# Patient Record
Sex: Male | Born: 1955
Health system: Southern US, Community
[De-identification: ages and names within clinical notes are randomized; demographics above are authoritative.]

## PROBLEM LIST (undated history)

## (undated) DIAGNOSIS — R7303 Prediabetes: Secondary | ICD-10-CM

## (undated) DIAGNOSIS — M1990 Unspecified osteoarthritis, unspecified site: Secondary | ICD-10-CM

## (undated) DIAGNOSIS — F1410 Cocaine abuse, uncomplicated: Secondary | ICD-10-CM

## (undated) DIAGNOSIS — I5022 Chronic systolic (congestive) heart failure: Secondary | ICD-10-CM

## (undated) DIAGNOSIS — I428 Other cardiomyopathies: Secondary | ICD-10-CM

## (undated) DIAGNOSIS — I499 Cardiac arrhythmia, unspecified: Secondary | ICD-10-CM

## (undated) DIAGNOSIS — I2510 Atherosclerotic heart disease of native coronary artery without angina pectoris: Secondary | ICD-10-CM

## (undated) DIAGNOSIS — R011 Cardiac murmur, unspecified: Secondary | ICD-10-CM

## (undated) DIAGNOSIS — I10 Essential (primary) hypertension: Secondary | ICD-10-CM

## (undated) DIAGNOSIS — I472 Ventricular tachycardia: Secondary | ICD-10-CM

## (undated) DIAGNOSIS — I509 Heart failure, unspecified: Secondary | ICD-10-CM

## (undated) DIAGNOSIS — N1831 Chronic kidney disease, stage 3a: Secondary | ICD-10-CM

## (undated) HISTORY — PX: CYSTECTOMY: SUR359

## (undated) HISTORY — DX: Essential (primary) hypertension: I10

## (undated) HISTORY — DX: Heart failure, unspecified: I50.9

## (undated) HISTORY — PX: TONSILLECTOMY: SUR1361

## (undated) HISTORY — PX: COLONOSCOPY: SHX174

## (undated) SURGICAL SUPPLY — 1 items: POUCH AIGIS-R ANTIBACT ICD (Mesh General) ×1 IMPLANT

---
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
## 1999-01-28 ENCOUNTER — Encounter: Payer: Self-pay | Admitting: Emergency Medicine

## 1999-01-28 ENCOUNTER — Emergency Department (HOSPITAL_COMMUNITY): Admission: EM | Admit: 1999-01-28 | Discharge: 1999-01-28 | Payer: Self-pay | Admitting: Emergency Medicine

## 2003-11-04 ENCOUNTER — Inpatient Hospital Stay (HOSPITAL_COMMUNITY): Admission: AC | Admit: 2003-11-04 | Discharge: 2003-11-05 | Payer: Self-pay

## 2003-11-11 ENCOUNTER — Encounter: Admission: RE | Admit: 2003-11-11 | Discharge: 2003-11-11 | Payer: Self-pay | Admitting: Family Medicine

## 2004-09-02 ENCOUNTER — Emergency Department (HOSPITAL_COMMUNITY): Admission: EM | Admit: 2004-09-02 | Discharge: 2004-09-02 | Payer: Self-pay | Admitting: Emergency Medicine

## 2004-11-22 ENCOUNTER — Emergency Department (HOSPITAL_COMMUNITY): Admission: EM | Admit: 2004-11-22 | Discharge: 2004-11-22 | Payer: Self-pay | Admitting: Emergency Medicine

## 2010-12-31 ENCOUNTER — Emergency Department (HOSPITAL_COMMUNITY): Payer: Self-pay

## 2010-12-31 ENCOUNTER — Emergency Department (HOSPITAL_COMMUNITY)
Admission: EM | Admit: 2010-12-31 | Discharge: 2010-12-31 | Disposition: A | Discharge status: Home / Self Care | Payer: Self-pay | Attending: Emergency Medicine | Admitting: Emergency Medicine

## 2010-12-31 DIAGNOSIS — M1990 Unspecified osteoarthritis, unspecified site: Secondary | ICD-10-CM | POA: Insufficient documentation

## 2010-12-31 DIAGNOSIS — M545 Low back pain, unspecified: Secondary | ICD-10-CM | POA: Insufficient documentation

## 2010-12-31 DIAGNOSIS — M79609 Pain in unspecified limb: Secondary | ICD-10-CM | POA: Insufficient documentation

## 2010-12-31 DIAGNOSIS — I10 Essential (primary) hypertension: Secondary | ICD-10-CM | POA: Insufficient documentation

## 2010-12-31 DIAGNOSIS — M5430 Sciatica, unspecified side: Secondary | ICD-10-CM | POA: Insufficient documentation

## 2010-12-31 LAB — POCT I-STAT, CHEM 8
BUN: 9 mg/dL (ref 6–23)
Calcium, Ion: 1.18 mmol/L (ref 1.12–1.32)
Chloride: 104 mEq/L (ref 96–112)
Creatinine, Ser: 1.2 mg/dL (ref 0.4–1.5)
Glucose, Bld: 93 mg/dL (ref 70–99)
HCT: 53 % — ABNORMAL HIGH (ref 39.0–52.0)
Hemoglobin: 18 g/dL — ABNORMAL HIGH (ref 13.0–17.0)
Potassium: 4.2 mEq/L (ref 3.5–5.1)
Sodium: 141 mEq/L (ref 135–145)
TCO2: 28 mmol/L (ref 0–100)

## 2010-12-31 LAB — URINALYSIS, ROUTINE W REFLEX MICROSCOPIC
Bilirubin Urine: NEGATIVE
Glucose, UA: NEGATIVE mg/dL
Ketones, ur: NEGATIVE mg/dL
Leukocytes, UA: NEGATIVE
Nitrite: NEGATIVE
Protein, ur: NEGATIVE mg/dL
Specific Gravity, Urine: 1.017 (ref 1.005–1.030)
Urobilinogen, UA: 0.2 mg/dL (ref 0.0–1.0)
pH: 6 (ref 5.0–8.0)

## 2010-12-31 LAB — URINE MICROSCOPIC-ADD ON

## 2011-03-28 ENCOUNTER — Encounter: Payer: Self-pay | Admitting: Internal Medicine

## 2011-03-28 ENCOUNTER — Ambulatory Visit (INDEPENDENT_AMBULATORY_CARE_PROVIDER_SITE_OTHER): Payer: Self-pay | Admitting: Internal Medicine

## 2011-03-28 VITALS — BP 184/118 | HR 66 | Temp 97.2°F | Ht 72.0 in | Wt 264.2 lb

## 2011-03-28 DIAGNOSIS — M79604 Pain in right leg: Secondary | ICD-10-CM | POA: Insufficient documentation

## 2011-03-28 DIAGNOSIS — M79606 Pain in leg, unspecified: Secondary | ICD-10-CM

## 2011-03-28 DIAGNOSIS — N529 Male erectile dysfunction, unspecified: Secondary | ICD-10-CM | POA: Insufficient documentation

## 2011-03-28 DIAGNOSIS — M79609 Pain in unspecified limb: Secondary | ICD-10-CM

## 2011-03-28 DIAGNOSIS — I10 Essential (primary) hypertension: Secondary | ICD-10-CM | POA: Insufficient documentation

## 2011-03-28 DIAGNOSIS — Z0000 Encounter for general adult medical examination without abnormal findings: Secondary | ICD-10-CM

## 2011-03-28 LAB — LIPID PANEL
Cholesterol: 149 mg/dL (ref 0–200)
HDL: 37 mg/dL — ABNORMAL LOW (ref >39)
LDL Cholesterol: 97 mg/dL (ref 0–99)
Total CHOL/HDL Ratio: 4 Ratio
Triglycerides: 76 mg/dL (ref <150)
VLDL: 15 mg/dL (ref 0–40)

## 2011-03-28 MED ORDER — HYDROCHLOROTHIAZIDE 25 MG PO TABS: 25.0000 mg | ORAL_TABLET | Freq: Every day | ORAL | Status: DC

## 2011-03-28 MED ORDER — CELECOXIB 100 MG PO CAPS: 100.0000 mg | ORAL_CAPSULE | Freq: Two times a day (BID) | ORAL | Status: DC

## 2011-03-28 MED ORDER — SILDENAFIL CITRATE 50 MG PO TABS: 50.0000 mg | ORAL_TABLET | ORAL | Status: DC | PRN

## 2011-03-28 NOTE — Assessment & Plan Note (Signed)
Per patient he has been on medication in the past, and his blood pressure has been as high as in the 200s in the past. He's not experiencing in seeing any headache or strokelike symptoms. I did start HCTZ 25 mg daily today. His blood pressure today was 184/116. We will see him back in 2 weeks for recheck. At recheck we'll check his blood pressure, basic metabolic panel.

## 2011-03-28 NOTE — Assessment & Plan Note (Signed)
Patient does state that he does have difficulty in achieving erection, however he does achieve erection he has no problems maintaining an erection for an appropriate amount of time. I will prescribe Viagra 50 mg today for him to trial and if it is succeeding for him then we will continue this for an as-needed basis.

## 2011-03-28 NOTE — Progress Notes (Signed)
Subjective:    Patient ID: Edward Mullen, male    DOB: [birth year], 55 y.o.   MRN: [id number]  HPI: The patient is a 55 year old male, who is new to our clinic today. He states that he has not seen a doctor in 20+ years. He states that he has had high blood pressure in the past that was treated with medication, however he has not been on medication for many years. He states that he takes no medication at this time. He does take over-the-counter BCs which apparently are like Goody powder. He also takes extra strength Tylenol 4-5 per day. He is having some muscular pain in his legs. The pain is on the posterior aspect of both calves as well as the upper legs bilaterally. He is a Special educational needs teacher, and this pain has been going on for 2 years approximately. He states that it's been keeping him from working for about the last year or so, but he does do occasional moving jobs when he has remissions of the pain. He states that the Tylenol, BCs do seem to help with the pain may take it away, but he still has this cramping sensation. He states that warm showers seem to help the muscles. He denies any headaches, strokelike symptoms. Denies any chest pain. Denies any abdominal pain. States that occasionally he has problems achieving erection, however if he is able to achieve erection he has no problems with losing an erection. He has no troubles urinating, no burning on urination. He does not have increased frequency of urination. He has been married for 30 years to his wife, and they have grandchildren and a great-grandchild. His wife does have some heart problems, she did run a home for girls. He cannot member when his last tetanus shot was. He claims he has no allergies to any medications. He states he is never had a colonoscopy. He is a current half pack a day smoker. Although states he was at one point smoking up to a pack per day. He has been smoking since the age of 27. His wife is also a smoker, and she states  that she has been asked to quit by her doctor due to her heart. He has been cutting back recently, and I did encourage this and tell him to keep cutting back. He has not quit smoking at this time, and I told him to discuss this with his wife and to possibly trying quit together. Patient states that he has been trying to lose weight in the past 6 months or so and has gone from 272 pounds to 264 pounds today. I did encourage this and congratulate him, I did advise him to continue losing weight.  PMH: Hypertension  Family medical history:  mother died of stroke around age 96 Father had heart attack at around age 21  Allergies: No known drug allergies  Medications: No medications Extra strength Tylenol 4-5 pills per day BC powder 2 packs per week (offbrand Goody powder?)  Substances: Approximately 20-25-pack-year smoking history No drug use Social alcohol use (approximately 2-3 drinks per week)  Review of Systems  Constitutional: Negative for fever, chills, diaphoresis, activity change, appetite change, fatigue and unexpected weight change.       Patient is trying to lose weight, and has gone down from 272 to 264 pounds in the past 3-6 months.  HENT: Negative.   Eyes: Negative.   Respiratory: Negative for apnea, cough, choking, chest tightness, shortness of breath, wheezing and stridor.  Cardiovascular: Negative for chest pain, palpitations and leg swelling.  Gastrointestinal: Negative for nausea, vomiting, abdominal pain, diarrhea, constipation and abdominal distention.  Genitourinary: Negative for dysuria, urgency, frequency, hematuria, flank pain, enuresis, difficulty urinating and genital sores.  Musculoskeletal: Positive for myalgias. Negative for back pain, joint swelling, arthralgias and gait problem.       Patient does walk with a cane. Patient is having cramping muscle pains in his bilateral calves and legs.  Skin: Negative.   Neurological: Negative.   Hematological: Negative.    Psychiatric/Behavioral: Negative.     Vitals: Blood pressure: 184/116 Pulse 66 Temperature 97.58F Oxygen saturation 98% on room air     Objective:   Physical Exam  Constitutional: He is oriented to person, place, and time. He appears well-developed and well-nourished.  HENT:  Head: Normocephalic and atraumatic.  Eyes: EOM are normal. Pupils are equal, round, and reactive to light.  Neck: Normal range of motion. Neck supple. No tracheal deviation present. No thyromegaly present.  Cardiovascular: Normal rate, regular rhythm and normal heart sounds.  Exam reveals no gallop and no friction rub.   No murmur heard. Pulmonary/Chest: Effort normal and breath sounds normal. No respiratory distress. He has no wheezes. He has no rales. He exhibits no tenderness.  Abdominal: Soft. Bowel sounds are normal. He exhibits no distension and no mass. There is no tenderness. There is no rebound and no guarding.  Musculoskeletal: Normal range of motion. He exhibits no edema and no tenderness.       Patient does have pain in his legs with movement, however they are not tender to touch. There is no redness or swelling in the calves.  Lymphadenopathy:    He has no cervical adenopathy.  Neurological: He is alert and oriented to person, place, and time. No cranial nerve deficit.  Skin: Skin is warm and dry. No rash noted. No erythema. No pallor.  Psychiatric: He has a normal mood and affect. His behavior is normal. Judgment and thought content normal.          Assessment & Plan:  1. Please see problem-oriented charting.  2. Disposition-we will see patient back in 2 weeks so she can follow up with me on the last day of clinic for the month. At this point we will check his blood pressure, pain management, erectile dysfunction. Today we are prescribing HCTZ 25 mg daily, Viagra 50 mg as needed, Celebrex 100 mg twice daily. I have also given him stretching exercises for his legs, and advised him to use heat  and cold packs to decrease inflammation and the legs. I did advise him on smoking cessation and gave him information about that. I talked to him about colonoscopy, and he would like to think about that and talk to his wife at this time. I did give him information about colon cancer screening. We are checking a fasting lipid panel at this visit. It comes back abnormal we'll add possible medications at next visit. We'll also check a basic metabolic panel at next visit to ensure the kidney function, potassium are stable. Also check to see if his blood pressure is decreasing. I have informed patient that if he does have increasing or worsening pain he should feel free to call our clinic, and he is free to call the clinic for any questions.

## 2011-03-28 NOTE — Patient Instructions (Signed)
You were seen today as a new patient to our clinic. Your doctor is Dr. Dorise Hiss. We are going to prescribe viagra for your problems with getting an erection. If it works for your then we can continue it, if not we can try something else. We are also going to give you HCTZ (hydrochlorothiazide) 25 mg take one pill daily to help with your blood pressure. Please try these stretches I'll give you and you can use heat and cold packs on your legs. I will prescribe celebrex (celecoxib) 100 mg that you can take 2 times daily for 2 weeks to see if it helps to calm down the muscles. Please take it twice a day for 2 weeks even if you are not having pain. We will see you back in 2 weeks to check on your pain and your blood pressure. Think about cutting back or quitting smoking. This is one of the best things you can do for your body! You also have information about colonoscopy. Please talk to your wife about it and we will discuss it again at a later visit. If you are having worsening pain or need to be seen sooner or have a question don't hesitate to call our clinic. Our number is [phone].  Muscle Strain / Pulled Muscle A pulled muscle, or muscle strain, occurs when a muscle is over-stretched. A small number of muscle fibers may also be torn. This is especially common in athletes. This happens when a sudden violent force placed on a muscle pushes it past its capacity. Usually, recovery from a pulled muscle takes 1 to 2 weeks. But complete healing will take 5 to 6 weeks. There are millions of muscle fibers. Following injury, your body will usually return to normal quickly. HOME CARE INSTRUCTIONS  While awake, apply ice to the sore muscle for 25 minutes each hour for the first 2 days. Put ice in a plastic bag and place a towel between the bag of ice and your skin.   Do not use the pulled muscle for several days. Do not use the muscle if you have pain.   You may wrap the injured area with an elastic bandage for  comfort. Be careful not to bind it too tightly. This may interfere with blood circulation.   Only take over-the-counter or prescription medicines for pain, discomfort, or fever as directed by your caregiver. Do not use aspirin as this will increase bleeding (bruising) at injury site.   Warming up before exercise helps prevent muscle strains.  SEEK MEDICAL CARE IF: There is increased pain or swelling in the affected area. MAKE SURE YOU:   Understand these instructions.   Will watch your condition.   Will get help right away if you are not doing well or get worse.  Document Released: 07/29/2005 Document Re-Released: 10/23/2009 [facility] Patient Information 2011 [place], [place].   Leg Cramps Leg cramps that occur during exercise can be caused by poor circulation. However, muscle cramps that occur at rest or during the night are usually not due to any serious medical problem. Heat cramps may cause muscle spasms during hot weather.  CAUSE There is no clear cause for muscle cramps. However, dehydration may be a factor for those who exercise in the heat. Imbalances in the level of sodium, potassium, calcium or magnesium in the muscle tissue may also be a factor. TREATMENT  Make sure your diet has enough fluids and essential minerals for the muscle to work normally.   Avoid strenuous exercise for several  days if you have been having frequent leg cramps.   Stretch and massage the cramped muscle for several minutes.   Some medicines may be helpful in some patients with night cramps. Only take over-the-counter or prescription medicines as directed by your caregiver.  SEEK IMMEDIATE MEDICAL CARE IF:  Your leg cramps become worse.   Your foot becomes cold, numb or blue.  Document Released: 09/05/2004 Document Re-Released: 10/25/2008 [facility] Patient Information 2011 [place], [place].  Knee Exercises   RANGE OF MOTION AND STRETCHING EXERCISES These exercises may help you when beginning to  rehabilitate your injury. Your symptoms may resolve with or without further involvement from your physician, physical therapist or athletic trainer. While completing these exercises, remember:   Restoring tissue flexibility helps normal motion to return to the joints. This allows healthier, less painful movement and activity.  An effective stretch should be held for at least 30 seconds.  A stretch should never be painful. You should only feel a gentle lengthening or release in the stretched tissue.      STRETCH - Knee Extension, Prone  Lie on your stomach on a firm surface, such as a bed or countertop.  Place your __________ knee and leg just beyond the edge of the surface. You may wish to place a towel under the far end of your __________ thigh for comfort.  Relax your leg muscles and allow gravity to straighten your knee. Your clinician may advise you to add an ankle weight if more resistance is helpful for you.  You should feel a stretch in the back of your __________ knee. Hold this position for __________ seconds. Repeat __________ times. Complete this stretch __________ times per day.   * Your physician, physical therapist or athletic trainer may ask you to add ankle weight to enhance your stretch.       RANGE OF MOTION - Knee Flexion, Active  Lie on your back with both knees straight. (If this causes back discomfort, bend your opposite knee, placing your foot flat on the floor.)  Slowly slide your heel back toward your buttocks until you feel a gentle stretch in the front of your knee or thigh.   Hold for __________ seconds. Slowly slide your heel back to the starting position. Repeat __________ times. Complete this exercise __________ times per day.      STRETCH - Quadriceps, Prone   Lie on your stomach on a firm surface, such as a bed or padded floor.  Bend your __________ knee and grasp your ankle. If you are unable to reach, your ankle or pant leg, use a belt around your  foot to lengthen your reach.  Gently pull your heel toward your buttocks. Your knee should not slide out to the side. You should feel a stretch in the front of your thigh and/or knee.  Hold this position for __________ seconds.   Repeat __________ times. Complete this stretch __________ times per day.     STRETCH - Hamstrings, Supine   Lie on your back. Loop a belt or towel over the ball of your __________ foot.  Straighten your __________ knee and slowly pull on the belt to raise your leg.  Do not allow the __________ knee to bend. Keep your opposite leg flat on the floor.  Raise the leg until you feel a gentle stretch behind your __________ knee or thigh. Hold this position for __________ seconds.  Repeat __________ times. Complete this stretch __________ times per day.      STRENGTHENING  EXERCISES  These exercises may help you when beginning to rehabilitate your injury.  They may resolve your symptoms with or without further involvement from your physician, physical therapist or athletic trainer. While completing these exercises, remember:   Muscles can gain both the endurance and the strength needed for everyday activities through controlled exercises.  Complete these exercises as instructed by your physician, physical therapist or athletic trainer.  Progress the resistance and repetitions only as guided.  You may experience muscle soreness or fatigue, but the pain or discomfort you are trying to eliminate should never worsen during these exercises.  If this pain does worsen, stop and make certain you are following the directions exactly.  If the pain is still present after adjustments, discontinue the exercise until you can discuss the trouble with your clinician.     STRENGTH - Quadriceps, Isometrics  Lie on your back with your __________ leg extended and your opposite knee bent.  Gradually tense the muscles in the front of your __________ thigh. You should see either your knee  cap slide up toward your hip or increased dimpling just above the knee. This motion will push the back of the knee down toward the floor/mat/bed on which you are lying.    Hold the muscle as tight as you can without increasing your pain for __________ seconds.  Relax the muscles slowly and completely in between each repetition. Repeat __________ times. Complete this exercise __________ times per day.     STRENGTH - Quadriceps, Short Arcs   Lie on your back.  Place a __________ inch towel roll under your knee so that the knee slightly bends.  Raise only your lower leg by tightening the muscles in the front of your thigh. Do not allow your thigh to rise.  Hold this position for __________ seconds. Repeat __________ times.  Complete this exercise __________ times per day.     OPTIONAL ANKLE WEIGHTS:  Begin with __________ pounds, but DO NOT exceed ____________________ pounds.  Increase in 1 pound increments.      STRENGTH - Quadriceps, Straight Leg Raises  Quality counts!  Watch for signs that the quadriceps muscle is working to insure you are strengthening the correct muscles and not "cheating" by substituting with healthier muscles.  Lay on your back with your __________ leg extended and your opposite knee bent.    Tense the muscles in the front of your __________ thigh. You should see either your knee cap slide up or increased dimpling just above the knee. Your thigh may even quiver.  Tighten these muscles even more and raise your leg 4 to 6 inches off the floor. Hold for __________ seconds.  Keeping these muscles tense, lower your leg.  Relax the muscles slowly and completely in between each repetition. Repeat __________ times.  Complete this exercise __________ times per day.     STRENGTH - Hamstring, Curls   Lay on your stomach with your legs extended. (If you lay on a bed, your feet may hang over the edge.)  Tighten the muscles in the back of your thigh to bend your  __________ knee up to 90 degrees. Keep your hips flat on the bed/floor.  Hold this position for __________ seconds.  Slowly lower your leg back to the starting position. Repeat __________ times. Complete this exercise __________ times per day.    OPTIONAL ANKLE WEIGHTS:  Begin with __________ pounds, but DO NOT exceed ____________________ pounds.  Increase in 1 pound increments.  STRENGTH - Quadriceps, Squats  Stand in a door frame so that your feet and knees are in line with the frame.  Use your hands for balance, not support, on the frame.  Slowly lower your weight, bending at the hips and knees. Keep your lower legs upright so that they are parallel with the door frame. Squat only within the range that does not increase your knee pain. Never let your hips drop below your knees.    Slowly return upright, pushing with your legs, not pulling with your hands. Repeat __________ times. Complete this exercise __________ times per day.    STRENGTH - Quadriceps, Wall Slides    Follow guidelines for form closely.  Increased knee pain often results from poorly placed feet or knees.    Lean against a smooth wall or door and walk your feet out 18-24 inches. Place your feet hip-width apart.  Slowly slide down the wall or door until your knees bend __________ degrees.* Keep your knees over your heels, not your toes, and in line with your hips, not falling to either side.  Hold for __________ seconds. Stand up to rest for __________ seconds in between each repetition.  Repeat __________ times. Complete this exercise __________ times per day.   * Your physician, physical therapist or athletic trainer will alter this angle based on your symptoms and progress.   Document Released: 06/12/2005  Document Re-Released: 08/20/2009 [facility] Patient Information 2011 [place], [place].   Colorectal Cancer Screening Colorectal cancer screening is done to detect early disease. Colorectal refers to  the colon and rectum. The colon and rectum are located at the end of the large intestine (digestive system), and carry your bowel movements out of the body. Screening may be done even if you are not experiencing symptoms.  Colorectal cancer screening checks for:  Polyps. These are small growths in the lining of the colon that can turn cancerous.   Cancer that is already growing. Cancer is a cluster of abnormal cells that can cause problems in the body.  REASONS FOR COLORECTAL CANCER SCREENING  It is common for polyps to form in the lining of the colon, especially in older people. These polyps can be cancerous or become cancerous.   Caught early, colorectal cancer is treatable.   Cancer can be life threatening. Detecting or preventing cancer early can save your life and allow you to enjoy life longer.  TYPES OF SCREENING  Fecal occult blood testing. A stool sample is examined for blood in the laboratory.   Sigmoidoscopy. A sigmoidoscope is used to examine the rectum and lower colon. A sigmoidoscope is a flexible tube with a camera that is inserted through your anus to examine your lower rectum.   Colonoscopy. The longer colonoscope is used to examine the entire colon. A colonoscope is also a thin, flexible tube with a camera. This test examines the colon and rectum.  Other tests include:  Digital rectal exam.   Barium enema.   Stool DNA test.   Virtual colonoscopy is the use of computerized X-ray scan (computed tomography, CT) to take X-ray images of your colon.  WHO SHOULD HAVE COLORECTAL CANCER SCREENING?  Screening is recommended for all adults aged 15 to 75 years.  Screening is generally done every 5 to 10 years or more frequently if you have a family history or symptoms.  Screening is rarely recommended in adults aged 61 to 85 years. Screening is not recommended in adults aged 50 years and older. Your caregiver may  recommend screening at a younger age and more frequent screening  if you have:  A history of colorectal cancer or polyps.   Family members with histories of colorectal cancer or polyps.   Inflammatory bowel disease, such as ulcerative colitis or Crohn's disease.   A type of hereditary colon cancer syndrome.  Talk with your caregiver about any symptoms, personal and family history. SYMPTOMS OF COLORECTAL CANCER It is important to discuss the following symptoms with your caregiver. These symptoms may be the result of other conditions and may be easily treated:  Rectal bleeding.   Blood in your stool.   Changes in bowel movements (hard or loose stools). These changes may last several weeks.   Abdominal cramping.   Feeling the pressure to have a bowel movement when there is no bowel movement.   Feeling tired or weak.   Unexplained weight loss.   Unexplained low red blood cell count. This may also be called iron deficiency anemia.  HOME CARE INSTRUCTIONS   Follow up with your caregiver as directed.   Follow all instructions for preparation before your test as well as after.  PREVENTION  Following healthy lifestyle habits each day can reduce your chance of getting colorectal cancer and many other types of cancer:  Eat a healthy, well-balanced diet rich in fruits and vegetables and low in fats, sugars and cholesterol.   Stay active. Try to exercise at least 4 to 6 times per week for 30 minutes.   Maintain a healthy weight. Ask your caregiver what a healthy weight range is for you.   Women should only drink 1 alcoholic drink per day. Men should only drink 2 alcoholic drinks per day.   Quit smoking.  SEEK MEDICAL CARE IF:  You experience abdominal or rectal symptoms (see Symptoms of Colorectal Cancer).   Your gastrointestinal issues (constipation, diarrhea) do not go away as expected.   You have questions or concerns.  FOR FURTHER INFORMATION  American Academy of Family Physicians www.familydoctor.org   Centers for Disease Control and  Prevention [employer]br   [place] Preventive Services Task Force www.uspreventiveservicestaskforce.org   American Cancer Society www.cancer.org  MAKE SURE YOU:   Understand these instructions.   Will watch your condition.   Will get help right away if you are not doing well or get worse.  Always follow up with your caregiver to find out the results of your tests. Not all test results may be available during your visit. If your test results are not back during the visit, make an appointment with your caregiver to find out the results. Do not assume everything is normal if you have not heard from your caregiver or the medical facility. It is important for you to follow up on all of your test results.  Document Released: 01/16/2010  [facility] Patient Information 2011 [place], [place].Smoking Cessation This document explains the best ways for you to quit smoking and new treatments to help. It lists new medicines that can double or triple your chances of quitting and quitting for good. It also considers ways to avoid relapses and concerns you may have about quitting, including weight gain. NICOTINE: A POWERFUL ADDICTION If you have tried to quit smoking, you know how hard it can be. It is hard because nicotine is a very addictive drug. For some people, it can be as addictive as heroin or cocaine. Usually, people make 2 or 3 tries, or more, before finally being able to quit. Each time you try to quit, you can  learn about what helps and what hurts. Quitting takes hard work and a lot of effort, but you can quit smoking. QUITTING SMOKING IS ONE OF THE MOST IMPORTANT THINGS YOU WILL EVER DO:  You will live longer, feel better, and live better.   The impact on your body of quitting smoking is felt almost immediately:   Within 20 minutes, blood pressure decreases. Pulse returns to its normal level.   After 8 hours, carbon monoxide levels in the blood return to normal. Oxygen level increases.   After 24  hours, chance of heart attack starts to decrease. Breath, hair, and body stop smelling like smoke.   After 48 hours, damaged nerve endings begin to recover. Sense of taste and smell improve.   After 72 hours, the body is virtually free of nicotine. Bronchial tubes relax and breathing becomes easier.   After 2 to 12 weeks, lungs can hold more air. Exercise becomes easier and circulation improves.   Quitting will lower your chance of having a heart attack, stroke, cancer, or lung disease:   After 1 year, the risk of coronary heart disease is cut in half.   After 5 years, the risk of stroke falls to the same as a nonsmoker.   After 10 years, the risk of lung cancer is cut in half and the risk of other cancers decreases significantly.   After 15 years, the risk of coronary heart disease drops, usually to the level of a nonsmoker.   If you are pregnant, quitting smoking will improve your chances of having a healthy baby.   The people you live with, especially your children, will be healthier.   You will have extra money to spend on things other than cigarettes.  FIVE KEYS TO QUITTING Studies have shown that these 5 steps will help you quit smoking and quit for good. You have the best chances of quitting if you use them together: 1. Get ready.  2. Get support and encouragement.  3. Learn new skills and behaviors.  4. Get medicine to reduce your nicotine addiction and use it correctly.  5. Be prepared for relapse or difficult situations. Be determined to continue trying to quit, even if you do not succeed at first.  1. GET READY  Set a quit date.   Change your environment.   Get rid of ALL cigarettes, ashtrays, matches, and lighters in your home, car, and place of work.   Do not let people smoke in your home.   Review your past attempts to quit. Think about what worked and what did not.   Once you quit, do not smoke. NOT EVEN A PUFF!  2. GET SUPPORT AND ENCOURAGEMENT Studies have  shown that you have a better chance of being successful if you have help. You can get support in many ways.  Tell your family, friends, and coworkers that you are going to quit and need their support. Ask them not to smoke around you.   Talk to your caregivers (doctor, dentist, nurse, pharmacist, psychologist, and/or smoking counselor).   Get individual, group, or telephone counseling and support. The more counseling you have, the better your chances are of quitting. Programs are available at [employer] and health centers. Call your local health department for information about programs in your area.   Spiritual beliefs and practices may help some smokers quit.   Quit meters are Photographer that keep track of quit statistics, such as amount of "quit-time," cigarettes not  smoked, and money saved.   Many smokers find one or more of the many self-help books available useful in helping them quit and stay off tobacco.  3. LEARN NEW SKILLS AND BEHAVIORS  Try to distract yourself from urges to smoke. Talk to someone, go for a walk, or occupy your time with a task.   When you first try to quit, change your routine. Take a different route to work. Drink tea instead of coffee. Eat breakfast in a different place.   Do something to reduce your stress. Take a hot bath, exercise, or read a book.   Plan something enjoyable to do every day. Reward yourself for not smoking.   Explore interactive web-based programs that specialize in helping you quit.  4. GET MEDICINE AND USE IT CORRECTLY Medicines can help you stop smoking and decrease the urge to smoke. Combining medicine with the above behavioral methods and support can quadruple your chances of successfully quitting smoking. The [place] Food and Drug Administration (FDA) has approved 7 medicines to help you quit smoking. These medicines fall into 3 categories.  Nicotine replacement therapy (delivers nicotine to your  body without the negative effects and risks of smoking):   Nicotine gum: Available over-the-counter.   Nicotine lozenges: Available over-the-counter.   Nicotine inhaler: Available by prescription.   Nicotine nasal spray: Available by prescription.   Nicotine skin patches (transdermal): Available by prescription and over-the-counter.   Antidepressant medicine (helps people abstain from smoking, but how this works is unknown):   Bupropion sustained-release (SR) tablets: Available by prescription.   Nicotinic receptor partial agonist (simulates the effect of nicotine in your brain):   Varenicline tartrate tablets: Available by prescription.   Ask your caregiver for advice about which medicines to use and how to use them. Carefully read the information on the package.   Everyone who is trying to quit may benefit from using a medicine. If you are pregnant or trying to become pregnant, nursing an infant, you are under age 48, or you smoke fewer than 10 cigarettes per day, talk to your caregiver before taking any nicotine replacement medicines.   You should stop using a nicotine replacement product and call your caregiver if you experience nausea, dizziness, weakness, vomiting, fast or irregular heartbeat, mouth problems with the lozenge or gum, or redness or swelling of the skin around the patch that does not go away.   Do not use any other product containing nicotine while using a nicotine replacement product.   Talk to your caregiver before using these products if you have diabetes, heart disease, asthma, stomach ulcers, you had a recent heart attack, you have high blood pressure that is not controlled with medicine, a history of irregular heartbeat, or you have been prescribed medicine to help you quit smoking.  5. BE PREPARED FOR RELAPSE OR DIFFICULT SITUATIONS  Most relapses occur within the first 3 months after quitting. Do not be discouraged if you start smoking again. Remember, most  people try several times before they finally quit.   You may have symptoms of withdrawal because your body is used to nicotine. You may crave cigarettes, be irritable, feel very hungry, cough often, get headaches, or have difficulty concentrating.   The withdrawal symptoms are only temporary. They are strongest when you first quit, but they will go away within 10 to 14 days.  Here are some difficult situations to watch for:  Alcohol. Avoid drinking alcohol. Drinking lowers your chances of successfully quitting.   Caffeine. Try  to reduce the amount of caffeine you consume. It also lowers your chances of successfully quitting.   Other smokers. Being around smoking can make you want to smoke. Avoid smokers.   Weight gain. Many smokers will gain weight when they quit, usually less than 10 pounds. Eat a healthy diet and stay active. Do not let weight gain distract you from your main goal, quitting smoking. Some medicines that help you quit smoking may also help delay weight gain. You can always lose the weight gained after you quit.   Bad mood or depression. There are a lot of ways to improve your mood other than smoking.  If you are having problems with any of these situations, talk to your caregiver. SPECIAL SITUATIONS OR CONDITIONS Studies suggest that everyone can quit smoking. Your situation or condition can give you a special reason to quit.  Pregnant women/New mothers: By quitting, you protect your baby's health and your own.   Hospitalized patients: By quitting, you reduce health problems and help healing.   Heart attack patients: By quitting, you reduce your risk of a second heart attack.   Lung, head, and neck cancer patients: By quitting, you reduce your chance of a second cancer.   Parents of children and adolescents: By quitting, you protect your children from illnesses caused by secondhand smoke.  QUESTIONS TO THINK ABOUT Think about the following questions before you try to  stop smoking. You may want to talk about your answers with your caregiver.  Why do you want to quit?   If you tried to quit in the past, what helped and what did not?   What will be the most difficult situations for you after you quit? How will you plan to handle them?   Who can help you through the tough times? Your family? Friends? Caregiver?   What pleasures do you get from smoking? What ways can you still get pleasure if you quit?  Here are some questions to ask your caregiver:  How can you help me to be successful at quitting?   What medicine do you think would be best for me and how should I take it?   What should I do if I need more help?   What is smoking withdrawal like? How can I get information on withdrawal?  Quitting takes hard work and a lot of effort, but you can quit smoking. FOR MORE INFORMATION Smokefree.gov (http://www.davis-sullivan.com/) provides free, accurate, evidence-based information and professional assistance to help support the immediate and long-term needs of people trying to quit smoking. Document Released: 07/23/2001 Document Re-Released: 01/16/2010 [facility] Patient Information 2011 [place], [place].

## 2011-03-28 NOTE — Progress Notes (Signed)
I agree with the assessment and plan as per Dr. Dorise Hiss.

## 2011-03-28 NOTE — Assessment & Plan Note (Addendum)
Per patient this has been on for a couple years, and he does work as a Special educational needs teacher. He has been unable to work for the past year or so. He does state that the pain is relieved by aspirin, extra strength Tylenol, goody powder like substance. I am prescribing Celebrex 100 mg twice daily today. I have called the patient and told him to hold off on taking this until next visit as it can elevate blood pressure slightly and will instruct him to continue using tylenol or goody powder until next visit in 2 weeks. I have also advised him on stretching exercises, the use of heat pads and cold pads. At two-week followup we will check on his pain. However he is still able to do a lot of his daily activities.

## 2011-04-12 ENCOUNTER — Encounter: Payer: Self-pay | Admitting: Internal Medicine

## 2011-04-22 ENCOUNTER — Encounter: Payer: Self-pay | Admitting: Internal Medicine

## 2011-04-22 ENCOUNTER — Ambulatory Visit (INDEPENDENT_AMBULATORY_CARE_PROVIDER_SITE_OTHER): Payer: Self-pay | Admitting: Internal Medicine

## 2011-04-22 VITALS — BP 191/133 | HR 89 | Temp 97.2°F | Ht 72.0 in | Wt 269.2 lb

## 2011-04-22 DIAGNOSIS — I10 Essential (primary) hypertension: Secondary | ICD-10-CM

## 2011-04-22 DIAGNOSIS — M79606 Pain in leg, unspecified: Secondary | ICD-10-CM

## 2011-04-22 DIAGNOSIS — M79609 Pain in unspecified limb: Secondary | ICD-10-CM

## 2011-04-22 LAB — BASIC METABOLIC PANEL
BUN: 8 mg/dL (ref 6–23)
CO2: 24 mEq/L (ref 19–32)
Calcium: 9.3 mg/dL (ref 8.4–10.5)
Chloride: 105 mEq/L (ref 96–112)
Creat: 1.22 mg/dL (ref 0.50–1.35)
Glucose, Bld: 69 mg/dL — ABNORMAL LOW (ref 70–99)
Potassium: 4 mEq/L (ref 3.5–5.3)
Sodium: 140 mEq/L (ref 135–145)

## 2011-04-22 MED ORDER — LISINOPRIL-HYDROCHLOROTHIAZIDE 20-12.5 MG PO TABS: 1.0000 | ORAL_TABLET | Freq: Every day | ORAL | Status: DC

## 2011-04-22 NOTE — Assessment & Plan Note (Signed)
Patient has a history of uncontrolled hypertension, presenting with asymptomatic uncontrolled hypertension.  Patient prescribed HCTZ at last visit, but did not fill prescription due to concern about cost of medication. -patient started on lisinopril/HCTZ.  Patient agrees to take this medication after he was told that it was on the $4 list

## 2011-04-22 NOTE — Patient Instructions (Signed)
Your blood pressure was highly elevated today.  We have prescribed a new medication, Lisinopril-HCTZ, for your blood pressure.  Take 1 pill per day -please return in about 1 month to re-check your blood pressure  Your leg pain may be due to a vascular cause -we have ordered an ABI (ankle-brachial index) study to evaluate the blood vessels in your leg, which will help [place] know how work towards relieving the pain -please see Rudell Cobb to get an [employer], to help you pay for this procedure -in the meantime, you may continue using BC powder for the pain  Please return for a follow-up appointment in 1 month

## 2011-04-22 NOTE — Assessment & Plan Note (Signed)
The patient notes a 34-month history of bilateral posterior upper and lower leg "cramping", which can occur at rest, but is worsened by walking >1-2 blocks, concerning for claudication (no documented history of CAD, PVD).  May also represent neuropathic pain given degenerative changes seen on x-ray 12/2010. -ordered ABI to evaluate claudication.  Results will guide further management -continue BC powder for pain relief -patient informed about how to apply for the [facility] Card to help pay for the ABI

## 2011-04-22 NOTE — Progress Notes (Signed)
HPI The patient is a 55 yo man, history of HTN and ED, presenting with leg pain.  The patient notes a 6 month history of leg pain, described as bilateral posterior upper and lower leg "cramping", which can occur at rest, but is worsened by walking more than 1-2 blocks, and subsequently partially relieved by rest.  The patient also notes some relief with BC powder (aspirin + caffeine).  The patient was seen for this complaint 4 months ago in the ED, where lumbar spine x-rays showed DDD and spondylosis at L4-5 and facet degenerative changes at L5-S1.  The patient was seen in [facility] 1 month ago for this complaint, and advised to use heat/cold packs and stretching, which he does not believe have significantly changed the pain.  The patient is still able to complete his activities of daily living, but notes that he has been unable to work for the last 6 months due to this pain.  The patient was also noted to have HTN at his last visit, and was prescribed HCTZ, but did not fill the prescription out of concern for the perceived cost of the medication.  The patient was surprised today when I told him that this medication costs only $4/month.  The patient's BP was 191/133 today, with no headache, nausea, vomiting, restlessness, or alteration of consciousness.  He notes that he has received treatment for hypertension in the past, but discontinued his medication 5 years ago due to bloating and weight gain.  ROS: General: no fevers, chills, changes in weight, changes in appetite Skin: no rash HEENT: no blurry vision, hearing changes, sore throat Pulm: no dyspnea, coughing, wheezing CV: no chest pain, palpitations, shortness of breath Abd: no abdominal pain, nausea/vomiting, diarrhea/constipation GU: no dysuria, hematuria, polyuria Ext: see HPI Neuro: no weakness, numbness, or tingling  Filed Vitals:   04/22/11 1333  BP: 191/133  Pulse: 89  Temp: 97.2 F (36.2 C)    PEX General: alert, cooperative, and in  no apparent distress HEENT: pupils equal round and reactive to light, vision grossly intact, fundi unremarkable with no retinal bleeding or papilledema noted, oropharynx clear and non-erythematous  Neck: supple, no lymphadenopathy, JVD, or carotid bruits Lungs: clear to ascultation bilaterally, normal work of respiration, no wheezes, rales, ronchi Heart: regular rate and rhythm, no murmurs, gallops, or rubs Abdomen: soft, non-tender, non-distended, normal bowel sounds Msk: no joint edema, warmth, or erythema Extremities: 2+ DP/PT pulses bilaterally, no cyanosis, clubbing, or edema Neurologic: alert & oriented X3, cranial nerves II-XII intact, strength 5/5 throughout bilateral upper and lower extremities, sensation intact to light touch  Assessment/Plan

## 2011-05-08 ENCOUNTER — Ambulatory Visit (HOSPITAL_COMMUNITY): Payer: Self-pay

## 2011-05-16 ENCOUNTER — Ambulatory Visit (HOSPITAL_COMMUNITY): Payer: Self-pay

## 2011-05-22 ENCOUNTER — Encounter: Payer: Self-pay | Admitting: Internal Medicine

## 2011-05-22 ENCOUNTER — Ambulatory Visit (INDEPENDENT_AMBULATORY_CARE_PROVIDER_SITE_OTHER): Payer: Self-pay | Admitting: Internal Medicine

## 2011-05-22 ENCOUNTER — Ambulatory Visit (HOSPITAL_COMMUNITY)
Admission: RE | Admit: 2011-05-22 | Discharge: 2011-05-22 | Disposition: A | Discharge status: Home / Self Care | Payer: Self-pay | Source: Ambulatory Visit | Attending: Cardiology | Admitting: Cardiology

## 2011-05-22 DIAGNOSIS — Z23 Encounter for immunization: Secondary | ICD-10-CM

## 2011-05-22 DIAGNOSIS — I10 Essential (primary) hypertension: Secondary | ICD-10-CM | POA: Insufficient documentation

## 2011-05-22 DIAGNOSIS — M79609 Pain in unspecified limb: Secondary | ICD-10-CM

## 2011-05-22 DIAGNOSIS — G8929 Other chronic pain: Secondary | ICD-10-CM

## 2011-05-22 LAB — LIPID PANEL
Cholesterol: 156 mg/dL (ref 0–200)
HDL: 31 mg/dL — ABNORMAL LOW (ref >39)
LDL Cholesterol: 104 mg/dL — ABNORMAL HIGH (ref 0–99)
Total CHOL/HDL Ratio: 5 Ratio
Triglycerides: 106 mg/dL (ref <150)
VLDL: 21 mg/dL (ref 0–40)

## 2011-05-22 MED ORDER — DILTIAZEM HCL ER 60 MG PO CP12: 60.0000 mg | ORAL_CAPSULE | Freq: Two times a day (BID) | ORAL | Status: DC

## 2011-05-22 MED ORDER — ACETAMINOPHEN 325 MG PO TABS: 650.0000 mg | ORAL_TABLET | ORAL | Status: AC | PRN

## 2011-05-22 NOTE — Progress Notes (Signed)
  Subjective:    Patient ID: Edward Mullen, male    DOB: 07/01/1956, 55 y.o.   MRN: [id number]  HPI 1. HTN. Patient states that he is compliant with his medication regimen. Denies any Sx. 2. Leg pain, chronic, burning; constant; worse with  Walking; slight improvement at rest.   Review of Systems  Constitutional: Positive for activity change. Negative for appetite change and unexpected weight change.  HENT: Negative for sneezing.   Eyes: Negative for visual disturbance.  Respiratory: Negative.   Cardiovascular: Negative.   Gastrointestinal: Negative.   Musculoskeletal: Positive for myalgias and arthralgias. Negative for back pain, joint swelling and gait problem.  Skin: Negative for rash and wound.  Neurological: Negative for syncope and weakness.  Hematological: Does not bruise/bleed easily.  Psychiatric/Behavioral: Negative for sleep disturbance.       Objective:   Physical Exam        Assessment & Plan:  1. HTN, uncotnrolled -add cardizem -continue with Zestoretic -Urine microalbumin -weight management -smoking cessation->referred to [employer]  2. Bilateral leg pain with Left>Right-? PVD -ABI -D/C Goody powder due to dyspepsia

## 2011-05-22 NOTE — Patient Instructions (Addendum)
Please, pick up your prescriptions at the pharmacy. Please, note --you have a new medication added to your current regimen: Diltiazem/cardizem. Please, return on Friday (05/24/11) for a blood pressure recheck. Please, do not use viagra until your blood pressure is under a better control. Please, make sure and see Ms. Hill for "orange card." Please, make a separate appointment with Ms. Georges Mouse for smoking cessation counseling. Please,follow up in 2 weeks or sooner and call with any questions.

## 2011-05-23 LAB — MICROALBUMIN / CREATININE URINE RATIO
Creatinine, Urine: 86.7 mg/dL
Microalb Creat Ratio: 5.8 mg/g (ref 0.0–30.0)
Microalb, Ur: 0.5 mg/dL (ref 0.00–1.89)

## 2011-05-24 ENCOUNTER — Ambulatory Visit: Payer: Self-pay | Admitting: Internal Medicine

## 2011-06-03 ENCOUNTER — Telehealth: Payer: Self-pay | Admitting: Licensed Clinical Social Worker

## 2011-06-03 NOTE — Telephone Encounter (Signed)
Both phone numbers not working.  Coming in Wed at 10:15 AM.

## 2011-06-05 ENCOUNTER — Ambulatory Visit: Payer: Self-pay | Admitting: Internal Medicine

## 2011-06-05 NOTE — Telephone Encounter (Signed)
Pt no show for doctor's appmt.  Sending resources for smoking cessation to the home.

## 2011-07-16 ENCOUNTER — Encounter: Payer: Self-pay | Admitting: *Deleted

## 2011-07-25 NOTE — Progress Notes (Signed)
Addended by: Neomia Dear on: 07/25/2011 04:37 PM   Modules accepted: Orders

## 2012-04-01 ENCOUNTER — Encounter: Payer: Self-pay | Admitting: Internal Medicine

## 2012-04-07 ENCOUNTER — Encounter: Payer: Self-pay | Admitting: Internal Medicine

## 2012-04-10 ENCOUNTER — Encounter: Payer: Self-pay | Admitting: Internal Medicine

## 2012-05-15 ENCOUNTER — Encounter: Payer: Self-pay | Admitting: Internal Medicine

## 2012-05-15 ENCOUNTER — Ambulatory Visit (INDEPENDENT_AMBULATORY_CARE_PROVIDER_SITE_OTHER): Payer: Self-pay | Admitting: Internal Medicine

## 2012-05-15 VITALS — BP 178/104 | HR 88 | Temp 97.9°F | Ht 72.0 in | Wt 244.9 lb

## 2012-05-15 DIAGNOSIS — Z23 Encounter for immunization: Secondary | ICD-10-CM

## 2012-05-15 DIAGNOSIS — I10 Essential (primary) hypertension: Secondary | ICD-10-CM

## 2012-05-15 DIAGNOSIS — M79606 Pain in leg, unspecified: Secondary | ICD-10-CM

## 2012-05-15 DIAGNOSIS — M79609 Pain in unspecified limb: Secondary | ICD-10-CM

## 2012-05-15 LAB — BASIC METABOLIC PANEL WITH GFR
BUN: 15 mg/dL (ref 6–23)
CO2: 25 mEq/L (ref 19–32)
Calcium: 9.2 mg/dL (ref 8.4–10.5)
Chloride: 108 mEq/L (ref 96–112)
Creat: 1.1 mg/dL (ref 0.50–1.35)
GFR, Est African American: 86 mL/min
GFR, Est Non African American: 74 mL/min
Glucose, Bld: 78 mg/dL (ref 70–99)
Potassium: 3.6 mEq/L (ref 3.5–5.3)
Sodium: 141 mEq/L (ref 135–145)

## 2012-05-15 LAB — LIPID PANEL
Cholesterol: 148 mg/dL (ref 0–200)
HDL: 32 mg/dL — ABNORMAL LOW (ref >39)
LDL Cholesterol: 94 mg/dL (ref 0–99)
Total CHOL/HDL Ratio: 4.6 Ratio
Triglycerides: 108 mg/dL (ref <150)
VLDL: 22 mg/dL (ref 0–40)

## 2012-05-15 MED ORDER — TRAMADOL HCL 50 MG PO TABS: 50.0000 mg | ORAL_TABLET | Freq: Three times a day (TID) | ORAL | Status: DC | PRN

## 2012-05-15 MED ORDER — LISINOPRIL-HYDROCHLOROTHIAZIDE 20-12.5 MG PO TABS: 1.0000 | ORAL_TABLET | Freq: Every day | ORAL | Status: DC

## 2012-05-15 NOTE — Assessment & Plan Note (Signed)
Restarted HCTZ/lisinopril at today's visit. Advised him that this medication is $4. Also had him speak with financial advisor regarding orange card application. Will see him back in 1 month to recheck.

## 2012-05-15 NOTE — Assessment & Plan Note (Signed)
Suspect claudication and extensively discussed tobacco cessation and control of blood pressure as helpful measures.

## 2012-05-15 NOTE — Progress Notes (Signed)
Subjective:     Patient ID: Edward Mullen, male   DOB: Sep 16, 1955, 56 y.o.   MRN: [id number]  HPI The patient is a 56 YO man who comes in today for a check up. He has uncontrolled hypertension and states he has not taken any of his medications in some time (several months) due to financial limitations. He has been having some claudication symptoms and continues to smoke about 1 PPD. No other complaints or problems at today's visit.   Review of Systems  Constitutional: Negative for fever, chills, diaphoresis, activity change, appetite change, fatigue and unexpected weight change.  HENT: Negative.   Eyes: Negative.   Respiratory: Negative for cough, chest tightness, shortness of breath and wheezing.   Cardiovascular: Negative for chest pain, palpitations and leg swelling.  Gastrointestinal: Negative for nausea, vomiting, abdominal pain, diarrhea and constipation.  Genitourinary: Negative.   Musculoskeletal: Positive for myalgias. Negative for back pain, joint swelling and gait problem.  Skin: Negative.   Neurological: Negative.        Objective:   Physical Exam  Constitutional: He is oriented to person, place, and time. He appears well-developed and well-nourished. No distress.  HENT:  Head: Normocephalic and atraumatic.  Eyes: EOM are normal. Pupils are equal, round, and reactive to light.  Neck: Normal range of motion. Neck supple.  Cardiovascular: Normal rate and regular rhythm.   Pulmonary/Chest: Effort normal and breath sounds normal.  Abdominal: Soft. Bowel sounds are normal. He exhibits no distension. There is no tenderness. There is no rebound.  Musculoskeletal: Normal range of motion. He exhibits no edema and no tenderness.  Neurological: He is alert and oriented to person, place, and time. No cranial nerve deficit.       Assessment/Plan:   1. Please see problem oriented charting.  2. Disposition - The patient will be seen back in 1 month for close follow up and  repeat BMP since we are effectively re-starting ACE-I. Check BMP, lipid today. Restart HCTZ/lisinopril. Can titrate upwards at next visit if BP still uncontrolled. Advised use of tramadol for leg pain although strongly reinforced that tobacco cessation and management of hypertension would be better. He was given flu shot at today's visit.

## 2012-05-15 NOTE — Patient Instructions (Signed)
We will check your blood work today and start you back on a blood pressure medicine. You need to stop smoking and get your blood pressure under control to help with your leg pain. We will give you a medicine that you can take up to 3 times per day for the pain. Come back in 1 month to check on these things.   Hypertension Hypertension is another name for high blood pressure. High blood pressure may mean that your heart needs to work harder to pump blood. Blood pressure consists of two numbers, which includes a higher number over a lower number (example: 110/72). HOME CARE   Make lifestyle changes as told by your doctor. This may include weight loss and exercise.  Take your blood pressure medicine every day.  Limit how much salt you use.  Stop smoking if you smoke.  Do not use drugs.  Talk to your doctor if you are using decongestants or birth control pills. These medicines might make blood pressure higher.  Females should not drink more than 1 alcoholic drink per day. Males should not drink more than 2 alcoholic drinks per day.  See your doctor as told. GET HELP RIGHT AWAY IF:   You have a blood pressure reading with a top number of 180 or higher.  You get a very bad headache.  You get blurred or changing vision.  You feel confused.  You feel weak, numb, or faint.  You get chest or belly (abdominal) pain.  You throw up (vomit).  You cannot breathe very well. MAKE SURE YOU:   Understand these instructions.  Will watch your condition.  Will get help right away if you are not doing well or get worse. Document Released: 01/15/2008 Document Revised: 10/21/2011 Document Reviewed: 01/15/2008 [facility] Patient Information 2013 [place], [place].

## 2012-06-19 ENCOUNTER — Ambulatory Visit (INDEPENDENT_AMBULATORY_CARE_PROVIDER_SITE_OTHER): Payer: Self-pay | Admitting: Internal Medicine

## 2012-06-19 ENCOUNTER — Encounter: Payer: Self-pay | Admitting: Internal Medicine

## 2012-06-19 VITALS — BP 156/106 | HR 82 | Temp 98.0°F | Ht 72.0 in | Wt 238.5 lb

## 2012-06-19 DIAGNOSIS — M79609 Pain in unspecified limb: Secondary | ICD-10-CM

## 2012-06-19 DIAGNOSIS — M79606 Pain in leg, unspecified: Secondary | ICD-10-CM

## 2012-06-19 DIAGNOSIS — I10 Essential (primary) hypertension: Secondary | ICD-10-CM

## 2012-06-19 MED ORDER — TRAMADOL HCL 50 MG PO TABS: 100.0000 mg | ORAL_TABLET | Freq: Three times a day (TID) | ORAL | Status: DC | PRN

## 2012-06-19 NOTE — Assessment & Plan Note (Signed)
Tramadol does help this pain and once he gets orange card may pursue ABI to check for PVD. Continue tramadol at dose of 100 mg TID prn.

## 2012-06-19 NOTE — Assessment & Plan Note (Signed)
Patient has been taking lisinopril/HCTZ 20/12.5 daily for 1 month now and BP down about 20 points systolic. Will not make changes today however at next visit if still persistently elevated can consider switching to BID with this medicine. He is also trying to lose some weight and has lost about 6 pounds since last visit. Advised smoking cessation.

## 2012-06-19 NOTE — Patient Instructions (Addendum)
You are doing better with your blood pressure but we are still working on getting it down. Keep exercising and STOP SMOKING! You can take 2 of the tramadol pills for pain up to 3 times per day.  Hypertension As your heart beats, it forces blood through your arteries. This force is your blood pressure. If the pressure is too high, it is called hypertension (HTN) or high blood pressure. HTN is dangerous because you may have it and not know it. High blood pressure may mean that your heart has to work harder to pump blood. Your arteries may be narrow or stiff. The extra work puts you at risk for heart disease, stroke, and other problems.  Blood pressure consists of two numbers, a higher number over a lower, 110/72, for example. It is stated as "110 over 72." The ideal is below 120 for the top number (systolic) and under 80 for the bottom (diastolic). Write down your blood pressure today. You should pay close attention to your blood pressure if you have certain conditions such as:  Heart failure.  Prior heart attack.  Diabetes  Chronic kidney disease.  Prior stroke.  Multiple risk factors for heart disease. To see if you have HTN, your blood pressure should be measured while you are seated with your arm held at the level of the heart. It should be measured at least twice. A one-time elevated blood pressure reading (especially in the Emergency Department) does not mean that you need treatment. There may be conditions in which the blood pressure is different between your right and left arms. It is important to see your caregiver soon for a recheck. Most people have essential hypertension which means that there is not a specific cause. This type of high blood pressure may be lowered by changing lifestyle factors such as:  Stress.  Smoking.  Lack of exercise.  Excessive weight.  Drug/tobacco/alcohol use.  Eating less salt. Most people do not have symptoms from high blood pressure until it has  caused damage to the body. Effective treatment can often prevent, delay or reduce that damage. TREATMENT  When a cause has been identified, treatment for high blood pressure is directed at the cause. There are a large number of medications to treat HTN. These fall into several categories, and your caregiver will help you select the medicines that are best for you. Medications may have side effects. You should review side effects with your caregiver. If your blood pressure stays high after you have made lifestyle changes or started on medicines,   Your medication(s) may need to be changed.  Other problems may need to be addressed.  Be certain you understand your prescriptions, and know how and when to take your medicine.  Be sure to follow up with your caregiver within the time frame advised (usually within two weeks) to have your blood pressure rechecked and to review your medications.  If you are taking more than one medicine to lower your blood pressure, make sure you know how and at what times they should be taken. Taking two medicines at the same time can result in blood pressure that is too low. SEEK IMMEDIATE MEDICAL CARE IF:  You develop a severe headache, blurred or changing vision, or confusion.  You have unusual weakness or numbness, or a faint feeling.  You have severe chest or abdominal pain, vomiting, or breathing problems. MAKE SURE YOU:   Understand these instructions.  Will watch your condition.  Will get help right away if  you are not doing well or get worse. Document Released: 07/29/2005 Document Revised: 10/21/2011 Document Reviewed: 03/18/2008 [facility] Patient Information 2013 [place], [place].

## 2012-06-19 NOTE — Progress Notes (Signed)
Subjective:     Patient ID: Edward Mullen, male   DOB: Jul 23, 1956, 56 y.o.   MRN: [id number]  HPI The patient is a 56 YO man who comes in for a follow up visit of his hypertension. He is also still having some claudication related symptoms in his legs with exertion. He has been taking tramadol for this and it seems to be helping but he took 2 pills of it and this helped more. He is feeling well and is smoking less. He is still smoking about 1/2 PPD although he has thought about quitting. He has not yet brought in his information for his orange card application. I advised him strongly to do this. Counseled him about colonoscopy and he would like to do this when he has financial assistance. No SOB or chest pain. No nausea or vomiting, he is not having excessive urination with his BP meds and takes them every morning. 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  Review of Systems  Constitutional: Negative for fever, chills, diaphoresis, activity change, appetite change, fatigue and unexpected weight change.  HENT: Negative.   Eyes: Negative.   Respiratory: Negative for cough, chest tightness, shortness of breath and wheezing.   Cardiovascular: Negative for chest pain, palpitations and leg swelling.  Gastrointestinal: Negative for nausea, vomiting, abdominal pain, diarrhea, constipation and abdominal distention.  Genitourinary: Negative.   Musculoskeletal: Positive for myalgias. Negative for back pain, joint swelling, arthralgias and gait problem.  Skin: Negative for color change.  Neurological: Negative for dizziness, tremors, seizures, syncope, facial asymmetry, speech difficulty, weakness, light-headedness, numbness and headaches.  Psychiatric/Behavioral: Negative.        Objective:   Physical Exam  Constitutional: He is oriented to person, place, and time. He appears well-developed and well-nourished.  HENT:  Head: Normocephalic and atraumatic.  Eyes: EOM are normal. Pupils are  equal, round, and reactive to light.  Neck: Normal range of motion. Neck supple. No JVD present. No thyromegaly present.  Cardiovascular: Normal rate and regular rhythm.   Pulmonary/Chest: Effort normal and breath sounds normal. No respiratory distress. He has no wheezes. He exhibits no tenderness.  Abdominal: Soft. Bowel sounds are normal. He exhibits no distension. There is no tenderness. There is no rebound and no guarding.  Musculoskeletal: Normal range of motion. He exhibits no edema and no tenderness.  Neurological: He is alert and oriented to person, place, and time. No cranial nerve deficit. Coordination normal.  Skin: Skin is warm and dry.       Assessment/Plan:   1. Please see problem oriented charting.  2. Disposition - The patient will be seen back in 2 months. No changes to medicines at today's visit and BMP drawn. May need to increase his medications at next visit if BP still elevated.

## 2012-06-20 LAB — BASIC METABOLIC PANEL WITH GFR
BUN: 15 mg/dL (ref 6–23)
CO2: 26 mEq/L (ref 19–32)
Calcium: 9.1 mg/dL (ref 8.4–10.5)
Chloride: 108 mEq/L (ref 96–112)
Creat: 1.21 mg/dL (ref 0.50–1.35)
GFR, Est African American: 76 mL/min
GFR, Est Non African American: 66 mL/min
Glucose, Bld: 77 mg/dL (ref 70–99)
Potassium: 4 mEq/L (ref 3.5–5.3)
Sodium: 145 mEq/L (ref 135–145)

## 2012-08-21 ENCOUNTER — Encounter: Payer: Self-pay | Admitting: Internal Medicine

## 2012-10-02 ENCOUNTER — Encounter: Payer: Self-pay | Admitting: Internal Medicine

## 2012-10-12 ENCOUNTER — Encounter: Payer: Self-pay | Admitting: Internal Medicine

## 2012-10-12 ENCOUNTER — Ambulatory Visit (INDEPENDENT_AMBULATORY_CARE_PROVIDER_SITE_OTHER): Payer: Self-pay | Admitting: Internal Medicine

## 2012-10-12 VITALS — BP 164/97 | HR 62 | Temp 98.2°F | Ht 72.0 in | Wt 248.0 lb

## 2012-10-12 DIAGNOSIS — I10 Essential (primary) hypertension: Secondary | ICD-10-CM

## 2012-10-12 MED ORDER — LISINOPRIL-HYDROCHLOROTHIAZIDE 20-12.5 MG PO TABS: 1.0000 | ORAL_TABLET | Freq: Two times a day (BID) | ORAL | Status: DC

## 2012-10-12 MED ORDER — GABAPENTIN 300 MG PO CAPS: ORAL_CAPSULE | ORAL | Status: DC

## 2012-10-12 NOTE — Progress Notes (Signed)
Subjective:     Patient ID: Edward Mullen, male   DOB: [birth year], 57 y.o.   MRN: [id number]  Hypertension Pertinent negatives include no chest pain, headaches, palpitations or shortness of breath.  Leg Pain  Pertinent negatives include no numbness.   The patient is a 57 YO man with PMH of HTN and leg pain. He is a current smoker although he states he has been smoking less lately. His pain is claudication and seems to be worse with walking although abates some with sitting. He is having some aching in his legs in the morning although he has not fallen or has injury to them. He is taking his blood pressure medicine as prescribed. He has no SOB or chest pain. He has still not filled out papers for orange card although he will try. He states that tramadol for his pain has not helped much lately although it used to do better. He has not had any weight loss. Some back pain with major lifting (he is a Special educational needs teacher).   Review of Systems  Constitutional: Negative for fever, chills, diaphoresis, activity change, appetite change, fatigue and unexpected weight change.  HENT: Negative.   Eyes: Negative.   Respiratory: Negative for cough, chest tightness, shortness of breath and wheezing.   Cardiovascular: Negative for chest pain, palpitations and leg swelling.  Gastrointestinal: Negative for nausea, vomiting, abdominal pain, diarrhea, constipation and abdominal distention.  Genitourinary: Negative.   Musculoskeletal: Positive for myalgias. Negative for back pain, joint swelling, arthralgias and gait problem.  Skin: Negative for color change.  Neurological: Negative for dizziness, tremors, seizures, syncope, facial asymmetry, speech difficulty, weakness, light-headedness, numbness and headaches.  Psychiatric/Behavioral: Negative.        Objective:   Physical Exam  Constitutional: He is oriented to person, place, and time. He appears well-developed and well-nourished.  HENT:  Head:  Normocephalic and atraumatic.  Eyes: EOM are normal. Pupils are equal, round, and reactive to light.  Neck: Normal range of motion. Neck supple. No JVD present. No thyromegaly present.  Cardiovascular: Normal rate and regular rhythm.   Pulmonary/Chest: Effort normal and breath sounds normal. No respiratory distress. He has no wheezes. He exhibits no tenderness.  Abdominal: Soft. Bowel sounds are normal. He exhibits no distension. There is no tenderness. There is no rebound and no guarding.  Musculoskeletal: Normal range of motion. He exhibits no edema and no tenderness.  Diminished posterior tibial pulses in bilateral legs. No discoloration and warm to touch.  Neurological: He is alert and oriented to person, place, and time. No cranial nerve deficit. Coordination normal.  Skin: Skin is warm and dry.       Assessment/Plan:   1. Please see problem oriented charting.  2. Disposition - As BP is still elevated and is persistently will increase to lisinopril/HCTZ 20/12.5 BID and recheck BMP in 1 month. Will see back in office in 3 months. Trial gabapentin for claudication pain in legs.

## 2012-10-12 NOTE — Patient Instructions (Addendum)
Come back in 1 month for labs. We will increase your blood pressure pill to twice a day. We will give you gabapentin for pain and for the first week take only 1 per day. After that you can take it up to 2 times per day. This can make you sleepy and so be careful after starting this medicine with machinery and driving. Please send back the cards when you can.  Come back in 3 months for a visit with the doctor and call with problems at [phone].  Smoking Cessation, Tips for Success YOU CAN QUIT SMOKING If you are ready to quit smoking, congratulations! You have chosen to help yourself be healthier. Cigarettes bring nicotine, tar, carbon monoxide, and other irritants into your body. Your lungs, heart, and blood vessels will be able to work better without these poisons. There are many different ways to quit smoking. Nicotine gum, nicotine patches, a nicotine inhaler, or nicotine nasal spray can help with physical craving. Hypnosis, support groups, and medicines help break the habit of smoking. Here are some tips to help you quit for good.  Throw away all cigarettes.  Clean and remove all ashtrays from your home, work, and car.  On a card, write down your reasons for quitting. Carry the card with you and read it when you get the urge to smoke.  Cleanse your body of nicotine. Drink enough water and fluids to keep your urine clear or pale yellow. Do this after quitting to flush the nicotine from your body.  Learn to predict your moods. Do not let a bad situation be your excuse to have a cigarette. Some situations in your life might tempt you into wanting a cigarette.  Never have "just one" cigarette. It leads to wanting another and another. Remind yourself of your decision to quit.  Change habits associated with smoking. If you smoked while driving or when feeling stressed, try other activities to replace smoking. Stand up when drinking your coffee. Brush your teeth after eating. Sit in a different  chair when you read the paper. Avoid alcohol while trying to quit, and try to drink fewer caffeinated beverages. Alcohol and caffeine may urge you to smoke.  Avoid foods and drinks that can trigger a desire to smoke, such as sugary or spicy foods and alcohol.  Ask people who smoke not to smoke around you.  Have something planned to do right after eating or having a cup of coffee. Take a walk or exercise to perk you up. This will help to keep you from overeating.  Try a relaxation exercise to calm you down and decrease your stress. Remember, you may be tense and nervous for the first 2 weeks after you quit, but this will pass.  Find new activities to keep your hands busy. Play with a pen, coin, or rubber band. Doodle or draw things on paper.  Brush your teeth right after eating. This will help cut down on the craving for the taste of tobacco after meals. You can try mouthwash, too.  Use oral substitutes, such as lemon drops, carrots, a cinnamon stick, or chewing gum, in place of cigarettes. Keep them handy so they are available when you have the urge to smoke.  When you have the urge to smoke, try deep breathing.  Designate your home as a nonsmoking area.  If you are a heavy smoker, ask your caregiver about a prescription for nicotine chewing gum. It can ease your withdrawal from nicotine.  Reward yourself. Set aside  the cigarette money you save and buy yourself something nice.  Look for support from others. Join a support group or smoking cessation program. Ask someone at home or at work to help you with your plan to quit smoking.  Always ask yourself, "Do I need this cigarette or is this just a reflex?" Tell yourself, "Today, I choose not to smoke," or "I do not want to smoke." You are reminding yourself of your decision to quit, even if you do smoke a cigarette. HOW WILL I FEEL WHEN I QUIT SMOKING?  The benefits of not smoking start within days of quitting.  You may have symptoms of  withdrawal because your body is used to nicotine (the addictive substance in cigarettes). You may crave cigarettes, be irritable, feel very hungry, cough often, get headaches, or have difficulty concentrating.  The withdrawal symptoms are only temporary. They are strongest when you first quit but will go away within 10 to 14 days.  When withdrawal symptoms occur, stay in control. Think about your reasons for quitting. Remind yourself that these are signs that your body is healing and getting used to being without cigarettes.  Remember that withdrawal symptoms are easier to treat than the major diseases that smoking can cause.  Even after the withdrawal is over, expect periodic urges to smoke. However, these cravings are generally short-lived and will go away whether you smoke or not. Do not smoke!  If you relapse and smoke again, do not lose hope. Most smokers quit 3 times before they are successful.  If you relapse, do not give up! Plan ahead and think about what you will do the next time you get the urge to smoke. LIFE AS A NONSMOKER: MAKE IT FOR A MONTH, MAKE IT FOR LIFE Day 1: Hang this page where you will see it every day. Day 2: Get rid of all ashtrays, matches, and lighters. Day 3: Drink water. Breathe deeply between sips. Day 4: Avoid places with smoke-filled air, such as bars, clubs, or the smoking section of restaurants. Day 5: Keep track of how much money you save by not smoking. Day 6: Avoid boredom. Keep a good book with you or go to the movies. Day 7: Reward yourself! One week without smoking! Day 8: Make a dental appointment to get your teeth cleaned. Day 9: Decide how you will turn down a cigarette before it is offered to you. Day 10: Review your reasons for quitting. Day 11: Distract yourself. Stay active to keep your mind off smoking and to relieve tension. Take a walk, exercise, read a book, do a crossword puzzle, or try a new hobby. Day 12: Exercise. Get off the bus  before your stop or use stairs instead of escalators. Day 13: Call on friends for support and encouragement. Day 14: Reward yourself! Two weeks without smoking! Day 15: Practice deep breathing exercises. Day 16: Bet a friend that you can stay a nonsmoker. Day 17: Ask to sit in nonsmoking sections of restaurants. Day 18: Hang up "No Smoking" signs. Day 19: Think of yourself as a nonsmoker. Day 20: Each morning, tell yourself you will not smoke. Day 21: Reward yourself! Three weeks without smoking! Day 22: Think of smoking in negative ways. Remember how it stains your teeth, gives you bad breath, and leaves you short of breath. Day 23: Eat a nutritious breakfast. Day 24:Do not relive your days as a smoker. Day 25: Hold a pencil in your hand when talking on the telephone. Day 26:  Tell all your friends you do not smoke. Day 27: Think about how much better food tastes. Day 28: Remember, one cigarette is one too many. Day 29: Take up a hobby that will keep your hands busy. Day 30: Congratulations! One month without smoking! Give yourself a big reward. Your caregiver can direct you to community resources or hospitals for support, which may include:  Group support.  Education.  Hypnosis.  Subliminal therapy. Document Released: 04/26/2004 Document Revised: 10/21/2011 Document Reviewed: 05/15/2009 [facility] Patient Information 2013 [place], [place].

## 2012-10-12 NOTE — Assessment & Plan Note (Signed)
Blood pressure still elevated today although does decrease slightly with recheck. Will increase HCTZ/lisinopril 12.5/20 to BID frequency. Lab check of BMP in about 1 month.

## 2012-10-12 NOTE — Assessment & Plan Note (Signed)
Will trial gabapentin 300 mg PO daily for 1 week then 300 PO BID prn until next visit. If not working, can increase dose or frequency.

## 2012-11-12 ENCOUNTER — Other Ambulatory Visit: Payer: Self-pay

## 2013-02-05 ENCOUNTER — Ambulatory Visit (INDEPENDENT_AMBULATORY_CARE_PROVIDER_SITE_OTHER): Payer: Self-pay | Admitting: Internal Medicine

## 2013-02-05 DIAGNOSIS — Z5320 Procedure and treatment not carried out because of patient's decision for unspecified reasons: Secondary | ICD-10-CM

## 2013-02-11 NOTE — Progress Notes (Signed)
Patient ID: Edward Mullen, male   DOB: Jan 07, 1956, 57 y.o.   MRN: [id number]  Patient no showed to his appointment.

## 2013-07-02 ENCOUNTER — Encounter: Payer: Self-pay | Admitting: Internal Medicine

## 2013-10-28 ENCOUNTER — Encounter: Payer: Self-pay | Admitting: Internal Medicine

## 2013-12-16 ENCOUNTER — Ambulatory Visit (INDEPENDENT_AMBULATORY_CARE_PROVIDER_SITE_OTHER): Payer: Self-pay | Admitting: Internal Medicine

## 2013-12-16 ENCOUNTER — Encounter: Payer: Self-pay | Admitting: Internal Medicine

## 2013-12-16 ENCOUNTER — Encounter: Payer: Self-pay | Admitting: Licensed Clinical Social Worker

## 2013-12-16 VITALS — BP 174/106 | HR 66 | Temp 97.4°F | Ht 72.0 in | Wt 240.9 lb

## 2013-12-16 DIAGNOSIS — I10 Essential (primary) hypertension: Secondary | ICD-10-CM

## 2013-12-16 DIAGNOSIS — M79606 Pain in leg, unspecified: Secondary | ICD-10-CM

## 2013-12-16 DIAGNOSIS — M79609 Pain in unspecified limb: Secondary | ICD-10-CM

## 2013-12-16 DIAGNOSIS — K029 Dental caries, unspecified: Secondary | ICD-10-CM

## 2013-12-16 MED ORDER — CYCLOBENZAPRINE HCL 5 MG PO TABS: 5.0000 mg | ORAL_TABLET | Freq: Three times a day (TID) | ORAL | Status: DC | PRN

## 2013-12-16 MED ORDER — LISINOPRIL-HYDROCHLOROTHIAZIDE 20-25 MG PO TABS: 1.0000 | ORAL_TABLET | Freq: Every day | ORAL | Status: DC

## 2013-12-16 NOTE — Patient Instructions (Signed)
We will send in your blood pressure medicine and a medicine for pain which should both be $4 a month.  The blood pressure pill take 1 pill a day. It's called lisinopril/HCTZ.  The pain medicine is called cyclobenzaprine (flexeril) and you can take 1 pill 3 times per day if you need it for pain.   Call us with problems or questions at [phone]. We want you to bring back the paperwork for financial assistance so we can help you out.   Our social worker is going to help you get the resources you need.   Come back once you have been on the blood pressure medicine for about 3-4 weeks.

## 2013-12-16 NOTE — Progress Notes (Signed)
Mr. Legler presents for scheduled [facility] visit today. Pt is unemployed and uninsured.  CSW met with Mr. Riner as pt states he is homeless.  Mr. Standley is currently estranged from his spouse.  Pt is currently living with daughter and grandchildren.  Mr. Kirker has stayed in shelter in years past, prefers not to have shelter listing at this time.  Pt states he does not have an email address and is unsure if his daughter has one.  CSW provided Mr. Tracz with information to [employer] and encouraged pt to ask daughter or grandchildren for email address so that pt can be placed on wait list.  Mr. Osmundson given information to [employer] as pt has no income for rent.  Information on Medication Assistance Program provided for assistance with medication should that be needed.  Mr. Bischof states he has been unable to work the past year due to increasing leg pain.  Pt is a Engineer, manufacturing systems by trade.  Pt goal is to find out what is causing his leg pain.  Pt plans to apply for [facility] card.  Mr. Delcarlo informed once he has his orange card, pt can apply for medical transportation.  Mr. Flink given information on applying for Social Security Disability as he states he is unable to work due to increasing leg pain.  At this time, Mr. Overfelt does not have a contact number as he has lost his [employer] wireless cell phone.  It has been over one year, CSW inquired if pt could have daughter contact [employer] for a possible replacement phone.  Pt has CSW contact information and is aware CSW is available to assist as needed.

## 2013-12-16 NOTE — Assessment & Plan Note (Signed)
Given his symptoms of worse pain with ambulation this is likely a PVD problems and blood flow given his uncontrolled HTN and smoking. 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 When he gets orange card will check ABI. If normal will check CT lumbar spine for DDD. Trial of flexeril for pain as it is cost effective.

## 2013-12-16 NOTE — Assessment & Plan Note (Signed)
BP Readings from Last 3 Encounters:  12/16/13 174/106  10/12/12 164/97  06/19/12 156/106    Lab Results  Component Value Date   NA 145 06/19/2012   K 4.0 06/19/2012   CREATININE 1.21 06/19/2012    Assessment: Blood pressure control: moderately elevated Progress toward BP goal:  unchanged Comments: patient off meds for about 6 months and in last several years have not captured a BP on medication  Plan: Medications:  restart lisinopril/hctz 20/25 mg daily Educational resources provided: brochure;handout;video Self management tools provided: instructions for home blood pressure monitoring Other plans: patient will return  In 3-4 weeks for BP recheck and BMP

## 2013-12-16 NOTE — Progress Notes (Signed)
Subjective:     Patient ID: Edward Mullen, male   DOB: 23-Jun-1956, 58 y.o.   MRN: [id number]  HPI The patient is a 58 YO man with PMH of HTN and leg pain. He is a current smoker although he is down to about 3 cigarettes per day.  His pain is claudication and seems to be worse with walking and abates some with sitting. He is currently unemployed and without stable housing. He has been off medicines for about 6 months. He is having dental problems and many teeth need assistance. He has no SOB or chest pain. He is taking alleve, BCs, occasional pain pill of a friend or relative. 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 He is getting food stamps.    Review of Systems  Constitutional: Negative for fever, chills, diaphoresis, activity change, appetite change, fatigue and unexpected weight change.  HENT: Negative.   Eyes: Negative.   Respiratory: Negative for cough, chest tightness, shortness of breath and wheezing.   Cardiovascular: Negative for chest pain, palpitations and leg swelling.  Gastrointestinal: Negative for nausea, vomiting, abdominal pain, diarrhea, constipation and abdominal distention.  Genitourinary: Negative.   Musculoskeletal: Positive for gait problem and myalgias. Negative for arthralgias, back pain and joint swelling.       Pain with walking  Skin: Negative for color change.  Neurological: Negative for dizziness, tremors, seizures, syncope, facial asymmetry, speech difficulty, weakness, light-headedness, numbness and headaches.  Psychiatric/Behavioral: Negative.        Objective:   Physical Exam  Constitutional: He is oriented to person, place, and time. He appears well-developed and well-nourished.  HENT:  Head: Normocephalic and atraumatic.  Eyes: EOM are normal. Pupils are equal, round, and reactive to light.  Neck: Normal range of motion. Neck supple. No JVD present. No thyromegaly present.  Cardiovascular: Normal  rate and regular rhythm.   No murmur heard. Pulmonary/Chest: Effort normal and breath sounds normal. No respiratory distress. He has no wheezes. He exhibits no tenderness.  Abdominal: Soft. Bowel sounds are normal. He exhibits no distension. There is no tenderness. There is no rebound and no guarding.  Musculoskeletal: Normal range of motion. He exhibits no edema and no tenderness.  Diminished posterior tibial pulses in bilateral legs. No discoloration and warm to touch.  Neurological: He is alert and oriented to person, place, and time. No cranial nerve deficit. Coordination normal.  Skin: Skin is warm and dry.       Assessment/Plan:   1. Please see problem oriented charting.  2. Disposition - As patient is off BP meds will give him lisinopril/HCTZ 20/25 and minimize his costs. Suspect claudication and PVD in his legs causing the claudication given his uncontrolled HTN and smoking. Will try to work up more once he has access to care with orange card. He spoke with Education officer, museum and got paperwork for financial assistance at this visit. Will place referral to dental and have nurse hold until he gets paperwork filled out and approved. Unable to discuss screening today given all other social concerns.

## 2013-12-16 NOTE — Progress Notes (Signed)
Case discussed with Dr. Kollar at the time of the visit.  We reviewed the resident's history and exam and pertinent patient test results.  I agree with the assessment, diagnosis, and plan of care documented in the resident's note.     

## 2014-01-14 ENCOUNTER — Encounter: Payer: Self-pay | Admitting: Internal Medicine

## 2014-08-30 ENCOUNTER — Encounter: Payer: Self-pay | Admitting: Internal Medicine

## 2014-09-16 ENCOUNTER — Telehealth: Payer: Self-pay | Admitting: Internal Medicine

## 2014-09-16 NOTE — Telephone Encounter (Signed)
Call to patient to confirm appointment for 09/19/14 at 2:15. Busy signal unable to leave message

## 2014-09-19 ENCOUNTER — Ambulatory Visit (INDEPENDENT_AMBULATORY_CARE_PROVIDER_SITE_OTHER): Payer: Self-pay | Admitting: Internal Medicine

## 2014-09-19 ENCOUNTER — Encounter: Payer: Self-pay | Admitting: Internal Medicine

## 2014-09-19 VITALS — BP 179/109 | HR 68 | Temp 97.9°F | Wt 241.3 lb

## 2014-09-19 DIAGNOSIS — G453 Amaurosis fugax: Secondary | ICD-10-CM

## 2014-09-19 DIAGNOSIS — I10 Essential (primary) hypertension: Secondary | ICD-10-CM

## 2014-09-19 DIAGNOSIS — I739 Peripheral vascular disease, unspecified: Secondary | ICD-10-CM

## 2014-09-19 DIAGNOSIS — Z720 Tobacco use: Secondary | ICD-10-CM

## 2014-09-19 LAB — CBC
HCT: 44.6 % (ref 39.0–52.0)
Hemoglobin: 15 g/dL (ref 13.0–17.0)
MCH: 28 pg (ref 26.0–34.0)
MCHC: 33.6 g/dL (ref 30.0–36.0)
MCV: 83.2 fL (ref 78.0–100.0)
Platelets: 169 10*3/uL (ref 150–400)
RBC: 5.36 MIL/uL (ref 4.22–5.81)
RDW: 14.7 % (ref 11.5–15.5)
WBC: 6.1 10*3/uL (ref 4.0–10.5)

## 2014-09-19 LAB — BASIC METABOLIC PANEL
Anion gap: 4 — ABNORMAL LOW (ref 5–15)
BUN: 10 mg/dL (ref 6–23)
CO2: 31 mmol/L (ref 19–32)
Calcium: 8.9 mg/dL (ref 8.4–10.5)
Chloride: 105 mmol/L (ref 96–112)
Creatinine, Ser: 1.16 mg/dL (ref 0.50–1.35)
GFR calc Af Amer: 77 mL/min — ABNORMAL LOW (ref >90)
GFR calc non Af Amer: 67 mL/min — ABNORMAL LOW (ref >90)
Glucose, Bld: 89 mg/dL (ref 70–99)
Potassium: 3.8 mmol/L (ref 3.5–5.1)
Sodium: 140 mmol/L (ref 135–145)

## 2014-09-19 LAB — POCT GLYCOSYLATED HEMOGLOBIN (HGB A1C): Hemoglobin A1C: 6.2

## 2014-09-19 MED ORDER — LISINOPRIL-HYDROCHLOROTHIAZIDE 20-25 MG PO TABS: 1.0000 | ORAL_TABLET | Freq: Every day | ORAL | Status: DC

## 2014-09-19 NOTE — Assessment & Plan Note (Signed)
BP Readings from Last 3 Encounters:  09/19/14 179/109  12/16/13 174/106  10/12/12 164/97    Lab Results  Component Value Date   NA 140 09/19/2014   K 3.8 09/19/2014   CREATININE 1.16 09/19/2014    Assessment: Blood pressure control: severely elevated Progress toward BP goal:  unable to assess Comments:  -He reports that he has not had medication for the past year. -denies any chest pain or shortness of breath though reports changes to his vision as noted under amaurosis fugax -Contributing factors include not having coverage, lack of income, and smoking.  Plan: Medications:  Lisinopril/HCTZ 20/25 mg Educational resources provided: brochure Self management tools provided: home blood pressure logbook Other plans:  -Restarted medication today and will have patient pick it up from [facility]

## 2014-09-19 NOTE — Progress Notes (Signed)
   Subjective:    Patient ID: Edward Mullen, male    DOB: [birth year], 59 y.o.   MRN: [id number]  HPI Edward Mullen is a 59 year old male with hypertension, tobacco abuse who presents today for follow-up visit. Please see assessment & plan for documentation of each problem.   Review of Systems  Constitutional: Negative for fever.  Eyes: Positive for visual disturbance. Negative for photophobia, pain and discharge.  Respiratory: Negative for shortness of breath.   Cardiovascular: Negative for chest pain.  Gastrointestinal: Negative for nausea, vomiting, abdominal pain and diarrhea.  Musculoskeletal:       Bilateral leg pain worse with activity       Objective:   Physical Exam Constitutional: He is oriented to person, place, and time. He appears well-developed and well-nourished. No distress.  HENT:  Head: Normocephalic and atraumatic.  Eyes: Conjunctivae are normal. Pupils are equal, round, and reactive to light.  Cardiovascular: Systolic ejection murmur best appreciated right upper sternal border. No carotid bruits appreciated. Pulmonary/Chest: Poor airflow bilaterally. Abdominal: Soft. Bowel sounds are normal. He exhibits no distension. There is no tenderness.  Neurological: He is alert and oriented to person, place, and time. No cranial nerve deficit. Coordination normal.  Extremities: Dorsalis pedis palpable bilaterally. Feet do not appear cyanotic, cold, pale. Skin: Skin is warm and dry. He is not diaphoretic.  Psychiatric: His behavior is normal.      Assessment & Plan:

## 2014-09-19 NOTE — Assessment & Plan Note (Signed)
  Assessment: Progress toward smoking cessation:  unable to assess Barriers to progress toward smoking cessation:  lack of understanding of harms of tobacco, stress, nonadherence to medications, cost of medications Comments:  -Did not realize that his current smoking habit contributes to his claudication pain that he suffers from -Other factors against his ability to quit include stress at home with his living situation, being unable to work  Plan: Instruction/counseling given:  I counseled patient on the dangers of tobacco use, advised patient to stop smoking, and reviewed strategies to maximize success. Educational resources provided:    Self management tools provided:    Medications to assist with smoking cessation:  Brochure for the [employer] Patient agreed to the following self-care plans for smoking cessation:    Other plans:  -Advised him to work on cutting down one cigarette per week if he can tolerate -Provide him with the brochure for the [employer] so that he can try new strategies

## 2014-09-19 NOTE — Progress Notes (Signed)
Internal Medicine Clinic Attending  Case discussed with Dr. Patel at the time of the visit.  We reviewed the resident's history and exam and pertinent patient test results.  I agree with the assessment, diagnosis, and plan of care documented in the resident's note.  

## 2014-09-19 NOTE — Assessment & Plan Note (Addendum)
Overview -Reports having episodes of intermittent visual loss over the last several weeks. -Episodes affect one eye at a time and differ in the visual field involved  -His recent episode was yesterday while he was watching the Super Bowl game.  -These episodes as though "shadows jumping at me."  -Episodes last for seconds and are not associated with any kind of pain.  Assessment -Location of his symptoms suggest that lesion or process is anterior to the optic chiasm -TIA certainly suspect given the risk factors that he has -Absence of pain makes giant cell arteritis, migraine, acute angle glaucoma less likely  Plan -Check stat labs: BMET, CBC, A1c  ADDENDUM 09/19/2014  5:11 PM:  -A1c 6.2, reassuring -BMET and CBC also reassuring -As he is not agreeable to being admitted for observation tonight, he agrees to return tomorrow -Also check lipid panel

## 2014-09-19 NOTE — Assessment & Plan Note (Addendum)
Overview Complains of pain that is along the length of both legs.  -Pain is worse with activity.  -He can only tolerate walking about one half of a block before he has to stop to rest.  -Reports smoking 5-6 cigarettes a day for the last 48 years.  -Pain has been ongoing over the last several years that has gotten worse during this interval.  -As last visit back in May 2015, he was to be referred for [facility] card after which he would be able to get ABI  Assessment -Peripheral vascular disease is suspect given that he has risk factors, notably of which include tobacco abuse, hypertension, though dyslipidemia and diabetes cannot be ruled out since he has not had any labs done since roughly 2013/2012 -Less likely neurologic in etiology given that he denies any kind of numbness or tingling associated with this pain -Physical exam reassuring against findings consistent with acute ischemic limb  Plan -Refer to renew his [facility] card so that he is able to complete ABIs -Recommend tobacco cessation with the quit line

## 2014-09-19 NOTE — Patient Instructions (Addendum)
Please try to bring all your medicines next time. This will help [place] keep you safe from mistakes.   Please pick up your BP medication from [facility].

## 2014-09-20 ENCOUNTER — Observation Stay (HOSPITAL_COMMUNITY)
Admission: AD | Admit: 2014-09-20 | Discharge: 2014-09-21 | Disposition: A | Discharge status: Home / Self Care | Payer: Self-pay | Source: Ambulatory Visit | Attending: Internal Medicine | Admitting: Internal Medicine

## 2014-09-20 ENCOUNTER — Encounter (HOSPITAL_COMMUNITY): Payer: Self-pay | Admitting: General Practice

## 2014-09-20 DIAGNOSIS — Z7982 Long term (current) use of aspirin: Secondary | ICD-10-CM | POA: Insufficient documentation

## 2014-09-20 DIAGNOSIS — Z720 Tobacco use: Secondary | ICD-10-CM | POA: Diagnosis present

## 2014-09-20 DIAGNOSIS — F17210 Nicotine dependence, cigarettes, uncomplicated: Secondary | ICD-10-CM | POA: Insufficient documentation

## 2014-09-20 DIAGNOSIS — H538 Other visual disturbances: Principal | ICD-10-CM

## 2014-09-20 DIAGNOSIS — I739 Peripheral vascular disease, unspecified: Secondary | ICD-10-CM | POA: Insufficient documentation

## 2014-09-20 DIAGNOSIS — I10 Essential (primary) hypertension: Secondary | ICD-10-CM | POA: Insufficient documentation

## 2014-09-20 DIAGNOSIS — I160 Hypertensive urgency: Secondary | ICD-10-CM | POA: Diagnosis present

## 2014-09-20 HISTORY — DX: Unspecified osteoarthritis, unspecified site: M19.90

## 2014-09-20 HISTORY — DX: Prediabetes: R73.03

## 2014-09-20 LAB — LIPID PANEL
Cholesterol: 146 mg/dL (ref 0–200)
HDL: 37 mg/dL — ABNORMAL LOW (ref >39)
LDL Cholesterol: 93 mg/dL (ref 0–99)
Total CHOL/HDL Ratio: 3.9 RATIO
Triglycerides: 79 mg/dL (ref <150)
VLDL: 16 mg/dL (ref 0–40)

## 2014-09-20 LAB — GLUCOSE, CAPILLARY: Glucose-Capillary: 93 mg/dL (ref 70–99)

## 2014-09-20 MED ORDER — SODIUM CHLORIDE 0.9 % IJ SOLN
3.0000 mL | Freq: Two times a day (BID) | INTRAMUSCULAR | Status: DC
  Administered 2014-09-20: 3 mL via INTRAVENOUS

## 2014-09-20 MED ORDER — ACETAMINOPHEN 325 MG PO TABS: 650.0000 mg | ORAL_TABLET | Freq: Four times a day (QID) | ORAL | Status: DC | PRN

## 2014-09-20 MED ORDER — INFLUENZA VAC SPLIT QUAD 0.5 ML IM SUSY
0.5000 mL | PREFILLED_SYRINGE | INTRAMUSCULAR | Status: AC
  Administered 2014-09-21: 0.5 mL via INTRAMUSCULAR
  Filled 2014-09-20: qty 0.5

## 2014-09-20 MED ORDER — PNEUMOCOCCAL VAC POLYVALENT 25 MCG/0.5ML IJ INJ
0.5000 mL | INJECTION | INTRAMUSCULAR | Status: AC
  Administered 2014-09-21: 0.5 mL via INTRAMUSCULAR
  Filled 2014-09-20: qty 0.5

## 2014-09-20 MED ORDER — ACETAMINOPHEN 650 MG RE SUPP: 650.0000 mg | Freq: Four times a day (QID) | RECTAL | Status: DC | PRN

## 2014-09-20 MED ORDER — LISINOPRIL 10 MG PO TABS
10.0000 mg | ORAL_TABLET | Freq: Every day | ORAL | Status: DC
  Administered 2014-09-20 – 2014-09-21 (×2): 10 mg via ORAL
  Filled 2014-09-20 (×2): qty 1

## 2014-09-20 MED ORDER — HEPARIN SODIUM (PORCINE) 5000 UNIT/ML IJ SOLN
5000.0000 [IU] | Freq: Three times a day (TID) | INTRAMUSCULAR | Status: DC
  Administered 2014-09-20 – 2014-09-21 (×3): 5000 [IU] via SUBCUTANEOUS
  Filled 2014-09-20 (×3): qty 1

## 2014-09-20 MED ORDER — HYDROCHLOROTHIAZIDE 12.5 MG PO CAPS
12.5000 mg | ORAL_CAPSULE | Freq: Every day | ORAL | Status: DC
  Administered 2014-09-20 – 2014-09-21 (×2): 12.5 mg via ORAL
  Filled 2014-09-20 (×2): qty 1

## 2014-09-20 MED ORDER — ASPIRIN EC 81 MG PO TBEC
81.0000 mg | DELAYED_RELEASE_TABLET | Freq: Every day | ORAL | Status: DC
  Administered 2014-09-20 – 2014-09-21 (×2): 81 mg via ORAL
  Filled 2014-09-20 (×2): qty 1

## 2014-09-20 MED ORDER — HYDRALAZINE HCL 20 MG/ML IJ SOLN
5.0000 mg | Freq: Once | INTRAMUSCULAR | Status: AC
  Administered 2014-09-20: 5 mg via INTRAVENOUS
  Filled 2014-09-20: qty 1

## 2014-09-20 NOTE — Progress Notes (Signed)
Called Md about BP being high and he ordered hydralazine. Will continue to monitor. Joaquin Bend E, [place] 09/20/2014 2054

## 2014-09-20 NOTE — Addendum Note (Signed)
Addended by: Truddie Crumble on: 09/20/2014 09:32 AM   Modules accepted: Orders

## 2014-09-20 NOTE — Progress Notes (Signed)
Patient arrived to 4N. Vital signs and assessment charted. MD paged. Will continue to monitor. Call bell within patient's reach

## 2014-09-20 NOTE — H&P (Signed)
Date: 09/20/2014               Patient Name:  Edward Mullen MRN: [id number]  DOB: 05/21/1956 Age / Sex: 59 y.o., male   PCP: Drucilla Schmidt, MD         Medical Service: Internal Medicine Teaching Service         Attending Physician: Dr. Thayer Headings, MD    First Contact: Dr. Albin Felling Pager: [phone]  Second Contact: Dr. Juluis Mire Pager: [phone]       After Hours (After 5p/  First Contact Pager: [phone]  weekends / holidays): Second Contact Pager: [phone]   Chief Complaint: vision loss  History of Present Illness: Edward Mullen is a 59 year old man with HTN who presents for intermittent vision loss. He was seen in [facility] yesterday (09/19/14) for this issue. He says that he has had this issue over the past year. He has 1-2 episodes a week and they have not increased in frequency. He says that he will randomly get blurred vision in both eyes at the same time that lasts seconds to minutes. When he looks at an object, it appears that the object is "throbbing." This will resolve on its own and he is unaware of any associations of this phenomenon with anything. He made it clear that his vision does not "go black" but rather becomes blurry and there is no associated eye pain. He does not wear glasses and has no known prior ophthalmologic history. He denies any weakness, numbness, paresthesia, headache, previous CVA. He had one episode during our interview which lasted ~ 2 minutes where he said a can of soda appeared to be throbbing. He had a BMP, CBC, hemoglobin A1c checked in clinic which returned unrevealing. He did not want admission yesterday but agreed to direct admit today. He said he has been out of his BP medication for some time (not clear how long).  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 He says that when he exerts himself he has throbbing b/l leg pain but that it resolves with rest.  Meds: Current Facility-Administered Medications    Medication Dose Route Frequency Provider Last Rate Last Dose  . aspirin EC tablet 81 mg  81 mg Oral Daily Kelby Aline, MD   81 mg at 09/20/14 1647    Allergies: Allergies as of 09/19/2014  . (No Known Allergies)   Past Medical History  Diagnosis Date  . Hypertension    No past surgical history on file. Family History  Problem Relation Age of Onset  . Stroke Mother 33  . Heart disease Father 47   History   Social History  . Marital Status: Married    Spouse Name: N/A    Number of Children: N/A  . 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 Smoking status: Current Every Day Smoker -- 0.30 packs/day for 48 years    Types: Cigarettes  . Smokeless tobacco: Never Used     Comment: His wife also smokes.  . Alcohol Use: Not on file  . Drug Use: No  . Sexual Activity: Yes    Birth Control/ Protection: None   Other Topics Concern  . Not on file   Social History Narrative    Review of Systems:  A comprehensive review of systems was negative except for: as noted above per HPI  Physical Exam: Blood pressure 167/103, pulse 65, temperature 98.2 F (36.8 C), temperature source Oral, resp. rate 14, SpO2 98 %.  Gen: No acute distress, well developed, well nourished HEENT: Atraumatic, PERRL, b/l cataracts noted, b/l arcus senilis noted, no conjunctival injection, fundus exam not possible with constriction of pupils to ~ 2 mm with light, EOMI, sclerae anicteric, moist mucous membranes Heart: Regular rate and rhythm, normal S1 S2, no murmurs, rubs, or gallops Lungs: Clear to auscultation bilaterally, respirations unlabored Abd: Soft, non-tender, non-distended, + bowel sounds, no hepatosplenomegaly Ext: No edema or cyanosis Neuro: A&O x 4, gross VA 20/25 without correction, OD and OS each 20/40 separately, CN II-XII intact, coordination intact, strength 5/5  and symmetric in all extremities, sensation grossly intact, no Babinskis  Lab results: Basic Metabolic Panel:  Recent Labs  09/19/14 1459  NA 140  K 3.8  CL 105  CO2 31  GLUCOSE 89  BUN 10  CREATININE 1.16  CALCIUM 8.9  CBC:  Recent Labs  09/19/14 1459  WBC 6.1  HGB 15.0  HCT 44.6  MCV 83.2  PLT 169   Hemoglobin A1C:  Recent Labs  09/19/14 1522  HGBA1C 6.2   Fasting Lipid Panel:  Recent Labs  09/19/14 1459  CHOL 146  HDL 37*  LDLCALC 93  TRIG 79  CHOLHDL 3.9    Imaging results:  No results found.  Other results: EKG: none  Assessment & Plan by Problem: Active Problems:   Blurred vision, bilateral  #Blurry vision: Edward Mullen was admitted directly from clinic out of concern for TIA work-up given his intermittent episodes with resolution of symptoms between episodes and no permanent neurologic deficits. This sounds like it is an ophthalmologic issue as it is chronic, bilateral blurry vision. His description of a throbbing appearance suggests an epiretinal membrane although it is unlikely this would happen in both eyes at same time. Fundus exam not possible with pupil constriction. He did have bilateral cataracts and says he has night glare. His uncontrolled hypertension can also be contributing with a hypertensive retinopathy. This is painless and eyes not injected so acute angle closure glaucoma unlikely. His initial labs drawn in clinic were negative for diabetes and hyperlipidemia. -out-patient ophthalmology v consult -ASA 81 mg po daily -neuro checks q2h -cardiac monitoring  #Hypertensive Urgency: Presenting BP is 186/113. In clinic yesterday it was 179/109. In setting of TIA/CVA work-up, we will allow permissive HTN. At home he is on lisinopril-HCTZ 20-25 mg daily. This may be contributing to vision issues but he has not been taking his medications for some time so likely lives near here. Will hold antihypertensives for now as it is now trending down  to 167/103. If SBP is >170, will restart low dose HCTZ. Target SBP 140-150. ASCVD 39% risk with smoking likely contribution. May require statin. -reassess BP and consider HCTZ 12.5 mg daily -ASA 81 mg po daily -consider starting statin -cont to monitor  #Diet: heart healthy  #DVT PPx: heparin 5000 u Granjeno tid  #Code: full  Dispo: Disposition is deferred at this time, awaiting improvement of current medical problems. Anticipated discharge in approximately 1-2 day(s).   The patient does have a current PCP Drucilla Schmidt, MD) and does need an [facility] hospital follow-up appointment after discharge.  The patient does not know have transportation limitations that hinder transportation to clinic appointments.  Signed: Kelby Aline, MD 09/20/2014, 4:52 PM  ADDENDUM to change co-signing attending

## 2014-09-21 DIAGNOSIS — H538 Other visual disturbances: Secondary | ICD-10-CM

## 2014-09-21 DIAGNOSIS — F17210 Nicotine dependence, cigarettes, uncomplicated: Secondary | ICD-10-CM

## 2014-09-21 DIAGNOSIS — I10 Essential (primary) hypertension: Secondary | ICD-10-CM

## 2014-09-21 MED ORDER — ASPIRIN 81 MG PO TBEC: 81.0000 mg | DELAYED_RELEASE_TABLET | Freq: Every day | ORAL | Status: DC

## 2014-09-21 NOTE — Progress Notes (Signed)
Patient  Is being discharged home. Discharge instructions were given to patient and family

## 2014-09-21 NOTE — Care Management Note (Signed)
    Page 1 of 1   09/21/2014     10:42:45 AM CARE MANAGEMENT NOTE 09/21/2014  Patient:  TAYTEN, BERGDOLL   Account Number:  [id number]  Date Initiated:  09/21/2014  Documentation initiated by:  Lorne Skeens  Subjective/Objective Assessment:   patient was admitted with elevated BP, visual disturbanced, TIA work-up underway.  Lives at home with relatives.     Action/Plan:   Will follow for discharge needs. Consult recieved for medication assistance.   Anticipated DC Date:     Anticipated DC Plan:  HOME/SELF CARE  In-house referral  Financial Counselor      DC Planning Services  CM consult  Medication Assistance      Choice offered to / List presented to:             Status of service:  In process, will continue to follow Medicare Important Message given?   (If response is "NO", the following Medicare IM given date fields will be blank) Date Medicare IM given:   Medicare IM given by:   Date Additional Medicare IM given:   Additional Medicare IM given by:    Discharge Disposition:  HOME/SELF CARE  Per UR Regulation:    If discussed at Long Length of Stay Meetings, dates discussed:    Comments:  09/21/14 [place] RN, MSN, CM- Received a return call from Dr Redmond Pulling, who states that the plan is to discharge patient home on medications listed on the $4 list.  Per Dr Redmond Pulling, patient states he will be able to afford the $4 medications.  Arrangements are being made by the Internal Medicine Clinic for a follow-up appointment. No additional CM needs at this time.  09/21/14 [place], MSN,  CM- Received a consult for medication assistance. Patient reports no income and has been out of his medications due to inability to pay.  Patient was established with the Internal Medicine Clinic prior to admission.  CM paged Dr Redmond Pulling to discuss discharge medications, as using the $4 medication list may be the best option for patient long-term (if possible/appropriate for  disease process).  CM will continue to follow once discharge medications have been established. Patient has already been contacted by Financial Counseling regarding Medicaid eligibility.

## 2014-09-21 NOTE — Discharge Summary (Signed)
Name: Edward Mullen MRN: [id number] DOB: 1955-10-28 59 y.o. PCP: Drucilla Schmidt, MD  Date of Admission: 09/20/2014 12:47 PM Date of Discharge: 09/21/2014 Attending Physician: Thayer Headings, MD  Discharge Diagnosis: Hypertensive urgency Blurred vision, bilateral Tobacco abuse   Discharge Medications:   Medication List    TAKE these medications        aspirin 81 MG EC tablet  Take 1 tablet (81 mg total) by mouth daily.     cyclobenzaprine 5 MG tablet  Commonly known as:  FLEXERIL  Take 1 tablet (5 mg total) by mouth 3 (three) times daily as needed for muscle spasms.     lisinopril-hydrochlorothiazide 20-25 MG per tablet  Commonly known as:  PRINZIDE,ZESTORETIC  Take 1 tablet by mouth daily.        Disposition and follow-up:   Edward Mullen was discharged from [facility] in Stable condition.  At the hospital follow up visit please address:  1.  Please schedule Opthalmology f/u for the patient for as soon as possible.      Please discuss medication compliance with the patient: did he pick up the antihypertensive and is he taking it?      Please reaffirm the need for tobacco cessation  2.  Labs / imaging needed at time of follow-up: Check BMP to assess renal function and potassium in the setting of ACEi/diuretic initiation.   3.  Pending labs/ test needing follow-up: None  Follow-up Appointments: Follow-up Information    Follow up with Charlott Rakes, MD On 09/27/2014.   Specialty:  Internal Medicine   Why:  Appointment is at 10:45am   Contact information:   [place] [address] [phone]       Follow up with Opthalmology .      Discharge Instructions: Discharge Instructions    Activity as tolerated - No restrictions    Complete by:  As directed      Call MD for:  difficulty breathing, headache or visual disturbances    Complete by:  As directed      Call MD for:  extreme fatigue    Complete by:  As directed     Call MD for:  persistant dizziness or light-headedness    Complete by:  As directed      Call MD for:  severe uncontrolled pain    Complete by:  As directed      Diet - low sodium heart healthy    Complete by:  As directed      Discharge instructions    Complete by:  As directed   Please be sure to pick up your blood pressure prescription from [facility] today.  Please be sure to quit smoking- this will help improve your blood pressure.           Consultations:  None  Procedures Performed:  No results found.   Admission  History of Present Illness: Mr Edward Mullen is a 59 year old man with HTN who presents for intermittent vision loss. He was seen in [facility] yesterday (09/19/14) for this issue. He says that he has had this issue over the past year. He has 1-2 episodes a week and they have not increased in frequency. He says that he will randomly get blurred vision in both eyes at the same time that lasts seconds to minutes. When he looks at an object, it appears that the object is "throbbing." This will resolve on its own and he is unaware  of any associations of this phenomenon with anything. He made it clear that his vision does not "go black" but rather becomes blurry and there is no associated eye pain. He does not wear glasses and has no known prior ophthalmologic history. He denies any weakness, numbness, paresthesia, headache, previous CVA. He had one episode during our interview which lasted ~ 2 minutes where he said a can of soda appeared to be throbbing. He had a BMP, CBC, hemoglobin A1c checked in clinic which returned unrevealing. He did not want admission yesterday but agreed to direct admit today. He said he has been out of his BP medication for some time (not clear how long).  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 He says that when he exerts himself he has throbbing b/l leg pain but that it resolves with rest. Review of Systems: A  comprehensive review of systems was negative except for: as noted above per HPI Physical Exam: Blood pressure 167/103, pulse 65, temperature 98.2 F (36.8 C), temperature source Oral, resp. rate 14, SpO2 98 %.  Gen: No acute distress, well developed, well nourished HEENT: Atraumatic, PERRL, b/l cataracts noted, b/l arcus senilis noted, no conjunctival injection, fundus exam not possible with constriction of pupils to ~ 2 mm with light, EOMI, sclerae anicteric, moist mucous membranes Heart: Regular rate and rhythm, normal S1 S2, no murmurs, rubs, or gallops Lungs: Clear to auscultation bilaterally, respirations unlabored Abd: Soft, non-tender, non-distended, + bowel sounds, no hepatosplenomegaly Ext: No edema or cyanosis Neuro: A&O x 4, gross VA 20/25 without correction, OD and OS each 20/40 separately, CN II-XII intact, coordination intact, strength 5/5 and symmetric in all extremities, sensation grossly intact, no [facility] Course by problem list: 59yo M w/ PMH HTN was admitted with intermittent blurry vision, initially concerning for TIA but was determined to be from uncontrolled hypertension.  #Hypertensive urgency: BP 186/113 on admission. In clinic the day prior to admission, it was 179/109. He was initially admitted for a TIA w/u 2/2 vision changes (please see below) which was actually thought to be 2/2 uncontrolled HTN due to medication noncompliance. He is supposed to be on Lisinopril-HCTZ 20-25mg  at home but reportedly never picked up the prescription. Lisinopril 10mg  daily was started, and HCTZ 12.5mg  was added overnight in addition to 1 dose IV hydralazine 5mg  for BP 193/107. His BP this morning is still elevated, so will resume home medication of Lisinopril-HCTZ 20-25mg  daily. This is on the $4 pharmacy list, so the patient should be able to afford the medication. Adherence was discussed with the patient.  - Lisinopril-HCTZ 20-25mg  po daily.  - Check BMP at hospital f/u to  assess renal fx and K in the setting of ACEi and diuretic use  #Blurred vision, bilateral: The patient was admitted directly from home after he was seen in the clinic due to concerns for a TIA/CVA. The patient was having intermittent episodes of bilateral blurry vision without scotoma or vision loss. The blurriness was pulsatile in nature and was painless. No injection has been present on exam, PERRL, EOMI. Fundoscopic exam was unable to be performed without dilation of the pupil. A1c and lipid panel were normal this admission. The pulsatile blurry vision in the setting of poor antihypertensive medication compliance and hypertensive urgency was thought to be related to his uncontrolled hypertension and not a TIA/CVA. Further stroke work up was not pursued, and the focus was narrowed to blood pressure control. Pt  will need outpatient Opthalmology follow up and will need to take his blood pressure medications.  - F/u with Optho as an outpatient - Antihypertensive medication compliance discussed: RX waiting at his [facility] on [address]. - Continue ASA 81mg  daily  #Tobacco abuse; Pt smokes 5-6 cig/day. Cessation was discussed in detail with the patient, including that it contributes to his hypertension. Pt acknowledged understanding.  - Please further encourage cessation in clinic.    Discharge Vitals:   BP 144/85 mmHg  Pulse 62  Temp(Src) 98.1 F (36.7 C) (Oral)  Resp 20  Wt 232 lb 9.6 oz (105.507 kg)  SpO2 97%  Discharge Labs:  No results found for this or any previous visit (from the past 24 hour(s)).  Signed: Otho Bellows, MD 09/21/2014, 11:41 AM    Services Ordered on Discharge: None Equipment Ordered on Discharge: None

## 2014-09-21 NOTE — Progress Notes (Addendum)
Subjective: No overnight events. Brief episode of hazy vision this morning. Denies dizziness, diplopia, vision loss, or weakness. Normal vision on exam.   Objective: Vital signs in last 24 hours: Filed Vitals:   09/20/14 1759 09/20/14 2054 09/20/14 2241 09/21/14 0500  BP: 173/97 193/107 174/104   Pulse: 60 59 58   Temp: 99 F (37.2 C)     TempSrc: Oral     Resp: 14     Weight:    105.507 kg (232 lb 9.6 oz)  SpO2: 97%      Weight change:   Intake/Output Summary (Last 24 hours) at 09/21/14 0728 Last data filed at 09/21/14 0156  Gross per 24 hour  Intake      3 ml  Output   2300 ml  Net  -2297 ml   Vitals reviewed. General: Sitting up in bed watching TV, NAD HEENT: EOMI, no scleral icterus Cardiac: RRR, no rubs, murmurs or gallops Pulm: Clear to auscultation bilaterally, no wheezes, rales, or rhonchi Abd: Soft, nontender, nondistended, BS present Ext: Warm and well perfused, no pedal edema Neuro: Alert and oriented X3, cranial nerves II-XII grossly intact, moves all 4 extremities  Lab Results: Basic Metabolic Panel:  Recent Labs Lab 09/19/14 1459  NA 140  K 3.8  CL 105  CO2 31  GLUCOSE 89  BUN 10  CREATININE 1.16  CALCIUM 8.9   CBC:  Recent Labs Lab 09/19/14 1459  WBC 6.1  HGB 15.0  HCT 44.6  MCV 83.2  PLT 169   CBG:  Recent Labs Lab 09/19/14 1509  GLUCAP 93   Hemoglobin A1C:  Recent Labs Lab 09/19/14 1522  HGBA1C 6.2   Fasting Lipid Panel:  Recent Labs Lab 09/19/14 1459  CHOL 146  HDL 37*  LDLCALC 93  TRIG 79  CHOLHDL 3.9    Micro Results: No results found for this or any previous visit (from the past 240 hour(s)).   Studies/Results: No results found.   Medications: I have reviewed the patient's current medications. Scheduled Meds: . aspirin EC  81 mg Oral Daily  . heparin  5,000 Units Subcutaneous 3 times per day  . hydrochlorothiazide  12.5 mg Oral Daily  . Influenza vac split quadrivalent PF  0.5 mL Intramuscular  Tomorrow-1000  . lisinopril  10 mg Oral Daily  . pneumococcal 23 valent vaccine  0.5 mL Intramuscular Tomorrow-1000  . sodium chloride  3 mL Intravenous Q12H  . sodium chloride  3 mL Intravenous Q12H   Continuous Infusions:  PRN Meds:.acetaminophen **OR** acetaminophen   Assessment/Plan: 59yo M w/ PMH HTN was admitted with intermittent blurry vision, initially concerning for TIA.  #Hypertensive urgency: BP 186/113 on admission. In clinic the day prior to admission, it was 179/109. He was initially admitted for a TIA w/u 2/2 vision changes (please see below) which was actually thought to be 2/2 uncontrolled HTN due to medication noncompliance. He is supposed to be on Lisinopril-HCTZ 20-25mg  but reportedly never picked up the prescription. Lisinopril 10mg  daily was started, and HCTZ 12.5mg  was added overnight in addition to 1 dose IV hydralazine 5mg  for BP 193/107. His BP this morning is still elevated, so will resume home medication of Lisinopril-HCTZ 20-25mg  daily. This is on the $4 pharmacy list, so the patient should be able to afford the medication. Adherence was discussed with the patient.  - Lisinopril-HCTZ 20-25mg  po daily.   #Blurred vision, bilateral: The patient was admitted directly from home after he was seen in the clinic due to  concerns for a TIA/CVA. The patient was having intermittent episodes of bilateral blurry vision without scotoma or vision loss. The blurriness was pulsatile in nature and was painless. No injection has been present on exam, PERRL, EOMI. Fundoscopic exam was unable to be performed without dilation of the pupil. The pulsatile blurry vision in the setting of poor antihypertensive medication compliance and hypertensive urgency was thought to be related to his uncontrolled hypertension. Pt will ned outpatient opthalmology follow up and will need to take his blood pressure medications.  - F/u with Optho as an outpatient - Antihypertensive medication compliance  discussed  #Tobacco abuse; Pt smokes 5-6 cig/day. Cessation was discussed in detail with the patient, including that it contributes to his hypertension. Pt acknowledged understanding. Recommend further cessation discussion in clinic.   Dispo: D/c to home today.  The patient does have a current PCP Drucilla Schmidt, MD) and does need an [facility] hospital follow-up appointment after discharge.  The patient does not have transportation limitations that hinder transportation to clinic appointments.  .Services Needed at time of discharge: Y = Yes, Blank = No PT:   OT:   RN:   Equipment:   Other:       Otho Bellows, MD Internal Medicine Resident, [facility] 09/21/2014 1:52 PM

## 2014-09-27 ENCOUNTER — Ambulatory Visit: Payer: Self-pay | Admitting: Internal Medicine

## 2014-12-12 ENCOUNTER — Encounter: Payer: Self-pay | Admitting: *Deleted

## 2015-03-20 ENCOUNTER — Telehealth: Payer: Self-pay | Admitting: Internal Medicine

## 2015-03-20 NOTE — Telephone Encounter (Signed)
Call to patient to confirm appointment for 03/21/15 at 2:30 lmtcb

## 2015-03-21 ENCOUNTER — Ambulatory Visit (INDEPENDENT_AMBULATORY_CARE_PROVIDER_SITE_OTHER): Payer: Self-pay | Admitting: Internal Medicine

## 2015-03-21 ENCOUNTER — Encounter: Payer: Self-pay | Admitting: Internal Medicine

## 2015-03-21 VITALS — BP 155/102 | HR 77 | Temp 98.0°F | Ht 72.0 in | Wt 230.2 lb

## 2015-03-21 DIAGNOSIS — I10 Essential (primary) hypertension: Secondary | ICD-10-CM

## 2015-03-21 DIAGNOSIS — I739 Peripheral vascular disease, unspecified: Secondary | ICD-10-CM

## 2015-03-21 DIAGNOSIS — Z720 Tobacco use: Secondary | ICD-10-CM

## 2015-03-21 MED ORDER — LISINOPRIL-HYDROCHLOROTHIAZIDE 20-25 MG PO TABS: 1.0000 | ORAL_TABLET | Freq: Every day | ORAL | Status: DC

## 2015-03-21 MED ORDER — ASPIRIN 81 MG PO TBEC: 81.0000 mg | DELAYED_RELEASE_TABLET | Freq: Every day | ORAL | Status: DC

## 2015-03-21 NOTE — Patient Instructions (Addendum)
Edward Mullen it was nice meeting you today. I will see you in 2 weeks for a follow up appointment.  Please start taking the following medications:  Lisinopril-HCTZ 20-25 1 tablet by mouth everyday for blood pressure   Aspirin 81 mg 1 tablet by mouth everyday     Hypertension Hypertension, commonly called high blood pressure, is when the force of blood pumping through your arteries is too strong. Your arteries are the blood vessels that carry blood from your heart throughout your body. A blood pressure reading consists of a higher number over a lower number, such as 110/72. The higher number (systolic) is the pressure inside your arteries when your heart pumps. The lower number (diastolic) is the pressure inside your arteries when your heart relaxes. Ideally you want your blood pressure below 120/80. Hypertension forces your heart to work harder to pump blood. Your arteries may become narrow or stiff. Having hypertension puts you at risk for heart disease, stroke, and other problems.  RISK FACTORS Some risk factors for high blood pressure are controllable. Others are not.  Risk factors you cannot control include:   Race. You may be at higher risk if you are African American.  Age. Risk increases with age.  Gender. Men are at higher risk than women before age 3 years. After age 2, women are at higher risk than men. Risk factors you can control include:  Not getting enough exercise or physical activity.  Being overweight.  Getting too much fat, sugar, calories, or salt in your diet.  Drinking too much alcohol. SIGNS AND SYMPTOMS Hypertension does not usually cause signs or symptoms. Extremely high blood pressure (hypertensive crisis) may cause headache, anxiety, shortness of breath, and nosebleed. DIAGNOSIS  To check if you have hypertension, your health care provider will measure your blood pressure while you are seated, with your arm held at the level of your heart. It should be  measured at least twice using the same arm. Certain conditions can cause a difference in blood pressure between your right and left arms. A blood pressure reading that is higher than normal on one occasion does not mean that you need treatment. If one blood pressure reading is high, ask your health care provider about having it checked again. TREATMENT  Treating high blood pressure includes making lifestyle changes and possibly taking medicine. Living a healthy lifestyle can help lower high blood pressure. You may need to change some of your habits. Lifestyle changes may include:  Following the DASH diet. This diet is high in fruits, vegetables, and whole grains. It is low in salt, red meat, and added sugars.  Getting at least 2 hours of brisk physical activity every week.  Losing weight if necessary.  Not smoking.  Limiting alcoholic beverages.  Learning ways to reduce stress. If lifestyle changes are not enough to get your blood pressure under control, your health care provider may prescribe medicine. You may need to take more than one. Work closely with your health care provider to understand the risks and benefits. HOME CARE INSTRUCTIONS  Have your blood pressure rechecked as directed by your health care provider.   Take medicines only as directed by your health care provider. Follow the directions carefully. Blood pressure medicines must be taken as prescribed. The medicine does not work as well when you skip doses. Skipping doses also puts you at risk for problems.   Do not smoke.   Monitor your blood pressure at home as directed by your health care  provider. SEEK MEDICAL CARE IF:   You think you are having a reaction to medicines taken.  You have recurrent headaches or feel dizzy.  You have swelling in your ankles.  You have trouble with your vision. SEEK IMMEDIATE MEDICAL CARE IF:  You develop a severe headache or confusion.  You have unusual weakness, numbness,  or feel faint.  You have severe chest or abdominal pain.  You vomit repeatedly.  You have trouble breathing. MAKE SURE YOU:   Understand these instructions.  Will watch your condition.  Will get help right away if you are not doing well or get worse. Document Released: 07/29/2005 Document Revised: 12/13/2013 Document Reviewed: 05/21/2013 [facility] Patient Information 2015 [place], [place]. This information is not intended to replace advice given to you by your health care provider. Make sure you discuss any questions you have with your health care provider.

## 2015-03-21 NOTE — Progress Notes (Signed)
59 year old man with uncontrolled hypertension, current smoker, prediabetes, chronic leg pain with neuropathic features who is not taking any medication currently due to financial reasons.   Spent >15 minutes with the patient, with Dr Lonia Farber, in counseling regarding compliance and smoking cessation.   Discussed his case with Clarice Pole, our financial counselor and arranged a meeting today. He will meet with Clarice Pole now for [employer].   For hypertension, he is motivated to restart his lisinopril-HCTZ 20-25 available for $4 at [facility]. Discussed the risks involved with uncontrolled hypertension. Patient verbalizes understanding. No associated symptoms reported (No chest pain, palpitations, headaches, neurological symptoms, decreased vision - has been a chronic complaint)  For his leg pain (bilateral associated with numbness and tingling, walking, improves with rest, sometimes nocturnal), exam reveals good DP pulses, warm and well perfused extremities. No back pain reported per patient. Patient advised to take baby aspirin. He refuses any further medications because of affordability.  Prediabetes - A1c next visit. Madilyn Fireman MD MPH 03/21/2015 3:47 PM

## 2015-03-22 NOTE — Assessment & Plan Note (Addendum)
Patient stopped taking his medication 5 months ago due to financial reasons. BP today is 155/102. Denies any CP, SOB, HA, or focal neurological symptoms. Complaining of occasional blurry vision, which is a chronic problem for him. He was taken to Ms. Butch Penny Hill's office today to apply for an orange card.  -Restarted Lisinopril-HCTZ 20-25 mg 1 tab QD. I informed the patient that this medication is available for 4 dollars at [facility]. As per patient, he is able to afford 4 dollars a month.  -Discussed the importance of compliance to his medication and he expressed his understanding.  -F/u in 2 weeks, recheck BP

## 2015-03-22 NOTE — Progress Notes (Signed)
Patient ID: Edward Mullen, male   DOB: 12/14/1955, 59 y.o.   MRN: [id number]   Subjective:   Patient ID: Edward Mullen male   DOB: [birth year] 59 y.o.   MRN: [id number]  HPI: Edward Mullen is a 59 y.o. M with a PMHx HTN, claudication of lower extremities, and tobacco use presenting to the clinic today for a follow up. Patient states his BP was well controlled with Lisinopril-HCTZ in the past but he stopped taking the medication due to financial reasons. Denies any HA, CP, SOB, or focal neurological symptoms. Also complaining of a 2 year history of b/l lower extremity calf and hamstring muscle tightness and 8/10 intensity pain when walking or standing on feet for a while. Denies any lower extremity sores, hair loss, or toe nail loss.   Of note: patient is currently insured and was referred to Ms. Harland Dingwall today for an orange card.   Past Medical History  Diagnosis Date  . Hypertension   . Borderline type 2 diabetes mellitus   . Arthritis     "feels like it in my legs" (09/20/2014)   Current Outpatient Prescriptions  Medication Sig Dispense Refill  . aspirin 81 MG EC tablet Take 1 tablet (81 mg total) by mouth daily. 90 tablet 3  . lisinopril-hydrochlorothiazide (PRINZIDE,ZESTORETIC) 20-25 MG per tablet Take 1 tablet by mouth daily. 90 tablet 1   No current facility-administered medications for this visit.   Family History  Problem Relation Age of Onset  . Stroke Mother 44  . Heart disease Father 9   Social History   Social History  . Marital Status: Married    Spouse Name: N/A  . Number of Children: N/A  . 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 Smoking status: Current Every Day Smoker -- 0.30 packs/day for 48 years    Types: Cigarettes  . Smokeless tobacco: Never Used  . Alcohol Use: 3.6 oz/week    0 Standard drinks or equivalent, 6  Cans of beer per week  . Drug Use: No  . Sexual Activity: Yes    Birth Control/ Protection: None   Other Topics Concern  . None   Social History Narrative   Review of Systems: Review of Systems  Constitutional: Negative for fever, chills and malaise/fatigue.  Eyes: Positive for blurred vision. Negative for pain.  Respiratory: Negative for cough, shortness of breath and wheezing.   Cardiovascular: Positive for claudication. Negative for chest pain, palpitations and leg swelling.  Gastrointestinal: Negative for nausea, vomiting and abdominal pain.  Skin: Negative for itching and rash.  Neurological: Negative for dizziness, tingling, sensory change and focal weakness.    Objective:  Physical Exam: Filed Vitals:   03/21/15 1449  BP: 155/102  Pulse: 77  Temp: 98 F (36.7 C)  TempSrc: Oral  Height: 6' (1.829 m)  Weight: 230 lb 3.2 oz (104.418 kg)  SpO2: 100%   Physical Exam  Constitutional: He is oriented to person, place, and time. He appears well-developed and well-nourished.  HENT:  Head: Normocephalic and atraumatic.  Eyes: EOM are normal. Pupils are equal, round, and reactive to light.  Neck: Neck supple. No tracheal deviation present.  Cardiovascular: Normal rate, regular rhythm and intact distal pulses.   Dorsalis pedis and posterior tibial pulses intact Lower extremities: skin is warm, no sores, no  hair loss  Pulmonary/Chest: Effort normal and breath sounds normal. No respiratory distress. He has no wheezes. He has no rales.  Abdominal: Soft. Bowel sounds are normal. He exhibits no distension. There is no tenderness.  Neurological: He is alert and oriented to person, place, and time. No cranial nerve deficit.  Skin: Skin is warm and dry.    Assessment & Plan:

## 2015-03-23 NOTE — Assessment & Plan Note (Signed)
Smoking 1/2 ppd since age 59. Recently cut down to 3 cigarettes per day. I discussed the importance of smoking cessation and options available to help facilitate this process. Patient expressed his understanding but states he is not ready at this time. -Discuss again at next visit to see if patient is ready to quit

## 2015-03-23 NOTE — Assessment & Plan Note (Addendum)
Patient complaining of a 2 year history of b/l lower extremity cramping pain. Patient's symptoms are likely due to peripheral arterial disease. He has a history of smoking which is a risk factor. However, physical exam did not reveal diminished pulses, or hair loss. Skin was warm and lower extremities well perfused. Patient does report the pain getting worse with standing for long periods of time and as such peripheral venous insufficiency could be a differential. However, he does not have any skin changes, varicose veins, or lower extremity edema.  -ASA 81 mg QD -Patient awaiting orange card. Measure ABI when patient returns for follow up in 2 weeks.

## 2015-03-27 NOTE — Progress Notes (Signed)
I saw and evaluated the patient.  I personally confirmed the key portions of the history and exam documented by Dr. Marlowe Sax and I reviewed pertinent patient test results.  The assessment, diagnosis, and plan were formulated together and I agree with the documentation in the resident's note.

## 2015-04-04 ENCOUNTER — Ambulatory Visit: Payer: Self-pay | Admitting: Internal Medicine

## 2015-04-05 ENCOUNTER — Ambulatory Visit: Payer: Self-pay | Admitting: Internal Medicine

## 2015-06-09 ENCOUNTER — Ambulatory Visit: Payer: Self-pay | Admitting: Internal Medicine

## 2015-09-05 ENCOUNTER — Telehealth: Payer: Self-pay | Admitting: Licensed Clinical Social Worker

## 2015-09-05 NOTE — Telephone Encounter (Signed)
[employer] information provided in [employer].

## 2015-09-05 NOTE — Telephone Encounter (Signed)
Mr. Rondeau placed call to CSW to discuss need for assistance.  Pt states he has not applied for the [facility] and does not have transportation.  Mr. Toews states he is unable to work due to his leg issues, pt has not applied for Social Security Disability.  CSW offered to provide pt with information over the phone or mail information to Mr. Quentin Cornwall.  Pt requesting CSW to mail information.  CSW confirmed address and mailed letter and information to Mr. Quentin Cornwall.  CSW will offer pt [facility] bus passes to attend at least one appointment until pt can apply for [facility] card.

## 2016-04-18 ENCOUNTER — Emergency Department (HOSPITAL_COMMUNITY)
Admission: EM | Admit: 2016-04-18 | Discharge: 2016-04-18 | Disposition: A | Discharge status: Home / Self Care | Payer: Self-pay | Attending: Emergency Medicine | Admitting: Emergency Medicine

## 2016-04-18 ENCOUNTER — Encounter (HOSPITAL_COMMUNITY): Payer: Self-pay | Admitting: Emergency Medicine

## 2016-04-18 DIAGNOSIS — Z5321 Procedure and treatment not carried out due to patient leaving prior to being seen by health care provider: Secondary | ICD-10-CM | POA: Insufficient documentation

## 2016-04-18 DIAGNOSIS — Z7982 Long term (current) use of aspirin: Secondary | ICD-10-CM | POA: Insufficient documentation

## 2016-04-18 DIAGNOSIS — R350 Frequency of micturition: Secondary | ICD-10-CM | POA: Insufficient documentation

## 2016-04-18 DIAGNOSIS — I10 Essential (primary) hypertension: Secondary | ICD-10-CM | POA: Insufficient documentation

## 2016-04-18 DIAGNOSIS — F17210 Nicotine dependence, cigarettes, uncomplicated: Secondary | ICD-10-CM | POA: Insufficient documentation

## 2016-04-18 DIAGNOSIS — R300 Dysuria: Secondary | ICD-10-CM | POA: Insufficient documentation

## 2016-04-18 NOTE — ED Notes (Signed)
No answer, unable to find. 

## 2016-04-18 NOTE — ED Triage Notes (Signed)
Pt states he has been having some burning when he urinates and frequency. Pt sates the pain goes into back and legs. Pt states he feels like someone poured "hot sauce on him" bc he is burning when he walks. Pt also states he is having trouble with bowel movements. LBM today.

## 2016-08-05 ENCOUNTER — Emergency Department (HOSPITAL_COMMUNITY)
Admission: EM | Admit: 2016-08-05 | Discharge: 2016-08-05 | Discharge status: Against Medical Advice | Payer: Medicaid Other | Attending: Emergency Medicine | Admitting: Emergency Medicine

## 2016-08-05 ENCOUNTER — Encounter (HOSPITAL_COMMUNITY): Payer: Self-pay | Admitting: Emergency Medicine

## 2016-08-05 ENCOUNTER — Emergency Department (HOSPITAL_COMMUNITY): Payer: Medicaid Other

## 2016-08-05 DIAGNOSIS — F17210 Nicotine dependence, cigarettes, uncomplicated: Secondary | ICD-10-CM | POA: Insufficient documentation

## 2016-08-05 DIAGNOSIS — I5023 Acute on chronic systolic (congestive) heart failure: Secondary | ICD-10-CM | POA: Insufficient documentation

## 2016-08-05 DIAGNOSIS — Z79899 Other long term (current) drug therapy: Secondary | ICD-10-CM | POA: Insufficient documentation

## 2016-08-05 DIAGNOSIS — I110 Hypertensive heart disease with heart failure: Secondary | ICD-10-CM | POA: Insufficient documentation

## 2016-08-05 DIAGNOSIS — Z7982 Long term (current) use of aspirin: Secondary | ICD-10-CM | POA: Diagnosis not present

## 2016-08-05 DIAGNOSIS — I10 Essential (primary) hypertension: Secondary | ICD-10-CM

## 2016-08-05 DIAGNOSIS — R0602 Shortness of breath: Secondary | ICD-10-CM | POA: Diagnosis present

## 2016-08-05 LAB — URINALYSIS, ROUTINE W REFLEX MICROSCOPIC
Bacteria, UA: NONE SEEN
Bilirubin Urine: NEGATIVE
Glucose, UA: NEGATIVE mg/dL
Hgb urine dipstick: NEGATIVE
Ketones, ur: NEGATIVE mg/dL
Leukocytes, UA: NEGATIVE
Nitrite: NEGATIVE
Protein, ur: 100 mg/dL — AB
Specific Gravity, Urine: 1.021 (ref 1.005–1.030)
pH: 6 (ref 5.0–8.0)

## 2016-08-05 LAB — CBC
HCT: 40.2 % (ref 39.0–52.0)
Hemoglobin: 13.3 g/dL (ref 13.0–17.0)
MCH: 27.4 pg (ref 26.0–34.0)
MCHC: 33.1 g/dL (ref 30.0–36.0)
MCV: 82.7 fL (ref 78.0–100.0)
Platelets: 231 10*3/uL (ref 150–400)
RBC: 4.86 MIL/uL (ref 4.22–5.81)
RDW: 14.8 % (ref 11.5–15.5)
WBC: 9.3 10*3/uL (ref 4.0–10.5)

## 2016-08-05 LAB — BASIC METABOLIC PANEL
Anion gap: 8 (ref 5–15)
BUN: 18 mg/dL (ref 6–20)
CO2: 26 mmol/L (ref 22–32)
Calcium: 8.4 mg/dL — ABNORMAL LOW (ref 8.9–10.3)
Chloride: 106 mmol/L (ref 101–111)
Creatinine, Ser: 1.52 mg/dL — ABNORMAL HIGH (ref 0.61–1.24)
GFR calc Af Amer: 55 mL/min — ABNORMAL LOW (ref >60)
GFR calc non Af Amer: 47 mL/min — ABNORMAL LOW (ref >60)
Glucose, Bld: 129 mg/dL — ABNORMAL HIGH (ref 65–99)
Potassium: 4.2 mmol/L (ref 3.5–5.1)
Sodium: 140 mmol/L (ref 135–145)

## 2016-08-05 LAB — I-STAT TROPONIN, ED
Troponin i, poc: 0.04 ng/mL (ref 0.00–0.08)
Troponin i, poc: 0.02 ng/mL (ref 0.00–0.08)

## 2016-08-05 LAB — BRAIN NATRIURETIC PEPTIDE: B Natriuretic Peptide: 1150.3 pg/mL — ABNORMAL HIGH (ref 0.0–100.0)

## 2016-08-05 LAB — LIPASE, BLOOD: Lipase: 29 U/L (ref 11–51)

## 2016-08-05 MED ORDER — LISINOPRIL-HYDROCHLOROTHIAZIDE 20-25 MG PO TABS: 1.0000 | ORAL_TABLET | Freq: Every day | ORAL | 1 refills | Status: DC

## 2016-08-05 MED ORDER — FUROSEMIDE 10 MG/ML IJ SOLN
40.0000 mg | Freq: Once | INTRAMUSCULAR | Status: AC
  Administered 2016-08-05: 40 mg via INTRAVENOUS
  Filled 2016-08-05: qty 4

## 2016-08-05 MED ORDER — GI COCKTAIL ~~LOC~~
30.0000 mL | Freq: Once | ORAL | Status: AC
  Administered 2016-08-05: 30 mL via ORAL
  Filled 2016-08-05: qty 30

## 2016-08-05 NOTE — ED Notes (Signed)
MD discussed with pt that she was not ready for pt to leave facility and that further testing was necessary; pt verbalized understanding of risks of leaving against medical advice with both MD and this nurse; pt insisted he still wanted to leave

## 2016-08-05 NOTE — ED Provider Notes (Signed)
[facility] DEPT Provider Note   CSN: [id number] Arrival date & time: 08/05/16  1620     History   Chief Complaint Chief Complaint  Patient presents with  . Shortness of Breath  . Abdominal Pain    HPI Edward Mullen is a 60 y.o. male.  Patient with a history of hypertension and borderline diabetes mellitus presents with shortness of breath. He states over last 2 weeks he's had worsening shortness of breath with a cough that is productive of white sputum. He's had increase in his lower extremity swelling. He's had some intermittent tightness across his chest. He denies any fevers. He denies abdominal pain. He does have a pain in his left buttocks that radiates to his left groin and shoots down his left leg. He has intermittent tingling to both of his feet but no persistent weakness or numbness. He states the pain comes and goes. He has no recent injuries. He also complains of pain to his buttocks crease. States it hurts worse when he urinates. He was previously taking lisinopril/hydrochlorothiazide that is been out of it for several months. He currently states he doesn't have a PCP although I see notes approximately one year ago from the [facility] internal medicine clinic. He states they are still his PCP but he just hasn't had transportation to go there.      Past Medical History:  Diagnosis Date  . Arthritis    "feels like it in my legs" (09/20/2014)  . Borderline type 2 diabetes mellitus   . Hypertension     Patient Active Problem List   Diagnosis Date Noted  . Blurred vision, bilateral 09/20/2014  . Hypertensive urgency 09/20/2014  . Tobacco abuse 09/19/2014  . HTN (hypertension) 03/28/2011  . Claudication of both lower extremities ([place]) 03/28/2011  . Erectile dysfunction 03/28/2011    Past Surgical History:  Procedure Laterality Date  . CYSTECTOMY Right    "back of my shoulder"  . TONSILLECTOMY         Home Medications    Prior to Admission  medications   Medication Sig Start Date End Date Taking? Authorizing Provider  aspirin 81 MG EC tablet Take 1 tablet (81 mg total) by mouth daily. Patient not taking: Reported on 08/05/2016 03/21/15   Shela Leff, MD  lisinopril-hydrochlorothiazide (PRINZIDE,ZESTORETIC) 20-25 MG tablet Take 1 tablet by mouth daily. 08/05/16   Malvin Johns, MD    Family History Family History  Problem Relation Age of Onset  . Stroke Mother 38  . Heart disease Father 56    Social History Social History  Substance Use Topics  . Smoking status: Current Every Day Smoker    Packs/day: 0.30    Years: 48.00    Types: Cigarettes  . Smokeless tobacco: Never Used  . Alcohol use 3.6 oz/week    6 Cans of beer per week     Allergies   Patient has no known allergies.   Review of Systems Review of Systems  Constitutional: Positive for fatigue. Negative for chills, diaphoresis and fever.  HENT: Negative for congestion, rhinorrhea and sneezing.   Eyes: Negative.   Respiratory: Positive for cough and shortness of breath. Negative for chest tightness.   Cardiovascular: Positive for chest pain and leg swelling.  Gastrointestinal: Negative for abdominal pain, blood in stool, diarrhea, nausea and vomiting.  Genitourinary: Negative for difficulty urinating, flank pain, frequency and hematuria.  Musculoskeletal: Negative for arthralgias and back pain.  Skin: Negative for rash.  Neurological: Negative for dizziness, speech difficulty,  weakness, numbness and headaches.     Physical Exam Updated Vital Signs BP 162/93   Pulse 98   Temp 97.4 F (36.3 C) (Oral)   Resp 13   SpO2 99%   Physical Exam  Constitutional: He is oriented to person, place, and time. He appears well-developed and well-nourished.  HENT:  Head: Normocephalic and atraumatic.  Eyes: Pupils are equal, round, and reactive to light.  Neck: Normal range of motion. Neck supple.  Cardiovascular: Normal rate, regular rhythm and normal  heart sounds.   Pulmonary/Chest: Effort normal and breath sounds normal. No respiratory distress. He has no wheezes. He has no rales. He exhibits no tenderness.  Scattered rhonchi  Abdominal: Soft. Bowel sounds are normal. There is no tenderness. There is no rebound and no guarding.  Genitourinary:  Genitourinary Comments: Patient has a small skin tear to his buttocks crease. No signs of infection. No induration or fluctuance. No pain to his perirectal area.  Musculoskeletal: Normal range of motion. He exhibits edema.  Mild tenderness over his left sciatic nerve. There is no pain on range of motion the left hip. Pedal pulses are intact. There's no pain to the knee or ankle. 2+ pain edema bilaterally.  Lymphadenopathy:    He has no cervical adenopathy.  Neurological: He is alert and oriented to person, place, and time.  Skin: Skin is warm and dry. No rash noted.  Psychiatric: He has a normal mood and affect.     ED Treatments / Results  Labs (all labs ordered are listed, but only abnormal results are displayed) Labs Reviewed  BASIC METABOLIC PANEL - Abnormal; Notable for the following:       Result Value   Glucose, Bld 129 (*)    Creatinine, Ser 1.52 (*)    Calcium 8.4 (*)    GFR calc non Af Amer 47 (*)    GFR calc Af Amer 55 (*)    All other components within normal limits  URINALYSIS, ROUTINE W REFLEX MICROSCOPIC - Abnormal; Notable for the following:    Protein, ur 100 (*)    Squamous Epithelial / LPF 0-5 (*)    All other components within normal limits  BRAIN NATRIURETIC PEPTIDE - Abnormal; Notable for the following:    B Natriuretic Peptide 1,150.3 (*)    All other components within normal limits  CBC  LIPASE, BLOOD  I-STAT TROPOININ, ED  I-STAT TROPOININ, ED    EKG  EKG Interpretation  Date/Time:  Monday August 05 2016 17:03:35 EST Ventricular Rate:  101 PR Interval:    QRS Duration: 135 QT Interval:  366 QTC Calculation: 475 R Axis:   -42 Text  Interpretation:  Sinus tachycardia Nonspecific IVCD with LAD LVH with secondary repolarization abnormality Confirmed by Ravina Milner  MD, Maeryn Mcgath ([id number]) on 08/05/2016 5:09:48 PM       Radiology Dg Chest 2 View  Result Date: 08/05/2016 CLINICAL DATA:  Chest pain for 2 days EXAM: CHEST  2 VIEW COMPARISON:  None. FINDINGS: Cardiac shadow is mildly enlarged. The lungs are well aerated bilaterally. No focal infiltrate or sizable effusion is seen. No acute bony abnormality is noted. IMPRESSION: No active cardiopulmonary disease. Electronically Signed   By: Inez Catalina M.D.   On: 08/05/2016 16:36    Procedures Procedures (including critical care time)  Medications Ordered in ED Medications  furosemide (LASIX) injection 40 mg (40 mg Intravenous Given 08/05/16 1759)  gi cocktail (Maalox,Lidocaine,Donnatal) (30 mLs Oral Given 08/05/16 1959)     Initial Impression /  Assessment and Plan / ED Course  I have reviewed the triage vital signs and the nursing notes.  Pertinent labs & imaging results that were available during my care of the patient were reviewed by me and considered in my medical decision making (see chart for details).  Clinical Course     Patient presents with chest pain and shortness of breath. He reported abdominal pain but I didn't get any on exam. It seems to be more sciatica. At one point he did complain of some epigastric pain which resolved with a GI cocktail. His most concerning complaints with the chest pain shortness of breath. He currently denies any chest pain. His EKG shows ST elevation which looks most consistent with his worsening LVH. There is no reciprocal changes. However patient did have a markedly elevated BNP. He was given dose of Lasix and has diuresed. He still reports shortness of breath and he still mildly tachycardic. His heart rate is right around 100. However he is refusing to stay any longer. I had a long discussion with them and try to convince him to stay for  further observation and possible admission if he has no improvement he is refusing to stay. He left AMA prior to completion of treatment. I did give him a month's supply of his blood pressure medicine.  Final Clinical Impressions(s) / ED Diagnoses   Final diagnoses:  Acute on chronic systolic congestive heart failure [facility])    New Prescriptions Current Discharge Medication List       Malvin Johns, MD 08/05/16 2134

## 2016-08-05 NOTE — ED Notes (Signed)
Pt encouraged to use urinal for urine sample.

## 2016-08-05 NOTE — ED Triage Notes (Signed)
Pt reports SOB, bilateral lower leg swelling, and abd pain for the past 2 days. Threw up once today. No diarrhea. Has had urinary frequency. Intermittent CP. No dizziness/lightheadeness

## 2016-08-05 NOTE — ED Notes (Signed)
Pt provided urinal to obtain sample

## 2016-08-05 NOTE — ED Notes (Addendum)
Made Dr Tamera Punt aware of patient abd burning and his request for something to drink to help with it.  Verbal ok for PO fluids.  No orders for medications at this time.   Patient given ginger ale

## 2016-08-23 ENCOUNTER — Inpatient Hospital Stay (HOSPITAL_COMMUNITY)
Admission: AD | Admit: 2016-08-23 | Discharge: 2016-08-28 | DRG: 287 | Disposition: A | Discharge status: Home / Self Care | Payer: Medicaid Other | Source: Ambulatory Visit | Attending: Internal Medicine | Admitting: Internal Medicine

## 2016-08-23 ENCOUNTER — Ambulatory Visit (INDEPENDENT_AMBULATORY_CARE_PROVIDER_SITE_OTHER): Payer: Self-pay | Admitting: Internal Medicine

## 2016-08-23 ENCOUNTER — Inpatient Hospital Stay (HOSPITAL_COMMUNITY): Payer: Medicaid Other

## 2016-08-23 ENCOUNTER — Encounter: Payer: Self-pay | Admitting: Internal Medicine

## 2016-08-23 ENCOUNTER — Encounter (HOSPITAL_COMMUNITY): Payer: Self-pay | Admitting: *Deleted

## 2016-08-23 VITALS — BP 144/97 | HR 93 | Temp 97.4°F | Ht 72.0 in | Wt 223.1 lb

## 2016-08-23 DIAGNOSIS — I472 Ventricular tachycardia: Secondary | ICD-10-CM | POA: Diagnosis present

## 2016-08-23 DIAGNOSIS — N390 Urinary tract infection, site not specified: Secondary | ICD-10-CM | POA: Diagnosis present

## 2016-08-23 DIAGNOSIS — Z23 Encounter for immunization: Secondary | ICD-10-CM

## 2016-08-23 DIAGNOSIS — I429 Cardiomyopathy, unspecified: Secondary | ICD-10-CM | POA: Diagnosis present

## 2016-08-23 DIAGNOSIS — I110 Hypertensive heart disease with heart failure: Secondary | ICD-10-CM

## 2016-08-23 DIAGNOSIS — E876 Hypokalemia: Secondary | ICD-10-CM | POA: Diagnosis present

## 2016-08-23 DIAGNOSIS — I5023 Acute on chronic systolic (congestive) heart failure: Secondary | ICD-10-CM | POA: Diagnosis present

## 2016-08-23 DIAGNOSIS — I428 Other cardiomyopathies: Secondary | ICD-10-CM | POA: Diagnosis present

## 2016-08-23 DIAGNOSIS — I422 Other hypertrophic cardiomyopathy: Secondary | ICD-10-CM

## 2016-08-23 DIAGNOSIS — Z720 Tobacco use: Secondary | ICD-10-CM | POA: Diagnosis present

## 2016-08-23 DIAGNOSIS — I340 Nonrheumatic mitral (valve) insufficiency: Secondary | ICD-10-CM | POA: Diagnosis present

## 2016-08-23 DIAGNOSIS — I2510 Atherosclerotic heart disease of native coronary artery without angina pectoris: Secondary | ICD-10-CM | POA: Diagnosis present

## 2016-08-23 DIAGNOSIS — Z8249 Family history of ischemic heart disease and other diseases of the circulatory system: Secondary | ICD-10-CM | POA: Diagnosis not present

## 2016-08-23 DIAGNOSIS — F1410 Cocaine abuse, uncomplicated: Secondary | ICD-10-CM | POA: Diagnosis present

## 2016-08-23 DIAGNOSIS — E119 Type 2 diabetes mellitus without complications: Secondary | ICD-10-CM | POA: Diagnosis present

## 2016-08-23 DIAGNOSIS — R7303 Prediabetes: Secondary | ICD-10-CM | POA: Diagnosis present

## 2016-08-23 DIAGNOSIS — F17210 Nicotine dependence, cigarettes, uncomplicated: Secondary | ICD-10-CM

## 2016-08-23 DIAGNOSIS — I509 Heart failure, unspecified: Secondary | ICD-10-CM

## 2016-08-23 DIAGNOSIS — Z823 Family history of stroke: Secondary | ICD-10-CM | POA: Diagnosis not present

## 2016-08-23 DIAGNOSIS — F1490 Cocaine use, unspecified, uncomplicated: Secondary | ICD-10-CM | POA: Diagnosis present

## 2016-08-23 DIAGNOSIS — F1010 Alcohol abuse, uncomplicated: Secondary | ICD-10-CM

## 2016-08-23 DIAGNOSIS — I447 Left bundle-branch block, unspecified: Secondary | ICD-10-CM | POA: Diagnosis present

## 2016-08-23 DIAGNOSIS — R300 Dysuria: Secondary | ICD-10-CM

## 2016-08-23 DIAGNOSIS — H538 Other visual disturbances: Secondary | ICD-10-CM | POA: Diagnosis present

## 2016-08-23 DIAGNOSIS — Z9119 Patient's noncompliance with other medical treatment and regimen: Secondary | ICD-10-CM | POA: Diagnosis not present

## 2016-08-23 DIAGNOSIS — I5021 Acute systolic (congestive) heart failure: Secondary | ICD-10-CM | POA: Diagnosis present

## 2016-08-23 DIAGNOSIS — I10 Essential (primary) hypertension: Secondary | ICD-10-CM

## 2016-08-23 DIAGNOSIS — N179 Acute kidney failure, unspecified: Secondary | ICD-10-CM | POA: Diagnosis present

## 2016-08-23 DIAGNOSIS — F1910 Other psychoactive substance abuse, uncomplicated: Secondary | ICD-10-CM | POA: Diagnosis present

## 2016-08-23 DIAGNOSIS — R350 Frequency of micturition: Secondary | ICD-10-CM

## 2016-08-23 DIAGNOSIS — Z79899 Other long term (current) drug therapy: Secondary | ICD-10-CM

## 2016-08-23 LAB — HEPATIC FUNCTION PANEL
ALT: 34 U/L (ref 17–63)
AST: 39 U/L (ref 15–41)
Albumin: 2.5 g/dL — ABNORMAL LOW (ref 3.5–5.0)
Alkaline Phosphatase: 82 U/L (ref 38–126)
Bilirubin, Direct: 0.2 mg/dL (ref 0.1–0.5)
Indirect Bilirubin: 0.7 mg/dL (ref 0.3–0.9)
Total Bilirubin: 0.9 mg/dL (ref 0.3–1.2)
Total Protein: 5.3 g/dL — ABNORMAL LOW (ref 6.5–8.1)

## 2016-08-23 LAB — RAPID URINE DRUG SCREEN, HOSP PERFORMED
Amphetamines: NOT DETECTED
Barbiturates: NOT DETECTED
Benzodiazepines: NOT DETECTED
Cocaine: POSITIVE — AB
Opiates: NOT DETECTED
Tetrahydrocannabinol: POSITIVE — AB

## 2016-08-23 LAB — BASIC METABOLIC PANEL
Anion gap: 7 (ref 5–15)
BUN: 18 mg/dL (ref 6–20)
CO2: 26 mmol/L (ref 22–32)
Calcium: 8.5 mg/dL — ABNORMAL LOW (ref 8.9–10.3)
Chloride: 110 mmol/L (ref 101–111)
Creatinine, Ser: 1.67 mg/dL — ABNORMAL HIGH (ref 0.61–1.24)
GFR calc Af Amer: 49 mL/min — ABNORMAL LOW (ref >60)
GFR calc non Af Amer: 42 mL/min — ABNORMAL LOW (ref >60)
Glucose, Bld: 95 mg/dL (ref 65–99)
Potassium: 3.8 mmol/L (ref 3.5–5.1)
Sodium: 143 mmol/L (ref 135–145)

## 2016-08-23 LAB — URINALYSIS, ROUTINE W REFLEX MICROSCOPIC
Bilirubin Urine: NEGATIVE
Glucose, UA: NEGATIVE mg/dL
Hgb urine dipstick: NEGATIVE
Ketones, ur: NEGATIVE mg/dL
Leukocytes, UA: NEGATIVE
Nitrite: NEGATIVE
Protein, ur: NEGATIVE mg/dL
Specific Gravity, Urine: 1.006 (ref 1.005–1.030)
pH: 7 (ref 5.0–8.0)

## 2016-08-23 LAB — MAGNESIUM: Magnesium: 1.9 mg/dL (ref 1.7–2.4)

## 2016-08-23 LAB — TROPONIN I: Troponin I: 0.05 ng/mL (ref <0.03)

## 2016-08-23 LAB — BRAIN NATRIURETIC PEPTIDE: B Natriuretic Peptide: 2218.9 pg/mL — ABNORMAL HIGH (ref 0.0–100.0)

## 2016-08-23 MED ORDER — INFLUENZA VAC SPLIT QUAD 0.5 ML IM SUSY
0.5000 mL | PREFILLED_SYRINGE | INTRAMUSCULAR | Status: AC
  Administered 2016-08-24: 0.5 mL via INTRAMUSCULAR
  Filled 2016-08-23: qty 0.5

## 2016-08-23 MED ORDER — ACETAMINOPHEN 650 MG RE SUPP: 650.0000 mg | Freq: Four times a day (QID) | RECTAL | Status: DC | PRN

## 2016-08-23 MED ORDER — PNEUMOCOCCAL VAC POLYVALENT 25 MCG/0.5ML IJ INJ
0.5000 mL | INJECTION | INTRAMUSCULAR | Status: DC
  Filled 2016-08-23: qty 0.5

## 2016-08-23 MED ORDER — ADULT MULTIVITAMIN W/MINERALS CH
1.0000 | ORAL_TABLET | Freq: Every day | ORAL | Status: DC
  Administered 2016-08-23 – 2016-08-28 (×6): 1 via ORAL
  Filled 2016-08-23 (×6): qty 1

## 2016-08-23 MED ORDER — FUROSEMIDE 10 MG/ML IJ SOLN
40.0000 mg | Freq: Two times a day (BID) | INTRAMUSCULAR | Status: DC
  Administered 2016-08-23 – 2016-08-26 (×7): 40 mg via INTRAVENOUS
  Filled 2016-08-23 (×7): qty 4

## 2016-08-23 MED ORDER — ACETAMINOPHEN 325 MG PO TABS: 650.0000 mg | ORAL_TABLET | Freq: Four times a day (QID) | ORAL | Status: DC | PRN

## 2016-08-23 MED ORDER — SODIUM CHLORIDE 0.9% FLUSH
3.0000 mL | Freq: Two times a day (BID) | INTRAVENOUS | Status: DC
  Administered 2016-08-23 – 2016-08-26 (×7): 3 mL via INTRAVENOUS

## 2016-08-23 MED ORDER — POLYETHYLENE GLYCOL 3350 17 G PO PACK: 17.0000 g | PACK | Freq: Every day | ORAL | Status: DC | PRN

## 2016-08-23 MED ORDER — ENOXAPARIN SODIUM 40 MG/0.4ML ~~LOC~~ SOLN
40.0000 mg | SUBCUTANEOUS | Status: DC
  Administered 2016-08-23 – 2016-08-26 (×4): 40 mg via SUBCUTANEOUS
  Filled 2016-08-23 (×4): qty 0.4

## 2016-08-23 NOTE — Assessment & Plan Note (Addendum)
Patient on medication for a year. Blood pressure today 144/97.  Plan: Restart lisinopril-HCTZ.

## 2016-08-23 NOTE — Patient Instructions (Signed)
We will admit you to the hospital for evaluation and management of your heart failure.

## 2016-08-23 NOTE — Assessment & Plan Note (Addendum)
Patient was evaluated on 08/05/2016 for concerns of shortness of breath and chest pain in the emergency department. Patient was found have an elevated BNP of greater than 1100. EKG at that time showed some ST elevations but negative troponin and this was thought to be consistent with LVH given his long hypertension. Patient was previously treated with antihypertensive therapy but had reportedly not followed up for more than a year due to issues with transportation. It was thought that the patient may require admission for further evaluation and management of his congestive heart failure, however the patient left AMA before workup could be completed. He did receive Lasix and was able to diurese prior to leaving. He was also given a prescription for his antihypertensive medication at that time but did not fill it.  Patient presented today in significant pain from gross bilateral lower extremity edema. There is 3+ pitting in the bilateral lower extremity to the level of the knee and 1+ pitting above the knee. He also has trace pitting of the sacrum There is no edema over the upper extremity. He also complains of significant dyspnea on exertion, he has faint bibasilar rales but pulmonary exam is otherwise unremarkable.  Given the patient's history, I am very concerned for acute exacerbation of heart failure. Currently, he is stable from a pulmonary standpoint and not hypoxic, however he will require significant diuresis. Review of the record demonstrates that his dry weight last fall was around 210 pounds. He is currently at 223 pounds based on patient report it is likely that the majority of this is water weight.  Plan: Check BMP, BNP, chest x-ray. We'll plan to admit the patient for diuresis, monitoring and further workup of his heart failure.

## 2016-08-23 NOTE — H&P (Signed)
Date: 08/23/2016               Patient Name:  Edward Mullen MRN: [id number]  DOB: 1956/04/15 Age / Sex: 61 y.o., male   PCP: Shela Leff, MD         Medical Service: Internal Medicine Teaching Service         Attending Physician: Dr. Axel Filler, MD    First Contact: Dr. Hetty Ely Pager: [phone]  Second Contact: Dr. [name] Pager: [phone]       After Hours (After 5p/  First Contact Pager: [phone]  weekends / holidays): Second Contact Pager: [phone]   Chief Complaint: painful leg swelling   History of Present Illness: Edward Mullen is a 61 y.o. male with a PMH of HTN, DM, and polysubstance abuse who presents to the internal medicine clinic with painful leg swelling. This leg swelling started 12/25 when he presented to [facility] long hospital he was diuresed with IV lasix and his swelling resolved completely. On 1/10 he developed swelling again which continued to worsen and became painful. He has some chest pain which comes and goes and is worse at night.  For the past three months he has had worsening dyspnea and has been able to walk only a street block before becoming short of breath. In this same time period he has also had episodes of shortness of breath during the night which wake him up from sleep every couple of nights. He has been sleeping sitting straight up this week. In this same time period he has developed increased urination, thirst, and episodes of blurred vision. He has been using the restroom every 15 minutes during the day and wakes up more than four times per night to urinate. He describes burning with urination.  He has a chronic cough productive of white phlegm that has been going on for years and has not changed.  He denies hx of MI or stroke.  In the clinic vitals were stable. He weighted 101 kg, up from 95 kg 04/2016. He had BNP 2200 and crt 1.67 and was admitted for volume overload.   Meds:  No outpatient prescriptions have been marked as  taking for the 08/23/16 encounter [facility] Encounter).     Allergies: Allergies as of 08/23/2016  . (No Known Allergies)   Past Medical History:  Diagnosis Date  . Arthritis    "feels like it in my legs" (09/20/2014)  . Borderline type 2 diabetes mellitus   . Hypertension     Family History:  mother- stroke at age 70  Denies family hx of MI or kidney disease.   Social History:  Smokes 2 cigarettes per day currently, has smokes 1.5 packs per week since age 15. He drinks 1 beer every other day, last drink was Sunday. He last used crack 2 weeks ago.   Review of Systems: A complete ROS was negative except as per HPI.   Physical Exam: There were no vitals taken for this visit. Physical Exam  Constitutional: He is oriented to person, place, and time. He appears well-developed and well-nourished. No distress.  HENT:  Head: Normocephalic and atraumatic.  Eyes: Conjunctivae are normal. No scleral icterus.  Cardiovascular: Normal rate and regular rhythm.   No murmur heard. 3+ b/l lower extremity pitting edema  Pulmonary/Chest: Effort normal and breath sounds normal. No respiratory distress. He has no wheezes. He has no rales.  Abdominal: Soft. He exhibits no distension. There is no tenderness.  Neurological:  He is alert and oriented to person, place, and time.  Skin: Skin is warm and dry. He is not diaphoretic.    Assessment & Plan by Problem:    CHF (congestive heart failure) (HCC)   CHF exacerbation ([place])  61 y.o. male with a PMH of HTN, DM, and polysubstance abuse who presents to the internal medicine clinic with painful leg swelling. Hx of leg swelling with previous response to lasix, PND, and orthopnea. Was found to have BNP 2200 with extensive lower extremity edema on exam. He has no prior hx of CHF or MI but history and exam are consistent with CHF exacerbation. He will need an echocardiogram to confirm. If he does have CHF, ischemic etiology is concerning given his hx of  htn, tobacco abuse, and crack cocaine use.  -follow up echo -follow up EKG and troponin  -follow up Chest xray  - follow up CMP  -follow up lipid panel and A1c  -ordered IV lasix 40 mg BID  -telemetry monitoring  -daily weights, strict I&Os   Polysubstance abuse  - follow up UDS  -ordered MVM     HTN (hypertension) Hypertensive on presentation. Has not been on any medications for this. Will monitor for improvement with diuresis.     AKI (acute kidney injury) ([place]) Crt 1.6 up from baseline 1.1-1.2. His crt was also elevated at last ED presentation for volume overload. May be cardiorenal secondary to decreased perfusion in the setting of volume overload.  -avoid nephrotoxic agents  -follow up morning CMP   Dysuria and increased urinary frequency Urinary tract infection vs. New onset DM.  - ordered urinalysis  -follow up A1c   DVT Ppx Lovenox  Code Status FULL   Dispo: Admit patient to Inpatient with expected length of stay greater than 2 midnights.  Signed: Ledell Noss, MD 08/23/2016, 7:52 PM  Pager: [phone]

## 2016-08-23 NOTE — Progress Notes (Signed)
CC: swollen feet HPI: [name] is a 61 y.o. male with a h/o of HTN and borderline DM who presents w/ a complaint of swollen feet.  Please see Problem-based charting for HPI and the status of patient's chronic medical conditions.  Past Medical History:  Diagnosis Date  . Arthritis    "feels like it in my legs" (09/20/2014)  . Borderline type 2 diabetes mellitus   . Hypertension     Social Hx: Social History  Substance Use Topics  . Smoking status: Current Every Day Smoker    Packs/day: 0.30    Years: 48.00    Types: Cigarettes  . Smokeless tobacco: Never Used     Comment: cutting back 2 per day  . Alcohol use 3.6 oz/week    6 Cans of beer per week   Review of Systems: ROS in HPI. Otherwise: Review of Systems  Constitutional: Negative for chills, fever and weight loss.  Respiratory: Positive for cough, sputum production and shortness of breath. Negative for hemoptysis and wheezing.   Cardiovascular: Positive for orthopnea, leg swelling and PND. Negative for chest pain and palpitations.  Gastrointestinal: Negative for abdominal pain, constipation, diarrhea, nausea and vomiting.  Genitourinary: Positive for frequency. Negative for dysuria, flank pain and urgency.  Musculoskeletal: Negative for falls and myalgias.  Neurological: Negative for dizziness and headaches.  Endo/Heme/Allergies: Positive for polydipsia.    Physical Exam: Vitals:   08/23/16 1452  BP: (!) 144/97  Pulse: 93  Temp: 97.4 F (36.3 C)  TempSrc: Oral  SpO2: 99%  Weight: 223 lb 1.6 oz (101.2 kg)  Height: 6' (1.829 m)   Physical Exam  Constitutional: He is oriented to person, place, and time. He is cooperative. He appears distressed (mild).  Cardiovascular: Normal rate, regular rhythm, normal heart sounds and normal pulses.  Exam reveals no gallop.   No murmur heard. Pulmonary/Chest: Effort normal. No respiratory distress. He has no wheezes. He has no rhonchi. He has rales (faint  bibasilar).  Abdominal: Soft. Bowel sounds are normal. There is no tenderness.  Musculoskeletal: He exhibits edema (3+ to the knees BLE, 1+ above the knees, trace sacral edema) and tenderness.  Neurological: He is alert and oriented to person, place, and time.  Skin: Skin is intact.    Assessment & Plan:  See encounters tab for problem based medical decision making. Patient seen with Dr. Lynnae January  CHF (congestive heart failure) [facility]) Patient was evaluated on 08/05/2016 for concerns of shortness of breath and chest pain in the emergency department. Patient was found have an elevated BNP of greater than 1100. EKG at that time showed some ST elevations but negative troponin and this was thought to be consistent with LVH given his long hypertension. Patient was previously treated with antihypertensive therapy but had reportedly not followed up for more than a year due to issues with transportation. It was thought that the patient may require admission for further evaluation and management of his congestive heart failure, however the patient left AMA before workup could be completed. He did receive Lasix and was able to diurese prior to leaving. He was also given a prescription for his antihypertensive medication at that time but did not fill it.  Patient presented today in significant pain from gross bilateral lower extremity edema. There is 3+ pitting in the bilateral lower extremity to the level of the knee and 1+ pitting above the knee. He also has trace pitting of the sacrum There is no edema over the upper  extremity. He also complains of significant dyspnea on exertion, he has faint bibasilar rales but pulmonary exam is otherwise unremarkable.  Given the patient's history, I am very concerned for acute exacerbation of heart failure. Currently, he is stable from a pulmonary standpoint and not hypoxic, however he will require significant diuresis. Review of the record demonstrates that his dry weight  last fall was around 210 pounds. He is currently at 223 pounds based on patient report it is likely that the majority of this is water weight.  Plan: Check BMP, BNP, chest x-ray. We'll plan to admit the patient for diuresis, monitoring and further workup of his heart failure.  HTN (hypertension) Patient on medication for a year. Blood pressure today 144/97.  Plan: Restart lisinopril-HCTZ.  Borderline diabetes Hemoglobin A1c last year was 6.2.Patient complains of polyuria and polydipsia today. He reports that he has had 5-6 episodes of nocturia. He is also been drinking lots of soda during the day 4-5 1 L bottles to quench his thirst.  Plan: Check hemoglobin A1c today.   Signed: Holley Raring, MD 08/23/2016, 4:27 PM  Pager: [phone]

## 2016-08-23 NOTE — Progress Notes (Signed)
Report called to [place] on [address].  Patient continues to be alert oriented and in no distress.  Patient transported via wheelchair to [address].  Sander Nephew, RN 08/24/2016 4:46 PM

## 2016-08-23 NOTE — Progress Notes (Signed)
CRITICAL VALUE ALERT  Critical value received:  Troponin 0.05   Date of notification:  08/23/16  Time of notification:  2042  Critical value read back:Yes.    Nurse who received alert:  Darreld Mclean, RN  MD notified (1st page): [phone]  Time of first page:  2041  MD notified (2nd page): NA  Time of second page: NA  Responding MD:  Dr. Hetty Ely  Time MD responded:  2042

## 2016-08-23 NOTE — Assessment & Plan Note (Addendum)
Hemoglobin A1c last year was 6.2.Patient complains of polyuria and polydipsia today. He reports that he has had 5-6 episodes of nocturia. He is also been drinking lots of soda during the day 4-5 1 L bottles to quench his thirst.  Plan: Check hemoglobin A1c today.

## 2016-08-24 ENCOUNTER — Other Ambulatory Visit (HOSPITAL_COMMUNITY): Payer: Self-pay

## 2016-08-24 DIAGNOSIS — F1910 Other psychoactive substance abuse, uncomplicated: Secondary | ICD-10-CM | POA: Diagnosis present

## 2016-08-24 DIAGNOSIS — E876 Hypokalemia: Secondary | ICD-10-CM

## 2016-08-24 LAB — TROPONIN I
Troponin I: 0.05 ng/mL (ref <0.03)
Troponin I: 0.04 ng/mL (ref <0.03)

## 2016-08-24 LAB — COMPREHENSIVE METABOLIC PANEL
ALT: 32 U/L (ref 17–63)
AST: 36 U/L (ref 15–41)
Albumin: 2.5 g/dL — ABNORMAL LOW (ref 3.5–5.0)
Alkaline Phosphatase: 78 U/L (ref 38–126)
Anion gap: 8 (ref 5–15)
BUN: 17 mg/dL (ref 6–20)
CO2: 25 mmol/L (ref 22–32)
Calcium: 8 mg/dL — ABNORMAL LOW (ref 8.9–10.3)
Chloride: 109 mmol/L (ref 101–111)
Creatinine, Ser: 1.52 mg/dL — ABNORMAL HIGH (ref 0.61–1.24)
GFR calc Af Amer: 55 mL/min — ABNORMAL LOW (ref >60)
GFR calc non Af Amer: 47 mL/min — ABNORMAL LOW (ref >60)
Glucose, Bld: 89 mg/dL (ref 65–99)
Potassium: 3.3 mmol/L — ABNORMAL LOW (ref 3.5–5.1)
Sodium: 142 mmol/L (ref 135–145)
Total Bilirubin: 0.9 mg/dL (ref 0.3–1.2)
Total Protein: 5.4 g/dL — ABNORMAL LOW (ref 6.5–8.1)

## 2016-08-24 LAB — HEMOGLOBIN A1C
Hgb A1c MFr Bld: 5.6 % (ref 4.8–5.6)
Mean Plasma Glucose: 114 mg/dL

## 2016-08-24 LAB — TSH: TSH: 0.791 u[IU]/mL (ref 0.350–4.500)

## 2016-08-24 LAB — GLUCOSE, CAPILLARY: Glucose-Capillary: 82 mg/dL (ref 65–99)

## 2016-08-24 LAB — CBC
HCT: 36.5 % — ABNORMAL LOW (ref 39.0–52.0)
Hemoglobin: 12 g/dL — ABNORMAL LOW (ref 13.0–17.0)
MCH: 26.5 pg (ref 26.0–34.0)
MCHC: 32.9 g/dL (ref 30.0–36.0)
MCV: 80.8 fL (ref 78.0–100.0)
Platelets: 171 10*3/uL (ref 150–400)
RBC: 4.52 MIL/uL (ref 4.22–5.81)
RDW: 14.8 % (ref 11.5–15.5)
WBC: 7.7 10*3/uL (ref 4.0–10.5)

## 2016-08-24 MED ORDER — POTASSIUM CHLORIDE CRYS ER 20 MEQ PO TBCR
40.0000 meq | EXTENDED_RELEASE_TABLET | Freq: Two times a day (BID) | ORAL | Status: DC
  Administered 2016-08-24 – 2016-08-25 (×3): 40 meq via ORAL
  Filled 2016-08-24 (×3): qty 2

## 2016-08-24 MED ORDER — ENSURE ENLIVE PO LIQD
237.0000 mL | Freq: Two times a day (BID) | ORAL | Status: DC
  Administered 2016-08-24 – 2016-08-28 (×7): 237 mL via ORAL
  Filled 2016-08-24 (×6): qty 237

## 2016-08-24 MED ORDER — POTASSIUM CHLORIDE CRYS ER 20 MEQ PO TBCR: 40.0000 meq | EXTENDED_RELEASE_TABLET | Freq: Two times a day (BID) | ORAL | Status: DC

## 2016-08-24 NOTE — Evaluation (Signed)
Physical Therapy Evaluation Patient Details Name: Edward Mullen MRN: [id number] DOB: [birth year] Today's Date: 08/24/2016   History of Present Illness  61 yo male wtih onset of severe LE edema and pain across L hip was admitted with CHF, has elevated but declining troponins, PMHx:  DM, HTN, smoker, CHF, polysubstance abuse  Clinical Impression  Pt is up to walk with PT, noted some limping from calves hurting so added walker. Will continue acutely to check out stairs next visit pending reduced leg pain, and will then be able to clear for dc home.  No PT recommended for home as pt is not reporting issues with being able to get around, and is mainly limited by leg pain that has no orthopedic origin.    Follow Up Recommendations No PT follow up    Equipment Recommendations  Rolling walker with 5" wheels    Recommendations for Other Services       Precautions / Restrictions Precautions Precautions: Fall (telemetry) Restrictions Weight Bearing Restrictions: No      Mobility  Bed Mobility Overal bed mobility: Modified Independent                Transfers Overall transfer level: Modified independent                  Ambulation/Gait Ambulation/Gait assistance: Min guard Ambulation Distance (Feet): 150 Feet Assistive device: Rolling walker (2 wheeled);1 person hand held assist Gait Pattern/deviations: Step-through pattern;Decreased stride length;Wide base of support;Trunk flexed Gait velocity: reduced Gait velocity interpretation: Below normal speed for age/gender General Gait Details: added RW to gait due to pain in legs and limping  Stairs            Wheelchair Mobility    Modified Rankin (Stroke Patients Only)       Balance Overall balance assessment: Needs assistance Sitting-balance support: Feet supported Sitting balance-Leahy Scale: Good     Standing balance support: Bilateral upper extremity supported Standing balance-Leahy Scale: Fair                                Pertinent Vitals/Pain Pain Assessment: 0-10 Pain Score: 8  Pain Location: calves Pain Descriptors / Indicators: Cramping Pain Intervention(s): Monitored during session;Limited activity within patient's tolerance;Repositioned;Other (comment) (encouraged elevation of edematous limbs)    Home Living Family/patient expects to be discharged to:: Private residence Living Arrangements: Children;Other relatives Available Help at Discharge: Family;Available PRN/intermittently Type of Home: House Home Access: Stairs to enter Entrance Stairs-Rails: Right;Left;Can reach both Entrance Stairs-Number of Steps: 15 Home Layout: One level Home Equipment: Cane - single point Additional Comments: has been worried about being practically able to stay there    Prior Function Level of Independence: Independent with assistive device(s)               Hand Dominance        Extremity/Trunk Assessment   Upper Extremity Assessment Upper Extremity Assessment: Overall WFL for tasks assessed    Lower Extremity Assessment Lower Extremity Assessment: Overall WFL for tasks assessed    Cervical / Trunk Assessment Cervical / Trunk Assessment: Normal  Communication   Communication: No difficulties  Cognition Arousal/Alertness: Awake/alert Behavior During Therapy: WFL for tasks assessed/performed Overall Cognitive Status: Within Functional Limits for tasks assessed                      General Comments      Exercises  Assessment/Plan    PT Assessment Patient needs continued PT services  PT Problem List Decreased strength;Decreased range of motion;Decreased activity tolerance;Decreased balance;Decreased mobility;Decreased coordination;Decreased knowledge of use of DME;Decreased safety awareness;Cardiopulmonary status limiting activity;Pain          PT Treatment Interventions DME instruction;Gait training;Stair training;Functional  mobility training;Therapeutic activities;Therapeutic exercise;Balance training;Neuromuscular re-education;Patient/family education    PT Goals (Current goals can be found in the Care Plan section)  Acute Rehab PT Goals Patient Stated Goal: to get to a better home situation with fewer stairs PT Goal Formulation: With patient Time For Goal Achievement: 09/07/16 Potential to Achieve Goals: Good    Frequency Min 3X/week   Barriers to discharge Inaccessible home environment 15 stairs to enter house    Co-evaluation               End of Session Equipment Utilized During Treatment: Gait belt Activity Tolerance: Patient tolerated treatment well;Patient limited by pain Patient left: in bed;with call bell/phone within reach Nurse Communication: Mobility status;Other (comment) (reported pulse at 96 end of gait, started 100)         Time: 1311-1335 PT Time Calculation (min) (ACUTE ONLY): 24 min   Charges:   PT Evaluation $PT Eval Low Complexity: 1 Procedure PT Treatments $Gait Training: 8-22 mins   PT G Codes:        Ramond Dial 07-Sep-2016, 1:48 PM   Mee Hives, PT MS Acute Rehab Dept. Number: [place] and [place]

## 2016-08-24 NOTE — Progress Notes (Signed)
PT Cancellation Note  Patient Details Name: Edward Mullen MRN: [id number] DOB: 15-Apr-1956   Cancelled Treatment:    Reason Eval/Treat Not Completed: Other (comment).  PT arrived and pt was up in chair with O2 in place, had a male visitor who was arguing withhim.  PT asked pt if he would work with her, and pt agreed.  Then male visitor became angry and said, " So I just got here and you are going to walk?"  Pt then refused visit and the visitor turned to the PT and stated in a hostile way, "Is that a problem to come back?"  PT left and reporting to nursing.   Ramond Dial 08/24/2016, 11:37 AM   Mee Hives, PT MS Acute Rehab Dept. Number: [place] and [place]

## 2016-08-24 NOTE — Progress Notes (Signed)
LCSW consulted for current SA. Patient positive for cocaine and THC. At this time, consult deferred due to patient not wanting information or reports problems. Patient too medically acute for inpatient substance abuse referrals or resources. If patient changes his mind about outpatient resources please call SW and we will revisit with options in the outpatient.  Will sign off for now.  Lane Hacker, MSW Clinical Social Work: [employer] Coverage for :  [phone]

## 2016-08-24 NOTE — Progress Notes (Addendum)
   Subjective: Feeling better overall, still with significant volume excess.  Further describes symptoms consistent with HF ongoing for several months such as DOE and orthopnea.  Reports unable to walk up flight of stairs with out being short of breath.  He also describes chest pressure with this exertion.  Both of which resolve with rest.  Objective:  Vital signs in last 24 hours: Vitals:   08/23/16 2025 08/23/16 2130 08/23/16 2256 08/24/16 0607  BP:  (!) 165/80 (!) 131/95 125/89  Pulse:  82 94 83  Resp:  20 19 18   Temp:  98.4 F (36.9 C) 97.7 F (36.5 C) 97.7 F (36.5 C)  TempSrc:  Oral Oral Oral  SpO2:  93% 96% 94%  Weight:  216 lb 11.2 oz (98.3 kg)  211 lb 3.2 oz (95.8 kg)  Height: 6' (1.829 m) 6' (1.829 m)     General: resting in bed, no distress, breathing comfortably on ambient air HEENT: EOMI, no scleral icterus, NCAT Neck: JVD present Cardiac: RRR, positive holosystolic murmur at apex Pulm: clear to auscultation bilaterally, moving normal volumes of air Abd: soft, nontender Ext: warm and well perfused, 2+ pitting edema bilaterally Neuro: alert and oriented X3, cranial nerves II-XII grossly intact   Assessment/Plan:  Principal Problem:   CHF (congestive heart failure) (HCC) Active Problems:   HTN (hypertension)   Tobacco abuse   Borderline diabetes   AKI (acute kidney injury) (HCC)   CHF exacerbation (HCC)   Polysubstance abuse   CHF exacerbation (HCC) Volume overload DOE, orthopnea Hypokalemia History consistent with heart failure in gentleman with risk factors for developing reduced EF cardiomyopathy (EtOH and Cocaine use) and at risk for ischemic etiology with history of untreated HTN and tobacco use.  CXR with mild cardiac enlargement.  A1c is 5.6.   Weight is down 5 pounds since admission.  Net negative 1.6 liters. - await echo - f/u lipid panel and TSH - K Dur 10mEq BID.  Daily BMET and magnesium - telemetry - daily weights, strict  I/O's  Polysubstance abuse  - UDS cocaine positive - CSW consult    HTN (hypertension) Hypertensive on presentation. Has not been on any medications for this.  Improving with volume removal.     AKI (acute kidney injury) ([place]) Crt 1.6 up from baseline 1.1-1.2. His crt was also elevated at last ED presentation for volume overload. May be cardiorenal secondary to decreased perfusion in the setting of volume overload.  Improved to 1.5 with diuresis -avoid nephrotoxic agents    DVT Ppx Lovenox   Code Status FULL   Dispo: Anticipated discharge in approximately 3-4 day(s).   Ledell Noss, MD 08/24/2016, 12:23 PM Pager: [phone]   Internal Medicine Attending:   I saw and examined the patient on 08/25/16. I reviewed the resident's note and I agree with the resident's findings and plan as documented in the resident's note.

## 2016-08-25 ENCOUNTER — Inpatient Hospital Stay (HOSPITAL_COMMUNITY): Payer: Medicaid Other

## 2016-08-25 DIAGNOSIS — I5021 Acute systolic (congestive) heart failure: Secondary | ICD-10-CM | POA: Diagnosis present

## 2016-08-25 DIAGNOSIS — I5023 Acute on chronic systolic (congestive) heart failure: Secondary | ICD-10-CM

## 2016-08-25 DIAGNOSIS — N179 Acute kidney failure, unspecified: Secondary | ICD-10-CM

## 2016-08-25 DIAGNOSIS — I509 Heart failure, unspecified: Secondary | ICD-10-CM

## 2016-08-25 LAB — BASIC METABOLIC PANEL
Anion gap: 8 (ref 5–15)
BUN: 18 mg/dL (ref 6–20)
CO2: 27 mmol/L (ref 22–32)
Calcium: 8.3 mg/dL — ABNORMAL LOW (ref 8.9–10.3)
Chloride: 104 mmol/L (ref 101–111)
Creatinine, Ser: 1.38 mg/dL — ABNORMAL HIGH (ref 0.61–1.24)
GFR calc Af Amer: >60 mL/min (ref >60)
GFR calc non Af Amer: 53 mL/min — ABNORMAL LOW (ref >60)
Glucose, Bld: 87 mg/dL (ref 65–99)
Potassium: 4.1 mmol/L (ref 3.5–5.1)
Sodium: 139 mmol/L (ref 135–145)

## 2016-08-25 LAB — LIPID PANEL
Cholesterol: 115 mg/dL (ref 0–200)
HDL: 28 mg/dL — ABNORMAL LOW (ref >40)
LDL Cholesterol: 75 mg/dL (ref 0–99)
Total CHOL/HDL Ratio: 4.1 RATIO
Triglycerides: 62 mg/dL (ref <150)
VLDL: 12 mg/dL (ref 0–40)

## 2016-08-25 LAB — ECHOCARDIOGRAM COMPLETE
Height: 72 in
Weight: 3329.83 oz

## 2016-08-25 LAB — MAGNESIUM: Magnesium: 1.8 mg/dL (ref 1.7–2.4)

## 2016-08-25 MED ORDER — SPIRONOLACTONE 25 MG PO TABS
12.5000 mg | ORAL_TABLET | Freq: Every day | ORAL | Status: DC
  Administered 2016-08-25: 15:00:00 via ORAL
  Administered 2016-08-26: 12.5 mg via ORAL
  Filled 2016-08-25 (×2): qty 1

## 2016-08-25 MED ORDER — LOSARTAN POTASSIUM 50 MG PO TABS
25.0000 mg | ORAL_TABLET | Freq: Every day | ORAL | Status: DC
  Filled 2016-08-25: qty 1

## 2016-08-25 MED ORDER — POTASSIUM CHLORIDE CRYS ER 20 MEQ PO TBCR
20.0000 meq | EXTENDED_RELEASE_TABLET | Freq: Two times a day (BID) | ORAL | Status: DC
  Administered 2016-08-25 – 2016-08-28 (×6): 20 meq via ORAL
  Filled 2016-08-25 (×6): qty 1

## 2016-08-25 MED ORDER — SENNOSIDES-DOCUSATE SODIUM 8.6-50 MG PO TABS
2.0000 | ORAL_TABLET | Freq: Every day | ORAL | Status: DC
  Administered 2016-08-25 – 2016-08-27 (×4): 2 via ORAL
  Filled 2016-08-25 (×4): qty 2

## 2016-08-25 MED ORDER — LISINOPRIL 5 MG PO TABS
5.0000 mg | ORAL_TABLET | Freq: Every day | ORAL | Status: DC
  Administered 2016-08-25 – 2016-08-26 (×2): 5 mg via ORAL
  Filled 2016-08-25 (×2): qty 1

## 2016-08-25 NOTE — Progress Notes (Signed)
  Echocardiogram 2D Echocardiogram has been performed.  Edward Mullen 08/25/2016, 12:04 PM

## 2016-08-25 NOTE — Progress Notes (Signed)
   Subjective:  Continues to feel better.  Swelling is improved.  Still notes SOB with lying in certain positions that resolves upon repositioning.  No chest pain this AM.  Nursing reports 5 beat run of asymptomatic V-tach earlier this AM.  Objective:  Vital signs in last 24 hours: Vitals:   08/24/16 2148 08/25/16 0018 08/25/16 0243 08/25/16 0500  BP: (!) 131/91 (!) 123/92  116/78  Pulse: 84 91  80  Resp: 18 16  19   Temp:  97.6 F (36.4 C)  98.1 F (36.7 C)  TempSrc:    Oral  SpO2: 100% 100%  98%  Weight:   208 lb 1.8 oz (94.4 kg)   Height:       General: sitting up in bed, no distress, breathing comfortably on ambient air HEENT: EOMI, no scleral icterus, NCAT Neck: JVD present Cardiac: RRR, positive holosystolic murmur at apex Pulm: clear to auscultation bilaterally, moving normal volumes of air Ext: warm and well perfused, 1-2+ pitting edema bilaterally improving Neuro: alert and oriented X3, cranial nerves II-XII grossly intact   Assessment/Plan:  Principal Problem:   CHF (congestive heart failure) (HCC) Active Problems:   HTN (hypertension)   Tobacco abuse   Borderline diabetes   AKI (acute kidney injury) (HCC)   CHF exacerbation (HCC)   Polysubstance abuse   CHF exacerbation (HCC) Volume overload DOE, orthopnea Hypokalemia History consistent with heart failure in gentleman with risk factors for developing reduced EF cardiomyopathy (EtOH and Cocaine use) and at risk for ischemic etiology with history of untreated HTN and tobacco use.  CXR with mild cardiac enlargement.  A1c is 5.6.   Weight is down 8 pounds since admission.  Net negative 1.7 liters. - await echo - f/u lipid panel  - TSH normal - K Dur 41mEq BID.  Careful monitoring with daily BMET and magnesium during supplementation - telemetry - daily weights, strict I/O's -PT eval noted no need for f/u.  Recommend rolling walker at d/c  Polysubstance abuse  - UDS cocaine positive - CSW consult  appreciated   HTN (hypertension) Hypertensive on presentation. Has not been on any medications for this.  Improving with volume removal.  116/78 this morning.    AKI (acute kidney injury) ([place]) Creatinine 1.6 at admission up from baseline 1.1-1.2. Improving with diuresis thus far.  BMET pending this morning. -avoid nephrotoxic agents    DVT Ppx Lovenox   Code Status FULL   Dispo: Anticipated discharge in approximately 3-4 day(s).   Jule Ser, DO 08/25/2016, 8:09 AM Pager: [phone]

## 2016-08-25 NOTE — Progress Notes (Signed)
Patient was complaining of blurry vision. He described this episode as a haziness in his vision. I evaluated the patient and he stated that his blurriness had improved. At that time his vital signs were stable. His neurological examination was normal. He was denying headache, chest pain or shortness of breath.  The patient reports a history of similar events occurring over the previous several months. He states he'll have episodes where his vision gets blurry and will last approximately 10-15 minutes. The symptoms always resolve on their own. They are isolated and not associated with headache, shortness of breath or chest pain. At this time the patient is clinically stable and I'm not concerned for any acute pathology.

## 2016-08-25 NOTE — Consult Note (Signed)
CARDIOLOGY CONSULT NOTE  Patient ID: Edward Mullen MRN: [id number] DOB/AGE: January 13, 1956 60 y.o.  Admit date: 08/23/2016 Primary Physician: Dr. Marlowe Mullen Referring: Dr. [name] Primary Cardiologist: None Reason for Consultation: CHF  HPI: 61 yo with history of HTN, DM and polysubstance abuse as well as medical noncompliance was directly admitted from internal medicine clinic on 08/23/16 for management of CHF. Patient reports exertional dyspnea and lower leg swelling for several months now.  Symptoms have become progressively worse.  He has episodes of chest pain, these are atypical and not related to exertion.  He was seen at the ER at [facility] in 12/17 with volume overload/CHF.  Admission was recommended but he left AMA.  He was seen 08/23/16 at internal medicine clinic and was admitted due to ongoing volume overload/CHF and severe leg edema.  He says that he has been short of breath walking short distances around his house.  He was having orthopnea and PND episodes.  He has been on no meds at home.  He was admitted and started on IV Lasix.  It does not appear that I/Os are being measured but weight is coming down.  Echo was done showing EF 15-20% with moderate AI and moderate to severe MR.   Review of systems complete and found to be negative unless listed above in HPI  Past Medical History: 1. HTN: Not on any meds at home.  2. Type II diabetes 3. Cocaine abuse 4. Active smoking 5. Cardiomyopathy: Uncertain etiology.  - Echo (1/18) with EF 15-20%, moderate LVH, moderate AI, moderate to severe MR, normal RV size with mildly decreased systolic function, PASP 44 mmHg.   Family History  Problem Relation Age of Onset  . Stroke Mother 64  . Heart disease Father 28    Social History   Social History  . Marital status: Married    Spouse name: N/A  . Number of children: N/A  . 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 Smoking status: Current Every Day Smoker    Packs/day: 0.30    Years: 48.00    Types: Cigarettes  . Smokeless tobacco: Never Used     Comment: cutting back 2 per day  . Alcohol use 3.6 oz/week    6 Cans of beer per week  . Drug use:     Types: Marijuana, Cocaine  . Sexual activity: Yes    Birth control/ protection: None   Other Topics Concern  . Not on file   Social History Narrative  . No narrative on file     No prescriptions prior to admission.   Current Scheduled Meds: . enoxaparin (LOVENOX) injection  40 mg Subcutaneous Q24H  . feeding supplement (ENSURE ENLIVE)  237 mL Oral BID BM  . furosemide  40 mg Intravenous BID  . losartan  25 mg Oral Daily  . multivitamin with minerals  1 tablet Oral Daily  . pneumococcal 23 valent vaccine  0.5 mL Intramuscular Tomorrow-1000  . potassium chloride  20 mEq Oral BID  . senna-docusate  2 tablet Oral QHS  . sodium chloride flush  3 mL Intravenous Q12H  . spironolactone  12.5 mg Oral Daily   Continuous Infusions: PRN Meds:.acetaminophen **OR** acetaminophen, polyethylene glycol  Physical exam Blood pressure 116/78, pulse 80, temperature 98.1 F (36.7 C), temperature source Oral, resp.  rate 19, height 6' (1.829 m), weight 208 lb 1.8 oz (94.4 kg), SpO2 98 %. General: NAD Neck: JVP 14 cm, no thyromegaly or thyroid nodule.  Lungs: Clear to auscultation bilaterally with normal respiratory effort. CV: Lateral PMI.  Heart regular S1/S2, no S3/S4, 3/6 HSM apex.  1+ edema 1/2 to knee L>R.  No carotid bruit.  Normal pedal pulses.  Abdomen: Soft, nontender, no hepatosplenomegaly, no distention.  Skin: Intact without lesions or rashes.  Neurologic: Alert and oriented x 3.  Psych: Normal affect. Extremities: No clubbing or cyanosis.  HEENT: Normal.   Labs:   Lab Results  Component Value Date   WBC 7.7 08/24/2016   HGB 12.0 (L) 08/24/2016   HCT 36.5 (L) 08/24/2016   MCV  80.8 08/24/2016   PLT 171 08/24/2016    Recent Labs Lab 08/24/16 0251 08/25/16 0631  NA 142 139  K 3.3* 4.1  CL 109 104  CO2 25 27  BUN 17 18  CREATININE 1.52* 1.38*  CALCIUM 8.0* 8.3*  PROT 5.4*  --   BILITOT 0.9  --   ALKPHOS 78  --   ALT 32  --   AST 36  --   GLUCOSE 89 87   Lab Results  Component Value Date   TROPONINI 0.04 ([place]) 08/24/2016     Radiology: - CXR: Mild cardiac enlargement, no edema.  EKG: NSR, LVH with repolarization abnormality.   ASSESSMENT AND PLAN: 61 yo with history of HTN, DM and polysubstance abuse as well as medical noncompliance was admitted with acute systolic CHF.  1. Acute on chronic systolic CHF: EF 0000000 with moderate to severe MR on echo.  Cause of cardiomyopathy is uncertain.  He has hypertension and has not been on meds, so this may contribute.  He uses cocaine (UDS positive at admission), this could play a role.  He says that he does not drink heavily now.  Possible viral myocarditis.  Finally, he has risk factors for CAD so ischemic cardiomyopathy is certainly a possibility.  He has a history of atypical chest pain.  On exam, he remains volume overloaded.  BP is stable.  - Add lisinopril 5 mg daily and spironolactone 12.5 daily.  - Continue Lasix 40 mg IV bid as weight appears to be coming down.  I/Os not measured, will ask that nursing keep up with this since it was ordered at admission.  - When volume status looks better, would start him on Coreg 3.125 mg bid. Probably not a high risk of interaction with cocaine (has alpha blocking affect in addition to beta blockade), but will warn him about the possibility for interaction.  - Needs to stop cocaine and ETOH (though says he is not a heavy drinker at this point).  - Check HIV and SPEP.  - He will need ischemic workup.  Once he is more fully diuresed, would plan right and left heart cath to assess for coronary disease and assess filling pressures/cardiac output.   2. AKI: Elevated creatinine  at admission, this is coming down with diuresis (likely with lowering of renal venous pressure).  Follow closely.  3. HTN: No meds at admission.  BP has not been significantly elevated.  4. Polysubstance abuse: Needs to stop cocaine, ETOH, and smoking.   Loralie Champagne 08/25/2016 3:08 PM    Signed: Loralie Champagne 08/25/2016 2:37 PM

## 2016-08-25 NOTE — Progress Notes (Signed)
Pt c/o blurred and hazy vision. Pt able to see how many fingers RN was holding up but says looks like a haze was over them. On call MD was paged and notified. MD to come see pt. VSS. Will continue to monitor pt.

## 2016-08-25 NOTE — Evaluation (Signed)
Occupational Therapy Evaluation and Discharge Patient Details Name: Edward Mullen MRN: [id number] DOB: Mar 11, 1956 Today's Date: 08/25/2016    History of Present Illness 61 yo male wtih onset of severe LE edema and pain across L hip was admitted with CHF, has elevated but declining troponins, PMHx:  DM, HTN, smoker, CHF, polysubstance abuse   Clinical Impression   Pt reports he was independent with BADL PTA; daughter does grocery shopping and helps with household chores. Currently pt overall supervision for ADL and functional mobility. Educated pt on energy conservation strategies and provided handout; pt verbalized understanding. Recommend shower chair for home due to increased SOB with activity and safety with ADL. Pt planning to d/c home with intermittent supervision from family. No further acute OT needs identified; signing off at this time. Please re-consult if needs change. Thank you for this referral.    Follow Up Recommendations  No OT follow up;Supervision - Intermittent    Equipment Recommendations  Tub/shower seat    Recommendations for Other Services       Precautions / Restrictions Precautions Precautions: Fall Restrictions Weight Bearing Restrictions: No      Mobility Bed Mobility Overal bed mobility: Modified Independent                Transfers Overall transfer level: Modified independent                    Balance Overall balance assessment: Needs assistance Sitting-balance support: Feet supported;No upper extremity supported Sitting balance-Leahy Scale: Good     Standing balance support: No upper extremity supported;During functional activity Standing balance-Leahy Scale: Fair                              ADL Overall ADL's : Needs assistance/impaired Eating/Feeding: Independent;Sitting   Grooming: Supervision/safety;Standing   Upper Body Bathing: Supervision/ safety;Sitting   Lower Body Bathing: Supervison/  safety;Sit to/from stand   Upper Body Dressing : Supervision/safety;Sitting   Lower Body Dressing: Supervision/safety;Sit to/from stand   Toilet Transfer: Supervision/safety;Ambulation;Regular Toilet;RW   Toileting- Clothing Manipulation and Hygiene: Supervision/safety;Sit to/from stand   Tub/ Shower Transfer: Supervision/safety;Tub transfer;Ambulation;Rolling walker Tub/Shower Transfer Details (indicate cue type and reason): Simulated Functional mobility during ADLs: Supervision/safety;Rolling walker General ADL Comments: Educated pt on energy conservation strategies and provided handout.     Vision Vision Assessment?: No apparent visual deficits   Perception     Praxis      Pertinent Vitals/Pain Pain Assessment: Faces Faces Pain Scale: Hurts little more Pain Location: bil hips Pain Descriptors / Indicators: Sore Pain Intervention(s): Monitored during session;Repositioned     Hand Dominance     Extremity/Trunk Assessment Upper Extremity Assessment Upper Extremity Assessment: Overall WFL for tasks assessed   Lower Extremity Assessment Lower Extremity Assessment: Defer to PT evaluation   Cervical / Trunk Assessment Cervical / Trunk Assessment: Normal   Communication Communication Communication: No difficulties   Cognition Arousal/Alertness: Awake/alert Behavior During Therapy: WFL for tasks assessed/performed Overall Cognitive Status: Within Functional Limits for tasks assessed                     General Comments       Exercises       Shoulder Instructions      Home Living Family/patient expects to be discharged to:: Private residence Living Arrangements: Children;Other relatives Available Help at Discharge: Family;Available PRN/intermittently Type of Home: Apartment Home Access: Stairs to enter [employer] of  Steps: 15 Entrance Stairs-Rails: Right;Left;Can reach both Home Layout: One level     Bathroom Shower/Tub: Tub/shower  unit Shower/tub characteristics: Architectural technologist: Mullen     Home Equipment: Cane - single point          Prior Functioning/Environment Level of Independence: Independent with assistive device(s)        Comments: Cane for mobility. Does not drive, daughter gets groceries. Pt occasionally cooks and cleans but daughter helps out with household chores too. Independent with BADL.        OT Problem List:     OT Treatment/Interventions:      OT Goals(Current goals can be found in the care plan section) Acute Rehab OT Goals Patient Stated Goal: return home OT Goal Formulation: All assessment and education complete, DC therapy  OT Frequency:     Barriers to D/C:            Co-evaluation              End of Session Equipment Utilized During Treatment: Rolling walker Nurse Communication: Mobility status  Activity Tolerance: Patient tolerated treatment well Patient left: in chair;with call bell/phone within reach   Time: 1059-1111 OT Time Calculation (min): 12 min Charges:  OT General Charges $OT Visit: 1 Procedure OT Evaluation $OT Eval Low Complexity: 1 Procedure G-Codes:     Binnie Kand M.S., OTR/L Pager: [phone]  08/25/2016, 11:15 AM

## 2016-08-26 LAB — BASIC METABOLIC PANEL
Anion gap: 8 (ref 5–15)
BUN: 18 mg/dL (ref 6–20)
CO2: 29 mmol/L (ref 22–32)
Calcium: 8.5 mg/dL — ABNORMAL LOW (ref 8.9–10.3)
Chloride: 103 mmol/L (ref 101–111)
Creatinine, Ser: 1.45 mg/dL — ABNORMAL HIGH (ref 0.61–1.24)
GFR calc Af Amer: 58 mL/min — ABNORMAL LOW (ref >60)
GFR calc non Af Amer: 50 mL/min — ABNORMAL LOW (ref >60)
Glucose, Bld: 88 mg/dL (ref 65–99)
Potassium: 4.4 mmol/L (ref 3.5–5.1)
Sodium: 140 mmol/L (ref 135–145)

## 2016-08-26 LAB — HIV ANTIBODY (ROUTINE TESTING W REFLEX): HIV Screen 4th Generation wRfx: NONREACTIVE

## 2016-08-26 LAB — MAGNESIUM: Magnesium: 1.9 mg/dL (ref 1.7–2.4)

## 2016-08-26 LAB — GLUCOSE, CAPILLARY: Glucose-Capillary: 87 mg/dL (ref 65–99)

## 2016-08-26 MED ORDER — SPIRONOLACTONE 25 MG PO TABS
25.0000 mg | ORAL_TABLET | Freq: Every day | ORAL | Status: DC
  Administered 2016-08-27 – 2016-08-28 (×2): 25 mg via ORAL
  Filled 2016-08-26 (×2): qty 1

## 2016-08-26 MED ORDER — ASPIRIN 81 MG PO CHEW
81.0000 mg | CHEWABLE_TABLET | ORAL | Status: AC
  Administered 2016-08-27: 81 mg via ORAL
  Filled 2016-08-26: qty 1

## 2016-08-26 MED ORDER — MAGNESIUM SULFATE 2 GM/50ML IV SOLN
2.0000 g | Freq: Once | INTRAVENOUS | Status: AC
  Administered 2016-08-26: 2 g via INTRAVENOUS
  Filled 2016-08-26: qty 50

## 2016-08-26 MED ORDER — CARVEDILOL 3.125 MG PO TABS
3.1250 mg | ORAL_TABLET | Freq: Two times a day (BID) | ORAL | Status: DC
  Administered 2016-08-26 – 2016-08-28 (×5): 3.125 mg via ORAL
  Filled 2016-08-26 (×5): qty 1

## 2016-08-26 MED ORDER — LOSARTAN POTASSIUM 50 MG PO TABS
25.0000 mg | ORAL_TABLET | Freq: Every day | ORAL | Status: DC
  Administered 2016-08-27 – 2016-08-28 (×2): 25 mg via ORAL
  Filled 2016-08-26 (×2): qty 1

## 2016-08-26 NOTE — Care Management Note (Signed)
Case Management Note  Patient Details  Name: Edward Mullen MRN: [id number] Date of Birth: 1955/11/18  Subjective/Objective:                 Patient admitted form IM clinic, CHF. Plan is for L/RCardiac Cath tomorrow (1/16), continues IV Lasix diuresing, EF15-20% with severe MR.   Action/Plan:  CM will continue to follow for DC planning needs as they evolve after cath, pt noted to be uninsured.   Expected Discharge Date:  08/26/16               Expected Discharge Plan:     In-House Referral:     Discharge planning Services  CM Consult  Post Acute Care Choice:    Choice offered to:     DME Arranged:    DME Agency:     HH Arranged:    HH Agency:     Status of Service:  In process, will continue to follow  If discussed at Long Length of Stay Meetings, dates discussed:    Additional Comments:  Edward Collet, RN 08/26/2016, 3:14 PM

## 2016-08-26 NOTE — Progress Notes (Signed)
Advanced Heart Failure Rounding Note  Primary Physician: Dr. Marlowe Sax Referring: Dr. [name] Primary Cardiologist: None Reason for Consultation: CHF  Subjective:    Feeling much better. Remains slightly orthopneic. Walking halls without SOB.   Out 1.3 L though weight shows down 7 more lbs on lasix 40 mg IV BID. I/Os have been inaccurate.   Objective:   Weight Range: 201 lb 12.8 oz (91.5 kg) Body mass index is 27.37 kg/m.   Vital Signs:   Temp:  [96.9 F (36.1 C)-97.6 F (36.4 C)] 96.9 F (36.1 C) (01/15 0618) Pulse Rate:  [78-84] 78 (01/15 0618) Resp:  [17-18] 17 (01/15 0618) BP: (122-127)/(86-97) 125/92 (01/15 0618) SpO2:  [96 %-100 %] 99 % (01/15 0618) Weight:  [201 lb 12.8 oz (91.5 kg)] 201 lb 12.8 oz (91.5 kg) (01/15 0359) Last BM Date: 08/26/16  Weight change: Filed Weights   08/24/16 0607 08/25/16 0243 08/26/16 0359  Weight: 211 lb 3.2 oz (95.8 kg) 208 lb 1.8 oz (94.4 kg) 201 lb 12.8 oz (91.5 kg)    Intake/Output:   Intake/Output Summary (Last 24 hours) at 08/26/16 1033 Last data filed at 08/26/16 1026  Gross per 24 hour  Intake             1023 ml  Output             3925 ml  Net            -2902 ml     Physical Exam: General:  Elderly appearing. NAD.  HEENT: normal Neck: supple. JVP 10-11 cm cm. Carotids 2+ bilat; no bruits. No lymphadenopathy or thyromegaly appreciated. Cor: PMI nondisplaced. RRR. 3/6 HSM apex.  Lungs: CTAB, normal effort Abdomen: soft, NT, ND, no HSM. No bruits or masses. +BS  Extremities: no cyanosis, clubbing, rash. Trace to 1 + ankle edema.  Neuro: alert & orientedx3, cranial nerves grossly intact. moves all 4 extremities w/o difficulty. Affect pleasant  Telemetry: Reviewed, NSR  Labs: CBC  Recent Labs  08/24/16 0251  WBC 7.7  HGB 12.0*  HCT 36.5*  MCV 80.8  PLT [id number]   Basic Metabolic Panel  Recent Labs  08/25/16 0631 08/26/16 0421  NA 139 140  K 4.1 4.4  CL 104 103  CO2 27 29  GLUCOSE 87 88  BUN 18 18   CREATININE 1.38* 1.45*  CALCIUM 8.3* 8.5*  MG 1.8 1.9   Liver Function Tests  Recent Labs  08/23/16 1914 08/24/16 0251  AST 39 36  ALT 34 32  ALKPHOS 82 78  BILITOT 0.9 0.9  PROT 5.3* 5.4*  ALBUMIN 2.5* 2.5*   No results for input(s): LIPASE, AMYLASE in the last 72 hours. Cardiac Enzymes  Recent Labs  08/23/16 1914 08/24/16 0251 08/24/16 0700  TROPONINI 0.05* 0.05* 0.04*    BNP: BNP (last 3 results)  Recent Labs  08/05/16 1640 08/23/16 1520  BNP 1,150.3* 2,218.9*    ProBNP (last 3 results) No results for input(s): PROBNP in the last 8760 hours.   D-Dimer No results for input(s): DDIMER in the last 72 hours. Hemoglobin A1C  Recent Labs  08/23/16 1520  HGBA1C 5.6   Fasting Lipid Panel  Recent Labs  08/25/16 0631  CHOL 115  HDL 28*  LDLCALC 75  TRIG 62  CHOLHDL 4.1   Thyroid Function Tests  Recent Labs  08/24/16 1047  TSH 0.791    Other results:     Imaging/Studies:   No results found.    Medications:  Scheduled Medications: . enoxaparin (LOVENOX) injection  40 mg Subcutaneous Q24H  . feeding supplement (ENSURE ENLIVE)  237 mL Oral BID BM  . furosemide  40 mg Intravenous BID  . lisinopril  5 mg Oral Daily  . multivitamin with minerals  1 tablet Oral Daily  . pneumococcal 23 valent vaccine  0.5 mL Intramuscular Tomorrow-1000  . potassium chloride  20 mEq Oral BID  . senna-docusate  2 tablet Oral QHS  . sodium chloride flush  3 mL Intravenous Q12H  . spironolactone  12.5 mg Oral Daily     Infusions:   PRN Medications:  acetaminophen **OR** acetaminophen, polyethylene glycol   Assessment/Plan   61 yo with history of HTN, DM and polysubstance abuse as well as medical noncompliance was admitted with acute systolic CHF.   1. Acute on chronic systolic CHF: EF 0000000 with moderate to severe MR on echo.  Cause of cardiomyopathy is uncertain. HTN vs cocaine use vs viral vs ETOH.  Pt also has risk factros for CAD.     - Volume status remains elevated on exam. Continue Lasix 40 mg IV BID as weight appears to be coming down.  I/Os have been inaccurate.  - Transition lisinopril 5 mg daily to losartan 25 mg daily starting tomorrow. Could consider Entresto eventually.  - Continue spironolactone 12.5 mg daily.  - Will start low dose Coreg at 3.125 mg BID. Probably not a high risk of interaction with cocaine (has alpha blocking affect in addition to beta blockade), but have warned about the possibility for interaction.  - Needs to stop cocaine and ETOH (though says he is not a heavy drinker at this point).  - HIV and SPEP pending. - He will need ischemic workup once more fully diuresed.  2. AKI:  - Improved with diuresis and stable. - Continue to follow closely. 3. HTN:  - Was off all meds PTA. Has been relatively stable.  - Meds as above.  4. Polysubstance abuse:  - UDS positive on admission.  - Needs to stop cocaine, ETOH, and smoking and have counseled on same.   Continue IV lasix 40 mg BID with good results so far. Volume improved. Consider cath tomorrow. Will discuss timing with MD.   Length of Stay: 3  Shirley Friar, PA-C  08/26/2016, 10:33 AM  Advanced Heart Failure Team Pager [phone] (M-F; 7a - 4p)  Please contact [facility] for night-coverage after hours (4p -7a ) and weekends on [email or web address]  Patient seen with PA, agree with the above note.  Weight coming down with diuresis, breathing improved.  As above, can add Coreg today and transition to losartan.  Still volume overloaded, continue IV diuresis.  R/LHC tomorrow morning.  Risks/benefits discussed with patient and he agrees to proceed.   Loralie Champagne 08/26/2016 1:35 PM

## 2016-08-26 NOTE — Progress Notes (Addendum)
   Subjective: Feeling well today. Volume overload improving, trace pitting edema and clear lung sounds. Feels shortness of breath is improving.    Objective:  Vital signs in last 24 hours: Vitals:   08/25/16 1532 08/25/16 2237 08/26/16 0359 08/26/16 0618  BP: 125/86 (!) 122/95  (!) 125/92  Pulse: 81 84  78  Resp: 18 17  17   Temp: 97.3 F (36.3 C) 97.6 F (36.4 C)  (!) 96.9 F (36.1 C)  TempSrc: Oral Oral  Axillary  SpO2: 96% 100%  99%  Weight:   201 lb 12.8 oz (91.5 kg)   Height:       General: resting in bed, no distress, breathing comfortably on ambient air HEENT: EOMI, no scleral icterus, NCAT Neck: JVD present Cardiac: RRR, positive holosystolic murmur at apex Ext: warm and well perfused, 2+ pitting edema bilaterally Neuro: alert and oriented X3, cranial nerves II-XII grossly intact   Assessment/Plan:  Principal Problem:   CHF (congestive heart failure) (HCC) Active Problems:   HTN (hypertension)   Tobacco abuse   Borderline diabetes   AKI (acute kidney injury) (HCC)   CHF exacerbation (HCC)   Polysubstance abuse   Acute on chronic systolic CHF (congestive heart failure) (HCC)   CHF exacerbation (HCC) Volume overload DOE, orthopnea Hypokalemia History consistent with heart failure in gentleman with risk factors for developing reduced EF cardiomyopathy (EtOH and Cocaine use) and at risk for ischemic etiology with history of untreated HTN and tobacco use. ECHO revealed EF 15-20% diffuse hypokinesia, moderate dilatation and moderate hypertrophy, moderate -severe mitral regurgitation. A1c is 5.6, lipid panel showed low HDL but otherwise normal lipids.  Weight is down 7 kg since admission.  Net negative 3 liters. -cardiology is following, we appreciate their recommendations  -started lisinopril 5 mg daily and spironolactone 12.5 mg daily  -continue IV lasix 40 BID  - K Dur 27mEq BID.  Daily BMET and magnesium - telemetry - daily weights, strict I/O's -follow up  HIV, HCV, and SPEP   Polysubstance abuse  - UDS cocaine positive - CSW consult    HTN (hypertension) Hypertensive on presentation. Has not been on any medications for this.  Remains normotensive with volume removal.     AKI (acute kidney injury) ([place]) Crt 1.4 up from baseline 1.1-1.2, improving from 1.6 at admision. His crt was also elevated at last ED presentation for volume overload. May be cardiorenal secondary to decreased perfusion in the setting of volume overload.  Improved to 1.5 with diuresis -avoid nephrotoxic agents   DVT Ppx Lovenox   Code Status FULL   Dispo: Anticipated discharge in approximately 2-3 day(s).   Ledell Noss, MD 08/26/2016, 10:22 AM Pager: [phone]

## 2016-08-26 NOTE — Progress Notes (Signed)
Physical Therapy Treatment Patient Details Name: Edward Mullen MRN: [id number] DOB: 11-Oct-1955 Today's Date: 08/26/2016    History of Present Illness 61 yo male wtih onset of severe LE edema and pain across L hip was admitted with CHF, has elevated but declining troponins, PMHx:  DM, HTN, smoker, CHF, polysubstance abuse    PT Comments    Pt able to ascend/ descend stairs but has increased pain in B LE's throughout. Pt expressed positive outcome from added LE stretches for c/o muscle tightness. Pt would benefit from adding RW during gait training next session to help with correcting antalgic/ painful steppage and for energy conservation. Plan to continue stair training with emphasis on proper mechanics of climbing with railing and rest breaks with feeling SOB.    Follow Up Recommendations  No PT follow up     Equipment Recommendations  Rolling walker with 5" wheels    Recommendations for Other Services       Precautions / Restrictions Precautions Precautions: Fall Restrictions Weight Bearing Restrictions: No    Mobility  Bed Mobility Overal bed mobility: Modified Independent;Independent                Transfers Overall transfer level: Modified independent                  Ambulation/Gait Ambulation/Gait assistance: Min guard;Supervision Ambulation Distance (Feet): 180 Feet   Gait Pattern/deviations: Trunk flexed;Antalgic;Wide base of support Gait velocity: reduced; limping due to pain in B legs   General Gait Details: patient min guard at beginning of gait for safety; reduced to supervision toward end of gait with no signs of LOB.    Stairs Stairs: Yes   Stair Management: One rail Right Number of Stairs: 16 General stair comments: patient required rest breaks due to pain and fatigue. Pt education on importance of resting and not to use "crawling" method to ascend stairs.   Wheelchair Mobility    Modified Rankin (Stroke Patients Only)        Balance Overall balance assessment: Needs assistance Sitting-balance support: Feet supported;No upper extremity supported Sitting balance-Leahy Scale: Good     Standing balance support: No upper extremity supported;During functional activity Standing balance-Leahy Scale: Fair Standing balance comment: Pt would benefit from RW during dynamic standing balance to decrease pain in B LE's                    Cognition Arousal/Alertness: Awake/alert Behavior During Therapy: WFL for tasks assessed/performed Overall Cognitive Status: Within Functional Limits for tasks assessed                      Exercises Other Exercises Other Exercises: Seated bil Hamstring stretch 20" x 3 each with pt's belt.  Other Exercises: Standing bil calf stretch at wall 20" x 3 each.     General Comments        Pertinent Vitals/Pain Pain Assessment: 0-10 Pain Score: 7  Pain Location: B hamstrings Pain Descriptors / Indicators: Sore;Burning Pain Intervention(s): Monitored during session;Other (comment) (showed patient hamstring and calf stretches.)    Home Living                      Prior Function            PT Goals (current goals can now be found in the care plan section) Acute Rehab PT Goals Patient Stated Goal: return home PT Goal Formulation: With patient Potential to Achieve Goals: Good Progress  towards PT goals: Progressing toward goals    Frequency    Min 3X/week      PT Plan Current plan remains appropriate    Co-evaluation             End of Session Equipment Utilized During Treatment: Gait belt Activity Tolerance: Patient limited by pain;Patient limited by fatigue;Patient tolerated treatment well Patient left: in bed;with call bell/phone within reach     Time: 0924-0940 PT Time Calculation (min) (ACUTE ONLY): 16 min  Charges:  $Gait Training: 8-22 mins                    G Codes:      [facility] 23-Sep-2016, 9:58 AM  Olena Leatherwood, [place] Pager [phone]

## 2016-08-26 NOTE — Progress Notes (Signed)
Central monitor called about patient having 7 beats of V-tach and shortly after this episode, another 9-beats of V-tach. Patient is asymptomatic, at this time he is eating his breakfast and he denied any discomfort. MD informed. Will continue to monitor.

## 2016-08-26 NOTE — Progress Notes (Signed)
RN continue to receive alerts from central tele about patient having beats of V-tach. Patient is alert and oriented, denies any discomfort. PT worked with him with no complaints. MD notified. Will continue to monitor.

## 2016-08-26 NOTE — Progress Notes (Signed)
Multiple runs of V-tach during this shift. Patient asymptomatic, no complaints, no distress noted. MD aware. Will continue to monitor.

## 2016-08-27 ENCOUNTER — Encounter (HOSPITAL_COMMUNITY): Payer: Self-pay | Admitting: Cardiology

## 2016-08-27 ENCOUNTER — Encounter (HOSPITAL_COMMUNITY)
Admission: AD | Disposition: A | Discharge status: Home / Self Care | Payer: Self-pay | Source: Ambulatory Visit | Attending: Student in an Organized Health Care Education/Training Program

## 2016-08-27 DIAGNOSIS — I428 Other cardiomyopathies: Secondary | ICD-10-CM

## 2016-08-27 DIAGNOSIS — I2510 Atherosclerotic heart disease of native coronary artery without angina pectoris: Secondary | ICD-10-CM

## 2016-08-27 DIAGNOSIS — I340 Nonrheumatic mitral (valve) insufficiency: Secondary | ICD-10-CM

## 2016-08-27 DIAGNOSIS — I422 Other hypertrophic cardiomyopathy: Secondary | ICD-10-CM

## 2016-08-27 DIAGNOSIS — I5021 Acute systolic (congestive) heart failure: Secondary | ICD-10-CM

## 2016-08-27 HISTORY — PX: CARDIAC CATHETERIZATION: SHX172

## 2016-08-27 LAB — POCT I-STAT 3, VENOUS BLOOD GAS (G3P V)
Acid-Base Excess: 3 mmol/L — ABNORMAL HIGH (ref 0.0–2.0)
Acid-Base Excess: 4 mmol/L — ABNORMAL HIGH (ref 0.0–2.0)
Bicarbonate: 28.7 mmol/L — ABNORMAL HIGH (ref 20.0–28.0)
Bicarbonate: 29.6 mmol/L — ABNORMAL HIGH (ref 20.0–28.0)
O2 Saturation: 59 %
O2 Saturation: 61 %
TCO2: 30 mmol/L (ref 0–100)
TCO2: 31 mmol/L (ref 0–100)
pCO2, Ven: 45.1 mmHg (ref 44.0–60.0)
pCO2, Ven: 46.8 mmHg (ref 44.0–60.0)
pH, Ven: 7.409 (ref 7.250–7.430)
pH, Ven: 7.412 (ref 7.250–7.430)
pO2, Ven: 31 mmHg — CL (ref 32.0–45.0)
pO2, Ven: 32 mmHg (ref 32.0–45.0)

## 2016-08-27 LAB — PROTEIN ELECTROPHORESIS, SERUM
A/G Ratio: 1 (ref 0.7–1.7)
Albumin ELP: 2.6 g/dL — ABNORMAL LOW (ref 2.9–4.4)
Alpha-1-Globulin: 0.3 g/dL (ref 0.0–0.4)
Alpha-2-Globulin: 0.6 g/dL (ref 0.4–1.0)
Beta Globulin: 1.1 g/dL (ref 0.7–1.3)
Gamma Globulin: 0.7 g/dL (ref 0.4–1.8)
Globulin, Total: 2.7 g/dL (ref 2.2–3.9)
PDF: 0
Total Protein ELP: 5.3 g/dL — ABNORMAL LOW (ref 6.0–8.5)

## 2016-08-27 LAB — CBC
HCT: 40 % (ref 39.0–52.0)
Hemoglobin: 12.8 g/dL — ABNORMAL LOW (ref 13.0–17.0)
MCH: 25.9 pg — ABNORMAL LOW (ref 26.0–34.0)
MCHC: 32 g/dL (ref 30.0–36.0)
MCV: 81 fL (ref 78.0–100.0)
Platelets: 176 10*3/uL (ref 150–400)
RBC: 4.94 MIL/uL (ref 4.22–5.81)
RDW: 15 % (ref 11.5–15.5)
WBC: 7.5 10*3/uL (ref 4.0–10.5)

## 2016-08-27 LAB — BASIC METABOLIC PANEL
Anion gap: 8 (ref 5–15)
BUN: 18 mg/dL (ref 6–20)
CO2: 29 mmol/L (ref 22–32)
Calcium: 8.5 mg/dL — ABNORMAL LOW (ref 8.9–10.3)
Chloride: 101 mmol/L (ref 101–111)
Creatinine, Ser: 1.49 mg/dL — ABNORMAL HIGH (ref 0.61–1.24)
GFR calc Af Amer: 56 mL/min — ABNORMAL LOW (ref >60)
GFR calc non Af Amer: 49 mL/min — ABNORMAL LOW (ref >60)
Glucose, Bld: 101 mg/dL — ABNORMAL HIGH (ref 65–99)
Potassium: 4.4 mmol/L (ref 3.5–5.1)
Sodium: 138 mmol/L (ref 135–145)

## 2016-08-27 LAB — GLUCOSE, CAPILLARY: Glucose-Capillary: 93 mg/dL (ref 65–99)

## 2016-08-27 LAB — PROTIME-INR
INR: 1.11
Prothrombin Time: 14.3 seconds (ref 11.4–15.2)

## 2016-08-27 SURGERY — RIGHT/LEFT HEART CATH AND CORONARY ANGIOGRAPHY

## 2016-08-27 MED ORDER — ASPIRIN 81 MG PO CHEW
81.0000 mg | CHEWABLE_TABLET | Freq: Every day | ORAL | Status: DC
  Administered 2016-08-28: 81 mg via ORAL
  Filled 2016-08-27: qty 1

## 2016-08-27 MED ORDER — SPIRONOLACTONE 25 MG PO TABS: 25.0000 mg | ORAL_TABLET | Freq: Every day | ORAL | 0 refills | Status: DC

## 2016-08-27 MED ORDER — SODIUM CHLORIDE 0.9% FLUSH: 3.0000 mL | INTRAVENOUS | Status: DC | PRN

## 2016-08-27 MED ORDER — SODIUM CHLORIDE 0.9% FLUSH: 3.0000 mL | Freq: Two times a day (BID) | INTRAVENOUS | Status: DC

## 2016-08-27 MED ORDER — FUROSEMIDE 40 MG PO TABS: 40.0000 mg | ORAL_TABLET | Freq: Every day | ORAL | 0 refills | Status: DC

## 2016-08-27 MED ORDER — HEPARIN SODIUM (PORCINE) 1000 UNIT/ML IJ SOLN
INTRAMUSCULAR | Status: DC | PRN
  Administered 2016-08-27: 4500 [IU] via INTRAVENOUS

## 2016-08-27 MED ORDER — LIDOCAINE HCL (PF) 1 % IJ SOLN
INTRAMUSCULAR | Status: AC
  Filled 2016-08-27: qty 30

## 2016-08-27 MED ORDER — SODIUM CHLORIDE 0.9 % IV SOLN: INTRAVENOUS | Status: DC

## 2016-08-27 MED ORDER — HEPARIN (PORCINE) IN NACL 2-0.9 UNIT/ML-% IJ SOLN
INTRAMUSCULAR | Status: DC | PRN
  Administered 2016-08-27: 1000 mL

## 2016-08-27 MED ORDER — ASPIRIN 81 MG PO CHEW: 81.0000 mg | CHEWABLE_TABLET | Freq: Every day | ORAL | 0 refills | Status: DC

## 2016-08-27 MED ORDER — DIGOXIN 125 MCG PO TABS: 0.1250 mg | ORAL_TABLET | Freq: Every day | ORAL | 0 refills | Status: DC

## 2016-08-27 MED ORDER — FENTANYL CITRATE (PF) 100 MCG/2ML IJ SOLN
INTRAMUSCULAR | Status: DC | PRN
  Administered 2016-08-27 (×2): 25 ug via INTRAVENOUS

## 2016-08-27 MED ORDER — FUROSEMIDE 40 MG PO TABS
40.0000 mg | ORAL_TABLET | Freq: Every day | ORAL | Status: DC
  Administered 2016-08-27 – 2016-08-28 (×2): 40 mg via ORAL
  Filled 2016-08-27 (×2): qty 1

## 2016-08-27 MED ORDER — FENTANYL CITRATE (PF) 100 MCG/2ML IJ SOLN
INTRAMUSCULAR | Status: AC
  Filled 2016-08-27: qty 2

## 2016-08-27 MED ORDER — NITROGLYCERIN 1 MG/10 ML FOR IR/CATH LAB
INTRA_ARTERIAL | Status: AC
  Filled 2016-08-27: qty 10

## 2016-08-27 MED ORDER — LIDOCAINE HCL (PF) 1 % IJ SOLN
INTRAMUSCULAR | Status: DC | PRN
  Administered 2016-08-27 (×2): 2 mL

## 2016-08-27 MED ORDER — DIGOXIN 125 MCG PO TABS
0.1250 mg | ORAL_TABLET | Freq: Every day | ORAL | Status: DC
  Administered 2016-08-27 – 2016-08-28 (×2): 0.125 mg via ORAL
  Filled 2016-08-27 (×2): qty 1

## 2016-08-27 MED ORDER — SODIUM CHLORIDE 0.9 % IV SOLN: 250.0000 mL | INTRAVENOUS | Status: DC | PRN

## 2016-08-27 MED ORDER — ATORVASTATIN CALCIUM 20 MG PO TABS
20.0000 mg | ORAL_TABLET | Freq: Every day | ORAL | Status: DC
  Administered 2016-08-27: 18:00:00 20 mg via ORAL
  Filled 2016-08-27: qty 1

## 2016-08-27 MED ORDER — MIDAZOLAM HCL 2 MG/2ML IJ SOLN
INTRAMUSCULAR | Status: AC
  Filled 2016-08-27: qty 2

## 2016-08-27 MED ORDER — HEPARIN (PORCINE) IN NACL 2-0.9 UNIT/ML-% IJ SOLN
INTRAMUSCULAR | Status: AC
  Filled 2016-08-27: qty 1000

## 2016-08-27 MED ORDER — POTASSIUM CHLORIDE CRYS ER 20 MEQ PO TBCR: 20.0000 meq | EXTENDED_RELEASE_TABLET | Freq: Two times a day (BID) | ORAL | 0 refills | Status: DC

## 2016-08-27 MED ORDER — MIDAZOLAM HCL 2 MG/2ML IJ SOLN
INTRAMUSCULAR | Status: DC | PRN
  Administered 2016-08-27 (×2): 1 mg via INTRAVENOUS

## 2016-08-27 MED ORDER — CARVEDILOL 3.125 MG PO TABS: 3.1250 mg | ORAL_TABLET | Freq: Two times a day (BID) | ORAL | 0 refills | Status: DC

## 2016-08-27 MED ORDER — ATORVASTATIN CALCIUM 20 MG PO TABS: 20.0000 mg | ORAL_TABLET | Freq: Every day | ORAL | 0 refills | Status: DC

## 2016-08-27 MED ORDER — VERAPAMIL HCL 2.5 MG/ML IV SOLN
INTRAVENOUS | Status: DC | PRN
  Administered 2016-08-27: 10 mL via INTRA_ARTERIAL

## 2016-08-27 MED ORDER — IOPAMIDOL (ISOVUE-370) INJECTION 76%
INTRAVENOUS | Status: DC | PRN
  Administered 2016-08-27: 60 mL via INTRA_ARTERIAL

## 2016-08-27 MED ORDER — ACETAMINOPHEN 325 MG PO TABS: 650.0000 mg | ORAL_TABLET | ORAL | Status: DC | PRN

## 2016-08-27 MED ORDER — HEPARIN SODIUM (PORCINE) 5000 UNIT/ML IJ SOLN
5000.0000 [IU] | Freq: Three times a day (TID) | INTRAMUSCULAR | Status: DC
  Administered 2016-08-27 – 2016-08-28 (×2): 5000 [IU] via SUBCUTANEOUS
  Filled 2016-08-27 (×2): qty 1

## 2016-08-27 MED ORDER — SODIUM CHLORIDE 0.9 % IV SOLN
INTRAVENOUS | Status: DC
  Administered 2016-08-27: 06:00:00 via INTRAVENOUS

## 2016-08-27 MED ORDER — IOPAMIDOL (ISOVUE-370) INJECTION 76%
INTRAVENOUS | Status: AC
  Filled 2016-08-27: qty 100

## 2016-08-27 MED ORDER — ONDANSETRON HCL 4 MG/2ML IJ SOLN: 4.0000 mg | Freq: Four times a day (QID) | INTRAMUSCULAR | Status: DC | PRN

## 2016-08-27 MED ORDER — HEPARIN SODIUM (PORCINE) 1000 UNIT/ML IJ SOLN
INTRAMUSCULAR | Status: AC
  Filled 2016-08-27: qty 1

## 2016-08-27 MED ORDER — LOSARTAN POTASSIUM 25 MG PO TABS: 25.0000 mg | ORAL_TABLET | Freq: Every day | ORAL | 0 refills | Status: DC

## 2016-08-27 MED ORDER — VERAPAMIL HCL 2.5 MG/ML IV SOLN
INTRAVENOUS | Status: AC
  Filled 2016-08-27: qty 2

## 2016-08-27 SURGICAL SUPPLY — 13 items
CATH BALLN WEDGE 5F 110CM (CATHETERS) ×2 IMPLANT
CATH IMPULSE 5F ANG/FL3.5 (CATHETERS) ×2 IMPLANT
DEVICE RAD COMP TR BAND LRG (VASCULAR PRODUCTS) ×2 IMPLANT
GLIDESHEATH SLEND SS 6F .021 (SHEATH) ×2 IMPLANT
GUIDEWIRE INQWIRE 1.5J.035X260 (WIRE) ×1 IMPLANT
INQWIRE 1.5J .035X260CM (WIRE) ×2
KIT HEART LEFT (KITS) ×2 IMPLANT
PACK CARDIAC CATHETERIZATION (CUSTOM PROCEDURE TRAY) ×2 IMPLANT
SHEATH FAST CATH BRACH 5F 5CM (SHEATH) ×2 IMPLANT
TRANSDUCER W/STOPCOCK (MISCELLANEOUS) ×2 IMPLANT
TUBING CIL FLEX 10 FLL-RA (TUBING) ×2 IMPLANT
WIRE ASAHI PROWATER 180CM (WIRE) ×2 IMPLANT
WIRE EMERALD 3MM-J .025X260CM (WIRE) ×2 IMPLANT

## 2016-08-27 NOTE — Progress Notes (Signed)
Internal Medicine Clinic Attending  Case discussed with Dr. Strelow at the time of the visit.  We reviewed the resident's history and exam and pertinent patient test results.  I agree with the assessment, diagnosis, and plan of care documented in the resident's note.  

## 2016-08-27 NOTE — Progress Notes (Signed)
Tech went to get lunch tray from dietary, Tech did set-up lunch tray for Pt to start eating.

## 2016-08-27 NOTE — Progress Notes (Addendum)
Patient going to Cath Lab. Alert and oriented x 4; no acute distress noted, no complaints. Writer still getting morning report when patient left, therefore unable to do his assessment before he left.

## 2016-08-27 NOTE — Progress Notes (Signed)
Heart Failure Navigator Consult Note  Presentation: Per Dr Christella Hartigan is a 61 yo with history of HTN, DM and polysubstance abuse as well as medical noncompliance was directly admitted from internal medicine clinic on 08/23/16 for management of CHF. Patient reports exertional dyspnea and lower leg swelling for several months now.  Symptoms have become progressively worse.  He has episodes of chest pain, these are atypical and not related to exertion.  He was seen at the ER at [facility] in 12/17 with volume overload/CHF.  Admission was recommended but he left AMA.  He was seen 08/23/16 at internal medicine clinic and was admitted due to ongoing volume overload/CHF and severe leg edema.  He says that he has been short of breath walking short distances around his house.  He was having orthopnea and PND episodes.  He has been on no meds at home.  He was admitted and started on IV Lasix.  It does not appear that I/Os are being measured but weight is coming down.  Echo was done showing EF 15-20% with moderate AI and moderate to severe MR.    Past Medical History:  Diagnosis Date  . Arthritis    "feels like it in my legs" (09/20/2014)  . Borderline type 2 diabetes mellitus   . Hypertension     Social History   Social History  . Marital status: Married    Spouse name: N/A  . Number of children: N/A  . 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 Smoking status: Current Every Day Smoker    Packs/day: 0.30    Years: 48.00    Types: Cigarettes  . Smokeless tobacco: Never Used     Comment: cutting back 2 per day  . Alcohol use 3.6 oz/week    6 Cans of beer per week  . Drug use:     Types: Marijuana, Cocaine  . Sexual activity: Yes    Birth control/ protection: None   Other Topics Concern  . None   Social History Narrative  . None    ECHO:Study  Conclusions--08/25/16 - Left ventricle: The cavity size was moderately dilated. There was   moderate concentric hypertrophy. Systolic function was severely   reduced. The estimated ejection fraction was in the range of   15-20%. Diffuse hypokinesis. Doppler parameters are consistent   with restrictive physiology, indicative of decreased left   ventricular diastolic compliance and/or increased left atrial   pressure. Doppler parameters are consistent with elevated   ventricular end-diastolic filling pressure. - Aortic valve: Trileaflet; normal thickness leaflets. There was   moderate regurgitation. - Ascending aorta: The ascending aorta was normal in size. - Mitral valve: There was moderate to severe regurgitation directed   posteriorly. - Left atrium: The atrium was moderately dilated. - Right ventricle: The cavity size was normal. Wall thickness was   normal. Systolic function was mildly reduced. - Right atrium: The atrium was moderately dilated. - Tricuspid valve: There was moderate regurgitation. - Pulmonic valve: There was no regurgitation. - Pulmonary arteries: Systolic pressure was moderately increased.   PA peak pressure: 44 mm Hg (S). - Inferior vena cava: The vessel was normal in size. - Pericardium, extracardiac: There was no pericardial effusion.  ------------------------------------------------------------------- Study data:  No prior study was available for comparison.  Study status:  Routine.  Procedure:  The patient reported no pain pre or post test. Transthoracic echocardiography. Image quality was adequate.  Study completion:  There were no complications. Transthoracic echocardiography.  M-mode, complete 2D, spectral Doppler, and color Doppler.  Birthdate:  Patient birthdate: April 03, 1956.  Age:  Patient is 61 yr old.  Sex:  Gender: male. BMI: 28.2 kg/m^2.  Blood pressure:     116/78  Patient status: Inpatient.  Study date:  Study date: 08/25/2016. Study time:  11:28 AM.  Location:  Bedside.   08/27/16--Cardiac Catheterization:  Conclusion   1. 80% stenosis proximally in PLV-2, otherwise mild nonobstructive disease.  This is a nonischemic cardiomyopathy, would not intervene.  2. Filling pressures mildly elevated, marginal cardiac output.   Transition to po Lasix, add digoxin and statin.     BNP    Component Value Date/Time   BNP 2,218.9 (H) 08/23/2016 1520    ProBNP No results found for: PROBNP   Education Assessment and Provision:  Detailed education and instructions provided on heart failure disease management including the following:  Signs and symptoms of Heart Failure When to call the physician Importance of daily weights Low sodium diet Fluid restriction Medication management Anticipated future follow-up appointments  Patient education given on each of the above topics.  Patient acknowledges understanding and acceptance of all instructions.  I spoke with Mr.  Tirabassi regarding his HF.  He tells me that he currently lives with his daughter and has no insurance.  He says he has not taken BP meds in years.  I have provided a scale for home and reviewed the importance of daily weights as as when to contact the physician.  I also reviewed a low sodium diet and high sodium foods to avoid.  He will follow in the [facility].  I sent the following medications per Dr. Aundra Dubin to the Outpatient pharmacy to be filled per HF [facility].   Losartan 25 mg Daily Lasix 40 mg Daily Spironolactone 25mg  Daily Coreg 3.125 BID Digoxin 0.125 Daily Potassium 20 meq Daily Atorvastatin  20mg  Daily   Education Materials:  "Living Better With Heart Failure" Booklet, Daily Weight Tracker Tool    High Risk Criteria for Readmission and/or Poor Patient Outcomes:   EF <30%- 15-20%   2 or more admissions in 6 months- 2 yes  Difficult social situation- yes- substance abuse and no insurance noted  Demonstrates medication noncompliance- yes was taking  no medications on admission.  Admits that he has not had BP meds in years. I     Barriers of Care:  Knowledge and compliance  Discharge Planning:   Plans to return to home with daughter.  He would benefit from [employer] and will refer when he comes for F/U Appt..  I have also referred him to the [facility] LCSW for polysubstance abuse and help with Medicaid /financial needs.

## 2016-08-27 NOTE — Progress Notes (Signed)
Discussed CHF diagnosis and treatment, meds, daily weight, when to call MD.  CHF packet given. Stressed importance of medication compliance and ability to avoid hospitalization since pt states he "never goes to the Dr."  Pt states he is "willing to do what it takes" to stay well for his grandchildren.  States he cut down from 1.5 packs cigarettes to 2 cigarettes/day.  Smoking cessation handout given.  Pt does not have scale, limited finances may prevent pt from buying one.  Daphne RN HF Navigator called to see about scale.  TR BAND REMOVAL  LOCATION:    right radial  DEFLATED PER PROTOCOL:    Yes.    TIME BAND OFF / DRESSING APPLIED:    1130   SITE UPON ARRIVAL:    Level 0  SITE AFTER BAND REMOVAL:    Level 0  CIRCULATION SENSATION AND MOVEMENT:    Within Normal Limits   Yes.    COMMENTS:   Radial cath sheet given, reminded pt not to use Rt arm x 24 hours.

## 2016-08-27 NOTE — Progress Notes (Signed)
   Subjective: Feeling well today. Denies new concerns or complaints. Volume overload improving, trace pitting edema and clear lung sounds. Did have some dyspnea when lying down flat for cath but feels shortness of breath has improved.    Objective:  Vital signs in last 24 hours: Vitals:   08/27/16 0900 08/27/16 0917 08/27/16 1100 08/27/16 1104  BP: 113/85 (!) 124/92 (!) 128/92   Pulse: 72 77 74 73  Resp: 11 15 20    Temp:  97.8 F (36.6 C)    TempSrc:  Oral    SpO2:  95% 97%   Weight:      Height:       General: resting in bed, no distress, breathing comfortably on ambient air HEENT: EOMI, no scleral icterus, NCAT Neck: JVD present Cardiac: RRR, positive holosystolic murmur at apex Ext: warm and well perfused, 2+ pitting edema bilaterally Neuro: alert and oriented X3, cranial nerves II-XII grossly intact   Assessment/Plan:  Principal Problem:   CHF (congestive heart failure) (HCC) Active Problems:   HTN (hypertension)   Tobacco abuse   Borderline diabetes   AKI (acute kidney injury) (HCC)   CHF exacerbation (HCC)   Polysubstance abuse   Acute systolic CHF (congestive heart failure) (HCC)   Non-ischemic cardiomyopathy (HCC)   Non-obstructive hypertrophic cardiomyopathy (HCC)   Mitral regurgitation   CHF exacerbation (HCC) Volume overload Non ischemic cardiomyopathy  Non obstructive hypertrophic cardiomyopathy  History consistent with heart failure in gentleman with risk factors for developing reduced EF cardiomyopathy (EtOH and Cocaine use) and at risk for ischemic etiology with history of untreated HTN and tobacco use. ECHO revealed EF 15-20% diffuse hypokinesia, moderate dilatation and moderate hypertrophy, moderate -severe mitral regurgitation. Cath today showed 80% occlusion of PLV and 30% RCA occlusion without other occlusions. A1c is 5.6, lipid panel showed low HDL but otherwise normal lipids. HIV negative  Weight is down 10 kg since admission.  Net negative 5.5  liters. -cardiology is following, they will follow up with him in heart failure clinic -continue aspirin 81 mg qd, atorvastatin 20 mg qd, carvedilol 3.125 mg BID, digoxin 0.125 mg qd, K-dur 20 meq qd, losartan 25 mg qd, spironolactone 12.5 mg qd, lasix 40 meq daily,  - daily weights, strict I/O's -follow up HCV, and SPEP   Polysubstance abuse  - UDS cocaine positive - CSW consult    HTN (hypertension) Hypertensive on presentation. Has not been on any medications for this.  Remains normotensive with volume removal.     AKI (acute kidney injury) ([place]) Crt 1.4 up from baseline 1.1-1.2, improving from 1.6 at admision. His crt was also elevated at last ED presentation for volume overload. May be cardiorenal secondary to decreased perfusion in the setting of volume overload.  Improved to 1.5 with diuresis -avoid nephrotoxic agents   DVT Ppx Lovenox   Code Status FULL   Dispo: Anticipated discharge in approximately 2-3 day(s).   Ledell Noss, MD 08/27/2016, 11:48 AM Pager: [phone]

## 2016-08-27 NOTE — Progress Notes (Signed)
Tx performed and charted by SPTA under direct guidance and supervision of PTA at all times.  Charting reviewed for accuracy and depicts tx performed and services provided.  Governor Rooks, PTA pager [phone]   Physical Therapy Treatment Patient Details Name: Edward Mullen MRN: [id number] DOB: 11-21-1955 Today's Date: 08/27/2016    History of Present Illness 61 yo male wtih onset of severe LE edema and pain across L hip was admitted with CHF, has elevated but declining troponins, PMHx:  DM, HTN, smoker, CHF, polysubstance abuse    PT Comments    Pt reports having less pain in legs compared to yesterday. Pt vitals were monitored during treatment due to earlier procedure. Heart monitor showed episode of irregular heart beat at HR increased to 110 bpm  With beats of vtach and chest tighness after climbing 16 stairs at faster than recommended pace. Pt education for energy conservation during stair climbing; encouragedd pacing and pursed lip breathing when symptoms occur. HR reduced to 87 bpm at rest. Pt expected to d/c to private residence with daughter tomorrow.    Follow Up Recommendations  No PT follow up     Equipment Recommendations  Rolling walker with 5" wheels    Recommendations for Other Services       Precautions / Restrictions Precautions Precautions: Fall Restrictions Weight Bearing Restrictions: Yes RUE Weight Bearing: Non weight bearing (R wrist)    Mobility  Bed Mobility Overal bed mobility: Modified Independent                Transfers Overall transfer level: Modified independent Equipment used: Rolling walker (2 wheeled)             General transfer comment: used L UE to assist with getting off EOB  Ambulation/Gait Ambulation/Gait assistance: Min guard Ambulation Distance (Feet): 100 Feet Assistive device: Rolling walker (2 wheeled) Gait Pattern/deviations: Trunk flexed;Step-through pattern   Gait velocity interpretation: at or above  normal speed for age/gender General Gait Details: v/c to not use R UE due to WB restrictions. Min guard for safety and monitoring vitals.    Stairs Stairs: Yes   Stair Management: One rail Left Number of Stairs: 16 General stair comments: Railing used on L due to NWB on right UE. Patient required monitoring and one rest break due to chest pain and SOB during stairs.  Wheelchair Mobility    Modified Rankin (Stroke Patients Only)       Balance Overall balance assessment: Needs assistance Sitting-balance support: No upper extremity supported Sitting balance-Leahy Scale: Good     Standing balance support: During functional activity;Bilateral upper extremity supported   Standing balance comment: RW used during dynamic standing with v/c to not put weight through R wrist.                    Cognition Arousal/Alertness: Awake/alert Behavior During Therapy: WFL for tasks assessed/performed Overall Cognitive Status: Within Functional Limits for tasks assessed                      Exercises      General Comments        Pertinent Vitals/Pain Pain Assessment: No/denies pain    Home Living                      Prior Function            PT Goals (current goals can now be found in the care plan section) Acute Rehab  PT Goals Patient Stated Goal: going home with daughter. PT Goal Formulation: With patient Potential to Achieve Goals: Good Progress towards PT goals: Progressing toward goals    Frequency    Min 3X/week      PT Plan Current plan remains appropriate    Co-evaluation             End of Session Equipment Utilized During Treatment: Gait belt Activity Tolerance: Patient tolerated treatment well Patient left: in bed;with call bell/phone within reach     Time: [id number] PT Time Calculation (min) (ACUTE ONLY): 15 min  Charges:  $Gait Training: 8-22 mins                    G Codes:      [facility] September 25, 2016,  5:37 PM  Olena Leatherwood, [place] Pager [phone]

## 2016-08-27 NOTE — H&P (View-Only) (Signed)
Advanced Heart Failure Rounding Note  Primary Physician: Dr. Marlowe Sax Referring: Dr. [name] Primary Cardiologist: None Reason for Consultation: CHF  Subjective:    Feeling much better. Remains slightly orthopneic. Walking halls without SOB.   Out 1.3 L though weight shows down 7 more lbs on lasix 40 mg IV BID. I/Os have been inaccurate.   Objective:   Weight Range: 201 lb 12.8 oz (91.5 kg) Body mass index is 27.37 kg/m.   Vital Signs:   Temp:  [96.9 F (36.1 C)-97.6 F (36.4 C)] 96.9 F (36.1 C) (01/15 0618) Pulse Rate:  [78-84] 78 (01/15 0618) Resp:  [17-18] 17 (01/15 0618) BP: (122-127)/(86-97) 125/92 (01/15 0618) SpO2:  [96 %-100 %] 99 % (01/15 0618) Weight:  [201 lb 12.8 oz (91.5 kg)] 201 lb 12.8 oz (91.5 kg) (01/15 0359) Last BM Date: 08/26/16  Weight change: Filed Weights   08/24/16 0607 08/25/16 0243 08/26/16 0359  Weight: 211 lb 3.2 oz (95.8 kg) 208 lb 1.8 oz (94.4 kg) 201 lb 12.8 oz (91.5 kg)    Intake/Output:   Intake/Output Summary (Last 24 hours) at 08/26/16 1033 Last data filed at 08/26/16 1026  Gross per 24 hour  Intake             1023 ml  Output             3925 ml  Net            -2902 ml     Physical Exam: General:  Elderly appearing. NAD.  HEENT: normal Neck: supple. JVP 10-11 cm cm. Carotids 2+ bilat; no bruits. No lymphadenopathy or thyromegaly appreciated. Cor: PMI nondisplaced. RRR. 3/6 HSM apex.  Lungs: CTAB, normal effort Abdomen: soft, NT, ND, no HSM. No bruits or masses. +BS  Extremities: no cyanosis, clubbing, rash. Trace to 1 + ankle edema.  Neuro: alert & orientedx3, cranial nerves grossly intact. moves all 4 extremities w/o difficulty. Affect pleasant  Telemetry: Reviewed, NSR  Labs: CBC  Recent Labs  08/24/16 0251  WBC 7.7  HGB 12.0*  HCT 36.5*  MCV 80.8  PLT [id number]   Basic Metabolic Panel  Recent Labs  08/25/16 0631 08/26/16 0421  NA 139 140  K 4.1 4.4  CL 104 103  CO2 27 29  GLUCOSE 87 88  BUN 18 18   CREATININE 1.38* 1.45*  CALCIUM 8.3* 8.5*  MG 1.8 1.9   Liver Function Tests  Recent Labs  08/23/16 1914 08/24/16 0251  AST 39 36  ALT 34 32  ALKPHOS 82 78  BILITOT 0.9 0.9  PROT 5.3* 5.4*  ALBUMIN 2.5* 2.5*   No results for input(s): LIPASE, AMYLASE in the last 72 hours. Cardiac Enzymes  Recent Labs  08/23/16 1914 08/24/16 0251 08/24/16 0700  TROPONINI 0.05* 0.05* 0.04*    BNP: BNP (last 3 results)  Recent Labs  08/05/16 1640 08/23/16 1520  BNP 1,150.3* 2,218.9*    ProBNP (last 3 results) No results for input(s): PROBNP in the last 8760 hours.   D-Dimer No results for input(s): DDIMER in the last 72 hours. Hemoglobin A1C  Recent Labs  08/23/16 1520  HGBA1C 5.6   Fasting Lipid Panel  Recent Labs  08/25/16 0631  CHOL 115  HDL 28*  LDLCALC 75  TRIG 62  CHOLHDL 4.1   Thyroid Function Tests  Recent Labs  08/24/16 1047  TSH 0.791    Other results:     Imaging/Studies:   No results found.    Medications:  Scheduled Medications: . enoxaparin (LOVENOX) injection  40 mg Subcutaneous Q24H  . feeding supplement (ENSURE ENLIVE)  237 mL Oral BID BM  . furosemide  40 mg Intravenous BID  . lisinopril  5 mg Oral Daily  . multivitamin with minerals  1 tablet Oral Daily  . pneumococcal 23 valent vaccine  0.5 mL Intramuscular Tomorrow-1000  . potassium chloride  20 mEq Oral BID  . senna-docusate  2 tablet Oral QHS  . sodium chloride flush  3 mL Intravenous Q12H  . spironolactone  12.5 mg Oral Daily     Infusions:   PRN Medications:  acetaminophen **OR** acetaminophen, polyethylene glycol   Assessment/Plan   61 yo with history of HTN, DM and polysubstance abuse as well as medical noncompliance was admitted with acute systolic CHF.   1. Acute on chronic systolic CHF: EF 0000000 with moderate to severe MR on echo.  Cause of cardiomyopathy is uncertain. HTN vs cocaine use vs viral vs ETOH.  Pt also has risk factros for CAD.     - Volume status remains elevated on exam. Continue Lasix 40 mg IV BID as weight appears to be coming down.  I/Os have been inaccurate.  - Transition lisinopril 5 mg daily to losartan 25 mg daily starting tomorrow. Could consider Entresto eventually.  - Continue spironolactone 12.5 mg daily.  - Will start low dose Coreg at 3.125 mg BID. Probably not a high risk of interaction with cocaine (has alpha blocking affect in addition to beta blockade), but have warned about the possibility for interaction.  - Needs to stop cocaine and ETOH (though says he is not a heavy drinker at this point).  - HIV and SPEP pending. - He will need ischemic workup once more fully diuresed.  2. AKI:  - Improved with diuresis and stable. - Continue to follow closely. 3. HTN:  - Was off all meds PTA. Has been relatively stable.  - Meds as above.  4. Polysubstance abuse:  - UDS positive on admission.  - Needs to stop cocaine, ETOH, and smoking and have counseled on same.   Continue IV lasix 40 mg BID with good results so far. Volume improved. Consider cath tomorrow. Will discuss timing with MD.   Length of Stay: 3  Shirley Friar, PA-C  08/26/2016, 10:33 AM  Advanced Heart Failure Team Pager [phone] (M-F; 7a - 4p)  Please contact [facility] for night-coverage after hours (4p -7a ) and weekends on [email or web address]  Patient seen with PA, agree with the above note.  Weight coming down with diuresis, breathing improved.  As above, can add Coreg today and transition to losartan.  Still volume overloaded, continue IV diuresis.  R/LHC tomorrow morning.  Risks/benefits discussed with patient and he agrees to proceed.   Loralie Champagne 08/26/2016 1:35 PM

## 2016-08-27 NOTE — Progress Notes (Signed)
Physical therapy in to work with patient.  Ambulating in stair well c/o of tightness of chest, eased when rested.  Notified Dr Aundra Dubin and Dr Hetty Ely regarding runs of VT as well as tightness in chest.  Both agreed to keep patient overnight to monitor.  Informed patient, he was ok with that.  Will continue to monitor

## 2016-08-27 NOTE — Discharge Summary (Signed)
Name: Edward Mullen MRN: [id number] DOB: Aug 25, 1955 61 y.o. PCP: Shela Leff, MD  Date of Admission: 08/23/2016  5:47 PM Date of Discharge: 08/27/2016 Attending Physician: Lucious Groves, DO  Discharge Diagnosis: 1.   Acute systolic CHF (congestive heart failure) (HCC)   Non-ischemic cardiomyopathy (HCC)   Non-obstructive hypertrophic cardiomyopathy ([place])   Discharge Medications: Allergies as of 08/28/2016   No Known Allergies     Medication List    TAKE these medications   aspirin 81 MG chewable tablet Chew 1 tablet (81 mg total) by mouth daily.   atorvastatin 20 MG tablet Commonly known as:  LIPITOR Take 1 tablet (20 mg total) by mouth daily at 6 PM.   carvedilol 3.125 MG tablet Commonly known as:  COREG Take 1 tablet (3.125 mg total) by mouth 2 (two) times daily with a meal.   digoxin 0.125 MG tablet Commonly known as:  LANOXIN Take 1 tablet (0.125 mg total) by mouth daily.   furosemide 40 MG tablet Commonly known as:  LASIX Take 1 tablet (40 mg total) by mouth daily.   losartan 25 MG tablet Commonly known as:  COZAAR Take 1 tablet (25 mg total) by mouth daily.   spironolactone 25 MG tablet Commonly known as:  ALDACTONE Take 1 tablet (25 mg total) by mouth daily.       Disposition and follow-up:   [name] was discharged from [facility] in Good condition.  At the hospital follow up visit please address:  1.  Newly diagnosed heart failure- Has he had any barriers to getting his medications?   2.  Labs / imaging needed at time of follow-up: BMP (crt 1.4 at discharge)   3.  Pending labs/ test needing follow-up: HCV, SPEP   Follow-up Appointments: Follow-up Information    Loralie Champagne, MD. Go on 09/06/2016.   Specialty:  Cardiology Why:  at 1000 for post hospital follow up. Please bring all of your medications to your visit. The code for parking is 5000. Contact information: [place] [address] [address]. Schedule an appointment as soon as possible for a visit in 1 week(s).   Contact information: [address] [place] [facility] Course by problem list:    Acute systolic CHF (congestive heart failure) (HCC)   Non-ischemic cardiomyopathy (HCC)   Non-obstructive hypertrophic cardiomyopathy (Edward Mullen)   Mitral regurgitation 61 year old man with PMH HTN, alcohol, tobacco and cocaine abuse who presented to the internal medicine clinic with painful leg swelling, dyspnea on exertion, and orthopnea. He was found to have JVD, holosystolic murmur, and 2+b/l lower extremity swelling, weight gain 6 kg since 04/2016 and BNP 2200. He was started on IV lasix 40 mg BID. ECHO revealed EF 15-20%, diffuse hypokinesia, moderate dilatation and hypertrophy and moderate-severe mitral regurgitation. Cardiac cath revealed 80% occlusion of PLV and 30% RCA occlusion. He was started on medication management with aspirin, atorvastatin, carvedilol, digoxin, K-Dur, losartan, spironolactone, and lasix. He sustained multiple runs of vtach during this admission but was not felt to be a good candidate for ICD. On the day of discharge his weight was 87kg. Medications were sent to the [facility] and he was scheduled for follow up in the heart failure clinic.     AKI (acute kidney injury) ([place]) Crt 1.6 at presentation  up from b/l 1.1-1.2. Was thought to be cardiorenal secondary to decreased perfusion in the setting of volume overload. Improved to 1.4 on the day of discharge.    Polysubstance abuse He was counseled on cessation    Discharge Vitals:   BP 131/84 (BP Location: Left Arm)   Pulse 77   Temp 97.8 F (36.6 C) (Oral)   Resp (!) 23   Ht 6' (1.829 m)   Wt 193 lb 4.8 oz (87.7 kg)   SpO2 100%   BMI 26.22 kg/m   Pertinent Labs, Studies, and Procedures: A1c 5.6  Procedures  Performed:  X-ray Chest Pa And Lateral  Result Date: 08/23/2016 CLINICAL DATA:  Lower extremity swelling today. Shortness of breath for 2 days. Hypertension. Smoker. History of CHF. Diabetes. EXAM: CHEST  2 VIEW COMPARISON:  08/05/2016 FINDINGS: Mild cardiac enlargement without vascular congestion. No focal consolidation or airspace disease in the lungs. No blunting of costophrenic angles. No pneumothorax. Tortuous aorta. Degenerative changes in the spine and shoulders. IMPRESSION: Mild cardiac enlargement.  No evidence of active pulmonary disease. Electronically Signed   By: Lucienne Capers M.D.   On: 08/23/2016 21:48   Dg Chest 2 View  Result Date: 08/05/2016 CLINICAL DATA:  Chest pain for 2 days EXAM: CHEST  2 VIEW COMPARISON:  None. FINDINGS: Cardiac shadow is mildly enlarged. The lungs are well aerated bilaterally. No focal infiltrate or sizable effusion is seen. No acute bony abnormality is noted. IMPRESSION: No active cardiopulmonary disease. Electronically Signed   By: Inez Catalina M.D.   On: 08/05/2016 16:36   ECHO revealed EF 15-20%, diffuse hypokinesia, moderate dilatation and hypertrophy and moderate-severe mitral regurgitation.  Cardiac cath revealed 80% occlusion of PLV and 30% RCA occlusion  Consultations: Cardiology   Discharge Instructions: Discharge Instructions    Call MD for:  difficulty breathing, headache or visual disturbances    Complete by:  As directed    Call MD for:  persistant dizziness or light-headedness    Complete by:  As directed    Call MD for:  redness, tenderness, or signs of infection (pain, swelling, redness, odor or green/yellow discharge around incision site)    Complete by:  As directed    Diet - low sodium heart healthy    Complete by:  As directed    Increase activity slowly    Complete by:  As directed       Signed: Ledell Noss, MD 09/01/2016, 7:24 PM   Pager: [phone]

## 2016-08-27 NOTE — Progress Notes (Addendum)
Patient ID: Edward Mullen, male   DOB: March 07, 1956, 61 y.o.   MRN: [id number]     Advanced Heart Failure Rounding Note  Primary Physician: Dr. Marlowe Sax Referring: Dr. [name] Primary Cardiologist: None Reason for Consultation: CHF  Subjective:    Breathing improved, weight has continued to fall.  Creatinine stable.   RHC/LHC 08/27/16:  Coronary Findings   Dominance: Right  Left Main  No significant disease.  Left Anterior Descending  Luminal irregularities.  Ramus Intermedius  Large, no significant disease.  Left Circumflex  No significant disease.  Right Coronary Artery  30% mid RCA stenosis. There are 2 PLV branches, there is an 80% stenosis proximally in the more inferior branch.  Right Heart   Right Heart Pressures RHC Procedural Findings: Hemodynamics (mmHg) RA mean 5 RV 45/9 PA 47/20, mean 32 PCWP mean 22 LV 98/18 AO 102/67  Oxygen saturations: PA 60% AO 98%  Cardiac Output (Fick) 4.24  Cardiac Index (Fick) 2.01  PVR 2.35 WU       Objective:   Weight Range: 195 lb 1.7 oz (88.5 kg) Body mass index is 26.46 kg/m.   Vital Signs:   Temp:  [97.7 F (36.5 C)-98.2 F (36.8 C)] 97.7 F (36.5 C) (01/16 0702) Pulse Rate:  [71-83] 71 (01/16 0702) Resp:  [18] 18 (01/16 0702) BP: (113-117)/(84-88) 117/87 (01/16 0702) SpO2:  [99 %-100 %] 99 % (01/16 0702) Weight:  [195 lb 1.7 oz (88.5 kg)] 195 lb 1.7 oz (88.5 kg) (01/16 0219) Last BM Date: 08/26/16  Weight change: Filed Weights   08/25/16 0243 08/26/16 0359 08/27/16 0219  Weight: 208 lb 1.8 oz (94.4 kg) 201 lb 12.8 oz (91.5 kg) 195 lb 1.7 oz (88.5 kg)    Intake/Output:   Intake/Output Summary (Last 24 hours) at 08/27/16 0852 Last data filed at 08/27/16 0458  Gross per 24 hour  Intake             1120 ml  Output             3475 ml  Net            -2355 ml     Physical Exam: General:  Elderly appearing. NAD.  HEENT: normal Neck: supple. JVP 8  cm. Carotids 2+ bilat; no bruits. No  lymphadenopathy or thyromegaly appreciated. Cor: PMI nondisplaced. RRR. 3/6 HSM apex.  Lungs: CTAB, normal effort Abdomen: soft, NT, ND, no HSM. No bruits or masses. +BS  Extremities: no cyanosis, clubbing, rash. No edema.  Neuro: alert & orientedx3, cranial nerves grossly intact. moves all 4 extremities w/o difficulty. Affect pleasant  Telemetry: Reviewed, NSR  Labs: CBC  Recent Labs  08/27/16 0524  WBC 7.5  HGB 12.8*  HCT 40.0  MCV 81.0  PLT 0000000   Basic Metabolic Panel  Recent Labs  08/25/16 0631 08/26/16 0421 08/27/16 0524  NA 139 140 138  K 4.1 4.4 4.4  CL 104 103 101  CO2 27 29 29   GLUCOSE 87 88 101*  BUN 18 18 18   CREATININE 1.38* 1.45* 1.49*  CALCIUM 8.3* 8.5* 8.5*  MG 1.8 1.9  --    Liver Function Tests No results for input(s): AST, ALT, ALKPHOS, BILITOT, PROT, ALBUMIN in the last 72 hours. No results for input(s): LIPASE, AMYLASE in the last 72 hours. Cardiac Enzymes No results for input(s): CKTOTAL, CKMB, CKMBINDEX, TROPONINI in the last 72 hours.  BNP: BNP (last 3 results)  Recent Labs  08/05/16 1640 08/23/16 1520  BNP 1,150.3* 2,218.9*  ProBNP (last 3 results) No results for input(s): PROBNP in the last 8760 hours.   D-Dimer No results for input(s): DDIMER in the last 72 hours. Hemoglobin A1C No results for input(s): HGBA1C in the last 72 hours. Fasting Lipid Panel  Recent Labs  08/25/16 0631  CHOL 115  HDL 28*  LDLCALC 75  TRIG 62  CHOLHDL 4.1   Thyroid Function Tests  Recent Labs  08/24/16 1047  TSH 0.791    Other results:     Imaging/Studies:  No results found.    Medications:     Scheduled Medications: . [MAR Hold] carvedilol  3.125 mg Oral BID WC  . digoxin  0.125 mg Oral Daily  . [MAR Hold] enoxaparin (LOVENOX) injection  40 mg Subcutaneous Q24H  . [MAR Hold] feeding supplement (ENSURE ENLIVE)  237 mL Oral BID BM  . furosemide  40 mg Oral Daily  . [MAR Hold] losartan  25 mg Oral Daily  . [MAR  Hold] multivitamin with minerals  1 tablet Oral Daily  . [MAR Hold] pneumococcal 23 valent vaccine  0.5 mL Intramuscular Tomorrow-1000  . [MAR Hold] potassium chloride  20 mEq Oral BID  . [MAR Hold] senna-docusate  2 tablet Oral QHS  . [MAR Hold] sodium chloride flush  3 mL Intravenous Q12H  . sodium chloride flush  3 mL Intravenous Q12H  . [MAR Hold] spironolactone  25 mg Oral Daily    Infusions: . sodium chloride 10 mL/hr at 08/27/16 0600  . heparin      PRN Medications: sodium chloride, [MAR Hold] acetaminophen **OR** [MAR Hold] acetaminophen, fentaNYL, heparin, lidocaine (PF), midazolam, [MAR Hold] polyethylene glycol, Radial Cocktail/Verapamil only, sodium chloride flush   Assessment/Plan   61 yo with history of HTN, DM and polysubstance abuse as well as medical noncompliance was admitted with acute systolic CHF.   1. Acute on chronic systolic CHF: EF 0000000 with moderate to severe MR on echo.  Nonischemic cardiomyopathy. HTN vs cocaine use vs viral vs ETOH. HIV negative.  Patient does have CAD but not enough to explain cardiomyopathy.  Filling pressures mildly elevated on RHC, weight down considerably now.    - Transition to po Lasix 40 mg daily.   - Continue losartan 25 daily and spironolactone 25 daily.  - Continue Coreg 3.125 mg BID. Probably not a high risk of interaction with cocaine (has alpha blocking affect in addition to beta blockade), but have warned about the possibility for interaction.  - With marginal cardiac output, will add digoxin 0.125 daily.  - Needs to stop cocaine and ETOH (though says he is not a heavy drinker at this point).  - SPEP pending. 2. AKI:  Creatinine stable. 3. HTN: Was off all meds PTA. BP not high.  4. Polysubstance abuse: UDS positive on admission.  - Needs to stop cocaine, ETOH, and smoking and have counseled on same.    Loralie Champagne 08/27/2016 8:52 AM

## 2016-08-27 NOTE — Progress Notes (Signed)
Called by central monitoring.  Patient had 5 beat run of VT, 18 beat run of VT, 13 beat run of VT.  Pt asymptomatic will continue to monitor.Will notify MD and cards.

## 2016-08-27 NOTE — Care Management Note (Addendum)
Case Management Note  Patient Details  Name: Edward Mullen MRN: [id number] Date of Birth: [birth year]  Subjective/Objective:   S/p R and L heart cath, has CHF, will be on po lasix, digoxin and statin, patient goes to the internal medicine clinic, he does not have insurance , will need ast with medications at discharge.  Patient also states he will need a rolling walker at discharge, will inform MD, patient states he can afford the $4 generics at [place].  NCM informed MD of this information, she will put the order in for rolling walker and try to prescribe generics.   NCM will cont to follow for dc needs. NCM spoke with [facility] with [facility] , she will bring rolling walker up to patient's room before discharge. ALso Daphne with CHF states patient can pick up his meds from [facility]  out patient pharmacy.   Action/Plan:   Expected Discharge Date:  08/26/16               Expected Discharge Plan:  Home/Self Care  In-House Referral:     Discharge planning Services  CM Consult, Medication Assistance  Post Acute Care Choice:    Choice offered to:     DME Arranged:    DME Agency:     HH Arranged:    HH Agency:     Status of Service:  In process, will continue to follow  If discussed at Long Length of Stay Meetings, dates discussed:    Additional Comments:  Zenon Mayo, RN 08/27/2016, 10:33 AM

## 2016-08-27 NOTE — Interval H&P Note (Signed)
History and Physical Interval Note:  08/27/2016 7:57 AM  Edward Mullen  has presented today for surgery, with the diagnosis of hf  The various methods of treatment have been discussed with the patient and family. After consideration of risks, benefits and other options for treatment, the patient has consented to  Procedure(s): Right/Left Heart Cath and Coronary Angiography (N/A) as a surgical intervention .  The patient's history has been reviewed, patient examined, no change in status, stable for surgery.  I have reviewed the patient's chart and labs.  Questions were answered to the patient's satisfaction.     Keonta Alsip [employer]

## 2016-08-28 DIAGNOSIS — I422 Other hypertrophic cardiomyopathy: Secondary | ICD-10-CM

## 2016-08-28 DIAGNOSIS — I428 Other cardiomyopathies: Secondary | ICD-10-CM

## 2016-08-28 LAB — CBC
HCT: 39.8 % (ref 39.0–52.0)
Hemoglobin: 12.8 g/dL — ABNORMAL LOW (ref 13.0–17.0)
MCH: 26.4 pg (ref 26.0–34.0)
MCHC: 32.2 g/dL (ref 30.0–36.0)
MCV: 82.1 fL (ref 78.0–100.0)
Platelets: 164 10*3/uL (ref 150–400)
RBC: 4.85 MIL/uL (ref 4.22–5.81)
RDW: 15.3 % (ref 11.5–15.5)
WBC: 8.6 10*3/uL (ref 4.0–10.5)

## 2016-08-28 LAB — BASIC METABOLIC PANEL
Anion gap: 7 (ref 5–15)
BUN: 22 mg/dL — ABNORMAL HIGH (ref 6–20)
CO2: 29 mmol/L (ref 22–32)
Calcium: 8.5 mg/dL — ABNORMAL LOW (ref 8.9–10.3)
Chloride: 101 mmol/L (ref 101–111)
Creatinine, Ser: 1.67 mg/dL — ABNORMAL HIGH (ref 0.61–1.24)
GFR calc Af Amer: 49 mL/min — ABNORMAL LOW (ref >60)
GFR calc non Af Amer: 42 mL/min — ABNORMAL LOW (ref >60)
Glucose, Bld: 99 mg/dL (ref 65–99)
Potassium: 4.8 mmol/L (ref 3.5–5.1)
Sodium: 137 mmol/L (ref 135–145)

## 2016-08-28 LAB — HEPATITIS C ANTIBODY: HCV Ab: <0.1 s/co ratio (ref 0.0–0.9)

## 2016-08-28 LAB — MAGNESIUM: Magnesium: 2.2 mg/dL (ref 1.7–2.4)

## 2016-08-28 MED ORDER — ASPIRIN 81 MG PO CHEW: 81.0000 mg | CHEWABLE_TABLET | Freq: Every day | ORAL | 0 refills | Status: DC

## 2016-08-28 MED FILL — Nitroglycerin IV Soln 100 MCG/ML in D5W: INTRA_ARTERIAL | Qty: 10 | Status: AC

## 2016-08-28 MED FILL — ATORVASTATIN 20 MG TABLET: 20 | 34 days supply | Qty: 34 | Fill #0

## 2016-08-28 MED FILL — ASPIR-LOW EC 81 MG TABLET: 81 | 30 days supply | Qty: 30 | Fill #0

## 2016-08-28 MED FILL — SPIRONOLACTONE 25 MG TABLET: 25 | 34 days supply | Qty: 34 | Fill #0

## 2016-08-28 MED FILL — DIGITEK 125 MCG TABLET: 125 | 34 days supply | Qty: 34 | Fill #0

## 2016-08-28 MED FILL — KLOR-CON M20 TABLET: 20 | 34 days supply | Qty: 34 | Fill #0

## 2016-08-28 MED FILL — LOSARTAN POTASSIUM 25 MG TA: 25 | 34 days supply | Qty: 34 | Fill #0

## 2016-08-28 MED FILL — CARVEDILOL 3.125 MG TABLET: 3.125 | 34 days supply | Qty: 68 | Fill #0

## 2016-08-28 MED FILL — FUROSEMIDE 40 MG TABLET: 40 | 90 days supply | Qty: 100 | Fill #0 | Status: TO

## 2016-08-28 NOTE — Progress Notes (Signed)
   Subjective: Feeling well today. Denies new concerns or complaints. Developed some ventricular tachycardia and chest pain while working with physical therapy yesterday so he stayed overnight for monitoring. He has had no further symptoms since this episode.   Objective:  Vital signs in last 24 hours: Vitals:   08/27/16 2000 08/27/16 2040 08/28/16 0648 08/28/16 0834  BP:  120/84 (!) 115/50 131/84  Pulse:   80 77  Resp: (!) 22 (!) 22 14 (!) 23  Temp:  97.5 F (36.4 C) 97.8 F (36.6 C) 97.8 F (36.6 C)  TempSrc:  Oral Oral Oral  SpO2:  100% 100% 100%  Weight:   193 lb 4.8 oz (87.7 kg)   Height:       General: resting in bed, no distress, breathing comfortably on ambient air HEENT: EOMI, no scleral icterus, NCAT Cardiac: RRR, positive holosystolic murmur at apex Ext: warm and well perfused, trace pitting edema bilaterally Neuro: alert and oriented X3, cranial nerves II-XII grossly intact   Assessment/Plan:  Principal Problem:   Acute systolic CHF (congestive heart failure) (HCC) Active Problems:   HTN (hypertension)   Tobacco abuse   AKI (acute kidney injury) (HCC)   Polysubstance abuse   Non-ischemic cardiomyopathy (HCC)   Non-obstructive hypertrophic cardiomyopathy (HCC)   Mitral regurgitation  CHF exacerbation (HCC) Volume overload Non ischemic cardiomyopathy  Non obstructive hypertrophic cardiomyopathy Nonsustained Vtach  Has had multiple runs of vtach this admission, cardiology has evaluated him this morning and believes that he will be a better candidate for ICD when he has had abstinence from cocaine.  -he has been scheduled for follow up in heart failure clinic -continue aspirin 81 mg qd, atorvastatin 20 mg qd, carvedilol 3.125 mg BID, digoxin 0.125 mg qd, K-dur 20 meq qd, losartan 25 mg qd, spironolactone 12.5 mg qd, lasix 40 meq daily,   DVT Ppx Lovenox   Code Status FULL   Dispo: Anticipated discharge today   Ledell Noss, MD 08/28/2016, 1:55 PM Pager:  [phone]

## 2016-08-28 NOTE — Progress Notes (Addendum)
Patient ID: Edward Mullen, male   DOB: [birth year], 61 y.o.   MRN: [id number]     Advanced Heart Failure Rounding Note  Primary Physician: Dr. Marlowe Sax Referring: Dr. [name] Primary Cardiologist: None Reason for Consultation: CHF  Subjective:    Breathing better, weight down.  He is now on po Lasix.  Had short NSVT runs yesterday, nothing prolonged.   RHC/LHC 08/27/16:  Coronary Findings   Dominance: Right  Left Main  No significant disease.  Left Anterior Descending  Luminal irregularities.  Ramus Intermedius  Large, no significant disease.  Left Circumflex  No significant disease.  Right Coronary Artery  30% mid RCA stenosis. There are 2 PLV branches, there is an 80% stenosis proximally in the more inferior branch.  Right Heart   Right Heart Pressures RHC Procedural Findings: Hemodynamics (mmHg) RA mean 5 RV 45/9 PA 47/20, mean 32 PCWP mean 22 LV 98/18 AO 102/67  Oxygen saturations: PA 60% AO 98%  Cardiac Output (Fick) 4.24  Cardiac Index (Fick) 2.01  PVR 2.35 WU       Objective:   Weight Range: 193 lb 4.8 oz (87.7 kg) Body mass index is 26.22 kg/m.   Vital Signs:   Temp:  [97.5 F (36.4 C)-98 F (36.7 C)] 97.8 F (36.6 C) (01/17 0834) Pulse Rate:  [75-80] 77 (01/17 0834) Resp:  [14-23] 23 (01/17 0834) BP: (115-131)/(50-91) 131/84 (01/17 0834) SpO2:  [99 %-100 %] 100 % (01/17 0834) Weight:  [193 lb 4.8 oz (87.7 kg)] 193 lb 4.8 oz (87.7 kg) (01/17 0648) Last BM Date: 08/28/16  Weight change: Filed Weights   08/26/16 0359 08/27/16 0219 08/28/16 0648  Weight: 201 lb 12.8 oz (91.5 kg) 195 lb 1.7 oz (88.5 kg) 193 lb 4.8 oz (87.7 kg)    Intake/Output:   Intake/Output Summary (Last 24 hours) at 08/28/16 1234 Last data filed at 08/28/16 0800  Gross per 24 hour  Intake             1560 ml  Output             1100 ml  Net              460 ml     Physical Exam: General:  Elderly appearing. NAD.  HEENT: normal Neck: supple. JVP 8  cm.  Carotids 2+ bilat; no bruits. No lymphadenopathy or thyromegaly appreciated. Cor: PMI nondisplaced. RRR. 3/6 HSM apex.  Lungs: CTAB, normal effort Abdomen: soft, NT, ND, no HSM. No bruits or masses. +BS  Extremities: no cyanosis, clubbing, rash. No edema.  Neuro: alert & orientedx3, cranial nerves grossly intact. moves all 4 extremities w/o difficulty. Affect pleasant  Telemetry: Reviewed, NSR with short NSVT yesterday.   Labs: CBC  Recent Labs  08/27/16 0524 08/28/16 0307  WBC 7.5 8.6  HGB 12.8* 12.8*  HCT 40.0 39.8  MCV 81.0 82.1  PLT 176 [id number]   Basic Metabolic Panel  Recent Labs  08/26/16 0421 08/27/16 0524 08/28/16 0307  NA 140 138 137  K 4.4 4.4 4.8  CL 103 101 101  CO2 29 29 29   GLUCOSE 88 101* 99  BUN 18 18 22*  CREATININE 1.45* 1.49* 1.67*  CALCIUM 8.5* 8.5* 8.5*  MG 1.9  --  2.2   Liver Function Tests No results for input(s): AST, ALT, ALKPHOS, BILITOT, PROT, ALBUMIN in the last 72 hours. No results for input(s): LIPASE, AMYLASE in the last 72 hours. Cardiac Enzymes No results for input(s): CKTOTAL, CKMB, CKMBINDEX, TROPONINI in  the last 72 hours.  BNP: BNP (last 3 results)  Recent Labs  08/05/16 1640 08/23/16 1520  BNP 1,150.3* 2,218.9*    ProBNP (last 3 results) No results for input(s): PROBNP in the last 8760 hours.   D-Dimer No results for input(s): DDIMER in the last 72 hours. Hemoglobin A1C No results for input(s): HGBA1C in the last 72 hours. Fasting Lipid Panel No results for input(s): CHOL, HDL, LDLCALC, TRIG, CHOLHDL, LDLDIRECT in the last 72 hours. Thyroid Function Tests No results for input(s): TSH, T4TOTAL, T3FREE, THYROIDAB in the last 72 hours.  Invalid input(s): FREET3  Other results:     Imaging/Studies:  No results found.    Medications:     Scheduled Medications: . aspirin  81 mg Oral Daily  . atorvastatin  20 mg Oral q1800  . carvedilol  3.125 mg Oral BID WC  . digoxin  0.125 mg Oral Daily  . feeding  supplement (ENSURE ENLIVE)  237 mL Oral BID BM  . furosemide  40 mg Oral Daily  . heparin  5,000 Units Subcutaneous Q8H  . losartan  25 mg Oral Daily  . multivitamin with minerals  1 tablet Oral Daily  . pneumococcal 23 valent vaccine  0.5 mL Intramuscular Tomorrow-1000  . potassium chloride  20 mEq Oral BID  . senna-docusate  2 tablet Oral QHS  . spironolactone  25 mg Oral Daily    Infusions:   PRN Medications: acetaminophen **OR** acetaminophen, ondansetron (ZOFRAN) IV, polyethylene glycol   Assessment/Plan   61 yo with history of HTN, DM and polysubstance abuse as well as medical noncompliance was admitted with acute systolic CHF.   1. Acute on chronic systolic CHF: EF 0000000 with moderate to severe MR on echo.  Nonischemic cardiomyopathy. HTN vs cocaine use vs viral vs ETOH. HIV negative.  Patient does have CAD but not enough to explain cardiomyopathy.  Filling pressures mildly elevated on RHC, weight down considerably now.    - Continue po Lasix.    - Continue losartan 25 daily and spironolactone 25 daily.  - Continue Coreg 3.125 mg BID. Probably not a high risk of interaction with cocaine (has alpha blocking affect in addition to beta blockade), but have warned about the possibility for interaction.  - With marginal cardiac output, added digoxin 0.125 daily.  - Needs to stop cocaine and ETOH (though says he is not a heavy drinker at this point).  - SPEP pending. 2. AKI:  Creatinine mildly higher, now on po diuretics. 3. HTN: Was off all meds PTA. BP not high.  4. Polysubstance abuse: UDS positive on admission.  - Needs to stop cocaine, ETOH, and smoking and have counseled on same.  5. NSVT: Short runs yesterday.  Continue beta blocker.  Not ICD candidate until he shows abstinence from cocaine (was positive at admission).   We will arrange followup CHF clinic.  Meds for home:  Lasix 40 daily Coreg 3.125 bid Losartan 25 daily Spironolactone 25 daily ASA 81  Atorvastatin  20 daily Digoxin 0.125 daily  NO potassium supplement.   Loralie Champagne 08/28/2016 12:34 PM

## 2016-09-06 ENCOUNTER — Ambulatory Visit (HOSPITAL_COMMUNITY)
Admit: 2016-09-06 | Discharge: 2016-09-06 | Disposition: A | Discharge status: Home / Self Care | Payer: Medicaid Other | Source: Ambulatory Visit | Attending: Cardiology | Admitting: Cardiology

## 2016-09-06 ENCOUNTER — Encounter (HOSPITAL_COMMUNITY): Payer: Self-pay

## 2016-09-06 VITALS — BP 130/90 | HR 79 | Ht 72.0 in | Wt 200.8 lb

## 2016-09-06 DIAGNOSIS — I5022 Chronic systolic (congestive) heart failure: Secondary | ICD-10-CM | POA: Diagnosis present

## 2016-09-06 DIAGNOSIS — I4729 Other ventricular tachycardia: Secondary | ICD-10-CM | POA: Insufficient documentation

## 2016-09-06 DIAGNOSIS — F1210 Cannabis abuse, uncomplicated: Secondary | ICD-10-CM | POA: Insufficient documentation

## 2016-09-06 DIAGNOSIS — M1990 Unspecified osteoarthritis, unspecified site: Secondary | ICD-10-CM | POA: Diagnosis not present

## 2016-09-06 DIAGNOSIS — I110 Hypertensive heart disease with heart failure: Secondary | ICD-10-CM | POA: Diagnosis not present

## 2016-09-06 DIAGNOSIS — F17210 Nicotine dependence, cigarettes, uncomplicated: Secondary | ICD-10-CM | POA: Insufficient documentation

## 2016-09-06 DIAGNOSIS — I2510 Atherosclerotic heart disease of native coronary artery without angina pectoris: Secondary | ICD-10-CM | POA: Insufficient documentation

## 2016-09-06 DIAGNOSIS — I472 Ventricular tachycardia, unspecified: Secondary | ICD-10-CM | POA: Insufficient documentation

## 2016-09-06 DIAGNOSIS — I428 Other cardiomyopathies: Secondary | ICD-10-CM | POA: Insufficient documentation

## 2016-09-06 DIAGNOSIS — Z79899 Other long term (current) drug therapy: Secondary | ICD-10-CM | POA: Insufficient documentation

## 2016-09-06 DIAGNOSIS — Z8249 Family history of ischemic heart disease and other diseases of the circulatory system: Secondary | ICD-10-CM | POA: Diagnosis not present

## 2016-09-06 DIAGNOSIS — E119 Type 2 diabetes mellitus without complications: Secondary | ICD-10-CM | POA: Diagnosis not present

## 2016-09-06 DIAGNOSIS — Z823 Family history of stroke: Secondary | ICD-10-CM | POA: Insufficient documentation

## 2016-09-06 DIAGNOSIS — N179 Acute kidney failure, unspecified: Secondary | ICD-10-CM | POA: Diagnosis not present

## 2016-09-06 DIAGNOSIS — I5021 Acute systolic (congestive) heart failure: Secondary | ICD-10-CM

## 2016-09-06 DIAGNOSIS — Z7982 Long term (current) use of aspirin: Secondary | ICD-10-CM | POA: Insufficient documentation

## 2016-09-06 DIAGNOSIS — F1910 Other psychoactive substance abuse, uncomplicated: Secondary | ICD-10-CM

## 2016-09-06 DIAGNOSIS — I5023 Acute on chronic systolic (congestive) heart failure: Secondary | ICD-10-CM | POA: Insufficient documentation

## 2016-09-06 DIAGNOSIS — I10 Essential (primary) hypertension: Secondary | ICD-10-CM

## 2016-09-06 HISTORY — DX: Other ventricular tachycardia: I47.29

## 2016-09-06 HISTORY — DX: Ventricular tachycardia: I47.2

## 2016-09-06 LAB — BASIC METABOLIC PANEL
Anion gap: 6 (ref 5–15)
BUN: 18 mg/dL (ref 6–20)
CO2: 26 mmol/L (ref 22–32)
Calcium: 9.1 mg/dL (ref 8.9–10.3)
Chloride: 106 mmol/L (ref 101–111)
Creatinine, Ser: 1.46 mg/dL — ABNORMAL HIGH (ref 0.61–1.24)
GFR calc Af Amer: 58 mL/min — ABNORMAL LOW (ref >60)
GFR calc non Af Amer: 50 mL/min — ABNORMAL LOW (ref >60)
Glucose, Bld: 96 mg/dL (ref 65–99)
Potassium: 4.9 mmol/L (ref 3.5–5.1)
Sodium: 138 mmol/L (ref 135–145)

## 2016-09-06 LAB — DIGOXIN LEVEL: Digoxin Level: 0.4 ng/mL — ABNORMAL LOW (ref 0.8–2.0)

## 2016-09-06 LAB — BRAIN NATRIURETIC PEPTIDE: B Natriuretic Peptide: 960.6 pg/mL — ABNORMAL HIGH (ref 0.0–100.0)

## 2016-09-06 MED ORDER — CARVEDILOL 6.25 MG PO TABS: 6.2500 mg | ORAL_TABLET | Freq: Two times a day (BID) | ORAL | 6 refills | Status: DC

## 2016-09-06 MED FILL — CARVEDILOL 6.25 MG TABLET: 6.25 | 30 days supply | Qty: 60 | Fill #0

## 2016-09-06 NOTE — Patient Instructions (Signed)
Routine lab work today. Will notify you of abnormal results, otherwise no news is good news!  INCREASE Carvedilol (Coreg) to 6.25 mg twice daily. Can "double up" on 3.125 mg tablets you have at home (Take 2 tabs twice daily). New Rx has been sent to your pharmacy for 6.25 mg tablets (Take 1 tablet twice daily).  Follow up 2 weeks with Oda Kilts PA-C.  Follow up 4-5 weeks with Dr. Aundra Dubin.  Do the following things EVERYDAY: 1) Weigh yourself in the morning before breakfast. Write it down and keep it in a log. 2) Take your medicines as prescribed 3) Eat low salt foods-Limit salt (sodium) to 2000 mg per day.  4) Stay as active as you can everyday 5) Limit all fluids for the day to less than 2 liters

## 2016-09-06 NOTE — Progress Notes (Signed)
I met with [name] during his clinic appt.  He says he is doing well and tells me that he has been weighing at home.  I reinforced daily weights as well as when to contact the clinic.  We briefly discussed a low sodium diet and high sodium foods to avoid.  He tells me that he has a pending Disability app and pending Medicaid.  He is living with his daughter and her children in [place].  He is interested in finding other housing as soon as he has disability.  I have sent a message to [facility] LCSW for assistance.  I introduced him to [facility] during his clinic appt and also sent referral to [employer] as I feel he would benefit.  Carole Binning, RN   Agree with above note by Carole Binning, RN.   [name] [address]" [place], [place] 09/06/2016 12:37 PM

## 2016-09-06 NOTE — Progress Notes (Signed)
Advanced Heart Failure Clinic Note   Primary Care: Dr. Marlowe Sax Primary Cardiologist: Dr. Aundra Dubin   HPI:  Edward Mullen is a 61 y.o. male with history of HTN, DM, chronic systolic CHF, and polysubstance abuse.   Admitted [id number] with a/c systolic chf in setting of substance abuse.  Diuresed with IV lasix and meds adjusted as tolerated.  UDS + for cocaine on admission. Down 23 lbs from admission weight with diuresis.  Discharge weight 193 lbs.  He presents today for post hospital follow up. Feels a lot better than when he came in.  Feels like lasix isn't working as well.  Having hesitancy and difficult urinating.  Easiest when he has a BM. Also having frequency. Feels like breathing is better. Still can only walk about 50 feet before getting SOB. Sleeps with 1 pillow, much less orthopneic and no PND.  Occasional lightheadedness with rapid standing but not limited. Still smoking THC. Not doing cocaine and trying to stop smoking.  Only seldom ETOH. No liquor, occasional beer.   ROS: All other systems reviewed and negative except for as mentioning in HPI, Problem List, or Assessment and Plan.   Social: Lives in [place] with daughter  RHC/LHC 08/27/16:  Coronary Findings  Dominance: Right Left Main No sig disease LAD: Luminal Irregularities Ramus Intermedius: Large, No sig disease Left Cx: No sig disease RCA: 30% mid RCA stenosis. There are 2 PLV branches, there is an 80% stenosis proximally in the more inferior branch.  Right Heart  RHC Procedural Findings: Hemodynamics (mmHg) RA mean 5 RV 45/9 PA 47/20, mean 32 PCWP mean 22 LV 98/18 AO 102/67 Oxygen saturations: PA 60% AO 98% Cardiac Output (Fick) 4.24  Cardiac Index (Fick) 2.01  PVR 2.35 WU   Past Medical History: 1. HTN: Not on any meds at home.  2. Type II diabetes 3. Cocaine abuse 4. Active smoking 5. Cardiomyopathy: Uncertain etiology.  - Echo (1/18) with EF 15-20%, moderate LVH, moderate AI, moderate to  severe MR, normal RV size with mildly decreased systolic function, PASP 44 mmHg.    Past Medical History:  Diagnosis Date  . Arthritis    "feels like it in my legs" (09/20/2014)  . Borderline type 2 diabetes mellitus   . Hypertension     Current Outpatient Prescriptions  Medication Sig Dispense Refill  . aspirin 81 MG chewable tablet Chew 1 tablet (81 mg total) by mouth daily. 30 tablet 0  . atorvastatin (LIPITOR) 20 MG tablet Take 1 tablet (20 mg total) by mouth daily at 6 PM. 30 tablet 0  . carvedilol (COREG) 3.125 MG tablet Take 1 tablet (3.125 mg total) by mouth 2 (two) times daily with a meal. 30 tablet 0  . digoxin (LANOXIN) 0.125 MG tablet Take 1 tablet (0.125 mg total) by mouth daily. 30 tablet 0  . furosemide (LASIX) 40 MG tablet Take 1 tablet (40 mg total) by mouth daily. 30 tablet 0  . losartan (COZAAR) 25 MG tablet Take 1 tablet (25 mg total) by mouth daily. 30 tablet 0  . spironolactone (ALDACTONE) 25 MG tablet Take 1 tablet (25 mg total) by mouth daily. 30 tablet 0   No current facility-administered medications for this encounter.     No Known Allergies    Social History   Social History  . Marital status: Married    Spouse name: N/A  . Number of children: N/A  . Years of education: N/A   Occupational History  . 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 Smoking status: Current Every Day Smoker    Packs/day: 0.30    Years: 48.00    Types: Cigarettes  . Smokeless tobacco: Never Used     Comment: cutting back 2 per day  . Alcohol use 3.6 oz/week    6 Cans of beer per week  . Drug use: Yes    Types: Marijuana, Cocaine  . Sexual activity: Yes    Birth control/ protection: None   Other Topics Concern  . Not on file   Social History Narrative  . No narrative on file      Family History  Problem Relation Age of Onset  . Stroke Mother 11  . Heart disease Father 67    Vitals:    09/06/16 1031  BP: 130/90  Pulse: 79  SpO2: 99%  Weight: 200 lb 12.8 oz (91.1 kg)  Height: 6' (1.829 m)   Wt Readings from Last 3 Encounters:  09/06/16 200 lb 12.8 oz (91.1 kg)  08/28/16 193 lb 4.8 oz (87.7 kg)  08/23/16 223 lb 1.6 oz (101.2 kg)     PHYSICAL EXAM: General:  Elderly appearing. NAD.  HEENT: Normal  Neck: supple. JVD 6-7 cm. Carotids 2+ bilat; no bruits. No lymphadenopathy or thyromegaly appreciated. Cor: PMI nondisplaced. RRR. 3/6 HSM heard best at apex  Lungs: clear Abdomen: soft, nontender, nondistended. No hepatosplenomegaly. No bruits or masses. Good bowel sounds. Extremities: no cyanosis, clubbing, rash, edema Neuro: alert & oriented x 3, cranial nerves grossly intact. moves all 4 extremities w/o difficulty. Affect pleasant.  ASSESSMENT & PLAN:  1. Chronic systolic CHF: EF 0000000 with moderate to severe MR on echo. Nonischemic cardiomyopathy. HTN vs cocaine use vs viral vs ETOH. HIV negative. Patient does have CAD but not enough to explain cardiomyopathy.   - Continue Lasix 40 mg daily. Can take extra 40 mg as needed for weight gain of three lbs overnight or 5 lbs within one week.  - Continue losartan 25 daily and spironolactone 25 daily.  - Increase coreg 6.25 mg BID. Explained risk of interaction with cocaine, although coreg also has alpha blocking effect.  Pt verbalize awareness and plans to stay off cocaine. - Continue digoxin 0.125 daily. Check level today.  - Needs to stop cocaine and ETOH (though says he is not a heavy drinker at this point).  - SPEP without M-spike. - Reinforced fluid restriction to < 2 L daily, sodium restriction to less than 2000 mg daily, and the importance of daily weights.   2. AKI:   - Creatinine mildly up on discharge. Recheck BMET today 3. HTN:  - Meds as above.   4. Polysubstance abuse: - Pt states no cocaine but doing THC.   - Encouraged to stop all substance abuse.   5. NSVT: Short runs in hospital. - Continue BB as  above.    - Will check UDS as next visit to see if he is candidate for ICD.   Nurse Navigator to see today.  Consider for Paramedicine.  Will have HFSW see at next visit.  Follow up 2 weeks.   Meds and labs as above.   Shirley Friar, PA-C 09/06/16   Total time spent > 25 minutes. Over half that spent discussing the above.

## 2016-09-10 ENCOUNTER — Telehealth: Payer: Self-pay | Admitting: Licensed Clinical Social Worker

## 2016-09-10 NOTE — Telephone Encounter (Signed)
SW Intern was referred to pt for housing and medicaid concerns. Pt indicated he was looking for independent housing and that he would have medicaid in the next 90 days. Pt has no income therefore independent housing options are limited and dependant on disability approval. Pt has upcoming paramedicine visit and verbalizes understanding of the program. Pt appears to be agreeable and SW intern will continue to coordinate with paramedicine as needed. [place] Intern Raquel Sarna, [place], [place]

## 2016-09-13 ENCOUNTER — Telehealth (HOSPITAL_COMMUNITY): Payer: Self-pay

## 2016-09-13 NOTE — Telephone Encounter (Signed)
Dee with CHF paramedicine called to report patient's BP of 146/100. Did also report patient only taking coreg 6.25 mg once daily (should be BID). Dee to educate patient on medication regimen and recheck BP Monday. Patient had no other complaints/concerns and is otherwise doing well.  Renee Pain, RN

## 2016-09-20 ENCOUNTER — Ambulatory Visit (HOSPITAL_COMMUNITY)
Admission: RE | Admit: 2016-09-20 | Discharge: 2016-09-20 | Disposition: A | Discharge status: Home / Self Care | Payer: Medicaid Other | Source: Ambulatory Visit | Attending: Cardiology | Admitting: Cardiology

## 2016-09-20 ENCOUNTER — Encounter (HOSPITAL_COMMUNITY): Payer: Self-pay

## 2016-09-20 VITALS — BP 118/80 | HR 80 | Wt 202.4 lb

## 2016-09-20 DIAGNOSIS — I5022 Chronic systolic (congestive) heart failure: Secondary | ICD-10-CM | POA: Diagnosis present

## 2016-09-20 DIAGNOSIS — Z823 Family history of stroke: Secondary | ICD-10-CM | POA: Insufficient documentation

## 2016-09-20 DIAGNOSIS — Z7982 Long term (current) use of aspirin: Secondary | ICD-10-CM | POA: Insufficient documentation

## 2016-09-20 DIAGNOSIS — F1910 Other psychoactive substance abuse, uncomplicated: Secondary | ICD-10-CM | POA: Insufficient documentation

## 2016-09-20 DIAGNOSIS — Z8249 Family history of ischemic heart disease and other diseases of the circulatory system: Secondary | ICD-10-CM | POA: Diagnosis not present

## 2016-09-20 DIAGNOSIS — I472 Ventricular tachycardia: Secondary | ICD-10-CM | POA: Diagnosis not present

## 2016-09-20 DIAGNOSIS — Z79899 Other long term (current) drug therapy: Secondary | ICD-10-CM | POA: Insufficient documentation

## 2016-09-20 DIAGNOSIS — I4729 Other ventricular tachycardia: Secondary | ICD-10-CM

## 2016-09-20 DIAGNOSIS — F17210 Nicotine dependence, cigarettes, uncomplicated: Secondary | ICD-10-CM | POA: Insufficient documentation

## 2016-09-20 DIAGNOSIS — I110 Hypertensive heart disease with heart failure: Secondary | ICD-10-CM | POA: Insufficient documentation

## 2016-09-20 DIAGNOSIS — R7303 Prediabetes: Secondary | ICD-10-CM | POA: Insufficient documentation

## 2016-09-20 DIAGNOSIS — I2510 Atherosclerotic heart disease of native coronary artery without angina pectoris: Secondary | ICD-10-CM | POA: Diagnosis not present

## 2016-09-20 DIAGNOSIS — R300 Dysuria: Secondary | ICD-10-CM | POA: Insufficient documentation

## 2016-09-20 DIAGNOSIS — I428 Other cardiomyopathies: Secondary | ICD-10-CM | POA: Insufficient documentation

## 2016-09-20 DIAGNOSIS — I10 Essential (primary) hypertension: Secondary | ICD-10-CM

## 2016-09-20 LAB — BASIC METABOLIC PANEL
Anion gap: 10 (ref 5–15)
BUN: 17 mg/dL (ref 6–20)
CO2: 24 mmol/L (ref 22–32)
Calcium: 9.2 mg/dL (ref 8.9–10.3)
Chloride: 107 mmol/L (ref 101–111)
Creatinine, Ser: 1.38 mg/dL — ABNORMAL HIGH (ref 0.61–1.24)
GFR calc Af Amer: >60 mL/min (ref >60)
GFR calc non Af Amer: 53 mL/min — ABNORMAL LOW (ref >60)
Glucose, Bld: 101 mg/dL — ABNORMAL HIGH (ref 65–99)
Potassium: 4 mmol/L (ref 3.5–5.1)
Sodium: 141 mmol/L (ref 135–145)

## 2016-09-20 MED ORDER — CARVEDILOL 6.25 MG PO TABS: 9.3750 mg | ORAL_TABLET | Freq: Two times a day (BID) | ORAL | 6 refills | Status: DC

## 2016-09-20 NOTE — Progress Notes (Signed)
Advanced Heart Failure Medication Review by a Pharmacist  Does the patient  feel that his/her medications are working for him/her?  yes  Has the patient been experiencing any side effects to the medications prescribed?  no  Does the patient measure his/her own blood pressure or blood glucose at home?  no   Does the patient have any problems obtaining medications due to transportation or finances?   Yes - no insurance currently, working on [employer] - using HF fund   Understanding of regimen: good Understanding of indications: good Potential of compliance: good Patient understands to avoid NSAIDs. Patient understands to avoid decongestants.  Issues to address at subsequent visits: None   Pharmacist comments:  Edward Mullen is a pleasant 61 yo M presenting with [facility] (paramedicine) and his medication bottles. He reports good compliance with his regimen and did not have any specific medication-related questions or concerns for me at this time.   Edward Mullen, PharmD, BCPS, CPP Clinical Pharmacist Pager: [phone] Phone: [phone] 09/20/2016 10:54 AM      Time with patient: 10 minutes Preparation and documentation time: 2 minutes Total time: 12 minutes

## 2016-09-20 NOTE — Progress Notes (Signed)
Advanced Heart Failure Clinic Note   Primary Care: Dr. Marlowe Sax Primary Cardiologist: Dr. Aundra Dubin   HPI:  Edward Mullen is a 61 y.o. male with history of HTN, DM, chronic systolic CHF, and polysubstance abuse.   Admitted [id number] with a/c systolic chf in setting of substance abuse.  Diuresed with IV lasix and meds adjusted as tolerated.  UDS + for cocaine on admission. Down 23 lbs from admission weight with diuresis.  Discharge weight 193 lbs.  He presents today for regular follow up. Last visit coreg increased. Still having some urinary retention but having some burning after. Breathing has been much better. Can walk about 1/2 block before he gets SOB.  Is having some mild orthopnea.  Smoking THC and occasional cigarettes (2-3 daily). Denies lightheadedness or dizziness. Still drinking occasional beer.   ROS: All other systems reviewed and negative except for as mentioning in HPI, Problem List, or Assessment and Plan.   Social: Lives in [place] with daughter  RHC/LHC 08/27/16:  Coronary Findings  Dominance: Right Left Main No sig disease LAD: Luminal Irregularities Ramus Intermedius: Large, No sig disease Left Cx: No sig disease RCA: 30% mid RCA stenosis. There are 2 PLV branches, there is an 80% stenosis proximally in the more inferior branch.  Right Heart  RHC Procedural Findings: Hemodynamics (mmHg) RA mean 5 RV 45/9 PA 47/20, mean 32 PCWP mean 22 LV 98/18 AO 102/67 Oxygen saturations: PA 60% AO 98% Cardiac Output (Fick) 4.24  Cardiac Index (Fick) 2.01  PVR 2.35 WU   Past Medical History: 1. HTN: Not on any meds at home.  2. Type II diabetes 3. Cocaine abuse 4. Active smoking 5. Cardiomyopathy: Uncertain etiology.  - Echo (1/18) with EF 15-20%, moderate LVH, moderate AI, moderate to severe MR, normal RV size with mildly decreased systolic function, PASP 44 mmHg.    Past Medical History:  Diagnosis Date  . Arthritis    "feels like it in my legs"  (09/20/2014)  . Borderline type 2 diabetes mellitus   . Hypertension     Current Outpatient Prescriptions  Medication Sig Dispense Refill  . aspirin 81 MG chewable tablet Chew 1 tablet (81 mg total) by mouth daily. 30 tablet 0  . atorvastatin (LIPITOR) 20 MG tablet Take 1 tablet (20 mg total) by mouth daily at 6 PM. 30 tablet 0  . carvedilol (COREG) 6.25 MG tablet Take 1 tablet (6.25 mg total) by mouth 2 (two) times daily with a meal. 60 tablet 6  . digoxin (LANOXIN) 0.125 MG tablet Take 1 tablet (0.125 mg total) by mouth daily. 30 tablet 0  . furosemide (LASIX) 40 MG tablet Take 1 tablet (40 mg total) by mouth daily. 30 tablet 0  . losartan (COZAAR) 25 MG tablet Take 1 tablet (25 mg total) by mouth daily. 30 tablet 0  . spironolactone (ALDACTONE) 25 MG tablet Take 1 tablet (25 mg total) by mouth daily. 30 tablet 0   No current facility-administered medications for this encounter.     No Known Allergies    Social History   Social History  . Marital status: Married    Spouse name: N/A  . Number of children: N/A  . 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 Smoking status: Current Every Day Smoker    Packs/day: 0.30    Years: 48.00  Types: Cigarettes  . Smokeless tobacco: Never Used     Comment: cutting back 2 per day  . Alcohol use 3.6 oz/week    6 Cans of beer per week  . Drug use: Yes    Types: Marijuana, Cocaine  . Sexual activity: Yes    Birth control/ protection: None   Other Topics Concern  . Not on file   Social History Narrative  . No narrative on file      Family History  Problem Relation Age of Onset  . Stroke Mother 26  . Heart disease Father 59    Vitals:   09/20/16 1040  BP: 118/80  Pulse: 80  SpO2: 99%  Weight: 202 lb 6 oz (91.8 kg)   Wt Readings from Last 3 Encounters:  09/20/16 202 lb 6 oz (91.8 kg)  09/06/16 200 lb  12.8 oz (91.1 kg)  08/28/16 193 lb 4.8 oz (87.7 kg)     PHYSICAL EXAM: General:  Elderly appearing. NAD.  HEENT: Normal Neck: supple. JVD 6-7 cm. Carotids 2+ bilat; no bruits. No lymphadenopathy or thyromegaly appreciated. Cor: PMI nondisplaced. RRR. 3/6 HSM heard best at apex  Lungs: CTAB, normal effort Abdomen: soft, NT, ND, no HSM. No bruits or masses. +BS  Extremities: no cyanosis, clubbing, rash. No peripheral edema.  Neuro: alert & oriented x 3, cranial nerves grossly intact. moves all 4 extremities w/o difficulty. Affect pleasant.  ASSESSMENT & PLAN:  1. Chronic systolic CHF: EF 0000000 with moderate to severe MR on echo. Nonischemic cardiomyopathy. HTN vs cocaine use vs viral vs ETOH. HIV negative. Patient does have CAD but not enough to explain cardiomyopathy.   - Continue Lasix 40 mg daily. Can take extra 40 mg as needed for weight gain of three lbs overnight or 5 lbs within one week.  - Continue losartan 25 daily and spironolactone 25 daily. BMET today.  - Increase coreg to 9.375 mg BID.  Explained risk of interaction with cocaine, although coreg also has alpha blocking effect.  Pt again verbalized awareness.  - Continue digoxin 0.125 daily. Level stable 09/06/16. - SPEP without M-spike. - Reinforced fluid restriction to < 2 L daily, sodium restriction to less than 2000 mg daily, and the importance of daily weights.   2. HTN:  - Meds as above.    3. Polysubstance abuse: - Pt states no cocaine but doing THC.   - Encouraged to stop all illicit substances.  4. NSVT: Short runs in hospital. - Continue BB as above.    - Will check Tox serum to see if he is candidate for ICD.  5. Dysuria - Needs to follow up with PCP.   Doing well. Continue paramedicine. Meds and labs as above. Follow up 4 weeks.  Will have HFSW call and see if there are any additional needs we can help him with.   Shirley Friar, PA-C 09/20/16   Total time spent > 25 minutes. Over half that spent  discussing the above.

## 2016-09-20 NOTE — Patient Instructions (Signed)
Routine lab work today. Will notify you of abnormal results, otherwise no news is good news!  INCREASE Carvedilol (Coreg) to 9.375 mg (1.5 tabs) twice daily.  Follow up 4 weeks with Oda Kilts PA-C.  Do the following things EVERYDAY: 1) Weigh yourself in the morning before breakfast. Write it down and keep it in a log. 2) Take your medicines as prescribed 3) Eat low salt foods-Limit salt (sodium) to 2000 mg per day.  4) Stay as active as you can everyday 5) Limit all fluids for the day to less than 2 liters

## 2016-09-23 ENCOUNTER — Other Ambulatory Visit: Payer: Self-pay | Admitting: *Deleted

## 2016-09-23 ENCOUNTER — Telehealth: Payer: Self-pay | Admitting: Licensed Clinical Social Worker

## 2016-09-23 MED ORDER — ASPIRIN 81 MG PO CHEW: 81.0000 mg | CHEWABLE_TABLET | Freq: Every day | ORAL | 3 refills | Status: DC

## 2016-09-23 NOTE — Telephone Encounter (Signed)
CSW referred to follow up with patient by PA for support and resources. CSW contacted patient by phone. Patient very distracted by children in the background and difficulty hearing conversation due to noise in home. Patient reports he has a pending application with Social Security and is following up with needed documents to complete the application. Patient also stated hopes for independent housing soon. Patient has a follow up AHF appointment the end of this month and will follow up further with CSW at that time. Patient is known to paramedicine and reports positive feedback form the program. Patient appears to be motivated and following up with disability process. CSW will follow up with paramedics and with patient during next AHF appointment. Raquel Sarna, [place], [place]

## 2016-09-27 ENCOUNTER — Other Ambulatory Visit (HOSPITAL_COMMUNITY): Payer: Self-pay

## 2016-09-27 MED FILL — DIGITEK 125 MCG TABLET: 125 | 34 days supply | Qty: 34 | Fill #1

## 2016-09-27 MED FILL — CARVEDILOL 6.25 MG TABLET: 6.25 | 30 days supply | Qty: 60 | Fill #1

## 2016-09-27 MED FILL — LOSARTAN POTASSIUM 25 MG TA: 25 | 34 days supply | Qty: 34 | Fill #1

## 2016-09-27 MED FILL — ATORVASTATIN 20 MG TABLET: 20 | 34 days supply | Qty: 34 | Fill #1

## 2016-09-27 MED FILL — SPIRONOLACTONE 25 MG TABLET: 25 | 34 days supply | Qty: 34 | Fill #1

## 2016-09-27 NOTE — Progress Notes (Signed)
Paramedicine Encounter    Patient ID: Edward Mullen, male    DOB: 1956-07-18, 61 y.o.   MRN: [id number]   Patient Care Team: Shela Leff, MD as PCP - General  Patient Active Problem List   Diagnosis Date Noted  . Chronic systolic heart failure ([place]) 09/06/2016  . NSVT (nonsustained ventricular tachycardia) ([place]) 09/06/2016  . Non-ischemic cardiomyopathy ([place]) 08/27/2016  . Non-obstructive hypertrophic cardiomyopathy ([place]) 08/27/2016  . Mitral regurgitation 08/27/2016  . Polysubstance abuse   . Blurred vision, bilateral 09/20/2014  . Tobacco abuse 09/19/2014  . HTN (hypertension) 03/28/2011  . Claudication of both lower extremities ([place]) 03/28/2011  . Erectile dysfunction 03/28/2011    Current Outpatient Prescriptions:  .  aspirin 81 MG chewable tablet, Chew 1 tablet (81 mg total) by mouth daily., Disp: 90 tablet, Rfl: 3 .  atorvastatin (LIPITOR) 20 MG tablet, Take 1 tablet (20 mg total) by mouth daily at 6 PM., Disp: 30 tablet, Rfl: 0 .  carvedilol (COREG) 6.25 MG tablet, Take 1.5 tablets (9.375 mg total) by mouth 2 (two) times daily with a meal., Disp: 90 tablet, Rfl: 6 .  digoxin (LANOXIN) 0.125 MG tablet, Take 1 tablet (0.125 mg total) by mouth daily., Disp: 30 tablet, Rfl: 0 .  furosemide (LASIX) 40 MG tablet, Take 1 tablet (40 mg total) by mouth daily., Disp: 30 tablet, Rfl: 0 .  losartan (COZAAR) 25 MG tablet, Take 1 tablet (25 mg total) by mouth daily., Disp: 30 tablet, Rfl: 0 .  spironolactone (ALDACTONE) 25 MG tablet, Take 1 tablet (25 mg total) by mouth daily., Disp: 30 tablet, Rfl: 0 No Known Allergies   Social History   Social History  . Marital status: Married    Spouse name: N/A  . Number of children: N/A  . 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 Smoking status: Current Every Day Smoker    Packs/day: 0.30    Years: 48.00    Types: Cigarettes  . Smokeless tobacco: Never Used     Comment: cutting back 2 per day  . Alcohol use 3.6 oz/week    6 Cans of beer per week  . Drug use: Yes    Types: Marijuana, Cocaine  . Sexual activity: Yes    Birth control/ protection: None   Other Topics Concern  . Not on file   Social History Narrative  . No narrative on file    Physical Exam      Future Appointments Date Time Provider [place]  10/08/2016 10:00 AM [facility] CLINIC [facility] None  10/18/2016 10:30 AM [facility] PA/NP [facility] None   ATF pt CAO x4 with no complaints today. Pt stated that he is feeling a lot better now that he is taking his rx.  Pt has no issues with his appitite and is staying within the low sodium diet. Pt stated that he did has a beer yesterday.  Pt denies sob, dizziness, headaches, and chest pain.  Pt has no edema in legs or feet. dig filled until tues (no pill in wed) losartan filled until tues (no pill in wed) spiro filled until Tuesday (no pill in wed) asa not filled pt will include in pill box today after he picks it up. Rx bottles and pill box verified.   Refills called : digvitek, carvedilol, spiro, astrvastatin, aspirin, losartan (cone out pt)  BP 120/90 (BP Location: Left Arm, Patient Position: Sitting, Cuff Size: Normal)   Pulse 60   Resp 16   Wt 199 lb (90.3 kg)   SpO2 98%   BMI 26.99 kg/m   Weight yesterday-200 Last visit weight-199    ACTION: Home visit completed Next visit planned for tues

## 2016-10-01 ENCOUNTER — Other Ambulatory Visit: Payer: Self-pay | Admitting: Internal Medicine

## 2016-10-01 ENCOUNTER — Other Ambulatory Visit (HOSPITAL_COMMUNITY): Payer: Self-pay

## 2016-10-01 ENCOUNTER — Telehealth (HOSPITAL_COMMUNITY): Payer: Self-pay | Admitting: *Deleted

## 2016-10-01 LAB — THC,MS,WB/SP RFX
Cannabidiol: NEGATIVE ng/mL
Cannabinoid Confirmation: POSITIVE
Cannabinol: NEGATIVE ng/mL
Carboxy-THC: 25.4 ng/mL
Hydroxy-THC: NEGATIVE ng/mL
Tetrahydrocannabinol(THC): 2.9 ng/mL

## 2016-10-01 NOTE — Telephone Encounter (Signed)
Dee called to let us know pt's wt is up about 5 lbs, no edema or sob, pt has been taking meds including Lasix 40 mg daily.  Per last OV note pt can take an extra 40 mg as needed for wt gain, advised Dee to have pt take an extra 40 mg now and to call either her or [place] tomorrow if wt not down, she is agreeable and will advise pt.

## 2016-10-01 NOTE — Progress Notes (Signed)
Paramedicine Encounter    Patient ID: Edward Mullen, male    DOB: [birth year], 61 y.o.   MRN: [id number]   Patient Care Team: Shela Leff, MD as PCP - General  Patient Active Problem List   Diagnosis Date Noted  . Chronic systolic heart failure ([place]) 09/06/2016  . NSVT (nonsustained ventricular tachycardia) ([place]) 09/06/2016  . Non-ischemic cardiomyopathy ([place]) 08/27/2016  . Non-obstructive hypertrophic cardiomyopathy ([place]) 08/27/2016  . Mitral regurgitation 08/27/2016  . Polysubstance abuse   . Blurred vision, bilateral 09/20/2014  . Tobacco abuse 09/19/2014  . HTN (hypertension) 03/28/2011  . Claudication of both lower extremities ([place]) 03/28/2011  . Erectile dysfunction 03/28/2011    Current Outpatient Prescriptions:  .  aspirin 81 MG chewable tablet, Chew 1 tablet (81 mg total) by mouth daily., Disp: 90 tablet, Rfl: 3 .  atorvastatin (LIPITOR) 20 MG tablet, Take 1 tablet (20 mg total) by mouth daily at 6 PM., Disp: 30 tablet, Rfl: 0 .  carvedilol (COREG) 6.25 MG tablet, Take 1.5 tablets (9.375 mg total) by mouth 2 (two) times daily with a meal., Disp: 90 tablet, Rfl: 6 .  digoxin (LANOXIN) 0.125 MG tablet, Take 1 tablet (0.125 mg total) by mouth daily., Disp: 30 tablet, Rfl: 0 .  furosemide (LASIX) 40 MG tablet, Take 1 tablet (40 mg total) by mouth daily., Disp: 30 tablet, Rfl: 0 .  losartan (COZAAR) 25 MG tablet, Take 1 tablet (25 mg total) by mouth daily., Disp: 30 tablet, Rfl: 0 .  spironolactone (ALDACTONE) 25 MG tablet, Take 1 tablet (25 mg total) by mouth daily., Disp: 30 tablet, Rfl: 0 No Known Allergies   Social History   Social History  . Marital status: Married    Spouse name: N/A  . Number of children: N/A  . 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 Smoking status: Current Every Day Smoker    Packs/day: 0.30    Years: 48.00    Types: Cigarettes  . Smokeless tobacco: Never Used     Comment: cutting back 2 per day  . Alcohol use 3.6 oz/week    6 Cans of beer per week  . Drug use: Yes    Types: Marijuana, Cocaine  . Sexual activity: Yes    Birth control/ protection: None   Other Topics Concern  . Not on file   Social History Narrative  . No narrative on file    Physical Exam      SAFE - 09/27/16 1400      Situation   Admitting diagnosis chf   Heart failure history Exisiting   Readmitted within 30 days Yes   Hospital admission within past 12 months Yes   number of hospital admissions 1   number of ED visits 1   Target Weight 205 lbs     Assessment   Lives alone Yes   Primary support person daughter Tammy Sours   Mode of transportation family/friends   Other services involved None   Home equipement Cane;Scale;Walker     Weight   Weighs self daily Yes   Scale provided Yes   Records on weight chart Yes     Resources   Has "Living better w/heart failure" book Yes   Has HF Zone tool Yes   Able to identify yellow zone signs/when to call MD Yes   Records  zone daily Yes     Medications   Uses a pill box Yes   Who stocks the pill box chp   Pill box checked this visit Yes   Pill box refilled this visit Yes   Difficulty obtaining medications No   Mail order medications No   Missed one or more doses of medications per week Yes   How many missed doses this week 1     Nutrition   Patient receives meals on wheels No   Patient follows low sodium diet Yes   Has foods at home that meet the current recommended diet Yes   Patient follows low sugar/card diet Yes   Nutritional concerns/issues none     Activity Level   ADL's/Mobility Independent   How many feet can patient ambulate 75   Typical activity level normal   Barriers none   Actvity tolerance: NYHA Class 3     Urine   Difficulty urinating No   Changes in urine None     Time spent with patient   Time spent with patient  60  Minutes        Future Appointments Date Time Provider [place]  10/08/2016 10:00 AM [place] [facility] None  10/18/2016 10:30 AM [facility] PA/NP [facility] None   ATF pt CAO x4 with complaining of left hip pain. Pt showed me his calender and he has an increase in weight since yesterday. Pt stated that he did drink water, beer, and tea yesterday but did not keep up with how much he had drank.  Pt denies SOB, headaches, dizziness, and chest pain.  No edema noted. Pt has been taking all of his rx as prescribed. Called heather extra 40 lasix (1 pill) tonight; pt took pill prior to [facility] leaving. Pt will [facility] up with [facility] tomorrow to report his weight.  rx bottles and pill box verified.  Pt is currently out of asa which refill was called in today.    Refill called: [id number] asa; was not included in pill box today  BP 138/90 (BP Location: Left Arm, Patient Position: Sitting, Cuff Size: Normal)   Pulse 65   Resp 16   Wt 202 lb (91.6 kg)   SpO2 98%   BMI 27.40 kg/m   Weight yesterday-197 Last visit weight-199    ACTION: Home visit completed Next visit planned for next week at clinic

## 2016-10-02 LAB — DRUG SCREEN 10 W/CONF, SERUM
Amphetamines, IA: NEGATIVE ng/mL
Barbiturates, IA: NEGATIVE ug/mL
Benzodiazepines, IA: NEGATIVE ng/mL
Cocaine & Metabolite, IA: POSITIVE ng/mL
Methadone, IA: NEGATIVE ng/mL
Opiates, IA: NEGATIVE ng/mL
Oxycodones, IA: NEGATIVE ng/mL
Phencyclidine, IA: NEGATIVE ng/mL
Propoxyphene, IA: NEGATIVE ng/mL
THC(Marijuana) Metabolite, IA: POSITIVE ng/mL

## 2016-10-02 LAB — COCAINE,MS,WB/SP RFX
Benzoylecgonine: 76 ng/mL
Cocaine Confirmation: POSITIVE
Cocaine: NEGATIVE ng/mL

## 2016-10-02 MED FILL — ASPIR-LOW EC 81 MG TABLET: 81 | 30 days supply | Qty: 30 | Fill #0

## 2016-10-08 ENCOUNTER — Ambulatory Visit (HOSPITAL_COMMUNITY)
Admission: RE | Admit: 2016-10-08 | Discharge: 2016-10-08 | Disposition: A | Discharge status: Home / Self Care | Payer: Medicaid Other | Source: Ambulatory Visit | Attending: Cardiology | Admitting: Cardiology

## 2016-10-08 ENCOUNTER — Encounter (HOSPITAL_COMMUNITY): Payer: Self-pay | Admitting: Pharmacist

## 2016-10-08 ENCOUNTER — Other Ambulatory Visit (HOSPITAL_COMMUNITY): Payer: Self-pay

## 2016-10-08 ENCOUNTER — Encounter (HOSPITAL_COMMUNITY): Payer: Self-pay

## 2016-10-08 VITALS — BP 132/82 | HR 74 | Wt 201.0 lb

## 2016-10-08 DIAGNOSIS — I428 Other cardiomyopathies: Secondary | ICD-10-CM | POA: Diagnosis not present

## 2016-10-08 DIAGNOSIS — F1210 Cannabis abuse, uncomplicated: Secondary | ICD-10-CM | POA: Insufficient documentation

## 2016-10-08 DIAGNOSIS — Z79899 Other long term (current) drug therapy: Secondary | ICD-10-CM | POA: Diagnosis not present

## 2016-10-08 DIAGNOSIS — F17200 Nicotine dependence, unspecified, uncomplicated: Secondary | ICD-10-CM | POA: Insufficient documentation

## 2016-10-08 DIAGNOSIS — F1410 Cocaine abuse, uncomplicated: Secondary | ICD-10-CM | POA: Insufficient documentation

## 2016-10-08 DIAGNOSIS — I340 Nonrheumatic mitral (valve) insufficiency: Secondary | ICD-10-CM | POA: Diagnosis not present

## 2016-10-08 DIAGNOSIS — Z823 Family history of stroke: Secondary | ICD-10-CM | POA: Insufficient documentation

## 2016-10-08 DIAGNOSIS — Z7982 Long term (current) use of aspirin: Secondary | ICD-10-CM | POA: Insufficient documentation

## 2016-10-08 DIAGNOSIS — Z8249 Family history of ischemic heart disease and other diseases of the circulatory system: Secondary | ICD-10-CM | POA: Insufficient documentation

## 2016-10-08 DIAGNOSIS — F1910 Other psychoactive substance abuse, uncomplicated: Secondary | ICD-10-CM

## 2016-10-08 DIAGNOSIS — R300 Dysuria: Secondary | ICD-10-CM | POA: Insufficient documentation

## 2016-10-08 DIAGNOSIS — E119 Type 2 diabetes mellitus without complications: Secondary | ICD-10-CM | POA: Diagnosis not present

## 2016-10-08 DIAGNOSIS — I2510 Atherosclerotic heart disease of native coronary artery without angina pectoris: Secondary | ICD-10-CM | POA: Diagnosis not present

## 2016-10-08 DIAGNOSIS — I472 Ventricular tachycardia: Secondary | ICD-10-CM | POA: Diagnosis not present

## 2016-10-08 DIAGNOSIS — I5022 Chronic systolic (congestive) heart failure: Secondary | ICD-10-CM | POA: Diagnosis present

## 2016-10-08 DIAGNOSIS — I4729 Other ventricular tachycardia: Secondary | ICD-10-CM

## 2016-10-08 DIAGNOSIS — I110 Hypertensive heart disease with heart failure: Secondary | ICD-10-CM | POA: Diagnosis not present

## 2016-10-08 LAB — URINALYSIS, COMPLETE (UACMP) WITH MICROSCOPIC
Bacteria, UA: NONE SEEN
Bilirubin Urine: NEGATIVE
Glucose, UA: NEGATIVE mg/dL
Hgb urine dipstick: NEGATIVE
Ketones, ur: NEGATIVE mg/dL
Leukocytes, UA: NEGATIVE
Nitrite: NEGATIVE
Protein, ur: NEGATIVE mg/dL
Specific Gravity, Urine: 1.008 (ref 1.005–1.030)
pH: 6 (ref 5.0–8.0)

## 2016-10-08 LAB — BASIC METABOLIC PANEL
Anion gap: 9 (ref 5–15)
BUN: 18 mg/dL (ref 6–20)
CO2: 27 mmol/L (ref 22–32)
Calcium: 9.4 mg/dL (ref 8.9–10.3)
Chloride: 103 mmol/L (ref 101–111)
Creatinine, Ser: 1.5 mg/dL — ABNORMAL HIGH (ref 0.61–1.24)
GFR calc Af Amer: 56 mL/min — ABNORMAL LOW (ref >60)
GFR calc non Af Amer: 48 mL/min — ABNORMAL LOW (ref >60)
Glucose, Bld: 91 mg/dL (ref 65–99)
Potassium: 4.6 mmol/L (ref 3.5–5.1)
Sodium: 139 mmol/L (ref 135–145)

## 2016-10-08 LAB — DIGOXIN LEVEL: Digoxin Level: 0.4 ng/mL — ABNORMAL LOW (ref 0.8–2.0)

## 2016-10-08 MED ORDER — SACUBITRIL-VALSARTAN 24-26 MG PO TABS: 1.0000 | ORAL_TABLET | Freq: Two times a day (BID) | ORAL | 11 refills | Status: DC

## 2016-10-08 MED ORDER — SACUBITRIL-VALSARTAN 24-26 MG PO TABS: 1.0000 | ORAL_TABLET | Freq: Two times a day (BID) | ORAL | 3 refills | Status: DC

## 2016-10-08 MED FILL — ENTRESTO 24 MG-26 MG TABLET: 24-26 | 30 days supply | Qty: 60 | Fill #0

## 2016-10-08 NOTE — Progress Notes (Signed)
Advanced Heart Failure Clinic Note   Primary Care: Dr. Marlowe Sax Primary Cardiologist: Dr. Aundra Dubin   HPI:  Edward Mullen is a 61 y.o. male with history of HTN, DM, chronic systolic CHF, and polysubstance abuse.   Admitted 08/23/16 with acute systolic CHF in setting of substance abuse. Echo was done, showing EF 15-20%.  Diuresed with IV lasix and meds adjusted as tolerated.  UDS + for cocaine on admission. Cardiac cath showed coronary disease but not significant enough to explain cardiomyopathy. Down 23 lbs from admission weight with diuresis.  Discharge weight 193 lbs.  He presents today for followup.  Taking all his meds, followed by paramedicine. Weight down 1 lb.  He is short of breath walking up steps.  He is short of breath walking about 30 yards.  2 pillow orthopnea chronically.  No chest pain.  +Chronic cough.  He has cut back on ETOH, only drinks one beer every 2-3 days. No cocaine.  Down to 2 cigarettes/day.  Does continue to smoke marijuana.   Labs (1/18): Digoxin 0.4, BNP 961 Labs (2/18): K 4, creatinine 1.38  ROS: All other systems reviewed and negative except for as mentioning in HPI, Problem List, or Assessment and Plan.   Social History: Lives in [place] with daughter. Prior heavy ETOH, has cut back.  Active smoker. Prior cocaine.  Active marijuana.   Family History  Problem Relation Age of Onset  . Stroke Mother 12  . Heart disease Father 67   Past Medical History: 1. HTN: Not on any meds at home.  2. Type II diabetes 3. Cocaine abuse 4. Active smoking 5. Cardiomyopathy: Nonischemic.  - Echo (1/18) with EF 15-20%, moderate LVH, moderate AI, moderate to severe MR, normal RV size with mildly decreased systolic function, PASP 44 mmHg.  - LHC/RHC (1/18): 80% stenosis in PLV branch.  Mean RA 5, PA 47/20 mean 32, mean PCWP 22, CI 2.01.  6. CAD: LHC (1/18) with 80% stenosis in a branch of the PLV (not enough CAD to explain cardiomyopathy).  7. Mitral regurgitation:  Moderate to severe on 1/18 echo, probably functional.    Current Outpatient Prescriptions  Medication Sig Dispense Refill  . aspirin 81 MG chewable tablet Chew 1 tablet (81 mg total) by mouth daily. 90 tablet 3  . atorvastatin (LIPITOR) 20 MG tablet Take 1 tablet (20 mg total) by mouth daily at 6 PM. 30 tablet 0  . carvedilol (COREG) 6.25 MG tablet Take 1.5 tablets (9.375 mg total) by mouth 2 (two) times daily with a meal. 90 tablet 6  . digoxin (LANOXIN) 0.125 MG tablet Take 1 tablet (0.125 mg total) by mouth daily. 30 tablet 0  . furosemide (LASIX) 40 MG tablet Take 1 tablet (40 mg total) by mouth daily. 30 tablet 0  . spironolactone (ALDACTONE) 25 MG tablet Take 1 tablet (25 mg total) by mouth daily. 30 tablet 0  . sacubitril-valsartan (ENTRESTO) 24-26 MG Take 1 tablet by mouth 2 (two) times daily. 60 tablet 11   No current facility-administered medications for this encounter.     No Known Allergies    Vitals:   10/08/16 1025  BP: 132/82  Pulse: 74  SpO2: 100%  Weight: 201 lb (91.2 kg)   Wt Readings from Last 3 Encounters:  10/08/16 197 lb (89.4 kg)  10/08/16 201 lb (91.2 kg)  10/01/16 202 lb (91.6 kg)     PHYSICAL EXAM: General:  Elderly appearing. NAD.  HEENT: Normal Neck: supple. JVP 8 cm. Carotids 2+ bilat;  no bruits. No lymphadenopathy or thyromegaly appreciated. Cor: PMI nondisplaced. RRR. 1/6 HSM heard best at apex  Lungs: CTAB, normal effort Abdomen: soft, NT, ND, no HSM. No bruits or masses. +BS  Extremities: no cyanosis, clubbing, rash. No peripheral edema.  Neuro: alert & oriented x 3, cranial nerves grossly intact. moves all 4 extremities w/o difficulty. Affect pleasant.  ASSESSMENT & PLAN:  1. Chronic systolic CHF: EF 0000000 with moderate to severe MR on echo. Nonischemic cardiomyopathy. HTN vs cocaine use vs viral vs ETOH. HIV negative, SPEP with no M-spike. Patient does have CAD but not enough to explain cardiomyopathy.  NYHA class III symptoms but not  volume overloaded on exam.  I wonder if some of his dyspnea is not related to COPD.  - Continue Lasix 40 mg daily. Can take extra 40 mg as needed for weight gain of three lbs overnight or 5 lbs within one week. BMET today.  - Continue spironolactone 25 daily.   - Continue Coreg 9.375 mg bid. Explained risk of interaction with cocaine, although coreg also has alpha blocking effect.  Pt again verbalized awareness.  - Continue digoxin 0.125 daily. Check level today.  - Stop losartan, start Entresto 24/26 bid. BMET in 10 days.  - At next appointment, add Bidil.  - Continue to avoid cocaine and keep ETOH minimal.  - Reinforced fluid restriction to < 2 L daily, sodium restriction to less than 2000 mg daily, and the importance of daily weights.   2. HTN: BP controlled.    3. Polysubstance abuse:  Pt states no cocaine but doing THC.   4. NSVT: Short runs in hospital.  Repeat echo 7/18. If EF remains low and he is abstaining from cocaine, would consider ICD.  QRS not wide enough to benefit from CRT.  5. Dysuria: Will get UA.    Continue paramedicine. Followup in 3 weeks with pharmacist, 6 weeks with me.   Loralie Champagne, MD 10/08/16

## 2016-10-08 NOTE — Patient Instructions (Signed)
Labs today (will call for abnormal results, otherwise no news is good news)  STOP taking Losartan  START taking Entresto 24-26 mg (1 Tablet) Two times Daily  Labs in 10 days (BMET)  Follow up with Doroteo Bradford, PharmD in 3 weeks  Follow up in clinic in 6 weeks.

## 2016-10-08 NOTE — Progress Notes (Signed)
Paramedicine Encounter    Patient ID: Edward Mullen, male    DOB: 25-Apr-1956, 61 y.o.   MRN: [id number]  Patient Care Team: Shela Leff, MD as PCP - General  Patient Active Problem List   Diagnosis Date Noted  . Chronic systolic heart failure ([place]) 09/06/2016  . NSVT (nonsustained ventricular tachycardia) ([place]) 09/06/2016  . Non-ischemic cardiomyopathy ([place]) 08/27/2016  . Non-obstructive hypertrophic cardiomyopathy ([place]) 08/27/2016  . Mitral regurgitation 08/27/2016  . Polysubstance abuse   . Blurred vision, bilateral 09/20/2014  . Tobacco abuse 09/19/2014  . HTN (hypertension) 03/28/2011  . Claudication of both lower extremities ([place]) 03/28/2011  . Erectile dysfunction 03/28/2011    Current Outpatient Prescriptions:  .  aspirin 81 MG chewable tablet, Chew 1 tablet (81 mg total) by mouth daily., Disp: 90 tablet, Rfl: 3 .  atorvastatin (LIPITOR) 20 MG tablet, Take 1 tablet (20 mg total) by mouth daily at 6 PM., Disp: 30 tablet, Rfl: 0 .  carvedilol (COREG) 6.25 MG tablet, Take 1.5 tablets (9.375 mg total) by mouth 2 (two) times daily with a meal., Disp: 90 tablet, Rfl: 6 .  digoxin (LANOXIN) 0.125 MG tablet, Take 1 tablet (0.125 mg total) by mouth daily., Disp: 30 tablet, Rfl: 0 .  furosemide (LASIX) 40 MG tablet, Take 1 tablet (40 mg total) by mouth daily., Disp: 30 tablet, Rfl: 0 .  spironolactone (ALDACTONE) 25 MG tablet, Take 1 tablet (25 mg total) by mouth daily., Disp: 30 tablet, Rfl: 0 .  sacubitril-valsartan (ENTRESTO) 24-26 MG, Take 1 tablet by mouth 2 (two) times daily., Disp: 60 tablet, Rfl: 11 No Known Allergies   Social History   Social History  . Marital status: Married    Spouse name: N/A  . Number of children: N/A  . 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 Smoking status: Current Every Day Smoker    Packs/day: 0.30    Years:  48.00    Types: Cigarettes  . Smokeless tobacco: Never Used     Comment: cutting back 2 per day  . Alcohol use 3.6 oz/week    6 Cans of beer per week  . Drug use: Yes    Types: Marijuana, Cocaine  . Sexual activity: Yes    Birth control/ protection: None   Other Topics Concern  . Not on file   Social History Narrative  . No narrative on file    Physical Exam  Eyes: Pupils are equal, round, and reactive to light.  Pulmonary/Chest: No respiratory distress. He has no wheezes. He has no rales.  Abdominal: He exhibits no distension. There is no tenderness. There is no rebound and no guarding.  Musculoskeletal: He exhibits no edema.  Skin: Skin is warm and dry. He is not diaphoretic.        SAFE - 09/27/16 1400      Situation   Admitting diagnosis chf   Heart failure history Exisiting   Readmitted within 30 days Yes   Hospital admission within past 12 months Yes   number of hospital admissions 1   number of ED visits 1   Target Weight 205 lbs     Assessment   Lives alone Yes   Primary support person daughter Tammy Sours   Mode of transportation family/friends   Other services involved None   Home equipement Cane;Scale;Walker  Weight   Weighs self daily Yes   Scale provided Yes   Records on weight chart Yes     Resources   Has "Living better w/heart failure" book Yes   Has HF Zone tool Yes   Able to identify yellow zone signs/when to call MD Yes   Records zone daily Yes     Medications   Uses a pill box Yes   Who stocks the pill box chp   Pill box checked this visit Yes   Pill box refilled this visit Yes   Difficulty obtaining medications No   Mail order medications No   Missed one or more doses of medications per week Yes   How many missed doses this week 1     Nutrition   Patient receives meals on wheels No   Patient follows low sodium diet Yes   Has foods at home that meet the current recommended diet Yes   Patient follows low sugar/card diet  Yes   Nutritional concerns/issues none     Activity Level   ADL's/Mobility Independent   How many feet can patient ambulate 75   Typical activity level normal   Barriers none   Actvity tolerance: NYHA Class 3     Urine   Difficulty urinating No   Changes in urine None     Time spent with patient   Time spent with patient  60 Minutes        Future Appointments Date Time Provider [place]  10/18/2016 11:00 AM [facility] LAB [facility] None  10/30/2016 2:00 PM [facility] PHARMACY [facility] None  11/22/2016 10:20 AM [facility] CLINIC [facility] None   Clinic visit pt stated that he slept sitting up last due to SOB.  Pt smoked a cigarette yesterday but has been trying to stop. Baked chicken peanut butter. Pt took his medications prior to clinic visit.  Pt is to stop losartan due to him begnining entresto.  Pt's pill bottles and pill box verified. Pt had lab work and will return on 10/18/16 CHP will pick entresto for pt today and revise his pill box.  Stopped losartan and begin entresto  BP 132/82 (BP Location: Left Arm, Patient Position: Sitting, Cuff Size: Normal)   Pulse 74   Resp 16   Wt 197 lb (89.4 kg)   SpO2 100%   BMI 26.72 kg/m   Weight yesterday-196 Last visit weight-199  ACTION: Home visit completed

## 2016-10-11 ENCOUNTER — Telehealth (HOSPITAL_COMMUNITY): Payer: Self-pay | Admitting: Pharmacist

## 2016-10-11 NOTE — Telephone Encounter (Signed)
[employer] patient assistance approved for [employer] 24-26 mg BID through 10/09/17.   Ruta Hinds. Velva Harman, PharmD, BCPS, CPP Clinical Pharmacist Pager: [phone] Phone: [phone] 10/11/2016 3:39 PM

## 2016-10-14 ENCOUNTER — Telehealth (HOSPITAL_COMMUNITY): Payer: Self-pay | Admitting: *Deleted

## 2016-10-14 ENCOUNTER — Other Ambulatory Visit (HOSPITAL_COMMUNITY): Payer: Self-pay

## 2016-10-14 NOTE — Telephone Encounter (Signed)
Dee with Paramed called to report patient has had a 5 lb weight gain since last Tuesday.  Patient is not having any shortness of breath or swelling with weight gain.  Oda Kilts, [place] advises patient to take an extra 40 mg Lasix today and tomorrow.  I called Dee back and made her aware, she will call patient and make him aware.  No further questions at this time.

## 2016-10-14 NOTE — Progress Notes (Signed)
Paramedicine Encounter    Patient ID: Edward Mullen, male    DOB: 1955/12/09, 61 y.o.   MRN: [id number]   Patient Care Team: Shela Leff, MD as PCP - General  Patient Active Problem List   Diagnosis Date Noted  . Chronic systolic heart failure ([place]) 09/06/2016  . NSVT (nonsustained ventricular tachycardia) ([place]) 09/06/2016  . Non-ischemic cardiomyopathy ([place]) 08/27/2016  . Non-obstructive hypertrophic cardiomyopathy ([place]) 08/27/2016  . Mitral regurgitation 08/27/2016  . Polysubstance abuse   . Blurred vision, bilateral 09/20/2014  . Tobacco abuse 09/19/2014  . HTN (hypertension) 03/28/2011  . Claudication of both lower extremities ([place]) 03/28/2011  . Erectile dysfunction 03/28/2011    Current Outpatient Prescriptions:  .  aspirin 81 MG chewable tablet, Chew 1 tablet (81 mg total) by mouth daily., Disp: 90 tablet, Rfl: 3 .  atorvastatin (LIPITOR) 20 MG tablet, Take 1 tablet (20 mg total) by mouth daily at 6 PM., Disp: 30 tablet, Rfl: 0 .  carvedilol (COREG) 6.25 MG tablet, Take 1.5 tablets (9.375 mg total) by mouth 2 (two) times daily with a meal., Disp: 90 tablet, Rfl: 6 .  digoxin (LANOXIN) 0.125 MG tablet, Take 1 tablet (0.125 mg total) by mouth daily., Disp: 30 tablet, Rfl: 0 .  furosemide (LASIX) 40 MG tablet, Take 1 tablet (40 mg total) by mouth daily., Disp: 30 tablet, Rfl: 0 .  sacubitril-valsartan (ENTRESTO) 24-26 MG, Take 1 tablet by mouth 2 (two) times daily., Disp: 60 tablet, Rfl: 11 .  spironolactone (ALDACTONE) 25 MG tablet, Take 1 tablet (25 mg total) by mouth daily., Disp: 30 tablet, Rfl: 0 No Known Allergies   Social History   Social History  . Marital status: Married    Spouse name: N/A  . Number of children: N/A  . 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 Smoking status: Current Every Day Smoker    Packs/day: 0.30   Years: 48.00    Types: Cigarettes  . Smokeless tobacco: Never Used     Comment: cutting back 2 per day  . Alcohol use 3.6 oz/week    6 Cans of beer per week  . Drug use: Yes    Types: Marijuana, Cocaine  . Sexual activity: Yes    Birth control/ protection: None   Other Topics Concern  . Not on file   Social History Narrative  . No narrative on file    Physical Exam  Eyes: Pupils are equal, round, and reactive to light.  Pulmonary/Chest: No respiratory distress. He has no wheezes. He has no rales.  Abdominal: He exhibits no distension. There is no tenderness. There is no rebound and no guarding.  Musculoskeletal: He exhibits edema.  Skin: Skin is warm and dry. He is not diaphoretic.        SAFE - 09/27/16 1400      Situation   Admitting diagnosis chf   Heart failure history Exisiting   Readmitted within 30 days Yes   Hospital admission within past 12 months Yes   number of hospital admissions 1   number of ED visits 1   Target Weight 205 lbs     Assessment   Lives alone Yes   Primary support person daughter Tammy Sours   Mode of transportation family/friends   Other services involved None   Home equipement Cane;Scale;Walker  Weight   Weighs self daily Yes   Scale provided Yes   Records on weight chart Yes     Resources   Has "Living better w/heart failure" book Yes   Has HF Zone tool Yes   Able to identify yellow zone signs/when to call MD Yes   Records zone daily Yes     Medications   Uses a pill box Yes   Who stocks the pill box chp   Pill box checked this visit Yes   Pill box refilled this visit Yes   Difficulty obtaining medications No   Mail order medications No   Missed one or more doses of medications per week Yes   How many missed doses this week 1     Nutrition   Patient receives meals on wheels No   Patient follows low sodium diet Yes   Has foods at home that meet the current recommended diet Yes   Patient follows low sugar/card  diet Yes   Nutritional concerns/issues none     Activity Level   ADL's/Mobility Independent   How many feet can patient ambulate 75   Typical activity level normal   Barriers none   Actvity tolerance: NYHA Class 3     Urine   Difficulty urinating No   Changes in urine None     Time spent with patient   Time spent with patient  60 Minutes        Future Appointments Date Time Provider [place]  10/18/2016 11:00 AM [facility] LAB [facility] None  10/30/2016 2:00 PM [facility] PHARMACY [facility] None  11/22/2016 10:20 AM [facility] CLINIC [facility] None   ATF pt CAO x4 with no complaints today.  Pt stated that he has been stressed over his food stamps benefits.  Pt denies sob, dizziness, and headaches. 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  Pt stated that he had one beer and a drink of "liquor". Pt stated that he is really trying to stop drinking. rx bottles and pill box verified. *refill needed:  carvediol (wednesday)  BP 118/82 (BP Location: Left Arm, Patient Position: Sitting, Cuff Size: Normal)   Pulse 60   Resp 16   Wt 202 lb (91.6 kg)   SpO2 98%   BMI 27.40 kg/m   Weight yesterday-202 Last visit weight-197    ACTION: Home visit completed Next visit planned for next tuesday

## 2016-10-15 ENCOUNTER — Telehealth (HOSPITAL_COMMUNITY): Payer: Self-pay | Admitting: *Deleted

## 2016-10-15 NOTE — Telephone Encounter (Signed)
Records requested from [facility] on patient since January 2017- Present.  Records faxed today to [phone] as requested.  Copy of request will be scanned to patient's electronic record.

## 2016-10-18 ENCOUNTER — Inpatient Hospital Stay (HOSPITAL_COMMUNITY): Admission: RE | Admit: 2016-10-18 | Payer: Self-pay | Source: Ambulatory Visit

## 2016-10-18 ENCOUNTER — Ambulatory Visit (HOSPITAL_COMMUNITY)
Admission: RE | Admit: 2016-10-18 | Discharge: 2016-10-18 | Disposition: A | Discharge status: Home / Self Care | Payer: Medicaid Other | Source: Ambulatory Visit | Attending: Cardiology | Admitting: Cardiology

## 2016-10-18 DIAGNOSIS — I5022 Chronic systolic (congestive) heart failure: Secondary | ICD-10-CM | POA: Diagnosis not present

## 2016-10-18 LAB — BASIC METABOLIC PANEL
Anion gap: 9 (ref 5–15)
BUN: 21 mg/dL — ABNORMAL HIGH (ref 6–20)
CO2: 28 mmol/L (ref 22–32)
Calcium: 9.1 mg/dL (ref 8.9–10.3)
Chloride: 102 mmol/L (ref 101–111)
Creatinine, Ser: 1.44 mg/dL — ABNORMAL HIGH (ref 0.61–1.24)
GFR calc Af Amer: 59 mL/min — ABNORMAL LOW (ref >60)
GFR calc non Af Amer: 51 mL/min — ABNORMAL LOW (ref >60)
Glucose, Bld: 92 mg/dL (ref 65–99)
Potassium: 3.7 mmol/L (ref 3.5–5.1)
Sodium: 139 mmol/L (ref 135–145)

## 2016-10-22 ENCOUNTER — Other Ambulatory Visit (HOSPITAL_COMMUNITY): Payer: Self-pay

## 2016-10-22 MED FILL — CARVEDILOL 6.25 MG TABLET: 6.25 | 30 days supply | Qty: 60 | Fill #2

## 2016-10-22 NOTE — Progress Notes (Signed)
Paramedicine Encounter    Patient ID: Edward Mullen, male    DOB: October 24, 1955, 61 y.o.   MRN: [id number]   Patient Care Team: Shela Leff, MD as PCP - General  Patient Active Problem List   Diagnosis Date Noted  . Chronic systolic heart failure ([place]) 09/06/2016  . NSVT (nonsustained ventricular tachycardia) ([place]) 09/06/2016  . Non-ischemic cardiomyopathy ([place]) 08/27/2016  . Non-obstructive hypertrophic cardiomyopathy ([place]) 08/27/2016  . Mitral regurgitation 08/27/2016  . Polysubstance abuse   . Blurred vision, bilateral 09/20/2014  . Tobacco abuse 09/19/2014  . HTN (hypertension) 03/28/2011  . Claudication of both lower extremities ([place]) 03/28/2011  . Erectile dysfunction 03/28/2011    Current Outpatient Prescriptions:  .  aspirin 81 MG chewable tablet, Chew 1 tablet (81 mg total) by mouth daily., Disp: 90 tablet, Rfl: 3 .  atorvastatin (LIPITOR) 20 MG tablet, Take 1 tablet (20 mg total) by mouth daily at 6 PM., Disp: 30 tablet, Rfl: 0 .  carvedilol (COREG) 6.25 MG tablet, Take 1.5 tablets (9.375 mg total) by mouth 2 (two) times daily with a meal., Disp: 90 tablet, Rfl: 6 .  digoxin (LANOXIN) 0.125 MG tablet, Take 1 tablet (0.125 mg total) by mouth daily., Disp: 30 tablet, Rfl: 0 .  furosemide (LASIX) 40 MG tablet, Take 1 tablet (40 mg total) by mouth daily., Disp: 30 tablet, Rfl: 0 .  sacubitril-valsartan (ENTRESTO) 24-26 MG, Take 1 tablet by mouth 2 (two) times daily., Disp: 60 tablet, Rfl: 11 .  spironolactone (ALDACTONE) 25 MG tablet, Take 1 tablet (25 mg total) by mouth daily., Disp: 30 tablet, Rfl: 0 No Known Allergies   Social History   Social History  . Marital status: Married    Spouse name: N/A  . Number of children: N/A  . 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 Smoking status: Current Every Day Smoker    Packs/day: 0.30   Years: 48.00    Types: Cigarettes  . Smokeless tobacco: Never Used     Comment: cutting back 2 per day  . Alcohol use 3.6 oz/week    6 Cans of beer per week  . Drug use: Yes    Types: Marijuana, Cocaine  . Sexual activity: Yes    Birth control/ protection: None   Other Topics Concern  . Not on file   Social History Narrative  . No narrative on file    Physical Exam  Eyes: Pupils are equal, round, and reactive to light.  Pulmonary/Chest: No respiratory distress. He has no wheezes. He has no rales.  Abdominal: He exhibits no distension. There is no guarding.  Musculoskeletal: He exhibits no edema.  Skin: Skin is warm and dry. He is not diaphoretic.        SAFE - 09/27/16 1400      Situation   Admitting diagnosis chf   Heart failure history Exisiting   Readmitted within 30 days Yes   Hospital admission within past 12 months Yes   number of hospital admissions 1   number of ED visits 1   Target Weight 205 lbs     Assessment   Lives alone Yes   Primary support person daughter Tammy Sours   Mode of transportation family/friends   Other services involved None   Home equipement Cane;Scale;Walker     Weight   Weighs self  daily Yes   Scale provided Yes   Records on weight chart Yes     Resources   Has "Living better w/heart failure" book Yes   Has HF Zone tool Yes   Able to identify yellow zone signs/when to call MD Yes   Records zone daily Yes     Medications   Uses a pill box Yes   Who stocks the pill box chp   Pill box checked this visit Yes   Pill box refilled this visit Yes   Difficulty obtaining medications No   Mail order medications No   Missed one or more doses of medications per week Yes   How many missed doses this week 1     Nutrition   Patient receives meals on wheels No   Patient follows low sodium diet Yes   Has foods at home that meet the current recommended diet Yes   Patient follows low sugar/card diet Yes   Nutritional  concerns/issues none     Activity Level   ADL's/Mobility Independent   How many feet can patient ambulate 75   Typical activity level normal   Barriers none   Actvity tolerance: NYHA Class 3     Urine   Difficulty urinating No   Changes in urine None     Time spent with patient   Time spent with patient  60 Minutes        Future Appointments Date Time Provider [place]  10/30/2016 2:00 PM [facility] PHARMACY [facility] None  11/22/2016 10:20 AM [facility] CLINIC [facility] None   ATF pt CAO x4, pt was walking around the neighborhood upon my arrival.  Pt stated that he only had to stop once during his walk.  Pt stated that he is trying to"out of the house, because too many people live there". Pt expressed desire to move out into his own apartment once he is approved for disability.  Pt has no complaints today.  Pt had refilled his own pill box this week.  I checked his pill box and adjusted his medications according to his most recent list.  I showed pt what I was changing so he will know for the next time.  Pt denies SOB, dizziness, headache and chest pain.  Pt stated that he drank a lot of water yesterday due to him being very thirsty. heart clinic called and made aware of the weight gain.  rx bottles and pill box verified.      BP 112/84 (BP Location: Left Arm, Patient Position: Sitting, Cuff Size: Normal)   Pulse 86   Resp 16   Wt 200 lb (90.7 kg)   SpO2 95%   BMI 27.12 kg/m   Weight yesterday-196 Last visit weight-202    ACTION: Home visit completed

## 2016-10-23 ENCOUNTER — Telehealth (HOSPITAL_COMMUNITY): Payer: Self-pay

## 2016-10-23 NOTE — Telephone Encounter (Signed)
Received call from paramedicine program stating patient gained 3-4 lbs overnight but also states patient reports drinking extra fluid yesterday. Patient has no complaints, able to walk around neighborhood without dyspnea, no edema, VSS. Continue to monitor at this time and call CHF clinic if weight does not trend back down or becomes s/s.  Renee Pain, RN

## 2016-10-30 ENCOUNTER — Other Ambulatory Visit (HOSPITAL_COMMUNITY): Payer: Self-pay

## 2016-10-30 ENCOUNTER — Ambulatory Visit (HOSPITAL_COMMUNITY)
Admission: RE | Admit: 2016-10-30 | Discharge: 2016-10-30 | Disposition: A | Discharge status: Home / Self Care | Payer: Medicaid Other | Source: Ambulatory Visit | Attending: Internal Medicine | Admitting: Internal Medicine

## 2016-10-30 VITALS — BP 136/80 | HR 62 | Wt 201.4 lb

## 2016-10-30 DIAGNOSIS — I471 Supraventricular tachycardia: Secondary | ICD-10-CM | POA: Diagnosis not present

## 2016-10-30 DIAGNOSIS — F1210 Cannabis abuse, uncomplicated: Secondary | ICD-10-CM | POA: Insufficient documentation

## 2016-10-30 DIAGNOSIS — I5022 Chronic systolic (congestive) heart failure: Secondary | ICD-10-CM | POA: Diagnosis present

## 2016-10-30 DIAGNOSIS — Z79899 Other long term (current) drug therapy: Secondary | ICD-10-CM | POA: Diagnosis not present

## 2016-10-30 DIAGNOSIS — I110 Hypertensive heart disease with heart failure: Secondary | ICD-10-CM | POA: Diagnosis present

## 2016-10-30 DIAGNOSIS — I428 Other cardiomyopathies: Secondary | ICD-10-CM | POA: Insufficient documentation

## 2016-10-30 DIAGNOSIS — E119 Type 2 diabetes mellitus without complications: Secondary | ICD-10-CM | POA: Insufficient documentation

## 2016-10-30 MED ORDER — SACUBITRIL-VALSARTAN 49-51 MG PO TABS: 1.0000 | ORAL_TABLET | Freq: Two times a day (BID) | ORAL | 11 refills | Status: DC

## 2016-10-30 MED FILL — DIGITEK 125 MCG TABLET: 125 | 34 days supply | Qty: 34 | Fill #2

## 2016-10-30 MED FILL — ATORVASTATIN 20 MG TABLET: 20 | 34 days supply | Qty: 34 | Fill #2

## 2016-10-30 MED FILL — SPIRONOLACTONE 25 MG TABLET: 25 | 34 days supply | Qty: 34 | Fill #2

## 2016-10-30 NOTE — Patient Instructions (Signed)
It was great to see you today!  Please INCREASE your Entresto 49-51 mg TWICE DAILY.  Labs in 1 week on 11/06/16.   Please keep your appointment with Dr. Aundra Dubin on 11/22/16.

## 2016-10-30 NOTE — Progress Notes (Signed)
HF MD: [facility]  HPI:  Edward Mullen is a 61 y.o. AA male with history of HTN, DM, chronic systolic CHF, and polysubstance abuse.   Admitted 08/23/16 with acute systolic CHF in setting of substance abuse. Echo was done, showing EF 20-25%.  Diuresed with IV lasix and meds adjusted as tolerated.  UDS + for cocaine on admission. Cardiac cath showed coronary disease but not significant enough to explain cardiomyopathy. Down 23 lbs from admission weight with diuresis.  Discharge weight 193 lbs.  He presents today for pharmacist-led HF medication titration.  Taking all his meds, followed by paramedicine. He is short of breath walking up steps and his legs get tired easily.  He is short of breath walking about 30 yards.  No chest pain.  +Chronic cough.  He has cut back on ETOH, only drinks one beer every week. Down to 1-2 cigarettes/day.  Does continue to smoke marijuana every other day but denies cocaine use. He does state that he lives with his daughter, 3 grandchildren and 1 great grandchild. He is working on disability, food stamps and Medicaid but is having difficulty. Will get Kennyth Lose, CSW involved to assist.        . Shortness of breath/dyspnea on exertion? yes  . Orthopnea/PND? Yes - 2 pillows (stable)  . Edema? no . Lightheadedness/dizziness? Yes - just over the stove in the last week  . Daily weights at home? Yes - 196-206 lb  . Blood pressure/heart rate monitoring at home? Yes - paramedicine weekly  . Following low-sodium/fluid-restricted diet? Yes - no salty foods; < 2L fluid/day   HF Medications: Carvedilol 9.375 mg PO BID Digoxin 0.125 mg PO daily Furosemide 40 mg PO daily Entresto 24-26 mg PO BID Spironolactone 25 mg PO daily   Has the patient been experiencing any side effects to the medications prescribed?  yes  Does the patient have any problems obtaining medications due to transportation or finances?   Yes - no insurance currently, working on [employer] (using HF fund and  [employer] patient assistance for [employer])  Understanding of regimen: fair Understanding of indications: fair Potential of compliance: good Patient understands to avoid NSAIDs. Patient understands to avoid decongestants.    Pertinent Lab Values: . 10/18/16: Serum creatinine 1.44 (BL ~1.4), BUN 21, Potassium 3.7, Sodium 139  Vital Signs: . Weight: 201 lb (dry weight: 201 lb) . Blood pressure: 136/80 mmHg  . Heart rate: 62 bpm    Assessment: 1. Chronicsystolic CHF (EF 45-40%), due to NICM 2/2 HTN vs cocaine vs viral vs ETOH. NYHA class II-IIIsymptoms.  - Volume status ok today but still complaining of dyspnea on exertion and home weight up 4 lbs  - Increase Entresto to 49-51 mg BID to help with BP and volume  - Continue carvedilol 9.375 mg BID, digoxin 0.125 mg daily, furosemide 40 mg daily and spironolactone 25 mg daily   - Consider Bidil at next visit - if his Medicaid is approved, it should be covered otherwise consider patient assistance or hydralazine + Imdur through HF fund - Basic disease state pathophysiology, medication indication, mechanism and side effects reviewed at length with patient and he verbalized understanding 2. HTN  - BP slightly above goal < 130/80 mmHg  - Increase Entresto as above 3. Polysubstance abuse  - Pt reports no recent cocaine use but admits to smoking marijuana every other day  - Down to 1 beer per week and 1-2 cigarettes per day 4. NSVT  - Repeat echo 7/18. If EF remains  low and he is abstaining from cocaine, would consider ICD.  QRS not wide enough to benefit from CRT.    Plan: 1) Medication changes: Based on clinical presentation, vital signs and recent labs will increase Entresto to 49-51 mg BID 2) Labs: BMET in 1 week 3) Follow-up: Dr. Aundra Dubin on 11/22/16   Ruta Hinds. Velva Harman, PharmD, BCPS, CPP Clinical Pharmacist Pager: [phone] Phone: [phone] 10/30/2016 2:02 PM

## 2016-10-30 NOTE — Progress Notes (Signed)
Paramedicine Encounter    Patient ID: Edward Mullen, male    DOB: 11/17/1955, 61 y.o.   MRN: [id number]  Patient Care Team: Shela Leff, MD as PCP - General  Patient Active Problem List   Diagnosis Date Noted  . Chronic systolic heart failure ([place]) 09/06/2016  . NSVT (nonsustained ventricular tachycardia) ([place]) 09/06/2016  . Non-ischemic cardiomyopathy ([place]) 08/27/2016  . Non-obstructive hypertrophic cardiomyopathy ([place]) 08/27/2016  . Mitral regurgitation 08/27/2016  . Polysubstance abuse   . Blurred vision, bilateral 09/20/2014  . Tobacco abuse 09/19/2014  . HTN (hypertension) 03/28/2011  . Claudication of both lower extremities ([place]) 03/28/2011  . Erectile dysfunction 03/28/2011    Current Outpatient Prescriptions:  .  aspirin 81 MG chewable tablet, Chew 1 tablet (81 mg total) by mouth daily., Disp: 90 tablet, Rfl: 3 .  atorvastatin (LIPITOR) 20 MG tablet, Take 1 tablet (20 mg total) by mouth daily at 6 PM., Disp: 30 tablet, Rfl: 0 .  carvedilol (COREG) 6.25 MG tablet, Take 1.5 tablets (9.375 mg total) by mouth 2 (two) times daily with a meal., Disp: 90 tablet, Rfl: 6 .  digoxin (LANOXIN) 0.125 MG tablet, Take 1 tablet (0.125 mg total) by mouth daily., Disp: 30 tablet, Rfl: 0 .  furosemide (LASIX) 40 MG tablet, Take 1 tablet (40 mg total) by mouth daily., Disp: 30 tablet, Rfl: 0 .  sacubitril-valsartan (ENTRESTO) 49-51 MG, Take 1 tablet by mouth 2 (two) times daily., Disp: 60 tablet, Rfl: 11 .  spironolactone (ALDACTONE) 25 MG tablet, Take 1 tablet (25 mg total) by mouth daily., Disp: 30 tablet, Rfl: 0 No Known Allergies   Social History   Social History  . Marital status: Married    Spouse name: N/A  . Number of children: N/A  . 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 Smoking status: Current Every Day Smoker    Packs/day: 0.30    Years:  48.00    Types: Cigarettes  . Smokeless tobacco: Never Used     Comment: cutting back 2 per day  . Alcohol use 3.6 oz/week    6 Cans of beer per week  . Drug use: Yes    Types: Marijuana, Cocaine  . Sexual activity: Yes    Birth control/ protection: None   Other Topics Concern  . Not on file   Social History Narrative  . No narrative on file    Physical Exam      SAFE - 09/27/16 1400      Situation   Admitting diagnosis chf   Heart failure history Exisiting   Readmitted within 30 days Yes   Hospital admission within past 12 months Yes   number of hospital admissions 1   number of ED visits 1   Target Weight 205 lbs     Assessment   Lives alone Yes   Primary support person daughter Tammy Sours   Mode of transportation family/friends   Other services involved None   Home equipement Cane;Scale;Walker     Weight   Weighs self daily Yes   Scale provided Yes   Records on weight chart Yes     Resources   Has "Living better w/heart failure" book Yes   Has HF Zone tool Yes   Able to identify yellow zone signs/when to call MD Yes   Records zone  daily Yes     Medications   Uses a pill box Yes   Who stocks the pill box chp   Pill box checked this visit Yes   Pill box refilled this visit Yes   Difficulty obtaining medications No   Mail order medications No   Missed one or more doses of medications per week Yes   How many missed doses this week 1     Nutrition   Patient receives meals on wheels No   Patient follows low sodium diet Yes   Has foods at home that meet the current recommended diet Yes   Patient follows low sugar/card diet Yes   Nutritional concerns/issues none     Activity Level   ADL's/Mobility Independent   How many feet can patient ambulate 75   Typical activity level normal   Barriers none   Actvity tolerance: NYHA Class 3     Urine   Difficulty urinating No   Changes in urine None     Time spent with patient   Time spent with  patient  60 Minutes        Future Appointments Date Time Provider [place]  11/06/2016 10:30 AM [facility] LAB [facility] None  11/22/2016 10:20 AM [facility] CLINIC [facility] None   CLINIC VISIT  Met pt in the clinic, pt has an appointment to see [place], heart failure pharmacist.  Pt has no complaints today.      ACTION: Home visit completed

## 2016-10-31 ENCOUNTER — Other Ambulatory Visit (HOSPITAL_COMMUNITY): Payer: Self-pay | Admitting: Pharmacist

## 2016-10-31 MED ORDER — SACUBITRIL-VALSARTAN 49-51 MG PO TABS: 1.0000 | ORAL_TABLET | Freq: Two times a day (BID) | ORAL | 11 refills | Status: DC

## 2016-10-31 NOTE — Progress Notes (Signed)
CSW referred to assist patient with PCP and insurance. Patient reports he had a pending medicaid application but recently received word that he was denied. Patient reports that he has been seen in the IM clinic in the past and would like to remain with IM clinic as it is convenient. Patient shared he has not applied for the [facility] card as of yet. Patient identified transportation as an issue to appointments and typically uses the bus. CSW encouraged patient to follow up with IM clinic and schedule appointment to see MD as well as apply for [facility] card. CSW also provided patient with bus passes to get home and return for IM clinic appointment. Patient states plans to go to IM clinic today and will follow up as instructed. CSW will continue to be available as needed. Raquel Sarna, [place], [place]

## 2016-11-05 ENCOUNTER — Other Ambulatory Visit (HOSPITAL_COMMUNITY): Payer: Self-pay

## 2016-11-05 NOTE — Progress Notes (Signed)
Paramedicine Encounter    Patient ID: Edward Mullen, male    DOB: 1956/05/30, 61 y.o.   MRN: [id number]  Patient Care Team: Shela Leff, MD as PCP - General  Patient Active Problem List   Diagnosis Date Noted  . Chronic systolic heart failure ([place]) 09/06/2016  . NSVT (nonsustained ventricular tachycardia) ([place]) 09/06/2016  . Non-ischemic cardiomyopathy ([place]) 08/27/2016  . 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 Mitral regurgitation 08/27/2016  . Polysubstance abuse   . Blurred vision, bilateral 09/20/2014  . Tobacco abuse 09/19/2014  . HTN (hypertension) 03/28/2011  . Claudication of both lower extremities ([place]) 03/28/2011  . Erectile dysfunction 03/28/2011    Current Outpatient Prescriptions:  .  aspirin 81 MG chewable tablet, Chew 1 tablet (81 mg total) by mouth daily., Disp: 90 tablet, Rfl: 3 .  atorvastatin (LIPITOR) 20 MG tablet, Take 1 tablet (20 mg total) by mouth daily at 6 PM., Disp: 30 tablet, Rfl: 0 .  carvedilol (COREG) 6.25 MG tablet, Take 1.5 tablets (9.375 mg total) by mouth 2 (two) times daily with a meal., Disp: 90 tablet, Rfl: 6 .  digoxin (LANOXIN) 0.125 MG tablet, Take 1 tablet (0.125 mg total) by mouth daily., Disp: 30 tablet, Rfl: 0 .  furosemide (LASIX) 40 MG tablet, Take 1 tablet (40 mg total) by mouth daily., Disp: 30 tablet, Rfl: 0 .  sacubitril-valsartan (ENTRESTO) 49-51 MG, Take 1 tablet by mouth 2 (two) times daily., Disp: 60 tablet, Rfl: 11 .  spironolactone (ALDACTONE) 25 MG tablet, Take 1 tablet (25 mg total) by mouth daily., Disp: 30 tablet, Rfl: 0 No Known Allergies   Social History   Social History  . Marital status: Married    Spouse name: N/A  . Number of children: N/A  . 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 Smoking status: Current Every Day Smoker    Packs/day: 0.30    Years:  48.00    Types: Cigarettes  . Smokeless tobacco: Never Used     Comment: cutting back 2 per day  . Alcohol use 3.6 oz/week    6 Cans of beer per week  . Drug use: Yes    Types: Marijuana, Cocaine  . Sexual activity: Yes    Birth control/ protection: None   Other Topics Concern  . Not on file   Social History Narrative  . No narrative on file    Physical Exam  Eyes: Pupils are equal, round, and reactive to light.  Pulmonary/Chest: No respiratory distress. He has no wheezes. He has no rales.  Abdominal: He exhibits no distension. There is no tenderness. There is no guarding.  Musculoskeletal: He exhibits no edema.  Skin: Skin is warm and dry. He is not diaphoretic.        SAFE - 09/27/16 1400      Situation   Admitting diagnosis chf   Heart failure history Exisiting   Readmitted within 30 days Yes   Hospital admission within past 12 months Yes   number of hospital admissions 1   number of ED visits 1   Target Weight 205 lbs     Assessment   Lives alone Yes   Primary support person daughter Tammy Sours   Mode of transportation family/friends   Other services involved None   Home equipement Cane;Scale;Walker  Weight   Weighs self daily Yes   Scale provided Yes   Records on weight chart Yes     Resources   Has "Living better w/heart failure" book Yes   Has HF Zone tool Yes   Able to identify yellow zone signs/when to call MD Yes   Records zone daily Yes     Medications   Uses a pill box Yes   Who stocks the pill box chp   Pill box checked this visit Yes   Pill box refilled this visit Yes   Difficulty obtaining medications No   Mail order medications No   Missed one or more doses of medications per week Yes   How many missed doses this week 1     Nutrition   Patient receives meals on wheels No   Patient follows low sodium diet Yes   Has foods at home that meet the current recommended diet Yes   Patient follows low sugar/card diet Yes    Nutritional concerns/issues none     Activity Level   ADL's/Mobility Independent   How many feet can patient ambulate 75   Typical activity level normal   Barriers none   Actvity tolerance: NYHA Class 3     Urine   Difficulty urinating No   Changes in urine None     Time spent with patient   Time spent with patient  60 Minutes        Future Appointments Date Time Provider [place]  11/06/2016 10:30 AM [facility] None  11/22/2016 10:20 AM [facility] CLINIC [facility] None   ATF pt CAO x4 with pain behind the legs, pain feels like tightness.  Pt stated that his weight has been "up and down" this past weekend.  Pt stated that he had beer  About 4-5 over the weekend.  Pt has not missed any medication doses.  Pt denies eating foods high in sodium. rx bottles and pill box verified (2 weeks).  BP 128/84 (BP Location: Left Arm, Patient Position: Sitting, Cuff Size: Normal)   Pulse 65   Resp 16   Wt 196 lb (88.9 kg)   SpO2 98%   BMI 26.58 kg/m    **carvedil (until tues evening 2nd box)     entresto (full) Weight yesterday-199 Last visit weight-206    ACTION: Home visit completed Next visit planned for 12/09/16 tuesday

## 2016-11-06 ENCOUNTER — Ambulatory Visit (HOSPITAL_COMMUNITY)
Admission: RE | Admit: 2016-11-06 | Discharge: 2016-11-06 | Disposition: A | Discharge status: Home / Self Care | Payer: Medicaid Other | Source: Ambulatory Visit | Attending: Cardiology | Admitting: Cardiology

## 2016-11-06 DIAGNOSIS — I5022 Chronic systolic (congestive) heart failure: Secondary | ICD-10-CM | POA: Insufficient documentation

## 2016-11-06 LAB — BASIC METABOLIC PANEL
Anion gap: 5 (ref 5–15)
BUN: 18 mg/dL (ref 6–20)
CO2: 31 mmol/L (ref 22–32)
Calcium: 9.6 mg/dL (ref 8.9–10.3)
Chloride: 105 mmol/L (ref 101–111)
Creatinine, Ser: 1.85 mg/dL — ABNORMAL HIGH (ref 0.61–1.24)
GFR calc Af Amer: 43 mL/min — ABNORMAL LOW (ref >60)
GFR calc non Af Amer: 37 mL/min — ABNORMAL LOW (ref >60)
Glucose, Bld: 82 mg/dL (ref 65–99)
Potassium: 4.4 mmol/L (ref 3.5–5.1)
Sodium: 141 mmol/L (ref 135–145)

## 2016-11-12 ENCOUNTER — Encounter (HOSPITAL_COMMUNITY): Payer: Self-pay | Admitting: *Deleted

## 2016-11-14 ENCOUNTER — Encounter (HOSPITAL_COMMUNITY): Payer: Self-pay

## 2016-11-14 NOTE — Progress Notes (Signed)
[place] record request dated 10/22/2016 received via mail 10/30/2016. All records 10/09/2016--present faxed to provided # [phone] (25 pages total) per request. Copy of request scanned into patient's electronic medical record under media tab.  Renee Pain, RN

## 2016-11-15 MED FILL — CARVEDILOL 6.25 MG TABLET: 6.25 | 30 days supply | Qty: 60 | Fill #3 | Status: TO

## 2016-11-19 ENCOUNTER — Other Ambulatory Visit (HOSPITAL_COMMUNITY): Payer: Self-pay

## 2016-11-19 ENCOUNTER — Other Ambulatory Visit (HOSPITAL_COMMUNITY): Payer: Self-pay | Admitting: Pharmacist

## 2016-11-19 NOTE — Progress Notes (Signed)
Paramedicine Encounter    Patient ID: Edward Mullen, male    DOB: 1955-10-23, 61 y.o.   MRN: [id number]  Patient Care Team: Shela Leff, MD as PCP - General  Patient Active Problem List   Diagnosis Date Noted  . Chronic systolic heart failure ([place]) 09/06/2016  . NSVT (nonsustained ventricular tachycardia) ([place]) 09/06/2016  . Non-ischemic cardiomyopathy ([place]) 08/27/2016  . Non-obstructive hypertrophic cardiomyopathy ([place]) 08/27/2016  . Mitral regurgitation 08/27/2016  . Polysubstance abuse   . Blurred vision, bilateral 09/20/2014  . Tobacco abuse 09/19/2014  . HTN (hypertension) 03/28/2011  . Claudication of both lower extremities ([place]) 03/28/2011  . Erectile dysfunction 03/28/2011    Current Outpatient Prescriptions:  .  aspirin 81 MG chewable tablet, Chew 1 tablet (81 mg total) by mouth daily., Disp: 90 tablet, Rfl: 3 .  atorvastatin (LIPITOR) 20 MG tablet, Take 1 tablet (20 mg total) by mouth daily at 6 PM., Disp: 30 tablet, Rfl: 0 .  carvedilol (COREG) 6.25 MG tablet, Take 1.5 tablets (9.375 mg total) by mouth 2 (two) times daily with a meal., Disp: 90 tablet, Rfl: 6 .  digoxin (LANOXIN) 0.125 MG tablet, Take 1 tablet (0.125 mg total) by mouth daily., Disp: 30 tablet, Rfl: 0 .  furosemide (LASIX) 40 MG tablet, Take 1 tablet (40 mg total) by mouth daily., Disp: 30 tablet, Rfl: 0 .  spironolactone (ALDACTONE) 25 MG tablet, Take 1 tablet (25 mg total) by mouth daily., Disp: 30 tablet, Rfl: 0 .  sacubitril-valsartan (ENTRESTO) 49-51 MG, Take 1 tablet by mouth 2 (two) times daily., Disp: 60 tablet, Rfl: 11 No Known Allergies   Social History   Social History  . Marital status: Married    Spouse name: N/A  . Number of children: N/A  . 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 Smoking status: Current Every Day Smoker    Packs/day: 0.30    Years:  48.00    Types: Cigarettes  . Smokeless tobacco: Never Used     Comment: cutting back 2 per day  . Alcohol use 3.6 oz/week    6 Cans of beer per week  . Drug use: Yes    Types: Marijuana, Cocaine  . Sexual activity: Yes    Birth control/ protection: None   Other Topics Concern  . Not on file   Social History Narrative  . No narrative on file    Physical Exam  Eyes: Pupils are equal, round, and reactive to light.  Pulmonary/Chest: No respiratory distress. He has no wheezes. He has no rales.  Musculoskeletal: He exhibits no edema.  Skin: Skin is warm and dry. He is not diaphoretic.        SAFE - 09/27/16 1400      Situation   Admitting diagnosis chf   Heart failure history Exisiting   Readmitted within 30 days Yes   Hospital admission within past 12 months Yes   number of hospital admissions 1   number of ED visits 1   Target Weight 205 lbs     Assessment   Lives alone Yes   Primary support person daughter Tammy Sours   Mode of transportation family/friends   Other services involved None   Home equipement Cane;Scale;Walker     Weight   Weighs self daily Yes   Scale provided Yes  Records on weight chart Yes     Resources   Has "Living better w/heart failure" book Yes   Has HF Zone tool Yes   Able to identify yellow zone signs/when to call MD Yes   Records zone daily Yes     Medications   Uses a pill box Yes   Who stocks the pill box chp   Pill box checked this visit Yes   Pill box refilled this visit Yes   Difficulty obtaining medications No   Mail order medications No   Missed one or more doses of medications per week Yes   How many missed doses this week 1     Nutrition   Patient receives meals on wheels No   Patient follows low sodium diet Yes   Has foods at home that meet the current recommended diet Yes   Patient follows low sugar/card diet Yes   Nutritional concerns/issues none     Activity Level   ADL's/Mobility Independent   How  many feet can patient ambulate 75   Typical activity level normal   Barriers none   Actvity tolerance: NYHA Class 3     Urine   Difficulty urinating No   Changes in urine None     Time spent with patient   Time spent with patient  60 Minutes        Future Appointments Date Time Provider [place]  11/22/2016 10:20 AM [facility] CLINIC [facility] None   ATF pt CAO x4 with hip pain.  Pt stated that his hips are hurting possibly due to arthritis.  Pt denies sob, dizziness, headache and chest pain.  Pt has been taking his medications as prescribed without difficulty.  Pts medicaid application was approved and I advised pt to take the letter with him to his next appointment at the heart clinic.  rx bottles and pill box verified.   **entresto filled until Wednesday evening 1st box  BP 130/82 (BP Location: Left Arm, Patient Position: Sitting, Cuff Size: Normal)   Pulse 66   Wt 196 lb (88.9 kg)   SpO2 99%   BMI 26.58 kg/m   entresto 49-51 called in  Call for next appointment digitek [id number] Carvedilol [id number] Arlyce Harman [id number] Furosemide [id number]  Weight yesterday-194 Last visit [id number]    ACTION: Home visit completed

## 2016-11-22 ENCOUNTER — Ambulatory Visit (HOSPITAL_COMMUNITY)
Admission: RE | Admit: 2016-11-22 | Discharge: 2016-11-22 | Disposition: A | Discharge status: Home / Self Care | Payer: Medicaid Other | Source: Ambulatory Visit | Attending: Cardiology | Admitting: Cardiology

## 2016-11-22 ENCOUNTER — Encounter (HOSPITAL_COMMUNITY): Payer: Self-pay

## 2016-11-22 VITALS — BP 136/86 | HR 60 | Wt 199.0 lb

## 2016-11-22 DIAGNOSIS — I340 Nonrheumatic mitral (valve) insufficiency: Secondary | ICD-10-CM | POA: Insufficient documentation

## 2016-11-22 DIAGNOSIS — I428 Other cardiomyopathies: Secondary | ICD-10-CM | POA: Diagnosis not present

## 2016-11-22 DIAGNOSIS — I130 Hypertensive heart and chronic kidney disease with heart failure and stage 1 through stage 4 chronic kidney disease, or unspecified chronic kidney disease: Secondary | ICD-10-CM | POA: Diagnosis not present

## 2016-11-22 DIAGNOSIS — N183 Chronic kidney disease, stage 3 (moderate): Secondary | ICD-10-CM | POA: Insufficient documentation

## 2016-11-22 DIAGNOSIS — I5022 Chronic systolic (congestive) heart failure: Secondary | ICD-10-CM | POA: Diagnosis present

## 2016-11-22 DIAGNOSIS — E1122 Type 2 diabetes mellitus with diabetic chronic kidney disease: Secondary | ICD-10-CM | POA: Diagnosis not present

## 2016-11-22 DIAGNOSIS — Z8249 Family history of ischemic heart disease and other diseases of the circulatory system: Secondary | ICD-10-CM | POA: Insufficient documentation

## 2016-11-22 DIAGNOSIS — Z7982 Long term (current) use of aspirin: Secondary | ICD-10-CM | POA: Insufficient documentation

## 2016-11-22 DIAGNOSIS — F1910 Other psychoactive substance abuse, uncomplicated: Secondary | ICD-10-CM | POA: Diagnosis not present

## 2016-11-22 DIAGNOSIS — I2510 Atherosclerotic heart disease of native coronary artery without angina pectoris: Secondary | ICD-10-CM | POA: Insufficient documentation

## 2016-11-22 DIAGNOSIS — I472 Ventricular tachycardia: Secondary | ICD-10-CM | POA: Diagnosis not present

## 2016-11-22 DIAGNOSIS — Z79899 Other long term (current) drug therapy: Secondary | ICD-10-CM | POA: Insufficient documentation

## 2016-11-22 DIAGNOSIS — F1210 Cannabis abuse, uncomplicated: Secondary | ICD-10-CM | POA: Diagnosis not present

## 2016-11-22 DIAGNOSIS — Z823 Family history of stroke: Secondary | ICD-10-CM | POA: Diagnosis not present

## 2016-11-22 DIAGNOSIS — F17210 Nicotine dependence, cigarettes, uncomplicated: Secondary | ICD-10-CM | POA: Insufficient documentation

## 2016-11-22 DIAGNOSIS — I4729 Other ventricular tachycardia: Secondary | ICD-10-CM

## 2016-11-22 LAB — DIGOXIN LEVEL: Digoxin Level: 0.6 ng/mL — ABNORMAL LOW (ref 0.8–2.0)

## 2016-11-22 LAB — BASIC METABOLIC PANEL
Anion gap: 9 (ref 5–15)
BUN: 20 mg/dL (ref 6–20)
CO2: 25 mmol/L (ref 22–32)
Calcium: 9 mg/dL (ref 8.9–10.3)
Chloride: 104 mmol/L (ref 101–111)
Creatinine, Ser: 1.38 mg/dL — ABNORMAL HIGH (ref 0.61–1.24)
GFR calc Af Amer: >60 mL/min (ref >60)
GFR calc non Af Amer: 53 mL/min — ABNORMAL LOW (ref >60)
Glucose, Bld: 98 mg/dL (ref 65–99)
Potassium: 4.1 mmol/L (ref 3.5–5.1)
Sodium: 138 mmol/L (ref 135–145)

## 2016-11-22 MED ORDER — ISOSORB DINITRATE-HYDRALAZINE 20-37.5 MG PO TABS
1.0000 | ORAL_TABLET | Freq: Three times a day (TID) | ORAL | 3 refills | Status: DC
Start: 2016-11-22 — End: 2016-12-05

## 2016-11-22 NOTE — Patient Instructions (Signed)
START Bidil 1 tablet three times daily.  Routine lab work today. Will notify you of abnormal results  Follow up with Dr.McLean in 2 months

## 2016-11-24 NOTE — Progress Notes (Signed)
Advanced Heart Failure Clinic Note   Primary Care: Dr. Marlowe Sax Primary Cardiologist: Dr. Aundra Dubin   HPI:  Edward Mullen is a 61 y.o. male with history of HTN, DM, chronic systolic CHF, and polysubstance abuse.   Admitted 08/23/16 with acute systolic CHF in setting of substance abuse. Echo was done, showing EF 15-20%.  Diuresed with IV lasix and meds adjusted as tolerated.  UDS + for cocaine on admission. Cardiac cath showed coronary disease but not significant enough to explain cardiomyopathy. Down 23 lbs from admission weight with diuresis.  Discharge weight 193 lbs.  He was positive for cocaine again in 2/18.    He presents today for followup.  Taking all his meds, followed by paramedicine. Weight down 2 lbs.  He is short of breath walking up steps, walking with walker and generally doing ok on flat ground.  He is short of breath walking about 50 yards.  2 pillow orthopnea chronically.  Rare atypical chest pain.  He has cut back on ETOH, only drinks one beer every 2-3 days. No cocaine since 2/18.  Down to a few cigarettes/day.  Does continue to smoke marijuana.   Labs (1/18): Digoxin 0.4, BNP 961 Labs (2/18): K 4, creatinine 1.38 Labs (3/18): K 4.4, creatinine 1.85, digoxin 0.4  ROS: All other systems reviewed and negative except for as mentioning in HPI, Problem List, or Assessment and Plan.   Social History: Lives in [place] with daughter. Prior heavy ETOH, has cut back.  Active smoker. Prior cocaine (last documented 2/18).  Active marijuana.   Family History  Problem Relation Age of Onset  . Stroke Mother 65  . Heart disease Father 72   Past Medical History: 1. HTN: Not on any meds at home.  2. Type II diabetes 3. Cocaine abuse 4. Active smoking, suspect COPD.  5. Cardiomyopathy: Nonischemic.  - Echo (1/18) with EF 15-20%, moderate LVH, moderate AI, moderate to severe MR, normal RV size with mildly decreased systolic function, PASP 44 mmHg.  - LHC/RHC (1/18): 80%  stenosis in PLV branch.  Mean RA 5, PA 47/20 mean 32, mean PCWP 22, CI 2.01.  6. CAD: LHC (1/18) with 80% stenosis in a branch of the PLV (not enough CAD to explain cardiomyopathy).  7. Mitral regurgitation: Moderate to severe on 1/18 echo, probably functional.  8. CKD: Stage 3.    Current Outpatient Prescriptions  Medication Sig Dispense Refill  . aspirin 81 MG chewable tablet Chew 1 tablet (81 mg total) by mouth daily. 90 tablet 3  . atorvastatin (LIPITOR) 20 MG tablet Take 1 tablet (20 mg total) by mouth daily at 6 PM. 30 tablet 0  . carvedilol (COREG) 6.25 MG tablet Take 1.5 tablets (9.375 mg total) by mouth 2 (two) times daily with a meal. 90 tablet 6  . digoxin (LANOXIN) 0.125 MG tablet Take 1 tablet (0.125 mg total) by mouth daily. 30 tablet 0  . furosemide (LASIX) 40 MG tablet Take 1 tablet (40 mg total) by mouth daily. 30 tablet 0  . sacubitril-valsartan (ENTRESTO) 49-51 MG Take 1 tablet by mouth 2 (two) times daily. 60 tablet 11  . spironolactone (ALDACTONE) 25 MG tablet Take 1 tablet (25 mg total) by mouth daily. 30 tablet 0  . isosorbide-hydrALAZINE (BIDIL) 20-37.5 MG tablet Take 1 tablet by mouth 3 (three) times daily. 90 tablet 3   No current facility-administered medications for this encounter.     No Known Allergies    Vitals:   11/22/16 1022  BP: 136/86  Pulse: 60  SpO2: 100%  Weight: 199 lb (90.3 kg)   Wt Readings from Last 3 Encounters:  11/22/16 199 lb (90.3 kg)  11/19/16 196 lb (88.9 kg)  11/05/16 196 lb (88.9 kg)     PHYSICAL EXAM: General:  Elderly appearing. NAD.  HEENT: Normal Neck: supple. JVP 7 cm. Carotids 2+ bilat; no bruits. No lymphadenopathy or thyromegaly appreciated. Cor: PMI nondisplaced. RRR. 1/6 HSM heard best at apex  Lungs: clear to auscultation bilaterally, normal effort Abdomen: soft, NT, ND, no HSM. No bruits or masses. +BS  Extremities: no cyanosis, clubbing, rash. No edema.  Neuro: alert & oriented x 3, cranial nerves grossly  intact. moves all 4 extremities w/o difficulty. Affect pleasant.  ASSESSMENT & PLAN:  1. Chronic systolic CHF: EF 36-06% with moderate to severe MR on echo 1/18. Nonischemic cardiomyopathy. HTN vs cocaine use vs viral vs ETOH. HIV negative, SPEP with no M-spike. Patient does have CAD but not enough to explain cardiomyopathy.  NYHA class III symptoms but not volume overloaded on exam.  I suspect some of his dyspnea is related to COPD.  - Continue Lasix 40 mg daily. Can take extra 40 mg as needed for weight gain of three lbs overnight or 5 lbs within one week. BMET today.  - Continue spironolactone 25 daily.   - Continue Coreg 9.375 mg bid. Explained risk of interaction with cocaine, although coreg also has alpha blocking effect.  Pt again verbalized awareness.  - Continue digoxin 0.125 daily. Check level today.  - Continue Entresto 49/51 bid.  - Start Bidil 1 tab tid.   - Continue to avoid cocaine and keep ETOH minimal.  - Reinforced fluid restriction to < 2 L daily, sodium restriction to less than 2000 mg daily, and the importance of daily weights.   - If EF remains down on future echo (7/18) and he stays off cocaine, would be candidate for ICD.  Narrow QRS, no CRT.  2. HTN: BP controlled.    3. Polysubstance abuse:  Pt states no cocaine but doing THC.   4. NSVT: Short runs in hospital.  Repeat echo 7/18. If EF remains low and he is abstaining from cocaine, would consider ICD.  QRS not wide enough to benefit from CRT.  5. Smoking: working on cessation.  6. CKD: Stage 3.  Creatinine 1.8 when last checked, repeat today.   Followup in 2 months.   Loralie Champagne, MD 11/24/16

## 2016-12-04 ENCOUNTER — Other Ambulatory Visit (HOSPITAL_COMMUNITY): Payer: Self-pay

## 2016-12-04 NOTE — Progress Notes (Signed)
Paramedicine Encounter    Patient ID: Edward Mullen, male    DOB: [birth year], 61 y.o.   MRN: [id number]    Patient Care Team: Shela Leff, MD as PCP - General  Patient Active Problem List   Diagnosis Date Noted  . Chronic systolic heart failure ([place]) 09/06/2016  . NSVT (nonsustained ventricular tachycardia) ([place]) 09/06/2016  . Non-ischemic cardiomyopathy ([place]) 08/27/2016  . Non-obstructive hypertrophic cardiomyopathy ([place]) 08/27/2016  . Mitral regurgitation 08/27/2016  . Polysubstance abuse   . Blurred vision, bilateral 09/20/2014  . Tobacco abuse 09/19/2014  . HTN (hypertension) 03/28/2011  . Claudication of both lower extremities ([place]) 03/28/2011  . Erectile dysfunction 03/28/2011    Current Outpatient Prescriptions:  .  aspirin 81 MG chewable tablet, Chew 1 tablet (81 mg total) by mouth daily., Disp: 90 tablet, Rfl: 3 .  atorvastatin (LIPITOR) 20 MG tablet, Take 1 tablet (20 mg total) by mouth daily at 6 PM., Disp: 30 tablet, Rfl: 0 .  carvedilol (COREG) 6.25 MG tablet, Take 1.5 tablets (9.375 mg total) by mouth 2 (two) times daily with a meal., Disp: 90 tablet, Rfl: 6 .  digoxin (LANOXIN) 0.125 MG tablet, Take 1 tablet (0.125 mg total) by mouth daily., Disp: 30 tablet, Rfl: 0 .  furosemide (LASIX) 40 MG tablet, Take 1 tablet (40 mg total) by mouth daily., Disp: 30 tablet, Rfl: 0 .  isosorbide-hydrALAZINE (BIDIL) 20-37.5 MG tablet, Take 1 tablet by mouth 3 (three) times daily., Disp: 90 tablet, Rfl: 3 .  sacubitril-valsartan (ENTRESTO) 49-51 MG, Take 1 tablet by mouth 2 (two) times daily., Disp: 60 tablet, Rfl: 11 .  spironolactone (ALDACTONE) 25 MG tablet, Take 1 tablet (25 mg total) by mouth daily., Disp: 30 tablet, Rfl: 0 No Known Allergies   Social History   Social History  . Marital status: Married    Spouse name: N/A  . Number of children: N/A  . 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 Smoking status: Current Every Day Smoker    Packs/day: 0.30    Years: 48.00    Types: Cigarettes  . Smokeless tobacco: Never Used     Comment: cutting back 2 per day  . Alcohol use 3.6 oz/week    6 Cans of beer per week  . Drug use: Yes    Types: Marijuana, Cocaine  . Sexual activity: Yes    Birth control/ protection: None   Other Topics Concern  . Not on file   Social History Narrative  . No narrative on file    Physical Exam  Eyes: Pupils are equal, round, and reactive to light.  Pulmonary/Chest: No respiratory distress. He has no wheezes. He has no rales.  Abdominal: He exhibits no distension. There is no tenderness. There is no guarding.  Musculoskeletal: He exhibits no edema.  Skin: Skin is warm and dry. He is not diaphoretic.        Future Appointments Date Time Provider [place]  01/29/2017 11:20 AM [facility] CLIN[facility] None    ATF pt CAO x4 sitting in the living room c/o constipation.  Pt stated that he is almost out of his medications in the first  Second box.  Pt has been approved for both medicaid and medicare, therefore I helped pt transfer his medications to [facility] on [address].  Pt's pill box  was not efilled today, pt stated that he will pick up his medications today or tomorrow before I return.    BP (!) 128/92 (BP Location: Left Arm, Patient Position: Sitting, Cuff Size: Normal)   Pulse 70   Resp 16   Wt 191 lb (86.6 kg)   SpO2 99%   BMI 25.90 kg/m   Weight yesterday-195 Last visit weight-191    Edward Mullen, EMT Paramedic 12/04/2016    ACTION: Home visit completed Next visit planned for tomorrow

## 2016-12-05 ENCOUNTER — Other Ambulatory Visit (HOSPITAL_COMMUNITY): Payer: Self-pay | Admitting: Cardiology

## 2016-12-05 ENCOUNTER — Other Ambulatory Visit (HOSPITAL_COMMUNITY): Payer: Self-pay | Admitting: Pharmacist

## 2016-12-05 ENCOUNTER — Other Ambulatory Visit (HOSPITAL_COMMUNITY): Payer: Self-pay

## 2016-12-05 MED ORDER — ATORVASTATIN CALCIUM 20 MG PO TABS: 20.0000 mg | ORAL_TABLET | Freq: Every day | ORAL | 3 refills | Status: DC

## 2016-12-05 MED ORDER — DIGOXIN 125 MCG PO TABS: 0.1250 mg | ORAL_TABLET | Freq: Every day | ORAL | 3 refills | Status: DC

## 2016-12-05 MED ORDER — SPIRONOLACTONE 25 MG PO TABS: 25.0000 mg | ORAL_TABLET | Freq: Every day | ORAL | 3 refills | Status: DC

## 2016-12-05 MED ORDER — SACUBITRIL-VALSARTAN 49-51 MG PO TABS: 1.0000 | ORAL_TABLET | Freq: Two times a day (BID) | ORAL | 11 refills | Status: DC

## 2016-12-05 MED ORDER — ISOSORB DINITRATE-HYDRALAZINE 20-37.5 MG PO TABS: 1.0000 | ORAL_TABLET | Freq: Three times a day (TID) | ORAL | 6 refills | Status: DC

## 2016-12-05 MED ORDER — CARVEDILOL 6.25 MG PO TABS: 9.3750 mg | ORAL_TABLET | Freq: Two times a day (BID) | ORAL | 6 refills | Status: DC

## 2016-12-05 MED ORDER — FUROSEMIDE 40 MG PO TABS: 40.0000 mg | ORAL_TABLET | Freq: Every day | ORAL | 6 refills | Status: DC

## 2016-12-05 NOTE — Progress Notes (Signed)
Paramedicine Encounter    Patient ID: Edward Mullen, male    DOB: Jan 30, 1956, 61 y.o.   MRN: [id number]    Patient Care Team: Shela Leff, MD as PCP - General  Patient Active Problem List   Diagnosis Date Noted  . Chronic systolic heart failure ([place]) 09/06/2016  . NSVT (nonsustained ventricular tachycardia) ([place]) 09/06/2016  . Non-ischemic cardiomyopathy ([place]) 08/27/2016  . Non-obstructive hypertrophic cardiomyopathy ([place]) 08/27/2016  . Mitral regurgitation 08/27/2016  . Polysubstance abuse   . Blurred vision, bilateral 09/20/2014  . Tobacco abuse 09/19/2014  . HTN (hypertension) 03/28/2011  . Claudication of both lower extremities ([place]) 03/28/2011  . Erectile dysfunction 03/28/2011    Current Outpatient Prescriptions:  .  aspirin 81 MG chewable tablet, Chew 1 tablet (81 mg total) by mouth daily., Disp: 90 tablet, Rfl: 3 .  atorvastatin (LIPITOR) 20 MG tablet, Take 1 tablet (20 mg total) by mouth daily at 6 PM., Disp: 90 tablet, Rfl: 3 .  carvedilol (COREG) 6.25 MG tablet, Take 1.5 tablets (9.375 mg total) by mouth 2 (two) times daily with a meal., Disp: 90 tablet, Rfl: 6 .  digoxin (LANOXIN) 0.125 MG tablet, Take 1 tablet (0.125 mg total) by mouth daily., Disp: 90 tablet, Rfl: 3 .  furosemide (LASIX) 40 MG tablet, Take 1 tablet (40 mg total) by mouth daily., Disp: 30 tablet, Rfl: 0 .  isosorbide-hydrALAZINE (BIDIL) 20-37.5 MG tablet, Take 1 tablet by mouth 3 (three) times daily., Disp: 90 tablet, Rfl: 3 .  spironolactone (ALDACTONE) 25 MG tablet, Take 1 tablet (25 mg total) by mouth daily., Disp: 90 tablet, Rfl: 3 .  sacubitril-valsartan (ENTRESTO) 49-51 MG, Take 1 tablet by mouth 2 (two) times daily., Disp: 60 tablet, Rfl: 11 No Known Allergies   Social History   Social History  . Marital status: Married    Spouse name: N/A  . Number of children: N/A  . 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 Smoking status: Current Every Day Smoker    Packs/day: 0.30    Years: 48.00    Types: Cigarettes  . Smokeless tobacco: Never Used     Comment: cutting back 2 per day  . Alcohol use 3.6 oz/week    6 Cans of beer per week  . Drug use: Yes    Types: Marijuana, Cocaine  . Sexual activity: Yes    Birth control/ protection: None   Other Topics Concern  . Not on file   Social History Narrative  . No narrative on file    Physical Exam  Musculoskeletal: He exhibits no edema.        Future Appointments Date Time Provider [place]  01/29/2017 11:20 AM [facility] None    ATF pt CAO x4 with no complaints.  [facility] revisit to fill pt's pill box, pt is still out of several medications due to transfer of pharmacies.  I was able to pick up three so far and called in the rest.    bidil until Monday am Lasix only in thurs and fri [facility] carvediol, fursemide, entresto, bidil  BP 112/76 (BP Location: Left Arm, Patient Position: Sitting, Cuff Size: Normal)   Pulse 72   Wt 193 lb (87.5 kg)   SpO2 98%   BMI 26.18 kg/m   Weight yesterday-191  Ligia Duguay, EMT Paramedic 12/05/2016    ACTION: Home visit completed

## 2016-12-10 ENCOUNTER — Telehealth (HOSPITAL_COMMUNITY): Payer: Self-pay | Admitting: Pharmacist

## 2016-12-10 ENCOUNTER — Other Ambulatory Visit (HOSPITAL_COMMUNITY): Payer: Self-pay

## 2016-12-10 NOTE — Progress Notes (Signed)
Paramedicine Encounter    Patient ID: Edward Mullen, male    DOB: [birth year], 61 y.o.   MRN: [id number]    Patient Care Team: Shela Leff, MD as PCP - General  Patient Active Problem List   Diagnosis Date Noted  . Chronic systolic heart failure ([place]) 09/06/2016  . NSVT (nonsustained ventricular tachycardia) ([place]) 09/06/2016  . Non-ischemic cardiomyopathy ([place]) 08/27/2016  . Non-obstructive hypertrophic cardiomyopathy ([place]) 08/27/2016  . Mitral regurgitation 08/27/2016  . Polysubstance abuse   . Blurred vision, bilateral 09/20/2014  . Tobacco abuse 09/19/2014  . HTN (hypertension) 03/28/2011  . Claudication of both lower extremities ([place]) 03/28/2011  . Erectile dysfunction 03/28/2011    Current Outpatient Prescriptions:  .  aspirin 81 MG chewable tablet, Chew 1 tablet (81 mg total) by mouth daily., Disp: 90 tablet, Rfl: 3 .  atorvastatin (LIPITOR) 20 MG tablet, Take 1 tablet (20 mg total) by mouth daily at 6 PM., Disp: 90 tablet, Rfl: 3 .  carvedilol (COREG) 6.25 MG tablet, Take 1.5 tablets (9.375 mg total) by mouth 2 (two) times daily with a meal., Disp: 90 tablet, Rfl: 6 .  digoxin (LANOXIN) 0.125 MG tablet, Take 1 tablet (0.125 mg total) by mouth daily., Disp: 90 tablet, Rfl: 3 .  furosemide (LASIX) 40 MG tablet, Take 1 tablet (40 mg total) by mouth daily., Disp: 30 tablet, Rfl: 6 .  isosorbide-hydrALAZINE (BIDIL) 20-37.5 MG tablet, Take 1 tablet by mouth 3 (three) times daily., Disp: 90 tablet, Rfl: 6 .  sacubitril-valsartan (ENTRESTO) 49-51 MG, Take 1 tablet by mouth 2 (two) times daily., Disp: 60 tablet, Rfl: 11 .  spironolactone (ALDACTONE) 25 MG tablet, Take 1 tablet (25 mg total) by mouth daily., Disp: 90 tablet, Rfl: 3 No Known Allergies   Social History   Social History  . Marital status: Married    Spouse name: N/A  . Number of children: N/A  . 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 Smoking status: Current Every Day Smoker    Packs/day: 0.30    Years: 48.00    Types: Cigarettes  . Smokeless tobacco: Never Used     Comment: cutting back 2 per day  . Alcohol use 3.6 oz/week    6 Cans of beer per week  . Drug use: Yes    Types: Marijuana, Cocaine  . Sexual activity: Yes    Birth control/ protection: None   Other Topics Concern  . Not on file   Social History Narrative  . No narrative on file    Physical Exam  Eyes: Pupils are equal, round, and reactive to light.  Pulmonary/Chest: No respiratory distress. He has no wheezes. He has no rales.  Abdominal: He exhibits no distension. There is no tenderness. There is no rebound and no guarding.  Musculoskeletal: He exhibits no edema.  Skin: Skin is warm and dry. He is not diaphoretic.        Future Appointments Date Time Provider [place]  01/29/2017 11:20 AM [facility] None    ATF pt CAO x4 c/o hip pain which is normal for pt the day after he has been doing a lot of walking. Pt also has pain around his ribs which he thinks could be due to how he is laying on the couch where he sleeps or due to him being  out of furosemide.  Pt has been out of furosemide for 4 days.  Pt denies sob and difficulty breathing.  Pt had increase in weight Monday morning of 198; today his weight is 192.  Pt stated that he did drink a lot of fluids on Sunday.  Pt has no other complaints today.  Pt denies chest pain, dizziness, and headaches.   Pt now has medicaid but he has no income. Pt is having difficulty affording the co-pay for his medications.  Pt has asked his family but there is no financial assistance available for him.  Daphne from the heart clinic assisted pt with his co-payment this month.  Pt has no issues with taking his medications.  He has also stayed away from foods high in sodium. rx bottles verified and pill box refilled.   BP 110/76 (BP  Location: Left Arm, Patient Position: Sitting, Cuff Size: Normal)   Pulse 77   Resp 16   Wt 192 lb (87.1 kg)   SpO2 98%   BMI 26.04 kg/m   Weight yesterday-198 Last visit weight-193    Craig Wisnewski, EMT Paramedic 12/10/2016    ACTION: Home visit completed Next visit planned for next wednesday

## 2016-12-10 NOTE — Telephone Encounter (Signed)
Entresto PA approved by [place] [employer] through 12/04/17.   Ruta Hinds. Velva Harman, PharmD, BCPS, CPP Clinical Pharmacist Pager: [phone] Phone: [phone] 12/10/2016 11:42 AM

## 2016-12-11 ENCOUNTER — Encounter (HOSPITAL_COMMUNITY): Payer: Self-pay

## 2016-12-11 DIAGNOSIS — Z736 Limitation of activities due to disability: Secondary | ICD-10-CM

## 2016-12-11 NOTE — Progress Notes (Signed)
[facility] Record Request 3/8/201- present received via mail 11/29/16. All available records from single visit in CHF clinic on 11/22/2016 faxed to provided # [phone] (11 pages total). Copy of request scanned into patient's electronic medical record.  Renee Pain, RN

## 2016-12-12 ENCOUNTER — Emergency Department (HOSPITAL_COMMUNITY): Payer: Medicaid Other

## 2016-12-12 ENCOUNTER — Other Ambulatory Visit: Payer: Self-pay

## 2016-12-12 ENCOUNTER — Encounter (HOSPITAL_COMMUNITY): Payer: Self-pay | Admitting: Emergency Medicine

## 2016-12-12 ENCOUNTER — Observation Stay (HOSPITAL_COMMUNITY)
Admission: EM | Admit: 2016-12-12 | Discharge: 2016-12-14 | Disposition: A | Discharge status: Home / Self Care | Payer: Medicaid Other | Attending: Internal Medicine | Admitting: Internal Medicine

## 2016-12-12 DIAGNOSIS — M1990 Unspecified osteoarthritis, unspecified site: Secondary | ICD-10-CM | POA: Insufficient documentation

## 2016-12-12 DIAGNOSIS — F1290 Cannabis use, unspecified, uncomplicated: Secondary | ICD-10-CM | POA: Diagnosis not present

## 2016-12-12 DIAGNOSIS — N183 Chronic kidney disease, stage 3 unspecified: Secondary | ICD-10-CM | POA: Diagnosis present

## 2016-12-12 DIAGNOSIS — I422 Other hypertrophic cardiomyopathy: Secondary | ICD-10-CM

## 2016-12-12 DIAGNOSIS — I447 Left bundle-branch block, unspecified: Secondary | ICD-10-CM | POA: Diagnosis not present

## 2016-12-12 DIAGNOSIS — Z7982 Long term (current) use of aspirin: Secondary | ICD-10-CM | POA: Insufficient documentation

## 2016-12-12 DIAGNOSIS — Z7289 Other problems related to lifestyle: Secondary | ICD-10-CM | POA: Insufficient documentation

## 2016-12-12 DIAGNOSIS — F17210 Nicotine dependence, cigarettes, uncomplicated: Secondary | ICD-10-CM | POA: Diagnosis not present

## 2016-12-12 DIAGNOSIS — I472 Ventricular tachycardia: Secondary | ICD-10-CM | POA: Diagnosis not present

## 2016-12-12 DIAGNOSIS — D649 Anemia, unspecified: Secondary | ICD-10-CM | POA: Diagnosis not present

## 2016-12-12 DIAGNOSIS — F1410 Cocaine abuse, uncomplicated: Secondary | ICD-10-CM | POA: Diagnosis not present

## 2016-12-12 DIAGNOSIS — F1910 Other psychoactive substance abuse, uncomplicated: Secondary | ICD-10-CM | POA: Diagnosis present

## 2016-12-12 DIAGNOSIS — I5042 Chronic combined systolic (congestive) and diastolic (congestive) heart failure: Secondary | ICD-10-CM | POA: Diagnosis not present

## 2016-12-12 DIAGNOSIS — I428 Other cardiomyopathies: Secondary | ICD-10-CM

## 2016-12-12 DIAGNOSIS — E876 Hypokalemia: Secondary | ICD-10-CM | POA: Diagnosis not present

## 2016-12-12 DIAGNOSIS — N179 Acute kidney failure, unspecified: Secondary | ICD-10-CM | POA: Diagnosis not present

## 2016-12-12 DIAGNOSIS — R55 Syncope and collapse: Secondary | ICD-10-CM | POA: Diagnosis not present

## 2016-12-12 DIAGNOSIS — R7303 Prediabetes: Secondary | ICD-10-CM | POA: Diagnosis not present

## 2016-12-12 DIAGNOSIS — I130 Hypertensive heart and chronic kidney disease with heart failure and stage 1 through stage 4 chronic kidney disease, or unspecified chronic kidney disease: Secondary | ICD-10-CM | POA: Diagnosis not present

## 2016-12-12 DIAGNOSIS — I10 Essential (primary) hypertension: Secondary | ICD-10-CM | POA: Diagnosis present

## 2016-12-12 LAB — I-STAT CHEM 8, ED
BUN: 23 mg/dL — ABNORMAL HIGH (ref 6–20)
Calcium, Ion: 1.1 mmol/L — ABNORMAL LOW (ref 1.15–1.40)
Chloride: 107 mmol/L (ref 101–111)
Creatinine, Ser: 1.9 mg/dL — ABNORMAL HIGH (ref 0.61–1.24)
Glucose, Bld: 107 mg/dL — ABNORMAL HIGH (ref 65–99)
HCT: 38 % — ABNORMAL LOW (ref 39.0–52.0)
Hemoglobin: 12.9 g/dL — ABNORMAL LOW (ref 13.0–17.0)
Potassium: 3.6 mmol/L (ref 3.5–5.1)
Sodium: 143 mmol/L (ref 135–145)
TCO2: 25 mmol/L (ref 0–100)

## 2016-12-12 LAB — CBC WITH DIFFERENTIAL/PLATELET
Basophils Absolute: 0 10*3/uL (ref 0.0–0.1)
Basophils Relative: 0 %
Eosinophils Absolute: 0.1 10*3/uL (ref 0.0–0.7)
Eosinophils Relative: 2 %
HCT: 38.7 % — ABNORMAL LOW (ref 39.0–52.0)
Hemoglobin: 12.4 g/dL — ABNORMAL LOW (ref 13.0–17.0)
Lymphocytes Relative: 19 %
Lymphs Abs: 1.4 10*3/uL (ref 0.7–4.0)
MCH: 26.9 pg (ref 26.0–34.0)
MCHC: 32 g/dL (ref 30.0–36.0)
MCV: 83.9 fL (ref 78.0–100.0)
Monocytes Absolute: 0.8 10*3/uL (ref 0.1–1.0)
Monocytes Relative: 11 %
Neutro Abs: 5.2 10*3/uL (ref 1.7–7.7)
Neutrophils Relative %: 68 %
Platelets: 191 10*3/uL (ref 150–400)
RBC: 4.61 MIL/uL (ref 4.22–5.81)
RDW: 16.9 % — ABNORMAL HIGH (ref 11.5–15.5)
WBC: 7.6 10*3/uL (ref 4.0–10.5)

## 2016-12-12 LAB — I-STAT TROPONIN, ED: Troponin i, poc: 0.02 ng/mL (ref 0.00–0.08)

## 2016-12-12 LAB — BASIC METABOLIC PANEL
Anion gap: 12 (ref 5–15)
BUN: 19 mg/dL (ref 6–20)
CO2: 22 mmol/L (ref 22–32)
Calcium: 8.7 mg/dL — ABNORMAL LOW (ref 8.9–10.3)
Chloride: 105 mmol/L (ref 101–111)
Creatinine, Ser: 1.91 mg/dL — ABNORMAL HIGH (ref 0.61–1.24)
GFR calc Af Amer: 42 mL/min — ABNORMAL LOW (ref >60)
GFR calc non Af Amer: 36 mL/min — ABNORMAL LOW (ref >60)
Glucose, Bld: 115 mg/dL — ABNORMAL HIGH (ref 65–99)
Potassium: 3.4 mmol/L — ABNORMAL LOW (ref 3.5–5.1)
Sodium: 139 mmol/L (ref 135–145)

## 2016-12-12 NOTE — ED Triage Notes (Signed)
Pt was reported to have been passed out on his porch and was found by a neighbor. Neighbor reported that the pt had some twitching of his arms and legs but no seizure like activity, neighbor said it appeared that the pt shivered. EMS reports the pt was hypotensive for them with thready pulses with a BP of 70/50. EMS reports 12 lead shows a LBBB. EMS reports ETOH on board and also Marijuana. EMS reports pt denies CP and CBG. EMS reports clear lung sounds bilaterally. EMS also reports the pt just got diagnosed with CHF in January.

## 2016-12-12 NOTE — ED Provider Notes (Signed)
[facility] DEPT Provider Note   CSN: [id number] Arrival date & time: 12/12/16  2145     History   Chief Complaint Chief Complaint  Patient presents with  . Loss of Consciousness    HPI Edward Mullen is a 61 y.o. male.  61 yo M with a chief complaint of a syncopal event. Patient states that he was drinking vodka and smoking marijuana in his house. He suddenly felt very warm and sweaty all over and then woke up on the couch. Dialed 911. Denies any chest pain or shortness of breath. Has a history of CHF with an EF of 15-20%. Denies any lower extremity edema. Denies noncompliance with his medications.   The history is provided by the patient.  Loss of Consciousness   This is a new problem. The current episode started less than 1 hour ago. The problem occurs rarely. The problem has been resolved. He lost consciousness for a period of less than one minute. The problem is associated with normal activity. Pertinent negatives include abdominal pain, chest pain, confusion, congestion, fever, headaches, palpitations and vomiting. He has tried nothing for the symptoms. The treatment provided no relief.    Past Medical History:  Diagnosis Date  . Arthritis    "feels like it in my legs" (09/20/2014)  . Borderline type 2 diabetes mellitus   . Hypertension     Patient Active Problem List   Diagnosis Date Noted  . Chronic systolic heart failure ([place]) 09/06/2016  . NSVT (nonsustained ventricular tachycardia) ([place]) 09/06/2016  . Non-ischemic cardiomyopathy ([place]) 08/27/2016  . Non-obstructive hypertrophic cardiomyopathy ([place]) 08/27/2016  . Mitral regurgitation 08/27/2016  . Polysubstance abuse   . Blurred vision, bilateral 09/20/2014  . Tobacco abuse 09/19/2014  . HTN (hypertension) 03/28/2011  . Claudication of both lower extremities ([place]) 03/28/2011  . Erectile dysfunction 03/28/2011    Past Surgical History:  Procedure Laterality Date  . CARDIAC CATHETERIZATION N/A 08/27/2016   Procedure: Right/Left Heart Cath and Coronary Angiography;  Surgeon: Larey Dresser, MD;  Location: [facility];  Service: Cardiovascular;  Laterality: N/A;  . CYSTECTOMY Right    "back of my shoulder"  . TONSILLECTOMY         Home Medications    Prior to Admission medications   Medication Sig Start Date End Date Taking? Authorizing Provider  aspirin 81 MG chewable tablet Chew 1 tablet (81 mg total) by mouth daily. 09/23/16  Yes Shela Leff, MD  atorvastatin (LIPITOR) 20 MG tablet Take 1 tablet (20 mg total) by mouth daily at 6 PM. 12/05/16   Larey Dresser, MD  carvedilol (COREG) 6.25 MG tablet Take 1.5 tablets (9.375 mg total) by mouth 2 (two) times daily with a meal. 12/05/16   Larey Dresser, MD  digoxin (LANOXIN) 0.125 MG tablet Take 1 tablet (0.125 mg total) by mouth daily. 12/05/16   Larey Dresser, MD  furosemide (LASIX) 40 MG tablet Take 1 tablet (40 mg total) by mouth daily. 12/05/16   Larey Dresser, MD  isosorbide-hydrALAZINE (BIDIL) 20-37.5 MG tablet Take 1 tablet by mouth 3 (three) times daily. 12/05/16   Larey Dresser, MD  sacubitril-valsartan (ENTRESTO) 49-51 MG Take 1 tablet by mouth 2 (two) times daily. 12/05/16   Larey Dresser, MD  spironolactone (ALDACTONE) 25 MG tablet Take 1 tablet (25 mg total) by mouth daily. 12/05/16   Larey Dresser, MD    Family History Family History  Problem Relation Age of Onset  . Stroke Mother  53  . Heart disease Father 57    Social History Social History  Substance Use Topics  . Smoking status: Current Every Day Smoker    Packs/day: 0.30    Years: 48.00    Types: Cigarettes  . Smokeless tobacco: Never Used     Comment: cutting back 2 per day  . Alcohol use 3.6 oz/week    6 Cans of beer per week     Allergies   Patient has no known allergies.   Review of Systems Review of Systems  Constitutional: Negative for chills and fever.  HENT: Negative for congestion and facial swelling.   Eyes: Negative for  discharge and visual disturbance.  Respiratory: Negative for shortness of breath.   Cardiovascular: Positive for syncope. Negative for chest pain and palpitations.  Gastrointestinal: Negative for abdominal pain, diarrhea and vomiting.  Musculoskeletal: Negative for arthralgias and myalgias.  Skin: Negative for color change and rash.  Neurological: Positive for syncope. Negative for tremors and headaches.  Psychiatric/Behavioral: Negative for confusion and dysphoric mood.     Physical Exam Updated Vital Signs BP (!) 126/98 (BP Location: Right Arm)   Pulse 86   Temp 98.2 F (36.8 C) (Oral)   Resp (!) 22   SpO2 99%   Physical Exam  Constitutional: He is oriented to person, place, and time. He appears well-developed and well-nourished.  HENT:  Head: Normocephalic and atraumatic.  Eyes: EOM are normal. Pupils are equal, round, and reactive to light.  Neck: Normal range of motion. Neck supple. No JVD present.  Cardiovascular: Normal rate and regular rhythm.  Exam reveals no gallop and no friction rub.   No murmur heard. Pulmonary/Chest: No respiratory distress. He has no wheezes.  Abdominal: He exhibits no distension and no mass. There is no tenderness. There is no rebound and no guarding.  Musculoskeletal: Normal range of motion.  Neurological: He is alert and oriented to person, place, and time.  Skin: No rash noted. No pallor.  Psychiatric: He has a normal mood and affect. His behavior is normal.  Nursing note and vitals reviewed.    ED Treatments / Results  Labs (all labs ordered are listed, but only abnormal results are displayed) Labs Reviewed  CBC WITH DIFFERENTIAL/PLATELET - Abnormal; Notable for the following:       Result Value   Hemoglobin 12.4 (*)    HCT 38.7 (*)    RDW 16.9 (*)    All other components within normal limits  BASIC METABOLIC PANEL - Abnormal; Notable for the following:    Potassium 3.4 (*)    Glucose, Bld 115 (*)    Creatinine, Ser 1.91 (*)     Calcium 8.7 (*)    GFR calc non Af Amer 36 (*)    GFR calc Af Amer 42 (*)    All other components within normal limits  I-STAT CHEM 8, ED - Abnormal; Notable for the following:    BUN 23 (*)    Creatinine, Ser 1.90 (*)    Glucose, Bld 107 (*)    Calcium, Ion 1.10 (*)    Hemoglobin 12.9 (*)    HCT 38.0 (*)    All other components within normal limits  I-STAT TROPOININ, ED    EKG  EKG Interpretation  Date/Time:  Thursday Dec 12 2016 22:17:34 EDT Ventricular Rate:  78 PR Interval:    QRS Duration: 130 QT Interval:  405 QTC Calculation: 462 R Axis:   -51 Text Interpretation:  Sinus rhythm Left bundle branch  block No significant change since last tracing Confirmed by Merlyn Bollen MD, DANIEL [phone]) on 12/12/2016 11:01:57 PM       Radiology Dg Chest Port 1 View  Result Date: 12/12/2016 CLINICAL DATA:  Syncope at home tonight.  Hypotension. EXAM: PORTABLE CHEST 1 VIEW COMPARISON:  08/23/2016 FINDINGS: A single AP portable view of the chest demonstrates no focal airspace consolidation or alveolar edema. The lungs are grossly clear. There is no large effusion or pneumothorax. There is unchanged cardiomegaly. Cardiac and mediastinal contours are otherwise unremarkable. IMPRESSION: Unchanged cardiomegaly. No consolidation or effusion. Normal vasculature. Electronically Signed   By: Andreas Newport M.D.   On: 12/12/2016 22:16    Procedures Procedures (including critical care time)  Medications Ordered in ED Medications - No data to display   Initial Impression / Assessment and Plan / ED Course  I have reviewed the triage vital signs and the nursing notes.  Pertinent labs & imaging results that were available during my care of the patient were reviewed by me and considered in my medical decision making (see chart for details).     61 yo M with a chief complaint of a syncopal event. The patient's EF final this presentation was concerning for an arrhythmia. We'll obtain labs chest x-ray  place in obs.   The patients results and plan were reviewed and discussed.   Any x-rays performed were independently reviewed by myself.   Differential diagnosis were considered with the presenting HPI.  Medications - No data to display  Vitals:   12/12/16 2204 12/12/16 2219  BP: (!) 89/51 (!) 126/98  Pulse: 78 86  Resp: (!) 22 (!) 22  Temp: 98.2 F (36.8 C)   TempSrc: Oral   SpO2: 100% 99%    Final diagnoses:  Syncope and collapse    Admission/ observation were discussed with the admitting physician, patient and/or family and they are comfortable with the plan.    Final Clinical Impressions(s) / ED Diagnoses   Final diagnoses:  Syncope and collapse    New Prescriptions New Prescriptions   No medications on file     Deno Etienne, DO 12/12/16 2317

## 2016-12-12 NOTE — ED Notes (Signed)
The pt reports that he has been drinking alcohol and every time he drinks alcohol he passes out  No distress

## 2016-12-13 ENCOUNTER — Observation Stay (HOSPITAL_COMMUNITY): Payer: Medicaid Other

## 2016-12-13 ENCOUNTER — Encounter (HOSPITAL_COMMUNITY): Payer: Self-pay | Admitting: Internal Medicine

## 2016-12-13 DIAGNOSIS — F1910 Other psychoactive substance abuse, uncomplicated: Secondary | ICD-10-CM

## 2016-12-13 DIAGNOSIS — I472 Ventricular tachycardia: Secondary | ICD-10-CM

## 2016-12-13 DIAGNOSIS — I5022 Chronic systolic (congestive) heart failure: Secondary | ICD-10-CM

## 2016-12-13 DIAGNOSIS — N183 Chronic kidney disease, stage 3 unspecified: Secondary | ICD-10-CM | POA: Diagnosis present

## 2016-12-13 DIAGNOSIS — I428 Other cardiomyopathies: Secondary | ICD-10-CM | POA: Diagnosis not present

## 2016-12-13 DIAGNOSIS — R55 Syncope and collapse: Principal | ICD-10-CM

## 2016-12-13 LAB — URINALYSIS, ROUTINE W REFLEX MICROSCOPIC
Bilirubin Urine: NEGATIVE
Glucose, UA: NEGATIVE mg/dL
Hgb urine dipstick: NEGATIVE
Ketones, ur: 5 mg/dL — AB
Leukocytes, UA: NEGATIVE
Nitrite: NEGATIVE
Protein, ur: 100 mg/dL — AB
Specific Gravity, Urine: 1.02 (ref 1.005–1.030)
pH: 5 (ref 5.0–8.0)

## 2016-12-13 LAB — CBC
HCT: 39.2 % (ref 39.0–52.0)
HCT: 36.2 % — ABNORMAL LOW (ref 39.0–52.0)
Hemoglobin: 12.7 g/dL — ABNORMAL LOW (ref 13.0–17.0)
Hemoglobin: 11.7 g/dL — ABNORMAL LOW (ref 13.0–17.0)
MCH: 26.9 pg (ref 26.0–34.0)
MCH: 26.8 pg (ref 26.0–34.0)
MCHC: 32.4 g/dL (ref 30.0–36.0)
MCHC: 32.3 g/dL (ref 30.0–36.0)
MCV: 83.1 fL (ref 78.0–100.0)
MCV: 82.8 fL (ref 78.0–100.0)
Platelets: 200 10*3/uL (ref 150–400)
Platelets: 166 10*3/uL (ref 150–400)
RBC: 4.72 MIL/uL (ref 4.22–5.81)
RBC: 4.37 MIL/uL (ref 4.22–5.81)
RDW: 16.7 % — ABNORMAL HIGH (ref 11.5–15.5)
RDW: 16.6 % — ABNORMAL HIGH (ref 11.5–15.5)
WBC: 11.2 10*3/uL — ABNORMAL HIGH (ref 4.0–10.5)
WBC: 9.9 10*3/uL (ref 4.0–10.5)

## 2016-12-13 LAB — MAGNESIUM: Magnesium: 1.9 mg/dL (ref 1.7–2.4)

## 2016-12-13 LAB — BASIC METABOLIC PANEL
Anion gap: 10 (ref 5–15)
Anion gap: 8 (ref 5–15)
BUN: 20 mg/dL (ref 6–20)
BUN: 20 mg/dL (ref 6–20)
CO2: 24 mmol/L (ref 22–32)
CO2: 24 mmol/L (ref 22–32)
Calcium: 8.5 mg/dL — ABNORMAL LOW (ref 8.9–10.3)
Calcium: 8.5 mg/dL — ABNORMAL LOW (ref 8.9–10.3)
Chloride: 105 mmol/L (ref 101–111)
Chloride: 105 mmol/L (ref 101–111)
Creatinine, Ser: 1.69 mg/dL — ABNORMAL HIGH (ref 0.61–1.24)
Creatinine, Ser: 1.6 mg/dL — ABNORMAL HIGH (ref 0.61–1.24)
GFR calc Af Amer: 48 mL/min — ABNORMAL LOW (ref >60)
GFR calc Af Amer: 52 mL/min — ABNORMAL LOW (ref >60)
GFR calc non Af Amer: 42 mL/min — ABNORMAL LOW (ref >60)
GFR calc non Af Amer: 45 mL/min — ABNORMAL LOW (ref >60)
Glucose, Bld: 106 mg/dL — ABNORMAL HIGH (ref 65–99)
Glucose, Bld: 88 mg/dL (ref 65–99)
Potassium: 3.7 mmol/L (ref 3.5–5.1)
Potassium: 3.5 mmol/L (ref 3.5–5.1)
Sodium: 139 mmol/L (ref 135–145)
Sodium: 137 mmol/L (ref 135–145)

## 2016-12-13 LAB — RAPID URINE DRUG SCREEN, HOSP PERFORMED
Amphetamines: NOT DETECTED
Barbiturates: NOT DETECTED
Benzodiazepines: NOT DETECTED
Cocaine: POSITIVE — AB
Opiates: NOT DETECTED
Tetrahydrocannabinol: POSITIVE — AB

## 2016-12-13 LAB — DIGOXIN LEVEL: Digoxin Level: 0.5 ng/mL — ABNORMAL LOW (ref 0.8–2.0)

## 2016-12-13 LAB — TROPONIN I
Troponin I: 0.03 ng/mL (ref <0.03)
Troponin I: 0.04 ng/mL (ref <0.03)
Troponin I: 0.04 ng/mL (ref <0.03)

## 2016-12-13 LAB — CREATININE, SERUM
Creatinine, Ser: 1.85 mg/dL — ABNORMAL HIGH (ref 0.61–1.24)
GFR calc Af Amer: 43 mL/min — ABNORMAL LOW (ref >60)
GFR calc non Af Amer: 37 mL/min — ABNORMAL LOW (ref >60)

## 2016-12-13 LAB — TSH: TSH: 0.616 u[IU]/mL (ref 0.350–4.500)

## 2016-12-13 MED ORDER — DIGOXIN 125 MCG PO TABS
0.1250 mg | ORAL_TABLET | Freq: Every day | ORAL | Status: DC
  Administered 2016-12-13 – 2016-12-14 (×2): 0.125 mg via ORAL
  Filled 2016-12-13: qty 1

## 2016-12-13 MED ORDER — ISOSORB DINITRATE-HYDRALAZINE 20-37.5 MG PO TABS
1.0000 | ORAL_TABLET | Freq: Three times a day (TID) | ORAL | Status: DC
  Administered 2016-12-13 – 2016-12-14 (×4): 1 via ORAL
  Filled 2016-12-13 (×4): qty 1

## 2016-12-13 MED ORDER — FUROSEMIDE 20 MG PO TABS
40.0000 mg | ORAL_TABLET | Freq: Every day | ORAL | Status: DC
  Administered 2016-12-13: 40 mg via ORAL
  Filled 2016-12-13: qty 2

## 2016-12-13 MED ORDER — ENOXAPARIN SODIUM 40 MG/0.4ML ~~LOC~~ SOLN
40.0000 mg | SUBCUTANEOUS | Status: DC
  Administered 2016-12-13 – 2016-12-14 (×2): 40 mg via SUBCUTANEOUS
  Filled 2016-12-13 (×2): qty 0.4

## 2016-12-13 MED ORDER — SACUBITRIL-VALSARTAN 49-51 MG PO TABS
1.0000 | ORAL_TABLET | Freq: Two times a day (BID) | ORAL | Status: DC
  Administered 2016-12-13 – 2016-12-14 (×3): 1 via ORAL
  Filled 2016-12-13 (×3): qty 1

## 2016-12-13 MED ORDER — FUROSEMIDE 40 MG PO TABS: 40.0000 mg | ORAL_TABLET | ORAL | Status: DC

## 2016-12-13 MED ORDER — ASPIRIN 81 MG PO CHEW
81.0000 mg | CHEWABLE_TABLET | Freq: Every day | ORAL | Status: DC
  Administered 2016-12-13 – 2016-12-14 (×2): 81 mg via ORAL
  Filled 2016-12-13 (×2): qty 1

## 2016-12-13 MED ORDER — ACETAMINOPHEN 650 MG RE SUPP: 650.0000 mg | Freq: Four times a day (QID) | RECTAL | Status: DC | PRN

## 2016-12-13 MED ORDER — MAGNESIUM SULFATE 2 GM/50ML IV SOLN
2.0000 g | Freq: Once | INTRAVENOUS | Status: AC
  Administered 2016-12-13: 2 g via INTRAVENOUS
  Filled 2016-12-13: qty 50

## 2016-12-13 MED ORDER — ACETAMINOPHEN 325 MG PO TABS: 650.0000 mg | ORAL_TABLET | Freq: Four times a day (QID) | ORAL | Status: DC | PRN

## 2016-12-13 MED ORDER — ATORVASTATIN CALCIUM 20 MG PO TABS
20.0000 mg | ORAL_TABLET | Freq: Every day | ORAL | Status: DC
  Administered 2016-12-13: 20 mg via ORAL
  Filled 2016-12-13: qty 1

## 2016-12-13 MED ORDER — SPIRONOLACTONE 25 MG PO TABS
25.0000 mg | ORAL_TABLET | Freq: Every day | ORAL | Status: DC
  Administered 2016-12-13 – 2016-12-14 (×2): 25 mg via ORAL
  Filled 2016-12-13 (×2): qty 1

## 2016-12-13 NOTE — Procedures (Signed)
ELECTROENCEPHALOGRAM REPORT  Date of Study: 12/13/2016  Patient's Name: Edward Mullen MRN: [id number] Date of Birth: [birth year]  Referring Provider: Gean Birchwood, MD  Clinical History: 61 year old man with nonischemic cardiomyopathy presents with episode of loss of consciousness while using cocaine, marijuana and alcohol.  Medications: acetaminophen (TYLENOL) tablet 650 mg  aspirin chewable tablet 81 mg  atorvastatin (LIPITOR) tablet 20 mg  digoxin (LANOXIN) tablet 0.125 mg  enoxaparin (LOVENOX) injection 40 mg  furosemide (LASIX) tablet 40 mg  isosorbide-hydrALAZINE (BIDIL) 20-37.5 MG sacubitril-valsartan (ENTRESTO) 49-51 mg per tabletspironolactone (ALDACTONE) tablet 25 mg  Technical Summary: A multichannel digital EEG recording measured by the international 10-20 system with electrodes applied with paste and impedances below 5000 ohms performed in our laboratory with EKG monitoring in an awake and drowsy patient.  Hyperventilation and photic stimulation were not performed.  The digital EEG was referentially recorded, reformatted, and digitally filtered in a variety of bipolar and referential montages for optimal display.    Description: The patient is awake and drowsy during the recording.  During maximal wakefulness, there is a symmetric, medium voltage 10 Hz posterior dominant rhythm that attenuates with eye opening.  The record is symmetric.  During drowsiness, there is an increase in theta slowing of the background. Stage 2 sleep was not seen.  There were no epileptiform discharges or electrographic seizures seen.    EKG lead was unremarkable.  Impression: This awake and drowsy EEG is normal.    Clinical Correlation: A normal EEG does not exclude a clinical diagnosis of epilepsy.  If further clinical questions remain, prolonged EEG may be helpful.  Clinical correlation is advised.   Metta Clines, DO

## 2016-12-13 NOTE — ED Notes (Signed)
Report called to   3 e

## 2016-12-13 NOTE — Progress Notes (Addendum)
61 year old male with history of nonischemic cardiomyopathy-EF around 20%-ongoing alcohol, cocaine and marijuana use-admitted with a syncopal episode. Per patient, he was at a friend's house-and was drinking alcohol, and also had used cocaine-he subsequently had a prodrome of sweating, flushing and lightheadedness, and he subsequently syncopized. He is not sure if anyone witnessed this syncopal spell. Denies any tongue bite or urinary incontinence to me. Subsequently admitted to the [facility] hospitalist service. Has had a few episodes of NSVT. Although, it sounds like a vasovagal syncope-given EF of 20%-and ongoing NSVT on telemetry-will have cardiology evaluate the patient. Unfortunately-he continues to use cocaine and alcohol. Doubt seizures-CT head and EEG negative. No prior history of head trauma, brain surgery febrile seizures in the past.  No charge note-admitted earlier this am by Dr Hal Hope

## 2016-12-13 NOTE — Progress Notes (Signed)
Pt arrived at the unit at [facility]. Alert and oriented X4. Able to verbalize needs. No distress or discomfort noted. Pt just left the unit for CT Scan of head

## 2016-12-13 NOTE — H&P (Addendum)
History and Physical    [name]:[id number] DOB: 01/27/1956 DOA: 12/12/2016  PCP: Shela Leff, MD  Patient coming from: Home.  Chief Complaint: Loss of consciousness.  HPI: Edward Mullen is a 61 y.o. male with history of nonischemic cardiomyopathy diagnosed in January of this year during which patient had cardiac cath and 2-D echo at that time showed EF of 20-25% was brought to the ER after patient had a brief episode of loss of consciousness. Patient states he was at his friend's house when patient briefly lost consciousness while watching TV. Patient was having cocaine marijuana and alcohol while the episode happened. Patient does not remember how long the episode lasted but did have incontinence of urine. He remembers the EMS waking him up. Denies any confusion chest pain shortness of breath palpitation prior or after the episode. Denies any focal deficits.  ED Course: In the ER chest x-ray was unremarkable. EKG was showing normal sinus rhythm with nonspecific intraventricular conduction delay with QTC of 478 ms. On exam patient appears nonfocal. Patient is being admitted for further management of syncope. Patient states he has been compliant with his medications.  Review of Systems: As per HPI, rest all negative.   Past Medical History:  Diagnosis Date  . Arthritis    "feels like it in my legs" (09/20/2014)  . Borderline type 2 diabetes mellitus   . Hypertension     Past Surgical History:  Procedure Laterality Date  . CARDIAC CATHETERIZATION N/A 08/27/2016   Procedure: Right/Left Heart Cath and Coronary Angiography;  Surgeon: Larey Dresser, MD;  Location: [facility];  Service: Cardiovascular;  Laterality: N/A;  . CYSTECTOMY Right    "back of my shoulder"  . TONSILLECTOMY       reports that he has been smoking Cigarettes.  He has a 14.40 pack-year smoking history. He has never used smokeless tobacco. He reports that he drinks about 3.6 oz of alcohol  per week . He reports that he uses drugs, including Marijuana and Cocaine.  No Known Allergies  Family History  Problem Relation Age of Onset  . Stroke Mother 42  . Heart disease Father 25    Prior to Admission medications   Medication Sig Start Date End Date Taking? Authorizing Provider  aspirin 81 MG chewable tablet Chew 1 tablet (81 mg total) by mouth daily. 09/23/16  Yes Shela Leff, MD  atorvastatin (LIPITOR) 20 MG tablet Take 1 tablet (20 mg total) by mouth daily at 6 PM. 12/05/16   Larey Dresser, MD  carvedilol (COREG) 6.25 MG tablet Take 1.5 tablets (9.375 mg total) by mouth 2 (two) times daily with a meal. 12/05/16   Larey Dresser, MD  digoxin (LANOXIN) 0.125 MG tablet Take 1 tablet (0.125 mg total) by mouth daily. 12/05/16   Larey Dresser, MD  furosemide (LASIX) 40 MG tablet Take 1 tablet (40 mg total) by mouth daily. 12/05/16   Larey Dresser, MD  isosorbide-hydrALAZINE (BIDIL) 20-37.5 MG tablet Take 1 tablet by mouth 3 (three) times daily. 12/05/16   Larey Dresser, MD  sacubitril-valsartan (ENTRESTO) 49-51 MG Take 1 tablet by mouth 2 (two) times daily. 12/05/16   Larey Dresser, MD  spironolactone (ALDACTONE) 25 MG tablet Take 1 tablet (25 mg total) by mouth daily. 12/05/16   Larey Dresser, MD    Physical Exam: Vitals:   12/12/16 2330 12/13/16 0000 12/13/16 0030 12/13/16 0033  BP: 109/72 (!) 80/49 100/72 100/72  Pulse:  73 81 83 85  Resp: 19 19 17  (!) 22  Temp:      TempSrc:      SpO2: 100% 100% 100% 99%      Constitutional: Moderately built and nourished. Vitals:   12/12/16 2330 12/13/16 0000 12/13/16 0030 12/13/16 0033  BP: 109/72 (!) 80/49 100/72 100/72  Pulse: 73 81 83 85  Resp: 19 19 17  (!) 22  Temp:      TempSrc:      SpO2: 100% 100% 100% 99%   Eyes: Anicteric no pallor. ENMT: No discharge from the ears eyes nose or mouth. Neck: No mass felt. No JVD appreciated.  Respiratory: No rhonchi or crepitations. Cardiovascular: S1 and S2  heard. Abdomen: Soft nontender bowel sounds present. No guarding or rigidity. Musculoskeletal: No edema. No joint effusion. Skin: No rash. Skin appears warm. Neurologic: Alert awake oriented to time place and person. Moves all extremities. Psychiatric: Appears normal. Normal affect.   Labs on Admission: I have personally reviewed following labs and imaging studies  CBC:  Recent Labs Lab 12/12/16 2207 12/12/16 2218  WBC 7.6  --   NEUTROABS 5.2  --   HGB 12.4* 12.9*  HCT 38.7* 38.0*  MCV 83.9  --   PLT 191  --    Basic Metabolic Panel:  Recent Labs Lab 12/12/16 2207 12/12/16 2218  NA 139 143  K 3.4* 3.6  CL 105 107  CO2 22  --   GLUCOSE 115* 107*  BUN 19 23*  CREATININE 1.91* 1.90*  CALCIUM 8.7*  --    GFR: Estimated Creatinine Clearance: 44.2 mL/min (A) (by C-G formula based on SCr of 1.9 mg/dL (H)). Liver Function Tests: No results for input(s): AST, ALT, ALKPHOS, BILITOT, PROT, ALBUMIN in the last 168 hours. No results for input(s): LIPASE, AMYLASE in the last 168 hours. No results for input(s): AMMONIA in the last 168 hours. Coagulation Profile: No results for input(s): INR, PROTIME in the last 168 hours. Cardiac Enzymes: No results for input(s): CKTOTAL, CKMB, CKMBINDEX, TROPONINI in the last 168 hours. BNP (last 3 results) No results for input(s): PROBNP in the last 8760 hours. HbA1C: No results for input(s): HGBA1C in the last 72 hours. CBG: No results for input(s): GLUCAP in the last 168 hours. Lipid Profile: No results for input(s): CHOL, HDL, LDLCALC, TRIG, CHOLHDL, LDLDIRECT in the last 72 hours. Thyroid Function Tests: No results for input(s): TSH, T4TOTAL, FREET4, T3FREE, THYROIDAB in the last 72 hours. Anemia Panel: No results for input(s): VITAMINB12, FOLATE, FERRITIN, TIBC, IRON, RETICCTPCT in the last 72 hours. Urine analysis:    Component Value Date/Time   COLORURINE STRAW (A) 10/08/2016 1100   APPEARANCEUR CLEAR 10/08/2016 1100    LABSPEC 1.008 10/08/2016 1100   PHURINE 6.0 10/08/2016 1100   GLUCOSEU NEGATIVE 10/08/2016 1100   HGBUR NEGATIVE 10/08/2016 1100   BILIRUBINUR NEGATIVE 10/08/2016 1100   KETONESUR NEGATIVE 10/08/2016 1100   PROTEINUR NEGATIVE 10/08/2016 1100   UROBILINOGEN 0.2 12/31/2010 1003   NITRITE NEGATIVE 10/08/2016 1100   LEUKOCYTESUR NEGATIVE 10/08/2016 1100   Sepsis Labs: @LABRCNTIP (procalcitonin:4,lacticidven:4) )No results found for this or any previous visit (from the past 240 hour(s)).   Radiological Exams on Admission: Dg Chest Port 1 View  Result Date: 12/12/2016 CLINICAL DATA:  Syncope at home tonight.  Hypotension. EXAM: PORTABLE CHEST 1 VIEW COMPARISON:  08/23/2016 FINDINGS: A single AP portable view of the chest demonstrates no focal airspace consolidation or alveolar edema. The lungs are grossly clear. There is no large effusion  or pneumothorax. There is unchanged cardiomegaly. Cardiac and mediastinal contours are otherwise unremarkable. IMPRESSION: Unchanged cardiomegaly. No consolidation or effusion. Normal vasculature. Electronically Signed   By: Andreas Newport M.D.   On: 12/12/2016 22:16    EKG: Independently reviewed. Normal sinus rhythm with nonspecific intraventricular conduction delay.  Assessment/Plan Principal Problem:   Syncope Active Problems:   HTN (hypertension)   Polysubstance abuse   Non-ischemic cardiomyopathy (HCC)   Non-obstructive hypertrophic cardiomyopathy (HCC)   Renal failure (ARF), acute on chronic (HCC)    1. Syncope - in the setting of polysubstance abuse primarily concerning for arrhythmia. Will closely monitor in telemetry follow metabolic panel cycle cardiac markers. Check digoxin levels. Since patient had incontinence of urine will check CT head and EEG. 2. Nonischemic cardiomyopathy last EF measured this January 2018 for 2-D echo was 20-25% - patient also had cardiac cath this year in January. Denies any chest pain. Patient states he has been  compliant with his medications. Check digoxin levels. If creatinine worsens may have to hold off his diuretics and Enteresto and Bidil. Holding off Coreg due to patient admitting to have taken cocaine. 3. 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 UA is pending. Closely follow metabolic panel. 4. Chronic anemia probably from renal disease - follow CBC. 5. Polysubstance abuse - patient states he does not drink alcohol everyday but once in 2 weeks. Patient admits to taking cocaine. Social worker consulted.   DVT prophylaxis: Lovenox if CT head is negative. Code Status: Full code.  Family Communication: Discussed with patient.  Disposition Plan: Home.  Consults called: None.  Admission status: Observation.    Rise Patience MD [facility] Pager [phone].  If 7PM-7AM, please contact night-coverage [email or web address] Password [email or web address]  12/13/2016, 12:48 AM

## 2016-12-13 NOTE — Progress Notes (Signed)
Paged the on call MD for 5 beats run of V-Tach. He ordered B-Met and Mag level 

## 2016-12-13 NOTE — ED Notes (Signed)
Pt made aware of bed assignment 

## 2016-12-13 NOTE — Clinical Social Work Note (Signed)
Clinical Social Work Assessment  Patient Details  Name: Edward Mullen MRN: [id number] Date of Birth: [birth year]  Date of referral:  12/13/16               Reason for consult:  Substance Use/ETOH Abuse                Permission sought to share information with:    Permission granted to share information::  No  Name::        Agency::     Relationship::     Contact Information:     Housing/Transportation Living arrangements for the past 2 months:  Single Family Home Source of Information:  Patient, Medical Team Patient Interpreter Needed:  None Criminal Activity/Legal Involvement Pertinent to Current Situation/Hospitalization:  No - Comment as needed Significant Relationships:  Adult Children, Spouse Lives with:  Adult Children Do you feel safe going back to the place where you live?  Yes Need for family participation in patient care:  Yes (Comment)  Care giving concerns:  Social work consult for substance abuse resources   Facilities manager / plan:  CSW met with patient. No supports at bedside. CSW introduced role and inquired about interest in substance abuse resources. Patient stated he would think about it but agreeable to receiving the resources. CSW provided list for both inpatient and outpatient treatment centers in the [facility] area. No further concerns. CSW signing off as social work intervention is no longer needed.  Employment status:  Unemployed Forensic scientist:  Medicaid In [place] PT Recommendations:  Not assessed at this time Information / Referral to community resources:  Residential Substance Abuse Treatment Options, Outpatient Substance Abuse Treatment Options  Patient/Family's Response to care:  Patient agreeable to receiving substance abuse resources. Patient's daughter supportive and involved in patient's care. Patient appreciated social work intervention.  Patient/Family's Understanding of and Emotional Response to Diagnosis, Current  Treatment, and Prognosis:  Patient appears to have a good understanding of the reason for admission. Patient appears happy with hospital care.  Emotional Assessment Appearance:  Appears stated age Attitude/Demeanor/Rapport:  Other (Pleasant) Affect (typically observed):  Accepting, Appropriate, Calm, Pleasant Orientation:  Oriented to Self, Oriented to Place, Oriented to  Time, Oriented to Situation Alcohol / Substance use:  Illicit Drugs, Alcohol Use, Tobacco Use Psych involvement (Current and /or in the community):  No (Comment)  Discharge Needs  Concerns to be addressed:  Substance Abuse Concerns Readmission within the last 30 days:  No Current discharge risk:  Substance Abuse Barriers to Discharge:  Continued Medical Work up   Candie Chroman, LCSW 12/13/2016, 11:20 AM

## 2016-12-13 NOTE — Progress Notes (Signed)
Pt's troponin result is positive at 0.03 and asymtomatic. MD on call notified but he recommends that we monitor patient as pt is not C/O chest pain. Waiting for BMET and mag to be drawn. Will continue to monitor.

## 2016-12-13 NOTE — Progress Notes (Signed)
Was called by another RN sitting infront of the monitor by the nurses station to check patient, just  had 21 runs of [place].  No  Call received from CCMD for this alarm. Patient comfortable lying on bed, asymptomatic.Page Jettie Booze, NP, made aware, no new orders made.This Rn called CCMD Tech , the tech said she "I don't know how it come passed by me, I'm just here sitting and discharging patients". This RN asked if it is  21 run she said no its 25runs.Marland Kitchen

## 2016-12-13 NOTE — Progress Notes (Signed)
EEG Completed; Results Pending  

## 2016-12-13 NOTE — Consult Note (Addendum)
Advanced Heart Failure Team Consult Note   Primary Physician: Shela Leff, MD  Primary Cardiologist:  Dr. Aundra Dubin   Reason for Consultation: acute on chronic systolic CHF  HPI:    Edward Mullen is seen today for evaluation of acute on chronic systolic CHF at the request of Dr. Sloan Leiter.   Edward Mullen is a 61 year old male with a history of NICM (EF 20-25% in Jan. 2018), polysubstance abuse including cocaine and ETOH, and HTN.   Admitted 08/23/16-08/27/16 with new symptoms of CHF. Echo showed an EF of 15-20%, left and right heart cath showed an 80% PLV-2 lesion but no other disease. Fick output/index 4.24/2.01. He diuresed 23 pounds with IV lasix. Discharge weight was 193 pounds. He was referred to paramedicine.   Patient presented to the ED on 12/13/16 with syncope after using cocaine,marijuana and alcohol.  He says that he drank some liquor "too fast" after using cocaine and marijuana and started sweating profusely. He went outside to get some air, and woke up to the paramedics taking care of him. He denies chest pain or dizziness, does not remember passing out. He has never had a syncopal episode like this in the past. He uses cocaine frequently. He is followed by the paramedicine program here at the CHF clinic, continues to have intermittent compliance with his medications.    Review of Systems: [y] = yes, [ ]  = no   General: Weight gain Blue.Reese ]; Weight loss [ ] ; Anorexia [ ] ; Fatigue [ ] ; Fever [ ] ; Chills [ ] ; Weakness [ ]   Cardiac: Chest pain/pressure [ ] ; Resting SOB [ ] ; Exertional SOB [ ] ; Orthopnea [ ] ; Pedal Edema [ ] ; Palpitations [ ] ; Syncope [ y]; Presyncope [ ] ; Paroxysmal nocturnal dyspnea[ ]   Pulmonary: Cough [ ] ; Wheezing[ ] ; Hemoptysis[ ] ; Sputum [ ] ; Snoring [ ]   GI: Vomiting[ ] ; Dysphagia[ ] ; Melena[ ] ; Hematochezia [ ] ; Heartburn[ ] ; Abdominal pain [ ] ; Constipation [ ] ; Diarrhea [ ] ; BRBPR [ ]   GU: Hematuria[ ] ; Dysuria [ ] ; Nocturia[ ]   Vascular: Pain in legs  with walking Blue.Reese ]; Pain in feet with lying flat [ ] ; Non-healing sores [ ] ; Stroke [ ] ; TIA [ ] ; Slurred speech [ ] ;  Neuro: Headaches[ ] ; Vertigo[ ] ; Seizures[ ] ; Paresthesias[ ] ;Blurred vision [ ] ; Diplopia [ ] ; Vision changes [ ]   Ortho/Skin: Arthritis Blue.Reese ]; Joint pain [ ] ; Muscle pain [ ] ; Joint swelling [ ] ; Back Pain [ ] ; Rash [ ]   Psych: Depression[ ] ; Anxiety[ ]   Heme: Bleeding problems [ ] ; Clotting disorders [ ] ; Anemia [ ]   Endocrine: Diabetes [ ] ; Thyroid dysfunction[ ]   Home Medications Prior to Admission medications   Medication Sig Start Date End Date Taking? Authorizing Provider  aspirin 81 MG chewable tablet Chew 1 tablet (81 mg total) by mouth daily. 09/23/16  Yes Shela Leff, MD  atorvastatin (LIPITOR) 20 MG tablet Take 1 tablet (20 mg total) by mouth daily at 6 PM. 12/05/16   Larey Dresser, MD  carvedilol (COREG) 6.25 MG tablet Take 1.5 tablets (9.375 mg total) by mouth 2 (two) times daily with a meal. 12/05/16   Larey Dresser, MD  digoxin (LANOXIN) 0.125 MG tablet Take 1 tablet (0.125 mg total) by mouth daily. 12/05/16   Larey Dresser, MD  furosemide (LASIX) 40 MG tablet Take 1 tablet (40 mg total) by mouth daily. 12/05/16   Larey Dresser, MD  isosorbide-hydrALAZINE (BIDIL) 20-37.5 MG tablet  Take 1 tablet by mouth 3 (three) times daily. 12/05/16   Larey Dresser, MD  sacubitril-valsartan (ENTRESTO) 49-51 MG Take 1 tablet by mouth 2 (two) times daily. 12/05/16   Larey Dresser, MD  spironolactone (ALDACTONE) 25 MG tablet Take 1 tablet (25 mg total) by mouth daily. 12/05/16   Larey Dresser, MD    Past Medical History: Past Medical History:  Diagnosis Date  . Arthritis    "feels like it in my legs" (09/20/2014)  . Borderline type 2 diabetes mellitus   . Hypertension     Past Surgical History: Past Surgical History:  Procedure Laterality Date  . CARDIAC CATHETERIZATION N/A 08/27/2016   Procedure: Right/Left Heart Cath and Coronary Angiography;  Surgeon: Larey Dresser, MD;  Location: [facility];  Service: Cardiovascular;  Laterality: N/A;  . CYSTECTOMY Right    "back of my shoulder"  . TONSILLECTOMY      Family History: Family History  Problem Relation Age of Onset  . Stroke Mother 35  . Heart disease Father 1    Social History: Social History   Social History  . Marital status: Married    Spouse name: N/A  . Number of children: N/A  . 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 Smoking status: Current Every Day Smoker    Packs/day: 0.30    Years: 48.00    Types: Cigarettes  . Smokeless tobacco: Never Used     Comment: cutting back 2 per day  . Alcohol use 3.6 oz/week    6 Cans of beer per week  . Drug use: Yes    Types: Marijuana, Cocaine  . Sexual activity: Yes    Birth control/ protection: None   Other Topics Concern  . None   Social History Narrative  . None    Allergies:  No Known Allergies  Objective:    Vital Signs:   Temp:  [98.2 F (36.8 C)-98.8 F (37.1 C)] 98.4 F (36.9 C) (05/04 0806) Pulse Rate:  [72-96] 73 (05/04 1000) Resp:  [17-24] 18 (05/04 0806) BP: (80-126)/(49-98) 107/72 (05/04 0806) SpO2:  [97 %-100 %] 97 % (05/04 0806) Weight:  [191 lb 6.4 oz (86.8 kg)] 191 lb 6.4 oz (86.8 kg) (05/04 0105) Last BM Date: 12/12/16 (Per pt)  Weight change: Filed Weights   12/13/16 0105  Weight: 191 lb 6.4 oz (86.8 kg)    Intake/Output:   Intake/Output Summary (Last 24 hours) at 12/13/16 1059 Last data filed at 12/13/16 5809  Gross per 24 hour  Intake                0 ml  Output              200 ml  Net             -200 ml     Physical Exam: General: Older than stated age appearing male, NAD.  HEENT: normal Neck: supple. 5-6 cm JVP . Carotids 2+ bilat; no bruits. No lymphadenopathy or thyromegaly appreciated. Cor: PMI laterally displaced. Regular rate & rhythm.  No rubs, gallops. 2/6 HSM at apex.  Lungs: clear in all lobes bilaterally.  Abdomen: soft, nontender, nondistended. No hepatosplenomegaly. No bruits or masses. Good bowel sounds. Extremities: no cyanosis, clubbing, rash, edema.  Neuro: alert & orientedx3, cranial nerves grossly  intact. moves all 4 extremities w/o difficulty. Affect pleasant  Telemetry: NSR, short run of VT 4-5 beats. Personally reviewed  Labs: Basic Metabolic Panel:  Recent Labs Lab 12/12/16 2207 12/12/16 2218 12/13/16 0123 12/13/16 0249 12/13/16 0627  NA 139 143  --  139 137  K 3.4* 3.6  --  3.7 3.5  CL 105 107  --  105 105  CO2 22  --   --  24 24  GLUCOSE 115* 107*  --  106* 88  BUN 19 23*  --  20 20  CREATININE 1.91* 1.90* 1.85* 1.69* 1.60*  CALCIUM 8.7*  --   --  8.5* 8.5*  MG  --   --   --  1.9  --      CBC:  Recent Labs Lab 12/12/16 2207 12/12/16 2218 12/13/16 0123 12/13/16 0627  WBC 7.6  --  11.2* 9.9  NEUTROABS 5.2  --   --   --   HGB 12.4* 12.9* 12.7* 11.7*  HCT 38.7* 38.0* 39.2 36.2*  MCV 83.9  --  83.1 82.8  PLT 191  --  200 166    Cardiac Enzymes:  Recent Labs Lab 12/13/16 0123 12/13/16 0627  TROPONINI 0.03* 0.04*    BNP: BNP (last 3 results)  Recent Labs  08/05/16 1640 08/23/16 1520 09/06/16 1109  BNP 1,150.3* 2,218.9* 960.6*    Other results: EKG: NSR, LVH.  Imaging: Ct Head Wo Contrast  Result Date: 12/13/2016 CLINICAL DATA:  Syncope EXAM: CT HEAD WITHOUT CONTRAST TECHNIQUE: Contiguous axial images were obtained from the base of the skull through the vertex without intravenous contrast. COMPARISON:  None. FINDINGS: Brain: No acute territorial infarction, hemorrhage or intracranial mass. Mild to moderate periventricular hypodensity felt secondary to small vessel ischemia. Nonenlarged ventricles. Vascular: No hyperdense vessels.  Carotid artery calcifications. Skull: No fracture or suspicious bone lesion. Poor dentition of the maxilla anteriorly. Sinuses/Orbits:  Mucous retention cysts and mucosal thickening in the maxillary, sphenoid, ethmoid and frontal sinuses. No acute orbital abnormality. Other: None IMPRESSION: No CT evidence for acute intracranial abnormality. Mild to moderate periventricular small vessel ischemic changes Electronically Signed   By: Donavan Foil M.D.   On: 12/13/2016 02:07   Dg Chest Port 1 View  Result Date: 12/12/2016 CLINICAL DATA:  Syncope at home tonight.  Hypotension. EXAM: PORTABLE CHEST 1 VIEW COMPARISON:  08/23/2016 FINDINGS: A single AP portable view of the chest demonstrates no focal airspace consolidation or alveolar edema. The lungs are grossly clear. There is no large effusion or pneumothorax. There is unchanged cardiomegaly. Cardiac and mediastinal contours are otherwise unremarkable. IMPRESSION: Unchanged cardiomegaly. No consolidation or effusion. Normal vasculature. Electronically Signed   By: Andreas Newport M.D.   On: 12/12/2016 22:16      Medications:     Current Medications: . aspirin  81 mg Oral Daily  . atorvastatin  20 mg Oral q1800  . digoxin  0.125 mg Oral Daily  . enoxaparin (LOVENOX) injection  40 mg Subcutaneous Q24H  . furosemide  40 mg Oral Daily  . isosorbide-hydrALAZINE  1 tablet Oral TID  . sacubitril-valsartan  1 tablet Oral BID  . spironolactone  25 mg Oral Daily     Infusions:    Assessment/Plan   1. Syncope: In the setting of polysubstance abuse.  - There is some concern for arrhythmia - VT/VF with low EF. There is a short run of VT on tele.  - No ICD as medical therapy has not been optimized and history of  noncompliance. - Advised him to stop using cocaine, drinking alcohol and smoking marijuana.  2. Chronic combined systolic and diastolic CHF: NICM EF 01-09% with moderate to severe MR. Etiology HTN vs. Cocaine/ETOH. SPEP and M-spike negative in the past.  - NYHA II - Volume status stable to dry. He likely can get away with taking Lasix 40mg  every other day since he is on  Entresto as well as [place] to restart coreg with alpha blocking effect. Would restart at lower dose at 6.25mg  BID as his BP is a little soft.  - Continue digoxin 0.125mg  - Continue Bidil TID.  - Continue Entresto 24/26 mg BID.  - Continue Spiro 25 mg daily.  3. Polysubstance abuse: Positive for cocaine Jan. 2018, Feb. 2018, May 2018.  - We talked about cessation and the negative effects on his heart and overall health.  4. Acute on chronic CKD stage III: GFR 52 - Stable. Will follow with daily BMET.    Length of Stay: [place], NP  12/13/2016, 10:59 AM Arbutus Leas, NP-C Advanced Heart Failure Team  Pager [phone] M-F 7am-4pm.  Please contact [facility] for night-coverage after hours (4p -7a ) and weekends on [email or web address]  Patient seen and examined with Jettie Booze, NP. We discussed all aspects of the encounter. I agree with the assessment and plan as stated above.   61 y/o male with severe NICM in the setting of polysubstance abuse. Now admitted with recurrent syncope in setting of active substance abuse. On tele he has had severe 5-10 beat runs NSVT. Suspect syncope related to VT (versus vasovagal episodes). Unfortunately he does not qualify for ICD with ongoing substance abuse. Will continue carvedilol. Supp K and Mg. Long talk with him about extremely high risk for SCD especially during times of substance abuse. Recommended complete abstinence for substance abuse. If he can accomplish this will refer for ICD placement in several months if EF remains low.   Volume status stable. Can likely go home in am once electrolytes supped.   I d/w Dr. Aundra Dubin who knows him well.   Glori Bickers, MD  4:42 PM

## 2016-12-14 DIAGNOSIS — I472 Ventricular tachycardia: Secondary | ICD-10-CM | POA: Diagnosis not present

## 2016-12-14 DIAGNOSIS — R55 Syncope and collapse: Secondary | ICD-10-CM | POA: Diagnosis not present

## 2016-12-14 DIAGNOSIS — I428 Other cardiomyopathies: Secondary | ICD-10-CM | POA: Diagnosis not present

## 2016-12-14 DIAGNOSIS — I5022 Chronic systolic (congestive) heart failure: Secondary | ICD-10-CM | POA: Diagnosis not present

## 2016-12-14 DIAGNOSIS — F1910 Other psychoactive substance abuse, uncomplicated: Secondary | ICD-10-CM | POA: Diagnosis not present

## 2016-12-14 LAB — BASIC METABOLIC PANEL
Anion gap: 9 (ref 5–15)
BUN: 18 mg/dL (ref 6–20)
CO2: 25 mmol/L (ref 22–32)
Calcium: 8.6 mg/dL — ABNORMAL LOW (ref 8.9–10.3)
Chloride: 104 mmol/L (ref 101–111)
Creatinine, Ser: 1.39 mg/dL — ABNORMAL HIGH (ref 0.61–1.24)
GFR calc Af Amer: >60 mL/min (ref >60)
GFR calc non Af Amer: 53 mL/min — ABNORMAL LOW (ref >60)
Glucose, Bld: 88 mg/dL (ref 65–99)
Potassium: 3.9 mmol/L (ref 3.5–5.1)
Sodium: 138 mmol/L (ref 135–145)

## 2016-12-14 LAB — MAGNESIUM: Magnesium: 2.1 mg/dL (ref 1.7–2.4)

## 2016-12-14 MED ORDER — DIGOXIN 125 MCG PO TABS: 0.0625 mg | ORAL_TABLET | Freq: Every day | ORAL | Status: DC

## 2016-12-14 MED ORDER — AMIODARONE HCL 200 MG PO TABS
200.0000 mg | ORAL_TABLET | Freq: Two times a day (BID) | ORAL | Status: DC
  Administered 2016-12-14: 200 mg via ORAL
  Filled 2016-12-14: qty 1

## 2016-12-14 MED ORDER — AMIODARONE HCL 200 MG PO TABS: 200.0000 mg | ORAL_TABLET | Freq: Two times a day (BID) | ORAL | 0 refills | Status: DC

## 2016-12-14 MED ORDER — DIGOXIN 125 MCG PO TABS: 0.0625 mg | ORAL_TABLET | Freq: Every day | ORAL | 0 refills | Status: DC

## 2016-12-14 NOTE — Discharge Summary (Addendum)
PATIENT DETAILS Name: Edward Mullen Age: 61 y.o. Sex: male Date of Birth: Dec 03, 1955 MRN: [id number]. Admitting Physician: Rise Patience, MD [id number], Wandra Feinstein, MD  Admit Date: 12/12/2016 Discharge date: 12/14/2016  Recommendations for Outpatient Follow-up:  1. Follow up with PCP in 1-2 weeks 2. Please obtain BMP/CBC in one week 3. Please follow up at the CHF clinic  Admitted From:  Home   Disposition: [place]: No  Equipment/Devices: None  Discharge Condition: Stable  CODE STATUS: FULL CODE  Diet recommendation:  Heart Healthy  Brief Summary: See H&P, Labs, Consult and Test reports for all details in brief, patient is a 61 year old male with history of nonischemic cardiomyopathy-ongoing alcohol/cocaine use-who was admitted for evaluation of a syncopal episode.  Brief Hospital Course: Syncope: Given significantly low EF/nonischemic cardiomyopathy-suspicion for VT/VF-however he did have a prodrome of lightheadedness and flushing-so could have had a vasovagal syncope as well. Telemetry monitoring revealed NSVT during this hospital stay. CHF team was consulted, unfortunately given ongoing cocaine use-not a candidate for AICD. Patient has been counseled extensively regarding abstinence from cocaine/alcohol-he has been started on amiodarone, digoxin dosage has been decreased-Coreg has been discontinued at the suggestion of CHF team. CHF team will follow patient in the outpatient setting closely and consider LifeVest fitting.  Chronic systolic heart failure (EF 15-20%): volume status stable-resume usual diuretic regimen.  Chronic kidney disease stage III: Creatinine close to usual baseline.  Polysubstance abuse: Counseled extensively  Rest of his medical problems were stable during this short hospital stay  Procedures/Studies: None  Discharge Diagnoses:  Principal Problem:   Syncope Active Problems:   HTN (hypertension)   Polysubstance  abuse   Non-ischemic cardiomyopathy (HCC)   Non-obstructive hypertrophic cardiomyopathy (HCC)   Renal failure (ARF), acute on chronic [facility])   Discharge Instructions:  Activity:  As tolerated with Full fall precautions use walker/cane & assistance as needed  Discharge Instructions    (HEART FAILURE PATIENTS) Call MD:  Anytime you have any of the following symptoms: 1) 3 pound weight gain in 24 hours or 5 pounds in 1 week 2) shortness of breath, with or without a dry hacking cough 3) swelling in the hands, feet or stomach 4) if you have to sleep on extra pillows at night in order to breathe.    Complete by:  As directed    Diet - low sodium heart healthy    Complete by:  As directed    Discharge instructions    Complete by:  As directed    Please do not drive until cleared by outpatient M.D.   Increase activity slowly    Complete by:  As directed      Allergies as of 12/14/2016   No Known Allergies     Medication List    STOP taking these medications   carvedilol 6.25 MG tablet Commonly known as:  COREG     TAKE these medications   amiodarone 200 MG tablet Commonly known as:  PACERONE Take 1 tablet (200 mg total) by mouth 2 (two) times daily.   aspirin 81 MG chewable tablet Chew 1 tablet (81 mg total) by mouth daily.   atorvastatin 20 MG tablet Commonly known as:  LIPITOR Take 1 tablet (20 mg total) by mouth daily at 6 PM.   digoxin 0.125 MG tablet Commonly known as:  LANOXIN Take 0.5 tablets (0.0625 mg total) by mouth daily. What changed:  how much to take   furosemide 40 MG tablet Commonly known as:  LASIX Take 1 tablet (40 mg total) by mouth daily.   isosorbide-hydrALAZINE 20-37.5 MG tablet Commonly known as:  BIDIL Take 1 tablet by mouth 3 (three) times daily.   sacubitril-valsartan 49-51 MG Commonly known as:  ENTRESTO Take 1 tablet by mouth 2 (two) times daily.   spironolactone 25 MG tablet Commonly known as:  ALDACTONE Take 1 tablet (25 mg total) by  mouth daily.      Follow-up Information    Shela Leff, MD. Schedule an appointment as soon as possible for a visit in 1 week(s).   Specialty:  Internal Medicine Contact information: [place] [address] [phone]        Larey Dresser, MD. Go in 3 day(s).   Specialty:  Cardiology Contact information: [address] [place] [address] [phone]          No Known Allergies  Consultations:   cardiology  Other Procedures/Studies: Ct Head Wo Contrast  Result Date: 12/13/2016 CLINICAL DATA:  Syncope EXAM: CT HEAD WITHOUT CONTRAST TECHNIQUE: Contiguous axial images were obtained from the base of the skull through the vertex without intravenous contrast. COMPARISON:  None. FINDINGS: Brain: No acute territorial infarction, hemorrhage or intracranial mass. Mild to moderate periventricular hypodensity felt secondary to small vessel ischemia. Nonenlarged ventricles. Vascular: No hyperdense vessels.  Carotid artery calcifications. Skull: No fracture or suspicious bone lesion. Poor dentition of the maxilla anteriorly. Sinuses/Orbits: Mucous retention cysts and mucosal thickening in the maxillary, sphenoid, ethmoid and frontal sinuses. No acute orbital abnormality. Other: None IMPRESSION: No CT evidence for acute intracranial abnormality. Mild to moderate periventricular small vessel ischemic changes Electronically Signed   By: Donavan Foil M.D.   On: 12/13/2016 02:07   Dg Chest Port 1 View  Result Date: 12/12/2016 CLINICAL DATA:  Syncope at home tonight.  Hypotension. EXAM: PORTABLE CHEST 1 VIEW COMPARISON:  08/23/2016 FINDINGS: A single AP portable view of the chest demonstrates no focal airspace consolidation or alveolar edema. The lungs are grossly clear. There is no large effusion or pneumothorax. There is unchanged cardiomegaly. Cardiac and mediastinal contours are otherwise unremarkable. IMPRESSION: Unchanged cardiomegaly. No  consolidation or effusion. Normal vasculature. Electronically Signed   By: Andreas Newport M.D.   On: 12/12/2016 22:16     TODAY-DAY OF DISCHARGE:  Subjective:   Edward Mullen today has no headache,no chest abdominal pain,no new weakness tingling or numbness, feels much better wants to go home today. Objective:   Blood pressure 107/79, pulse 68, temperature 98.1 F (36.7 C), temperature source Oral, resp. rate 16, height 6' (1.829 m), weight 86.9 kg (191 lb 8 oz), SpO2 100 %.  Intake/Output Summary (Last 24 hours) at 12/14/16 1222 Last data filed at 12/14/16 1118  Gross per 24 hour  Intake             1230 ml  Output              825 ml  Net              405 ml   Filed Weights   12/13/16 0105 12/14/16 0540  Weight: 86.8 kg (191 lb 6.4 oz) 86.9 kg (191 lb 8 oz)    Exam: Awake Alert, Oriented *3, No new F.N deficits, Normal affect [place].AT,PERRAL Supple Neck,No JVD, No cervical lymphadenopathy appriciated.  Symmetrical Chest wall movement, Good air movement bilaterally, CTAB RRR,No Gallops,Rubs or new Murmurs, No Parasternal Heave +ve B.Sounds, Abd Soft, Non tender, No organomegaly appriciated, No rebound -  guarding or rigidity. No Cyanosis, Clubbing or edema, No new Rash or bruise   PERTINENT RADIOLOGIC STUDIES: Ct Head Wo Contrast  Result Date: 12/13/2016 CLINICAL DATA:  Syncope EXAM: CT HEAD WITHOUT CONTRAST TECHNIQUE: Contiguous axial images were obtained from the base of the skull through the vertex without intravenous contrast. COMPARISON:  None. FINDINGS: Brain: No acute territorial infarction, hemorrhage or intracranial mass. Mild to moderate periventricular hypodensity felt secondary to small vessel ischemia. Nonenlarged ventricles. Vascular: No hyperdense vessels.  Carotid artery calcifications. Skull: No fracture or suspicious bone lesion. Poor dentition of the maxilla anteriorly. Sinuses/Orbits: Mucous retention cysts and mucosal thickening in the maxillary, sphenoid,  ethmoid and frontal sinuses. No acute orbital abnormality. Other: None IMPRESSION: No CT evidence for acute intracranial abnormality. Mild to moderate periventricular small vessel ischemic changes Electronically Signed   By: Donavan Foil M.D.   On: 12/13/2016 02:07   Dg Chest Port 1 View  Result Date: 12/12/2016 CLINICAL DATA:  Syncope at home tonight.  Hypotension. EXAM: PORTABLE CHEST 1 VIEW COMPARISON:  08/23/2016 FINDINGS: A single AP portable view of the chest demonstrates no focal airspace consolidation or alveolar edema. The lungs are grossly clear. There is no large effusion or pneumothorax. There is unchanged cardiomegaly. Cardiac and mediastinal contours are otherwise unremarkable. IMPRESSION: Unchanged cardiomegaly. No consolidation or effusion. Normal vasculature. Electronically Signed   By: Andreas Newport M.D.   On: 12/12/2016 22:16     PERTINENT LAB RESULTS: CBC:  Recent Labs  12/13/16 0123 12/13/16 0627  WBC 11.2* 9.9  HGB 12.7* 11.7*  HCT 39.2 36.2*  PLT 200 166   CMET CMP     Component Value Date/Time   NA 138 12/14/2016 0148   K 3.9 12/14/2016 0148   CL 104 12/14/2016 0148   CO2 25 12/14/2016 0148   GLUCOSE 88 12/14/2016 0148   BUN 18 12/14/2016 0148   CREATININE 1.39 (H) 12/14/2016 0148   CREATININE 1.21 06/19/2012 1346   CALCIUM 8.6 (L) 12/14/2016 0148   PROT 5.4 (L) 08/24/2016 0251   ALBUMIN 2.5 (L) 08/24/2016 0251   AST 36 08/24/2016 0251   ALT 32 08/24/2016 0251   ALKPHOS 78 08/24/2016 0251   BILITOT 0.9 08/24/2016 0251   GFRNONAA 53 (L) 12/14/2016 0148   GFRNONAA 66 06/19/2012 1346   GFRAA >60 12/14/2016 0148   GFRAA 76 06/19/2012 1346    GFR Estimated Creatinine Clearance: 60.5 mL/min (A) (by C-G formula based on SCr of 1.39 mg/dL (H)). No results for input(s): LIPASE, AMYLASE in the last 72 hours.  Recent Labs  12/13/16 0123 12/13/16 0627 12/13/16 1221  TROPONINI 0.03* 0.04* 0.04*   Invalid input(s): POCBNP No results for input(s):  DDIMER in the last 72 hours. No results for input(s): HGBA1C in the last 72 hours. No results for input(s): CHOL, HDL, LDLCALC, TRIG, CHOLHDL, LDLDIRECT in the last 72 hours.  Recent Labs  12/13/16 0123  TSH 0.616   No results for input(s): VITAMINB12, FOLATE, FERRITIN, TIBC, IRON, RETICCTPCT in the last 72 hours. Coags: No results for input(s): INR in the last 72 hours.  Invalid input(s): PT Microbiology: No results found for this or any previous visit (from the past 240 hour(s)).  FURTHER DISCHARGE INSTRUCTIONS:  Get Medicines reviewed and adjusted: Please take all your medications with you for your next visit with your Primary MD  Laboratory/radiological data: Please request your Primary MD to go over all hospital tests and procedure/radiological results at the follow up, please ask your Primary MD to  get all Hospital records sent to his/her office.  In some cases, they will be blood work, cultures and biopsy results pending at the time of your discharge. Please request that your primary care M.D. goes through all the records of your hospital data and follows up on these results.  Also Note the following: If you experience worsening of your admission symptoms, develop shortness of breath, life threatening emergency, suicidal or homicidal thoughts you must seek medical attention immediately by calling 911 or calling your MD immediately  if symptoms less severe.  You must read complete instructions/literature along with all the possible adverse reactions/side effects for all the Medicines you take and that have been prescribed to you. Take any new Medicines after you have completely understood and accpet all the possible adverse reactions/side effects.   Do not drive when taking Pain medications or sleeping medications (Benzodaizepines)  Do not take more than prescribed Pain, Sleep and Anxiety Medications. It is not advisable to combine anxiety,sleep and pain medications without  talking with your primary care practitioner  Special Instructions: If you have smoked or chewed Tobacco  in the last 2 yrs please stop smoking, stop any regular Alcohol  and or any Recreational drug use.  Wear Seat belts while driving.  Please note: You were cared for by a hospitalist during your hospital stay. Once you are discharged, your primary care physician will handle any further medical issues. Please note that NO REFILLS for any discharge medications will be authorized once you are discharged, as it is imperative that you return to your primary care physician (or establish a relationship with a primary care physician if you do not have one) for your post hospital discharge needs so that they can reassess your need for medications and monitor your lab values.  Total Time spent coordinating discharge including counseling, education and face to face time equals  45 minutes.  SignedOren Binet 12/14/2016 12:22 PM

## 2016-12-14 NOTE — Progress Notes (Signed)
Advanced Heart Failure Rounding Note   Subjective:     Feels ok. Anxious to go home. "Sweaty" at times. No CP or SOB  Tele shows several runs NSVT which were asx.   Objective:   Weight Range:  Vital Signs:   Temp:  [97.7 F (36.5 C)-98.8 F (37.1 C)] 97.7 F (36.5 C) (05/05 1215) Pulse Rate:  [64-73] 73 (05/05 1215) Resp:  [16-20] 20 (05/05 1215) BP: (102-120)/(73-79) 120/76 (05/05 1215) SpO2:  [97 %-100 %] 97 % (05/05 1215) Weight:  [86.9 kg (191 lb 8 oz)] 86.9 kg (191 lb 8 oz) (05/05 0540) Last BM Date: 12/13/16  Weight change: Filed Weights   12/13/16 0105 12/14/16 0540  Weight: 86.8 kg (191 lb 6.4 oz) 86.9 kg (191 lb 8 oz)    Intake/Output:   Intake/Output Summary (Last 24 hours) at 12/14/16 1312 Last data filed at 12/14/16 1118  Gross per 24 hour  Intake             1230 ml  Output              825 ml  Net              405 ml     Physical Exam: General:  Lyingin bed No resp difficulty HEENT: normal Neck: supple. JVP 5-6 . Carotids 2+ bilat; no bruits. No lymphadenopathy or thryomegaly appreciated. Cor: PMI laterally displaced. Regular rate & rhythm. 2/6 MR Lungs: clear Abdomen: soft, nontender, nondistended. No hepatosplenomegaly. No bruits or masses. Good bowel sounds. Extremities: no cyanosis, clubbing, rash, edema Neuro: alert & orientedx3, cranial nerves grossly intact. moves all 4 extremities w/o difficulty. Affect pleasant  Telemetry: NSR 2 runs > 20beat NSVT  Labs: Basic Metabolic Panel:  Recent Labs Lab 12/12/16 2207 12/12/16 2218 12/13/16 0123 12/13/16 0249 12/13/16 0627 12/14/16 0148 12/14/16 0845  NA 139 143  --  139 137 138  --   K 3.4* 3.6  --  3.7 3.5 3.9  --   CL 105 107  --  105 105 104  --   CO2 22  --   --  24 24 25   --   GLUCOSE 115* 107*  --  106* 88 88  --   BUN 19 23*  --  20 20 18   --   CREATININE 1.91* 1.90* 1.85* 1.69* 1.60* 1.39*  --   CALCIUM 8.7*  --   --  8.5* 8.5* 8.6*  --   MG  --   --   --  1.9  --    --  2.1    Liver Function Tests: No results for input(s): AST, ALT, ALKPHOS, BILITOT, PROT, ALBUMIN in the last 168 hours. No results for input(s): LIPASE, AMYLASE in the last 168 hours. No results for input(s): AMMONIA in the last 168 hours.  CBC:  Recent Labs Lab 12/12/16 2207 12/12/16 2218 12/13/16 0123 12/13/16 0627  WBC 7.6  --  11.2* 9.9  NEUTROABS 5.2  --   --   --   HGB 12.4* 12.9* 12.7* 11.7*  HCT 38.7* 38.0* 39.2 36.2*  MCV 83.9  --  83.1 82.8  PLT 191  --  200 166    Cardiac Enzymes:  Recent Labs Lab 12/13/16 0123 12/13/16 0627 12/13/16 1221  TROPONINI 0.03* 0.04* 0.04*    BNP: BNP (last 3 results)  Recent Labs  08/05/16 1640 08/23/16 1520 09/06/16 1109  BNP 1,150.3* 2,218.9* 960.6*    ProBNP (last 3 results) No results  for input(s): PROBNP in the last 8760 hours.    Other results:  Imaging: Ct Head Wo Contrast  Result Date: 12/13/2016 CLINICAL DATA:  Syncope EXAM: CT HEAD WITHOUT CONTRAST TECHNIQUE: Contiguous axial images were obtained from the base of the skull through the vertex without intravenous contrast. COMPARISON:  None. FINDINGS: Brain: No acute territorial infarction, hemorrhage or intracranial mass. Mild to moderate periventricular hypodensity felt secondary to small vessel ischemia. Nonenlarged ventricles. Vascular: No hyperdense vessels.  Carotid artery calcifications. Skull: No fracture or suspicious bone lesion. Poor dentition of the maxilla anteriorly. Sinuses/Orbits: Mucous retention cysts and mucosal thickening in the maxillary, sphenoid, ethmoid and frontal sinuses. No acute orbital abnormality. Other: None IMPRESSION: No CT evidence for acute intracranial abnormality. Mild to moderate periventricular small vessel ischemic changes Electronically Signed   By: Donavan Foil M.D.   On: 12/13/2016 02:07   Dg Chest Port 1 View  Result Date: 12/12/2016 CLINICAL DATA:  Syncope at home tonight.  Hypotension. EXAM: PORTABLE CHEST 1 VIEW  COMPARISON:  08/23/2016 FINDINGS: A single AP portable view of the chest demonstrates no focal airspace consolidation or alveolar edema. The lungs are grossly clear. There is no large effusion or pneumothorax. There is unchanged cardiomegaly. Cardiac and mediastinal contours are otherwise unremarkable. IMPRESSION: Unchanged cardiomegaly. No consolidation or effusion. Normal vasculature. Electronically Signed   By: Andreas Newport M.D.   On: 12/12/2016 22:16      Medications:     Scheduled Medications: . amiodarone  200 mg Oral BID  . aspirin  81 mg Oral Daily  . atorvastatin  20 mg Oral q1800  . [START ON 12/15/2016] digoxin  0.0625 mg Oral Daily  . enoxaparin (LOVENOX) injection  40 mg Subcutaneous Q24H  . [START ON 12/15/2016] furosemide  40 mg Oral Q48H  . isosorbide-hydrALAZINE  1 tablet Oral TID  . sacubitril-valsartan  1 tablet Oral BID  . spironolactone  25 mg Oral Daily     Infusions:   PRN Medications:  acetaminophen **OR** acetaminophen   Assessment:   1. Syncope 2. NSVT 3. Chronic systolic HF 4. Polysubstance abuse 5. Hypokalemia  Plan/Discussion:    He has had several 20+ beat runs on NSVT. Suspect syncope related to VT. He is not candidate for ICD due to ongoing substance abuse. We discussed the options of medical therapy versus LifeVest. He wants to proceed with LifeVest but says he does not want to stay in the hosptial any longer and wants to start the Vest as an outpatient. I discussed the risk of SCD with him if he goes home without the Vest even for only 1-2 days. He understood this. Will start amio 200 bid. BP too low for carvedilol. Supp K and Mag.   I discussed Lifevest with Dr. Aundra Dubin and Janan Halter, RN (LifeVest RN). Disposition also d/w Dr. Nigel Bridgeman.     Length of Stay: 0   Rosco Harriott 12/14/2016, 1:12 PM  Advanced Heart Failure Team Pager [phone] (M-F; 7a - 4p)  Please contact [facility] for night-coverage after hours (4p -7a )  and weekends on [email or web address]

## 2016-12-14 NOTE — Progress Notes (Signed)
PA Simmons notified regarding 24 beat run of VTach. Patient asymptomatic.

## 2016-12-16 ENCOUNTER — Other Ambulatory Visit (HOSPITAL_COMMUNITY): Payer: Self-pay

## 2016-12-16 NOTE — Progress Notes (Signed)
Paramedicine Encounter    Patient ID: Edward Mullen, male    DOB: [birth year], 61 y.o.   MRN: [id number]    Patient Care Team: Shela Leff, MD as PCP - General  Patient Active Problem List   Diagnosis Date Noted  . Renal failure (ARF), acute on chronic (HCC) 12/13/2016  . Syncope 12/12/2016  . Chronic systolic heart failure ([place]) 09/06/2016  . NSVT (nonsustained ventricular tachycardia) ([place]) 09/06/2016  . Non-ischemic cardiomyopathy ([place]) 08/27/2016  . Non-obstructive hypertrophic cardiomyopathy ([place]) 08/27/2016  . Mitral regurgitation 08/27/2016  . Polysubstance abuse   . Blurred vision, bilateral 09/20/2014  . Tobacco abuse 09/19/2014  . HTN (hypertension) 03/28/2011  . Claudication of both lower extremities ([place]) 03/28/2011  . Erectile dysfunction 03/28/2011    Current Outpatient Prescriptions:  .  amiodarone (PACERONE) 200 MG tablet, Take 1 tablet (200 mg total) by mouth 2 (two) times daily., Disp: 60 tablet, Rfl: 0 .  aspirin 81 MG chewable tablet, Chew 1 tablet (81 mg total) by mouth daily., Disp: 90 tablet, Rfl: 3 .  atorvastatin (LIPITOR) 20 MG tablet, Take 1 tablet (20 mg total) by mouth daily at 6 PM., Disp: 90 tablet, Rfl: 3 .  digoxin (LANOXIN) 0.125 MG tablet, Take 0.5 tablets (0.0625 mg total) by mouth daily., Disp: 30 tablet, Rfl: 0 .  furosemide (LASIX) 40 MG tablet, Take 1 tablet (40 mg total) by mouth daily., Disp: 30 tablet, Rfl: 6 .  isosorbide-hydrALAZINE (BIDIL) 20-37.5 MG tablet, Take 1 tablet by mouth 3 (three) times daily., Disp: 90 tablet, Rfl: 6 .  sacubitril-valsartan (ENTRESTO) 49-51 MG, Take 1 tablet by mouth 2 (two) times daily., Disp: 60 tablet, Rfl: 11 .  spironolactone (ALDACTONE) 25 MG tablet, Take 1 tablet (25 mg total) by mouth daily., Disp: 90 tablet, Rfl: 3 No Known Allergies   Social History   Social History  . Marital status: Married    Spouse name: N/A  . Number of children: N/A  . 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 Smoking status: Current Every Day Smoker    Packs/day: 0.30    Years: 48.00    Types: Cigarettes  . Smokeless tobacco: Never Used     Comment: cutting back 2 per day  . Alcohol use 3.6 oz/week    6 Cans of beer per week  . Drug use: Yes    Types: Marijuana, Cocaine  . Sexual activity: Yes    Birth control/ protection: None   Other Topics Concern  . Not on file   Social History Narrative  . No narrative on file    Physical Exam  Eyes: Pupils are equal, round, and reactive to light.  Pulmonary/Chest: No respiratory distress. He has no wheezes. He has no rales.  Abdominal: Soft. He exhibits no distension. There is no tenderness. There is no guarding.  Musculoskeletal: He exhibits no edema.  Skin: Skin is warm and dry. He is not diaphoretic.        Future Appointments Date Time Provider [place]  01/29/2017 11:20 AM [facility] None    ATF pt lying on the couch. pts weight is up to 197-got home from hospital Sunday-he admits to drinking a lot of water when he got home from hospital.  Pt denies sob, dizziness, headache and chest pain.  Pts pill box revised  due to prescription revision after hospital stay.    BP (!) 150/90   Pulse 62   Resp 15   Wt 197 lb (89.4 kg)   SpO2 98%   BMI 26.72 kg/m   Weight yesterday-195 Last visit weight-192    Child Campoy, EMT Paramedic 12/16/2016    ACTION: Home visit completed

## 2016-12-18 ENCOUNTER — Other Ambulatory Visit (HOSPITAL_COMMUNITY): Payer: Self-pay

## 2016-12-18 NOTE — Progress Notes (Signed)
Paramedicine Encounter    Patient ID: Edward Mullen, male    DOB: Dec 22, 1955, 61 y.o.   MRN: [id number]    Patient Care Team: Shela Leff, MD as PCP - General  Patient Active Problem List   Diagnosis Date Noted  . Renal failure (ARF), acute on chronic (HCC) 12/13/2016  . Syncope 12/12/2016  . Chronic systolic heart failure ([place]) 09/06/2016  . NSVT (nonsustained ventricular tachycardia) ([place]) 09/06/2016  . Non-ischemic cardiomyopathy ([place]) 08/27/2016  . 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 Mitral regurgitation 08/27/2016  . Polysubstance abuse   . Blurred vision, bilateral 09/20/2014  . Tobacco abuse 09/19/2014  . HTN (hypertension) 03/28/2011  . Claudication of both lower extremities ([place]) 03/28/2011  . Erectile dysfunction 03/28/2011    Current Outpatient Prescriptions:  .  amiodarone (PACERONE) 200 MG tablet, Take 1 tablet (200 mg total) by mouth 2 (two) times daily., Disp: 60 tablet, Rfl: 0 .  aspirin 81 MG chewable tablet, Chew 1 tablet (81 mg total) by mouth daily., Disp: 90 tablet, Rfl: 3 .  atorvastatin (LIPITOR) 20 MG tablet, Take 1 tablet (20 mg total) by mouth daily at 6 PM., Disp: 90 tablet, Rfl: 3 .  digoxin (LANOXIN) 0.125 MG tablet, Take 0.5 tablets (0.0625 mg total) by mouth daily., Disp: 30 tablet, Rfl: 0 .  furosemide (LASIX) 40 MG tablet, Take 1 tablet (40 mg total) by mouth daily., Disp: 30 tablet, Rfl: 6 .  isosorbide-hydrALAZINE (BIDIL) 20-37.5 MG tablet, Take 1 tablet by mouth 3 (three) times daily., Disp: 90 tablet, Rfl: 6 .  sacubitril-valsartan (ENTRESTO) 49-51 MG, Take 1 tablet by mouth 2 (two) times daily., Disp: 60 tablet, Rfl: 11 .  spironolactone (ALDACTONE) 25 MG tablet, Take 1 tablet (25 mg total) by mouth daily., Disp: 90 tablet, Rfl: 3 No Known Allergies   Social History   Social History  . Marital status: Married    Spouse name: N/A  . Number of children: N/A  . 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 Smoking status: Current Every Day Smoker    Packs/day: 0.30    Years: 48.00    Types: Cigarettes  . Smokeless tobacco: Never Used     Comment: cutting back 2 per day  . Alcohol use 3.6 oz/week    6 Cans of beer per week  . Drug use: Yes    Types: Marijuana, Cocaine  . Sexual activity: Yes    Birth control/ protection: None   Other Topics Concern  . Not on file   Social History Narrative  . No narrative on file    Physical Exam  Eyes: Pupils are equal, round, and reactive to light.  Pulmonary/Chest: No respiratory distress. He has no wheezes. He has no rales.  Abdominal: He exhibits no distension. There is no tenderness. There is no guarding.  Musculoskeletal: He exhibits no edema.  Skin: Skin is warm and dry. He is not diaphoretic.        Future Appointments Date Time Provider [place]  12/19/2016 10:15 AM [facility] [place] [facility]  01/29/2017 11:20 AM [facility] CLINIC [facility] None    ATF pt CAO x4 sitting in the living room with no complaints. Pt has a life vest on that was given to him yesterday.  Pt had a syncopal episode last week and was  hospitalized for a few days. Pt stated that he has not had any dizziness or syncope since last time. Pt denies headache, sob, and chest pain. Pt has taken his medications without difficulty.  Pt hasn't had any alcohol to drink and has been staying on the low sodium diet. Pt has a f/u appointment tomorrow with internal medicine.  Pt stated that he should have transportation. Pt's weight is up three lbs over night. He stated that he had a little more water and kool aide last night than usual.  Heart clinic notified of the same via voicemail.  rx bottles verified and pill boxes refilled.   BP 128/88 (BP Location: Left Arm, Patient Position: Sitting, Cuff Size: Normal)   Pulse  70   Resp 16   Wt 201 lb (91.2 kg)   SpO2 98%   BMI 27.26 kg/m   Weight yesterday-198 Last visit weight-197    Abeer Iversen, EMT Paramedic 12/18/2016    ACTION: Home visit completed

## 2016-12-19 ENCOUNTER — Telehealth (HOSPITAL_COMMUNITY): Payer: Self-pay

## 2016-12-19 ENCOUNTER — Ambulatory Visit (INDEPENDENT_AMBULATORY_CARE_PROVIDER_SITE_OTHER): Payer: Medicaid Other | Admitting: Internal Medicine

## 2016-12-19 VITALS — BP 139/72 | HR 71 | Temp 97.9°F | Wt 203.7 lb

## 2016-12-19 DIAGNOSIS — D649 Anemia, unspecified: Secondary | ICD-10-CM | POA: Diagnosis not present

## 2016-12-19 DIAGNOSIS — I472 Ventricular tachycardia: Secondary | ICD-10-CM | POA: Diagnosis not present

## 2016-12-19 DIAGNOSIS — F17210 Nicotine dependence, cigarettes, uncomplicated: Secondary | ICD-10-CM

## 2016-12-19 DIAGNOSIS — F1911 Other psychoactive substance abuse, in remission: Secondary | ICD-10-CM | POA: Diagnosis not present

## 2016-12-19 DIAGNOSIS — Z5189 Encounter for other specified aftercare: Secondary | ICD-10-CM | POA: Diagnosis not present

## 2016-12-19 DIAGNOSIS — F1910 Other psychoactive substance abuse, uncomplicated: Secondary | ICD-10-CM

## 2016-12-19 DIAGNOSIS — Z95811 Presence of heart assist device: Secondary | ICD-10-CM

## 2016-12-19 DIAGNOSIS — I5022 Chronic systolic (congestive) heart failure: Secondary | ICD-10-CM

## 2016-12-19 DIAGNOSIS — N183 Chronic kidney disease, stage 3 unspecified: Secondary | ICD-10-CM

## 2016-12-19 DIAGNOSIS — I4729 Other ventricular tachycardia: Secondary | ICD-10-CM

## 2016-12-19 NOTE — Patient Instructions (Addendum)
Edward Mullen,  It was a pleasure to see you today. Please continue to take all of your medications as previously prescribed. Please continue to weigh yourself daily, you have been doing a great job. It is important that you continue to try and quit drinking for your heart. Please follow up with your cardiologist for your scheduled appointment in June. I would like for you to schedule a follow up appointment with your primary care doctor in the next 2-3 months. I will call you with the results of your blood work today. If you have any questions or concerns, call our clinic at [phone] or after hours call [phone] and ask for the internal medicine resident on call. Thank you!  - Dr. Philipp Ovens

## 2016-12-19 NOTE — Assessment & Plan Note (Addendum)
Creatinine at discharge was at his baseline, 1.39.  -- Repeat BMP today   ADDENDUM: Renal function stable, creatinine 1.3 GFR 58.

## 2016-12-19 NOTE — Assessment & Plan Note (Addendum)
Patient has a history of nonischemic cardiomyopathy with EF of 15-20%. He has a history of polysubstance abuse (alcohol, marijuana, cocaine) and is not a candidate for an ICD. Patient was set up with a LifeVest defibrillator on discharge which he is wearing today in clinic. He has a home health nurse who helps manage his medications. He was discharged on Lasix 40 mg daily, isosorbide-hydralazine 20-37.5 mg TID, Entresto 49-51 mg BID, and spironolactone 25 mg daily. He reports compliance. Per chart review, his dry weight is approximately 201lb. Weight on discharge was 191. Patient has been weighing himself daily at home and brought his log with him today. Since discharge, his weight has been steadily increasing. Weight today in clinic is 203. He appears euvolemic on exam and he denies shortness of breath. Instructed patient to continue weighing himself daily and to continue his current medication regimen. Patient knows to call us or his cardiologist if his weight increase to 5lbs over his dry weight.  -- Continue Lasix 40 mg daily -- Continue Isosorbide-hydralazing 20-37.5 mg TID -- Continue Entresto 49-51 mg BID -- Continue Spironolactone 25 mg daily  -- BMP today  -- F/u cardiology in June   ADDENDUM: Renal function stable/improving. Potassium 4.3.  Continue above regimen. Updated patient with results.

## 2016-12-19 NOTE — Assessment & Plan Note (Signed)
Patient reports he has not drank any alcohol since his discharge. Encourage patient to continue with cessation.

## 2016-12-19 NOTE — Telephone Encounter (Signed)
Discussed w/Dr Bensimhon, he advised pt to take Lasix 40 mg BID for 2 days.  Dee w/paramedicine is aware and will advise pt.

## 2016-12-19 NOTE — Assessment & Plan Note (Addendum)
Patient is here today for hospital follow-up. Patient presented to the ED on 12/13/2016 after syncopal episode. Patient reports he had just taken all of his medications and was drinking liquor when he suddenly passed out. He awoke in the ambulance on the way to the hospital. Patient was placed on telemetry on admission and had multiple episodes of nonsustained SVT (20+) during his stay. Cardiology was consulted who felt his syncope was likely secondary to VT. He was started on amiodarone 200 mg BID,  his digoxin dose was decreased, and his Coreg was discontinued per heart failure recommendations. Patient has a history of nonischemic cardiomyopathy with EF of 15-20%. Due to his substance abuse he is not a candidate for an ICD. On discharge he was set up with a LifeVest defibrillator which he is wearing today in the office. He denies any chest pain, heart palpitations, or any further syncopal episodes. He is scheduled to follow up with cardiology in June. -- Continue LifeVest -- Continue amiodarone 200 mg BID -- Continue digoxin .0625 mg daily  -- F/u Cardiology

## 2016-12-19 NOTE — Telephone Encounter (Signed)
Received call from [place] concerning patient having 3 lb weight gain overnight, and that patient does report drinking excess water and koolaid this past week. Weight trends in epic show 10-12 lb weight gain in 5 days. Saw IM today and was advised to continue current diuretic regimen as he appeared euvolemic on exam and denies SOB or any other s/s. Attempted to call patient, no answer, no ability to leave VM. Patient next apt end of June.ben Will forward to Dr. Haroldine Laws to advise.  Renee Pain, RN

## 2016-12-19 NOTE — Assessment & Plan Note (Addendum)
Normocytic and likely secondary to his CKD. Hemoglobin stable at discharge, at his baseline of 12. -- Repeat CBC today   ADDENDUM: Hemoglobin stable.

## 2016-12-19 NOTE — Progress Notes (Signed)
   CC: Hospital follow up  HPI:  [name] is a 61 y.o. male with past medical history outlined below here for hospital follow up. For the details of today's visit, please refer to the assessment and plan.  Past Medical History:  Diagnosis Date  . Arthritis    "feels like it in my legs" (09/20/2014)  . Borderline type 2 diabetes mellitus   . Hypertension     Review of Systems:  All pertinents listed in HPI, otherwise negative  Physical Exam:  Vitals:   12/19/16 1036  BP: 139/72  Pulse: 71  Temp: 97.9 F (36.6 C)  TempSrc: Oral  SpO2: 100%  Weight: 203 lb 11.2 oz (92.4 kg)    Constitutional: NAD, appears comfortable, wearing LifeVest  Cardiovascular: RRR, 3/6 systolic murmur at the apex Pulmonary/Chest: CTAB, no wheezes, rales, or rhonchi.  Abdominal: Soft, non tender, non distended. +BS.  Extremities: Warm and well perfused. No edema.  Neurological: A&Ox3, CN II - XII grossly intact.    Assessment & Plan:   See Encounters Tab for problem based charting.  Patient discussed with Dr. Daryll Drown

## 2016-12-20 LAB — BMP8+ANION GAP
Anion Gap: 15 mmol/L (ref 10.0–18.0)
BUN/Creatinine Ratio: 15 (ref 10–24)
BUN: 20 mg/dL (ref 8–27)
CO2: 23 mmol/L (ref 18–29)
Calcium: 9.1 mg/dL (ref 8.6–10.2)
Chloride: 104 mmol/L (ref 96–106)
Creatinine, Ser: 1.31 mg/dL — ABNORMAL HIGH (ref 0.76–1.27)
GFR calc Af Amer: 67 mL/min/{1.73_m2} (ref >59)
GFR calc non Af Amer: 58 mL/min/{1.73_m2} — ABNORMAL LOW (ref >59)
Glucose: 91 mg/dL (ref 65–99)
Potassium: 4.3 mmol/L (ref 3.5–5.2)
Sodium: 142 mmol/L (ref 134–144)

## 2016-12-20 LAB — CBC
Hematocrit: 39 % (ref 37.5–51.0)
Hemoglobin: 12.6 g/dL — ABNORMAL LOW (ref 13.0–17.7)
MCH: 27.2 pg (ref 26.6–33.0)
MCHC: 32.3 g/dL (ref 31.5–35.7)
MCV: 84 fL (ref 79–97)
Platelets: 198 10*3/uL (ref 150–379)
RBC: 4.63 x10E6/uL (ref 4.14–5.80)
RDW: 16.8 % — ABNORMAL HIGH (ref 12.3–15.4)
WBC: 6.2 10*3/uL (ref 3.4–10.8)

## 2016-12-24 NOTE — Progress Notes (Signed)
Internal Medicine Clinic Attending  Case discussed with Dr. Guilloud at the time of the visit.  We reviewed the resident's history and exam and pertinent patient test results.  I agree with the assessment, diagnosis, and plan of care documented in the resident's note.  

## 2016-12-25 ENCOUNTER — Other Ambulatory Visit (HOSPITAL_COMMUNITY): Payer: Self-pay

## 2016-12-25 NOTE — Progress Notes (Signed)
Paramedicine Encounter    Patient ID: Edward Mullen, male    DOB: [birth year], 61 y.o.   MRN: [id number]    Patient Care Team: Shela Leff, MD as PCP - General  Patient Active Problem List   Diagnosis Date Noted  . Anemia 12/19/2016  . CKD (chronic kidney disease) stage 3, GFR 30-59 ml/min 12/13/2016  . Chronic systolic heart failure ([place]) 09/06/2016  . NSVT (nonsustained ventricular tachycardia) ([place]) 09/06/2016  . Non-ischemic cardiomyopathy ([place]) 08/27/2016  . Non-obstructive hypertrophic cardiomyopathy ([place]) 08/27/2016  . Mitral regurgitation 08/27/2016  . Polysubstance abuse   . Blurred vision, bilateral 09/20/2014  . Tobacco abuse 09/19/2014  . HTN (hypertension) 03/28/2011  . Claudication of both lower extremities ([place]) 03/28/2011  . Erectile dysfunction 03/28/2011    Current Outpatient Prescriptions:  .  amiodarone (PACERONE) 200 MG tablet, Take 1 tablet (200 mg total) by mouth 2 (two) times daily., Disp: 60 tablet, Rfl: 0 .  aspirin 81 MG chewable tablet, Chew 1 tablet (81 mg total) by mouth daily., Disp: 90 tablet, Rfl: 3 .  atorvastatin (LIPITOR) 20 MG tablet, Take 1 tablet (20 mg total) by mouth daily at 6 PM., Disp: 90 tablet, Rfl: 3 .  digoxin (LANOXIN) 0.125 MG tablet, Take 0.5 tablets (0.0625 mg total) by mouth daily., Disp: 30 tablet, Rfl: 0 .  furosemide (LASIX) 40 MG tablet, Take 1 tablet (40 mg total) by mouth daily., Disp: 30 tablet, Rfl: 6 .  isosorbide-hydrALAZINE (BIDIL) 20-37.5 MG tablet, Take 1 tablet by mouth 3 (three) times daily., Disp: 90 tablet, Rfl: 6 .  sacubitril-valsartan (ENTRESTO) 49-51 MG, Take 1 tablet by mouth 2 (two) times daily., Disp: 60 tablet, Rfl: 11 .  spironolactone (ALDACTONE) 25 MG tablet, Take 1 tablet (25 mg total) by mouth daily., Disp: 90 tablet, Rfl: 3 No Known Allergies   Social History   Social History  . Marital status: Married    Spouse name: N/A  . Number of children: N/A  . 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 Smoking status: Current Every Day Smoker    Packs/day: 0.30    Years: 48.00    Types: Cigarettes  . Smokeless tobacco: Never Used     Comment: cutting back 2 per day  . Alcohol use 3.6 oz/week    6 Cans of beer per week  . Drug use: Yes    Types: Marijuana, Cocaine  . Sexual activity: Yes    Birth control/ protection: None   Other Topics Concern  . Not on file   Social History Narrative  . No narrative on file    Physical Exam  Pulmonary/Chest: No respiratory distress. He has no wheezes. He has no rales.  Abdominal: He exhibits no distension. There is no tenderness. There is no guarding.  Musculoskeletal: He exhibits no edema.  Skin: Skin is warm and dry. He is not diaphoretic.        Future Appointments Date Time Provider [place]  01/29/2017 11:20 AM [facility] CLINIC [facility] None    ATF pt CAO x4 sitting in the livingroom c/o hip pain. Pt had a weight increase on Monday.  Pt stated that he went to two  cook-outs on Sunday, he ate a lot of food but did not drink any alcohol. Pt stated that he did not pay much attention to  what he was eating. Pt did not take any extra medications.  He denies sob, dizziness, headache and chest pain.  Pt hasn't had any trouble sleeping.  Pt had completed his #1 pill box and is now working on the second box.  Pt has not missed any medications.  rx bottles and pil box verified.  Pt still has the "life vest" at home.  He denies syncope and dizzy spells since the last episode.   BP 118/70 (BP Location: Left Arm, Patient Position: Sitting, Cuff Size: Normal)   Pulse 70   Resp 16   Wt 200 lb (90.7 kg)   SpO2 99%   BMI 27.12 kg/m   Weight yesterday-198 Last visit weight-197    Gregor Dershem, EMT Paramedic 12/25/2016    ACTION: Home visit completed Next visit planned for next  wednesday

## 2017-01-01 ENCOUNTER — Other Ambulatory Visit (HOSPITAL_COMMUNITY): Payer: Self-pay

## 2017-01-01 NOTE — Progress Notes (Signed)
Paramedicine Encounter    Patient ID: Edward Mullen, male    DOB: [birth year], 61 y.o.   MRN: [id number]    Patient Care Team: Shela Leff, MD as PCP - General  Patient Active Problem List   Diagnosis Date Noted  . Anemia 12/19/2016  . CKD (chronic kidney disease) stage 3, GFR 30-59 ml/min 12/13/2016  . Chronic systolic heart failure ([place]) 09/06/2016  . NSVT (nonsustained ventricular tachycardia) ([place]) 09/06/2016  . Non-ischemic cardiomyopathy ([place]) 08/27/2016  . Non-obstructive hypertrophic cardiomyopathy ([place]) 08/27/2016  . Mitral regurgitation 08/27/2016  . Polysubstance abuse   . Blurred vision, bilateral 09/20/2014  . Tobacco abuse 09/19/2014  . HTN (hypertension) 03/28/2011  . Claudication of both lower extremities ([place]) 03/28/2011  . Erectile dysfunction 03/28/2011    Current Outpatient Prescriptions:  .  amiodarone (PACERONE) 200 MG tablet, Take 1 tablet (200 mg total) by mouth 2 (two) times daily., Disp: 60 tablet, Rfl: 0 .  atorvastatin (LIPITOR) 20 MG tablet, Take 1 tablet (20 mg total) by mouth daily at 6 PM., Disp: 90 tablet, Rfl: 3 .  digoxin (LANOXIN) 0.125 MG tablet, Take 0.5 tablets (0.0625 mg total) by mouth daily., Disp: 30 tablet, Rfl: 0 .  furosemide (LASIX) 40 MG tablet, Take 1 tablet (40 mg total) by mouth daily., Disp: 30 tablet, Rfl: 6 .  isosorbide-hydrALAZINE (BIDIL) 20-37.5 MG tablet, Take 1 tablet by mouth 3 (three) times daily., Disp: 90 tablet, Rfl: 6 .  sacubitril-valsartan (ENTRESTO) 49-51 MG, Take 1 tablet by mouth 2 (two) times daily., Disp: 60 tablet, Rfl: 11 .  spironolactone (ALDACTONE) 25 MG tablet, Take 1 tablet (25 mg total) by mouth daily., Disp: 90 tablet, Rfl: 3 .  aspirin 81 MG chewable tablet, Chew 1 tablet (81 mg total) by mouth daily., Disp: 90 tablet, Rfl: 3 No Known Allergies   Social History   Social History  . Marital status: Married    Spouse name: N/A  . Number of children: N/A  . 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 Smoking status: Current Every Day Smoker    Packs/day: 0.30    Years: 48.00    Types: Cigarettes  . Smokeless tobacco: Never Used     Comment: cutting back 2 per day  . Alcohol use 3.6 oz/week    6 Cans of beer per week  . Drug use: Yes    Types: Marijuana, Cocaine  . Sexual activity: Yes    Birth control/ protection: None   Other Topics Concern  . Not on file   Social History Narrative  . No narrative on file    Physical Exam  Eyes: Pupils are equal, round, and reactive to light.  Pulmonary/Chest: No respiratory distress. He has no wheezes. He has no rales.  Abdominal: He exhibits no distension. There is no tenderness. There is no guarding.  Musculoskeletal: He exhibits no edema.  Skin: Skin is warm and dry. He is not diaphoretic.        Future Appointments Date Time Provider [place]  01/29/2017 11:20 AM [facility] CLINIC [facility] None    ATF pt sitting in the livingroom with no complaints. Pt stated that he has not had any dizziness or syncopal episodes.  He hasn't felt ay palpations in weeks.  He denies sob, dizziness, headache and chest pain.  He admits to  drinking a beer yesterday to celebrate his daughters birthday.  Pt has taken all his medications w/o difficulty. The one day that he missed a dose, he took it when he got back home. He denies having issues urintating Pt stated that he was approved for SSI and he will began getting benefits next month.   Pt stated that he will get asa from the pharmacy today.  rx bottles verified and pill box refilled.    BP 118/80 (BP Location: Left Arm, Patient Position: Sitting, Cuff Size: Normal)   Pulse 70   Resp 16   Wt 201 lb (91.2 kg)   SpO2 98%   BMI 27.26 kg/m   **rx called in: amiodarone (filled until Thursday no pill in thurs) second box bidil (filled filled wed - fri  eve evening) second box fueosemide (filled until wed; no pill Wednesday) 1st box entresto (filled until wedn, thur, fri) second box   Weight yesterday-200 Last visit weight-200  Kennidee Heyne, EMT Paramedic 01/01/2017    ACTION: Home visit completed Next visit planned for next wednesday

## 2017-01-08 ENCOUNTER — Other Ambulatory Visit (HOSPITAL_COMMUNITY): Payer: Self-pay | Admitting: Pharmacist

## 2017-01-08 ENCOUNTER — Other Ambulatory Visit (HOSPITAL_COMMUNITY): Payer: Self-pay

## 2017-01-08 MED ORDER — AMIODARONE HCL 200 MG PO TABS: 200.0000 mg | ORAL_TABLET | Freq: Two times a day (BID) | ORAL | 11 refills | Status: DC

## 2017-01-08 NOTE — Progress Notes (Signed)
Paramedicine Encounter    Patient ID: Edward Mullen, male    DOB: [birth year], 61 y.o.   MRN: [id number]    Patient Care Team: Shela Leff, MD as PCP - General  Patient Active Problem List   Diagnosis Date Noted  . Anemia 12/19/2016  . CKD (chronic kidney disease) stage 3, GFR 30-59 ml/min 12/13/2016  . Chronic systolic heart failure ([place]) 09/06/2016  . NSVT (nonsustained ventricular tachycardia) ([place]) 09/06/2016  . Non-ischemic cardiomyopathy ([place]) 08/27/2016  . Non-obstructive hypertrophic cardiomyopathy ([place]) 08/27/2016  . Mitral regurgitation 08/27/2016  . Polysubstance abuse   . Blurred vision, bilateral 09/20/2014  . Tobacco abuse 09/19/2014  . HTN (hypertension) 03/28/2011  . Claudication of both lower extremities ([place]) 03/28/2011  . Erectile dysfunction 03/28/2011    Current Outpatient Prescriptions:  .  atorvastatin (LIPITOR) 20 MG tablet, Take 1 tablet (20 mg total) by mouth daily at 6 PM., Disp: 90 tablet, Rfl: 3 .  digoxin (LANOXIN) 0.125 MG tablet, Take 0.5 tablets (0.0625 mg total) by mouth daily., Disp: 30 tablet, Rfl: 0 .  furosemide (LASIX) 40 MG tablet, Take 1 tablet (40 mg total) by mouth daily., Disp: 30 tablet, Rfl: 6 .  isosorbide-hydrALAZINE (BIDIL) 20-37.5 MG tablet, Take 1 tablet by mouth 3 (three) times daily., Disp: 90 tablet, Rfl: 6 .  sacubitril-valsartan (ENTRESTO) 49-51 MG, Take 1 tablet by mouth 2 (two) times daily., Disp: 60 tablet, Rfl: 11 .  spironolactone (ALDACTONE) 25 MG tablet, Take 1 tablet (25 mg total) by mouth daily., Disp: 90 tablet, Rfl: 3 .  amiodarone (PACERONE) 200 MG tablet, Take 1 tablet (200 mg total) by mouth 2 (two) times daily., Disp: 60 tablet, Rfl: 0 .  aspirin 81 MG chewable tablet, Chew 1 tablet (81 mg total) by mouth daily., Disp: 90 tablet, Rfl: 3 No Known Allergies   Social History   Social History  . Marital status: Married    Spouse name: N/A  . Number of children: N/A  . 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 Smoking status: Current Every Day Smoker    Packs/day: 0.30    Years: 48.00    Types: Cigarettes  . Smokeless tobacco: Never Used     Comment: cutting back 2 per day  . Alcohol use 3.6 oz/week    6 Cans of beer per week  . Drug use: Yes    Types: Marijuana, Cocaine  . Sexual activity: Yes    Birth control/ protection: None   Other Topics Concern  . Not on file   Social History Narrative  . No narrative on file    Physical Exam  Eyes: Pupils are equal, round, and reactive to light.  Pulmonary/Chest: No respiratory distress. He has no wheezes. He has no rales.  Abdominal: He exhibits no distension. There is no tenderness. There is no guarding.  Musculoskeletal: He exhibits no edema.  Skin: Skin is warm and dry. He is not diaphoretic.        Future Appointments Date Time Provider [place]  01/29/2017 11:20 AM [facility] CLINIC [facility] None    ATF pt CAO x4 c/o hip pain (hx of chronic hip pain).  Pt stated that he has been taking tylenol arthoritis for the pain.  Pt hasn't had any trouble sleeping at night.  He stated that he has been eating  more now that he has gotten "his from [place]'.  He hasn't had any syncopal episodes or dizziness since the last time.    Theres no missed medication dosages for the week. Pt continues to weigh himself daily and records his weights.  He stated that he is still watching what he eats and drinks.  He hasn't had any alcohol recently.  Due to pt doing well with his diet and taking his medications I will begin seeing him twice a month.  He has two pill boxes that will be filled during [facility] visits.  rx bottles verified and pill box refilled.    BP 108/68 (BP Location: Left Arm, Patient Position: Sitting, Cuff Size: Normal)   Pulse 71   Resp 16   Wt 193 lb (87.5 kg)   SpO2 98%   BMI 26.18 kg/m   rx  called in: Amiodarone filled until Sunday/ no pills in mon-tues (1st box) nothing on second box   Weight yesterday-195 Last visit weight-201    Christohper Dube, EMT Paramedic 01/08/2017    ACTION: Home visit completed Next visit planned for two weeks from today 6/13

## 2017-01-13 DIAGNOSIS — Z736 Limitation of activities due to disability: Secondary | ICD-10-CM

## 2017-01-21 ENCOUNTER — Other Ambulatory Visit (HOSPITAL_COMMUNITY): Payer: Self-pay

## 2017-01-21 NOTE — Progress Notes (Signed)
Paramedicine Encounter    Patient ID: Edward Mullen, male    DOB: 1955-08-26, 61 y.o.   MRN: [id number]    Patient Care Team: Shela Leff, MD as PCP - General  Patient Active Problem List   Diagnosis Date Noted  . Anemia 12/19/2016  . CKD (chronic kidney disease) stage 3, GFR 30-59 ml/min 12/13/2016  . Chronic systolic heart failure ([place]) 09/06/2016  . NSVT (nonsustained ventricular tachycardia) ([place]) 09/06/2016  . Non-ischemic cardiomyopathy ([place]) 08/27/2016  . Non-obstructive hypertrophic cardiomyopathy ([place]) 08/27/2016  . Mitral regurgitation 08/27/2016  . Polysubstance abuse   . Blurred vision, bilateral 09/20/2014  . Tobacco abuse 09/19/2014  . HTN (hypertension) 03/28/2011  . Claudication of both lower extremities ([place]) 03/28/2011  . Erectile dysfunction 03/28/2011    Current Outpatient Prescriptions:  .  amiodarone (PACERONE) 200 MG tablet, Take 1 tablet (200 mg total) by mouth 2 (two) times daily., Disp: 60 tablet, Rfl: 11 .  aspirin 81 MG chewable tablet, Chew 1 tablet (81 mg total) by mouth daily., Disp: 90 tablet, Rfl: 3 .  atorvastatin (LIPITOR) 20 MG tablet, Take 1 tablet (20 mg total) by mouth daily at 6 PM., Disp: 90 tablet, Rfl: 3 .  digoxin (LANOXIN) 0.125 MG tablet, Take 0.5 tablets (0.0625 mg total) by mouth daily., Disp: 30 tablet, Rfl: 0 .  furosemide (LASIX) 40 MG tablet, Take 1 tablet (40 mg total) by mouth daily., Disp: 30 tablet, Rfl: 6 .  isosorbide-hydrALAZINE (BIDIL) 20-37.5 MG tablet, Take 1 tablet by mouth 3 (three) times daily., Disp: 90 tablet, Rfl: 6 .  sacubitril-valsartan (ENTRESTO) 49-51 MG, Take 1 tablet by mouth 2 (two) times daily., Disp: 60 tablet, Rfl: 11 .  spironolactone (ALDACTONE) 25 MG tablet, Take 1 tablet (25 mg total) by mouth daily., Disp: 90 tablet, Rfl: 3 No Known Allergies   Social History   Social History  . Marital status: Married    Spouse name: N/A  . Number of children: N/A  . 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 Smoking status: Current Every Day Smoker    Packs/day: 0.30    Years: 48.00    Types: Cigarettes  . Smokeless tobacco: Never Used     Comment: cutting back 2 per day  . Alcohol use 3.6 oz/week    6 Cans of beer per week  . Drug use: Yes    Types: Marijuana, Cocaine  . Sexual activity: Yes    Birth control/ protection: None   Other Topics Concern  . Not on file   Social History Narrative  . No narrative on file    Physical Exam  Eyes: Pupils are equal, round, and reactive to light.  Pulmonary/Chest: No respiratory distress. He has no wheezes. He has no rales.  Abdominal: He exhibits no distension. There is no tenderness. There is no guarding.  Skin: Skin is warm and dry. He is not diaphoretic.        Future Appointments Date Time Provider [place]  01/29/2017 11:20 AM [facility] CLINIC [facility] None    ATF pt CAOx4 sitting on the sofa with no complaints.  He stated that he had 1 beer about a week ago; no cigarettes but he's still smoking marijuana.  He took off the life alert last night "to get a good night of sleep".  I advised  him to keep it on.  He has been out of the amiodarone for about 11 days.  He denies palpations, sob, dizziness and chest pain.  I was able to pick them up prior to our visit.  Pt stated that he has been staying within the low sodium diet and fluid limit.  He has stated that he is still wanting to move out of his daughter's apartment, once he has saved up enough money for a deposit.  rx bottles verified and pill boxes refilled by [facility].  BP 118/78 (BP Location: Right Arm, Patient Position: Sitting, Cuff Size: Normal)   Pulse 89   Resp 16   Wt 198 lb (89.8 kg)   SpO2 98%   BMI 26.85 kg/m   Weight yesterday-196 Last visit weight-193    Jakim Drapeau, EMT Paramedic 01/21/2017    ACTION: Home  visit completed Next visit planned for 6/27

## 2017-01-29 ENCOUNTER — Other Ambulatory Visit (HOSPITAL_COMMUNITY): Payer: Self-pay

## 2017-01-29 ENCOUNTER — Ambulatory Visit (HOSPITAL_COMMUNITY)
Admission: RE | Admit: 2017-01-29 | Discharge: 2017-01-29 | Disposition: A | Discharge status: Home / Self Care | Payer: Medicaid Other | Source: Ambulatory Visit | Attending: Cardiology | Admitting: Cardiology

## 2017-01-29 ENCOUNTER — Encounter (HOSPITAL_COMMUNITY): Payer: Self-pay

## 2017-01-29 VITALS — BP 105/60 | HR 62 | Wt 201.8 lb

## 2017-01-29 DIAGNOSIS — Z87891 Personal history of nicotine dependence: Secondary | ICD-10-CM | POA: Insufficient documentation

## 2017-01-29 DIAGNOSIS — N183 Chronic kidney disease, stage 3 (moderate): Secondary | ICD-10-CM | POA: Insufficient documentation

## 2017-01-29 DIAGNOSIS — I5022 Chronic systolic (congestive) heart failure: Secondary | ICD-10-CM | POA: Diagnosis not present

## 2017-01-29 DIAGNOSIS — I739 Peripheral vascular disease, unspecified: Secondary | ICD-10-CM

## 2017-01-29 DIAGNOSIS — I429 Cardiomyopathy, unspecified: Secondary | ICD-10-CM | POA: Insufficient documentation

## 2017-01-29 DIAGNOSIS — I130 Hypertensive heart and chronic kidney disease with heart failure and stage 1 through stage 4 chronic kidney disease, or unspecified chronic kidney disease: Secondary | ICD-10-CM | POA: Insufficient documentation

## 2017-01-29 DIAGNOSIS — I2510 Atherosclerotic heart disease of native coronary artery without angina pectoris: Secondary | ICD-10-CM | POA: Diagnosis not present

## 2017-01-29 DIAGNOSIS — E1122 Type 2 diabetes mellitus with diabetic chronic kidney disease: Secondary | ICD-10-CM | POA: Insufficient documentation

## 2017-01-29 DIAGNOSIS — I472 Ventricular tachycardia: Secondary | ICD-10-CM | POA: Diagnosis not present

## 2017-01-29 DIAGNOSIS — R55 Syncope and collapse: Secondary | ICD-10-CM | POA: Diagnosis not present

## 2017-01-29 DIAGNOSIS — F1410 Cocaine abuse, uncomplicated: Secondary | ICD-10-CM | POA: Diagnosis not present

## 2017-01-29 DIAGNOSIS — F1910 Other psychoactive substance abuse, uncomplicated: Secondary | ICD-10-CM | POA: Diagnosis not present

## 2017-01-29 DIAGNOSIS — I340 Nonrheumatic mitral (valve) insufficiency: Secondary | ICD-10-CM | POA: Insufficient documentation

## 2017-01-29 DIAGNOSIS — Z7982 Long term (current) use of aspirin: Secondary | ICD-10-CM | POA: Diagnosis not present

## 2017-01-29 DIAGNOSIS — I4729 Other ventricular tachycardia: Secondary | ICD-10-CM

## 2017-01-29 LAB — COMPREHENSIVE METABOLIC PANEL
ALT: 37 U/L (ref 17–63)
AST: 42 U/L — ABNORMAL HIGH (ref 15–41)
Albumin: 4 g/dL (ref 3.5–5.0)
Alkaline Phosphatase: 114 U/L (ref 38–126)
Anion gap: 9 (ref 5–15)
BUN: 40 mg/dL — ABNORMAL HIGH (ref 6–20)
CO2: 22 mmol/L (ref 22–32)
Calcium: 9.2 mg/dL (ref 8.9–10.3)
Chloride: 107 mmol/L (ref 101–111)
Creatinine, Ser: 2.04 mg/dL — ABNORMAL HIGH (ref 0.61–1.24)
GFR calc Af Amer: 39 mL/min — ABNORMAL LOW (ref >60)
GFR calc non Af Amer: 33 mL/min — ABNORMAL LOW (ref >60)
Glucose, Bld: 94 mg/dL (ref 65–99)
Potassium: 4.4 mmol/L (ref 3.5–5.1)
Sodium: 138 mmol/L (ref 135–145)
Total Bilirubin: 0.4 mg/dL (ref 0.3–1.2)
Total Protein: 7.7 g/dL (ref 6.5–8.1)

## 2017-01-29 LAB — TSH: TSH: 0.694 u[IU]/mL (ref 0.350–4.500)

## 2017-01-29 MED ORDER — ROSUVASTATIN CALCIUM 5 MG PO TABS
5.0000 mg | ORAL_TABLET | Freq: Every day | ORAL | 3 refills | Status: DC
Start: 2017-01-29 — End: 2017-02-13

## 2017-01-29 MED ORDER — AMIODARONE HCL 200 MG PO TABS: 200.0000 mg | ORAL_TABLET | Freq: Every day | ORAL | 11 refills | Status: DC

## 2017-01-29 NOTE — Patient Instructions (Addendum)
Stop Atorvastatin, 1 week later: Start Crestor 5 mg daily   Decrease Amiodarone to 200 mg daily  Labs today  Your physician has requested that you have an echocardiogram. Echocardiography is a painless test that uses sound waves to create images of your heart. It provides your doctor with information about the size and shape of your heart and how well your heart's chambers and valves are working. This procedure takes approximately one hour. There are no restrictions for this procedure.  [place]  Your physician has requested that you have an ankle brachial index (ABI). During this test an ultrasound and blood pressure cuff are used to evaluate the arteries that supply the arms and legs with blood. Allow thirty minutes for this exam. There are no restrictions or special instructions.  Your physician has requested that you have a lower or upper extremity arterial duplex. This test is an ultrasound of the arteries in the legs or arms. It looks at arterial blood flow in the legs and arms. Allow one hour for Lower and Upper Arterial scans. There are no restrictions or special instructions  Your physician recommends that you schedule a follow-up appointment in: August

## 2017-01-29 NOTE — Progress Notes (Signed)
Advanced Heart Failure Clinic Note   Primary Care: Dr. Marlowe Sax Primary Cardiologist: Dr. Aundra Dubin   HPI: Edward Mullen is a 61 y.o. male with history of HTN, DM, chronic systolic CHF, and polysubstance abuse.   Admitted 08/23/16 with acute systolic CHF in setting of substance abuse. Echo was done, showing EF 15-20%.  Diuresed with IV lasix and meds adjusted as tolerated.  UDS + for cocaine on admission. Cardiac cath showed coronary disease but not significant enough to explain cardiomyopathy. Down 23 lbs from admission weight with diuresis.  Discharge weight 193 lbs.  He was positive for cocaine again in 2/18.    He was admitted with syncope in 5/18 after using cocaine.  He was noted to have NSVT on telemetry.  Syncope suspected due to VT, no ICD given active substance abuse.  He is now wearing a Lifevest and amiodarone was started.   He presents today for followup.  Taking all his meds, followed by paramedicine.  Generally feeling well, riding a bike.  He is no longer smoking cigarettes, but he last used cocaine last Friday.  No ETOH. He is wearing his Lifevest.  No exertional dyspnea on flat ground or walking up stairs.  No chest pain. No lightheadedness/syncope/palpitations.  He has bilateral leg cramps that have been common, not related to exertion.   Labs (1/18): Digoxin 0.4, BNP 961 Labs (2/18): K 4, creatinine 1.38 Labs (3/18): K 4.4, creatinine 1.85, digoxin 0.4 Labs (5/18): hgb 12.6, K 4.3, creatinine 1.3, digoxin 0.5  ROS: All other systems reviewed and negative except for as mentioning in HPI, Problem List, or Assessment and Plan.   Social History: Lives in [place] with daughter. Prior heavy ETOH, has quit.  Prior smoker, has quit. Still using cocaine.  Active marijuana.   Family History  Problem Relation Age of Onset  . Stroke Mother 38  . Heart disease Father 47   Past Medical History: 1. HTN: Not on any meds at home.  2. Type II diabetes 3. Cocaine abuse 4.  Active smoking, suspect COPD.  5. Cardiomyopathy: Nonischemic.  - Echo (1/18) with EF 15-20%, moderate LVH, moderate AI, moderate to severe MR, normal RV size with mildly decreased systolic function, PASP 44 mmHg.  - LHC/RHC (1/18): 80% stenosis in PLV branch.  Mean RA 5, PA 47/20 mean 32, mean PCWP 22, CI 2.01.  6. CAD: LHC (1/18) with 80% stenosis in a branch of the PLV (not enough CAD to explain cardiomyopathy).  7. Mitral regurgitation: Moderate to severe on 1/18 echo, probably functional.  8. CKD: Stage 3.  9. Syncope: 5/18 in setting of cocaine use, concern for VT.    Current Outpatient Prescriptions  Medication Sig Dispense Refill  . amiodarone (PACERONE) 200 MG tablet Take 1 tablet (200 mg total) by mouth daily. 60 tablet 11  . aspirin 81 MG chewable tablet Chew 1 tablet (81 mg total) by mouth daily. 90 tablet 3  . digoxin (LANOXIN) 0.125 MG tablet Take 0.5 tablets (0.0625 mg total) by mouth daily. 30 tablet 0  . furosemide (LASIX) 40 MG tablet Take 1 tablet (40 mg total) by mouth daily. 30 tablet 6  . isosorbide-hydrALAZINE (BIDIL) 20-37.5 MG tablet Take 1 tablet by mouth 3 (three) times daily. 90 tablet 6  . sacubitril-valsartan (ENTRESTO) 49-51 MG Take 1 tablet by mouth 2 (two) times daily. 60 tablet 11  . spironolactone (ALDACTONE) 25 MG tablet Take 1 tablet (25 mg total) by mouth daily. 90 tablet 3  . rosuvastatin (CRESTOR)  5 MG tablet Take 1 tablet (5 mg total) by mouth daily. 30 tablet 3   No current facility-administered medications for this encounter.     No Known Allergies    Vitals:   01/29/17 1126  BP: 105/60  Pulse: 62  SpO2: 100%  Weight: 201 lb 12 oz (91.5 kg)   Wt Readings from Last 3 Encounters:  01/29/17 201 lb 12 oz (91.5 kg)  01/21/17 198 lb (89.8 kg)  01/08/17 193 lb (87.5 kg)     PHYSICAL EXAM: General:  NAD.  HEENT: Normal Neck: supple. JVP not elevated. Carotids 2+ bilat; no bruits. No lymphadenopathy or thyromegaly appreciated. Cor: PMI  nondisplaced. RRR. 1/6 HSM heard best at apex.  Difficult to palpate pedal pulses.  Lungs: CTAB Abdomen: soft, NT, ND, no HSM. No bruits or masses. +BS  Extremities: no cyanosis, clubbing, rash. No edema.  Neuro: alert & oriented x 3, cranial nerves grossly intact. moves all 4 extremities w/o difficulty. Affect pleasant.  ASSESSMENT & PLAN:  1. Chronic systolic CHF: EF 63-14% with moderate to severe MR on echo 1/18. Nonischemic cardiomyopathy. HTN vs cocaine use vs viral vs ETOH. HIV negative, SPEP with no M-spike. Patient does have CAD but not enough to explain cardiomyopathy.  NYHA class II symptoms and not volume overloaded on exam.  Soft BP, not much room to titrate meds.  - Continue Lasix 40 mg daily. Can take extra 40 mg as needed for weight gain of three lbs overnight or 5 lbs within one week. BMET today.  - Continue spironolactone 25 daily.   - Continue Coreg 9.375 mg bid. Explained again the risk of interaction with cocaine, although Coreg also has alpha blocking effect.  Pt again verbalized awareness.  - Continue digoxin 0.125 daily. Recent level ok.  - Continue Entresto 49/51 bid.  - Continue Bidil 1 tab tid.   - Continue to avoid cocaine and ETOH.   - Narrow QRS, not CRT candidate.  - Patient used cocaine last Friday.  He is not a candidate yet for ICD given ongoing substance abuse.  He will continue to wear his Lifevest and take amiodarone and I will have him repeat echo in 8/18 at followup.   2. HTN: BP controlled.    3. Polysubstance abuse:  As above, extensive counseling about cocaine abstinence.    4. NSVT: Short runs in hospital.  Concern that VT caused 5/18 syncope.  - Decrease amiodarone to 200 mg daily.  Check LFTs, TSH.  He will need regular eye exam while on amiodarone.  5. Smoking: He has quit.  6. CKD: Stage 3.  BMET today.  7. Leg cramps: Not with activity.  I will arrange for ABIs.  I will also try switching his statin from atorvastatin to Crestor 5 mg daily to  see if this helps his cramps/pain.   Followup in 8/18 with echo.   Loralie Champagne, MD 01/29/17

## 2017-01-29 NOTE — Progress Notes (Signed)
Paramedicine Encounter   Patient ID: Edward Mullen , male,   DOB: 04-22-1956,60 y.o.,  MRN: [id number]   Met patient in clinic today with provider [place].  Upon arrival pt was finishing up his appointment and walking in the hall to schedule next appointment.  Pt advised me that he was taken off of atorvastn and placed on crestor.  He was instructed to stop atovastain and begin crestor after a week.  Pt stated that he understood the instructions.  Amiodarone was decreased to 230m daily instead of 20103mtwice daily.  Pt stated that he has hip and leg pain (hx of same). Pt stated he feels pretty good today.    Pt stated that he wanted to move from his daughter apartment to his own apartment.    Time spent with patient 15 mins  Lataya Varnell, EMT-Paramedic 01/29/2017   ACTION: Next visit planned for next wednesday

## 2017-01-31 ENCOUNTER — Telehealth (HOSPITAL_COMMUNITY): Payer: Self-pay | Admitting: *Deleted

## 2017-01-31 ENCOUNTER — Encounter (HOSPITAL_COMMUNITY): Payer: Self-pay | Admitting: *Deleted

## 2017-01-31 NOTE — Telephone Encounter (Signed)
Status:  Final result Visible to patient:  No (Not Released) Dx:  Chronic systolic heart failure ([place])  Notes recorded by Kennieth Rad, RN on 01/31/2017 at 8:47 AM EDT Called both lines again this morning with no answer. Letter mailed today. ------  Notes recorded by Scarlette Calico, RN on 01/30/2017 at 5:01 PM EDT Attempted to call pt several times today on both numbers listed, no answer and VM on either number ------  Notes recorded by Scarlette Calico, RN on 01/29/2017 at 5:20 PM EDT Attempted to call pt twice, no answer and no VM ------  Notes recorded by Larey Dresser, MD on 01/29/2017 at 4:53 PM EDT Hold Lasix x 2 days then decrease dose to 20 mg daily. Repeat BMET 1 week. Please call today.

## 2017-02-06 ENCOUNTER — Telehealth (HOSPITAL_COMMUNITY): Payer: Self-pay | Admitting: *Deleted

## 2017-02-06 MED ORDER — FUROSEMIDE 40 MG PO TABS: 20.0000 mg | ORAL_TABLET | Freq: Every day | ORAL | 6 refills | Status: DC

## 2017-02-06 NOTE — Telephone Encounter (Signed)
Pt received letter in the mail and called back for lab results, advised of Dr Claris Gladden recommendation: Notes recorded by Larey Dresser, MD on 01/29/2017 at 4:53 PM EDT Hold Lasix x 2 days then decrease dose to 20 mg daily. Repeat BMET 1 week. Please call today.  Pt is agreeable and verbalizes understanding.  He states he will call back to sch lab appt

## 2017-02-13 ENCOUNTER — Other Ambulatory Visit (HOSPITAL_COMMUNITY): Payer: Self-pay

## 2017-02-13 MED ORDER — ROSUVASTATIN CALCIUM 5 MG PO TABS: 5.0000 mg | ORAL_TABLET | Freq: Every day | ORAL | 3 refills | Status: DC

## 2017-02-13 NOTE — Progress Notes (Signed)
Paramedicine Encounter    Patient ID: Edward Mullen, male    DOB: 01-04-1956, 61 y.o.   MRN: [id number]    Patient Care Team: Edward Leff, MD as PCP - General  Patient Active Problem List   Diagnosis Date Noted  . Anemia 12/19/2016  . CKD (chronic kidney disease) stage 3, GFR 30-59 ml/min 12/13/2016  . Chronic systolic heart failure ([place]) 09/06/2016  . NSVT (nonsustained ventricular tachycardia) ([place]) 09/06/2016  . Non-ischemic cardiomyopathy ([place]) 08/27/2016  . Non-obstructive hypertrophic cardiomyopathy ([place]) 08/27/2016  . Mitral regurgitation 08/27/2016  . Polysubstance abuse   . Blurred vision, bilateral 09/20/2014  . Tobacco abuse 09/19/2014  . HTN (hypertension) 03/28/2011  . Claudication of both lower extremities ([place]) 03/28/2011  . Erectile dysfunction 03/28/2011    Current Outpatient Prescriptions:  .  amiodarone (PACERONE) 200 MG tablet, Take 1 tablet (200 mg total) by mouth daily., Disp: 60 tablet, Rfl: 11 .  aspirin 81 MG chewable tablet, Chew 1 tablet (81 mg total) by mouth daily., Disp: 90 tablet, Rfl: 3 .  digoxin (LANOXIN) 0.125 MG tablet, Take 0.5 tablets (0.0625 mg total) by mouth daily., Disp: 30 tablet, Rfl: 0 .  furosemide (LASIX) 40 MG tablet, Take 0.5 tablets (20 mg total) by mouth daily., Disp: 30 tablet, Rfl: 6 .  isosorbide-hydrALAZINE (BIDIL) 20-37.5 MG tablet, Take 1 tablet by mouth 3 (three) times daily., Disp: 90 tablet, Rfl: 6 .  rosuvastatin (CRESTOR) 5 MG tablet, Take 1 tablet (5 mg total) by mouth daily., Disp: 30 tablet, Rfl: 3 .  sacubitril-valsartan (ENTRESTO) 49-51 MG, Take 1 tablet by mouth 2 (two) times daily., Disp: 60 tablet, Rfl: 11 .  spironolactone (ALDACTONE) 25 MG tablet, Take 1 tablet (25 mg total) by mouth daily., Disp: 90 tablet, Rfl: 3 No Known Allergies   Social History   Social History  . Marital status: Married    Spouse name: N/A  . Number of children: N/A  . 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 Smoking status: Current Every Day Smoker    Packs/day: 0.30    Years: 48.00    Types: Cigarettes  . Smokeless tobacco: Never Used     Comment: cutting back 2 per day  . Alcohol use 3.6 oz/week    6 Cans of beer per week  . Drug use: Yes    Types: Marijuana, Cocaine  . Sexual activity: Yes    Birth control/ protection: None   Other Topics Concern  . Not on file   Social History Narrative  . No narrative on file    Physical Exam  Pulmonary/Chest: No respiratory distress. He has no wheezes. He has no rales.  Abdominal: He exhibits no distension. There is no tenderness. There is no guarding.  Musculoskeletal: He exhibits no edema.  Skin: Skin is warm and dry. He is not diaphoretic.        Future Appointments Date Time Provider [place]  02/27/2017 1:30 PM MC-[facility] NL VASC 4 [facility]SE[facility]I [facility]  04/07/2017 10:00 AM MC ECHO 1-BUZZ [facility]ECH[facility]  04/07/2017 11:00 AM Larey Dresser, MD [facility] None    ATF pt CAO x4 with no complaints.  Pt stated that he slept with the life pack on last night.  He denies sob, headache, dizziness and chest pain.  Pt also denies feeling chest palpations.  He was able to get all of his prescriptions filled today.  Pt hasn't missed any medication doses and he has been weighing himself daily (plus recording the results).  rx bottles verified and both pill boxes refilled.   BP 100/70 (BP Location: Left Arm, Patient Position: Sitting)   Pulse 68   Resp 16   Wt 200 lb (90.7 kg)   SpO2 98%   BMI 27.12 kg/m    **rx called in: fursemide filed tues/no pill in wed 2nd box Digoxin  Weight yesterday-198 Last visit weight-    Edward Mullen, EMT Paramedic 02/13/2017    ACTION: Home visit completed

## 2017-02-27 ENCOUNTER — Other Ambulatory Visit (HOSPITAL_COMMUNITY): Payer: Self-pay

## 2017-02-27 ENCOUNTER — Encounter (HOSPITAL_COMMUNITY): Payer: Medicaid Other

## 2017-02-27 ENCOUNTER — Ambulatory Visit (HOSPITAL_COMMUNITY)
Admission: RE | Admit: 2017-02-27 | Discharge: 2017-02-27 | Disposition: A | Discharge status: Home / Self Care | Payer: Medicaid Other | Source: Ambulatory Visit | Attending: Cardiology | Admitting: Cardiology

## 2017-02-27 DIAGNOSIS — R9439 Abnormal result of other cardiovascular function study: Secondary | ICD-10-CM | POA: Insufficient documentation

## 2017-02-27 DIAGNOSIS — I739 Peripheral vascular disease, unspecified: Secondary | ICD-10-CM | POA: Insufficient documentation

## 2017-02-27 NOTE — Progress Notes (Signed)
Paramedicine Encounter    Patient ID: Edward Mullen, male    DOB: 25-May-1956, 61 y.o.   MRN: [id number]    Patient Care Team: Shela Leff, MD as PCP - General  Patient Active Problem List   Diagnosis Date Noted  . Anemia 12/19/2016  . CKD (chronic kidney disease) stage 3, GFR 30-59 ml/min 12/13/2016  . Chronic systolic heart failure ([place]) 09/06/2016  . NSVT (nonsustained ventricular tachycardia) ([place]) 09/06/2016  . Non-ischemic cardiomyopathy ([place]) 08/27/2016  . Non-obstructive hypertrophic cardiomyopathy ([place]) 08/27/2016  . Mitral regurgitation 08/27/2016  . Polysubstance abuse   . Blurred vision, bilateral 09/20/2014  . Tobacco abuse 09/19/2014  . HTN (hypertension) 03/28/2011  . Claudication of both lower extremities ([place]) 03/28/2011  . Erectile dysfunction 03/28/2011    Current Outpatient Prescriptions:  .  amiodarone (PACERONE) 200 MG tablet, Take 1 tablet (200 mg total) by mouth daily., Disp: 60 tablet, Rfl: 11 .  aspirin 81 MG chewable tablet, Chew 1 tablet (81 mg total) by mouth daily., Disp: 90 tablet, Rfl: 3 .  digoxin (LANOXIN) 0.125 MG tablet, Take 0.5 tablets (0.0625 mg total) by mouth daily., Disp: 30 tablet, Rfl: 0 .  furosemide (LASIX) 40 MG tablet, Take 0.5 tablets (20 mg total) by mouth daily., Disp: 30 tablet, Rfl: 6 .  isosorbide-hydrALAZINE (BIDIL) 20-37.5 MG tablet, Take 1 tablet by mouth 3 (three) times daily., Disp: 90 tablet, Rfl: 6 .  rosuvastatin (CRESTOR) 5 MG tablet, Take 1 tablet (5 mg total) by mouth daily., Disp: 30 tablet, Rfl: 3 .  sacubitril-valsartan (ENTRESTO) 49-51 MG, Take 1 tablet by mouth 2 (two) times daily., Disp: 60 tablet, Rfl: 11 .  spironolactone (ALDACTONE) 25 MG tablet, Take 1 tablet (25 mg total) by mouth daily., Disp: 90 tablet, Rfl: 3 No Known Allergies   Social History   Social History  . Marital status: Married    Spouse name: N/A  . Number of children: N/A  . 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 Smoking status: Current Every Day Smoker    Packs/day: 0.30    Years: 48.00    Types: Cigarettes  . Smokeless tobacco: Never Used     Comment: cutting back 2 per day  . Alcohol use 3.6 oz/week    6 Cans of beer per week  . Drug use: Yes    Types: Marijuana, Cocaine  . Sexual activity: Yes    Birth control/ protection: None   Other Topics Concern  . Not on file   Social History Narrative  . No narrative on file    Physical Exam  Pulmonary/Chest: No respiratory distress. He has no wheezes. He has no rales.  Abdominal: He exhibits no distension. There is no rebound and no guarding.  Musculoskeletal: He exhibits no edema.  Skin: Skin is warm and dry. He is not diaphoretic.        Future Appointments Date Time Provider [place]  02/27/2017 1:30 PM MC-CV NL VASC 4 [facility]-SECVI [facility]NL  04/07/2017 10:00 AM MC ECHO 1-BUZZ [facility]ECHOLAB [facility]  04/07/2017 11:00 AM Larey Dresser, MD MC-HVSC None    ATF pt CAO x4 with no complaints. Pt has an appointment today with imaging and another one with [facility].  Pt stated that he still has leg/hip pain from time to time.  He denies dizziness, syncope, headach  and chest pain since our last visit.  Pt stated that he has been feeling good enough to ride a bicycle a few times this week, he has also been a lot more walking for excersise.  He is currently looking for his own apartment.  rx bottles verified and pill box refilled.   There were no vitals taken for this visit.  Weight yesterday-202 Last visit weight-200  Call in 7/27  bidil [id number] Rosuvastatin [id number] entresto [id number]  Kimberley Speece, EMT Paramedic 02/27/2017    ACTION: Home visit completed Next visit planned for in two weeks

## 2017-03-18 ENCOUNTER — Ambulatory Visit (INDEPENDENT_AMBULATORY_CARE_PROVIDER_SITE_OTHER): Payer: Medicaid Other | Admitting: Internal Medicine

## 2017-03-18 VITALS — BP 122/71 | HR 66 | Temp 97.8°F | Wt 207.3 lb

## 2017-03-18 DIAGNOSIS — Z79899 Other long term (current) drug therapy: Secondary | ICD-10-CM

## 2017-03-18 DIAGNOSIS — M79604 Pain in right leg: Secondary | ICD-10-CM | POA: Diagnosis not present

## 2017-03-18 DIAGNOSIS — M79605 Pain in left leg: Secondary | ICD-10-CM

## 2017-03-18 DIAGNOSIS — F17210 Nicotine dependence, cigarettes, uncomplicated: Secondary | ICD-10-CM | POA: Diagnosis not present

## 2017-03-18 MED ORDER — GABAPENTIN 300 MG PO CAPS: 300.0000 mg | ORAL_CAPSULE | Freq: Two times a day (BID) | ORAL | 0 refills | Status: DC

## 2017-03-18 NOTE — Patient Instructions (Signed)
[name] it was nice seeing you today.  -Start taking gabapentin 300 mg twice daily. This medication may make you drowsy. As such, do not drive or operate heavy machinery while you're taking this medication.  -I have ordered an MRI of your lower back.  -Please return to the clinic in 1 month.

## 2017-03-18 NOTE — Progress Notes (Signed)
   CC: Patient is here to discuss his chronic bilateral lower extremity pain.  HPI:  [name] is a 61 y.o. male with a past medical history of conditions listed below presenting to clinic to discuss his chronic bilateral lower extremity pain. Please see problem based charting for the status of the patient's current and chronic medical conditions.   Past Medical History:  Diagnosis Date  . Arthritis    "feels like it in my legs" (09/20/2014)  . Borderline type 2 diabetes mellitus   . Hypertension    Review of Systems: Pertinent positives mentioned in HPI. Remainder of all ROS negative.   Physical Exam:  Vitals:   03/18/17 0945  BP: 122/71  Pulse: 66  Temp: 97.8 F (36.6 C)  TempSrc: Oral  SpO2: 100%  Weight: 207 lb 4.8 oz (94 kg)   Physical Exam  Constitutional: He is oriented to person, place, and time. He appears well-developed and well-nourished. No distress.  HENT:  Head: Normocephalic and atraumatic.  Eyes: Right eye exhibits no discharge. Left eye exhibits no discharge.  Cardiovascular: Normal rate, regular rhythm and intact distal pulses.   Pulmonary/Chest: Effort normal and breath sounds normal. No respiratory distress. He has no wheezes. He has no rales.  Abdominal: Soft. Bowel sounds are normal. He exhibits no distension. There is no tenderness.  Musculoskeletal: He exhibits no edema.  Spine nontender to palpation. Paraspinal muscles nontender to palpation and did not feel tight. Straight leg raise test negative bilaterally. Sensation to light touch grossly intact and strength 5 out of 5 in bilateral lower extremities.  Neurological: He is alert and oriented to person, place, and time.  Skin: Skin is warm and dry.    Assessment & Plan:   See Encounters Tab for problem based charting.  Patient discussed with Dr. Lynnae January

## 2017-03-18 NOTE — Assessment & Plan Note (Addendum)
History of present illness Patient reports having bilateral lower extremity pain for several years. States he has pain and a burning sensation radiating from his lower back to his buttocks and then down the backside of his legs bilaterally. Pain is 8-9 out of 10 in intensity and present every day. States pain is better when he walks slightly bent forward. Also reports having numbness and tingling in his toes. Denies having any fevers, chills, or history of cancer. Denies having any urinary or fecal incontinence. Denies having saddle anesthesia. Reports having occasional straining with urination.  Assessment Per prior Epic notes, patient seems to have this problem for several years now. It was previously thought that his symptoms were possibly due to claudication in the setting of uncontrolled hypertension and tobacco use but ABIs done recently on 02/27/2017 were normal (LLE 1.31 and RLE 1.32). Patient's bilateral lower extremity pain and paresthesias are likely related to lumbar radiculopathy. Spinal stenosis is also on the differential as pain is alleviated when bending forward. Disc herniation less likely a straight leg raise test was negative bilaterally. Epidural abscess and vertebral osteomyelitis less likely as his complaint is chronic and patient is not having any fevers or chills. Although patient mentions occasional straining with urination, malignancy such as prostate cancer less likely as again his back pain is chronic and he has not experienced any unintentional weight loss. Weight has been stable for the past 8 months. Cauda equina syndrome less likely as patient is not having any incontinence or saddle anesthesia. Sensation to light touch grossly intact and strength 5 out of 5 in bilateral lower extremities on exam.  Plan -Gabapentin 300 mg twice daily for neuropathic pain. Dosing based on creatinine clearance. -Lumbar spine MRI without contrast -Return to clinic in 1 month

## 2017-03-18 NOTE — Progress Notes (Signed)
Internal Medicine Clinic Attending  Case discussed with Dr. Rathoreat the time of the visit. We reviewed the resident's history and exam and pertinent patient test results. I agree with the assessment, diagnosis, and plan of care documented in the resident's note.  

## 2017-03-19 ENCOUNTER — Other Ambulatory Visit (HOSPITAL_COMMUNITY): Payer: Self-pay

## 2017-03-19 NOTE — Progress Notes (Signed)
Paramedicine Encounter    Patient ID: Edward Mullen, male    DOB: August 04, 1956, 61 y.o.   MRN: [id number]    Patient Care Team: Shela Leff, MD as PCP - General  Patient Active Problem List   Diagnosis Date Noted  . Anemia 12/19/2016  . CKD (chronic kidney disease) stage 3, GFR 30-59 ml/min 12/13/2016  . Chronic systolic heart failure ([place]) 09/06/2016  . NSVT (nonsustained ventricular tachycardia) ([place]) 09/06/2016  . Non-ischemic cardiomyopathy ([place]) 08/27/2016  . Non-obstructive hypertrophic cardiomyopathy ([place]) 08/27/2016  . Mitral regurgitation 08/27/2016  . Polysubstance abuse   . Blurred vision, bilateral 09/20/2014  . Tobacco abuse 09/19/2014  . HTN (hypertension) 03/28/2011  . Bilateral lower extremity pain 03/28/2011  . Erectile dysfunction 03/28/2011    Current Outpatient Prescriptions:  .  amiodarone (PACERONE) 200 MG tablet, Take 1 tablet (200 mg total) by mouth daily., Disp: 60 tablet, Rfl: 11 .  aspirin 81 MG chewable tablet, Chew 1 tablet (81 mg total) by mouth daily., Disp: 90 tablet, Rfl: 3 .  digoxin (LANOXIN) 0.125 MG tablet, Take 0.5 tablets (0.0625 mg total) by mouth daily., Disp: 30 tablet, Rfl: 0 .  furosemide (LASIX) 40 MG tablet, Take 0.5 tablets (20 mg total) by mouth daily., Disp: 30 tablet, Rfl: 6 .  gabapentin (NEURONTIN) 300 MG capsule, Take 1 capsule (300 mg total) by mouth 2 (two) times daily., Disp: 180 capsule, Rfl: 0 .  isosorbide-hydrALAZINE (BIDIL) 20-37.5 MG tablet, Take 1 tablet by mouth 3 (three) times daily., Disp: 90 tablet, Rfl: 6 .  rosuvastatin (CRESTOR) 5 MG tablet, Take 1 tablet (5 mg total) by mouth daily., Disp: 30 tablet, Rfl: 3 .  sacubitril-valsartan (ENTRESTO) 49-51 MG, Take 1 tablet by mouth 2 (two) times daily., Disp: 60 tablet, Rfl: 11 .  spironolactone (ALDACTONE) 25 MG tablet, Take 1 tablet (25 mg total) by mouth daily., Disp: 90 tablet, Rfl: 3 No Known Allergies   Social History   Social History  . Marital  status: Married    Spouse name: N/A  . Number of children: N/A  . 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 Smoking status: Current Every Day Smoker    Packs/day: 0.30    Years: 48.00    Types: Cigarettes  . Smokeless tobacco: Never Used     Comment: cutting back 2 per day  . Alcohol use 3.6 oz/week    6 Cans of beer per week  . Drug use: Yes    Types: Marijuana, Cocaine  . Sexual activity: Yes    Birth control/ protection: None   Other Topics Concern  . Not on file   Social History Narrative  . No narrative on file    Physical Exam  Pulmonary/Chest: No respiratory distress. He has no wheezes. He has no rales.  Abdominal: He exhibits no distension. There is no guarding.  Musculoskeletal: He exhibits no edema.  Skin: Skin is warm and dry. He is not diaphoretic.        Future Appointments Date Time Provider [place]  04/07/2017 10:00 AM [facility] ECHO 1-BUZZ [facility]ECHOLAB [facility]  04/07/2017 11:00 AM Larey Dresser, MD MC-HVSC None  04/15/2017 2:45 PM Shela Leff, MD IMP-IMCR [facility]    ATF pt CAO x4 sitting on the couch watching tv c/o tingling in his toes and legs. He stated that  his PCP prescribed him gabapentin for nerve pain.  He denies sob, dizziness, headache and chest pain.  Pt has put an application into [facility] for section 8 and public housing.  He stated that his daughter is taking most of his money at the beginning of the month.  He couldn't afford his meds today therefore I paid for them.  Pt has taken his meds without difficulty. rx bottles verified and pill box refilled.   **rx called in: ASA until sun mon 2nd box entresto filled until sat am 1st box none in 2nd box [place]  Pt request [facility] delivery but [facility] charges $2.99 per delivery.  BP 124/82 (BP Location: Left Arm, Patient Position: Sitting, Cuff Size: Normal)   Pulse 63   Wt 201 lb  (91.2 kg)   SpO2 98%   BMI 27.26 kg/m   Weight yesterday-202 Last visit weight-201   Machai Desmith, EMT Paramedic 03/19/2017   ACTION: Home visit completed

## 2017-04-03 ENCOUNTER — Encounter (HOSPITAL_COMMUNITY): Payer: Self-pay

## 2017-04-03 NOTE — Progress Notes (Signed)
Renewal order for lifevest faxed for 3 months renewal to provided fax #

## 2017-04-07 ENCOUNTER — Ambulatory Visit (HOSPITAL_BASED_OUTPATIENT_CLINIC_OR_DEPARTMENT_OTHER)
Admission: RE | Admit: 2017-04-07 | Discharge: 2017-04-07 | Disposition: A | Discharge status: Home / Self Care | Payer: Medicaid Other | Source: Ambulatory Visit | Attending: Cardiology | Admitting: Cardiology

## 2017-04-07 ENCOUNTER — Ambulatory Visit (HOSPITAL_COMMUNITY)
Admission: RE | Admit: 2017-04-07 | Discharge: 2017-04-07 | Disposition: A | Discharge status: Home / Self Care | Payer: Medicaid Other | Source: Ambulatory Visit | Attending: Internal Medicine | Admitting: Internal Medicine

## 2017-04-07 ENCOUNTER — Encounter (HOSPITAL_COMMUNITY): Payer: Self-pay | Admitting: Cardiology

## 2017-04-07 VITALS — BP 149/84 | HR 58 | Wt 213.5 lb

## 2017-04-07 DIAGNOSIS — F1410 Cocaine abuse, uncomplicated: Secondary | ICD-10-CM | POA: Insufficient documentation

## 2017-04-07 DIAGNOSIS — I472 Ventricular tachycardia: Secondary | ICD-10-CM | POA: Insufficient documentation

## 2017-04-07 DIAGNOSIS — I130 Hypertensive heart and chronic kidney disease with heart failure and stage 1 through stage 4 chronic kidney disease, or unspecified chronic kidney disease: Secondary | ICD-10-CM | POA: Insufficient documentation

## 2017-04-07 DIAGNOSIS — I340 Nonrheumatic mitral (valve) insufficiency: Secondary | ICD-10-CM | POA: Insufficient documentation

## 2017-04-07 DIAGNOSIS — I2510 Atherosclerotic heart disease of native coronary artery without angina pectoris: Secondary | ICD-10-CM | POA: Insufficient documentation

## 2017-04-07 DIAGNOSIS — Z7982 Long term (current) use of aspirin: Secondary | ICD-10-CM | POA: Diagnosis not present

## 2017-04-07 DIAGNOSIS — F1910 Other psychoactive substance abuse, uncomplicated: Secondary | ICD-10-CM

## 2017-04-07 DIAGNOSIS — F17210 Nicotine dependence, cigarettes, uncomplicated: Secondary | ICD-10-CM | POA: Insufficient documentation

## 2017-04-07 DIAGNOSIS — E1122 Type 2 diabetes mellitus with diabetic chronic kidney disease: Secondary | ICD-10-CM | POA: Diagnosis not present

## 2017-04-07 DIAGNOSIS — I10 Essential (primary) hypertension: Secondary | ICD-10-CM

## 2017-04-07 DIAGNOSIS — I429 Cardiomyopathy, unspecified: Secondary | ICD-10-CM | POA: Diagnosis not present

## 2017-04-07 DIAGNOSIS — N183 Chronic kidney disease, stage 3 (moderate): Secondary | ICD-10-CM | POA: Insufficient documentation

## 2017-04-07 DIAGNOSIS — I5022 Chronic systolic (congestive) heart failure: Secondary | ICD-10-CM

## 2017-04-07 DIAGNOSIS — F1290 Cannabis use, unspecified, uncomplicated: Secondary | ICD-10-CM | POA: Insufficient documentation

## 2017-04-07 DIAGNOSIS — I4729 Other ventricular tachycardia: Secondary | ICD-10-CM

## 2017-04-07 LAB — COMPREHENSIVE METABOLIC PANEL
ALT: 26 U/L (ref 17–63)
AST: 32 U/L (ref 15–41)
Albumin: 4.1 g/dL (ref 3.5–5.0)
Alkaline Phosphatase: 92 U/L (ref 38–126)
Anion gap: 7 (ref 5–15)
BUN: 27 mg/dL — ABNORMAL HIGH (ref 6–20)
CO2: 28 mmol/L (ref 22–32)
Calcium: 9.2 mg/dL (ref 8.9–10.3)
Chloride: 105 mmol/L (ref 101–111)
Creatinine, Ser: 1.65 mg/dL — ABNORMAL HIGH (ref 0.61–1.24)
GFR calc Af Amer: 50 mL/min — ABNORMAL LOW (ref >60)
GFR calc non Af Amer: 43 mL/min — ABNORMAL LOW (ref >60)
Glucose, Bld: 98 mg/dL (ref 65–99)
Potassium: 4.8 mmol/L (ref 3.5–5.1)
Sodium: 140 mmol/L (ref 135–145)
Total Bilirubin: 0.6 mg/dL (ref 0.3–1.2)
Total Protein: 7.8 g/dL (ref 6.5–8.1)

## 2017-04-07 MED ORDER — ISOSORB DINITRATE-HYDRALAZINE 20-37.5 MG PO TABS: 2.0000 | ORAL_TABLET | Freq: Three times a day (TID) | ORAL | 6 refills | Status: DC

## 2017-04-07 NOTE — Progress Notes (Signed)
  Echocardiogram 2D Echocardiogram has been performed.  Edward Mullen L Androw 04/07/2017, 10:32 AM

## 2017-04-07 NOTE — Patient Instructions (Addendum)
STOP digoxin   STOP Life Vest  Increase  Isorbide - Hydralazine (BIDIL) to 2 tabs, 3 times a day.   Labs drawn today  Your physician recommends that you schedule a follow-up appointment in: 3 months

## 2017-04-07 NOTE — Progress Notes (Signed)
Advanced Heart Failure Clinic Note   Primary Care: Dr. Marlowe Sax Primary Cardiologist: Dr. Aundra Dubin   HPI: Edward Mullen is a 61 y.o. male with history of HTN, DM, chronic systolic CHF, and polysubstance abuse.   Admitted 08/23/16 with acute systolic CHF in setting of substance abuse. Echo was done, showing EF 15-20%.  Diuresed with IV lasix and meds adjusted as tolerated.  UDS + for cocaine on admission. Cardiac cath showed coronary disease but not significant enough to explain cardiomyopathy. Down 23 lbs from admission weight with diuresis.  Discharge weight 193 lbs.  He was positive for cocaine again in 2/18.    He was admitted with syncope in 5/18 after using cocaine.  He was noted to have NSVT on telemetry.  Syncope suspected due to VT, no ICD given active substance abuse.  He is now wearing a Lifevest and amiodarone was started.   He presents today for followup.  Taking all his meds, followed by paramedicine.  He has stayed off cocaine since last appointment.  He has cut cigarettes down to 1-2 daily.  He is still smoking marijuana.  Main problem is pain shooting down the back of his legs, suspect sciatica.  Peripheral arterial dopplers in 7/18 did not suggest significant PAD and stopping atorvastatin did not help (now back on Crestor).  I reviewed his echo today, EF up to 50%.  He is doing well besides the leg pain.  No exertional dyspnea (though not very active due to leg pain).  No chest pain. No orthopnea/PND.    Lasix was decreased in 6/18 with elevated creatinine.   Labs (1/18): Digoxin 0.4, BNP 961 Labs (2/18): K 4, creatinine 1.38 Labs (3/18): K 4.4, creatinine 1.85, digoxin 0.4 Labs (5/18): hgb 12.6, K 4.3, creatinine 1.3, digoxin 0.5 Labs (6/18): TSH normal, K 4.4, creatinine 2.04, AST 42, ALT 37  ROS: All other systems reviewed and negative except for as mentioning in HPI, Problem List, or Assessment and Plan.   Social History: Lives in [place] with daughter. Prior  heavy ETOH, has quit.  Smokes 1-2 cigs/day. Has quit using cocaine.  Active marijuana.   Family History  Problem Relation Age of Onset  . Stroke Mother 48  . Heart disease Father 69   Past Medical History: 1. HTN: Not on any meds at home.  2. Type II diabetes 3. Cocaine abuse 4. Active smoking, suspect COPD.  5. Cardiomyopathy: Nonischemic.  - Echo (1/18) with EF 15-20%, moderate LVH, moderate AI, moderate to severe MR, normal RV size with mildly decreased systolic function, PASP 44 mmHg.  - LHC/RHC (1/18): 80% stenosis in PLV branch.  Mean RA 5, PA 47/20 mean 32, mean PCWP 22, CI 2.01.  - Echo (8/18): EF 50% with moderate LVH, diffuse hypokinesis, normal RV size and systolic function, mild AI, trivial MR.  6. CAD: LHC (1/18) with 80% stenosis in a branch of the PLV (not enough CAD to explain cardiomyopathy).  7. Mitral regurgitation: Moderate to severe on 1/18 echo, probably functional.  Trivial MR on 8/18 echo.  8. CKD: Stage 3.  9. Syncope: 5/18 in setting of cocaine use, concern for VT.  10. Lower extremity arterial dopplers (7/18): No significant PAD.    Current Outpatient Prescriptions  Medication Sig Dispense Refill  . amiodarone (PACERONE) 200 MG tablet Take 1 tablet (200 mg total) by mouth daily. 60 tablet 11  . aspirin 81 MG chewable tablet Chew 1 tablet (81 mg total) by mouth daily. 90 tablet 3  .  furosemide (LASIX) 40 MG tablet Take 0.5 tablets (20 mg total) by mouth daily. 30 tablet 6  . gabapentin (NEURONTIN) 300 MG capsule Take 1 capsule (300 mg total) by mouth 2 (two) times daily. 180 capsule 0  . isosorbide-hydrALAZINE (BIDIL) 20-37.5 MG tablet Take 2 tablets by mouth 3 (three) times daily. 180 tablet 6  . rosuvastatin (CRESTOR) 5 MG tablet Take 1 tablet (5 mg total) by mouth daily. 30 tablet 3  . sacubitril-valsartan (ENTRESTO) 49-51 MG Take 1 tablet by mouth 2 (two) times daily. 60 tablet 11  . spironolactone (ALDACTONE) 25 MG tablet Take 1 tablet (25 mg total) by  mouth daily. 90 tablet 3   No current facility-administered medications for this encounter.     No Known Allergies    Vitals:   04/07/17 1105  BP: (!) 149/84  Pulse: (!) 58  SpO2: 100%  Weight: 213 lb 8 oz (96.8 kg)   Wt Readings from Last 3 Encounters:  04/07/17 213 lb 8 oz (96.8 kg)  03/19/17 201 lb (91.2 kg)  03/18/17 207 lb 4.8 oz (94 kg)     PHYSICAL EXAM: General: NAD Neck: No JVD, no thyromegaly or thyroid nodule.  Lungs: Clear to auscultation bilaterally with normal respiratory effort. CV: Nondisplaced PMI.  Heart regular S1/S2, no S3/S4, no murmur.  No peripheral edema.  No carotid bruit.  Normal pedal pulses.  Abdomen: Soft, nontender, no hepatosplenomegaly, no distention.  Skin: Intact without lesions or rashes.  Neurologic: Alert and oriented x 3.  Psych: Normal affect. Extremities: No clubbing or cyanosis.  HEENT: Normal.   ASSESSMENT & PLAN:  1. Chronic systolic CHF: EF 60-45% with moderate to severe MR on echo 1/18. Nonischemic cardiomyopathy. HTN vs cocaine use vs viral vs ETOH. HIV negative, SPEP with no M-spike. Patient does have CAD but not enough to explain cardiomyopathy.  Echo was done today and reviewed, EF up to 50%.  NYHA class II symptoms and not volume overloaded on exam.  BP is mildly elevated today.   - On lower dose of Lasix with rise in creatinine, continue Lasix 20 mg daily.  BMET today.  - Continue spironolactone 25 daily.   - Not on Coreg currently given h/o cocaine abuse.  - With rise in EF, stop digoxin.  - Continue Entresto 49/51 bid.  - Increase Bidil to 2 tabs tid with hypertension.  - Continue to avoid cocaine and ETOH => abstinence may have helped LV function.   - EF too high for ICD at this point.  He can remove Lifevest.  2. HTN: Mild elevation, increase Bidil as above.     3. Polysubstance abuse:  As above, extensive counseling about cocaine abstinence.    4. NSVT: Short runs in hospital.  Concern that VT caused 5/18 syncope  (no recurrence).  - Continue amiodarone 200 mg daily.  Check LFTs, TSH today.  He will need regular eye exam while on amiodarone.  5. Smoking: I encouraged him to quit completely.  6. CKD: Stage 3.  BMET today.  7. Leg pain: Peripheral arterial dopplers did not suggest PAD, and stopping atorvastatin did not help.  Sounds most like sciatica. He is going to talk to his PCP about MRI of back.  8. CAD: 80% stenosis PLV on cath.  No chest pain.  Continue ASA 81 daily and Crestor 5 mg daily.    Followup in 3 months   Loralie Champagne, MD 04/07/17

## 2017-04-08 ENCOUNTER — Telehealth (HOSPITAL_COMMUNITY): Payer: Self-pay

## 2017-04-08 NOTE — Telephone Encounter (Signed)
Per Aundra Dubin MD orders from clinical visit on 04/07/17 to d/c Life vest. Called [phone] and spoke with Elberta Fortis to discontinue patients life vest in reference to EF increasingly better. Elberta Fortis will reach out to patient to pick up equipment patient is aware.

## 2017-04-15 ENCOUNTER — Other Ambulatory Visit (HOSPITAL_COMMUNITY): Payer: Self-pay

## 2017-04-15 ENCOUNTER — Encounter: Payer: Medicaid Other | Admitting: Internal Medicine

## 2017-04-15 NOTE — Progress Notes (Signed)
Paramedicine Encounter    Patient ID: Edward Mullen, male    DOB: 06-13-1956, 61 y.o.   MRN: [id number]    Patient Care Team: Shela Leff, MD as PCP - General  Patient Active Problem List   Diagnosis Date Noted  . Anemia 12/19/2016  . CKD (chronic kidney disease) stage 3, GFR 30-59 ml/min 12/13/2016  . Chronic systolic heart failure ([place]) 09/06/2016  . NSVT (nonsustained ventricular tachycardia) ([place]) 09/06/2016  . Non-ischemic cardiomyopathy ([place]) 08/27/2016  . Non-obstructive hypertrophic cardiomyopathy ([place]) 08/27/2016  . Mitral regurgitation 08/27/2016  . Polysubstance abuse   . Blurred vision, bilateral 09/20/2014  . Tobacco abuse 09/19/2014  . HTN (hypertension) 03/28/2011  . Bilateral lower extremity pain 03/28/2011  . Erectile dysfunction 03/28/2011    Current Outpatient Prescriptions:  .  amiodarone (PACERONE) 200 MG tablet, Take 1 tablet (200 mg total) by mouth daily., Disp: 60 tablet, Rfl: 11 .  aspirin 81 MG chewable tablet, Chew 1 tablet (81 mg total) by mouth daily., Disp: 90 tablet, Rfl: 3 .  gabapentin (NEURONTIN) 300 MG capsule, Take 1 capsule (300 mg total) by mouth 2 (two) times daily., Disp: 180 capsule, Rfl: 0 .  isosorbide-hydrALAZINE (BIDIL) 20-37.5 MG tablet, Take 2 tablets by mouth 3 (three) times daily., Disp: 180 tablet, Rfl: 6 .  rosuvastatin (CRESTOR) 5 MG tablet, Take 1 tablet (5 mg total) by mouth daily., Disp: 30 tablet, Rfl: 3 .  sacubitril-valsartan (ENTRESTO) 49-51 MG, Take 1 tablet by mouth 2 (two) times daily., Disp: 60 tablet, Rfl: 11 .  furosemide (LASIX) 40 MG tablet, Take 0.5 tablets (20 mg total) by mouth daily., Disp: 30 tablet, Rfl: 6 .  spironolactone (ALDACTONE) 25 MG tablet, Take 1 tablet (25 mg total) by mouth daily., Disp: 90 tablet, Rfl: 3 No Known Allergies   Social History   Social History  . Marital status: Married    Spouse name: N/A  . Number of children: N/A  . 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 Smoking status: Current Every Day Smoker    Packs/day: 0.30    Years: 48.00    Types: Cigarettes  . Smokeless tobacco: Never Used     Comment: cutting back 2 per day  . Alcohol use 3.6 oz/week    6 Cans of beer per week  . Drug use: Yes    Types: Marijuana, Cocaine  . Sexual activity: Yes    Birth control/ protection: None   Other Topics Concern  . Not on file   Social History Narrative  . No narrative on file    Physical Exam  Pulmonary/Chest: No respiratory distress. He has no wheezes. He has no rales.  Abdominal: He exhibits no distension. There is no tenderness. There is no guarding.  Musculoskeletal: He exhibits no edema.  Skin: Skin is warm and dry. He is not diaphoretic.        Future Appointments Date Time Provider [place]  04/15/2017 2:45 PM Shela Leff, MD IMP-IMCR [facility]  04/17/2017 4:00 PM MC-MR 1 MC-MRI [facility]  06/30/2017 10:20 AM Larey Dresser, MD MC-HVSC None    ATF pt CAO x4 sitting in the livingroom with no compaints.  He has been running errands this morning.  Pt is missing several meds which was called in for refill.  He stated that he will be  able to pick the up today.  I will f/u with pt later today to see if he were able to get them and I will revise his pill  Box tomorrow.  Pt stated that he dropped off the heart monitor this am.  He hasn't any dizziness or syncope.  Pt denies sob, dizziness, and chest pain.  rx bottles verified and pill box refilled.  BP 118/70 (BP Location: Left Arm, Patient Position: Sitting, Cuff Size: Normal)   Pulse 68   Resp 16   Wt 203 lb (92.1 kg)   SpO2 98%   BMI 27.53 kg/m  **rx called in:  gabapentin filled until 2nd box sun  bidil 1st box sat afternoon Rosuvastatin 1st box thurs Arlyce Harman no pills furesidmide no pills  Weight yesterday-203 Last visit weight-201    Lastacia Solum, EMT Paramedic 04/15/2017    ACTION: Home visit completed

## 2017-04-17 ENCOUNTER — Ambulatory Visit (HOSPITAL_COMMUNITY): Admission: RE | Admit: 2017-04-17 | Payer: Medicaid Other | Source: Ambulatory Visit

## 2017-04-18 ENCOUNTER — Other Ambulatory Visit (HOSPITAL_COMMUNITY): Payer: Self-pay

## 2017-04-18 NOTE — Progress Notes (Signed)
Paramedicine Encounter    Patient ID: Edward Mullen, male    DOB: [birth year], 61 y.o.   MRN: [id number]    Patient Care Team: Shela Leff, MD as PCP - General  Patient Active Problem List   Diagnosis Date Noted  . Anemia 12/19/2016  . CKD (chronic kidney disease) stage 3, GFR 30-59 ml/min 12/13/2016  . Chronic systolic heart failure ([place]) 09/06/2016  . NSVT (nonsustained ventricular tachycardia) ([place]) 09/06/2016  . Non-ischemic cardiomyopathy ([place]) 08/27/2016  . Non-obstructive hypertrophic cardiomyopathy ([place]) 08/27/2016  . Mitral regurgitation 08/27/2016  . Polysubstance abuse   . Blurred vision, bilateral 09/20/2014  . Tobacco abuse 09/19/2014  . HTN (hypertension) 03/28/2011  . Bilateral lower extremity pain 03/28/2011  . Erectile dysfunction 03/28/2011    Current Outpatient Prescriptions:  .  amiodarone (PACERONE) 200 MG tablet, Take 1 tablet (200 mg total) by mouth daily., Disp: 60 tablet, Rfl: 11 .  aspirin 81 MG chewable tablet, Chew 1 tablet (81 mg total) by mouth daily., Disp: 90 tablet, Rfl: 3 .  furosemide (LASIX) 40 MG tablet, Take 0.5 tablets (20 mg total) by mouth daily., Disp: 30 tablet, Rfl: 6 .  isosorbide-hydrALAZINE (BIDIL) 20-37.5 MG tablet, Take 2 tablets by mouth 3 (three) times daily., Disp: 180 tablet, Rfl: 6 .  rosuvastatin (CRESTOR) 5 MG tablet, Take 1 tablet (5 mg total) by mouth daily., Disp: 30 tablet, Rfl: 3 .  sacubitril-valsartan (ENTRESTO) 49-51 MG, Take 1 tablet by mouth 2 (two) times daily., Disp: 60 tablet, Rfl: 11 .  spironolactone (ALDACTONE) 25 MG tablet, Take 1 tablet (25 mg total) by mouth daily., Disp: 90 tablet, Rfl: 3 .  gabapentin (NEURONTIN) 300 MG capsule, Take 1 capsule (300 mg total) by mouth 2 (two) times daily., Disp: 180 capsule, Rfl: 0 No Known Allergies   Social History   Social History  . Marital status: Married    Spouse name: N/A  . Number of children: N/A  . 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 Smoking status: Current Every Day Smoker    Packs/day: 0.30    Years: 48.00    Types: Cigarettes  . Smokeless tobacco: Never Used     Comment: cutting back 2 per day  . Alcohol use 3.6 oz/week    6 Cans of beer per week  . Drug use: Yes    Types: Marijuana, Cocaine  . Sexual activity: Yes    Birth control/ protection: None   Other Topics Concern  . Not on file   Social History Narrative  . No narrative on file    Physical Exam      Future Appointments Date Time Provider [place]  06/24/2017 2:45 PM Shela Leff, MD IMP-IM[facility]  06/30/2017 10:20 AM Larey Dresser, MD MC-HVSC None    ATF pt CAO x4 sitting in the livingroom.  Pt called today and advised me that he had picked up the prescriptions that he was out of earlier this week.  I revised his pill box and will see him in the next two weeks  BP 124/88   Pulse 70   Resp 16   Wt 208 lb (94.3 kg)   SpO2 98%   BMI 28.21 kg/m     Monay Houlton, EMT Paramedic 04/18/2017    ACTION: Home visit completed

## 2017-04-22 ENCOUNTER — Telehealth: Payer: Self-pay

## 2017-04-22 NOTE — Telephone Encounter (Signed)
Pt states since he started his new medicine he has numbness and tingling in his feet, appt [facility] 9/12 at 0915

## 2017-04-22 NOTE — Telephone Encounter (Signed)
Questions about med. Please call pt back.  

## 2017-04-22 NOTE — Telephone Encounter (Signed)
Thanks Helen  

## 2017-04-23 ENCOUNTER — Ambulatory Visit: Payer: Medicaid Other

## 2017-04-29 ENCOUNTER — Encounter: Payer: Self-pay | Admitting: Internal Medicine

## 2017-04-29 ENCOUNTER — Ambulatory Visit (INDEPENDENT_AMBULATORY_CARE_PROVIDER_SITE_OTHER): Payer: Medicaid Other | Admitting: Internal Medicine

## 2017-04-29 VITALS — BP 112/72 | HR 70 | Temp 97.8°F | Ht 72.0 in | Wt 210.5 lb

## 2017-04-29 DIAGNOSIS — R531 Weakness: Secondary | ICD-10-CM

## 2017-04-29 DIAGNOSIS — Z23 Encounter for immunization: Secondary | ICD-10-CM | POA: Insufficient documentation

## 2017-04-29 DIAGNOSIS — R200 Anesthesia of skin: Secondary | ICD-10-CM

## 2017-04-29 DIAGNOSIS — D649 Anemia, unspecified: Secondary | ICD-10-CM | POA: Diagnosis not present

## 2017-04-29 DIAGNOSIS — R2689 Other abnormalities of gait and mobility: Secondary | ICD-10-CM | POA: Diagnosis not present

## 2017-04-29 DIAGNOSIS — M545 Low back pain: Secondary | ICD-10-CM

## 2017-04-29 DIAGNOSIS — M79604 Pain in right leg: Secondary | ICD-10-CM | POA: Diagnosis not present

## 2017-04-29 DIAGNOSIS — K5900 Constipation, unspecified: Secondary | ICD-10-CM | POA: Diagnosis not present

## 2017-04-29 DIAGNOSIS — G8929 Other chronic pain: Secondary | ICD-10-CM

## 2017-04-29 DIAGNOSIS — R292 Abnormal reflex: Secondary | ICD-10-CM

## 2017-04-29 DIAGNOSIS — R208 Other disturbances of skin sensation: Secondary | ICD-10-CM

## 2017-04-29 DIAGNOSIS — R0602 Shortness of breath: Secondary | ICD-10-CM | POA: Diagnosis not present

## 2017-04-29 DIAGNOSIS — M79605 Pain in left leg: Secondary | ICD-10-CM | POA: Diagnosis not present

## 2017-04-29 MED ORDER — CYCLOBENZAPRINE HCL 5 MG PO TABS: 5.0000 mg | ORAL_TABLET | Freq: Three times a day (TID) | ORAL | 1 refills | Status: DC | PRN

## 2017-04-29 NOTE — Assessment & Plan Note (Addendum)
Patient's symptoms have remained the same and may even have worsened after the start of Gabapentin. Based on the patient's history and work up thus far (normal ABIs) it still seems that lumbar radiculopathy is still high on the differential. The patient's balance issues with symmetric numbness are also concerning for vitamin b12 deficiency, which typically presents as symmetric paresthesias or numbness and gait problem. The neuropathy is typically symmetrical and often affects the legs more than the arms. The presentation of b12 deficiency is subacute, and typically does not present with low back pain, which the patient has. The patient had mild anemia Hgb 12.6, MCV 84 on 12/19/2016, less concerning for b12/folate deficiency.   Plan: -Encouraged patient to obtain MRI -Stopped Gabapentin at patients request -Flexeril PRN  -Consider Vitamine b12, folate level next visit

## 2017-04-29 NOTE — Progress Notes (Signed)
   CC: low back pain with bilateral leg pain and numbness  HPI:  [name] is a 61 y.o. with past medical history as documented below presenting for low back pain associated bilateral lower extremity pain and numbness. This is a chronic issue for the patient and he was last seen in clinic on 03/17/2017 for this problem. Patient reports having lower back pain and bilateral lower extremity pain since 2012. States he has acheing pain and an intermittent burning sensation radiating from his lower back to his buttocks and then down the backside of his legs bilaterally. Pain is 8-9 out of 10 in intensity and present every day. He also reports having numbness and tingling in his toes. Denies having any fevers, chills, or joint swelling. He does have occasional weakness and difficulty with balance secondary to the numbness and pain. He was started on Gabapentin 300 mg BID last visit. He states that the gabapentin did not help and he believes it has made the achy pain worse. He also has an MRI ordered last visit, but missed his appointment.   Past Medical History:  Diagnosis Date  . Arthritis    "feels like it in my legs" (09/20/2014)  . Borderline type 2 diabetes mellitus   . Hypertension    Review of Systems:   Review of Systems  Constitutional: Negative for chills, fever and weight loss.  HENT: Negative.   Eyes: Negative.   Respiratory: Positive for shortness of breath. Negative for cough.   Cardiovascular: Negative for chest pain and leg swelling.  Gastrointestinal: Positive for constipation. Negative for diarrhea, nausea and vomiting.  Genitourinary: Negative.   Musculoskeletal: Positive for back pain. Negative for falls, joint pain and myalgias.  Skin: Negative for rash.  Neurological: Positive for tingling, sensory change and weakness. Negative for focal weakness.  Psychiatric/Behavioral: Negative.     Physical Exam:  Vitals:   04/29/17 1045  BP: 112/72  Pulse: 70  Temp: 97.8  F (36.6 C)  TempSrc: Oral  SpO2: 100%  Weight: 210 lb 8 oz (95.5 kg)  Height: 6' (1.829 m)   Physical Exam  Constitutional: He is oriented to person, place, and time. He appears well-developed and well-nourished. No distress.  HENT:  Head: Normocephalic and atraumatic.  Eyes: Conjunctivae and EOM are normal.  Cardiovascular: Normal rate, regular rhythm, normal heart sounds and intact distal pulses.   Pulmonary/Chest: Effort normal and breath sounds normal.  Musculoskeletal:  Strength 5/5 and ROM intact in right and left leg at the hip, knee, and ankle joint. No TTP over the joints or musculature.   Neurological: He is alert and oriented to person, place, and time. He has normal strength.  Reflex Scores:      Patellar reflexes are 2+ on the right side and 3+ on the left side. Skin: Skin is warm and dry.  Psychiatric: He has a normal mood and affect.    Assessment & Plan:   See Encounters Tab for problem based charting.  Patient seen with Dr. Angelia Mould

## 2017-04-29 NOTE — Patient Instructions (Signed)
Edward Mullen,   Please stop taking the Gabapentin because it made you feel worse.   I have prescribed a medicine called Flexeril 5 mg. You can take 1 tablet up to three times daily for back and leg pain. This may not help with the numbness you experience. Take for a few days and the stop the medicine and see how you feel.   I have ordered the MRI again and it should get scheduled.

## 2017-04-29 NOTE — Assessment & Plan Note (Signed)
Received flu vaccination 04/29/2017.

## 2017-04-30 ENCOUNTER — Other Ambulatory Visit (HOSPITAL_COMMUNITY): Payer: Self-pay | Admitting: Pharmacist

## 2017-04-30 ENCOUNTER — Other Ambulatory Visit (HOSPITAL_COMMUNITY): Payer: Self-pay

## 2017-04-30 MED ORDER — ISOSORB DINITRATE-HYDRALAZINE 20-37.5 MG PO TABS: 2.0000 | ORAL_TABLET | Freq: Three times a day (TID) | ORAL | 6 refills | Status: DC

## 2017-04-30 NOTE — Progress Notes (Signed)
Internal Medicine Clinic Attending  I saw and evaluated the patient.  I personally confirmed the key portions of the history and exam documented by Dr. LaCroce and I reviewed pertinent patient test results.  The assessment, diagnosis, and plan were formulated together and I agree with the documentation in the resident's note.  

## 2017-04-30 NOTE — Progress Notes (Signed)
Paramedicine Encounter    Patient ID: Edward Mullen, male    DOB: August 20, 1955, 61 y.o.   MRN: [id number]    Patient Care Team: Shela Leff, MD as PCP - General  Patient Active Problem List   Diagnosis Date Noted  . Need for immunization against influenza 04/29/2017  . Anemia 12/19/2016  . CKD (chronic kidney disease) stage 3, GFR 30-59 ml/min 12/13/2016  . Chronic systolic heart failure ([place]) 09/06/2016  . NSVT (nonsustained ventricular tachycardia) ([place]) 09/06/2016  . Non-ischemic cardiomyopathy ([place]) 08/27/2016  . 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 Mitral regurgitation 08/27/2016  . Polysubstance abuse   . Blurred vision, bilateral 09/20/2014  . Tobacco abuse 09/19/2014  . HTN (hypertension) 03/28/2011  . Bilateral lower extremity pain 03/28/2011  . Erectile dysfunction 03/28/2011    Current Outpatient Prescriptions:  .  amiodarone (PACERONE) 200 MG tablet, Take 1 tablet (200 mg total) by mouth daily., Disp: 60 tablet, Rfl: 11 .  aspirin 81 MG chewable tablet, Chew 1 tablet (81 mg total) by mouth daily., Disp: 90 tablet, Rfl: 3 .  cyclobenzaprine (FLEXERIL) 5 MG tablet, Take 1 tablet (5 mg total) by mouth 3 (three) times daily as needed for muscle spasms (Leg pain)., Disp: 90 tablet, Rfl: 1 .  digoxin (LANOXIN) 0.125 MG tablet, Take 125 mcg by mouth daily., Disp: , Rfl: 3 .  furosemide (LASIX) 40 MG tablet, Take 0.5 tablets (20 mg total) by mouth daily., Disp: 30 tablet, Rfl: 6 .  isosorbide-hydrALAZINE (BIDIL) 20-37.5 MG tablet, Take 2 tablets by mouth 3 (three) times daily., Disp: 180 tablet, Rfl: 6 .  rosuvastatin (CRESTOR) 5 MG tablet, Take 1 tablet (5 mg total) by mouth daily., Disp: 30 tablet, Rfl: 3 .  sacubitril-valsartan (ENTRESTO) 49-51 MG, Take 1 tablet by mouth 2 (two) times daily., Disp: 60 tablet, Rfl: 11 .  spironolactone (ALDACTONE) 25 MG tablet, Take 1 tablet (25 mg total) by mouth daily., Disp: 90 tablet, Rfl: 3 .   gabapentin (NEURONTIN) 300 MG capsule, Take 1 capsule (300 mg total) by mouth 2 (two) times daily., Disp: 180 capsule, Rfl: 0 No Known Allergies   Social History   Social History  . Marital status: Married    Spouse name: N/A  . Number of children: N/A  . 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 Smoking status: Current Every Day Smoker    Packs/day: 0.30    Years: 48.00    Types: Cigarettes  . Smokeless tobacco: Never Used     Comment: cutting back 2 per day  . Alcohol use 3.6 oz/week    6 Cans of beer per week  . Drug use: Yes    Types: Marijuana, Cocaine  . Sexual activity: Yes    Birth control/ protection: None   Other Topics Concern  . Not on file   Social History Narrative  . No narrative on file    Physical Exam  Pulmonary/Chest: No respiratory distress. He has no wheezes. He has no rales.  Abdominal: He exhibits no distension. There is no tenderness. There is no guarding.  Musculoskeletal: He exhibits no edema.  Skin: Skin is warm and dry. He is not diaphoretic.        Future Appointments Date Time Provider [place]  06/24/2017 2:45 PM Shela Leff, MD [facility]  06/30/2017 10:20  AM Larey Dresser, MD [facility] None    ATF pt CAO x4 about to leave to run errands. Pt has no complaints today. He stated that he went to the doctor yesterday and she took him off of gabapentin and told him to take flexril instead.  Pt stated that he believes the flexiril is working because he doesn't have as much tingling in his toes and feet as he did yesterday.  Pt stated that he hasn't had any dizziness or syncope since his last hospital admission.  He tried refilling his own pill box prior to my arrival; only missing one pill. We will continue working on it.  Pt denies sob, headache and chest pain. He also denies missing any meds within  the past two weeks.  rx bottles verified and pill box refilled.   BP 98/66   Pulse 66   Resp 16   Wt 210 lb (95.3 kg)   SpO2 97%   BMI 28.48 kg/m   *rx called in  bidil filled 1st box nothing in 2nd pt will fill entresto filled until 2nd box thur/none fri Call next week (too early today) rosuvas: [id number] Amiodarone [id number]   The pharmacy stated that its too early to refill the bidil due to the way the prescription is written therefore I notified Erika.  Weight yesterday-210 Last visit weight-208    Kaelani Kendrick, EMT Paramedic 04/30/2017    ACTION: Home visit completed Next visit planned for two weeks

## 2017-05-21 ENCOUNTER — Telehealth (HOSPITAL_COMMUNITY): Payer: Self-pay | Admitting: *Deleted

## 2017-05-21 ENCOUNTER — Other Ambulatory Visit (HOSPITAL_COMMUNITY): Payer: Self-pay

## 2017-05-21 NOTE — Telephone Encounter (Signed)
Advanced Heart Failure Triage Encounter  Patient Name: Edward Mullen   Date of Call: 05/21/2017  Problem:  Karena Addison with paramedicine called stating patients weight was up 3lbs overnight ( weight is 215lbs). No shortness of breath, "skinny ankles", no symptoms. Patient admitted to drinking "alot" yesterday. Patient is taking medications as directed.   Plan:  No changes at this time. Limit fluid intake and monitor weight/symptoms and call us if weight continues to increase. Karena Addison will check patients weight on Friday.   Harvie Junior, CMA

## 2017-05-21 NOTE — Progress Notes (Signed)
Paramedicine Encounter    Patient ID: Edward Mullen, male    DOB: [birth year], 61 y.o.   MRN: [id number]    Patient Care Team: Shela Leff, MD as PCP - General  Patient Active Problem List   Diagnosis Date Noted  . Need for immunization against influenza 04/29/2017  . Anemia 12/19/2016  . CKD (chronic kidney disease) stage 3, GFR 30-59 ml/min (HCC) 12/13/2016  . Chronic systolic heart failure ([place]) 09/06/2016  . NSVT (nonsustained ventricular tachycardia) ([place]) 09/06/2016  . Non-ischemic cardiomyopathy ([place]) 08/27/2016  . 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 Mitral regurgitation 08/27/2016  . Polysubstance abuse ([place])   . Blurred vision, bilateral 09/20/2014  . Tobacco abuse 09/19/2014  . HTN (hypertension) 03/28/2011  . Bilateral lower extremity pain 03/28/2011  . Erectile dysfunction 03/28/2011    Current Outpatient Prescriptions:  .  amiodarone (PACERONE) 200 MG tablet, Take 1 tablet (200 mg total) by mouth daily., Disp: 60 tablet, Rfl: 11 .  aspirin 81 MG chewable tablet, Chew 1 tablet (81 mg total) by mouth daily., Disp: 90 tablet, Rfl: 3 .  cyclobenzaprine (FLEXERIL) 5 MG tablet, Take 1 tablet (5 mg total) by mouth 3 (three) times daily as needed for muscle spasms (Leg pain)., Disp: 90 tablet, Rfl: 1 .  digoxin (LANOXIN) 0.125 MG tablet, Take 125 mcg by mouth daily., Disp: , Rfl: 3 .  furosemide (LASIX) 40 MG tablet, Take 0.5 tablets (20 mg total) by mouth daily., Disp: 30 tablet, Rfl: 6 .  isosorbide-hydrALAZINE (BIDIL) 20-37.5 MG tablet, Take 2 tablets by mouth 3 (three) times daily., Disp: 180 tablet, Rfl: 6 .  sacubitril-valsartan (ENTRESTO) 49-51 MG, Take 1 tablet by mouth 2 (two) times daily., Disp: 60 tablet, Rfl: 11 .  spironolactone (ALDACTONE) 25 MG tablet, Take 1 tablet (25 mg total) by mouth daily., Disp: 90 tablet, Rfl: 3 .  gabapentin (NEURONTIN) 300 MG capsule, Take 1 capsule (300 mg total) by mouth 2 (two) times daily.,  Disp: 180 capsule, Rfl: 0 .  rosuvastatin (CRESTOR) 5 MG tablet, Take 1 tablet (5 mg total) by mouth daily., Disp: 30 tablet, Rfl: 3 No Known Allergies   Social History   Social History  . Marital status: Married    Spouse name: N/A  . Number of children: N/A  . 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 Smoking status: Current Every Day Smoker    Packs/day: 0.30    Years: 48.00    Types: Cigarettes  . Smokeless tobacco: Never Used     Comment: cutting back 2 per day  . Alcohol use 3.6 oz/week    6 Cans of beer per week  . Drug use: Yes    Types: Marijuana, Cocaine  . Sexual activity: Yes    Birth control/ protection: None   Other Topics Concern  . Not on file   Social History Narrative  . No narrative on file    Physical Exam  Pulmonary/Chest: No respiratory distress. He has no wheezes.  Abdominal: He exhibits no distension. There is no tenderness. There is no guarding.  Musculoskeletal: He exhibits no edema.  Skin: Skin is warm and dry. He is not diaphoretic.        Future Appointments Date Time Provider [place]  06/24/2017 2:45 PM Shela Leff, MD [facility]  06/30/2017 10:20 AM Aundra Dubin,  Elby Showers, MD [facility] None    ATF pt CAO x4 sitting on the front porch. Pt stated that he feels great except for soreness on his chest where the bra was from the heart monitor.  Pt denies sob, lightheadedness and chest pain.  He stated that he had a beer this past Sunday.  He also has been drinking a lot of water for the past couple of days.  Pt has taken all of his meds without missing any.  Pt's weight is up 3 lbs since yesterday.  I notified the heart failure clinic of the same.  The providers advised pt to monitor his fluid intake real closely and not increase any medications.  I will f/u with pt on Friday to watch weight gain and notify  the heart clinic with the results.     BP 108/70   Pulse 64   Resp 16   Wt 215 lb (97.5 kg)   SpO2 98%   BMI 29.16 kg/m   **rx called in: Lasix filled until tues (2nd box) start wed Weight yesterday-212 Last visit weight-210 (9/19)    Jshaun Abernathy, EMT Paramedic 05/21/2017    ACTION: Home visit completed

## 2017-06-16 ENCOUNTER — Other Ambulatory Visit (HOSPITAL_COMMUNITY): Payer: Self-pay | Admitting: Internal Medicine

## 2017-06-19 ENCOUNTER — Other Ambulatory Visit (HOSPITAL_COMMUNITY): Payer: Self-pay

## 2017-06-19 ENCOUNTER — Encounter (HOSPITAL_COMMUNITY): Payer: Self-pay

## 2017-06-19 NOTE — Progress Notes (Signed)
Paramedicine Encounter    Patient ID: Edward Mullen, male    DOB: 1955-10-14, 61 y.o.   MRN: [id number]    Patient Care Team: Shela Leff, MD as PCP - General  Patient Active Problem List   Diagnosis Date Noted  . Need for immunization against influenza 04/29/2017  . Anemia 12/19/2016  . CKD (chronic kidney disease) stage 3, GFR 30-59 ml/min (HCC) 12/13/2016  . Chronic systolic heart failure ([place]) 09/06/2016  . NSVT (nonsustained ventricular tachycardia) ([place]) 09/06/2016  . Non-ischemic cardiomyopathy ([place]) 08/27/2016  . 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 Mitral regurgitation 08/27/2016  . Polysubstance abuse ([place])   . Blurred vision, bilateral 09/20/2014  . Tobacco abuse 09/19/2014  . HTN (hypertension) 03/28/2011  . Bilateral lower extremity pain 03/28/2011  . Erectile dysfunction 03/28/2011    Current Outpatient Medications:  .  amiodarone (PACERONE) 200 MG tablet, Take 1 tablet (200 mg total) by mouth daily., Disp: 60 tablet, Rfl: 11 .  aspirin 81 MG chewable tablet, Chew 1 tablet (81 mg total) by mouth daily., Disp: 90 tablet, Rfl: 3 .  digoxin (LANOXIN) 0.125 MG tablet, Take 125 mcg by mouth daily., Disp: , Rfl: 3 .  furosemide (LASIX) 40 MG tablet, Take 0.5 tablets (20 mg total) by mouth daily., Disp: 30 tablet, Rfl: 6 .  isosorbide-hydrALAZINE (BIDIL) 20-37.5 MG tablet, Take 2 tablets by mouth 3 (three) times daily., Disp: 180 tablet, Rfl: 6 .  rosuvastatin (CRESTOR) 5 MG tablet, TAKE 1 TABLET BY MOUTH EVERY DAY, Disp: 30 tablet, Rfl: 3 .  sacubitril-valsartan (ENTRESTO) 49-51 MG, Take 1 tablet by mouth 2 (two) times daily., Disp: 60 tablet, Rfl: 11 .  spironolactone (ALDACTONE) 25 MG tablet, Take 1 tablet (25 mg total) by mouth daily., Disp: 90 tablet, Rfl: 3 .  cyclobenzaprine (FLEXERIL) 5 MG tablet, Take 1 tablet (5 mg total) by mouth 3 (three) times daily as needed for muscle spasms (Leg pain)., Disp: 90 tablet, Rfl: 1 .   gabapentin (NEURONTIN) 300 MG capsule, Take 1 capsule (300 mg total) by mouth 2 (two) times daily., Disp: 180 capsule, Rfl: 0 No Known Allergies   Social History   Socioeconomic History  . Marital status: Married    Spouse name: Not on file  . Number of children: Not on file  . Years of education: Not on file  . Highest education level: Not on file  Social Needs  . Financial resource strain: Not on file  . Food insecurity - worry: Not on file  . Food insecurity - inability: Not on file  . Transportation needs - medical: Not on file  . Transportation needs - non-medical: Not on file  Occupational History  . Occupation: Engineer, manufacturing systems    Comment: has not been able to work steadily for the last year or two  Tobacco Use  . Smoking status: Current Every Day Smoker    Packs/day: 0.30    Years: 48.00    Pack years: 14.40    Types: Cigarettes  . Smokeless tobacco: Never Used  . Tobacco comment: cutting back 2 per day  Substance and Sexual Activity  . Alcohol use: Yes    Alcohol/week: 3.6 oz    Types: 6 Cans of beer per week  . Drug use: Yes    Types: Marijuana, Cocaine  . Sexual activity: Yes    Birth control/protection: None  Other Topics Concern  . Not on file  Social History Narrative  . Not on file    Physical Exam  Pulmonary/Chest: No respiratory distress. He has no wheezes. He has no rales.  Abdominal: He exhibits no distension. There is no tenderness. There is no guarding.  Musculoskeletal: He exhibits no edema.  Skin: Skin is warm and dry. He is not diaphoretic.        Future Appointments  Date Time Provider [place]  06/24/2017  2:45 PM Shela Leff, MD [facility]  06/30/2017 10:20 AM Larey Dresser, MD [facility] None    ATF pt CAO x4 sitting in the livingroom with no complaints.  Pt stated that he ate a lot of food high in sodium over the weekend due to "homecoming".  Pt ate ribs, chitterlings, mac and cheese and drank 1 beer. We  discussed his food choices and drinking alcohol.  Pt stated that he know that he shouldn't but he only did it this weekend due to the festivities.  Pt denies sob, dizziness and chest pain.  Pt had a 4lb weight increase over night Monday which decrease to normal yesterday.  Pt denies taking additional lasix. Pt requests that his prescriptions come from [place] due to his limited access to transportation.  Pt rx bottles verified and pill box refilled. onlu one box refilled due to him being out of a lot of his meds that are still at the pharmacy.  BP 132/84   Pulse 66   Resp 16   Wt 210 lb (95.3 kg)   SpO2 98%   BMI 28.48 kg/m    Weight yesterday-212 Last visit weight-215    Serenitie Vinton, EMT Paramedic 06/19/2017    ACTION: Home visit completed Next visit planned for next week

## 2017-06-24 ENCOUNTER — Encounter: Payer: Self-pay | Admitting: Internal Medicine

## 2017-06-24 ENCOUNTER — Encounter: Payer: Medicaid Other | Admitting: Internal Medicine

## 2017-06-25 ENCOUNTER — Other Ambulatory Visit: Payer: Self-pay

## 2017-06-25 ENCOUNTER — Ambulatory Visit (INDEPENDENT_AMBULATORY_CARE_PROVIDER_SITE_OTHER): Payer: Medicaid Other | Admitting: Internal Medicine

## 2017-06-25 ENCOUNTER — Encounter (INDEPENDENT_AMBULATORY_CARE_PROVIDER_SITE_OTHER): Payer: Self-pay

## 2017-06-25 VITALS — BP 113/70 | HR 71 | Temp 97.9°F | Ht 72.0 in | Wt 209.8 lb

## 2017-06-25 DIAGNOSIS — M79605 Pain in left leg: Secondary | ICD-10-CM | POA: Diagnosis not present

## 2017-06-25 DIAGNOSIS — R079 Chest pain, unspecified: Secondary | ICD-10-CM

## 2017-06-25 DIAGNOSIS — M79604 Pain in right leg: Secondary | ICD-10-CM | POA: Diagnosis not present

## 2017-06-25 DIAGNOSIS — G8929 Other chronic pain: Secondary | ICD-10-CM | POA: Diagnosis not present

## 2017-06-25 DIAGNOSIS — M545 Low back pain: Secondary | ICD-10-CM | POA: Diagnosis not present

## 2017-06-25 DIAGNOSIS — M5416 Radiculopathy, lumbar region: Secondary | ICD-10-CM | POA: Diagnosis not present

## 2017-06-25 NOTE — Progress Notes (Signed)
   CC: Low back pain with bilateral leg radiation  HPI:  [name] is a 61 y.o. male with PMHx detailed below presenting with Chronic low back pain with radiculopathy.  See problem based assessment and plan below for additional details.  Bilateral lower extremity pain He continues to have severe low back pain with leg radiation. This pain is worsened by standing and walking, he was able to walk from the parking lot to the clinic but says that is about his maximum tolerable distance due to pain. He also has a sense of numbness and pain in his feet that persists even after resting. He has not yet had an MRI as previously recommended. Assessment Lumbar pain with radiculopathy and probably neurogenic claudication This is concerning for significant spinal canal stenosis that may not respond to conservative treatments. I believe he is more agreeable to going through with MRI at this visit than in the past. Plan MRI lumbar spine Check vitamin B12, folate for alternate causes of neuropathy - Normal    Past Medical History:  Diagnosis Date  . Arthritis    "feels like it in my legs" (09/20/2014)  . Borderline type 2 diabetes mellitus   . Hypertension     Review of Systems: Review of Systems  Cardiovascular: Positive for chest pain. Negative for leg swelling.  Genitourinary: Negative for flank pain.  Musculoskeletal: Positive for back pain and joint pain. Negative for falls.  Neurological: Negative for dizziness and weakness.  Psychiatric/Behavioral: Negative for depression.     Physical Exam: Vitals:   06/25/17 1512  BP: 113/70  Pulse: 71  Temp: 97.9 F (36.6 C)  TempSrc: Oral  SpO2: 100%  Weight: 209 lb 12.8 oz (95.2 kg)  Height: 6' (1.829 m)   GENERAL- alert, co-operative, NAD CARDIAC- RRR, no murmurs, rubs or gallops. RESP- CTAB, no wheezes or crackles. BACK- Tenderness over L5-S1 spinous process, paraspinal muscle tenderness extending bilaterally NEURO- Normal  patellar reflex, 5/5 strength throughout, light touch sensation intact in feet EXTREMITIES- Symmetric, no pedal edema. SKIN- Warm, dry, No rash or lesion. PSYCH- Normal mood and affect, appropriate thought content and speech.   Assessment & Plan:   See encounters tab for problem based medical decision making.   Patient discussed with Dr. Angelia Mould

## 2017-06-25 NOTE — Patient Instructions (Signed)
It was a pleasure to see you today [name].  I think the next step in any plan is to get some images of your lower back. This would be useful in deciding the next most appropriate step in treating your symptoms. I am ordering an MRI and you will be contacted to arrange this study. It is very important that you keep this.  We are also checking some blood work to make sure you have no vitamin deficiency worsening your symptoms.  Please call us back after you have the MRI done to schedule your follow up visit.

## 2017-06-26 ENCOUNTER — Encounter (HOSPITAL_COMMUNITY): Payer: Self-pay

## 2017-06-26 ENCOUNTER — Other Ambulatory Visit (HOSPITAL_COMMUNITY): Payer: Self-pay

## 2017-06-26 LAB — FOLATE: Folate: 15.7 ng/mL (ref >3.0)

## 2017-06-26 LAB — VITAMIN B12: Vitamin B-12: 1353 pg/mL — ABNORMAL HIGH (ref 232–1245)

## 2017-06-26 NOTE — Progress Notes (Signed)
Paramedicine Encounter    Patient ID: Edward Mullen, male    DOB: 08-30-1955, 61 y.o.   MRN: [id number]    Patient Care Team: Shela Leff, MD as PCP - General  Patient Active Problem List   Diagnosis Date Noted  . Need for immunization against influenza 04/29/2017  . Anemia 12/19/2016  . CKD (chronic kidney disease) stage 3, GFR 30-59 ml/min (HCC) 12/13/2016  . Chronic systolic heart failure ([place]) 09/06/2016  . NSVT (nonsustained ventricular tachycardia) ([place]) 09/06/2016  . Non-ischemic cardiomyopathy ([place]) 08/27/2016  . Non-obstructive hypertrophic cardiomyopathy ([place]) 08/27/2016  . Mitral regurgitation 08/27/2016  . Polysubstance abuse ([place])   . Blurred vision, bilateral 09/20/2014  . Tobacco abuse 09/19/2014  . HTN (hypertension) 03/28/2011  . Bilateral lower extremity pain 03/28/2011  . Erectile dysfunction 03/28/2011    Current Outpatient Medications:  .  amiodarone (PACERONE) 200 MG tablet, Take 1 tablet (200 mg total) by mouth daily., Disp: 60 tablet, Rfl: 11 .  aspirin 81 MG chewable tablet, Chew 1 tablet (81 mg total) by mouth daily., Disp: 90 tablet, Rfl: 3 .  cyclobenzaprine (FLEXERIL) 5 MG tablet, Take 1 tablet (5 mg total) by mouth 3 (three) times daily as needed for muscle spasms (Leg pain)., Disp: 90 tablet, Rfl: 1 .  digoxin (LANOXIN) 0.125 MG tablet, Take 125 mcg by mouth daily., Disp: , Rfl: 3 .  furosemide (LASIX) 40 MG tablet, Take 0.5 tablets (20 mg total) by mouth daily., Disp: 30 tablet, Rfl: 6 .  gabapentin (NEURONTIN) 300 MG capsule, Take 1 capsule (300 mg total) by mouth 2 (two) times daily., Disp: 180 capsule, Rfl: 0 .  isosorbide-hydrALAZINE (BIDIL) 20-37.5 MG tablet, Take 2 tablets by mouth 3 (three) times daily., Disp: 180 tablet, Rfl: 6 .  rosuvastatin (CRESTOR) 5 MG tablet, TAKE 1 TABLET BY MOUTH EVERY DAY, Disp: 30 tablet, Rfl: 3 .  sacubitril-valsartan (ENTRESTO) 49-51 MG, Take 1 tablet by mouth 2 (two) times daily., Disp: 60 tablet,  Rfl: 11 .  spironolactone (ALDACTONE) 25 MG tablet, Take 1 tablet (25 mg total) by mouth daily., Disp: 90 tablet, Rfl: 3 No Known Allergies   Social History   Socioeconomic History  . Marital status: Married    Spouse name: Not on file  . Number of children: Not on file  . Years of education: Not on file  . Highest education level: Not on file  Social Needs  . Financial resource strain: Not on file  . Food insecurity - worry: Not on file  . Food insecurity - inability: Not on file  . Transportation needs - medical: Not on file  . Transportation needs - non-medical: Not on file  Occupational History  . Occupation: Engineer, manufacturing systems    Comment: has not been able to work steadily for the last year or two  Tobacco Use  . Smoking status: Current Every Day Smoker    Packs/day: 0.30    Years: 48.00    Pack years: 14.40    Types: Cigarettes  . Smokeless tobacco: Never Used  . Tobacco comment: cutting back 1 per day  Substance and Sexual Activity  . Alcohol use: Yes    Alcohol/week: 3.6 oz    Types: 6 Cans of beer per week  . Drug use: Yes    Types: Marijuana, Cocaine  . Sexual activity: Yes    Birth control/protection: None  Other Topics Concern  . Not on file  Social History Narrative  . Not on file    Physical Exam  Pulmonary/Chest: No respiratory distress.  Abdominal: He exhibits no distension.  Musculoskeletal: He exhibits no edema.  Skin: Skin is warm and dry. He is not diaphoretic.        Future Appointments  Date Time Provider [place]  06/30/2017 10:20 AM Larey Dresser, MD [facility] None    ATF pt CAO x4 sitting in living room. CHP visit to check pt's pill pack from [facility].  Pt is new to the pill pack program and system therefore I wanted to check to assure his packs were filled according to "epic med list".  Pt denies sob, dizziness and chest pain. Pt still has no issues with syncope since the last incident.  Pill pack verified and next  visit noted.  BP 128/78   Pulse 78   Resp 16   SpO2 99%      Aydden Cumpian, EMT Paramedic 06/26/2017    ACTION: Home visit completed Next visit planned for dec 6

## 2017-06-27 NOTE — Assessment & Plan Note (Signed)
He continues to have severe low back pain with leg radiation. This pain is worsened by standing and walking, he was able to walk from the parking lot to the clinic but says that is about his maximum tolerable distance due to pain. He also has a sense of numbness and pain in his feet that persists even after resting. He has not yet had an MRI as previously recommended. Assessment Lumbar pain with radiculopathy and probably neurogenic claudication This is concerning for significant spinal canal stenosis that may not respond to conservative treatments. I believe he is more agreeable to going through with MRI at this visit than in the past. Plan MRI lumbar spine Check vitamin B12, folate for alternate causes of neuropathy - Normal

## 2017-06-27 NOTE — Progress Notes (Signed)
Internal Medicine Clinic Attending  Case discussed with Dr. Rice at the time of the visit.  We reviewed the resident's history and exam and pertinent patient test results.  I agree with the assessment, diagnosis, and plan of care documented in the resident's note.  

## 2017-06-30 ENCOUNTER — Ambulatory Visit (HOSPITAL_COMMUNITY)
Admission: RE | Admit: 2017-06-30 | Discharge: 2017-06-30 | Disposition: A | Discharge status: Home / Self Care | Payer: Medicaid Other | Source: Ambulatory Visit | Attending: Cardiology | Admitting: Cardiology

## 2017-06-30 ENCOUNTER — Other Ambulatory Visit (HOSPITAL_COMMUNITY): Payer: Self-pay

## 2017-06-30 ENCOUNTER — Other Ambulatory Visit (HOSPITAL_COMMUNITY): Payer: Self-pay | Admitting: *Deleted

## 2017-06-30 ENCOUNTER — Encounter (HOSPITAL_COMMUNITY): Payer: Self-pay | Admitting: Cardiology

## 2017-06-30 VITALS — BP 130/80 | HR 80 | Wt 210.0 lb

## 2017-06-30 DIAGNOSIS — R55 Syncope and collapse: Secondary | ICD-10-CM | POA: Insufficient documentation

## 2017-06-30 DIAGNOSIS — F17210 Nicotine dependence, cigarettes, uncomplicated: Secondary | ICD-10-CM | POA: Diagnosis not present

## 2017-06-30 DIAGNOSIS — F1910 Other psychoactive substance abuse, uncomplicated: Secondary | ICD-10-CM | POA: Diagnosis not present

## 2017-06-30 DIAGNOSIS — Z79899 Other long term (current) drug therapy: Secondary | ICD-10-CM | POA: Diagnosis not present

## 2017-06-30 DIAGNOSIS — E1122 Type 2 diabetes mellitus with diabetic chronic kidney disease: Secondary | ICD-10-CM | POA: Diagnosis not present

## 2017-06-30 DIAGNOSIS — I130 Hypertensive heart and chronic kidney disease with heart failure and stage 1 through stage 4 chronic kidney disease, or unspecified chronic kidney disease: Secondary | ICD-10-CM | POA: Diagnosis present

## 2017-06-30 DIAGNOSIS — F1410 Cocaine abuse, uncomplicated: Secondary | ICD-10-CM | POA: Insufficient documentation

## 2017-06-30 DIAGNOSIS — I472 Ventricular tachycardia: Secondary | ICD-10-CM | POA: Insufficient documentation

## 2017-06-30 DIAGNOSIS — N183 Chronic kidney disease, stage 3 (moderate): Secondary | ICD-10-CM | POA: Diagnosis not present

## 2017-06-30 DIAGNOSIS — I5022 Chronic systolic (congestive) heart failure: Secondary | ICD-10-CM | POA: Insufficient documentation

## 2017-06-30 DIAGNOSIS — Z7982 Long term (current) use of aspirin: Secondary | ICD-10-CM | POA: Diagnosis not present

## 2017-06-30 DIAGNOSIS — I429 Cardiomyopathy, unspecified: Secondary | ICD-10-CM | POA: Diagnosis not present

## 2017-06-30 DIAGNOSIS — I4729 Other ventricular tachycardia: Secondary | ICD-10-CM

## 2017-06-30 DIAGNOSIS — I340 Nonrheumatic mitral (valve) insufficiency: Secondary | ICD-10-CM | POA: Insufficient documentation

## 2017-06-30 DIAGNOSIS — I2510 Atherosclerotic heart disease of native coronary artery without angina pectoris: Secondary | ICD-10-CM | POA: Insufficient documentation

## 2017-06-30 LAB — BASIC METABOLIC PANEL
Anion gap: 8 (ref 5–15)
BUN: 23 mg/dL — ABNORMAL HIGH (ref 6–20)
CO2: 25 mmol/L (ref 22–32)
Calcium: 9 mg/dL (ref 8.9–10.3)
Chloride: 104 mmol/L (ref 101–111)
Creatinine, Ser: 1.77 mg/dL — ABNORMAL HIGH (ref 0.61–1.24)
GFR calc Af Amer: 45 mL/min — ABNORMAL LOW (ref >60)
GFR calc non Af Amer: 39 mL/min — ABNORMAL LOW (ref >60)
Glucose, Bld: 102 mg/dL — ABNORMAL HIGH (ref 65–99)
Potassium: 4.1 mmol/L (ref 3.5–5.1)
Sodium: 137 mmol/L (ref 135–145)

## 2017-06-30 MED ORDER — EPLERENONE 25 MG PO TABS: 25.0000 mg | ORAL_TABLET | Freq: Every day | ORAL | 3 refills | Status: DC

## 2017-06-30 NOTE — Progress Notes (Signed)
Paramedicine CSW Initial Assessment  Edward Mullen is enrolled in the [employer] Paramedicine Program through [facility].  The patient presents today in association with an [facility] Appointment.  Patient seen today by CSW for follow up/assistance on Financial difficulties and Housing.   Social Determinants impacting successful heart failure regimen:  Housing: patient lives with his daughter and grandchildren. Patient currently sleeping on the couch. Food: Do you have enough food? Yes  Do you know and understand healthy eating and how that affects your heart failure diagnosis? Yes  Do you follow a low salt diet?  Yes  Utilities: Do you have gas and/or electricity on in your home? Yes  Income: What is your source of income? Disability Insurance: Medicaid Transportation: Do you have transportation to your medical appointments?Yes  If yes, how? Typically take public transportation. CSW provided number for Medicaid transportation if needed    Daily Health Needs: Do you have a scale and weigh each day?  Yes  Are you able to adhere to your medication regimen? Yes  Do you ever take medications differently than prescribed? No  Do you know the zones of Heart Failure?  Yes  Do you know how to contact the HF Clinic appropriately with worsening symptoms or weight increases?  Yes   Do you have an Advanced Directive?  Did not discuss. If not, would you like to complete? Will address on next visit  Do you have any identified obstacles / challenges for adherence to current treatment plan?Yes  patient reports biggest challenge at the moment is housing. He is sleeping on the couch at his daughters and it's too much with all the children.  Patient appears motivated to seek independent housing and will follow up on list provided by CSW. Patient will return call to CSW if further needs arise or assistance needed for housing.   CSW assisted patient with  [employer] and Supportive Counseling with the goal to independent housing and continued adjustment to healthy living.  [facility] will continue to coordinate and monitor patient's treatment plan. CSW continues to be available for identified needs. Raquel Sarna, [place], [place]

## 2017-06-30 NOTE — Progress Notes (Signed)
Paramedicine Encounter   Patient ID: Edward Mullen , male,   DOB: Dec 30, 1955,61 y.o.,  MRN: [id number]   Met patient in clinic today with provider Dr. Aundra Dubin.  Pt denies sob, dizziness and chest pain.  Pt stated that his breast area feels like its getting bigger.  Dr. Aundra Dubin advised him that it was a side effect of spirolactone therefore he switched him to Inspra.  Pt was advised to stop smoking cigarettes altogether and continue to limit his alcohol intake.  Pt drinks a beer "every now and then".  Pt receive pill packs from [facility], so I will take the new rx list up there and ask them to revise his packs.    rx changes:  Stop spirolactone Stop digoxin Start inspra  Pt Also talked to [place] about his housing situation.    Time spent with patient 35 mins  Edward Mullen, EMT-Paramedic 06/30/2017   ACTION: Home visit completed

## 2017-06-30 NOTE — Patient Instructions (Addendum)
Stop Spironolactone   Stop Amiodarone  Stop digoxin  Start Epelerone 25 mg daily  Your physician recommends that you schedule a follow-up appointment in: 4 months Dr. Aundra Dubin

## 2017-07-01 NOTE — Progress Notes (Signed)
Advanced Heart Failure Clinic Note   Primary Care: Dr. Marlowe Sax Primary Cardiologist: Dr. Aundra Dubin   HPI: Edward Mullen is a 61 y.o. male with history of HTN, DM, chronic systolic CHF, and polysubstance abuse.   Admitted 08/23/16 with acute systolic CHF in setting of substance abuse. Echo was done, showing EF 15-20%.  Diuresed with IV lasix and meds adjusted as tolerated.  UDS + for cocaine on admission. Cardiac cath showed coronary disease but not significant enough to explain cardiomyopathy. Down 23 lbs from admission weight with diuresis.  Discharge weight 193 lbs.  He was positive for cocaine again in 2/18.    He was admitted with syncope in 5/18 after using cocaine.  He was noted to have NSVT on telemetry.  Syncope suspected due to VT, no ICD given active substance abuse but had Lifevest placed. EF 50% on 8/18 echo, so Lifevest subsequently removed.  He presents today for followup of CHF.  He is not using any cocaine.  He drinks about 1 beer/week and smokes 1 cigarette/day.  No significant exertional dyspnea.  Rare atypical chest pain (no trigger).  No palpitations or lightheadedness.  He does report bilateral breast tenderness.    Labs (1/18): Digoxin 0.4, BNP 961 Labs (2/18): K 4, creatinine 1.38 Labs (3/18): K 4.4, creatinine 1.85, digoxin 0.4 Labs (5/18): hgb 12.6, K 4.3, creatinine 1.3, digoxin 0.5 Labs (6/18): TSH normal, K 4.4, creatinine 2.04, AST 42, ALT 37 Labs (8/18): K 4.8, creatinine 1.65  ROS: All systems reviewed and negative except as per HPI.   Social History: Lives in [place] with daughter. Prior heavy ETOH, now 1 beer/week.  Smokes 1-2 cigs/day. Has quit using cocaine.  Active marijuana.   Family History  Problem Relation Age of Onset  . Stroke Mother 54  . Heart disease Father 60   Past Medical History: 1. HTN  2. Type II diabetes 3. Cocaine abuse 4. Active smoking, suspect COPD.  5. Cardiomyopathy: Nonischemic.  - Echo (1/18) with EF 15-20%,  moderate LVH, moderate AI, moderate to severe MR, normal RV size with mildly decreased systolic function, PASP 44 mmHg.  - LHC/RHC (1/18): 80% stenosis in PLV branch.  Mean RA 5, PA 47/20 mean 32, mean PCWP 22, CI 2.01.  - Echo (8/18): EF 50% with moderate LVH, diffuse hypokinesis, normal RV size and systolic function, mild AI, trivial MR.  6. CAD: LHC (1/18) with 80% stenosis in a branch of the PLV (not enough CAD to explain cardiomyopathy).  7. Mitral regurgitation: Moderate to severe on 1/18 echo, probably functional.  Trivial MR on 8/18 echo.  8. CKD: Stage 3.  9. Syncope: 5/18 in setting of cocaine use, concern for VT.  10. Lower extremity arterial dopplers (7/18): No significant PAD.    Current Outpatient Medications  Medication Sig Dispense Refill  . aspirin 81 MG chewable tablet Chew 1 tablet (81 mg total) by mouth daily. 90 tablet 3  . cyclobenzaprine (FLEXERIL) 5 MG tablet Take 1 tablet (5 mg total) by mouth 3 (three) times daily as needed for muscle spasms (Leg pain). 90 tablet 1  . furosemide (LASIX) 40 MG tablet Take 0.5 tablets (20 mg total) by mouth daily. 30 tablet 6  . gabapentin (NEURONTIN) 300 MG capsule Take 1 capsule (300 mg total) by mouth 2 (two) times daily. 180 capsule 0  . isosorbide-hydrALAZINE (BIDIL) 20-37.5 MG tablet Take 2 tablets by mouth 3 (three) times daily. 180 tablet 6  . rosuvastatin (CRESTOR) 5 MG tablet TAKE 1 TABLET  BY MOUTH EVERY DAY 30 tablet 3  . sacubitril-valsartan (ENTRESTO) 49-51 MG Take 1 tablet by mouth 2 (two) times daily. 60 tablet 11  . eplerenone (INSPRA) 25 MG tablet Take 1 tablet (25 mg total) daily by mouth. 30 tablet 3   No current facility-administered medications for this encounter.     No Known Allergies    Vitals:   06/30/17 1000  BP: 130/80  Pulse: 80  SpO2: 98%  Weight: 210 lb (95.3 kg)   Wt Readings from Last 3 Encounters:  06/30/17 210 lb (95.3 kg)  06/25/17 209 lb 12.8 oz (95.2 kg)  06/19/17 210 lb (95.3 kg)       PHYSICAL EXAM: General: NAD Neck: No JVD, no thyromegaly or thyroid nodule.  Lungs: Clear to auscultation bilaterally with normal respiratory effort. CV: Nondisplaced PMI.  Heart regular S1/S2, no S3/S4, no murmur.  No peripheral edema.  No carotid bruit.  Normal pedal pulses.  Abdomen: Soft, nontender, no hepatosplenomegaly, no distention.  Skin: Breast tenderness.  Neurologic: Alert and oriented x 3.  Psych: Normal affect. Extremities: No clubbing or cyanosis.  HEENT: Normal.   ASSESSMENT & PLAN:  1. Chronic systolic CHF: EF 93-26% with moderate to severe MR on echo 1/18. Nonischemic cardiomyopathy. HTN vs cocaine use vs viral vs ETOH. HIV negative, SPEP with no M-spike. Patient does have CAD but not enough to explain cardiomyopathy.  Echo in 8/18 showed EF up to 50%.  Today, NYHA class II symptoms and no volume overload on exam.   - Continue Lasix 20 mg daily.  BMET today.  - Suspect breast tenderness is from spironolactone, will switch to eplerenone 25 mg daily.    - Not on Coreg currently given h/o cocaine abuse (he has now stopped).  Will head off on starting Coreg given improvement in EF to 50%.    - With rise in EF, stop digoxin.  - Continue Entresto 49/51 bid.  - Increase Bidil to 2 tabs tid with hypertension.  - Continue to avoid cocaine and ETOH => abstinence may have helped LV function.   - EF too high for ICD at this point.   2. HTN: BP controlled on current meds.     3. Polysubstance abuse:  I encouraged him to stay off cocaine and heavy ETOH.    4. NSVT: Short runs during last hospitalization.  Concern that VT caused 5/18 syncope (no recurrence). EF has now improved to 50%.  - He can stop amiodarone at this point.  5. Smoking: I encouraged him to quit completely.  6. CKD: Stage 3.  BMET today.  7. CAD: 80% stenosis PLV on cath.  No chest pain.  Continue ASA 81 daily and Crestor 5 mg daily.    Followup in 4 months   Loralie Champagne, MD 07/01/17

## 2017-07-19 ENCOUNTER — Encounter (HOSPITAL_COMMUNITY): Payer: Self-pay

## 2017-07-19 ENCOUNTER — Ambulatory Visit (HOSPITAL_COMMUNITY)
Admission: RE | Admit: 2017-07-19 | Discharge: 2017-07-19 | Disposition: A | Discharge status: Home / Self Care | Payer: Medicaid Other | Source: Ambulatory Visit | Attending: Internal Medicine | Admitting: Internal Medicine

## 2017-07-19 ENCOUNTER — Other Ambulatory Visit: Payer: Self-pay | Admitting: Internal Medicine

## 2017-07-19 DIAGNOSIS — M79604 Pain in right leg: Secondary | ICD-10-CM | POA: Diagnosis present

## 2017-07-19 DIAGNOSIS — M79605 Pain in left leg: Principal | ICD-10-CM

## 2017-07-19 DIAGNOSIS — M5127 Other intervertebral disc displacement, lumbosacral region: Secondary | ICD-10-CM | POA: Diagnosis not present

## 2017-07-19 DIAGNOSIS — M48061 Spinal stenosis, lumbar region without neurogenic claudication: Secondary | ICD-10-CM | POA: Insufficient documentation

## 2017-07-23 ENCOUNTER — Other Ambulatory Visit: Payer: Self-pay | Admitting: Internal Medicine

## 2017-07-23 ENCOUNTER — Other Ambulatory Visit (HOSPITAL_COMMUNITY): Payer: Self-pay

## 2017-07-23 ENCOUNTER — Encounter (HOSPITAL_COMMUNITY): Payer: Self-pay

## 2017-07-23 DIAGNOSIS — M79604 Pain in right leg: Secondary | ICD-10-CM

## 2017-07-23 DIAGNOSIS — M79605 Pain in left leg: Principal | ICD-10-CM

## 2017-07-23 MED ORDER — NICOTINE 21 MG/24HR TD PT24: 21.0000 mg | MEDICATED_PATCH | Freq: Every day | TRANSDERMAL | 0 refills | Status: DC

## 2017-07-23 NOTE — Progress Notes (Signed)
Paramedicine Encounter    Patient ID: Edward Mullen, male    DOB: 02/12/1956, 61 y.o.   MRN: [id number]    Patient Care Team: Shela Leff, MD as PCP - General  Patient Active Problem List   Diagnosis Date Noted  . Need for immunization against influenza 04/29/2017  . Anemia 12/19/2016  . CKD (chronic kidney disease) stage 3, GFR 30-59 ml/min (HCC) 12/13/2016  . Chronic systolic heart failure ([place]) 09/06/2016  . NSVT (nonsustained ventricular tachycardia) ([place]) 09/06/2016  . Non-ischemic cardiomyopathy ([place]) 08/27/2016  . Non-obstructive hypertrophic cardiomyopathy ([place]) 08/27/2016  . Mitral regurgitation 08/27/2016  . Polysubstance abuse ([place])   . Blurred vision, bilateral 09/20/2014  . Tobacco abuse 09/19/2014  . HTN (hypertension) 03/28/2011  . Bilateral lower extremity pain 03/28/2011  . Erectile dysfunction 03/28/2011    Current Outpatient Medications:  .  aspirin 81 MG chewable tablet, Chew 1 tablet (81 mg total) by mouth daily., Disp: 90 tablet, Rfl: 3 .  cyclobenzaprine (FLEXERIL) 5 MG tablet, Take 1 tablet (5 mg total) by mouth 3 (three) times daily as needed for muscle spasms (Leg pain)., Disp: 90 tablet, Rfl: 1 .  eplerenone (INSPRA) 25 MG tablet, Take 1 tablet (25 mg total) daily by mouth., Disp: 30 tablet, Rfl: 3 .  furosemide (LASIX) 40 MG tablet, Take 0.5 tablets (20 mg total) by mouth daily., Disp: 30 tablet, Rfl: 6 .  gabapentin (NEURONTIN) 300 MG capsule, Take 1 capsule (300 mg total) by mouth 2 (two) times daily., Disp: 180 capsule, Rfl: 0 .  isosorbide-hydrALAZINE (BIDIL) 20-37.5 MG tablet, Take 2 tablets by mouth 3 (three) times daily., Disp: 180 tablet, Rfl: 6 .  rosuvastatin (CRESTOR) 5 MG tablet, TAKE 1 TABLET BY MOUTH EVERY DAY, Disp: 30 tablet, Rfl: 3 .  sacubitril-valsartan (ENTRESTO) 49-51 MG, Take 1 tablet by mouth 2 (two) times daily., Disp: 60 tablet, Rfl: 11 No Known Allergies   Social History   Socioeconomic History  . Marital  status: Married    Spouse name: Not on file  . Number of children: Not on file  . Years of education: Not on file  . Highest education level: Not on file  Social Needs  . Financial resource strain: Not on file  . Food insecurity - worry: Not on file  . Food insecurity - inability: Not on file  . Transportation needs - medical: Not on file  . Transportation needs - non-medical: Not on file  Occupational History  . Occupation: Engineer, manufacturing systems    Comment: has not been able to work steadily for the last year or two  Tobacco Use  . Smoking status: Current Every Day Smoker    Packs/day: 0.30    Years: 48.00    Pack years: 14.40    Types: Cigarettes  . Smokeless tobacco: Never Used  . Tobacco comment: cutting back 1 per day  Substance and Sexual Activity  . Alcohol use: Yes    Alcohol/week: 3.6 oz    Types: 6 Cans of beer per week  . Drug use: Yes    Types: Marijuana, Cocaine  . Sexual activity: Yes    Birth control/protection: None  Other Topics Concern  . Not on file  Social History Narrative  . Not on file    Physical Exam  Pulmonary/Chest: No respiratory distress. He has no wheezes. He has no rales.  Abdominal: He exhibits no distension. There is no tenderness. There is no guarding.  Musculoskeletal: He exhibits no edema.  Skin: Skin is warm  and dry. He is not diaphoretic.        Future Appointments  Date Time Provider [place]  10/28/2017 10:20 AM Larey Dresser, MD [facility] None    ATF pt CAO x4 sitting in the livingroom with no complaints. Pt had an MRI on this past sat, to verify if he has a pinched nerve. Pt stated that the catheter went through the groin area, which he's now sore from. Pt has hx of chronic pain in his hips and legs since 2012. Pt denies sob, dizziness, and chest pain since our last visit. He stated that he is no longer sore around his breast area since he stopped taking the spirolactone. Pt has taken all of his medications without  missing any.  He continues to receive his meds through [facility] whom delivers to him monthly.  Today is his last doses from last months delivery, the pharmacist stated that they will put him down for delivery today.    Pt is still smoking cigarettes; "a few a week" and he drunk a beer yesterday.  I advised pt of the consequences of drinking alcohol with a weak heart.  Pt asked about the nicatin patch.  I advised him that I would see If the physicians at the heart clinic could help him with a rx. rx packs verified.  Pt stated that he hasn't weighed himself in about 2 weeks.  We talked about he importance of him weighing daily and keeping up with his weights.    BP 108/70 (BP Location: Left Arm, Patient Position: Sitting, Cuff Size: Normal)   Pulse 70   Resp 16   Wt 210 lb (95.3 kg)   SpO2 98%   BMI 28.48 kg/m   Weight yesterday-hasn't weighed in over 2 weeks Last visit weight-210    Mohid Furuya, EMT Paramedic 07/23/2017    ACTION: Home visit completed Next visit planned for next month

## 2017-08-20 ENCOUNTER — Other Ambulatory Visit (HOSPITAL_COMMUNITY): Payer: Self-pay

## 2017-08-20 MED ORDER — NICOTINE 14 MG/24HR TD PT24: 14.0000 mg | MEDICATED_PATCH | Freq: Every day | TRANSDERMAL | 0 refills | Status: AC

## 2017-08-26 ENCOUNTER — Other Ambulatory Visit (HOSPITAL_COMMUNITY): Payer: Self-pay | Admitting: Cardiology

## 2017-08-27 ENCOUNTER — Other Ambulatory Visit: Payer: Self-pay | Admitting: Internal Medicine

## 2017-09-08 ENCOUNTER — Telehealth: Payer: Self-pay

## 2017-09-08 DIAGNOSIS — M79604 Pain in right leg: Secondary | ICD-10-CM

## 2017-09-08 DIAGNOSIS — M79605 Pain in left leg: Principal | ICD-10-CM

## 2017-09-08 NOTE — Telephone Encounter (Signed)
Requesting MRI result. Please call back.  

## 2017-09-09 NOTE — Telephone Encounter (Addendum)
Discussed spinal MRI results with the patient over the phone. States he continues to have severe back pain that radiates to his bilateral lower extremities and is associated with numbness in his feet. In addition, he reports having intermittent groin numbness. Denies having any urinary or fecal incontinence. He has tried medications including gabapentin and muscle relaxers which do not help. States he has never been to physical therapy. Plan: -Referral to PT -Referral to neurosurgery because patient has failed to have adequate clinical response to conservative therapy and is functionally disabled by his symptoms. In addition, the intermittent groin numbness is also concerning.

## 2017-09-10 ENCOUNTER — Other Ambulatory Visit (HOSPITAL_COMMUNITY): Payer: Self-pay

## 2017-09-10 ENCOUNTER — Encounter (HOSPITAL_COMMUNITY): Payer: Self-pay

## 2017-09-10 NOTE — Progress Notes (Signed)
Paramedicine Encounter    Patient ID: Edward Mullen, male    DOB: 1955/11/13, 62 y.o.   MRN: [id number]    Patient Care Team: Shela Leff, MD as PCP - General  Patient Active Problem List   Diagnosis Date Noted  . Need for immunization against influenza 04/29/2017  . Anemia 12/19/2016  . CKD (chronic kidney disease) stage 3, GFR 30-59 ml/min (HCC) 12/13/2016  . Chronic systolic heart failure ([place]) 09/06/2016  . NSVT (nonsustained ventricular tachycardia) ([place]) 09/06/2016  . Non-ischemic cardiomyopathy ([place]) 08/27/2016  . Non-obstructive hypertrophic cardiomyopathy ([place]) 08/27/2016  . Mitral regurgitation 08/27/2016  . Polysubstance abuse ([place])   . Blurred vision, bilateral 09/20/2014  . Tobacco abuse 09/19/2014  . HTN (hypertension) 03/28/2011  . Bilateral lower extremity pain 03/28/2011  . Erectile dysfunction 03/28/2011    Current Outpatient Medications:  .  aspirin 81 MG chewable tablet, Chew 1 tablet (81 mg total) by mouth daily., Disp: 90 tablet, Rfl: 3 .  cyclobenzaprine (FLEXERIL) 5 MG tablet, TAKE ONE TABLET BY MOUTH THREE TIMES A DAY. AS NEEDED FOR MUSCLE SPASMS (LEG PAIN), Disp: 30 tablet, Rfl: 0 .  furosemide (LASIX) 40 MG tablet, Take 0.5 tablets (20 mg total) by mouth daily., Disp: 45 tablet, Rfl: 3 .  gabapentin (NEURONTIN) 300 MG capsule, Take 1 capsule (300 mg total) by mouth 2 (two) times daily., Disp: 180 capsule, Rfl: 0 .  isosorbide-hydrALAZINE (BIDIL) 20-37.5 MG tablet, Take 2 tablets by mouth 3 (three) times daily., Disp: 180 tablet, Rfl: 6 .  rosuvastatin (CRESTOR) 5 MG tablet, TAKE 1 TABLET BY MOUTH EVERY DAY, Disp: 30 tablet, Rfl: 3 .  sacubitril-valsartan (ENTRESTO) 49-51 MG, Take 1 tablet by mouth 2 (two) times daily., Disp: 60 tablet, Rfl: 11 .  eplerenone (INSPRA) 25 MG tablet, Take 1 tablet (25 mg total) daily by mouth., Disp: 30 tablet, Rfl: 3 .  nicotine (NICODERM CQ - DOSED IN MG/24 HOURS) 14 mg/24hr patch, Place 1 patch (14 mg total)  onto the skin daily. For 4 weeks, then dosage will decrease, Disp: 30 patch, Rfl: 0 No Known Allergies   Social History   Socioeconomic History  . Marital status: Married    Spouse name: Not on file  . Number of children: Not on file  . Years of education: Not on file  . Highest education level: Not on file  Social Needs  . Financial resource strain: Not on file  . Food insecurity - worry: Not on file  . Food insecurity - inability: Not on file  . Transportation needs - medical: Not on file  . Transportation needs - non-medical: Not on file  Occupational History  . Occupation: Engineer, manufacturing systems    Comment: has not been able to work steadily for the last year or two  Tobacco Use  . Smoking status: Current Every Day Smoker    Packs/day: 0.30    Years: 48.00    Pack years: 14.40    Types: Cigarettes  . Smokeless tobacco: Never Used  . Tobacco comment: cutting back 1 per day  Substance and Sexual Activity  . Alcohol use: Yes    Alcohol/week: 3.6 oz    Types: 6 Cans of beer per week  . Drug use: Yes    Types: Marijuana, Cocaine  . Sexual activity: Yes    Birth control/protection: None  Other Topics Concern  . Not on file  Social History Narrative  . Not on file    Physical Exam  Pulmonary/Chest: No respiratory distress. He  has no wheezes. He has no rales.  Abdominal: He exhibits no distension. There is no tenderness. There is no guarding.  Genitourinary: No penile tenderness.  Musculoskeletal: He exhibits no edema.  Skin: Skin is warm and dry. He is not diaphoretic.        Future Appointments  Date Time Provider [place]  10/28/2017 10:20 AM Larey Dresser, MD [facility] None    ATF pt CAO x4 sitting in the living room with no complaints.  Pt stated that he got the MRI results today and it is confirmed that he does have a pinch nerve and they will f/u with him about future therapy.  Pt is still living with his daughter and he is very eager to move out on  his own.  Pt has been taking all of his medications without difficulty.  Pt's rx packs verified and the he is missing the 2nd pill of bidil in all 3 slots each pill pack.  I will drop the pill packs off for them to correct due to pt unable to take them himself.    Today's pt's last CHP visit.  Pt has been doing very well understanding his disease.  He is keeping up with the low sodium diet and he hasn't had any alcohol in several weeks. Pt stated that he weighs "every now and then". We discussed the importance of him weighing daily and keeping a record in the mornings.  Pt agreed to discontinuing the [facility] visits.  I advised pt that he is welcomed to request our services if needed.     BP (!) 118/98 (BP Location: Left Arm, Patient Position: Sitting, Cuff Size: Normal)   Pulse 86   Resp 16   Wt 210 lb (95.3 kg)   SpO2 98%   BMI 28.48 kg/m   Weight yesterday-didn't weigh Last visit weight-210 lb    Lovelace Cerveny, EMT Paramedic 09/10/2017    ACTION: Home visit completed

## 2017-09-30 ENCOUNTER — Other Ambulatory Visit (HOSPITAL_COMMUNITY): Payer: Self-pay | Admitting: Internal Medicine

## 2017-09-30 ENCOUNTER — Other Ambulatory Visit (HOSPITAL_COMMUNITY): Payer: Self-pay | Admitting: Cardiology

## 2017-09-30 ENCOUNTER — Other Ambulatory Visit: Payer: Self-pay | Admitting: Internal Medicine

## 2017-09-30 DIAGNOSIS — M79604 Pain in right leg: Secondary | ICD-10-CM

## 2017-09-30 DIAGNOSIS — M79605 Pain in left leg: Principal | ICD-10-CM

## 2017-10-02 ENCOUNTER — Ambulatory Visit: Payer: Medicaid Other | Attending: Internal Medicine

## 2017-10-07 NOTE — Addendum Note (Signed)
Addended by: Hulan Fray on: 10/07/2017 06:55 PM   Modules accepted: Orders

## 2017-10-28 ENCOUNTER — Encounter (HOSPITAL_COMMUNITY): Payer: Self-pay | Admitting: Cardiology

## 2017-10-28 ENCOUNTER — Ambulatory Visit (HOSPITAL_COMMUNITY)
Admission: RE | Admit: 2017-10-28 | Discharge: 2017-10-28 | Disposition: A | Discharge status: Home / Self Care | Payer: Medicaid Other | Source: Ambulatory Visit | Attending: Cardiology | Admitting: Cardiology

## 2017-10-28 ENCOUNTER — Other Ambulatory Visit: Payer: Self-pay

## 2017-10-28 VITALS — BP 115/80 | HR 77 | Wt 215.8 lb

## 2017-10-28 DIAGNOSIS — I130 Hypertensive heart and chronic kidney disease with heart failure and stage 1 through stage 4 chronic kidney disease, or unspecified chronic kidney disease: Secondary | ICD-10-CM | POA: Diagnosis not present

## 2017-10-28 DIAGNOSIS — Z79899 Other long term (current) drug therapy: Secondary | ICD-10-CM | POA: Diagnosis not present

## 2017-10-28 DIAGNOSIS — F1410 Cocaine abuse, uncomplicated: Secondary | ICD-10-CM | POA: Diagnosis not present

## 2017-10-28 DIAGNOSIS — Z7982 Long term (current) use of aspirin: Secondary | ICD-10-CM | POA: Insufficient documentation

## 2017-10-28 DIAGNOSIS — N183 Chronic kidney disease, stage 3 (moderate): Secondary | ICD-10-CM | POA: Diagnosis not present

## 2017-10-28 DIAGNOSIS — I2510 Atherosclerotic heart disease of native coronary artery without angina pectoris: Secondary | ICD-10-CM | POA: Insufficient documentation

## 2017-10-28 DIAGNOSIS — I5022 Chronic systolic (congestive) heart failure: Secondary | ICD-10-CM | POA: Insufficient documentation

## 2017-10-28 DIAGNOSIS — E1122 Type 2 diabetes mellitus with diabetic chronic kidney disease: Secondary | ICD-10-CM | POA: Diagnosis not present

## 2017-10-28 DIAGNOSIS — F1010 Alcohol abuse, uncomplicated: Secondary | ICD-10-CM | POA: Insufficient documentation

## 2017-10-28 DIAGNOSIS — F17210 Nicotine dependence, cigarettes, uncomplicated: Secondary | ICD-10-CM | POA: Diagnosis not present

## 2017-10-28 DIAGNOSIS — F1910 Other psychoactive substance abuse, uncomplicated: Secondary | ICD-10-CM | POA: Diagnosis not present

## 2017-10-28 DIAGNOSIS — I428 Other cardiomyopathies: Secondary | ICD-10-CM | POA: Diagnosis not present

## 2017-10-28 LAB — BASIC METABOLIC PANEL
Anion gap: 9 (ref 5–15)
BUN: 16 mg/dL (ref 6–20)
CO2: 24 mmol/L (ref 22–32)
Calcium: 8.9 mg/dL (ref 8.9–10.3)
Chloride: 106 mmol/L (ref 101–111)
Creatinine, Ser: 1.37 mg/dL — ABNORMAL HIGH (ref 0.61–1.24)
GFR calc Af Amer: >60 mL/min (ref >60)
GFR calc non Af Amer: 53 mL/min — ABNORMAL LOW (ref >60)
Glucose, Bld: 94 mg/dL (ref 65–99)
Potassium: 4 mmol/L (ref 3.5–5.1)
Sodium: 139 mmol/L (ref 135–145)

## 2017-10-28 NOTE — Progress Notes (Signed)
Advanced Heart Failure Clinic Note   Primary Care: Dr. Marlowe Sax Primary Cardiologist: Dr. Aundra Dubin   HPI: Edward Mullen is a 62 y.o. male with history of HTN, DM, chronic systolic CHF, and polysubstance abuse.   Admitted 08/23/16 with acute systolic CHF in setting of substance abuse. Echo was done, showing EF 15-20%.  Diuresed with IV lasix and meds adjusted as tolerated.  UDS + for cocaine on admission. Cardiac cath showed coronary disease but not significant enough to explain cardiomyopathy. Down 23 lbs from admission weight with diuresis.  Discharge weight 193 lbs.  He was positive for cocaine again in 2/18.    He was admitted with syncope in 5/18 after using cocaine.  He was noted to have NSVT on telemetry.  Syncope suspected due to VT, no ICD given active substance abuse but had Lifevest placed. EF 50% on 8/18 echo, so Lifevest subsequently removed.  He presents today for followup of CHF.  He is not using any cocaine.  He drinks about 1 beer/week and smokes 1 cigarette/week.  No significant exertional dyspnea.  Rare, very atypical chest pain.  No orthopnea/PND.  No lightheadedness.  Main problems is low back pain.  He has had an MRI and is going to see a spine doctor.  He has sharp pain shooting down his left leg.     ECG (personally reviewed): NSR, IVCD 136 msec, inferolateral TWIs.   Labs (1/18): Digoxin 0.4, BNP 961 Labs (2/18): K 4, creatinine 1.38 Labs (3/18): K 4.4, creatinine 1.85, digoxin 0.4 Labs (5/18): hgb 12.6, K 4.3, creatinine 1.3, digoxin 0.5 Labs (6/18): TSH normal, K 4.4, creatinine 2.04, AST 42, ALT 37 Labs (8/18): K 4.8, creatinine 1.65 Labs (11/18): K 4.1, creatinine 1.77  ROS: All systems reviewed and negative except as per HPI.   Social History: Lives in [place] with daughter. Prior heavy ETOH, now 1 beer/week.  Smokes 1-2 cigs/day. Has quit using cocaine.  Active marijuana.   Family History  Problem Relation Age of Onset  . Stroke Mother 29  . Heart  disease Father 63   Past Medical History: 1. HTN  2. Type II diabetes 3. Cocaine abuse 4. Active smoking, suspect COPD.  5. Cardiomyopathy: Nonischemic.  - Echo (1/18) with EF 15-20%, moderate LVH, moderate AI, moderate to severe MR, normal RV size with mildly decreased systolic function, PASP 44 mmHg.  - LHC/RHC (1/18): 80% stenosis in PLV branch.  Mean RA 5, PA 47/20 mean 32, mean PCWP 22, CI 2.01.  - Echo (8/18): EF 50% with moderate LVH, diffuse hypokinesis, normal RV size and systolic function, mild AI, trivial MR.  6. CAD: LHC (1/18) with 80% stenosis in a branch of the PLV (not enough CAD to explain cardiomyopathy).  7. Mitral regurgitation: Moderate to severe on 1/18 echo, probably functional.  Trivial MR on 8/18 echo.  8. CKD: Stage 3.  9. Syncope: 5/18 in setting of cocaine use, concern for VT.  10. Lower extremity arterial dopplers (7/18): No significant PAD.  11. Sciatica   Current Outpatient Medications  Medication Sig Dispense Refill  . aspirin 81 MG chewable tablet Chew 1 tablet (81 mg total) by mouth daily. 90 tablet 3  . cyclobenzaprine (FLEXERIL) 5 MG tablet TAKE ONE TABLET BY MOUTH THREE TIMES A DAY. AS NEEDED FOR MUSCLE SPASMS (LEG PAIN) 30 tablet 0  . eplerenone (INSPRA) 25 MG tablet Take 1 tablet (25 mg total) daily by mouth. 30 tablet 3  . gabapentin (NEURONTIN) 300 MG capsule TAKE ONE CAPSULE  BY MOUTH TWICE DAILY 60 capsule 2  . isosorbide-hydrALAZINE (BIDIL) 20-37.5 MG tablet Take 2 tablets by mouth 3 (three) times daily. 180 tablet 6  . rosuvastatin (CRESTOR) 5 MG tablet TAKE ONE TABLET BY MOUTH ONCE DAILY 30 tablet 3  . sacubitril-valsartan (ENTRESTO) 49-51 MG Take 1 tablet by mouth 2 (two) times daily. 60 tablet 11   No current facility-administered medications for this encounter.     No Known Allergies    Vitals:   10/28/17 1014  BP: 115/80  Pulse: 77  SpO2: 99%  Weight: 215 lb 12 oz (97.9 kg)   Wt Readings from Last 3 Encounters:  10/28/17 215  lb 12 oz (97.9 kg)  09/10/17 210 lb (95.3 kg)  07/23/17 210 lb (95.3 kg)     PHYSICAL EXAM: General: NAD Neck: No JVD, no thyromegaly or thyroid nodule.  Lungs: Clear to auscultation bilaterally with normal respiratory effort. CV: Nondisplaced PMI.  Heart regular S1/S2, no S3/S4, no murmur.  No peripheral edema.  No carotid bruit.  Normal pedal pulses.  Abdomen: Soft, nontender, no hepatosplenomegaly, no distention.  Skin: Intact without lesions or rashes.  Neurologic: Alert and oriented x 3.  Psych: Normal affect. Extremities: No clubbing or cyanosis.  HEENT: Normal.   ASSESSMENT & PLAN:  1. Chronic systolic CHF: EF 00-76% with moderate to severe MR on echo 1/18. Nonischemic cardiomyopathy. HTN vs cocaine use vs viral vs ETOH. HIV negative, SPEP with no M-spike. Patient does have CAD but not enough to explain cardiomyopathy.  Echo in 8/18 showed EF up to 50%.  Today, NYHA class II symptoms and no volume overload on exam. - I think he can stop Lasix.  - I will get repeat echo at 1 year in 8/19 to make sure EF remains improved.  - Continue eplerenone (breast tenderness with spironolactone, now resolved).    - Not on Coreg given h/o cocaine abuse (he has now stopped).  Will hold off on starting Coreg given improvement in EF to 50%.    - Continue Entresto 49/51 bid.  - Continue Bidil 2 tabs tid.   - Continue to avoid cocaine and ETOH => abstinence may have helped LV function.   - EF too high for ICD at this point.   2. HTN: BP controlled on current meds.     3. Polysubstance abuse:  I encouraged him to stay off cocaine and heavy ETOH.    4. NSVT: Short runs during last hospitalization.  Concern that VT caused 5/18 syncope (no recurrence). EF has now improved to 50%.  5. Smoking: I encouraged him to quit completely (smokes 1 cig/week).  6. CKD: Stage 3.  BMET today.  - Will stop Lasix.  7. CAD: 80% stenosis PLV on cath.  No ischemic chest pain.  Continue ASA 81 daily and Crestor 5 mg  daily.    Followup in 8/19 with echo.   Loralie Champagne, MD 10/28/17

## 2017-10-28 NOTE — Patient Instructions (Signed)
Stop Furosemide (Lasix)  Labs today  Your physician recommends that you schedule a follow-up appointment in: August with echocardiogram

## 2017-11-13 ENCOUNTER — Ambulatory Visit: Payer: Medicaid Other | Attending: Internal Medicine | Admitting: Physical Therapy

## 2017-12-04 ENCOUNTER — Other Ambulatory Visit (HOSPITAL_COMMUNITY): Payer: Self-pay | Admitting: Internal Medicine

## 2017-12-04 ENCOUNTER — Other Ambulatory Visit (HOSPITAL_COMMUNITY): Payer: Self-pay | Admitting: Cardiology

## 2018-01-08 ENCOUNTER — Telehealth (HOSPITAL_COMMUNITY): Payer: Self-pay | Admitting: *Deleted

## 2018-01-08 ENCOUNTER — Other Ambulatory Visit: Payer: Self-pay | Admitting: Internal Medicine

## 2018-01-08 NOTE — Telephone Encounter (Signed)
Submitted Entresto 49-51mg  PA through [employer].  Conf# [id number].  Will check back for approval.

## 2018-01-09 NOTE — Telephone Encounter (Signed)
PA approved for Entresto from 01/08/18 through 01/03/19.  PA# [id number]

## 2018-01-13 ENCOUNTER — Encounter: Payer: Medicaid Other | Admitting: Internal Medicine

## 2018-01-13 ENCOUNTER — Encounter: Payer: Self-pay | Admitting: Internal Medicine

## 2018-01-24 IMAGING — CR DG CHEST 2V
2 series · 2 of 2 positions shown · non-contrast
Comparison: None.

CLINICAL DATA: Chest pain for 2 days

EXAM:
CHEST  2 VIEW

[w chest pa]
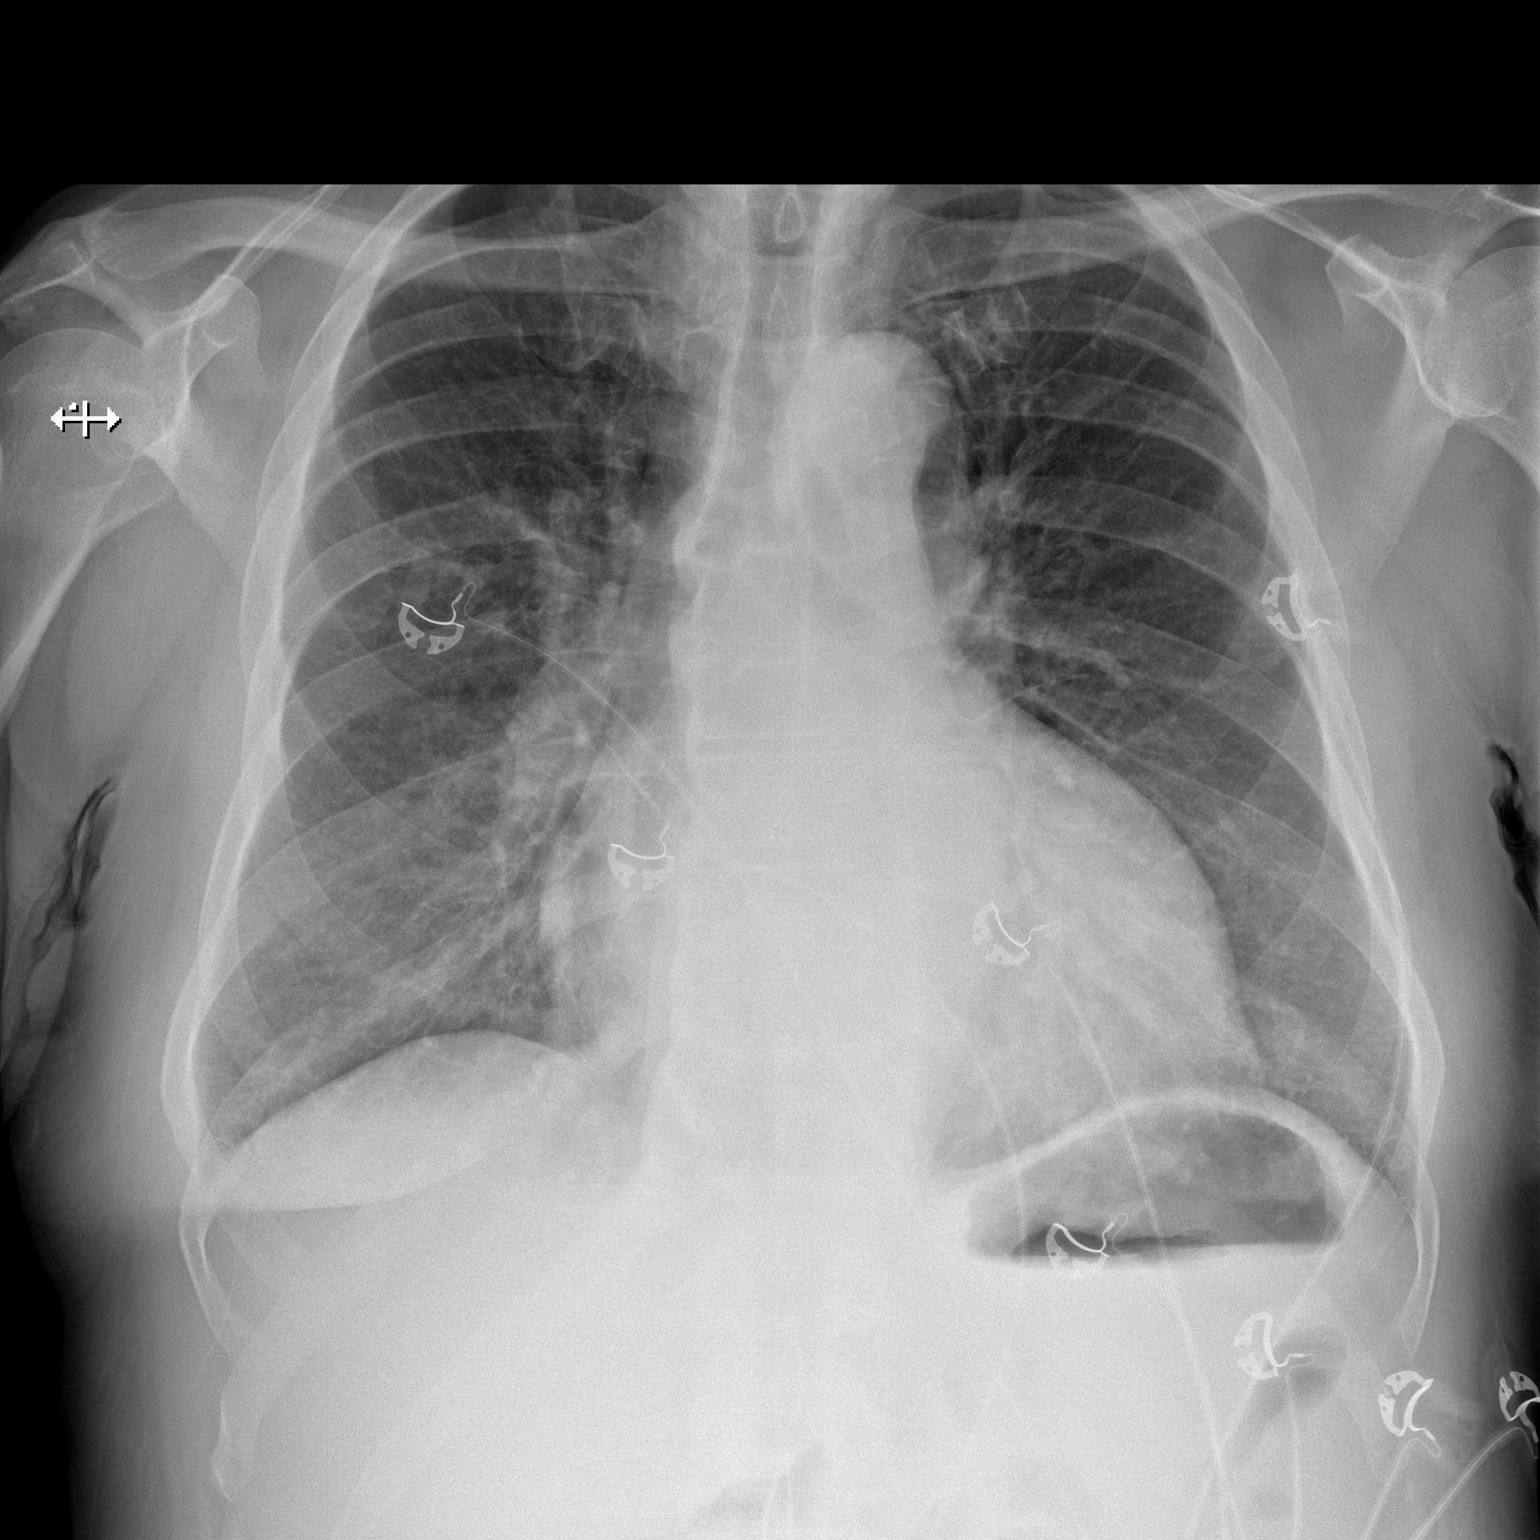

[w chest lat]
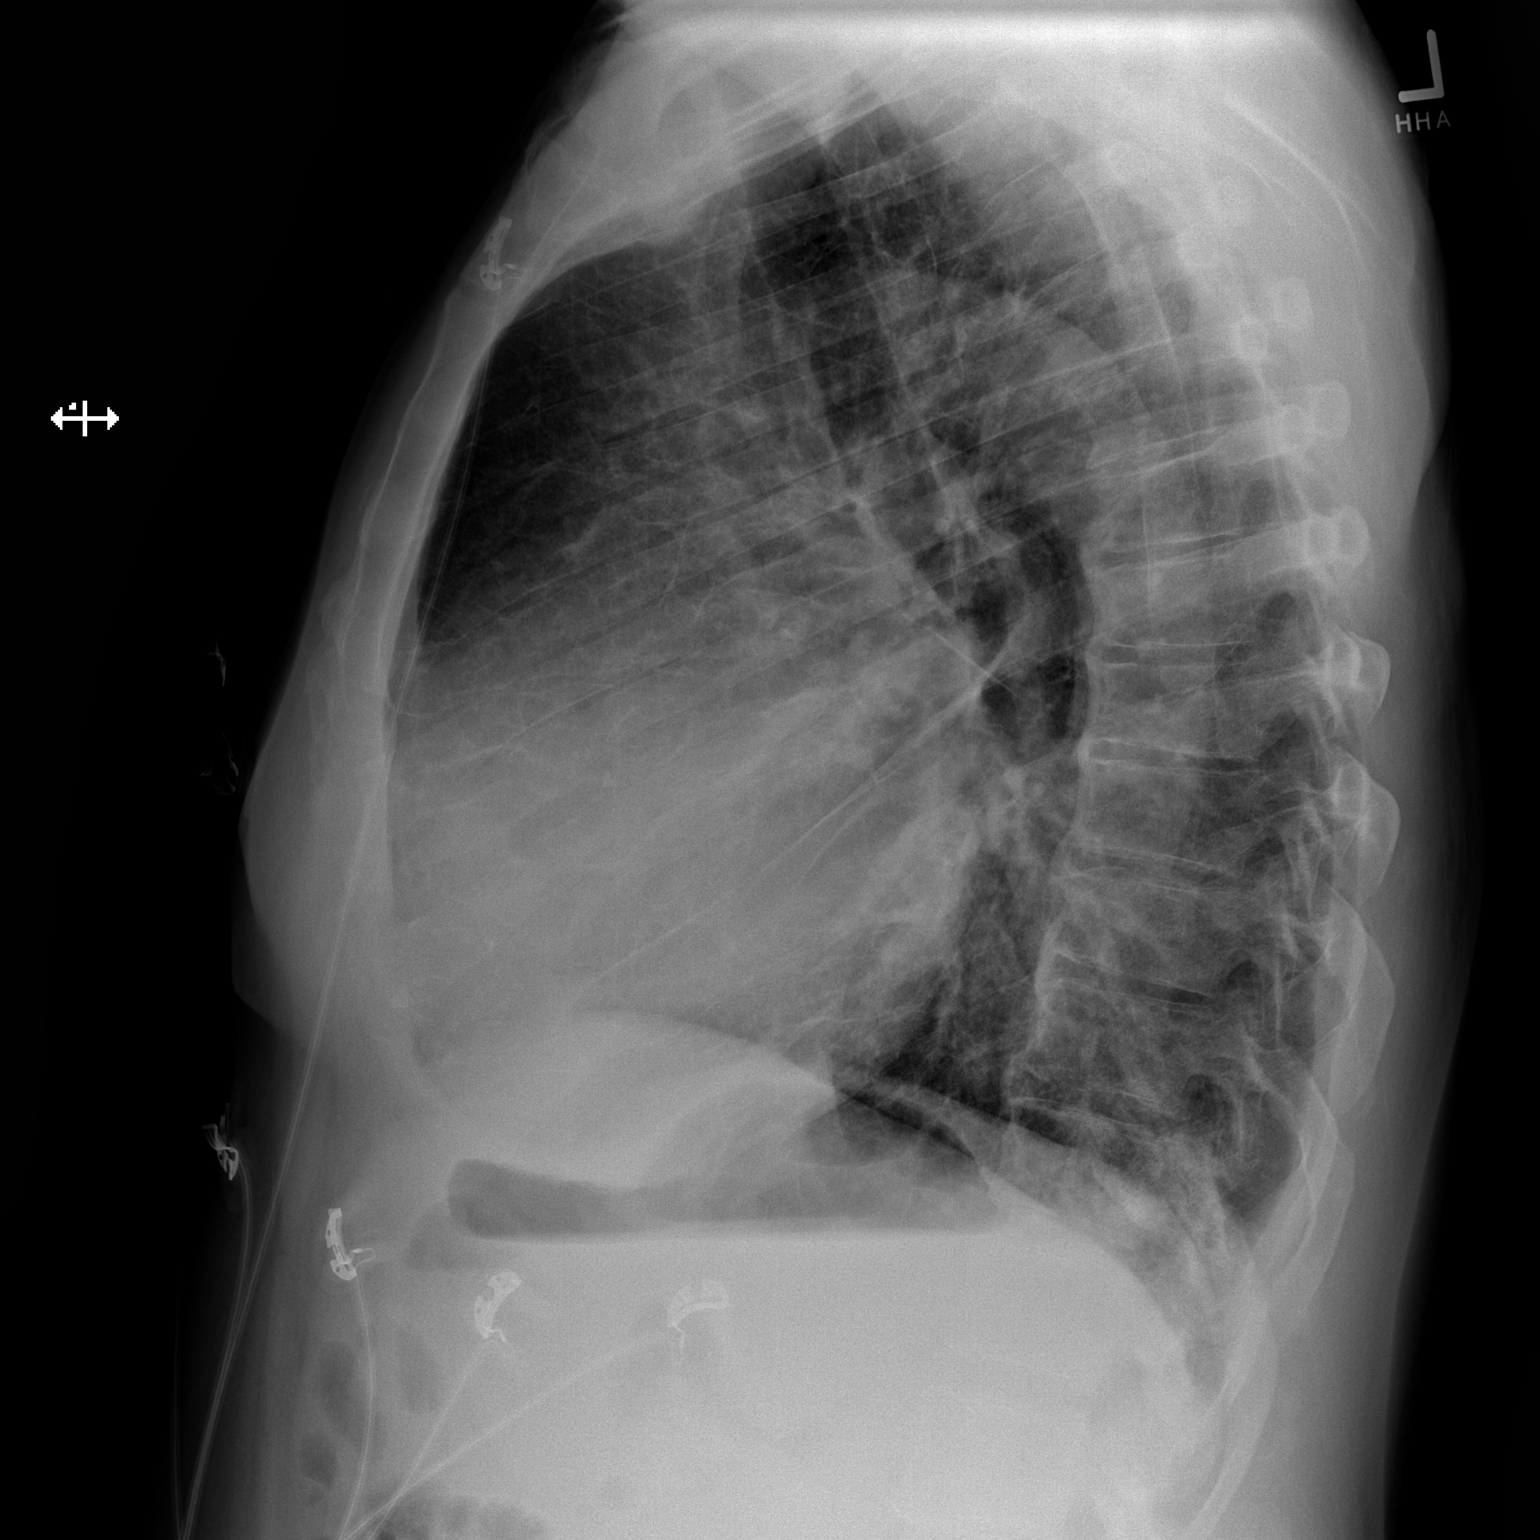

[2 of 2 positions shown; findings below may reference images not displayed]

FINDINGS: Cardiac shadow is mildly enlarged. The lungs are well aerated
bilaterally. No focal infiltrate or sizable effusion is seen. No
acute bony abnormality is noted.
IMPRESSION: No active cardiopulmonary disease.

## 2018-02-04 ENCOUNTER — Encounter: Payer: Self-pay | Admitting: *Deleted

## 2018-02-07 ENCOUNTER — Other Ambulatory Visit (HOSPITAL_COMMUNITY): Payer: Self-pay | Admitting: Cardiology

## 2018-02-11 IMAGING — CR DG CHEST 2V
2 series · 2 of 2 positions shown · non-contrast
Comparison: 08/05/2016

CLINICAL DATA: Lower extremity swelling today. Shortness of breath
for 2 days. Hypertension. Smoker. History of CHF. Diabetes.

EXAM:
CHEST  2 VIEW

[chest pa]
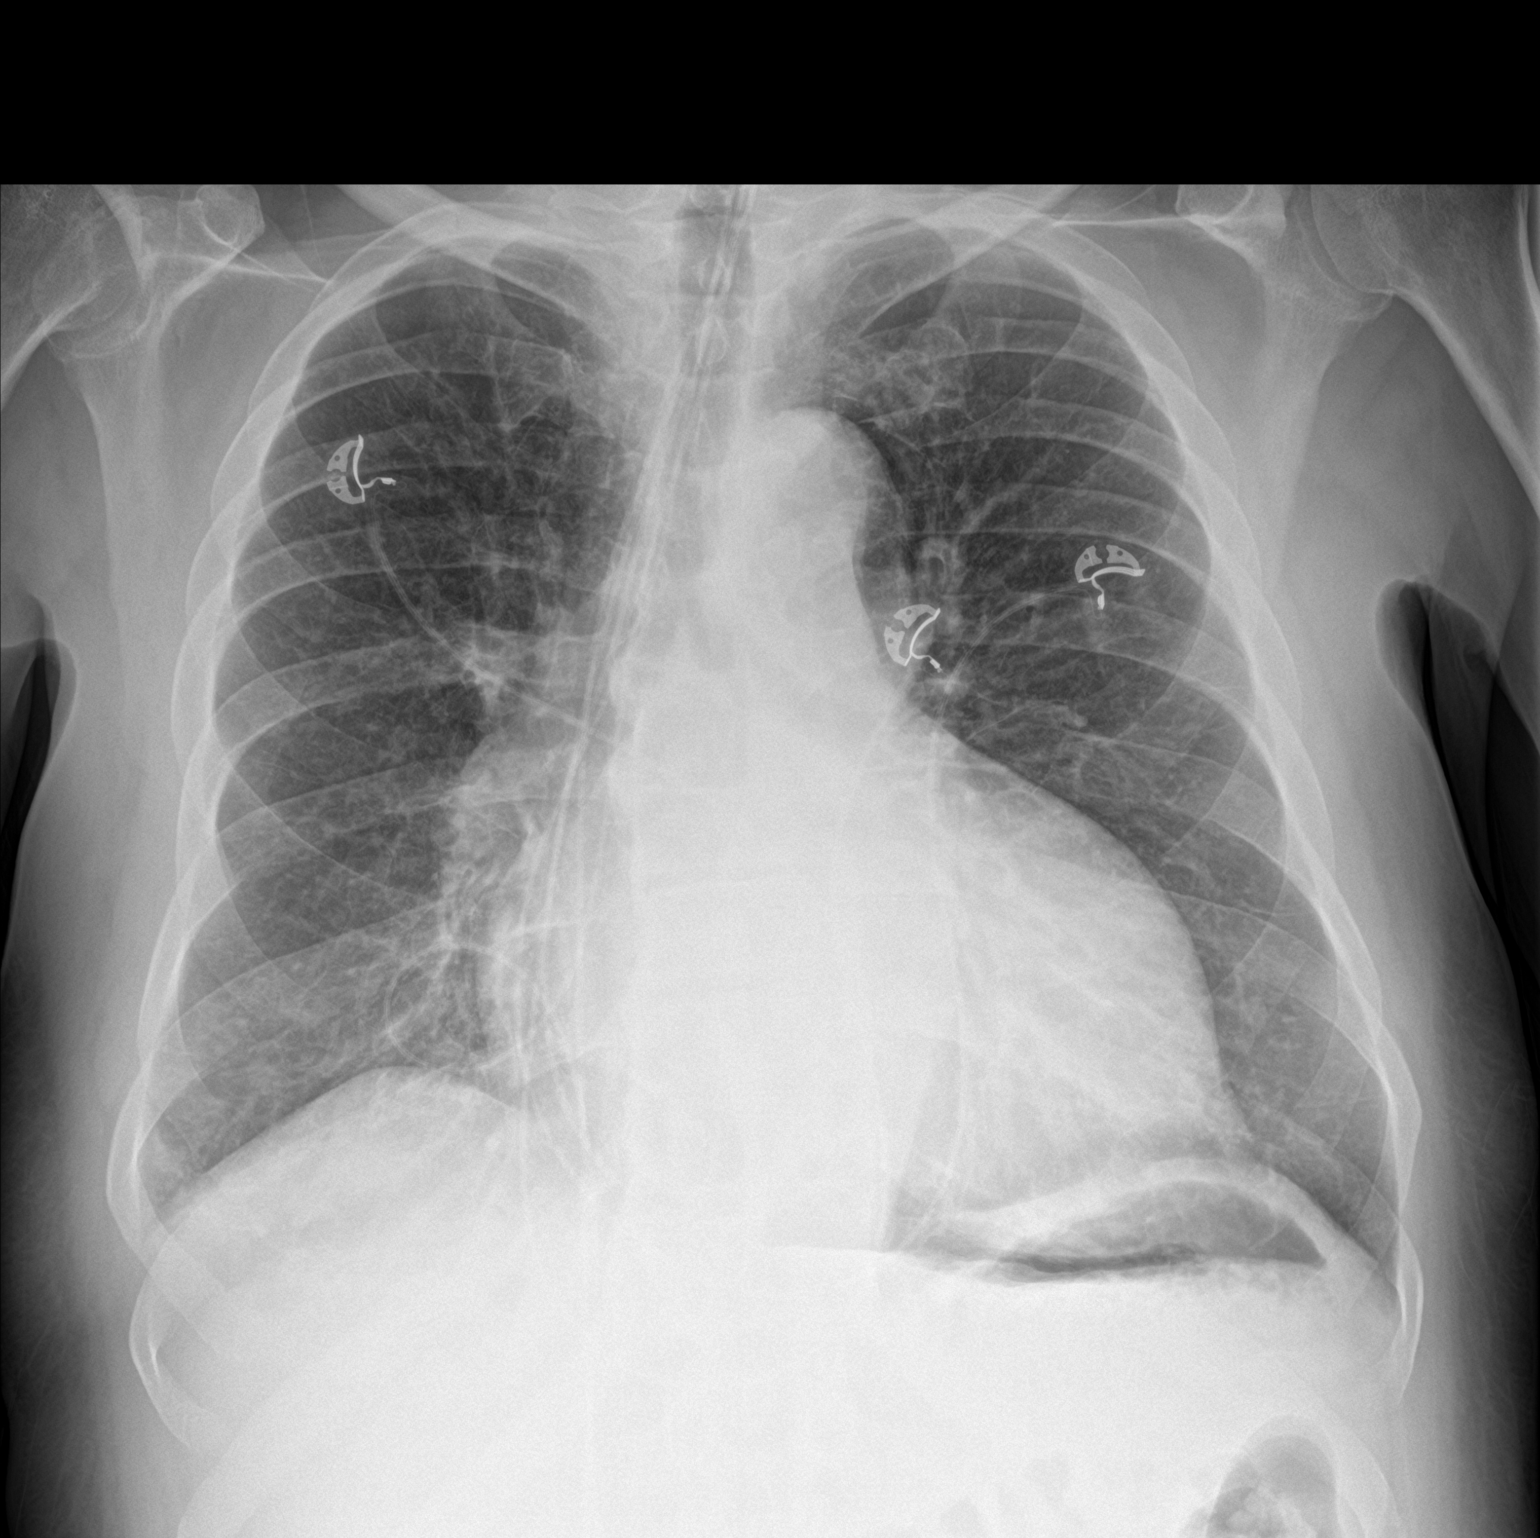

[chest lat]
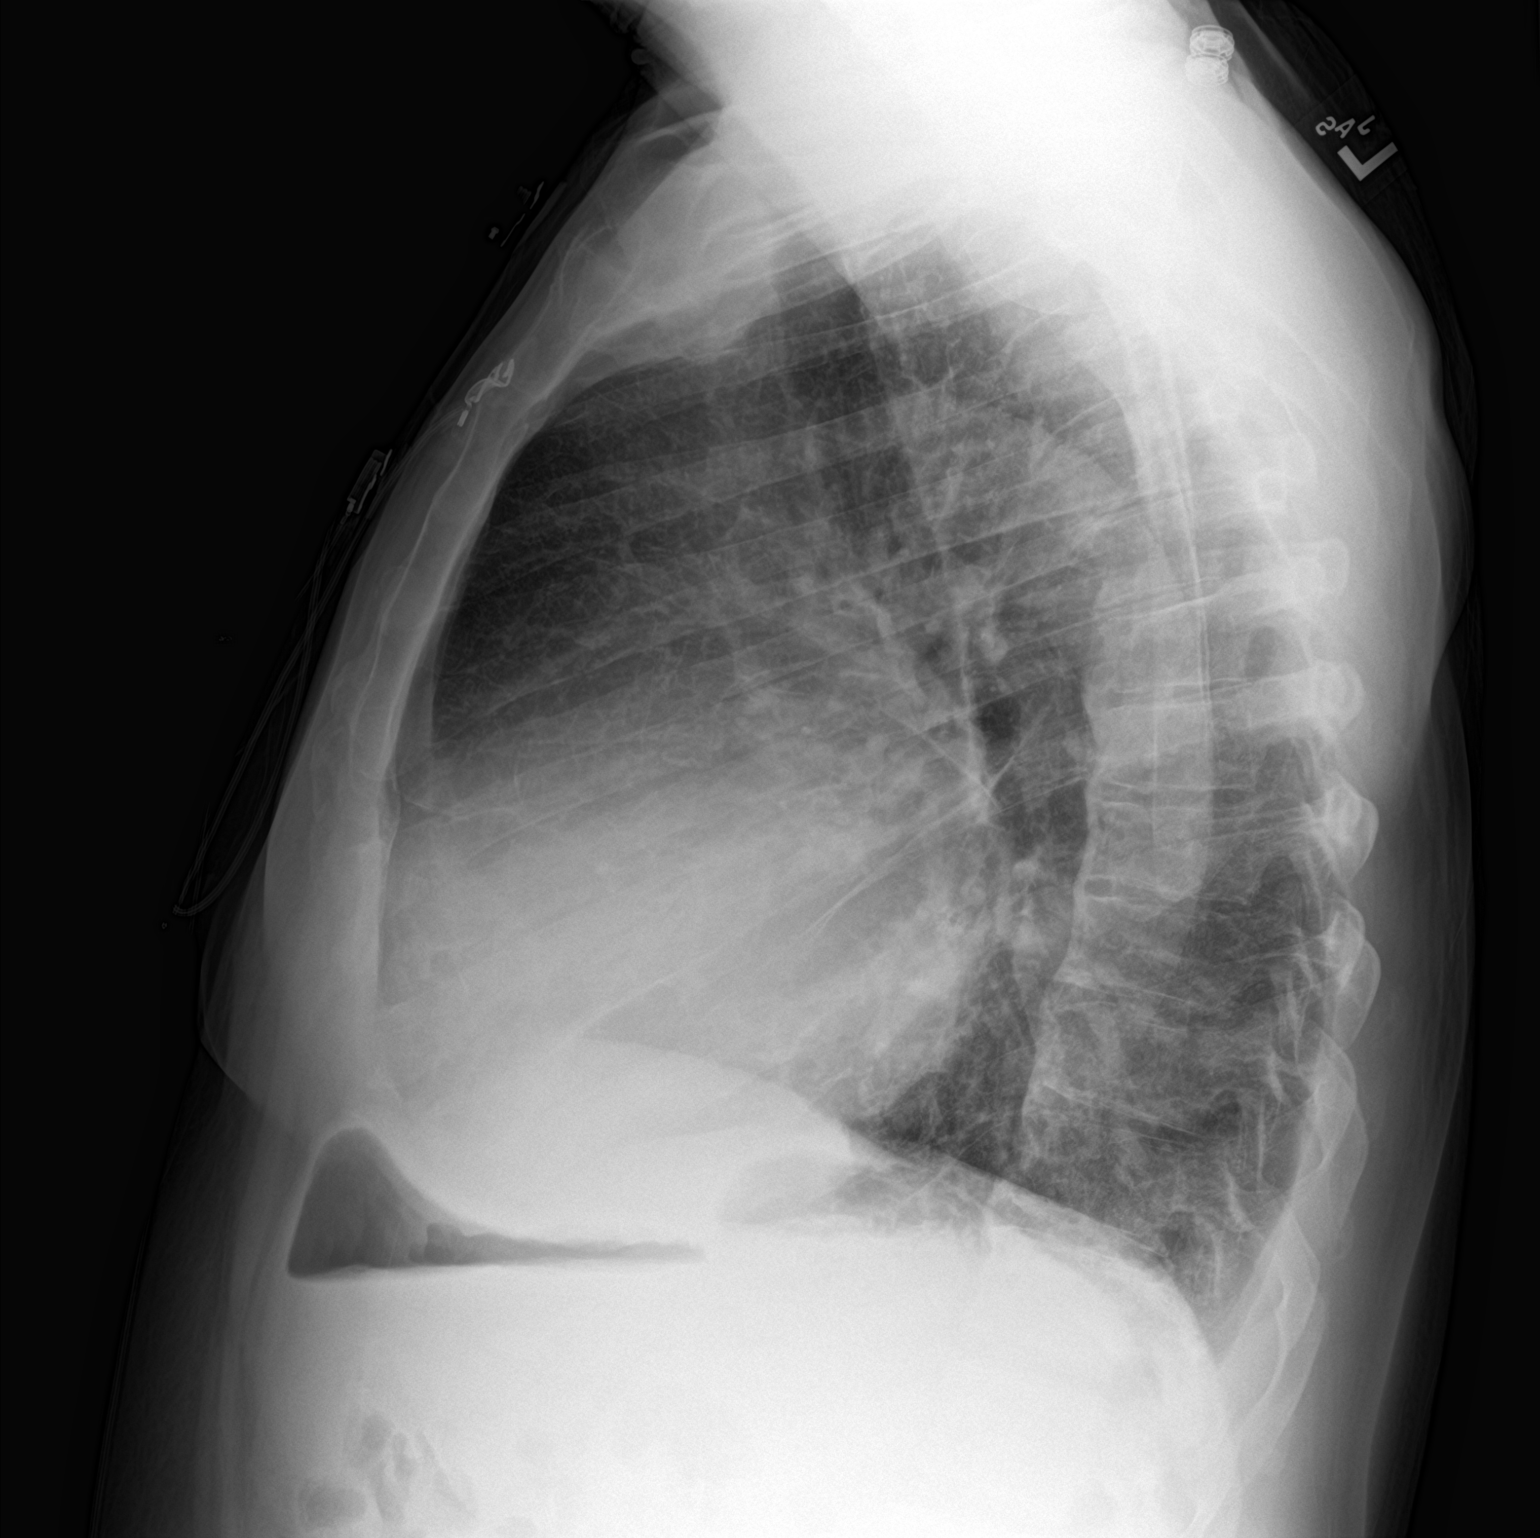

[2 of 2 positions shown; findings below may reference images not displayed]

FINDINGS: Mild cardiac enlargement without vascular congestion. No focal
consolidation or airspace disease in the lungs. No blunting of
costophrenic angles. No pneumothorax. Tortuous aorta. Degenerative
changes in the spine and shoulders.
IMPRESSION: Mild cardiac enlargement.  No evidence of active pulmonary disease.

## 2018-03-17 ENCOUNTER — Other Ambulatory Visit (HOSPITAL_COMMUNITY): Payer: Self-pay | Admitting: Cardiology

## 2018-04-07 ENCOUNTER — Other Ambulatory Visit: Payer: Self-pay

## 2018-04-07 ENCOUNTER — Ambulatory Visit: Payer: Medicaid Other | Admitting: Internal Medicine

## 2018-04-07 ENCOUNTER — Encounter: Payer: Self-pay | Admitting: Internal Medicine

## 2018-04-07 VITALS — BP 116/73 | HR 82 | Temp 98.0°F | Ht 72.0 in | Wt 207.9 lb

## 2018-04-07 DIAGNOSIS — I10 Essential (primary) hypertension: Secondary | ICD-10-CM

## 2018-04-07 DIAGNOSIS — I130 Hypertensive heart and chronic kidney disease with heart failure and stage 1 through stage 4 chronic kidney disease, or unspecified chronic kidney disease: Secondary | ICD-10-CM

## 2018-04-07 DIAGNOSIS — R3914 Feeling of incomplete bladder emptying: Secondary | ICD-10-CM

## 2018-04-07 DIAGNOSIS — M79604 Pain in right leg: Secondary | ICD-10-CM

## 2018-04-07 DIAGNOSIS — M48061 Spinal stenosis, lumbar region without neurogenic claudication: Secondary | ICD-10-CM

## 2018-04-07 DIAGNOSIS — K5900 Constipation, unspecified: Secondary | ICD-10-CM | POA: Insufficient documentation

## 2018-04-07 DIAGNOSIS — I2510 Atherosclerotic heart disease of native coronary artery without angina pectoris: Secondary | ICD-10-CM

## 2018-04-07 DIAGNOSIS — Z1211 Encounter for screening for malignant neoplasm of colon: Secondary | ICD-10-CM | POA: Insufficient documentation

## 2018-04-07 DIAGNOSIS — R3916 Straining to void: Secondary | ICD-10-CM | POA: Diagnosis not present

## 2018-04-07 DIAGNOSIS — I5020 Unspecified systolic (congestive) heart failure: Secondary | ICD-10-CM

## 2018-04-07 DIAGNOSIS — R39198 Other difficulties with micturition: Secondary | ICD-10-CM | POA: Insufficient documentation

## 2018-04-07 DIAGNOSIS — M79605 Pain in left leg: Secondary | ICD-10-CM

## 2018-04-07 DIAGNOSIS — Z79899 Other long term (current) drug therapy: Secondary | ICD-10-CM | POA: Diagnosis not present

## 2018-04-07 DIAGNOSIS — F1910 Other psychoactive substance abuse, uncomplicated: Secondary | ICD-10-CM

## 2018-04-07 DIAGNOSIS — M48062 Spinal stenosis, lumbar region with neurogenic claudication: Secondary | ICD-10-CM

## 2018-04-07 DIAGNOSIS — Z720 Tobacco use: Secondary | ICD-10-CM

## 2018-04-07 DIAGNOSIS — N183 Chronic kidney disease, stage 3 (moderate): Secondary | ICD-10-CM

## 2018-04-07 DIAGNOSIS — N401 Enlarged prostate with lower urinary tract symptoms: Secondary | ICD-10-CM

## 2018-04-07 DIAGNOSIS — R3911 Hesitancy of micturition: Secondary | ICD-10-CM

## 2018-04-07 MED ORDER — POLYETHYLENE GLYCOL 3350 17 G PO PACK: 17.0000 g | PACK | Freq: Every day | ORAL | 0 refills | Status: DC | PRN

## 2018-04-07 MED ORDER — GABAPENTIN 300 MG PO CAPS: 600.0000 mg | ORAL_CAPSULE | Freq: Two times a day (BID) | ORAL | 2 refills | Status: DC

## 2018-04-07 NOTE — Progress Notes (Signed)
   CC: lumbar spinal stenosis follow-up.  HPI:   [name] is a 62 y.o. male with a past medical history of HTN, HFrEF, CAD, and CKD III who presents for follow up of HTN and bilateral lower extremity pain. Urinary difficulties, constipation, substance use, tobacco use, and healthcare maintenance were also discussed today. Please see problem based charting for the status of the patient's current and chronic medical conditions.   Past Medical History:  Diagnosis Date  . Arthritis    "feels like it in my legs" (09/20/2014)  . Borderline type 2 diabetes mellitus   . Hypertension     Review of Systems:   Pertinent positives mentioned in HPI. Remainder of all ROS negative.   Physical Exam: Vitals:   04/07/18 1332  BP: 116/73  Pulse: 82  Temp: 98 F (36.7 C)  TempSrc: Oral  SpO2: 98%  Weight: 207 lb 14.4 oz (94.3 kg)  Height: 6' (1.829 m)   Physical Exam  Constitutional: Well-developed, well-nourished, and in no distress.  Eyes: Pupils are equal, round, and reactive to light. EOM are normal.  Cardiovascular: Normal rate and regular rhythm. No murmurs, rubs, or gallops. Pulmonary/Chest: Effort normal. Clear to auscultation bilaterally. No wheezes, rales, or rhonchi. Abdominal: Bowel sounds present. Soft, non-distended, non-tender. Ext: No lower extremity edema. Skin: Warm and dry.   Assessment & Plan:   See Encounters Tab for problem based charting.  Patient seen with Dr. Lynnae January

## 2018-04-07 NOTE — Assessment & Plan Note (Signed)
Patient has continued to cut down and now smokes 2 cigarettes every other day. He is happy with his progress and does not want pharmaceutical intervention at this point.   Plan 1. Continue to strive toward complete smoking cessation 2. Reassess progress at next visit

## 2018-04-07 NOTE — Assessment & Plan Note (Signed)
HPI: Patient reports difficulty initiating urine stream, starting and stopping of urine stream, and incomplete emptying of his bladder for the past 6 months. He reports that he needs to use the restroom every 15 minutes because he never fully voids. He has no personal or family history of BPH or prostate cancer. He denies urinary incontinence.   Assessment: Mr. Culverhouse's symptoms are likely due to BPH. Less concern for prostate cancer due to patient's age, negative ROS, and lack of family history. Will not pursue PSA today, but may reconsider in the future if symptoms progress. Once patient is stable on his increased Gabapentin dose, would like to start Flomax.   Plan 1. Reassess in 2-3 weeks via phone/messaging system. Consider starting Flomax at that time if patient has tolerated gabapentin increase.

## 2018-04-07 NOTE — Assessment & Plan Note (Signed)
HPI: Mr. Snedden continues to have posterior bilateral leg cramping and burning that radiates down into his feet. This problem has been present and stable for years. The pain is worse with standing up straight and walking. The pain is somewhat relieved by leaning forward. The patient takes 300 mg gabapentin twice a day, which he reports provides no relief. He also takes flexeril 5 mg TID PRN. Lumbar MRI 07/2017 showed spinal stenosis, most severe at L4-L5. Referrals to both PT and neurosurgery were made in 08/2017, but the patient has not been to either of these appointments due to transportation issues.   Assessment: Mr. Fenter neurogenic claudication symptoms are active, but stable. Because his current gabapentin dose is not helping the pain, will increase the dose today. However, I suspect due to the degree of stenosis noted on lumbar spine that medical management will not successfully eradicate his pain. He warrants both PT and neurosurgical evaluation.   Plan 1. Increase gabapentin from 300 mg BID to 600 mg BID. 2. Continue flexeril 5 mg TID PRN 3. Renewed referrals to PT and neurosurgery 4. Completed [facility] paperwork today to resolve transportation issues preventing him from making his referral appointments.

## 2018-04-07 NOTE — Assessment & Plan Note (Addendum)
Blood pressure today is 116/73. Well-controlled on Bidil, Entresto, and Eplerenone. Patient follows closely with cardiology as well.   Plan: Continue current regimen.

## 2018-04-07 NOTE — Assessment & Plan Note (Signed)
Patient reports that he no longer uses cocaine and drinks only one beer per week. Encouraged him to continue abstinence from illicit drugs and infrequent alcohol use.

## 2018-04-07 NOTE — Assessment & Plan Note (Signed)
HPI: [name] reports constipation for the past 6 months. He has a bowel movement once every day or every other day, but his stool is harder than it used to be and he has to strain to stool. He denies any changes in his diet, but reports decreased activity due to his ongoing leg pain. He denies hematochezia or melena.   Assessment: [name] constipation symptoms are very mild. He had a normal calcium level 5 months ago and normal TSH 1 year ago. His symptoms are likely due to poor fiber intake and decreased activity.   Plan 1. Start fiber supplement of choice 2. Start miralax 17g QD PRN

## 2018-04-07 NOTE — Patient Instructions (Addendum)
It was a pleasure meeting you today, Edward Mullen.   1. For your spinal stenosis and leg pain - You've been referred to both physical therapy and neurosurgery. We filled out [facility] paperwork today, so hopefully you will be able to schedule these appointments soon. - Increase your gabapentin from 300 mg twice a day to 600 mg twice a day.   2. For your urinary issues - Call or message the clinic in 2-3 weeks and report to [place] how you're feeling on the increased dose of gabapentin. If you are feeling well, we can add on Flomax at that time.   3. For your constipation - Start a fiber supplement - Take 1 capful of Miralax as needed daily.  4. Colon cancer screening - A referral was placed to a gastroenterologist for colonoscopy.   Please return to follow up in 3 months.

## 2018-04-07 NOTE — Assessment & Plan Note (Signed)
Edward Mullen reports that his last colonoscopy was over 10 years ago. Placed referral to GI for colonoscopy today for colon cancer screening.

## 2018-04-08 ENCOUNTER — Encounter: Payer: Self-pay | Admitting: Gastroenterology

## 2018-04-10 NOTE — Progress Notes (Signed)
Internal Medicine Clinic Attending  I saw and evaluated the patient.  I personally confirmed the key portions of the history and exam documented by Dr. Dorrell and I reviewed pertinent patient test results.  The assessment, diagnosis, and plan were formulated together and I agree with the documentation in the resident's note. 

## 2018-04-14 ENCOUNTER — Encounter: Payer: Self-pay | Admitting: *Deleted

## 2018-04-23 ENCOUNTER — Other Ambulatory Visit: Payer: Self-pay | Admitting: *Deleted

## 2018-04-23 MED ORDER — GABAPENTIN 300 MG PO CAPS: 600.0000 mg | ORAL_CAPSULE | Freq: Two times a day (BID) | ORAL | 3 refills | Status: DC

## 2018-04-30 ENCOUNTER — Ambulatory Visit: Payer: Medicaid Other

## 2018-05-11 ENCOUNTER — Telehealth: Payer: Self-pay | Admitting: *Deleted

## 2018-05-11 ENCOUNTER — Other Ambulatory Visit: Payer: Self-pay

## 2018-05-11 ENCOUNTER — Ambulatory Visit: Payer: Medicaid Other | Attending: Internal Medicine

## 2018-05-11 DIAGNOSIS — M545 Low back pain: Secondary | ICD-10-CM | POA: Diagnosis present

## 2018-05-11 DIAGNOSIS — R293 Abnormal posture: Secondary | ICD-10-CM | POA: Insufficient documentation

## 2018-05-11 DIAGNOSIS — M62830 Muscle spasm of back: Secondary | ICD-10-CM | POA: Insufficient documentation

## 2018-05-11 DIAGNOSIS — G8929 Other chronic pain: Secondary | ICD-10-CM | POA: Insufficient documentation

## 2018-05-11 NOTE — Telephone Encounter (Signed)
Will proceed as scheduled with Edward Mullen's Clearance Edward Mullen PV

## 2018-05-11 NOTE — Telephone Encounter (Signed)
John,   Can you please review this gentleman's cardiac history.  I think he is ok for the [place]- his last Echo 04-07-17 EF was 50% - the one 1-18 EF was 15-20% - he had an order placed for a repeat Echo  8-19 that has not been done.  Does he need this before his colon 10-21-  He last saw cardio 10-28-17.. I just would like you to review.  Thanks, Lelan Pons

## 2018-05-11 NOTE — Therapy (Signed)
[place], [place], [address] Phone: [phone]   Fax:  [phone]  Physical Therapy Evaluation  Patient Details  Name: Edward Mullen MRN: [id number] Date of Birth: May 16, 1956 Referring Provider (PT): Vilma Prader, MD   Encounter Date: 05/11/2018  PT End of Session - 05/11/18 1418    Visit Number  1    Number of Visits  12    Date for PT Re-Evaluation  06/19/18    Authorization Type  MCD    PT Start Time  0218    PT Stop Time  0255    PT Time Calculation (min)  37 min    Activity Tolerance  Patient tolerated treatment well;Patient limited by pain    Behavior During Therapy  [facility] for tasks assessed/performed       Past Medical History:  Diagnosis Date  . Arthritis    "feels like it in my legs" (09/20/2014)  . Borderline type 2 diabetes mellitus   . Hypertension     Past Surgical History:  Procedure Laterality Date  . CARDIAC CATHETERIZATION N/A 08/27/2016   Procedure: Right/Left Heart Cath and Coronary Angiography;  Surgeon: Larey Dresser, MD;  Location: [facility];  Service: Cardiovascular;  Laterality: N/A;  . CYSTECTOMY Right    "back of my shoulder"  . TONSILLECTOMY      There were no vitals filed for this visit.   Subjective Assessment - 05/11/18 1423    Subjective  He reports pain in hips from spinal pinching on merves with numbness and tingle into feet.    Shots in lower back helped a little.  in back but not legs and hips.      Limitations  Walking;Standing    How long can you sit comfortably?  Pain does not change with sitting.     How long can you stand comfortably?  10 min    How long can you walk comfortably?  1/2 block.   Stand and rest can get going again.    Uses cane since 2017    Diagnostic tests  stenosis, lumbar    Patient Stated Goals  To walk better     Currently in Pain?  Yes    Pain Score  8     Pain Location  Hip    Pain Orientation   Right;Left;Posterior   thighs and calfs   Pain Descriptors / Indicators  Tingling   needle   Pain Type  Chronic pain    Pain Onset  More than a month ago    Pain Frequency  Constant    Aggravating Factors   Nothing     Pain Relieving Factors  nothing, meds do'nt help         [facility] PT Assessment - 05/11/18 0001      Assessment   Medical Diagnosis  spinal stenosis    Referring Provider (PT)  Vilma Prader, MD    Onset Date/Surgical Date  --   2012   Hand Dominance  --   06/2018 PCP   Prior Therapy  No      Precautions   Precautions  None      Restrictions   Weight Bearing Restrictions  No      Balance Screen   Has the patient fallen in the past 6 months  No      Prior Function   Level of Independence  Independent    Vocation  Unemployed  Cognition   Overall Cognitive Status  Within Functional Limits for tasks assessed      Posture/Postural Control   Posture Comments  flexed posture with no lumbar lordosis.       ROM / Strength   AROM / PROM / Strength  AROM;Strength;PROM      AROM   AROM Assessment Site  Lumbar    Lumbar Flexion  90    Lumbar Extension  0    Lumbar - Right Side Bend  10    Lumbar - Left Side Bend  10      Strength   Overall Strength Comments  Fair abs and LE WNL except hip ext and abduciton 4/5 with Lt hip pain on testing       Flexibility   Soft Tissue Assessment /Muscle Length  yes    Hamstrings  60 degrees bilateral and + Hip flexor tightness  bilaterally    ITB  + bilaterally      Palpation   Palpation comment  tender LT gluteals.       Ambulation/Gait   Gait Comments  Walks with SPC flexed in trunk.                 Objective measurements completed on examination: See above findings.              PT Education - 05/11/18 1428    Education Details  POC, HEP    Person(s) Educated  Patient    Methods  Explanation;Tactile cues;Verbal cues;Handout    Comprehension  Verbalized understanding;Returned  demonstration       PT Short Term Goals - 05/11/18 1459      PT SHORT TERM GOAL #1   Title  He will be independent with initial HEP issued    Baseline  No program    Time  2    Period  Weeks    Status  New      PT SHORT TERM GOAL #2   Title  He will repot 10% decr in symptoms in leg or LT hip    Time  2    Period  Weeks    Status  New        PT Long Term Goals - 05/11/18 1500      PT LONG TERM GOAL #1   Title  He will be independnet with all hEP issued    Baseline  independent with iniital HEP    Time  6    Period  Weeks    Status  New      PT LONG TERM GOAL #2   Title  He will report decr in LT hip or leg symptoms 40-50% with normal home tasks    Baseline  8/10 at eval    Time  6    Period  Weeks    Status  New      PT LONG TERM GOAL #3   Title  He will report able to walk up to 1 block before stopping due to pain    Baseline  Max 1/2 block walking     Time  6    Period  Weeks    Status  New             Plan - 05/11/18 1454    Clinical Impression Statement  [name] presents with chronic LE and back pain more in LT hip today. His pain is constant and nothing eases or aggravates pain.   He has structural  changes with forward flexed posture   and significant tenderness LT gluteals with palpation .   He may make some gains but with chronicity of condition progress may be limited     Clinical Presentation  Stable    Clinical Decision Making  Low    Rehab Potential  Fair    PT Frequency  --   3 visits    PT Duration  --   over 2 weeks then 2x/week for 4 weeks if improving   PT Treatment/Interventions  Patient/family education;Therapeutic exercise;Moist Heat;Manual techniques;Passive range of motion;Dry needling;Taping;Traction    PT Next Visit Plan  Review HEP and add core stability and add to stretching for hip flexors. <Manual as needed.  modalities as needed.     PT Home Exercise Plan  knee to chest and cross chest and part sit up    Consulted and  Agree with Plan of Care  Patient       Patient will benefit from skilled therapeutic intervention in order to improve the following deficits and impairments:  Pain, Postural dysfunction, Increased muscle spasms, Decreased activity tolerance, Decreased range of motion, Decreased strength, Difficulty walking  Visit Diagnosis: Chronic bilateral low back pain, with sciatica presence unspecified  Muscle spasm of back  Abnormal posture     Problem List Patient Active Problem List   Diagnosis Date Noted  . Difficulty urinating 04/07/2018  . Constipation 04/07/2018  . Colon cancer screening 04/07/2018  . Anemia 12/19/2016  . CKD (chronic kidney disease) stage 3, GFR 30-59 ml/min (HCC) 12/13/2016  . Chronic systolic heart failure ([place]) 09/06/2016  . NSVT (nonsustained ventricular tachycardia) ([place]) 09/06/2016  . Non-ischemic cardiomyopathy ([place]) 08/27/2016  . 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 Mitral regurgitation 08/27/2016  . Polysubstance abuse ([place])   . Tobacco abuse 09/19/2014  . HTN (hypertension) 03/28/2011  . Bilateral lower extremity pain 03/28/2011    Darrel Hoover  PT 05/11/2018, 3:04 PM  [facility] [address] [place], [place], [address] Phone: [phone]   Fax:  [phone]  Name: VALDIS BEVILL MRN: [id number] Date of Birth: 1956-03-25

## 2018-05-11 NOTE — Telephone Encounter (Signed)
Benjamine Mola,  This pt is cleared for anesthetic care at [facility].  Best always,  Osvaldo Angst

## 2018-05-11 NOTE — Patient Instructions (Signed)
Knee to chest and across chest 3x/day 2-3 reps 15-30 sec each and part sit up x 5-10 2x/day

## 2018-05-18 ENCOUNTER — Ambulatory Visit (AMBULATORY_SURGERY_CENTER): Payer: Self-pay | Admitting: *Deleted

## 2018-05-18 VITALS — Ht 72.0 in | Wt 212.0 lb

## 2018-05-18 DIAGNOSIS — Z1211 Encounter for screening for malignant neoplasm of colon: Secondary | ICD-10-CM

## 2018-05-18 MED ORDER — NA SULFATE-K SULFATE-MG SULF 17.5-3.13-1.6 GM/177ML PO SOLN: ORAL | 0 refills | Status: DC

## 2018-05-18 MED ORDER — POLYETHYLENE GLYCOL 3350 17 GM/SCOOP PO POWD: ORAL | 0 refills | Status: DC

## 2018-05-18 MED ORDER — BISACODYL 5 MG PO TBEC: DELAYED_RELEASE_TABLET | ORAL | 0 refills | Status: DC

## 2018-05-18 NOTE — Progress Notes (Signed)
Patient denies any allergies to eggs or soy. Patient denies any problems with anesthesia/sedation. Patient denies any oxygen use at home. Patient denies taking any diet/weight loss medications or blood thinners. EMMI education offered, pt declined, has no email. Patient was given 2 day prep with Miralax/Suprep due to constipation. Instructed pt to bring a med list to colonoscopy, he states its all on his chart in epic but I want to make sure.

## 2018-05-19 ENCOUNTER — Ambulatory Visit: Payer: Medicaid Other | Attending: Internal Medicine

## 2018-05-19 DIAGNOSIS — M62830 Muscle spasm of back: Secondary | ICD-10-CM | POA: Insufficient documentation

## 2018-05-19 DIAGNOSIS — R293 Abnormal posture: Secondary | ICD-10-CM | POA: Insufficient documentation

## 2018-05-27 ENCOUNTER — Ambulatory Visit: Payer: Medicaid Other | Admitting: Physical Therapy

## 2018-05-27 ENCOUNTER — Encounter: Payer: Self-pay | Admitting: Physical Therapy

## 2018-05-27 DIAGNOSIS — M62830 Muscle spasm of back: Secondary | ICD-10-CM | POA: Diagnosis present

## 2018-05-27 DIAGNOSIS — R293 Abnormal posture: Secondary | ICD-10-CM

## 2018-05-27 NOTE — Therapy (Signed)
[place], [place], [address] Phone: [phone]   Fax:  [phone]  Physical Therapy Treatment  Patient Details  Name: Edward Mullen MRN: [id number] Date of Birth: 1956/05/17 Referring Provider (PT): Vilma Prader, MD   Encounter Date: 05/27/2018  PT End of Session - 05/27/18 1210    Visit Number  2    Number of Visits  12    Date for PT Re-Evaluation  06/19/18    Authorization Type  MCD    Authorization Time Period  05/18/2018 through 05/31/2018    Authorization - Visit Number  1    Authorization - Number of Visits  3    PT Start Time  0388    PT Stop Time  1200    PT Time Calculation (min)  38 min    Activity Tolerance  Patient tolerated treatment well    Behavior During Therapy  [facility] for tasks assessed/performed       Past Medical History:  Diagnosis Date  . Arthritis    "feels like it in my legs" (09/20/2014)  . Borderline type 2 diabetes mellitus   . CHF (congestive heart failure) ([place])   . Hypertension     Past Surgical History:  Procedure Laterality Date  . CARDIAC CATHETERIZATION N/A 08/27/2016   Procedure: Right/Left Heart Cath and Coronary Angiography;  Surgeon: Larey Dresser, MD;  Location: [facility];  Service: Cardiovascular;  Laterality: N/A;  . CYSTECTOMY Right    "back of my shoulder"  . TONSILLECTOMY      There were no vitals filed for this visit.  Subjective Assessment - 05/27/18 1123    Subjective  log rolls to get moving in bed.   has already done the exercise    Currently in Pain?  Yes    Pain Score  8     Pain Location  Hip    Pain Orientation  Right;Left;Posterior    Pain Descriptors / Indicators  Tingling;Numbness;Tightness;Pins and needles    Pain Type  Chronic pain    Pain Radiating Towards  toes tingle    Pain Frequency  Constant    Aggravating Factors   walking too much hurts ,  sitting too long,  rainey weather.     Pain Relieving Factors   cane,  sitting briefly                       [facility] Adult PT Treatment/Exercise - 05/27/18 0001      Self-Care   Self-Care  --   anatomy,  hip flexor,  stenosis,     Lumbar Exercises: Stretches   Passive Hamstring Stretch  Right;3 reps;30 seconds    Passive Hamstring Stretch Limitations  left nerve pain so flossed nerve 3 x 5 bouts with foot pump and head raise supine.      Hip Flexor Stretch  3 reps;30 seconds    Hip Flexor Stretch Limitations  needed to modify,  off mat not tolerated.   HEP             PT Education - 05/27/18 1203    Education Details  HEP,  back anatomy,  hip flexors, stenosis    Person(s) Educated  Patient    Methods  Explanation;Demonstration;Tactile cues;Verbal cues;Handout    Comprehension  Verbalized understanding;Returned demonstration       PT Short Term Goals - 05/27/18 1217      PT SHORT TERM GOAL #1  Title  He will be independent with initial HEP issued    Baseline  minor cues    Time  2    Period  Weeks    Status  On-going      PT SHORT TERM GOAL #2   Title  He will repot 10% decr in symptoms in leg or LT hip    Baseline  no change    Time  2    Period  Weeks    Status  On-going        PT Long Term Goals - 05/11/18 1500      PT LONG TERM GOAL #1   Title  He will be independnet with all hEP issued    Baseline  independent with iniital HEP    Time  6    Period  Weeks    Status  New      PT LONG TERM GOAL #2   Title  He will report decr in LT hip or leg symptoms 40-50% with normal home tasks    Baseline  8/10 at eval    Time  6    Period  Weeks    Status  New      PT LONG TERM GOAL #3   Title  He will report able to walk up to 1 block before stopping due to pain    Baseline  Max 1/2 block walking     Time  6    Period  Weeks    Status  New            Plan - 05/27/18 1212    Clinical Impression Statement  Patient has been doing the HEP.  He required minor cues for technique.   No new goals met.   He was able to progress HEP without increased pain.  Patient did not tolerate hamstring stretch  on left so tried nerve flossing   with head lift foot pump.   Patient felt less pain at end of session.  less tightness.     PT Home Exercise Plan  knee to chest and cross chest and part sit up,  hip flexor stretch    Consulted and Agree with Plan of Care  Patient       Patient will benefit from skilled therapeutic intervention in order to improve the following deficits and impairments:     Visit Diagnosis: Muscle spasm of back  Abnormal posture     Problem List Patient Active Problem List   Diagnosis Date Noted  . Difficulty urinating 04/07/2018  . Constipation 04/07/2018  . Colon cancer screening 04/07/2018  . Anemia 12/19/2016  . CKD (chronic kidney disease) stage 3, GFR 30-59 ml/min (HCC) 12/13/2016  . Chronic systolic heart failure (HCC) 09/06/2016  . NSVT (nonsustained ventricular tachycardia) (HCC) 09/06/2016  . Non-ischemic cardiomyopathy (HCC) 08/27/2016  . Non-obstructive hypertrophic cardiomyopathy (HCC) 08/27/2016  . Mitral regurgitation 08/27/2016  . Polysubstance abuse (HCC)   . Tobacco abuse 09/19/2014  . HTN (hypertension) 03/28/2011  . Bilateral lower extremity pain 03/28/2011    HARRIS,KAREN PTA 05/27/2018, 12:20 PM  [facility] [address] [place], [place], [address] Phone: [phone]   Fax:  [phone]  Name: Edward Mullen MRN: [id number] Date of Birth: 05/25/1956   

## 2018-05-27 NOTE — Patient Instructions (Signed)
Hip Flexor Stretch    Lying on back near edge of bed, bend one leg, foot flat. Hang other leg over edge, relaxed, thigh resting entirely on bed for  20 to 30 seconds.  Repeat 3____ times. Do _1-2___ sessions per day. Advanced Exercise: Bend knee back keeping thigh in contact with bed.   START:  One knee to chest with resting on bed.  May need pillows under left knee.   Avoid pain http://gt2.exer.us/346   Copyright  VHI. All rights reserved.

## 2018-06-01 ENCOUNTER — Ambulatory Visit (AMBULATORY_SURGERY_CENTER): Payer: Medicaid Other | Admitting: Gastroenterology

## 2018-06-01 ENCOUNTER — Encounter: Payer: Self-pay | Admitting: Gastroenterology

## 2018-06-01 VITALS — BP 91/58 | HR 70 | Temp 98.2°F | Resp 16 | Ht 72.0 in | Wt 212.0 lb

## 2018-06-01 DIAGNOSIS — D124 Benign neoplasm of descending colon: Secondary | ICD-10-CM | POA: Diagnosis not present

## 2018-06-01 DIAGNOSIS — D123 Benign neoplasm of transverse colon: Secondary | ICD-10-CM | POA: Diagnosis not present

## 2018-06-01 DIAGNOSIS — Z1211 Encounter for screening for malignant neoplasm of colon: Secondary | ICD-10-CM

## 2018-06-01 DIAGNOSIS — D122 Benign neoplasm of ascending colon: Secondary | ICD-10-CM | POA: Diagnosis not present

## 2018-06-01 MED ORDER — SODIUM CHLORIDE 0.9 % IV SOLN: 500.0000 mL | Freq: Once | INTRAVENOUS | Status: DC

## 2018-06-01 NOTE — Op Note (Signed)
[facility] Patient Name: Edward Mullen Procedure Date: 06/01/2018 8:04 AM MRN: [id number] Endoscopist: Thornton Park MD, MD Age: 62 Referring MD:  Date of Birth: [birth year] Gender: Male Account #: [id number] Procedure:                Colonoscopy Indications:              Screening for colorectal malignant neoplasm. There                            is no known family history of colon cancer or                            polyps. No baseline GI symptoms. Medicines:                See the Anesthesia note for documentation of the                            administered medications Procedure:                Pre-Anesthesia Assessment:                           - Prior to the procedure, a History and Physical                            was performed, and patient medications and                            allergies were reviewed. The patient's tolerance of                            previous anesthesia was also reviewed. The risks                            and benefits of the procedure and the sedation                            options and risks were discussed with the patient.                            All questions were answered, and informed consent                            was obtained. Prior Anticoagulants: The patient has                            taken no previous anticoagulant or antiplatelet                            agents. ASA Grade Assessment: III - A patient with                            severe systemic disease. After reviewing the risks  and benefits, the patient was deemed in                            satisfactory condition to undergo the procedure.                           After obtaining informed consent, the colonoscope                            was passed under direct vision. Throughout the                            procedure, the patient's blood pressure, pulse, and                            oxygen saturations were  monitored continuously. The                            Colonoscope was introduced through the anus and                            advanced to the the cecum, identified by                            appendiceal orifice and ileocecal valve. The                            colonoscopy was performed without difficulty. The                            patient tolerated the procedure well. The quality                            of the bowel preparation was good. Scope In: 8:10:54 AM Scope Out: 8:29:42 AM Scope Withdrawal Time: 0 hours 14 minutes 49 seconds  Total Procedure Duration: 0 hours 18 minutes 48 seconds  Findings:                 The perianal and digital rectal examinations were                            normal.                           Multiple small and large-mouthed diverticula were                            found in the sigmoid colon, descending colon and                            ascending colon.                           Two sessile polyps were found in the ascending  colon. The polyps were 1 to 2 mm in size. These                            polyps were removed with a cold biopsy forceps.                            Resection and retrieval were complete.                           A 5 mm polyp was found in the transverse colon. The                            polyp was sessile. The polyp was removed with a                            cold snare. Resection and retrieval were complete.                           A 10 mm polyp was found in the descending colon.                            The polyp was pedunculated. The polyp was removed                            with a hot snare. Resection and retrieval were                            complete.                           The exam was otherwise without abnormality on                            direct and retroflexion views. Complications:            No immediate complications. Estimated Blood Loss:      Estimated blood loss: none. Impression:               - Diverticulosis in the sigmoid colon, in the                            descending colon and in the ascending colon.                           - Two 1 to 2 mm polyps in the ascending colon,                            removed with a cold biopsy forceps. Resected and                            retrieved.                           - One 5 mm polyp in the  transverse colon, removed                            with a cold snare. Resected and retrieved.                           - One 10 mm polyp in the descending colon, removed                            with a hot snare. Resected and retrieved.                           - The examination was otherwise normal on direct                            and retroflexion views. Recommendation:           - Discharge patient to home.                           - Resume previous diet today. Follow high-fiber                            diet.                           - Continue present medications.                           - Await pathology results.                           - Repeat colonoscopy in 3 years for surveillance if                            at least 3 polyps are adenomatous. I will send you                            a letter when the results are available with                            specific recommendations for when the colonoscopy                            should be repeated. Thornton Park MD, MD 06/01/2018 8:36:56 AM This report has been signed electronically.

## 2018-06-01 NOTE — Progress Notes (Signed)
Pt's states no medical or surgical changes since previsit or office visit. 

## 2018-06-01 NOTE — Progress Notes (Signed)
Report to PACU, RN, vss, BBS= Clear.  

## 2018-06-01 NOTE — Progress Notes (Signed)
Called to room to assist during endoscopic procedure.  Patient ID and intended procedure confirmed with present staff. Received instructions for my participation in the procedure from the performing physician.  

## 2018-06-01 NOTE — Patient Instructions (Signed)
YOU HAD AN ENDOSCOPIC PROCEDURE TODAY AT [facility]:   Refer to the procedure report that was given to you for any specific questions about what was found during the examination.  If the procedure report does not answer your questions, please call your gastroenterologist to clarify.  If you requested that your care partner not be given the details of your procedure findings, then the procedure report has been included in a sealed envelope for you to review at your convenience later.  YOU SHOULD EXPECT: Some feelings of bloating in the abdomen. Passage of more gas than usual.  Walking can help get rid of the air that was put into your GI tract during the procedure and reduce the bloating. If you had a lower endoscopy (such as a colonoscopy or flexible sigmoidoscopy) you may notice spotting of blood in your stool or on the toilet paper. If you underwent a bowel prep for your procedure, you may not have a normal bowel movement for a few days.  Please Note:  You might notice some irritation and congestion in your nose or some drainage.  This is from the oxygen used during your procedure.  There is no need for concern and it should clear up in a day or so.  SYMPTOMS TO REPORT IMMEDIATELY:   Following lower endoscopy (colonoscopy or flexible sigmoidoscopy):  Excessive amounts of blood in the stool  Significant tenderness or worsening of abdominal pains  Swelling of the abdomen that is new, acute  Fever of 100F or higher   For urgent or emergent issues, a gastroenterologist can be reached at any hour by calling [phone].   DIET:  We do recommend a small meal at first, but then you may proceed to your regular diet.  Follow a High Fiber Diet (see handout given to you by your recovery nurse). Drink plenty of fluids but you should avoid alcoholic beverages for 24 hours.  MEDICATIONS: Continue present medications.   Please see handouts given to you by your recovery  nurse.  ACTIVITY:  You should plan to take it easy for the rest of today and you should NOT DRIVE or use heavy machinery until tomorrow (because of the sedation medicines used during the test).    FOLLOW UP: Our staff will call the number listed on your records the next business day following your procedure to check on you and address any questions or concerns that you may have regarding the information given to you following your procedure. If we do not reach you, we will leave a message.  However, if you are feeling well and you are not experiencing any problems, there is no need to return our call.  We will assume that you have returned to your regular daily activities without incident.  If any biopsies were taken you will be contacted by phone or by letter within the next 1-3 weeks.  Please call us at [phone] if you have not heard about the biopsies in 3 weeks.   Thank you for allowing [place] to provide for your healthcare needs today.  SIGNATURES/CONFIDENTIALITY: You and/or your care partner have signed paperwork which will be entered into your electronic medical record.  These signatures attest to the fact that that the information above on your After Visit Summary has been reviewed and is understood.  Full responsibility of the confidentiality of this discharge information lies with you and/or your care-partner.

## 2018-06-02 ENCOUNTER — Telehealth: Payer: Self-pay | Admitting: *Deleted

## 2018-06-02 ENCOUNTER — Ambulatory Visit: Payer: Medicaid Other

## 2018-06-02 DIAGNOSIS — R293 Abnormal posture: Secondary | ICD-10-CM

## 2018-06-02 DIAGNOSIS — M62830 Muscle spasm of back: Secondary | ICD-10-CM

## 2018-06-02 IMAGING — CR DG CHEST 1V PORT
1 series · 1 of 1 positions shown · non-contrast
Comparison: 08/23/2016

CLINICAL DATA: Syncope at home tonight.  Hypotension.

EXAM:
PORTABLE CHEST 1 VIEW

[AP]
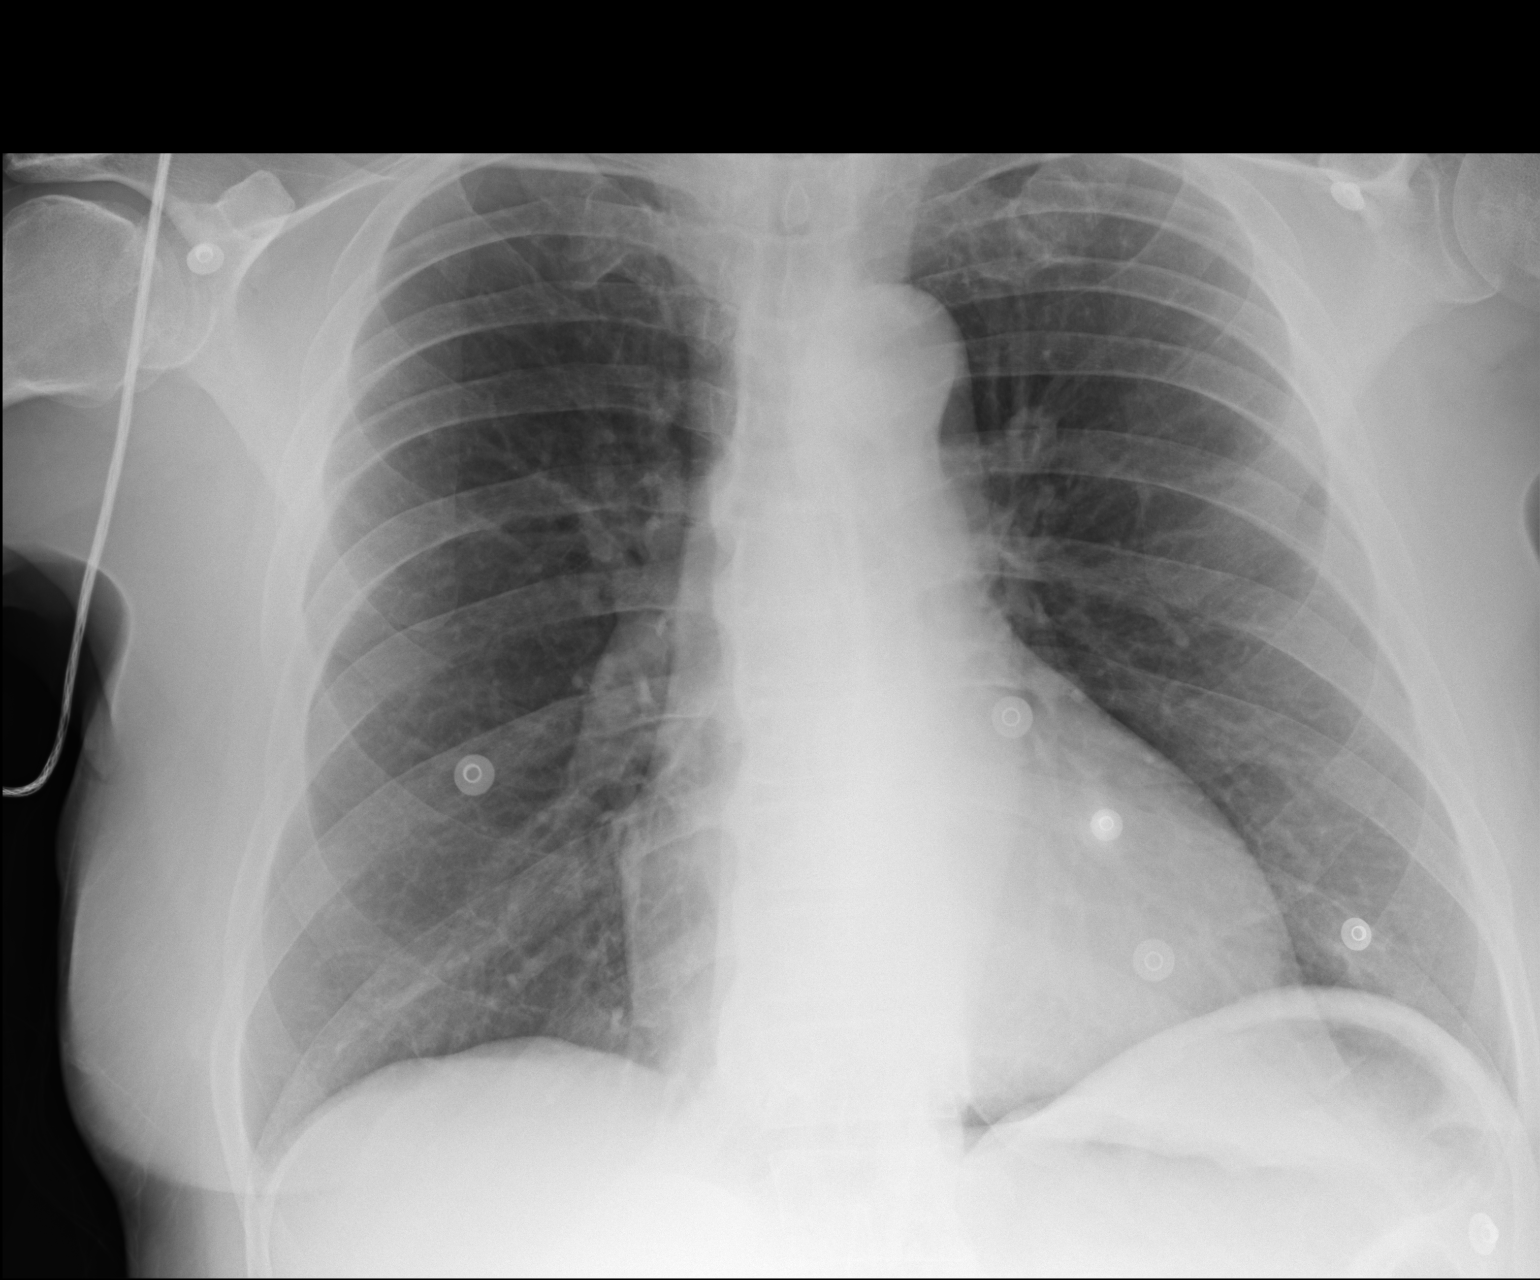

[1 of 1 positions shown; findings below may reference images not displayed]

FINDINGS: A single AP portable view of the chest demonstrates no focal
airspace consolidation or alveolar edema. The lungs are grossly
clear. There is no large effusion or pneumothorax. There is
unchanged cardiomegaly. Cardiac and mediastinal contours are
otherwise unremarkable.
IMPRESSION: Unchanged cardiomegaly. No consolidation or effusion. Normal
vasculature.

## 2018-06-02 NOTE — Telephone Encounter (Signed)
  Follow up Call-  Call back number 06/01/2018  Post procedure Call Back phone  # [phone]  Permission to leave phone message Yes  Some recent data might be hidden     Patient questions:  Do you have a fever, pain , or abdominal swelling? No. Pain Score  0 *  Have you tolerated food without any problems? Yes.    Have you been able to return to your normal activities? Yes.    Do you have any questions about your discharge instructions: Diet   No. Medications  No. Follow up visit  No.  Do you have questions or concerns about your Care? No.  Actions: * If pain score is 4 or above: No action needed, pain <4.

## 2018-06-02 NOTE — Therapy (Addendum)
[place], [place], [address] Phone: [phone]   Fax:  [phone]  Physical Therapy Treatment/Discharge  Patient Details  Name: Edward Mullen MRN: [id number] Date of Birth: 1956/03/20 Referring Provider (PT): Vilma Prader, MD   Encounter Date: 06/02/2018  PT End of Session - 06/02/18 0937    Visit Number  3    Number of Visits  12    Date for PT Re-Evaluation  07/03/18    Authorization Type  MCD    PT Start Time  0930    PT Stop Time  1010    PT Time Calculation (min)  40 min    Activity Tolerance  Patient tolerated treatment well;Patient limited by pain    Behavior During Therapy  [facility] for tasks assessed/performed       Past Medical History:  Diagnosis Date  . Arthritis    "feels like it in my legs" (09/20/2014)  . Borderline type 2 diabetes mellitus   . CHF (congestive heart failure) ([place])   . Hypertension     Past Surgical History:  Procedure Laterality Date  . CARDIAC CATHETERIZATION N/A 08/27/2016   Procedure: Right/Left Heart Cath and Coronary Angiography;  Surgeon: Larey Dresser, MD;  Location: [facility];  Service: Cardiovascular;  Laterality: N/A;  . CYSTECTOMY Right    "back of my shoulder"  . TONSILLECTOMY      There were no vitals filed for this visit.  Subjective Assessment - 06/02/18 0933    Subjective  No improvements since eval.   He was not sure why he missed one of the appointments and this visit was scheduled past end of auth.     Pain Score  6     Pain Location  Back    Pain Orientation  Right;Left;Posterior;Lower    Pain Descriptors / Indicators  Tightness;Numbness;Pins and needles    Pain Type  Chronic pain    Pain Radiating Towards  toes tingle at times    Pain Onset  More than a month ago    Pain Frequency  Constant    Aggravating Factors   walking, sit too long, rain    Pain Relieving Factors  cane , sit          [facility] PT Assessment -  06/02/18 0001      Assessment   Medical Diagnosis  spinal stenosis    Referring Provider (PT)  Vilma Prader, MD      AROM   Lumbar Flexion  90    Lumbar Extension  0    Lumbar - Right Side Bend  10    Lumbar - Left Side Bend  10      Strength   Overall Strength Comments  Fair abs and LE WNL except hip ext and abduciton 4/5 with Lt hip pain on testing       Flexibility   Hamstrings  60 degrees bilateral and + Hip flexor tightness  bilaterally    ITB  + bilaterally                   [facility] Adult PT Treatment/Exercise - 06/02/18 0001      Exercises   Exercises  Lumbar      Lumbar Exercises: Seated   Sit to Stand  20 reps    Sit to Stand Limitations  2x10      Lumbar Exercises: Supine   Ab Set  10 reps    Clam  20  reps    Clam Limitations  with ab set      Lumbar Exercises: Quadruped   Straight Leg Raise  10 reps    Straight Leg Raises Limitations  RT/LT       Manual Therapy   Manual Therapy  Joint mobilization;Passive ROM;Manual Traction             PT Education - 06/02/18 1006    Education Details  HEP    Person(s) Educated  Patient    Methods  Explanation;Demonstration;Tactile cues;Verbal cues;Handout    Comprehension  Returned demonstration;Verbalized understanding       PT Short Term Goals - 06/02/18 0941      PT SHORT TERM GOAL #1   Title  He will be independent with initial HEP issued    Status  Achieved      PT SHORT TERM GOAL #2   Title  He will report 10% decr in symptoms in leg or LT hip    Baseline  no change    Status  On-going        PT Long Term Goals - 06/02/18 1005      PT LONG TERM GOAL #1   Title  He will be independent with all hEP issued    Baseline  independent with initial hEP    Status  On-going      PT LONG TERM GOAL #2   Title  He will report decr in LT hip or leg symptoms 40-50% with normal home tasks    Baseline  6-8/10    Status  On-going      PT LONG TERM GOAL #3   Title  He will report able to  walk up to 1 block before stopping due to pain    Baseline  Max 1/2 block walking     Status  On-going            Plan - 06/02/18 0937    Clinical Impression Statement  He was able to demo HEP and appears to be doing this at home . Added to HEP today.     He was not able to get in initial Authorized visits but it appears he will be compliant with program.  I think he may make progress with PT    PT Frequency  2x / week    PT Duration  4 weeks    PT Treatment/Interventions  Patient/family education;Therapeutic exercise;Moist Heat;Manual techniques;Passive range of motion;Dry needling;Taping;Traction    PT Next Visit Plan  Review HEP and add core stability and add to stretching for hip flexors. <Manual as needed.  modalities as needed.     PT Home Exercise Plan  knee to chest and cross chest and part sit up,  hip flexor stretch, supine and side clcams, quadraped hip ext, sit to stand    Consulted and Agree with Plan of Care  Patient       Patient will benefit from skilled therapeutic intervention in order to improve the following deficits and impairments:  Pain, Postural dysfunction, Increased muscle spasms, Decreased activity tolerance, Decreased range of motion, Decreased strength, Difficulty walking  Visit Diagnosis: Muscle spasm of back  Abnormal posture     Problem List Patient Active Problem List   Diagnosis Date Noted  . Difficulty urinating 04/07/2018  . Constipation 04/07/2018  . Colon cancer screening 04/07/2018  . Anemia 12/19/2016  . CKD (chronic kidney disease) stage 3, GFR 30-59 ml/min (HCC) 12/13/2016  . Chronic systolic heart failure ([place]) 09/06/2016  .  NSVT (nonsustained ventricular tachycardia) ([place]) 09/06/2016  . Non-ischemic cardiomyopathy ([place]) 08/27/2016  . 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 Mitral regurgitation 08/27/2016  . Polysubstance abuse ([place])   . Tobacco abuse 09/19/2014  . HTN (hypertension) 03/28/2011  .  Bilateral lower extremity pain 03/28/2011    Darrel Hoover  PT 06/02/2018, 10:12 AM  [facility] [address] [place], [place], [address] Phone: [phone]   Fax:  [phone]  Name: Edward Mullen MRN: [id number] Date of Birth: November 28, 1955  PHYSICAL THERAPY DISCHARGE SUMMARY  Visits from Start of Care: 3  Current functional level related to goals / functional outcomes: Unknown . He was contacted about missed appointment and he said he had our number to reschedule but he did not   Remaining deficits: unknown   Education / Equipment: HEP Plan:                                                    Patient goals were not met. Patient is being discharged due to not returning since the last visit.  ?????  Pearson Forster PT     07/23/18

## 2018-06-02 NOTE — Patient Instructions (Addendum)
Hip External Rotation: Transverse Plane Stability    One knee bent, one leg straight. Slowly roll bent knee out. Be sure pelvis does not rotate. Do ___ times. Restabilize pelvis. Repeat with other leg. Do ___ sets, ___ times per day.  http://ss.exer.us/22   Copyright  VHI. All rights reserved.  HIP: Abduction / External Rotation - Side-Lying    Lie on side with hip and knees bent. Raise top knee up, squeezing glutes. Keep ankles together. ___ reps per set, ___ sets per day, ___ days per week   Copyright  VHI. All rights reserved.  TRUNK: Partial Sit-Up    Tighten abdominal muscles, raise head and shoulders off surface. Keep eyes on ceiling to avoid neck strain. Hold ___ seconds. ___ reps per set, ___ sets per day, ___ days per week   Copyright  VHI. All rights reserved.  Sit to Stand: Easy    Get ON TARGET. Long roller in front for support, erect posture, feet on floor. Leaning at hips, shift weight forward. Push on roll to stand. Hold ___ seconds. Repeat ___ times. Do ___ sessions per day.  Copyright  VHI. All rights reserved.  3x10 reps   Every other day   Hold 2-3 sec                       Also quadra ped hip ext

## 2018-06-04 ENCOUNTER — Encounter: Payer: Self-pay | Admitting: Gastroenterology

## 2018-06-15 ENCOUNTER — Ambulatory Visit: Payer: Medicaid Other | Attending: Internal Medicine

## 2018-06-18 ENCOUNTER — Ambulatory Visit: Payer: Medicaid Other | Admitting: Physical Therapy

## 2018-06-24 ENCOUNTER — Ambulatory Visit: Payer: Medicaid Other

## 2018-06-24 ENCOUNTER — Telehealth: Payer: Self-pay | Admitting: Physical Therapy

## 2018-06-24 NOTE — Telephone Encounter (Signed)
Spoke with Mr Gorrell and he reported he thought his visit was on the 15th of this month. I asked him to call to make an appointment before 07/12/18. He stated he had our number.

## 2018-07-21 ENCOUNTER — Other Ambulatory Visit (HOSPITAL_COMMUNITY): Payer: Self-pay | Admitting: Internal Medicine

## 2018-07-21 ENCOUNTER — Other Ambulatory Visit (HOSPITAL_COMMUNITY): Payer: Self-pay | Admitting: Cardiology

## 2018-08-25 ENCOUNTER — Other Ambulatory Visit (HOSPITAL_COMMUNITY): Payer: Self-pay | Admitting: Cardiology

## 2018-08-25 ENCOUNTER — Other Ambulatory Visit: Payer: Self-pay | Admitting: Internal Medicine

## 2018-08-25 ENCOUNTER — Other Ambulatory Visit (HOSPITAL_COMMUNITY): Payer: Self-pay | Admitting: Internal Medicine

## 2018-08-28 NOTE — Telephone Encounter (Signed)
Pt needs an appointment for future refills 

## 2018-11-03 ENCOUNTER — Telehealth: Payer: Medicaid Other | Admitting: Internal Medicine

## 2018-11-03 ENCOUNTER — Other Ambulatory Visit: Payer: Self-pay

## 2018-11-03 ENCOUNTER — Telehealth: Payer: Self-pay | Admitting: Internal Medicine

## 2018-11-03 NOTE — Telephone Encounter (Signed)
I called Edward Mullen for a tele-health visit. He did not answer his mobile or home phone. Neither number had an active voice mailbox. If the patient calls back, please schedule for a virtual visit the afternoon of 3/31 for my next continuity clinic.

## 2018-11-10 ENCOUNTER — Other Ambulatory Visit (HOSPITAL_COMMUNITY): Payer: Self-pay | Admitting: Cardiology

## 2018-12-10 ENCOUNTER — Other Ambulatory Visit (HOSPITAL_COMMUNITY): Payer: Self-pay | Admitting: Internal Medicine

## 2019-01-16 ENCOUNTER — Other Ambulatory Visit (HOSPITAL_COMMUNITY): Payer: Self-pay | Admitting: Cardiology

## 2019-01-18 ENCOUNTER — Other Ambulatory Visit: Payer: Self-pay | Admitting: Internal Medicine

## 2019-01-19 ENCOUNTER — Telehealth (HOSPITAL_COMMUNITY): Payer: Self-pay | Admitting: Cardiology

## 2019-01-19 NOTE — Telephone Encounter (Signed)
entrestoPA submitted via [employer]

## 2019-01-28 NOTE — Telephone Encounter (Signed)
PA for entresto denied as supporting documents needed to continue med.  PA form completed, OV note attached. Awaiting MD signature to be faxed to [place] [employer]. Placed in MD folder

## 2019-01-31 ENCOUNTER — Encounter: Payer: Self-pay | Admitting: *Deleted

## 2019-02-01 NOTE — Telephone Encounter (Signed)
PA form signed by MD and last OV note faxed to [phone] [employer]

## 2019-02-03 NOTE — Telephone Encounter (Signed)
Prior authorization through [employer] was APPROVED for Edward Mullen and will expire on 01/27/20.

## 2019-02-23 ENCOUNTER — Other Ambulatory Visit (HOSPITAL_COMMUNITY): Payer: Self-pay | Admitting: Cardiology

## 2019-03-09 ENCOUNTER — Encounter: Payer: Self-pay | Admitting: Internal Medicine

## 2019-03-09 ENCOUNTER — Ambulatory Visit: Payer: Medicaid Other | Admitting: Internal Medicine

## 2019-03-09 ENCOUNTER — Encounter (INDEPENDENT_AMBULATORY_CARE_PROVIDER_SITE_OTHER): Payer: Self-pay

## 2019-03-09 ENCOUNTER — Other Ambulatory Visit: Payer: Self-pay

## 2019-03-09 VITALS — BP 105/76 | HR 96 | Temp 98.3°F | Ht 72.0 in | Wt 196.1 lb

## 2019-03-09 DIAGNOSIS — G8929 Other chronic pain: Secondary | ICD-10-CM | POA: Diagnosis not present

## 2019-03-09 DIAGNOSIS — R350 Frequency of micturition: Secondary | ICD-10-CM

## 2019-03-09 DIAGNOSIS — Z720 Tobacco use: Secondary | ICD-10-CM | POA: Diagnosis not present

## 2019-03-09 DIAGNOSIS — M79605 Pain in left leg: Secondary | ICD-10-CM | POA: Diagnosis not present

## 2019-03-09 DIAGNOSIS — Z79899 Other long term (current) drug therapy: Secondary | ICD-10-CM

## 2019-03-09 DIAGNOSIS — Z1211 Encounter for screening for malignant neoplasm of colon: Secondary | ICD-10-CM

## 2019-03-09 DIAGNOSIS — K089 Disorder of teeth and supporting structures, unspecified: Secondary | ICD-10-CM

## 2019-03-09 DIAGNOSIS — Z86010 Personal history of colonic polyps: Secondary | ICD-10-CM | POA: Diagnosis not present

## 2019-03-09 DIAGNOSIS — I10 Essential (primary) hypertension: Secondary | ICD-10-CM | POA: Diagnosis present

## 2019-03-09 DIAGNOSIS — M4800 Spinal stenosis, site unspecified: Secondary | ICD-10-CM

## 2019-03-09 DIAGNOSIS — I428 Other cardiomyopathies: Secondary | ICD-10-CM | POA: Diagnosis not present

## 2019-03-09 DIAGNOSIS — K5730 Diverticulosis of large intestine without perforation or abscess without bleeding: Secondary | ICD-10-CM | POA: Diagnosis not present

## 2019-03-09 DIAGNOSIS — M79604 Pain in right leg: Secondary | ICD-10-CM | POA: Diagnosis not present

## 2019-03-09 LAB — POCT GLYCOSYLATED HEMOGLOBIN (HGB A1C): Hemoglobin A1C: 4.7 % (ref 4.0–5.6)

## 2019-03-09 LAB — GLUCOSE, CAPILLARY: Glucose-Capillary: 72 mg/dL (ref 70–99)

## 2019-03-09 MED ORDER — PREGABALIN 75 MG PO CAPS: 75.0000 mg | ORAL_CAPSULE | Freq: Two times a day (BID) | ORAL | 3 refills | Status: DC

## 2019-03-09 NOTE — Patient Instructions (Signed)
[name],  It was a pleasure seeing you in clinic today.   For your Leg pain: Stop taking gabapentin at this time. Please start taking Pregablin (Lyrica) 75mg  two times a day. Please follow up with physical therapy for further strengthening exercises. A referral has been placed to neurosurgery. Please make an appointment with them at your earliest convenience.   Please follow up with your Cardiologist (Dr. Aundra Dubin) at your earliest convenience for your repeat Echo.   Referral to dentistry has been placed. Please make an appointment at your earliest convenience.    Today we will get some blood work and call you with any abnormal results.  Please contact us if you have any concerns.  Thank you!

## 2019-03-09 NOTE — Assessment & Plan Note (Signed)
Patient had colonoscopy 05/2018 revealing diverticulosis in ascending, sigmoid and descending colon. There were also sessile polyps in ascending and transverse colon that were resected and a pedunculated polyp in the descending colon that was removed. Surgical pathology report positive for tubular adenomas. Patient denies abdominal pain, diarrhea, constipation, melena or hematochezia. Patient was encouraged to follow a high fiber diet and supplement with Metamucil. Patient expressed understanding.   - Repeat colonoscopy in 3 years for colorectal screening  - Continue to monitor  - Encourage high fiber diet

## 2019-03-09 NOTE — Assessment & Plan Note (Signed)
ECHO 03/2017: EF 50%. Patient was last seen by cardiologist, Dr. Aundra Dubin, in March 2019. Patient continued on Eplerenone, Entresto, and Bidil. At the time, due to symptomatic improvement of patient's CHF, Lasix was discontinued. However, patient continues to be taking Lasix. Patient denies any chest pain, shortness of breath, or leg swelling.   - Continue current regimen

## 2019-03-09 NOTE — Assessment & Plan Note (Signed)
BP 105/76 today. Patient is taking eplerenone, entresto and bidil. He is tolerating medications well.  - Continue current regimen

## 2019-03-09 NOTE — Assessment & Plan Note (Signed)
Patient with chronic bilateral lower extremity pain for several years 2/2 spinal stenosis  presents today with continued posterior thigh pain that radiates down to his feet. The patient reports that the pain is constant and feels like a sharp/shooting pain that is worsened with walking and stretching. The patient reports that the pain is lessened with leaning forward and sitting. He reports that he when he sits, it feels like he is "sitting on a pillow". Patient currently takes gabapentin 600mg  BID but reports no relief.  Patient was previously referred to PT and neurosurgery. Patient has followed up with PT but reports minimal relief. Explained to patient that PT will help with strengthening exercises for which patient agreed. He did not follow up with previous neurosurgery recommendation but is willing to follow up now.   - Switch gabapentin 600mg  BID to pregablin 75mg  BID for better pain control - Renewed referral to neurosurgery and recommended follow up with PT

## 2019-03-09 NOTE — Assessment & Plan Note (Signed)
Patient reports smoking 2-3 cigarettes per day. He states that this is out of habit vs addiction. Patient is interested in pharmacologic interventions to quit smoking.   - Recommended nicotine gum  - Continue to encourage smoking cessation

## 2019-03-09 NOTE — Progress Notes (Signed)
   CC: follow up for HTN and BLE pain  HPI:  [name] is a 63 y.o. male with PMHx as listed below presenting to the clinic for follow up on his hypertension and bilateral lower extremity pain. Please see problem based charting for further assessment and plan regarding current and chronic issues.   Past Medical History:  Diagnosis Date  . Arthritis    "feels like it in my legs" (09/20/2014)  . Borderline type 2 diabetes mellitus   . CHF (congestive heart failure) ([place])   . Hypertension    Review of Systems:  Review of Systems  Constitutional: Negative for chills, fever and malaise/fatigue.  HENT: Negative for congestion and sore throat.   Eyes: Negative for blurred vision, double vision and discharge.  Respiratory: Negative for cough, shortness of breath and wheezing.   Cardiovascular: Negative for chest pain, palpitations, claudication and leg swelling.  Gastrointestinal: Negative for abdominal pain, blood in stool, constipation, diarrhea, melena, nausea and vomiting.  Genitourinary: Positive for frequency. Negative for dysuria, hematuria and urgency.  Musculoskeletal: Negative for back pain, falls, joint pain and myalgias.  Neurological: Positive for tingling and sensory change. Negative for dizziness, focal weakness and headaches.     Physical Exam:  Vitals:   03/09/19 0954  Weight: 196 lb 1.6 oz (89 kg)  Height: 6' (1.829 m)   Physical Exam  Constitutional: He is oriented to person, place, and time and well-developed, well-nourished, and in no distress.  HENT:  Head: Normocephalic and atraumatic.  Cardiovascular: Normal rate, regular rhythm, normal heart sounds and intact distal pulses. Exam reveals no gallop and no friction rub.  No murmur heard. Pulmonary/Chest: Effort normal and breath sounds normal. No respiratory distress. He has no wheezes. He has no rales.  Neurological: He is alert and oriented to person, place, and time. He displays normal reflexes. No  cranial nerve deficit.  Muscle strength 5/5 in bilateral upper extremities and 4/5 in bilateral lower extremities Sensation to pinprick and soft touch intact in bilateral lower extremities Achilles and Patellar reflexes 2+ bilaterally       Assessment & Plan:   See Encounters Tab for problem based charting.  Patient seen with Dr. Angelia Mould

## 2019-03-10 ENCOUNTER — Telehealth: Payer: Self-pay | Admitting: *Deleted

## 2019-03-10 ENCOUNTER — Encounter: Payer: Self-pay | Admitting: Internal Medicine

## 2019-03-10 LAB — CBC WITH DIFFERENTIAL/PLATELET
Basophils Absolute: 0.1 10*3/uL (ref 0.0–0.2)
Basos: 1 %
EOS (ABSOLUTE): 0.3 10*3/uL (ref 0.0–0.4)
Eos: 4 %
Hematocrit: 42.6 % (ref 37.5–51.0)
Hemoglobin: 14.1 g/dL (ref 13.0–17.7)
Immature Grans (Abs): 0 10*3/uL (ref 0.0–0.1)
Immature Granulocytes: 0 %
Lymphocytes Absolute: 1.3 10*3/uL (ref 0.7–3.1)
Lymphs: 20 %
MCH: 28.4 pg (ref 26.6–33.0)
MCHC: 33.1 g/dL (ref 31.5–35.7)
MCV: 86 fL (ref 79–97)
Monocytes Absolute: 1 10*3/uL — ABNORMAL HIGH (ref 0.1–0.9)
Monocytes: 16 %
Neutrophils Absolute: 3.6 10*3/uL (ref 1.4–7.0)
Neutrophils: 59 %
Platelets: 192 10*3/uL (ref 150–450)
RBC: 4.97 x10E6/uL (ref 4.14–5.80)
RDW: 13.5 % (ref 11.6–15.4)
WBC: 6.2 10*3/uL (ref 3.4–10.8)

## 2019-03-10 LAB — LIPID PANEL
Chol/HDL Ratio: 2.5 ratio (ref 0.0–5.0)
Cholesterol, Total: 108 mg/dL (ref 100–199)
HDL: 44 mg/dL (ref >39)
LDL Calculated: 55 mg/dL (ref 0–99)
Triglycerides: 44 mg/dL (ref 0–149)
VLDL Cholesterol Cal: 9 mg/dL (ref 5–40)

## 2019-03-10 LAB — CMP14 + ANION GAP
ALT: 17 IU/L (ref 0–44)
AST: 29 IU/L (ref 0–40)
Albumin/Globulin Ratio: 1.5 (ref 1.2–2.2)
Albumin: 4.2 g/dL (ref 3.8–4.8)
Alkaline Phosphatase: 92 IU/L (ref 39–117)
Anion Gap: 17 mmol/L (ref 10.0–18.0)
BUN/Creatinine Ratio: 13 (ref 10–24)
BUN: 16 mg/dL (ref 8–27)
Bilirubin Total: 1.1 mg/dL (ref 0.0–1.2)
CO2: 19 mmol/L — ABNORMAL LOW (ref 20–29)
Calcium: 9.1 mg/dL (ref 8.6–10.2)
Chloride: 105 mmol/L (ref 96–106)
Creatinine, Ser: 1.23 mg/dL (ref 0.76–1.27)
GFR calc Af Amer: 71 mL/min/{1.73_m2} (ref >59)
GFR calc non Af Amer: 62 mL/min/{1.73_m2} (ref >59)
Globulin, Total: 2.8 g/dL (ref 1.5–4.5)
Glucose: 74 mg/dL (ref 65–99)
Potassium: 4 mmol/L (ref 3.5–5.2)
Sodium: 141 mmol/L (ref 134–144)
Total Protein: 7 g/dL (ref 6.0–8.5)

## 2019-03-10 NOTE — Progress Notes (Signed)
Internal Medicine Clinic Attending  I saw and evaluated the patient.  I personally confirmed the key portions of the history and exam documented by Dr. Aslam and I reviewed pertinent patient test results.  The assessment, diagnosis, and plan were formulated together and I agree with the documentation in the resident's note.     

## 2019-03-10 NOTE — Telephone Encounter (Signed)
SPOKE WITH PATIENT AND HE IS AWARE THAT HE HAS TO MAKE HIS OWN APPOINTMENT SINCE HE HAS MEDICAID. LIST OF DENTAL PRACTICE WHO ACCEPTS MEDICAID MAILED OUT TO HIM.

## 2019-04-10 ENCOUNTER — Other Ambulatory Visit (HOSPITAL_COMMUNITY): Payer: Self-pay | Admitting: Cardiology

## 2019-04-22 ENCOUNTER — Other Ambulatory Visit: Payer: Self-pay | Admitting: Neurosurgery

## 2019-04-22 ENCOUNTER — Telehealth: Payer: Self-pay | Admitting: *Deleted

## 2019-04-22 NOTE — Telephone Encounter (Signed)
Surgery clearance request was faxed to our office. Pt is seen by Dr. Aundra Dubin in the [facility]. We will fax over clearance form to Dr. Aundra Dubin.

## 2019-04-27 NOTE — Pre-Procedure Instructions (Signed)
Edward Mullen  04/27/2019      [place], [place] - [place] [address] Phone: [phone] Fax: [phone]    Your procedure is scheduled on May 06, 2019.  Report to [facility] Entrance "A" at 530 AM.  Call this number if you have problems the morning of surgery:  [phone]   Remember:  Do not eat or drink after midnight.    Take these medicines the morning of surgery with A SIP OF WATER  Entresto Gabapentin (neurontin)  Follow your surgeon's instructions on when to hold/resume aspirin.  If no instructions were given call the office to determine how they would like to you take aspirin  7 days prior to surgery STOP taking any Aleve, Naproxen, Ibuprofen, Motrin, Advil, Goody's, BC's, all herbal medications, fish oil, and all vitamins    Day of surgery:  Do not wear jewelry  Do not wear lotions, powders, or colognes, or deodorant.  Men may shave face and neck.  Do not bring valuables to the hospital.  [facility] is not responsible for any belongings or valuables.  Contacts, dentures or bridgework may not be worn into surgery.  Leave your suitcase in the car.  After surgery it may be brought to your room.  For patients admitted to the hospital, discharge time will be determined by your treatment team.  Patients discharged the day of surgery will not be allowed to drive home.    [facility]- Preparing For Surgery  Before surgery, you can play an important role. Because skin is not sterile, your skin needs to be as free of germs as possible. You can reduce the number of germs on your skin by washing with CHG (chlorahexidine gluconate) Soap before surgery.  CHG is an antiseptic cleaner which kills germs and bonds with the skin to continue killing germs even after washing.    Oral Hygiene is also important to reduce your risk of infection.  Remember - BRUSH YOUR TEETH THE MORNING OF SURGERY  WITH YOUR REGULAR TOOTHPASTE  Please do not use if you have an allergy to CHG or antibacterial soaps. If your skin becomes reddened/irritated stop using the CHG.  Do not shave (including legs and underarms) for at least 48 hours prior to first CHG shower. It is OK to shave your face.  Please follow these instructions carefully.   1. Shower the NIGHT BEFORE SURGERY and the MORNING OF SURGERY with CHG.   2. If you chose to wash your hair, wash your hair first as usual with your normal shampoo.  3. After you shampoo, rinse your hair and body thoroughly to remove the shampoo.  4. Use CHG as you would any other liquid soap. You can apply CHG directly to the skin and wash gently with a scrungie or a clean washcloth.   5. Apply the CHG Soap to your body ONLY FROM THE NECK DOWN.  Do not use on open wounds or open sores. Avoid contact with your eyes, ears, mouth and genitals (private parts). Wash Face and genitals (private parts)  with your normal soap.  6. Wash thoroughly, paying special attention to the area where your surgery will be performed.  7. Thoroughly rinse your body with warm water from the neck down.  8. DO NOT shower/wash with your normal soap after using and rinsing off the CHG Soap.  9. Pat yourself dry with a CLEAN TOWEL.  10. Wear CLEAN PAJAMAS  to bed the night before surgery, wear comfortable clothes the morning of surgery  11. Place CLEAN SHEETS on your bed the night of your first shower and DO NOT SLEEP WITH PETS.   Day of Surgery: Shower as above Do not apply any deodorants/lotions.  Please wear clean clothes to the hospital/surgery center.   Remember to brush your teeth WITH YOUR REGULAR TOOTHPASTE.   Please read over the following fact sheets that you were given.

## 2019-04-28 ENCOUNTER — Inpatient Hospital Stay (HOSPITAL_COMMUNITY)
Admission: RE | Admit: 2019-04-28 | Discharge: 2019-04-28 | Disposition: A | Discharge status: Home / Self Care | Payer: Medicaid Other | Source: Ambulatory Visit

## 2019-04-28 ENCOUNTER — Other Ambulatory Visit (HOSPITAL_COMMUNITY): Payer: Medicaid Other

## 2019-04-30 NOTE — Progress Notes (Signed)
[place], [place] - [place] [address] Phone: [phone] Fax: [phone]      Your procedure is scheduled on Thursday, September 24th, 2020.  Report to [facility] Main Entrance "A" at 5:30 A.M., and check in at the Admitting office.   Call this number if you have problems the morning of surgery:  [phone]  Call [phone] if you have any questions prior to your surgery date Monday-Friday 8am-4pm    Remember:  Do not eat or drink after midnight the night before your surgery    Take these medicines the morning of surgery with A SIP OF WATER: Gabapentin (Neurontin) Rosuvastatin (Crestor)  7 days prior to surgery STOP taking any Aspirin (unless otherwise instructed by your surgeon), Aleve, Naproxen, Ibuprofen, Motrin, Advil, Goody's, BC's, all herbal medications, fish oil, and all vitamins.    The Morning of Surgery  Do not wear jewelry.  Do not wear lotions, powders, colognes, or deodorant  Do not shave 48 hours prior to surgery.  Men may shave face and neck.  Do not bring valuables to the hospital.  [facility] is not responsible for any belongings or valuables.  If you are a smoker, DO NOT Smoke 24 hours prior to surgery IF you wear a CPAP at night please bring your mask, tubing, and machine the morning of surgery   Remember that you must have someone to transport you home after your surgery, and remain with you for 24 hours if you are discharged the same day.   Contacts, glasses, hearing aids, dentures or bridgework may not be worn into surgery.    Leave your suitcase in the car.  After surgery it may be brought to your room.  For patients admitted to the hospital, discharge time will be determined by your treatment team.  Patients discharged the day of surgery will not be allowed to drive home.    Special instructions:   [facility]- Preparing For Surgery  Before surgery, you can  play an important role. Because skin is not sterile, your skin needs to be as free of germs as possible. You can reduce the number of germs on your skin by washing with CHG (chlorahexidine gluconate) Soap before surgery.  CHG is an antiseptic cleaner which kills germs and bonds with the skin to continue killing germs even after washing.    Oral Hygiene is also important to reduce your risk of infection.  Remember - BRUSH YOUR TEETH THE MORNING OF SURGERY WITH YOUR REGULAR TOOTHPASTE  Please do not use if you have an allergy to CHG or antibacterial soaps. If your skin becomes reddened/irritated stop using the CHG.  Do not shave (including legs and underarms) for at least 48 hours prior to first CHG shower. It is OK to shave your face.  Please follow these instructions carefully.   1. Shower the NIGHT BEFORE SURGERY and the MORNING OF SURGERY with CHG Soap.   2. If you chose to wash your hair, wash your hair first as usual with your normal shampoo.  3. After you shampoo, rinse your hair and body thoroughly to remove the shampoo.  4. Use CHG as you would any other liquid soap. You can apply CHG directly to the skin and wash gently with a scrungie or a clean washcloth.   5. Apply the CHG Soap to your body ONLY FROM THE NECK DOWN.  Do not use on open wounds or open sores. Avoid  contact with your eyes, ears, mouth and genitals (private parts). Wash Face and genitals (private parts)  with your normal soap.   6. Wash thoroughly, paying special attention to the area where your surgery will be performed.  7. Thoroughly rinse your body with warm water from the neck down.  8. DO NOT shower/wash with your normal soap after using and rinsing off the CHG Soap.  9. Pat yourself dry with a CLEAN TOWEL.  10. Wear CLEAN PAJAMAS to bed the night before surgery, wear comfortable clothes the morning of surgery  11. Place CLEAN SHEETS on your bed the night of your first shower and DO NOT SLEEP WITH  PETS.    Day of Surgery:  Do not apply any deodorants/lotions. Please shower the morning of surgery with the CHG soap  Please wear clean clothes to the hospital/surgery center.   Remember to brush your teeth WITH YOUR REGULAR TOOTHPASTE.   Please read over the following fact sheets that you were given.

## 2019-05-03 ENCOUNTER — Other Ambulatory Visit: Payer: Self-pay

## 2019-05-03 ENCOUNTER — Encounter (HOSPITAL_COMMUNITY)
Admission: RE | Admit: 2019-05-03 | Discharge: 2019-05-03 | Disposition: A | Discharge status: Home / Self Care | Payer: Medicaid Other | Source: Ambulatory Visit | Attending: Neurosurgery | Admitting: Neurosurgery

## 2019-05-03 ENCOUNTER — Other Ambulatory Visit (HOSPITAL_COMMUNITY): Payer: Medicaid Other

## 2019-05-03 ENCOUNTER — Encounter (HOSPITAL_COMMUNITY): Payer: Self-pay

## 2019-05-03 DIAGNOSIS — R9431 Abnormal electrocardiogram [ECG] [EKG]: Secondary | ICD-10-CM | POA: Insufficient documentation

## 2019-05-03 DIAGNOSIS — I454 Nonspecific intraventricular block: Secondary | ICD-10-CM | POA: Insufficient documentation

## 2019-05-03 DIAGNOSIS — I10 Essential (primary) hypertension: Secondary | ICD-10-CM | POA: Diagnosis not present

## 2019-05-03 DIAGNOSIS — Z01818 Encounter for other preprocedural examination: Secondary | ICD-10-CM | POA: Insufficient documentation

## 2019-05-03 LAB — SURGICAL PCR SCREEN
MRSA, PCR: NEGATIVE
Staphylococcus aureus: NEGATIVE

## 2019-05-03 LAB — CBC
HCT: 42.5 % (ref 39.0–52.0)
Hemoglobin: 14.1 g/dL (ref 13.0–17.0)
MCH: 29.6 pg (ref 26.0–34.0)
MCHC: 33.2 g/dL (ref 30.0–36.0)
MCV: 89.3 fL (ref 80.0–100.0)
Platelets: 186 10*3/uL (ref 150–400)
RBC: 4.76 MIL/uL (ref 4.22–5.81)
RDW: 15.1 % (ref 11.5–15.5)
WBC: 6.1 10*3/uL (ref 4.0–10.5)
nRBC: 0 % (ref 0.0–0.2)

## 2019-05-03 LAB — ABO/RH: ABO/RH(D): O POS

## 2019-05-03 LAB — BASIC METABOLIC PANEL
Anion gap: 8 (ref 5–15)
BUN: 12 mg/dL (ref 8–23)
CO2: 25 mmol/L (ref 22–32)
Calcium: 8.9 mg/dL (ref 8.9–10.3)
Chloride: 108 mmol/L (ref 98–111)
Creatinine, Ser: 1.29 mg/dL — ABNORMAL HIGH (ref 0.61–1.24)
GFR calc Af Amer: >60 mL/min (ref >60)
GFR calc non Af Amer: 58 mL/min — ABNORMAL LOW (ref >60)
Glucose, Bld: 155 mg/dL — ABNORMAL HIGH (ref 70–99)
Potassium: 3.6 mmol/L (ref 3.5–5.1)
Sodium: 141 mmol/L (ref 135–145)

## 2019-05-03 LAB — TYPE AND SCREEN
ABO/RH(D): O POS
Antibody Screen: NEGATIVE

## 2019-05-03 LAB — HEMOGLOBIN A1C
Hgb A1c MFr Bld: 5.3 % (ref 4.8–5.6)
Mean Plasma Glucose: 105.41 mg/dL

## 2019-05-03 NOTE — Progress Notes (Signed)
[place], [place] - [place] [address] Phone: [phone] Fax: [phone]      Your procedure is scheduled on  September 24  Report to [facility] Main Entrance "A" at 0530 A.M., and check in at the Admitting office.  Call this number if you have problems the morning of surgery:  [phone]  Call [phone] if you have any questions prior to your surgery date Monday-Friday 8am-4pm    Remember:  Do not eat or drink after midnight the night before your surgery    Take these medicines the morning of surgery with A SIP OF WATER  BIDIL gabapentin (NEURONTIN)  rosuvastatin (CRESTOR)   Follow your surgeon's instructions on when to stop Aspirin.  If no instructions were given by your surgeon then you will need to call the office to get those instructions.     As of today, STOP taking any Aleve, Naproxen, Ibuprofen, Motrin, Advil, Goody's, BC's, all herbal medications, fish oil, and all vitamins.    The Morning of Surgery  Do not wear jewelry  Do not wear lotions, powders, or perfumes/colognes, or deodorant  Men may shave face and neck.  Do not bring valuables to the hospital.  [facility] is not responsible for any belongings or valuables.  If you are a smoker, DO NOT Smoke 24 hours prior to surgery IF you wear a CPAP at night please bring your mask, tubing, and machine the morning of surgery   Remember that you must have someone to transport you home after your surgery, and remain with you for 24 hours if you are discharged the same day.   Contacts, glasses, hearing aids, dentures or bridgework may not be worn into surgery.    Leave your suitcase in the car.  After surgery it may be brought to your room.  For patients admitted to the hospital, discharge time will be determined by your treatment team.  Patients discharged the day of surgery will not be allowed to drive home.    Special  instructions:   [facility]- Preparing For Surgery  Before surgery, you can play an important role. Because skin is not sterile, your skin needs to be as free of germs as possible. You can reduce the number of germs on your skin by washing with CHG (chlorahexidine gluconate) Soap before surgery.  CHG is an antiseptic cleaner which kills germs and bonds with the skin to continue killing germs even after washing.    Oral Hygiene is also important to reduce your risk of infection.  Remember - BRUSH YOUR TEETH THE MORNING OF SURGERY WITH YOUR REGULAR TOOTHPASTE  Please do not use if you have an allergy to CHG or antibacterial soaps. If your skin becomes reddened/irritated stop using the CHG.  Do not shave (including legs and underarms) for at least 48 hours prior to first CHG shower. It is OK to shave your face.  Please follow these instructions carefully.   1. Shower the NIGHT BEFORE SURGERY and the MORNING OF SURGERY with CHG Soap.   2. If you chose to wash your hair, wash your hair first as usual with your normal shampoo.  3. After you shampoo, rinse your hair and body thoroughly to remove the shampoo.  4. Use CHG as you would any other liquid soap. You can apply CHG directly to the skin and wash gently with a scrungie or a clean washcloth.   5. Apply the CHG Soap  to your body ONLY FROM THE NECK DOWN.  Do not use on open wounds or open sores. Avoid contact with your eyes, ears, mouth and genitals (private parts). Wash Face and genitals (private parts)  with your normal soap.   6. Wash thoroughly, paying special attention to the area where your surgery will be performed.  7. Thoroughly rinse your body with warm water from the neck down.  8. DO NOT shower/wash with your normal soap after using and rinsing off the CHG Soap.  9. Pat yourself dry with a CLEAN TOWEL.  10. Wear CLEAN PAJAMAS to bed the night before surgery, wear comfortable clothes the morning of surgery  11. Place CLEAN  SHEETS on your bed the night of your first shower and DO NOT SLEEP WITH PETS.    Day of Surgery:  Do not apply any deodorants/lotions. Please shower the morning of surgery with the CHG soap  Please wear clean clothes to the hospital/surgery center.   Remember to brush your teeth WITH YOUR REGULAR TOOTHPASTE.   Please read over the following fact sheets that you were given.

## 2019-05-03 NOTE — Progress Notes (Signed)
PCP - [facility] Cardiologist - [facility]  Chest x-ray - not needed EKG - 05/03/19 Stress Test - denies ECHO - 04/07/17 Cardiac Cath - 08/2016  Patient stated that he has never been told he was diabetic but it is documented in his history that he is borderline diabetic.  A1c checked today : Aspirin Instructions: LD 04/28/19  ERAS Protcol - n/a PRE-SURGERY Ensure - n/a  COVID TEST- tomorrow 9/22   Anesthesia review: yes, cardiac history last visit with Mclean was 2019  Patient denies shortness of breath, fever, cough and chest pain at PAT appointment   Patient verbalized understanding of instructions that were given to them at the PAT appointment. Patient was also instructed that they will need to review over the PAT instructions again at home before surgery.

## 2019-05-04 ENCOUNTER — Other Ambulatory Visit (HOSPITAL_COMMUNITY): Payer: Medicaid Other

## 2019-05-10 ENCOUNTER — Telehealth (HOSPITAL_COMMUNITY): Payer: Self-pay

## 2019-05-10 ENCOUNTER — Ambulatory Visit (HOSPITAL_COMMUNITY): Payer: Medicaid Other | Admitting: Cardiology

## 2019-05-10 NOTE — Telephone Encounter (Signed)
Received preop clearance for patient however patient have not been seen in clinic since 3/19. Pt had visit for this morning and was a no show. Pt not cleared from our office.  Faxed back with same information.

## 2019-05-11 ENCOUNTER — Encounter: Payer: Self-pay | Admitting: Internal Medicine

## 2019-05-11 ENCOUNTER — Other Ambulatory Visit: Payer: Self-pay

## 2019-05-11 ENCOUNTER — Inpatient Hospital Stay (HOSPITAL_COMMUNITY)
Admission: RE | Admit: 2019-05-11 | Discharge: 2019-05-11 | Disposition: A | Discharge status: Home / Self Care | Payer: Medicaid Other | Source: Ambulatory Visit

## 2019-05-11 ENCOUNTER — Ambulatory Visit (INDEPENDENT_AMBULATORY_CARE_PROVIDER_SITE_OTHER): Payer: Medicaid Other | Admitting: Internal Medicine

## 2019-05-11 VITALS — BP 125/71 | HR 90 | Temp 98.7°F | Ht 72.0 in | Wt 199.6 lb

## 2019-05-11 DIAGNOSIS — I428 Other cardiomyopathies: Secondary | ICD-10-CM | POA: Diagnosis not present

## 2019-05-11 DIAGNOSIS — Z23 Encounter for immunization: Secondary | ICD-10-CM

## 2019-05-11 DIAGNOSIS — R39198 Other difficulties with micturition: Secondary | ICD-10-CM

## 2019-05-11 DIAGNOSIS — R3914 Feeling of incomplete bladder emptying: Secondary | ICD-10-CM | POA: Diagnosis not present

## 2019-05-11 DIAGNOSIS — I5022 Chronic systolic (congestive) heart failure: Secondary | ICD-10-CM | POA: Diagnosis not present

## 2019-05-11 DIAGNOSIS — M79605 Pain in left leg: Secondary | ICD-10-CM

## 2019-05-11 DIAGNOSIS — M48061 Spinal stenosis, lumbar region without neurogenic claudication: Secondary | ICD-10-CM

## 2019-05-11 DIAGNOSIS — I10 Essential (primary) hypertension: Secondary | ICD-10-CM

## 2019-05-11 DIAGNOSIS — I110 Hypertensive heart disease with heart failure: Secondary | ICD-10-CM

## 2019-05-11 DIAGNOSIS — N401 Enlarged prostate with lower urinary tract symptoms: Secondary | ICD-10-CM

## 2019-05-11 DIAGNOSIS — M79604 Pain in right leg: Secondary | ICD-10-CM

## 2019-05-11 DIAGNOSIS — Z599 Problem related to housing and economic circumstances, unspecified: Secondary | ICD-10-CM | POA: Insufficient documentation

## 2019-05-11 DIAGNOSIS — Z79899 Other long term (current) drug therapy: Secondary | ICD-10-CM | POA: Diagnosis not present

## 2019-05-11 DIAGNOSIS — R350 Frequency of micturition: Secondary | ICD-10-CM

## 2019-05-11 DIAGNOSIS — Z0000 Encounter for general adult medical examination without abnormal findings: Secondary | ICD-10-CM

## 2019-05-11 NOTE — Progress Notes (Signed)
PVR 26ml

## 2019-05-11 NOTE — Assessment & Plan Note (Signed)
BP 125/71 at this visit. Patient is on eplerenone, entresto and bidil. He denies any headache, dizziness, chest pain, shortness of breath or vision changes.   - Continue current regimen

## 2019-05-11 NOTE — Pre-Procedure Instructions (Signed)
Edward Mullen  05/11/2019    Your procedure is scheduled on Tuesday, May 18, 2019 at 10:15 AM.   Report to [facility] Entrance "A" Admitting Office at 8:15 AM.   Call this number if you have problems the morning of surgery: [phone]   Questions prior to day of surgery, please call [phone] between 8 & 4 PM.   Remember:  Do not eat or drink after midnight Monday, 05/17/19.  Take these medicines the morning of surgery with A SIP OF WATER: Bidil, Gabapentin (Neurontin), Rosuvastatin (Crestor)  Do not use NSAIDS (Ibuprofen, Aleve, etc), Aspirin products (BC Powders, Goody's, etc), Herbal medications, Fish Oil or Multivitamins prior to surgery.    Do not wear jewelry.  Do not wear lotions, powders, cologone or deodorant.  Men may shave face and neck.  Do not bring valuables to the hospital.  [facility] is not responsible for any belongings or valuables.  Contacts, dentures or bridgework may not be worn into surgery.  Leave your suitcase in the car.  After surgery it may be brought to your room.  For patients admitted to the hospital, discharge time will be determined by your treatment team.  [facility] - Preparing for Surgery  Before surgery, you can play an important role.  Because skin is not sterile, your skin needs to be as free of germs as possible.  You can reduce the number of germs on you skin by washing with CHG (chlorahexidine gluconate) soap before surgery.  CHG is an antiseptic cleaner which kills germs and bonds with the skin to continue killing germs even after washing.  Oral Hygiene is also important in reducing the risk of infection.  Remember to brush your teeth with your regular toothpaste the morning of surgery.  Please DO NOT use if you have an allergy to CHG or antibacterial soaps.  If your skin becomes reddened/irritated stop using the CHG and inform your nurse when you arrive at Short Stay.  Do not shave (including legs and underarms) for  at least 48 hours prior to the first CHG shower.  You may shave your face.  Please follow these instructions carefully:   1.  Shower with CHG Soap the night before surgery and the morning of Surgery.  2.  If you choose to wash your hair, wash your hair first as usual with your normal shampoo.  3.  After you shampoo, rinse your hair and body thoroughly to remove the shampoo. 4.  Use CHG as you would any other liquid soap.  You can apply chg directly to the skin and wash gently with a      scrungie or washcloth.           5.  Apply the CHG Soap to your body ONLY FROM THE NECK DOWN.   Do not use on open wounds or open sores. Avoid contact with your eyes, ears, mouth and genitals (private parts).  Wash genitals (private parts) with your normal soap - do this prior to using CHG soap.  6.  Wash thoroughly, paying special attention to the area where your surgery will be performed.  7.  Thoroughly rinse your body with warm water from the neck down.  8.  DO NOT shower/wash with your normal soap after using and rinsing off the CHG Soap.  9.  Pat yourself dry with a clean towel.            10.  Wear clean pajamas.  11.  Place clean sheets on your bed the night of your first shower and do not sleep with pets.  Day of Surgery  Shower as above. Do not apply any lotions/deodorants the morning of surgery.   Please wear clean clothes to the hospital. Remember to brush your teeth with toothpaste.  Please read over the fact sheets that you were given.

## 2019-05-11 NOTE — Assessment & Plan Note (Signed)
Edward Mullen has chronic bilateral lower extremity pain for several years secondary to spinal stenosis.  He was previously seen for these concerns in July.  Continues to bilateral leg pain especially in the posterior thighs radiating down to his feet.  Also endorses feeling of sitting on a pillow.  He was switched over from gabapentin to pregabalin at the last visit.  He was also referred to neurosurgery for evaluation and candidacy for surgery.  Patient reports that he did not have any relief with the pregabalin.  He is scheduled for surgery for correction of the spinal stenosis on until Thursday.  He will follow-up with physical therapy afterwards for strengthening exercises.  -Neurosurgery for correction of lumbar spinal stenosis 10/6

## 2019-05-11 NOTE — Patient Instructions (Addendum)
Edward Mullen,  It was a pleasure seeing you in clinic today. I wish you the best of luck with your upcoming surgery.  I have placed a referral to social work for further discussion of your housing needs.  In concerns for your urinary symptoms, we did a bladder scan today which did not show any urinary retention. We will continue to monitor your symptoms after surgery.  Please follow up with Dr. Aundra Dubin for your surgical clearance. [address], [place], [place] [address] [phone]  Please contact us if you have any concerns.    Thank you!

## 2019-05-11 NOTE — Assessment & Plan Note (Addendum)
Patient with history of nonischemic cardiomyopathy with EF 50% in 2018. He was last evaluated by Dr. Aundra Dubin, cardiologist, in March 2019. He is currently on Entresto, Eplerenone, Bidil and Lasix. Patient is compliant with medications. He is scheduled for lumbar surgery to correct spinal stenosis on October 6th. Patient informed that he will need to meet with his cardiologist for clearance/risk stratification prior to surgery. He is agreeable with the plan and number is given to call [employer] to schedule an appointment prior to his surgery.  - Continue current regimen - Patient to schedule appointment with Dr. Aundra Dubin

## 2019-05-11 NOTE — Assessment & Plan Note (Signed)
Patient is currently living with his daughter. Per patient, there seems to be conflict between him and his daughter and he wants to leave. He would like to get his own place prior to getting surgery so he can settle in. He would like resources to find a place to live on his own. Patient is currently unemployed.  -Referral to SW placed.

## 2019-05-11 NOTE — Progress Notes (Signed)
   CC: CHF f/u, difficulty urinating   HPI:  [name] is a 63 y.o. male with PMHx as listed below presenting to the clinic today for follow up of his CHF and HTN and also concerns for difficulty urinating for one month. Please see problem based charting for further assessment and plan.   Past Medical History:  Diagnosis Date  . Arthritis    "feels like it in my legs" (09/20/2014)  . Borderline type 2 diabetes mellitus    patient denies  . CHF (congestive heart failure) ([facility])   . Hypertension    Review of Systems:  Review of Systems  Constitutional: Negative for fever and malaise/fatigue.  HENT: Negative for ear pain, sinus pain and sore throat.   Eyes: Negative for blurred vision, double vision and photophobia.  Respiratory: Negative for cough, shortness of breath and wheezing.   Cardiovascular: Negative for chest pain, palpitations, claudication and leg swelling.  Gastrointestinal: Negative for abdominal pain, blood in stool, constipation, diarrhea, melena, nausea and vomiting.  Genitourinary: Positive for frequency. Negative for dysuria, hematuria and urgency.  Musculoskeletal: Positive for back pain. Negative for falls and joint pain.  Neurological: Negative for dizziness, tingling, sensory change, focal weakness, weakness and headaches.     Physical Exam:  Vitals:   05/11/19 1329  BP: 125/71  Pulse: 90  Temp: 98.7 F (37.1 C)  TempSrc: Oral  SpO2: 98%  Weight: 199 lb 9.6 oz (90.5 kg)  Height: 6' (1.829 m)   Physical Exam Constitutional:      General: He is not in acute distress.    Appearance: Normal appearance.  HENT:     Head: Normocephalic and atraumatic.  Eyes:     General: No scleral icterus.    Extraocular Movements: Extraocular movements intact.     Conjunctiva/sclera: Conjunctivae normal.     Pupils: Pupils are equal, round, and reactive to light.  Cardiovascular:     Rate and Rhythm: Normal rate and regular rhythm.     Pulses: Normal  pulses.     Heart sounds: Normal heart sounds.  Pulmonary:     Effort: Pulmonary effort is normal.     Breath sounds: Normal breath sounds.  Abdominal:     General: Abdomen is flat. There is no distension.     Palpations: Abdomen is soft.     Tenderness: There is no abdominal tenderness. There is no guarding.  Musculoskeletal: Normal range of motion.        General: No swelling or tenderness.  Skin:    General: Skin is warm and dry.     Capillary Refill: Capillary refill takes less than 2 seconds.  Neurological:     General: No focal deficit present.     Mental Status: He is alert and oriented to person, place, and time.     Sensory: No sensory deficit.     Motor: No weakness.     Coordination: Coordination normal.     Gait: Gait normal.      Assessment & Plan:   See Encounters Tab for problem based charting.  Patient seen with Dr. Lynnae January

## 2019-05-11 NOTE — Progress Notes (Signed)
Pt did not show for PAT appt. Called home number listed and was given his cell phone number to call. Called his cell number and there was no answer and no voicemail available to leave message. I spoke with Lexine Baton at Dr. Cleotilde Neer office and she is aware that pt missed his cardiology appt yesterday and now this appt. She asked me to call the home number again which is his daughter's number and let daughter know that pt hasn't shown for his appt. I called the daughter's number and it just then message stated that a voicemail box had not been set up for this number.

## 2019-05-11 NOTE — Assessment & Plan Note (Signed)
Flu vaccine given at this visit. 

## 2019-05-11 NOTE — Assessment & Plan Note (Signed)
Patient reports difficulty in initiating and stopping his urine stream and sense of incomplete bladder emptying resulting in him needing to use the bathroom every 10-15 minutes. He feels like at time, he is unable to completely empty his bladder even after repeated voids. He reports symptoms have been present for the past month. He denies any dysuria or urinary incontinence. On chart review, patient had similar symptoms in 2019 concerning for BPH; however, symptoms resolved without initiation of Flomax.  No family history of prostate cancer. His current symptoms also seem likely secondary to BPH. In absence of urinary or fecal incontinence, it is less likely that his symptoms are secondary to the spinal stenosis.  PVR in office was 59mL. No concerns for urinary retention at this point. If patient continues to experience symptoms after his surgery, will consider starting him on Flomax.   - Continue to monitor - Flomax if symptoms persist

## 2019-05-12 NOTE — H&P (Deleted)
Chief Complaint   Back pain  HPI   HPI: Edward Mullen is a 63 y.o. male with longstanding history of chronic back and bilateral leg pain.   Pain is constant but exacerbated by walking.   He underwent an MRI of his lumbar spine which revealed severe multifactorial stenosis at L3-4, L4-5 and L5-S1.  He has failed reasonable conservative treatment including p.o. medication, physical therapy and epidural injections. He presents today for surgical decompression fusion.  He is without any concerns.  Patient Active Problem List   Diagnosis Date Noted  . Healthcare maintenance 05/11/2019  . Housing problems 05/11/2019  . Difficulty urinating 04/07/2018  . Colon cancer screening 04/07/2018  . Anemia 12/19/2016  . CKD (chronic kidney disease) stage 3, GFR 30-59 ml/min (HCC) 12/13/2016  . Chronic systolic heart failure ([place]) 09/06/2016  . Non-ischemic cardiomyopathy ([facility]) 08/27/2016  . 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 Mitral regurgitation 08/27/2016  . Polysubstance abuse ([facility])   . Tobacco abuse 09/19/2014  . HTN (hypertension) 03/28/2011  . Bilateral lower extremity pain 03/28/2011    PMH: Past Medical History:  Diagnosis Date  . Arthritis    "feels like it in my legs" (09/20/2014)  . Borderline type 2 diabetes mellitus    patient denies  . CHF (congestive heart failure) ([facility])   . Hypertension   . NSVT (nonsustained ventricular tachycardia) ([facility]) 09/06/2016    PSH: Past Surgical History:  Procedure Laterality Date  . CARDIAC CATHETERIZATION N/A 08/27/2016   Procedure: Right/Left Heart Cath and Coronary Angiography;  Surgeon: Larey Dresser, MD;  Location: [facility];  Service: Cardiovascular;  Laterality: N/A;  . COLONOSCOPY    . CYSTECTOMY Right    "back of my shoulder"  . TONSILLECTOMY      No medications prior to admission.    SH: Social History   Tobacco Use  . Smoking status: Current Every Day Smoker    Packs/day: 0.10     Years: 48.00    Pack years: 4.80    Types: Cigarettes  . Smokeless tobacco: Never Used  . Tobacco comment: cutting back 2  per day  Substance Use Topics  . Alcohol use: Yes    Alcohol/week: 1.0 standard drinks    Types: 1 Cans of beer per week    Comment: occasionally  . Drug use: Yes    Types: Marijuana    Comment: daily 4 times    MEDS: Prior to Admission medications   Medication Sig Start Date End Date Taking? Authorizing Provider  BIDIL 20-37.5 MG tablet TAKE 2 TABLETS BY MOUTH 3 (THREE) TIMES A DAY Patient taking differently: Take 2 tablets by mouth 3 (three) times daily.  02/23/19  Yes Larey Dresser, MD  ENTRESTO 49-51 MG TAKE ONE TABLET BY MOUTH TWICE DAILY 04/12/19  Yes Larey Dresser, MD  eplerenone (INSPRA) 25 MG tablet TAKE ONE TABLET BY MOUTH ONCE DAILY 04/12/19  Yes Larey Dresser, MD  furosemide (LASIX) 20 MG tablet TAKE ONE TABLET BY MOUTH ONCE DAILY 02/23/19  Yes Larey Dresser, MD  gabapentin (NEURONTIN) 300 MG capsule TAKE 2 CAPSULES (600 MG TOTAL) BY MOUTH 2 (TWO) TIMES DAILY. 01/19/19  Yes Dorrell, Andree Elk, MD  rosuvastatin (CRESTOR) 5 MG tablet TAKE ONE TABLET BY MOUTH ONCE DAILY 04/12/19  Yes Larey Dresser, MD  aspirin 81 MG chewable tablet Chew 1 tablet (81 mg total) by mouth daily. Patient not taking: Reported on 04/23/2019 09/23/16   Shela Leff, MD  pregabalin (LYRICA) 75 MG capsule Take 1 capsule (75 mg total) by mouth 2 (two) times daily. Patient not taking: Reported on 04/23/2019 03/09/19   Harvie Heck, MD    ALLERGY: No Known Allergies  Social History   Tobacco Use  . Smoking status: Current Every Day Smoker    Packs/day: 0.10    Years: 48.00    Pack years: 4.80    Types: Cigarettes  . Smokeless tobacco: Never Used  . Tobacco comment: cutting back 2  per day  Substance Use Topics  . Alcohol use: Yes    Alcohol/week: 1.0 standard drinks    Types: 1 Cans of beer per week    Comment: occasionally     Family History  Problem  Relation Age of Onset  . Stroke Mother 87  . Heart disease Father 27  . Colon cancer Neg Hx   . Colon polyps Neg Hx   . Esophageal cancer Neg Hx   . Stomach cancer Neg Hx   . Rectal cancer Neg Hx      ROS   ROS  Exam   There were no vitals filed for this visit. General appearance: WDWN, NAD Eyes: No scleral injection Cardiovascular: Regular rate and rhythm without murmurs, rubs, gallops. No edema or variciosities. Distal pulses normal. Pulmonary: Effort normal, non-labored breathing Musculoskeletal:     Muscle tone upper extremities: Normal    Muscle tone lower extremities: Normal    Motor exam: Upper Extremities Deltoid Bicep Tricep Grip  Right 5/5 5/5 5/5 5/5  Left 5/5 5/5 5/5 5/5   Lower Extremity IP Quad PF DF EHL  Right 5/5 5/5 5/5 5/5 5/5  Left 5/5 5/5 5/5 5/5 5/5   Neurological Mental Status:    - Patient is awake, alert, oriented to person, place, month, year, and situation    - Patient is able to give a clear and coherent history.    - No signs of aphasia or neglect Cranial Nerves    - II: Visual Fields are full. PERRL    - III/IV/VI: EOMI without ptosis or diploplia.     - V: Facial sensation is grossly normal    - VII: Facial movement is symmetric.     - VIII: hearing is intact to voice    - X: Uvula elevates symmetrically    - XI: Shoulder shrug is symmetric.    - XII: tongue is midline without atrophy or fasciculations.  Sensory: Sensation grossly intact to LT  Results - Imaging/Labs   No results found for this or any previous visit (from the past 48 hour(s)).  No results found.  IMAGING: MRI of the lumbar spine dated 04/21/2019 was reviewed.  This demonstrates severe multifactorial stenosis at L3-4, L4-5, and L5-S1.  At all 3 levels there is significant facet arthropathy and ligamentous hypertrophy.  In addition, at L5-S1 there is also disc desiccation with loss of height.  There is associated reactive endplate changes at L5 and S1.   Impression/Plan   63 y.o. 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  He has had progressive symptoms despite multiple conservative treatments including medication, physical therapy, and injection therapy.  His pain continues to hinder normal daily activities.   We will proceed with decompression and fusion at L3-4, L4-5 and L5-S1 including interbody arthrodesis, interbody cages and posterior segmental instrumentation from L3-S1.    While in the office risks, benefits and  alternatives were discussed.  Patient stated understanding and wished to proceed.  Ferne Reus, PA-C [facility] [employer]

## 2019-05-14 ENCOUNTER — Other Ambulatory Visit (HOSPITAL_COMMUNITY): Payer: Medicare Other

## 2019-05-15 NOTE — Progress Notes (Signed)
Internal Medicine Clinic Attending  I saw and evaluated the patient.  I personally confirmed the key portions of the history and exam documented by Dr. Aslam and I reviewed pertinent patient test results.  The assessment, diagnosis, and plan were formulated together and I agree with the documentation in the resident's note.     

## 2019-05-17 ENCOUNTER — Telehealth: Payer: Self-pay | Admitting: *Deleted

## 2019-05-17 NOTE — Telephone Encounter (Signed)
Spoke with Dr. Marva Panda on 05/11/2019 and notified her that in order for patient to receive a [facility] SW, patient would require another discipline such as [facility] RN or [facility]. Unfortunately, a referral to SW (not HH) does not go anywhere. Spoke with patient and list of Housing Resources in [place] mailed to patient at current address. Hubbard Hartshorn, BSN, RN-BC

## 2019-05-28 ENCOUNTER — Other Ambulatory Visit: Payer: Self-pay | Admitting: Neurosurgery

## 2019-05-28 NOTE — Pre-Procedure Instructions (Signed)
Edward Mullen  05/28/2019      [place], [place] [address] Phone: [phone] Fax: [phone]    Your procedure is scheduled on Oct. 27  Report to [facility] Entrance A at 5:30 A.M.  Call this number if you have problems the morning of surgery:  [phone]   Remember:  Do not eat or drink after midnight.      Take these medicines the morning of surgery with A SIP OF WATER :               bidil              entresto              Gabapentin (neurontin)              Rosuvastatin (crestor)              7 days prior to surgery STOP taking any Aspirin (unless otherwise instructed by your surgeon), Aleve, Naproxen, Ibuprofen, Motrin, Advil, Goody's, BC's, all herbal medications, fish oil, and all vitamins.                Follow your surgeon's instructions on when to stop Aspirin.  If no instructions were given by your surgeon then you will need to call the office to get those instructions.        Do not wear jewelry.  Do not wear lotions, powders, or perfumes, or deodorant.  Do not shave 48 hours prior to surgery.  Men may shave face and neck.  Do not bring valuables to the hospital.  [facility] is not responsible for any belongings or valuables.  Contacts, dentures or bridgework may not be worn into surgery.  Leave your suitcase in the car.  After surgery it may be brought to your room.  For patients admitted to the hospital, discharge time will be determined by your treatment team.  Patients discharged the day of surgery will not be allowed to drive home.    Special instructions:  [facility]- Preparing For Surgery  Before surgery, you can play an important role. Because skin is not sterile, your skin needs to be as free of germs as possible. You can reduce the number of germs on your skin by washing with CHG (chlorahexidine gluconate) Soap before surgery.  CHG is an  antiseptic cleaner which kills germs and bonds with the skin to continue killing germs even after washing.    Oral Hygiene is also important to reduce your risk of infection.  Remember - BRUSH YOUR TEETH THE MORNING OF SURGERY WITH YOUR REGULAR TOOTHPASTE  Please do not use if you have an allergy to CHG or antibacterial soaps. If your skin becomes reddened/irritated stop using the CHG.  Do not shave (including legs and underarms) for at least 48 hours prior to first CHG shower. It is OK to shave your face.  Please follow these instructions carefully.   1. Shower the NIGHT BEFORE SURGERY and the MORNING OF SURGERY with CHG.   2. If you chose to wash your hair, wash your hair first as usual with your normal shampoo.  3. After you shampoo, rinse your hair and body thoroughly to remove the shampoo.  4. Use CHG as you would any other liquid soap. You can apply CHG directly to the skin and wash gently with a scrungie or a clean washcloth.  5. Apply the CHG Soap to your body ONLY FROM THE NECK DOWN.  Do not use on open wounds or open sores. Avoid contact with your eyes, ears, mouth and genitals (private parts). Wash Face and genitals (private parts)  with your normal soap.  6. Wash thoroughly, paying special attention to the area where your surgery will be performed.  7. Thoroughly rinse your body with warm water from the neck down.  8. DO NOT shower/wash with your normal soap after using and rinsing off the CHG Soap.  9. Pat yourself dry with a CLEAN TOWEL.  10. Wear CLEAN PAJAMAS to bed the night before surgery, wear comfortable clothes the morning of surgery  11. Place CLEAN SHEETS on your bed the night of your first shower and DO NOT SLEEP WITH PETS.    Day of Surgery:  Do not apply any deodorants/lotions.  Please wear clean clothes to the hospital/surgery center.   Remember to brush your teeth WITH YOUR REGULAR TOOTHPASTE.    Please read over the following fact sheets that you  were given. Coughing and Deep Breathing, MRSA Information and Surgical Site Infection Prevention

## 2019-05-31 ENCOUNTER — Encounter (HOSPITAL_COMMUNITY): Payer: Self-pay | Admitting: Cardiology

## 2019-05-31 ENCOUNTER — Ambulatory Visit (HOSPITAL_COMMUNITY)
Admission: RE | Admit: 2019-05-31 | Discharge: 2019-05-31 | Disposition: A | Discharge status: Home / Self Care | Payer: Medicare Other | Source: Ambulatory Visit | Attending: Cardiology | Admitting: Cardiology

## 2019-05-31 ENCOUNTER — Other Ambulatory Visit: Payer: Self-pay

## 2019-05-31 ENCOUNTER — Encounter (HOSPITAL_COMMUNITY): Payer: Self-pay

## 2019-05-31 ENCOUNTER — Encounter (HOSPITAL_COMMUNITY)
Admission: RE | Admit: 2019-05-31 | Discharge: 2019-05-31 | Disposition: A | Discharge status: Home / Self Care | Payer: Medicare Other | Source: Ambulatory Visit | Attending: Neurosurgery | Admitting: Neurosurgery

## 2019-05-31 VITALS — BP 108/74 | HR 96 | Wt 192.0 lb

## 2019-05-31 DIAGNOSIS — Z79899 Other long term (current) drug therapy: Secondary | ICD-10-CM | POA: Insufficient documentation

## 2019-05-31 DIAGNOSIS — M545 Low back pain: Secondary | ICD-10-CM | POA: Diagnosis not present

## 2019-05-31 DIAGNOSIS — I429 Cardiomyopathy, unspecified: Secondary | ICD-10-CM | POA: Diagnosis not present

## 2019-05-31 DIAGNOSIS — E1122 Type 2 diabetes mellitus with diabetic chronic kidney disease: Secondary | ICD-10-CM | POA: Insufficient documentation

## 2019-05-31 DIAGNOSIS — I5022 Chronic systolic (congestive) heart failure: Secondary | ICD-10-CM | POA: Diagnosis not present

## 2019-05-31 DIAGNOSIS — I130 Hypertensive heart and chronic kidney disease with heart failure and stage 1 through stage 4 chronic kidney disease, or unspecified chronic kidney disease: Secondary | ICD-10-CM | POA: Diagnosis not present

## 2019-05-31 DIAGNOSIS — Z8249 Family history of ischemic heart disease and other diseases of the circulatory system: Secondary | ICD-10-CM | POA: Insufficient documentation

## 2019-05-31 DIAGNOSIS — I2510 Atherosclerotic heart disease of native coronary artery without angina pectoris: Secondary | ICD-10-CM | POA: Diagnosis not present

## 2019-05-31 DIAGNOSIS — Z87891 Personal history of nicotine dependence: Secondary | ICD-10-CM | POA: Insufficient documentation

## 2019-05-31 DIAGNOSIS — I428 Other cardiomyopathies: Secondary | ICD-10-CM | POA: Diagnosis not present

## 2019-05-31 DIAGNOSIS — N5089 Other specified disorders of the male genital organs: Secondary | ICD-10-CM | POA: Diagnosis not present

## 2019-05-31 DIAGNOSIS — I472 Ventricular tachycardia: Secondary | ICD-10-CM | POA: Diagnosis not present

## 2019-05-31 HISTORY — DX: Cardiac arrhythmia, unspecified: I49.9

## 2019-05-31 LAB — TYPE AND SCREEN
ABO/RH(D): O POS
Antibody Screen: NEGATIVE

## 2019-05-31 LAB — SURGICAL PCR SCREEN
MRSA, PCR: NEGATIVE
Staphylococcus aureus: NEGATIVE

## 2019-05-31 LAB — COMPREHENSIVE METABOLIC PANEL
ALT: 20 U/L (ref 0–44)
AST: 22 U/L (ref 15–41)
Albumin: 3.7 g/dL (ref 3.5–5.0)
Alkaline Phosphatase: 90 U/L (ref 38–126)
Anion gap: 8 (ref 5–15)
BUN: 14 mg/dL (ref 8–23)
CO2: 25 mmol/L (ref 22–32)
Calcium: 9.4 mg/dL (ref 8.9–10.3)
Chloride: 107 mmol/L (ref 98–111)
Creatinine, Ser: 1.33 mg/dL — ABNORMAL HIGH (ref 0.61–1.24)
GFR calc Af Amer: >60 mL/min (ref >60)
GFR calc non Af Amer: 56 mL/min — ABNORMAL LOW (ref >60)
Glucose, Bld: 128 mg/dL — ABNORMAL HIGH (ref 70–99)
Potassium: 3.8 mmol/L (ref 3.5–5.1)
Sodium: 140 mmol/L (ref 135–145)
Total Bilirubin: 0.8 mg/dL (ref 0.3–1.2)
Total Protein: 7.3 g/dL (ref 6.5–8.1)

## 2019-05-31 LAB — CBC
HCT: 45 % (ref 39.0–52.0)
Hemoglobin: 14.8 g/dL (ref 13.0–17.0)
MCH: 29.1 pg (ref 26.0–34.0)
MCHC: 32.9 g/dL (ref 30.0–36.0)
MCV: 88.4 fL (ref 80.0–100.0)
Platelets: 157 10*3/uL (ref 150–400)
RBC: 5.09 MIL/uL (ref 4.22–5.81)
RDW: 14.1 % (ref 11.5–15.5)
WBC: 5.3 10*3/uL (ref 4.0–10.5)
nRBC: 0 % (ref 0.0–0.2)

## 2019-05-31 LAB — GLUCOSE, CAPILLARY: Glucose-Capillary: 152 mg/dL — ABNORMAL HIGH (ref 70–99)

## 2019-05-31 LAB — LIPID PANEL
Cholesterol: 105 mg/dL (ref 0–200)
HDL: 42 mg/dL (ref >40)
LDL Cholesterol: 51 mg/dL (ref 0–99)
Total CHOL/HDL Ratio: 2.5 RATIO
Triglycerides: 62 mg/dL (ref <150)
VLDL: 12 mg/dL (ref 0–40)

## 2019-05-31 NOTE — Pre-Procedure Instructions (Signed)
ABASI GAIDA  05/31/2019      [place], [place] - [address] [place] [address] Phone: [phone] Fax: [phone]    Your procedure is scheduled on Oct. 27, 2020.  Report to [facility] Entrance A at 5:30 A.M.  Call this number if you have problems the morning of surgery:  [phone]   Remember:  Do not eat or drink after midnight.     Take these medicines the morning of surgery with A SIP OF WATER :              Bidil              Gabapentin (neurontin)              Rosuvastatin (crestor)              7 days prior to surgery STOP taking any Aspirin (unless otherwise instructed by your surgeon), Aleve, Naproxen, Ibuprofen, Motrin, Advil, Goody's, BC's, all herbal medications, fish oil, and all vitamins.                Follow your surgeon's instructions on when to stop Aspirin.  If no instructions were given by your surgeon then you will need to call the office to get those instructions.      Do not wear jewelry.  Do not wear lotions, powders, or perfumes, or deodorant.  Do not shave 48 hours prior to surgery.  Men may shave face and neck.  Do not bring valuables to the hospital.  [facility] is not responsible for any belongings or valuables.  Contacts, dentures or bridgework may not be worn into surgery.  Leave your suitcase in the car.  After surgery it may be brought to your room.  For patients admitted to the hospital, discharge time will be determined by your treatment team.  Patients discharged the day of surgery will not be allowed to drive home.    Special instructions:  [place] [facility]- Preparing For Surgery  Before surgery, you can play an important role. Because skin is not sterile, your skin needs to be as free of germs as possible. You can reduce the number of germs on your skin by washing with CHG (chlorahexidine gluconate) Soap before surgery.  CHG is an antiseptic cleaner which  kills germs and bonds with the skin to continue killing germs even after washing.    Oral Hygiene is also important to reduce your risk of infection.  Remember - BRUSH YOUR TEETH THE MORNING OF SURGERY WITH YOUR REGULAR TOOTHPASTE  Please do not use if you have an allergy to CHG or antibacterial soaps. If your skin becomes reddened/irritated stop using the CHG.  Do not shave (including legs and underarms) for at least 48 hours prior to first CHG shower. It is OK to shave your face.  Please follow these instructions carefully.   1. Shower the NIGHT BEFORE SURGERY and the MORNING OF SURGERY with CHG.   2. If you chose to wash your hair, wash your hair first as usual with your normal shampoo.  3. After you shampoo, rinse your hair and body thoroughly to remove the shampoo.  4. Use CHG as you would any other liquid soap. You can apply CHG directly to the skin and wash gently with a scrungie or a clean washcloth.   5. Apply the CHG Soap to your body ONLY FROM THE NECK DOWN.  Do not use  on open wounds or open sores. Avoid contact with your eyes, ears, mouth and genitals (private parts). Wash Face and genitals (private parts)  with your normal soap.  6. Wash thoroughly, paying special attention to the area where your surgery will be performed.  7. Thoroughly rinse your body with warm water from the neck down.  8. DO NOT shower/wash with your normal soap after using and rinsing off the CHG Soap.  9. Pat yourself dry with a CLEAN TOWEL.  10. Wear CLEAN PAJAMAS to bed the night before surgery, wear comfortable clothes the morning of surgery  11. Place CLEAN SHEETS on your bed the night of your first shower and DO NOT SLEEP WITH PETS.    Day of Surgery:  Do not apply any deodorants/lotions.  Please wear clean clothes to the hospital/surgery center.   Remember to brush your teeth WITH YOUR REGULAR TOOTHPASTE.    Please read over the following fact sheets that you were given. Coughing  and Deep Breathing, MRSA Information and Surgical Site Infection Prevention

## 2019-05-31 NOTE — Progress Notes (Addendum)
PCP:Dr. Vilma Prader Cardiologist: Dr. Gerri Lins appt later today for cardiac clearance  EKG: 05-03-2019 CXR: na ECHO: 03/2017 Stress Test: denies Cardiac Cath: 08/2016  Pt reports he he "pre-diabetetic".  Fasting CBG at PAT appt 152.  A1C 5.3 on 05/03/19  Pt reports he is "essentially homeless" right now.  Spoke with Gail/Lindsi flow coordinator--will need Covid test DOS.  Pt reports he has a case manager working with him and hopes to have an appt by time of surgery.  Working on [employer], but states "I will be here."  Called surgeon's office and spoke Lexine Baton to made aware of current living arrangements/need for [employer] testing.  Instructed no smoking 24 hours prior to surgery--verbalized understanding  Chart forwarded to anesthesia  Patient denies shortness of breath, fever, cough, and chest pain at PAT appointment.  Patient verbalized understanding of instructions provided today at the PAT appointment.  Patient asked to review instructions at home and day of surgery.

## 2019-05-31 NOTE — Patient Instructions (Signed)
Labs today We will only contact you if something comes back abnormal or we need to make some changes. Otherwise no news is good news!   You have been ordered for a testicular ultrasound.  Please follow up with you primary doctor to address your concerns with the swelling  Your physician has requested that you have an echocardiogram. Echocardiography is a painless test that uses sound waves to create images of your heart. It provides your doctor with information about the size and shape of your heart and how well your heart's chambers and valves are working. This procedure takes approximately one hour. There are no restrictions for this procedure.  Your physician recommends that you schedule a follow-up appointment in: 6 months with Dr Aundra Dubin.  At the [facility], you and your health needs are our priority. As part of our continuing mission to provide you with exceptional heart care, we have created designated Provider Care Teams. These Care Teams include your primary Cardiologist (physician) and Advanced Practice Providers (APPs- Physician Assistants and Nurse Practitioners) who all work together to provide you with the care you need, when you need it.   You may see any of the following providers on your designated Care Team at your next follow up: Marland Kitchen Dr Glori Bickers . Dr Loralie Champagne . Darrick Grinder, NP . Lyda Jester, PA   Please be sure to bring in all your medications bottles to every appointment.

## 2019-06-01 NOTE — Progress Notes (Signed)
Advanced Heart Failure Clinic Note   Primary Care: Dr. Marlowe Sax Primary Cardiologist: Dr. Aundra Dubin   HPI: Edward Mullen is a 63 y.o. male with history of HTN, DM, chronic systolic CHF, and polysubstance abuse.   Admitted 08/23/16 with acute systolic CHF in setting of substance abuse. Echo was done, showing EF 15-20%.  Diuresed with IV lasix and meds adjusted as tolerated.  UDS + for cocaine on admission. Cardiac cath showed coronary disease but not significant enough to explain cardiomyopathy. Down 23 lbs from admission weight with diuresis.  Discharge weight 193 lbs.  He was positive for cocaine again in 2/18.    He was admitted with syncope in 5/18 after using cocaine.  He was noted to have NSVT on telemetry.  Syncope suspected due to VT, no ICD given active substance abuse but had Lifevest placed. EF 50% on 8/18 echo, so Lifevest subsequently removed.  He presents today for followup of CHF.  No cocaine or ETOH, occasional marijuana. No cigarettes for the last 2 wks.  He has low back pain/spinal arthritis/stenosis with operation planned soon.  He is off ASA for surgery. No exertional dyspnea or chest pain.  Incidentally, he reports that he has had a scrotal mass.      ECG (personally reviewed): NSR, IVCD 130 msec.   Labs (1/18): Digoxin 0.4, BNP 961 Labs (2/18): K 4, creatinine 1.38 Labs (3/18): K 4.4, creatinine 1.85, digoxin 0.4 Labs (5/18): hgb 12.6, K 4.3, creatinine 1.3, digoxin 0.5 Labs (6/18): TSH normal, K 4.4, creatinine 2.04, AST 42, ALT 37 Labs (8/18): K 4.8, creatinine 1.65 Labs (11/18): K 4.1, creatinine 1.77 Labs (10/20): K 3.8, creatinine 1.33, hgb 14.9  ROS: All systems reviewed and negative except as per HPI.   Social History: Lives in [place] with daughter. Prior heavy ETOH, now quit.  Quit smoking 10/20. Has quit using cocaine.  Active marijuana.   Family History  Problem Relation Age of Onset  . Stroke Mother 58  . Heart disease Father 63  . Colon cancer  Neg Hx   . Colon polyps Neg Hx   . Esophageal cancer Neg Hx   . Stomach cancer Neg Hx   . Rectal cancer Neg Hx    Past Medical History: 1. HTN  2. Type II diabetes 3. Cocaine abuse 4. Active smoking, suspect COPD.  5. Cardiomyopathy: Nonischemic.  - Echo (1/18) with EF 15-20%, moderate LVH, moderate AI, moderate to severe MR, normal RV size with mildly decreased systolic function, PASP 44 mmHg.  - LHC/RHC (1/18): 80% stenosis in PLV branch.  Mean RA 5, PA 47/20 mean 32, mean PCWP 22, CI 2.01.  - Echo (8/18): EF 50% with moderate LVH, diffuse hypokinesis, normal RV size and systolic function, mild AI, trivial MR.  6. CAD: LHC (1/18) with 80% stenosis in a branch of the PLV (not enough CAD to explain cardiomyopathy).  7. Mitral regurgitation: Moderate to severe on 1/18 echo, probably functional.  Trivial MR on 8/18 echo.  8. CKD: Stage 3.  9. Syncope: 5/18 in setting of cocaine use, concern for VT.  10. Lower extremity arterial dopplers (7/18): No significant PAD.  11. Sciatica   Current Outpatient Medications  Medication Sig Dispense Refill  . BIDIL 20-37.5 MG tablet TAKE 2 TABLETS BY MOUTH 3 (THREE) TIMES A DAY (Patient taking differently: Take 2 tablets by mouth 3 (three) times daily. ) 180 tablet 0  . ENTRESTO 49-51 MG TAKE ONE TABLET BY MOUTH TWICE DAILY 60 tablet 2  .  eplerenone (INSPRA) 25 MG tablet TAKE ONE TABLET BY MOUTH ONCE DAILY 30 tablet 5  . furosemide (LASIX) 20 MG tablet TAKE ONE TABLET BY MOUTH ONCE DAILY 30 tablet 0  . gabapentin (NEURONTIN) 300 MG capsule TAKE 2 CAPSULES (600 MG TOTAL) BY MOUTH 2 (TWO) TIMES DAILY. 120 capsule 3  . pregabalin (LYRICA) 75 MG capsule Take 1 capsule (75 mg total) by mouth 2 (two) times daily. 60 capsule 3  . rosuvastatin (CRESTOR) 5 MG tablet TAKE ONE TABLET BY MOUTH ONCE DAILY 30 tablet 5   Current Facility-Administered Medications  Medication Dose Route Frequency Provider Last Rate Last Dose  . 0.9 %  sodium chloride infusion  500  mL Intravenous Once Thornton Park, MD        No Known Allergies    Vitals:   05/31/19 1524  BP: 108/74  Pulse: 96  SpO2: 98%  Weight: 87.1 kg (192 lb)   Wt Readings from Last 3 Encounters:  05/31/19 87.1 kg (192 lb)  05/31/19 86.8 kg (191 lb 6.4 oz)  05/11/19 90.5 kg (199 lb 9.6 oz)     PHYSICAL EXAM: General: NAD Neck: No JVD, no thyromegaly or thyroid nodule.  Lungs: Clear to auscultation bilaterally with normal respiratory effort. CV: Nondisplaced PMI.  Heart regular S1/S2, no S3/S4, 2/6 early SEM RUSB.  No peripheral edema.  No carotid bruit.  Normal pedal pulses.  Abdomen: Soft, nontender, no hepatosplenomegaly, no distention.  Skin: Intact without lesions or rashes.  Neurologic: Alert and oriented x 3.  Psych: Normal affect. Extremities: No clubbing or cyanosis.  HEENT: Normal.   ASSESSMENT & PLAN:  1. Chronic systolic CHF: EF 0000000 with moderate to severe MR on echo 1/18. Nonischemic cardiomyopathy. HTN vs cocaine use vs viral vs ETOH. HIV negative, SPEP with no M-spike. Patient does have CAD but not enough to explain cardiomyopathy.  Echo in 8/18 showed EF up to 50%.  Today, NYHA class I-II symptoms and no volume overload on exam. - He is on low dose Lasix 20 mg daily.  - I am going to get repeat echo to make sure EF has remained improved.   - Continue eplerenone (breast tenderness with spironolactone, now resolved).    - Not on Coreg given h/o cocaine abuse (he has now stopped).  Will hold off on starting Coreg given improvement in EF to 50%.   If EF lower on echo, will need to start Coreg.  - Continue Entresto 49/51 bid.  - Continue Bidil 2 tabs tid.   - Continue to avoid cocaine and ETOH => abstinence may have helped LV function.   - EF too high for ICD at this point.   2. HTN: BP controlled on current meds.     3. Polysubstance abuse:  I encouraged him to stay off cocaine and ETOH.    4. NSVT: Short runs during last hospitalization.  Concern that VT  caused 5/18 syncope (no recurrence). EF has now improved to 50%.  5. Smoking:He has quit smoking for his surgery.  I congratulated him.  6. CKD: Stage 3.  BMET today.  7. CAD: 80% stenosis PLV on cath.  No ischemic chest pain.   - Continue ASA 81 daily and Crestor 5 mg daily.   - Check lipids.  8. Pre-operative evaluation: NYHA class I-II symptoms, not volume overloaded on exam.  No chest pain.  I think that he can safely undergo back surgery at this point.  As above, I will be getting an echo.  9. Scrotal mass: I will order an ultrasound and have him followup with PCP.   Followup in 8/19 with echo.   Loralie Champagne, MD 06/01/19

## 2019-06-02 ENCOUNTER — Other Ambulatory Visit: Payer: Self-pay

## 2019-06-02 ENCOUNTER — Other Ambulatory Visit (HOSPITAL_COMMUNITY): Payer: Self-pay | Admitting: Cardiology

## 2019-06-02 ENCOUNTER — Ambulatory Visit (HOSPITAL_COMMUNITY)
Admission: RE | Admit: 2019-06-02 | Discharge: 2019-06-02 | Disposition: A | Discharge status: Home / Self Care | Payer: Medicare Other | Source: Ambulatory Visit | Attending: Cardiology | Admitting: Cardiology

## 2019-06-02 DIAGNOSIS — I110 Hypertensive heart disease with heart failure: Secondary | ICD-10-CM | POA: Diagnosis not present

## 2019-06-02 DIAGNOSIS — F17200 Nicotine dependence, unspecified, uncomplicated: Secondary | ICD-10-CM | POA: Insufficient documentation

## 2019-06-02 DIAGNOSIS — F1910 Other psychoactive substance abuse, uncomplicated: Secondary | ICD-10-CM | POA: Insufficient documentation

## 2019-06-02 DIAGNOSIS — I351 Nonrheumatic aortic (valve) insufficiency: Secondary | ICD-10-CM | POA: Insufficient documentation

## 2019-06-02 DIAGNOSIS — I429 Cardiomyopathy, unspecified: Secondary | ICD-10-CM | POA: Diagnosis not present

## 2019-06-02 DIAGNOSIS — I5022 Chronic systolic (congestive) heart failure: Secondary | ICD-10-CM | POA: Diagnosis present

## 2019-06-02 NOTE — Progress Notes (Signed)
  Echocardiogram 2D Echocardiogram has been performed.  Edward Mullen 06/02/2019, 10:59 AM

## 2019-06-03 NOTE — Anesthesia Preprocedure Evaluation (Addendum)
Anesthesia Evaluation  Patient identified by MRN, date of birth, ID band Patient awake    Reviewed: Allergy & Precautions, NPO status , Patient's Chart, lab work & pertinent test results  Airway Mallampati: II  TM Distance: >3 FB Neck ROM: Full    Dental  (+) Poor Dentition, Missing, Chipped, Dental Advisory Given   Pulmonary COPD,  COPD inhaler, Current Smoker and Patient abstained from smoking.,    breath sounds clear to auscultation + decreased breath sounds      Cardiovascular hypertension, Pt. 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   Abdominal   Peds  Hematology  (+) anemia ,   Anesthesia Other Findings   Reproductive/Obstetrics                          Anesthesia Physical Anesthesia Plan  ASA: III  Anesthesia Plan: General   Post-op Pain Management:    Induction: Intravenous  PONV Risk Score and Plan: 2 and Ondansetron, Treatment may vary due to age or medical condition, Midazolam and Dexamethasone  Airway Management Planned: Oral ETT  Additional Equipment: Arterial line  Intra-op Plan:   Post-operative Plan:   Informed Consent: I have reviewed the patients History and Physical, chart, labs and discussed the procedure including the risks, benefits and alternatives for the proposed anesthesia with the patient or authorized representative who has indicated his/her  understanding and acceptance.     Dental advisory given  Plan Discussed with: CRNA and Surgeon  Anesthesia Plan Comments: (Follows with cardiology for hx of NICM - HTN vs cocaine use vs viral vs ETOH.  EF 15-20% with moderate to severe MR on echo 1/18, now recovered to 55-60% with no significant valvular abnormalities by Echo 06/02/19. Last seen by Dr. Aundra Dubin 05/31/19 for preop clearance. Per note "Pre-operative evaluation: NYHA class I-II symptoms, not volume overloaded on exam.  No chest pain.  I think that he can safely undergo back surgery at this point.  As above, I will be getting an echo."  TTE 06/02/19: 1. Left ventricular ejection fraction, by visual estimation, is 55 to 60%. The left ventricle has normal function. There is moderately increased left ventricular hypertrophy.  2. Left ventricular diastolic Doppler parameters are consistent with impaired relaxation pattern of LV diastolic filling.  3. Global right ventricle has normal systolic function.The right ventricular size is normal. No increase in right ventricular wall thickness.  4. Left atrial size was normal.  5. Right atrial size was normal.  6. The mitral valve is normal in structure. No evidence of mitral valve regurgitation.  7. The tricuspid valve is normal in structure. Tricuspid valve regurgitation was not visualized by color flow Doppler.  8. The aortic valve is normal in structure. Aortic valve regurgitation is mild by color flow Doppler.  9. The pulmonic valve was not well visualized. Pulmonic valve regurgitation is not visualized by color flow Doppler. 10. Aortic dilatation noted. 11. 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 12.  TR signal is inadequate for assessing pulmonary artery systolic pressure. 13. The inferior vena cava is normal in size with greater than 50% respiratory variability, suggesting right atrial pressure of 3 mmHg. 14. The average left ventricular global longitudinal  strain is -15.4 %, which is mildly decreased.    )      Anesthesia Quick Evaluation

## 2019-06-03 NOTE — Progress Notes (Signed)
Anesthesia Chart Review: Follows with cardiology for hx of NICM - HTN vs cocaine use vs viral vs ETOH.  EF 15-20% with moderate to severe MR on echo 1/18, now recovered to 55-60% with no significant valvular abnormalities by Echo 06/02/19. Last seen by Dr. Aundra Dubin 05/31/19 for preop clearance. Per note "Pre-operative evaluation: NYHA class I-II symptoms, not volume overloaded on exam.  No chest pain.  I think that he can safely undergo back surgery at this point.  As above, I will be getting an echo."  TTE 06/02/19: 1. Left ventricular ejection fraction, by visual estimation, is 55 to 60%. The left ventricle has normal function. There is moderately increased left ventricular hypertrophy.  2. Left ventricular diastolic Doppler parameters are consistent with impaired relaxation pattern of LV diastolic filling.  3. Global right ventricle has normal systolic function.The right ventricular size is normal. No increase in right ventricular wall thickness.  4. Left atrial size was normal.  5. Right atrial size was normal.  6. The mitral valve is normal in structure. No evidence of mitral valve regurgitation.  7. The tricuspid valve is normal in structure. Tricuspid valve regurgitation was not visualized by color flow Doppler.  8. The aortic valve is normal in structure. Aortic valve regurgitation is mild by color flow Doppler.  9. The pulmonic valve was not well visualized. Pulmonic valve regurgitation is not visualized by color flow Doppler. 10. Aortic dilatation noted. 11. 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 12. TR signal is inadequate for assessing pulmonary artery systolic pressure. 13. The inferior vena cava is normal in size with greater than 50% respiratory variability, suggesting right atrial pressure of 3 mmHg. 14. The average left ventricular global longitudinal strain is -15.4 %, which is mildly decreased.    Wynonia Musty [facility] Short Stay  Center/Anesthesiology Phone [phone] 06/03/2019 11:46 AM

## 2019-06-04 ENCOUNTER — Telehealth (HOSPITAL_COMMUNITY): Payer: Self-pay

## 2019-06-04 ENCOUNTER — Other Ambulatory Visit: Payer: Self-pay

## 2019-06-04 ENCOUNTER — Ambulatory Visit (HOSPITAL_COMMUNITY)
Admission: RE | Admit: 2019-06-04 | Discharge: 2019-06-04 | Disposition: A | Discharge status: Home / Self Care | Payer: Medicare Other | Source: Ambulatory Visit | Attending: Cardiology | Admitting: Cardiology

## 2019-06-04 DIAGNOSIS — N5089 Other specified disorders of the male genital organs: Secondary | ICD-10-CM | POA: Diagnosis present

## 2019-06-04 NOTE — Telephone Encounter (Signed)
Pt aware of results of echo, verbalized understanding

## 2019-06-04 NOTE — Telephone Encounter (Signed)
-----   Message from Larey Dresser, MD sent at 06/03/2019 10:52 PM EDT ----- EF normal with moderate LVH.

## 2019-06-07 ENCOUNTER — Encounter (HOSPITAL_COMMUNITY): Payer: Self-pay | Admitting: Anesthesiology

## 2019-06-07 NOTE — H&P (Addendum)
Chief Complaint   Back pain  HPI   HPI: Edward Mullen is a 63 y.o. male With a several year history of lower back and bilateral leg pain.   Pain has been gradually and progressively worsening.  Pain is exacerbated by walking.  Rest does help reduce the pain although does not resolve it.  An MRI of his lumbar spine revealed severe multifactorial stenosis at L3-4, L4-5 and L5-S1.  He has failed reasonable conservative treatment including p.o. medications, physical therapy and epidural injections.  He presents today for lumbar decompression and fusion.  He is without any concerns.    Of note, patient has a history of heart failure.  Please see note by Dr. Aundra Dubin dated 05/31/2019 for cardiac clearance.  Patient Active Problem List   Diagnosis Date Noted  . Healthcare maintenance 05/11/2019  . Housing problems 05/11/2019  . Difficulty urinating 04/07/2018  . Colon cancer screening 04/07/2018  . Anemia 12/19/2016  . CKD (chronic kidney disease) stage 3, GFR 30-59 ml/min 12/13/2016  . Chronic systolic heart failure ([place]) 09/06/2016  . Non-ischemic cardiomyopathy ([place]) 08/27/2016  . 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 Mitral regurgitation 08/27/2016  . Polysubstance abuse ([place])   . Tobacco abuse 09/19/2014  . HTN (hypertension) 03/28/2011  . Bilateral lower extremity pain 03/28/2011    PMH: Past Medical History:  Diagnosis Date  . Arthritis    "feels like it in my legs" (09/20/2014)  . Borderline type 2 diabetes mellitus    patient denies  . CHF (congestive heart failure) ([place])   . Dysrhythmia   . Hypertension   . NSVT (nonsustained ventricular tachycardia) ([place]) 09/06/2016    PSH: Past Surgical History:  Procedure Laterality Date  . CARDIAC CATHETERIZATION N/A 08/27/2016   Procedure: Right/Left Heart Cath and Coronary Angiography;  Surgeon: Larey Dresser, MD;  Location: [facility];  Service: Cardiovascular;  Laterality: N/A;  .  COLONOSCOPY    . CYSTECTOMY Right    "back of my shoulder"  . TONSILLECTOMY      No medications prior to admission.    SH: Social History   Tobacco Use  . Smoking status: Current Every Day Smoker    Packs/day: 0.10    Years: 48.00    Pack years: 4.80    Types: Cigarettes  . Smokeless tobacco: Never Used  . Tobacco comment: cutting back 2  per day  Substance Use Topics  . Alcohol use: Yes    Alcohol/week: 1.0 standard drinks    Types: 1 Cans of beer per week    Comment: occasionally  . Drug use: Yes    Types: Marijuana    Comment: daily 4 times last 2 weeks ago as of 05/31/2019    MEDS: Prior to Admission medications   Medication Sig Start Date End Date Taking? Authorizing Provider  ENTRESTO 49-51 MG TAKE ONE TABLET BY MOUTH TWICE DAILY 04/12/19  Yes Larey Dresser, MD  eplerenone (INSPRA) 25 MG tablet TAKE ONE TABLET BY MOUTH ONCE DAILY 04/12/19  Yes Larey Dresser, MD  gabapentin (NEURONTIN) 300 MG capsule TAKE 2 CAPSULES (600 MG TOTAL) BY MOUTH 2 (TWO) TIMES DAILY. 01/19/19  Yes Dorrell, Andree Elk, MD  rosuvastatin (CRESTOR) 5 MG tablet TAKE ONE TABLET BY MOUTH ONCE DAILY 04/12/19  Yes Larey Dresser, MD  BIDIL 20-37.5 MG tablet TAKE 2 TABLETS BY MOUTH 3 (THREE) TIMES A DAY 06/02/19   Larey Dresser, MD  furosemide (LASIX) 20 MG tablet TAKE ONE  TABLET BY MOUTH ONCE DAILY 06/02/19   Larey Dresser, MD  pregabalin (LYRICA) 75 MG capsule Take 1 capsule (75 mg total) by mouth 2 (two) times daily. 03/09/19   Harvie Heck, MD    ALLERGY: No Known Allergies  Social History   Tobacco Use  . Smoking status: Current Every Day Smoker    Packs/day: 0.10    Years: 48.00    Pack years: 4.80    Types: Cigarettes  . Smokeless tobacco: Never Used  . Tobacco comment: cutting back 2  per day  Substance Use Topics  . Alcohol use: Yes    Alcohol/week: 1.0 standard drinks    Types: 1 Cans of beer per week    Comment: occasionally     Family History  Problem Relation Age  of Onset  . Stroke Mother 47  . Heart disease Father 54  . Colon cancer Neg Hx   . Colon polyps Neg Hx   . Esophageal cancer Neg Hx   . Stomach cancer Neg Hx   . Rectal cancer Neg Hx      ROS   ROS  Exam   There were no vitals filed for this visit. General appearance: WDWN, NAD Eyes: No scleral injection Cardiovascular: Regular rate and rhythm without murmurs, rubs, gallops. No edema or variciosities. Distal pulses normal. Pulmonary: Effort normal, non-labored breathing Musculoskeletal:     Muscle tone upper extremities: Normal    Muscle tone lower extremities: Normal    Motor exam: Upper Extremities Deltoid Bicep Tricep Grip  Right 5/5 5/5 5/5 5/5  Left 5/5 5/5 5/5 5/5   Lower Extremity IP Quad PF DF EHL  Right 5/5 5/5 5/5 5/5 5/5  Left 5/5 5/5 5/5 5/5 5/5   Neurological Mental Status:    - Patient is awake, alert, oriented to person, place, month, year, and situation    - Patient is able to give a clear and coherent history.    - No signs of aphasia or neglect Cranial Nerves    - II: Visual Fields are full. PERRL    - III/IV/VI: EOMI without ptosis or diploplia.     - V: Facial sensation is grossly normal    - VII: Facial movement is symmetric.     - VIII: hearing is intact to voice    - X: Uvula elevates symmetrically    - XI: Shoulder shrug is symmetric.    - XII: tongue is midline without atrophy or fasciculations.  Sensory: Sensation grossly intact to LT  Results - Imaging/Labs   No results found for this or any previous visit (from the past 48 hour(s)).  No results found.  IMAGING: MRI of the lumbar spine dated 04/21/2019 was reviewed.  This demonstrates severe multifactorial stenosis at L3-4, L4-5, and L5-S1.  At all 3 levels there is significant facet arthropathy and ligamentous hypertrophy.  In addition, at L5-S1 there is also disc desiccation with loss of height.  There is associated reactive endplate changes at L5 and S1.  Impression/Plan   63  y.o. 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  He has had progressive symptoms despite multiple conservative treatments including medication, physical therapy, and injection therapy.  His pain continues to hinder normal daily activities.  We will proceed with decompression fusion at L3-4, L4-5 and L5- S1 including interbody arthrodesis, interbody cages and posterior segmental instrumentation from L3-S1.  While in the office risks, benefits alternatives were discussed.  Patient stated understanding and wished to proceed.    Of note, patient has a history of heart failure.  Please see note by Dr. Aundra Dubin dated 05/31/2019 for cardiac clearance.  We will monitor volume status preoperatively.  Ferne Reus, PA-C [facility] [employer]

## 2019-06-08 ENCOUNTER — Encounter (HOSPITAL_COMMUNITY): Payer: Self-pay

## 2019-06-08 ENCOUNTER — Inpatient Hospital Stay (HOSPITAL_COMMUNITY)
Admission: RE | Admit: 2019-06-08 | Discharge: 2019-06-10 | DRG: 460 | Disposition: A | Discharge status: Home Health | Payer: Medicare Other | Source: Ambulatory Visit | Attending: Neurosurgery | Admitting: Neurosurgery

## 2019-06-08 ENCOUNTER — Inpatient Hospital Stay (HOSPITAL_COMMUNITY): Payer: Medicare Other | Admitting: Anesthesiology

## 2019-06-08 ENCOUNTER — Inpatient Hospital Stay (HOSPITAL_COMMUNITY): Payer: Medicare Other | Admitting: Physician Assistant

## 2019-06-08 ENCOUNTER — Telehealth (HOSPITAL_COMMUNITY): Payer: Self-pay

## 2019-06-08 ENCOUNTER — Inpatient Hospital Stay (HOSPITAL_COMMUNITY): Payer: Medicare Other

## 2019-06-08 ENCOUNTER — Encounter (HOSPITAL_COMMUNITY)
Admission: RE | Disposition: A | Discharge status: Home Health | Payer: Self-pay | Source: Ambulatory Visit | Attending: Neurosurgery

## 2019-06-08 ENCOUNTER — Other Ambulatory Visit: Payer: Self-pay

## 2019-06-08 DIAGNOSIS — R55 Syncope and collapse: Secondary | ICD-10-CM | POA: Diagnosis present

## 2019-06-08 DIAGNOSIS — I130 Hypertensive heart and chronic kidney disease with heart failure and stage 1 through stage 4 chronic kidney disease, or unspecified chronic kidney disease: Secondary | ICD-10-CM | POA: Diagnosis present

## 2019-06-08 DIAGNOSIS — Z20828 Contact with and (suspected) exposure to other viral communicable diseases: Secondary | ICD-10-CM | POA: Diagnosis present

## 2019-06-08 DIAGNOSIS — Z9889 Other specified postprocedural states: Secondary | ICD-10-CM | POA: Diagnosis not present

## 2019-06-08 DIAGNOSIS — M47817 Spondylosis without myelopathy or radiculopathy, lumbosacral region: Secondary | ICD-10-CM | POA: Diagnosis present

## 2019-06-08 DIAGNOSIS — R7303 Prediabetes: Secondary | ICD-10-CM | POA: Diagnosis present

## 2019-06-08 DIAGNOSIS — N183 Chronic kidney disease, stage 3 unspecified: Secondary | ICD-10-CM | POA: Diagnosis present

## 2019-06-08 DIAGNOSIS — Z79899 Other long term (current) drug therapy: Secondary | ICD-10-CM

## 2019-06-08 DIAGNOSIS — I5022 Chronic systolic (congestive) heart failure: Secondary | ICD-10-CM | POA: Diagnosis present

## 2019-06-08 DIAGNOSIS — M48062 Spinal stenosis, lumbar region with neurogenic claudication: Principal | ICD-10-CM | POA: Diagnosis present

## 2019-06-08 DIAGNOSIS — E1122 Type 2 diabetes mellitus with diabetic chronic kidney disease: Secondary | ICD-10-CM | POA: Diagnosis not present

## 2019-06-08 DIAGNOSIS — Z01812 Encounter for preprocedural laboratory examination: Secondary | ICD-10-CM | POA: Diagnosis not present

## 2019-06-08 DIAGNOSIS — Z8249 Family history of ischemic heart disease and other diseases of the circulatory system: Secondary | ICD-10-CM

## 2019-06-08 DIAGNOSIS — Z599 Problem related to housing and economic circumstances, unspecified: Secondary | ICD-10-CM

## 2019-06-08 DIAGNOSIS — M47816 Spondylosis without myelopathy or radiculopathy, lumbar region: Secondary | ICD-10-CM | POA: Diagnosis present

## 2019-06-08 DIAGNOSIS — F17210 Nicotine dependence, cigarettes, uncomplicated: Secondary | ICD-10-CM | POA: Diagnosis present

## 2019-06-08 DIAGNOSIS — I428 Other cardiomyopathies: Secondary | ICD-10-CM | POA: Diagnosis present

## 2019-06-08 DIAGNOSIS — M4807 Spinal stenosis, lumbosacral region: Secondary | ICD-10-CM | POA: Diagnosis present

## 2019-06-08 DIAGNOSIS — M79605 Pain in left leg: Secondary | ICD-10-CM | POA: Diagnosis present

## 2019-06-08 DIAGNOSIS — I472 Ventricular tachycardia: Secondary | ICD-10-CM | POA: Diagnosis present

## 2019-06-08 DIAGNOSIS — M48061 Spinal stenosis, lumbar region without neurogenic claudication: Secondary | ICD-10-CM | POA: Diagnosis present

## 2019-06-08 DIAGNOSIS — Z419 Encounter for procedure for purposes other than remedying health state, unspecified: Secondary | ICD-10-CM

## 2019-06-08 LAB — POCT I-STAT 7, (LYTES, BLD GAS, ICA,H+H)
Acid-base deficit: 3 mmol/L — ABNORMAL HIGH (ref 0.0–2.0)
Acid-base deficit: 2 mmol/L (ref 0.0–2.0)
Bicarbonate: 23.3 mmol/L (ref 20.0–28.0)
Bicarbonate: 23.7 mmol/L (ref 20.0–28.0)
Calcium, Ion: 1.22 mmol/L (ref 1.15–1.40)
Calcium, Ion: 1.22 mmol/L (ref 1.15–1.40)
HCT: 36 % — ABNORMAL LOW (ref 39.0–52.0)
HCT: 35 % — ABNORMAL LOW (ref 39.0–52.0)
Hemoglobin: 12.2 g/dL — ABNORMAL LOW (ref 13.0–17.0)
Hemoglobin: 11.9 g/dL — ABNORMAL LOW (ref 13.0–17.0)
O2 Saturation: 100 %
O2 Saturation: 100 %
Patient temperature: 35.6
Patient temperature: 36.8
Potassium: 4 mmol/L (ref 3.5–5.1)
Potassium: 4.3 mmol/L (ref 3.5–5.1)
Sodium: 142 mmol/L (ref 135–145)
Sodium: 141 mmol/L (ref 135–145)
TCO2: 25 mmol/L (ref 22–32)
TCO2: 25 mmol/L (ref 22–32)
pCO2 arterial: 41.1 mmHg (ref 32.0–48.0)
pCO2 arterial: 42.8 mmHg (ref 32.0–48.0)
pH, Arterial: 7.355 (ref 7.350–7.450)
pH, Arterial: 7.35 (ref 7.350–7.450)
pO2, Arterial: 245 mmHg — ABNORMAL HIGH (ref 83.0–108.0)
pO2, Arterial: 234 mmHg — ABNORMAL HIGH (ref 83.0–108.0)

## 2019-06-08 LAB — GLUCOSE, CAPILLARY
Glucose-Capillary: 82 mg/dL (ref 70–99)
Glucose-Capillary: 111 mg/dL — ABNORMAL HIGH (ref 70–99)

## 2019-06-08 LAB — SARS CORONAVIRUS 2 BY RT PCR (HOSPITAL ORDER, PERFORMED IN ~~LOC~~ HOSPITAL LAB): SARS Coronavirus 2: NEGATIVE

## 2019-06-08 SURGERY — POSTERIOR LUMBAR FUSION 3 LEVEL
Anesthesia: General

## 2019-06-08 MED ORDER — ONDANSETRON HCL 4 MG PO TABS: 4.0000 mg | ORAL_TABLET | Freq: Four times a day (QID) | ORAL | Status: DC | PRN

## 2019-06-08 MED ORDER — LIDOCAINE-EPINEPHRINE 1 %-1:100000 IJ SOLN
INTRAMUSCULAR | Status: AC
  Filled 2019-06-08: qty 1

## 2019-06-08 MED ORDER — PROPOFOL 10 MG/ML IV BOLUS
INTRAVENOUS | Status: DC | PRN
  Administered 2019-06-08: 140 mg via INTRAVENOUS
  Administered 2019-06-08: 10 mg via INTRAVENOUS

## 2019-06-08 MED ORDER — FLEET ENEMA 7-19 GM/118ML RE ENEM: 1.0000 | ENEMA | Freq: Once | RECTAL | Status: DC | PRN

## 2019-06-08 MED ORDER — ALBUMIN HUMAN 5 % IV SOLN
INTRAVENOUS | Status: DC | PRN
  Administered 2019-06-08 (×2): via INTRAVENOUS

## 2019-06-08 MED ORDER — MIDAZOLAM HCL 2 MG/2ML IJ SOLN
INTRAMUSCULAR | Status: AC
  Filled 2019-06-08: qty 2

## 2019-06-08 MED ORDER — ONDANSETRON HCL 4 MG/2ML IJ SOLN
INTRAMUSCULAR | Status: AC
  Filled 2019-06-08: qty 2

## 2019-06-08 MED ORDER — MENTHOL 3 MG MT LOZG: 1.0000 | LOZENGE | OROMUCOSAL | Status: DC | PRN

## 2019-06-08 MED ORDER — HYDROMORPHONE HCL 1 MG/ML IJ SOLN
0.2500 mg | INTRAMUSCULAR | Status: DC | PRN
  Administered 2019-06-08 (×2): 0.5 mg via INTRAVENOUS

## 2019-06-08 MED ORDER — ONDANSETRON HCL 4 MG/2ML IJ SOLN
4.0000 mg | Freq: Four times a day (QID) | INTRAMUSCULAR | Status: DC | PRN
  Administered 2019-06-09: 4 mg via INTRAVENOUS
  Filled 2019-06-08: qty 2

## 2019-06-08 MED ORDER — BUPIVACAINE HCL (PF) 0.5 % IJ SOLN
INTRAMUSCULAR | Status: DC | PRN
  Administered 2019-06-08: 10 mL

## 2019-06-08 MED ORDER — METHOCARBAMOL 1000 MG/10ML IJ SOLN
500.0000 mg | Freq: Four times a day (QID) | INTRAVENOUS | Status: DC | PRN
  Filled 2019-06-08: qty 5

## 2019-06-08 MED ORDER — 0.9 % SODIUM CHLORIDE (POUR BTL) OPTIME
TOPICAL | Status: DC | PRN
  Administered 2019-06-08: 1000 mL

## 2019-06-08 MED ORDER — GABAPENTIN 300 MG PO CAPS
600.0000 mg | ORAL_CAPSULE | Freq: Two times a day (BID) | ORAL | Status: DC
  Administered 2019-06-08 – 2019-06-10 (×4): 600 mg via ORAL
  Filled 2019-06-08 (×4): qty 2

## 2019-06-08 MED ORDER — CEFAZOLIN SODIUM-DEXTROSE 2-4 GM/100ML-% IV SOLN
2.0000 g | INTRAVENOUS | Status: AC
  Administered 2019-06-08 (×2): 2 g via INTRAVENOUS
  Filled 2019-06-08: qty 100

## 2019-06-08 MED ORDER — BUPIVACAINE HCL (PF) 0.5 % IJ SOLN
INTRAMUSCULAR | Status: AC
  Filled 2019-06-08: qty 30

## 2019-06-08 MED ORDER — THROMBIN 5000 UNITS EX SOLR
CUTANEOUS | Status: AC
  Filled 2019-06-08: qty 5000

## 2019-06-08 MED ORDER — MIDAZOLAM HCL 2 MG/2ML IJ SOLN
INTRAMUSCULAR | Status: DC | PRN
  Administered 2019-06-08: 2 mg via INTRAVENOUS

## 2019-06-08 MED ORDER — ISOSORB DINITRATE-HYDRALAZINE 20-37.5 MG PO TABS
2.0000 | ORAL_TABLET | Freq: Three times a day (TID) | ORAL | Status: DC
  Administered 2019-06-08 – 2019-06-10 (×5): 2 via ORAL
  Filled 2019-06-08 (×7): qty 2

## 2019-06-08 MED ORDER — SODIUM CHLORIDE 0.9% FLUSH
3.0000 mL | INTRAVENOUS | Status: DC | PRN
  Administered 2019-06-08: 3 mL via INTRAVENOUS
  Filled 2019-06-08: qty 3

## 2019-06-08 MED ORDER — PREGABALIN 50 MG PO CAPS: 75.0000 mg | ORAL_CAPSULE | Freq: Two times a day (BID) | ORAL | Status: DC

## 2019-06-08 MED ORDER — SODIUM CHLORIDE 0.9 % IV SOLN: 250.0000 mL | INTRAVENOUS | Status: DC

## 2019-06-08 MED ORDER — HYDROMORPHONE HCL 1 MG/ML IJ SOLN
0.5000 mg | INTRAMUSCULAR | Status: DC | PRN
  Administered 2019-06-08: 1 mg via INTRAVENOUS
  Filled 2019-06-08: qty 1

## 2019-06-08 MED ORDER — HYDROMORPHONE HCL 1 MG/ML IJ SOLN
INTRAMUSCULAR | Status: AC
  Administered 2019-06-08: 17:00:00
  Filled 2019-06-08: qty 1

## 2019-06-08 MED ORDER — ONDANSETRON HCL 4 MG/2ML IJ SOLN
INTRAMUSCULAR | Status: DC | PRN
  Administered 2019-06-08: 4 mg via INTRAVENOUS

## 2019-06-08 MED ORDER — SENNOSIDES-DOCUSATE SODIUM 8.6-50 MG PO TABS: 1.0000 | ORAL_TABLET | Freq: Every evening | ORAL | Status: DC | PRN

## 2019-06-08 MED ORDER — FUROSEMIDE 20 MG PO TABS
20.0000 mg | ORAL_TABLET | Freq: Every day | ORAL | Status: DC
  Administered 2019-06-09 – 2019-06-10 (×2): 20 mg via ORAL
  Filled 2019-06-08 (×2): qty 1

## 2019-06-08 MED ORDER — PHENYLEPHRINE HCL (PRESSORS) 10 MG/ML IV SOLN
INTRAVENOUS | Status: AC
  Filled 2019-06-08: qty 1

## 2019-06-08 MED ORDER — ACETAMINOPHEN 500 MG PO TABS
1000.0000 mg | ORAL_TABLET | Freq: Four times a day (QID) | ORAL | Status: AC
  Administered 2019-06-08 – 2019-06-09 (×3): 1000 mg via ORAL
  Filled 2019-06-08 (×3): qty 2

## 2019-06-08 MED ORDER — GLYCOPYRROLATE PF 0.2 MG/ML IJ SOSY
PREFILLED_SYRINGE | INTRAMUSCULAR | Status: AC
  Filled 2019-06-08: qty 1

## 2019-06-08 MED ORDER — ROCURONIUM BROMIDE 10 MG/ML (PF) SYRINGE
PREFILLED_SYRINGE | INTRAVENOUS | Status: AC
  Filled 2019-06-08: qty 10

## 2019-06-08 MED ORDER — BISACODYL 10 MG RE SUPP: 10.0000 mg | Freq: Every day | RECTAL | Status: DC | PRN

## 2019-06-08 MED ORDER — PROMETHAZINE HCL 25 MG/ML IJ SOLN: 6.2500 mg | INTRAMUSCULAR | Status: DC | PRN

## 2019-06-08 MED ORDER — PHENYLEPHRINE 40 MCG/ML (10ML) SYRINGE FOR IV PUSH (FOR BLOOD PRESSURE SUPPORT)
PREFILLED_SYRINGE | INTRAVENOUS | Status: DC | PRN
  Administered 2019-06-08 (×2): 80 ug via INTRAVENOUS

## 2019-06-08 MED ORDER — LIDOCAINE-EPINEPHRINE 1 %-1:100000 IJ SOLN
INTRAMUSCULAR | Status: DC | PRN
  Administered 2019-06-08: 10 mL

## 2019-06-08 MED ORDER — ACETAMINOPHEN 650 MG RE SUPP: 650.0000 mg | RECTAL | Status: DC | PRN

## 2019-06-08 MED ORDER — DEXAMETHASONE SODIUM PHOSPHATE 10 MG/ML IJ SOLN
INTRAMUSCULAR | Status: AC
  Filled 2019-06-08: qty 1

## 2019-06-08 MED ORDER — CHLORHEXIDINE GLUCONATE CLOTH 2 % EX PADS: 6.0000 | MEDICATED_PAD | Freq: Once | CUTANEOUS | Status: DC

## 2019-06-08 MED ORDER — SPIRONOLACTONE 25 MG PO TABS
25.0000 mg | ORAL_TABLET | Freq: Every day | ORAL | Status: DC
  Administered 2019-06-08 – 2019-06-10 (×3): 25 mg via ORAL
  Filled 2019-06-08 (×3): qty 1

## 2019-06-08 MED ORDER — ACETAMINOPHEN 325 MG PO TABS: 650.0000 mg | ORAL_TABLET | ORAL | Status: DC | PRN

## 2019-06-08 MED ORDER — SENNA 8.6 MG PO TABS
1.0000 | ORAL_TABLET | Freq: Two times a day (BID) | ORAL | Status: DC
  Administered 2019-06-08 – 2019-06-10 (×4): 8.6 mg via ORAL
  Filled 2019-06-08 (×4): qty 1

## 2019-06-08 MED ORDER — DEXAMETHASONE SODIUM PHOSPHATE 10 MG/ML IJ SOLN
INTRAMUSCULAR | Status: DC | PRN
  Administered 2019-06-08: 10 mg via INTRAVENOUS

## 2019-06-08 MED ORDER — SODIUM CHLORIDE 0.9 % IV SOLN
INTRAVENOUS | Status: DC | PRN
  Administered 2019-06-08: 500 mL

## 2019-06-08 MED ORDER — LIDOCAINE 2% (20 MG/ML) 5 ML SYRINGE
INTRAMUSCULAR | Status: DC | PRN
  Administered 2019-06-08: 80 mg via INTRAVENOUS
  Administered 2019-06-08: 20 mg via INTRAVENOUS

## 2019-06-08 MED ORDER — CEFAZOLIN SODIUM-DEXTROSE 2-4 GM/100ML-% IV SOLN
2.0000 g | Freq: Three times a day (TID) | INTRAVENOUS | Status: AC
  Administered 2019-06-08 – 2019-06-09 (×2): 2 g via INTRAVENOUS
  Filled 2019-06-08 (×2): qty 100

## 2019-06-08 MED ORDER — SODIUM CHLORIDE 0.9% FLUSH
3.0000 mL | Freq: Two times a day (BID) | INTRAVENOUS | Status: DC
  Administered 2019-06-08 – 2019-06-10 (×4): 3 mL via INTRAVENOUS

## 2019-06-08 MED ORDER — PHENOL 1.4 % MT LIQD: 1.0000 | OROMUCOSAL | Status: DC | PRN

## 2019-06-08 MED ORDER — SODIUM CHLORIDE 0.9 % IV SOLN: INTRAVENOUS | Status: DC

## 2019-06-08 MED ORDER — SODIUM CHLORIDE 0.9 % IV SOLN
INTRAVENOUS | Status: DC | PRN
  Administered 2019-06-08: 25 ug/min via INTRAVENOUS

## 2019-06-08 MED ORDER — OXYCODONE HCL 5 MG PO TABS
5.0000 mg | ORAL_TABLET | ORAL | Status: DC | PRN
  Administered 2019-06-09 – 2019-06-10 (×6): 10 mg via ORAL
  Filled 2019-06-08 (×6): qty 2

## 2019-06-08 MED ORDER — CEFAZOLIN SODIUM 1 G IJ SOLR
INTRAMUSCULAR | Status: AC
  Filled 2019-06-08: qty 20

## 2019-06-08 MED ORDER — HYDROCODONE-ACETAMINOPHEN 5-325 MG PO TABS
1.0000 | ORAL_TABLET | ORAL | Status: DC | PRN
  Administered 2019-06-09 – 2019-06-10 (×2): 1 via ORAL
  Filled 2019-06-08 (×2): qty 1

## 2019-06-08 MED ORDER — HEMOSTATIC AGENTS (NO CHARGE) OPTIME
TOPICAL | Status: DC | PRN
  Administered 2019-06-08: 1 via TOPICAL

## 2019-06-08 MED ORDER — LACTATED RINGERS IV SOLN
INTRAVENOUS | Status: DC | PRN
  Administered 2019-06-08: 09:00:00 via INTRAVENOUS

## 2019-06-08 MED ORDER — MEPERIDINE HCL 25 MG/ML IJ SOLN: 6.2500 mg | INTRAMUSCULAR | Status: DC | PRN

## 2019-06-08 MED ORDER — PROPOFOL 10 MG/ML IV BOLUS
INTRAVENOUS | Status: AC
  Filled 2019-06-08: qty 40

## 2019-06-08 MED ORDER — THROMBIN 5000 UNITS EX SOLR
OROMUCOSAL | Status: DC | PRN
  Administered 2019-06-08 (×4): 5 mL via TOPICAL

## 2019-06-08 MED ORDER — FENTANYL CITRATE (PF) 250 MCG/5ML IJ SOLN
INTRAMUSCULAR | Status: DC | PRN
  Administered 2019-06-08: 100 ug via INTRAVENOUS

## 2019-06-08 MED ORDER — METHOCARBAMOL 500 MG PO TABS
500.0000 mg | ORAL_TABLET | Freq: Four times a day (QID) | ORAL | Status: DC | PRN
  Administered 2019-06-08 – 2019-06-09 (×2): 500 mg via ORAL
  Filled 2019-06-08 (×2): qty 1

## 2019-06-08 MED ORDER — ROCURONIUM BROMIDE 10 MG/ML (PF) SYRINGE
PREFILLED_SYRINGE | INTRAVENOUS | Status: DC | PRN
  Administered 2019-06-08: 50 mg via INTRAVENOUS
  Administered 2019-06-08: 100 mg via INTRAVENOUS
  Administered 2019-06-08: 20 mg via INTRAVENOUS

## 2019-06-08 MED ORDER — DOCUSATE SODIUM 100 MG PO CAPS
100.0000 mg | ORAL_CAPSULE | Freq: Two times a day (BID) | ORAL | Status: DC
  Administered 2019-06-08 – 2019-06-10 (×4): 100 mg via ORAL
  Filled 2019-06-08 (×4): qty 1

## 2019-06-08 MED ORDER — ROSUVASTATIN CALCIUM 5 MG PO TABS
5.0000 mg | ORAL_TABLET | Freq: Every day | ORAL | Status: DC
  Administered 2019-06-09 – 2019-06-10 (×2): 5 mg via ORAL
  Filled 2019-06-08 (×2): qty 1

## 2019-06-08 MED ORDER — SUFENTANIL CITRATE 250 MCG/5ML IV SOLN
0.2500 ug/kg/h | INTRAVENOUS | Status: AC
  Administered 2019-06-08: .2 ug/kg/h via INTRAVENOUS
  Filled 2019-06-08 (×2): qty 5

## 2019-06-08 MED ORDER — ZOLPIDEM TARTRATE 5 MG PO TABS
5.0000 mg | ORAL_TABLET | Freq: Every evening | ORAL | Status: DC | PRN
  Administered 2019-06-09: 5 mg via ORAL
  Filled 2019-06-08: qty 1

## 2019-06-08 MED ORDER — LIDOCAINE 2% (20 MG/ML) 5 ML SYRINGE
INTRAMUSCULAR | Status: AC
  Filled 2019-06-08: qty 5

## 2019-06-08 MED ORDER — SACUBITRIL-VALSARTAN 49-51 MG PO TABS
1.0000 | ORAL_TABLET | Freq: Two times a day (BID) | ORAL | Status: DC
  Administered 2019-06-08 – 2019-06-10 (×4): 1 via ORAL
  Filled 2019-06-08 (×5): qty 1

## 2019-06-08 MED ORDER — FENTANYL CITRATE (PF) 250 MCG/5ML IJ SOLN
INTRAMUSCULAR | Status: AC
  Filled 2019-06-08: qty 5

## 2019-06-08 MED ORDER — SUGAMMADEX SODIUM 200 MG/2ML IV SOLN
INTRAVENOUS | Status: DC | PRN
  Administered 2019-06-08: 174.2 mg via INTRAVENOUS

## 2019-06-08 MED ORDER — LACTATED RINGERS IV SOLN
INTRAVENOUS | Status: DC | PRN
  Administered 2019-06-08 (×3): via INTRAVENOUS

## 2019-06-08 SURGICAL SUPPLY — 84 items
ADH SKN CLS APL DERMABOND .7 (GAUZE/BANDAGES/DRESSINGS) ×1
APL SKNCLS STERI-STRIP NONHPOA (GAUZE/BANDAGES/DRESSINGS)
BAG DECANTER FOR FLEXI CONT (MISCELLANEOUS) ×2 IMPLANT
BASKET BONE COLLECTION (BASKET) ×2 IMPLANT
BENZOIN TINCTURE PRP APPL 2/3 (GAUZE/BANDAGES/DRESSINGS) IMPLANT
BIT DRILL 3.5 POWEREASE (BIT) ×1 IMPLANT
BLADE CLIPPER SURG (BLADE) IMPLANT
BLADE SURG 11 STRL SS (BLADE) ×2 IMPLANT
BUR MATCHSTICK NEURO 3.0 LAGG (BURR) ×2 IMPLANT
BUR PRECISION FLUTE 5.0 (BURR) ×3 IMPLANT
CANISTER SUCT 3000ML PPV (MISCELLANEOUS) ×2 IMPLANT
CARTRIDGE OIL MAESTRO DRILL (MISCELLANEOUS) ×1 IMPLANT
CONT SPEC 4OZ CLIKSEAL STRL BL (MISCELLANEOUS) ×2 IMPLANT
COVER BACK TABLE 60X90IN (DRAPES) ×2 IMPLANT
COVER WAND RF STERILE (DRAPES) ×2 IMPLANT
DECANTER SPIKE VIAL GLASS SM (MISCELLANEOUS) ×2 IMPLANT
DERMABOND ADVANCED (GAUZE/BANDAGES/DRESSINGS) ×1
DERMABOND ADVANCED .7 DNX12 (GAUZE/BANDAGES/DRESSINGS) ×1 IMPLANT
DEVICE INTERBODY ELEVATE 23X7 (Cage) ×4 IMPLANT
DEVICE INTERBODY ELEVATE 23X8 (Cage) ×4 IMPLANT
DIFFUSER DRILL AIR PNEUMATIC (MISCELLANEOUS) ×2 IMPLANT
DRAPE C-ARM 42X72 X-RAY (DRAPES) ×2 IMPLANT
DRAPE C-ARMOR (DRAPES) ×2 IMPLANT
DRAPE LAPAROTOMY 100X72X124 (DRAPES) ×2 IMPLANT
DRAPE SURG 17X23 STRL (DRAPES) ×2 IMPLANT
DRSG OPSITE POSTOP 4X8 (GAUZE/BANDAGES/DRESSINGS) ×1 IMPLANT
DURAPREP 26ML APPLICATOR (WOUND CARE) ×2 IMPLANT
ELECT REM PT RETURN 9FT ADLT (ELECTROSURGICAL) ×2
ELECTRODE REM PT RTRN 9FT ADLT (ELECTROSURGICAL) ×1 IMPLANT
GAUZE 4X4 16PLY RFD (DISPOSABLE) IMPLANT
GAUZE SPONGE 4X4 12PLY STRL (GAUZE/BANDAGES/DRESSINGS) IMPLANT
GLOVE BIO SURGEON STRL SZ 6.5 (GLOVE) ×3 IMPLANT
GLOVE BIO SURGEON STRL SZ7.5 (GLOVE) ×1 IMPLANT
GLOVE BIOGEL PI IND STRL 6.5 (GLOVE) IMPLANT
GLOVE BIOGEL PI IND STRL 7.5 (GLOVE) ×2 IMPLANT
GLOVE BIOGEL PI INDICATOR 6.5 (GLOVE) ×2
GLOVE BIOGEL PI INDICATOR 7.5 (GLOVE) ×4
GLOVE ECLIPSE 7.0 STRL STRAW (GLOVE) ×6 IMPLANT
GLOVE EXAM NITRILE XL STR (GLOVE) IMPLANT
GLOVE SURG SS PI 7.5 STRL IVOR (GLOVE) ×2 IMPLANT
GOWN STRL REUS W/ TWL LRG LVL3 (GOWN DISPOSABLE) ×2 IMPLANT
GOWN STRL REUS W/ TWL XL LVL3 (GOWN DISPOSABLE) IMPLANT
GOWN STRL REUS W/TWL 2XL LVL3 (GOWN DISPOSABLE) IMPLANT
GOWN STRL REUS W/TWL LRG LVL3 (GOWN DISPOSABLE) ×16
GOWN STRL REUS W/TWL XL LVL3 (GOWN DISPOSABLE)
GRAFT DURAGEN MATRIX 1WX1L (Tissue) ×1 IMPLANT
HEMOSTAT POWDER KIT SURGIFOAM (HEMOSTASIS) ×5 IMPLANT
KIT BASIN OR (CUSTOM PROCEDURE TRAY) ×2 IMPLANT
KIT INFUSE SMALL (Orthopedic Implant) ×1 IMPLANT
KIT TURNOVER KIT B (KITS) ×2 IMPLANT
MILL MEDIUM DISP (BLADE) ×2 IMPLANT
NDL HYPO 18GX1.5 BLUNT FILL (NEEDLE) IMPLANT
NDL SPNL 18GX3.5 QUINCKE PK (NEEDLE) IMPLANT
NEEDLE HYPO 18GX1.5 BLUNT FILL (NEEDLE) IMPLANT
NEEDLE HYPO 22GX1.5 SAFETY (NEEDLE) ×2 IMPLANT
NEEDLE SPNL 18GX3.5 QUINCKE PK (NEEDLE) IMPLANT
NS IRRIG 1000ML POUR BTL (IV SOLUTION) ×2 IMPLANT
OIL CARTRIDGE MAESTRO DRILL (MISCELLANEOUS) ×2
PACK LAMINECTOMY NEURO (CUSTOM PROCEDURE TRAY) ×2 IMPLANT
PAD ARMBOARD 7.5X6 YLW CONV (MISCELLANEOUS) ×6 IMPLANT
PATTIES SURGICAL .5 X.5 (GAUZE/BANDAGES/DRESSINGS) ×1 IMPLANT
PATTIES SURGICAL 1X1 (DISPOSABLE) ×2 IMPLANT
ROD SOLERA 80MM (Rod) ×4 IMPLANT
ROD SOLERA 80X4.75X (Rod) IMPLANT
SCREW 5.5X35MM (Screw) ×12 IMPLANT
SCREW BN 35X5.5XMA NS SPNE (Screw) IMPLANT
SCREW SET SOLERA (Screw) ×16 IMPLANT
SCREW SET SOLERA TI (Screw) IMPLANT
SCREW SOLERA 6.5X35 (Screw) ×2 IMPLANT
SEALANT ADHERUS EXTEND TIP (MISCELLANEOUS) ×1 IMPLANT
SPACER SPNL STD 23X8XSTRL (Cage) IMPLANT
SPCR SPNL STD 23X8XSTRL (Cage) ×2 IMPLANT
SPONGE LAP 4X18 RFD (DISPOSABLE) IMPLANT
SPONGE SURGIFOAM ABS GEL 100 (HEMOSTASIS) IMPLANT
STRIP CLOSURE SKIN 1/2X4 (GAUZE/BANDAGES/DRESSINGS) IMPLANT
SUT VIC AB 0 CT1 18XCR BRD8 (SUTURE) ×2 IMPLANT
SUT VIC AB 0 CT1 8-18 (SUTURE) ×6
SUT VICRYL 3-0 RB1 18 ABS (SUTURE) ×4 IMPLANT
SYR 3ML LL SCALE MARK (SYRINGE) ×6 IMPLANT
SYR BULB IRRIGATION 50ML (SYRINGE) ×1 IMPLANT
TOWEL GREEN STERILE (TOWEL DISPOSABLE) ×2 IMPLANT
TOWEL GREEN STERILE FF (TOWEL DISPOSABLE) ×2 IMPLANT
TRAY FOLEY MTR SLVR 16FR STAT (SET/KITS/TRAYS/PACK) ×2 IMPLANT
WATER STERILE IRR 1000ML POUR (IV SOLUTION) ×2 IMPLANT

## 2019-06-08 NOTE — Telephone Encounter (Signed)
Called patient again (x3), no answer, unable to leave vm. Letter mailed

## 2019-06-08 NOTE — Telephone Encounter (Signed)
-----   Message from Larey Dresser, MD sent at 06/06/2019  8:25 PM EDT ----- Probably a hydrocele (benign).  Should get evaluation by urology or primary care.

## 2019-06-08 NOTE — Anesthesia Procedure Notes (Signed)
Arterial Line Insertion Start/End10/27/2020 7:15 AM, 06/08/2019 7:20 AM Performed by: Leonor Liv, CRNA, CRNA  Patient location: Pre-op. Preanesthetic checklist: patient identified, IV checked, site marked, risks and benefits discussed, surgical consent, monitors and equipment checked and pre-op evaluation Lidocaine 1% used for infiltration Right, radial was placed Catheter size: 20 Fr Hand hygiene performed  and maximum sterile barriers used   Attempts: 1 Procedure performed without using ultrasound guided technique. Following insertion, dressing applied. Post procedure assessment: normal and unchanged  Post procedure complications: local hematoma. Patient tolerated the procedure well with no immediate complications.

## 2019-06-08 NOTE — Op Note (Signed)
NEUROSURGERY OPERATIVE NOTE   PREOP DIAGNOSIS:  1. Lumbar stenosis with neurogenic claudication, L3-4, L4-5, L5-S1 2. Lumbar spondylosis, L3-4, L4-5, L5-S1  POSTOP DIAGNOSIS: Same  PROCEDURE: 1. L3, L4, L5 laminectomy with complete facetectomy for decompression of exiting nerve roots, more than would be required for placement of interbody graft 2. Placement of anterior interbody device   Medtronic expandable 46mm lordotic x2 @ L3-4  Medtronic expandable 22mm lordotic x2 @ L4-5  Medtronic expandable 69mm lordotic x2 @ L5-S1 3. Posterior segmental instrumentation using cortical pedicle screws at L3 - S1 4. Interbody arthrodesis, L3-4, L4-5, L5-S1 5. Use of locally harvested bone autograft 6. Use of non-structural bone allograft - BMP  SURGEON: Dr. Consuella Lose, MD  ASSISTANT: Dr. Emelda Brothers, MD  ANESTHESIA: General Endotracheal  EBL: 1400cc  SPECIMENS: None  DRAINS: None  COMPLICATIONS: None immediate  CONDITION: Hemodynamically stable to PACU  HISTORY: Edward Mullen is a 63 y.o. male who has been followed in the outpatient clinic with back and leg pain related to multifactorial stenosis with spondylosis at L3-4, L4-5, and L5-S1. multiple conservative treatments were attempted without significant improvement and we ultimately elected to proceed with surgical decompression and fusion. Risks, benefits, and alternative treatments were reviewed in detail in the office. After all questions were answered, informed consent was obtained and witnessed.  PROCEDURE IN DETAIL: The patient was brought to the operating room via stretcher. After induction of general anesthesia, the patient was positioned on the operative table in the prone position. All pressure points were meticulously padded. Incision was then marked out and prepped and draped in the usual sterile fashion.  After timeout was conducted, skin was infiltrated with local anesthetic. Skin incision was then made  sharply and Bovie electrocautery was used to dissect the subcutaneous tissue until the lumbodorsal fascia was identified and incised. The muscle was then elevated in the subperiosteal plane and initially the L3 lamina and L3-4 facet complex was identified.  Penfield 4 dissector was placed into the interspace and lateral fluoroscopy confirmed our location at this level.  Dissection was further carried superiorly to identify the inferior portion of L2-3 facet, and inferiorly to identify the L4 lamina, L4-5 facet complex, L5 lamina, and L5-S1 facet complex.  Self-retaining retractors were then placed.  Of note, I was impressed with the degree of facet arthropathy at all 3 levels, especially L4-5 where facets were nearly to the level of the posterior aspect of the spinous processes, and facets were nearly in the midline.  At this point attention was turned to decompression. Complete  L3, L4, and L5 laminectomy was completed with a high-speed drill and Kerrison punches.  Normal dura was identified.  A ball-tipped dissector was then used to identify the foramina bilaterally at all 3 levels.  High-speed drill was used to cut across the pars interarticularis and the inferior articulating process of L3, L4, and L5 was removed bilaterally.    This allowed identification of the bilateral L3, L4, L5, and S1 nerve roots.  There was significant thickening of the ligamentum flavum laterally at all these levels.  Once removal of the ligamentum flavum was completed, I was able to freely pass a ball-tipped dissector out the foramina underneath the nerve roots at these levels indicating good decompression.  Disc spaces were then identified, incised bilaterally, and using a combination of shavers, curettes and rongeurs, complete discectomy was completed at all 3 levels.  During discectomy on the right at L4-5, a small durotomy was inadvertently created in  the axilla of the left L4 nerve root.  Small amount of CSF was noted to be  weeping, although the arachnoid appeared to be intact.  This durotomy was covered with half by half cottonoid and readdressed later in the case.  Endplates were prepared, and bone harvested during decompression was mixed with BMP and packed into the interspaces bilaterally at all 3 levels.  8 mm expandable cages were then incised and tapped into place at L3-4.  These were maximally expanded to 12 mm.  Position was confirmed with fluoroscopy.  In a similar fashion, a 7 mm expandable cages were selected for the L4-5 interspace.  Again, these were inserted bilaterally, expanded maximally to 11 mm, and position checked with lateral fluoroscopy.  Finally, a 7 mm expandable cages were selected for the L5-S1 interspace.  These were inserted bilaterally, expanded, and checked with lateral fluoroscopy.  At this point, the entry points for bilateral L3, L4, L5, and S1 cortical pedicle screws were identified using standard anatomic landmarks and lateral fluoro. Pilot holes were then drilled and tapped to 5.5 x 35 mm at L3, L4, and L5, and 6.5 x 35 mm at S1. Screws were then placed in L3, L4, L5, and S1 bilaterally.prior to placement of the rod, a 1 x 1 piece of DuraGen onlay graft was then placed over the durotomy at the right L4 nerve root.  This was then covered with a polyethylene glycol dural sealant.  No further CSF egress was identified.  Prebent 48mm lordotic rod was then sized and placed into the pedicle screws. Set screws were placed and final tightened. Final AP and lateral fluoroscopic images confirmed good position.  Hemostasis was secured and confirmed with bipolar cautery and morcellized gelfoam with thrombin. The wound was then irrigated with copious amounts of antibiotic saline, then closed in standard fashion using a combination of interrupted 0 and 3-0 Vicryl stitches in the muscular, fascial, and subcutaneous layers. Skin was then closed using standard Dermabond. Sterile dressing was then applied. The  patient was then transferred to the stretcher, extubated, and taken to the postanesthesia care unit in stable hemodynamic condition.  At the end of the case all sponge, needle, cottonoid, and instrument counts were correct.

## 2019-06-08 NOTE — Anesthesia Procedure Notes (Signed)
Procedure Name: Intubation Date/Time: 06/08/2019 8:53 AM Performed by: Kathryne Hitch, CRNA Pre-anesthesia Checklist: Patient identified, Emergency Drugs available, Suction available and Patient being monitored Patient Re-evaluated:Patient Re-evaluated prior to induction Oxygen Delivery Method: Circle system utilized Preoxygenation: Pre-oxygenation with 100% oxygen Induction Type: IV induction Ventilation: Mask ventilation without difficulty Laryngoscope Size: Miller and 2 Grade View: Grade I Tube type: Oral Tube size: 8.0 mm Number of attempts: 1 Airway Equipment and Method: Stylet and Oral airway Placement Confirmation: ETT inserted through vocal cords under direct vision,  positive ETCO2 and breath sounds checked- equal and bilateral Secured at: 22 cm Tube secured with: Tape Dental Injury: Teeth and Oropharynx as per pre-operative assessment

## 2019-06-08 NOTE — Progress Notes (Addendum)
Pt arrived on floor, transferred from PACU. Patient is A&OX4 and in no acute distress. no complaints of nausea. PIV site is clean, dry, intact, no signs of inflammation or breakdown. Lower back with honeycomb dsg. Foley catheter intact and secured draining clear, yellow, urine.   SCD's placed on patient lower extremieties & turned on. IS provided to patient and patient demonstrated usage of IS. Patient orienated to room and unit. Bedside table and personal belongings within reach of patient. Patient educated on usage of nurse call bell, placed within reach of patient. Patient instructed to call for assistance. Patient in transferred to bed. Will continue to monitor.   Report received from [facility], PACU RN.

## 2019-06-09 LAB — CBC
HCT: 31.5 % — ABNORMAL LOW (ref 39.0–52.0)
Hemoglobin: 10.5 g/dL — ABNORMAL LOW (ref 13.0–17.0)
MCH: 29 pg (ref 26.0–34.0)
MCHC: 33.3 g/dL (ref 30.0–36.0)
MCV: 87 fL (ref 80.0–100.0)
Platelets: 129 10*3/uL — ABNORMAL LOW (ref 150–400)
RBC: 3.62 MIL/uL — ABNORMAL LOW (ref 4.22–5.81)
RDW: 14.1 % (ref 11.5–15.5)
WBC: 14.7 10*3/uL — ABNORMAL HIGH (ref 4.0–10.5)
nRBC: 0 % (ref 0.0–0.2)

## 2019-06-09 LAB — BASIC METABOLIC PANEL
Anion gap: 5 (ref 5–15)
BUN: 15 mg/dL (ref 8–23)
CO2: 27 mmol/L (ref 22–32)
Calcium: 8.5 mg/dL — ABNORMAL LOW (ref 8.9–10.3)
Chloride: 107 mmol/L (ref 98–111)
Creatinine, Ser: 1.23 mg/dL (ref 0.61–1.24)
GFR calc Af Amer: >60 mL/min (ref >60)
GFR calc non Af Amer: >60 mL/min (ref >60)
Glucose, Bld: 114 mg/dL — ABNORMAL HIGH (ref 70–99)
Potassium: 4.1 mmol/L (ref 3.5–5.1)
Sodium: 139 mmol/L (ref 135–145)

## 2019-06-09 LAB — PROTIME-INR
INR: 1.1 (ref 0.8–1.2)
Prothrombin Time: 14 seconds (ref 11.4–15.2)

## 2019-06-09 LAB — APTT: aPTT: 31 seconds (ref 24–36)

## 2019-06-09 MED ORDER — ENSURE ENLIVE PO LIQD
237.0000 mL | Freq: Two times a day (BID) | ORAL | Status: DC
  Administered 2019-06-09: 237 mL via ORAL

## 2019-06-09 MED FILL — Gelatin Absorbable MT Powder: OROMUCOSAL | Qty: 1 | Status: AC

## 2019-06-09 MED FILL — Thrombin For Soln 5000 Unit: CUTANEOUS | Qty: 5000 | Status: AC

## 2019-06-09 NOTE — Evaluation (Signed)
Physical Therapy Evaluation Patient Details Name: Edward Mullen MRN: [id number] DOB: 05/10/1956 Today's Date: 06/09/2019   History of Present Illness  Pt admit for L3-S1 decompression and fusion. PMH:  Arthritis, COPD, HTN, substance abuse.   Clinical Impression  Pt admitted with above diagnosis. Pt was able to ambulate with RW with min guard assist.  Was min assist with his cane.  Will need RW at home for safety.  Should progress well.   Pt currently with functional limitations due to the deficits listed below (see PT Problem List). Pt will benefit from skilled PT to increase their independence and safety with mobility to allow discharge to the venue listed below.      Follow Up Recommendations Home health PT;Supervision/Assistance - 24 hour    Equipment Recommendations  Rolling walker with 5" wheels    Recommendations for Other Services       Precautions / Restrictions Precautions Precautions: Fall Required Braces or Orthoses: Spinal Brace Spinal Brace: Applied in sitting position Restrictions Weight Bearing Restrictions: No      Mobility  Bed Mobility Overal bed mobility: Independent             General bed mobility comments: performed log roll without cues  Transfers Overall transfer level: Needs assistance Equipment used: Rolling walker (2 wheeled);Straight cane Transfers: Sit to/from Stand Sit to Stand: Min assist;Min guard         General transfer comment: Needed min assist with cane as it was difficult for pt to get up from low bed.  Took 2 attempts to stand.   Ambulation/Gait Ambulation/Gait assistance: Min guard;Min assist Gait Distance (Feet): 350 Feet Assistive device: Rolling walker (2 wheeled);Straight cane Gait Pattern/deviations: Step-through pattern;Decreased stride length;Drifts right/left;Antalgic   Gait velocity interpretation: <1.31 ft/sec, indicative of household ambulator General Gait Details: Pt used cane initially but gait  unsteady and antalgic with pt widening BOS and at times unbalanced with cane.  Used the RW and pt did much better with min guard assist. Pt agrees to use RW at home.  Stairs            Wheelchair Mobility    Modified Rankin (Stroke Patients Only)       Balance Overall balance assessment: Needs assistance Sitting-balance support: No upper extremity supported;Feet supported Sitting balance-Leahy Scale: Fair     Standing balance support: Bilateral upper extremity supported;During functional activity Standing balance-Leahy Scale: Poor Standing balance comment: relies on Bil UE support for stability.                              Pertinent Vitals/Pain Pain Assessment: 0-10 Pain Score: 7  Pain Location: back Pain Descriptors / Indicators: Grimacing;Guarding Pain Intervention(s): Limited activity within patient's tolerance;Monitored during session;Premedicated before session;Repositioned    Home Living Family/patient expects to be discharged to:: Private residence Living Arrangements: Children;Other (Comment)(staying with daughter) Available Help at Discharge: Family;Available 24 hours/day Type of Home: Apartment Home Access: Stairs to enter Entrance Stairs-Rails: Right;Left;Can reach both Entrance Stairs-Number of Steps: 15 Home Layout: One level Home Equipment: Cane - single point      Prior Function Level of Independence: Independent with assistive device(s)         Comments: Cane for mobility. Does not drive, daughter gets groceries. Pt occasionally cooks and cleans but daughter helps out with household chores too. Independent with BADL.     Hand Dominance        Extremity/Trunk Assessment  Upper Extremity Assessment Upper Extremity Assessment: Defer to OT evaluation    Lower Extremity Assessment Lower Extremity Assessment: Generalized weakness       Communication   Communication: No difficulties  Cognition Arousal/Alertness:  Awake/alert Behavior During Therapy: WFL for tasks assessed/performed Overall Cognitive Status: Within Functional Limits for tasks assessed                                        General Comments General comments (skin integrity, edema, etc.): Pt able to don brace on his own.  Discussed back precaution sheet as well/     Exercises     Assessment/Plan    PT Assessment Patient needs continued PT services  PT Problem List Decreased activity tolerance;Decreased balance;Decreased mobility;Decreased knowledge of use of DME;Decreased safety awareness;Decreased knowledge of precautions       PT Treatment Interventions DME instruction;Gait training;Stair training;Functional mobility training;Therapeutic activities;Therapeutic exercise;Balance training;Patient/family education    PT Goals (Current goals can be found in the Care Plan section)  Acute Rehab PT Goals Patient Stated Goal: to go home with daughter PT Goal Formulation: With patient Time For Goal Achievement: 06/23/19 Potential to Achieve Goals: Good    Frequency Min 5X/week   Barriers to discharge        Co-evaluation               AM-PAC PT "6 Clicks" Mobility  Outcome Measure Help needed turning from your back to your side while in a flat bed without using bedrails?: None Help needed moving from lying on your back to sitting on the side of a flat bed without using bedrails?: None Help needed moving to and from a bed to a chair (including a wheelchair)?: A Little Help needed standing up from a chair using your arms (e.g., wheelchair or bedside chair)?: A Little Help needed to walk in hospital room?: A Little Help needed climbing 3-5 steps with a railing? : A Little 6 Click Score: 20    End of Session Equipment Utilized During Treatment: Gait belt Activity Tolerance: Patient limited by fatigue;Patient limited by pain Patient left: in chair;with call bell/phone within reach;with chair alarm  set Nurse Communication: Mobility status PT Visit Diagnosis: Muscle weakness (generalized) (M62.81)    Time: [id number] PT Time Calculation (min) (ACUTE ONLY): 18 min   Charges:   PT Evaluation $PT Eval Moderate Complexity: [place] Pager:  [phone]  Office:  [place] 06/09/2019, 2:06 PM

## 2019-06-09 NOTE — Progress Notes (Signed)
  NEUROSURGERY PROGRESS NOTE   No issues overnight.  Foley removed this am. Has urinated Complains of appropriate back soreness, no LE symptoms Has not been up ambulating yet  EXAM:  BP 137/88   Pulse 85   Temp 99 F (37.2 C) (Oral)   Resp 11   Ht 6' (1.829 m)   Wt 87.1 kg   SpO2 90%   BMI 26.04 kg/m   Awake, alert, oriented  Speech fluent, appropriate  CN grossly intact  5/5 BUE/BLE  Incision c/d/i  IMPRESSION/PLAN 63 y.o. male pod #1 L3-S1 decompression and fusion. Doing well.  - Ambulate in halls, PT/OT - Anticipate d/c tomorrow am

## 2019-06-09 NOTE — Progress Notes (Signed)
Initial Nutrition Assessment  DOCUMENTATION CODES:   Not applicable  INTERVENTION:  Provide Ensure Enlive po BID, each supplement provides 350 kcal and 20 grams of protein  Encourage adequate PO intake.   NUTRITION DIAGNOSIS:   Increased nutrient needs related to chronic illness(CHF) as evidenced by estimated needs.  GOAL:   Patient will meet greater than or equal to 90% of their needs  MONITOR:   PO intake, Supplement acceptance, Skin, Weight trends, Labs, I & O's  REASON FOR ASSESSMENT:   Malnutrition Screening Tool    ASSESSMENT:   63 y.o. 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 PMH CHF, CKD3.   10/27- s/p L3-S1 decompression and fusion  Pt was asleep during time of visit. RD unable to obtain pt nutrition history at this time. Per weight records pt with a 11% weight loss in 7 months. Meal completion 60% at lunch. RD to order nutritional supplements to aid in caloric and protein needs.   Labs and medications reviewed.   NUTRITION - FOCUSED PHYSICAL EXAM:    Most Recent Value  Orbital Region  Unable to assess  Upper Arm Region  Unable to assess  Thoracic and Lumbar Region  No depletion  Buccal Region  Unable to assess  Temple Region  Unable to assess  Clavicle Bone Region  Mild depletion  Clavicle and Acromion Bone Region  Mild depletion  Scapular Bone Region  Unable to assess  Dorsal Hand  Unable to assess  Patellar Region  No depletion  Anterior Thigh Region  No depletion  Posterior Calf Region  No depletion  Edema (RD Assessment)  None  Hair  Reviewed  Eyes  Unable to assess  Mouth  Unable to assess  Skin  Reviewed  Nails  Reviewed       Diet Order:   Diet Order            Diet regular Room service appropriate? Yes; Fluid consistency: Thin  Diet effective now              EDUCATION NEEDS:   Not appropriate for  education at this time  Skin:  Skin Assessment: Skin Integrity Issues: Skin Integrity Issues:: Incisions Incisions: back  Last BM:  Unknown  Height:   Ht Readings from Last 1 Encounters:  06/08/19 6' (1.829 m)    Weight:   Wt Readings from Last 1 Encounters:  06/08/19 87.1 kg    Ideal Body Weight:  81 kg  BMI:  Body mass index is 26.04 kg/m.  Estimated Nutritional Needs:   Kcal:  2150-2350  Protein:  105-115 grams  Fluid:  >/= 2 L/day    Corrin Parker, MS, RD, LDN Pager # [phone] After hours/ weekend pager # [phone]

## 2019-06-09 NOTE — Transfer of Care (Signed)
Immediate Anesthesia Transfer of Care Note  Patient: Edward Mullen  Procedure(s) Performed: POSTERIOR LUMBAR INTERBODY FUSION LUMBAR THREE- LUMBAR FOUR, LUMBAR FOUR- LUMBAR FIVE, LUMBAR FIVE- SACRAL ONE WITH INTERBODY CAGES, INTERBODY ARTHRODESIS, POSTERIOR SEGMENTAL INSTRUMENTATION (N/A )  Patient Location: PACU  Anesthesia Type:General  Level of Consciousness: drowsy and patient cooperative  Airway & Oxygen Therapy: Patient Spontanous Breathing and Patient connected to face mask oxygen  Post-op Assessment: Report given to RN and Post -op Vital signs reviewed and stable  Post vital signs: Reviewed and stable  Last Vitals:  Vitals Value Taken Time  BP 158/92 06/09/19 0833  Temp 37.7 C 06/09/19 0833  Pulse 89 06/09/19 0833  Resp 16 06/09/19 0833  SpO2 97 % 06/09/19 0833    Last Pain:  Vitals:   06/09/19 0833  TempSrc: Oral  PainSc:       Patients Stated Pain Goal: 3 ([id number])  Complications: No apparent anesthesia complications

## 2019-06-09 NOTE — Plan of Care (Signed)
  Problem: Education: Goal: Knowledge of General Education information will improve Description: Including pain rating scale, medication(s)/side effects and non-pharmacologic comfort measures Outcome: Progressing   Problem: Health Behavior/Discharge Planning: Goal: Ability to manage health-related needs will improve Outcome: Progressing   Problem: Clinical Measurements: Goal: Ability to maintain clinical measurements within normal limits will improve Outcome: Progressing   Problem: Clinical Measurements: Goal: Will remain free from infection Outcome: Progressing   Problem: Clinical Measurements: Goal: Diagnostic test results will improve Outcome: Progressing   Problem: Clinical Measurements: Goal: Respiratory complications will improve Outcome: Progressing   Problem: Activity: Goal: Risk for activity intolerance will decrease Outcome: Progressing

## 2019-06-10 ENCOUNTER — Encounter (HOSPITAL_COMMUNITY): Payer: Self-pay

## 2019-06-10 ENCOUNTER — Other Ambulatory Visit: Payer: Self-pay

## 2019-06-10 ENCOUNTER — Emergency Department (HOSPITAL_COMMUNITY): Payer: Medicare Other

## 2019-06-10 ENCOUNTER — Emergency Department (HOSPITAL_COMMUNITY)
Admission: EM | Admit: 2019-06-10 | Discharge: 2019-06-10 | Disposition: A | Discharge status: Home / Self Care | Payer: Medicare Other | Attending: Emergency Medicine | Admitting: Emergency Medicine

## 2019-06-10 DIAGNOSIS — E1122 Type 2 diabetes mellitus with diabetic chronic kidney disease: Secondary | ICD-10-CM | POA: Insufficient documentation

## 2019-06-10 DIAGNOSIS — Z79899 Other long term (current) drug therapy: Secondary | ICD-10-CM | POA: Insufficient documentation

## 2019-06-10 DIAGNOSIS — R55 Syncope and collapse: Secondary | ICD-10-CM | POA: Insufficient documentation

## 2019-06-10 DIAGNOSIS — I130 Hypertensive heart and chronic kidney disease with heart failure and stage 1 through stage 4 chronic kidney disease, or unspecified chronic kidney disease: Secondary | ICD-10-CM | POA: Insufficient documentation

## 2019-06-10 DIAGNOSIS — F17210 Nicotine dependence, cigarettes, uncomplicated: Secondary | ICD-10-CM | POA: Insufficient documentation

## 2019-06-10 DIAGNOSIS — Z981 Arthrodesis status: Secondary | ICD-10-CM

## 2019-06-10 DIAGNOSIS — Z9889 Other specified postprocedural states: Secondary | ICD-10-CM | POA: Insufficient documentation

## 2019-06-10 DIAGNOSIS — I5022 Chronic systolic (congestive) heart failure: Secondary | ICD-10-CM | POA: Insufficient documentation

## 2019-06-10 DIAGNOSIS — N183 Chronic kidney disease, stage 3 unspecified: Secondary | ICD-10-CM | POA: Insufficient documentation

## 2019-06-10 LAB — CBC WITH DIFFERENTIAL/PLATELET
Abs Immature Granulocytes: 0.04 10*3/uL (ref 0.00–0.07)
Basophils Absolute: 0 10*3/uL (ref 0.0–0.1)
Basophils Relative: 0 %
Eosinophils Absolute: 0 10*3/uL (ref 0.0–0.5)
Eosinophils Relative: 0 %
HCT: 32.8 % — ABNORMAL LOW (ref 39.0–52.0)
Hemoglobin: 10.8 g/dL — ABNORMAL LOW (ref 13.0–17.0)
Immature Granulocytes: 0 %
Lymphocytes Relative: 6 %
Lymphs Abs: 0.7 10*3/uL (ref 0.7–4.0)
MCH: 28.8 pg (ref 26.0–34.0)
MCHC: 32.9 g/dL (ref 30.0–36.0)
MCV: 87.5 fL (ref 80.0–100.0)
Monocytes Absolute: 1.9 10*3/uL — ABNORMAL HIGH (ref 0.1–1.0)
Monocytes Relative: 16 %
Neutro Abs: 9.1 10*3/uL — ABNORMAL HIGH (ref 1.7–7.7)
Neutrophils Relative %: 78 %
Platelets: 125 10*3/uL — ABNORMAL LOW (ref 150–400)
RBC: 3.75 MIL/uL — ABNORMAL LOW (ref 4.22–5.81)
RDW: 14.2 % (ref 11.5–15.5)
WBC: 11.7 10*3/uL — ABNORMAL HIGH (ref 4.0–10.5)
nRBC: 0 % (ref 0.0–0.2)

## 2019-06-10 LAB — BASIC METABOLIC PANEL
Anion gap: 8 (ref 5–15)
BUN: 14 mg/dL (ref 8–23)
CO2: 27 mmol/L (ref 22–32)
Calcium: 9 mg/dL (ref 8.9–10.3)
Chloride: 101 mmol/L (ref 98–111)
Creatinine, Ser: 1.24 mg/dL (ref 0.61–1.24)
GFR calc Af Amer: >60 mL/min (ref >60)
GFR calc non Af Amer: >60 mL/min (ref >60)
Glucose, Bld: 101 mg/dL — ABNORMAL HIGH (ref 70–99)
Potassium: 3.9 mmol/L (ref 3.5–5.1)
Sodium: 136 mmol/L (ref 135–145)

## 2019-06-10 LAB — CBG MONITORING, ED: Glucose-Capillary: 92 mg/dL (ref 70–99)

## 2019-06-10 MED ORDER — OXYCODONE-ACETAMINOPHEN 7.5-325 MG PO TABS: 1.0000 | ORAL_TABLET | ORAL | 0 refills | Status: DC | PRN

## 2019-06-10 MED ORDER — IOHEXOL 350 MG/ML SOLN
100.0000 mL | Freq: Once | INTRAVENOUS | Status: AC | PRN
  Administered 2019-06-10: 20:00:00 63 mL via INTRAVENOUS

## 2019-06-10 MED ORDER — METHOCARBAMOL 750 MG PO TABS: 750.0000 mg | ORAL_TABLET | Freq: Three times a day (TID) | ORAL | 1 refills | Status: DC | PRN

## 2019-06-10 MED ORDER — SODIUM CHLORIDE 0.9 % IV BOLUS
500.0000 mL | Freq: Once | INTRAVENOUS | Status: AC
  Administered 2019-06-10: 500 mL via INTRAVENOUS

## 2019-06-10 NOTE — Progress Notes (Signed)
  NEUROSURGERY PROGRESS NOTE   No issues overnight.  Pain much improved compared to yesterday Ambulating with help of rolling walker with minimal difficulty Voiding normal. Tolerating po. Ready for d/c  EXAM:  BP 102/66 (BP Location: Left Arm)   Pulse 91   Temp 97.7 F (36.5 C) (Oral)   Resp 16   Ht 6' (1.829 m)   Wt 87.1 kg   SpO2 99%   BMI 26.04 kg/m   Awake, alert, oriented  Speech fluent, appropriate  CN grossly intact  5/5 BUE/BLE  Incision: c/d/i  IMPRESSION/PLAN 63 y.o. male POD#2 L3-S1 decompression and fusion. Doing well - D/C home to sisters

## 2019-06-10 NOTE — TOC Transition Note (Signed)
Transition of Care [facility]) - CM/SW Discharge Note Marvetta Gibbons RN,BSN Transitions of Care Unit 4NP (non trauma) - RN Case Manager [phone]   Patient Details  Name: Edward Mullen MRN: [id number] Date of Birth: 09/28/1955  Transition of Care [facility]) CM/SW Contact:  Dawayne Patricia, RN Phone Number: 06/10/2019, 11:23 AM   Clinical Narrative:    Pt stable for transition home. Orders placed for HHPT/OT and DME- RW. CM spoke with pt at bedside- pt agreeable to [facility] and states he needs RW for home. Confirmed address, phone #s and PCP in epic. Pt reports he lives with daughter currently but is working with case worker at [employer] for housing of his own. Pt does not have Medicare part D- however does have Medicaid for drug coverage needs. List provided to pt Per CMS guidelines from medicare.gov website with star ratings (copy placed in shadow chart) for [facility] choice- per pt he has selected [facility] as first choice ([facility] as second if [facility] unable to accept)- call made to [place] with [facility] for [facility] referral- per Meredeth Ide can accept referral for HHPT/OT. They will f/u with pt for [facility] visit.   Final next level of care: [place] Barriers to Discharge: No Barriers Identified   Patient Goals and CMS Choice Patient states their goals for this hospitalization and ongoing recovery are:: to get back to walking with less pain CMS Medicare.gov Compare Post Acute Care list provided to:: Patient Choice offered to / list presented to : Patient  Discharge Placement             Home with [facility]          Discharge Plan and Services   Discharge Planning Services: CM Consult Post Acute Care Choice: Durable Medical Equipment, Home Health          DME Arranged: Walker rolling DME Agency: [employer] Date DME Agency Contacted: 06/10/19 Time DME Agency Contacted: 1000 Representative spoke with at DME Agency: Thedore Mins HH Arranged: PT, OT [place] Agency: [place] Date [place]: 06/10/19 Time [place]: 1122 Representative spoke with at [place]: [place] ([place]) Interventions     Readmission Risk Interventions Readmission Risk Prevention Plan 06/10/2019  Transportation Screening Complete  PCP or Specialist Appt within 5-7 Days Complete  Home Care Screening Complete  Medication Review (RN CM) Complete  Some recent data might be hidden

## 2019-06-10 NOTE — Anesthesia Postprocedure Evaluation (Signed)
Anesthesia Post Note  Patient: Edward Mullen  Procedure(s) Performed: POSTERIOR LUMBAR INTERBODY FUSION LUMBAR THREE- LUMBAR FOUR, LUMBAR FOUR- LUMBAR FIVE, LUMBAR FIVE- SACRAL ONE WITH INTERBODY CAGES, INTERBODY ARTHRODESIS, POSTERIOR SEGMENTAL INSTRUMENTATION (N/A )     Patient location during evaluation: PACU Anesthesia Type: General Level of consciousness: awake and alert Pain management: pain level controlled Vital Signs Assessment: post-procedure vital signs reviewed and stable Respiratory status: spontaneous breathing, nonlabored ventilation, respiratory function stable and patient connected to nasal cannula oxygen Cardiovascular status: blood pressure returned to baseline and stable Postop Assessment: no apparent nausea or vomiting Anesthetic complications: no    Last Vitals:  Vitals:   06/09/19 2345 06/10/19 0358  BP: 105/69 100/76  Pulse: 96 96  Resp:    Temp: 36.9 C 37.6 C  SpO2: 97% 99%    Last Pain:  Vitals:   06/10/19 0609  TempSrc:   PainSc: 3    Pain Goal: Patients Stated Pain Goal: 5 (06/09/19 0850)                 Montez Hageman

## 2019-06-10 NOTE — ED Notes (Signed)
Patient verbalizes understanding of discharge instructions. Opportunity for questioning and answers were provided. Armband removed by staff, pt discharged from ED. Pt. ambulatory and discharged home.  

## 2019-06-10 NOTE — ED Provider Notes (Signed)
[facility] EMERGENCY DEPARTMENT Provider Note   CSN: [id number] Arrival date & time: 06/10/19  1552     History   Chief Complaint No chief complaint on file.   HPI Edward Mullen is a 63 y.o. male.     Patient is a 63 year old male with past medical history of nonischemic cardiomyopathy, congestive heart failure, hypertension, and back surgery 2 days ago.  Patient was discharged from the hospital this morning and doing well.  Shortly after arriving home, he got up to go to the bathroom when he began to feel "warm in the head" then apparently passed out on the floor.  He was found by his family members unresponsive, they were able to wake him up and he now feels fine.  He denies any back pain, headache, chest pain, difficulty breathing.  He states that he feels the same now as he did before the episode.  The history is provided by the patient.    Past Medical History:  Diagnosis Date  . Arthritis    "feels like it in my legs" (09/20/2014)  . Borderline type 2 diabetes mellitus    patient denies  . CHF (congestive heart failure) ([place])   . Dysrhythmia   . Hypertension   . NSVT (nonsustained ventricular tachycardia) ([place]) 09/06/2016    Patient Active Problem List   Diagnosis Date Noted  . Lumbar spinal stenosis 06/08/2019  . Healthcare maintenance 05/11/2019  . Housing problems 05/11/2019  . Difficulty urinating 04/07/2018  . Colon cancer screening 04/07/2018  . Anemia 12/19/2016  . CKD (chronic kidney disease) stage 3, GFR 30-59 ml/min 12/13/2016  . Chronic systolic heart failure ([place]) 09/06/2016  . Non-ischemic cardiomyopathy ([place]) 08/27/2016  . 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 Mitral regurgitation 08/27/2016  . Polysubstance abuse ([place])   . Tobacco abuse 09/19/2014  . HTN (hypertension) 03/28/2011  . Bilateral lower extremity pain 03/28/2011    Past Surgical History:  Procedure Laterality Date  . CARDIAC  CATHETERIZATION N/A 08/27/2016   Procedure: Right/Left Heart Cath and Coronary Angiography;  Surgeon: Larey Dresser, MD;  Location: [facility];  Service: Cardiovascular;  Laterality: N/A;  . COLONOSCOPY    . CYSTECTOMY Right    "back of my shoulder"  . TONSILLECTOMY          Home Medications    Prior to Admission medications   Medication Sig Start Date End Date Taking? Authorizing Provider  BIDIL 20-37.5 MG tablet TAKE 2 TABLETS BY MOUTH 3 (THREE) TIMES A DAY 06/02/19   Larey Dresser, MD  ENTRESTO 49-51 MG TAKE ONE TABLET BY MOUTH TWICE DAILY 04/12/19   Larey Dresser, MD  eplerenone (INSPRA) 25 MG tablet TAKE ONE TABLET BY MOUTH ONCE DAILY 04/12/19   Larey Dresser, MD  furosemide (LASIX) 20 MG tablet TAKE ONE TABLET BY MOUTH ONCE DAILY 06/02/19   Larey Dresser, MD  gabapentin (NEURONTIN) 300 MG capsule TAKE 2 CAPSULES (600 MG TOTAL) BY MOUTH 2 (TWO) TIMES DAILY. 01/19/19   Dorrell, Andree Elk, MD  methocarbamol (ROBAXIN-750) 750 MG tablet Take 1 tablet (750 mg total) by mouth 3 (three) times daily as needed for muscle spasms. 06/10/19   Costella, Vista Mink, PA-C  oxyCODONE-acetaminophen (PERCOCET) 7.5-325 MG tablet Take 1 tablet by mouth every 4 (four) hours as needed for severe pain. 06/10/19   Costella, Vista Mink, PA-C  pregabalin (LYRICA) 75 MG capsule Take 1 capsule (75 mg total) by mouth 2 (two) times daily.  03/09/19   Harvie Heck, MD  rosuvastatin (CRESTOR) 5 MG tablet TAKE ONE TABLET BY MOUTH ONCE DAILY 04/12/19   Larey Dresser, MD    Family History Family History  Problem Relation Age of Onset  . Stroke Mother 66  . Heart disease Father 63  . Colon cancer Neg Hx   . Colon polyps Neg Hx   . Esophageal cancer Neg Hx   . Stomach cancer Neg Hx   . Rectal cancer Neg Hx     Social History Social History   Tobacco Use  . Smoking status: Current Every Day Smoker    Packs/day: 0.10    Years: 48.00    Pack years: 4.80    Types: Cigarettes  . Smokeless  tobacco: Never Used  . Tobacco comment: cutting back 2  per day  Substance Use Topics  . Alcohol use: Yes    Alcohol/week: 1.0 standard drinks    Types: 1 Cans of beer per week    Comment: occasionally  . Drug use: Yes    Types: Marijuana    Comment: daily 4 times last 2 weeks ago as of 05/31/2019     Allergies   Patient has no known allergies.   Review of Systems Review of Systems  All other systems reviewed and are negative.    Physical Exam Updated Vital Signs BP (!) 105/20 (BP Location: Right Arm)   Pulse 93   Temp 98.5 F (36.9 C) (Oral)   Resp 20   SpO2 96%   Physical Exam Vitals signs and nursing note reviewed.  Constitutional:      General: He is not in acute distress.    Appearance: He is well-developed. He is not diaphoretic.  HENT:     Head: Normocephalic and atraumatic.     Mouth/Throat:     Mouth: Mucous membranes are moist.  Eyes:     Extraocular Movements: Extraocular movements intact.     Pupils: Pupils are equal, round, and reactive to light.  Neck:     Musculoskeletal: Normal range of motion and neck supple.  Cardiovascular:     Rate and Rhythm: Normal rate and regular rhythm.     Heart sounds: No murmur. No friction rub.  Pulmonary:     Effort: Pulmonary effort is normal. No respiratory distress.     Breath sounds: Normal breath sounds. No wheezing or rales.  Abdominal:     General: Bowel sounds are normal. There is no distension.     Palpations: Abdomen is soft.     Tenderness: There is no abdominal tenderness.  Musculoskeletal: Normal range of motion.  Skin:    General: Skin is warm and dry.  Neurological:     General: No focal deficit present.     Mental Status: He is alert and oriented to person, place, and time.     Cranial Nerves: No cranial nerve deficit.     Sensory: No sensory deficit.     Motor: No weakness.     Coordination: Coordination normal.      ED Treatments / Results  Labs (all labs ordered are listed, but  only abnormal results are displayed) Labs Reviewed  BASIC METABOLIC PANEL  CBC WITH DIFFERENTIAL/PLATELET  CBG MONITORING, ED    EKG EKG Interpretation  Date/Time:  Thursday June 10 2019 15:57:35 EDT Ventricular Rate:  85 PR Interval:    QRS Duration: 135 QT Interval:  368 QTC Calculation: 438 R Axis:   -19 Text Interpretation: Sinus rhythm Left bundle  branch block No significant change since 05/03/2019 Confirmed by Veryl Speak [phone]) on 06/10/2019 4:06:25 PM   Radiology No results found.  Procedures Procedures (including critical care time)  Medications Ordered in ED Medications  sodium chloride 0.9 % bolus 500 mL (has no administration in time range)     Initial Impression / Assessment and Plan / ED Course  I have reviewed the triage vital signs and the nursing notes.  Pertinent labs & imaging results that were available during my care of the patient were reviewed by me and considered in my medical decision making (see chart for details).  Patient is a 63 year old male with history of CHF, nonischemic cardiomyopathy, and back surgery 2 days ago.  He was discharged this afternoon, then had a syncopal episode at home.  Patient has been observed here for approximately 5 hours and has had no additional episodes.  His vital signs have remained stable and his work-up is unremarkable including laboratory studies, CT scan of the head, lumbar spine, and chest.  There is no evidence for PE.  At this point, the disposition was discussed with the patient.  He was offered admission, however prefers to go home.  Patient will be discharged and understands to return if symptoms worsen or change.  I suspect the patient may have had some sort of vasovagal episode when he stood to go to the bathroom.  Patient is on multiple antihypertensive medications and was recently in the hospital and likely may be dehydrated.  He was given a gentle bolus of fluid here.  I personally checked  orthostatic vital signs and the patient's blood pressure and heart rate remained stable going from lying to standing (he was unable to tolerate sitting due to his back discomfort).  Final Clinical Impressions(s) / ED Diagnoses   Final diagnoses:  None    ED Discharge Orders    None       Veryl Speak, MD 06/10/19 2102

## 2019-06-10 NOTE — Progress Notes (Signed)
Physical Therapy Treatment Patient Details Name: Edward Mullen MRN: [id number] DOB: Jun 16, 1956 Today's Date: 06/10/2019    History of Present Illness Pt admit for L3-S1 decompression and fusion. PMH:  Arthritis, COPD, HTN, substance abuse.     PT Comments    Patient with plans to discharge today. Agrees to need to practice flight of steps to get into apartment. Patient required min cues for proper use of RW and upright posture while ambulating. By end of session able to verbalize back precautions and demonstrate correct technique for sit to side to supine. Agree with HHPT to reinforce safe use of RW and back precautions.     Follow Up Recommendations  Home health PT;Supervision/Assistance - 24 hour     Equipment Recommendations  Rolling walker with 5" wheels    Recommendations for Other Services       Precautions / Restrictions Precautions Precautions: Fall;Back Precaution Booklet Issued: Yes (comment) Precaution Comments: pt stated 2/3 beginning of session; 3/3 at end of session Required Braces or Orthoses: Spinal Brace Spinal Brace: Applied in sitting position Restrictions Weight Bearing Restrictions: No    Mobility  Bed Mobility Overal bed mobility: Independent             General bed mobility comments: performed sit to side to supine with HOB 0 and no cues  Transfers Overall transfer level: Needs assistance Equipment used: Rolling walker (2 wheeled) Transfers: Sit to/from Stand Sit to Stand: Min guard         General transfer comment: min guard for safety, cues for safe hand placement (pt starts with hands on RW, then leaves hands on RW to sit)  Ambulation/Gait Ambulation/Gait assistance: Min guard Gait Distance (Feet): 200 Feet Assistive device: Rolling walker (2 wheeled) Gait Pattern/deviations: Step-through pattern;Decreased stride length;Antalgic;Trunk flexed     General Gait Details: vc for proximity to RW to prevent trunk  flexion   Stairs Stairs: Yes Stairs assistance: Min guard Stair Management: One rail Left;Two rails;Step to pattern;Sideways;Forwards Number of Stairs: 12 General stair comments: ascend with bil rails; descend sideways due to rails too far apart and causing incr flexion   Wheelchair Mobility    Modified Rankin (Stroke Patients Only)       Balance Overall balance assessment: Needs assistance Sitting-balance support: No upper extremity supported;Feet supported Sitting balance-Leahy Scale: Fair     Standing balance support: No upper extremity supported Standing balance-Leahy Scale: Fair Standing balance comment: relies on Bil UE support for stability.                             Cognition Arousal/Alertness: Awake/alert Behavior During Therapy: WFL for tasks assessed/performed Overall Cognitive Status: Within Functional Limits for tasks assessed                                        Exercises      General Comments General comments (skin integrity, edema, etc.): vc when doffing brace to prepare brace for donning later      Pertinent Vitals/Pain Pain Assessment: 0-10 Pain Score: 9  Pain Location: back Pain Descriptors / Indicators: Grimacing;Guarding Pain Intervention(s): Monitored during session;Repositioned    Home Living Family/patient expects to be discharged to:: Private residence Living Arrangements: Children;Other (Comment)(dtr) Available Help at Discharge: Family;Available 24 hours/day Type of Home: Apartment Home Access: Stairs to enter Entrance Stairs-Rails: Right;Left;Can reach both Home  Layout: One level Home Equipment: Cane - single point      Prior Function Level of Independence: Independent with assistive device(s)      Comments: Cane for mobility. Does not drive, daughter gets groceries. Pt occasionally cooks and cleans but daughter helps out with household chores too. Independent with BADL.   PT Goals (current  goals can now be found in the care plan section) Acute Rehab PT Goals Patient Stated Goal: to go home with daughter PT Goal Formulation: With patient Time For Goal Achievement: 06/23/19 Potential to Achieve Goals: Good Progress towards PT goals: Progressing toward goals    Frequency    Min 5X/week      PT Plan Current plan remains appropriate    Co-evaluation              AM-PAC PT "6 Clicks" Mobility   Outcome Measure  Help needed turning from your back to your side while in a flat bed without using bedrails?: None Help needed moving from lying on your back to sitting on the side of a flat bed without using bedrails?: None Help needed moving to and from a bed to a chair (including a wheelchair)?: A Little Help needed standing up from a chair using your arms (e.g., wheelchair or bedside chair)?: A Little Help needed to walk in hospital room?: A Little Help needed climbing 3-5 steps with a railing? : A Little 6 Click Score: 20    End of Session Equipment Utilized During Treatment: Gait belt Activity Tolerance: Patient limited by pain Patient left: with call bell/phone within reach;in bed Nurse Communication: Mobility status PT Visit Diagnosis: Muscle weakness (generalized) (M62.81)     Time: [id number] PT Time Calculation (min) (ACUTE ONLY): 16 min  Charges:                         Edward Mullen, PT Pager [phone]    Rexanne Mano 06/10/2019, 11:04 AM

## 2019-06-10 NOTE — Discharge Summary (Signed)
Physician Discharge Summary  Patient ID: Edward Mullen MRN: [id number] DOB/AGE: 65-Apr-1957 65 y.o.  Admit date: 06/08/2019 Discharge date: 06/10/2019  Admission Diagnoses:  Lumbar spinal stenosis  Discharge Diagnoses:  Same Active Problems:   Lumbar spinal stenosis   Discharged Condition: Stable  Hospital Course:  Edward Mullen is a 63 y.o. male who was admitted for the below procedure. There were no post operative complications. At time of discharge, pain was well controlled, ambulating with Pt/OT, tolerating po, voiding normal. Ready for discharge.  Treatments: Surgery 1. L3, L4, L5 laminectomy with complete facetectomy for decompression of exiting nerve roots, more than would be required for placement of interbody graft 2. Placement of anterior interbody device              Medtronic expandable 8mm lordotic x2 @ L3-4             Medtronic expandable 7mm lordotic x2 @ L4-5             Medtronic expandable 7mm lordotic x2 @ L5-S1 3. Posterior segmental instrumentation using cortical pedicle screws at L3 - S1 4. Interbody arthrodesis, L3-4, L4-5, L5-S1 5. Use of locally harvested bone autograft 6. Use of non-structural bone allograft - BMP  Discharge Exam: Blood pressure 102/66, pulse 91, temperature 97.7 F (36.5 C), temperature source Oral, resp. rate 16, height 6' (1.829 m), weight 87.1 kg, SpO2 99 %. Awake, alert, oriented Speech fluent, appropriate CN grossly intact 5/5 BUE/BLE Wound c/d/i  Disposition: Discharge disposition: 01-Home or Self Care       Discharge Instructions    Call MD for:  difficulty breathing, headache or visual disturbances   Complete by: As directed    Call MD for:  persistant dizziness or light-headedness   Complete by: As directed    Call MD for:  redness, tenderness, or signs of infection (pain, swelling, redness, odor or green/yellow discharge around incision site)   Complete by: As directed    Call MD for:  severe  uncontrolled pain   Complete by: As directed    Call MD for:  temperature >100.4   Complete by: As directed    Diet general   Complete by: As directed    Driving Restrictions   Complete by: As directed    Do not drive until given clearance.   Increase activity slowly   Complete by: As directed    Lifting restrictions   Complete by: As directed    Do not lift anything >10lbs. Avoid bending and twisting in awkward positions. Avoid bending at the back.   May shower / Bathe   Complete by: As directed    In 24 hours. Okay to wash wound with warm soapy water. Avoid scrubbing the wound. Pat dry.   Remove dressing in 24 hours   Complete by: As directed      Allergies as of 06/10/2019   No Known Allergies     Medication List    TAKE these medications   BiDil 20-37.5 MG tablet Generic drug: isosorbide-hydrALAZINE TAKE 2 TABLETS BY MOUTH 3 (THREE) TIMES A DAY   Entresto 49-51 MG Generic drug: sacubitril-valsartan TAKE ONE TABLET BY MOUTH TWICE DAILY   eplerenone 25 MG tablet Commonly known as: INSPRA TAKE ONE TABLET BY MOUTH ONCE DAILY   furosemide 20 MG tablet Commonly known as: LASIX TAKE ONE TABLET BY MOUTH ONCE DAILY   gabapentin 300 MG capsule Commonly known as: NEURONTIN TAKE 2 CAPSULES (600 MG TOTAL) BY MOUTH 2 (TWO)  TIMES DAILY.   methocarbamol 750 MG tablet Commonly known as: Robaxin-750 Take 1 tablet (750 mg total) by mouth 3 (three) times daily as needed for muscle spasms.   oxyCODONE-acetaminophen 7.5-325 MG tablet Commonly known as: Percocet Take 1 tablet by mouth every 4 (four) hours as needed for severe pain.   pregabalin 75 MG capsule Commonly known as: Lyrica Take 1 capsule (75 mg total) by mouth 2 (two) times daily.   rosuvastatin 5 MG tablet Commonly known as: CRESTOR TAKE ONE TABLET BY MOUTH ONCE DAILY            Durable Medical Equipment  (From admission, onward)         Start     Ordered   06/10/19 0833  For home use only DME  Walker rolling  [facility])  Once    Question:  Patient needs a walker to treat with the following condition  Answer:  Gait instability   06/10/19 0832         Follow-up Information    Lisbeth Renshaw, MD. Schedule an appointment as soon as possible for a visit in 3 week(s).   Specialty: Neurosurgery Contact information: [address] [place] [address] [phone]           Signed: Alyson Ingles 06/10/2019, 8:32 AM

## 2019-06-10 NOTE — Discharge Instructions (Addendum)
Continue medications as previously prescribed.  Be cautious when standing.  Return to the emergency department if you develop any new and/or concerning symptoms.

## 2019-06-10 NOTE — ED Triage Notes (Signed)
Pt had a syncope episode at his daughters house, states he was walking to the bathroom when he began to feel really hot/swesty & then he remembers being helped out of the floor. Fire dept reported that they measured a BP of 86/60 & EMS reports a BP of 110/66 after a re-check when they arrived. Upon arrival to ED pt is A/Ox4. Pt is 2 days post-op from back surgery, he was wearing his back brace when he fell to the ground. Pt denies pain in the surgical site area related to the fall.

## 2019-06-10 NOTE — ED Notes (Signed)
Patient transported to CT 

## 2019-06-10 NOTE — Evaluation (Signed)
Occupational Therapy Evaluation Patient Details Name: Edward Mullen MRN: [id number] DOB: [birth year] Today's Date: 06/10/2019    History of Present Illness Pt admit for L3-S1 decompression and fusion. PMH:  Arthritis, COPD, HTN, substance abuse.    Clinical Impression   Pt admitted with above diagnoses, presenting with post sx pain and precautions limiting ability to engage in BADL at desired level of independence. PTA pt living with dtr, assist for IADL but independent in BADL. At time of eval, pt independent for bed mobility and min guard for sit <> stand transfers with RW. HE is able to complete household distance of functional mobility with RW at min guard assist. Pt able to complete functional mobility in hallway, limited due to urinary frequency with accident on floor. He shares this is baseline with fluid pills he takes. Educated on adaptive BADL strategies to maintain precautions, pt able to recall 3/3. Educated on possibly requiring toilet aide. No OT f/u needed at this time, will continue to follow acutely but anticipate d/c today.    Follow Up Recommendations  No OT follow up;Supervision/Assistance - 24 hour;Other (comment)(initially)    Equipment Recommendations  3 in 1 bedside commode    Recommendations for Other Services       Precautions / Restrictions Precautions Precautions: Fall Required Braces or Orthoses: Spinal Brace Spinal Brace: Applied in sitting position Restrictions Weight Bearing Restrictions: No      Mobility Bed Mobility Overal bed mobility: Independent             General bed mobility comments: performed log roll without cues  Transfers Overall transfer level: Needs assistance Equipment used: Rolling walker (2 wheeled) Transfers: Sit to/from Stand Sit to Stand: Min guard         General transfer comment: min guard for safety, cues for safe hand placement    Balance Overall balance assessment: Needs assistance Sitting-balance  support: No upper extremity supported;Feet supported Sitting balance-Leahy Scale: Fair     Standing balance support: Bilateral upper extremity supported;During functional activity Standing balance-Leahy Scale: Poor Standing balance comment: relies on Bil UE support for stability.                            ADL either performed or assessed with clinical judgement   ADL Overall ADL's : Needs assistance/impaired Eating/Feeding: Set up;Sitting   Grooming: Min guard;Standing Grooming Details (indicate cue type and reason): education for two cup method Upper Body Bathing: Set up;Sitting   Lower Body Bathing: Set up;Sit to/from stand;Sitting/lateral leans;With adaptive equipment   Upper Body Dressing : Set up;Sitting   Lower Body Dressing: Set up;Sitting/lateral leans;Sit to/from stand;Cueing for back precautions   Toilet Transfer: Min guard;Regular Toilet;Grab bars;Transfer board   Toileting- Clothing Manipulation and Hygiene: Min guard;Sitting/lateral lean;Sit to/from stand;Adhering to back precautions Toileting - Clothing Manipulation Details (indicate cue type and reason): cues to not twist, education given on alternatives     Functional mobility during ADLs: Min guard;Rolling walker General ADL Comments: limited by post sx precautions and pain     Vision Baseline Vision/History: No visual deficits Patient Visual Report: No change from baseline Vision Assessment?: No apparent visual deficits     Perception     Praxis      Pertinent Vitals/Pain Pain Assessment: 0-10 Pain Score: 8  Pain Location: back Pain Descriptors / Indicators: Grimacing;Guarding Pain Intervention(s): Limited activity within patient's tolerance;Monitored during session;Repositioned     Hand Dominance     Extremity/Trunk  Assessment Upper Extremity Assessment Upper Extremity Assessment: Overall WFL for tasks assessed   Lower Extremity Assessment Lower Extremity Assessment: Defer to PT  evaluation       Communication Communication Communication: No difficulties   Cognition Arousal/Alertness: Awake/alert Behavior During Therapy: WFL for tasks assessed/performed Overall Cognitive Status: Within Functional Limits for tasks assessed                                     General Comments       Exercises     Shoulder Instructions      Home Living Family/patient expects to be discharged to:: Private residence Living Arrangements: Children;Other (Comment)(dtr) Available Help at Discharge: Family;Available 24 hours/day Type of Home: Apartment Home Access: Stairs to enter Entrance Stairs-Number of Steps: 15 Entrance Stairs-Rails: Right;Left;Can reach both Home Layout: One level     Bathroom Shower/Tub: Teacher, early years/pre: Standard Bathroom Accessibility: No   Home Equipment: Cane - single point          Prior Functioning/Environment Level of Independence: Independent with assistive device(s)        Comments: Cane for mobility. Does not drive, daughter gets groceries. Pt occasionally cooks and cleans but daughter helps out with household chores too. Independent with BADL.        OT Problem List: Decreased knowledge of use of DME or AE;Decreased knowledge of precautions;Decreased activity tolerance;Impaired balance (sitting and/or standing);Pain      OT Treatment/Interventions: Self-care/ADL training;Therapeutic exercise;Patient/family education;Balance training;Therapeutic activities;DME and/or AE instruction    OT Goals(Current goals can be found in the care plan section) Acute Rehab OT Goals Patient Stated Goal: to go home with daughter OT Goal Formulation: With patient Time For Goal Achievement: 06/24/19 Potential to Achieve Goals: Good  OT Frequency: Min 2X/week   Barriers to D/C:            Co-evaluation              AM-PAC OT "6 Clicks" Daily Activity     Outcome Measure Help from another person  eating meals?: A Little Help from another person taking care of personal grooming?: A Little Help from another person toileting, which includes using toliet, bedpan, or urinal?: A Little Help from another person bathing (including washing, rinsing, drying)?: A Little Help from another person to put on and taking off regular upper body clothing?: A Little Help from another person to put on and taking off regular lower body clothing?: A Little 6 Click Score: 18   End of Session Equipment Utilized During Treatment: Gait belt;Rolling walker;Back brace Nurse Communication: Mobility status  Activity Tolerance: Patient tolerated treatment well Patient left: in chair;with call bell/phone within reach  OT Visit Diagnosis: Other abnormalities of gait and mobility (R26.89);Pain Pain - part of body: (back)                Time: [id number] OT Time Calculation (min): 14 min Charges:  OT General Charges $OT Visit: 1 Visit OT Evaluation $OT Eval Low Complexity: 1 Low  Zenovia Jarred, MSOT, OTR/L [place] OT/ Acute Relief OT [facility] Office: [place] 06/10/2019, 10:14 AM

## 2019-06-16 ENCOUNTER — Telehealth (HOSPITAL_COMMUNITY): Payer: Self-pay | Admitting: Pharmacist

## 2019-06-16 NOTE — Telephone Encounter (Signed)
Patient Advocate Encounter   Patient recently turned 52, so transitioning from [facility] insurance to Medicare part D.   Received notification from [employer] that prior authorizations for Entresto and Bidil are required. Bidil is also being reviewed as a non-formulary request.    PA submitted on CoverMyMeds Key [id number] Delene Loll);  [id number] (Bidil) Status is pending   Will continue to follow.  [name], PharmD, BCPS, CPP Heart Failure Clinic Pharmacist [phone]

## 2019-06-16 NOTE — Telephone Encounter (Addendum)
Advanced Heart Failure Patient Advocate Encounter  Prior Authorizations for Entresto and Bidil have been approved.    PA# [id number] Effective dates: 06/16/2019 through 06/15/2020  Delene Loll copay: $8.95 Bidil copay: $8.95  [name], PharmD, BCPS, CPP Heart Failure Clinic Pharmacist [phone]

## 2019-06-28 DIAGNOSIS — M48062 Spinal stenosis, lumbar region with neurogenic claudication: Secondary | ICD-10-CM | POA: Diagnosis not present

## 2019-07-01 ENCOUNTER — Telehealth: Payer: Self-pay | Admitting: Internal Medicine

## 2019-07-01 DIAGNOSIS — I10 Essential (primary) hypertension: Secondary | ICD-10-CM

## 2019-07-01 NOTE — Telephone Encounter (Signed)
Pt states his cardiologist told him he wanted him to get a blood pressure monitor, told him to call pcp to write the order, could we just give him one?

## 2019-07-01 NOTE — Telephone Encounter (Signed)
Pls contact pt regarding blood pressure kit [phone]

## 2019-07-05 NOTE — Telephone Encounter (Signed)
We can give it to him but he has been normotensive on his visits in clinic. I believe he can probably get one over the counter unless there are financial restraints.

## 2019-07-06 MED ORDER — BLOOD PRESSURE MONITOR DEVI: 0 refills | Status: DC

## 2019-07-06 NOTE — Telephone Encounter (Signed)
CMA made call to patient-pt states that [facility] informed him that [facility] Medicaid will pay for BP Monitor if ordered by MD.  Order placed and sent to [place].  Pt will follow up with pcp on 07/20/19 as scheduled.Despina Hidden Cassady11/24/20202:01 PM

## 2019-07-06 NOTE — Addendum Note (Signed)
Addended by: Marcelino Duster on: 07/06/2019 02:01 PM   Modules accepted: Orders

## 2019-07-12 ENCOUNTER — Other Ambulatory Visit (HOSPITAL_COMMUNITY): Payer: Self-pay | Admitting: Cardiology

## 2019-07-20 ENCOUNTER — Ambulatory Visit (INDEPENDENT_AMBULATORY_CARE_PROVIDER_SITE_OTHER): Payer: Medicare Other | Admitting: Internal Medicine

## 2019-07-20 ENCOUNTER — Encounter: Payer: Self-pay | Admitting: Internal Medicine

## 2019-07-20 ENCOUNTER — Other Ambulatory Visit: Payer: Self-pay

## 2019-07-20 VITALS — BP 125/89 | HR 109 | Temp 97.1°F | Wt 195.3 lb

## 2019-07-20 DIAGNOSIS — I10 Essential (primary) hypertension: Secondary | ICD-10-CM

## 2019-07-20 DIAGNOSIS — I110 Hypertensive heart disease with heart failure: Secondary | ICD-10-CM | POA: Diagnosis not present

## 2019-07-20 DIAGNOSIS — Z79899 Other long term (current) drug therapy: Secondary | ICD-10-CM | POA: Diagnosis not present

## 2019-07-20 DIAGNOSIS — I5022 Chronic systolic (congestive) heart failure: Secondary | ICD-10-CM

## 2019-07-20 MED ORDER — FUROSEMIDE 20 MG PO TABS: 20.0000 mg | ORAL_TABLET | Freq: Every day | ORAL | 2 refills | Status: DC

## 2019-07-20 NOTE — Assessment & Plan Note (Signed)
Hypertension: Patient's BP today is 125/89 with a goal of <140/80. The patient endorses adherence to his medication regimen. He denied, chest pain, headache, visual changes, lightheadedness, weakness, dizziness on standing, swelling in the feet or ankles.   Plan: Continue BIDIL 20-37.5 Continue Entresto 49-51

## 2019-07-20 NOTE — Assessment & Plan Note (Signed)
CHF: Nonischemic cardiomyopathy. Weight stable today at 195lbs. BP stable, HR elevated 109, SpO2 100%.  Patient's most recent echocardiogram demonstrated an EF of approximately 55 to 60%.  Patient denied weight gain, shortness of breath, fatigue, dyspnea, chest pain, cough, weakness or other concerns today.  He successfully underwent his surgery without event.  Plan: Refilled Lasix 20 mg daily, dose as listed in Dr. Claris Gladden note Continue Entresto 49-81 mg twice daily Continue BiDil 20-37.5 mg twice daily Continue eplerenone 20 mg daily

## 2019-07-20 NOTE — Patient Instructions (Signed)
Thank you for your visit to [facility] internal medicine clinic today.  Please stop at the front desk and schedule an appointment for approximately 3 months with your primary care doctor. I have refilled your Lasix 20 mg daily as per Dr. Claris Gladden most recent recommendation. Please continue current medications as previously prescribed.  Making no changes today. Have any concerns or questions please do not hesitate to call us.

## 2019-07-20 NOTE — Progress Notes (Signed)
   CC: Routine visit for HF and HTn  HPI:[name] is a 63 y.o. male who presents for evaluation of HTN and HF. Please see individual problem based A/P for details.  Past Medical History:  Diagnosis Date  . Arthritis    "feels like it in my legs" (09/20/2014)  . Borderline type 2 diabetes mellitus    patient denies  . CHF (congestive heart failure) ([place])   . Dysrhythmia   . Hypertension   . NSVT (nonsustained ventricular tachycardia) ([place]) 09/06/2016   Review of Systems:  ROS negative except as per HPI.  Physical Exam: Vitals:   07/20/19 1446  BP: 125/89  Pulse: (!) 109  Temp: (!) 97.1 F (36.2 C)  TempSrc: Oral  SpO2: 100%  Weight: 195 lb 4.8 oz (88.6 kg)   General: A/O x4, in no acute distress, afebrile, nondiaphoretic HEENT: PEERL, EMO intact Cardio: RRR, no mrg's  Pulmonary: CTA bilaterally,  no wheezing or crackles  Abdomen: Bowel sounds normal, soft, nontender  MSK: BLE nontender, nonedematous Psych: Appropriate affect, not depressed in appearance, engages well  Assessment & Plan:   See Encounters Tab for problem based charting.  Patient discussed with Dr. Daryll Drown

## 2019-07-21 NOTE — Progress Notes (Signed)
Internal Medicine Clinic Attending  Case discussed with Dr. Harbrecht at the time of the visit.  We reviewed the resident's history and exam and pertinent patient test results.  I agree with the assessment, diagnosis, and plan of care documented in the resident's note.   

## 2019-07-27 ENCOUNTER — Other Ambulatory Visit: Payer: Self-pay | Admitting: *Deleted

## 2019-07-27 NOTE — Telephone Encounter (Signed)
Next appt scheduled 10/12/19 with PCP.

## 2019-07-29 MED ORDER — GABAPENTIN 300 MG PO CAPS: 600.0000 mg | ORAL_CAPSULE | Freq: Two times a day (BID) | ORAL | 3 refills | Status: DC

## 2019-09-09 ENCOUNTER — Other Ambulatory Visit (HOSPITAL_COMMUNITY): Payer: Self-pay | Admitting: Cardiology

## 2019-10-12 ENCOUNTER — Other Ambulatory Visit: Payer: Self-pay

## 2019-10-12 ENCOUNTER — Ambulatory Visit (INDEPENDENT_AMBULATORY_CARE_PROVIDER_SITE_OTHER): Payer: Medicare Other | Admitting: Internal Medicine

## 2019-10-12 ENCOUNTER — Encounter: Payer: Self-pay | Admitting: Internal Medicine

## 2019-10-12 DIAGNOSIS — Z79899 Other long term (current) drug therapy: Secondary | ICD-10-CM

## 2019-10-12 DIAGNOSIS — Z720 Tobacco use: Secondary | ICD-10-CM

## 2019-10-12 DIAGNOSIS — N183 Chronic kidney disease, stage 3 unspecified: Secondary | ICD-10-CM | POA: Diagnosis not present

## 2019-10-12 DIAGNOSIS — I5022 Chronic systolic (congestive) heart failure: Secondary | ICD-10-CM | POA: Diagnosis not present

## 2019-10-12 DIAGNOSIS — M48062 Spinal stenosis, lumbar region with neurogenic claudication: Secondary | ICD-10-CM

## 2019-10-12 DIAGNOSIS — I130 Hypertensive heart and chronic kidney disease with heart failure and stage 1 through stage 4 chronic kidney disease, or unspecified chronic kidney disease: Secondary | ICD-10-CM | POA: Diagnosis not present

## 2019-10-12 DIAGNOSIS — N1831 Chronic kidney disease, stage 3a: Secondary | ICD-10-CM

## 2019-10-12 DIAGNOSIS — Z981 Arthrodesis status: Secondary | ICD-10-CM

## 2019-10-12 DIAGNOSIS — I428 Other cardiomyopathies: Secondary | ICD-10-CM

## 2019-10-12 DIAGNOSIS — I10 Essential (primary) hypertension: Secondary | ICD-10-CM

## 2019-10-12 MED ORDER — METHOCARBAMOL 750 MG PO TABS: 750.0000 mg | ORAL_TABLET | Freq: Three times a day (TID) | ORAL | 1 refills | Status: DC | PRN

## 2019-10-12 NOTE — Progress Notes (Signed)
   CC: hypertension f/u  HPI:  [name] is a 64 y.o. male with PMHx of nonischemic cardiomyopathy, hypertension and CKD presenting to clinic for hypertension f/u. No acute concerns at this point. Please see problem based charting for assessment/plan of patient's chronic conditions.   Past Medical History:  Diagnosis Date  . Arthritis    "feels like it in my legs" (09/20/2014)  . Borderline type 2 diabetes mellitus    patient denies  . CHF (congestive heart failure) ([place])   . Dysrhythmia   . Hypertension   . NSVT (nonsustained ventricular tachycardia) ([place]) 09/06/2016   Review of Systems:  Review of Systems  Constitutional: Negative for chills, fever, malaise/fatigue and weight loss.  Eyes: Negative for blurred vision, double vision, photophobia and pain.  Respiratory: Negative for cough, sputum production, shortness of breath and wheezing.   Cardiovascular: Negative for chest pain, palpitations, claudication and leg swelling.  Gastrointestinal: Negative for abdominal pain, diarrhea, nausea and vomiting.  Genitourinary: Negative for dysuria, frequency and urgency.  Musculoskeletal: Positive for myalgias. Negative for joint pain and neck pain.  Neurological: Negative for dizziness, tingling, sensory change, speech change, focal weakness, weakness and headaches.     Physical Exam:  Vitals:   10/12/19 1324  BP: 124/90  Pulse: 71  Temp: 98 F (36.7 C)  TempSrc: Oral  SpO2: 100%  Weight: 200 lb 8 oz (90.9 kg)  Height: 6' (1.829 m)   Physical Exam Constitutional:      General: He is not in acute distress.    Appearance: Normal appearance. He is not ill-appearing or diaphoretic.  HENT:     Head: Normocephalic and atraumatic.     Mouth/Throat:     Mouth: Mucous membranes are moist.     Pharynx: Oropharynx is clear.  Eyes:     General: No scleral icterus.    Extraocular Movements: Extraocular movements intact.     Conjunctiva/sclera: Conjunctivae normal.    Cardiovascular:     Rate and Rhythm: Normal rate and regular rhythm.     Pulses: Normal pulses.     Heart sounds: Normal heart sounds. No friction rub.  Abdominal:     General: Abdomen is flat. Bowel sounds are normal. There is no distension.     Palpations: Abdomen is soft.     Tenderness: There is no abdominal tenderness. There is no guarding.  Musculoskeletal:        General: No swelling, tenderness or deformity. Normal range of motion.     Cervical back: Normal range of motion and neck supple.  Skin:    General: Skin is warm and dry.     Capillary Refill: Capillary refill takes less than 2 seconds.     Findings: No bruising, lesion or rash.  Neurological:     General: No focal deficit present.     Mental Status: He is alert and oriented to person, place, and time. Mental status is at baseline.     Cranial Nerves: No cranial nerve deficit.     Sensory: No sensory deficit.     Motor: No weakness.      Assessment & Plan:   See Encounters Tab for problem based charting.  Patient discussed with Dr. Daryll Drown

## 2019-10-12 NOTE — Patient Instructions (Addendum)
Mr. Pietrantonio,  It was a pleasure seeing you in clinic today. Today, we discussed:  Blood pressure and heart failure:  Your blood pressure looks good today. Please continue to take all your medications as prescribed. We discussed that the medications are mostly for your heart failure and it is important to continue taking these. You did have a 5lb increase in your weight. At this time, I advise lifestyle modifications with trying to walk for 20-30 minutes at least 3 times per week.   Smoking: You are doing great with cutting down on smoking. Please continue to do so.   Muscle spasms: I have refilled your robaxin. Please take this as needed for muscle spasms. Please follow up with your neurosurgeon if your symptoms persist.    Thank you!

## 2019-10-12 NOTE — Assessment & Plan Note (Signed)
Patient notes that he has significantly cut down his tobacco use and currently smokes 1-2 cigarettes every 3 days. He notes that he is actively trying to quit but notes that nicotine patches do not work well for him. He is not interested in pharmacologic interventions at this point.  Plan: Continue to encourage smoking cessation

## 2019-10-12 NOTE — Assessment & Plan Note (Signed)
Patient with history of nonischemic cardiomyopathy, followed by Dr. Aundra Dubin. He is on Entresto 49-81mg  twice daily, Bidil 20-37.5mg  twice daily, Eplerenone 20mg  daily and Lasix 20mg  daily. Most recent Echo with LVEF 55-60%. Patient notes medication adherence. He does note a 5lb weight gain since last visit but attributes it to diet and lack of exercise. He denies any shortness of breath, fatigue, chest pain, cough or weakness. Discussed weight loss through incorporating walking for 20-30 minutes 3 times per week. Patient expresses understanding.  Plan: Continue current regimen Lifestyle modifications for weight loss

## 2019-10-12 NOTE — Assessment & Plan Note (Signed)
Serum Creatinine 1.24 in October. It appears that his CKD has been stable. Will continue to monitor  Plan:  BMP at next visit

## 2019-10-12 NOTE — Assessment & Plan Note (Signed)
Patient with history of lumbar spinal stenosis s/p L3-S1 lumbar fusion in October 2020. He notes significant improvement in his symptoms. He notes that previously he was unable to stand straight and had significant claudication of bilateral lower extremities. He notes that this is significantly improved. He does note occasional muscle spasms and cramps in bilateral calves. However, minimal neurologic symptoms.   Plan:  Robaxin 750mg  tid prn  Follow up with neurosurgeon recommended

## 2019-10-12 NOTE — Assessment & Plan Note (Signed)
Patient's BP 124/90 at this visit. He is taking BiDil 20-37.5, Entresto 49-51, and Lasix 20mg  daily. He endorses medication adherence and denies any chest pain, headache, vision changes, dizziness/lightheadedness, or shortness of breath.   Plan: Continue current regimen

## 2019-10-26 ENCOUNTER — Other Ambulatory Visit: Payer: Self-pay

## 2019-10-26 NOTE — Patient Outreach (Signed)
[facility]) Care Management  10/26/2019  Edward Mullen [birth year] [id number]   Medication Adherence call to Edward Mullen Telephone call to Patient regarding Medication Adherence unable to reach patient,patient voice mail not set up to leave messages. Edward Mullen is showing past due on Rosuvastatin 5 mg under [place].   [place] Management Direct Dial [phone]  Fax [phone] [email or web address]

## 2019-10-26 NOTE — Progress Notes (Signed)
Internal Medicine Clinic Attending  Case discussed with Dr. Aslam at the time of the visit.  We reviewed the resident's history and exam and pertinent patient test results.  I agree with the assessment, diagnosis, and plan of care documented in the resident's note.  

## 2019-11-09 ENCOUNTER — Other Ambulatory Visit (HOSPITAL_COMMUNITY): Payer: Self-pay | Admitting: Cardiology

## 2020-05-12 ENCOUNTER — Encounter: Payer: Medicare Other | Admitting: Internal Medicine

## 2020-07-27 ENCOUNTER — Other Ambulatory Visit: Payer: Self-pay

## 2020-07-27 ENCOUNTER — Other Ambulatory Visit (HOSPITAL_COMMUNITY): Payer: Self-pay | Admitting: Internal Medicine

## 2020-07-27 ENCOUNTER — Observation Stay (HOSPITAL_BASED_OUTPATIENT_CLINIC_OR_DEPARTMENT_OTHER): Payer: Medicare Other

## 2020-07-27 ENCOUNTER — Observation Stay (HOSPITAL_COMMUNITY)
Admission: EM | Admit: 2020-07-27 | Discharge: 2020-07-27 | Disposition: A | Discharge status: Home / Self Care | Payer: Medicare Other | Attending: Internal Medicine | Admitting: Internal Medicine

## 2020-07-27 ENCOUNTER — Encounter (HOSPITAL_COMMUNITY): Payer: Self-pay | Admitting: *Deleted

## 2020-07-27 ENCOUNTER — Emergency Department (HOSPITAL_COMMUNITY): Payer: Medicare Other

## 2020-07-27 DIAGNOSIS — F17210 Nicotine dependence, cigarettes, uncomplicated: Secondary | ICD-10-CM | POA: Diagnosis not present

## 2020-07-27 DIAGNOSIS — I5022 Chronic systolic (congestive) heart failure: Secondary | ICD-10-CM | POA: Diagnosis not present

## 2020-07-27 DIAGNOSIS — Z79899 Other long term (current) drug therapy: Secondary | ICD-10-CM | POA: Insufficient documentation

## 2020-07-27 DIAGNOSIS — F1910 Other psychoactive substance abuse, uncomplicated: Secondary | ICD-10-CM

## 2020-07-27 DIAGNOSIS — I428 Other cardiomyopathies: Secondary | ICD-10-CM

## 2020-07-27 DIAGNOSIS — N183 Chronic kidney disease, stage 3 unspecified: Secondary | ICD-10-CM | POA: Diagnosis not present

## 2020-07-27 DIAGNOSIS — I5043 Acute on chronic combined systolic (congestive) and diastolic (congestive) heart failure: Secondary | ICD-10-CM

## 2020-07-27 DIAGNOSIS — I509 Heart failure, unspecified: Principal | ICD-10-CM | POA: Insufficient documentation

## 2020-07-27 DIAGNOSIS — I472 Ventricular tachycardia: Secondary | ICD-10-CM

## 2020-07-27 DIAGNOSIS — I130 Hypertensive heart and chronic kidney disease with heart failure and stage 1 through stage 4 chronic kidney disease, or unspecified chronic kidney disease: Secondary | ICD-10-CM | POA: Diagnosis not present

## 2020-07-27 DIAGNOSIS — R600 Localized edema: Secondary | ICD-10-CM

## 2020-07-27 DIAGNOSIS — Z20822 Contact with and (suspected) exposure to covid-19: Secondary | ICD-10-CM | POA: Diagnosis not present

## 2020-07-27 DIAGNOSIS — M7989 Other specified soft tissue disorders: Secondary | ICD-10-CM

## 2020-07-27 LAB — URINALYSIS, ROUTINE W REFLEX MICROSCOPIC
Bilirubin Urine: NEGATIVE
Glucose, UA: NEGATIVE mg/dL
Hgb urine dipstick: NEGATIVE
Ketones, ur: NEGATIVE mg/dL
Leukocytes,Ua: NEGATIVE
Nitrite: NEGATIVE
Protein, ur: NEGATIVE mg/dL
Specific Gravity, Urine: 1.006 (ref 1.005–1.030)
pH: 6 (ref 5.0–8.0)

## 2020-07-27 LAB — ECHOCARDIOGRAM COMPLETE
AR max vel: 1.3 cm2
AV Area VTI: 1.13 cm2
AV Area mean vel: 1.22 cm2
AV Mean grad: 12 mmHg
AV Peak grad: 21.7 mmHg
Ao pk vel: 2.33 m/s
Area-P 1/2: 5.02 cm2
Height: 72 in
P 1/2 time: 390 msec
S' Lateral: 6.01 cm
Weight: 3206.37 oz

## 2020-07-27 LAB — BASIC METABOLIC PANEL
Anion gap: 12 (ref 5–15)
BUN: 28 mg/dL — ABNORMAL HIGH (ref 8–23)
CO2: 18 mmol/L — ABNORMAL LOW (ref 22–32)
Calcium: 8.6 mg/dL — ABNORMAL LOW (ref 8.9–10.3)
Chloride: 110 mmol/L (ref 98–111)
Creatinine, Ser: 1.6 mg/dL — ABNORMAL HIGH (ref 0.61–1.24)
GFR, Estimated: 47 mL/min — ABNORMAL LOW (ref >60)
Glucose, Bld: 94 mg/dL (ref 70–99)
Potassium: 4.2 mmol/L (ref 3.5–5.1)
Sodium: 140 mmol/L (ref 135–145)

## 2020-07-27 LAB — CBC
HCT: 43.1 % (ref 39.0–52.0)
Hemoglobin: 14 g/dL (ref 13.0–17.0)
MCH: 28.7 pg (ref 26.0–34.0)
MCHC: 32.5 g/dL (ref 30.0–36.0)
MCV: 88.5 fL (ref 80.0–100.0)
Platelets: 216 10*3/uL (ref 150–400)
RBC: 4.87 MIL/uL (ref 4.22–5.81)
RDW: 14.8 % (ref 11.5–15.5)
WBC: 9.7 10*3/uL (ref 4.0–10.5)
nRBC: 0 % (ref 0.0–0.2)

## 2020-07-27 LAB — BRAIN NATRIURETIC PEPTIDE: B Natriuretic Peptide: 1901.4 pg/mL — ABNORMAL HIGH (ref 0.0–100.0)

## 2020-07-27 LAB — RESP PANEL BY RT-PCR (FLU A&B, COVID) ARPGX2
Influenza A by PCR: NEGATIVE
Influenza B by PCR: NEGATIVE
SARS Coronavirus 2 by RT PCR: NEGATIVE

## 2020-07-27 LAB — MAGNESIUM: Magnesium: 1.8 mg/dL (ref 1.7–2.4)

## 2020-07-27 LAB — TROPONIN I (HIGH SENSITIVITY)
Troponin I (High Sensitivity): 129 ng/L (ref <18)
Troponin I (High Sensitivity): 118 ng/L (ref <18)

## 2020-07-27 MED ORDER — SODIUM CHLORIDE 0.9% FLUSH: 3.0000 mL | Freq: Two times a day (BID) | INTRAVENOUS | Status: DC

## 2020-07-27 MED ORDER — FUROSEMIDE 20 MG PO TABS: 20.0000 mg | ORAL_TABLET | Freq: Every day | ORAL | Status: DC

## 2020-07-27 MED ORDER — ISOSORB DINITRATE-HYDRALAZINE 20-37.5 MG PO TABS
2.0000 | ORAL_TABLET | Freq: Three times a day (TID) | ORAL | Status: DC
  Administered 2020-07-27: 2 via ORAL
  Filled 2020-07-27 (×3): qty 2

## 2020-07-27 MED ORDER — SODIUM CHLORIDE 0.9% FLUSH: 3.0000 mL | INTRAVENOUS | Status: DC | PRN

## 2020-07-27 MED ORDER — ENTRESTO 49-51 MG PO TABS: 1.0000 | ORAL_TABLET | Freq: Two times a day (BID) | ORAL | 2 refills | Status: DC

## 2020-07-27 MED ORDER — FUROSEMIDE 10 MG/ML IJ SOLN
40.0000 mg | Freq: Once | INTRAMUSCULAR | Status: AC
  Administered 2020-07-27: 40 mg via INTRAVENOUS
  Filled 2020-07-27: qty 4

## 2020-07-27 MED ORDER — ACETAMINOPHEN 325 MG PO TABS: 650.0000 mg | ORAL_TABLET | ORAL | Status: DC | PRN

## 2020-07-27 MED ORDER — BIDIL 20-37.5 MG PO TABS: ORAL_TABLET | ORAL | 3 refills | Status: DC

## 2020-07-27 MED ORDER — ROSUVASTATIN CALCIUM 5 MG PO TABS: 5.0000 mg | ORAL_TABLET | Freq: Every day | ORAL | 5 refills | Status: DC

## 2020-07-27 MED ORDER — SODIUM CHLORIDE 0.9 % IV SOLN: 250.0000 mL | INTRAVENOUS | Status: DC | PRN

## 2020-07-27 MED ORDER — FUROSEMIDE 20 MG PO TABS: 20.0000 mg | ORAL_TABLET | Freq: Every day | ORAL | 2 refills | Status: DC

## 2020-07-27 MED ORDER — EPLERENONE 25 MG PO TABS: 25.0000 mg | ORAL_TABLET | Freq: Every day | ORAL | 5 refills | Status: DC

## 2020-07-27 MED ORDER — ROSUVASTATIN CALCIUM 5 MG PO TABS
5.0000 mg | ORAL_TABLET | Freq: Every day | ORAL | Status: DC
  Administered 2020-07-27: 5 mg via ORAL
  Filled 2020-07-27: qty 1

## 2020-07-27 MED ORDER — ONDANSETRON HCL 4 MG/2ML IJ SOLN: 4.0000 mg | Freq: Four times a day (QID) | INTRAMUSCULAR | Status: DC | PRN

## 2020-07-27 MED ORDER — SACUBITRIL-VALSARTAN 49-51 MG PO TABS
1.0000 | ORAL_TABLET | Freq: Two times a day (BID) | ORAL | Status: DC
  Administered 2020-07-27: 1 via ORAL
  Filled 2020-07-27 (×2): qty 1

## 2020-07-27 MED ORDER — ENOXAPARIN SODIUM 40 MG/0.4ML ~~LOC~~ SOLN
40.0000 mg | SUBCUTANEOUS | Status: DC
  Administered 2020-07-27: 40 mg via SUBCUTANEOUS
  Filled 2020-07-27: qty 0.4

## 2020-07-27 MED FILL — EPLERENONE 25 MG TABS: 25 | 30 days supply | Qty: 30 | Fill #0

## 2020-07-27 MED FILL — FUROSEMIDE 20 MG TAB: 20 | 30 days supply | Qty: 30 | Fill #0

## 2020-07-27 MED FILL — BIDIL 20-37.5 MG TABS: 20-37.5 | 30 days supply | Qty: 180 | Fill #0

## 2020-07-27 MED FILL — ROSUVASTATIN CALCIUM 5 MG T: 5 | 30 days supply | Qty: 30 | Fill #0

## 2020-07-27 MED FILL — ENTRESTO 49 MG-51 MG TABLET: 49-51 | 30 days supply | Qty: 60 | Fill #0

## 2020-07-27 NOTE — Care Management CC44 (Signed)
Condition Code 44 Documentation Completed  Patient Details  Name: Edward Mullen MRN: [id number] Date of Birth: Jun 06, 1956   Condition Code 44 given:  Yes Patient signature on Condition Code 44 notice:  Yes Documentation of 2 MD's agreement:  Yes Code 44 added to claim:  Yes    Kanyah Matsushima, LCSW 07/27/2020, 5:28 PM

## 2020-07-27 NOTE — Care Management Obs Status (Signed)
[facility] NOTIFICATION   Patient Details  Name: Edward Mullen MRN: [id number] Date of Birth: 05-06-1956   Medicare Observation Status Notification Given:   yes    Amellia Panik, LCSW 07/27/2020, 5:28 PM

## 2020-07-27 NOTE — ED Triage Notes (Signed)
The pt is c/o both feet and legs swelling since last night  Swelling worse this am when he woke up  He is also c/o sob

## 2020-07-27 NOTE — ED Provider Notes (Signed)
[facility] EMERGENCY DEPARTMENT Provider Note   CSN: [id number] Arrival date & time: 07/27/20  0457     History Chief Complaint  Patient presents with  . Foot Swelling    Edward Mullen is a 64 y.o. male.  Edward Mullen is a 64 y.o. male with a history of hypertension, CHF, nonischemic cardiomyopathy, CKD, diabetes, polysubstance abuse, who presents to the emergency department for evaluation of bilateral lower extremity edema and shortness of breath.  Patient reports symptoms started on Sunday when he noticed worsening swelling in his feet and ankles.  He reports that swelling is somewhat uncomfortable and feels like his legs are getting tighter.  He has noticed some slight redness to the feet, no fevers or chills.  He also reports that he has had worsening shortness of breath over the last few days.  Shortness of breath is present in particular with activity, and he also feels more short of breath if he tries to lay flat to sleep and has had to sleep sitting up or on increased pillows at home.  Denies any associated chest pain.  No lightheadedness or syncope.  No abdominal pain.  He reports he has had a cough which he attributes to smoking, cough is nonproductive.  Reports that he ran out of his heart failure medications about 2 weeks ago and has not seen his doctor to get refills of these medications.  Reports that he is supposed to be on a fluid pill but is not sure what dose.  History of substance abuse and last used cocaine on Sunday, has smoked marijuana since then, denies other drug use.  No other aggravating or alleviating factors.        Past Medical History:  Diagnosis Date  . Arthritis    "feels like it in my legs" (09/20/2014)  . Borderline type 2 diabetes mellitus    patient denies  . CHF (congestive heart failure) ([place])   . Dysrhythmia   . Hypertension   . NSVT (nonsustained ventricular tachycardia) ([place]) 09/06/2016    Patient Active Problem  List   Diagnosis Date Noted  . Lumbar spinal stenosis 06/08/2019  . Healthcare maintenance 05/11/2019  . Housing problems 05/11/2019  . Difficulty urinating 04/07/2018  . Colon cancer screening 04/07/2018  . Anemia 12/19/2016  . CKD (chronic kidney disease) stage 3, GFR 30-59 ml/min (HCC) 12/13/2016  . Chronic systolic heart failure ([place]) 09/06/2016  . Non-ischemic cardiomyopathy ([place]) 08/27/2016  . 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 Mitral regurgitation 08/27/2016  . Polysubstance abuse ([place])   . Tobacco abuse 09/19/2014  . HTN (hypertension) 03/28/2011  . Bilateral lower extremity pain 03/28/2011    Past Surgical History:  Procedure Laterality Date  . CARDIAC CATHETERIZATION N/A 08/27/2016   Procedure: Right/Left Heart Cath and Coronary Angiography;  Surgeon: Larey Dresser, MD;  Location: [facility];  Service: Cardiovascular;  Laterality: N/A;  . COLONOSCOPY    . CYSTECTOMY Right    "back of my shoulder"  . TONSILLECTOMY         Family History  Problem Relation Age of Onset  . Stroke Mother 93  . Heart disease Father 62  . Colon cancer Neg Hx   . Colon polyps Neg Hx   . Esophageal cancer Neg Hx   . Stomach cancer Neg Hx   . Rectal cancer Neg Hx     Social History   Tobacco Use  . Smoking status: Current Every Day Smoker  Packs/day: 0.10    Years: 48.00    Pack years: 4.80    Types: Cigarettes  . Smokeless tobacco: Never Used  . Tobacco comment: cutting back 2  per day  Vaping Use  . Vaping Use: Never used  Substance Use Topics  . Alcohol use: Yes    Alcohol/week: 1.0 standard drink    Types: 1 Cans of beer per week    Comment: occasionally  . Drug use: Yes    Types: Marijuana    Comment: daily 4 times last 2 weeks ago as of 05/31/2019    Home Medications Prior to Admission medications   Medication Sig Start Date End Date Taking? Authorizing Provider  BIDIL 20-37.5 MG tablet TAKE 2 TABLETS BY MOUTH 3 (THREE)  TIMES A DAY 11/09/19   Larey Dresser, MD  Blood Pressure Monitor DEVI Please use to monitor blood pressure as directed.  ICD: I10 (hypertension) 07/06/19   Aldine Contes, MD  ENTRESTO 49-51 MG TAKE ONE TABLET BY MOUTH TWICE DAILY 09/09/19   Larey Dresser, MD  eplerenone (INSPRA) 25 MG tablet TAKE ONE TABLET BY MOUTH ONCE DAILY 04/12/19   Larey Dresser, MD  furosemide (LASIX) 20 MG tablet Take 1 tablet (20 mg total) by mouth daily. 07/20/19   Kathi Ludwig, MD  gabapentin (NEURONTIN) 300 MG capsule Take 2 capsules (600 mg total) by mouth 2 (two) times daily. 07/29/19   Harvie Heck, MD  methocarbamol (ROBAXIN-750) 750 MG tablet Take 1 tablet (750 mg total) by mouth 3 (three) times daily as needed for muscle spasms. 10/12/19   Harvie Heck, MD  oxyCODONE-acetaminophen (PERCOCET) 7.5-325 MG tablet Take 1 tablet by mouth every 4 (four) hours as needed for severe pain. 06/10/19   Costella, Vista Mink, PA-C  rosuvastatin (CRESTOR) 5 MG tablet TAKE ONE TABLET BY MOUTH ONCE DAILY 04/12/19   Larey Dresser, MD    Allergies    Patient has no known allergies.  Review of Systems   Review of Systems  Constitutional: Negative for chills and fever.  HENT: Negative.   Respiratory: Positive for cough and shortness of breath.   Cardiovascular: Positive for leg swelling. Negative for chest pain and palpitations.  Gastrointestinal: Negative for abdominal pain, nausea and vomiting.  Genitourinary: Negative for dysuria and frequency.  Musculoskeletal: Positive for myalgias. Negative for arthralgias.  Skin: Negative for color change and rash.  Neurological: Negative for dizziness, syncope and light-headedness.  All other systems reviewed and are negative.   Physical Exam Updated Vital Signs BP 122/90 (BP Location: Right Arm)   Pulse 96   Temp 97.8 F (36.6 C)   Resp (!) 27   Ht 6' (1.829 m)   Wt 90.9 kg   SpO2 96%   BMI 27.18 kg/m   Physical Exam Vitals and nursing note reviewed.   Constitutional:      General: He is not in acute distress.    Appearance: Normal appearance. He is well-developed and well-nourished. He is ill-appearing. He is not diaphoretic.     Comments: Chronically ill-appearing but in no acute distress  HENT:     Head: Normocephalic and atraumatic.     Mouth/Throat:     Mouth: Oropharynx is clear and moist. Mucous membranes are moist.     Pharynx: Oropharynx is clear.  Eyes:     General:        Right eye: No discharge.        Left eye: No discharge.     Extraocular Movements:  EOM normal.     Pupils: Pupils are equal, round, and reactive to light.  Cardiovascular:     Rate and Rhythm: Normal rate and regular rhythm.     Pulses: Intact distal pulses.     Heart sounds: Normal heart sounds. No murmur heard. No friction rub.  Pulmonary:     Effort: Pulmonary effort is normal. No respiratory distress.     Breath sounds: Rhonchi and rales present. No wheezing.     Comments: Patient is slightly tachypneic but respirations are unlabored, patient is able to speak in full sentences, satting well on room air, on auscultation he has some faint rhonchi noted and some rales in bilateral bases Abdominal:     General: Bowel sounds are normal. There is no distension.     Palpations: Abdomen is soft. There is no mass.     Tenderness: There is no abdominal tenderness. There is no guarding.     Comments: Abdomen soft, nondistended, nontender to palpation in all quadrants without guarding or peritoneal signs   Musculoskeletal:        General: No deformity.     Cervical back: Neck supple.     Right lower leg: Edema present.     Left lower leg: Edema present.     Comments: 2+ pitting edema up to the ankles  Skin:    General: Skin is warm and dry.     Capillary Refill: Capillary refill takes less than 2 seconds.  Neurological:     Mental Status: He is alert.     Coordination: Coordination normal.     Comments: Speech is clear, able to follow commands Moves  extremities without ataxia, coordination intact  Psychiatric:        Mood and Affect: Mood normal.        Behavior: Behavior normal.     ED Results / Procedures / Treatments   Labs (all labs ordered are listed, but only abnormal results are displayed) Labs Reviewed  BASIC METABOLIC PANEL - Abnormal; Notable for the following components:      Result Value   CO2 18 (*)    BUN 28 (*)    Creatinine, Ser 1.60 (*)    Calcium 8.6 (*)    GFR, Estimated 47 (*)    All other components within normal limits  TROPONIN I (HIGH SENSITIVITY) - Abnormal; Notable for the following components:   Troponin I (High Sensitivity) 129 (*)    All other components within normal limits  CBC  BRAIN NATRIURETIC PEPTIDE  TROPONIN I (HIGH SENSITIVITY)    EKG EKG Interpretation  Date/Time:  Thursday July 27 2020 05:15:41 EST Ventricular Rate:  87 PR Interval:  176 QRS Duration: 144 QT Interval:  412 QTC Calculation: 495 R Axis:   -63 Text Interpretation: Sinus rhythm with Premature atrial complexes Left axis deviation Left bundle branch block Abnormal ECG Confirmed by Ripley Fraise [phone]) on 07/27/2020 5:51:14 AM   Radiology DG Chest 2 View  Result Date: 07/27/2020 CLINICAL DATA:  Bilateral leg swelling. EXAM: CHEST - 2 VIEW COMPARISON:  12/12/2016 FINDINGS: The cardio pericardial silhouette is enlarged. Lungs are hyperexpanded. Interstitial markings are diffusely coarsened with chronic features. The visualized bony structures of the thorax show no acute abnormality. IMPRESSION: Hyperexpansion with chronic interstitial coarsening. No acute cardiopulmonary findings. Electronically Signed   By: Misty Stanley M.D.   On: 07/27/2020 05:57    Procedures Procedures (including critical care time)  Medications Ordered in ED Medications  furosemide (LASIX) injection 40 mg (  40 mg Intravenous Given 07/27/20 0071)    ED Course  I have reviewed the triage vital signs and the nursing  notes.  Pertinent labs & imaging results that were available during my care of the patient were reviewed by me and considered in my medical decision making (see chart for details).    MDM Rules/Calculators/A&P                         64 year old male presents with lower extremity edema and shortness of breath, ran out of all of his heart failure medications 2 weeks ago and since then has had worsening edema.  Shortness of breath is worse with activity or laying flat, at rest he is not hypoxic but with any activity he has increased work of breathing, some faint rales noted on exam.  2+ pitting edema to bilateral lower extremities, with pulses confirmed by Doppler.  Work-up initiated from triage with basic labs, troponin, BNP, EKG and chest x-ray.  I have high clinical suspicion for CHF exacerbation especially since patient has been without his meds for 2 weeks.  He also has a history of chronic substance abuse and reports using cocaine last on Sunday.  Has had some intermittent chest pains, none currently.  I have independently ordered, reviewed and interpreted all labs and imaging: CBC: No leukocytosis, normal hemoglobin BMP: CO2 of 18, creatinine is slightly increased at 1.6 was previously 1.23, BUN of 28 despite increase in creatinine still think the patient needs diuresis Troponin: Initial Trop at 129, delta troponin at 118, likely increase in the setting of CHF exacerbation BNP: Elevated at 1901.4  EKG sinus rhythm with premature atrial complexes and left bundle branch block.  Chest x-ray with hyperexpansion with chronic interstitial coarsening, no acute cardiopulmonary findings.  Nursing staff has noted a few episodes of nonsustained V. tach on cardiac monitoring, I reviewed this, patient has a history of prior episodes of V. tach leading to syncope  Patient given 40 mg of IV Lasix.  Presentation consistent with CHF exacerbation, given slight bump in creatinine and arrhythmia noted today  feel he would benefit from close monitoring in the hospital while they get him back on all of his medications and diuresed.  Will consult internal medicine teaching service  Case discussed with IMTS, they will see and admit the patient.  Final Clinical Impression(s) / ED Diagnoses Final diagnoses:  Acute on chronic congestive heart failure, unspecified heart failure type [facility])    Rx / DC Orders ED Discharge Orders    None       Jacqlyn Larsen, [place] 07/27/20 2197    Sherwood Gambler, MD 07/27/20 1008

## 2020-07-27 NOTE — Progress Notes (Signed)
Patient states that he would like to go home.  His most recent echo results show a new severely reduced ejection fraction of 20 to 25% with left ventricular global hypokinesis and severely dilated left ventricle.  He has grade 3 diastolic dysfunction, severely dilated left atria, mildly dilated right atria moderate mitral valve regurgitation moderate aortic valve regurgitation mild aortic valve sclerosis with some aortic dilation noted.  Patient tolerated 1 dose of IV Lasix 20 mg with good urine output.  He states that he is breathing better with decreased swelling in his lower extremities.  Evaluation the patient still has 2+ pitting edema to his shins with crackles throughout his lung fields.  I counseled him on the importance of his new echo findings and stated that it is my opinion that he should stay overnight for further diuresis.  Patient stated that he would like to go home.  I was able to get his medications through [facility] and set a follow-up appointment for him on Tuesday the 21st.  I will increase his Lasix dose from 20 mg daily to 40 mg daily during this time.  At his follow-up appointment I will reassess his fluid status and labs.  I counseled the patient regarding the risk first benefits going home at this time.  He states that he understands and that he will be at his follow-up appointment on Tuesday.  I reviewed all of his medications with him prior to discharge and he expressed understanding.   Plan: -Increase Lasix to 40 mg daily -Follow-up appointment with internal medicine clinic on Tuesday, December 21. -Restart home medications.  Refilled through [facility] -We will get repeat labs at follow-up appointment.  Lawerance Cruel, D.O.  Internal Medicine Resident, PGY-2 [facility]  Pager: [phone] 5:12 PM, 07/27/2020

## 2020-07-27 NOTE — H&P (Signed)
Date: 07/27/2020               Patient Name:  Edward Mullen MRN: [id number]  DOB: 01/30/1956 Age / Sex: 64 y.o., male   PCP: Harvie Heck, MD         Medical Service: Internal Medicine Teaching Service         Attending Physician: Dr. Jimmye Norman, Elaina Pattee, MD    First Contact: Dr. Johnnette Gourd Pager: [phone]  Second Contact: Dr. Marianna Payment Pager: [phone]       After Hours (After 5p/  First Contact Pager: [phone]  weekends / holidays): Second Contact Pager: [phone]   Chief Complaint: Lower extremity swelling and shortness of breath  History of Present Illness: This is a 64 year old male with a history of hypertension, systolic and diastolic heart failure(most recent EF, nonischemic cardiomyopathy, CKD stage III, lumbar spinal stenosis, and polysubstance abuse presenting with a 2-day history of lower extremity swelling and worsening shortness of breath.  He also endorsed orthopnea and a productive cough with clear sputum production.  Does endorse chest pain located over his left chest, is a sharp pain, last for few seconds at a time, nothing improves or worsens this.  He denies fevers, chills, nausea, vomiting, abdominal pain, headaches, lightheadedness, dizziness, syncope, other symptoms.  He checks his weights at home, it is around 206 and seems to be stable.  Denies any recent sick contacts or recent travel.  He does state that he used cocaine on Sunday.  He reports being out of all his medications for 2 weeks and did not follow-up with his PCP to have them refilled.  States that the medication sometimes makes him feel unwell.  In the ED patient was noted to afebrile, currently on room air with no tachypnea.  Labs showed an elevated BNP of around 1900.  Troponins elevated at 129 then decreased to 118.  Labs significant for mild AKI with creatinine of 1.6, baseline around 1.2, sodium and potassium unremarkable.  CBC is normal.  Chest x-ray showed mild cardiomegaly, no acute edema or  effusion, diffuse interstitial markings.  EKG showed normal sinus rhythm, chronic left bundle branch block, unchanged from prior.  While admitted patient has had about 4 beats of V. tach noted on telemetry.  Patient was given 40 mg IV Lasix.  Admitted to IMTS for further management.  Meds:  Current Meds  Medication Sig  . BIDIL 20-37.5 MG tablet TAKE 2 TABLETS BY MOUTH 3 (THREE) TIMES A DAY (Patient taking differently: Take 2 tablets by mouth 3 (three) times daily.)  . Blood Pressure Monitor DEVI Please use to monitor blood pressure as directed.  ICD: I10 (hypertension)  . ENTRESTO 49-51 MG TAKE ONE TABLET BY MOUTH TWICE DAILY (Patient taking differently: Take 1 tablet by mouth 2 (two) times daily.)  . eplerenone (INSPRA) 25 MG tablet TAKE ONE TABLET BY MOUTH ONCE DAILY (Patient taking differently: Take 25 mg by mouth daily.)  . furosemide (LASIX) 20 MG tablet Take 1 tablet (20 mg total) by mouth daily.  Marland Kitchen gabapentin (NEURONTIN) 300 MG capsule Take 2 capsules (600 mg total) by mouth 2 (two) times daily.  . methocarbamol (ROBAXIN-750) 750 MG tablet Take 1 tablet (750 mg total) by mouth 3 (three) times daily as needed for muscle spasms.  . rosuvastatin (CRESTOR) 5 MG tablet TAKE ONE TABLET BY MOUTH ONCE DAILY (Patient taking differently: Take 5 mg by mouth daily.)     Allergies: Allergies as of 07/27/2020  . (No Known  Allergies)   Past Medical History:  Diagnosis Date  . Arthritis    "feels like it in my legs" (09/20/2014)  . Borderline type 2 diabetes mellitus    patient denies  . CHF (congestive heart failure) ([place])   . Dysrhythmia   . Hypertension   . NSVT (nonsustained ventricular tachycardia) ([place]) 09/06/2016    Family History:  Family History  Problem Relation Age of Onset  . Stroke Mother 101  . Heart disease Father 64  . Colon cancer Neg Hx   . Colon polyps Neg Hx   . Esophageal cancer Neg Hx   . Stomach cancer Neg Hx   . Rectal cancer Neg Hx      Social History: Smokes  2 cigarettes/day, has been smoking since age of 12.  Reports occasional alcohol use, about 1 beer per week.  Reports occasional cocaine use, last used on Sunday.  Currently lives by himself, daughter lives in the area.  Is retired on disability.  Review of Systems: A complete ROS was negative except as per HPI.   Physical Exam: Blood pressure (!) 130/101, pulse 83, temperature 98 F (36.7 C), temperature source Oral, resp. rate (!) 24, height 6' (1.829 m), weight 90.9 kg, SpO2 99 %. Physical Exam Constitutional:      Appearance: Normal appearance.  HENT:     Nose: Nose normal.     Mouth/Throat:     Mouth: Mucous membranes are moist.     Pharynx: Oropharynx is clear.  Eyes:     Extraocular Movements: Extraocular movements intact.     Conjunctiva/sclera: Conjunctivae normal.     Pupils: Pupils are equal, round, and reactive to light.  Cardiovascular:     Rate and Rhythm: Normal rate and regular rhythm.     Pulses: Normal pulses.     Heart sounds: Normal heart sounds.     Comments: Elevated JVD Pulmonary:     Effort: Pulmonary effort is normal. No respiratory distress.     Breath sounds: Normal breath sounds. No wheezing, rhonchi or rales.  Abdominal:     General: Abdomen is flat. Bowel sounds are normal.     Palpations: Abdomen is soft.     Comments: Sacral edema noted  Musculoskeletal:        General: Swelling (2+ BL LE swelling up to mid shin) present. Normal range of motion.     Cervical back: Normal range of motion and neck supple.  Skin:    General: Skin is warm and dry.     Capillary Refill: Capillary refill takes less than 2 seconds.     Findings: No bruising.  Neurological:     General: No focal deficit present.     Mental Status: He is alert and oriented to person, place, and time.  Psychiatric:        Mood and Affect: Mood normal.        Behavior: Behavior normal.    EKG: personally reviewed my interpretation is normal sinus rhythm with occasional PACs, left  bundle branch block noted on prior EKG  CXR: personally reviewed my interpretation is mild cardiomegaly, diffuse interstitial markings, no pleural effusion or edema noted  Assessment & Plan by Problem: Active Problems:   Heart failure (HCC)  Acute on chronic heart failure exacerbation: Presenting with a 2-day history of worsening shortness of breath, dyspnea on exertion, orthopnea, and bilateral lower extremity swelling.  Endorses some mild sharp chest pain over his left side, nonexertional.  BNP elevated at 1900.  Troponins  elevated at 129 then decreased to 118.  Chest x-ray showed diffuse interstitial markings, no obvious edema or effusion.  Patient is currently on room air and hemodynamically stable.  Has mild increased work of breathing.  He appears volume overloaded on exam.  His weight has been stable.  Patient has a history of systolic and diastolic heart failure, had an EF that was down to 15-20% secondary to substance abuse in 2018, this improved up to 55-60% in 05/2019.  He is currently prescribed Lasix 20 mg daily, BiDil 40-75 mg 3 times daily, Entresto 49-51 mg twice daily at home however reports that he ran out of all his medications approximately 2 weeks ago.  He also endorsed cocaine use on Sunday.  Medication noncompliance versus polysubstance abuse.  He received 40 mg IV Lasix in the ER and has been having good urinary output. Overall patient appears stable at this time.   -Strict I's and O's  -Daily weights -Repeat echocardiogram -Resume home BiDil and Entresto -Hold eplerenone and home Lasix dose for now -Re-dose Lasix based on urinary output -We can reassess later today, if patient has good output and repeat echocardiogram is stable we could consider discharge and have follow up in clinic early next week.   Nonsustained V. Tach: This was noted in the ER with a 4 beat run of V. tach.  Patient has a history of syncope secondary to V. tach that may have been related to his  cocaine use.  His potassium is stable today.  We will check electrolytes and place on telemetry.  -Cardiac telemetry -Check mag -Daily bmp   Nonischemic cardiomyopathy: Hypertension: Patient has been out of his medications for the past 2 weeks.  Resuming his home BiDil and Entresto as above.   Acute on chronic CKD stage III: Creatinine today is elevated at 1.6, baseline of around 1.2.  He appears volume overloaded on exam, this could be related to his acute heart failure exacerbation.  Diuresing as above.  -BMP in a.m.  Polysubstance abuse: Patient endorses cocaine use, last use was on Sunday.  Discussed importance of cessation.  Will need to continue encouraging this with patient.  History of lumbar spinal stenosis status post laminectomy: Patient is currently prescribed Robaxin however states that he has not taken any medications for the past 2 weeks.  Ordered Tylenol as needed for now.  Dispo: Admit patient to Inpatient with expected length of stay greater than 2 midnights.  Signed: Asencion Noble, MD 07/27/2020, 11:24 AM  Pager: [phone] After 5pm on weekdays and 1pm on weekends: On Call pager: [phone]

## 2020-07-27 NOTE — ED Notes (Signed)
Date and time results received: 07/27/20 0629 (use smartphrase ".now" to insert current time)  Test: trop Critical Value: 129  Name of Provider Notified: Dr. Leonette Monarch  Orders Received? Or Actions Taken?: aware

## 2020-07-27 NOTE — Hospital Course (Signed)
Admitted 07/27/2020  Allergies: Patient has no known allergies. Pertinent Hx: Combined HF, HTN, DM, NICM, CKD stage 3, lumbar spinal stenosis  64 y.o. male p/w LE swelling and SOB  * Acute on chronic HF exacerbation: 2 day hx of SOB, LE swelling and orthopnea. Ran out of meds x2 weeks. Reports atypical chest pain, trops 129>118. CXR clear. BNP 1900. Appears volume up on exam, HDS. Given 40 IV lasix with good output. Resuming home meds.  *AoCCKD: Cr 1.6, last check was around 1.2. Diuresis as above. Repeating in AM.   *Vtach: 4 beat run of Vtach, K normal. Monitoring for now.   Consults: None  Meds: Lasix, entresto, bidil VTE ppx: Lovenox IVF: None Diet: HH

## 2020-07-27 NOTE — ED Triage Notes (Signed)
The pt has not had the covic vaccine

## 2020-07-27 NOTE — ED Notes (Signed)
ECHO at bedside.

## 2020-07-27 NOTE — Progress Notes (Signed)
ReDS Clip Diuretic Study Pt study # [id number]  Your patient is in the Blinded arm of the ReDS Clip Diuretic study.  Your patient has had a ReDS reading and the reading has been transmitted to the cloud.   Thank You   The research team   Kerby Nora, PharmD, BCPS Heart Failure Stewardship Pharmacist Phone [phone]  Please check [email or web address] for unit-specific pharmacist phone numbers

## 2020-07-27 NOTE — ED Notes (Signed)
Pt with 4 beat runs of vtach noted. Rhythm strip printed out and given to edp. Pt placed on 12L monitor. Pt does endorse cocaine use Sunday. Pt now states intermittent chest pain. ED PA aware.

## 2020-07-27 NOTE — Progress Notes (Signed)
  Echocardiogram 2D Echocardiogram has been performed.  Anilah Huck G Alisa Stjames 07/27/2020, 4:09 PM

## 2020-07-28 NOTE — Discharge Summary (Signed)
Name: CLAUDELL WOHLER MRN: [id number] DOB: Oct 11, 1955 64 y.o. PCP: Harvie Heck, MD  Date of Admission: 07/27/2020  5:07 AM Date of Discharge: 07/27/2020 Attending Physician: Dorian Pod A  Discharge Diagnosis: 1. Acute HF exacerbation 2. Nonsustained V tach 3. Acute on chronic kidney disease 4. Polysubstance abuse 5. Hx of lumbar spinal stenosis s/p laminectomy  Discharge Medications: Allergies as of 07/27/2020   No Known Allergies     Medication List    STOP taking these medications   gabapentin 300 MG capsule Commonly known as: NEURONTIN   methocarbamol 750 MG tablet Commonly known as: Robaxin-750   oxyCODONE-acetaminophen 7.5-325 MG tablet Commonly known as: Percocet     TAKE these medications   BiDil 20-37.5 MG tablet Generic drug: isosorbide-hydrALAZINE TAKE 2 TABLETS BY MOUTH 3 (THREE) TIMES A DAY What changed: See the new instructions.   Blood Pressure Monitor Devi Please use to monitor blood pressure as directed.  ICD: I10 (hypertension)   Entresto 49-51 MG Generic drug: sacubitril-valsartan Take 1 tablet by mouth 2 (two) times daily.   eplerenone 25 MG tablet Commonly known as: INSPRA Take 1 tablet (25 mg total) by mouth daily.   furosemide 20 MG tablet Commonly known as: LASIX Take 1 tablet (20 mg total) by mouth daily.   rosuvastatin 5 MG tablet Commonly known as: CRESTOR Take 1 tablet (5 mg total) by mouth daily.       Disposition and follow-up:   Mr.Maciah L Bisceglia was discharged from [facility] in [place] condition.  At the hospital follow up visit please address:  1.  HF exacerbation: Weight on discharge was 90.9 kg. Increased lasix to 40 mg daily and refilled home HF medications. Please assess volume status, medication compliance, and cocaine cession. Repeat labs.  Acute on chronic kidney disease: Cr up to 1.6, BL around 1.2. Please repeat labs.   2.  Labs / imaging needed at time of follow-up: BMP,   3.   Pending labs/ test needing follow-up: None  Follow-up Appointments:  Follow-up Information    Harvie Heck, MD Follow up on 08/01/2020.   Specialty: Internal Medicine Why: at 8:45am with Dr. Collene Gobble. Contact information: [address] [place] [id number] [place] Hospital Course by problem list: 1. Acute HF exacerbation: This is a 64 year old male with a history of hypertension, systolic and diastolic heart failure(most recent EF, nonischemic cardiomyopathy, CKD stage III, lumbar spinal stenosis, and polysubstance abuse presenting with a 2-day history of lower extremity swelling, worsening shortness of breath, orthopnea and a productive cough with clear sputum.  Endorses intermittent atypical chest pain.  Reported running out of his medications approximately 2 weeks prior to admission.  Also endorsed cocaine use approximately 4 days prior to admission.  Noted to have elevated BNP of 1900.  Troponins 129 > 118.  Mild AKI with creatinine of 1.6.  EKG showed normal sinus rhythm, chronic left bundle branch block, unchanged from prior.  Patient was given IV Lasix and had good urinary output reported symptomatic improvement in his shortness of breath.  Repeated echocardiogram that showed newly decreased EF 20-25%, global hypokinesis, severely dilated LV internal cavity, and grade 3 diastolic dysfunction.  As well as severely dilated left atrium.  On reevaluation later that evening patient reported that he did not want to stay.  Discussed that the recommended further evaluation and treatment in the hospital however he stated he would  not want to stay.  Patient was resumed on his home heart failure medications with increase of Lasix to 40 mg daily.  Has close follow-up in clinic early next week.  2. Nonsustained V tach: Asymptomatic, noted on telemetry in the ER, initially was around 4 beats and had increased to 15 beats.  His mag and potassium were unremarkable.   Recommended to monitor on telemetry overnight in the hospital however patient stated he did not want to stay.   3. Acute on chronic kidney disease: Creatinine elevated to 1.6 up from baseline of around 1.2.  Patient was resumed on his home heart failure medication.  Patient will need repeat BMP on follow-up.  4. Polysubstance abuse: Endorsed occasional cocaine use, last use was on Sunday.  Patient has a history of nonischemic cardiomyopathy and systolic heart failure secondary to cocaine use.  Discussed the importance of cessation and relation to his current state.   5. Hx of lumbar spinal stenosis s/p laminectomy: Patient reported that he was not taking any medications for the past 2 weeks.  We had held his Robaxin and gabapentin on admission and held it on discharge.  Will need to see if this needs to be resumed.  Discharge Vitals:   BP (!) 130/101   Pulse 83   Temp 98 F (36.7 C) (Oral)   Resp (!) 24   Ht 6' (1.829 m)   Wt 90.9 kg   SpO2 99%   BMI 27.18 kg/m   Pertinent Labs, Studies, and Procedures:  CBC Latest Ref Rng & Units 07/27/2020 06/10/2019 06/09/2019  WBC 4.0 - 10.5 K/uL 9.7 11.7(H) 14.7(H)  Hemoglobin 13.0 - 17.0 g/dL 14.0 10.8(L) 10.5(L)  Hematocrit 39.0 - 52.0 % 43.1 32.8(L) 31.5(L)  Platelets 150 - 400 K/uL 216 125(L) 129(L)   BMP Latest Ref Rng & Units 07/27/2020 06/10/2019 06/09/2019  Glucose 70 - 99 mg/dL 94 101(H) 114(H)  BUN 8 - 23 mg/dL 28(H) 14 15  Creatinine 0.61 - 1.24 mg/dL 1.60(H) 1.24 1.23  BUN/Creat Ratio 10 - 24 - - -  Sodium 135 - 145 mmol/L 140 136 139  Potassium 3.5 - 5.1 mmol/L 4.2 3.9 4.1  Chloride 98 - 111 mmol/L 110 101 107  CO2 22 - 32 mmol/L 18(L) 27 27  Calcium 8.9 - 10.3 mg/dL 8.6(L) 9.0 8.5(L)   CXR 12/16: IMPRESSION: Hyperexpansion with chronic interstitial coarsening. No acute cardiopulmonary findings.  12/16 Echocardiogram: IMPRESSIONS   1. Left ventricular ejection fraction, by estimation, is 20 to 25%. The  left ventricle  has severely decreased function. The left ventricle  demonstrates global hypokinesis. The left ventricular internal cavity size  was severely dilated. Left ventricular  diastolic parameters are consistent with Grade III diastolic dysfunction  (restrictive).  2. Right ventricular systolic function is normal. The right ventricular  size is mildly enlarged. There is mildly elevated pulmonary artery  systolic pressure.  3. Left atrial size was severely dilated.  4. Right atrial size was mildly dilated.  5. The mitral valve is normal in structure. Moderate mitral valve  regurgitation. No evidence of mitral stenosis.  6. The aortic valve is tricuspid. Aortic valve regurgitation is moderate.  Mild aortic valve sclerosis is present, with no evidence of aortic valve  stenosis.  7. Aortic dilatation noted. There is mild dilatation of the aortic root,  measuring 40 mm. There is mild dilatation of the ascending aorta,  measuring 43 mm.  8. The inferior vena cava is normal in size with greater than 50%  respiratory variability, suggesting right atrial pressure of 3 mmHg.   Discharge Instructions: Discharge Instructions    (HEART FAILURE PATIENTS) Call MD:  Anytime you have any of the following symptoms: 1) 3 pound weight gain in 24 hours or 5 pounds in 1 week 2) shortness of breath, with or without a dry hacking cough 3) swelling in the hands, feet or stomach 4) if you have to sleep on extra pillows at night in order to breathe.   Complete by: As directed    Call MD for:  difficulty breathing, headache or visual disturbances   Complete by: As directed    Call MD for:  extreme fatigue   Complete by: As directed    Call MD for:  hives   Complete by: As directed    Call MD for:  persistant dizziness or light-headedness   Complete by: As directed    Call MD for:  persistant nausea and vomiting   Complete by: As directed    Call MD for:  redness, tenderness, or signs of infection (pain,  swelling, redness, odor or green/yellow discharge around incision site)   Complete by: As directed    Call MD for:  severe uncontrolled pain   Complete by: As directed    Call MD for:  temperature >100.4   Complete by: As directed    Diet - low sodium heart healthy   Complete by: As directed    Diet - low sodium heart healthy   Complete by: As directed    Discharge instructions   Complete by: As directed    Mr. Moroni, Nester were admitted to the hospital due to a heart failure exacerbation, this may be due to you running out of your medications or due to your cocaine use. We repeated some imaging of your heart.  We have refilled your heart failure medications. Please restart taking your medications. We will need you to follow up in the clinic early next week to repeat some labs and assess how you are feeling.   Discharge instructions   Complete by: As directed    Current Medications:    furosemide (LASIX) tablet 40 mg, take 2 tablets daily until your follow up appointment on Tuesday.   isosorbide-hydrALAZINE (BIDIL) 20-37.5 MG per tablet 2 tablet, 2 tablet, Oral, three time daily    rosuvastatin (CRESTOR) tablet 5 mg, 5 mg, Oral, Daily   sacubitril-valsartan (ENTRESTO) 49-51 mg per tablet, 1 tablet twice daily.    eplerenone (INSPRA) 25 MG tablet, Take 1 tablet (25 mg total) by mouth daily.  Please make sure to follow up on Tuesday.   Heart Failure patients record your daily weight using the same scale at the same time of day   Complete by: As directed    Increase activity slowly   Complete by: As directed    Increase activity slowly   Complete by: As directed    STOP any activity that causes chest pain, shortness of breath, dizziness, sweating, or exessive weakness   Complete by: As directed       Signed: Asencion Noble, MD 07/28/2020, 12:20 PM

## 2020-08-01 ENCOUNTER — Ambulatory Visit (INDEPENDENT_AMBULATORY_CARE_PROVIDER_SITE_OTHER): Payer: Medicare Other | Admitting: Internal Medicine

## 2020-08-01 ENCOUNTER — Telehealth: Payer: Self-pay | Admitting: *Deleted

## 2020-08-01 ENCOUNTER — Encounter: Payer: Self-pay | Admitting: Internal Medicine

## 2020-08-01 ENCOUNTER — Encounter (HOSPITAL_COMMUNITY): Payer: Self-pay | Admitting: Emergency Medicine

## 2020-08-01 ENCOUNTER — Other Ambulatory Visit: Payer: Self-pay

## 2020-08-01 VITALS — BP 78/44 | HR 98 | Temp 98.1°F | Ht 72.0 in | Wt 200.5 lb

## 2020-08-01 DIAGNOSIS — N1831 Chronic kidney disease, stage 3a: Secondary | ICD-10-CM | POA: Diagnosis not present

## 2020-08-01 DIAGNOSIS — I509 Heart failure, unspecified: Secondary | ICD-10-CM | POA: Diagnosis not present

## 2020-08-01 DIAGNOSIS — I5022 Chronic systolic (congestive) heart failure: Secondary | ICD-10-CM

## 2020-08-01 DIAGNOSIS — F1290 Cannabis use, unspecified, uncomplicated: Secondary | ICD-10-CM | POA: Insufficient documentation

## 2020-08-01 DIAGNOSIS — Z5321 Procedure and treatment not carried out due to patient leaving prior to being seen by health care provider: Secondary | ICD-10-CM | POA: Insufficient documentation

## 2020-08-01 DIAGNOSIS — R351 Nocturia: Secondary | ICD-10-CM | POA: Insufficient documentation

## 2020-08-01 DIAGNOSIS — N5089 Other specified disorders of the male genital organs: Secondary | ICD-10-CM

## 2020-08-01 DIAGNOSIS — N183 Chronic kidney disease, stage 3 unspecified: Secondary | ICD-10-CM | POA: Diagnosis not present

## 2020-08-01 DIAGNOSIS — R39198 Other difficulties with micturition: Secondary | ICD-10-CM | POA: Insufficient documentation

## 2020-08-01 DIAGNOSIS — R339 Retention of urine, unspecified: Secondary | ICD-10-CM

## 2020-08-01 LAB — URINALYSIS, ROUTINE W REFLEX MICROSCOPIC
Bilirubin Urine: NEGATIVE
Glucose, UA: NEGATIVE mg/dL
Hgb urine dipstick: NEGATIVE
Ketones, ur: NEGATIVE mg/dL
Leukocytes,Ua: NEGATIVE
Nitrite: NEGATIVE
Protein, ur: NEGATIVE mg/dL
Specific Gravity, Urine: 1.013 (ref 1.005–1.030)
pH: 5 (ref 5.0–8.0)

## 2020-08-01 LAB — BRAIN NATRIURETIC PEPTIDE: B Natriuretic Peptide: 447.6 pg/mL — ABNORMAL HIGH (ref 0.0–100.0)

## 2020-08-01 NOTE — ED Triage Notes (Signed)
Pt reports difficulty urinating x 1 week. He went to urgent care earlier today and had 579mL of output around 10a. Reports that pain is relieved with rest. He states that it is painful to urinate and he is only "trickling." Hx CHF and Stage 3 CKD. Endorses cocaine and marijuana use. Ambulatory.

## 2020-08-01 NOTE — Progress Notes (Signed)
   CC: Follow-up heart failure with reduced ejection fraction  HPI:  Edward Mullen is a 64 y.o. with medical history significant for hypertension, nonischemic cardiomyopathy, chronic systolic heart failure, cocaine use disorder, CKD stage III presenting after recent hospital visit.  Please see problem based charting for further details.    Past Medical History:  Diagnosis Date  . Arthritis    "feels like it in my legs" (09/20/2014)  . Borderline type 2 diabetes mellitus    patient denies  . CHF (congestive heart failure) ([place])   . Dysrhythmia   . Hypertension   . NSVT (nonsustained ventricular tachycardia) ([place]) 09/06/2016   Review of Systems:  AS per HPI  Physical Exam:  Vitals:   08/01/20 0855  BP: (!) 78/44  Pulse: 98  Temp: 98.1 F (36.7 C)  TempSrc: Oral  SpO2: 97%  Weight: 200 lb 8 oz (90.9 kg)  Height: 6' (1.829 m)   Physical Exam Vitals and nursing note reviewed.  Constitutional:      Appearance: Normal appearance.  Cardiovascular:     Rate and Rhythm: Normal rate and regular rhythm.     Pulses: Normal pulses.     Heart sounds: Normal heart sounds. No murmur heard.   Pulmonary:     Effort: Pulmonary effort is normal. No respiratory distress.     Breath sounds: Normal breath sounds.  Abdominal:     General: Bowel sounds are normal.     Palpations: Abdomen is soft.     Tenderness: There is no abdominal tenderness.  Skin:    General: Skin is warm.  Neurological:     Mental Status: He is alert.   Point-of-care bladder ultrasound:     Assessment & Plan:   See Encounters Tab for problem based charting.  Patient discussed with Dr. Rebeca Alert

## 2020-08-01 NOTE — Assessment & Plan Note (Signed)
Heart failure with reduced ejection fraction: Edward Mullen is a 64 year old gentleman with medical history significant for heart failure with reduced ejection fraction, nonischemic cardiomyopathy, CKD stage III presenting after recent hospitalization for management of heart failure exacerbation.  It appears that in January 2018, he was initially diagnosed with heart failure with reduced ejection fraction where he was found to have a left ventricular ejection fraction of 15-20% with diffuse hypokinesis.  Left and right heart cath was performed which showed nonischemic cardiomyopathy and thus no intervention was performed.  He had a repeat echocardiogram in August 2018 which showed an improved EF of 50%.  Another echocardiogram was performed in October 2020 which showed an ejection fraction of 55-60%.  However he recently presented to the hospital on July 27, 2020 with lower extremity edema where he was found to have a BNP elevated greater than 1900 found to be in acute heart failure exacerbation. His discharge weight from the clinic was 90.9 kg.  It appears that he did not want to stay in the hospital and was given refills of his medications and discharged.  He was also instructed to abstain from cocaine use.  His Lasix dose was also increased to 40 mg daily.  He was noted to be hypotensive here in the clinic.   BP initially was 78/44 with pulse of 98  Orthostatic vitals Supine: BP 82/55, pulse 46 Sitting: BP 97/67, pulse 76 Standing: BP 86/57  Plan: -Hold eplerenone, BiDil, Entresto -Cut down Lasix to 20 mg daily -Return to clinic in 1 to 2 days for BP follow-up -We will call patient after labs results and adjust medications as needed

## 2020-08-01 NOTE — Telephone Encounter (Signed)
Pt calls and states [facility] would not see him. After speaking to front office at urology- on hold for 29mins- nurse was told he had the wrong card. Pt has been ask to go to [facility] ED now and present paperwork given by [facility].also ask attending to call dr Louis Meckel who is on call for urology.

## 2020-08-01 NOTE — Telephone Encounter (Signed)
OFFICE CONTACTED FOR URGENT UROLOGY REFERRAL/ PATIENT SCHEDULED FOR TODAY AT 3:00PM (ARR[facility] 2:30PM). PATIENT WITH APPOINTMENT IN HAND.  Edward Mullen

## 2020-08-01 NOTE — Assessment & Plan Note (Signed)
Nocturia: He states that since Thursday he has been experiencing nocturia, dribbling, straining with urination and a sensation of a full bladder where he is not able to empty of his urine.  He does not endorse dysuria.He does not report of any family history of BPH, prostate cancer or personal history of BPH.    Postvoid residual showed urine volume of 429 mL.  Prostate volume was 101.25 mL.  Initial bladder scan resulted 468 cc.   Plan: -An urgent referral has been placed to urology -Unable to place Foley catheter due to extreme discomfort for patient

## 2020-08-01 NOTE — Patient Instructions (Addendum)
[name],  It was a pleasure taking care of you in the clinic today.  Here my recommendations after our visit.  1.  We will refer you you to go see a specialized prostate doctor after placing the Foley catheter 2.  For your medications, please hold the eplerenone, BiDil and Entresto for today.  Cut back the Lasix to 20 mg a day.  We will have you come to the clinic in 2 days to recheck your blood pressure.  Take Care! Dr. Eileen Stanford  Please call the internal medicine center clinic if you have any questions or concerns, we may be able to help and keep you from a long and expensive emergency room wait. Our clinic and after hours phone number is [phone], the best time to call is Monday through Friday 9 am to 4 pm but there is always someone available 24/7 if you have an emergency. If you need medication refills please notify your pharmacy one week in advance and they will send [place] a request.   If you have not gotten the COVID vaccine, I recommend doing so:  You may get it at your local [facility] or [facility] OR To schedule an appointment for a COVID vaccine or be added to the vaccine wait list: Go to [email or web address]   OR Go to [email or web address]                  OR Call [phone]                                     OR Call [phone] and select Option 2

## 2020-08-02 ENCOUNTER — Emergency Department (HOSPITAL_COMMUNITY)
Admission: EM | Admit: 2020-08-02 | Discharge: 2020-08-02 | Disposition: A | Discharge status: Home / Self Care | Payer: Medicare Other | Attending: Emergency Medicine | Admitting: Emergency Medicine

## 2020-08-02 ENCOUNTER — Telehealth: Payer: Self-pay

## 2020-08-02 LAB — BMP8+ANION GAP
Anion Gap: 16 mmol/L (ref 10.0–18.0)
BUN/Creatinine Ratio: 13 (ref 10–24)
BUN: 18 mg/dL (ref 8–27)
CO2: 22 mmol/L (ref 20–29)
Calcium: 8.7 mg/dL (ref 8.6–10.2)
Chloride: 104 mmol/L (ref 96–106)
Creatinine, Ser: 1.42 mg/dL — ABNORMAL HIGH (ref 0.76–1.27)
GFR calc Af Amer: 59 mL/min/{1.73_m2} — ABNORMAL LOW (ref >59)
GFR calc non Af Amer: 51 mL/min/{1.73_m2} — ABNORMAL LOW (ref >59)
Glucose: 98 mg/dL (ref 65–99)
Potassium: 4 mmol/L (ref 3.5–5.2)
Sodium: 142 mmol/L (ref 134–144)

## 2020-08-02 NOTE — Telephone Encounter (Signed)
Pt called stated he went to his appt yesterday @ 330 he was called back at 430 and he was not seen by 1030 pm when he decided to leave ... pt needs a call back  Because he states its getting hard for him to void his urine

## 2020-08-02 NOTE — Telephone Encounter (Signed)
I called the patient and he states that he is still experiencing dribbling.  I wanted to inquire about the insurance card he has however he states he did not have his wallet with him.  I will try to reach out to urology as well.  As reiterated, he went to [facility] urology yesterday and his insurance was not accepted.  He was advised to go to the emergency department however left due to longer wait times.  I advised him to present to the emergency department as that will be the ultimate solution to his urinary retention.  He is scheduled to see me tomorrow as well.

## 2020-08-03 ENCOUNTER — Encounter: Payer: Self-pay | Admitting: Internal Medicine

## 2020-08-03 ENCOUNTER — Ambulatory Visit (INDEPENDENT_AMBULATORY_CARE_PROVIDER_SITE_OTHER): Payer: Medicare Other | Admitting: Internal Medicine

## 2020-08-03 DIAGNOSIS — I5022 Chronic systolic (congestive) heart failure: Secondary | ICD-10-CM

## 2020-08-03 DIAGNOSIS — R351 Nocturia: Secondary | ICD-10-CM

## 2020-08-03 DIAGNOSIS — I10 Essential (primary) hypertension: Secondary | ICD-10-CM | POA: Diagnosis not present

## 2020-08-03 MED ORDER — BIDIL 20-37.5 MG PO TABS: 1.0000 | ORAL_TABLET | Freq: Two times a day (BID) | ORAL | 3 refills | Status: DC

## 2020-08-03 MED ORDER — BLOOD PRESSURE MONITOR DEVI
0 refills | Status: DC
Start: 2020-08-03 — End: 2022-01-29

## 2020-08-03 NOTE — Assessment & Plan Note (Signed)
Urinary retention: He is advised to call his insurance company to change the provider name on his insurance card.  Also advised to present to the emergency department as he has ongoing urinary retention can cause renal failure.  He expressed understanding

## 2020-08-03 NOTE — Assessment & Plan Note (Signed)
Heart failure with reduced ejection fraction: I evaluated Edward Mullen on August 01, 2020 where he was found to have low blood pressure however he was completely asymptomatic.  During that visit, he was asked to hold his eplerenone, BiDil, Entresto and cut back his Lasix from 40 mg a day to 20 mg a day.  Today, his blood pressure is unremarkable and continues to remain completely asymptomatic without signs of volume overload.  His blood pressure today is 123/91  Plan: -Resume eplerenone 25 mg daily -Resume BiDil 20-37.5 mg TWICE DAILY instead of 3 times daily -Resume Entresto 49-51 mg twice daily -Continue Lasix 20 mg daily (increase to 40 mg daily when weight is 3 pounds over dry weight in 1 day and 5 pounds over dry weight in 2 days) -He is to purchase a blood pressure monitoring kit  **Advised to hold medication if SBP <100 and give [place] or cardiology a call

## 2020-08-03 NOTE — Patient Instructions (Addendum)
Mr. Kiener.   It was a pleasure taking care of you in the clinic today.  Here my recommendations after our visit.  Here are the medications I want you to take.  1.  Eplerenone 25 mg daily 2.  BiDil 20-37.5 mg twice a day instead of 3 times a day 3.  Entresto 1 tablet twice a day 4.  Lasix 20 mg a day -If your weight goes up 3 pounds per day, take Lasix 40 mg a day -If your weight goes up 5 pounds in 2 days, take Lasix 40 mg a day   Please call your insurance to change the physician name on your insurance card and please report to the emergency department for the urinary retention.

## 2020-08-03 NOTE — Progress Notes (Signed)
   CC: Follow-up heart failure with reduced ejection fraction HPI:  [name] is a 64 y.o. with medical history significant for heart failure with reduced ejection fraction presenting for follow-up.  Please see problem based charting for further details.  Past Medical History:  Diagnosis Date  . Arthritis    "feels like it in my legs" (09/20/2014)  . Borderline type 2 diabetes mellitus    patient denies  . CHF (congestive heart failure) ([place])   . Dysrhythmia   . Hypertension   . NSVT (nonsustained ventricular tachycardia) ([place]) 09/06/2016   Review of Systems:  As per HPI  Physical Exam:  Vitals:   08/03/20 1121  BP: (!) 123/91  Pulse: (!) 54  Temp: (!) 97.4 F (36.3 C)  TempSrc: Oral  SpO2: 98%  Weight: 209 lb 9.6 oz (95.1 kg)  Height: 6' (1.829 m)   Physical Exam Vitals and nursing note reviewed.  Cardiovascular:     Rate and Rhythm: Normal rate.     Heart sounds: Normal heart sounds.  Pulmonary:     Effort: No respiratory distress.     Breath sounds: Normal breath sounds.  Musculoskeletal:        General: No swelling.     Cervical back: Neck supple.     Right lower leg: No edema.     Left lower leg: No edema.  Skin:    General: Skin is warm.     Assessment & Plan:   See Encounters Tab for problem based charting.  Patient discussed with Dr. Daryll Drown

## 2020-08-03 NOTE — Addendum Note (Signed)
Addended by: Truddie Crumble on: 08/03/2020 09:02 AM   Modules accepted: Orders

## 2020-08-08 NOTE — Progress Notes (Signed)
Internal Medicine Clinic Attending  Case discussed with Dr. Agyei  At the time of the visit.  We reviewed the resident's history and exam and pertinent patient test results.  I agree with the assessment, diagnosis, and plan of care documented in the resident's note.  

## 2020-08-16 NOTE — Progress Notes (Signed)
Internal Medicine Clinic Attending  I saw and evaluated the patient.  I personally confirmed the key portions of the history and exam documented by Dr. Dortha Schwalbe and I reviewed pertinent patient test results.  We performed the bladder ultrasound together which confirmed urinary retention and showed large mass having mass effect on posterior bladder wall, likely enlarged prostate. Referred urgently to urology. The assessment, diagnosis, and plan were formulated together and I agree with the documentation in the resident's note.  Jessy Oto, M.D., Ph.D.

## 2020-11-22 IMAGING — US US SCROTUM W/ DOPPLER COMPLETE
1 series · 13 of 25 positions shown · non-contrast
Comparison: None

CLINICAL DATA: Scrotal/testicular mass, laterality not specified

EXAM:
SCROTAL ULTRASOUND
DOPPLER ULTRASOUND OF THE TESTICLES
TECHNIQUE: Complete ultrasound examination of the testicles, epididymis, and
other scrotal structures was performed. Color and spectral Doppler
ultrasound were also utilized to evaluate blood flow to the
testicles.

[Series 1: us scrotum w/ doppler complete · 13 of 74 slices shown]
[im 1/74]
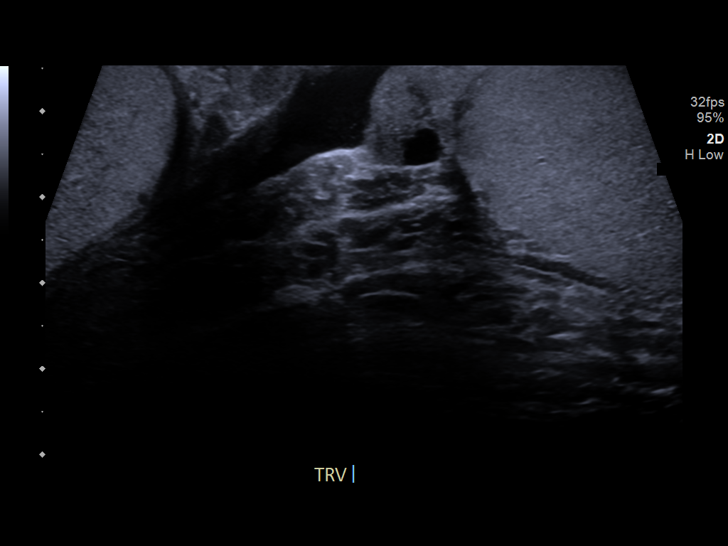
[im 7/74]
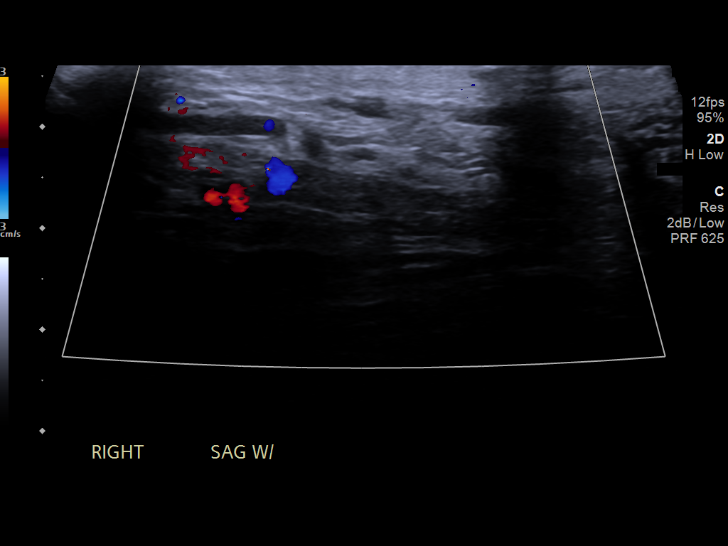
[im 13/74]
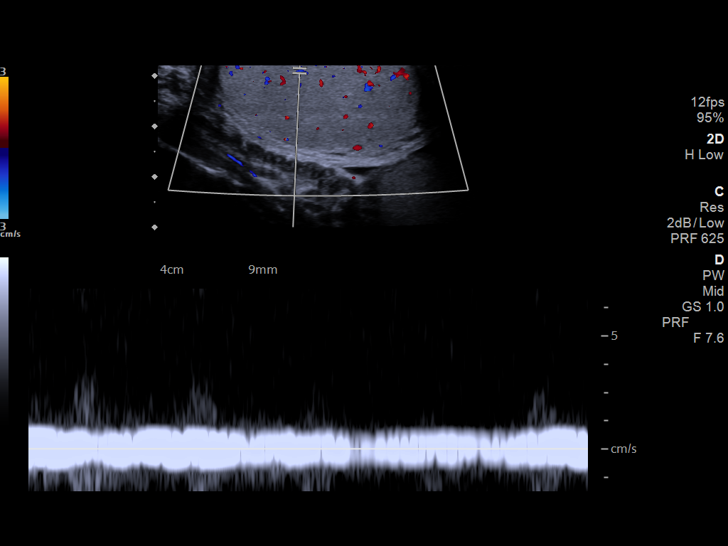
[im 19/74]
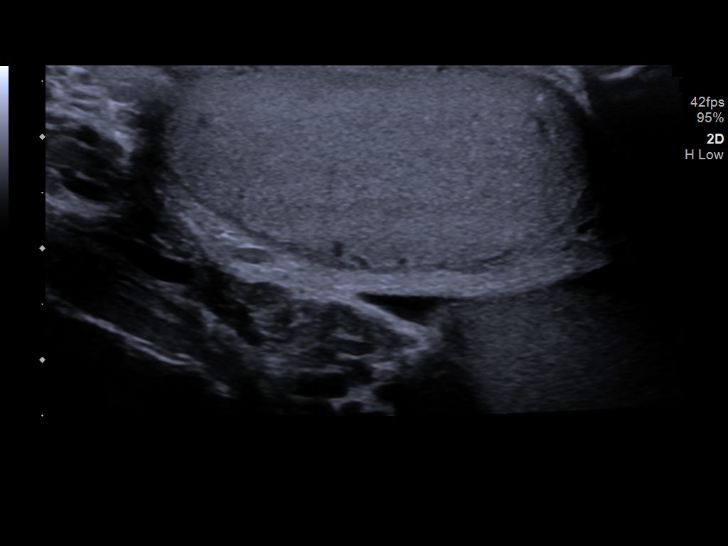
[im 25/74]
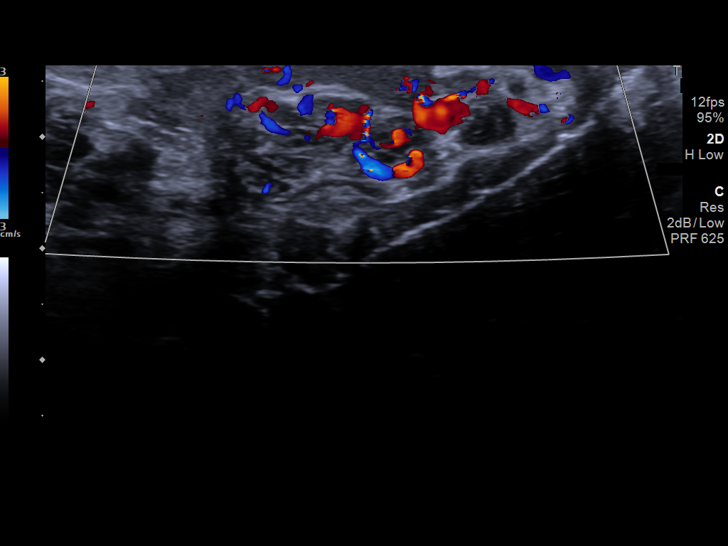
[im 31/74]
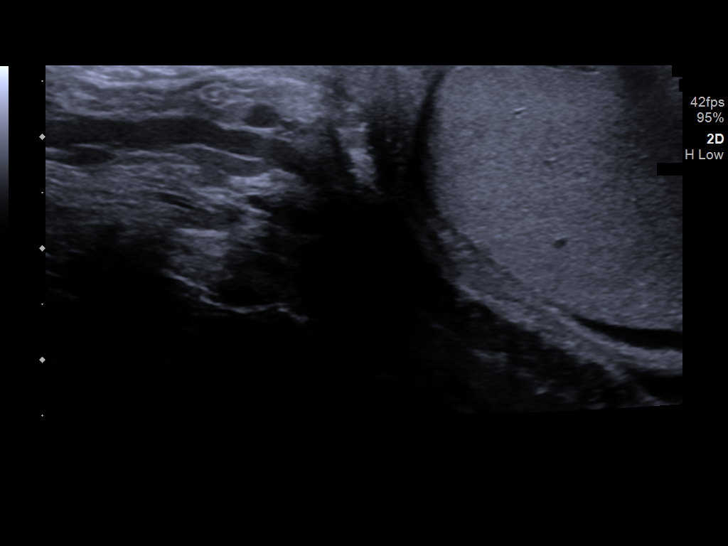
[im 37/74]
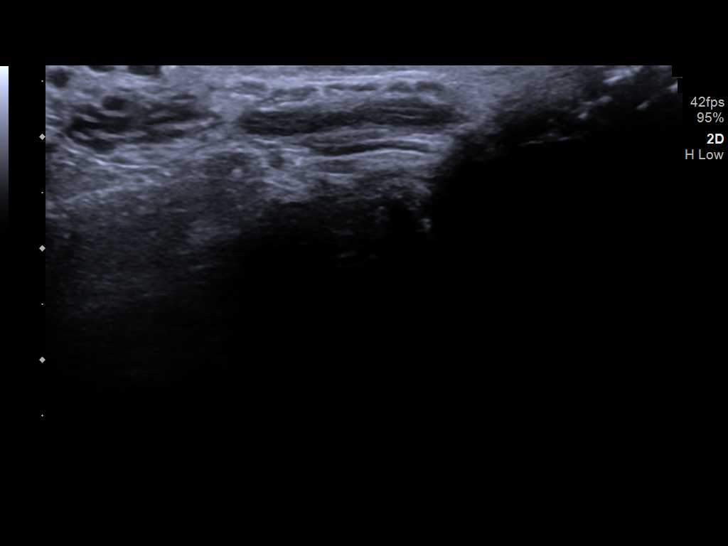
[im 43/74]
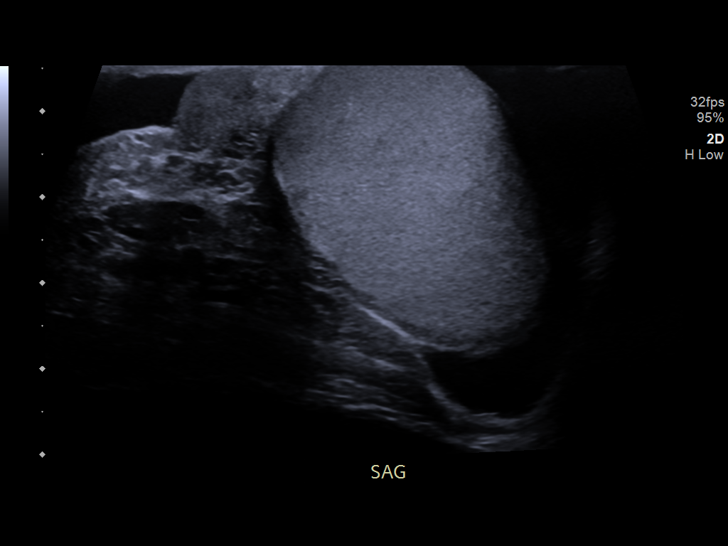
[im 49/74]
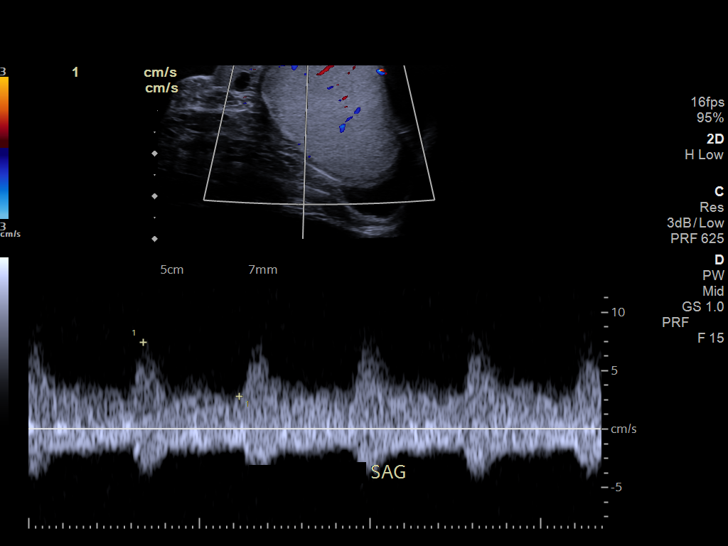
[im 55/74]
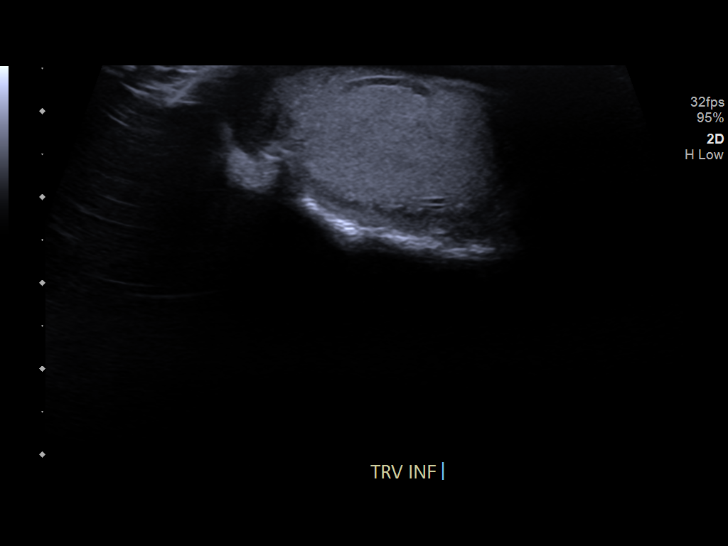
[im 61/74]
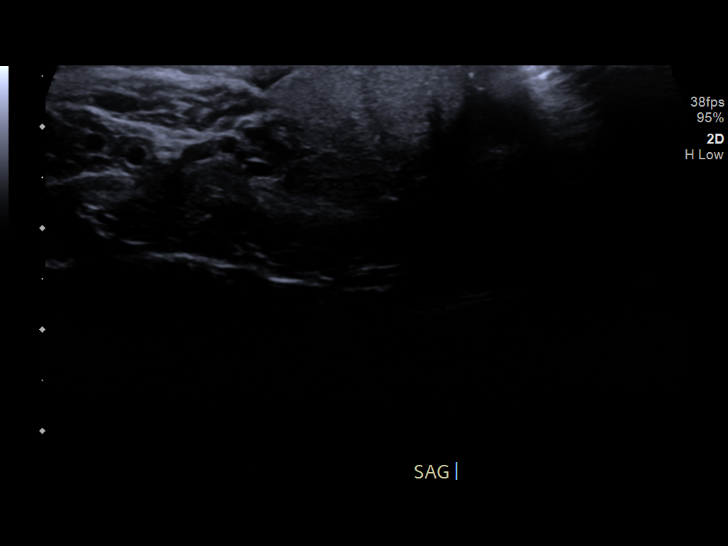
[im 67/74]
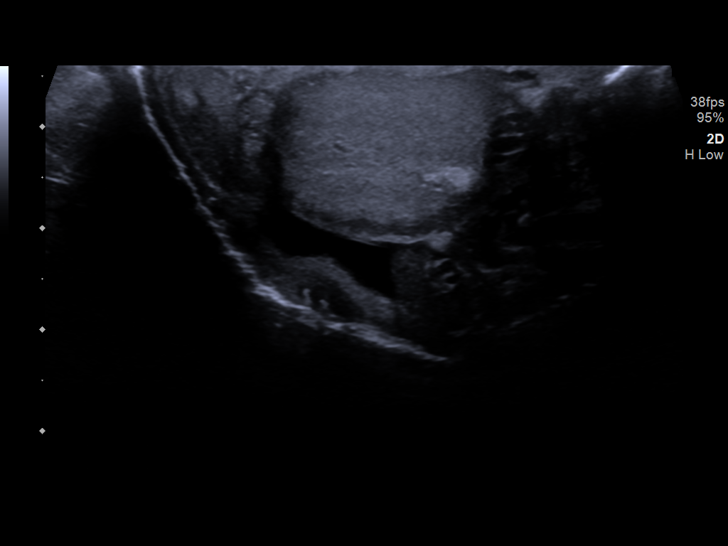
[im 74/74]
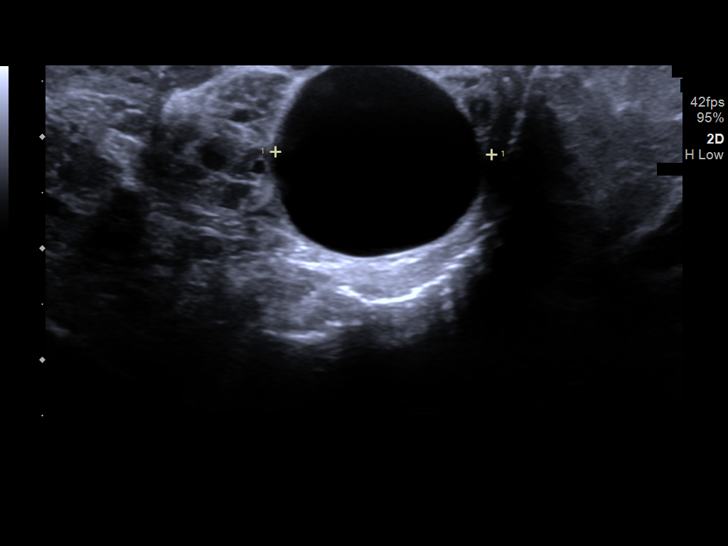

[13 of 25 positions shown; findings below may reference images not displayed]

FINDINGS: Right testicle

Measurements: 4.0 x 2.2 x 2.9 cm. Normal echogenicity without mass
or calcification. Internal blood flow present on color Doppler
imaging.

Left testicle

Measurements: 4.0 x 2.8 x 3.4 cm. Normal echogenicity without mass
or calcification. Internal blood flow present on color Doppler
imaging symmetric with RIGHT

Right epididymis:  Normal in size and appearance.

Left epididymis:  Tiny cyst at LEFT epididymal head 5 x 4 x 5 mm.

Hydrocele:  Small LEFT hydrocele.  No RIGHT hydrocele.

Varicocele:  None visualized.

Pulsed Doppler interrogation of both testes demonstrates normal low
resistance arterial and venous waveforms bilaterally.

At the LEFT inguinal region, a cystic collection is identified 2.4 x
1.7 x 1.9 cm, simple features, could represent spermatic cord
hydrocele or loculated ascites within the inguinal canal.
IMPRESSION: Normal appearing testes.

Small LEFT hydrocele with tiny cyst at LEFT epididymal head 5 mm
diameter.

2.4 cm diameter simple cyst at the LEFT inguinal region question
spermatic cord hydrocele versus loculated ascites, other simple
cysts such as lymphocele and seroma not excluded.

## 2020-11-28 ENCOUNTER — Emergency Department (HOSPITAL_COMMUNITY): Payer: Medicare (Managed Care)

## 2020-11-28 ENCOUNTER — Inpatient Hospital Stay (HOSPITAL_COMMUNITY)
Admission: EM | Admit: 2020-11-28 | Discharge: 2020-12-05 | DRG: 286 | Disposition: A | Discharge status: Home / Self Care | Payer: Medicare (Managed Care) | Attending: Internal Medicine | Admitting: Internal Medicine

## 2020-11-28 ENCOUNTER — Encounter (HOSPITAL_COMMUNITY): Payer: Self-pay | Admitting: Emergency Medicine

## 2020-11-28 DIAGNOSIS — N179 Acute kidney failure, unspecified: Secondary | ICD-10-CM | POA: Diagnosis present

## 2020-11-28 DIAGNOSIS — F17210 Nicotine dependence, cigarettes, uncomplicated: Secondary | ICD-10-CM | POA: Diagnosis present

## 2020-11-28 DIAGNOSIS — N5089 Other specified disorders of the male genital organs: Secondary | ICD-10-CM

## 2020-11-28 DIAGNOSIS — I472 Ventricular tachycardia: Secondary | ICD-10-CM | POA: Diagnosis present

## 2020-11-28 DIAGNOSIS — Z8249 Family history of ischemic heart disease and other diseases of the circulatory system: Secondary | ICD-10-CM | POA: Diagnosis not present

## 2020-11-28 DIAGNOSIS — I5043 Acute on chronic combined systolic (congestive) and diastolic (congestive) heart failure: Secondary | ICD-10-CM | POA: Diagnosis present

## 2020-11-28 DIAGNOSIS — N401 Enlarged prostate with lower urinary tract symptoms: Secondary | ICD-10-CM | POA: Diagnosis present

## 2020-11-28 DIAGNOSIS — Z91138 Patient's unintentional underdosing of medication regimen for other reason: Secondary | ICD-10-CM | POA: Diagnosis not present

## 2020-11-28 DIAGNOSIS — F1290 Cannabis use, unspecified, uncomplicated: Secondary | ICD-10-CM | POA: Diagnosis present

## 2020-11-28 DIAGNOSIS — T501X6A Underdosing of loop [high-ceiling] diuretics, initial encounter: Secondary | ICD-10-CM | POA: Diagnosis present

## 2020-11-28 DIAGNOSIS — I130 Hypertensive heart and chronic kidney disease with heart failure and stage 1 through stage 4 chronic kidney disease, or unspecified chronic kidney disease: Secondary | ICD-10-CM | POA: Diagnosis not present

## 2020-11-28 DIAGNOSIS — I2510 Atherosclerotic heart disease of native coronary artery without angina pectoris: Secondary | ICD-10-CM | POA: Diagnosis present

## 2020-11-28 DIAGNOSIS — Z823 Family history of stroke: Secondary | ICD-10-CM

## 2020-11-28 DIAGNOSIS — R7303 Prediabetes: Secondary | ICD-10-CM | POA: Diagnosis not present

## 2020-11-28 DIAGNOSIS — N433 Hydrocele, unspecified: Secondary | ICD-10-CM | POA: Diagnosis present

## 2020-11-28 DIAGNOSIS — I447 Left bundle-branch block, unspecified: Secondary | ICD-10-CM | POA: Diagnosis present

## 2020-11-28 DIAGNOSIS — Z20822 Contact with and (suspected) exposure to covid-19: Secondary | ICD-10-CM | POA: Diagnosis present

## 2020-11-28 DIAGNOSIS — F1990 Other psychoactive substance use, unspecified, uncomplicated: Secondary | ICD-10-CM | POA: Diagnosis not present

## 2020-11-28 DIAGNOSIS — N509 Disorder of male genital organs, unspecified: Secondary | ICD-10-CM | POA: Diagnosis not present

## 2020-11-28 DIAGNOSIS — I5021 Acute systolic (congestive) heart failure: Secondary | ICD-10-CM | POA: Diagnosis not present

## 2020-11-28 DIAGNOSIS — R339 Retention of urine, unspecified: Secondary | ICD-10-CM | POA: Diagnosis not present

## 2020-11-28 DIAGNOSIS — I10 Essential (primary) hypertension: Secondary | ICD-10-CM | POA: Diagnosis not present

## 2020-11-28 DIAGNOSIS — F1410 Cocaine abuse, uncomplicated: Secondary | ICD-10-CM | POA: Diagnosis present

## 2020-11-28 DIAGNOSIS — I420 Dilated cardiomyopathy: Secondary | ICD-10-CM | POA: Diagnosis present

## 2020-11-28 DIAGNOSIS — T465X6A Underdosing of other antihypertensive drugs, initial encounter: Secondary | ICD-10-CM | POA: Diagnosis not present

## 2020-11-28 DIAGNOSIS — I5023 Acute on chronic systolic (congestive) heart failure: Secondary | ICD-10-CM | POA: Diagnosis not present

## 2020-11-28 DIAGNOSIS — I509 Heart failure, unspecified: Secondary | ICD-10-CM

## 2020-11-28 DIAGNOSIS — J449 Chronic obstructive pulmonary disease, unspecified: Secondary | ICD-10-CM | POA: Diagnosis present

## 2020-11-28 DIAGNOSIS — R338 Other retention of urine: Secondary | ICD-10-CM | POA: Diagnosis present

## 2020-11-28 DIAGNOSIS — N1831 Chronic kidney disease, stage 3a: Secondary | ICD-10-CM | POA: Diagnosis not present

## 2020-11-28 LAB — CBC
HCT: 44.8 % (ref 39.0–52.0)
Hemoglobin: 13.9 g/dL (ref 13.0–17.0)
MCH: 28.1 pg (ref 26.0–34.0)
MCHC: 31 g/dL (ref 30.0–36.0)
MCV: 90.7 fL (ref 80.0–100.0)
Platelets: 182 10*3/uL (ref 150–400)
RBC: 4.94 MIL/uL (ref 4.22–5.81)
RDW: 16.1 % — ABNORMAL HIGH (ref 11.5–15.5)
WBC: 8 10*3/uL (ref 4.0–10.5)
nRBC: 0 % (ref 0.0–0.2)

## 2020-11-28 LAB — BASIC METABOLIC PANEL
Anion gap: 10 (ref 5–15)
BUN: 31 mg/dL — ABNORMAL HIGH (ref 8–23)
CO2: 21 mmol/L — ABNORMAL LOW (ref 22–32)
Calcium: 8.4 mg/dL — ABNORMAL LOW (ref 8.9–10.3)
Chloride: 110 mmol/L (ref 98–111)
Creatinine, Ser: 1.61 mg/dL — ABNORMAL HIGH (ref 0.61–1.24)
GFR, Estimated: 47 mL/min — ABNORMAL LOW (ref >60)
Glucose, Bld: 91 mg/dL (ref 70–99)
Potassium: 3.6 mmol/L (ref 3.5–5.1)
Sodium: 141 mmol/L (ref 135–145)

## 2020-11-28 LAB — TROPONIN I (HIGH SENSITIVITY)
Troponin I (High Sensitivity): 36 ng/L — ABNORMAL HIGH (ref <18)
Troponin I (High Sensitivity): 43 ng/L — ABNORMAL HIGH (ref <18)
Troponin I (High Sensitivity): 32 ng/L — ABNORMAL HIGH (ref <18)
Troponin I (High Sensitivity): 39 ng/L — ABNORMAL HIGH (ref <18)

## 2020-11-28 LAB — SARS CORONAVIRUS 2 (TAT 6-24 HRS): SARS Coronavirus 2: NEGATIVE

## 2020-11-28 LAB — MAGNESIUM: Magnesium: 1.9 mg/dL (ref 1.7–2.4)

## 2020-11-28 LAB — BRAIN NATRIURETIC PEPTIDE: B Natriuretic Peptide: 2565.3 pg/mL — ABNORMAL HIGH (ref 0.0–100.0)

## 2020-11-28 LAB — HIV ANTIBODY (ROUTINE TESTING W REFLEX): HIV Screen 4th Generation wRfx: NONREACTIVE

## 2020-11-28 MED ORDER — ROSUVASTATIN CALCIUM 5 MG PO TABS
5.0000 mg | ORAL_TABLET | Freq: Every day | ORAL | Status: DC
  Administered 2020-11-28 – 2020-12-05 (×8): 5 mg via ORAL
  Filled 2020-11-28 (×8): qty 1

## 2020-11-28 MED ORDER — SODIUM CHLORIDE 0.9% FLUSH
3.0000 mL | Freq: Two times a day (BID) | INTRAVENOUS | Status: DC
  Administered 2020-11-28 – 2020-12-04 (×10): 3 mL via INTRAVENOUS

## 2020-11-28 MED ORDER — FUROSEMIDE 10 MG/ML IJ SOLN
20.0000 mg | Freq: Once | INTRAMUSCULAR | Status: AC
  Administered 2020-11-28: 20 mg via INTRAVENOUS
  Filled 2020-11-28: qty 2

## 2020-11-28 MED ORDER — FUROSEMIDE 10 MG/ML IJ SOLN
40.0000 mg | Freq: Two times a day (BID) | INTRAMUSCULAR | Status: DC
  Administered 2020-11-29: 40 mg via INTRAVENOUS
  Filled 2020-11-28: qty 4

## 2020-11-28 MED ORDER — ONDANSETRON HCL 4 MG/2ML IJ SOLN: 4.0000 mg | Freq: Four times a day (QID) | INTRAMUSCULAR | Status: DC | PRN

## 2020-11-28 MED ORDER — SODIUM CHLORIDE 0.9 % IV SOLN
250.0000 mL | INTRAVENOUS | Status: DC | PRN
  Administered 2020-12-04: 75 mL/h via INTRAVENOUS

## 2020-11-28 MED ORDER — POTASSIUM CHLORIDE CRYS ER 20 MEQ PO TBCR: 40.0000 meq | EXTENDED_RELEASE_TABLET | Freq: Once | ORAL | Status: DC

## 2020-11-28 MED ORDER — ACETAMINOPHEN 325 MG PO TABS: 650.0000 mg | ORAL_TABLET | ORAL | Status: DC | PRN

## 2020-11-28 MED ORDER — SACUBITRIL-VALSARTAN 49-51 MG PO TABS
1.0000 | ORAL_TABLET | Freq: Two times a day (BID) | ORAL | Status: DC
  Administered 2020-11-28: 1 via ORAL
  Filled 2020-11-28 (×3): qty 1

## 2020-11-28 MED ORDER — ENOXAPARIN SODIUM 40 MG/0.4ML ~~LOC~~ SOLN
40.0000 mg | SUBCUTANEOUS | Status: DC
  Administered 2020-11-28 – 2020-12-03 (×6): 40 mg via SUBCUTANEOUS
  Filled 2020-11-28 (×6): qty 0.4

## 2020-11-28 MED ORDER — SODIUM CHLORIDE 0.9% FLUSH: 3.0000 mL | INTRAVENOUS | Status: DC | PRN

## 2020-11-28 MED ORDER — FUROSEMIDE 10 MG/ML IJ SOLN
60.0000 mg | Freq: Once | INTRAMUSCULAR | Status: AC
  Administered 2020-11-28: 60 mg via INTRAVENOUS
  Filled 2020-11-28: qty 6

## 2020-11-28 MED ORDER — POTASSIUM CHLORIDE CRYS ER 20 MEQ PO TBCR
30.0000 meq | EXTENDED_RELEASE_TABLET | Freq: Two times a day (BID) | ORAL | Status: DC
  Administered 2020-11-28 (×2): 30 meq via ORAL
  Filled 2020-11-28 (×2): qty 1

## 2020-11-28 MED ORDER — MAGNESIUM SULFATE IN D5W 1-5 GM/100ML-% IV SOLN
1.0000 g | Freq: Once | INTRAVENOUS | Status: AC
  Administered 2020-11-29: 1 g via INTRAVENOUS
  Filled 2020-11-28: qty 100

## 2020-11-28 NOTE — ED Triage Notes (Signed)
Pt here from home with c/o bil ankle swelling , pt states that he ran out of his meds a few days ago , gets his meds from the clinic

## 2020-11-28 NOTE — ED Provider Notes (Signed)
[facility] Provider Note   CSN: [id number] Arrival date & time: 11/28/20  1017     History No chief complaint on file.   Edward Mullen is a 65 y.o. male.  Presents to ER with concern for bilateral ankle swelling.  Patient reports that few days ago he ran out of all of his medications and has not been taking anything over the past few days.  Since stopping his medicines, he has noted significant increase in swelling in both of his legs.  Also notes significant this difficulty in breathing, worse with lying flat, worse with exertion.  Having some mild shortness of breath at rest as well.  No associated chest pain or chest tightness.  Endorses occasional cocaine use, last use was a couple weeks ago.  History of cardiomyopathy, EF 20-25%.  HPI     Past Medical History:  Diagnosis Date  . Arthritis    "feels like it in my legs" (09/20/2014)  . Borderline type 2 diabetes mellitus    patient denies  . CHF (congestive heart failure) ([place])   . Dysrhythmia   . Hypertension   . NSVT (nonsustained ventricular tachycardia) ([place]) 09/06/2016    Patient Active Problem List   Diagnosis Date Noted  . Acute HFrEF (heart failure with reduced ejection fraction) ([place]) 11/28/2020  . Scrotal mass 08/01/2020  . Nocturia 08/01/2020  . Heart failure ([place]) 07/27/2020  . Lumbar spinal stenosis 06/08/2019  . Healthcare maintenance 05/11/2019  . Housing problems 05/11/2019  . Difficulty urinating 04/07/2018  . Colon cancer screening 04/07/2018  . Anemia 12/19/2016  . CKD (chronic kidney disease) stage 3, GFR 30-59 ml/min (HCC) 12/13/2016  . Chronic systolic heart failure ([place]) 09/06/2016  . Non-ischemic cardiomyopathy ([place]) 08/27/2016  . 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 Mitral regurgitation 08/27/2016  . Polysubstance abuse ([place])   . Tobacco abuse 09/19/2014  . HTN (hypertension) 03/28/2011  . Bilateral lower extremity pain 03/28/2011    Past  Surgical History:  Procedure Laterality Date  . CARDIAC CATHETERIZATION N/A 08/27/2016   Procedure: Right/Left Heart Cath and Coronary Angiography;  Surgeon: Larey Dresser, MD;  Location: [place] [facility];  Service: Cardiovascular;  Laterality: N/A;  . COLONOSCOPY    . CYSTECTOMY Right    "back of my shoulder"  . TONSILLECTOMY         Family History  Problem Relation Age of Onset  . Stroke Mother 31  . Heart disease Father 48  . Colon cancer Neg Hx   . Colon polyps Neg Hx   . Esophageal cancer Neg Hx   . Stomach cancer Neg Hx   . Rectal cancer Neg Hx     Social History   Tobacco Use  . Smoking status: Current Every Day Smoker    Packs/day: 0.10    Years: 48.00    Pack years: 4.80    Types: Cigarettes  . Smokeless tobacco: Never Used  . Tobacco comment: cutting back 2  per day  Vaping Use  . Vaping Use: Never used  Substance Use Topics  . Alcohol use: Yes    Alcohol/week: 1.0 standard drink    Types: 1 Cans of beer per week    Comment: occasionally  . Drug use: Yes    Types: Marijuana    Comment: daily 4 times last 2 weeks ago as of 05/31/2019    Home Medications Prior to Admission medications   Medication Sig Start Date End Date Taking? Authorizing Provider  Blood Pressure  Monitor DEVI Please use to monitor blood pressure as directed.  ICD: I10 (hypertension) 08/03/20  Yes Agyei, Obed K, MD  eplerenone (INSPRA) 25 MG tablet TAKE 1 TABLET (25 MG TOTAL) BY MOUTH DAILY. Patient taking differently: Take 25 mg by mouth daily. 07/27/20 07/27/21 Yes Marianna Payment, MD  furosemide (LASIX) 20 MG tablet TAKE 1 TABLET (20 MG TOTAL) BY MOUTH DAILY. Patient taking differently: Take 20 mg by mouth daily. 07/27/20 07/27/21 Yes Marianna Payment, MD  isosorbide-hydrALAZINE (BIDIL) 20-37.5 MG tablet TAKE 2 TABLETS BY MOUTH 3 (THREE) TIMES A DAY Patient taking differently: Take 1 tablet by mouth in the morning and at bedtime. 07/27/20 07/27/21 Yes Marianna Payment, MD  rosuvastatin  (CRESTOR) 5 MG tablet TAKE 1 TABLET (5 MG TOTAL) BY MOUTH DAILY. 07/27/20 07/27/21 Yes Marianna Payment, MD  sacubitril-valsartan (ENTRESTO) 49-51 MG Take 1 tablet by mouth 2 (two) times daily. 07/27/20  Yes Marianna Payment, MD    Allergies    Patient has no known allergies.  Review of Systems   Review of Systems  Constitutional: Negative for chills and fever.  HENT: Negative for ear pain and sore throat.   Eyes: Negative for pain and visual disturbance.  Respiratory: Positive for shortness of breath. Negative for cough.   Cardiovascular: Positive for leg swelling. Negative for chest pain and palpitations.  Gastrointestinal: Negative for abdominal pain and vomiting.  Genitourinary: Negative for dysuria and hematuria.  Musculoskeletal: Negative for arthralgias and back pain.  Skin: Negative for color change and rash.  Neurological: Negative for seizures and syncope.  All other systems reviewed and are negative.   Physical Exam Updated Vital Signs BP 98/79 (BP Location: Left Arm)   Pulse 65   Temp 97.9 F (36.6 C)   Resp 18   Ht 6' (1.829 m)   Wt 87.6 kg Comment: scale b  SpO2 96%   BMI 26.19 kg/m   Physical Exam Vitals and nursing note reviewed.  Constitutional:      Appearance: He is well-developed.  HENT:     Head: Normocephalic and atraumatic.  Eyes:     Conjunctiva/sclera: Conjunctivae normal.  Cardiovascular:     Rate and Rhythm: Normal rate and regular rhythm.     Heart sounds: No murmur heard.   Pulmonary:     Comments: Mild tachypnea but no distress Abdominal:     Palpations: Abdomen is soft.     Tenderness: There is no abdominal tenderness.  Musculoskeletal:     Cervical back: Neck supple.     Comments: Bilateral edema in lower legs extending to upper lower legs, pitting  Skin:    General: Skin is warm and dry.  Neurological:     Mental Status: He is alert.     ED Results / Procedures / Treatments   Labs (all labs ordered are listed, but only  abnormal results are displayed) Labs Reviewed  BASIC METABOLIC PANEL - Abnormal; Notable for the following components:      Result Value   CO2 21 (*)    BUN 31 (*)    Creatinine, Ser 1.61 (*)    Calcium 8.4 (*)    GFR, Estimated 47 (*)    All other components within normal limits  CBC - Abnormal; Notable for the following components:   RDW 16.1 (*)    All other components within normal limits  BRAIN NATRIURETIC PEPTIDE - Abnormal; Notable for the following components:   B Natriuretic Peptide 2,565.3 (*)    All other components within normal  limits  BASIC METABOLIC PANEL - Abnormal; Notable for the following components:   BUN 25 (*)    Creatinine, Ser 1.49 (*)    Calcium 8.3 (*)    GFR, Estimated 51 (*)    All other components within normal limits  TROPONIN I (HIGH SENSITIVITY) - Abnormal; Notable for the following components:   Troponin I (High Sensitivity) 36 (*)    All other components within normal limits  TROPONIN I (HIGH SENSITIVITY) - Abnormal; Notable for the following components:   Troponin I (High Sensitivity) 43 (*)    All other components within normal limits  TROPONIN I (HIGH SENSITIVITY) - Abnormal; Notable for the following components:   Troponin I (High Sensitivity) 32 (*)    All other components within normal limits  TROPONIN I (HIGH SENSITIVITY) - Abnormal; Notable for the following components:   Troponin I (High Sensitivity) 39 (*)    All other components within normal limits  SARS CORONAVIRUS 2 (TAT 6-24 HRS)  HIV ANTIBODY (ROUTINE TESTING W REFLEX)  MAGNESIUM    EKG EKG Interpretation  Date/Time:  Tuesday November 28 2020 10:29:49 EDT Ventricular Rate:  90 PR Interval:  204 QRS Duration: 144 QT Interval:  418 QTC Calculation: 511 R Axis:   -72 Text Interpretation: Sinus rhythm with frequent Premature ventricular complexes and Premature atrial complexes Left axis deviation Left bundle branch block Abnormal ECG No significant change since last tracing  Confirmed by Madalyn Rob [phone]) on 11/28/2020 10:52:47 AM   Radiology DG Chest 2 View  Result Date: 11/28/2020 CLINICAL DATA:  Shortness of breath.  Congestive heart failure EXAM: CHEST - 2 VIEW COMPARISON:  07/27/2020 FINDINGS: Stable cardiomegaly. Pulmonary vascularity is within normal limits. No airspace consolidation, pleural effusion, or pneumothorax. No overt edema. IMPRESSION: Cardiomegaly. No acute cardiopulmonary process. Electronically Signed   By: Davina Poke D.O.   On: 11/28/2020 11:19    Procedures Procedures   Medications Ordered in ED Medications  potassium chloride (KLOR-CON) CR tablet 30 mEq (30 mEq Oral Given 11/28/20 2205)  rosuvastatin (CRESTOR) tablet 5 mg (5 mg Oral Given 11/28/20 1612)  sacubitril-valsartan (ENTRESTO) 49-51 mg per tablet (1 tablet Oral Given 11/28/20 1612)  sodium chloride flush (NS) 0.9 % injection 3 mL (3 mLs Intravenous Given 11/28/20 2205)  sodium chloride flush (NS) 0.9 % injection 3 mL (has no administration in time range)  0.9 %  sodium chloride infusion (has no administration in time range)  acetaminophen (TYLENOL) tablet 650 mg (has no administration in time range)  ondansetron (ZOFRAN) injection 4 mg (has no administration in time range)  enoxaparin (LOVENOX) injection 40 mg (40 mg Subcutaneous Given 11/28/20 1813)  furosemide (LASIX) injection 40 mg (40 mg Intravenous Given 11/29/20 0639)  furosemide (LASIX) injection 60 mg (60 mg Intravenous Given 11/28/20 1237)  furosemide (LASIX) injection 20 mg (20 mg Intravenous Given 11/28/20 2004)  magnesium sulfate IVPB 1 g 100 mL (1 g Intravenous New Bag/Given 11/29/20 0041)    ED Course  I have reviewed the triage vital signs and the nursing notes.  Pertinent labs & imaging results that were available during my care of the patient were reviewed by me and considered in my medical decision making (see chart for details).    MDM Rules/Calculators/A&P                         65 year old  male with extensive past medical history notable for heart failure presenting to ER with concerns for lower  extremity edema and dyspnea after running out of his medications.  Based on physical exam, suspect heart failure exacerbation.  BNP was significantly elevated, his CXR looks okay.  Given the significant edema on exam and BNP, believe patient would benefit from admission for IV diuresis and further observation.  Consulted internal medicine for admission.   After the discussed management above, the patient was determined to be safe for discharge.  The patient was in agreement with this plan and all questions regarding their care were answered.  ED return precautions were discussed and the patient will return to the ED with any significant worsening of condition.   Final Clinical Impression(s) / ED Diagnoses Final diagnoses:  Acute on chronic congestive heart failure, unspecified heart failure type [facility])    Rx / DC Orders ED Discharge Orders    None       Lucrezia Starch, MD 11/29/20 [phone]

## 2020-11-28 NOTE — H&P (Signed)
Date: 11/28/2020               Patient Name:  Edward Mullen MRN: [id number]  DOB: 04/15/1956 Age / Sex: 65 y.o., male   PCP: Harvie Heck, MD         Medical Service: Internal Medicine Teaching Service         Attending Physician: Dr. Velna Ochs, MD    First Contact: Dr. Allyson Sabal Pager: [phone]  Second Contact: Dr. Charleen Kirks Pager: [phone]       After Hours (After 5p/  First Contact Pager: [phone]  weekends / holidays): Second Contact Pager: [phone]   Chief Complaint: shortness of breath   History of Present Illness: Edward Mullen is a 65 y/o gentleman with history of combined systolic and diastolic HF (most recent EF 07/2020 showed EF of 25%, grade III diastolic heart failure), HTN, CKD IIIa, and polysubstance use who presents with 1 week history of progressive orthopnea, DOE and lower extremity swelling. Patient notes he smoked cocaine last Wednesday and developed symptoms shortly thereafter. He also ran out of his medications 2 days later. Also endorses productive cough, intermittent atypical chest pain and light headedness with exertion. He has not been weighing himself at home.  Denies syncope, headaches, fevers, chills, URI symptoms, palpitations, abdominal pain or swelling, n/v, changes in BMs, urinary symptoms, focal weakness, numbness/tingling.   Meds:  Current Meds  Medication Sig  . Blood Pressure Monitor DEVI Please use to monitor blood pressure as directed.  ICD: I10 (hypertension)  . eplerenone (INSPRA) 25 MG tablet TAKE 1 TABLET (25 MG TOTAL) BY MOUTH DAILY. (Patient taking differently: Take 25 mg by mouth daily.)  . furosemide (LASIX) 20 MG tablet TAKE 1 TABLET (20 MG TOTAL) BY MOUTH DAILY. (Patient taking differently: Take 20 mg by mouth daily.)  . isosorbide-hydrALAZINE (BIDIL) 20-37.5 MG tablet TAKE 2 TABLETS BY MOUTH 3 (THREE) TIMES A DAY (Patient taking differently: Take 1 tablet by mouth in the morning and at bedtime.)  . rosuvastatin (CRESTOR) 5 MG  tablet TAKE 1 TABLET (5 MG TOTAL) BY MOUTH DAILY.  . sacubitril-valsartan (ENTRESTO) 49-51 MG Take 1 tablet by mouth 2 (two) times daily.     Allergies: Allergies as of 11/28/2020  . (No Known Allergies)   Past Medical History:  Diagnosis Date  . Arthritis    "feels like it in my legs" (09/20/2014)  . Borderline type 2 diabetes mellitus    patient denies  . CHF (congestive heart failure) ([employer])   . Dysrhythmia   . Hypertension   . NSVT (nonsustained ventricular tachycardia) ([place]) 09/06/2016    Family History:  Family History  Problem Relation Age of Onset  . Stroke Mother 37  . Heart disease Father 53  . Colon cancer Neg Hx   . Colon polyps Neg Hx   . Esophageal cancer Neg Hx   . Stomach cancer Neg Hx   . Rectal cancer Neg Hx      Social History: lives at home by himself. Smokes 2 cigarettes per day, no EtOH use. Smokes cocaine once a month (most recently 4/13) and smokes marijuana every other day  Review of Systems: A complete ROS was negative except as per HPI.   Physical Exam: Blood pressure 121/88, pulse 81, resp. rate 12, height 6' (1.829 m), weight 90.7 kg, SpO2 99 %. General: awake, alert, sitting up in bed, NAD HEENT: [name]/AT; EOMI. Oropharynx without erythema or exudates  Neck: +JVD CV: RRR; no m/r/g Pulm: dyspneic with  conversation and getting up to use urinal. Lung sounds rhonchorous with bibasilar rales  Abd: BS+; abdomen soft, non-tender, non-distended Ext: warm and well perfused; 2+ edema to BLE up to knees  Neuro: A&Ox4; non-focal   EKG: personally reviewed my interpretation is sinus rhythm, LBBB which is consistent with previous readings, frequent PVCs   CXR: personally reviewed my interpretation is vascular congestions. No effusions or focal infiltrates   Assessment & Plan by Problem: Active Problems:   Acute HFrEF (heart failure with reduced ejection fraction) [facility])  Edward Mullen is a 65 y/o gentleman with history of combined systolic and  diastolic HF (most recent EF 07/2020 showed EF of 25%, grade III diastolic heart failure), HTN, CKD IIIa, and polysubstance use who presents with 1 week history of progressive orthopnea, DOE and lower extremity swelling. Work-up in the ED significant for mild AKI (crt 1.6 up from baseline of 1.2), BNP > 2500, and mildly elevated troponin. He was admitted for acute on chronic heart failure exacerbation   Acute on chronic HFrEF -likely in the setting of recent cocaine use and running out of home medications -he is not requiring oxygen but is quite symptomatic with DOE and orthopnea  -he received a dose of IV lasix 60 mg which he is responding well to; will continue IV lasix 40 mg q 12  -strict I&Os; daily weights -trend BMP; keep K >4, Mag > 2 -last echo on previous admission last December which showed reduced EF of 20- 25%  -continue home Entresto -holding home Bidil for low-normal blood pressures while we diurese -it appears he is not on beta blocker due to cocaine use   NSVT -noted in ED to have 4 beat run of V tach which was asymptomatic -monitoring electrolytes as above  -with his low EF, he would be a candidate for ICD. Consider cardiology consult to have this placed this admission   Acute on chronic CKD IIIa -likely in the setting of volume overload -will trend renal function while diuresing   DVT ppx: lovenox Diet: HH CODE: FULL   Dispo: Admit patient to Inpatient with expected length of stay greater than 2 midnights.  SignedDelice Bison, DO 11/28/2020, 2:45 PM  Pager: [phone] After 5pm on weekdays and 1pm on weekends: On Call pager: [phone]

## 2020-11-29 ENCOUNTER — Inpatient Hospital Stay (HOSPITAL_COMMUNITY): Payer: Medicare (Managed Care)

## 2020-11-29 DIAGNOSIS — I5021 Acute systolic (congestive) heart failure: Secondary | ICD-10-CM

## 2020-11-29 DIAGNOSIS — J449 Chronic obstructive pulmonary disease, unspecified: Secondary | ICD-10-CM | POA: Diagnosis not present

## 2020-11-29 DIAGNOSIS — I5043 Acute on chronic combined systolic (congestive) and diastolic (congestive) heart failure: Secondary | ICD-10-CM

## 2020-11-29 DIAGNOSIS — I472 Ventricular tachycardia: Secondary | ICD-10-CM

## 2020-11-29 DIAGNOSIS — N1831 Chronic kidney disease, stage 3a: Secondary | ICD-10-CM

## 2020-11-29 DIAGNOSIS — R339 Retention of urine, unspecified: Secondary | ICD-10-CM | POA: Diagnosis not present

## 2020-11-29 LAB — ECHOCARDIOGRAM LIMITED
Height: 72 in
P 1/2 time: 549 msec
S' Lateral: 6.5 cm
Weight: 3089.97 oz

## 2020-11-29 LAB — BASIC METABOLIC PANEL
Anion gap: 10 (ref 5–15)
Anion gap: 4 — ABNORMAL LOW (ref 5–15)
BUN: 25 mg/dL — ABNORMAL HIGH (ref 8–23)
BUN: 22 mg/dL (ref 8–23)
CO2: 24 mmol/L (ref 22–32)
CO2: 30 mmol/L (ref 22–32)
Calcium: 8.3 mg/dL — ABNORMAL LOW (ref 8.9–10.3)
Calcium: 8.1 mg/dL — ABNORMAL LOW (ref 8.9–10.3)
Chloride: 108 mmol/L (ref 98–111)
Chloride: 108 mmol/L (ref 98–111)
Creatinine, Ser: 1.49 mg/dL — ABNORMAL HIGH (ref 0.61–1.24)
Creatinine, Ser: 1.51 mg/dL — ABNORMAL HIGH (ref 0.61–1.24)
GFR, Estimated: 51 mL/min — ABNORMAL LOW (ref >60)
GFR, Estimated: 51 mL/min — ABNORMAL LOW (ref >60)
Glucose, Bld: 87 mg/dL (ref 70–99)
Glucose, Bld: 130 mg/dL — ABNORMAL HIGH (ref 70–99)
Potassium: 3.6 mmol/L (ref 3.5–5.1)
Potassium: 4 mmol/L (ref 3.5–5.1)
Sodium: 142 mmol/L (ref 135–145)
Sodium: 142 mmol/L (ref 135–145)

## 2020-11-29 LAB — MAGNESIUM: Magnesium: 1.7 mg/dL (ref 1.7–2.4)

## 2020-11-29 MED ORDER — IPRATROPIUM-ALBUTEROL 0.5-2.5 (3) MG/3ML IN SOLN
3.0000 mL | Freq: Two times a day (BID) | RESPIRATORY_TRACT | Status: DC
  Administered 2020-11-29 – 2020-11-30 (×4): 3 mL via RESPIRATORY_TRACT
  Filled 2020-11-29 (×4): qty 3

## 2020-11-29 MED ORDER — POTASSIUM CHLORIDE CRYS ER 20 MEQ PO TBCR
80.0000 meq | EXTENDED_RELEASE_TABLET | Freq: Once | ORAL | Status: AC
  Administered 2020-11-29: 80 meq via ORAL
  Filled 2020-11-29: qty 4

## 2020-11-29 MED ORDER — FUROSEMIDE 10 MG/ML IJ SOLN: 40.0000 mg | Freq: Two times a day (BID) | INTRAMUSCULAR | Status: DC

## 2020-11-29 MED ORDER — POTASSIUM CHLORIDE CRYS ER 20 MEQ PO TBCR
40.0000 meq | EXTENDED_RELEASE_TABLET | Freq: Once | ORAL | Status: AC
  Administered 2020-11-29: 40 meq via ORAL
  Filled 2020-11-29: qty 2

## 2020-11-29 MED ORDER — MAGNESIUM SULFATE 2 GM/50ML IV SOLN
2.0000 g | Freq: Once | INTRAVENOUS | Status: AC
  Administered 2020-11-29: 2 g via INTRAVENOUS
  Filled 2020-11-29: qty 50

## 2020-11-29 MED ORDER — POTASSIUM CHLORIDE CRYS ER 20 MEQ PO TBCR: 40.0000 meq | EXTENDED_RELEASE_TABLET | Freq: Two times a day (BID) | ORAL | Status: DC

## 2020-11-29 MED ORDER — POLYETHYLENE GLYCOL 3350 17 G PO PACK
17.0000 g | PACK | Freq: Every day | ORAL | Status: DC
  Administered 2020-11-29 – 2020-12-05 (×5): 17 g via ORAL
  Filled 2020-11-29 (×7): qty 1

## 2020-11-29 MED ORDER — FUROSEMIDE 10 MG/ML IJ SOLN
40.0000 mg | Freq: Two times a day (BID) | INTRAMUSCULAR | Status: DC
  Administered 2020-11-29 – 2020-12-01 (×4): 40 mg via INTRAVENOUS
  Filled 2020-11-29 (×5): qty 4

## 2020-11-29 NOTE — Plan of Care (Signed)

## 2020-11-29 NOTE — Progress Notes (Signed)
Patient having multiple episodes of asymptomatic ectopy starting around 2200 last night.  No chest pain or shortness of breath noted, and patient denies palpitations.  Initial episode 17 beats of Vtach.  MD notified, EKG done.  MD ordered 1gm mag sulfate.  Given.  Significantly less episodes of ectopy overnight after mag infusion given.    Additionally patient complaining of difficulty with emptying bladder and reports similar issues at home.  Urine is clear yellow.  Patient not on flomax or similar medications.  MD made aware.  Post void bladder scan done x2 so far tonight. Both <3102ml.

## 2020-11-29 NOTE — Progress Notes (Signed)
  Mobility Specialist Criteria Algorithm Info.  Mobility Team: HOB elevated: Activity: Ambulated in room; Ambulated to bathroom; Dangled on edge of bed Range of motion: Active; All extremities Level of assistance: Modified independent, requires aide device or extra time Assistive device: None Minutes sitting in chair:  Minutes stood: 2 minutes Minutes ambulated: 2 minutes Distance ambulated (ft): 30 ft Mobility response: Tolerated Well Bed Position: Semi-fowlers  Patient is independent with ambulation, transfers and ADL's. Declined further mobility outside of room due to pain in LE (non-specific).  11/29/2020 1:39 PM

## 2020-11-29 NOTE — Progress Notes (Addendum)
Subjective:   Edward Mullen states his swelling is improving since admission. He experienced his head getting hot when NSVT occurred.   Swelling started around Friday. He ran out of his medications.  He realizes that he needs to stop using cocaine.  Objective:  Vital signs in last 24 hours: Vitals:   11/28/20 1541 11/28/20 2034 11/29/20 0035 11/29/20 0422  BP: (!) 131/106 (!) 120/95 107/82 98/79  Pulse: 86 95 71 65  Resp: 18 20 20 18   Temp: (!) 97.5 F (36.4 C) 97.9 F (36.6 C) 97.9 F (36.6 C) 97.9 F (36.6 C)  TempSrc: Oral Oral Oral   SpO2: 100% 97% 100% 96%  Weight:   87.6 kg   Height:       General: Elderly male sitting up in bed, NAD. Cardiovascular: Normal rate and regular rhythm, no murmurs rubs or gallops noted.  2+ radial and pedal pulses bilaterally. Pulmonary: Rhonchorous lung sounds with bibasilar crackles, no acute respiratory distress noted. Abdomen: Soft, nontender, nondistended, bowel sounds normoactive MSK 2+ pitting edema in bilateral lower extremities up to the mid shin. Neuro: AAOx3, no focal deficits noted.  Assessment/Plan:  Active Problems:   Acute HFrEF (heart failure with reduced ejection fraction) (HCC)   Acute on chronic combined systolic and diastolic heart failure Last ECHO in 07/2020 showing LVEF 20-25% along with severely decreased LV function, global hypokinesis of LV, and grade 3 diastolic dysfunction. He is on a home regimen of Entresto 49-51mg  BID, bidil 20-37.5mg  TID, lasix 20mg  daily, and eplerenone 25mg  daily although unsure if he is fully compliant with his medications. He did run out of them about 5 days ago. He also has history of polysubstance use, with recent cocaine use on 4/13. On admission, BNP 2565 and CXR showing vascular congestion along with physical exam findings of volume overload. Current presentation likely from combination of recent cocaine use and running out of home medications. No recent dry weight seen on chart  review, but he does still appear volume overloaded on exam today.  -repeat ECHO today -continue IV lasix 40mg  BID -holding home entresto and bidil in setting of soft blood pressures and concomitant need for diuresis -weight 90.7kg on admission, today 87.6kg -2.1L of UOP over past 24 hours -maintain K>4.0 and Mg >2.0 in setting of diuresis, replete as needed -daily BMP and Mag -strict I/O's, daily standing weights  Acute on CKD IIIa Baseline creatinine appears to be around 1.4 on chart review. Baseline GFR appears to be around 47. On admission, Cr 1.61. Likely in the setting of volume overload from above. Produced 2.1L of UOP since yesterday. However, he is complaining of feeling obstructed when he tries to urinate and bladder scan showing ~239mL, so will in and out cath him to see if he is retaining urine. -Cr 1.61 > 1.49, improving with diuresis -avoid nephrotoxic medications -daily BMP -f/u in and out cath -if continues to have retention, may need to consider placement of foley cath  NSVT Patient with history of asymptomatic NSVT.  Had a 4 beat run in the ED and a 17 beat run overnight.  He was asymptomatic throughout aside from feeling hot.  EKG showed sinus rhythm with premature ventricular contractions and premature atrial contractions.  Given mag sulfate overnight with no reoccurrence of NSVT.  If continues to have recurrence of NSVT, may need to consider cardiology consult for possible ICD placement although patient does have history of substance use.  Undiagnosed COPD Patient with rhonchorous breath sounds on exam.  Chest x-ray showing hyperinflated lungs likely consistent with undiagnosed COPD given history of significant tobacco use (although unable to quantify amount and duration).  Presented with dyspnea on exertion which could be from his heart failure but given lung findings, will give duo nebs and assess for improvement. -DuoNebs twice daily  Polysubstance use Patient with  history of polysubstance use including significant tobacco use and intermittent cocaine use (uses cocaine 1 time monthly).  Would benefit from substance use counseling especially in regards to stopping cocaine use in the setting of his heart failure.  Prior to Admission Living Arrangement: Home Anticipated Discharge Location: TBD Barriers to Discharge: continued medical management Dispo: Anticipated discharge in approximately 2-3 day(s).   Edward Axe, MD 11/29/2020, 7:06 AM Pager: [phone] After 5pm on weekdays and 1pm on weekends: On Call pager [phone]

## 2020-11-29 NOTE — Progress Notes (Signed)
  Echocardiogram 2D Echocardiogram has been performed.  Bobbye Charleston 11/29/2020, 1:51 PM

## 2020-11-30 DIAGNOSIS — I5021 Acute systolic (congestive) heart failure: Secondary | ICD-10-CM

## 2020-11-30 DIAGNOSIS — N1831 Chronic kidney disease, stage 3a: Secondary | ICD-10-CM | POA: Diagnosis not present

## 2020-11-30 DIAGNOSIS — R339 Retention of urine, unspecified: Secondary | ICD-10-CM | POA: Diagnosis not present

## 2020-11-30 DIAGNOSIS — I5043 Acute on chronic combined systolic (congestive) and diastolic (congestive) heart failure: Secondary | ICD-10-CM | POA: Diagnosis not present

## 2020-11-30 DIAGNOSIS — J449 Chronic obstructive pulmonary disease, unspecified: Secondary | ICD-10-CM | POA: Diagnosis not present

## 2020-11-30 LAB — BASIC METABOLIC PANEL
Anion gap: 8 (ref 5–15)
BUN: 21 mg/dL (ref 8–23)
CO2: 28 mmol/L (ref 22–32)
Calcium: 8.5 mg/dL — ABNORMAL LOW (ref 8.9–10.3)
Chloride: 106 mmol/L (ref 98–111)
Creatinine, Ser: 1.55 mg/dL — ABNORMAL HIGH (ref 0.61–1.24)
GFR, Estimated: 49 mL/min — ABNORMAL LOW (ref >60)
Glucose, Bld: 106 mg/dL — ABNORMAL HIGH (ref 70–99)
Potassium: 4 mmol/L (ref 3.5–5.1)
Sodium: 142 mmol/L (ref 135–145)

## 2020-11-30 LAB — MAGNESIUM: Magnesium: 1.9 mg/dL (ref 1.7–2.4)

## 2020-11-30 MED ORDER — POTASSIUM CHLORIDE CRYS ER 20 MEQ PO TBCR
40.0000 meq | EXTENDED_RELEASE_TABLET | Freq: Once | ORAL | Status: AC
  Administered 2020-11-30: 40 meq via ORAL
  Filled 2020-11-30: qty 2

## 2020-11-30 MED ORDER — AMIODARONE HCL IN DEXTROSE 360-4.14 MG/200ML-% IV SOLN
60.0000 mg/h | INTRAVENOUS | Status: AC
  Administered 2020-11-30 (×2): 60 mg/h via INTRAVENOUS
  Filled 2020-11-30: qty 200

## 2020-11-30 MED ORDER — CHLORHEXIDINE GLUCONATE CLOTH 2 % EX PADS
6.0000 | MEDICATED_PAD | Freq: Every day | CUTANEOUS | Status: DC
  Administered 2020-11-30 – 2020-12-04 (×5): 6 via TOPICAL

## 2020-11-30 MED ORDER — LIDOCAINE HCL URETHRAL/MUCOSAL 2 % EX GEL
1.0000 "application " | Freq: Once | CUTANEOUS | Status: AC
  Administered 2020-11-30: 1 via URETHRAL
  Filled 2020-11-30: qty 11

## 2020-11-30 MED ORDER — AMIODARONE HCL IN DEXTROSE 360-4.14 MG/200ML-% IV SOLN
30.0000 mg/h | INTRAVENOUS | Status: DC
  Administered 2020-11-30 – 2020-12-01 (×2): 30 mg/h via INTRAVENOUS
  Filled 2020-11-30 (×2): qty 200

## 2020-11-30 MED ORDER — MAGNESIUM SULFATE 2 GM/50ML IV SOLN
2.0000 g | Freq: Once | INTRAVENOUS | Status: AC
  Administered 2020-11-30: 2 g via INTRAVENOUS
  Filled 2020-11-30: qty 50

## 2020-11-30 MED ORDER — LIDOCAINE HCL URETHRAL/MUCOSAL 2 % EX GEL: 1.0000 "application " | Freq: Once | CUTANEOUS | Status: DC

## 2020-11-30 MED ORDER — SPIRONOLACTONE 12.5 MG HALF TABLET
12.5000 mg | ORAL_TABLET | Freq: Every day | ORAL | Status: DC
  Administered 2020-11-30 – 2020-12-01 (×2): 12.5 mg via ORAL
  Filled 2020-11-30 (×2): qty 1

## 2020-11-30 MED ORDER — DIGOXIN 125 MCG PO TABS
0.1250 mg | ORAL_TABLET | Freq: Every day | ORAL | Status: DC
  Administered 2020-11-30 – 2020-12-05 (×6): 0.125 mg via ORAL
  Filled 2020-11-30 (×6): qty 1

## 2020-11-30 NOTE — TOC Initial Note (Addendum)
Transition of Care (TOC) - Initial/Assessment Note  Heart Failure   Patient Details  Name: SAYAN ALDAVA MRN: [id number] Date of Birth: [birth year]  [facility]) CM/SW Contact:    [place], [place] Phone Number: 11/30/2020, 4:11 PM  Clinical Narrative:      CSW spoke with patient at bedside and completed very brief SDOH screening with the patient who reported that he is roughly $3000 behind on his rent and the he was supposed to go to court on Tuesday but ended up in the hospital. Patient possibly facing eviction and it is unclear if he has a home to return to upon discharge. Mr. Meek states that his rent is $263 a month and would prefer if his rent would come straight out of his monthly check as he does receive enough to cover the cost, so that he wouldn't get behind on his bills. Mr. Petsch did admit to substance use concerns and stated he would be open to substance use resources. At this time it is unclear where his motivation for change with his substance use stands. Patient reports that he gets his medications at [facility] and the patient would benefit from a benefits check. He states that he gets his medications in a bubble pack which costs him $30/$40. Mr. Calame reports that he gets roughly $29 a month in Food Stamps which doesn't last him very long and has bus passes for transportation. CSW will enroll the patient in [employer]. CSW will look into resources and follow up with Mr. Pastorino.  TOC will continue to follow for d/c needs.         Barriers to Discharge: Continued Medical Work up   Patient Goals and CMS Choice        Expected Discharge Plan and Services   In-house Referral: Clinical Social Work     Living arrangements for the past 2 months: Apartment                                      Prior Living Arrangements/Services Living arrangements for the past 2 months: Apartment Lives with:: Self Patient language and  need for interpreter reviewed:: Yes Do you feel safe going back to the place where you live?: Yes      Need for Family Participation in Patient Care: No (Comment) Care giver support system in place?: No (comment)   Criminal Activity/Legal Involvement Pertinent to Current Situation/Hospitalization: No - Comment as needed  Activities of Daily Living      Permission Sought/Granted Permission sought to share information with : Case Manager                Emotional Assessment Appearance:: Appears stated age Attitude/Demeanor/Rapport: Engaged Affect (typically observed): Pleasant Orientation: : Oriented to Self,Oriented to Place,Oriented to  Time,Oriented to Situation Alcohol / Substance Use: Not Applicable Psych Involvement: No (comment)  Admission diagnosis:  Acute HFrEF (heart failure with reduced ejection fraction) ([place]) [I50.21] Patient Active Problem List   Diagnosis Date Noted  . Acute HFrEF (heart failure with reduced ejection fraction) ([place]) 11/28/2020  . Scrotal mass 08/01/2020  . Nocturia 08/01/2020  . Heart failure ([place]) 07/27/2020  . Lumbar spinal stenosis 06/08/2019  . Healthcare maintenance 05/11/2019  . Housing problems 05/11/2019  . Difficulty urinating 04/07/2018  . Colon cancer screening 04/07/2018  . Anemia 12/19/2016  . CKD (chronic kidney disease) stage 3, GFR 30-59 ml/min (HCC)  12/13/2016  . Chronic systolic heart failure ([place]) 09/06/2016  . Non-ischemic cardiomyopathy ([place]) 08/27/2016  . 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 Mitral regurgitation 08/27/2016  . Polysubstance abuse ([place])   . Tobacco abuse 09/19/2014  . HTN (hypertension) 03/28/2011  . Bilateral lower extremity pain 03/28/2011   PCP:  Harvie Heck, MD Pharmacy:   [place], [place] - [address] [place] [address] Phone: [phone] Fax: [phone]  [facility] [address] [place] [address] Phone: [phone] Fax: [phone]     Social Determinants of Health (SDOH) Interventions Food Insecurity Interventions: Intervention Not Indicated,Other (Comment) (Patient reports that he gets $29 in food stamps for the month which doesn't last him very long.) Financial Strain Interventions: Other (Comment) (Will look into resources and connect with HF outpatient CSW) Housing Interventions: Other (Comment) (Patient reports being $3000 behind on his rent) Transportation Interventions: Brewing technologist (Comment) (patient reports he has bus passes but [employer] would help him)  Readmission Risk Interventions Readmission Risk Prevention Plan 06/10/2019  Transportation Screening Complete  PCP or Specialist Appt within 5-7 Days Complete  Home Care Screening Complete  Medication Review (RN CM) Complete  Some recent data might be hidden   Monty Mccarrell, MSW, LCSWA [phone] Heart Failure Social Worker

## 2020-11-30 NOTE — Progress Notes (Addendum)
  Amiodarone Drug - Drug Interaction Consult Note  Recommendations:  Monitor for myopathy while on rosuvastatin  Reduce the dose of digoxin by 1/2 when starting amiodarone (per Amy Clegg, PA, continue current digoxin order)  Monitor, replace K, Mg while on furosemide  Monitor QTc, monitor and replace K, Mg while on ondansetron   Amiodarone is metabolized by the cytochrome P450 system and therefore has the potential to cause many drug interactions. Amiodarone has an average plasma half-life of 50 days (range 20 to 100 days).   There is potential for drug interactions to occur several weeks or months after stopping treatment and the onset of drug interactions may be slow after initiating amiodarone.   [x]  Statins: Increased risk of myopathy. Simvastatin- restrict dose to 20mg  daily. Other statins: counsel patients to report any muscle pain or weakness immediately.  []  Anticoagulants: Amiodarone can increase anticoagulant effect. Consider warfarin dose reduction. Patients should be monitored closely and the dose of anticoagulant altered accordingly, remembering that amiodarone levels take several weeks to stabilize.  []  Antiepileptics: Amiodarone can increase plasma concentration of phenytoin, the dose should be reduced. Note that small changes in phenytoin dose can result in large changes in levels. Monitor patient and counsel on signs of toxicity.  []  Beta blockers: increased risk of bradycardia, AV block and myocardial depression. Sotalol - avoid concomitant use.  []   Calcium channel blockers (diltiazem and verapamil): increased risk of bradycardia, AV block and myocardial depression.  []   Cyclosporine: Amiodarone increases levels of cyclosporine. Reduced dose of cyclosporine is recommended.  [x]  Digoxin dose should be halved when amiodarone is started.  [x]  Diuretics: increased risk of cardiotoxicity if hypokalemia occurs.  []  Oral hypoglycemic agents (glyburide, glipizide,  glimepiride): increased risk of hypoglycemia. Patient's glucose levels should be monitored closely when initiating amiodarone therapy.   [x]  Drugs that prolong the QT interval:  Torsades de pointes risk may be increased with concurrent use - avoid if possible.  Monitor QTc, also keep magnesium/potassium WNL if concurrent therapy can't be avoided. Marland Kitchen Antibiotics: e.g. fluoroquinolones, erythromycin. . Antiarrhythmics: e.g. quinidine, procainamide, disopyramide, sotalol. . Antipsychotics: e.g. phenothiazines, haloperidol.  . Lithium, tricyclic antidepressants, and methadone.  Ondansetron  Thank You,  Gillermina Hu, PharmD, BCPS, [facility] Clinical Pharmacist 11/30/2020 3:59 PM

## 2020-11-30 NOTE — Plan of Care (Signed)
  Problem: Activity: Goal: Risk for activity intolerance will decrease Outcome: Progressing   Problem: Nutrition: Goal: Adequate nutrition will be maintained Outcome: Progressing   Problem: Coping: Goal: Level of anxiety will decrease Outcome: Progressing   Problem: Elimination: Goal: Will not experience complications related to bowel motility Outcome: Progressing Goal: Will not experience complications related to urinary retention Outcome: Progressing   

## 2020-11-30 NOTE — Consult Note (Addendum)
Advanced Heart Failure Team Consult Note   Primary Physician: Harvie Heck, MD HF Cardiologist:  Dr Aundra Dubin  Reason for Consultation: A/C   HPI:    Edward Mullen is seen today for evaluation of A/C HFrEF at the request of Dr Philipp Ovens   Edward Mullen is a 65 year old with history of HTN, DM, chronic systolic CHF, NSVT, and polysubstance abuse.   Admitted 08/23/16 with acute systolic CHF in setting of substance abuse. Echo was done, showing EF 15-20%.  Diuresed with IV lasix and meds adjusted as tolerated.  UDS + for cocaine on admission. Cardiac cath showed coronary disease but not significant enough to explain cardiomyopathy. Down 23 lbs from admission weight with diuresis.  Discharge weight 193 lbs.  He was positive for cocaine again in 2/18.    He was admitted with syncope in 5/18 after using cocaine.  He was noted to have NSVT on telemetry.  Syncope suspected due to VT, no ICD given active substance abuse but had Lifevest placed. EF 50% on 8/18 echo, so Lifevest subsequently removed.  Over the last 6 months he has run out of his medications 4-5 different times. Says he is usually out of his meds for about 7 days. Last used cocaine 2 weeks ago. Smoking 2 cigarettes per day. Says he ran out of his medications on Friday because he didn't have refills or a way to pay for it.  Lives alone. Needs assistance with transportation. He has food stamps.   Presented to [facility] 11/28/20 with increased shortness of breath. CXR with cardiomegaly.  Pertinent BNP >2500, creatinine 1.6, HS Trop 36>32>39, hgb 8. He has been diuresing with IV lasix.  Weight trending down. Had ECHO this admit that showed EF 20-25%. He has been having multiple runs of NSVT.   Remains SOB with exertion.   Cardiac Studies  LHC/RHC (1/18): 80% stenosis in PLV branch.  Mean RA 5, PA 47/20 mean 32, mean PCWP 22, CI 2.01.   Echo 08/2016 (1/18) with EF 15-20%, moderate LVH, moderate AI, moderate to severe Edward, normal RV size with  mildly decreased systolic function, PASP 44 mmHg.  Echo (8/18): EF 50% with moderate LVH, diffuse hypokinesis, normal RV size and systolic function, mild AI, trivial Edward  . Complaining of cough. Y]; Weight loss [ ] ; Anorexia [ ] ; Fatigue [ Y]; Fever [ ] ; Chills [ ] ; Weakness [Y ]  . Cardiac: Chest pain/pressure [ ] ; Resting SOB [ ] ; Exertional SOB Y ]; Orthopnea [Y ]; Pedal Edema [ ] ; Palpitations [ ] ; Syncope [ ] ; Presyncope [ Y]; Paroxysmal nocturnal dyspnea[ ]   . Pulmonary: Cough [Y ]; Wheezing[ ] ; Hemoptysis[ ] ; Sputum [ ] ; Snoring [ ]   . GI: Vomiting[ ] ; Dysphagia[ ] ; Melena[ ] ; Hematochezia [ ] ; Heartburn[ ] ; Abdominal pain [ ] ; Constipation [ ] ; Diarrhea [ ] ; BRBPR [ ]   . GU: Hematuria[ ] ; Dysuria [ ] ; Nocturia[ ]   . Vascular: Pain in legs with walking [ ] ; Pain in feet with lying flat [ ] ; Non-healing sores [ ] ; Stroke [ ] ; TIA [ ] ; Slurred speech [ ] ;  . Neuro: Headaches[ ] ; Vertigo[ ] ; Seizures[ ] ; Paresthesias[ ] ;Blurred vision [ ] ; Diplopia [ ] ; Vision changes [ ]   . Ortho/Skin: Arthritis [ ] ; Joint pain [ Y]; Muscle pain [ ] ; Joint swelling [ ] ; Back Pain [ Y]; Rash [ ]   . Psych: Depression[ ] ; Anxiety[ ]   . Heme: Bleeding problems [ ] ; Clotting disorders [ ] ; Anemia [ ]   .  Endocrine: Diabetes [ Y]; Thyroid dysfunction[ ]   Home Medications Prior to Admission medications   Medication Sig Start Date End Date Taking? Authorizing Provider  Blood Pressure Monitor DEVI Please use to monitor blood pressure as directed.  ICD: I10 (hypertension) 08/03/20  Yes Agyei, Obed K, MD  eplerenone (INSPRA) 25 MG tablet TAKE 1 TABLET (25 MG TOTAL) BY MOUTH DAILY. Patient taking differently: Take 25 mg by mouth daily. 07/27/20 07/27/21 Yes Marianna Payment, MD  furosemide (LASIX) 20 MG tablet TAKE 1 TABLET (20 MG TOTAL) BY MOUTH DAILY. Patient taking differently: Take 20 mg by mouth daily. 07/27/20 07/27/21 Yes Marianna Payment, MD  isosorbide-hydrALAZINE (BIDIL) 20-37.5 MG tablet TAKE 2 TABLETS BY MOUTH 3  (THREE) TIMES A DAY Patient taking differently: Take 1 tablet by mouth in the morning and at bedtime. 07/27/20 07/27/21 Yes Marianna Payment, MD  rosuvastatin (CRESTOR) 5 MG tablet TAKE 1 TABLET (5 MG TOTAL) BY MOUTH DAILY. 07/27/20 07/27/21 Yes Marianna Payment, MD  sacubitril-valsartan (ENTRESTO) 49-51 MG Take 1 tablet by mouth 2 (two) times daily. 07/27/20  Yes Marianna Payment, MD    Past Medical History: Past Medical History:  Diagnosis Date  . Arthritis    "feels like it in my legs" (09/20/2014)  . Borderline type 2 diabetes mellitus    patient denies  . CHF (congestive heart failure) ([place] [facility])   . Dysrhythmia   . Hypertension   . NSVT (nonsustained ventricular tachycardia) ([place]) 09/06/2016    Past Surgical History: Past Surgical History:  Procedure Laterality Date  . CARDIAC CATHETERIZATION N/A 08/27/2016   Procedure: Right/Left Heart Cath and Coronary Angiography;  Surgeon: Larey Dresser, MD;  Location: [place] [facility];  Service: Cardiovascular;  Laterality: N/A;  . COLONOSCOPY    . CYSTECTOMY Right    "back of my shoulder"  . TONSILLECTOMY      Family History: Family History  Problem Relation Age of Onset  . Stroke Mother 44  . Heart disease Father 72  . Colon cancer Neg Hx   . Colon polyps Neg Hx   . Esophageal cancer Neg Hx   . Stomach cancer Neg Hx   . Rectal cancer Neg Hx     Social History: Social History   Socioeconomic History  . Marital status: Married    Spouse name: Not on file  . Number of children: Not on file  . Years of education: Not on file  . Highest education level: Not on file  Occupational History  . Occupation: Engineer, manufacturing systems    Comment: has not been able to work steadily for the last year or two  Tobacco Use  . Smoking status: Current Every Day Smoker    Packs/day: 0.10    Years: 48.00    Pack years: 4.80    Types: Cigarettes  . Smokeless tobacco: Never Used  . Tobacco comment: cutting back 2  per day  Vaping Use  . Vaping Use: Never  used  Substance and Sexual Activity  . Alcohol use: Yes    Alcohol/week: 1.0 standard drink    Types: 1 Cans of beer per week    Comment: occasionally  . Drug use: Yes    Types: Marijuana    Comment: daily 4 times last 2 weeks ago as of 05/31/2019  . Sexual activity: Yes    Birth control/protection: None  Other Topics Concern  . Not on file  Social History Narrative  . Not on file   Social Determinants of Health   Financial Resource  Strain: Not on file  Food Insecurity: Not on file  Transportation Needs: Not on file  Physical Activity: Not on file  Stress: Not on file  Social Connections: Not on file    Allergies:  No Known Allergies  Objective:    Vital Signs:   Temp:  [97.4 F (36.3 C)-98.3 F (36.8 C)] 97.9 F (36.6 C) (04/21 1116) Pulse Rate:  [77-86] 86 (04/21 1116) Resp:  [16-20] 19 (04/21 1116) BP: (105-110)/(75-84) 107/75 (04/21 1116) SpO2:  [96 %-100 %] 99 % (04/21 1116) Weight:  [83.7 kg] 83.7 kg (04/21 0510) Last BM Date: 11/30/20  Weight change: Filed Weights   11/28/20 1521 11/29/20 0035 11/30/20 0510  Weight: 90.5 kg 87.6 kg 83.7 kg    Intake/Output:   Intake/Output Summary (Last 24 hours) at 11/30/2020 1401 Last data filed at 11/30/2020 1308 Gross per 24 hour  Intake 721 ml  Output 4300 ml  Net -3579 ml      Physical Exam    General:   No resp difficulty HEENT: normal Neck: supple. JVP to ajw  . Carotids 2+ bilat; no bruits. No lymphadenopathy or thyromegaly appreciated. Cor: PMI nondisplaced. Regular rate & rhythm. No rubs  Murmurs. +S3 Lungs: Crackles in the bases on room air.  Abdomen: soft, nontender, nondistended. No hepatosplenomegaly. No bruits or masses. Good bowel sounds. Extremities: no cyanosis, clubbing, rash, R and LLE 1-2+ edema Neuro: alert & orientedx3, cranial nerves grossly intact. moves all 4 extremities w/o difficulty. Affect pleasant   Telemetry   SR with several runs of NSVT   EKG    SR :LBBB  Labs    Basic Metabolic Panel: Recent Labs  Lab 11/28/20 1042 11/28/20 1234 11/29/20 0327 11/29/20 1352 11/30/20 0424  NA 141  --  142 142 142  K 3.6  --  3.6 4.0 4.0  CL 110  --  108 108 106  CO2 21*  --  24 30 28   GLUCOSE 91  --  87 130* 106*  BUN 31*  --  25* 22 21  CREATININE 1.61*  --  1.49* 1.51* 1.55*  CALCIUM 8.4*  --  8.3* 8.1* 8.5*  MG  --  1.9  --  1.7 1.9    Liver Function Tests: No results for input(s): AST, ALT, ALKPHOS, BILITOT, PROT, ALBUMIN in the last 168 hours. No results for input(s): LIPASE, AMYLASE in the last 168 hours. No results for input(s): AMMONIA in the last 168 hours.  CBC: Recent Labs  Lab 11/28/20 1042  WBC 8.0  HGB 13.9  HCT 44.8  MCV 90.7  PLT 182    Cardiac Enzymes: No results for input(s): CKTOTAL, CKMB, CKMBINDEX, TROPONINI in the last 168 hours.  BNP: BNP (last 3 results) Recent Labs    07/27/20 0522 08/01/20 1046 11/28/20 1042  BNP 1,901.4* 447.6* 2,565.3*    ProBNP (last 3 results) No results for input(s): PROBNP in the last 8760 hours.   CBG: No results for input(s): GLUCAP in the last 168 hours.  Coagulation Studies: No results for input(s): LABPROT, INR in the last 72 hours.   Imaging    No results found.   Medications:     Current Medications: . Chlorhexidine Gluconate Cloth  6 each Topical Daily  . enoxaparin (LOVENOX) injection  40 mg Subcutaneous Q24H  . furosemide  40 mg Intravenous BID  . ipratropium-albuterol  3 mL Nebulization BID  . polyethylene glycol  17 g Oral Daily  . rosuvastatin  5 mg Oral Daily  .  sodium chloride flush  3 mL Intravenous Q12H     Infusions: . sodium chloride          Assessment/Plan   1. A/C HFrEF, NICM  Had LHC back 2018 - 80% stenosis in PLV branch.  EF was down back in early 2018 but improved with EF 50% later in 2018. EF is back down to 20-25% and likely multifactorial due to med noncompliance, cocaine abuse, LBBB, and PVC burden.   - He has been  diuresing with IV lasix. Needs additional diuresis. Continue IV lasix 40 mg twice a day.  - Add spiro 12.5 mg daily  - No bb for now.  - Add digoxin 0.125 mg daily - Anticipate adding SGT2i tomorrow.  - Follow renal function daily.  - Add ted hose.   2. NSVT  -Will need to start amiodarone to suppress PVC/NSVT burden.   3. CKD Stage IIIa  Creatinine baseline 1.4-1.6  Creatinine on admit 1.6, 1.4 today.   4.Cocaine Abuse Last used 2 weeks ago Discussed cessation.   5. Tobacco Abuse Discussed cessation   6. Social  Referred to HF SW to assist with med/transportation issues.    Consult cardiac rehab for hF.   Length of Stay: 2  Darrick Grinder, NP  11/30/2020, 2:01 PM  Advanced Heart Failure Team Pager [phone] (M-F; 7a - 5p)  Please contact [facility] for night-coverage after hours (4p -7a ) and weekends on [email or web address]  Patient seen and examined with the above-signed Advanced Practice Provider and/or Housestaff. I personally reviewed laboratory data, imaging studies and relevant notes. I independently examined the patient and formulated the important aspects of the plan. I have edited the note to reflect any of my changes or salient points. I have personally discussed the plan with the patient and/or family.  65 y/o male with polysubstance abuse, CKD 3a and systolic HF due to NICM with previously recovered EF now admitted with recurrent systolic HF with volume overload in setting of medication noncompliance. Now also having NSVT.   ECG with LBBB (slightly longer than previous). hstrop 30s and flat. Denies CP.   Responding well to IV lasix.   General:  Sitting up in bed  No resp difficulty HEENT: normal Neck: supple. JVP to jawCarotids 2+ bilat; no bruits. No lymphadenopathy or thryomegaly appreciated. Cor: PMI nondisplaced. Regular rate & rhythm. No rubs, gallops or murmurs. Lungs: clear Abdomen: soft, nontender, nondistended. No hepatosplenomegaly. No bruits or masses.  Good bowel sounds. Extremities: no cyanosis, clubbing, rash, 2+ edema Neuro: alert & orientedx3, cranial nerves grossly intact. moves all 4 extremities w/o difficulty. Affect pleasant  Suspect EF back down to polysubstance abuse and medication noncompliance but ischemic hear disease also a consideration particularly given increased ectopy.   Would continue diuresis. Titrate GDMT. Start amio. Supp K and Mg. Consider cath once better diuresed. Ideally would place LifeVest but given substance abuse may not be candidate.   Glori Bickers, MD  10:30 PM

## 2020-11-30 NOTE — Progress Notes (Signed)
CCMD notified RN that patient had a 13 beat run of VTACH nonsustaining. Upon assessment, patient is alert and oriented x 4, denies chest pain or shortness of breathe. Patient expresses that he just moved from the recliner chair to the bed. Call bell is within reach. Bed in lowest position.

## 2020-11-30 NOTE — Progress Notes (Signed)
   Subjective:   Edward Mullen reports feeling good today.  States that he has been peeing a lot.  No new complaints or concerns.  Objective:  Vital signs in last 24 hours: Vitals:   11/30/20 0510 11/30/20 0825 11/30/20 0828 11/30/20 1116  BP:   105/80 107/75  Pulse:   81 86  Resp:   18 19  Temp:   98.3 F (36.8 C) 97.9 F (36.6 C)  TempSrc:   Oral Oral  SpO2:  96% 96% 99%  Weight: 83.7 kg     Height:       General: Elderly male sitting up in bed, NAD. Cardiovascular: Normal rate and regular rhythm, no murmurs rubs or gallops noted. Pulmonary: Mild rhonchorous lung sounds bilaterally with minimal bibasilar crackles, no acute respiratory distress noted. GU: Coud cath in place MSK: 1-2+ pitting edema in bilateral lower extremities up to mid shin. Neuro: AAOx3, no focal deficits noted  Assessment/Plan:  Active Problems:   Acute HFrEF (heart failure with reduced ejection fraction) (HCC)   Acute on chronic combined systolic and diastolic heart failure Likely secondary to recent cocaine use and running out of home medications.  Echo on 11/29/2020 showing LVEF 20-25% along with severely decreased LV function, global hypokinesis, moderate concentric LVH.  LV diastolic function could not be evaluated.  Holding his home medications due to soft blood pressures.  He is still volume overloaded on exam today but with improvement.  Unfortunately cannot increase IV Lasix dose due to soft blood pressures.  Consulted cardiology for assistance with diuresis in the setting of soft BP, greatly appreciate assistance. -2.3 L of urine output over past 24 hours -Continue IV Lasix 40 mg twice daily -Weight on admission 90.7 kg, today weight at 83.7 kg -Maintain K>4.0 and Mg>2.0 setting of diuresis, replete as needed -Daily BMP and mag -Strict I/O's, daily standing weights  Acute on CKD IIIa Baseline creatinine appears to be around 1.4 on chart review. Baseline GFR appears to be around 47. On  admission, Cr 1.61. Likely in the setting of volume overload from above.  Coud catheter in place with good urine output. -Cr 1.55 this a.m. -avoid nephrotoxic medications -daily BMP -Follow-up PSA  NSVT Patient with history of asymptomatic NSVT.  Had a 4 beat run in the ED and a 17 beat run overnight.  He was asymptomatic throughout aside from feeling hot.  EKG showed sinus rhythm with premature ventricular contractions and premature atrial contractions.    Undiagnosed COPD Patient with rhonchorous breath sounds on exam.  Chest x-ray showing hyperinflated lungs likely consistent with undiagnosed COPD given history of significant tobacco use (although unable to quantify amount and duration). -DuoNebs twice daily  Polysubstance use Patient with history of polysubstance use including significant tobacco use and intermittent cocaine use (uses cocaine 1 time monthly).  Would benefit from substance use counseling especially in regards to stopping cocaine use in the setting of his heart failure.  Prior to Admission Living Arrangement: Home Anticipated Discharge Location: TBD Barriers to Discharge: continued medical management Dispo: Anticipated discharge in approximately 2-3 day(s).   Virl Axe, MD 11/30/2020, 1:30 PM Pager: [phone] After 5pm on weekdays and 1pm on weekends: On Call pager [phone]

## 2020-11-30 NOTE — Progress Notes (Signed)
Patient continued to complain of urinary frequency at start of shift. Adequate urine output but voiding small amounts q1h.  In and Out cath attempted on day shift and patient reported significant dscomfort and no urine obtained. PVR scan also showed 378ml in bladder.  Patient reported history of prostate issues but unable to elaborate as to details.  MD notified.  New order placed for coude catheter placement.   Coude placed by other team member and 629ml initial urine output obtained.

## 2020-12-01 DIAGNOSIS — R339 Retention of urine, unspecified: Secondary | ICD-10-CM

## 2020-12-01 DIAGNOSIS — I5043 Acute on chronic combined systolic (congestive) and diastolic (congestive) heart failure: Secondary | ICD-10-CM | POA: Diagnosis not present

## 2020-12-01 DIAGNOSIS — J449 Chronic obstructive pulmonary disease, unspecified: Secondary | ICD-10-CM | POA: Diagnosis not present

## 2020-12-01 DIAGNOSIS — N1831 Chronic kidney disease, stage 3a: Secondary | ICD-10-CM | POA: Diagnosis not present

## 2020-12-01 LAB — BASIC METABOLIC PANEL
Anion gap: 9 (ref 5–15)
BUN: 20 mg/dL (ref 8–23)
CO2: 32 mmol/L (ref 22–32)
Calcium: 8.9 mg/dL (ref 8.9–10.3)
Chloride: 99 mmol/L (ref 98–111)
Creatinine, Ser: 1.72 mg/dL — ABNORMAL HIGH (ref 0.61–1.24)
GFR, Estimated: 43 mL/min — ABNORMAL LOW (ref >60)
Glucose, Bld: 97 mg/dL (ref 70–99)
Potassium: 4.7 mmol/L (ref 3.5–5.1)
Sodium: 140 mmol/L (ref 135–145)

## 2020-12-01 LAB — MAGNESIUM: Magnesium: 2.2 mg/dL (ref 1.7–2.4)

## 2020-12-01 LAB — PSA: Prostatic Specific Antigen: 2.31 ng/mL (ref 0.00–4.00)

## 2020-12-01 MED ORDER — FUROSEMIDE 40 MG PO TABS
40.0000 mg | ORAL_TABLET | Freq: Every day | ORAL | Status: DC
  Administered 2020-12-02: 40 mg via ORAL
  Filled 2020-12-01: qty 1

## 2020-12-01 MED ORDER — TRAZODONE HCL 50 MG PO TABS
50.0000 mg | ORAL_TABLET | Freq: Every day | ORAL | Status: DC
  Administered 2020-12-01 – 2020-12-04 (×4): 50 mg via ORAL
  Filled 2020-12-01 (×4): qty 1

## 2020-12-01 MED ORDER — EMPAGLIFLOZIN 10 MG PO TABS: 10.0000 mg | ORAL_TABLET | Freq: Every day | ORAL | Status: DC

## 2020-12-01 MED ORDER — EPLERENONE 25 MG PO TABS
25.0000 mg | ORAL_TABLET | Freq: Every day | ORAL | Status: DC
  Administered 2020-12-01 – 2020-12-05 (×5): 25 mg via ORAL
  Filled 2020-12-01 (×6): qty 1

## 2020-12-01 MED ORDER — AMIODARONE HCL 200 MG PO TABS
200.0000 mg | ORAL_TABLET | Freq: Two times a day (BID) | ORAL | Status: DC
  Administered 2020-12-01 – 2020-12-05 (×8): 200 mg via ORAL
  Filled 2020-12-01 (×8): qty 1

## 2020-12-01 MED ORDER — SODIUM CHLORIDE 0.9% FLUSH
3.0000 mL | Freq: Two times a day (BID) | INTRAVENOUS | Status: DC
  Administered 2020-12-01 – 2020-12-04 (×7): 3 mL via INTRAVENOUS

## 2020-12-01 MED ORDER — NON FORMULARY: 25.0000 mg | Freq: Every day | Status: DC

## 2020-12-01 NOTE — Care Management Important Message (Signed)
Important Message  Patient Details  Name: SAMULE LIFE MRN: [id number] Date of Birth: 1955/09/17   Medicare Important Message Given:  Yes     Shelda Altes 12/01/2020, 10:42 AM

## 2020-12-01 NOTE — Progress Notes (Addendum)
Advanced Heart Failure Rounding Note  PCP-Cardiologist: No primary care provider on file.   Subjective:    4/21--Started on amio drip to suppress PVCs. Diuresed with IV lasix. Negative 4 liters.   Creatinine 1.5>1.7    Feels better today.  Denies SOB.   Objective:   Weight Range: 81.8 kg Body mass index is 24.46 kg/m.   Vital Signs:   Temp:  [97.6 F (36.4 C)-98.3 F (36.8 C)] 97.9 F (36.6 C) (04/22 0300) Pulse Rate:  [74-90] 74 (04/22 0300) Resp:  [18-19] 18 (04/22 0300) BP: (98-112)/(75-90) 98/76 (04/22 0300) SpO2:  [96 %-99 %] 99 % (04/22 0300) Weight:  [81.8 kg] 81.8 kg (04/22 0500) Last BM Date: 11/30/20  Weight change: Filed Weights   11/29/20 0035 11/30/20 0510 12/01/20 0500  Weight: 87.6 kg 83.7 kg 81.8 kg    Intake/Output:   Intake/Output Summary (Last 24 hours) at 12/01/2020 0820 Last data filed at 12/01/2020 0723 Gross per 24 hour  Intake 1313.49 ml  Output 6000 ml  Net -4686.51 ml      Physical Exam    General:  Well appearing. No resp difficulty HEENT: Normal Neck: Supple. JVP 6-7 . Carotids 2+ bilat; no bruits. No lymphadenopathy or thyromegaly appreciated. Cor: PMI nondisplaced. Regular rate & rhythm. No rubs, gallops or murmurs. Lungs: Clear Abdomen: Soft, nontender, nondistended. No hepatosplenomegaly. No bruits or masses. Good bowel sounds. Extremities: No cyanosis, clubbing, rash, R and LLE trace edema Neuro: Alert & orientedx3, cranial nerves grossly intact. moves all 4 extremities w/o difficulty. Affect pleasant GU: foleu    Telemetry   SR with PVCs NSVT  EKG    N/A  Labs    CBC Recent Labs    11/28/20 1042  WBC 8.0  HGB 13.9  HCT 44.8  MCV 90.7  PLT [id number]   Basic Metabolic Panel Recent Labs    11/30/20 0424 12/01/20 0445  NA 142 140  K 4.0 4.7  CL 106 99  CO2 28 32  GLUCOSE 106* 97  BUN 21 20  CREATININE 1.55* 1.72*  CALCIUM 8.5* 8.9  MG 1.9 2.2   Liver Function Tests No results for input(s):  AST, ALT, ALKPHOS, BILITOT, PROT, ALBUMIN in the last 72 hours. No results for input(s): LIPASE, AMYLASE in the last 72 hours. Cardiac Enzymes No results for input(s): CKTOTAL, CKMB, CKMBINDEX, TROPONINI in the last 72 hours.  BNP: BNP (last 3 results) Recent Labs    07/27/20 0522 08/01/20 1046 11/28/20 1042  BNP 1,901.4* 447.6* 2,565.3*    ProBNP (last 3 results) No results for input(s): PROBNP in the last 8760 hours.   D-Dimer No results for input(s): DDIMER in the last 72 hours. Hemoglobin A1C No results for input(s): HGBA1C in the last 72 hours. Fasting Lipid Panel No results for input(s): CHOL, HDL, LDLCALC, TRIG, CHOLHDL, LDLDIRECT in the last 72 hours. Thyroid Function Tests No results for input(s): TSH, T4TOTAL, T3FREE, THYROIDAB in the last 72 hours.  Invalid input(s): FREET3  Other results:   Imaging     No results found.   Medications:     Scheduled Medications: . Chlorhexidine Gluconate Cloth  6 each Topical Daily  . digoxin  0.125 mg Oral Daily  . enoxaparin (LOVENOX) injection  40 mg Subcutaneous Q24H  . furosemide  40 mg Intravenous BID  . polyethylene glycol  17 g Oral Daily  . rosuvastatin  5 mg Oral Daily  . sodium chloride flush  3 mL Intravenous Q12H  . spironolactone  12.5 mg Oral Daily  . traZODone  50 mg Oral QHS     Infusions: . sodium chloride    . amiodarone 30 mg/hr (11/30/20 2250)     PRN Medications:  sodium chloride, acetaminophen, ondansetron (ZOFRAN) IV, sodium chloride flush     Assessment/Plan  1. A/C HFrEF, NICM  Had LHC back 2018 - 80% stenosis in PLV branch.  EF was down back in early 2018 but improved with EF 50% later in 2018. EF is back down to 20-25% and likely multifactorial due to med noncompliance, cocaine abuse, LBBB, and PVC burden.   - Volume status much improved. Stop IV lasix.  - Continue spiro 12.5 mg daily  - No bb for now.  - Continue digoxin 0.125 mg daily - Anticipate adding SGT2i soon.    - Follow renal function daily.  - Intolerant ted hose.   2. NSVT  -Will need to start amiodarone to suppress PVC/NSVT burden.  - Continue amio drip - Mag 2.2 today. K 4.7  ? Life Vest.   3. CKD Stage IIIa  Creatinine baseline 1.4-1.6  Creatinine on admit 1.6, 1.7  today.   4.Cocaine Abuse Last used 2 weeks ago Discussed cessation.   5. Tobacco Abuse Discussed cessation   6. Social  Referred to HF SW to assist with med/transportation issues.   Consult cardiac rehab.    Length of Stay: 3  Amy Clegg, NP  12/01/2020, 8:20 AM  Advanced Heart Failure Team Pager [phone] (M-F; 7a - 5p)  Please contact [facility] for night-coverage after hours (5p -7a ) and weekends on [email or web address]  Patient seen with NP, agree with the above note.    SBP running 80s-100s on amiodarone gtt currently. Feeling "hot flashes."  Excellent diuresis yesterday with weight down.  General: NAD Neck: JVP 8 cm, no thyromegaly or thyroid nodule.  Lungs: Clear to auscultation bilaterally with normal respiratory effort. CV: Nondisplaced PMI.  Heart regular S1/S2, +S3, 2/6 HSM apex.  1+ ankle edema.  Abdomen: Soft, nontender, no hepatosplenomegaly, no distention.  Skin: Intact without lesions or rashes.  Neurologic: Alert and oriented x 3.  Psych: Normal affect. Extremities: No clubbing or cyanosis.  HEENT: Normal.     1. Acute on chronic systolic CHF: EF 85-27% with moderate to severe MR on echo 1/18. Nonischemic cardiomyopathy. HTN vs cocaine use vs viral vs ETOH. HIV negative, SPEP with no M-spike. Patient did have CAD but not enough to explain cardiomyopathy on 2018 cath. Echo in 8/18 showed EF up to 50%.  Patient was lost to followup in HF clinic, now back with CHF and echo showing EF 20-25%, moderate LVH, normal RV.  He has been off his meds, back on cocaine.  Still has wide LBBB.  Drop in EF may be due to recurrent cocaine abuse, medication noncompliance, and LBBB (nonischemic  cardiomyopathy).  However, he also has frequent NSVT runs and had CAD on prior cath.  Cannot rule out worsening CAD.  He diuresed very well yesterday, weight down significantly.  Creatinine mildly higher at 1.72. BP low.  - Transition to eplerenone rather than spironolactone given prior breast tenderness.     - Continue digoxin 0.125 daily.  - BP too low for Entresto right now.  - With rise in creatinine, hold Lasix today and restart 40 mg daily tomorrow.  - Needs to cut out cocaine totally.  - RHC/LHC probably Monday when creatinine is stabilized.  - Not going to be an ICD candidate until he is drug-free.  2. HTN: BP now running low.     3. Cocaine abuse:  He has restarted cocaine use.  I encouraged him to stay off cocaine.    4. NSVT:  Frequent runs.  Amiodarone gtt started but BP now running low and he is feeling "flushed."  - Transition amiodarone to 200.  5. CKD: Stage 3.  Follow creatinine closely.   6. CAD: 80% stenosis PLV on cath in 2018.  No ischemic chest pain, but EF has dropped again and he has frequent NSVT.   - Continue ASA 81 daily and Crestor 5 mg daily.   - LHC/RHC as above on Monday.   Loralie Champagne 12/01/2020 3:11 PM

## 2020-12-01 NOTE — Progress Notes (Signed)
Patient put on call light this am to inform writer that urine output in foley bag was red tinged and bloody.   Patient assessed and foley tubing found to be pulled quite taut at [place].  Adjusted tubing and spoke with patient about risk of trauma with foley insertion and potential for bleeding.  Foley output only slightly pink-tinged without any clots or sediment noted.Marland Kitchen

## 2020-12-01 NOTE — Progress Notes (Signed)
   Subjective:   No acute overnight events.  Reports feeling good.  Denies any chest pain or palpitations.  Was able to move to chair.  States that his swelling has gotten a lot better.  Objective:  Vital signs in last 24 hours: Vitals:   11/30/20 1612 11/30/20 2040 11/30/20 2102 12/01/20 0300  BP: 106/79  112/90 98/76  Pulse: 85  90 74  Resp: 18  18 18   Temp: 97.6 F (36.4 C)  97.9 F (36.6 C) 97.9 F (36.6 C)  TempSrc: Oral  Oral Oral  SpO2: 98% 99% 98% 99%  Weight:      Height:       General: Elderly male sitting up in chair, NAD. Cardiovascular: Normal rate and regular rhythm, no murmurs rubs or gallops. Pulmonary: Clear to auscultation bilaterally, respiratory effort is unlabored. MSK: 1+ bilateral lower extremity pitting edema. Neuro: AAOx3, no focal deficits noted.  Assessment/Plan:  Active Problems:   Acute HFrEF (heart failure with reduced ejection fraction) (HCC)   Acute on chronic combined systolic and diastolic heart failure Likely secondary to recent cocaine use and running out of home medications.  Still slightly volume overloaded on exam but with significant improvement.  Cardiology following, appreciate assistance.  Creatinine slightly increased from yesterday, will hold IV Lasix today per cardiology. -5.2 L of urine output over past 24 hours -Holding IV Lasix today -Weight on admission 90.7 kg, today weight at 81.8 kg -Maintaining K> 4.0 and Mg> 2.0 in the setting of diuresis, replete as needed -Daily BMP and magnesium -Strict I/O's, daily standing weights -Started on digoxin for symptom control per cardiology -Cardiology may consider catheterization once optimally diuresed  Acute on CKD IIIa Baseline creatinine appears to be around 1.4 on chart review. Baseline GFR appears to be around 47. On admission, Cr 1.61.  Coud catheter in place with good urine output.  Creatinine increased today, possibly from overdiuresis so will hold IV Lasix today as per  cardiology. -Cr 1.55 > 1.72 this a.m.  -avoid nephrotoxic medications -daily BMP -Follow-up PSA  NSVT Patient with history of asymptomatic NSVT.  Had a 4 beat run in the ED and a 17 beat run overnight.  He was asymptomatic throughout aside from feeling hot.  Patient was having frequent PVCs yesterday.  Cardiology started patient on amiodarone drip to reduce PVC burden.  Undiagnosed COPD Patient's pulmonary exam much improved since admission, no rhonchorous breath sounds auscultated today.  Chest x-ray showing hyperinflated lungs likely consistent with undiagnosed COPD given history of significant tobacco use (although unable to quantify amount and duration). -DuoNebs twice daily  Polysubstance use Patient with history of polysubstance use including significant tobacco use and intermittent cocaine use (uses cocaine 1 time monthly).  Would benefit from substance use counseling especially in regards to stopping cocaine use in the setting of his heart failure.  Prior to Admission Living Arrangement: Home Anticipated Discharge Location: TBD Barriers to Discharge: continued medical management Dispo: Anticipated discharge in approximately 2-3 day(s).   Virl Axe, MD 12/01/2020, 6:17 AM Pager: [phone] After 5pm on weekdays and 1pm on weekends: On Call pager [phone]

## 2020-12-01 NOTE — TOC Progression Note (Signed)
Transition of Care (TOC) - Progression Note  Heart Failure   Patient Details  Name: Edward Mullen MRN: [id number] Date of Birth: 1955-11-20  Transition of Care [facility]) CM/SW [place], [place] Phone Number: 12/01/2020, 3:38 PM  Clinical Narrative:    CSW spoke with that patient at bedside and he stated that he heard from the housing manager Mrs. Dukes [phone] that he owes $3500 by 12/11/2020 and if he pays that amount by that date they will allow him to continue living there. However, if not he will be evicted. Patient reports that he does live in subsidized/section 8 housing. CSW informed the patient that if he gets evicted from section 8 housing he can never receive section 8 or any subsidized housing ever again and Mr. [name] reported understanding. Mr. [name] indicated that he receives $930 each month in disability but that he won't receive that until 12/12/2020. CSW discussed with the patient about his support system and Mr. [name] said that he has a daughter that lives in [place] and that he could get support from her, worst case scenario but that he would prefer to live in his own place. Mr. [name] indicated that he would be open to going to substance use treatment or a recovery program and stated that he is ready to make some changes in his life. CSW provided the patient with Dr. Gilberto Better telephone number with [employer]'s eviction program and to reach out to her for support.   CSW spoke with the [facility] outpatient clinic to see if they can help provide support in anyway and they indicated they could help some but cannot afford to pay the entire $3500 at this time and that Mr. [name] would need to come up with the majority of the money. CSW informed Mr. [name] of this who reported understanding and stated that he will see what he can do and try and ask his daughter and other family and friends for support as well.   CSW will follow up with Mr. [name] on Monday,  12/04/20.  TOC will continue to follow for d/c needs.      Barriers to Discharge: Continued Medical Work up  Expected Discharge Plan and Services   In-house Referral: Clinical Social Work     Living arrangements for the past 2 months: Apartment                                       Social Determinants of Health (SDOH) Interventions Food Insecurity Interventions: Intervention Not Indicated,Other (Comment) (Patient reports that he gets $29 in food stamps for the month which doesn't last him very long.) Financial Strain Interventions: Other (Comment) (Will look into resources and connect with HF outpatient CSW) Housing Interventions: Other (Comment) (Patient reports being $3000 behind on his rent) Transportation Interventions: Brewing technologist (Comment) (patient reports he has bus passes but [facility] would help him)  Readmission Risk Interventions Readmission Risk Prevention Plan 06/10/2019  Transportation Screening Complete  PCP or Specialist Appt within 5-7 Days Complete  Home Care Screening Complete  Medication Review (RN CM) Complete  Some recent data might be hidden   Nayra Coury, MSW, LCSWA [phone] Heart Failure Social Worker

## 2020-12-01 NOTE — Progress Notes (Signed)
Patient is experiencing "hot flash" and sweating afebrile low blood pressure adjust the room temp and gave cool cloth paged MD to make aware. Amio Drip Stopped until further instructed.

## 2020-12-02 ENCOUNTER — Inpatient Hospital Stay (HOSPITAL_COMMUNITY): Payer: Medicare (Managed Care)

## 2020-12-02 DIAGNOSIS — F1990 Other psychoactive substance use, unspecified, uncomplicated: Secondary | ICD-10-CM

## 2020-12-02 DIAGNOSIS — N5089 Other specified disorders of the male genital organs: Secondary | ICD-10-CM

## 2020-12-02 DIAGNOSIS — R338 Other retention of urine: Secondary | ICD-10-CM | POA: Diagnosis not present

## 2020-12-02 DIAGNOSIS — I472 Ventricular tachycardia: Secondary | ICD-10-CM | POA: Diagnosis not present

## 2020-12-02 DIAGNOSIS — I5043 Acute on chronic combined systolic (congestive) and diastolic (congestive) heart failure: Secondary | ICD-10-CM | POA: Diagnosis not present

## 2020-12-02 LAB — BASIC METABOLIC PANEL
Anion gap: 8 (ref 5–15)
BUN: 27 mg/dL — ABNORMAL HIGH (ref 8–23)
CO2: 28 mmol/L (ref 22–32)
Calcium: 8.5 mg/dL — ABNORMAL LOW (ref 8.9–10.3)
Chloride: 102 mmol/L (ref 98–111)
Creatinine, Ser: 1.7 mg/dL — ABNORMAL HIGH (ref 0.61–1.24)
GFR, Estimated: 44 mL/min — ABNORMAL LOW (ref >60)
Glucose, Bld: 107 mg/dL — ABNORMAL HIGH (ref 70–99)
Potassium: 4.9 mmol/L (ref 3.5–5.1)
Sodium: 138 mmol/L (ref 135–145)

## 2020-12-02 LAB — MAGNESIUM: Magnesium: 2 mg/dL (ref 1.7–2.4)

## 2020-12-02 MED ORDER — TAMSULOSIN HCL 0.4 MG PO CAPS
0.4000 mg | ORAL_CAPSULE | Freq: Every day | ORAL | Status: DC
  Administered 2020-12-03 – 2020-12-04 (×2): 0.4 mg via ORAL
  Filled 2020-12-02 (×3): qty 1

## 2020-12-02 MED ORDER — ALBUTEROL SULFATE HFA 108 (90 BASE) MCG/ACT IN AERS
1.0000 | INHALATION_SPRAY | Freq: Four times a day (QID) | RESPIRATORY_TRACT | Status: DC | PRN
  Filled 2020-12-02: qty 6.7

## 2020-12-02 MED ORDER — FUROSEMIDE 10 MG/ML IJ SOLN
60.0000 mg | Freq: Two times a day (BID) | INTRAMUSCULAR | Status: DC
  Administered 2020-12-02 – 2020-12-03 (×2): 60 mg via INTRAVENOUS
  Filled 2020-12-02 (×2): qty 6

## 2020-12-02 NOTE — Progress Notes (Signed)
Patient ID: Edward Mullen, male   DOB: Jun 14, 1956, 65 y.o.   MRN: [id number]     Advanced Heart Failure Rounding Note  PCP-Cardiologist: No primary care provider on file.   Subjective:    Weight down 2 more lbs.  Still says he is short of breath moving around the room.  Creatinine 1.5>1.7>1.7.  SBP 90s.     Objective:   Weight Range: 80.8 kg Body mass index is 24.15 kg/m.   Vital Signs:   Temp:  [97.3 F (36.3 C)-97.9 F (36.6 C)] 97.6 F (36.4 C) (04/23 0439) Pulse Rate:  [71-73] 71 (04/23 0439) Resp:  [18-20] 19 (04/23 0439) BP: (91-101)/(65-77) 99/77 (04/23 0439) SpO2:  [95 %-97 %] 97 % (04/23 0439) Weight:  [80.8 kg] 80.8 kg (04/23 0439) Last BM Date: 11/30/20  Weight change: Filed Weights   11/30/20 0510 12/01/20 0500 12/02/20 0439  Weight: 83.7 kg 81.8 kg 80.8 kg    Intake/Output:   Intake/Output Summary (Last 24 hours) at 12/02/2020 1058 Last data filed at 12/02/2020 1000 Gross per 24 hour  Intake 720 ml  Output 900 ml  Net -180 ml      Physical Exam    General: NAD Neck: JVP 12 cm, no thyromegaly or thyroid nodule.  Lungs: Clear to auscultation bilaterally with normal respiratory effort. CV: Lateral PMI.  Heart regular S1/S2, no S3/S4, no murmur.  1+ edema 1/3 to knees bilaterally.    Abdomen: Soft, nontender, no hepatosplenomegaly, no distention.  Skin: Intact without lesions or rashes.  Neurologic: Alert and oriented x 3.  Psych: Normal affect. Extremities: No clubbing or cyanosis.  HEENT: Normal.    Telemetry   SR with PVCs (personally reviewed)  EKG    N/A  Labs    CBC No results for input(s): WBC, NEUTROABS, HGB, HCT, MCV, PLT in the last 72 hours. Basic Metabolic Panel Recent Labs    12/01/20 0445 12/02/20 0401  NA 140 138  K 4.7 4.9  CL 99 102  CO2 32 28  GLUCOSE 97 107*  BUN 20 27*  CREATININE 1.72* 1.70*  CALCIUM 8.9 8.5*  MG 2.2 2.0   Liver Function Tests No results for input(s): AST, ALT, ALKPHOS, BILITOT,  PROT, ALBUMIN in the last 72 hours. No results for input(s): LIPASE, AMYLASE in the last 72 hours. Cardiac Enzymes No results for input(s): CKTOTAL, CKMB, CKMBINDEX, TROPONINI in the last 72 hours.  BNP: BNP (last 3 results) Recent Labs    07/27/20 0522 08/01/20 1046 11/28/20 1042  BNP 1,901.4* 447.6* 2,565.3*    ProBNP (last 3 results) No results for input(s): PROBNP in the last 8760 hours.   D-Dimer No results for input(s): DDIMER in the last 72 hours. Hemoglobin A1C No results for input(s): HGBA1C in the last 72 hours. Fasting Lipid Panel No results for input(s): CHOL, HDL, LDLCALC, TRIG, CHOLHDL, LDLDIRECT in the last 72 hours. Thyroid Function Tests No results for input(s): TSH, T4TOTAL, T3FREE, THYROIDAB in the last 72 hours.  Invalid input(s): FREET3  Other results:   Imaging    No results found.   Medications:     Scheduled Medications: . amiodarone  200 mg Oral BID  . Chlorhexidine Gluconate Cloth  6 each Topical Daily  . digoxin  0.125 mg Oral Daily  . enoxaparin (LOVENOX) injection  40 mg Subcutaneous Q24H  . eplerenone  25 mg Oral Daily  . furosemide  60 mg Intravenous BID  . polyethylene glycol  17 g Oral Daily  . rosuvastatin  5 mg Oral Daily  . sodium chloride flush  3 mL Intravenous Q12H  . sodium chloride flush  3 mL Intravenous Q12H  . traZODone  50 mg Oral QHS    Infusions: . sodium chloride      PRN Medications: sodium chloride, acetaminophen, ondansetron (ZOFRAN) IV, sodium chloride flush     Assessment/Plan     1. Acute on chronic systolic CHF: EF 40-34% with moderate to severe MR on echo 1/18. Nonischemic cardiomyopathy. HTN vs cocaine use vs viral vs ETOH. HIV negative, SPEP with no M-spike. Patient did have CAD but not enough to explain cardiomyopathy on 2018 cath. Echo in 8/18 showed EF up to 50%.  Patient was lost to followup in HF clinic, now back with CHF and echo showing EF 20-25%, moderate LVH, normal RV.  He has  been off his meds, back on cocaine.  Still has wide LBBB.  Drop in EF may be due to recurrent cocaine abuse, medication noncompliance, and LBBB (nonischemic cardiomyopathy).  However, he also has frequent NSVT runs and had CAD on prior cath.  Cannot rule out worsening CAD.  He was transitioned to po Lasix but this morning, reports dyspnea and looks volume overloaded on exam.  SBP 90s.    - Continue eplerenone 25 mg daily.     - Continue digoxin 0.125 daily.  - BP too low for Entresto right now.  - Will restart IV Lasix, 60 mg IV bid for now.   - Needs to cut out cocaine totally.  - RHC/LHC probably Monday when creatinine is stabilized.  - Not going to be an ICD candidate until he is drug-free.   2. HTN: BP now running low.     3. Cocaine abuse:  He has restarted cocaine use.  I encouraged him to stay off cocaine.    4. NSVT/PVCs:  Frequent.   - Continue amiodarone 200 mg bid to suppress.  5. CKD: Stage 3.  Follow creatinine closely.   6. CAD: 80% stenosis PLV on cath in 2018.  No ischemic chest pain, but EF has dropped again and he has had frequent NSVT.   - Continue ASA 81 daily and Crestor 5 mg daily.   - LHC/RHC as above on Monday.   Loralie Champagne 12/02/2020 10:58 AM

## 2020-12-02 NOTE — Progress Notes (Signed)
Patient has made improvement with blood-tinged urine after receiving Flomax and IV lasix. Measured out 1000 cc of straw-colored urine  in less than 1 hour.

## 2020-12-02 NOTE — Progress Notes (Signed)
Subjective:   No acute overnight events.  This morning, [name] states he continues to have some shortness of breath that seems unchanged from prior days.  He has some shortness of breath with laying down and with exertion.  We discussed his Foley catheter and consideration of starting medication in hopes to have the catheter removed.  [name] is in agreement with the plan.  He did note an area on his scrotum that he wanted [place] to examine.  He states that there is a nodule there that has been present for several months.  It is nontender.  Objective:  Vital signs in last 24 hours: Vitals:   12/01/20 1045 12/01/20 1146 12/01/20 1941 12/02/20 0439  BP: (!) 89/70 101/77 91/65 99/77   Pulse: 75 73 71 71  Resp: 20 18 20 19   Temp: (!) 97.5 F (36.4 C) (!) 97.3 F (36.3 C) 97.9 F (36.6 C) 97.6 F (36.4 C)  TempSrc: Oral Oral Oral Oral  SpO2: 96% 95% 95% 97%  Weight:    80.8 kg  Height:       General: Elderly male, NAD. Cardiovascular: Normal rate and regular rhythm, no murmurs rubs or gallops. Pulmonary: Clear to auscultation bilaterally, respiratory effort is unlabored. MSK: Trace bilateral lower extremity pitting edema. GU: 0.5 x 2 cm soft, nontender mass in the left scrotum that is mobile. Neuro: AAOx3, no focal deficits noted.  Assessment/Plan:  Active Problems:   Acute HFrEF (heart failure with reduced ejection fraction) ([place])   Urinary retention  [name] is a 65 year old gentleman with a past medical history of HFrEF, polysubstance abuse who is currently admitted for acute exacerbation of chronic HFrEF in the setting of cocaine use and difficulty obtaining medication.  Acute on chronic combined systolic and diastolic heart failure Overall, patient's volume status continues to improve compared to admission.  -Cardiology following; appreciate their recommendations  -Maintaining K> 4.0 and Mg> 2.0 in the setting of diuresis, replete as needed -Daily BMP  and magnesium -Strict I/O's, daily standing weights -Lasix 60 mg IV BID   -Digoxin started on 4/21 -Eplerenone started on 4/22 -L/R cath planned on Monday   Acute on CKD IIIa Baseline creatinine appears to be around 1.4 on chart review. Baseline GFR appears to be around 47. On admission, Cr 1.61.  Coud catheter in place with good urine output.  Creatinine stable today.   -Avoid nephrotoxic medications -daily BMP  NSVT Patient with history of asymptomatic NSVT in the setting of HFrEF.  Episodes of NSVT have decreased in frequency since initiating amiodarone.  - Amiodarone transitioned to oral today  Undiagnosed COPD Chest x-ray showing hyperinflated lungs likely consistent with undiagnosed COPD given history of significant tobacco use (although unable to quantify amount and duration). No wheezing on examination today.   -Albuterol PRN  BPH PSA low at 2.7.   -Coude cath in place -Trial Flomax 0.4 mg with dinner daily. Will to remove catheter and resume bladder scan q6h. If volume > 250 post-void, may need catheter re-inserted.   Polysubstance use Patient with history of polysubstance use including significant tobacco use and intermittent cocaine use (uses cocaine 1 time monthly).  Would benefit from substance use counseling especially in regards to stopping cocaine use in the setting of his heart failure.  Scrotal Mass Likely varicocele.   -Scrotal ultrasound pending   Prior to Admission Living Arrangement: Home Anticipated Discharge Location: TBD Barriers to Discharge: continued medical management Dispo: Anticipated discharge in approximately 2-3 day(s).  Dr. Jose Persia Internal Medicine PGY-2  Pager: [phone] After 5pm on weekdays and 1pm on weekends: On Call pager [phone]  12/02/2020, 6:39 AM

## 2020-12-02 NOTE — Progress Notes (Signed)
Patient continues to have a urine leaking around foley paged  MD team aware waiting on results from scrotal ultrasound.

## 2020-12-03 LAB — BASIC METABOLIC PANEL
Anion gap: 7 (ref 5–15)
BUN: 25 mg/dL — ABNORMAL HIGH (ref 8–23)
CO2: 31 mmol/L (ref 22–32)
Calcium: 8.7 mg/dL — ABNORMAL LOW (ref 8.9–10.3)
Chloride: 99 mmol/L (ref 98–111)
Creatinine, Ser: 1.48 mg/dL — ABNORMAL HIGH (ref 0.61–1.24)
GFR, Estimated: 52 mL/min — ABNORMAL LOW (ref >60)
Glucose, Bld: 97 mg/dL (ref 70–99)
Potassium: 4.2 mmol/L (ref 3.5–5.1)
Sodium: 137 mmol/L (ref 135–145)

## 2020-12-03 LAB — MAGNESIUM: Magnesium: 2.1 mg/dL (ref 1.7–2.4)

## 2020-12-03 MED ORDER — TORSEMIDE 20 MG PO TABS
20.0000 mg | ORAL_TABLET | Freq: Every day | ORAL | Status: DC
  Filled 2020-12-03: qty 1

## 2020-12-03 NOTE — Progress Notes (Signed)
Patient ID: Edward Mullen, male   DOB: March 05, 1956, 65 y.o.   MRN: [id number]     Advanced Heart Failure Rounding Note  PCP-Cardiologist: No primary care provider on file.   Subjective:    Weight down 5 more lbs, good diuresis yesterday.  Still says he feels short of breath at times but mostly ok.  Creatinine 1.5>1.7>1.7>1.48.  SBP 90s-100s.   Has had additional NSVT runs.     Objective:   Weight Range: 78.9 kg Body mass index is 23.59 kg/m.   Vital Signs:   Temp:  [97.4 F (36.3 C)-98.1 F (36.7 C)] 97.8 F (36.6 C) (04/24 0810) Pulse Rate:  [65-81] 78 (04/24 0810) Resp:  [18-20] 20 (04/24 0810) BP: (94-102)/(70-88) 100/73 (04/24 0810) SpO2:  [95 %-99 %] 98 % (04/24 0810) Weight:  [78.9 kg] 78.9 kg (04/24 0444) Last BM Date: 12/02/20  Weight change: Filed Weights   12/01/20 0500 12/02/20 0439 12/03/20 0444  Weight: 81.8 kg 80.8 kg 78.9 kg    Intake/Output:   Intake/Output Summary (Last 24 hours) at 12/03/2020 1004 Last data filed at 12/03/2020 0451 Gross per 24 hour  Intake 1320 ml  Output 5150 ml  Net -3830 ml      Physical Exam    General: NAD Neck: No JVD, no thyromegaly or thyroid nodule.  Lungs: Clear to auscultation bilaterally with normal respiratory effort. CV: Lateral PMI.  Heart regular S1/S2, no S3/S4, no murmur.  No peripheral edema.   Abdomen: Soft, nontender, no hepatosplenomegaly, no distention.  Skin: Intact without lesions or rashes.  Neurologic: Alert and oriented x 3.  Psych: Normal affect. Extremities: No clubbing or cyanosis.  HEENT: Normal.    Telemetry   SR with 2-3 runs NSVT (personally reviewed)  EKG    N/A  Labs    CBC No results for input(s): WBC, NEUTROABS, HGB, HCT, MCV, PLT in the last 72 hours. Basic Metabolic Panel Recent Labs    12/02/20 0401 12/03/20 0210  NA 138 137  K 4.9 4.2  CL 102 99  CO2 28 31  GLUCOSE 107* 97  BUN 27* 25*  CREATININE 1.70* 1.48*  CALCIUM 8.5* 8.7*  MG 2.0 2.1   Liver  Function Tests No results for input(s): AST, ALT, ALKPHOS, BILITOT, PROT, ALBUMIN in the last 72 hours. No results for input(s): LIPASE, AMYLASE in the last 72 hours. Cardiac Enzymes No results for input(s): CKTOTAL, CKMB, CKMBINDEX, TROPONINI in the last 72 hours.  BNP: BNP (last 3 results) Recent Labs    07/27/20 0522 08/01/20 1046 11/28/20 1042  BNP 1,901.4* 447.6* 2,565.3*    ProBNP (last 3 results) No results for input(s): PROBNP in the last 8760 hours.   D-Dimer No results for input(s): DDIMER in the last 72 hours. Hemoglobin A1C No results for input(s): HGBA1C in the last 72 hours. Fasting Lipid Panel No results for input(s): CHOL, HDL, LDLCALC, TRIG, CHOLHDL, LDLDIRECT in the last 72 hours. Thyroid Function Tests No results for input(s): TSH, T4TOTAL, T3FREE, THYROIDAB in the last 72 hours.  Invalid input(s): FREET3  Other results:   Imaging    US SCROTUM  Result Date: 12/02/2020 CLINICAL DATA:  Scrotal mass EXAM: ULTRASOUND OF SCROTUM TECHNIQUE: Complete ultrasound examination of the testicles, epididymis, and other scrotal structures was performed. COMPARISON:  Scrotal ultrasound dated 06/04/2019. FINDINGS: Right testicle Measurements: 4.0 x 2.5 x 2.8 cm. Color Doppler flow is present. No mass or microlithiasis visualized. Left testicle Measurements: 3.8 x 2.4 x 2.6 cm. Color Doppler flow  is present. No mass or microlithiasis visualized. Right epididymis:  An epididymal cyst measures 0.5 x 0.4 x 0.5 cm. Left epididymis:  Normal in size and appearance. Hydrocele:  A small right hydrocele is noted. Varicocele:  None visualized. Other: In the left inguinal canal a simple cyst measures 2.4 x 2.2 x 1.9 cm and is not significantly changed since 06/04/2019. No color flow is seen within the cyst. IMPRESSION: 1. No evidence of testicular mass. 2. Simple cyst in the left inguinal canal is not significantly changed since 06/04/2019. Differential considerations include a  spermatic cord hydrocele and lymphocele. 3. Small right hydrocele. Electronically Signed   By: Zerita Boers M.D.   On: 12/02/2020 12:01     Medications:     Scheduled Medications: . amiodarone  200 mg Oral BID  . Chlorhexidine Gluconate Cloth  6 each Topical Daily  . digoxin  0.125 mg Oral Daily  . enoxaparin (LOVENOX) injection  40 mg Subcutaneous Q24H  . eplerenone  25 mg Oral Daily  . polyethylene glycol  17 g Oral Daily  . rosuvastatin  5 mg Oral Daily  . sodium chloride flush  3 mL Intravenous Q12H  . sodium chloride flush  3 mL Intravenous Q12H  . tamsulosin  0.4 mg Oral QPC supper  . [START ON 12/04/2020] torsemide  20 mg Oral Daily  . traZODone  50 mg Oral QHS    Infusions: . sodium chloride      PRN Medications: sodium chloride, acetaminophen, albuterol, ondansetron (ZOFRAN) IV, sodium chloride flush     Assessment/Plan     1. Acute on chronic systolic CHF: EF 81-82% with moderate to severe MR on echo 1/18. Nonischemic cardiomyopathy. HTN vs cocaine use vs viral vs ETOH. HIV negative, SPEP with no M-spike. Patient did have CAD but not enough to explain cardiomyopathy on 2018 cath. Echo in 8/18 showed EF up to 50%.  Patient was lost to followup in HF clinic, now back with CHF and echo showing EF 20-25%, moderate LVH, normal RV.  He has been off his meds, back on cocaine.  Still has wide LBBB.  Drop in EF may be due to recurrent cocaine abuse, medication noncompliance, and LBBB (nonischemic cardiomyopathy).  However, he also has frequent NSVT runs and had CAD on prior cath.  Cannot rule out worsening CAD.  Good diuresis with IV Lasix yesterday, weight down.  He does not look volume overloaded but still reports some dyspnea.  SBP 90s-100s.    - Continue eplerenone 25 mg daily.     - Continue digoxin 0.125 daily.  - BP too low for ARNI/ARB/beta blocker right now.  - Stop IV lasix (got dose this morning), start torsemide 20 mg daily tomorrow.    - Needs to cut out  cocaine totally.  - RHC/LHC probably Monday when creatinine is stabilized.  - Not going to be an ICD candidate until he is drug-free.   2. HTN: BP now running low.     3. Cocaine abuse:  He has restarted cocaine use.  I encouraged him to stay off cocaine.    4. NSVT/PVCs:  Frequent.   - Continue amiodarone 200 mg bid to suppress.  5. AKI on CKD Stage 3: Creatinine lower today at 1.48.   6. CAD: 80% stenosis PLV on cath in 2018.  No ischemic chest pain, but EF has dropped again and he has had frequent NSVT.   - Continue ASA 81 daily and Crestor 5 mg daily.   - LHC/RHC  as above on Monday. Discussed risks/benefits with patient and he agrees to procedure.   Loralie Champagne 12/03/2020 10:04 AM

## 2020-12-03 NOTE — Progress Notes (Signed)
   Subjective:   6 beat run of NSVT, but patient was asymptomatic.  Reports doing well, states he has been peeing a lot.  Asking when the Foley catheter can be removed.  Discussed that there is the possibility of it being removed tomorrow.  Objective:  Vital signs in last 24 hours: Vitals:   12/02/20 0439 12/02/20 1104 12/02/20 1921 12/03/20 0444  BP: 99/77 102/88 98/70 94/73   Pulse: 71 81 67 65  Resp: 19 18 18 18   Temp: 97.6 F (36.4 C) 98.1 F (36.7 C) 98.1 F (36.7 C) (!) 97.4 F (36.3 C)  TempSrc: Oral  Oral Oral  SpO2: 97% 95% 99% 97%  Weight: 80.8 kg   78.9 kg  Height:       General: Elderly male sitting up in chair, NAD. Cardiovascular: Normal rate and regular rhythm, no murmurs rubs or gallops. Pulmonary: Clear to auscultation bilaterally, unlabored respiratory effort. MSK: Trace bilateral lower extremity pitting edema. Neuro: AAOx3, no focal deficits noted.  Assessment/Plan:  Active Problems:   Acute HFrEF (heart failure with reduced ejection fraction) ([place])   Urinary retention  [name] is a 65 year old gentleman with a past medical history of HFrEF, polysubstance abuse who is currently admitted for acute exacerbation of chronic HFrEF in the setting of cocaine use and difficulty obtaining medication.  Acute on chronic combined systolic and diastolic heart failure Overall, patient's volume status continues to improve compared to admission.  Since admission, -12.9 L.  -Cardiology following; appreciate their recommendations  -Maintaining K> 4.0 and Mg> 2.0 in the setting of diuresis, replete as needed -Daily BMP and magnesium -Strict I/O's, daily standing weights -Stopping IV Lasix, will start torsemide 20 mg daily from tomorrow per cardiology -Digoxin started on 4/21 -Eplerenone started on 4/22 -L/R cath planned on Monday   Acute on CKD IIIa Baseline creatinine appears to be around 1.4 on chart review. Baseline GFR appears to be around 47. On  admission, Cr 1.61.  Coud catheter in place with good urine output.  Creatinine improved today.  -Avoid nephrotoxic medications -daily BMP  NSVT Patient with history of asymptomatic NSVT in the setting of HFrEF.   -Continue amiodarone 200 mg twice daily per cardiology  Undiagnosed COPD Chest x-ray showing hyperinflated lungs likely consistent with undiagnosed COPD given history of significant tobacco use (although unable to quantify amount and duration). No wheezing on examination today.   -Albuterol PRN  BPH PSA low at 2.7.   -Coude cath in place -Trial Flomax 0.4 mg with dinner daily. Will attempt to remove catheter tomorrow and resume bladder scan q6h. If volume >250 post-void, may need catheter re-inserted.   Polysubstance use Patient with history of polysubstance use including significant tobacco use and intermittent cocaine use (uses cocaine 1 time monthly).  Would benefit from substance use counseling especially in regards to stopping cocaine use in the setting of his heart failure.  Scrotal Mass Scrotal ultrasound showing small right hydrocele and unchanged simple cyst in left inguinal canal. No tenderness associated with scrotal mass.  This can be followed up as outpatient.  Prior to Admission Living Arrangement: Home Anticipated Discharge Location: TBD Barriers to Discharge: continued medical management Dispo: Anticipated discharge in approximately 2-3 day(s).   Dr. Virl Axe Internal Medicine PGY-1 Pager: [phone] After 5pm on weekdays and 1pm on weekends: On Call pager [phone]  12/03/2020, 6:10 AM

## 2020-12-03 NOTE — Progress Notes (Signed)
Pt had another runs of V-Tach. This time, it's 15-beat runs. Pt denies CP, palpitations, SOB at this time. MD paged via [place]. Pt's potassium and mag level this morning are WNL. Will continue monitoring pt closely.

## 2020-12-03 NOTE — Progress Notes (Signed)
Pt has 6-beats run of V-tach. Denies chest pain, palpitations, or SOB. MD on call has been notified via page. Waiting for response.

## 2020-12-03 NOTE — Progress Notes (Signed)
MD called back, and no new orders were placed. Magnesium level previously ordered. Lab not available for review as the blood work will be done soon.

## 2020-12-03 NOTE — Progress Notes (Signed)
Telemetry called stating pt had a 5 run of vtach. Has had previously. Not symptomatic. Paged MD. No further orders. Will continue to monitor.   Jackalyn Lombard

## 2020-12-03 NOTE — H&P (View-Only) (Signed)
Patient ID: Edward Mullen, male   DOB: 09/29/1955, 65 y.o.   MRN: [id number]     Advanced Heart Failure Rounding Note  PCP-Cardiologist: No primary care provider on file.   Subjective:    Weight down 5 more lbs, good diuresis yesterday.  Still says he feels short of breath at times but mostly ok.  Creatinine 1.5>1.7>1.7>1.48.  SBP 90s-100s.   Has had additional NSVT runs.     Objective:   Weight Range: 78.9 kg Body mass index is 23.59 kg/m.   Vital Signs:   Temp:  [97.4 F (36.3 C)-98.1 F (36.7 C)] 97.8 F (36.6 C) (04/24 0810) Pulse Rate:  [65-81] 78 (04/24 0810) Resp:  [18-20] 20 (04/24 0810) BP: (94-102)/(70-88) 100/73 (04/24 0810) SpO2:  [95 %-99 %] 98 % (04/24 0810) Weight:  [78.9 kg] 78.9 kg (04/24 0444) Last BM Date: 12/02/20  Weight change: Filed Weights   12/01/20 0500 12/02/20 0439 12/03/20 0444  Weight: 81.8 kg 80.8 kg 78.9 kg    Intake/Output:   Intake/Output Summary (Last 24 hours) at 12/03/2020 1004 Last data filed at 12/03/2020 0451 Gross per 24 hour  Intake 1320 ml  Output 5150 ml  Net -3830 ml      Physical Exam    General: NAD Neck: No JVD, no thyromegaly or thyroid nodule.  Lungs: Clear to auscultation bilaterally with normal respiratory effort. CV: Lateral PMI.  Heart regular S1/S2, no S3/S4, no murmur.  No peripheral edema.   Abdomen: Soft, nontender, no hepatosplenomegaly, no distention.  Skin: Intact without lesions or rashes.  Neurologic: Alert and oriented x 3.  Psych: Normal affect. Extremities: No clubbing or cyanosis.  HEENT: Normal.    Telemetry   SR with 2-3 runs NSVT (personally reviewed)  EKG    N/A  Labs    CBC No results for input(s): WBC, NEUTROABS, HGB, HCT, MCV, PLT in the last 72 hours. Basic Metabolic Panel Recent Labs    12/02/20 0401 12/03/20 0210  NA 138 137  K 4.9 4.2  CL 102 99  CO2 28 31  GLUCOSE 107* 97  BUN 27* 25*  CREATININE 1.70* 1.48*  CALCIUM 8.5* 8.7*  MG 2.0 2.1   Liver  Function Tests No results for input(s): AST, ALT, ALKPHOS, BILITOT, PROT, ALBUMIN in the last 72 hours. No results for input(s): LIPASE, AMYLASE in the last 72 hours. Cardiac Enzymes No results for input(s): CKTOTAL, CKMB, CKMBINDEX, TROPONINI in the last 72 hours.  BNP: BNP (last 3 results) Recent Labs    07/27/20 0522 08/01/20 1046 11/28/20 1042  BNP 1,901.4* 447.6* 2,565.3*    ProBNP (last 3 results) No results for input(s): PROBNP in the last 8760 hours.   D-Dimer No results for input(s): DDIMER in the last 72 hours. Hemoglobin A1C No results for input(s): HGBA1C in the last 72 hours. Fasting Lipid Panel No results for input(s): CHOL, HDL, LDLCALC, TRIG, CHOLHDL, LDLDIRECT in the last 72 hours. Thyroid Function Tests No results for input(s): TSH, T4TOTAL, T3FREE, THYROIDAB in the last 72 hours.  Invalid input(s): FREET3  Other results:   Imaging    US SCROTUM  Result Date: 12/02/2020 CLINICAL DATA:  Scrotal mass EXAM: ULTRASOUND OF SCROTUM TECHNIQUE: Complete ultrasound examination of the testicles, epididymis, and other scrotal structures was performed. COMPARISON:  Scrotal ultrasound dated 06/04/2019. FINDINGS: Right testicle Measurements: 4.0 x 2.5 x 2.8 cm. Color Doppler flow is present. No mass or microlithiasis visualized. Left testicle Measurements: 3.8 x 2.4 x 2.6 cm. Color Doppler flow   is present. No mass or microlithiasis visualized. Right epididymis:  An epididymal cyst measures 0.5 x 0.4 x 0.5 cm. Left epididymis:  Normal in size and appearance. Hydrocele:  A small right hydrocele is noted. Varicocele:  None visualized. Other: In the left inguinal canal a simple cyst measures 2.4 x 2.2 x 1.9 cm and is not significantly changed since 06/04/2019. No color flow is seen within the cyst. IMPRESSION: 1. No evidence of testicular mass. 2. Simple cyst in the left inguinal canal is not significantly changed since 06/04/2019. Differential considerations include a  spermatic cord hydrocele and lymphocele. 3. Small right hydrocele. Electronically Signed   By: Tyler  Litton M.D.   On: 12/02/2020 12:01     Medications:     Scheduled Medications: . amiodarone  200 mg Oral BID  . Chlorhexidine Gluconate Cloth  6 each Topical Daily  . digoxin  0.125 mg Oral Daily  . enoxaparin (LOVENOX) injection  40 mg Subcutaneous Q24H  . eplerenone  25 mg Oral Daily  . polyethylene glycol  17 g Oral Daily  . rosuvastatin  5 mg Oral Daily  . sodium chloride flush  3 mL Intravenous Q12H  . sodium chloride flush  3 mL Intravenous Q12H  . tamsulosin  0.4 mg Oral QPC supper  . [START ON 12/04/2020] torsemide  20 mg Oral Daily  . traZODone  50 mg Oral QHS    Infusions: . sodium chloride      PRN Medications: sodium chloride, acetaminophen, albuterol, ondansetron (ZOFRAN) IV, sodium chloride flush     Assessment/Plan     1. Acute on chronic systolic CHF: EF 15-20% with moderate to severe MR on echo 1/18. Nonischemic cardiomyopathy. HTN vs cocaine use vs viral vs ETOH. HIV negative, SPEP with no M-spike. Patient did have CAD but not enough to explain cardiomyopathy on 2018 cath. Echo in 8/18 showed EF up to 50%.  Patient was lost to followup in HF clinic, now back with CHF and echo showing EF 20-25%, moderate LVH, normal RV.  He has been off his meds, back on cocaine.  Still has wide LBBB.  Drop in EF may be due to recurrent cocaine abuse, medication noncompliance, and LBBB (nonischemic cardiomyopathy).  However, he also has frequent NSVT runs and had CAD on prior cath.  Cannot rule out worsening CAD.  Good diuresis with IV Lasix yesterday, weight down.  He does not look volume overloaded but still reports some dyspnea.  SBP 90s-100s.    - Continue eplerenone 25 mg daily.     - Continue digoxin 0.125 daily.  - BP too low for ARNI/ARB/beta blocker right now.  - Stop IV lasix (got dose this morning), start torsemide 20 mg daily tomorrow.    - Needs to cut out  cocaine totally.  - RHC/LHC probably Monday when creatinine is stabilized.  - Not going to be an ICD candidate until he is drug-free.   2. HTN: BP now running low.     3. Cocaine abuse:  He has restarted cocaine use.  I encouraged him to stay off cocaine.    4. NSVT/PVCs:  Frequent.   - Continue amiodarone 200 mg bid to suppress.  5. AKI on CKD Stage 3: Creatinine lower today at 1.48.   6. CAD: 80% stenosis PLV on cath in 2018.  No ischemic chest pain, but EF has dropped again and he has had frequent NSVT.   - Continue ASA 81 daily and Crestor 5 mg daily.   - LHC/RHC   as above on Monday. Discussed risks/benefits with patient and he agrees to procedure.   Loralie Champagne 12/03/2020 10:04 AM

## 2020-12-04 ENCOUNTER — Encounter (HOSPITAL_COMMUNITY)
Admission: EM | Disposition: A | Discharge status: Home / Self Care | Payer: Self-pay | Source: Home / Self Care | Attending: Internal Medicine

## 2020-12-04 ENCOUNTER — Encounter (HOSPITAL_COMMUNITY): Payer: Self-pay | Admitting: Cardiology

## 2020-12-04 DIAGNOSIS — N1831 Chronic kidney disease, stage 3a: Secondary | ICD-10-CM | POA: Diagnosis not present

## 2020-12-04 DIAGNOSIS — I5023 Acute on chronic systolic (congestive) heart failure: Secondary | ICD-10-CM

## 2020-12-04 DIAGNOSIS — I10 Essential (primary) hypertension: Secondary | ICD-10-CM

## 2020-12-04 DIAGNOSIS — I2510 Atherosclerotic heart disease of native coronary artery without angina pectoris: Secondary | ICD-10-CM

## 2020-12-04 DIAGNOSIS — J449 Chronic obstructive pulmonary disease, unspecified: Secondary | ICD-10-CM | POA: Diagnosis not present

## 2020-12-04 DIAGNOSIS — R338 Other retention of urine: Secondary | ICD-10-CM

## 2020-12-04 DIAGNOSIS — I472 Ventricular tachycardia: Secondary | ICD-10-CM | POA: Diagnosis not present

## 2020-12-04 DIAGNOSIS — I5043 Acute on chronic combined systolic (congestive) and diastolic (congestive) heart failure: Secondary | ICD-10-CM | POA: Diagnosis not present

## 2020-12-04 DIAGNOSIS — N401 Enlarged prostate with lower urinary tract symptoms: Secondary | ICD-10-CM

## 2020-12-04 HISTORY — PX: RIGHT/LEFT HEART CATH AND CORONARY ANGIOGRAPHY: CATH118266

## 2020-12-04 LAB — POCT I-STAT EG7
Acid-Base Excess: 6 mmol/L — ABNORMAL HIGH (ref 0.0–2.0)
Bicarbonate: 32.1 mmol/L — ABNORMAL HIGH (ref 20.0–28.0)
Calcium, Ion: 1.2 mmol/L (ref 1.15–1.40)
HCT: 46 % (ref 39.0–52.0)
Hemoglobin: 15.6 g/dL (ref 13.0–17.0)
O2 Saturation: 72 %
Potassium: 4.6 mmol/L (ref 3.5–5.1)
Sodium: 139 mmol/L (ref 135–145)
TCO2: 34 mmol/L — ABNORMAL HIGH (ref 22–32)
pCO2, Ven: 51.5 mmHg (ref 44.0–60.0)
pH, Ven: 7.403 (ref 7.250–7.430)
pO2, Ven: 39 mmHg (ref 32.0–45.0)

## 2020-12-04 LAB — HEMOGLOBIN A1C
Hgb A1c MFr Bld: 5.6 % (ref 4.8–5.6)
Mean Plasma Glucose: 114.02 mg/dL

## 2020-12-04 LAB — BASIC METABOLIC PANEL
Anion gap: 7 (ref 5–15)
BUN: 24 mg/dL — ABNORMAL HIGH (ref 8–23)
CO2: 32 mmol/L (ref 22–32)
Calcium: 8.6 mg/dL — ABNORMAL LOW (ref 8.9–10.3)
Chloride: 99 mmol/L (ref 98–111)
Creatinine, Ser: 1.7 mg/dL — ABNORMAL HIGH (ref 0.61–1.24)
GFR, Estimated: 44 mL/min — ABNORMAL LOW (ref >60)
Glucose, Bld: 83 mg/dL (ref 70–99)
Potassium: 4.6 mmol/L (ref 3.5–5.1)
Sodium: 138 mmol/L (ref 135–145)

## 2020-12-04 LAB — TSH: TSH: 2.386 u[IU]/mL (ref 0.350–4.500)

## 2020-12-04 LAB — VITAMIN B12: Vitamin B-12: 993 pg/mL — ABNORMAL HIGH (ref 180–914)

## 2020-12-04 SURGERY — RIGHT/LEFT HEART CATH AND CORONARY ANGIOGRAPHY
Anesthesia: LOCAL

## 2020-12-04 MED ORDER — SODIUM CHLORIDE 0.9% FLUSH
3.0000 mL | Freq: Two times a day (BID) | INTRAVENOUS | Status: DC
  Administered 2020-12-04 – 2020-12-05 (×2): 3 mL via INTRAVENOUS

## 2020-12-04 MED ORDER — SODIUM CHLORIDE 0.9 % IV SOLN: INTRAVENOUS | Status: DC

## 2020-12-04 MED ORDER — ONDANSETRON HCL 4 MG/2ML IJ SOLN: 4.0000 mg | Freq: Four times a day (QID) | INTRAMUSCULAR | Status: DC | PRN

## 2020-12-04 MED ORDER — IOHEXOL 350 MG/ML SOLN
INTRAVENOUS | Status: DC | PRN
  Administered 2020-12-04: 65 mL

## 2020-12-04 MED ORDER — ACETAMINOPHEN 325 MG PO TABS: 650.0000 mg | ORAL_TABLET | ORAL | Status: DC | PRN

## 2020-12-04 MED ORDER — TORSEMIDE 20 MG PO TABS
20.0000 mg | ORAL_TABLET | Freq: Every day | ORAL | Status: DC
  Administered 2020-12-05: 20 mg via ORAL
  Filled 2020-12-04: qty 1

## 2020-12-04 MED ORDER — HYDRALAZINE HCL 20 MG/ML IJ SOLN: 10.0000 mg | INTRAMUSCULAR | Status: AC | PRN

## 2020-12-04 MED ORDER — HEPARIN (PORCINE) IN NACL 1000-0.9 UT/500ML-% IV SOLN
INTRAVENOUS | Status: DC | PRN
  Administered 2020-12-04 (×2): 500 mL

## 2020-12-04 MED ORDER — ENOXAPARIN SODIUM 40 MG/0.4ML ~~LOC~~ SOLN
40.0000 mg | SUBCUTANEOUS | Status: DC
  Administered 2020-12-05: 40 mg via SUBCUTANEOUS
  Filled 2020-12-04: qty 0.4

## 2020-12-04 MED ORDER — ASPIRIN 81 MG PO CHEW: 81.0000 mg | CHEWABLE_TABLET | ORAL | Status: DC

## 2020-12-04 MED ORDER — VERAPAMIL HCL 2.5 MG/ML IV SOLN
INTRAVENOUS | Status: AC
  Filled 2020-12-04: qty 2

## 2020-12-04 MED ORDER — LABETALOL HCL 5 MG/ML IV SOLN: 10.0000 mg | INTRAVENOUS | Status: AC | PRN

## 2020-12-04 MED ORDER — LIDOCAINE HCL (PF) 1 % IJ SOLN
INTRAMUSCULAR | Status: DC | PRN
Start: 2020-12-04 — End: 2020-12-04
  Administered 2020-12-04: 2 mL
  Administered 2020-12-04: 1 mL

## 2020-12-04 MED ORDER — SODIUM CHLORIDE 0.9 % IV SOLN: 250.0000 mL | INTRAVENOUS | Status: DC | PRN

## 2020-12-04 MED ORDER — LIDOCAINE HCL (PF) 1 % IJ SOLN
INTRAMUSCULAR | Status: AC
  Filled 2020-12-04: qty 30

## 2020-12-04 MED ORDER — ASPIRIN 81 MG PO CHEW
81.0000 mg | CHEWABLE_TABLET | ORAL | Status: AC
  Administered 2020-12-04: 81 mg via ORAL
  Filled 2020-12-04: qty 1

## 2020-12-04 MED ORDER — SODIUM CHLORIDE 0.9% FLUSH: 3.0000 mL | INTRAVENOUS | Status: DC | PRN

## 2020-12-04 MED ORDER — HEPARIN SODIUM (PORCINE) 1000 UNIT/ML IJ SOLN
INTRAMUSCULAR | Status: AC
  Filled 2020-12-04: qty 1

## 2020-12-04 MED ORDER — HEPARIN SODIUM (PORCINE) 1000 UNIT/ML IJ SOLN
INTRAMUSCULAR | Status: DC | PRN
  Administered 2020-12-04: 4000 [IU] via INTRAVENOUS

## 2020-12-04 MED ORDER — SODIUM CHLORIDE 0.9 % IV SOLN: INTRAVENOUS | Status: AC

## 2020-12-04 MED ORDER — HEPARIN (PORCINE) IN NACL 1000-0.9 UT/500ML-% IV SOLN
INTRAVENOUS | Status: AC
  Filled 2020-12-04: qty 1500

## 2020-12-04 MED ORDER — FENTANYL CITRATE (PF) 100 MCG/2ML IJ SOLN
INTRAMUSCULAR | Status: AC
  Filled 2020-12-04: qty 2

## 2020-12-04 MED ORDER — VERAPAMIL HCL 2.5 MG/ML IV SOLN
INTRAVENOUS | Status: DC | PRN
  Administered 2020-12-04: 10 mL via INTRA_ARTERIAL

## 2020-12-04 MED ORDER — MIDAZOLAM HCL 2 MG/2ML IJ SOLN
INTRAMUSCULAR | Status: DC | PRN
  Administered 2020-12-04: 1 mg via INTRAVENOUS

## 2020-12-04 MED ORDER — FENTANYL CITRATE (PF) 100 MCG/2ML IJ SOLN
INTRAMUSCULAR | Status: DC | PRN
  Administered 2020-12-04: 25 ug via INTRAVENOUS

## 2020-12-04 MED ORDER — MIDAZOLAM HCL 2 MG/2ML IJ SOLN
INTRAMUSCULAR | Status: AC
  Filled 2020-12-04: qty 2

## 2020-12-04 SURGICAL SUPPLY — 10 items
CATH 5FR JL3.5 JR4 ANG PIG MP (CATHETERS) ×2 IMPLANT
CATH SWAN GANZ 7F STRAIGHT (CATHETERS) ×2 IMPLANT
DEVICE RAD COMP TR BAND LRG (VASCULAR PRODUCTS) ×2 IMPLANT
GLIDESHEATH SLEND SS 6F .021 (SHEATH) ×2 IMPLANT
GLIDESHEATH SLENDER 7FR .021G (SHEATH) ×2 IMPLANT
GUIDEWIRE INQWIRE 1.5J.035X260 (WIRE) ×1 IMPLANT
INQWIRE 1.5J .035X260CM (WIRE) ×2
KIT HEART LEFT (KITS) ×2 IMPLANT
PACK CARDIAC CATHETERIZATION (CUSTOM PROCEDURE TRAY) ×2 IMPLANT
TRANSDUCER W/STOPCOCK (MISCELLANEOUS) ×2 IMPLANT

## 2020-12-04 NOTE — TOC Progression Note (Signed)
Transition of Care (TOC) - Progression Note  Heart Failure   Patient Details  Name: Edward Mullen MRN: [id number] Date of Birth: 03/17/1956  Transition of Care [facility]) CM/SW [place], [place] Phone Number: 12/04/2020, 11:45 AM  Clinical Narrative:    CSW followed up with the patient at bedside to discuss about his housing situation and possible eviction on 12/11/2020. Mr. Bezek reported that he did not hear back from Dr. Veverly Fells at the [facility] eviction program but that he left her a voicemail. Mr. Derasmo reported he hasn't reached out to other agencies or family members for support but he will once he gets back from his heart cath today. CSW continues to encourage Mr. Ziegler to contact DSS and family for support. Mr. Strick asked CSW to get in touch with Mrs. Dukes the housing manager at his residence for more information. CSW called Mrs. Dukes [phone] and spoke with Mrs. Dukes who reported that Mr. Towson needs to pay the full $3422 on or before 12/11/2020 for him not to be evicted. Mrs. Dukes indicated there is a resident Education officer, museum at the building who could help Mr. Highfill set up direct deposit but that he hasn't done that and hasn't made a payment since April of last year 2021. Mrs. Dukes reported that he would be able to come back to the residence even after being evicted but that he would still have to pay the balance owed before they would allow him back. CSW will continue to follow up with Mr. Suchy.   TOC will continue to follow for d/c needs.     Barriers to Discharge: Continued Medical Work up  Expected Discharge Plan and Services   In-house Referral: Clinical Social Work     Living arrangements for the past 2 months: Apartment                                       Social Determinants of Health (SDOH) Interventions Food Insecurity Interventions: Intervention Not Indicated,Other (Comment) (Patient reports that he gets $29 in food stamps  for the month which doesn't last him very long.) Financial Strain Interventions: Other (Comment) (Will look into resources and connect with HF outpatient CSW) Housing Interventions: Other (Comment) (Patient reports being $3000 behind on his rent) Transportation Interventions: Brewing technologist (Comment) (patient reports he has bus passes but [facility] would help him)  Readmission Risk Interventions Readmission Risk Prevention Plan 06/10/2019  Transportation Screening Complete  PCP or Specialist Appt within 5-7 Days Complete  Home Care Screening Complete  Medication Review (RN CM) Complete  Some recent data might be hidden   Braylen Denunzio, MSW, LCSWA [phone] Heart Failure Social Worker

## 2020-12-04 NOTE — Interval H&P Note (Signed)
History and Physical Interval Note:  12/04/2020 1:12 PM  Edward Mullen  has presented today for surgery, with the diagnosis of chest pain.  The various methods of treatment have been discussed with the patient and family. After consideration of risks, benefits and other options for treatment, the patient has consented to  Procedure(s): RIGHT/LEFT HEART CATH AND CORONARY ANGIOGRAPHY (N/A) as a surgical intervention.  The patient's history has been reviewed, patient examined, no change in status, stable for surgery.  I have reviewed the patient's chart and labs.  Questions were answered to the patient's satisfaction.     Emelly Wurtz [employer]

## 2020-12-04 NOTE — Progress Notes (Signed)
Pt's foley was removed per order without any issue but there was some bloody clots on the urethral orifice.

## 2020-12-04 NOTE — Progress Notes (Addendum)
Patient ID: Edward Mullen, male   DOB: [birth year], 64 y.o.   MRN: [id number]     Advanced Heart Failure Rounding Note  PCP-Cardiologist: No primary care provider on file.   Subjective:    Creatinine 1.5>1.7>1.7>1.48> pending.  SBP 90s-100s.   Has had additional NSVT runs.    Feels ok. No complaints.    Objective:   Weight Range: 78.8 kg Body mass index is 23.56 kg/m.   Vital Signs:   Temp:  [97.5 F (36.4 C)-98.1 F (36.7 C)] 97.6 F (36.4 C) (04/25 0821) Pulse Rate:  [63-71] 63 (04/25 0821) Resp:  [17-18] 18 (04/25 0821) BP: (98-103)/(73-84) 98/73 (04/25 0821) SpO2:  [96 %-100 %] 100 % (04/25 0821) Weight:  [78.8 kg] 78.8 kg (04/25 0313) Last BM Date: 12/03/20  Weight change: Filed Weights   12/02/20 0439 12/03/20 0444 12/04/20 0313  Weight: 80.8 kg 78.9 kg 78.8 kg    Intake/Output:   Intake/Output Summary (Last 24 hours) at 12/04/2020 0957 Last data filed at 12/04/2020 0318 Gross per 24 hour  Intake 3 ml  Output 3000 ml  Net -2997 ml      Physical Exam  General:  No resp difficulty HEENT: normal Neck: supple. no JVD. Carotids 2+ bilat; no bruits. No lymphadenopathy or thryomegaly appreciated. Cor: PMI nondisplaced. Regular rate & rhythm. No rubs, gallops or murmurs. Lungs: clear Abdomen: soft, nontender, nondistended. No hepatosplenomegaly. No bruits or masses. Good bowel sounds. Extremities: no cyanosis, clubbing, rash, edema Neuro: alert & orientedx3, cranial nerves grossly intact. moves all 4 extremities w/o difficulty. Affect pleasant  Telemetry  SR with PVCs and brief NSVT  EKG    N/A  Labs    CBC No results for input(s): WBC, NEUTROABS, HGB, HCT, MCV, PLT in the last 72 hours. Basic Metabolic Panel Recent Labs    12/02/20 0401 12/03/20 0210  NA 138 137  K 4.9 4.2  CL 102 99  CO2 28 31  GLUCOSE 107* 97  BUN 27* 25*  CREATININE 1.70* 1.48*  CALCIUM 8.5* 8.7*  MG 2.0 2.1   Liver Function Tests No results for input(s): AST,  ALT, ALKPHOS, BILITOT, PROT, ALBUMIN in the last 72 hours. No results for input(s): LIPASE, AMYLASE in the last 72 hours. Cardiac Enzymes No results for input(s): CKTOTAL, CKMB, CKMBINDEX, TROPONINI in the last 72 hours.  BNP: BNP (last 3 results) Recent Labs    07/27/20 0522 08/01/20 1046 11/28/20 1042  BNP 1,901.4* 447.6* 2,565.3*    ProBNP (last 3 results) No results for input(s): PROBNP in the last 8760 hours.   D-Dimer No results for input(s): DDIMER in the last 72 hours. Hemoglobin A1C No results for input(s): HGBA1C in the last 72 hours. Fasting Lipid Panel No results for input(s): CHOL, HDL, LDLCALC, TRIG, CHOLHDL, LDLDIRECT in the last 72 hours. Thyroid Function Tests No results for input(s): TSH, T4TOTAL, T3FREE, THYROIDAB in the last 72 hours.  Invalid input(s): FREET3  Other results:   Imaging    No results found.   Medications:     Scheduled Medications: . amiodarone  200 mg Oral BID  . Chlorhexidine Gluconate Cloth  6 each Topical Daily  . digoxin  0.125 mg Oral Daily  . enoxaparin (LOVENOX) injection  40 mg Subcutaneous Q24H  . eplerenone  25 mg Oral Daily  . polyethylene glycol  17 g Oral Daily  . rosuvastatin  5 mg Oral Daily  . sodium chloride flush  3 mL Intravenous Q12H  . sodium chloride flush  3 mL Intravenous Q12H  . tamsulosin  0.4 mg Oral QPC supper  . [START ON 12/05/2020] torsemide  20 mg Oral Daily  . traZODone  50 mg Oral QHS    Infusions: . sodium chloride    . sodium chloride    . sodium chloride 10 mL/hr at 12/04/20 0547    PRN Medications: sodium chloride, sodium chloride, acetaminophen, albuterol, ondansetron (ZOFRAN) IV, sodium chloride flush, sodium chloride flush     Assessment/Plan     1. Acute on chronic systolic CHF: EF 16-10% with moderate to severe MR on echo 1/18. Nonischemic cardiomyopathy. HTN vs cocaine use vs viral vs ETOH. HIV negative, SPEP with no M-spike. Patient did have CAD but not enough to  explain cardiomyopathy on 2018 cath. Echo in 8/18 showed EF up to 50%.  Patient was lost to followup in HF clinic, now back with CHF and echo showing EF 20-25%, moderate LVH, normal RV.  He has been off his meds, back on cocaine.  Still has wide LBBB.  Drop in EF may be due to recurrent cocaine abuse, medication noncompliance, and LBBB (nonischemic cardiomyopathy).  However, he also has frequent NSVT runs and had CAD on prior cath.  Cannot rule out worsening CAD. - Volume status stable. Hold torsemide for now and adjust post cath.    - Continue eplerenone 25 mg daily.   - Continue digoxin 0.125 daily.  - BP too low for ARNI/ARB/beta blocker right now.   - Stat BMET ordered.  - Needs to cut out cocaine totally.  - RHC/LHC probably Monday when creatinine is stabilized.  - Not going to be an ICD candidate until he is drug-free.   2. HTN:BP soft.  3. Cocaine abuse:  He has restarted cocaine use.  I encouraged him to stay off cocaine.    4. NSVT/PVCs:  Frequent.   - Continue amiodarone 200 mg bid to suppress.  5. AKI on CKD Stage 3: Creatinine 1.7 today.   6. CAD: 80% stenosis PLV on cath in 2018.  No ischemic chest pain, but EF has dropped again and he has had frequent NSVT.   - Continue ASA 81 daily and Crestor 5 mg daily.   - LHC/RHC today.  Discussed risks/benefits with patient and he agrees to procedure.   BMET pending.   Amy Clegg NP-C  12/04/2020 9:57 AM  Patient seen with NP, agree with the above note.   RHC/LHC:  1. Normal filling pressures.  2. Preserved cardiac output.  3. 80% stenosis in small branch of the PLV, no change from prior cath.    Creatinine 1.7.    General: NAD Neck: No JVD, no thyromegaly or thyroid nodule.  Lungs: Clear to auscultation bilaterally with normal respiratory effort. CV: Nondisplaced PMI.  Heart regular S1/S2, no S3/S4, no murmur.  No peripheral edema.  Abdomen: Soft, nontender, no hepatosplenomegaly, no distention.  Skin: Intact without lesions  or rashes.  Neurologic: Alert and oriented x 3.  Psych: Normal affect. Extremities: No clubbing or cyanosis.  HEENT: Normal.   No change in patient's coronary angiogram from the past, lower EF is likely due to cocaine use.  Filling pressures normal.  Hold torsemide today, will probably start tomorrow.  Gentle hydration post-cath for CKD.   No BP room to adjust GDMT at this point.    Home soon.   Loralie Champagne 12/04/2020 4:26 PM

## 2020-12-04 NOTE — Progress Notes (Signed)
Subjective:   Overnight events: 5 beat run NSVT, asymptomatic.  Edward Mullen states he is feeling okay this morning but is having foot numbness bilaterally. It is affecting the bottom of his feet primarily. He is unsure about the chronicity. He denies any SOB, orthopnea.   Objective:  Vital signs in last 24 hours: Vitals:   12/03/20 0444 12/03/20 0810 12/03/20 1927 12/04/20 0313  BP: 94/73 100/73 99/84 103/83  Pulse: 65 78 68 71  Resp: 18 20 18 17   Temp: (!) 97.4 F (36.3 C) 97.8 F (36.6 C) 98.1 F (36.7 C) (!) 97.5 F (36.4 C)  TempSrc: Oral Oral Oral Oral  SpO2: 97% 98% 97% 96%  Weight: 78.9 kg   78.8 kg  Height:       Physical Exam Vitals and nursing note reviewed.  Constitutional:      General: He is not in acute distress.    Appearance: He is normal weight.  HENT:     Head: Normocephalic and atraumatic.  Neck:     Vascular: No JVD.  Cardiovascular:     Rate and Rhythm: Normal rate and regular rhythm.     Heart sounds: No murmur heard.   Pulmonary:     Effort: Pulmonary effort is normal. No respiratory distress.     Breath sounds: No wheezing or rales.  Abdominal:     General: Bowel sounds are normal.     Palpations: Abdomen is soft.  Musculoskeletal:     Right lower leg: No edema.     Left lower leg: No edema.  Neurological:     General: No focal deficit present.     Mental Status: He is alert and oriented to person, place, and time. Mental status is at baseline.  Psychiatric:        Mood and Affect: Mood normal.        Behavior: Behavior normal.    Assessment/Plan:  Active Problems:   Acute HFrEF (heart failure with reduced ejection fraction) ([place])   Urinary retention  Edward Mullen is a 65 year old gentleman with a past medical history of HFrEF, polysubstance abuse who is currently admitted for acute exacerbation of chronic HFrEF in the setting of cocaine use and difficulty obtaining medication.  Acute on chronic combined systolic and  diastolic heart failure Dilated cardiomyopathy with moderate hypertrophy noted on TTE. Heart cath today demonstrated non-obstructive coronary atherosclerosis. Given these findings, his exacerbation is likely due to cocaine use. Euvolemic on examination with normal RA pressures on cath; starting maintenance dose diuretics.   -Cardiology following; appreciate their recommendations  -Maintaining K> 4.0 and Mg> 2.0 in the setting of diuresis, replete as needed -Daily BMP and magnesium while admitted  -Strict I/O's, daily standing weights -Start Torsemide 20 mg daily  -Continue Digoxin and Eplerenone started  Acute on CKD IIIa Baseline creatinine appears to be around 1.4 on chart review. Baseline GFR appears to be around 47. Creatinine continues to fluctuate between 1.4 to 1.7 depending on patient's fluid status.   -Avoid nephrotoxic medications -daily BMP  NSVT Patient with history of asymptomatic NSVT in the setting of HFrEF.   -Continue amiodarone 200 mg twice daily per cardiology  Undiagnosed COPD Chest x-ray showing hyperinflated lungs likely consistent with undiagnosed COPD given history of significant tobacco use (although unable to quantify amount and duration). No wheezing on examination today.   -Albuterol PRN  BPH PSA low at 2.7.   -Coude cath in place -Trial Flomax 0.4 mg with dinner daily. Will attempt  to remove catheter today and resume bladder scan q6h. If volume >250 post-void, may need catheter re-inserted.   Polysubstance use Patient with history of polysubstance use including significant tobacco use and intermittent cocaine use (uses cocaine 1 time monthly).  Would benefit from substance use counseling especially in regards to stopping cocaine use in the setting of his heart failure.  Scrotal Mass Scrotal ultrasound showing small right hydrocele and unchanged simple cyst in left inguinal canal. No tenderness associated with scrotal mass.  This can be followed up as  outpatient.  Prior to Admission Living Arrangement: Home Anticipated Discharge Location: TBD Barriers to Discharge: continued medical management Dispo: Anticipated discharge in approximately 1-2 day(s).   Dr. Jose Persia Internal Medicine PGY-2  Pager: [phone] After 5pm on weekdays and 1pm on weekends: On Call pager [phone]  12/04/2020, 3:02 PM

## 2020-12-05 ENCOUNTER — Other Ambulatory Visit (HOSPITAL_COMMUNITY): Payer: Self-pay

## 2020-12-05 DIAGNOSIS — I472 Ventricular tachycardia: Secondary | ICD-10-CM | POA: Diagnosis not present

## 2020-12-05 DIAGNOSIS — N1831 Chronic kidney disease, stage 3a: Secondary | ICD-10-CM | POA: Diagnosis not present

## 2020-12-05 DIAGNOSIS — N509 Disorder of male genital organs, unspecified: Secondary | ICD-10-CM

## 2020-12-05 DIAGNOSIS — J449 Chronic obstructive pulmonary disease, unspecified: Secondary | ICD-10-CM | POA: Diagnosis not present

## 2020-12-05 DIAGNOSIS — I5043 Acute on chronic combined systolic (congestive) and diastolic (congestive) heart failure: Secondary | ICD-10-CM | POA: Diagnosis not present

## 2020-12-05 LAB — POCT I-STAT EG7
Acid-Base Excess: 5 mmol/L — ABNORMAL HIGH (ref 0.0–2.0)
Bicarbonate: 31.8 mmol/L — ABNORMAL HIGH (ref 20.0–28.0)
Calcium, Ion: 1.18 mmol/L (ref 1.15–1.40)
HCT: 46 % (ref 39.0–52.0)
Hemoglobin: 15.6 g/dL (ref 13.0–17.0)
O2 Saturation: 72 %
Potassium: 4.6 mmol/L (ref 3.5–5.1)
Sodium: 139 mmol/L (ref 135–145)
TCO2: 33 mmol/L — ABNORMAL HIGH (ref 22–32)
pCO2, Ven: 51.4 mmHg (ref 44.0–60.0)
pH, Ven: 7.399 (ref 7.250–7.430)
pO2, Ven: 39 mmHg (ref 32.0–45.0)

## 2020-12-05 LAB — BASIC METABOLIC PANEL
Anion gap: 10 (ref 5–15)
BUN: 22 mg/dL (ref 8–23)
CO2: 25 mmol/L (ref 22–32)
Calcium: 8.7 mg/dL — ABNORMAL LOW (ref 8.9–10.3)
Chloride: 101 mmol/L (ref 98–111)
Creatinine, Ser: 1.5 mg/dL — ABNORMAL HIGH (ref 0.61–1.24)
GFR, Estimated: 51 mL/min — ABNORMAL LOW (ref >60)
Glucose, Bld: 101 mg/dL — ABNORMAL HIGH (ref 70–99)
Potassium: 4.4 mmol/L (ref 3.5–5.1)
Sodium: 136 mmol/L (ref 135–145)

## 2020-12-05 LAB — MAGNESIUM: Magnesium: 2.2 mg/dL (ref 1.7–2.4)

## 2020-12-05 LAB — CBC
HCT: 44 % (ref 39.0–52.0)
Hemoglobin: 13.9 g/dL (ref 13.0–17.0)
MCH: 28.1 pg (ref 26.0–34.0)
MCHC: 31.6 g/dL (ref 30.0–36.0)
MCV: 89.1 fL (ref 80.0–100.0)
Platelets: 202 10*3/uL (ref 150–400)
RBC: 4.94 MIL/uL (ref 4.22–5.81)
RDW: 15.1 % (ref 11.5–15.5)
WBC: 5.8 10*3/uL (ref 4.0–10.5)
nRBC: 0 % (ref 0.0–0.2)

## 2020-12-05 LAB — DIGOXIN LEVEL: Digoxin Level: 0.5 ng/mL — ABNORMAL LOW (ref 0.8–2.0)

## 2020-12-05 MED ORDER — DIGOXIN 125 MCG PO TABS
0.1250 mg | ORAL_TABLET | Freq: Every day | ORAL | 1 refills | Status: DC
  Filled 2020-12-05: qty 30, 30d supply, fill #0

## 2020-12-05 MED ORDER — AMIODARONE HCL 200 MG PO TABS
200.0000 mg | ORAL_TABLET | Freq: Two times a day (BID) | ORAL | 1 refills | Status: DC
  Filled 2020-12-05: qty 60, 30d supply, fill #0

## 2020-12-05 MED ORDER — EMPAGLIFLOZIN 10 MG PO TABS
10.0000 mg | ORAL_TABLET | Freq: Every day | ORAL | Status: DC
  Administered 2020-12-05: 10 mg via ORAL
  Filled 2020-12-05: qty 1

## 2020-12-05 MED ORDER — TAMSULOSIN HCL 0.4 MG PO CAPS
0.4000 mg | ORAL_CAPSULE | Freq: Every day | ORAL | 2 refills | Status: DC
Start: 2020-12-05 — End: 2021-02-20
  Filled 2020-12-05: qty 30, 30d supply, fill #0

## 2020-12-05 MED ORDER — ALBUTEROL SULFATE HFA 108 (90 BASE) MCG/ACT IN AERS
1.0000 | INHALATION_SPRAY | Freq: Four times a day (QID) | RESPIRATORY_TRACT | 1 refills | Status: DC | PRN
  Filled 2020-12-05: qty 8.5, 20d supply, fill #0

## 2020-12-05 MED ORDER — EMPAGLIFLOZIN 10 MG PO TABS
10.0000 mg | ORAL_TABLET | Freq: Every day | ORAL | 1 refills | Status: DC
  Filled 2020-12-05: qty 30, 30d supply, fill #0

## 2020-12-05 MED ORDER — EPLERENONE 25 MG PO TABS
25.0000 mg | ORAL_TABLET | Freq: Every day | ORAL | 1 refills | Status: DC
  Filled 2020-12-05: qty 30, 30d supply, fill #0

## 2020-12-05 NOTE — Evaluation (Signed)
Occupational Therapy Evaluation Patient Details Name: Edward Mullen MRN: [id number] DOB: [birth year] Today's Date: 12/05/2020    History of Present Illness Patient is a 65 y/o male who presents on 11/28/20 with SOB and bil ankle swelling. Found to have acute on chronic exacerbation with volume overload in the setting of medication non compliance, also with NSVT. s/p cardiac cath 12/04/20. CXR- cardiomegaly. BNP>2500. PMH includes CHF, HTN.   Clinical Impression   Pt admitted to the ED for concerns listed above. PTA pt reported being independent in all ADL's and IADL's, using a can for mobility as needed. Pt reports taking care of all his grocery shopping, using the bus system to get to and from doctors, and completes all his own cooking and cleaning. During the evaluation, pt demonstrated continued independence requiring assistance in any ADL's or functional mobility. Pt was education on energy conservation techniques that can be used once discharged, to which pt was very open to. At this time no further OT is needed and acute OT will discharge.     Follow Up Recommendations  No OT follow up    Equipment Recommendations  None recommended by OT    Recommendations for Other Services       Precautions / Restrictions Precautions Precautions: Fall Restrictions Weight Bearing Restrictions: No      Mobility Bed Mobility Overal bed mobility: Modified Independent Bed Mobility: Supine to Sit;Sit to Supine     Supine to sit: Modified independent (Device/Increase time);HOB elevated Sit to supine: Modified independent (Device/Increase time);HOB elevated   General bed mobility comments: No physical assist needed    Transfers Overall transfer level: Independent Equipment used: None Transfers: Sit to/from Stand Sit to Stand: Modified independent (Device/Increase time)         General transfer comment: No assist needed. Stood from EOB x2 in lowest setting, as well as from  toilet.    Balance Overall balance assessment: No apparent balance deficits (not formally assessed) Sitting-balance support: Feet supported;No upper extremity supported Sitting balance-Leahy Scale: Normal Sitting balance - Comments: Able to donn socks without difficulty.   Standing balance support: During functional activity Standing balance-Leahy Scale: Good                             ADL either performed or assessed with clinical judgement   ADL Overall ADL's : Independent;At baseline                                       General ADL Comments: Pt completed dressing sitting EOB, Grooming standing at the sink, and toileting in the bathroom all with independence. Pt experienced no LOB and ambulated with out his cane in the room.     Vision Baseline Vision/History: No visual deficits Patient Visual Report: No change from baseline Vision Assessment?: No apparent visual deficits     Perception Perception Perception Tested?: No   Praxis Praxis Praxis tested?: Not tested    Pertinent Vitals/Pain Pain Assessment: No/denies pain     Hand Dominance Right   Extremity/Trunk Assessment Upper Extremity Assessment Upper Extremity Assessment: Generalized weakness (RUE is 4-/5 in all movements, LUE is 5/5.)   Lower Extremity Assessment Lower Extremity Assessment: Defer to PT evaluation   Cervical / Trunk Assessment Cervical / Trunk Assessment: Normal   Communication Communication Communication: No difficulties   Cognition Arousal/Alertness: Awake/alert Behavior During Therapy:  WFL for tasks assessed/performed Overall Cognitive Status: Within Functional Limits for tasks assessed                                 General Comments: for basic mobility tasks.   General Comments  VSS on RA.    Exercises     Shoulder Instructions      Home Living Family/patient expects to be discharged to:: Private residence Living Arrangements:  Alone Available Help at Discharge: Family;Available PRN/intermittently Type of Home: Apartment Home Access: Elevator     Home Layout: One level     Bathroom Shower/Tub: Teacher, early years/pre: Standard Bathroom Accessibility: No   Home Equipment: Cane - single point;Walker - 2 wheels          Prior Functioning/Environment Level of Independence: Independent with assistive device(s)        Comments: Uses SPC vs RW PTA. Uses transportation, Admits to falls.        OT Problem List: Decreased strength;Decreased activity tolerance;Cardiopulmonary status limiting activity      OT Treatment/Interventions:      OT Goals(Current goals can be found in the care plan section) Acute Rehab OT Goals Patient Stated Goal: to go home OT Goal Formulation: All assessment and education complete, DC therapy  OT Frequency:     Barriers to D/C:            Co-evaluation              AM-PAC OT "6 Clicks" Daily Activity     Outcome Measure Help from another person eating meals?: None Help from another person taking care of personal grooming?: None Help from another person toileting, which includes using toliet, bedpan, or urinal?: None Help from another person bathing (including washing, rinsing, drying)?: None Help from another person to put on and taking off regular upper body clothing?: None Help from another person to put on and taking off regular lower body clothing?: None 6 Click Score: 24   End of Session Equipment Utilized During Treatment: Gait belt Nurse Communication: Mobility status  Activity Tolerance: Patient tolerated treatment well Patient left: in chair;with call bell/phone within reach  OT Visit Diagnosis: Unsteadiness on feet (R26.81);History of falling (Z91.81);Muscle weakness (generalized) (M62.81)                Time: 1010-1027 OT Time Calculation (min): 17 min Charges:  OT General Charges $OT Visit: 1 Visit OT Evaluation $OT Eval Low  Complexity: Kyle., OTR/L Acute Rehabilitation  Jenavee Laguardia Elane Yolanda Bonine 12/05/2020, 11:46 AM

## 2020-12-05 NOTE — Plan of Care (Signed)

## 2020-12-05 NOTE — TOC Transition Note (Addendum)
Transition of Care [facility]) - CM/SW Discharge Note Heat Failure   Patient Details  Name: Edward Mullen MRN: [id number] Date of Birth: [birth year]  [facility]) CM/SW Contact:  [place], [place] Phone Number: 12/05/2020, 10:03 AM   Clinical Narrative:    CSW spoke with the patient at bedside. Patient to discharge this afternoon and reported a family member will pick him up. CSW and patient followed up discussion about his eviction. CSW encouraged the patient to follow up with DSS and bring them his eviction notice to get help with paying the amount due by 12/11/20 and to reach out to Dr. Veverly Fells at [facility] eviction program and family, friends for support as well. The patient reported understanding. CSW brought the patient an appointment card for the [facility] outpatient appointment and reiterated to the patient to please follow up at that appointment and continue to reach out so that CSW HF at outpatient clinic can provide support and the patient reported understanding.  CSW will sign off for now as social work intervention is no longer needed. Please consult [place] again if new needs arise.     Barriers to Discharge: Continued Medical Work up   Patient Goals and CMS Choice        Discharge Placement                       Discharge Plan and Services In-house Referral: Clinical Social Work                                   Social Determinants of Health (SDOH) Interventions Food Insecurity Interventions: Intervention Not Indicated,Other (Comment) (Patient reports that he gets $29 in food stamps for the month which doesn't last him very long.) Financial Strain Interventions: Other (Comment) (Will look into resources and connect with HF outpatient CSW) Housing Interventions: Other (Comment) (Patient reports being $3000 behind on his rent) Transportation Interventions: Brewing technologist (Comment) (patient reports he has bus passes but [facility] would help him)   Readmission Risk Interventions Readmission Risk Prevention Plan 06/10/2019  Transportation Screening Complete  PCP or Specialist Appt within 5-7 Days Complete  Home Care Screening Complete  Medication Review (RN CM) Complete  Some recent data might be hidden    Shandelle Borrelli, MSW, LCSWA [phone] Heart Failure Social Worker

## 2020-12-05 NOTE — Evaluation (Signed)
Physical Therapy Evaluation Patient Details Name: Edward Mullen MRN: [id number] DOB: [birth year] Today's Date: 12/05/2020   History of Present Illness  Patient is a 65 y/o male who presents on 11/28/20 with SOB and bil ankle swelling. Found to have acute on chronic exacerbation with volume overload in the setting of medication non compliance, also with NSVT. s/p cardiac cath 12/04/20. CXR- cardiomegaly. BNP>2500. PMH includes CHF, HTN.  Clinical Impression  Patient presents with generalized weakness, impaired balance, dyspnea on exertion and impaired mobility s/p above. Pt lives at home alone and reports being MOd I for ADLs and ambulation using RW vs SPC as needed PTA.Marland Kitchen Uses transportation and has family close by. Today, pt tolerated transfers and gait training with supervision for safety due to imbalance but no overt LOB. Balance will likely be better with use of DME. VSS on RA. Pt eager to return home. Discussed importance of slowly increasing activity at home and using [facility] for support to decrease fall risk. Pt does not require acute therapy services as pt reports functioning close to baseline. Discharge from therapy.    Follow Up Recommendations No PT follow up    Equipment Recommendations  None recommended by PT    Recommendations for Other Services       Precautions / Restrictions Precautions Precautions: Fall Restrictions Weight Bearing Restrictions: No      Mobility  Bed Mobility Overal bed mobility: Needs Assistance Bed Mobility: Supine to Sit;Sit to Supine     Supine to sit: Modified independent (Device/Increase time) Sit to supine: Modified independent (Device/Increase time)   General bed mobility comments: No assist needed.    Transfers Overall transfer level: Needs assistance Equipment used: None Transfers: Sit to/from Stand Sit to Stand: Modified independent (Device/Increase time)         General transfer comment: No assist needed. Stood from [employer].  Ambulation/Gait Ambulation/Gait assistance: Supervision Gait Distance (Feet): 400 Feet Assistive device: None Gait Pattern/deviations: Step-through pattern;Decreased stride length;Staggering left;Staggering right Gait velocity: decreased   General Gait Details: Slow, mildly unsteady gait with staggering noted, reports feeling off balance but also declined using SPC for ambulation. 2/4 DOE. VSS on RA.  Stairs            Wheelchair Mobility    Modified Rankin (Stroke Patients Only)       Balance Overall balance assessment: Needs assistance Sitting-balance support: Feet supported;No upper extremity supported Sitting balance-Leahy Scale: Normal Sitting balance - Comments: Able to donn socks without difficulty.   Standing balance support: During functional activity Standing balance-Leahy Scale: Fair                               Pertinent Vitals/Pain Pain Assessment: No/denies pain    Home Living Family/patient expects to be discharged to:: Private residence Living Arrangements: Alone Available Help at Discharge: Family;Available PRN/intermittently Type of Home: Apartment Home Access: Elevator     Home Layout: One level Home Equipment: Cane - single point;Walker - 2 wheels      Prior Function Level of Independence: Independent with assistive device(s)         Comments: Uses SPC vs RW PTA. Uses transportation, Admits to falls.     Hand Dominance   Dominant Hand: Right    Extremity/Trunk Assessment   Upper Extremity Assessment Upper Extremity Assessment: Defer to OT evaluation    Lower Extremity Assessment Lower Extremity Assessment: Generalized weakness (but functional)    Cervical /  Trunk Assessment Cervical / Trunk Assessment: Normal  Communication   Communication: No difficulties  Cognition Arousal/Alertness: Awake/alert Behavior During Therapy: WFL for tasks assessed/performed Overall Cognitive Status: Within Functional  Limits for tasks assessed                                 General Comments: for basic mobility tasks.      General Comments General comments (skin integrity, edema, etc.): VSS on RA.    Exercises     Assessment/Plan    PT Assessment Patent does not need any further PT services  PT Problem List         PT Treatment Interventions      PT Goals (Current goals can be found in the Care Plan section)  Acute Rehab PT Goals Patient Stated Goal: to go home and get help getting my rent paid PT Goal Formulation: All assessment and education complete, DC therapy    Frequency     Barriers to discharge        Co-evaluation               AM-PAC PT "6 Clicks" Mobility  Outcome Measure Help needed turning from your back to your side while in a flat bed without using bedrails?: None Help needed moving from lying on your back to sitting on the side of a flat bed without using bedrails?: None Help needed moving to and from a bed to a chair (including a wheelchair)?: None Help needed standing up from a chair using your arms (e.g., wheelchair or bedside chair)?: None Help needed to walk in hospital room?: A Little Help needed climbing 3-5 steps with a railing? : A Little 6 Click Score: 22    End of Session   Activity Tolerance: Patient tolerated treatment well;Patient limited by fatigue Patient left: in bed;with call bell/phone within reach Nurse Communication: Mobility status PT Visit Diagnosis: Unsteadiness on feet (R26.81);Muscle weakness (generalized) (M62.81)    Time: 3295-1884 PT Time Calculation (min) (ACUTE ONLY): 13 min   Charges:   PT Evaluation $PT Eval Moderate Complexity: 1 Mod          Marisa Severin, PT, DPT Acute Rehabilitation Services Pager [phone] Office [phone]      Marguarite Arbour A Sabra Heck 12/05/2020, 9:56 AM

## 2020-12-05 NOTE — Care Management Important Message (Signed)
Important Message  Patient Details  Name: DAJUAN TURNLEY MRN: [id number] Date of Birth: 06-03-1956   Medicare Important Message Given:  Yes     Shelda Altes 12/05/2020, 8:32 AM

## 2020-12-05 NOTE — Progress Notes (Signed)
   12/05/20 1208  Vitals  Temp 97.8 F (36.6 C)  Temp Source Oral  BP 109/75  MAP (mmHg) 86  BP Location Left Arm  BP Method Automatic  Patient Position (if appropriate) Lying  Pulse Rate 74  Pulse Rate Source Monitor  Resp 18  MEWS COLOR  MEWS Score Color Green  Oxygen Therapy  SpO2 98 %  O2 Device Room Air  MEWS Score  MEWS Temp 0  MEWS Systolic 0  MEWS Pulse 0  MEWS RR 0  MEWS LOC 0  MEWS Score 0   Per CCMD pt had 16 bts run of vtach.  Pt asymptomatic. Vitals taken. Pt denies SOB,chest pain or dizziness.  Clegg NP made aware. Bedside nurse aware.

## 2020-12-05 NOTE — Progress Notes (Addendum)
Patient ID: Edward Mullen, male   DOB: [birth year], 65 y.o.   MRN: [id number]     Advanced Heart Failure Rounding Note  PCP-Cardiologist: No primary care provider on file.   Subjective:   Cath 4/26 with stable coronary disease. Normal filling pressures and preserved cardiac output.   Feeling better. Wants to go home.     Objective:   Weight Range: 80 kg Body mass index is 23.92 kg/m.   Vital Signs:   Temp:  [97.6 F (36.4 C)-98.4 F (36.9 C)] 97.8 F (36.6 C) (04/26 0806) Pulse Rate:  [63-79] 66 (04/26 0806) Resp:  [13-19] 18 (04/26 0806) BP: (92-122)/(71-95) 109/82 (04/26 0806) SpO2:  [0 %-100 %] 100 % (04/26 0806) Weight:  [80 kg] 80 kg (04/26 0247) Last BM Date: 12/03/20  Weight change: Filed Weights   12/04/20 0313 12/05/20 0000 12/05/20 0247  Weight: 78.8 kg 80 kg 80 kg    Intake/Output:   Intake/Output Summary (Last 24 hours) at 12/05/2020 0819 Last data filed at 12/05/2020 0800 Gross per 24 hour  Intake 2035.35 ml  Output 2050 ml  Net -14.65 ml      Physical Exam  General:  No resp difficulty HEENT: normal Neck: supple. no JVD. Carotids 2+ bilat; no bruits. No lymphadenopathy or thryomegaly appreciated. Cor: PMI nondisplaced. Regular rate & rhythm. No rubs, gallops or murmurs. Lungs: Coarse throughout.  Abdomen: soft, nontender, nondistended. No hepatosplenomegaly. No bruits or masses. Good bowel sounds. Extremities: no cyanosis, clubbing, rash, edema Neuro: alert & orientedx3, cranial nerves grossly intact. moves all 4 extremities w/o difficulty. Affect pleasant  Telemetry  SR with rare PVCs   EKG    N/A  Labs    CBC Recent Labs    12/04/20 1329 12/05/20 0343  WBC  --  5.8  HGB 15.6 13.9  HCT 46.0 44.0  MCV  --  89.1  PLT  --  301   Basic Metabolic Panel Recent Labs    12/03/20 0210 12/04/20 0900 12/04/20 1329 12/05/20 0343  NA 137 138 139 136  K 4.2 4.6 4.6 4.4  CL 99 99  --  101  CO2 31 32  --  25  GLUCOSE 97 83  --   101*  BUN 25* 24*  --  22  CREATININE 1.48* 1.70*  --  1.50*  CALCIUM 8.7* 8.6*  --  8.7*  MG 2.1  --   --   --    Liver Function Tests No results for input(s): AST, ALT, ALKPHOS, BILITOT, PROT, ALBUMIN in the last 72 hours. No results for input(s): LIPASE, AMYLASE in the last 72 hours. Cardiac Enzymes No results for input(s): CKTOTAL, CKMB, CKMBINDEX, TROPONINI in the last 72 hours.  BNP: BNP (last 3 results) Recent Labs    07/27/20 0522 08/01/20 1046 11/28/20 1042  BNP 1,901.4* 447.6* 2,565.3*    ProBNP (last 3 results) No results for input(s): PROBNP in the last 8760 hours.   D-Dimer No results for input(s): DDIMER in the last 72 hours. Hemoglobin A1C Recent Labs    12/04/20 0900  HGBA1C 5.6   Fasting Lipid Panel No results for input(s): CHOL, HDL, LDLCALC, TRIG, CHOLHDL, LDLDIRECT in the last 72 hours. Thyroid Function Tests Recent Labs    12/04/20 0900  TSH 2.386    Other results:   Imaging    CARDIAC CATHETERIZATION  Result Date: 12/04/2020  1st RPL lesion is 80% stenosed.  Mid LAD lesion is 25% stenosed.  1. Normal filling pressures. 2. Preserved  cardiac output. 3. 80% stenosis in branch of the PLV, no change from prior cath.  No intervention.     Medications:     Scheduled Medications: . amiodarone  200 mg Oral BID  . Chlorhexidine Gluconate Cloth  6 each Topical Daily  . digoxin  0.125 mg Oral Daily  . enoxaparin (LOVENOX) injection  40 mg Subcutaneous Q24H  . eplerenone  25 mg Oral Daily  . polyethylene glycol  17 g Oral Daily  . rosuvastatin  5 mg Oral Daily  . sodium chloride flush  3 mL Intravenous Q12H  . sodium chloride flush  3 mL Intravenous Q12H  . sodium chloride flush  3 mL Intravenous Q12H  . tamsulosin  0.4 mg Oral QPC supper  . torsemide  20 mg Oral Daily  . traZODone  50 mg Oral QHS    Infusions: . sodium chloride 75 mL/hr (12/04/20 1330)  . sodium chloride      PRN Medications: sodium chloride, sodium chloride,  acetaminophen, albuterol, ondansetron (ZOFRAN) IV, sodium chloride flush, sodium chloride flush     Assessment/Plan     1. Acute on chronic systolic CHF: EF 58-09% with moderate to severe MR on echo 1/18. Nonischemic cardiomyopathy. HTN vs cocaine use vs viral vs ETOH. HIV negative, SPEP with no M-spike. Patient did have CAD but not enough to explain cardiomyopathy on 2018 cath. Echo in 8/18 showed EF up to 50%.  Patient was lost to followup in HF clinic, now back with CHF and echo showing EF 20-25%, moderate LVH, normal RV.  He has been off his meds, back on cocaine.  Still has wide LBBB.  Drop in EF may be due to recurrent cocaine abuse, medication noncompliance, and LBBB (nonischemic cardiomyopathy).  However, he also has frequent NSVT runs and had CAD on prior cath. Repeat 4/25 Cath stable with preserved cardiac output.  - Volume status stable.  -  Start jardiance 10 mg daily. Will hold off on torsemide   - Continue eplerenone 25 mg daily.   - Continue digoxin 0.125 daily.  - BP too low for ARNI/ARB/beta blocker right now.  - Needs to cut out cocaine totally.  - - Not going to be an ICD candidate until he is drug-free.   2. HTN:BP soft.  3. Cocaine abuse:  He has restarted cocaine use.  I encouraged him to stay off cocaine.    4. NSVT/PVCs:  Frequent.   - Continue amiodarone 200 mg bid to suppress.  5. AKI on CKD Stage 3: Creatinine 1.5 today.  Stable  6. CAD: 80% stenosis PLV on cath in 2018.  No ischemic chest pain, but EF has dropped again and he has had frequent NSVT.   - Continue ASA 81 daily and Crestor 5 mg daily. - Cath 4/25 stable. No change from previous.   Looks good today. We will set up follow in HF clinic.  HF meds for d/c  Jardiance 10 mg daily Eplerenone 25 mg daily  Digoxin 0.125 mg daily.  Amio 200 mg twice a day until we see in follow up. Aspirin 81 mg daily  Crestor 5 mg daily    Amy Clegg NP-C  12/05/2020 8:19 AM  Patient seen with NP, agree with the  above note.   He is stable today, think he is ready for home on the above medication regimen. Volume status looks good.  We will make followup in CHF clinic.   Loralie Champagne 12/05/2020  8:45 AM

## 2020-12-05 NOTE — Progress Notes (Signed)
D/C instructions given and reviewed. Tele and IV's removed, tolerated well. Awaiting TOC med delivery.

## 2020-12-05 NOTE — Discharge Summary (Addendum)
Name: Edward Mullen MRN: [id number] DOB: Jul 23, 1956 65 y.o. PCP: Harvie Heck, MD  Date of Admission: 11/28/2020 10:26 AM Date of Discharge:  12/05/2020 Attending Physician: Velna Ochs, MD  Discharge Diagnosis: 1. Acute on chronic systolic heart failure  2. NSVT  3. BPH  Discharge Medications: Allergies as of 12/05/2020   No Known Allergies      Medication List     STOP taking these medications    BiDil 20-37.5 MG tablet Generic drug: isosorbide-hydrALAZINE   Entresto 49-51 MG Generic drug: sacubitril-valsartan   furosemide 20 MG tablet Commonly known as: LASIX       TAKE these medications    albuterol 108 (90 Base) MCG/ACT inhaler Commonly known as: VENTOLIN HFA Inhale 1-2 puffs into the lungs every 6 (six) hours as needed for wheezing or shortness of breath.   amiodarone 200 MG tablet Commonly known as: PACERONE Take 1 tablet (200 mg total) by mouth 2 (two) times daily.   Blood Pressure Monitor Devi Please use to monitor blood pressure as directed.  ICD: I10 (hypertension)   digoxin 0.125 MG tablet Commonly known as: LANOXIN Take 1 tablet (0.125 mg total) by mouth daily. Start taking on: December 06, 2020   empagliflozin 10 MG Tabs tablet Commonly known as: JARDIANCE Take 1 tablet (10 mg total) by mouth daily. Start taking on: December 06, 2020   eplerenone 25 MG tablet Commonly known as: INSPRA Take 1 tablet (25 mg total) by mouth daily. Start taking on: December 06, 2020   rosuvastatin 5 MG tablet Commonly known as: CRESTOR TAKE 1 TABLET (5 MG TOTAL) BY MOUTH DAILY.   tamsulosin 0.4 MG Caps capsule Commonly known as: FLOMAX Take 1 capsule (0.4 mg total) by mouth daily after supper.        Disposition and follow-up:   Edward Mullen was discharged from [facility] in Stable condition.  At the hospital follow up visit please address:  1.  Acute on chronic systolic heart failure. From running of out home  medications and ongoing cocaine use. TTE showing dilated cardiomyopathy with moderate hypertrophy. Heart cath showing stable non-obstructive CAD with normal filling pressures and preserved cardiac output. Volume status stable. Discharging with jardiance 10mg  daily, eplerenone 25mg  daily, digoxin 0.125mg  daily. Stopping torsemide at discharge. Will follow up with Heart Failure clinic on 12/14/2020 and can readjust home medications as necessary. Will need extensive substance use counseling to avoid future cocaine use.  2. NSVT. Multiple instances with runs of NSVT/PVCs, although patient has been asymptomatic throughout these episodes. Discharging home with amiodarone 200mg  BID per cardiology until seen in HF clinic.   3. BPH. PSA low at 2.7. Was having urinary retention requiring coude cath placement. Started on flomax 0.4mg  daily with good results. Minimal post-void residual urine noted on bladder scans after foley removal. Discharge with flomax.  4.  Undiagnosed COPD.  Chest x-ray showing hyperinflated lungs.  Significant history of tobacco use.  Discharged with albuterol as needed.  Would benefit from pulmonology outpatient follow-up for PFTs.  5.  Scrotal mass.  Small right hydrocele and unchanged simple cyst in the left inguinal canal seen on scrotal ultrasound.  Can be followed up as outpatient.  2.  Labs / imaging needed at time of follow-up: CBC, CMP  3.  Pending labs/ test needing follow-up: NONE  Follow-up Appointments:  Follow-up Information     [facility] Follow up on 12/14/2020.   Specialty: Cardiology Why: at  8:40.  Contact information: [address] [id number] [place] [facility] [place] [facility] Course by problem list: 1. Acute on chronic systolic heart failure.  Presenting with shortness of breath and lower extremity edema found to be in acute heart failure exacerbation in setting of  recent cocaine use and running out of heart failure medications.  Very similar presentation to last hospitalization in December. BNP 2565 on admission.  Troponins flat.  Started on IV Lasix for diuresis.  Holding home BiDil and home Entresto due to soft BPs.  Last echo in 12/21 showed EF of 20 to 25% with global hypokinesis, severely dilated left ventricle, grade 3 diastolic dysfunction, severely dilated left atrium, mildly elevated pulmonary artery pressures and normal right ventricular systolic function.  Repeat echo this admission showing LVEF 20 to 25% with severely decreased LV function, global hypokinesis, moderate concentric LVH.  Left ventricular diastolic function could not be evaluated.  Cardiology was consulted for assistance with diuresis in the setting of low BP.  Continued with IV diuresis and added eplerenone 25 mg daily and digoxin 0.125 daily.  Patient not a candidate for ICD placement due to history of substance use.  He underwent right heart and left heart cath yesterday after volume stabilized.  Cath showing nonobstructive CAD unchanged from prior cath.  Patient stable for discharge today as per heart failure team.  Discharged on Jardiance 10 mg daily, digoxin 0.125 mg daily, eplerenone 25 mg daily as per cardiology recommendations.  We will stop torsemide at discharge.  We will follow-up in heart failure clinic on 12/14/2020 for outpatient follow-up and adjustment of medications as necessary.  2.  CKD IIIa.  Creatinine elevated from baseline of about 1.4-1.6 with GFR baseline ~50.  Variable creatinine remained between 1.4 and 1.7 throughout admission while adjusting volume with IV diuresis.  Today patient's creatinine is 1.5 and stable for discharge.  3.  NSVT / PVCs.  Patient with multiple runs of NSVT throughout admission, but stayed asymptomatic throughout them all.  EKG showing frequent PVCs.  Cardiology started patient on amiodarone 200 mg twice daily to suppress PVC burden and in hopes  of improving NSVT's.  Patient had improvement with fewer NSVT's seen after initiation of amiodarone however was still having some intermittently, remained asymptomatic.  Will discharge with amiodarone 200 mg twice daily and have patient follow-up in heart failure clinic.  4.  Undiagnosed COPD.  Patient had wheezing and rhonchi on exam upon admission.  Has a history of significant tobacco use although was unable to quantify how much and duration of how often he used cigarettes.  Chest x-ray showed hyperinflation consistent with COPD.  Patient has not had pulmonology follow-up in the outpatient setting and has not had PFTs to formally diagnose COPD.  Was started on scheduled DuoNebs with improvement in shortness of breath.  Would benefit from outpatient pulmonology follow-up for PFTs.  Will discharge with albuterol inhaler as needed.  5.  Urinary Retention: Likely BPH. Patient complaining of obstruction when urinating.  PSA low at 2.7.  Coud cath placed for diuresis in setting of heart failure with significant increase in UOP.  Started patient on trial of Flomax 0.4 mg with dinner daily.  Continue to diurese well.  After heart catheterization, removed Foley and started every 6 hours bladder scans.  Continue to urinate well with postvoid bladder scans negative for urinary retention.  Will discharge patient with Flomax 0.4  mg daily.  6.  Polysubstance use.  Patient with history of polysubstance use including significant tobacco use and intermittent cocaine use.  Would benefit from substance use counseling especially in regards to stopping cocaine use in the setting of his heart failure.  7.  Scrotal mass.  Patient complaining of scrotal mass but no complaints of pain.  Scrotal ultrasound showing small right hydrocele and unchanged simple cyst in left inguinal canal.  This can be followed up as outpatient.   Discharge Exam:   BP 109/75 (BP Location: Left Arm)   Pulse 74   Temp 97.8 F (36.6 C) (Oral)    Resp 18   Ht 6' (1.829 m)   Wt 80 kg   SpO2 98%   BMI 23.92 kg/m  Discharge exam:  General: Elderly male sitting up in bed, NAD. Neck: No JVD. Cardiovascular: Normal rate and regular rhythm, no murmurs rubs or gallops. Pulmonary: Clear to auscultation bilaterally, no respiratory distress or adventitious sounds noted. Musculoskeletal: No lower extremity edema noted. Skin: Warm and dry. Neurological: AAO x4, no focal deficits noted. Psych: Normal mood and behavior.   Pertinent Labs, Studies, and Procedures:  CBC Latest Ref Rng & Units 12/05/2020 12/04/2020 11/28/2020  WBC 4.0 - 10.5 K/uL 5.8 - 8.0  Hemoglobin 13.0 - 17.0 g/dL 13.9 15.6 13.9  Hematocrit 39.0 - 52.0 % 44.0 46.0 44.8  Platelets 150 - 400 K/uL 202 - 182   CMP Latest Ref Rng & Units 12/05/2020 12/04/2020 12/04/2020  Glucose 70 - 99 mg/dL 101(H) - 83  BUN 8 - 23 mg/dL 22 - 24(H)  Creatinine 0.61 - 1.24 mg/dL 1.50(H) - 1.70(H)  Sodium 135 - 145 mmol/L 136 139 138  Potassium 3.5 - 5.1 mmol/L 4.4 4.6 4.6  Chloride 98 - 111 mmol/L 101 - 99  CO2 22 - 32 mmol/L 25 - 32  Calcium 8.9 - 10.3 mg/dL 8.7(L) - 8.6(L)  Total Protein 6.5 - 8.1 g/dL - - -  Total Bilirubin 0.3 - 1.2 mg/dL - - -  Alkaline Phos 38 - 126 U/L - - -  AST 15 - 41 U/L - - -  ALT 0 - 44 U/L - - -   BNP    Component Value Date/Time   BNP 2,565.3 (H) 11/28/2020 1042   HbA1c  5.6  Hgb A1c MFr Bld  Date Value Ref Range Status  12/04/2020 5.6 4.8 - 5.6 % Final    Comment:    (NOTE) Pre diabetes:          5.7%-6.4%  Diabetes:              >6.4%  Glycemic control for   <7.0% adults with diabetes   05/03/2019 5.3 4.8 - 5.6 % Final    Comment:    (NOTE) Pre diabetes:          5.7%-6.4% Diabetes:              >6.4% Glycemic control for   <7.0% adults with diabetes    TSH  Date Value Ref Range Status  12/04/2020 2.386 0.350 - 4.500 uIU/mL Final    Comment:    Performed by a 3rd Generation assay with a functional sensitivity of <=0.01  uIU/mL. Performed at [facility], [place] [address], [place], [place] [address]   01/29/2017 0.694 0.350 - 4.500 uIU/mL Final    Comment:    Performed by a 3rd Generation assay with a functional sensitivity of <=0.01 uIU/mL.   PSA 2.7  Discharge Instructions: Discharge Instructions     (HEART FAILURE PATIENTS) Call MD:  Anytime you have any of the following symptoms: 1) 3 pound weight gain in 24 hours or 5 pounds in 1 week 2) shortness of breath, with or without a dry hacking cough 3) swelling in the hands, feet or stomach 4) if you have to sleep on extra pillows at night in order to breathe.   Complete by: As directed    Call MD for:  difficulty breathing, headache or visual disturbances   Complete by: As directed    Call MD for:  extreme fatigue   Complete by: As directed    Call MD for:  hives   Complete by: As directed    Call MD for:  persistant dizziness or light-headedness   Complete by: As directed    Call MD for:  persistant nausea and vomiting   Complete by: As directed    Call MD for:  redness, tenderness, or signs of infection (pain, swelling, redness, odor or green/yellow discharge around incision site)   Complete by: As directed    Call MD for:  severe uncontrolled pain   Complete by: As directed    Call MD for:  temperature >100.4   Complete by: As directed    Diet - low sodium heart healthy   Complete by: As directed    Discharge instructions   Complete by: As directed    Mr Garcon, it was a pleasure taking care of you during your time here. You came in with exacerbation of your heart failure and were treated for it while you were here.  Please take note of the following:  1. You are going to be started on several new medications and all of these will be brought to you before you leave the hospital. You will be start digoxin once daily, eplerenone daily, jardiance daily all for heart failure and you will need to take amiodarone twice daily for your  abnormal heart rhythm.  2. Please make sure you follow up with the [facility] on May 5th, 2022 so that they can make sure you are doing well after leaving the hospital.  3. Please continue taking flomax daily to help you go pee.   4. Please take your albuterol inhaler to help you with your breathing.   Increase activity slowly   Complete by: As directed        Signed: Virl Axe, MD 12/05/2020, 1:12 PM   Pager: [phone]

## 2020-12-14 ENCOUNTER — Other Ambulatory Visit (HOSPITAL_COMMUNITY): Payer: Self-pay

## 2020-12-14 ENCOUNTER — Ambulatory Visit (HOSPITAL_COMMUNITY)
Admit: 2020-12-14 | Discharge: 2020-12-14 | Disposition: A | Discharge status: Home / Self Care | Payer: Medicare (Managed Care) | Attending: Cardiology | Admitting: Cardiology

## 2020-12-14 ENCOUNTER — Encounter (HOSPITAL_COMMUNITY): Payer: Self-pay | Admitting: Cardiology

## 2020-12-14 ENCOUNTER — Other Ambulatory Visit: Payer: Self-pay

## 2020-12-14 VITALS — BP 110/64 | HR 68 | Wt 194.6 lb

## 2020-12-14 DIAGNOSIS — I428 Other cardiomyopathies: Secondary | ICD-10-CM | POA: Diagnosis not present

## 2020-12-14 DIAGNOSIS — I5022 Chronic systolic (congestive) heart failure: Secondary | ICD-10-CM | POA: Diagnosis not present

## 2020-12-14 DIAGNOSIS — N183 Chronic kidney disease, stage 3 unspecified: Secondary | ICD-10-CM | POA: Diagnosis not present

## 2020-12-14 DIAGNOSIS — Z7984 Long term (current) use of oral hypoglycemic drugs: Secondary | ICD-10-CM | POA: Insufficient documentation

## 2020-12-14 DIAGNOSIS — I447 Left bundle-branch block, unspecified: Secondary | ICD-10-CM | POA: Diagnosis not present

## 2020-12-14 DIAGNOSIS — E1122 Type 2 diabetes mellitus with diabetic chronic kidney disease: Secondary | ICD-10-CM | POA: Insufficient documentation

## 2020-12-14 DIAGNOSIS — F17210 Nicotine dependence, cigarettes, uncomplicated: Secondary | ICD-10-CM | POA: Insufficient documentation

## 2020-12-14 DIAGNOSIS — I472 Ventricular tachycardia: Secondary | ICD-10-CM | POA: Diagnosis not present

## 2020-12-14 DIAGNOSIS — Z7982 Long term (current) use of aspirin: Secondary | ICD-10-CM | POA: Diagnosis not present

## 2020-12-14 DIAGNOSIS — Z8249 Family history of ischemic heart disease and other diseases of the circulatory system: Secondary | ICD-10-CM | POA: Diagnosis not present

## 2020-12-14 DIAGNOSIS — Z79899 Other long term (current) drug therapy: Secondary | ICD-10-CM | POA: Insufficient documentation

## 2020-12-14 DIAGNOSIS — I2510 Atherosclerotic heart disease of native coronary artery without angina pectoris: Secondary | ICD-10-CM | POA: Insufficient documentation

## 2020-12-14 DIAGNOSIS — I130 Hypertensive heart and chronic kidney disease with heart failure and stage 1 through stage 4 chronic kidney disease, or unspecified chronic kidney disease: Secondary | ICD-10-CM | POA: Diagnosis not present

## 2020-12-14 LAB — COMPREHENSIVE METABOLIC PANEL
ALT: 30 U/L (ref 0–44)
AST: 35 U/L (ref 15–41)
Albumin: 3.1 g/dL — ABNORMAL LOW (ref 3.5–5.0)
Alkaline Phosphatase: 131 U/L — ABNORMAL HIGH (ref 38–126)
Anion gap: 7 (ref 5–15)
BUN: 26 mg/dL — ABNORMAL HIGH (ref 8–23)
CO2: 25 mmol/L (ref 22–32)
Calcium: 8.7 mg/dL — ABNORMAL LOW (ref 8.9–10.3)
Chloride: 109 mmol/L (ref 98–111)
Creatinine, Ser: 1.63 mg/dL — ABNORMAL HIGH (ref 0.61–1.24)
GFR, Estimated: 46 mL/min — ABNORMAL LOW (ref >60)
Glucose, Bld: 85 mg/dL (ref 70–99)
Potassium: 4.7 mmol/L (ref 3.5–5.1)
Sodium: 141 mmol/L (ref 135–145)
Total Bilirubin: 0.3 mg/dL (ref 0.3–1.2)
Total Protein: 6.9 g/dL (ref 6.5–8.1)

## 2020-12-14 LAB — TSH: TSH: 2.587 u[IU]/mL (ref 0.350–4.500)

## 2020-12-14 LAB — LIPID PANEL
Cholesterol: 163 mg/dL (ref 0–200)
HDL: 58 mg/dL (ref >40)
LDL Cholesterol: 96 mg/dL (ref 0–99)
Total CHOL/HDL Ratio: 2.8 RATIO
Triglycerides: 44 mg/dL (ref <150)
VLDL: 9 mg/dL (ref 0–40)

## 2020-12-14 LAB — DIGOXIN LEVEL: Digoxin Level: 0.5 ng/mL — ABNORMAL LOW (ref 0.8–2.0)

## 2020-12-14 MED ORDER — AMIODARONE HCL 200 MG PO TABS: 200.0000 mg | ORAL_TABLET | Freq: Every day | ORAL | 1 refills | Status: DC

## 2020-12-14 MED ORDER — FUROSEMIDE 20 MG PO TABS: 20.0000 mg | ORAL_TABLET | Freq: Every day | ORAL | 11 refills | Status: DC

## 2020-12-14 MED ORDER — ENTRESTO 24-26 MG PO TABS: 1.0000 | ORAL_TABLET | Freq: Two times a day (BID) | ORAL | 3 refills | Status: DC

## 2020-12-14 MED ORDER — AMIODARONE HCL 200 MG PO TABS: 200.0000 mg | ORAL_TABLET | Freq: Every day | ORAL | 3 refills | Status: DC

## 2020-12-14 NOTE — Patient Instructions (Addendum)
Labs done today. We will contact you only if your labs are abnormal.  START Entresto 24-26mg  (1 tablet) by mouth 2 times daily.  START Lasix 20mg  (1 tablet) by mouth daily.  DECREASE Amiodarone to 200mg  (1 tablet) by mouth daily.   No other medication changes were made. Please continue all current medications as prescribed.  You have been referred to our paramedicine program. We will be in contact with you about setting up home visits.  Your physician recommends that you schedule a follow-up appointment in: 10 days for a lab only appointment and in 3 weeks with our APP Clinic here in our office.  If you have any questions or concerns before your next appointment please send [place] a message through [place] or call our office at [phone].    TO LEAVE A MESSAGE FOR THE NURSE SELECT OPTION 2, PLEASE LEAVE A MESSAGE INCLUDING: . YOUR NAME . DATE OF BIRTH . CALL BACK NUMBER . REASON FOR CALL**this is important as we prioritize the call backs  YOU WILL RECEIVE A CALL BACK THE SAME DAY AS LONG AS YOU CALL BEFORE 4:00 PM   Do the following things EVERYDAY: 1) Weigh yourself in the morning before breakfast. Write it down and keep it in a log. 2) Take your medicines as prescribed 3) Eat low salt foods--Limit salt (sodium) to 2000 mg per day.  4) Stay as active as you can everyday 5) Limit all fluids for the day to less than 2 liters   At the [facility], you and your health needs are our priority. As part of our continuing mission to provide you with exceptional heart care, we have created designated Provider Care Teams. These Care Teams include your primary Cardiologist (physician) and Advanced Practice Providers (APPs- Physician Assistants and Nurse Practitioners) who all work together to provide you with the care you need, when you need it.   You may see any of the following providers on your designated Care Team at your next follow up: Marland Kitchen Dr Glori Bickers . Dr  Loralie Champagne . Darrick Grinder, NP . Lyda Jester, PA . [name], PharmD   Please be sure to bring in all your medications bottles to every appointment.

## 2020-12-14 NOTE — Progress Notes (Signed)
Paramedicine Initial Assessment:  Housing:  In what kind of housing do you live? House/apt/trailer/shelter? apartment  Do you rent/pay a mortgage/own? rent  Do you live with anyone? By himself  Are you currently worried about losing your housing? Yes- was supposed to go to court regarding eviction and past due rent but ended up in the hospital.  Owes $3,500 in back payments.  Rent is only $265/month but pt has been spending money on other things- wanting to get money taken directly out of his SSDI check to avoid this happening in the future.  States that he is talking with his landlord but that she is wanting him to pay the full outstanding amount to allow him to remain living there.   CSW informed pt he needs to go to [facility] with his eviction notice to inquire about emergency assistance.  Pt will plan to go after his visit today.  Within the past 12 months have you ever stayed outside, in a car, tent, a shelter, or temporarily with someone? no  Within the past 12 months have you been unable to get utilities when it was really needed?no- [place] doesn't get separate bills for utilities so never a concern.  Social:  What is your current marital status? separated  Do you have any children? daughter  Do you have family or friends who live locally? Daughter, brother, two sisters- reports daughter is his main source of suppor  Food:  Within the past 12 months were you ever worried that food would run out before you got money to buy more? sometimes  Within the past 17months have you run out of food and didn't have money to buy more? Sometimes  Gets food stamps but only $35/month- aware of local food pantries and able to get more food when he needs  Income:  What is your current source of income? SSDI $913/month  How hard is it for you to pay for the basics like food housing, medical care, and utilities? hard  Do you have outstanding medical bills? no  Insurance:  Are you  currently insured? [employer] Medicare and Medicaid  Do you have prescription coverage? yes  If no insurance, have you applied for coverage (Medicaid, disability, marketplace etc)? n/a  Transportation:  Do you have transportation to your medical appointments? Yes- uses the bus system but states he never has a problem getting where he needs to  In the past 12 months has lack of transportation kept you from medical appts or from getting medications? no  In the past 12 months has lack of transportation kept you from meetings, work, or getting things you needed? no   Daily Health Needs: Do you have a working scale at home? No- CSW provided pt with one during clinic visit.  How do you manage your medications at home? Used to bubble packs from [facility] but after hospital stay was given bottles so currently taking out of the bottle  Do you ever take your medications differently than prescribed? no  Do you have issues affording your medications? Reports that copays are about $37 a month which is sometimes unmanageable   Do you have any concerns with mobility at home? Sometimes struggles to make food cause he gets dizzy but that is his only concern  Do you use any assistive devices at home or have PCS at home? Cane and a walker  Do you have a PCP? Yes- Sadia Aslam  Do you have any trouble reading or writing? no  Are there any additional barriers you see to getting the care you need? None reported  CSW will continue to follow through paramedicine program and assist as needed.  [name], LCSW Clinical Social Worker Advanced Heart Failure Clinic Desk#: [phone] Cell#: [phone]

## 2020-12-14 NOTE — Progress Notes (Signed)
Advanced Heart Failure Clinic Note   Primary Care: Harvie Heck, MD Primary Cardiologist: Dr. Aundra Dubin   HPI: Edward Mullen is a 65 y.o. male with history of HTN, DM, chronic systolic CHF, and polysubstance abuse.   Admitted 08/23/16 with acute systolic CHF in setting of substance abuse. Echo was done, showing EF 15-20%.  Diuresed with IV lasix and meds adjusted as tolerated.  UDS + for cocaine on admission. Cardiac cath showed coronary disease but not significant enough to explain cardiomyopathy. Down 23 lbs from admission weight with diuresis.  Discharge weight 193 lbs.  He was positive for cocaine again in 2/18.    He was admitted with syncope in 5/18 after using cocaine.  He was noted to have NSVT on telemetry.  Syncope suspected due to VT, no ICD given active substance abuse but had Lifevest placed. EF 50% on 8/18 echo, so Lifevest subsequently removed.  He started using cocaine again and stopped his cardiac medications. In 4/22, he was admitted with acute on chronic systolic CHF.  Frequent NSVT was noted and amiodarone was started.  He had echo showing EF 20-25% with normal RV.  LHC showed 80% PLV stenosis, managed medically.   He presents today for followup of CHF.  He says that he is not using any cocaine.  He still smokes 1-2 cigarettes/day.  No ETOH.  Walks with cane.  Short of breath walking up stairs or walking about 100 yards.  No chest pain.  No lightheadedness.  No palpitations.   ECG (personally reviewed, 4/22): NSR, LBBB 148 msec.   Labs (1/18): Digoxin 0.4, BNP 961 Labs (2/18): K 4, creatinine 1.38 Labs (3/18): K 4.4, creatinine 1.85, digoxin 0.4 Labs (5/18): hgb 12.6, K 4.3, creatinine 1.3, digoxin 0.5 Labs (6/18): TSH normal, K 4.4, creatinine 2.04, AST 42, ALT 37 Labs (8/18): K 4.8, creatinine 1.65 Labs (11/18): K 4.1, creatinine 1.77 Labs (10/20): K 3.8, creatinine 1.33, hgb 14.9 Labs (4/22): digoxin 0.5, K 4.4, creatinine 1.5  ROS: All systems reviewed and  negative except as per HPI.   Social History: Lives in [place] with daughter. Prior heavy ETOH, now quit.  Quit smoking 10/20. Has quit using cocaine.  Active marijuana.   Family History  Problem Relation Age of Onset  . Stroke Mother 24  . Heart disease Father 45  . Colon cancer Neg Hx   . Colon polyps Neg Hx   . Esophageal cancer Neg Hx   . Stomach cancer Neg Hx   . Rectal cancer Neg Hx    Past Medical History: 1. HTN  2. Type II diabetes 3. Cocaine abuse 4. Active smoking, suspect COPD.  5. Cardiomyopathy: Nonischemic.  - Echo (1/18) with EF 15-20%, moderate LVH, moderate AI, moderate to severe MR, normal RV size with mildly decreased systolic function, PASP 44 mmHg.  - LHC/RHC (1/18): 80% stenosis in PLV branch.  Mean RA 5, PA 47/20 mean 32, mean PCWP 22, CI 2.01.  - Echo (8/18): EF 50% with moderate LVH, diffuse hypokinesis, normal RV size and systolic function, mild AI, trivial MR.  - Echo (4/22): EF 20-25%, severe LV dilation, normal RV. 6. CAD: LHC (1/18) with 80% stenosis in a branch of the PLV (not enough CAD to explain cardiomyopathy).  - LHC (4/22): PLV 80%, 25% mLAD.  7. Mitral regurgitation: Moderate to severe on 1/18 echo, probably functional.  Trivial MR on 8/18 echo.  8. CKD: Stage 3.  9. Syncope: 5/18 in setting of cocaine use, concern for VT.  10. Lower extremity arterial dopplers (7/18): No significant PAD.  11. Sciatica 12. NSVT 13. CKD stage 3   Current Outpatient Medications  Medication Sig Dispense Refill  . albuterol (VENTOLIN HFA) 108 (90 Base) MCG/ACT inhaler Inhale 1-2 puffs into the lungs every 6 (six) hours as needed for wheezing or shortness of breath. 8.5 g 1  . Blood Pressure Monitor DEVI Please use to monitor blood pressure as directed.  ICD: I10 (hypertension) 1 each 0  . digoxin (LANOXIN) 0.125 MG tablet Take 1 tablet (0.125 mg total) by mouth daily. 30 tablet 1  . empagliflozin (JARDIANCE) 10 MG TABS tablet Take 1 tablet (10 mg total) by  mouth daily. 30 tablet 1  . eplerenone (INSPRA) 25 MG tablet Take 1 tablet (25 mg total) by mouth daily. 30 tablet 1  . furosemide (LASIX) 20 MG tablet Take 1 tablet (20 mg total) by mouth daily. 30 tablet 11  . rosuvastatin (CRESTOR) 5 MG tablet TAKE 1 TABLET (5 MG TOTAL) BY MOUTH DAILY. 30 tablet 5  . sacubitril-valsartan (ENTRESTO) 24-26 MG Take 1 tablet by mouth 2 (two) times daily. 180 tablet 3  . tamsulosin (FLOMAX) 0.4 MG CAPS capsule Take 1 capsule (0.4 mg total) by mouth daily after supper. 30 capsule 2  . amiodarone (PACERONE) 200 MG tablet Take 1 tablet (200 mg total) by mouth daily. 90 tablet 3   No current facility-administered medications for this encounter.    No Known Allergies    Vitals:   12/14/20 0847  BP: 110/64  Pulse: 68  SpO2: 100%  Weight: 88.3 kg (194 lb 9.6 oz)   Wt Readings from Last 3 Encounters:  12/14/20 88.3 kg (194 lb 9.6 oz)  12/05/20 80 kg (176 lb 5.9 oz)  08/03/20 95.1 kg (209 lb 9.6 oz)     PHYSICAL EXAM: General: NAD Neck: JVP 8-9 cm, no thyromegaly or thyroid nodule.  Lungs: Rhonchi right base.  CV: Nondisplaced PMI.  Heart regular S1/S2, no S3/S4, no murmur.  1+ edema 1/3 up lower legs.  No carotid bruit.  Normal pedal pulses.  Abdomen: Soft, nontender, no hepatosplenomegaly, no distention.  Skin: Intact without lesions or rashes.  Neurologic: Alert and oriented x 3.  Psych: Normal affect. Extremities: No clubbing or cyanosis.  HEENT: Normal.   ASSESSMENT & PLAN:  1. Chronic systolic CHF: EF 36-64% with moderate to severe MR on echo 1/18. Nonischemic cardiomyopathy. HTN vs cocaine use vs viral vs ETOH. HIV negative, SPEP with no M-spike. Patient does have CAD but not enough to explain cardiomyopathy (last cath in 4/22 with 80% PLV stenosis).  Echo in 8/18 showed EF up to 50%. However, he stopped his meds and started cocaine again, echo in 4/22 with EF back down to 20-25% => suspect cocaine abuse may be the primary etiology of  cardiomyopathy.  He is now back on his meds and off cocaine.  He has a LBBB.  NYHA class II-III symptoms, mild volume overload.  - Start Lasix 20 mg daily. BMET today and in 10 days.  - Continue eplerenone 25 mg daily (breast tenderness with spironolactone).    - Continue digoxin, check level today.  - Continue empagliflozin 10 mg daily.  - Start Entresto 24/26 bid.  - I urged him to stay off cocaine.  - He has a wide LBBB, would like benefit from CRT if EF remains low.  However, EF has improved in the past off cocaine. Would repeat echo after med titration and off cocaine.  2.  NSVT: Runs during last hospitalization.  He is now on amiodarone.  - Decrease amiodarone to 200 mg daily.  Check LFTs/TSH.  Needs regular eye exam.  3. Cocaine abuse: He has quit, congratulated him.  4. CKD stage 3: BMET today.  5. Smoking: Needs to quit completely.  6. CAD: 80% stenosis PLV on cath in 4/22.  No ischemic chest pain.   - Continue ASA 81 daily and Crestor 5 mg daily.   - Check lipids.   Followup 3 wks with APP to reassess volume.   Loralie Champagne, MD 12/14/20

## 2020-12-15 ENCOUNTER — Encounter: Payer: Self-pay | Admitting: Student

## 2020-12-15 ENCOUNTER — Ambulatory Visit (INDEPENDENT_AMBULATORY_CARE_PROVIDER_SITE_OTHER): Payer: Medicare (Managed Care) | Admitting: Student

## 2020-12-15 VITALS — BP 116/75 | HR 74 | Temp 98.0°F | Ht 72.0 in | Wt 193.6 lb

## 2020-12-15 DIAGNOSIS — I5022 Chronic systolic (congestive) heart failure: Secondary | ICD-10-CM

## 2020-12-15 DIAGNOSIS — F17210 Nicotine dependence, cigarettes, uncomplicated: Secondary | ICD-10-CM

## 2020-12-15 DIAGNOSIS — F1910 Other psychoactive substance abuse, uncomplicated: Secondary | ICD-10-CM | POA: Diagnosis not present

## 2020-12-15 DIAGNOSIS — Z720 Tobacco use: Secondary | ICD-10-CM

## 2020-12-15 DIAGNOSIS — I110 Hypertensive heart disease with heart failure: Secondary | ICD-10-CM | POA: Diagnosis not present

## 2020-12-15 DIAGNOSIS — I10 Essential (primary) hypertension: Secondary | ICD-10-CM

## 2020-12-15 DIAGNOSIS — Z599 Problem related to housing and economic circumstances, unspecified: Secondary | ICD-10-CM

## 2020-12-15 NOTE — Patient Instructions (Signed)
Thank you, [name] for allowing [place] to provide your care today. Today we discussed  Heart failure Please continue to follow the instructions of Dr. Aundra Dubin, and continue to take all your heart failure medications.  Please keep up the good work I know this is a difficult time but please try to avoid substances at all costs.  I will be putting in a referral to our case manager as well as reaching out to court on the case manager you saw while you are admitted to the hospital.  We will work together to try to help you with the available resources in the area.  History of Smoking I will be placing a referral for lung function tests that can be performed.  This will determine whether or not you have COPD or other lung disease.  I have ordered the following labs for you:  Lab Orders  No laboratory test(s) ordered today     Tests ordered today:  PFT's  Referrals ordered today:   Referral Orders  No referral(s) requested today     I have ordered the following medication/changed the following medications:   Stop the following medications: There are no discontinued medications.   Start the following medications: No orders of the defined types were placed in this encounter.    Follow up: 3 months    Remember: If you need use we are here to help you!   Should you have any questions or concerns please call the internal medicine clinic at [phone].     Edward Mullen, D.O. [place]

## 2020-12-15 NOTE — Assessment & Plan Note (Signed)
Assessment: Patiently recently admitted to the hospital for acute on chronic heart failure exacerbation.  This was thought to be secondary to his substance use of crack cocaine.  Patient has since followed up with Dr. Aundra Dubin of heart failure team.  Patient states he is doing well denies any episodes of shortness of breath or lower extremity swelling.  Saw Dr. Aundra Dubin 2 days ago and was restarted back on Entresto and Lasix daily.  Overall discuss cessation from substances now he is able to do this heart failure symptoms should improve.  Plan: -Continue digoxin 0.125 mg, Jardiance 10 mg, eplerenone 25 mg, Entresto 24-26 twice daily, Lasix 20 mg daily -Need to follow-up with heart failure team appreciate Dr. Claris Gladden assistance

## 2020-12-15 NOTE — Progress Notes (Signed)
CC: Heart failure, substance use  HPI:  Edward Mullen is a 65 y.o. male with a past medical history stated below and presents today for posthospitalization of acute on chronic heart failure secondary to substance use. Please see problem based assessment and plan for additional details.  Past Medical History:  Diagnosis Date  . Arthritis    "feels like it in my legs" (09/20/2014)  . Borderline type 2 diabetes mellitus    patient denies  . CHF (congestive heart failure) ([place])   . Dysrhythmia   . Hypertension   . NSVT (nonsustained ventricular tachycardia) ([place]) 09/06/2016    Current Outpatient Medications on File Prior to Visit  Medication Sig Dispense Refill  . albuterol (VENTOLIN HFA) 108 (90 Base) MCG/ACT inhaler Inhale 1-2 puffs into the lungs every 6 (six) hours as needed for wheezing or shortness of breath. 8.5 g 1  . amiodarone (PACERONE) 200 MG tablet Take 1 tablet (200 mg total) by mouth daily. 90 tablet 3  . Blood Pressure Monitor DEVI Please use to monitor blood pressure as directed.  ICD: I10 (hypertension) 1 each 0  . digoxin (LANOXIN) 0.125 MG tablet Take 1 tablet (0.125 mg total) by mouth daily. 30 tablet 1  . empagliflozin (JARDIANCE) 10 MG TABS tablet Take 1 tablet (10 mg total) by mouth daily. 30 tablet 1  . eplerenone (INSPRA) 25 MG tablet Take 1 tablet (25 mg total) by mouth daily. 30 tablet 1  . furosemide (LASIX) 20 MG tablet Take 1 tablet (20 mg total) by mouth daily. 30 tablet 11  . rosuvastatin (CRESTOR) 5 MG tablet TAKE 1 TABLET (5 MG TOTAL) BY MOUTH DAILY. 30 tablet 5  . sacubitril-valsartan (ENTRESTO) 24-26 MG Take 1 tablet by mouth 2 (two) times daily. 180 tablet 3  . tamsulosin (FLOMAX) 0.4 MG CAPS capsule Take 1 capsule (0.4 mg total) by mouth daily after supper. 30 capsule 2   No current facility-administered medications on file prior to visit.    Family History  Problem Relation Age of Onset  . Stroke Mother 21  . Heart disease Father 41  .  Colon cancer Neg Hx   . Colon polyps Neg Hx   . Esophageal cancer Neg Hx   . Stomach cancer Neg Hx   . Rectal cancer Neg Hx     Social History   Socioeconomic History  . Marital status: Married    Spouse name: Not on file  . Number of children: Not on file  . Years of education: Not on file  . Highest education level: Not on file  Occupational History  . Occupation: Engineer, manufacturing systems    Comment: has not been able to work steadily for the last year or two  Tobacco Use  . Smoking status: Current Every Day Smoker    Packs/day: 0.10    Years: 48.00    Pack years: 4.80    Types: Cigarettes  . Smokeless tobacco: Never Used  . Tobacco comment: cutting back 2  per day  Vaping Use  . Vaping Use: Never used  Substance and Sexual Activity  . Alcohol use: Yes    Alcohol/week: 1.0 standard drink    Types: 1 Cans of beer per week    Comment: occasionally  . Drug use: Yes    Types: Marijuana    Comment: daily 4 times last 2 weeks ago as of 05/31/2019  . Sexual activity: Yes    Birth control/protection: None  Other Topics Concern  . Not on  file  Social History Narrative  . Not on file   Social Determinants of Health   Financial Resource Strain: High Risk  . Difficulty of Paying Living Expenses: Hard  Food Insecurity: Food Insecurity Present  . Worried About Charity fundraiser in the Last Year: Sometimes true  . Ran Out of Food in the Last Year: Sometimes true  Transportation Needs: No Transportation Needs  . Lack of Transportation (Medical): No  . Lack of Transportation (Non-Medical): No  Physical Activity: Not on file  Stress: Not on file  Social Connections: Not on file  Intimate Partner Violence: Not on file    Review of Systems: ROS negative except for what is noted on the assessment and plan.  Vitals:   12/15/20 1010  BP: 116/75  Pulse: 74  Temp: 98 F (36.7 C)  TempSrc: Oral  SpO2: 100%  Weight: 193 lb 9.6 oz (87.8 kg)  Height: 6' (1.829 m)     Physical  Exam: Constitutional: Well-appearing, sitting in chair with no acute distress. HENT: normocephalic atraumatic Eyes: conjunctiva non-erythematous Neck: supple Cardiovascular: regular rate and rhythm, no m/r/g.  No lower extremity edema present Pulmonary/Chest: normal work of breathing on room air Abdominal: soft, non-tender, non-distended MSK: normal bulk and tone Neurological: alert & oriented x 3 Skin: warm and dry Psych: Normal mood and thought process   Assessment & Plan:   See Encounters Tab for problem based charting.  Patient discussed with Dr. Newell Coral, D.O. [facility], PGY-1 Pager: [phone], Phone: [phone] Date 12/15/2020 Time 1:41 PM

## 2020-12-15 NOTE — Assessment & Plan Note (Signed)
Assessment: Blood pressure 116/75, continue on current blood pressure medication regimen of Entresto, Lasix.    Plan: Edward Mullen 24-26 twice daily, Lasix 20 mg daily

## 2020-12-15 NOTE — Assessment & Plan Note (Signed)
Assessment: Patient with history of cocaine use as well as alcohol use.  States he has been sober since being discharged from the hospital.  States that stressors would cause him to use the substances.  States that he is motivated since being discharged from the hospital and knows that if he uses again he is at risk for worsening of heart failure.   Plan: -Continue to work with case management services

## 2020-12-15 NOTE — Assessment & Plan Note (Signed)
Assessment: Patient giving smoking cessation.  Found to have hyperinflated lungs on chest x-ray, and with history of smoking consistent with suspected COPD.  PFTs were ordered.  Patient given smoking cessation during this visit   Plan: -Smoking cessation given -PFTs ordered

## 2020-12-18 ENCOUNTER — Telehealth (HOSPITAL_COMMUNITY): Payer: Self-pay

## 2020-12-18 NOTE — Addendum Note (Signed)
Addended by: Riesa Pope on: 12/18/2020 07:03 AM   Modules accepted: Orders

## 2020-12-18 NOTE — Telephone Encounter (Signed)
I was able to reach Mr. Haughey reference referral to paramedicine and was able to make appointment for this Thursday for home visit.   Marylouise Stacks, EMT-Paramedic  12/18/20

## 2020-12-19 ENCOUNTER — Telehealth: Payer: Self-pay | Admitting: *Deleted

## 2020-12-19 NOTE — Chronic Care Management (AMB) (Signed)
  Care Management   Note  12/19/2020 Name: Edward Mullen MRN: [id number] DOB: 26-Sep-1955  Edward Mullen is a 65 y.o. year old male who is a primary care patient of Aslam, Loralyn Freshwater, MD. I reached out to [name] by phone today in response to a referral sent by Edward Mullen's PCP, Harvie Heck, MD.    Edward Mullen was given information about care management services today including:  1. Care management services include personalized support from designated clinical staff supervised by his physician, including individualized plan of care and coordination with other care providers 2. 24/7 contact phone numbers for assistance for urgent and routine care needs. 3. The patient may stop care management services at any time by phone call to the office staff.  Patient agreed to services and verbal consent obtained.   Follow up plan: Telephone appointment with care management team member scheduled for:12/21/2020  [place]  Direct Dial: [phone]

## 2020-12-20 NOTE — Progress Notes (Signed)
Internal Medicine Clinic Attending  Case discussed with Dr. Katsadouros  At the time of the visit.  We reviewed the resident's history and exam and pertinent patient test results.  I agree with the assessment, diagnosis, and plan of care documented in the resident's note.  

## 2020-12-21 ENCOUNTER — Ambulatory Visit: Payer: Medicare (Managed Care) | Admitting: Licensed Clinical Social Worker

## 2020-12-21 ENCOUNTER — Other Ambulatory Visit (HOSPITAL_COMMUNITY): Payer: Self-pay

## 2020-12-21 ENCOUNTER — Ambulatory Visit: Payer: Self-pay | Admitting: *Deleted

## 2020-12-21 ENCOUNTER — Other Ambulatory Visit (HOSPITAL_COMMUNITY): Payer: Self-pay | Admitting: *Deleted

## 2020-12-21 ENCOUNTER — Other Ambulatory Visit: Payer: Self-pay

## 2020-12-21 NOTE — Chronic Care Management (AMB) (Addendum)
  Care Management   Social Work Visit Note  12/21/2020 Name: Edward Mullen MRN: [id number] DOB: 08/20/1955  Edward Mullen is a 65 y.o. year old male who sees Aslam, Loralyn Freshwater, MD for primary care. The care management team was consulted for assistance with care management and care coordination needs related to Financial Difficulties related to Housing   Patient was given the following information about care management and care coordination services today, agreed to services, and gave verbal consent: 1.care management/care coordination services include personalized support from designated clinical staff supervised by their physician, including individualized plan of care and coordination with other care providers 2. 24/7 contact phone numbers for assistance for urgent and routine care needs. 3. The patient may stop care management/care coordination services at any time by phone call to the office staff.  Engaged with patient by telephone for initial visit in response to provider referral for social work chronic care management and care coordination services.  Assessment: Review of patient history, allergies, and health status during evaluation of patient need for care management/care coordination services.    Interventions:  . Patient interviewed and appropriate assessments performed . Collaborated with clinical team regarding patient needs  . Collaborated with provider office/team re: Substance abuse/ Housing  Patient stated he owes over $4,000.00 in back rent. Patient stated he had no additional needs or concerns.  . SW collaborated with [name], LCSW, regarding any additional needs the patient may have.   SDOH (Social Determinants of Health) assessments performed: Yes     Plan:  . patient will work with BSW to address needs related to Housing barriers . Patient will contact [place] to seek emergency rental assistance. .  . Patient agreed to stay with daughter if  needed.   Milus Height, [place]  Social Worker [facility] Care Management  [phone]

## 2020-12-21 NOTE — Progress Notes (Signed)
Paramedicine Encounter    Patient ID: Edward Mullen, male    DOB: 06-08-1956, 65 y.o.   MRN: [id number]   Patient Care Team: Harvie Heck, MD as PCP - General Drema Pry as Social Worker  Patient Active Problem List   Diagnosis Date Noted  . Urinary retention   . Scrotal mass 08/01/2020  . Nocturia 08/01/2020  . Lumbar spinal stenosis 06/08/2019  . Healthcare maintenance 05/11/2019  . Housing problems 05/11/2019  . Difficulty urinating 04/07/2018  . Colon cancer screening 04/07/2018  . Anemia 12/19/2016  . CKD (chronic kidney disease) stage 3, GFR 30-59 ml/min (HCC) 12/13/2016  . Chronic systolic heart failure ([place]) 09/06/2016  . Non-ischemic cardiomyopathy ([place]) 08/27/2016  . Non-obstructive hypertrophic cardiomyopathy ([place]) 08/27/2016  . Mitral regurgitation 08/27/2016  . Polysubstance abuse ([place])   . Tobacco abuse 09/19/2014  . HTN (hypertension) 03/28/2011  . Bilateral lower extremity pain 03/28/2011    Current Outpatient Medications:  .  albuterol (VENTOLIN HFA) 108 (90 Base) MCG/ACT inhaler, Inhale 1-2 puffs into the lungs every 6 (six) hours as needed for wheezing or shortness of breath., Disp: 8.5 g, Rfl: 1 .  amiodarone (PACERONE) 200 MG tablet, Take 1 tablet (200 mg total) by mouth daily., Disp: 90 tablet, Rfl: 3 .  Blood Pressure Monitor DEVI, Please use to monitor blood pressure as directed.  ICD: I10 (hypertension), Disp: 1 each, Rfl: 0 .  digoxin (LANOXIN) 0.125 MG tablet, Take 1 tablet (0.125 mg total) by mouth daily., Disp: 30 tablet, Rfl: 1 .  empagliflozin (JARDIANCE) 10 MG TABS tablet, Take 1 tablet (10 mg total) by mouth daily., Disp: 30 tablet, Rfl: 1 .  eplerenone (INSPRA) 25 MG tablet, Take 1 tablet (25 mg total) by mouth daily., Disp: 30 tablet, Rfl: 1 .  sacubitril-valsartan (ENTRESTO) 24-26 MG, Take 1 tablet by mouth 2 (two) times daily., Disp: 180 tablet, Rfl: 3 .  tamsulosin (FLOMAX) 0.4 MG CAPS capsule, Take 1 capsule (0.4 mg total) by mouth  daily after supper., Disp: 30 capsule, Rfl: 2 .  furosemide (LASIX) 20 MG tablet, Take 1 tablet (20 mg total) by mouth daily. (Patient not taking: Reported on 12/21/2020), Disp: 30 tablet, Rfl: 11 .  rosuvastatin (CRESTOR) 5 MG tablet, TAKE 1 TABLET (5 MG TOTAL) BY MOUTH DAILY. (Patient not taking: Reported on 12/21/2020), Disp: 30 tablet, Rfl: 5 No Known Allergies    Social History   Socioeconomic History  . Marital status: Married    Spouse name: Not on file  . Number of children: Not on file  . Years of education: Not on file  . Highest education level: Not on file  Occupational History  . Occupation: Engineer, manufacturing systems    Comment: has not been able to work steadily for the last year or two  Tobacco Use  . Smoking status: Current Every Day Smoker    Packs/day: 0.10    Years: 48.00    Pack years: 4.80    Types: Cigarettes  . Smokeless tobacco: Never Used  . Tobacco comment: cutting back 2  per day  Vaping Use  . Vaping Use: Never used  Substance and Sexual Activity  . Alcohol use: Yes    Alcohol/week: 1.0 standard drink    Types: 1 Cans of beer per week    Comment: occasionally  . Drug use: Yes    Types: Marijuana    Comment: daily 4 times last 2 weeks ago as of 05/31/2019  . Sexual activity: Yes    Birth  control/protection: None  Other Topics Concern  . Not on file  Social History Narrative  . Not on file   Social Determinants of Health   Financial Resource Strain: High Risk  . Difficulty of Paying Living Expenses: Hard  Food Insecurity: Food Insecurity Present  . Worried About Charity fundraiser in the Last Year: Sometimes true  . Ran Out of Food in the Last Year: Sometimes true  Transportation Needs: No Transportation Needs  . Lack of Transportation (Medical): No  . Lack of Transportation (Non-Medical): No  Physical Activity: Not on file  Stress: Not on file  Social Connections: Not on file  Intimate Partner Violence: Not on file    Physical  Exam      Future Appointments  Date Time Provider [place]  12/21/2020  1:00 PM Drema Pry [facility]  12/27/2020 10:15 AM [facility] LAB [facility] None  01/11/2021  8:30 AM [facility] PA/NP [facility] None    BP 106/74   Pulse 70   Resp 18   SpO2 97%   Weight yesterday-194 Last visit weight-194  First visit with pt in the home, he lives alone. He met me off elevator and walked down long hall to his apt without difficulty. No sob, he said sometimes he does get sob but not often. Uses a cane.  He uses bus transportation for appointments.  He denies any dizziness. He feels maybe twice a week he gets c/p, he feels like it is food related with his taste for spicy foods.  He does get food stamps. He denies any issues obtaining foods.  He uses [facility]. They deliver for him.  He used to get bubble packs and wants to get those again once his regimen more stable.  Upon med review- He is out of his albuterol.  He has PCP phone visit today-advised him to tell his provider he is out of that so they can refill.  -he does not have the lasix-- -he does not have the rosuvastatin- Contacted pharmacy and they do have the lasix.  He tries to stay away from sodium-does not add extra salt to foods.  resch those labs due to him not getting the lasix yet.  Weight at [phone] Reminded him 3lbs overnight or 5lbs within a week to call us.  Reviewed fluid intake as well.  Will see him next week.    Marylouise Stacks, [place] [facility] Paramedic  12/21/20

## 2020-12-21 NOTE — Patient Instructions (Signed)
Visit Information  Instructions: patient will work with SW to address concerns related to Housing  Patient was given the following information about care management and care coordination services today, agreed to services, and gave verbal consent: 1.care management/care coordination services include personalized support from designated clinical staff supervised by their physician, including individualized plan of care and coordination with other care providers 2. 24/7 contact phone numbers for assistance for urgent and routine care needs. 3. The patient may stop care management/care coordination services at any time by phone call to the office staff.  Patient verbalizes understanding of instructions provided today and agrees to view in [place].   Telephone follow up appointment with care management team member scheduled for: [place], [place] Worker [facility] Care Management  [phone]

## 2020-12-21 NOTE — Chronic Care Management (AMB) (Signed)
Care Management    RN Visit Note  12/21/2020 Name: Edward Mullen MRN: [id number] DOB: 07-21-1956  Subjective: Edward Mullen is a 65 y.o. year old male who is a primary care patient of Aslam, Loralyn Freshwater, MD. The care management team was consulted for assistance with disease management and care coordination needs.    Engaged with patient by telephone in collaboration with BSW for initial visit in response to provider referral for case management and/or care coordination services.   Consent to Services:   Mr. Marxen was given information about Care Management services today including:  1. Care Management services includes personalized support from designated clinical staff supervised by his physician, including individualized plan of care and coordination with other care providers 2. 24/7 contact phone numbers for assistance for urgent and routine care needs. 3. The patient may stop case management services at any time by phone call to the office staff.  Patient agreed to services and consent obtained.   Assessment: Review of patient past medical history, allergies, medications, health status, including review of consultants reports, laboratory and other test data, was performed as part of comprehensive evaluation and provision of chronic care management services.   SDOH (Social Determinants of Health) assessments and interventions performed:  see social work note  Care Plan  No Known Allergies  Outpatient Encounter Medications as of 12/21/2020  Medication Sig  . albuterol (VENTOLIN HFA) 108 (90 Base) MCG/ACT inhaler Inhale 1-2 puffs into the lungs every 6 (six) hours as needed for wheezing or shortness of breath.  Marland Kitchen amiodarone (PACERONE) 200 MG tablet Take 1 tablet (200 mg total) by mouth daily.  . Blood Pressure Monitor DEVI Please use to monitor blood pressure as directed.  ICD: I10 (hypertension)  . digoxin (LANOXIN) 0.125 MG tablet Take 1 tablet (0.125 mg total) by mouth  daily.  . empagliflozin (JARDIANCE) 10 MG TABS tablet Take 1 tablet (10 mg total) by mouth daily.  Marland Kitchen eplerenone (INSPRA) 25 MG tablet Take 1 tablet (25 mg total) by mouth daily.  . furosemide (LASIX) 20 MG tablet Take 1 tablet (20 mg total) by mouth daily. (Patient not taking: Reported on 12/21/2020)  . rosuvastatin (CRESTOR) 5 MG tablet TAKE 1 TABLET (5 MG TOTAL) BY MOUTH DAILY. (Patient not taking: Reported on 12/21/2020)  . sacubitril-valsartan (ENTRESTO) 24-26 MG Take 1 tablet by mouth 2 (two) times daily.  . tamsulosin (FLOMAX) 0.4 MG CAPS capsule Take 1 capsule (0.4 mg total) by mouth daily after supper.   No facility-administered encounter medications on file as of 12/21/2020.    Patient Active Problem List   Diagnosis Date Noted  . Urinary retention   . Scrotal mass 08/01/2020  . Nocturia 08/01/2020  . Lumbar spinal stenosis 06/08/2019  . Healthcare maintenance 05/11/2019  . Housing problems 05/11/2019  . Difficulty urinating 04/07/2018  . Colon cancer screening 04/07/2018  . Anemia 12/19/2016  . CKD (chronic kidney disease) stage 3, GFR 30-59 ml/min (HCC) 12/13/2016  . Chronic systolic heart failure ([place]) 09/06/2016  . Non-ischemic cardiomyopathy ([place]) 08/27/2016  . Non-obstructive hypertrophic cardiomyopathy ([place]) 08/27/2016  . Mitral regurgitation 08/27/2016  . Polysubstance abuse ([place])   . Tobacco abuse 09/19/2014  . HTN (hypertension) 03/28/2011  . Bilateral lower extremity pain 03/28/2011    Conditions to be addressed/monitored: CHF, HTN, CKD Stage III and Substance Misuse  Care Plan : Collaborative Case [place] Plan of care in patient with multiple comorbid conditions including HTN, CKD, CHF, and Substance Misuse  Updates made by Samul Dada,  Marvetta Gibbons, RN since 12/21/2020 12:00 AM    Problem: Knowledge Deficits related to independent self health management of chronic disease states including CHF, CKD, HTN, substance misuse   Priority: Medium    Long-Range Goal:  Improved Self Health Management of Chronic Health Conditions and Substance Misuse   Start Date: 12/21/2020  Expected End Date: 03/21/2021  This Visit's Progress: On track  Priority: Medium  Note:   Current Barriers:  . Chronic Disease Management support, education, and care coordination needs related to CHF, HTN, CKD Stage III, and Substance Misuse . Unable to independently self manage chronic health conditions Case Manager Clinical Goal(s):  . patient will work with BSW to address needs related to Substance abuse issues -  substance misuse  in patient with CHF, HTN, CKD Stage III, and substance misuse Interventions:  . Collaborated with BSW to initiate plan of care to address needs related to Substance abuse issues -  substance misuse  in patient with CHF, HTN, CKD Stage III, and Substance Misuse . Collaboration with Harvie Heck, MD regarding development and update of comprehensive plan of care as evidenced by provider attestation and co-signature . Inter-disciplinary care team collaboration (see longitudinal plan of care) Patient Goals/Self-Care Activities .  Patient will work with BSW to address care coordination needs and will continue to work with the clinical team to address health care and disease management related needs.   Follow Up Plan: The patient has been provided with contact information for the care management team and has been advised to call with any health related questions or concerns.  The care management team will reach out to the patient again over the next 30 days.       Plan: The care management team will reach out to the patient again over the next 30 days.  [place] Management Coordinator Direct Dial:  [phone]  Fax: [phone]

## 2020-12-25 ENCOUNTER — Other Ambulatory Visit (HOSPITAL_COMMUNITY): Payer: Medicare (Managed Care)

## 2020-12-26 ENCOUNTER — Other Ambulatory Visit (HOSPITAL_COMMUNITY): Payer: Self-pay | Admitting: *Deleted

## 2020-12-26 ENCOUNTER — Other Ambulatory Visit (HOSPITAL_COMMUNITY): Payer: Self-pay

## 2020-12-26 MED ORDER — AMIODARONE HCL 200 MG PO TABS: 200.0000 mg | ORAL_TABLET | Freq: Every day | ORAL | 3 refills | Status: DC

## 2020-12-26 MED ORDER — EMPAGLIFLOZIN 10 MG PO TABS: 10.0000 mg | ORAL_TABLET | Freq: Every day | ORAL | 1 refills | Status: DC

## 2020-12-26 MED ORDER — EPLERENONE 25 MG PO TABS: 25.0000 mg | ORAL_TABLET | Freq: Every day | ORAL | 1 refills | Status: DC

## 2020-12-26 MED ORDER — FUROSEMIDE 20 MG PO TABS
20.0000 mg | ORAL_TABLET | Freq: Every day | ORAL | 11 refills | Status: DC
Start: 2020-12-26 — End: 2021-10-11

## 2020-12-26 MED ORDER — DIGOXIN 125 MCG PO TABS: 0.1250 mg | ORAL_TABLET | Freq: Every day | ORAL | 1 refills | Status: DC

## 2020-12-26 MED ORDER — ROSUVASTATIN CALCIUM 5 MG PO TABS: ORAL_TABLET | Freq: Every day | ORAL | 3 refills | Status: DC

## 2020-12-26 MED ORDER — ENTRESTO 24-26 MG PO TABS: 1.0000 | ORAL_TABLET | Freq: Two times a day (BID) | ORAL | 3 refills | Status: DC

## 2020-12-26 NOTE — Progress Notes (Signed)
Paramedicine Encounter    Patient ID: Edward Mullen, male    DOB: [birth year], 65 y.o.   MRN: [id number]   Patient Care Team: Harvie Heck, MD as PCP - General Drema Pry as Social Worker Barrington Ellison, RN as [place] Management  Patient Active Problem List   Diagnosis Date Noted  . Urinary retention   . Scrotal mass 08/01/2020  . Nocturia 08/01/2020  . Lumbar spinal stenosis 06/08/2019  . Healthcare maintenance 05/11/2019  . Housing problems 05/11/2019  . Difficulty urinating 04/07/2018  . Colon cancer screening 04/07/2018  . Anemia 12/19/2016  . CKD (chronic kidney disease) stage 3, GFR 30-59 ml/min (HCC) 12/13/2016  . Chronic systolic heart failure ([place]) 09/06/2016  . Non-ischemic cardiomyopathy ([place]) 08/27/2016  . Non-obstructive hypertrophic cardiomyopathy ([place]) 08/27/2016  . Mitral regurgitation 08/27/2016  . Polysubstance abuse ([place])   . Tobacco abuse 09/19/2014  . HTN (hypertension) 03/28/2011  . Bilateral lower extremity pain 03/28/2011    Current Outpatient Medications:  .  albuterol (VENTOLIN HFA) 108 (90 Base) MCG/ACT inhaler, Inhale 1-2 puffs into the lungs every 6 (six) hours as needed for wheezing or shortness of breath., Disp: 8.5 g, Rfl: 1 .  amiodarone (PACERONE) 200 MG tablet, Take 1 tablet (200 mg total) by mouth daily., Disp: 90 tablet, Rfl: 3 .  Blood Pressure Monitor DEVI, Please use to monitor blood pressure as directed.  ICD: I10 (hypertension), Disp: 1 each, Rfl: 0 .  digoxin (LANOXIN) 0.125 MG tablet, Take 1 tablet (0.125 mg total) by mouth daily., Disp: 30 tablet, Rfl: 1 .  empagliflozin (JARDIANCE) 10 MG TABS tablet, Take 1 tablet (10 mg total) by mouth daily., Disp: 30 tablet, Rfl: 1 .  eplerenone (INSPRA) 25 MG tablet, Take 1 tablet (25 mg total) by mouth daily., Disp: 30 tablet, Rfl: 1 .  furosemide (LASIX) 20 MG tablet, Take 1 tablet (20 mg total) by mouth daily., Disp: 30 tablet, Rfl: 11 .  rosuvastatin (CRESTOR)  5 MG tablet, TAKE 1 TABLET (5 MG TOTAL) BY MOUTH DAILY., Disp: 30 tablet, Rfl: 5 .  sacubitril-valsartan (ENTRESTO) 24-26 MG, Take 1 tablet by mouth 2 (two) times daily., Disp: 180 tablet, Rfl: 3 .  tamsulosin (FLOMAX) 0.4 MG CAPS capsule, Take 1 capsule (0.4 mg total) by mouth daily after supper., Disp: 30 capsule, Rfl: 2 No Known Allergies    Social History   Socioeconomic History  . Marital status: Married    Spouse name: Not on file  . Number of children: Not on file  . Years of education: Not on file  . Highest education level: Not on file  Occupational History  . Occupation: Engineer, manufacturing systems    Comment: has not been able to work steadily for the last year or two  Tobacco Use  . Smoking status: Current Every Day Smoker    Packs/day: 0.10    Years: 48.00    Pack years: 4.80    Types: Cigarettes  . Smokeless tobacco: Never Used  . Tobacco comment: cutting back 2  per day  Vaping Use  . Vaping Use: Never used  Substance and Sexual Activity  . Alcohol use: Yes    Alcohol/week: 1.0 standard drink    Types: 1 Cans of beer per week    Comment: occasionally  . Drug use: Yes    Types: Marijuana    Comment: daily 4 times last 2 weeks ago as of 05/31/2019  . Sexual activity: Yes    Birth control/protection: None  Other Topics Concern  . Not on file  Social History Narrative  . Not on file   Social Determinants of Health   Financial Resource Strain: High Risk  . Difficulty of Paying Living Expenses: Hard  Food Insecurity: No Food Insecurity  . Worried About Charity fundraiser in the Last Year: Never true  . Ran Out of Food in the Last Year: Never true  Transportation Needs: No Transportation Needs  . Lack of Transportation (Medical): No  . Lack of Transportation (Non-Medical): No  Physical Activity: Not on file  Stress: Not on file  Social Connections: Not on file  Intimate Partner Violence: Not on file    Physical Exam      Future Appointments  Date Time  Provider [place]  12/27/2020 10:15 AM [facility] LAB [facility] None  01/11/2021  8:30 AM [facility] PA/NP [facility] None  01/23/2021 10:30 AM INT-CCM SOCIAL WORK [facility]    BP 110/76   Pulse 64   Resp 18   Wt 189 lb (85.7 kg)   SpO2 98%   BMI 25.63 kg/m   Weight yesterday-189 Last visit weight-194  Pt reports he is doing good, he got the lasix, his weight is down to 189 now.  He feels like his breathing is doing better since the lasix.  He denies any c/p or dizziness.  Needs the tamsulosin, albuterol and the inspra refilled. Does not have the bp monitor that is listed in med list.  Sent message to PCP office regarding the albuterol and tamsulosin to get 90 days supply instead of 30-his insurance co-pay is same for 90 or 30 day worth.  Also sent over message to CHF clinic to send in refills for all his other meds for same.  He has lab work sch for [employer]. Reminded him of that appointment.  Will see him next week.   Marylouise Stacks, [facility] Paramedic  12/26/20

## 2020-12-27 ENCOUNTER — Other Ambulatory Visit: Payer: Self-pay

## 2020-12-27 ENCOUNTER — Ambulatory Visit (HOSPITAL_COMMUNITY)
Admission: RE | Admit: 2020-12-27 | Discharge: 2020-12-27 | Disposition: A | Discharge status: Home / Self Care | Payer: Medicare (Managed Care) | Source: Ambulatory Visit | Attending: Internal Medicine | Admitting: Internal Medicine

## 2020-12-27 DIAGNOSIS — I5022 Chronic systolic (congestive) heart failure: Secondary | ICD-10-CM | POA: Insufficient documentation

## 2020-12-27 LAB — BASIC METABOLIC PANEL
Anion gap: 6 (ref 5–15)
BUN: 20 mg/dL (ref 8–23)
CO2: 27 mmol/L (ref 22–32)
Calcium: 9.3 mg/dL (ref 8.9–10.3)
Chloride: 106 mmol/L (ref 98–111)
Creatinine, Ser: 1.74 mg/dL — ABNORMAL HIGH (ref 0.61–1.24)
GFR, Estimated: 43 mL/min — ABNORMAL LOW (ref >60)
Glucose, Bld: 77 mg/dL (ref 70–99)
Potassium: 5 mmol/L (ref 3.5–5.1)
Sodium: 139 mmol/L (ref 135–145)

## 2021-01-02 NOTE — Addendum Note (Signed)
Encounter addended by: Iline Oven, LCSWA on: 01/02/2021 2:21 PM  Actions taken: Clinical Note Signed

## 2021-01-02 NOTE — Progress Notes (Signed)
01/02/2021 2:05pm: CSW received a call from Mr. Aro who reported he is facing eviction and that he needs to have roughly $4000 by tomorrow and if not the sheriff is coming by on Thursday to padlock the apartment and he will be evicted. CSW asked Mr. Redwine if he had followed up for his HV outpatient appointment at the clinic and asked if he spoke with the social worker and he reported that he forgot. CSW sent an in-basket to the outpatient CSW regarding the patients situation and possible eviction and to please follow up with the patient at that appointment. CSW informed the patient that unfortunately calling the day before he needs money is late notice but will send another message to the outpatient social worker to make them aware of the situation but it is highly unlikely that the [facility] outpatient clinic would be able to provide support in that time or with that amount of money. Mr. Dom reported that DSS is not able to help with evictions anymore and cannot provide him with any financial support at this time. CSW discussed with the patient about what his landlord stated previously that if he was unable to pay at this time that he would be evicted that he could go back to the complex but he would have to pay the amount due before they would allow him back. Patient reported he will call tomorrow and follow up and CSW will reach out to outpatient CSW HV for support.  Joanann Mies, MSW, [place] [facility] Social Worker

## 2021-01-03 ENCOUNTER — Telehealth (HOSPITAL_COMMUNITY): Payer: Self-pay | Admitting: Licensed Clinical Social Worker

## 2021-01-03 NOTE — Telephone Encounter (Signed)
CSW informed by inpatient HF TOC CSW that pt had reached out to her to inform that he was being evicted.  CSW had spoken to pt regarding this previously and provided resources but pt owes too much in back payments ($4,000) and was unable to find resource to pay in full and landlord unwilling to stop eviction unless paid in full.  CSW called pt to check in.  He states his apartment is being locked up today or tomorrow.  He is planning on calling his daughter today to see if he can stay with her.  CSW sent pt information for [facility] and the coordinated reentry line to discuss shelter options if he is unable to stay with his daughter.  Also offered to help pt get to daughters house or to [facility] if need be as his daughters car is currently not working.  CSW will continue to follow and assist as needed  [name], [place] [facility] Desk#: [phone] Cell#: [phone]

## 2021-01-09 ENCOUNTER — Telehealth (HOSPITAL_COMMUNITY): Payer: Self-pay

## 2021-01-09 NOTE — Telephone Encounter (Signed)
Contacted pt to f/u on his eviction from last week.  He reports he has been staying with his daughter-she resides at [address].  She said it was ok for me to come there, he did report he is out of meds. He is suppose to call me when he gets back to her house b/c he doesn't know the names of the meds.  I plan to see him Thursday.   ---I just called the pharmacy and went down his list of meds and everything that could be filled is being filled and I will p/u tomor and take to him on Thursday.    Marylouise Stacks, EMT-Paramedic  01/09/21

## 2021-01-11 ENCOUNTER — Other Ambulatory Visit (HOSPITAL_COMMUNITY): Payer: Self-pay

## 2021-01-11 ENCOUNTER — Encounter (HOSPITAL_COMMUNITY): Payer: Medicare (Managed Care)

## 2021-01-11 NOTE — Progress Notes (Signed)
Paramedicine Encounter    Patient ID: Edward Mullen, male    DOB: 1955-08-25, 65 y.o.   MRN: [id number]   Patient Care Team: Harvie Heck, MD as PCP - General Drema Pry as Social Worker Barrington Ellison, RN as [place] Management  Patient Active Problem List   Diagnosis Date Noted  . Urinary retention   . Scrotal mass 08/01/2020  . Nocturia 08/01/2020  . Lumbar spinal stenosis 06/08/2019  . Healthcare maintenance 05/11/2019  . Housing problems 05/11/2019  . Difficulty urinating 04/07/2018  . Colon cancer screening 04/07/2018  . Anemia 12/19/2016  . CKD (chronic kidney disease) stage 3, GFR 30-59 ml/min (HCC) 12/13/2016  . Chronic systolic heart failure ([place]) 09/06/2016  . Non-ischemic cardiomyopathy ([place]) 08/27/2016  . Non-obstructive hypertrophic cardiomyopathy ([place]) 08/27/2016  . Mitral regurgitation 08/27/2016  . Polysubstance abuse ([place])   . Tobacco abuse 09/19/2014  . HTN (hypertension) 03/28/2011  . Bilateral lower extremity pain 03/28/2011    Current Outpatient Medications:  .  albuterol (VENTOLIN HFA) 108 (90 Base) MCG/ACT inhaler, Inhale 1-2 puffs into the lungs every 6 (six) hours as needed for wheezing or shortness of breath., Disp: 8.5 g, Rfl: 1 .  amiodarone (PACERONE) 200 MG tablet, Take 1 tablet (200 mg total) by mouth daily., Disp: 90 tablet, Rfl: 3 .  digoxin (LANOXIN) 0.125 MG tablet, Take 1 tablet (0.125 mg total) by mouth daily., Disp: 90 tablet, Rfl: 1 .  empagliflozin (JARDIANCE) 10 MG TABS tablet, Take 1 tablet (10 mg total) by mouth daily., Disp: 30 tablet, Rfl: 1 .  eplerenone (INSPRA) 25 MG tablet, Take 1 tablet (25 mg total) by mouth daily., Disp: 90 tablet, Rfl: 1 .  furosemide (LASIX) 20 MG tablet, Take 1 tablet (20 mg total) by mouth daily., Disp: 90 tablet, Rfl: 11 .  rosuvastatin (CRESTOR) 5 MG tablet, TAKE 1 TABLET (5 MG TOTAL) BY MOUTH DAILY., Disp: 90 tablet, Rfl: 3 .  sacubitril-valsartan (ENTRESTO) 24-26 MG,  Take 1 tablet by mouth 2 (two) times daily., Disp: 180 tablet, Rfl: 3 .  tamsulosin (FLOMAX) 0.4 MG CAPS capsule, Take 1 capsule (0.4 mg total) by mouth daily after supper., Disp: 30 capsule, Rfl: 2 .  Blood Pressure Monitor DEVI, Please use to monitor blood pressure as directed.  ICD: I10 (hypertension) (Patient not taking: Reported on 01/11/2021), Disp: 1 each, Rfl: 0 No Known Allergies    Social History   Socioeconomic History  . Marital status: Married    Spouse name: Not on file  . Number of children: Not on file  . Years of education: Not on file  . Highest education level: Not on file  Occupational History  . Occupation: Engineer, manufacturing systems    Comment: has not been able to work steadily for the last year or two  Tobacco Use  . Smoking status: Current Every Day Smoker    Packs/day: 0.10    Years: 48.00    Pack years: 4.80    Types: Cigarettes  . Smokeless tobacco: Never Used  . Tobacco comment: cutting back 2  per day  Vaping Use  . Vaping Use: Never used  Substance and Sexual Activity  . Alcohol use: Yes    Alcohol/week: 1.0 standard drink    Types: 1 Cans of beer per week    Comment: occasionally  . Drug use: Yes    Types: Marijuana    Comment: daily 4 times last 2 weeks ago as of 05/31/2019  . Sexual activity: Yes  Birth control/protection: None  Other Topics Concern  . Not on file  Social History Narrative  . Not on file   Social Determinants of Health   Financial Resource Strain: High Risk  . Difficulty of Paying Living Expenses: Hard  Food Insecurity: No Food Insecurity  . Worried About Charity fundraiser in the Last Year: Never true  . Ran Out of Food in the Last Year: Never true  Transportation Needs: No Transportation Needs  . Lack of Transportation (Medical): No  . Lack of Transportation (Non-Medical): No  Physical Activity: Not on file  Stress: Not on file  Social Connections: Not on file  Intimate Partner Violence: Not on file    Physical  Exam      Future Appointments  Date Time Provider [place]  01/16/2021 11:30 AM [facility] PA/NP [facility] None  01/23/2021 10:30 AM INT-CCM SOCIAL WORK IMP-IMCR [facility]    BP 110/68   Pulse 70   Resp 18   SpO2 97%  B/p standing-114/80 Weight yesterday-? Last visit weight-189  Pt is staying with his daughter now, he got evicted. There was no assistance avail when he tried to get it.  I p/u his meds and brought them to him.  He is out of the digoxin.  Pharmacy is going to pull the albuterol inhaler from [place]. He will go tomor to get the digoxin and pay his previous bill.  Pt denies increased sob. He sometimes gets dizzy when standing. Orthostatics checked-negative. He had a slight c/p a few days, sharp pain in nature, thinks he moved too fast when he got up but it resolved within 55min. No other issues or complaints during that time. hasnt happened since.  He had appointment today for labs but had to cancel but got it resch for next week.  He has not been able to weigh since moving.  Will f/u next week.   Marylouise Stacks, [place] [facility] Paramedic  01/11/21

## 2021-01-16 ENCOUNTER — Encounter (HOSPITAL_COMMUNITY): Payer: Medicare (Managed Care)

## 2021-01-23 ENCOUNTER — Ambulatory Visit: Payer: Medicare (Managed Care) | Admitting: Licensed Clinical Social Worker

## 2021-01-23 NOTE — Chronic Care Management (AMB) (Signed)
  Care Management   Social Work Visit Note  01/23/2021 Name: Edward Mullen MRN: [id number] DOB: 1955/09/29  Edward Mullen is a 65 y.o. year old male who sees Aslam, Loralyn Freshwater, MD for primary care. The care management team was consulted for assistance with care management and care coordination needs related to Financial Difficulties related to address rental assistance.     Assessment: Review of patient history, allergies, and health status during evaluation of patient need for care management/care coordination services.    Interventions:  SW made an unsuccessful phone call to patient. Patient voicemail is not set up and SW unable to leave a VM.  SW reviewed chart and searched for additional resource to assist with housing.    Plan:  patient will work with BSW to address needs related to housing. SW advised patient to apply for rental assistance through [place].  Milus Height, [place]  Social Worker [facility] Care Management  [phone]

## 2021-01-24 ENCOUNTER — Encounter (HOSPITAL_COMMUNITY): Payer: Self-pay | Admitting: Adult Health

## 2021-01-24 NOTE — Progress Notes (Unsigned)
Team Based Care Meeting  HF MD- NA  HF NP - [place] NP-C   [place]   Medications concerns? No he is able to take his own medications.   Transportation issues ? No   Education needs? Yes on ongoing compliance issues.   SDOH  - Evicted and living with his daughter. Working on housing.   Continue HF Paramedicine.   Cricket Goodlin NP-C  4:23 PM

## 2021-01-30 ENCOUNTER — Telehealth (HOSPITAL_COMMUNITY): Payer: Self-pay

## 2021-01-30 NOTE — Telephone Encounter (Signed)
Pt returned my call, he reports feeling good, he needs to call in meds tomor, he will  handle that along with picking them up.  Advised him to contact me if needs assistance with that.  He is still living with daughter, I will f/u next week and make a home visit with him.   Marylouise Stacks, EMT-Paramedic  01/30/21

## 2021-01-30 NOTE — Telephone Encounter (Signed)
Attempted to reach pt regarding home visit this week, there was no answer, unable to LVM.  Marylouise Stacks, EMT-Paramedic 01/30/21

## 2021-02-06 ENCOUNTER — Telehealth (HOSPITAL_COMMUNITY): Payer: Self-pay

## 2021-02-06 NOTE — Telephone Encounter (Signed)
Contacted pt regarding home visit this week, no answer. Unable to LVM.   Marylouise Stacks, EMT-Paramedic  02/06/21

## 2021-02-20 ENCOUNTER — Other Ambulatory Visit: Payer: Self-pay | Admitting: Student

## 2021-02-20 ENCOUNTER — Telehealth (HOSPITAL_COMMUNITY): Payer: Self-pay

## 2021-02-20 ENCOUNTER — Other Ambulatory Visit (HOSPITAL_COMMUNITY): Payer: Self-pay | Admitting: Cardiology

## 2021-02-20 NOTE — Telephone Encounter (Signed)
Contacted pt regarding home visit this week, he is good for a visit tomor. He did mention he needs refills on jardiance. I did contact pharmacy to make sure they have all his meds on file and they do.  They will have jardiance and tamsulosin ready for him to  p/u on Friday.   Marylouise Stacks, EMT-Paramedic  02/20/21

## 2021-02-21 ENCOUNTER — Other Ambulatory Visit (HOSPITAL_COMMUNITY): Payer: Self-pay

## 2021-02-21 NOTE — Progress Notes (Signed)
Paramedicine Encounter    Patient ID: Edward Mullen, male    DOB: October 03, 1955, 65 y.o.   MRN: [id number]   Patient Care Team: Harvie Heck, MD as PCP - General Drema Pry as Social Worker Barrington Ellison, RN as [facility]  Patient Active Problem List   Diagnosis Date Noted   Urinary retention    Scrotal mass 08/01/2020   Nocturia 08/01/2020   Lumbar spinal stenosis 06/08/2019   Healthcare maintenance 05/11/2019   Housing problems 05/11/2019   Difficulty urinating 04/07/2018   Colon cancer screening 04/07/2018   Anemia 12/19/2016   CKD (chronic kidney disease) stage 3, GFR 30-59 ml/min (HCC) 82/42/3536   Chronic systolic heart failure ([place]) 09/06/2016   Non-ischemic cardiomyopathy ([place]) 08/27/2016   Non-obstructive hypertrophic cardiomyopathy ([place]) 08/27/2016   Mitral regurgitation 08/27/2016   Polysubstance abuse ([place])    Tobacco abuse 09/19/2014   HTN (hypertension) 03/28/2011   Bilateral lower extremity pain 03/28/2011    Current Outpatient Medications:    albuterol (VENTOLIN HFA) 108 (90 Base) MCG/ACT inhaler, INHALE 1 TO 2 PUFFS BY MOUTH EVERY 6 HOURS AS NEEDED FOR WHEEZING OR SHORTNESS OF BREATH, Disp: 18 g, Rfl: 2   amiodarone (PACERONE) 200 MG tablet, Take 1 tablet (200 mg total) by mouth daily., Disp: 90 tablet, Rfl: 3   Blood Pressure Monitor DEVI, Please use to monitor blood pressure as directed.  ICD: I10 (hypertension) (Patient taking differently: Please use to monitor blood pressure as directed.  ICD: I10 (hypertension)), Disp: 1 each, Rfl: 0   digoxin (LANOXIN) 0.125 MG tablet, Take 1 tablet (0.125 mg total) by mouth daily., Disp: 90 tablet, Rfl: 1   eplerenone (INSPRA) 25 MG tablet, Take 1 tablet (25 mg total) by mouth daily., Disp: 90 tablet, Rfl: 1   furosemide (LASIX) 20 MG tablet, Take 1 tablet (20 mg total) by mouth daily., Disp: 90 tablet, Rfl: 11   JARDIANCE 10 MG TABS tablet, TAKE 1 TABLET (10 MG TOTAL) BY MOUTH  DAILY., Disp: 30 tablet, Rfl: 6   rosuvastatin (CRESTOR) 5 MG tablet, TAKE 1 TABLET (5 MG TOTAL) BY MOUTH DAILY., Disp: 90 tablet, Rfl: 3   sacubitril-valsartan (ENTRESTO) 24-26 MG, Take 1 tablet by mouth 2 (two) times daily., Disp: 180 tablet, Rfl: 3   tamsulosin (FLOMAX) 0.4 MG CAPS capsule, TAKE ONE CAPSULE BY MOUTH ONCE DAILY AFTER SUPPER, Disp: 30 capsule, Rfl: 2 No Known Allergies    Social History   Socioeconomic History   Marital status: Married    Spouse name: Not on file   Number of children: Not on file   Years of education: Not on file   Highest education level: Not on file  Occupational History   Occupation: Engineer, manufacturing systems    Comment: has not been able to work steadily for the last year or two  Tobacco Use   Smoking status: Every Day    Packs/day: 0.10    Years: 48.00    Pack years: 4.80    Types: Cigarettes   Smokeless tobacco: Never   Tobacco comments:    cutting back 2  per day  Vaping Use   Vaping Use: Never used  Substance and Sexual Activity   Alcohol use: Yes    Alcohol/week: 1.0 standard drink    Types: 1 Cans of beer per week    Comment: occasionally   Drug use: Yes    Types: Marijuana    Comment: daily 4 times last 2 weeks ago as of  05/31/2019   Sexual activity: Yes    Birth control/protection: None  Other Topics Concern   Not on file  Social History Narrative   Not on file   Social Determinants of Health   Financial Resource Strain: High Risk   Difficulty of Paying Living Expenses: Hard  Food Insecurity: No Food Insecurity   Worried About Running Out of Food in the Last Year: Never true   Ran Out of Food in the Last Year: Never true  Transportation Needs: No Transportation Needs   Lack of Transportation (Medical): No   Lack of Transportation (Non-Medical): No  Physical Activity: Not on file  Stress: Not on file  Social Connections: Not on file  Intimate Partner Violence: Not on file    Physical Exam      No future  appointments.  BP 130/72   Pulse 60   Resp 18   SpO2 97%    Pt still living with his daughter. No luck for him on the housing.  Pt states he has been getting off balance sometimes-he also has feet trouble-pain and makes it hard to walk so he isnt sure if that is causing it or not. He also reports a burning pain to his lower leg as well.  I called in 2 meds for him yesterday and he will be able to pick it up Friday.  He will go to PCP soon for that. He does use cane at times to help him.  Pt denies c/p, denies increased sob, no dizziness. No edema noted. He does have scales here but does not weigh.  He does report good urine output.  His last clinic appoint was cancelled but he didn't resch that, so I asked him to call to resch that. He will and he has the number.  Pt has resources needed, no recent admissions. Managing his own meds. Pt is stable for paramedicine d/c.   Patient is now discharged from [facility].  Patient has/has not met the following goals:  Yes :Patient expresses basic understanding of medications and what they are for Yes :Patient able to verbalize heart failure specific dietary/fluid restrictions Yes :Patient is aware of who to call if they have medical concerns or if they need to schedule or change appts Yes :Patient has a scale for daily weights and weighs regularly Yes :Patient able to verbalize concerning symptoms when they should call the HF clinic (weight gain ranges, etc) Yes :Patient has a PCP and has seen within the past year or has upcoming appt Yes :Patient has reliable access to getting their medications Yes :Patient has shown they are able to reorder medications reliably No :Patient has had admission in past 30 days- if yes how many? No :Patient has had admission in past 90 days- if yes how many?  Discharge Comments:  He does not weigh daily-encouraged him to do that and explained why.  He has resources needed.    Marylouise Stacks,  [facility] Paramedic  02/21/21

## 2021-04-03 ENCOUNTER — Other Ambulatory Visit (HOSPITAL_COMMUNITY): Payer: Self-pay

## 2021-04-17 ENCOUNTER — Other Ambulatory Visit (HOSPITAL_COMMUNITY): Payer: Self-pay

## 2021-04-18 ENCOUNTER — Other Ambulatory Visit (HOSPITAL_COMMUNITY): Payer: Self-pay

## 2021-04-26 ENCOUNTER — Other Ambulatory Visit (HOSPITAL_COMMUNITY): Payer: Self-pay | Admitting: Cardiology

## 2021-05-25 ENCOUNTER — Encounter: Payer: Self-pay | Admitting: *Deleted

## 2021-05-25 NOTE — Progress Notes (Unsigned)

## 2021-05-28 ENCOUNTER — Telehealth: Payer: Self-pay | Admitting: Internal Medicine

## 2021-05-28 NOTE — Telephone Encounter (Signed)
Things That May Be Affecting Your Health:  Alcohol  Hearing loss  Pain    Depression Y Home Safety  Sexual Health   Diabetes  Lack of physical activity  Stress   Difficulty with daily activities  Loneliness  Tiredness   Drug use  Medicines Y Tobacco use   Falls  Motor Vehicle Safety  Weight   Food choices  Oral Health  Other    YOUR PERSONALIZED HEALTH PLAN : 1. Schedule your next subsequent Medicare Wellness visit in one year 2. Attend all of your regular appointments to address your medical issues 3. Complete the preventative screenings and services   Annual Wellness Visit   Medicare Covered Preventative Screenings and [place] Men and Women Who How Often Need? Date of Last Service Action  Abdominal Aortic Aneurysm Adults with AAA risk factors Once  @[facility]IMMADMINDATE ([phone])@    Alcohol Misuse and Counseling All Adults Screening once a year if no alcohol misuse. Counseling up to 4 face to face sessions.     Bone Density Measurement  Adults at risk for osteoporosis Once every 2 yrs  @[facility]IMMADMINDATE ([phone])@    Lipid Panel Z13.6 All adults without CV disease Once every 5 yrs  @[facility]IM[facility]INDATE ([phone])@     Colorectal Cancer  Stool sample or Colonoscopy All adults 38 and older  Once every year Every 10 years  @[facility]IM[facility]INDATE ([id number])@ @[facility]IM[facility]INDATE ([phone])@ @[facility]IM[facility]INDATE ([id number])@    Depression All Adults Once a year Y Today   Diabetes Screening Blood glucose, post glucose load, or GTT Z13.1 All adults at risk Pre-diabetics Once per year Twice per year  @[facility]IM[facility]INDATE ([phone])@    Diabetes  Self-Management Training All adults Diabetics 10 hrs first year; 2 hours subsequent years. Requires Copay     Glaucoma Diabetics Family history of glaucoma African Americans 37 yrs + Hispanic Americans 18 yrs + Annually - requires coppay  @[facility]IMMADMINDATE [phone])@    Hepatitis C Z72.89 or F19.20 High Risk for HCV Born between  1945 and 1965 Annually Once      HIV Z11.4 All adults based on risk Annually btw ages 71 & 64 regardless of risk Annually > 65 yrs if at increased risk      Lung Cancer Screening Asymptomatic adults aged 95-77 with 30 pack yr history and current smoker OR quit within the last 15 yrs Annually Must have counseling and shared decision making documentation before first screen Y     Medical Nutrition Therapy Adults with  Diabetes Renal disease Kidney transplant within past 3 yrs 3 hours first year; 2 hours subsequent years     Obesity and Counseling All adults Screening once a year Counseling if BMI 30 or higher  Today   Tobacco Use Counseling Adults who use tobacco  Up to 8 visits in one year Y    Vaccines Z23 Hepatitis B Influenza  Pneumonia  Adults  Once Once every flu season Two different vaccines separated by one year     Next Annual Wellness Visit People with Medicare Every year  Today     Services & Screenings Women Who How Often Need  Date of Last Service Action  Mammogram  Z12.31 Women over 40 One baseline ages 66-39. Annually ager 40 yrs+      Pap tests All women Annually if high risk. Every 2 yrs for normal risk women      Screening for cervical cancer with  Pap (Z01.419 nl or Z01.411abnl) & HPV Z11.51 Women aged 85 to 55 Once every 5 yrs  Screening pelvic and breast exams All women Annually if high risk. Every 2 yrs for normal risk women     Sexually Transmitted Diseases Chlamydia Gonorrhea Syphilis All at risk adults Annually for non pregnant females at increased risk         [place] Men Who How Ofter Need  Date of Last Service Action  Prostate Cancer - DRE & PSA Men over 50 Annually.  DRE might require a copay.        Sexually Transmitted Diseases Syphilis All at risk adults Annually for men at increased risk      Health Maintenance List Health Maintenance  Topic Date Due   COVID-19 Vaccine (1) Never done   Zoster Vaccines-  Shingrix (1 of 2) Never done   INFLUENZA VACCINE  03/12/2021   TETANUS/TDAP  05/21/2021   COLONOSCOPY (Pts 45-57yrs Insurance coverage will need to be confirmed)  06/01/2021   Hepatitis C Screening  Completed   HPV VACCINES  Aged Out

## 2021-06-01 ENCOUNTER — Encounter: Payer: Self-pay | Admitting: Gastroenterology

## 2021-07-27 ENCOUNTER — Other Ambulatory Visit: Payer: Self-pay | Admitting: Student

## 2021-10-01 ENCOUNTER — Encounter (HOSPITAL_COMMUNITY): Payer: Self-pay | Admitting: Emergency Medicine

## 2021-10-01 ENCOUNTER — Ambulatory Visit (INDEPENDENT_AMBULATORY_CARE_PROVIDER_SITE_OTHER): Payer: Medicare (Managed Care)

## 2021-10-01 ENCOUNTER — Other Ambulatory Visit: Payer: Self-pay

## 2021-10-01 ENCOUNTER — Ambulatory Visit (HOSPITAL_COMMUNITY)
Admission: EM | Admit: 2021-10-01 | Discharge: 2021-10-01 | Disposition: A | Discharge status: Home / Self Care | Payer: Medicare (Managed Care) | Attending: Family Medicine | Admitting: Family Medicine

## 2021-10-01 DIAGNOSIS — R062 Wheezing: Secondary | ICD-10-CM

## 2021-10-01 DIAGNOSIS — R0600 Dyspnea, unspecified: Secondary | ICD-10-CM | POA: Diagnosis not present

## 2021-10-01 DIAGNOSIS — R0602 Shortness of breath: Secondary | ICD-10-CM

## 2021-10-01 DIAGNOSIS — I509 Heart failure, unspecified: Secondary | ICD-10-CM | POA: Diagnosis not present

## 2021-10-01 MED ORDER — IPRATROPIUM-ALBUTEROL 0.5-2.5 (3) MG/3ML IN SOLN
RESPIRATORY_TRACT | Status: AC
  Filled 2021-10-01: qty 3

## 2021-10-01 MED ORDER — IPRATROPIUM-ALBUTEROL 0.5-2.5 (3) MG/3ML IN SOLN
3.0000 mL | Freq: Once | RESPIRATORY_TRACT | Status: AC
  Administered 2021-10-01: 3 mL via RESPIRATORY_TRACT

## 2021-10-01 NOTE — Discharge Instructions (Addendum)
Please proceed to the emergency room for further evaluation and treatment, since we cannot get you better enough to go home.   Your chest x-ray did not show fluid in the lungs, nor did it show any pneumonia

## 2021-10-01 NOTE — ED Triage Notes (Signed)
Pt reports SOB and dyspnea with exertion x 3 days. States called PCP and was instructed to come get a chest x-ray. Hx of CHF.

## 2021-10-01 NOTE — ED Triage Notes (Signed)
SPO2 checked on room air 100%.

## 2021-10-01 NOTE — ED Provider Notes (Addendum)
[place]    CSN: [id number] Arrival date & time: 10/01/21  1541      History   Chief Complaint Chief Complaint  Patient presents with   Shortness of Breath    HPI Edward Mullen is a 66 y.o. male.    Shortness of Breath Here for worsened shortness of breath in the last 3 days.  He gets worse when he walks a few steps, and also when he lies down.  He has had no fever, though he has had some cough productive of some white frothy sputum.  No fever or chills  .  Past medical history includes congestive heart failure, and he does take fluid pills.  He also has been prescribed an inhaler and a nebulizer, and he used a nebulizer treatment about an hour before coming here today.  He states it did not help much, maybe a little bit  Past Medical History:  Diagnosis Date   Arthritis    "feels like it in my legs" (09/20/2014)   Borderline type 2 diabetes mellitus    patient denies   CHF (congestive heart failure) ([place])    Dysrhythmia    Hypertension    NSVT (nonsustained ventricular tachycardia) 09/06/2016    Patient Active Problem List   Diagnosis Date Noted   Urinary retention    Scrotal mass 08/01/2020   Nocturia 08/01/2020   Lumbar spinal stenosis 06/08/2019   Healthcare maintenance 05/11/2019   Housing problems 05/11/2019   Difficulty urinating 04/07/2018   Colon cancer screening 04/07/2018   Anemia 12/19/2016   CKD (chronic kidney disease) stage 3, GFR 30-59 ml/min (HCC) 81/08/7508   Chronic systolic heart failure ([place]) 09/06/2016   Non-ischemic cardiomyopathy ([place]) 08/27/2016   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   Mitral regurgitation 08/27/2016   Polysubstance abuse ([place])    Tobacco abuse 09/19/2014   HTN (hypertension) 03/28/2011   Bilateral lower extremity pain 03/28/2011    Past Surgical History:  Procedure Laterality Date   CARDIAC CATHETERIZATION N/A 08/27/2016   Procedure: Right/Left Heart Cath and Coronary  Angiography;  Surgeon: Larey Dresser, MD;  Location: [facility];  Service: Cardiovascular;  Laterality: N/A;   COLONOSCOPY     CYSTECTOMY Right    "back of my shoulder"   RIGHT/LEFT HEART CATH AND CORONARY ANGIOGRAPHY N/A 12/04/2020   Procedure: RIGHT/LEFT HEART CATH AND CORONARY ANGIOGRAPHY;  Surgeon: Larey Dresser, MD;  Location: [facility];  Service: Cardiovascular;  Laterality: N/A;   TONSILLECTOMY         Home Medications    Prior to Admission medications   Medication Sig Start Date End Date Taking? Authorizing Provider  albuterol (VENTOLIN HFA) 108 (90 Base) MCG/ACT inhaler INHALE 1 TO 2 PUFFS BY MOUTH EVERY 6 HOURS AS NEEDED FOR WHEEZING OR SHORTNESS OF BREATH 07/30/21   Harvie Heck, MD  amiodarone (PACERONE) 200 MG tablet Take 1 tablet (200 mg total) by mouth daily. 12/26/20   Larey Dresser, MD  Blood Pressure Monitor DEVI Please use to monitor blood pressure as directed.  ICD: I10 (hypertension) Patient taking differently: Please use to monitor blood pressure as directed.  ICD: I10 (hypertension) 08/03/20   Agyei, Caprice Kluver, MD  digoxin (LANOXIN) 0.125 MG tablet TAKE 1 TABLET (0.125 MG TOTAL) BY MOUTH DAILY. 04/26/21   Larey Dresser, MD  eplerenone (INSPRA) 25 MG tablet TAKE 1 TABLET (25 MG TOTAL) BY MOUTH DAILY. 04/26/21   Larey Dresser, MD  furosemide (LASIX) 20  MG tablet Take 1 tablet (20 mg total) by mouth daily. 12/26/20 12/26/21  Larey Dresser, MD  JARDIANCE 10 MG TABS tablet TAKE 1 TABLET (10 MG TOTAL) BY MOUTH DAILY. 02/20/21   Larey Dresser, MD  rosuvastatin (CRESTOR) 5 MG tablet TAKE 1 TABLET (5 MG TOTAL) BY MOUTH DAILY. 12/26/20   Larey Dresser, MD  sacubitril-valsartan (ENTRESTO) 24-26 MG Take 1 tablet by mouth 2 (two) times daily. 12/26/20   Larey Dresser, MD  tamsulosin (FLOMAX) 0.4 MG CAPS capsule TAKE ONE CAPSULE BY MOUTH ONCE DAILY AFTER SUPPER 02/20/21   Riesa Pope, MD    Family History Family History  Problem Relation  Age of Onset   Stroke Mother 65   Heart disease Father 58   Colon cancer Neg Hx    Colon polyps Neg Hx    Esophageal cancer Neg Hx    Stomach cancer Neg Hx    Rectal cancer Neg Hx     Social History Social History   Tobacco Use   Smoking status: Every Day    Packs/day: 0.10    Years: 48.00    Pack years: 4.80    Types: Cigarettes   Smokeless tobacco: Never   Tobacco comments:    cutting back 2  per day  Vaping Use   Vaping Use: Never used  Substance Use Topics   Alcohol use: Yes    Alcohol/week: 1.0 standard drink    Types: 1 Cans of beer per week    Comment: occasionally   Drug use: Yes    Types: Marijuana    Comment: daily 4 times last 2 weeks ago as of 05/31/2019     Allergies   Patient has no known allergies.   Review of Systems Review of Systems  Respiratory:  Positive for shortness of breath.     Physical Exam Triage Vital Signs ED Triage Vitals  Enc Vitals Group     BP 10/01/21 1620 (!) 124/103     Pulse Rate 10/01/21 1620 80     Resp 10/01/21 1620 (!) 24     Temp --      Temp src --      SpO2 10/01/21 1550 100 %     Weight 10/01/21 1622 188 lb 15 oz (85.7 kg)     Height 10/01/21 1622 6' (1.829 m)     Head Circumference --      Peak Flow --      Pain Score 10/01/21 1622 0     Pain Loc --      Pain Edu? --      Excl. in [place]? --    No data found.  Updated Vital Signs BP (!) 124/103 (BP Location: Right Arm)    Pulse 80    Resp (!) 24    Ht 6' (1.829 m)    Wt 85.7 kg    SpO2 100%    BMI 25.62 kg/m   Visual Acuity Right Eye Distance:   Left Eye Distance:   Bilateral Distance:    Right Eye Near:   Left Eye Near:    Bilateral Near:     Physical Exam Vitals reviewed.  Constitutional:      Appearance: He is not ill-appearing, toxic-appearing or diaphoretic.     Comments: Is working to breathe some at rest here in  HENT:     Mouth/Throat:     Mouth: Mucous membranes are moist.  Eyes:     Extraocular Movements: Extraocular movements  intact.     Pupils: Pupils are equal, round, and reactive to light.  Cardiovascular:     Rate and Rhythm: Normal rate and regular rhythm.     Heart sounds: No murmur heard. Pulmonary:     Breath sounds: Wheezing (bilateral) present.  Musculoskeletal:     Cervical back: Neck supple.     Right lower leg: No edema.     Left lower leg: No edema.  Lymphadenopathy:     Cervical: No cervical adenopathy.  Skin:    Capillary Refill: Capillary refill takes less than 2 seconds.     Coloration: Skin is not jaundiced or pale.  Neurological:     General: No focal deficit present.     Mental Status: He is alert and oriented to person, place, and time.  Psychiatric:        Behavior: Behavior normal.     UC Treatments / Results  Labs (all labs ordered are listed, but only abnormal results are displayed) Labs Reviewed - No data to display  EKG   Radiology DG Chest 2 View  Result Date: 10/01/2021 CLINICAL DATA:  Dyspnea on exertion and orthopnea. History of CHF, wheezing. EXAM: CHEST - 2 VIEW COMPARISON:  Chest x-ray dated 11/28/2020. FINDINGS: Stable cardiomegaly. Lungs are clear. No pleural effusion or pneumothorax is seen. Osseous structures about the chest are unremarkable. IMPRESSION: No active cardiopulmonary disease. No evidence of pneumonia or pulmonary edema. Electronically Signed   By: Franki Cabot M.D.   On: 10/01/2021 16:50    Procedures Procedures (including critical care time)  Medications Ordered in UC Medications  ipratropium-albuterol (DUONEB) 0.5-2.5 (3) MG/3ML nebulizer solution 3 mL (3 mLs Nebulization Given 10/01/21 1710)    Initial Impression / Assessment and Plan / UC Course  I have reviewed the triage vital signs and the nursing notes.  Pertinent labs & imaging results that were available during my care of the patient were reviewed by me and considered in my medical decision making (see chart for details).     Allergy reading states there is no pulmonary  fluid.  Also there is no pneumonia. Nebulizer treatment with albuterol and ipratropium was done, and he had some slight relief of his dyspnea.  He however continues to breathe 24-26 times a minute and is still wheezing on exam.  I have asked him to proceed to the emergency room for further evaluation and treatment Final Clinical Impressions(s) / UC Diagnoses   Final diagnoses:  SOB (shortness of breath)  Wheezing  Chronic congestive heart failure, unspecified heart failure type [facility])     Discharge Instructions      Please proceed to the emergency room for further evaluation and treatment, since we cannot get you better enough to go home.   Your chest x-ray did not show fluid in the lungs, nor did it show any pneumonia          ED Prescriptions   None    PDMP not reviewed this encounter.   Barrett Henle, MD 10/01/21 1724    Barrett Henle, MD 10/01/21 1726

## 2021-10-04 ENCOUNTER — Encounter (HOSPITAL_COMMUNITY): Payer: Self-pay

## 2021-10-04 ENCOUNTER — Other Ambulatory Visit: Payer: Self-pay

## 2021-10-04 ENCOUNTER — Inpatient Hospital Stay (HOSPITAL_COMMUNITY)
Admission: EM | Admit: 2021-10-04 | Discharge: 2021-10-11 | DRG: 286 | Disposition: A | Discharge status: Home / Self Care | Payer: Medicare (Managed Care) | Attending: Internal Medicine | Admitting: Internal Medicine

## 2021-10-04 ENCOUNTER — Emergency Department (HOSPITAL_COMMUNITY): Payer: Medicare (Managed Care)

## 2021-10-04 DIAGNOSIS — Z9114 Patient's other noncompliance with medication regimen: Secondary | ICD-10-CM | POA: Diagnosis not present

## 2021-10-04 DIAGNOSIS — R042 Hemoptysis: Secondary | ICD-10-CM | POA: Diagnosis not present

## 2021-10-04 DIAGNOSIS — I248 Other forms of acute ischemic heart disease: Secondary | ICD-10-CM | POA: Diagnosis present

## 2021-10-04 DIAGNOSIS — K4090 Unilateral inguinal hernia, without obstruction or gangrene, not specified as recurrent: Secondary | ICD-10-CM | POA: Diagnosis present

## 2021-10-04 DIAGNOSIS — N1832 Chronic kidney disease, stage 3b: Secondary | ICD-10-CM | POA: Diagnosis present

## 2021-10-04 DIAGNOSIS — I428 Other cardiomyopathies: Secondary | ICD-10-CM | POA: Diagnosis present

## 2021-10-04 DIAGNOSIS — I130 Hypertensive heart and chronic kidney disease with heart failure and stage 1 through stage 4 chronic kidney disease, or unspecified chronic kidney disease: Principal | ICD-10-CM | POA: Diagnosis present

## 2021-10-04 DIAGNOSIS — J189 Pneumonia, unspecified organism: Secondary | ICD-10-CM | POA: Diagnosis present

## 2021-10-04 DIAGNOSIS — F17210 Nicotine dependence, cigarettes, uncomplicated: Secondary | ICD-10-CM | POA: Diagnosis present

## 2021-10-04 DIAGNOSIS — Z823 Family history of stroke: Secondary | ICD-10-CM

## 2021-10-04 DIAGNOSIS — Z79899 Other long term (current) drug therapy: Secondary | ICD-10-CM

## 2021-10-04 DIAGNOSIS — E1122 Type 2 diabetes mellitus with diabetic chronic kidney disease: Secondary | ICD-10-CM | POA: Diagnosis present

## 2021-10-04 DIAGNOSIS — I509 Heart failure, unspecified: Secondary | ICD-10-CM

## 2021-10-04 DIAGNOSIS — N179 Acute kidney failure, unspecified: Secondary | ICD-10-CM | POA: Diagnosis present

## 2021-10-04 DIAGNOSIS — E876 Hypokalemia: Secondary | ICD-10-CM | POA: Diagnosis not present

## 2021-10-04 DIAGNOSIS — I5082 Biventricular heart failure: Secondary | ICD-10-CM | POA: Diagnosis present

## 2021-10-04 DIAGNOSIS — Z20822 Contact with and (suspected) exposure to covid-19: Secondary | ICD-10-CM | POA: Diagnosis present

## 2021-10-04 DIAGNOSIS — F1210 Cannabis abuse, uncomplicated: Secondary | ICD-10-CM | POA: Diagnosis present

## 2021-10-04 DIAGNOSIS — I2510 Atherosclerotic heart disease of native coronary artery without angina pectoris: Secondary | ICD-10-CM | POA: Diagnosis present

## 2021-10-04 DIAGNOSIS — F1910 Other psychoactive substance abuse, uncomplicated: Secondary | ICD-10-CM | POA: Diagnosis present

## 2021-10-04 DIAGNOSIS — I472 Ventricular tachycardia, unspecified: Secondary | ICD-10-CM | POA: Diagnosis present

## 2021-10-04 DIAGNOSIS — I959 Hypotension, unspecified: Secondary | ICD-10-CM | POA: Diagnosis present

## 2021-10-04 DIAGNOSIS — I5043 Acute on chronic combined systolic (congestive) and diastolic (congestive) heart failure: Secondary | ICD-10-CM | POA: Diagnosis not present

## 2021-10-04 DIAGNOSIS — F1410 Cocaine abuse, uncomplicated: Secondary | ICD-10-CM | POA: Diagnosis present

## 2021-10-04 DIAGNOSIS — Z8249 Family history of ischemic heart disease and other diseases of the circulatory system: Secondary | ICD-10-CM

## 2021-10-04 DIAGNOSIS — E119 Type 2 diabetes mellitus without complications: Secondary | ICD-10-CM | POA: Diagnosis present

## 2021-10-04 DIAGNOSIS — I5022 Chronic systolic (congestive) heart failure: Secondary | ICD-10-CM

## 2021-10-04 DIAGNOSIS — Z7982 Long term (current) use of aspirin: Secondary | ICD-10-CM | POA: Diagnosis not present

## 2021-10-04 DIAGNOSIS — I5023 Acute on chronic systolic (congestive) heart failure: Secondary | ICD-10-CM | POA: Diagnosis not present

## 2021-10-04 DIAGNOSIS — T380X5A Adverse effect of glucocorticoids and synthetic analogues, initial encounter: Secondary | ICD-10-CM | POA: Diagnosis not present

## 2021-10-04 LAB — RESP PANEL BY RT-PCR (FLU A&B, COVID) ARPGX2
Influenza A by PCR: NEGATIVE
Influenza B by PCR: NEGATIVE
SARS Coronavirus 2 by RT PCR: NEGATIVE

## 2021-10-04 LAB — CBC
HCT: 47.3 % (ref 39.0–52.0)
Hemoglobin: 14.3 g/dL (ref 13.0–17.0)
MCH: 28.4 pg (ref 26.0–34.0)
MCHC: 30.2 g/dL (ref 30.0–36.0)
MCV: 93.8 fL (ref 80.0–100.0)
Platelets: 188 10*3/uL (ref 150–400)
RBC: 5.04 MIL/uL (ref 4.22–5.81)
RDW: 16.2 % — ABNORMAL HIGH (ref 11.5–15.5)
WBC: 7.1 10*3/uL (ref 4.0–10.5)
nRBC: 0 % (ref 0.0–0.2)

## 2021-10-04 LAB — BASIC METABOLIC PANEL
Anion gap: 12 (ref 5–15)
BUN: 22 mg/dL (ref 8–23)
CO2: 20 mmol/L — ABNORMAL LOW (ref 22–32)
Calcium: 8.8 mg/dL — ABNORMAL LOW (ref 8.9–10.3)
Chloride: 109 mmol/L (ref 98–111)
Creatinine, Ser: 1.66 mg/dL — ABNORMAL HIGH (ref 0.61–1.24)
GFR, Estimated: 45 mL/min — ABNORMAL LOW (ref >60)
Glucose, Bld: 125 mg/dL — ABNORMAL HIGH (ref 70–99)
Potassium: 3.8 mmol/L (ref 3.5–5.1)
Sodium: 141 mmol/L (ref 135–145)

## 2021-10-04 LAB — RAPID URINE DRUG SCREEN, HOSP PERFORMED
Amphetamines: NOT DETECTED
Barbiturates: NOT DETECTED
Benzodiazepines: NOT DETECTED
Cocaine: NOT DETECTED
Opiates: NOT DETECTED
Tetrahydrocannabinol: NOT DETECTED

## 2021-10-04 LAB — TROPONIN I (HIGH SENSITIVITY)
Troponin I (High Sensitivity): 43 ng/L — ABNORMAL HIGH (ref <18)
Troponin I (High Sensitivity): 37 ng/L — ABNORMAL HIGH (ref <18)

## 2021-10-04 LAB — DIGOXIN LEVEL: Digoxin Level: <0.2 ng/mL — ABNORMAL LOW (ref 0.8–2.0)

## 2021-10-04 LAB — BRAIN NATRIURETIC PEPTIDE: B Natriuretic Peptide: 3768.9 pg/mL — ABNORMAL HIGH (ref 0.0–100.0)

## 2021-10-04 MED ORDER — ACETAMINOPHEN 650 MG RE SUPP: 650.0000 mg | Freq: Four times a day (QID) | RECTAL | Status: DC | PRN

## 2021-10-04 MED ORDER — ROSUVASTATIN CALCIUM 5 MG PO TABS
5.0000 mg | ORAL_TABLET | Freq: Every day | ORAL | Status: DC
Start: 2021-10-04 — End: 2021-10-11
  Administered 2021-10-04 – 2021-10-11 (×8): 5 mg via ORAL
  Filled 2021-10-04 (×8): qty 1

## 2021-10-04 MED ORDER — FUROSEMIDE 10 MG/ML IJ SOLN
40.0000 mg | Freq: Once | INTRAMUSCULAR | Status: AC
  Administered 2021-10-04: 40 mg via INTRAVENOUS
  Filled 2021-10-04: qty 4

## 2021-10-04 MED ORDER — DEXAMETHASONE SODIUM PHOSPHATE 10 MG/ML IJ SOLN
10.0000 mg | Freq: Once | INTRAMUSCULAR | Status: AC
  Administered 2021-10-04: 10 mg via INTRAVENOUS
  Filled 2021-10-04: qty 1

## 2021-10-04 MED ORDER — ACETAMINOPHEN 325 MG PO TABS
650.0000 mg | ORAL_TABLET | Freq: Four times a day (QID) | ORAL | Status: DC | PRN
  Administered 2021-10-06 (×2): 650 mg via ORAL
  Filled 2021-10-04 (×3): qty 2

## 2021-10-04 MED ORDER — IPRATROPIUM-ALBUTEROL 0.5-2.5 (3) MG/3ML IN SOLN
3.0000 mL | Freq: Three times a day (TID) | RESPIRATORY_TRACT | Status: DC
  Administered 2021-10-05 – 2021-10-08 (×8): 3 mL via RESPIRATORY_TRACT
  Filled 2021-10-04 (×10): qty 3

## 2021-10-04 MED ORDER — DIGOXIN 125 MCG PO TABS
0.1250 mg | ORAL_TABLET | Freq: Every day | ORAL | Status: DC
  Administered 2021-10-05: 0.125 mg via ORAL
  Filled 2021-10-04: qty 1

## 2021-10-04 MED ORDER — ALBUTEROL SULFATE (2.5 MG/3ML) 0.083% IN NEBU
2.5000 mg | INHALATION_SOLUTION | Freq: Four times a day (QID) | RESPIRATORY_TRACT | Status: DC | PRN
  Administered 2021-10-05 – 2021-10-06 (×3): 2.5 mg via RESPIRATORY_TRACT
  Filled 2021-10-04 (×3): qty 3

## 2021-10-04 MED ORDER — FUROSEMIDE 10 MG/ML IJ SOLN: 80.0000 mg | Freq: Two times a day (BID) | INTRAMUSCULAR | Status: DC

## 2021-10-04 MED ORDER — IPRATROPIUM-ALBUTEROL 0.5-2.5 (3) MG/3ML IN SOLN
3.0000 mL | Freq: Four times a day (QID) | RESPIRATORY_TRACT | Status: DC | PRN
  Administered 2021-10-04: 3 mL via RESPIRATORY_TRACT
  Filled 2021-10-04: qty 3

## 2021-10-04 MED ORDER — AMIODARONE HCL 200 MG PO TABS
200.0000 mg | ORAL_TABLET | Freq: Every day | ORAL | Status: DC
  Administered 2021-10-04 – 2021-10-05 (×2): 200 mg via ORAL
  Filled 2021-10-04 (×2): qty 1

## 2021-10-04 MED ORDER — FUROSEMIDE 10 MG/ML IJ SOLN
80.0000 mg | Freq: Two times a day (BID) | INTRAMUSCULAR | Status: DC
  Administered 2021-10-05 – 2021-10-07 (×6): 80 mg via INTRAVENOUS
  Filled 2021-10-04 (×6): qty 8

## 2021-10-04 MED ORDER — EMPAGLIFLOZIN 10 MG PO TABS
10.0000 mg | ORAL_TABLET | Freq: Every day | ORAL | Status: DC
  Administered 2021-10-04 – 2021-10-11 (×8): 10 mg via ORAL
  Filled 2021-10-04 (×8): qty 1

## 2021-10-04 MED ORDER — IPRATROPIUM-ALBUTEROL 0.5-2.5 (3) MG/3ML IN SOLN
3.0000 mL | Freq: Once | RESPIRATORY_TRACT | Status: AC
  Administered 2021-10-04: 3 mL via RESPIRATORY_TRACT
  Filled 2021-10-04: qty 3

## 2021-10-04 MED ORDER — DIGOXIN 125 MCG PO TABS
0.1250 mg | ORAL_TABLET | Freq: Every day | ORAL | Status: DC
  Administered 2021-10-04: 0.125 mg via ORAL
  Filled 2021-10-04: qty 1

## 2021-10-04 MED ORDER — ASPIRIN EC 81 MG PO TBEC
81.0000 mg | DELAYED_RELEASE_TABLET | Freq: Every day | ORAL | Status: DC
Start: 2021-10-04 — End: 2021-10-06
  Administered 2021-10-04 – 2021-10-06 (×3): 81 mg via ORAL
  Filled 2021-10-04 (×3): qty 1

## 2021-10-04 MED ORDER — ENOXAPARIN SODIUM 40 MG/0.4ML IJ SOSY
40.0000 mg | PREFILLED_SYRINGE | INTRAMUSCULAR | Status: DC
  Administered 2021-10-04 – 2021-10-05 (×2): 40 mg via SUBCUTANEOUS
  Filled 2021-10-04 (×2): qty 0.4

## 2021-10-04 NOTE — ED Notes (Signed)
Provide pt a beverage and placed a dinner tray order.

## 2021-10-04 NOTE — Progress Notes (Signed)
Upon arrival to room for breathing treatment; patients nasal cannula not hooked up to flowmeter appropriately so patient was on room air. RN made aware and corrected nasal cannula on flowmeter so patient would be receiving oxygen. Treatment given.

## 2021-10-04 NOTE — ED Provider Notes (Signed)
[facility] EMERGENCY DEPARTMENT Provider Note   CSN: [id number] Arrival date & time: 10/04/21  1135     History  Chief Complaint  Patient presents with   Shortness of Breath   CHF Exacerbation    Edward Mullen is a 66 y.o. male. With past medical history of hypertension, systolic heart failure, CKD III, cardiomyopathy who presents to the emergency department with shortness of breath.   Patient states that he has been short of breath for around 3 days. States that it is progressively worsening with short episodes of chest pain that he describes as non-radiating and sharp. Additionally, notes that he has had increasing ankle swelling which prompted his ER visit. States he has not taken any of his medication in 2 days because he was being seen here. Additionally, endorses new cough without sputum production or fever.   Per chart review, appears patient was seen at urgent care on 10/01/21. At that time he had complained of SOB x3 days with increasing DOE. Was given duoneb. CXR without pulmonary edema. Was instructed to come to ED but appears that he did not go to ED.   He has gained 1.2kg since that visit. States baseline is actually around 209lb. Last Echo 11/29/20 with EF 20-25%.    Shortness of Breath Associated symptoms: chest pain and cough   Associated symptoms: no abdominal pain and no fever       Home Medications Prior to Admission medications   Medication Sig Start Date End Date Taking? Authorizing Provider  albuterol (VENTOLIN HFA) 108 (90 Base) MCG/ACT inhaler INHALE 1 TO 2 PUFFS BY MOUTH EVERY 6 HOURS AS NEEDED FOR WHEEZING OR SHORTNESS OF BREATH 07/30/21   Harvie Heck, MD  amiodarone (PACERONE) 200 MG tablet Take 1 tablet (200 mg total) by mouth daily. 12/26/20   Larey Dresser, MD  Blood Pressure Monitor DEVI Please use to monitor blood pressure as directed.  ICD: I10 (hypertension) Patient taking differently: Please use to monitor blood pressure  as directed.  ICD: I10 (hypertension) 08/03/20   Agyei, Caprice Kluver, MD  digoxin (LANOXIN) 0.125 MG tablet TAKE 1 TABLET (0.125 MG TOTAL) BY MOUTH DAILY. 04/26/21   Larey Dresser, MD  eplerenone (INSPRA) 25 MG tablet TAKE 1 TABLET (25 MG TOTAL) BY MOUTH DAILY. 04/26/21   Larey Dresser, MD  furosemide (LASIX) 20 MG tablet Take 1 tablet (20 mg total) by mouth daily. 12/26/20 12/26/21  Larey Dresser, MD  JARDIANCE 10 MG TABS tablet TAKE 1 TABLET (10 MG TOTAL) BY MOUTH DAILY. 02/20/21   Larey Dresser, MD  rosuvastatin (CRESTOR) 5 MG tablet TAKE 1 TABLET (5 MG TOTAL) BY MOUTH DAILY. 12/26/20   Larey Dresser, MD  sacubitril-valsartan (ENTRESTO) 24-26 MG Take 1 tablet by mouth 2 (two) times daily. 12/26/20   Larey Dresser, MD  tamsulosin (FLOMAX) 0.4 MG CAPS capsule TAKE ONE CAPSULE BY MOUTH ONCE DAILY AFTER SUPPER 02/20/21   Riesa Pope, MD      Allergies    Patient has no known allergies.    Review of Systems   Review of Systems  Constitutional:  Negative for fever.  Respiratory:  Positive for cough and shortness of breath.   Cardiovascular:  Positive for chest pain and leg swelling. Negative for palpitations.  Gastrointestinal:  Negative for abdominal pain.  Neurological:  Negative for syncope.  All other systems reviewed and are negative.  Physical Exam Updated Vital Signs BP 127/86 (BP Location: Left Arm)  Pulse 97    Temp 97.7 F (36.5 C) (Oral)    Resp (!) 22    Ht 6' (1.829 m)    Wt 86.2 kg    SpO2 99%    BMI 25.77 kg/m  Physical Exam Vitals and nursing note reviewed.  Constitutional:      Appearance: Normal appearance. He is well-developed. He is ill-appearing. He is not toxic-appearing.  HENT:     Head: Normocephalic and atraumatic.     Mouth/Throat:     Mouth: Mucous membranes are moist.  Eyes:     General: No scleral icterus.    Pupils: Pupils are equal, round, and reactive to light.  Cardiovascular:     Rate and Rhythm: Normal rate and regular rhythm.      Pulses: Normal pulses.     Heart sounds: No murmur heard. Pulmonary:     Effort: Tachypnea present.     Breath sounds: Examination of the right-upper field reveals wheezing. Examination of the left-upper field reveals wheezing and rales. Wheezing and rales present.  Chest:     Chest wall: No tenderness.  Abdominal:     General: Bowel sounds are normal. There is no distension.     Palpations: Abdomen is soft.     Tenderness: There is no abdominal tenderness.  Musculoskeletal:        General: Normal range of motion.     Cervical back: Neck supple.     Right lower leg: 3+ Pitting Edema present.     Left lower leg: 3+ Pitting Edema present.  Skin:    General: Skin is warm and dry.     Capillary Refill: Capillary refill takes less than 2 seconds.  Neurological:     General: No focal deficit present.     Mental Status: He is alert and oriented to person, place, and time. Mental status is at baseline.  Psychiatric:        Mood and Affect: Mood normal.        Behavior: Behavior normal.        Thought Content: Thought content normal.        Judgment: Judgment normal.    ED Results / Procedures / Treatments   Labs (all labs ordered are listed, but only abnormal results are displayed) Labs Reviewed  BASIC METABOLIC PANEL - Abnormal; Notable for the following components:      Result Value   CO2 20 (*)    Glucose, Bld 125 (*)    Creatinine, Ser 1.66 (*)    Calcium 8.8 (*)    GFR, Estimated 45 (*)    All other components within normal limits  CBC - Abnormal; Notable for the following components:   RDW 16.2 (*)    All other components within normal limits  BRAIN NATRIURETIC PEPTIDE - Abnormal; Notable for the following components:   B Natriuretic Peptide 3,768.9 (*)    All other components within normal limits  TROPONIN I (HIGH SENSITIVITY) - Abnormal; Notable for the following components:   Troponin I (High Sensitivity) 43 (*)    All other components within normal limits   TROPONIN I (HIGH SENSITIVITY) - Abnormal; Notable for the following components:   Troponin I (High Sensitivity) 37 (*)    All other components within normal limits  RESP PANEL BY RT-PCR (FLU A&B, COVID) [id number]   EKG None Sinus rhythm with Premature atrial complexes with Abberant conduction Left axis deviation Left bundle branch block  Radiology DG Chest 2 View  Result Date:  10/04/2021 CLINICAL DATA:  Chest pain and shortness of breath for 2 days. Lower extremity swelling. EXAM: CHEST - 2 VIEW COMPARISON:  10/01/2021 FINDINGS: Moderate to severe cardiac enlargement is stable. Tortuosity of thoracic aorta is unchanged. Mild scarring in left lung base is again seen. No evidence of pulmonary edema or other acute infiltrate. No evidence pleural effusion. IMPRESSION: Stable cardiomegaly and mild left basilar scarring. No active lung disease. Electronically Signed   By: Marlaine Hind M.D.   On: 10/04/2021 12:15    Procedures Procedures    Medications Ordered in ED Medications  dexamethasone (DECADRON) injection 10 mg (has no administration in time range)  ipratropium-albuterol (DUONEB) 0.5-2.5 (3) MG/3ML nebulizer solution 3 mL (has no administration in time range)  ipratropium-albuterol (DUONEB) 0.5-2.5 (3) MG/3ML nebulizer solution 3 mL (3 mLs Nebulization Given 10/04/21 1425)  furosemide (LASIX) injection 40 mg (40 mg Intravenous Given 10/04/21 1426)    ED Course/ Medical Decision Making/ A&P                           Medical Decision Making Amount and/or Complexity of Data Reviewed Labs: ordered. Radiology: ordered.  Risk Prescription drug management. Decision regarding hospitalization.  Patient presents to the ED with complaints of shortness of breath. This involves an extensive number of treatment options, and is a complaint that carries with it a high risk of complications and morbidity.   Additional history obtained:  Additional history obtained from: none  External  records from outside source obtained and reviewed including: previous ED visits   EKG: EKG: LBBB.   Cardiac Monitoring: The patient was maintained on a cardiac monitor.  I personally viewed and interpreted the cardiac monitored which showed an underlying rhythm of: sinus rhythm LBBB  Lab Results: I personally ordered, reviewed, and interpreted labs. Pertinent results include: CBC without leukocytosis or anemia BMP with creatinine 1.66, appears near baseline Initial troponin 43, repeat 37, delta -6 COVID and flu negative  Imaging Studies ordered:  I ordered imaging studies which included x-ray.  I independently reviewed & interpreted imaging & am in agreement with radiology impression. Imaging shows: Cardiomegaly without pulmonary edema, effusion, pneumonia  Medications  I ordered medication including Lasix for fluid volume overload, DuoNebs and steroids for wheezing Reevaluation of the patient after medication shows that patient improved  Consultations: I requested consultation with the hospitalist, Dr. Johnney Ou, at 1620  and discussed lab and imaging findings as well as pertinent plan - they recommend: admission   ED Course: 66 year old male who presents to emergency department with shortness of breath  Patient is a history of heart failure, presenting with likely acute decompensated heart failure causing fluid volume overload.  Etiology of decompensation uncertain at this time but likely due to him not taking his meds over the past 2 to 3 days.  Do not think this to be ACS at this time.  EKG without ischemia or infarction, troponins were slightly elevated however negative delta and has a history of positive troponins.  Consider this to be pulmonary in etiology such as COPD, asthma, PE or pneumonia.  No reported diagnosis of COPD.  He does have wheezing on exam and has albuterol at home so question whether he has underlying asthma as well.  He was given DuoNebs here in the  emergency department with improvement in his respiratory symptoms.  Considered PE however clinical presentation is not consistent.  Chest x-ray without pneumonia, no fever or leukocytosis.  He does  have cough however it is nonproductive.  Doubt developing pneumonia at this time.  CHF appears to be from medication noncompliance.  Patient was given Lasix here in the emergency department along with DuoNebs.  He symptomatically improved.  Subjectively improved from a respiratory standpoint after this.  He is hemodynamically stable at this time however given his elevated BNP and ongoing shortness of breath we will admit him for heart failure exacerbation.  After consideration of the diagnostic results and the patients response to treatment, I feel that the patent would benefit from admission. The patient has been appropriately medically screened and/or stabilized in the ED. I have low suspicion for any other emergent medical condition which would require further screening, evaluation or treatment in the ED or require inpatient management.  Final Clinical Impression(s) / ED Diagnoses Final diagnoses:  Acute on chronic systolic heart failure [facility])    Rx / DC Orders ED Discharge Orders     None         Mickie Hillier, PA-C 10/04/21 1628    Carmin Muskrat, MD 10/06/21 1320

## 2021-10-04 NOTE — ED Notes (Signed)
Gave verbal report to inpatient RN . RN reports it is okay to send patient

## 2021-10-04 NOTE — ED Notes (Signed)
Called [facility] and they will change to the new patient

## 2021-10-04 NOTE — ED Notes (Signed)
Pt states he takes Lasix for the fluid buildup in his legs. Pt legs bilaterally swollen +1 pitting edema. +1 dorsalis pedis pulses bilaterally

## 2021-10-04 NOTE — ED Triage Notes (Signed)
Pt reports increasing SOB and bilateral leg swelling for two days. Denies CP. States he was unable to take his lasix this morning due to SOB.

## 2021-10-04 NOTE — H&P (Addendum)
Date: 10/04/2021               Patient Name:  Edward Mullen MRN: [id number]  DOB: December 04, 1955 Age / Sex: 67 y.o., male   PCP: Harvie Heck, MD         Medical Service: Internal Medicine Teaching Service         Attending Physician: Dr. Lucious Groves, DO    First Contact: Farrel Gordon, DO Pager: ED [phone]  Second Contact: Sanjuana Letters, DO Pager: Maryjean Morn [phone]       After Hours (After 5p/  First Contact Pager: [phone]  weekends / holidays): Second Contact Pager: [phone]   SUBJECTIVE   Chief Complaint: Shortness of breath, lower extremity swelling  History of Present Illness:   Edward Mullen is a 66 year old gentleman living with a history of hypertension, diabetes, chronic systolic heart failure, history of NSVT, history of polysubstance abuse who presented to the ED after having shortness of breath, orthopnea, lower extremity swelling for the past week.  He notes that a week ago he felt as though his feet were swollen he was having to sleep on 2 pillows when he can normally lie flat and he was having worsening dyspnea with minimal exertion and even at rest at times.  He had some left-sided chest discomfort that did not radiate.  The chest pain was intermittent, sharp, located over his central left chest.  Nothing reproduced the pain and nothing made it better.  He denies change in his diet, drinking more fluids than usual.  He denies any illicit substance use.  He denies missing any medication doses recently.  He states about a month ago his primary care provider increased his Lasix dose to 2 tabs daily (40 mg total).  He denies recent illness, fever, chills, worsening of his chronic back pain, nausea, vomiting, diarrhea, decrease in urinary output.  He feels as though his chronic cough is worsened and is now more productive with white phlegm.  He also reports a hard mass in his testicle noting that it feels as though he has 3.  ED Course: Patient presented with shortness of  breath and significant lower extremity swelling.  CBC without leukocytosis or anemia, creatinine up to 1.66 which is near baseline.  Troponin 43, repeat 37.  COVID and flu negative.  BNP 3768.  Chest x-ray without pleural effusions.  Responded well to breathing treatment as well as 40 Lasix IV.  Meds: only change is 2 lasix now Current Meds  Medication Sig   albuterol (PROVENTIL) (2.5 MG/3ML) 0.083% nebulizer solution Take 2.5 mg by nebulization every 6 (six) hours as needed for shortness of breath or wheezing.   amiodarone (PACERONE) 200 MG tablet Take 1 tablet (200 mg total) by mouth daily.   bismuth subsalicylate (PEPTO BISMOL) 262 MG/15ML suspension Take 30 mLs by mouth every 6 (six) hours as needed for indigestion.   digoxin (LANOXIN) 0.125 MG tablet TAKE 1 TABLET (0.125 MG TOTAL) BY MOUTH DAILY.   eplerenone (INSPRA) 25 MG tablet TAKE 1 TABLET (25 MG TOTAL) BY MOUTH DAILY.   furosemide (LASIX) 20 MG tablet Take 1 tablet (20 mg total) by mouth daily.   JARDIANCE 10 MG TABS tablet TAKE 1 TABLET (10 MG TOTAL) BY MOUTH DAILY. (Patient taking differently: Take 10 mg by mouth daily.)   rosuvastatin (CRESTOR) 5 MG tablet TAKE 1 TABLET (5 MG TOTAL) BY MOUTH DAILY. (Patient taking differently: Take 5 mg by mouth daily.)   sacubitril-valsartan (ENTRESTO) 24-26 MG Take 1  tablet by mouth 2 (two) times daily.   tamsulosin (FLOMAX) 0.4 MG CAPS capsule TAKE ONE CAPSULE BY MOUTH ONCE DAILY AFTER SUPPER (Patient taking differently: Take 0.4 mg by mouth daily.)    Past Medical History:  Diagnosis Date   Arthritis    "feels like it in my legs" (09/20/2014)   Borderline type 2 diabetes mellitus    patient denies   CHF (congestive heart failure) ([place])    Dysrhythmia    Hypertension    NSVT (nonsustained ventricular tachycardia) 09/06/2016    Past Surgical History:  Procedure Laterality Date   CARDIAC CATHETERIZATION N/A 08/27/2016   Procedure: Right/Left Heart Cath and Coronary Angiography;  Surgeon:  Larey Dresser, MD;  Location: [facility];  Service: Cardiovascular;  Laterality: N/A;   COLONOSCOPY     CYSTECTOMY Right    "back of my shoulder"   RIGHT/LEFT HEART CATH AND CORONARY ANGIOGRAPHY N/A 12/04/2020   Procedure: RIGHT/LEFT HEART CATH AND CORONARY ANGIOGRAPHY;  Surgeon: Larey Dresser, MD;  Location: [place] [facility];  Service: Cardiovascular;  Laterality: N/A;   TONSILLECTOMY      Social:  Lives With:Daughter. Occupation:On disability; used to move furniture for 15 years. Support: Family in the area Level of Function: ADLs/IADLs but progressively getting more difficult with shortness of breath. [place] Substances:No tobacco for 3 weeks (1/2ppd since age 7), no longer marijuana (a joint or blunt every day last use 1 week ago), now drinks a beer or so per week but did drink liquor 1/5 every other day years ago, last cocaine use a month ago.  No other illicit substances  Family History:  Mom: MI, died at age 34 Dad: Unknown Siblings:None to report.  Allergies: Allergies as of 10/04/2021   (No Known Allergies)   Review of Systems: A complete ROS was negative except as per HPI.   OBJECTIVE:   Physical Exam: Blood pressure (!) 120/95, pulse 81, temperature 97.7 F (36.5 C), temperature source Oral, resp. rate (!) 21, height 6' (1.829 m), weight 86.2 kg, SpO2 94 %.  Constitutional: Elderly gentleman, no acute distress HENT: normocephalic atraumatic Eyes: conjunctiva non-erythematous Neck: supple.  No JVD appreciated Cardiovascular: regular rate and rhythm, no m/r/g.  3+ pitting edema Pulmonary/Chest: Normal work of breathing on room air, bilateral diffuse wheezing and rales Abdominal: soft, non-tender, non-distended MSK: normal bulk and tone Neurological: alert & oriented x 3 GU: Left inguinal hernia, reducible.  Nontender. Skin: warm and dry Psych: Normal mood and thought process  Chaperone present during patient's GU  exam  Labs: CBC    Component Value Date/Time   WBC 7.1 10/04/2021 1153   RBC 5.04 10/04/2021 1153   HGB 14.3 10/04/2021 1153   HGB 14.1 03/09/2019 1100   HCT 47.3 10/04/2021 1153   HCT 42.6 03/09/2019 1100   PLT 188 10/04/2021 1153   PLT 192 03/09/2019 1100   MCV 93.8 10/04/2021 1153   MCV 86 03/09/2019 1100   MCH 28.4 10/04/2021 1153   MCHC 30.2 10/04/2021 1153   RDW 16.2 (H) 10/04/2021 1153   RDW 13.5 03/09/2019 1100   LYMPHSABS 0.7 06/10/2019 1620   LYMPHSABS 1.3 03/09/2019 1100   MONOABS 1.9 (H) 06/10/2019 1620   EOSABS 0.0 06/10/2019 1620   EOSABS 0.3 03/09/2019 1100   BASOSABS 0.0 06/10/2019 1620   BASOSABS 0.1 03/09/2019 1100     CMP     Component Value Date/Time   NA 141 10/04/2021 1153   NA 142 08/01/2020 1046  K 3.8 10/04/2021 1153   CL 109 10/04/2021 1153   CO2 20 (L) 10/04/2021 1153   GLUCOSE 125 (H) 10/04/2021 1153   BUN 22 10/04/2021 1153   BUN 18 08/01/2020 1046   CREATININE 1.66 (H) 10/04/2021 1153   CREATININE 1.21 06/19/2012 1346   CALCIUM 8.8 (L) 10/04/2021 1153   PROT 6.9 12/14/2020 0932   PROT 7.0 03/09/2019 1100   ALBUMIN 3.1 (L) 12/14/2020 0932   ALBUMIN 4.2 03/09/2019 1100   AST 35 12/14/2020 0932   ALT 30 12/14/2020 0932   ALKPHOS 131 (H) 12/14/2020 0932   BILITOT 0.3 12/14/2020 0932   BILITOT 1.1 03/09/2019 1100   GFRNONAA 45 (L) 10/04/2021 1153   GFRNONAA 66 06/19/2012 1346   GFRAA 59 (L) 08/01/2020 1046   GFRAA 76 06/19/2012 1346    Imaging: DG Chest 2 View  Result Date: 10/04/2021 CLINICAL DATA:  Chest pain and shortness of breath for 2 days. Lower extremity swelling. EXAM: CHEST - 2 VIEW COMPARISON:  10/01/2021 FINDINGS: Moderate to severe cardiac enlargement is stable. Tortuosity of thoracic aorta is unchanged. Mild scarring in left lung base is again seen. No evidence of pulmonary edema or other acute infiltrate. No evidence pleural effusion. IMPRESSION: Stable cardiomegaly and mild left basilar scarring. No active lung  disease. Electronically Signed   By: Marlaine Hind M.D.   On: 10/04/2021 12:15    EKG: personally reviewed my interpretation is sinus tachycardia rate of 100 bpm.  Left axis deviation, left bundle branch block.  PVCs present.  Nonspecific ST wave changes present 2 3 aVF.  ST changes new compared to prior EKG from 12/04/2018  ASSESSMENT & PLAN:   Assessment & Plan by Problem: Principal Problem:   Acute exacerbation of CHF (congestive heart failure) ([place])  Edward Mullen is a 66 y.o. with pertinent PMH of  hypertension, diabetes, chronic systolic heart failure, history of NSVT, history of polysubstance abuse who presented with shortness of breath, lower extremity swelling, orthopnea, dyspnea on exertion and admitted for heart failure exacerbation on hospital day 0  Acute on chronic systolic heart failure exacerbation Nonischemic cardiomyopathy CAD Last echocardiogram 11/29/2020 20-25 percent EF with severely decreased function.  Left ventricle severely dilated.  Moderate concentric left ventricular hypertrophy.  Last right and left heart catheterization 12/04/2020, first RPL lesion 80% stenosed, mid LAD 25% stenosed.  Normal filling pressures.  Preserved cardiac output.  80% stenosis in branch of the PLV.  Does not appear as though he seen his cardiologist in a year.  His current symptoms of dyspnea on exertion, orthopnea, lower extremity edema, hypervolemia on exam, and elevated BNP of over 3000 consistent with heart failure exacerbation.  No inciting event indicated, no change in diet, he has not missed any medications or any changes in medications recently.  Nonspecific ST wave changes on EKG.  Troponins relatively flat 40, 30.  He denies recent cocaine use. Will diurese and repeat echocardiogram.  If worsening of EF or any isolated wall motion abnormalities we will consult heart failure team. Of note, dry weight during last admission after CHF exacerbation, 85 kg. Admission weight of 95 kg. NYHA  Class III-IV -IV Lasix 80 mg twice daily -Restart home Jardiance, digoxin -Continue amiodarone for history of NSVT/PVCs -Continue 80 mg aspirin daily -Hold home eplerenone, Entresto -Echocardiogram pending -Mag and potassium daily -Daily weights -Strict I's and O's  Wheezing Patient with longstanding history of tobacco use.  He had some wheezing on exam that he notes improved with DuoNebs treatment.  I suspect he has some underlying COPD, does not appear to have PFTs in the chart.  It looks like they have been ordered but not done.  If no improvement in wheezing with breathing treatments and albuterol, can consider steroid for COPD exacerbation.  Also suspect this is contributing to his shortness of breath. -Continue DuoNebs and albuterol -Follow-up outpatient PFTs  Prior episode of syncope Patient with prior admission for syncope thought to be secondary to VT.  No ICD given because of history of subs abuse however he did have a LifeVest placed and it was removed after his echocardiogram.  Low suspicion that underlying exacerbation is secondary to any type of arrhythmia.  We will continue to monitor on telemetry  Hypertension Blood pressure of 121/99.  With acute exacerbation we will hold Entresto for now.  Can consider restarting tomorrow. -Continue IV diuretic therapy  CKD stage IIIa Creatinine of 1.66, GFR history of 45-44.  Currently at baseline.  Continue to monitor with diuresis -Daily BMP  Tobacco use disorder Polysubstance use Denies recent substance use.  Per patient last cocaine use a month ago.  Quit smoking cigarettes a month ago.  Denies alcohol use recently but in the past drank 1/5 of liquor daily -UDS pending  Suspected inguinal hernia Patient states he has felt a mass in his testicles for some time.  In the past he has been worked up and was noted to have a hydrocele.  On my exam it felt as though he has an inguinal hernia that is reducible.  Continue to follow  outpatient basis  Diet: Heart Healthy VTE: Enoxaparin IVF: None,None Code: Full  Prior to Admission Living Arrangement: Home, living family Anticipated Discharge Location: Home Barriers to Discharge: Further work-up for heart failure exacerbation  Dispo: Admit patient to Inpatient with expected length of stay greater than 2 midnights.  Signed: Riesa Pope, MD Internal Medicine Resident PGY-2 Pager: [phone]  10/04/2021, 6:21 PM

## 2021-10-04 NOTE — ED Notes (Signed)
Pt stated he just stopped smoking two weeks ago. Pt states he was smoking one cigarette a day.

## 2021-10-04 NOTE — ED Provider Triage Note (Signed)
Emergency Medicine Provider Triage Evaluation Note  Edward Mullen , a 66 y.o. male  was evaluated in triage.  Pt complains of shortness of breath and bilateral lower extremity swelling. States that he has had increasing SOB for 3 days with intermittent short episodes of chest pain. States he has increasing in BLE swelling overnight to this morning. States he has not taken diuretic in 2 days. Denies new cough, fevers.  Review of Systems  Positive: See above Negative:   Physical Exam  BP 127/86 (BP Location: Left Arm)    Pulse 97    Temp 97.7 F (36.5 C) (Oral)    Resp (!) 22    Ht 6' (1.829 m)    Wt 86.2 kg    SpO2 99%    BMI 25.77 kg/m  Gen:   Awake and alert Resp:  Tachypneic, decreased bilateral lower lobe lung sounds MSK:   Moves extremities without difficulty  Other:  Bilateral 3+ LE pitting edema   Medical Decision Making  Medically screening exam initiated at 11:55 AM.  Appropriate orders placed.  Edward Mullen was informed that the remainder of the evaluation will be completed by another provider, this initial triage assessment does not replace that evaluation, and the importance of remaining in the ED until their evaluation is complete.     Mickie Hillier, PA-C 10/04/21 1157

## 2021-10-05 ENCOUNTER — Inpatient Hospital Stay (HOSPITAL_COMMUNITY): Payer: Medicare (Managed Care)

## 2021-10-05 ENCOUNTER — Inpatient Hospital Stay: Payer: Self-pay

## 2021-10-05 ENCOUNTER — Encounter (HOSPITAL_COMMUNITY): Payer: Self-pay | Admitting: Internal Medicine

## 2021-10-05 DIAGNOSIS — E1122 Type 2 diabetes mellitus with diabetic chronic kidney disease: Secondary | ICD-10-CM | POA: Diagnosis present

## 2021-10-05 DIAGNOSIS — R042 Hemoptysis: Secondary | ICD-10-CM | POA: Diagnosis not present

## 2021-10-05 DIAGNOSIS — I5023 Acute on chronic systolic (congestive) heart failure: Secondary | ICD-10-CM

## 2021-10-05 DIAGNOSIS — E119 Type 2 diabetes mellitus without complications: Secondary | ICD-10-CM

## 2021-10-05 DIAGNOSIS — I5043 Acute on chronic combined systolic (congestive) and diastolic (congestive) heart failure: Secondary | ICD-10-CM

## 2021-10-05 DIAGNOSIS — I130 Hypertensive heart and chronic kidney disease with heart failure and stage 1 through stage 4 chronic kidney disease, or unspecified chronic kidney disease: Principal | ICD-10-CM

## 2021-10-05 HISTORY — DX: Type 2 diabetes mellitus without complications: E11.9

## 2021-10-05 LAB — BASIC METABOLIC PANEL
Anion gap: 10 (ref 5–15)
BUN: 31 mg/dL — ABNORMAL HIGH (ref 8–23)
CO2: 25 mmol/L (ref 22–32)
Calcium: 8.7 mg/dL — ABNORMAL LOW (ref 8.9–10.3)
Chloride: 104 mmol/L (ref 98–111)
Creatinine, Ser: 2.02 mg/dL — ABNORMAL HIGH (ref 0.61–1.24)
GFR, Estimated: 35 mL/min — ABNORMAL LOW (ref >60)
Glucose, Bld: 111 mg/dL — ABNORMAL HIGH (ref 70–99)
Potassium: 4.1 mmol/L (ref 3.5–5.1)
Sodium: 139 mmol/L (ref 135–145)

## 2021-10-05 LAB — COOXEMETRY PANEL
Carboxyhemoglobin: 1.7 % — ABNORMAL HIGH (ref 0.5–1.5)
Carboxyhemoglobin: 0.8 % (ref 0.5–1.5)
Methemoglobin: <0.7 % (ref 0.0–1.5)
Methemoglobin: 1.1 % (ref 0.0–1.5)
O2 Saturation: 84 %
O2 Saturation: 43.1 %
Total hemoglobin: 14.3 g/dL (ref 12.0–16.0)
Total hemoglobin: 13.7 g/dL (ref 12.0–16.0)

## 2021-10-05 LAB — ECHOCARDIOGRAM COMPLETE
AR max vel: 3.04 cm2
AV Peak grad: 8.3 mmHg
Ao pk vel: 1.44 m/s
Area-P 1/2: 4.04 cm2
Calc EF: 27.5 %
Height: 72 in
MV M vel: 4.12 m/s
MV Peak grad: 67.7 mmHg
P 1/2 time: 450 msec
S' Lateral: 6.8 cm
Single Plane A2C EF: 27.1 %
Single Plane A4C EF: 27.8 %
Weight: 3353.6 oz

## 2021-10-05 LAB — CBC WITH DIFFERENTIAL/PLATELET
Abs Immature Granulocytes: 0.01 10*3/uL (ref 0.00–0.07)
Basophils Absolute: 0 10*3/uL (ref 0.0–0.1)
Basophils Relative: 0 %
Eosinophils Absolute: 0 10*3/uL (ref 0.0–0.5)
Eosinophils Relative: 0 %
HCT: 41 % (ref 39.0–52.0)
Hemoglobin: 12.9 g/dL — ABNORMAL LOW (ref 13.0–17.0)
Immature Granulocytes: 0 %
Lymphocytes Relative: 10 %
Lymphs Abs: 0.5 10*3/uL — ABNORMAL LOW (ref 0.7–4.0)
MCH: 28.2 pg (ref 26.0–34.0)
MCHC: 31.5 g/dL (ref 30.0–36.0)
MCV: 89.7 fL (ref 80.0–100.0)
Monocytes Absolute: 0.2 10*3/uL (ref 0.1–1.0)
Monocytes Relative: 4 %
Neutro Abs: 4.5 10*3/uL (ref 1.7–7.7)
Neutrophils Relative %: 86 %
Platelets: 144 10*3/uL — ABNORMAL LOW (ref 150–400)
RBC: 4.57 MIL/uL (ref 4.22–5.81)
RDW: 15.8 % — ABNORMAL HIGH (ref 11.5–15.5)
WBC: 5.2 10*3/uL (ref 4.0–10.5)
nRBC: 0 % (ref 0.0–0.2)

## 2021-10-05 LAB — MAGNESIUM: Magnesium: 1.7 mg/dL (ref 1.7–2.4)

## 2021-10-05 LAB — PROCALCITONIN: Procalcitonin: <0.1 ng/mL

## 2021-10-05 LAB — COMPREHENSIVE METABOLIC PANEL
ALT: 29 U/L (ref 0–44)
AST: 36 U/L (ref 15–41)
Albumin: 2.8 g/dL — ABNORMAL LOW (ref 3.5–5.0)
Alkaline Phosphatase: 63 U/L (ref 38–126)
Anion gap: 11 (ref 5–15)
BUN: 28 mg/dL — ABNORMAL HIGH (ref 8–23)
CO2: 20 mmol/L — ABNORMAL LOW (ref 22–32)
Calcium: 8.5 mg/dL — ABNORMAL LOW (ref 8.9–10.3)
Chloride: 109 mmol/L (ref 98–111)
Creatinine, Ser: 1.93 mg/dL — ABNORMAL HIGH (ref 0.61–1.24)
GFR, Estimated: 37 mL/min — ABNORMAL LOW (ref >60)
Glucose, Bld: 115 mg/dL — ABNORMAL HIGH (ref 70–99)
Potassium: 4 mmol/L (ref 3.5–5.1)
Sodium: 140 mmol/L (ref 135–145)
Total Bilirubin: 0.9 mg/dL (ref 0.3–1.2)
Total Protein: 5.5 g/dL — ABNORMAL LOW (ref 6.5–8.1)

## 2021-10-05 LAB — LACTIC ACID, PLASMA
Lactic Acid, Venous: 2.5 mmol/L (ref 0.5–1.9)
Lactic Acid, Venous: 1.7 mmol/L (ref 0.5–1.9)

## 2021-10-05 MED ORDER — CHLORHEXIDINE GLUCONATE CLOTH 2 % EX PADS
6.0000 | MEDICATED_PAD | Freq: Every day | CUTANEOUS | Status: DC
  Administered 2021-10-06 – 2021-10-11 (×4): 6 via TOPICAL

## 2021-10-05 MED ORDER — MILRINONE LACTATE IN DEXTROSE 20-5 MG/100ML-% IV SOLN
0.2500 ug/kg/min | INTRAVENOUS | Status: DC
Start: 2021-10-05 — End: 2021-10-08
  Administered 2021-10-05 – 2021-10-08 (×4): 0.25 ug/kg/min via INTRAVENOUS
  Filled 2021-10-05 (×5): qty 100

## 2021-10-05 MED ORDER — MAGNESIUM SULFATE 2 GM/50ML IV SOLN
2.0000 g | Freq: Once | INTRAVENOUS | Status: AC
  Administered 2021-10-05: 2 g via INTRAVENOUS
  Filled 2021-10-05: qty 50

## 2021-10-05 MED ORDER — SODIUM CHLORIDE 0.9% FLUSH
10.0000 mL | Freq: Two times a day (BID) | INTRAVENOUS | Status: DC
  Administered 2021-10-06 – 2021-10-07 (×3): 10 mL
  Administered 2021-10-07: 20 mL
  Administered 2021-10-08: 10 mL
  Administered 2021-10-09: 40 mL
  Administered 2021-10-09 – 2021-10-10 (×2): 10 mL
  Administered 2021-10-11: 5 mL

## 2021-10-05 MED ORDER — AMIODARONE HCL IN DEXTROSE 360-4.14 MG/200ML-% IV SOLN
60.0000 mg/h | INTRAVENOUS | Status: AC
  Administered 2021-10-05 (×2): 60 mg/h via INTRAVENOUS
  Filled 2021-10-05: qty 200

## 2021-10-05 MED ORDER — IOHEXOL 300 MG/ML  SOLN
70.0000 mL | Freq: Once | INTRAMUSCULAR | Status: AC | PRN
  Administered 2021-10-05: 70 mL via INTRAVENOUS

## 2021-10-05 MED ORDER — SODIUM CHLORIDE 0.9% FLUSH
10.0000 mL | INTRAVENOUS | Status: DC | PRN
  Administered 2021-10-05: 10 mL

## 2021-10-05 MED ORDER — SODIUM CHLORIDE 0.9 % IV SOLN
1.0000 g | INTRAVENOUS | Status: AC
  Administered 2021-10-05 – 2021-10-09 (×5): 1 g via INTRAVENOUS
  Filled 2021-10-05 (×5): qty 10

## 2021-10-05 MED ORDER — AMIODARONE LOAD VIA INFUSION
150.0000 mg | Freq: Once | INTRAVENOUS | Status: AC
  Administered 2021-10-05: 150 mg via INTRAVENOUS
  Filled 2021-10-05: qty 83.34

## 2021-10-05 MED ORDER — AMIODARONE HCL IN DEXTROSE 360-4.14 MG/200ML-% IV SOLN
30.0000 mg/h | INTRAVENOUS | Status: DC
  Administered 2021-10-06 – 2021-10-11 (×11): 30 mg/h via INTRAVENOUS
  Filled 2021-10-05 (×12): qty 200

## 2021-10-05 NOTE — Progress Notes (Addendum)
HD#1 SUBJECTIVE:  Patient Summary: Edward Mullen is a 66 y.o. with pertinent PMH of  hypertension, diabetes, chronic systolic heart failure, history of NSVT, history of polysubstance abuse who presented with shortness of breath, lower extremity swelling, orthopnea, dyspnea on exertion and admitted for heart failure exacerbation.   Overnight Events: None  Interim History: Patient reports feeling significantly better this morning. His breathing is improved and he is able to ambulate with the walker to the bathroom without significant dyspnea. He was able to lay more flat than he has been over the last week. He reports frequent urination. His legs feel less tight and he can see a difference in LE edema. He had a episode of bloody sputum after coughing prior to [place] rounding on him.  OBJECTIVE:  Vital Signs: Vitals:   10/04/21 2154 10/05/21 0035 10/05/21 0312 10/05/21 0500  BP:  (!) 124/92  (!) 125/93  Pulse:  87  75  Resp:  18  20  Temp:  98 F (36.7 C)  98 F (36.7 C)  TempSrc:    Oral  SpO2: 95% 94% 93% 100%  Weight:    95.1 kg  Height:       Supplemental O2: Room Air SpO2: 100 % O2 Flow Rate (L/min): 3 L/min  Filed Weights   10/04/21 2100 10/04/21 2110 10/05/21 0500  Weight: 95 kg 95 kg 95.1 kg     Intake/Output Summary (Last 24 hours) at 10/05/2021 7425 Last data filed at 10/04/2021 1802 Gross per 24 hour  Intake --  Output 600 ml  Net -600 ml   Net IO Since Admission: -600 mL [10/05/21 0729]  Physical Exam: Constitutional:Chronically ill appearing gentleman resting comfortably in bed. No acute distress. Cardio:Regular rate and rhythm. No murmurs, rubs, gallops. No JVD noted. Pulm:Bilateral mild expiratory wheezing. Appropriate oxygen saturations on room air. ZDG:LOVFIEPPI 2+ pitting edema. Skin:Distal extremities cool, dry. Neuro:Alert and oriented x3. No focal deficit noted. Psych:Normal mood and affect.     Patient Lines/Drains/Airways Status     Active  Line/Drains/Airways     Name Placement date Placement time Site Days   Peripheral IV 10/04/21 20 G Anterior;Left;Proximal Forearm 10/04/21  1426  Forearm  1   Incision (Closed) 06/08/19 Back Other (Comment) 06/08/19  1358  -- 850             ASSESSMENT/PLAN:  Assessment: Principal Problem:   Acute exacerbation of CHF (congestive heart failure) (HCC)   Plan: Acute on chronic systolic heart failure exacerbation, NYHA IV Nonischemic cardiomyopathy CAD Patient reports increased urination with initiation of IV Lasix at admission. With further questioning he does say that he may have forgotten to take his medications which contributed to this exacerbation. Digoxin level was <0.2. We obtained an echocardiogram this morning which showed basal to mid inferoseptal and inferior akinesis,alse tendon, LV EF20 to 25%, severely decreased function, global hypokinesis, internal cavity size was moderately to severely dilated, mild concentric left ventricular hypertrophy, consistent with Grade II diastolic dysfunction; RV systolic function is mildly reduced, moderately elevated pulmonary artery systolic pressure; severely dilated LA and RA. Wall motion abnormalities are new from last echocardiogram 11/2020. -Cardiology consulted -Continue IV Lasix 80 mg twice daily -Restart home Jardiance, digoxin -Continue amiodarone for history of NSVT/PVCs -Continue 80 mg aspirin daily -Hold home eplerenone, Entresto -Trend BMP, mag -Daily weights -Strict I's and O's  Wheezing Community acquired pneumonia Wheezing and dyspnea have markedly improved. He did have an episode of cough with frankly bloody sputum this morning  which is new. CT chest obtained which showed new patchy infiltrates in the right middle lobe and left lower lobe suggesting multifocal pneumonia, small left pleural effusion, coronary artery calcifications are seen, enlarged heart. -Start ceftriaxone 1 g daily -Procal ordered -Continue DuoNebs  and albuterol -Follow-up outpatient PFTs   Hypertension Normotensive at this time. Currently holding Entresto 2/2 acute heart failure exacerbation. -Continue IV diuretic therapy   CKD stage IIIa Creatinine of 1.93, roughly baseline.  We are aggressively diuresing at this time.  -Daily BMP  Suspected inguinal hernia -CTM  Prior episode of syncope -CTM   Tobacco use disorder Polysubstance use -UDS negative -Encourage continued cessation  Best Practice: Diet: Cardiac diet IVF: Fluids: IV push only, no IV fluids VTE: enoxaparin (LOVENOX) injection 40 mg Start: 10/04/21 1815 Code: Full AB: None DISPO: Anticipated discharge in 2-4 days to Home pending Medical stability.  Signature: Farrel Gordon, D.O.  Internal Medicine Resident, PGY-1 [facility]  Pager: [phone] 7:29 AM, 10/05/2021   Please contact the on call pager after 5 pm and on weekends at [phone].

## 2021-10-05 NOTE — Progress Notes (Signed)
Pt coughing up blood and MD is aware. CXR ordered and completed.

## 2021-10-05 NOTE — Consult Note (Addendum)
Advanced Heart Failure Team Consult Note   Primary Physician: Harvie Heck, MD PCP-Cardiologist:  Dr. Aundra Dubin  Reason for Consultation: acute on chronic biventricular heart failure  HPI:    Edward Mullen is seen today for evaluation of acute on chronic biventricular heart failure at the request of Dr. Heber Walkerville, Internal Medicine.   Edward Mullen is a 66 y.o. male with history of HTN, DM, chronic systolic CHF, and polysubstance abuse.   Admitted 08/23/16 with acute systolic CHF in setting of substance abuse. Echo was done, showing EF 15-20%. Diuresed with IV lasix and meds adjusted as tolerated.  UDS + for cocaine on admission. Cardiac cath showed coronary disease but not significant enough to explain cardiomyopathy. Down 23 lbs from admission weight with diuresis.  Discharge weight 193 lbs.   He was positive for cocaine again in 2/18.     He was admitted with syncope in 5/18 after using cocaine.  He was noted to have NSVT on telemetry. Syncope suspected due to VT, no ICD given active substance abuse but had Lifevest placed. EF 50% on 8/18 echo, so Lifevest subsequently removed.   He started using cocaine again and stopped his cardiac medications. In 4/22, he was admitted with acute on chronic systolic CHF.  Frequent NSVT was noted and amiodarone was started.  He had echo showing EF 20-25% with normal RV.  LHC showed 80% PLV stenosis, managed medically.    Lost to f/u in the [facility]. Not seen back since 5/22.   Now admitted w/ a/c CHF. Presented w/ progressive SOB, LEE and wt gain. BNP 3768 on admit. CXR w/ cardiomegaly and mild pulmonary edema. SCr 1.66 on admit, c/w prior baseline. Hs trop 39>>43. EKG NSR w/ LBBB QRS 146 ms. UDS negative.  COVID and flu negative. Says he has been taking meds but compliance questionable. Dig level undetectable.   He was admitted and started on IV lasix. 40 mg x 1 on admit, only 600 cc in UOP charted. Today's morning labs showed bump in SCr from  1.66>>1.93. CO2 20. This morning's am Lasix dose increased to 80 mg. UOP picking up, 1.4L out thus far. BP has been controlled. Echo shows biventricular dysfunction. LVEF severely reduced 20-25%, GIIDD, RV mildly reduced, severe BAE, mild-mod MR.   Of note, chest CT was ordered today after development of hemoptysis. This showed no intraluminal filling defects in the central pulmonary artery branches. There are new patchy infiltrates in the right middle lobe and left lower lobe suggesting multifocal pneumonia. Small left pleural effusion. Abdominal edema/ anasarca also noted.   He has been started on abx therapy w/ ceftriaxone by primary team but has been afebrile. WBC normal, 5.2. PCT level pending.   Also w/ multiple runs of VT. Longest was 28 beats. Asymptomatic. Mg 1.7. K 4.0   AHF team consulted for HF management. No current resting dyspnea. Marked volume overload on exam. Complains of sharp CP from chest radiating to back.     Echo 10/05/21 1. Basal to mid inferoseptal and inferior akinesis. False tendon. Left  ventricular ejection fraction, by estimation, is 20 to 25%. The left  ventricle has severely decreased function. The left ventricle demonstrates  global hypokinesis. The left  ventricular internal cavity size was moderately to severely dilated. There  is mild concentric left ventricular hypertrophy. Left ventricular  diastolic parameters are consistent with Grade II diastolic dysfunction  (pseudonormalization).   2. Right ventricular systolic function is mildly reduced. The right  ventricular size  is normal. There is moderately elevated pulmonary artery  systolic pressure.   3. Left atrial size was severely dilated.   4. Right atrial size was severely dilated.   5. The mitral valve is normal in structure. Trivial mitral valve  regurgitation. No evidence of mitral stenosis.   6. Tricuspid valve regurgitation is mild to moderate.   7. The aortic valve is tricuspid. Aortic valve  regurgitation is mild to  moderate. No aortic stenosis is present.   8. Aortic dilatation noted. There is mild dilatation of the aortic root,  measuring 37 mm. There is mild dilatation of the ascending aorta,  measuring 40 mm.   9. The inferior vena cava is dilated in size with <50% respiratory  variability, suggesting right atrial pressure of 15 mmHg.   Review of Systems: [y] = yes, [ ]  = no   General: Weight gain [Y ]; Weight loss [ ] ; Anorexia [ ] ; Fatigue [ ] ; Fever [ ] ; Chills [ ] ; Weakness [ ]   Cardiac: Chest pain/pressure [ ] ; Resting SOB [ Y]; Exertional SOB [ Y]; Orthopnea [Y ]; Pedal Edema [ Y]; Palpitations [ ] ; Syncope [ ] ; Presyncope [ ] ; Paroxysmal nocturnal dyspnea[ ]   Pulmonary: Cough [ Y]; Wheezing[ ] ; Hemoptysis[ Y]; Sputum [ ] ; Snoring [ ]   GI: Vomiting[ ] ; Dysphagia[ ] ; Melena[ ] ; Hematochezia [ ] ; Heartburn[ ] ; Abdominal pain [ ] ; Constipation [ ] ; Diarrhea [ ] ; BRBPR [ ]   GU: Hematuria[ ] ; Dysuria [ ] ; Nocturia[ ]   Vascular: Pain in legs with walking [ ] ; Pain in feet with lying flat [ ] ; Non-healing sores [ ] ; Stroke [ ] ; TIA [ ] ; Slurred speech [ ] ;  Neuro: Headaches[ ] ; Vertigo[ ] ; Seizures[ ] ; Paresthesias[ ] ;Blurred vision [ ] ; Diplopia [ ] ; Vision changes [ ]   Ortho/Skin: Arthritis [ ] ; Joint pain [ ] ; Muscle pain [ ] ; Joint swelling [ ] ; Back Pain [ ] ; Rash [ ]   Psych: Depression[ ] ; Anxiety[ ]   Heme: Bleeding problems [ ] ; Clotting disorders [ ] ; Anemia [ ]   Endocrine: Diabetes [ ] ; Thyroid dysfunction[ ]   Home Medications Prior to Admission medications   Medication Sig Start Date End Date Taking? Authorizing Provider  albuterol (PROVENTIL) (2.5 MG/3ML) 0.083% nebulizer solution Take 2.5 mg by nebulization every 6 (six) hours as needed for shortness of breath or wheezing. 10/01/21  Yes [provider]  amiodarone (PACERONE) 200 MG tablet Take 1 tablet (200 mg total) by mouth daily. 12/26/20  Yes Larey Dresser, MD  bismuth subsalicylate (PEPTO BISMOL)  262 MG/15ML suspension Take 30 mLs by mouth every 6 (six) hours as needed for indigestion.   Yes [provider]  digoxin (LANOXIN) 0.125 MG tablet TAKE 1 TABLET (0.125 MG TOTAL) BY MOUTH DAILY. 04/26/21  Yes Larey Dresser, MD  eplerenone (INSPRA) 25 MG tablet TAKE 1 TABLET (25 MG TOTAL) BY MOUTH DAILY. 04/26/21  Yes Larey Dresser, MD  furosemide (LASIX) 20 MG tablet Take 1 tablet (20 mg total) by mouth daily. 12/26/20 12/26/21 Yes Larey Dresser, MD  JARDIANCE 10 MG TABS tablet TAKE 1 TABLET (10 MG TOTAL) BY MOUTH DAILY. Patient taking differently: Take 10 mg by mouth daily. 02/20/21  Yes Larey Dresser, MD  rosuvastatin (CRESTOR) 5 MG tablet TAKE 1 TABLET (5 MG TOTAL) BY MOUTH DAILY. Patient taking differently: Take 5 mg by mouth daily. 12/26/20  Yes Larey Dresser, MD  sacubitril-valsartan (ENTRESTO) 24-26 MG Take 1 tablet by mouth 2 (two) times daily. 12/26/20  Yes Larey Dresser, MD  tamsulosin (FLOMAX) 0.4 MG CAPS capsule TAKE ONE CAPSULE BY MOUTH ONCE DAILY AFTER SUPPER Patient taking differently: Take 0.4 mg by mouth daily. 02/20/21  Yes Katsadouros, Vasilios, MD  albuterol (VENTOLIN HFA) 108 (90 Base) MCG/ACT inhaler INHALE 1 TO 2 PUFFS BY MOUTH EVERY 6 HOURS AS NEEDED FOR WHEEZING OR SHORTNESS OF BREATH Patient not taking: Reported on 10/04/2021 07/30/21   Harvie Heck, MD  Blood Pressure Monitor DEVI Please use to monitor blood pressure as directed.  ICD: I10 (hypertension) Patient taking differently: Please use to monitor blood pressure as directed.  ICD: I10 (hypertension) 08/03/20   Jean Rosenthal, MD    Past Medical History: Past Medical History:  Diagnosis Date   Arthritis    "feels like it in my legs" (09/20/2014)   Borderline type 2 diabetes mellitus    patient denies   CHF (congestive heart failure) ([place])    Dysrhythmia    Hypertension    NSVT (nonsustained ventricular tachycardia) 09/06/2016    Past Surgical History: Past Surgical History:  Procedure  Laterality Date   CARDIAC CATHETERIZATION N/A 08/27/2016   Procedure: Right/Left Heart Cath and Coronary Angiography;  Surgeon: Larey Dresser, MD;  Location: [place] [facility];  Service: Cardiovascular;  Laterality: N/A;   COLONOSCOPY     CYSTECTOMY Right    "back of my shoulder"   RIGHT/LEFT HEART CATH AND CORONARY ANGIOGRAPHY N/A 12/04/2020   Procedure: RIGHT/LEFT HEART CATH AND CORONARY ANGIOGRAPHY;  Surgeon: Larey Dresser, MD;  Location: [place] [facility];  Service: Cardiovascular;  Laterality: N/A;   TONSILLECTOMY      Family History: Family History  Problem Relation Age of Onset   Stroke Mother 57   Heart disease Father 40   Colon cancer Neg Hx    Colon polyps Neg Hx    Esophageal cancer Neg Hx    Stomach cancer Neg Hx    Rectal cancer Neg Hx     Social History: Social History   Socioeconomic History   Marital status: Married    Spouse name: Not on file   Number of children: Not on file   Years of education: Not on file   Highest education level: Not on file  Occupational History   Occupation: Engineer, manufacturing systems    Comment: has not been able to work steadily for the last year or two  Tobacco Use   Smoking status: Every Day    Packs/day: 0.10    Years: 48.00    Pack years: 4.80    Types: Cigarettes   Smokeless tobacco: Never   Tobacco comments:    cutting back 2  per day  Vaping Use   Vaping Use: Never used  Substance and Sexual Activity   Alcohol use: Yes    Alcohol/week: 1.0 standard drink    Types: 1 Cans of beer per week    Comment: occasionally   Drug use: Yes    Types: Marijuana    Comment: daily 4 times last 2 weeks ago as of 05/31/2019   Sexual activity: Yes    Birth control/protection: None  Other Topics Concern   Not on file  Social History Narrative   Not on file   Social Determinants of Health   Financial Resource Strain: High Risk   Difficulty of Paying Living Expenses: Hard  Food Insecurity: No Food Insecurity   Worried About  Running Out of Food in the Last Year: Never true   Ran Out of  Food in the Last Year: Never true  Transportation Needs: No Transportation Needs   Lack of Transportation (Medical): No   Lack of Transportation (Non-Medical): No  Physical Activity: Not on file  Stress: Not on file  Social Connections: Not on file    Allergies:  No Known Allergies  Objective:    Vital Signs:   Temp:  [97.6 F (36.4 C)-98.3 F (36.8 C)] 97.6 F (36.4 C) (02/24 1040) Pulse Rate:  [74-89] 80 (02/24 1040) Resp:  [18-30] 20 (02/24 1040) BP: (111-137)/(76-115) 113/84 (02/24 1040) SpO2:  [93 %-100 %] 99 % (02/24 0800) Weight:  [95 kg-95.1 kg] 95.1 kg (02/24 0500) Last BM Date : 10/03/21  Weight change: Filed Weights   10/04/21 2100 10/04/21 2110 10/05/21 0500  Weight: 95 kg 95 kg 95.1 kg    Intake/Output:   Intake/Output Summary (Last 24 hours) at 10/05/2021 1416 Last data filed at 10/05/2021 1404 Gross per 24 hour  Intake 476 ml  Output 2000 ml  Net -1524 ml      Physical Exam    General:  disheveled looking elderly male. No resp difficulty HEENT: normal Neck: supple. JVP 12 cm . Carotids 2+ bilat; no bruits. No lymphadenopathy or thyromegaly appreciated. Cor: PMI nondisplaced. Regular rate & rhythm. + TR murmur. Lungs: decreased BS at the bases bilaterally  Abdomen: soft, nontender, nondistended. No hepatosplenomegaly. No bruits or masses. Good bowel sounds. Extremities: no cyanosis, clubbing, rash, 2+ b/l LE edema, distal extremities cool  Neuro: alert & orientedx3, cranial nerves grossly intact. moves all 4 extremities w/o difficulty. Affect pleasant   Telemetry   NSR 90, multiple runs of VT, longest was 28 beats 12:30 today, personally reviewed   EKG    NSR w/ LBBB 146 ms   Labs   Basic Metabolic Panel: Recent Labs  Lab 10/04/21 1153 10/05/21 0517  NA 141 140  K 3.8 4.0  CL 109 109  CO2 20* 20*  GLUCOSE 125* 115*  BUN 22 28*  CREATININE 1.66* 1.93*  CALCIUM 8.8*  8.5*  MG  --  1.7    Liver Function Tests: Recent Labs  Lab 10/05/21 0517  AST 36  ALT 29  ALKPHOS 63  BILITOT 0.9  PROT 5.5*  ALBUMIN 2.8*   No results for input(s): LIPASE, AMYLASE in the last 168 hours. No results for input(s): AMMONIA in the last 168 hours.  CBC: Recent Labs  Lab 10/04/21 1153 10/05/21 0517  WBC 7.1 5.2  NEUTROABS  --  4.5  HGB 14.3 12.9*  HCT 47.3 41.0  MCV 93.8 89.7  PLT 188 144*    Cardiac Enzymes: No results for input(s): CKTOTAL, CKMB, CKMBINDEX, TROPONINI in the last 168 hours.  BNP: BNP (last 3 results) Recent Labs    11/28/20 1042 10/04/21 1153  BNP 2,565.3* 3,768.9*    ProBNP (last 3 results) No results for input(s): PROBNP in the last 8760 hours.   CBG: No results for input(s): GLUCAP in the last 168 hours.  Coagulation Studies: No results for input(s): LABPROT, INR in the last 72 hours.   Imaging   CT CHEST W CONTRAST  Result Date: 10/05/2021 CLINICAL DATA:  Hemoptysis EXAM: CT CHEST WITH CONTRAST TECHNIQUE: Multidetector CT imaging of the chest was performed during intravenous contrast administration. RADIATION DOSE REDUCTION: This exam was performed according to the departmental dose-optimization program which includes automated exposure control, adjustment of the mA and/or kV according to patient size and/or use of iterative reconstruction technique. CONTRAST:  47mL OMNIPAQUE IOHEXOL  300 MG/ML  SOLN COMPARISON:  CT done on 06/10/2019, radiographs done on 10/04/2021 FINDINGS: Cardiovascular: Heart is enlarged in size. There is dilation of left ventricular cavity. There are scattered coronary artery calcifications. Contrast density in the thoracic aorta is less than adequate to evaluate the lumen. There are no intraluminal filling defects in the central pulmonary artery branches. Mediastinum/Nodes: Slightly enlarged lymph nodes in the mediastinum appear more prominent. Lungs/Pleura: There is interval appearance of small left  pleural effusion. There is new patchy infiltrate in the right middle lobe. There are faint ground-glass infiltrates in the left lower lobe. Upper Abdomen: Calcifications are seen in the renal artery branches. There is edema in the subcutaneous plane suggesting anasarca. Musculoskeletal: Degenerative changes are noted in the thoracic and lower cervical spine. There is encroachment of neural foramina at C5-C6 and C6-C7 levels in the lower cervical spine. IMPRESSION: There are new patchy infiltrates in the right middle lobe and left lower lobe suggesting multifocal pneumonia. Small left pleural effusion. Coronary artery calcifications are seen.  Heart is enlarged in size. Other findings as described in the body of the report. Electronically Signed   By: Elmer Picker M.D.   On: 10/05/2021 13:53   ECHOCARDIOGRAM COMPLETE  Result Date: 10/05/2021    ECHOCARDIOGRAM REPORT   Patient Name:   Edward Mullen Date of Exam: 10/05/2021 Medical Rec #:  [id number]         Height:       72.0 in Accession #:    [id number]        Weight:       209.6 lb Date of Birth:  1956/05/20        BSA:          2.173 m Patient Age:    56 years          BP:           111/90 mmHg Patient Gender: M                 HR:           78 bpm. Exam Location:  Inpatient Procedure: 2D Echo, Cardiac Doppler and Color Doppler Indications:    CHF  History:        Patient has prior history of Echocardiogram examinations. CHF                 and Hypertrophic Cardiomyopathy; Risk Factors:Hypertension.  Sonographer:    Jyl Heinz Referring Phys: [place]  1. Basal to mid inferoseptal and inferior akinesis. False tendon. Left ventricular ejection fraction, by estimation, is 20 to 25%. The left ventricle has severely decreased function. The left ventricle demonstrates global hypokinesis. The left ventricular internal cavity size was moderately to severely dilated. There is mild concentric left ventricular hypertrophy. Left  ventricular diastolic parameters are consistent with Grade II diastolic dysfunction (pseudonormalization).  2. Right ventricular systolic function is mildly reduced. The right ventricular size is normal. There is moderately elevated pulmonary artery systolic pressure.  3. Left atrial size was severely dilated.  4. Right atrial size was severely dilated.  5. The mitral valve is normal in structure. Trivial mitral valve regurgitation. No evidence of mitral stenosis.  6. Tricuspid valve regurgitation is mild to moderate.  7. The aortic valve is tricuspid. Aortic valve regurgitation is mild to moderate. No aortic stenosis is present.  8. Aortic dilatation noted. There is mild dilatation of the aortic root, measuring 37 mm. There is mild dilatation of  the ascending aorta, measuring 40 mm.  9. The inferior vena cava is dilated in size with <50% respiratory variability, suggesting right atrial pressure of 15 mmHg. FINDINGS  Left Ventricle: Basal to mid inferoseptal and inferior akinesis. False tendon. Left ventricular ejection fraction, by estimation, is 20 to 25%. The left ventricle has severely decreased function. The left ventricle demonstrates global hypokinesis. The left ventricular internal cavity size was moderately to severely dilated. There is mild concentric left ventricular hypertrophy. Left ventricular diastolic parameters are consistent with Grade II diastolic dysfunction (pseudonormalization). Right Ventricle: The right ventricular size is normal. No increase in right ventricular wall thickness. Right ventricular systolic function is mildly reduced. There is moderately elevated pulmonary artery systolic pressure. The tricuspid regurgitant velocity is 3.22 m/s, and with an assumed right atrial pressure of 15 mmHg, the estimated right ventricular systolic pressure is 97.3 mmHg. Left Atrium: Left atrial size was severely dilated. Right Atrium: Right atrial size was severely dilated. Pericardium: There is no  evidence of pericardial effusion. Mitral Valve: The mitral valve is normal in structure. Trivial mitral valve regurgitation. No evidence of mitral valve stenosis. Tricuspid Valve: The tricuspid valve is normal in structure. Tricuspid valve regurgitation is mild to moderate. No evidence of tricuspid stenosis. Aortic Valve: The aortic valve is tricuspid. Aortic valve regurgitation is mild to moderate. Aortic regurgitation PHT measures 450 msec. No aortic stenosis is present. Aortic valve peak gradient measures 8.3 mmHg. Pulmonic Valve: The pulmonic valve was normal in structure. Pulmonic valve regurgitation is mild. No evidence of pulmonic stenosis. Aorta: Aortic dilatation noted. There is mild dilatation of the aortic root, measuring 37 mm. There is mild dilatation of the ascending aorta, measuring 40 mm. Venous: The inferior vena cava is dilated in size with less than 50% respiratory variability, suggesting right atrial pressure of 15 mmHg. IAS/Shunts: No atrial level shunt detected by color flow Doppler.  LEFT VENTRICLE PLAX 2D LVIDd:         7.30 cm      Diastology LVIDs:         6.80 cm      LV e' medial:    2.40 cm/s LV PW:         1.10 cm      LV E/e' medial:  41.4 LV IVS:        1.30 cm      LV e' lateral:   11.00 cm/s LVOT diam:     2.20 cm      LV E/e' lateral: 9.0 LV SV:         65 LV SV Index:   30 LVOT Area:     3.80 cm  LV Volumes (MOD) LV vol d, MOD A2C: 369.0 ml LV vol d, MOD A4C: 389.0 ml LV vol s, MOD A2C: 269.0 ml LV vol s, MOD A4C: 281.0 ml LV SV MOD A2C:     100.0 ml LV SV MOD A4C:     389.0 ml LV SV MOD BP:      104.4 ml RIGHT VENTRICLE            IVC RV Basal diam:  4.40 cm    IVC diam: 2.30 cm RV Mid diam:    3.40 cm RV S prime:     8.45 cm/s TAPSE (M-mode): 1.4 cm LEFT ATRIUM             Index        RIGHT ATRIUM  Index LA diam:        4.50 cm 2.07 cm/m   RA Area:     25.50 cm LA Vol (A2C):   99.8 ml 45.92 ml/m  RA Volume:   89.00 ml  40.95 ml/m LA Vol (A4C):   87.6 ml 40.30  ml/m LA Biplane Vol: 97.4 ml 44.81 ml/m  AORTIC VALVE AV Area (Vmax): 3.04 cm AV Vmax:        144.00 cm/s AV Peak Grad:   8.3 mmHg LVOT Vmax:      115.00 cm/s LVOT Vmean:     91.400 cm/s LVOT VTI:       0.170 m AI PHT:         450 msec  AORTA Ao Root diam: 3.70 cm Ao Asc diam:  4.00 cm MITRAL VALVE               TRICUSPID VALVE MV Area (PHT): 4.04 cm    TR Peak grad:   41.5 mmHg MV Decel Time: 188 msec    TR Vmax:        322.00 cm/s MR Peak grad: 67.7 mmHg MR Mean grad: 50.0 mmHg    SHUNTS MR Vmax:      411.50 cm/s  Systemic VTI:  0.17 m MR Vmean:     344.0 cm/s   Systemic Diam: 2.20 cm MV E velocity: 99.40 cm/s MV A velocity: 90.70 cm/s MV E/A ratio:  1.10 Skeet Latch MD Electronically signed by Skeet Latch MD Signature Date/Time: 10/05/2021/11:16:47 AM    Final      Medications:     Current Medications:  amiodarone  200 mg Oral Daily   aspirin EC  81 mg Oral Daily   digoxin  0.125 mg Oral Daily   empagliflozin  10 mg Oral Daily   enoxaparin (LOVENOX) injection  40 mg Subcutaneous Q24H   furosemide  80 mg Intravenous BID   ipratropium-albuterol  3 mL Nebulization TID   rosuvastatin  5 mg Oral Daily    Infusions:  magnesium sulfate bolus IVPB        Patient Profile   66 y/o male w/ chronic systolic heart failure, nonischemic CM, CAD, Stage IIIb CKD, h/o polysubstance use and poor compliance, admitted w/ acute on chronic systolic heart failure w/ concern for low output. EF 20-25%, RV mildly reduced.   Assessment/Plan   1. Acute on Chronic Biventricular Systolic Heart Failure:  EF 15-20% with moderate to severe MR on echo 1/18.  Nonischemic cardiomyopathy. HTN vs cocaine use vs viral vs ETOH. HIV negative, SPEP with no M-spike.  Patient does have CAD but not enough to explain cardiomyopathy (last cath in 4/22 with 80% PLV stenosis).  Echo in 8/18 showed EF up to 50%. However, he stopped his meds and started cocaine again, echo in 4/22 with EF back down to 20-25% => suspect  cocaine abuse may be the primary etiology of cardiomyopathy. Poor compliance w/ meds and f/u in the [facility]. Now admitted w/ NYHA Class IIIb symptoms and marked fluid overload. UDS negative. Echo EF 20-25%, RV mildly reduced. Worsening renal function w/ diuresis, SCr 1.66>>1.9, concerning for low output HF.  - continue IV Lasix, increase to 80 mg bid - repeat BMP and check Lactic acid. May need inotropic support w/ milrinone  - place PICC to monitor co-ox and for CVP monitoring  - Apply TED hoses  - Caution w/ digoxin. If SCr continues to rise, will d/c (repeat BMP pending)  - Continue Jardiance 10 mg  daily for now - No ARNi/ ARB or spiro w/ elevated SCr  - no ? blocker w/ concern for low output  - w/ frequent runs of VT/ NSVT, recommend LifeVest at discharge   2. VT/NSVT: multiple runs today, longest was 28 beats - start amio gtt, 150 mg bolus followed by continuous gtt at 60 hr x 6 hrs>>30 hr x 18h  - supp Mg (1.7) - Keep K >4.0 and Mg > 2.0  - HS trop trend not c/w ACS but if ongoing VT may need repeat LHC  - LifeVest at discharge   3. AKI on CKD IIIb - baseline SCr 1.6 - SCr trend 1.66>>1.99 - cardiorenal/ concern for low output - may need inotropic support (see above)  - monitor w/ diuresis  - he did get contrast w/ today's Chest CT - follow BMP closely  - avoid hypotension   4. Hemoptysis  - CT w/ Contrast not suggestive of PE - may be 2/2 pulmonary edema  - follow H/H   5. ? Multifocal PNA  - Chest CT w/ patchy infiltrates in the right middle lobe and left lower lobe, concerning for possible PNA but AF w/ normal WBC - on empiric abx w/ ceftriaxone  - suspect mostly just CHF  - check PCT, if negative consider d/c abx  6. Hypomagnesemia - Mg 1.7 - Supp w/ MgSO4 - Keep Mg >2.0 w/ VT   7. CAD - 80% stenosis PLV on cath in 4/22.  No ischemic chest pain.   - Hs trop 39>>43, c/w demand ischemia - continue medical therapy, ASA + statin   8. H/o Substance Abuse - prior  cocaine history - UDS negative   Medication concerns reviewed with patient and pharmacy team. Barriers identified: poor compliance. Poor insight/ health literacy. Will need to re-enroll in paramedicine. Meds through HF fund.   Length of Stay: 1  Lyda Jester, PA-C  10/05/2021, 2:16 PM  Advanced Heart Failure Team Pager [phone] (M-F; 7a - 5p)  Please contact [facility] for night-coverage after hours (4p -7a ) and weekends on [email or web address]  Patient seen with PA, agree with the above note.   Patient has long history of nonischemic cardiomyopathy, thought to be due to cocaine abuse (has CAD, not significant enough to explain cardiomyopathy).  He reports no cocaine for 1 month.  He reports taking all his meds, but digoxin level undetectable and he cannot name his meds.  He is admitted with volume overload and dyspnea.    Creatinine up to 2.02 with attempts at diuresis, baseline around 1.6.  He is making urine.  Lactate elevated at 2.5.    CT chest with possible RML, LLL PNA.  He has had hemoptysis.  The CT was done with contrast.  PCT < 0.1, afebrile with normal WBCs.   Patient has had runs of NSVT.   Echo today with EF 20-25%, mildly decreased RV systolic function.   General: NAD Neck: JVP 16+, no thyromegaly or thyroid nodule.  Lungs: Clear to auscultation bilaterally with normal respiratory effort. CV: Lateral PMI.  Heart regular S1/S2, no S3/S4, no murmur.  1+ edema to knees.  No carotid bruit.  Normal pedal pulses.  Abdomen: Soft, nontender, no hepatosplenomegaly, no distention.  Skin: Intact without lesions or rashes.  Neurologic: Alert and oriented x 3.  Psych: Normal affect. Extremities: No clubbing or cyanosis.  HEENT: Normal.   Suspect low output HF with elevated lactate.  Questionable medication compliance.  Significant volume overload on exam, he is  diuresing on current Lasix but creatinine rising.  - Continue Lasix 80 mg IV bid.  - Will place PICC to follow CVP  and co-ox.  - Will add milrinone 0.25 mcg/kg/min with low output.  - Hold Entresto, Jardiance, digoxin for now with AKI on CKD stage 3 (not sure if he is actually taking).  - No beta blocker with low output.  - Unlikely to be candidate for advanced therapies => questionable compliance, using cocaine up to 1 month ago.  - If he could stay off cocaine, CRT-D would be an option with wide LBBB.   CT chest looks like multifocal PNA but has low PCT, afebrile, normal WBCs.   - For now, cover with ceftriaxone.   AKI on CKD stage 3, likely cardiorenal but also got contrast load with CT chest yesterday.  Follow creatinine carefully, hopefully will improve with milrinone.   Loralie Champagne 10/05/2021 6:39 PM

## 2021-10-05 NOTE — Progress Notes (Signed)
Date and time results received: 10/05/21 1632 (use smartphrase ".now" to insert current time)  Test: Lactic Acid Critical Value: 2.5  Name of Provider Notified: Rosita Fire PA

## 2021-10-05 NOTE — Progress Notes (Signed)
Peripherally Inserted Central Catheter Placement  The IV Nurse has discussed with the patient and/or persons authorized to consent for the patient, the purpose of this procedure and the potential benefits and risks involved with this procedure.  The benefits include less needle sticks, lab draws from the catheter, and the patient may be discharged home with the catheter. Risks include, but not limited to, infection, bleeding, blood clot (thrombus formation), and puncture of an artery; nerve damage and irregular heartbeat and possibility to perform a PICC exchange if needed/ordered by physician.  Alternatives to this procedure were also discussed.  Bard Power PICC patient education guide, fact sheet on infection prevention and patient information card has been provided to patient /or left at bedside.    PICC Placement Documentation  PICC Double Lumen 24/46/95 Right Basilic 44 cm 0 cm (Active)  Indication for Insertion or Continuance of Line Vasoactive infusions 10/05/21 2100  Exposed Catheter (cm) 0 cm 10/05/21 2100  Site Assessment Clean, Dry, Intact 10/05/21 2100  Lumen #1 Status Saline locked;Blood return noted 10/05/21 2100  Lumen #2 Status Saline locked;Blood return noted 10/05/21 2100  Dressing Type Securing device;Transparent 10/05/21 2100  Dressing Status Antimicrobial disc in place;Clean, Dry, Intact 10/05/21 2100  Safety Lock Not Applicable 03/02/56 5051  Line Care Connections checked and tightened 10/05/21 2100  Line Adjustment (NICU/IV Team Only) No 10/05/21 2100  Dressing Intervention New dressing 10/05/21 2100  Dressing Change Due 10/12/21 10/05/21 2100       Edward Mullen 10/05/2021, 9:01 PM

## 2021-10-05 NOTE — TOC Progression Note (Signed)
Transition of Care [facility]) - Progression Note    Patient Details  Name: Edward Mullen MRN: [id number] Date of Birth: 1956-07-05  Transition of [facility]) CM/SW Contact  Zenon Mayo, RN Phone Number: 10/05/2021, 7:56 AM  Clinical Narrative:    from home with daughter, CHF ex, conts on iv lasix 80mg  .TOC will continue to follow for dc needs.        Expected Discharge Plan and Services                                                 Social Determinants of Health (SDOH) Interventions    Readmission Risk Interventions Readmission Risk Prevention Plan 06/10/2019  Transportation Screening Complete  PCP or Specialist Appt within 5-7 Days Complete  Home Care Screening Complete  Medication Review (RN CM) Complete  Some recent data might be hidden

## 2021-10-05 NOTE — Progress Notes (Addendum)
Heart Failure Navigator Progress Note  Assessed for Heart & Vascular TOC clinic readiness.  Patient is already established with advanced CHF clinic - sees Dr. Aundra Dubin. Notified team of admission.   Navigator available for educational resources.   Kerby Nora, PharmD, BCPS Heart Failure Stewardship Pharmacist Phone [phone]

## 2021-10-06 ENCOUNTER — Inpatient Hospital Stay (HOSPITAL_COMMUNITY): Payer: Medicare (Managed Care)

## 2021-10-06 DIAGNOSIS — J189 Pneumonia, unspecified organism: Secondary | ICD-10-CM

## 2021-10-06 DIAGNOSIS — R042 Hemoptysis: Secondary | ICD-10-CM | POA: Diagnosis not present

## 2021-10-06 LAB — CBC WITH DIFFERENTIAL/PLATELET
Abs Immature Granulocytes: 0.04 10*3/uL (ref 0.00–0.07)
Basophils Absolute: 0 10*3/uL (ref 0.0–0.1)
Basophils Relative: 0 %
Eosinophils Absolute: 0 10*3/uL (ref 0.0–0.5)
Eosinophils Relative: 0 %
HCT: 39 % (ref 39.0–52.0)
Hemoglobin: 12.7 g/dL — ABNORMAL LOW (ref 13.0–17.0)
Immature Granulocytes: 0 %
Lymphocytes Relative: 8 %
Lymphs Abs: 0.9 10*3/uL (ref 0.7–4.0)
MCH: 28.7 pg (ref 26.0–34.0)
MCHC: 32.6 g/dL (ref 30.0–36.0)
MCV: 88 fL (ref 80.0–100.0)
Monocytes Absolute: 1.9 10*3/uL — ABNORMAL HIGH (ref 0.1–1.0)
Monocytes Relative: 16 %
Neutro Abs: 9.2 10*3/uL — ABNORMAL HIGH (ref 1.7–7.7)
Neutrophils Relative %: 76 %
Platelets: 158 10*3/uL (ref 150–400)
RBC: 4.43 MIL/uL (ref 4.22–5.81)
RDW: 15.7 % — ABNORMAL HIGH (ref 11.5–15.5)
WBC: 12.1 10*3/uL — ABNORMAL HIGH (ref 4.0–10.5)
nRBC: 0 % (ref 0.0–0.2)

## 2021-10-06 LAB — CBC
HCT: 38.6 % — ABNORMAL LOW (ref 39.0–52.0)
Hemoglobin: 12.3 g/dL — ABNORMAL LOW (ref 13.0–17.0)
MCH: 28.3 pg (ref 26.0–34.0)
MCHC: 31.9 g/dL (ref 30.0–36.0)
MCV: 88.7 fL (ref 80.0–100.0)
Platelets: 155 10*3/uL (ref 150–400)
RBC: 4.35 MIL/uL (ref 4.22–5.81)
RDW: 15.6 % — ABNORMAL HIGH (ref 11.5–15.5)
WBC: 11 10*3/uL — ABNORMAL HIGH (ref 4.0–10.5)
nRBC: 0 % (ref 0.0–0.2)

## 2021-10-06 LAB — BASIC METABOLIC PANEL
Anion gap: 12 (ref 5–15)
BUN: 34 mg/dL — ABNORMAL HIGH (ref 8–23)
CO2: 23 mmol/L (ref 22–32)
Calcium: 8.5 mg/dL — ABNORMAL LOW (ref 8.9–10.3)
Chloride: 104 mmol/L (ref 98–111)
Creatinine, Ser: 1.97 mg/dL — ABNORMAL HIGH (ref 0.61–1.24)
GFR, Estimated: 37 mL/min — ABNORMAL LOW (ref >60)
Glucose, Bld: 102 mg/dL — ABNORMAL HIGH (ref 70–99)
Potassium: 3.9 mmol/L (ref 3.5–5.1)
Sodium: 139 mmol/L (ref 135–145)

## 2021-10-06 LAB — PROTIME-INR
INR: 1.2 (ref 0.8–1.2)
Prothrombin Time: 15.2 seconds (ref 11.4–15.2)

## 2021-10-06 LAB — IRON AND TIBC
Iron: 36 ug/dL — ABNORMAL LOW (ref 45–182)
Saturation Ratios: 11 % — ABNORMAL LOW (ref 17.9–39.5)
TIBC: 325 ug/dL (ref 250–450)
UIBC: 289 ug/dL

## 2021-10-06 LAB — COOXEMETRY PANEL
Carboxyhemoglobin: 1 % (ref 0.5–1.5)
Methemoglobin: <0.7 % (ref 0.0–1.5)
O2 Saturation: 68.5 %
Total hemoglobin: 12.2 g/dL (ref 12.0–16.0)

## 2021-10-06 LAB — MAGNESIUM: Magnesium: 1.9 mg/dL (ref 1.7–2.4)

## 2021-10-06 LAB — APTT: aPTT: 30 seconds (ref 24–36)

## 2021-10-06 LAB — FIBRINOGEN: Fibrinogen: 435 mg/dL (ref 210–475)

## 2021-10-06 LAB — D-DIMER, QUANTITATIVE: D-Dimer, Quant: 2.18 ug/mL-FEU — ABNORMAL HIGH (ref 0.00–0.50)

## 2021-10-06 LAB — FERRITIN: Ferritin: 88 ng/mL (ref 24–336)

## 2021-10-06 MED ORDER — MAGNESIUM SULFATE 2 GM/50ML IV SOLN: 2.0000 g | Freq: Once | INTRAVENOUS | Status: DC

## 2021-10-06 MED ORDER — MAGNESIUM SULFATE 2 GM/50ML IV SOLN
2.0000 g | Freq: Once | INTRAVENOUS | Status: AC
  Administered 2021-10-06: 2 g via INTRAVENOUS
  Filled 2021-10-06: qty 50

## 2021-10-06 MED ORDER — POTASSIUM CHLORIDE CRYS ER 20 MEQ PO TBCR
40.0000 meq | EXTENDED_RELEASE_TABLET | Freq: Once | ORAL | Status: AC
  Administered 2021-10-06: 40 meq via ORAL
  Filled 2021-10-06: qty 2

## 2021-10-06 NOTE — Evaluation (Signed)
Physical Therapy Evaluation Patient Details Name: Edward Mullen MRN: [id number] DOB: 01-25-1956 Today's Date: 10/06/2021  History of Present Illness  66 y.o. male presents to [facility] on 10/04/2021 with SOB, orthopnea, LE swelling. Pt admitted for CHF exacerbation. PMH includes HTN, DMII, CHF, NSVT, polysubstance abuse.  Clinical Impression  Pt presents to PT with deficits in activity tolerance, power, gait, endurance. Pt reports his activity tolerance is reduced compared to baseline, although expressing improvement compared to date of arrival. Pt demonstrates the ability to mobilize without physical assistance, utilizing SPC to stabilize. Pt will benefit from continued aggressive mobilization to aide in improving endurance.       Recommendations for follow up therapy are one component of a multi-disciplinary discharge planning process, led by the attending physician.  Recommendations may be updated based on patient status, additional functional criteria and insurance authorization.  Follow Up Recommendations No PT follow up    Assistance Recommended at Discharge PRN  Patient can return home with the following  A little help with bathing/dressing/bathroom;Assistance with cooking/housework    Equipment Recommendations None recommended by PT  Recommendations for Other Services       Functional Status Assessment Patient has had a recent decline in their functional status and demonstrates the ability to make significant improvements in function in a reasonable and predictable amount of time.     Precautions / Restrictions Precautions Precautions: Fall Precaution Comments: monitor SpO2 Restrictions Weight Bearing Restrictions: No      Mobility  Bed Mobility Overal bed mobility: Modified Independent             General bed mobility comments: HOB elevated    Transfers Overall transfer level: Modified independent Equipment used: Straight cane                     Ambulation/Gait Ambulation/Gait assistance: Supervision Gait Distance (Feet): 100 Feet Assistive device: Straight cane Gait Pattern/deviations: Step-through pattern Gait velocity: reduced Gait velocity interpretation: 1.31 - 2.62 ft/sec, indicative of limited community ambulator   General Gait Details: pt with slowed step-through gait  Stairs            Wheelchair Mobility    Modified Rankin (Stroke Patients Only)       Balance Overall balance assessment: Needs assistance Sitting-balance support: No upper extremity supported, Feet supported Sitting balance-Leahy Scale: Good     Standing balance support: During functional activity, Single extremity supported Standing balance-Leahy Scale: Poor                               Pertinent Vitals/Pain Pain Assessment Pain Assessment: No/denies pain    Home Living Family/patient expects to be discharged to:: Private residence Living Arrangements: Children (daughter) Available Help at Discharge: Family;Available PRN/intermittently Type of Home: Apartment Home Access: Level entry       Home Layout: One level Home Equipment: Conservation officer, nature (2 wheels);Cane - single point      Prior Function Prior Level of Function : Independent/Modified Independent             Mobility Comments: pt reports ambulating with use of cane       Hand Dominance   Dominant Hand: Right    Extremity/Trunk Assessment   Upper Extremity Assessment Upper Extremity Assessment: Overall WFL for tasks assessed    Lower Extremity Assessment Lower Extremity Assessment: Generalized weakness    Cervical / Trunk Assessment Cervical / Trunk Assessment: Kyphotic  Communication  Communication: No difficulties  Cognition Arousal/Alertness: Awake/alert Behavior During Therapy: WFL for tasks assessed/performed Overall Cognitive Status: Within Functional Limits for tasks assessed                                           General Comments General comments (skin integrity, edema, etc.): pt on RA upon arrival, sats in mid-90s at rest. Pleth reading unreliably when ambulating due to grip on cane, pt denies SOB, SpO2 immediately after completing ambulation at 90%.    Exercises     Assessment/Plan    PT Assessment Patient needs continued PT services  PT Problem List Decreased strength;Decreased activity tolerance;Decreased balance;Decreased mobility;Cardiopulmonary status limiting activity       PT Treatment Interventions Gait training;Therapeutic activities;Therapeutic exercise;Functional mobility training;Patient/family education;Balance training    PT Goals (Current goals can be found in the Care Plan section)  Acute Rehab PT Goals Patient Stated Goal: to return home and improve activity tolerance PT Goal Formulation: With patient Time For Goal Achievement: 10/20/21 Potential to Achieve Goals: Good Additional Goals Additional Goal #1: Pt will reports 3/10 DOE or less when ambulating for >250' to demonstrate improved tolerance for community mobility    Frequency Min 3X/week     Co-evaluation               AM-PAC PT "6 Clicks" Mobility  Outcome Measure Help needed turning from your back to your side while in a flat bed without using bedrails?: None Help needed moving from lying on your back to sitting on the side of a flat bed without using bedrails?: None Help needed moving to and from a bed to a chair (including a wheelchair)?: None Help needed standing up from a chair using your arms (e.g., wheelchair or bedside chair)?: None Help needed to walk in hospital room?: A Little Help needed climbing 3-5 steps with a railing? : A Little 6 Click Score: 22    End of Session   Activity Tolerance: Patient tolerated treatment well Patient left: in bed;with call bell/phone within reach Nurse Communication: Mobility status PT Visit Diagnosis: Other abnormalities of gait and mobility  (R26.89)    Time: 6967-8938 PT Time Calculation (min) (ACUTE ONLY): 13 min   Charges:   PT Evaluation $PT Eval Low Complexity: 1 Low          [name], PT, DPT Acute Rehabilitation Pager: [phone] Office [phone]   [name] 10/06/2021, 10:56 AM

## 2021-10-06 NOTE — Consult Note (Signed)
NAME:  Edward Mullen, MRN:  [id number], DOB:  [birth year], LOS: 2 ADMISSION DATE:  10/04/2021, CONSULTATION DATE:  2/25 REFERRING MD:  Heber [place], CHIEF COMPLAINT:  hemoptysis    History of Present Illness:  This is a 66 year old male who was admitted 2/23 w/ about 1 week h/o progressive orthopnea, exertional dyspnea, intermittent left side chest pain and increased LE swelling. No fever, chills. Had cough w/ clear white mucous. Stated stopped smoking both cigarettes and cocaine about 3 weeks prior. Initial BNP 3768. CXR not really remarkable. Admitted w/ working dx of decompensated HF.  On 2/24 began to c/o hemoptysis intermittently expectorating small bright red clots form time to time to at times just streaky hemoptysis. CT showed  new patchy RML and LLL airspace disease. He was started on abx.  On 2/25 PCXR worse. Hemoptysis persisted so PCCM asked to eval  Pertinent  Medical History  HTN, DM, chronic systolic HF, polysubstance abuse, NSVT  Significant Hospital Events: Including procedures, antibiotic start and stop dates in addition to other pertinent events   2/23 admitted for HF. IV lasix given  2/24 new hemoptysis. CT chest new patchy infiltrates in the right middle lobe and left lower lobe suggesting multifocal pneumonia. Small left pleural effusion. LMWH stopped. Started on rocephin. ECHO 20-25% W/ GLOBAL HYPOKINESIS grade 2 diastolic dysfxn. UDS neg  2/25 pulm asked to see for cxr progression and cont hemoptysis  Interim History / Subjective:  Feels about the same  Objective   Blood pressure 104/80, pulse 63, temperature 97.6 F (36.4 C), temperature source Oral, resp. rate 18, height 6' (1.829 m), weight 93.6 kg, SpO2 94 %. CVP:  [11 mmHg-19 mmHg] 11 mmHg      Intake/Output Summary (Last 24 hours) at 10/06/2021 1718 Last data filed at 10/06/2021 1555 Gross per 24 hour  Intake 1923.92 ml  Output 4258 ml  Net -2334.08 ml   Filed Weights   10/04/21 2110 10/05/21 0500 10/06/21  0430  Weight: 95 kg 95.1 kg 93.6 kg    Examination: General: resting in bed  HENT: NCAT poor dentition  Lungs: coarse bilateral breath sounds  Cardiovascular: RRR Abdomen: soft  Extremities: bilateral LE edema pitting Neuro: awake and oriented  GU: voids.   Resolved Hospital Problem list     Assessment & Plan:  Acute on chronic heart failure  NICM RML and LLL Pneumonia  Hemoptysis  Acute on chronic renal failure (CKD III) CAD NSVT Polysubstance abuse  Tobacco abuse  Anemia   Pulm problem list  Hemoptysis in setting of RML and LLL Pneumonia w/ radiographic progression over last 24 hrs. Plan Cont IV rocephin Hold ASA and LMWH Repeat CXR am  Could consider FOB for airway inspection if no improvement over next 24 -48 hrs      Best Practice (right click and "Reselect all SmartList Selections" daily)   Per primary    Labs   CBC: Recent Labs  Lab 10/04/21 1153 10/05/21 0517 10/06/21 1137  WBC 7.1 5.2 12.1*  NEUTROABS  --  4.5 9.2*  HGB 14.3 12.9* 12.7*  HCT 47.3 41.0 39.0  MCV 93.8 89.7 88.0  PLT 188 144* 536    Basic Metabolic Panel: Recent Labs  Lab 10/04/21 1153 10/05/21 0517 10/05/21 1511 10/06/21 0238  NA 141 140 139 139  K 3.8 4.0 4.1 3.9  CL 109 109 104 104  CO2 20* 20* 25 23  GLUCOSE 125* 115* 111* 102*  BUN 22 28* 31* 34*  CREATININE 1.66*  1.93* 2.02* 1.97*  CALCIUM 8.8* 8.5* 8.7* 8.5*  MG  --  1.7  --  1.9   GFR: Estimated Creatinine Clearance: 43.2 mL/min (A) (by C-G formula based on SCr of 1.97 mg/dL (H)). Recent Labs  Lab 10/04/21 1153 10/05/21 0517 10/05/21 1511 10/05/21 1739 10/06/21 1137  PROCALCITON  --   --  <0.10  --   --   WBC 7.1 5.2  --   --  12.1*  LATICACIDVEN  --   --  2.5* 1.7  --     Liver Function Tests: Recent Labs  Lab 10/05/21 0517  AST 36  ALT 29  ALKPHOS 63  BILITOT 0.9  PROT 5.5*  ALBUMIN 2.8*   No results for input(s): LIPASE, AMYLASE in the last 168 hours. No results for input(s):  AMMONIA in the last 168 hours.  ABG    Component Value Date/Time   PHART 7.350 06/08/2019 1328   PCO2ART 42.8 06/08/2019 1328   PO2ART 234.0 (H) 06/08/2019 1328   HCO3 32.1 (H) 12/04/2020 1329   HCO3 31.8 (H) 12/04/2020 1329   TCO2 34 (H) 12/04/2020 1329   TCO2 33 (H) 12/04/2020 1329   ACIDBASEDEF 2.0 06/08/2019 1328   O2SAT 68.5 10/06/2021 0520     Coagulation Profile: No results for input(s): INR, PROTIME in the last 168 hours.  Cardiac Enzymes: No results for input(s): CKTOTAL, CKMB, CKMBINDEX, TROPONINI in the last 168 hours.  HbA1C: Hgb A1c MFr Bld  Date/Time Value Ref Range Status  12/04/2020 09:00 AM 5.6 4.8 - 5.6 % Final    Comment:    (NOTE) Pre diabetes:          5.7%-6.4%  Diabetes:              >6.4%  Glycemic control for   <7.0% adults with diabetes   05/03/2019 08:41 AM 5.3 4.8 - 5.6 % Final    Comment:    (NOTE) Pre diabetes:          5.7%-6.4% Diabetes:              >6.4% Glycemic control for   <7.0% adults with diabetes     CBG: No results for input(s): GLUCAP in the last 168 hours.  Review of Systems:   Review of Systems  Constitutional:  Negative for chills, fever and weight loss.  HENT: Negative.    Eyes: Negative.   Respiratory:  Positive for cough, hemoptysis, sputum production, shortness of breath and wheezing.   Cardiovascular:  Positive for chest pain, orthopnea and leg swelling.  Gastrointestinal: Negative.   Genitourinary: Negative.   Musculoskeletal: Negative.   Skin: Negative.   Neurological: Negative.   Endo/Heme/Allergies: Negative.   Psychiatric/Behavioral: Negative.      Past Medical History:  He,  has a past medical history of Arthritis, Borderline type 2 diabetes mellitus, CHF (congestive heart failure) ([place]), Dysrhythmia, Hypertension, and NSVT (nonsustained ventricular tachycardia) (09/06/2016).   Surgical History:   Past Surgical History:  Procedure Laterality Date   CARDIAC CATHETERIZATION N/A 08/27/2016    Procedure: Right/Left Heart Cath and Coronary Angiography;  Surgeon: Larey Dresser, MD;  Location: [facility];  Service: Cardiovascular;  Laterality: N/A;   COLONOSCOPY     CYSTECTOMY Right    "back of my shoulder"   RIGHT/LEFT HEART CATH AND CORONARY ANGIOGRAPHY N/A 12/04/2020   Procedure: RIGHT/LEFT HEART CATH AND CORONARY ANGIOGRAPHY;  Surgeon: Larey Dresser, MD;  Location: [facility];  Service: Cardiovascular;  Laterality: N/A;  TONSILLECTOMY       Social History:   reports that he has been smoking cigarettes. He has a 4.80 pack-year smoking history. He has never used smokeless tobacco. He reports current alcohol use of about 1.0 standard drink per week. He reports current drug use. Drug: Marijuana.   Family History:  His family history includes Heart disease (age of onset: 29) in his father; Stroke (age of onset: 57) in his mother. There is no history of Colon cancer, Colon polyps, Esophageal cancer, Stomach cancer, or Rectal cancer.   Allergies No Known Allergies   Home Medications  Prior to Admission medications   Medication Sig Start Date End Date Taking? Authorizing Provider  albuterol (PROVENTIL) (2.5 MG/3ML) 0.083% nebulizer solution Take 2.5 mg by nebulization every 6 (six) hours as needed for shortness of breath or wheezing. 10/01/21  Yes [provider]  amiodarone (PACERONE) 200 MG tablet Take 1 tablet (200 mg total) by mouth daily. 12/26/20  Yes Larey Dresser, MD  bismuth subsalicylate (PEPTO BISMOL) 262 MG/15ML suspension Take 30 mLs by mouth every 6 (six) hours as needed for indigestion.   Yes [provider]  digoxin (LANOXIN) 0.125 MG tablet TAKE 1 TABLET (0.125 MG TOTAL) BY MOUTH DAILY. 04/26/21  Yes Larey Dresser, MD  eplerenone (INSPRA) 25 MG tablet TAKE 1 TABLET (25 MG TOTAL) BY MOUTH DAILY. 04/26/21  Yes Larey Dresser, MD  furosemide (LASIX) 20 MG tablet Take 1 tablet (20 mg total) by mouth daily. 12/26/20 12/26/21 Yes  Larey Dresser, MD  JARDIANCE 10 MG TABS tablet TAKE 1 TABLET (10 MG TOTAL) BY MOUTH DAILY. Patient taking differently: Take 10 mg by mouth daily. 02/20/21  Yes Larey Dresser, MD  rosuvastatin (CRESTOR) 5 MG tablet TAKE 1 TABLET (5 MG TOTAL) BY MOUTH DAILY. Patient taking differently: Take 5 mg by mouth daily. 12/26/20  Yes Larey Dresser, MD  sacubitril-valsartan (ENTRESTO) 24-26 MG Take 1 tablet by mouth 2 (two) times daily. 12/26/20  Yes Larey Dresser, MD  tamsulosin (FLOMAX) 0.4 MG CAPS capsule TAKE ONE CAPSULE BY MOUTH ONCE DAILY AFTER SUPPER Patient taking differently: Take 0.4 mg by mouth daily. 02/20/21  Yes Katsadouros, Vasilios, MD  albuterol (VENTOLIN HFA) 108 (90 Base) MCG/ACT inhaler INHALE 1 TO 2 PUFFS BY MOUTH EVERY 6 HOURS AS NEEDED FOR WHEEZING OR SHORTNESS OF BREATH Patient not taking: Reported on 10/04/2021 07/30/21   Harvie Heck, MD  Blood Pressure Monitor DEVI Please use to monitor blood pressure as directed.  ICD: I10 (hypertension) Patient taking differently: Please use to monitor blood pressure as directed.  ICD: I10 (hypertension) 08/03/20   Jean Rosenthal, MD     Critical care time: NA    Erick Colace ACNP-BC [place] Pager # [phone] OR # [phone] if no answer

## 2021-10-06 NOTE — Progress Notes (Signed)
Advanced Heart Failure Rounding Note   Subjective:    Diuresing well on IV lasix. Out 4L yesterday. Co-ox 69%  Weight down 3 pounds CVP 14  Denies CP, SOB, orthopnea or PND  SCr 2.02 -> 1.97   Objective:   Weight Range:  Vital Signs:   Temp:  [97.3 F (36.3 C)-98.7 F (37.1 C)] 97.6 F (36.4 C) (02/25 1135) Pulse Rate:  [61-74] 63 (02/25 1139) Resp:  [18-20] 18 (02/25 1516) BP: (91-104)/(69-80) 104/80 (02/25 1139) SpO2:  [94 %-99 %] 94 % (02/25 1139) Weight:  [93.6 kg] 93.6 kg (02/25 0430) Last BM Date : 10/05/21  Weight change: Filed Weights   10/04/21 2110 10/05/21 0500 10/06/21 0430  Weight: 95 kg 95.1 kg 93.6 kg    Intake/Output:   Intake/Output Summary (Last 24 hours) at 10/06/2021 1647 Last data filed at 10/06/2021 1555 Gross per 24 hour  Intake 1923.92 ml  Output 4258 ml  Net -2334.08 ml     Physical Exam: General:  Sitting up in bed  No resp difficulty HEENT: normal Neck: supple. JVP to jaw . Carotids 2+ bilat; no bruits. No lymphadenopathy or thryomegaly appreciated. Cor: PMI nondisplaced. Regular rate & rhythm. No rubs, gallops or murmurs. Lungs: clear Abdomen: obese  soft, nontender, nondistended. No hepatosplenomegaly. No bruits or masses. Good bowel sounds. Extremities: no cyanosis, clubbing, rash, 2+ edema Neuro: alert & orientedx3, cranial nerves grossly intact. moves all 4 extremities w/o difficulty. Affect pleasant  Telemetry: Sinus 60-70 Personally reviewed  Labs: Basic Metabolic Panel: Recent Labs  Lab 10/04/21 1153 10/05/21 0517 10/05/21 1511 10/06/21 0238  NA 141 140 139 139  K 3.8 4.0 4.1 3.9  CL 109 109 104 104  CO2 20* 20* 25 23  GLUCOSE 125* 115* 111* 102*  BUN 22 28* 31* 34*  CREATININE 1.66* 1.93* 2.02* 1.97*  CALCIUM 8.8* 8.5* 8.7* 8.5*  MG  --  1.7  --  1.9    Liver Function Tests: Recent Labs  Lab 10/05/21 0517  AST 36  ALT 29  ALKPHOS 63  BILITOT 0.9  PROT 5.5*  ALBUMIN 2.8*   No results for  input(s): LIPASE, AMYLASE in the last 168 hours. No results for input(s): AMMONIA in the last 168 hours.  CBC: Recent Labs  Lab 10/04/21 1153 10/05/21 0517 10/06/21 1137  WBC 7.1 5.2 12.1*  NEUTROABS  --  4.5 9.2*  HGB 14.3 12.9* 12.7*  HCT 47.3 41.0 39.0  MCV 93.8 89.7 88.0  PLT 188 144* 158    Cardiac Enzymes: No results for input(s): CKTOTAL, CKMB, CKMBINDEX, TROPONINI in the last 168 hours.  BNP: BNP (last 3 results) Recent Labs    11/28/20 1042 10/04/21 1153  BNP 2,565.3* 3,768.9*    ProBNP (last 3 results) No results for input(s): PROBNP in the last 8760 hours.    Other results:  Imaging: CT CHEST W CONTRAST  Result Date: 10/05/2021 CLINICAL DATA:  Hemoptysis EXAM: CT CHEST WITH CONTRAST TECHNIQUE: Multidetector CT imaging of the chest was performed during intravenous contrast administration. RADIATION DOSE REDUCTION: This exam was performed according to the departmental dose-optimization program which includes automated exposure control, adjustment of the mA and/or kV according to patient size and/or use of iterative reconstruction technique. CONTRAST:  28mL OMNIPAQUE IOHEXOL 300 MG/ML  SOLN COMPARISON:  CT done on 06/10/2019, radiographs done on 10/04/2021 FINDINGS: Cardiovascular: Heart is enlarged in size. There is dilation of left ventricular cavity. There are scattered coronary artery calcifications. Contrast density in the thoracic  aorta is less than adequate to evaluate the lumen. There are no intraluminal filling defects in the central pulmonary artery branches. Mediastinum/Nodes: Slightly enlarged lymph nodes in the mediastinum appear more prominent. Lungs/Pleura: There is interval appearance of small left pleural effusion. There is new patchy infiltrate in the right middle lobe. There are faint ground-glass infiltrates in the left lower lobe. Upper Abdomen: Calcifications are seen in the renal artery branches. There is edema in the subcutaneous plane suggesting  anasarca. Musculoskeletal: Degenerative changes are noted in the thoracic and lower cervical spine. There is encroachment of neural foramina at C5-C6 and C6-C7 levels in the lower cervical spine. IMPRESSION: There are new patchy infiltrates in the right middle lobe and left lower lobe suggesting multifocal pneumonia. Small left pleural effusion. Coronary artery calcifications are seen.  Heart is enlarged in size. Other findings as described in the body of the report. Electronically Signed   By: Elmer Picker M.D.   On: 10/05/2021 13:53   DG CHEST PORT 1 VIEW  Result Date: 10/06/2021 CLINICAL DATA:  Shortness of breath EXAM: PORTABLE CHEST 1 VIEW COMPARISON:  Previous studies including the examination of 10/04/2021 FINDINGS: Transverse diameter of heart is increased. There is patchy infiltrate in the medial right lower lung fields partly obscuring the right cardiac margin with interval increase. Small linear densities seen in the medial left lower lung fields. Rest of the lung fields are clear. There is no pleural effusion or pneumothorax. Tip of PICC line introduced through the right upper extremity is seen in the superior vena cava close to the right atrium. IMPRESSION: There is interval increase in infiltrate in medial right lower lung fields suggesting worsening of pneumonia in the right middle lobe. Small linear densities in the medial left lower lung fields may suggest subsegmental atelectasis or pneumonitis. Cardiomegaly. Electronically Signed   By: Elmer Picker M.D.   On: 10/06/2021 16:41   ECHOCARDIOGRAM COMPLETE  Result Date: 10/05/2021    ECHOCARDIOGRAM REPORT   Patient Name:   Edward Mullen Date of Exam: 10/05/2021 Medical Rec #:  [id number]         Height:       72.0 in Accession #:    [id number]        Weight:       209.6 lb Date of Birth:  May 13, 1956        BSA:          2.173 m Patient Age:    66 years          BP:           111/90 mmHg Patient Gender: M                 HR:            78 bpm. Exam Location:  Inpatient Procedure: 2D Echo, Cardiac Doppler and Color Doppler Indications:    CHF  History:        Patient has prior history of Echocardiogram examinations. CHF                 and Hypertrophic Cardiomyopathy; Risk Factors:Hypertension.  Sonographer:    Jyl Heinz Referring Phys: [place]  1. Basal to mid inferoseptal and inferior akinesis. False tendon. Left ventricular ejection fraction, by estimation, is 20 to 25%. The left ventricle has severely decreased function. The left ventricle demonstrates global hypokinesis. The left ventricular internal cavity size was moderately to severely dilated. There is mild concentric left ventricular  hypertrophy. Left ventricular diastolic parameters are consistent with Grade II diastolic dysfunction (pseudonormalization).  2. Right ventricular systolic function is mildly reduced. The right ventricular size is normal. There is moderately elevated pulmonary artery systolic pressure.  3. Left atrial size was severely dilated.  4. Right atrial size was severely dilated.  5. The mitral valve is normal in structure. Trivial mitral valve regurgitation. No evidence of mitral stenosis.  6. Tricuspid valve regurgitation is mild to moderate.  7. The aortic valve is tricuspid. Aortic valve regurgitation is mild to moderate. No aortic stenosis is present.  8. Aortic dilatation noted. There is mild dilatation of the aortic root, measuring 37 mm. There is mild dilatation of the ascending aorta, measuring 40 mm.  9. The inferior vena cava is dilated in size with <50% respiratory variability, suggesting right atrial pressure of 15 mmHg. FINDINGS  Left Ventricle: Basal to mid inferoseptal and inferior akinesis. False tendon. Left ventricular ejection fraction, by estimation, is 20 to 25%. The left ventricle has severely decreased function. The left ventricle demonstrates global hypokinesis. The left ventricular internal cavity size was  moderately to severely dilated. There is mild concentric left ventricular hypertrophy. Left ventricular diastolic parameters are consistent with Grade II diastolic dysfunction (pseudonormalization). Right Ventricle: The right ventricular size is normal. No increase in right ventricular wall thickness. Right ventricular systolic function is mildly reduced. There is moderately elevated pulmonary artery systolic pressure. The tricuspid regurgitant velocity is 3.22 m/s, and with an assumed right atrial pressure of 15 mmHg, the estimated right ventricular systolic pressure is 62.1 mmHg. Left Atrium: Left atrial size was severely dilated. Right Atrium: Right atrial size was severely dilated. Pericardium: There is no evidence of pericardial effusion. Mitral Valve: The mitral valve is normal in structure. Trivial mitral valve regurgitation. No evidence of mitral valve stenosis. Tricuspid Valve: The tricuspid valve is normal in structure. Tricuspid valve regurgitation is mild to moderate. No evidence of tricuspid stenosis. Aortic Valve: The aortic valve is tricuspid. Aortic valve regurgitation is mild to moderate. Aortic regurgitation PHT measures 450 msec. No aortic stenosis is present. Aortic valve peak gradient measures 8.3 mmHg. Pulmonic Valve: The pulmonic valve was normal in structure. Pulmonic valve regurgitation is mild. No evidence of pulmonic stenosis. Aorta: Aortic dilatation noted. There is mild dilatation of the aortic root, measuring 37 mm. There is mild dilatation of the ascending aorta, measuring 40 mm. Venous: The inferior vena cava is dilated in size with less than 50% respiratory variability, suggesting right atrial pressure of 15 mmHg. IAS/Shunts: No atrial level shunt detected by color flow Doppler.  LEFT VENTRICLE PLAX 2D LVIDd:         7.30 cm      Diastology LVIDs:         6.80 cm      LV e' medial:    2.40 cm/s LV PW:         1.10 cm      LV E/e' medial:  41.4 LV IVS:        1.30 cm      LV e'  lateral:   11.00 cm/s LVOT diam:     2.20 cm      LV E/e' lateral: 9.0 LV SV:         65 LV SV Index:   30 LVOT Area:     3.80 cm  LV Volumes (MOD) LV vol d, MOD A2C: 369.0 ml LV vol d, MOD A4C: 389.0 ml LV vol s, MOD A2C: 269.0  ml LV vol s, MOD A4C: 281.0 ml LV SV MOD A2C:     100.0 ml LV SV MOD A4C:     389.0 ml LV SV MOD BP:      104.4 ml RIGHT VENTRICLE            IVC RV Basal diam:  4.40 cm    IVC diam: 2.30 cm RV Mid diam:    3.40 cm RV S prime:     8.45 cm/s TAPSE (M-mode): 1.4 cm LEFT ATRIUM             Index        RIGHT ATRIUM           Index LA diam:        4.50 cm 2.07 cm/m   RA Area:     25.50 cm LA Vol (A2C):   99.8 ml 45.92 ml/m  RA Volume:   89.00 ml  40.95 ml/m LA Vol (A4C):   87.6 ml 40.30 ml/m LA Biplane Vol: 97.4 ml 44.81 ml/m  AORTIC VALVE AV Area (Vmax): 3.04 cm AV Vmax:        144.00 cm/s AV Peak Grad:   8.3 mmHg LVOT Vmax:      115.00 cm/s LVOT Vmean:     91.400 cm/s LVOT VTI:       0.170 m AI PHT:         450 msec  AORTA Ao Root diam: 3.70 cm Ao Asc diam:  4.00 cm MITRAL VALVE               TRICUSPID VALVE MV Area (PHT): 4.04 cm    TR Peak grad:   41.5 mmHg MV Decel Time: 188 msec    TR Vmax:        322.00 cm/s MR Peak grad: 67.7 mmHg MR Mean grad: 50.0 mmHg    SHUNTS MR Vmax:      411.50 cm/s  Systemic VTI:  0.17 m MR Vmean:     344.0 cm/s   Systemic Diam: 2.20 cm MV E velocity: 99.40 cm/s MV A velocity: 90.70 cm/s MV E/A ratio:  1.10 Skeet Latch MD Electronically signed by Skeet Latch MD Signature Date/Time: 10/05/2021/11:16:47 AM    Final    [place] EKG SITE RITE  Result Date: 10/05/2021 If Site Rite image not attached, placement could not be confirmed due to current cardiac rhythm.    Medications:     Scheduled Medications:  aspirin EC  81 mg Oral Daily   Chlorhexidine Gluconate Cloth  6 each Topical Daily   empagliflozin  10 mg Oral Daily   enoxaparin (LOVENOX) injection  40 mg Subcutaneous Q24H   furosemide  80 mg Intravenous BID   ipratropium-albuterol  3  mL Nebulization TID   rosuvastatin  5 mg Oral Daily   sodium chloride flush  10-40 mL Intracatheter Q12H    Infusions:  amiodarone 30 mg/hr (10/06/21 0511)   cefTRIAXone (ROCEPHIN)  IV 1 g (10/06/21 1612)   milrinone 0.25 mcg/kg/min (10/05/21 2201)    PRN Medications: acetaminophen **OR** acetaminophen, albuterol, sodium chloride flush   Assessment:   1. Acute on Chronic Biventricular Systolic Heart Failure:  EF 15-20% with moderate to severe MR on echo 1/18.  Nonischemic cardiomyopathy. HTN vs cocaine use vs viral vs ETOH. HIV negative, SPEP with no M-spike.  Patient does have CAD but not enough to explain cardiomyopathy (last cath in 4/22 with 80% PLV stenosis).  Echo in 8/18 showed EF up to  50%. However, he stopped his meds and started cocaine again, echo in 4/22 with EF back down to 20-25% => suspect cocaine abuse may be the primary etiology of cardiomyopathy. Poor compliance w/ meds and f/u in the [facility]. Now admitted w/ NYHA Class IIIb symptoms and marked fluid overload. UDS negative. Echo EF 20-25%, RV mildly reduced. Worsening renal function w/ diuresis, SCr 1.66>>1.9, concerning for low output HF. Lasix increased to 80 IV bid with good effect. Co-ox 69% Weight down 3 pounds. CVP 14 - continue IV Lasix @ 80IV bid. No need for inotropes currently but will follow co-ox closely. Low threshold to add - Place TED hose - Caution w/ digoxin. If SCr continues to rise, will d/c (repeat BMP pending)  - Continue Jardiance 10 mg daily for now - No ARNi/ ARB or spiro w/ elevated SCr  - no ? blocker w/ concern for low output  - w/ frequent runs of VT/ NSVT, recommend LifeVest at discharge    2. VT/NSVT: multiple runs today, longest was 28 beats - start amio gtt, 150 mg bolus followed by continuous gtt at 60 hr x 6 hrs>>30 hr x 18h  - supp Mg (1.9) - Keep K >4.0 and Mg > 2.0  - HS trop trend not c/w ACS but if ongoing VT may need repeat LHC  - LifeVest at discharge    3. AKI on CKD IIIb -  baseline SCr 1.6 - SCr trend 1.66>>1.99 -> 2.02 -> 1.97 - cardiorenal/ concern for low output but co-ox ok so far - monitor w/ diuresis  - follow BMP closely  - avoid hypotension    4. Hemoptysis  - CT w/ Contrast not suggestive of PE - may be 2/2 pulmonary edema vs PNA - follow H/H    5. ? Multifocal PNA  - Chest CT w/ patchy infiltrates in the right middle lobe and left lower lobe, concerning for possible PNA but AF w/ normal WBC - on empiric abx w/ ceftriaxone  - suspect mostly just CHF  - check PCT, if negative consider d/c abx   6. Hypomagnesemia - Mg 1.9 - Supp w/ MgSO4 - Keep Mg >2.0 w/ VT    7. CAD - 80% stenosis PLV on cath in 4/22.  No s/s ischemia  - Hs trop 39>>43, c/w demand ischemia - continue medical therapy, ASA + statin    8. H/o Substance Abuse - prior cocaine history   Length of Stay: 2   Glori Bickers MD 10/06/2021, 4:47 PM  Advanced Heart Failure Team Pager [phone] (M-F; 7a - 4p)  Please contact [facility] for night-coverage after hours (4p -7a ) and weekends on [email or web address]

## 2021-10-06 NOTE — Progress Notes (Signed)
Pharmacist Heart Failure Core Measure Documentation  Assessment: Edward Mullen has an EF documented as 20-25% on 10/05/21 by ECHO.  Rationale: Heart failure patients with left ventricular systolic dysfunction (LVSD) and an EF < 40% should be prescribed an angiotensin converting enzyme inhibitor (ACEI) or angiotensin receptor blocker (ARB) at discharge unless a contraindication is documented in the medical record.  This patient is not currently on an ACEI or ARB for HF.  This note is being placed in the record in order to provide documentation that a contraindication to the use of these agents is present for this encounter.  ACE Inhibitor or Angiotensin Receptor Blocker is contraindicated (specify all that apply)  []   ACEI allergy AND ARB allergy []   Angioedema []   Moderate or severe aortic stenosis []   Hyperkalemia []   Hypotension []   Renal artery stenosis [x]   Worsening renal function, preexisting renal disease or dysfunction  Of note, the patient is on Entresto PTA, but is currently on hold due to worsening renal function. Would recommend to resume when clinically able.  Shauna Hugh, PharmD, [place]  PGY-2 Pharmacy Resident 10/06/2021 3:06 PM  Please check [email or web address] for unit-specific pharmacy phone numbers.

## 2021-10-06 NOTE — Progress Notes (Addendum)
HD#2 Subjective:  Overnight Events: No acute events overnight  Patient resting in bed comfortably saturating well on room air.  Feels as though his swelling and breathing have improved significantly.  Endorses frequent urination.  He continues to cough up bloody sputum, around 30 cc of bright red/dark blood in a cup at bedside.  Notes having 1 episode similar to this a while ago that resolved on its own.  Objective:  Vital signs in last 24 hours: Vitals:   10/05/21 2001 10/05/21 2137 10/06/21 0000 10/06/21 0430  BP:  91/75 99/69 97/79   Pulse:  70 73 74  Resp:   20 20  Temp:  98.7 F (37.1 C) 98.6 F (37 C) (!) 97.3 F (36.3 C)  TempSrc:  Oral Oral Oral  SpO2: 96% 99% 98% 99%  Weight:    93.6 kg  Height:       Supplemental O2: Nasal Cannula SpO2: 99 % O2 Flow Rate (L/min): 2 L/min  Physical Exam:  Constitutional: Elderly appearing, no acute distress HENT: normocephalic atraumatic, mucous membranes moist.  Poor dentition Eyes: conjunctiva non-erythematous Neck: supple Cardiovascular: regular rate and rhythm, no m/r/g.  JVD base of neck. Pulmonary/Chest: normal work of breathing on room air, decreased breath sound bases bilaterally.  Rhonchorous breath sounds at right middle lung. Abdominal: soft, non-tender, non-distended MSK: normal bulk and tone.  Trace lower extremity edema Neurological: alert & oriented x 3 Skin: warm and dry Psych: Normal mood and thought processes  Filed Weights   10/04/21 2110 10/05/21 0500 10/06/21 0430  Weight: 95 kg 95.1 kg 93.6 kg     Intake/Output Summary (Last 24 hours) at 10/06/2021 0624 Last data filed at 10/06/2021 0511 Gross per 24 hour  Intake 2075.49 ml  Output 3950 ml  Net -1874.51 ml   Net IO Since Admission: -2,474.51 mL [10/06/21 0624]  Pertinent Labs: CBC Latest Ref Rng & Units 10/05/2021 10/04/2021 12/05/2020  WBC 4.0 - 10.5 K/uL 5.2 7.1 5.8  Hemoglobin 13.0 - 17.0 g/dL 12.9(L) 14.3 13.9  Hematocrit 39.0 - 52.0 % 41.0  47.3 44.0  Platelets 150 - 400 K/uL 144(L) 188 202    CMP Latest Ref Rng & Units 10/06/2021 10/05/2021 10/05/2021  Glucose 70 - 99 mg/dL 102(H) 111(H) 115(H)  BUN 8 - 23 mg/dL 34(H) 31(H) 28(H)  Creatinine 0.61 - 1.24 mg/dL 1.97(H) 2.02(H) 1.93(H)  Sodium 135 - 145 mmol/L 139 139 140  Potassium 3.5 - 5.1 mmol/L 3.9 4.1 4.0  Chloride 98 - 111 mmol/L 104 104 109  CO2 22 - 32 mmol/L 23 25 20(L)  Calcium 8.9 - 10.3 mg/dL 8.5(L) 8.7(L) 8.5(L)  Total Protein 6.5 - 8.1 g/dL - - 5.5(L)  Total Bilirubin 0.3 - 1.2 mg/dL - - 0.9  Alkaline Phos 38 - 126 U/L - - 63  AST 15 - 41 U/L - - 36  ALT 0 - 44 U/L - - 29    Imaging: CT CHEST W CONTRAST  Result Date: 10/05/2021 CLINICAL DATA:  Hemoptysis EXAM: CT CHEST WITH CONTRAST TECHNIQUE: Multidetector CT imaging of the chest was performed during intravenous contrast administration. RADIATION DOSE REDUCTION: This exam was performed according to the departmental dose-optimization program which includes automated exposure control, adjustment of the mA and/or kV according to patient size and/or use of iterative reconstruction technique. CONTRAST:  65mL OMNIPAQUE IOHEXOL 300 MG/ML  SOLN COMPARISON:  CT done on 06/10/2019, radiographs done on 10/04/2021 FINDINGS: Cardiovascular: Heart is enlarged in size. There is dilation of left ventricular cavity. There  are scattered coronary artery calcifications. Contrast density in the thoracic aorta is less than adequate to evaluate the lumen. There are no intraluminal filling defects in the central pulmonary artery branches. Mediastinum/Nodes: Slightly enlarged lymph nodes in the mediastinum appear more prominent. Lungs/Pleura: There is interval appearance of small left pleural effusion. There is new patchy infiltrate in the right middle lobe. There are faint ground-glass infiltrates in the left lower lobe. Upper Abdomen: Calcifications are seen in the renal artery branches. There is edema in the subcutaneous plane suggesting  anasarca. Musculoskeletal: Degenerative changes are noted in the thoracic and lower cervical spine. There is encroachment of neural foramina at C5-C6 and C6-C7 levels in the lower cervical spine. IMPRESSION: There are new patchy infiltrates in the right middle lobe and left lower lobe suggesting multifocal pneumonia. Small left pleural effusion. Coronary artery calcifications are seen.  Heart is enlarged in size. Other findings as described in the body of the report. Electronically Signed   By: Elmer Picker M.D.   On: 10/05/2021 13:53   ECHOCARDIOGRAM COMPLETE  Result Date: 10/05/2021    ECHOCARDIOGRAM REPORT   Patient Name:   Edward Mullen Date of Exam: 10/05/2021 Medical Rec #:  [id number]         Height:       72.0 in Accession #:    [id number]        Weight:       209.6 lb Date of Birth:  Mar 12, 1956        BSA:          2.173 m Patient Age:    66 years          BP:           111/90 mmHg Patient Gender: M                 HR:           78 bpm. Exam Location:  Inpatient Procedure: 2D Echo, Cardiac Doppler and Color Doppler Indications:    CHF  History:        Patient has prior history of Echocardiogram examinations. CHF                 and Hypertrophic Cardiomyopathy; Risk Factors:Hypertension.  Sonographer:    Jyl Heinz Referring Phys: [place]  1. Basal to mid inferoseptal and inferior akinesis. False tendon. Left ventricular ejection fraction, by estimation, is 20 to 25%. The left ventricle has severely decreased function. The left ventricle demonstrates global hypokinesis. The left ventricular internal cavity size was moderately to severely dilated. There is mild concentric left ventricular hypertrophy. Left ventricular diastolic parameters are consistent with Grade II diastolic dysfunction (pseudonormalization).  2. Right ventricular systolic function is mildly reduced. The right ventricular size is normal. There is moderately elevated pulmonary artery systolic  pressure.  3. Left atrial size was severely dilated.  4. Right atrial size was severely dilated.  5. The mitral valve is normal in structure. Trivial mitral valve regurgitation. No evidence of mitral stenosis.  6. Tricuspid valve regurgitation is mild to moderate.  7. The aortic valve is tricuspid. Aortic valve regurgitation is mild to moderate. No aortic stenosis is present.  8. Aortic dilatation noted. There is mild dilatation of the aortic root, measuring 37 mm. There is mild dilatation of the ascending aorta, measuring 40 mm.  9. The inferior vena cava is dilated in size with <50% respiratory variability, suggesting right atrial pressure of 15 mmHg. FINDINGS  Left Ventricle: Basal to mid inferoseptal and inferior akinesis. False tendon. Left ventricular ejection fraction, by estimation, is 20 to 25%. The left ventricle has severely decreased function. The left ventricle demonstrates global hypokinesis. The left ventricular internal cavity size was moderately to severely dilated. There is mild concentric left ventricular hypertrophy. Left ventricular diastolic parameters are consistent with Grade II diastolic dysfunction (pseudonormalization). Right Ventricle: The right ventricular size is normal. No increase in right ventricular wall thickness. Right ventricular systolic function is mildly reduced. There is moderately elevated pulmonary artery systolic pressure. The tricuspid regurgitant velocity is 3.22 m/s, and with an assumed right atrial pressure of 15 mmHg, the estimated right ventricular systolic pressure is 76.7 mmHg. Left Atrium: Left atrial size was severely dilated. Right Atrium: Right atrial size was severely dilated. Pericardium: There is no evidence of pericardial effusion. Mitral Valve: The mitral valve is normal in structure. Trivial mitral valve regurgitation. No evidence of mitral valve stenosis. Tricuspid Valve: The tricuspid valve is normal in structure. Tricuspid valve regurgitation is mild  to moderate. No evidence of tricuspid stenosis. Aortic Valve: The aortic valve is tricuspid. Aortic valve regurgitation is mild to moderate. Aortic regurgitation PHT measures 450 msec. No aortic stenosis is present. Aortic valve peak gradient measures 8.3 mmHg. Pulmonic Valve: The pulmonic valve was normal in structure. Pulmonic valve regurgitation is mild. No evidence of pulmonic stenosis. Aorta: Aortic dilatation noted. There is mild dilatation of the aortic root, measuring 37 mm. There is mild dilatation of the ascending aorta, measuring 40 mm. Venous: The inferior vena cava is dilated in size with less than 50% respiratory variability, suggesting right atrial pressure of 15 mmHg. IAS/Shunts: No atrial level shunt detected by color flow Doppler.  LEFT VENTRICLE PLAX 2D LVIDd:         7.30 cm      Diastology LVIDs:         6.80 cm      LV e' medial:    2.40 cm/s LV PW:         1.10 cm      LV E/e' medial:  41.4 LV IVS:        1.30 cm      LV e' lateral:   11.00 cm/s LVOT diam:     2.20 cm      LV E/e' lateral: 9.0 LV SV:         65 LV SV Index:   30 LVOT Area:     3.80 cm  LV Volumes (MOD) LV vol d, MOD A2C: 369.0 ml LV vol d, MOD A4C: 389.0 ml LV vol s, MOD A2C: 269.0 ml LV vol s, MOD A4C: 281.0 ml LV SV MOD A2C:     100.0 ml LV SV MOD A4C:     389.0 ml LV SV MOD BP:      104.4 ml RIGHT VENTRICLE            IVC RV Basal diam:  4.40 cm    IVC diam: 2.30 cm RV Mid diam:    3.40 cm RV S prime:     8.45 cm/s TAPSE (M-mode): 1.4 cm LEFT ATRIUM             Index        RIGHT ATRIUM           Index LA diam:        4.50 cm 2.07 cm/m   RA Area:     25.50 cm LA Vol (A2C):  99.8 ml 45.92 ml/m  RA Volume:   89.00 ml  40.95 ml/m LA Vol (A4C):   87.6 ml 40.30 ml/m LA Biplane Vol: 97.4 ml 44.81 ml/m  AORTIC VALVE AV Area (Vmax): 3.04 cm AV Vmax:        144.00 cm/s AV Peak Grad:   8.3 mmHg LVOT Vmax:      115.00 cm/s LVOT Vmean:     91.400 cm/s LVOT VTI:       0.170 m AI PHT:         450 msec  AORTA Ao Root diam: 3.70  cm Ao Asc diam:  4.00 cm MITRAL VALVE               TRICUSPID VALVE MV Area (PHT): 4.04 cm    TR Peak grad:   41.5 mmHg MV Decel Time: 188 msec    TR Vmax:        322.00 cm/s MR Peak grad: 67.7 mmHg MR Mean grad: 50.0 mmHg    SHUNTS MR Vmax:      411.50 cm/s  Systemic VTI:  0.17 m MR Vmean:     344.0 cm/s   Systemic Diam: 2.20 cm MV E velocity: 99.40 cm/s MV A velocity: 90.70 cm/s MV E/A ratio:  1.10 Skeet Latch MD Electronically signed by Skeet Latch MD Signature Date/Time: 10/05/2021/11:16:47 AM    Final    [place] EKG SITE RITE  Result Date: 10/05/2021 If Site Rite image not attached, placement could not be confirmed due to current cardiac rhythm.   Assessment/Plan:   Principal Problem:   Acute exacerbation of CHF (congestive heart failure) (HCC) Active Problems:   Polysubstance abuse ([place])   Hemoptysis   Type 2 diabetes mellitus with stage 3b chronic kidney disease, without long-term current use of insulin ([place])  Patient Summary: Edward Mullen is a 66 y.o. with a pertinent PMH of hypertension, diabetes, chronic systolic heart failure, history of NSVT, history of polysubstance abuse who presented with shortness of breath, lower extremity swelling, orthopnea, dyspnea on exertion and admitted for heart failure exacerbation.  He is diuresing well, heart failure team following and assisting in his care.  Milrinone started yesterday with hopes of improvement of his cardiac output.  Acute on chronic heart failure Nonischemic cardiomyopathy Patient continues to diurese well, weight down 1.4 kg from admission.  Lower extremity edema as well as JVD improved.  AKI not worsening.  Heart failure team following, PICC line placed CVP and Co. ox performed.  Milrinone added and Entresto, Jardiance, digoxin held in setting of his AKI. Echocardiogram performed yesterday EF of 20 to 25% with mildly decreased RV systolic function We will continue diuresing with 80 Lasix twice daily.  With digoxin level  being on therapeutic, suspect he has not been taking his medications and that led to his exacerbation.   -Appreciate heart failure's assistance in his care -80 Lasix IV twice daily -Milrinone 0.25 mcg/kg/min -Holding [place], [place], digoxin secondary to AKI  Multiple pulmonary infiltrates  Hemoptysis Patchy infiltrates in RML and LLL on CT chest. Unclear etiology at this time.  Procalcitonin negative yesterday.  Patient has remained afebrile and without a leukocytosis.  Low suspicion that this is a pneumonia, possibly secondary to his worsening heart failure.  If patient becomes anemic will consider pulmonary consult for possible bronc.  He is saturating well on room air since diuresing, not suspect hemoptysis is contributing to his hypoxia or he is at risk for respiratory compromise at this point.  Low  suspicion for pulmonary embolism, patient not tachycardic and no right heart strain seen on echocardiogram.  CT chest with contrast yesterday without evidence of nodule or suspicious for malignancy.  Addendum: Patient's white count elevated from 5-12.  Possibly secondary to steroids given a few days ago however unable to rule out infectious etiology will continue with antibiotics at this time. -CBC pending -Ceftriaxone day 2 of 5 -Consider pulmonary consult -If anemic, will discontinue anticoagulation/antiplatelet therapy  Acute on chronic kidney injury CKD 3a Patient's creatinine at baseline on admission.  Increasing creatinine after starting back his home medications which seems as though he has not been taking. These were discontinued yesterday and patient was started on milrinone. Hopefully will see improvement with medication changes and continued diuresis. If worsening or no improvement will obtain UA.  Low suspicion for obstructive etiology as patient is urinating well.  Did receive IV contrast yesterday however creatinine has remained stable since. -Avoid nephrotoxic medications -If no  improvement or worsening tomorrow, obtain UA and renal ultrasound  VT/NSVT 2 runs of nonsustained V. tach overnight.  He was started on Amio gtt. yesterday by cardiology.  We will continue to monitor and correct electrolyte abnormalities.  CAD Denies chest pain this morning.  History of 80% stenosed PLV on cath. -Continue aspirin 81 mg daily, Crestor 5 mg daily  Suspected COPD -No wheezing on exam today, continue DuoNebs as needed -We will need outpatient PFTs  Diet: Heart Healthy IVF: None,None VTE: Enoxaparin Code: Full PT/OT recs: Pending  Dispo: Anticipated discharge home pending further management of his acute heart failure exacerbation  Sanjuana Letters DO Internal Medicine Resident PGY-2 Pager [phone] Please contact the on call pager after 5 pm and on weekends at [phone].

## 2021-10-06 NOTE — Significant Event (Signed)
Pt SOB 02 sat 89 on room with grunting administered Breathing treatment.

## 2021-10-06 NOTE — Progress Notes (Signed)
Pt continue to cough up blood MD aware will update and give medications.

## 2021-10-06 NOTE — Progress Notes (Signed)
patient coughed up more blood this is the 2nd occurrence today in clean cup also having pain on Right side rated at 5 out of 10. MD Made aware.

## 2021-10-06 NOTE — Significant Event (Signed)
Tpt is oozing blood around PICC as well and other PIV. Notified MD Clotting factor labs drawn.

## 2021-10-06 NOTE — Progress Notes (Signed)
Pt CBC Lab draw has been completed

## 2021-10-07 ENCOUNTER — Inpatient Hospital Stay (HOSPITAL_COMMUNITY): Payer: Medicare (Managed Care)

## 2021-10-07 DIAGNOSIS — R042 Hemoptysis: Secondary | ICD-10-CM | POA: Diagnosis not present

## 2021-10-07 LAB — CBC WITH DIFFERENTIAL/PLATELET
Abs Immature Granulocytes: 0.03 10*3/uL (ref 0.00–0.07)
Basophils Absolute: 0 10*3/uL (ref 0.0–0.1)
Basophils Relative: 0 %
Eosinophils Absolute: 0 10*3/uL (ref 0.0–0.5)
Eosinophils Relative: 0 %
HCT: 38.3 % — ABNORMAL LOW (ref 39.0–52.0)
Hemoglobin: 12.6 g/dL — ABNORMAL LOW (ref 13.0–17.0)
Immature Granulocytes: 0 %
Lymphocytes Relative: 10 %
Lymphs Abs: 1 10*3/uL (ref 0.7–4.0)
MCH: 28.8 pg (ref 26.0–34.0)
MCHC: 32.9 g/dL (ref 30.0–36.0)
MCV: 87.6 fL (ref 80.0–100.0)
Monocytes Absolute: 1.6 10*3/uL — ABNORMAL HIGH (ref 0.1–1.0)
Monocytes Relative: 16 %
Neutro Abs: 7.5 10*3/uL (ref 1.7–7.7)
Neutrophils Relative %: 74 %
Platelets: 149 10*3/uL — ABNORMAL LOW (ref 150–400)
RBC: 4.37 MIL/uL (ref 4.22–5.81)
RDW: 15.6 % — ABNORMAL HIGH (ref 11.5–15.5)
WBC: 10.2 10*3/uL (ref 4.0–10.5)
nRBC: 0 % (ref 0.0–0.2)

## 2021-10-07 LAB — SAVE SMEAR(SSMR), FOR PROVIDER SLIDE REVIEW

## 2021-10-07 LAB — BASIC METABOLIC PANEL
Anion gap: 12 (ref 5–15)
BUN: 24 mg/dL — ABNORMAL HIGH (ref 8–23)
CO2: 27 mmol/L (ref 22–32)
Calcium: 7.9 mg/dL — ABNORMAL LOW (ref 8.9–10.3)
Chloride: 97 mmol/L — ABNORMAL LOW (ref 98–111)
Creatinine, Ser: 1.6 mg/dL — ABNORMAL HIGH (ref 0.61–1.24)
GFR, Estimated: 47 mL/min — ABNORMAL LOW (ref >60)
Glucose, Bld: 238 mg/dL — ABNORMAL HIGH (ref 70–99)
Potassium: 3.2 mmol/L — ABNORMAL LOW (ref 3.5–5.1)
Sodium: 136 mmol/L (ref 135–145)

## 2021-10-07 LAB — TECHNOLOGIST SMEAR REVIEW: Plt Morphology: UNDETERMINED

## 2021-10-07 LAB — COOXEMETRY PANEL
Carboxyhemoglobin: 1.3 % (ref 0.5–1.5)
Methemoglobin: <0.7 % (ref 0.0–1.5)
O2 Saturation: 68.5 %
Total hemoglobin: 12.8 g/dL (ref 12.0–16.0)

## 2021-10-07 LAB — MAGNESIUM: Magnesium: 1.9 mg/dL (ref 1.7–2.4)

## 2021-10-07 MED ORDER — POTASSIUM CHLORIDE CRYS ER 20 MEQ PO TBCR
40.0000 meq | EXTENDED_RELEASE_TABLET | Freq: Two times a day (BID) | ORAL | Status: DC
  Administered 2021-10-07 – 2021-10-08 (×4): 40 meq via ORAL
  Filled 2021-10-07 (×4): qty 2

## 2021-10-07 MED ORDER — MAGNESIUM SULFATE 2 GM/50ML IV SOLN
2.0000 g | Freq: Once | INTRAVENOUS | Status: AC
  Administered 2021-10-07: 2 g via INTRAVENOUS
  Filled 2021-10-07: qty 50

## 2021-10-07 NOTE — Progress Notes (Signed)
Advanced Heart Failure Rounding Note   Subjective:    Diuresing well on IV lasix. Weight down 8 pounds in 2 days. Co-ox 69%  CVP 9-10  Had hemoptysis overnight. Seen by Pulmonary. Felt may be related to PNA.   SCr 2.02 -> 1.97 -> 1.60  K 3.2  Feels ok. Denies CP or SOB.   Objective:   Weight Range:  Vital Signs:   Temp:  [97.5 F (36.4 C)-98.1 F (36.7 C)] 97.5 F (36.4 C) (02/26 1013) Pulse Rate:  [63-88] 64 (02/26 1013) Resp:  [17-20] 17 (02/26 1013) BP: (94-106)/(62-80) 96/70 (02/26 1013) SpO2:  [89 %-100 %] 96 % (02/26 1013) Weight:  [91.2 kg] 91.2 kg (02/26 0412) Last BM Date : 10/06/21  Weight change: Filed Weights   10/05/21 0500 10/06/21 0430 10/07/21 0412  Weight: 95.1 kg 93.6 kg 91.2 kg    Intake/Output:   Intake/Output Summary (Last 24 hours) at 10/07/2021 1120 Last data filed at 10/07/2021 1013 Gross per 24 hour  Intake 1001.06 ml  Output 4809 ml  Net -3807.94 ml      Physical Exam: General: Lying in bed No resp difficulty HEENT: normal Neck: supple. JVP 7-8 Carotids 2+ bilat; no bruits. No lymphadenopathy or thryomegaly appreciated. Cor: PMI nondisplaced. Regular rate & rhythm. No rubs, gallops or murmurs. Lungs: clear Abdomen: soft, nontender, nondistended. No hepatosplenomegaly. No bruits or masses. Good bowel sounds. Extremities: no cyanosis, clubbing, rash, 1+ edema Neuro: alert & orientedx3, cranial nerves grossly intact. moves all 4 extremities w/o difficulty. Affect pleasant    Telemetry: Sinus 60-70 Personally reviewed  Labs: Basic Metabolic Panel: Recent Labs  Lab 10/04/21 1153 10/05/21 0517 10/05/21 1511 10/06/21 0238 10/07/21 0500  NA 141 140 139 139 136  K 3.8 4.0 4.1 3.9 3.2*  CL 109 109 104 104 97*  CO2 20* 20* 25 23 27   GLUCOSE 125* 115* 111* 102* 238*  BUN 22 28* 31* 34* 24*  CREATININE 1.66* 1.93* 2.02* 1.97* 1.60*  CALCIUM 8.8* 8.5* 8.7* 8.5* 7.9*  MG  --  1.7  --  1.9 1.9     Liver Function  Tests: Recent Labs  Lab 10/05/21 0517  AST 36  ALT 29  ALKPHOS 63  BILITOT 0.9  PROT 5.5*  ALBUMIN 2.8*    No results for input(s): LIPASE, AMYLASE in the last 168 hours. No results for input(s): AMMONIA in the last 168 hours.  CBC: Recent Labs  Lab 10/04/21 1153 10/05/21 0517 10/06/21 1137 10/06/21 1843 10/07/21 0500  WBC 7.1 5.2 12.1* 11.0* 10.2  NEUTROABS  --  4.5 9.2*  --  7.5  HGB 14.3 12.9* 12.7* 12.3* 12.6*  HCT 47.3 41.0 39.0 38.6* 38.3*  MCV 93.8 89.7 88.0 88.7 87.6  PLT 188 144* 158 155 149*     Cardiac Enzymes: No results for input(s): CKTOTAL, CKMB, CKMBINDEX, TROPONINI in the last 168 hours.  BNP: BNP (last 3 results) Recent Labs    11/28/20 1042 10/04/21 1153  BNP 2,565.3* 3,768.9*     ProBNP (last 3 results) No results for input(s): PROBNP in the last 8760 hours.    Other results:  Imaging: CT CHEST W CONTRAST  Result Date: 10/05/2021 CLINICAL DATA:  Hemoptysis EXAM: CT CHEST WITH CONTRAST TECHNIQUE: Multidetector CT imaging of the chest was performed during intravenous contrast administration. RADIATION DOSE REDUCTION: This exam was performed according to the departmental dose-optimization program which includes automated exposure control, adjustment of the mA and/or kV according to patient size and/or use  of iterative reconstruction technique. CONTRAST:  64mL OMNIPAQUE IOHEXOL 300 MG/ML  SOLN COMPARISON:  CT done on 06/10/2019, radiographs done on 10/04/2021 FINDINGS: Cardiovascular: Heart is enlarged in size. There is dilation of left ventricular cavity. There are scattered coronary artery calcifications. Contrast density in the thoracic aorta is less than adequate to evaluate the lumen. There are no intraluminal filling defects in the central pulmonary artery branches. Mediastinum/Nodes: Slightly enlarged lymph nodes in the mediastinum appear more prominent. Lungs/Pleura: There is interval appearance of small left pleural effusion. There is  new patchy infiltrate in the right middle lobe. There are faint ground-glass infiltrates in the left lower lobe. Upper Abdomen: Calcifications are seen in the renal artery branches. There is edema in the subcutaneous plane suggesting anasarca. Musculoskeletal: Degenerative changes are noted in the thoracic and lower cervical spine. There is encroachment of neural foramina at C5-C6 and C6-C7 levels in the lower cervical spine. IMPRESSION: There are new patchy infiltrates in the right middle lobe and left lower lobe suggesting multifocal pneumonia. Small left pleural effusion. Coronary artery calcifications are seen.  Heart is enlarged in size. Other findings as described in the body of the report. Electronically Signed   By: Elmer Picker M.D.   On: 10/05/2021 13:53   DG CHEST PORT 1 VIEW  Result Date: 10/07/2021 CLINICAL DATA:  Shortness of breath.  Pneumonia. EXAM: PORTABLE CHEST 1 VIEW COMPARISON:  10/06/2021 FINDINGS: Right arm PICC line remains in appropriate position. Moderate cardiac enlargement remains stable. Right lower lung airspace disease shows no significant change. Left lung is clear. No evidence of pleural effusion. IMPRESSION: Stable right lower lung airspace disease. Stable cardiomegaly. Electronically Signed   By: Marlaine Hind M.D.   On: 10/07/2021 10:03   DG CHEST PORT 1 VIEW  Result Date: 10/06/2021 CLINICAL DATA:  Shortness of breath EXAM: PORTABLE CHEST 1 VIEW COMPARISON:  Previous studies including the examination of 10/04/2021 FINDINGS: Transverse diameter of heart is increased. There is patchy infiltrate in the medial right lower lung fields partly obscuring the right cardiac margin with interval increase. Small linear densities seen in the medial left lower lung fields. Rest of the lung fields are clear. There is no pleural effusion or pneumothorax. Tip of PICC line introduced through the right upper extremity is seen in the superior vena cava close to the right atrium.  IMPRESSION: There is interval increase in infiltrate in medial right lower lung fields suggesting worsening of pneumonia in the right middle lobe. Small linear densities in the medial left lower lung fields may suggest subsegmental atelectasis or pneumonitis. Cardiomegaly. Electronically Signed   By: Elmer Picker M.D.   On: 10/06/2021 16:41   [place] EKG SITE RITE  Result Date: 10/05/2021 If [facility] image not attached, placement could not be confirmed due to current cardiac rhythm.    Medications:     Scheduled Medications:  Chlorhexidine Gluconate Cloth  6 each Topical Daily   empagliflozin  10 mg Oral Daily   furosemide  80 mg Intravenous BID   ipratropium-albuterol  3 mL Nebulization TID   potassium chloride  40 mEq Oral BID   rosuvastatin  5 mg Oral Daily   sodium chloride flush  10-40 mL Intracatheter Q12H    Infusions:  amiodarone 30 mg/hr (10/07/21 0525)   cefTRIAXone (ROCEPHIN)  IV 1 g (10/06/21 1612)   milrinone 0.25 mcg/kg/min (10/07/21 0408)    PRN Medications: acetaminophen **OR** acetaminophen, albuterol, sodium chloride flush   Assessment:   1. Acute on Chronic  Biventricular Systolic Heart Failure:  EF 15-20% with moderate to severe MR on echo 1/18.  Nonischemic cardiomyopathy. HTN vs cocaine use vs viral vs ETOH. HIV negative, SPEP with no M-spike.  Patient does have CAD but not enough to explain cardiomyopathy (last cath in 4/22 with 80% PLV stenosis).  Echo in 8/18 showed EF up to 50%. However, he stopped his meds and started cocaine again, echo in 4/22 with EF back down to 20-25% => suspect cocaine abuse may be the primary etiology of cardiomyopathy. Poor compliance w/ meds and f/u in the [facility]. Now admitted w/ NYHA Class IIIb symptoms and marked fluid overload. UDS negative. Echo EF 20-25%, RV mildly reduced. Worsening renal function w/ diuresis, SCr 1.66>>1.9, concerning for low output HF. Lasix increased to 80 IV bid with good effect. Co-ox remains 69% Weight  down 8 pounds in 2 days. CVP 9-10 - Remains fluid overloaded. continue IV Lasix @ 80IV bid. No need for inotropes currently but will follow co-ox closely.  - Continue TED hose - Caution w/ digoxin. If SCr continues to rise, will d/c (repeat BMP pending)  - Continue Jardiance 10 mg daily for now - No ARNi/ ARB or spiro w/ elevated SCr and low BP. Hopefully can add soon.  - no ? blocker w/ concern for low output  - w/ frequent runs of VT/ NSVT, recommend LifeVest at discharge    2. VT/NSVT: multiple runs today, longest was 28 beats - continue amio gtt - supp Mg (1.9) - Keep K >4.0 and Mg > 2.0  - HS trop trend not c/w ACS but if ongoing VT may need repeat LHC. Minimal CAD on cath 4/22 (1 RPL 80%, LAD 25%m) - LifeVest at discharge    3. AKI on CKD IIIb - baseline SCr 1.6 - SCr trend 1.66>>1.99 -> 2.02 -> 1.97 -> 1.6 - cardiorenal/ concern for low output but co-ox ok so far - monitor w/ diuresis  - follow BMP closely  - avoid hypotension    4. Hemoptysis  - CT w/ Contrast not suggestive of PE - may be 2/2 pulmonary edema vs PNA - Pulmonary following. Considering bronch - follow H/H    5. ? Multifocal PNA  - Chest CT w/ patchy infiltrates in the right middle lobe and left lower lobe, concerning for possible PNA but AF w/ normal WBC - on empiric abx w/ ceftriaxone  - PCT < 0.1 suspect mostly just CHF   6. Hypomagnesemia - Mg 1.9 - Supp w/ MgSO4 - Keep Mg >2.0 w/ VT    7. CAD - 80% stenosis PLV on cath in 4/22.  No s/s ischemia - Hs trop 39>>43, no evidence ACS - continue medical therapy, ASA + statin    8. H/o Substance Abuse - prior cocaine history   Length of Stay: 3   Glori Bickers MD 10/07/2021, 11:20 AM  Advanced Heart Failure Team Pager [phone] (M-F; [place])  Please contact [facility] for night-coverage after hours (4p -7a ) and weekends on [email or web address]

## 2021-10-07 NOTE — Progress Notes (Signed)
D-Dimer 2.18,  no s/s MD notified and aware, will continue to monitor, Thanks Arvella Nigh.

## 2021-10-07 NOTE — Progress Notes (Signed)
Telemetry called primary RN and stated patient had 7 beats of Vtach. Then Telemetry called charge nurse and stated patient was having 13 beats of Vtach. When RN entered room, patient had just finished voiding in urinal- emptied 948mL and cleaned up urine spilled on floor.

## 2021-10-07 NOTE — Progress Notes (Signed)
° °  NAME:  Edward Mullen, MRN:  [id number], DOB:  03-Oct-1955, LOS: 3 ADMISSION DATE:  10/04/2021, CONSULTATION DATE:  10/07/2021 REFERRING MD:  Claris Gower, CHIEF COMPLAINT:  Hemoptysis   History of Present Illness:  66 yo male with hx of tobacco abuse, marijuana abuse, and cocaine abuse developed cough with clear sputum, shortness of breath and leg swelling.  Admitted for CHF exacerbation.  Started coughing up blood with last episode earlier this afternoon.  Denies epistaxis or hematemesis.  Not aware of having difficulty swallowing.  Reports last time he smoked or used substances was few weeks ago when he started having more trouble with his breathing.  Not having fever or weight loss, but has intermittent sweats.   Pertinent  Medical History  HTN, DM, chronic systolic HF, polysubstance abuse, NSVT  Significant Hospital Events: Including procedures, antibiotic start and stop dates in addition to other pertinent events   2/23 admitted for HF. IV lasix given  2/24 new hemoptysis. CT chest new patchy infiltrates in the right middle lobe and left lower lobe suggesting multifocal pneumonia. Small left pleural effusion. LMWH stopped. Started on rocephin. ECHO 20-25% W/ GLOBAL HYPOKINESIS grade 2 diastolic dysfxn. UDS neg  2/25 pulm asked to see for cxr progression and cont hemoptysis   Interim History / Subjective:  Coughed up blood clots.  Breathing okay.  Still feels congested in his chest.  Objective   Blood pressure 96/70, pulse 64, temperature (!) 97.5 F (36.4 C), temperature source Oral, resp. rate 17, height 6' (1.829 m), weight 91.2 kg, SpO2 96 %. CVP:  [9 mmHg-15 mmHg] 10 mmHg      Intake/Output Summary (Last 24 hours) at 10/07/2021 1107 Last data filed at 10/07/2021 1013 Gross per 24 hour  Intake 1001.06 ml  Output 4809 ml  Net -3807.94 ml   Filed Weights   10/05/21 0500 10/06/21 0430 10/07/21 0412  Weight: 95.1 kg 93.6 kg 91.2 kg    Examination:  General - alert Eyes  - pupils reactive ENT - no sinus tenderness, no stridor Cardiac - regular rate/rhythm, no murmur Chest - Rt > Lt crackles Abdomen - soft, non tender, + bowel sounds Extremities - no cyanosis, clubbing, or edema Skin - no rashes Neuro - normal strength, moves extremities, follows commands Psych - normal mood and behavior  CXR - no change Rt infiltrate  Assessment & Plan:   Hemoptysis with progressive Rt sided infiltrate. - concern he could have CAP or aspiration pneumonitis - agree with antibiotic - seems to be coughing up old blood now - if progresses, then might need bronchoscopy  - f/u CXR intermittently - hold lovenox and ASA for now   Small Lt pleural effusion. - likely in setting of acute on chronic systolic CHF - continue lasix  - f/u CXR  NICM with acute on chronic systolic CHF. CKD 3a. CAD. Hx of NSVT. - per primary team and CHF team  Signature:  Chesley Mires, MD [place] Pager - [phone] 10/07/2021, 11:11 AM

## 2021-10-07 NOTE — Progress Notes (Signed)
HD#3 Subjective:  Overnight Events: No acute events overnight  Continues to have hemoptysis and pain over right chest wall.  Continues to endorse improvement in his shortness of breath.  He is able to ambulate from the bed to the bathroom without being short of breath.  Endorses frequent urination feels as though his lower extremity swelling has improved significantly.  Discussed with him the plan to continue the antibiotic for his hemoptysis as well as continue diuretic therapy.  He had no other acute concerns or questions.  Objective:  Vital signs in last 24 hours: Vitals:   10/06/21 2001 10/06/21 2025 10/07/21 0049 10/07/21 0412  BP: 94/68  103/75 106/62  Pulse: 70 66 67 88  Resp: 18 20 18 19   Temp: 98.1 F (36.7 C)  98.1 F (36.7 C) 97.8 F (36.6 C)  TempSrc: Oral  Oral Oral  SpO2: 98% 100% 94% 92%  Weight:    91.2 kg  Height:       Supplemental O2: Nasal Cannula SpO2: 92 % O2 Flow Rate (L/min): 2 L/min  Physical Exam:  Constitutional: Elderly appearing, no acute distress HENT: normocephalic atraumatic, mucous membranes moist.  Poor dentition Eyes: conjunctiva non-erythematous Neck: supple Cardiovascular: regular rate and rhythm, no m/r/g.  Pulmonary/Chest: normal work of breathing on room air, crackles in the lung bases.  Rhonchorous breath sounds at right middle lung. Abdominal: soft, non-tender, non-distended MSK: normal bulk and tone.  Trace lower extremity edema.  TED hose in place Neurological: alert & oriented x 3 Skin: warm and dry Psych: Normal mood and thought processes     Filed Weights   10/05/21 0500 10/06/21 0430 10/07/21 0412  Weight: 95.1 kg 93.6 kg 91.2 kg     Intake/Output Summary (Last 24 hours) at 10/07/2021 0643 Last data filed at 10/07/2021 0524 Gross per 24 hour  Intake 1410.5 ml  Output 3809 ml  Net -2398.5 ml    Net IO Since Admission: -4,873.01 mL [10/07/21 0643]  Pertinent Labs: CBC Latest Ref Rng & Units 10/07/2021  10/06/2021 10/06/2021  WBC 4.0 - 10.5 K/uL 10.2 11.0(H) 12.1(H)  Hemoglobin 13.0 - 17.0 g/dL 12.6(L) 12.3(L) 12.7(L)  Hematocrit 39.0 - 52.0 % 38.3(L) 38.6(L) 39.0  Platelets 150 - 400 K/uL 149(L) 155 158    CMP Latest Ref Rng & Units 10/07/2021 10/06/2021 10/05/2021  Glucose 70 - 99 mg/dL 238(H) 102(H) 111(H)  BUN 8 - 23 mg/dL 24(H) 34(H) 31(H)  Creatinine 0.61 - 1.24 mg/dL 1.60(H) 1.97(H) 2.02(H)  Sodium 135 - 145 mmol/L 136 139 139  Potassium 3.5 - 5.1 mmol/L 3.2(L) 3.9 4.1  Chloride 98 - 111 mmol/L 97(L) 104 104  CO2 22 - 32 mmol/L 27 23 25   Calcium 8.9 - 10.3 mg/dL 7.9(L) 8.5(L) 8.7(L)  Total Protein 6.5 - 8.1 g/dL - - -  Total Bilirubin 0.3 - 1.2 mg/dL - - -  Alkaline Phos 38 - 126 U/L - - -  AST 15 - 41 U/L - - -  ALT 0 - 44 U/L - - -    Imaging: DG CHEST PORT 1 VIEW  Result Date: 10/06/2021 CLINICAL DATA:  Shortness of breath EXAM: PORTABLE CHEST 1 VIEW COMPARISON:  Previous studies including the examination of 10/04/2021 FINDINGS: Transverse diameter of heart is increased. There is patchy infiltrate in the medial right lower lung fields partly obscuring the right cardiac margin with interval increase. Small linear densities seen in the medial left lower lung fields. Rest of the lung fields are clear. There is  no pleural effusion or pneumothorax. Tip of PICC line introduced through the right upper extremity is seen in the superior vena cava close to the right atrium. IMPRESSION: There is interval increase in infiltrate in medial right lower lung fields suggesting worsening of pneumonia in the right middle lobe. Small linear densities in the medial left lower lung fields may suggest subsegmental atelectasis or pneumonitis. Cardiomegaly. Electronically Signed   By: Edward Mullen M.D.   On: 10/06/2021 16:41    Assessment/Plan:   Principal Problem:   Acute exacerbation of CHF (congestive heart failure) (HCC) Active Problems:   Polysubstance abuse ([place])   Hemoptysis   Type 2  diabetes mellitus with stage 3b chronic kidney disease, without long-term current use of insulin ([place])  Patient Summary: Edward Mullen is a 66 y.o. with a pertinent PMH of hypertension, diabetes, chronic systolic heart failure, history of NSVT, history of polysubstance abuse who presented with shortness of breath, lower extremity swelling, orthopnea, dyspnea on exertion and admitted for heart failure exacerbation.  He is diuresing well, heart failure team following and assisting in his care.  Milrinone started yesterday with hopes of improvement of his cardiac output.  Acute on chronic heart failure Nonischemic cardiomyopathy Patient continues to diurese well, weight down 3.8 kg from admission. Trace lower extremity edema, significant improvement.  Patient no longer dyspneic with minimal exertion.  Co-ox 68.5% unchanged from yesterday. Dr. Haroldine Mullen following, appreciate his assistance in [name]'s care.    -Heart failure team following -80 Lasix IV twice daily -Milrinone 0.25 mcg/kg/min continue -Jardiance 10 mg daily -Klor-Con tab 40 mg twice daily, 2 g mag today. -Continue to trend electrolytes -Continue to hold beta-blocker, Entresto.  Restart per heart failure team -PT consulted  Multiple pulmonary infiltrates  Hemoptysis Patient has remained hemodynamically stable and hemoglobin stable at 12.  Differential includes pneumonia versus pneumonitis. Procalcitonin negative, less likely infectious.  Overall unclear etiology at this time.  We will continue antibiotics.  Appreciate pulmonary's assistance in his care.  D-dimer ordered yesterday as patient was having blood oozing from IV catheter sites.  This was elevated however low suspicion for pulmonary embolism at this time.  -Continue to trend CBCs -Ceftriaxone day 3 of 5 -Pulmonology following, appreciate their assistance in his care   Acute on chronic kidney injury CKD 3a Patient's creatinine back to baseline after diuresis.   Jardiance restarted.  We will continue to trend and supplement electrolytes as needed. -Avoid nephrotoxic medications -Daily BMP  VT/NSVT Continues to have episodes of nonsustained V. tach on telemetry.  Continue Amio gtt. -Heart failure team recommending LifeVest at discharge  CAD Denies chest pain this morning.  History of 80% stenosed PLV on cath. -Continue aspirin 81 mg daily, Crestor 5 mg daily  Suspected COPD -No wheezing on exam today, continue DuoNebs as needed -Will need outpatient PFTs  Diet: Heart Healthy IVF: None,None VTE: SCDs/TED hose Code: Full PT/OT recs: Pending  Dispo: Anticipated discharge home pending further management of his acute heart failure exacerbation  Sanjuana Letters DO Internal Medicine Resident PGY-2 Pager [phone] Please contact the on call pager after 5 pm and on weekends at [phone].

## 2021-10-08 ENCOUNTER — Encounter: Payer: Self-pay | Admitting: Cardiology

## 2021-10-08 DIAGNOSIS — I5023 Acute on chronic systolic (congestive) heart failure: Secondary | ICD-10-CM | POA: Diagnosis not present

## 2021-10-08 DIAGNOSIS — E1122 Type 2 diabetes mellitus with diabetic chronic kidney disease: Secondary | ICD-10-CM

## 2021-10-08 DIAGNOSIS — N1832 Chronic kidney disease, stage 3b: Secondary | ICD-10-CM

## 2021-10-08 LAB — CBC
HCT: 39 % (ref 39.0–52.0)
Hemoglobin: 12.9 g/dL — ABNORMAL LOW (ref 13.0–17.0)
MCH: 29 pg (ref 26.0–34.0)
MCHC: 33.1 g/dL (ref 30.0–36.0)
MCV: 87.6 fL (ref 80.0–100.0)
Platelets: 147 10*3/uL — ABNORMAL LOW (ref 150–400)
RBC: 4.45 MIL/uL (ref 4.22–5.81)
RDW: 15.6 % — ABNORMAL HIGH (ref 11.5–15.5)
WBC: 8.4 10*3/uL (ref 4.0–10.5)
nRBC: 0 % (ref 0.0–0.2)

## 2021-10-08 LAB — BASIC METABOLIC PANEL
Anion gap: 8 (ref 5–15)
BUN: 17 mg/dL (ref 8–23)
CO2: 31 mmol/L (ref 22–32)
Calcium: 8.1 mg/dL — ABNORMAL LOW (ref 8.9–10.3)
Chloride: 98 mmol/L (ref 98–111)
Creatinine, Ser: 1.6 mg/dL — ABNORMAL HIGH (ref 0.61–1.24)
GFR, Estimated: 47 mL/min — ABNORMAL LOW (ref >60)
Glucose, Bld: 192 mg/dL — ABNORMAL HIGH (ref 70–99)
Potassium: 3.4 mmol/L — ABNORMAL LOW (ref 3.5–5.1)
Sodium: 137 mmol/L (ref 135–145)

## 2021-10-08 LAB — COOXEMETRY PANEL
Carboxyhemoglobin: 1.4 % (ref 0.5–1.5)
Carboxyhemoglobin: 1.1 % (ref 0.5–1.5)
Methemoglobin: <0.7 % (ref 0.0–1.5)
Methemoglobin: 1.6 % — ABNORMAL HIGH (ref 0.0–1.5)
O2 Saturation: 59.2 %
O2 Saturation: 49.9 %
Total hemoglobin: 13.3 g/dL (ref 12.0–16.0)
Total hemoglobin: 13.4 g/dL (ref 12.0–16.0)

## 2021-10-08 LAB — MAGNESIUM: Magnesium: 2 mg/dL (ref 1.7–2.4)

## 2021-10-08 MED ORDER — DIGOXIN 125 MCG PO TABS
0.1250 mg | ORAL_TABLET | Freq: Every day | ORAL | Status: DC
  Administered 2021-10-08 – 2021-10-11 (×4): 0.125 mg via ORAL
  Filled 2021-10-08 (×4): qty 1

## 2021-10-08 MED ORDER — MILRINONE LACTATE IN DEXTROSE 20-5 MG/100ML-% IV SOLN
0.1250 ug/kg/min | INTRAVENOUS | Status: DC
  Administered 2021-10-08: 0.125 ug/kg/min via INTRAVENOUS

## 2021-10-08 MED ORDER — FUROSEMIDE 40 MG PO TABS
40.0000 mg | ORAL_TABLET | Freq: Every day | ORAL | Status: DC
  Administered 2021-10-08 – 2021-10-09 (×2): 40 mg via ORAL
  Filled 2021-10-08 (×2): qty 1

## 2021-10-08 MED ORDER — IPRATROPIUM-ALBUTEROL 0.5-2.5 (3) MG/3ML IN SOLN
3.0000 mL | Freq: Two times a day (BID) | RESPIRATORY_TRACT | Status: DC
  Administered 2021-10-08 – 2021-10-11 (×6): 3 mL via RESPIRATORY_TRACT
  Filled 2021-10-08 (×6): qty 3

## 2021-10-08 MED ORDER — POTASSIUM CHLORIDE CRYS ER 20 MEQ PO TBCR
30.0000 meq | EXTENDED_RELEASE_TABLET | Freq: Once | ORAL | Status: AC
  Administered 2021-10-08: 30 meq via ORAL
  Filled 2021-10-08: qty 1

## 2021-10-08 MED ORDER — SPIRONOLACTONE 12.5 MG HALF TABLET
12.5000 mg | ORAL_TABLET | Freq: Every day | ORAL | Status: DC
  Administered 2021-10-08: 12.5 mg via ORAL
  Filled 2021-10-08: qty 1

## 2021-10-08 NOTE — Progress Notes (Addendum)
Advanced Heart Failure Rounding Note   Subjective:    Admitted w/ PNA and a/c CHF w/ low output. PICC placed 2/24. Co-ox 43%. Started on milrinone.   Remains on milrinone 0.25. Co-ox 59%   Good diuresis over the weekend, diuresed >8L. Wt down 15 lb total. CVP 6   AKI improving, SCr peaked at 2.02>>1.97>>1.60>1.60   Remains on abx, ceftriaxone, for PNA. AF. WBC nl, 10.2 PCT <0.10   + hemoptysis. Pulmonology following. ? 2/2 CAP vs aspiration pneumonitis Holding Lovenox and ASA.  H/H stable.   Remains on amio gtt for runs of NSVT. 13 beat run on tele review. Had 28 beat run on 2/24.  K 3.4  Mg 2.0   He feels better today. Dyspnea improved. Still w/ hemoptysis.   Objective:   Weight Range:  Vital Signs:   Temp:  [97.5 F (36.4 C)-98.2 F (36.8 C)] 98.2 F (36.8 C) (02/27 0500) Pulse Rate:  [64-74] 74 (02/27 0500) Resp:  [17-20] 18 (02/27 0500) BP: (90-98)/(66-78) 97/66 (02/27 0500) SpO2:  [92 %-96 %] 93 % (02/27 0500) Weight:  [88.4 kg] 88.4 kg (02/27 0500) Last BM Date : 10/07/21  Weight change: Filed Weights   10/06/21 0430 10/07/21 0412 10/08/21 0500  Weight: 93.6 kg 91.2 kg 88.4 kg    Intake/Output:   Intake/Output Summary (Last 24 hours) at 10/08/2021 0720 Last data filed at 10/08/2021 0548 Gross per 24 hour  Intake 1792.8 ml  Output 6176 ml  Net -4383.2 ml     PHYSICAL EXAM: CVP 6  General:  Well appearing. No respiratory difficulty HEENT: normal Neck: supple. JVD 6 cm. Carotids 2+ bilat; no bruits. No lymphadenopathy or thyromegaly appreciated. Cor: PMI nondisplaced. Regular rate & rhythm. No rubs, gallops or murmurs. Lungs: decreased BS at the bases bilaterally  Abdomen: soft, nontender, nondistended. No hepatosplenomegaly. No bruits or masses. Good bowel sounds. Extremities: no cyanosis, clubbing, rash, edema + TED, + RUE PICC  Neuro: alert & oriented x 3, cranial nerves grossly intact. moves all 4 extremities w/o difficulty. Affect  pleasant.   Telemetry: NSR 70s, runs of NSVT, longest 13 beats Personally reviewed  Labs: Basic Metabolic Panel: Recent Labs  Lab 10/05/21 0517 10/05/21 1511 10/06/21 0238 10/07/21 0500 10/08/21 0538  NA 140 139 139 136 137  K 4.0 4.1 3.9 3.2* 3.4*  CL 109 104 104 97* 98  CO2 20* 25 23 27 31   GLUCOSE 115* 111* 102* 238* 192*  BUN 28* 31* 34* 24* 17  CREATININE 1.93* 2.02* 1.97* 1.60* 1.60*  CALCIUM 8.5* 8.7* 8.5* 7.9* 8.1*  MG 1.7  --  1.9 1.9 2.0    Liver Function Tests: Recent Labs  Lab 10/05/21 0517  AST 36  ALT 29  ALKPHOS 63  BILITOT 0.9  PROT 5.5*  ALBUMIN 2.8*   No results for input(s): LIPASE, AMYLASE in the last 168 hours. No results for input(s): AMMONIA in the last 168 hours.  CBC: Recent Labs  Lab 10/04/21 1153 10/05/21 0517 10/06/21 1137 10/06/21 1843 10/07/21 0500  WBC 7.1 5.2 12.1* 11.0* 10.2  NEUTROABS  --  4.5 9.2*  --  7.5  HGB 14.3 12.9* 12.7* 12.3* 12.6*  HCT 47.3 41.0 39.0 38.6* 38.3*  MCV 93.8 89.7 88.0 88.7 87.6  PLT 188 144* 158 155 149*    Cardiac Enzymes: No results for input(s): CKTOTAL, CKMB, CKMBINDEX, TROPONINI in the last 168 hours.  BNP: BNP (last 3 results) Recent Labs    11/28/20 1042 10/04/21 1153  BNP 2,565.3* 3,768.9*    ProBNP (last 3 results) No results for input(s): PROBNP in the last 8760 hours.    Other results:  Imaging: DG CHEST PORT 1 VIEW  Result Date: 10/07/2021 CLINICAL DATA:  Shortness of breath.  Pneumonia. EXAM: PORTABLE CHEST 1 VIEW COMPARISON:  10/06/2021 FINDINGS: Right arm PICC line remains in appropriate position. Moderate cardiac enlargement remains stable. Right lower lung airspace disease shows no significant change. Left lung is clear. No evidence of pleural effusion. IMPRESSION: Stable right lower lung airspace disease. Stable cardiomegaly. Electronically Signed   By: Marlaine Hind M.D.   On: 10/07/2021 10:03   DG CHEST PORT 1 VIEW  Result Date: 10/06/2021 CLINICAL DATA:   Shortness of breath EXAM: PORTABLE CHEST 1 VIEW COMPARISON:  Previous studies including the examination of 10/04/2021 FINDINGS: Transverse diameter of heart is increased. There is patchy infiltrate in the medial right lower lung fields partly obscuring the right cardiac margin with interval increase. Small linear densities seen in the medial left lower lung fields. Rest of the lung fields are clear. There is no pleural effusion or pneumothorax. Tip of PICC line introduced through the right upper extremity is seen in the superior vena cava close to the right atrium. IMPRESSION: There is interval increase in infiltrate in medial right lower lung fields suggesting worsening of pneumonia in the right middle lobe. Small linear densities in the medial left lower lung fields may suggest subsegmental atelectasis or pneumonitis. Cardiomegaly. Electronically Signed   By: Elmer Picker M.D.   On: 10/06/2021 16:41     Medications:     Scheduled Medications:  Chlorhexidine Gluconate Cloth  6 each Topical Daily   empagliflozin  10 mg Oral Daily   furosemide  80 mg Intravenous BID   ipratropium-albuterol  3 mL Nebulization TID   potassium chloride  40 mEq Oral BID   rosuvastatin  5 mg Oral Daily   sodium chloride flush  10-40 mL Intracatheter Q12H    Infusions:  amiodarone 30 mg/hr (10/08/21 0405)   cefTRIAXone (ROCEPHIN)  IV 1 g (10/07/21 1529)   milrinone 0.25 mcg/kg/min (10/07/21 1634)    PRN Medications: acetaminophen **OR** acetaminophen, albuterol, sodium chloride flush   Assessment:   1. Acute on Chronic Biventricular Systolic Heart Failure:  EF 15-20% with moderate to severe MR on echo 1/18.  Nonischemic cardiomyopathy. HTN vs cocaine use vs viral vs ETOH. HIV negative, SPEP with no M-spike.  Patient does have CAD but not enough to explain cardiomyopathy (last cath in 4/22 with 80% PLV stenosis).  Echo in 8/18 showed EF up to 50%. However, he stopped his meds and started cocaine again,  echo in 4/22 with EF back down to 20-25% => suspect cocaine abuse may be the primary etiology of cardiomyopathy. Poor compliance w/ meds and f/u in the [facility]. Now admitted w/ NYHA Class IIIb symptoms and marked fluid overload. UDS negative. Echo EF 20-25%, RV mildly reduced. Initially w/ worsening renal function w/ diuresis, SCr 1.66>>1.9>>2.0, concerning for low output HF. PICC placed 2/24, Initial Co-ox 43%, Lactic acid 2.1. Started on milrinone 0.25. Co-ox improved + improved diuresis, wt down 15 lb. CVP 6. Co-ox 59% today. AKI improving, SCr 1.6 today c/w baseline.  - Stop IV Lasix, Transition to PO Lasix 40 mg daily  - Wean Milrinone to 0.125  - Digoxin held initially w/ AKI, now improved and SCr back to baseline. Will restart 0.125 mg daily. Follow level - Continue Jardiance 10 mg daily - Add Spiro 12.5  mg daily  - BP too soft for Entresto restart  - no ? blocker w/ low output  - w/ frequent runs of VT/ NSVT, recommend LifeVest at discharge. D/w pt and he is agreeable   - Not a candidate for advanced therapies including home inotropes given h/o polysubstance use and poor compliance  - recommend paramedicine at d/c    2. VT/NSVT: multiple runs this admit, longest was 28 beats - continue amio gtt until off milrinone, then transition back to PO - supp K (3.4), Mg ok 2.0  - Keep K >4.0 and Mg > 2.0  - HS trop trend not c/w ACS but if ongoing VT may need repeat LHC  - LifeVest at discharge    3. AKI on CKD IIIb - baseline SCr 1.6 - cardiorenal/ low output, improved w/ milrinone + diuresis  - SCr trend 1.66>>1.99 -> 2.02 -> 1.97->1.60>1.60  - monitor w/ milrinone wean   - follow BMP closely  - avoid hypotension    4. Hemoptysis  - CT w/ Contrast not suggestive of PE, no evidence of nodules/masses  - may be 2/2  PNA vs aspiration pneumonitis  - pulmonology following - if continues, may need bronchoscopy  - H/H stable    5. ? Multifocal PNA  - Chest CT w/ patchy infiltrates in the  right middle lobe and left lower lobe, concerning for possible PNA but AF w/ normal WBC. PCT <0.10  - on empiric abx w/ ceftriaxone  - pulmonology following    6. Hypomagnesemia/ Hypokalemia  - Mg improved, 2.0 today  - K 3.4  - Supp K and add spiro  - Keep Mg >2.0 w/ VT    7. CAD - 80% stenosis PLV on cath in 4/22.  No s/s ischemia  - Hs trop 39>>43, c/w demand ischemia - continue statin  - holding ASA w/ hemoptysis - off ? blocker w/ low output    8. H/o Substance Abuse - prior cocaine history   Length of Stay: [place] PA-C  10/08/2021, 7:20 AM  Advanced Heart Failure Team Pager [phone] (M-F; [place])  Please contact [facility] for night-coverage after hours (4p -7a ) and weekends on [email or web address]  Patient seen with PA, agree with the above note.    He is doing better today.  Co-ox 59%, CVP 6.  He has diuresed well.  Remains on milrinone 0.25. Creatinine down to 1.6.   He is on amiodarone gtt, last NSVT was on 2/26.   General: NAD Neck: No JVD, no thyromegaly or thyroid nodule.  Lungs: Clear to auscultation bilaterally with normal respiratory effort. CV: Nondisplaced PMI.  Heart regular S1/S2, no S3/S4, no murmur.  No peripheral edema.   Abdomen: Soft, nontender, no hepatosplenomegaly, no distention.  Skin: Intact without lesions or rashes.  Neurologic: Alert and oriented x 3.  Psych: Normal affect. Extremities: No clubbing or cyanosis.  HEENT: Normal.   Decrease milrinone to 0.125.  Will stop IV Lasix with CVP 6, transition to Lasix 40 mg po daily.  Continue Jardiance 10 daily, restart digoxin 0.125 daily, add spironolactone 12.5 daily  No further NSVT, can transition amiodarone to po when off amiodarone.  Should wear Lifevest at discharge.  If he can stay off cocaine, should be CRT-D candidate.   He continues on ceftriaxone for possible CAP, pulmonary following.   Would like LHC/RHC after he is weaned off milrinone if creatinine remains stable  (frequent NSVT, PVCs).    Lonald Troiani [employer]  10/08/2021 1:51 PM

## 2021-10-08 NOTE — Care Management Important Message (Signed)
Important Message  Patient Details  Name: Edward Mullen MRN: [id number] Date of Birth: 1955/08/30   Medicare Important Message Given:  Yes     Shelda Altes 10/08/2021, 9:31 AM

## 2021-10-08 NOTE — TOC Progression Note (Signed)
Transition of Care [facility]) - Progression Note    Patient Details  Name: Edward Mullen MRN: [id number] Date of Birth: October 01, 1955  [facility] CM/SW Contact  Zenon Mayo, RN Phone Number: 10/08/2021, 10:27 AM  Clinical Narrative:     NCM received call from Liana Gerold from [place] for life vest, this NCM faxed demographis, h/p, echo, cath report, life vest order and progress note to [phone].        Expected Discharge Plan and Services                                                 Social Determinants of Health (SDOH) Interventions    Readmission Risk Interventions Readmission Risk Prevention Plan 06/10/2019  Transportation Screening Complete  PCP or Specialist Appt within 5-7 Days Complete  Home Care Screening Complete  Medication Review (RN CM) Complete  Some recent data might be hidden

## 2021-10-08 NOTE — Plan of Care (Signed)
Problem: Activity: Goal: Capacity to carry out activities will improve Outcome: Completed/Met   Problem: Activity: Goal: Capacity to carry out activities will improve Outcome: Completed/Met

## 2021-10-08 NOTE — Progress Notes (Signed)
Physical Therapy Treatment Patient Details Name: Edward Mullen MRN: [id number] DOB: 12-01-1955 Today's Date: 10/08/2021   History of Present Illness 66 y.o. male presents to [facility] on 10/04/2021 with SOB, orthopnea, LE swelling. Pt admitted for CHF exacerbation. PMH includes HTN, DMII, CHF, NSVT, polysubstance abuse.    PT Comments    The pt was able to demo good progress with ambulation endurance this session, pt with no need for standing or seated rest with activity. However, pt with slightly impaired awareness today compared to eval as he was soiled in BM upon my arrival and unaware. The pt continues to demo good stability and power for sit-stand transfers, but will continue to benefit from skilled PT to improve endurance and dynamic stability.   Gait Speed: 0.57m/s using SPC. (Gait speed <0.1m/s indicates increased risk of falls and dependence in ADLs)    Recommendations for follow up therapy are one component of a multi-disciplinary discharge planning process, led by the attending physician.  Recommendations may be updated based on patient status, additional functional criteria and insurance authorization.  Follow Up Recommendations  No PT follow up     Assistance Recommended at Discharge PRN  Patient can return home with the following A little help with bathing/dressing/bathroom;Assistance with cooking/housework   Equipment Recommendations  None recommended by PT    Recommendations for Other Services       Precautions / Restrictions Precautions Precautions: Fall Precaution Comments: monitor SpO2 Restrictions Weight Bearing Restrictions: No     Mobility  Bed Mobility Overal bed mobility: Modified Independent             General bed mobility comments: HOB elevated    Transfers Overall transfer level: Modified independent Equipment used: Straight cane               General transfer comment: pt able to complete multiple times without assist or  instability    Ambulation/Gait Ambulation/Gait assistance: Supervision Gait Distance (Feet): 175 Feet Assistive device: Straight cane Gait Pattern/deviations: Step-through pattern Gait velocity: 0.42 m/s Gait velocity interpretation: 1.31 - 2.62 ft/sec, indicative of limited community ambulator   General Gait Details: pt with slowed movements but generally stable       Balance Overall balance assessment: Needs assistance Sitting-balance support: No upper extremity supported, Feet supported Sitting balance-Leahy Scale: Good     Standing balance support: During functional activity, Single extremity supported Standing balance-Leahy Scale: Poor Standing balance comment: reaching for BUE at times with static standing, able to ambulate with single UE support for gait                            Cognition Arousal/Alertness: Awake/alert Behavior During Therapy: WFL for tasks assessed/performed Overall Cognitive Status: Impaired/Different from baseline Area of Impairment: Awareness                           Awareness: Intellectual   General Comments: pt soiled of BM upon arrival and unaware. mildly decreased safety awareness and poor ability to self-monitor exertion        Exercises      General Comments General comments (skin integrity, edema, etc.): VSS on RA. low of 90% when good reading      Pertinent Vitals/Pain Pain Assessment Pain Assessment: No/denies pain     PT Goals (current goals can now be found in the care plan section) Acute Rehab PT Goals Patient Stated Goal: to  return home and improve activity tolerance PT Goal Formulation: With patient Time For Goal Achievement: 10/20/21 Potential to Achieve Goals: Good Progress towards PT goals: Progressing toward goals    Frequency    Min 3X/week      PT Plan Current plan remains appropriate       AM-PAC PT "6 Clicks" Mobility   Outcome Measure  Help needed turning from your  back to your side while in a flat bed without using bedrails?: None Help needed moving from lying on your back to sitting on the side of a flat bed without using bedrails?: None Help needed moving to and from a bed to a chair (including a wheelchair)?: None Help needed standing up from a chair using your arms (e.g., wheelchair or bedside chair)?: None Help needed to walk in hospital room?: A Little Help needed climbing 3-5 steps with a railing? : A Little 6 Click Score: 22    End of Session Equipment Utilized During Treatment: Gait belt Activity Tolerance: Patient tolerated treatment well Patient left: in chair;with call bell/phone within reach;with nursing/sitter in room Nurse Communication: Mobility status PT Visit Diagnosis: Other abnormalities of gait and mobility (R26.89)     Time: 4709-2957 PT Time Calculation (min) (ACUTE ONLY): 25 min  Charges:  $Therapeutic Exercise: 8-22 mins $Therapeutic Activity: 8-22 mins                     [place] Carbo, PT, DPT   Acute Rehabilitation Department Pager #: [phone]   Sandra Cockayne 10/08/2021, 3:46 PM

## 2021-10-08 NOTE — Progress Notes (Signed)
° °  NAME:  Edward Mullen, MRN:  [id number], DOB:  1956-02-08, LOS: 4 ADMISSION DATE:  10/04/2021, CONSULTATION DATE:  10/07/2021 REFERRING MD:  Claris Gower, CHIEF COMPLAINT:  Hemoptysis   History of Present Illness:  66 yo male with hx of tobacco abuse, marijuana abuse, and cocaine abuse developed cough with clear sputum, shortness of breath and leg swelling.  Admitted for CHF exacerbation.  Started coughing up blood with last episode earlier this afternoon.  Denies epistaxis or hematemesis.  Not aware of having difficulty swallowing.  Reports last time he smoked or used substances was few weeks ago when he started having more trouble with his breathing.  Not having fever or weight loss, but has intermittent sweats.   Pertinent  Medical History  HTN, DM, chronic systolic HF, polysubstance abuse, NSVT  Significant Hospital Events: Including procedures, antibiotic start and stop dates in addition to other pertinent events   2/23 admitted for HF. IV lasix given  2/24 new hemoptysis. CT chest new patchy infiltrates in the right middle lobe and left lower lobe suggesting multifocal pneumonia. Small left pleural effusion. LMWH stopped. Started on rocephin. ECHO 20-25% W/ GLOBAL HYPOKINESIS grade 2 diastolic dysfxn. UDS neg  2/25 pulm asked to see for cxr progression and cont hemoptysis   Interim History / Subjective:  Hemoptysis thicker but unchanged per his report.  Breathing okay.  Still feels congested in his chest.  Objective   Blood pressure 91/73, pulse 71, temperature 98 F (36.7 C), temperature source Oral, resp. rate 18, height 6' (1.829 m), weight 88.4 kg, SpO2 94 %. CVP:  [3 mmHg-13 mmHg] 13 mmHg      Intake/Output Summary (Last 24 hours) at 10/08/2021 1128 Last data filed at 10/08/2021 1120 Gross per 24 hour  Intake 1982.8 ml  Output 5176 ml  Net -3193.2 ml    Filed Weights   10/06/21 0430 10/07/21 0412 10/08/21 0500  Weight: 93.6 kg 91.2 kg 88.4 kg     Examination:  General - alert Eyes - pupils reactive ENT - no sinus tenderness, no stridor Cardiac - regular rate/rhythm, no murmur Chest - Rt > Lt crackles Abdomen - soft, non tender, + bowel sounds Extremities - no cyanosis, clubbing, or edema Skin - no rashes Neuro - normal strength, moves extremities, follows commands Psych - normal mood and behavior  CXR - no change Rt infiltrate  Assessment & Plan:   Hemoptysis with progressive Rt sided infiltrate. - concern he could have CAP or aspiration pneumonitis (although initial PCT is negative), possible asymmetric edema - RML abnormality on CT present on CXR for dating back April 2022 - agree with antibiotic - seems to be coughing up old blood now - given heart failure with inotrope support he is poor candidate for bronchoscopy - hold lovenox and ASA for now   Small Lt pleural effusion. - likely in setting of acute on chronic systolic CHF - continue lasix  - f/u CXR shows improvement  NICM with acute on chronic systolic CHF. CKD 3a. CAD. Hx of NSVT. - per primary team and CHF team  Signature:  Lanier Clam, MD [place] for contact info 10/08/2021, 11:28 AM

## 2021-10-08 NOTE — Progress Notes (Signed)
Mobility Specialist Progress Note:   10/08/21 1100  Mobility  Activity Ambulated with assistance in hallway  Level of Assistance Contact guard assist, steadying assist  Assistive Device Four point cane  Distance Ambulated (ft) 150 ft  Activity Response Tolerated well  $Mobility charge 1 Mobility   Pt agreeable to mobility session at this time. Required CGA d/t unsteady gait with FPC. Pt c/o bilateral leg pain, otherwise asx. Pt back in bed with all needs met.   Nelta Numbers Acute Rehab Phone: [phone] Office Phone: [phone]

## 2021-10-08 NOTE — Progress Notes (Signed)
HD#4 SUBJECTIVE:  Patient Summary: Edward Mullen is a 66 y.o. with a pertinent PMH of hypertension, diabetes, chronic systolic heart failure, history of NSVT, history of polysubstance abuse who presented with shortness of breath, lower extremity swelling, orthopnea, dyspnea on exertion and admitted for heart failure exacerbation.    Overnight Events: None  Interim History: Patient feels "so-so" today, overall better from when he initially came in for evaluation. His breathing has not changed over the last few days but he is able to ambulate around his room without worsening of dyspnea. He continues to have hemoptysis, with the consistency now being clot-like (was more mucous at the start).  OBJECTIVE:  Vital Signs: Vitals:   10/07/21 1452 10/07/21 1944 10/08/21 0000 10/08/21 0500  BP:  90/74 98/78 97/66   Pulse:  72 66 74  Resp: 18 20 18 18   Temp:  97.9 F (36.6 C) 97.8 F (36.6 C) 98.2 F (36.8 C)  TempSrc:  Oral Oral Oral  SpO2: 95% 95% 92% 93%  Weight:    88.4 kg  Height:       Supplemental O2: Room Air SpO2: 93 % O2 Flow Rate (L/min): 2 L/min  Filed Weights   10/06/21 0430 10/07/21 0412 10/08/21 0500  Weight: 93.6 kg 91.2 kg 88.4 kg     Intake/Output Summary (Last 24 hours) at 10/08/2021 0618 Last data filed at 10/08/2021 0548 Gross per 24 hour  Intake 1792.8 ml  Output 6176 ml  Net -4383.2 ml   Net IO Since Admission: -9,249.02 mL [10/08/21 0618]  Physical Exam: Constitutional:Chronically-ill appearing gentleman resting comfortably in bed. No acute distress. Cardio:Regular rate and rhythm. No murmurs, rubs, gallops. Pulm:Bilateral crackles. Appropriate O2 saturations on room air. DPO:[id number] 1+ pitting edema. Neuro:Alert and oriented x3. No focal deficit noted.  Psych:Normal mood and affect.  Patient Lines/Drains/Airways Status     Active Line/Drains/Airways     Name Placement date Placement time Site Days   Peripheral IV 10/04/21 20 G  Anterior;Left;Proximal Forearm 10/04/21  1426  Forearm  4   PICC Double Lumen 43/15/40 Right Basilic 44 cm 0 cm 08/67/61  2054  -- 3   Incision (Closed) 06/08/19 Back Other (Comment) 06/08/19  1358  -- 853             ASSESSMENT/PLAN:  Assessment: Principal Problem:   Acute exacerbation of CHF (congestive heart failure) (HCC) Active Problems:   Polysubstance abuse ([place])   Hemoptysis   Type 2 diabetes mellitus with stage 3b chronic kidney disease, without long-term current use of insulin ([place])   Plan: Edward Mullen is a 66 y.o. with a pertinent PMH of hypertension, diabetes, chronic systolic heart failure, history of NSVT, history of polysubstance abuse who presented with shortness of breath, lower extremity swelling, orthopnea, dyspnea on exertion and admitted for heart failure exacerbation.    Acute on chronic heart failure Nonischemic cardiomyopathy Patient is diuresing well with net -4.4L and -2.5 kg overnight. AM Xo-ox was 59.2%, down from 68.5% 02/26.    -Heart failure team following, appreciate their assistance -Transition to Lasix 40 mg PO daily -Start spironolactone 12.5 mg PO daily -Wean milrinone to 0.125 mcg/kg/min -Jardiance 10 mg daily -Klor-Con tab 40 mg twice daily -Continue to trend electrolytes, replete as needed -Continue to hold beta-blocker 2/2 low output, Entresto 2/2 BP.  Restart per heart failure team. -PT consulted   Multiple pulmonary infiltrates  Hemoptysis Hemoptysis persists and patient states that it has not improved, rather that the frequency and amount has  stayed about the same. He states it is more clot-like now whereas initially it was blood-tinged mucus. Suspicion continues to be low for PE. -Pulmonology consulted, appreciate their assistance  -Patient is a poor bronchoscopy candidate due to heart failure requiring inotrope support  -Felt that his hemoptysis is old blood at this point -Ceftriaxone day 4 of 5 -Hold Lovenox, ASA -Trend  CBC   Acute on chronic kidney injury CKD 3a Patient's creatinine back to baseline after diuresis.  -Continue Jardiance 10 mg daily -Trend BMP. Replete as necessary. -Avoid nephrotoxic medications   VT/NSVT Continues to have episodes of nonsustained V. tach on telemetry.   -Continue amiodarone gtt -Heart failure team recommending LifeVest at discharge   CAD Denies chest pain this morning.  History of 80% stenosed PLV on cath. -Continue aspirin 81 mg daily, Crestor 5 mg daily   Suspected COPD Wheezing noted on physical exam. He denies worsening of symptoms at this time. -Continue DuoNebs as needed -Will need outpatient PFTs  Best Practice: Diet: Heart Healthy IVF: None VTE: SCDs/TED hose Code: Full PT/OT recs: Pending Dispo: Anticipated discharge home pending further management of his acute heart failure exacerbation  Signature: Edward Mullen, D.O.  Internal Medicine Resident, PGY-1 [facility]  Pager: [phone] 6:18 AM, 10/08/2021   Please contact the on call pager after 5 pm and on weekends at [phone].

## 2021-10-09 DIAGNOSIS — R042 Hemoptysis: Secondary | ICD-10-CM | POA: Diagnosis not present

## 2021-10-09 DIAGNOSIS — N1832 Chronic kidney disease, stage 3b: Secondary | ICD-10-CM | POA: Diagnosis not present

## 2021-10-09 DIAGNOSIS — E1122 Type 2 diabetes mellitus with diabetic chronic kidney disease: Secondary | ICD-10-CM | POA: Diagnosis not present

## 2021-10-09 DIAGNOSIS — I5023 Acute on chronic systolic (congestive) heart failure: Secondary | ICD-10-CM | POA: Diagnosis not present

## 2021-10-09 LAB — CBC
HCT: 41.9 % (ref 39.0–52.0)
Hemoglobin: 13.9 g/dL (ref 13.0–17.0)
MCH: 29.1 pg (ref 26.0–34.0)
MCHC: 33.2 g/dL (ref 30.0–36.0)
MCV: 87.8 fL (ref 80.0–100.0)
Platelets: 166 10*3/uL (ref 150–400)
RBC: 4.77 MIL/uL (ref 4.22–5.81)
RDW: 15.7 % — ABNORMAL HIGH (ref 11.5–15.5)
WBC: 8.5 10*3/uL (ref 4.0–10.5)
nRBC: 0 % (ref 0.0–0.2)

## 2021-10-09 LAB — COOXEMETRY PANEL
Carboxyhemoglobin: 1.7 % — ABNORMAL HIGH (ref 0.5–1.5)
Methemoglobin: <0.7 % (ref 0.0–1.5)
O2 Saturation: 66.8 %
Total hemoglobin: 13.5 g/dL (ref 12.0–16.0)

## 2021-10-09 LAB — BASIC METABOLIC PANEL
Anion gap: 7 (ref 5–15)
BUN: 14 mg/dL (ref 8–23)
CO2: 28 mmol/L (ref 22–32)
Calcium: 8.1 mg/dL — ABNORMAL LOW (ref 8.9–10.3)
Chloride: 101 mmol/L (ref 98–111)
Creatinine, Ser: 1.43 mg/dL — ABNORMAL HIGH (ref 0.61–1.24)
GFR, Estimated: 54 mL/min — ABNORMAL LOW (ref >60)
Glucose, Bld: 178 mg/dL — ABNORMAL HIGH (ref 70–99)
Potassium: 4.4 mmol/L (ref 3.5–5.1)
Sodium: 136 mmol/L (ref 135–145)

## 2021-10-09 LAB — PATHOLOGIST SMEAR REVIEW

## 2021-10-09 LAB — MAGNESIUM: Magnesium: 2 mg/dL (ref 1.7–2.4)

## 2021-10-09 MED ORDER — SPIRONOLACTONE 25 MG PO TABS
25.0000 mg | ORAL_TABLET | Freq: Every day | ORAL | Status: DC
  Administered 2021-10-09 – 2021-10-11 (×3): 25 mg via ORAL
  Filled 2021-10-09 (×3): qty 1

## 2021-10-09 MED ORDER — POTASSIUM CHLORIDE CRYS ER 20 MEQ PO TBCR
40.0000 meq | EXTENDED_RELEASE_TABLET | Freq: Every day | ORAL | Status: DC
  Administered 2021-10-09 – 2021-10-10 (×2): 40 meq via ORAL
  Filled 2021-10-09 (×2): qty 2

## 2021-10-09 MED ORDER — TRANEXAMIC ACID FOR INHALATION
500.0000 mg | Freq: Once | RESPIRATORY_TRACT | Status: AC
  Administered 2021-10-09: 500 mg via RESPIRATORY_TRACT
  Filled 2021-10-09: qty 10

## 2021-10-09 NOTE — Progress Notes (Addendum)
Mobility Specialist Progress Note:   10/09/21 1020  Mobility  Activity Ambulated with assistance in hallway  Level of Assistance Contact guard assist, steadying assist  Assistive Device Cane  Distance Ambulated (ft) 400 ft  Activity Response Tolerated well  $Mobility charge 1 Mobility   Pt eager to participate in mobility this am. CGA required d/t slightly unsteady gait. Pt with x1 minor LOB, pt able to self correct. Required x1 standing rest break d/t SOB, SpO2 >90% throughout. Pt back in bed with all needs met.   N[facility] Phone: [phone] Office Phone: [phone]

## 2021-10-09 NOTE — Progress Notes (Addendum)
Pt was complaining of SOB and intermittent chest pain sharp pains 5/10. EKG performed and pt given scheduled breathing treatment. O2 applied to pt at 2L as well. Pt stated after treatment issue is resolved and no longer having intermittent chest pain.   Informed pt to leave O2 in place and to notify nurse of any further issues. Dr. Lorin Glass paged about pt. Notified Dr. Alfred Levins as well with cardiology.

## 2021-10-09 NOTE — Progress Notes (Addendum)
Advanced Heart Failure Rounding Note   Subjective:    Admitted w/ PNA and a/c CHF w/ low output. PICC placed 2/24. Co-ox 43%. Started on milrinone.  2/27: Milrinone decreased to 0.125. IV lasix stopped>>transitioned to PO.  Co-ox 67% on 0.125 milrinone  CVP 6. 1.7L UOP yesterday + unmeasured voids. Dyspnea and LE edema much improved. No CP.   Ambulated halls yesterday with PT.  Scr trending down, 2.02>1.60>1.43  Hemoptysis still present but volume decreasing. Pulmonology following.  Holding Lovenox and ASA.  H/H stable.   Remains on amio gtt for runs of NSVT. Had 2 runs yesterday afternoon, longest 13 beats.  LifeVest in room.   Objective:   Vital Signs:   Temp:  [97.6 F (36.4 C)-98.2 F (36.8 C)] 98.2 F (36.8 C) (02/28 0425) Pulse Rate:  [71-78] 73 (02/28 0425) Resp:  [17-20] 20 (02/28 0425) BP: (91-110)/(68-82) 103/72 (02/28 0425) SpO2:  [94 %-97 %] 95 % (02/28 0425) Weight:  [88.9 kg] 88.9 kg (02/28 0425) Last BM Date : 10/07/21  Weight change: Filed Weights   10/07/21 0412 10/08/21 0500 10/09/21 0425  Weight: 91.2 kg 88.4 kg 88.9 kg    Intake/Output:   Intake/Output Summary (Last 24 hours) at 10/09/2021 0751 Last data filed at 10/09/2021 0400 Gross per 24 hour  Intake 1320 ml  Output 1725 ml  Net -405 ml     PHYSICAL EXAM: CVP 6 General:  No distress. Lying comfortably in bed. HEENT: poor dentition Neck: supple. No JVD. Carotids 2+ bilat; no bruits.  Cor: PMI nondisplaced. Regular rate & rhythm. No rubs, gallops or murmurs. Lungs: clear Abdomen: soft, nontender, nondistended.  Extremities: no cyanosis, clubbing, rash, + TED hose, + RUE PICC Neuro: alert & orientedx3, cranial nerves grossly intact. moves all 4 extremities w/o difficulty. Affect pleasant    Telemetry: SR 70s, 2 runs NSVT yesterday longest 13 beats  Labs: Basic Metabolic Panel: Recent Labs  Lab 10/05/21 0517 10/05/21 1511 10/06/21 0238 10/07/21 0500 10/08/21 0538  10/09/21 0600  NA 140 139 139 136 137 136  K 4.0 4.1 3.9 3.2* 3.4* 4.4  CL 109 104 104 97* 98 101  CO2 20* 25 23 27 31 28   GLUCOSE 115* 111* 102* 238* 192* 178*  BUN 28* 31* 34* 24* 17 14  CREATININE 1.93* 2.02* 1.97* 1.60* 1.60* 1.43*  CALCIUM 8.5* 8.7* 8.5* 7.9* 8.1* 8.1*  MG 1.7  --  1.9 1.9 2.0 2.0    Liver Function Tests: Recent Labs  Lab 10/05/21 0517  AST 36  ALT 29  ALKPHOS 63  BILITOT 0.9  PROT 5.5*  ALBUMIN 2.8*   No results for input(s): LIPASE, AMYLASE in the last 168 hours. No results for input(s): AMMONIA in the last 168 hours.  CBC: Recent Labs  Lab 10/05/21 0517 10/06/21 1137 10/06/21 1843 10/07/21 0500 10/08/21 0936  WBC 5.2 12.1* 11.0* 10.2 8.4  NEUTROABS 4.5 9.2*  --  7.5  --   HGB 12.9* 12.7* 12.3* 12.6* 12.9*  HCT 41.0 39.0 38.6* 38.3* 39.0  MCV 89.7 88.0 88.7 87.6 87.6  PLT 144* 158 155 149* 147*    Cardiac Enzymes: No results for input(s): CKTOTAL, CKMB, CKMBINDEX, TROPONINI in the last 168 hours.  BNP: BNP (last 3 results) Recent Labs    11/28/20 1042 10/04/21 1153  BNP 2,565.3* 3,768.9*    ProBNP (last 3 results) No results for input(s): PROBNP in the last 8760 hours.    Other results:  Imaging: DG CHEST PORT 1  VIEW  Result Date: 10/07/2021 CLINICAL DATA:  Shortness of breath.  Pneumonia. EXAM: PORTABLE CHEST 1 VIEW COMPARISON:  10/06/2021 FINDINGS: Right arm PICC line remains in appropriate position. Moderate cardiac enlargement remains stable. Right lower lung airspace disease shows no significant change. Left lung is clear. No evidence of pleural effusion. IMPRESSION: Stable right lower lung airspace disease. Stable cardiomegaly. Electronically Signed   By: Marlaine Hind M.D.   On: 10/07/2021 10:03     Medications:     Scheduled Medications:  Chlorhexidine Gluconate Cloth  6 each Topical Daily   digoxin  0.125 mg Oral Daily   empagliflozin  10 mg Oral Daily   furosemide  40 mg Oral Daily   ipratropium-albuterol  3  mL Nebulization BID   potassium chloride  40 mEq Oral BID   rosuvastatin  5 mg Oral Daily   sodium chloride flush  10-40 mL Intracatheter Q12H   spironolactone  12.5 mg Oral Daily    Infusions:  amiodarone 30 mg/hr (10/09/21 0525)   cefTRIAXone (ROCEPHIN)  IV 1 g (10/08/21 1634)   milrinone 0.125 mcg/kg/min (10/08/21 0905)    PRN Medications: acetaminophen **OR** acetaminophen, albuterol, sodium chloride flush   Assessment:   1. Acute on Chronic Biventricular Systolic Heart Failure:  EF 15-20% with moderate to severe MR on echo 1/18.  Nonischemic cardiomyopathy. HTN vs cocaine use vs viral vs ETOH. HIV negative, SPEP with no M-spike.  Patient does have CAD but not enough to explain cardiomyopathy (last cath in 4/22 with 80% PLV stenosis).  Echo in 8/18 showed EF up to 50%. However, he stopped his meds and started cocaine again, echo in 4/22 with EF back down to 20-25% => suspect cocaine abuse may be the primary etiology of cardiomyopathy. Poor compliance w/ meds and f/u in the [facility]. -Now admitted w/ NYHA Class IIIb symptoms and marked fluid overload. UDS negative. Echo EF 20-25%, RV mildly reduced. Initially w/ worsening renal function w/ diuresis, SCr 1.66>>1.9>>2.0, concerning for low output HF. PICC placed 2/24, Initial Co-ox 43%, Lactic acid 2.1. Started on milrinone 0.25. Co-ox improved + improved diuresis.  - Milrinone decreased to 0.125 on 02/27. Co-ox 67%, will stop milrinone today. - Volume stable. CVP 6. Continue PO Lasix 40 mg daily  - Continue digoxin 0.125 - Continue Jardiance 10 mg daily - Continue spiro 12.5 mg daily - BP too soft for Entresto restart. Consider low-dose losartan prior to discharge. - no ? blocker w/ low output  - w/ frequent runs of VT/ NSVT, recommend LifeVest at discharge. LifeVest delivered last night. - Not a candidate for advanced therapies including home inotropes given h/o polysubstance use and poor compliance  - recommend paramedicine at d/c  [facility] tomorrow - CR consult   2. VT/NSVT: multiple runs this admit, longest was 28 beats - continue amio gtt today. If milrinone wean is successful, will stop amio gtt tomorrow and restart po amio. - Keep K >4.0 and Mg > 2.0  - HS trop trend not c/w ACS. D/t recurrent NSVT will arrange for [facility] tomorrow. - LifeVest at discharge    3. AKI on CKD IIIb - baseline SCr 1.6 - cardiorenal/ low output, improved w/ milrinone + diuresis  - SCr trend 1.66>>1.99 -> 2.02 -> 1.97->1.60>1.60>1.43  - monitor w/ milrinone wean   - follow BMP closely  - avoid hypotension    4. Hemoptysis  - CT w/ Contrast not suggestive of PE, no evidence of nodules/masses  - may be 2/2  PNA  vs aspiration pneumonitis  - pulmonology following - not a good candidate for bronchoscopy - H/H stable    5. ? Multifocal PNA  - Chest CT w/ patchy infiltrates in the right middle lobe and left lower lobe, concerning for possible PNA but AF w/ normal WBC. PCT <0.10  - on empiric abx w/ ceftriaxone  - pulmonology following    6. Hypomagnesemia/ Hypokalemia  - Mg improved, 2.0 today  - K 4.4  - Continue spiro  - Keep Mg >2.0 w/ VT    7. CAD - 80% stenosis PLV on cath in 4/22.  No s/s ischemia  - Hs trop 39>>43, c/w demand ischemia - R/LHC as above d/t recurrent NSVT - continue statin  - holding ASA w/ hemoptysis - off ? blocker w/ low output    8. H/o Substance Abuse - prior cocaine history   Length of Stay: 5   FINCH, LINDSAY N PA-C  10/09/2021, 7:51 AM  Advanced Heart Failure Team Pager [phone] (M-F; 7a - 4p)  Please contact [facility] for night-coverage after hours (4p -7a ) and weekends on [email or web address]  Patient seen with PA, agree with the above note.   Still with hemoptysis, pulmonary following.  Has RML infiltrate, ground glass on CT.   Creatinine lower at 1.43.  Remains on milrinone 0.125 with co-ox 67%.  CVP 6.   No dyspnea.   Remains on amiodarone gtt, still with NSVT runs.    General: NAD Neck: JVP 7-8 cm, no thyromegaly or thyroid nodule.  Lungs: Rhonchi. CV: Nondisplaced PMI.  Heart regular S1/S2, no S3/S4, no murmur.  No peripheral edema.   Abdomen: Soft, nontender, no hepatosplenomegaly, no distention.  Skin: Intact without lesions or rashes.  Neurologic: Alert and oriented x 3.  Psych: Normal affect. Extremities: No clubbing or cyanosis.  HEENT: Normal.   Discussed with pulmonary, still with hemoptysis.  Continue ceftriaxone to treat possible PNA with RML infiltrate.  Will need outpatient pulmonary followup to decide on bronchoscopy.   Stop milrinone today with good co-ox.  Continue po Lasix with CVP 6.  Will increase spironolactone to 25 mg daily.   If he can stay off cocaine, should be CRT-D candidate.   Ongoing NSVT runs, stopping milrinone today and hopefully will improve. Continue IV amiodarone today, probably to po tomorrow.  Will aim for [facility] tomorrow with frequent PVCs and NSVT.  Discussed risks/benefits with patient and he agrees to procedure. Lifevest planned at discharge.   Loralie Champagne 10/09/2021 9:05 AM

## 2021-10-09 NOTE — Progress Notes (Signed)
HD#5 SUBJECTIVE:  Patient Summary:  Edward Mullen is a 66 y.o. with a pertinent PMH of hypertension, diabetes, chronic systolic heart failure, history of NSVT, history of polysubstance abuse who presented with shortness of breath, lower extremity swelling, orthopnea, dyspnea on exertion and admitted for heart failure exacerbation.    Overnight Events: None  Interim History: Patient is feeling well overall. His breathing is improved and no longer bothers him with exertion and he is able to lay flat at night to sleep. His hemoptysis is no longer clot-appearing and overall he is coughing less. LE edema markedly improved.  OBJECTIVE:  Vital Signs: Vitals:   10/08/21 1545 10/08/21 2057 10/08/21 2105 10/09/21 0425  BP: 97/71 110/82  103/72  Pulse: 73 78  73  Resp: 20 20  20   Temp: 97.7 F (36.5 C) 98 F (36.7 C)  98.2 F (36.8 C)  TempSrc: Oral Oral  Oral  SpO2: 94% 96% 97% 95%  Weight:    88.9 kg  Height:       Supplemental O2: Room Air SpO2: 95 % O2 Flow Rate (L/min): 2 L/min  Filed Weights   10/07/21 0412 10/08/21 0500 10/09/21 0425  Weight: 91.2 kg 88.4 kg 88.9 kg     Intake/Output Summary (Last 24 hours) at 10/09/2021 0718 Last data filed at 10/09/2021 0400 Gross per 24 hour  Intake 1320 ml  Output 1725 ml  Net -405 ml   Net IO Since Admission: -9,654.02 mL [10/09/21 0718]  Physical Exam: Constitutional:Chronically-ill appearing gentleman resting comfortably in bed. No acute distress. Cardio:Regular rate and rhythm. No murmurs, rubs, gallops. Pulm:Diffuse crackles, improved from previous days. Abdomen:Soft, nontender, nondistended. NAT:[id number] for extremity edema. Skin:Warm and dry. Neuro:Alert and oriented x3. No focal deficit noted. Psych:Normal mood and affect.  Patient Lines/Drains/Airways Status     Active Line/Drains/Airways     Name Placement date Placement time Site Days   Peripheral IV 10/04/21 20 G Anterior;Left;Proximal Forearm 10/04/21  1426   Forearm  5   PICC Double Lumen 54/27/06 Right Basilic 44 cm 0 cm 23/76/28  2054  -- 4   Incision (Closed) 06/08/19 Back Other (Comment) 06/08/19  1358  -- 854             ASSESSMENT/PLAN:  Assessment: Principal Problem:   Acute exacerbation of CHF (congestive heart failure) (HCC) Active Problems:   Polysubstance abuse (HCC)   Acute on chronic systolic heart failure ([place])   Hemoptysis   Type 2 diabetes mellitus with stage 3b chronic kidney disease, without long-term current use of insulin ([place])   Plan: Edward Mullen is a 66 y.o. with a pertinent PMH of hypertension, diabetes, chronic systolic heart failure, history of NSVT, history of polysubstance abuse who presented with shortness of breath, lower extremity swelling, orthopnea, dyspnea on exertion and admitted for heart failure exacerbation.    Acute on chronic biventricular systolic heart failure, NYHA Class IIIb Nonischemic cardiomyopathy Net -9.6L and -6.1 kg this admission; minimal change over last 24 hours. AM co-ox 66.8%, improved from 59.2% 02/27.   -Heart failure team following, appreciate their assistance  -[facility] tomorrow 03/01 -Lasix 40 mg PO daily -Spironolactone 12.5 mg PO daily -Discontinue milrinone today -Digoxin 0.125 mg daily -Jardiance 10 mg daily -Continue to hold beta-blocker 2/2 low output, Entresto 2/2 BP.  Consider low-dose losartan prior to discharge. -Klor-Con tab 40 mg twice daily -Continue to trend electrolytes, replete as needed -PT consulted   Multiple pulmonary infiltrates  Hemoptysis Hemoptysis persists and patient states that  it consistency is no longer clot-like, and that he has had decreased coughing frequency since yesterday. -Pulmonology consulted, appreciate their assistance             -Patient is a poor bronchoscopy candidate due to heart failure requiring inotrope support -Ceftriaxone day 5 of 5 -Hold Lovenox, ASA -Trend CBC   Acute on chronic kidney injury CKD  3a Patient's creatinine back to baseline after diuresis.  -Continue Jardiance 10 mg daily -Trend BMP. Replete as necessary. -Avoid nephrotoxic medications   VT/NSVT Continues to have episodes of nonsustained V. tach on telemetry.   -Continue amiodarone gtt -LifeVest at bedside   CAD Denies chest pain this morning.  History of 80% stenosed PLV on cath. -Continue aspirin 81 mg daily, Crestor 5 mg daily   Suspected COPD Wheezing improved over course of admission thus far. -Continue DuoNebs as needed -Will need outpatient PFTs  Best Practice: Diet: Heart Healthy IVF: None VTE: SCDs/TED hose Code: Full PT/OT recs: Pending Dispo: Anticipated discharge home pending further management of his acute heart failure exacerbation.  Signature: Farrel Gordon, D.O.  Internal Medicine Resident, PGY-1 [facility]  Pager: [phone] 7:18 AM, 10/09/2021   Please contact the on call pager after 5 pm and on weekends at [phone].

## 2021-10-09 NOTE — TOC CM/SW Note (Addendum)
HF TOC CM sent referral for HF Paramedicine team to see in community. Pt reports he lives at home with dtr. Lifevest in room. Has RW, cane and CPAP.  States [employer], PCP provides transportation to appts. [place], Heart Failure TOC CM [phone]

## 2021-10-09 NOTE — Progress Notes (Signed)
° °  NAME:  Edward Mullen, MRN:  [id number], DOB:  1955-09-04, LOS: 5 ADMISSION DATE:  10/04/2021, CONSULTATION DATE:  10/07/2021 REFERRING MD:  Claris Gower, CHIEF COMPLAINT:  Hemoptysis   History of Present Illness:  66 yo male with hx of tobacco abuse, marijuana abuse, and cocaine abuse developed cough with clear sputum, shortness of breath and leg swelling.  Admitted for CHF exacerbation.  Started coughing up blood with last episode earlier this afternoon.  Denies epistaxis or hematemesis.  Not aware of having difficulty swallowing.  Reports last time he smoked or used substances was few weeks ago when he started having more trouble with his breathing.  Not having fever or weight loss, but has intermittent sweats.   Pertinent  Medical History  HTN, DM, chronic systolic HF, polysubstance abuse, NSVT  Significant Hospital Events: Including procedures, antibiotic start and stop dates in addition to other pertinent events   2/23 admitted for HF. IV lasix given  2/24 new hemoptysis. CT chest new patchy infiltrates in the right middle lobe and left lower lobe suggesting multifocal pneumonia. Small left pleural effusion. LMWH stopped. Started on rocephin. ECHO 20-25% W/ GLOBAL HYPOKINESIS grade 2 diastolic dysfxn. UDS neg  2/25 pulm asked to see for cxr progression and cont hemoptysis   Interim History / Subjective:  Cough same. Has cup with thick mucous mixed with streaks of blood on bedside table.  Objective   Blood pressure (!) 90/54, pulse 72, temperature 97.6 F (36.4 C), temperature source Oral, resp. rate 17, height 6' (1.829 m), weight 88.9 kg, SpO2 97 %. CVP:  [5 mmHg-8 mmHg] 8 mmHg      Intake/Output Summary (Last 24 hours) at 10/09/2021 1018 Last data filed at 10/09/2021 6962 Gross per 24 hour  Intake 840 ml  Output 2625 ml  Net -1785 ml    Filed Weights   10/07/21 0412 10/08/21 0500 10/09/21 0425  Weight: 91.2 kg 88.4 kg 88.9 kg    Examination:  General - alert Eyes -  pupils reactive ENT - no sinus tenderness, no stridor Cardiac - regular rate/rhythm, no murmur Chest - Rt > Lt crackles Abdomen - soft, non tender, + bowel sounds Extremities - no cyanosis, clubbing, or edema Skin - no rashes Neuro - normal strength, moves extremities, follows commands Psych - normal mood and behavior  CXR - no change Rt infiltrate  Assessment & Plan:   Hemoptysis with progressive Rt sided infiltrate. - concern he could have CAP or aspiration pneumonitis (although initial PCT is negative), possible asymmetric edema - RML abnormality on CT appears possibly present on CXR dating back April 2022 - agree with antibiotic - nebulized TXA today - given heart failure with inotrope support he is poor candidate for bronchoscopy currently but can re-evaluate in coming days - hold lovenox and ASA for now   Small Lt pleural effusion. - likely in setting of acute on chronic systolic CHF - f/u CXR shows improvement  NICM with acute on chronic systolic CHF. CKD 3a. CAD. Hx of NSVT. - per primary team and CHF team  Signature:  Lanier Clam, MD [place] for contact info 10/09/2021, 10:18 AM

## 2021-10-10 ENCOUNTER — Encounter (HOSPITAL_COMMUNITY): Payer: Self-pay | Admitting: Cardiology

## 2021-10-10 ENCOUNTER — Encounter (HOSPITAL_COMMUNITY)
Admission: EM | Disposition: A | Discharge status: Home / Self Care | Payer: Self-pay | Source: Home / Self Care | Attending: Internal Medicine

## 2021-10-10 DIAGNOSIS — I5023 Acute on chronic systolic (congestive) heart failure: Secondary | ICD-10-CM | POA: Diagnosis not present

## 2021-10-10 DIAGNOSIS — I2510 Atherosclerotic heart disease of native coronary artery without angina pectoris: Secondary | ICD-10-CM

## 2021-10-10 HISTORY — PX: RIGHT/LEFT HEART CATH AND CORONARY ANGIOGRAPHY: CATH118266

## 2021-10-10 LAB — BASIC METABOLIC PANEL
Anion gap: 7 (ref 5–15)
BUN: 15 mg/dL (ref 8–23)
CO2: 25 mmol/L (ref 22–32)
Calcium: 8.1 mg/dL — ABNORMAL LOW (ref 8.9–10.3)
Chloride: 105 mmol/L (ref 98–111)
Creatinine, Ser: 1.41 mg/dL — ABNORMAL HIGH (ref 0.61–1.24)
GFR, Estimated: 55 mL/min — ABNORMAL LOW (ref >60)
Glucose, Bld: 147 mg/dL — ABNORMAL HIGH (ref 70–99)
Potassium: 4 mmol/L (ref 3.5–5.1)
Sodium: 137 mmol/L (ref 135–145)

## 2021-10-10 LAB — COOXEMETRY PANEL
Carboxyhemoglobin: 2.2 % — ABNORMAL HIGH (ref 0.5–1.5)
Carboxyhemoglobin: 1.3 % (ref 0.5–1.5)
Methemoglobin: 0.8 % (ref 0.0–1.5)
Methemoglobin: 1.2 % (ref 0.0–1.5)
O2 Saturation: 99.1 %
O2 Saturation: 60.6 %
Total hemoglobin: <6.9 g/dL — CL (ref 12.0–16.0)
Total hemoglobin: 14.2 g/dL (ref 12.0–16.0)

## 2021-10-10 LAB — POCT I-STAT EG7
Acid-Base Excess: 2 mmol/L (ref 0.0–2.0)
Acid-Base Excess: 1 mmol/L (ref 0.0–2.0)
Bicarbonate: 26.2 mmol/L (ref 20.0–28.0)
Bicarbonate: 25.9 mmol/L (ref 20.0–28.0)
Calcium, Ion: 1.18 mmol/L (ref 1.15–1.40)
Calcium, Ion: 1.19 mmol/L (ref 1.15–1.40)
HCT: 43 % (ref 39.0–52.0)
HCT: 43 % (ref 39.0–52.0)
Hemoglobin: 14.6 g/dL (ref 13.0–17.0)
Hemoglobin: 14.6 g/dL (ref 13.0–17.0)
O2 Saturation: 58 %
O2 Saturation: 58 %
Potassium: 4.5 mmol/L (ref 3.5–5.1)
Potassium: 4.5 mmol/L (ref 3.5–5.1)
Sodium: 138 mmol/L (ref 135–145)
Sodium: 138 mmol/L (ref 135–145)
TCO2: 27 mmol/L (ref 22–32)
TCO2: 27 mmol/L (ref 22–32)
pCO2, Ven: 40.4 mmHg — ABNORMAL LOW (ref 44–60)
pCO2, Ven: 40.3 mmHg — ABNORMAL LOW (ref 44–60)
pH, Ven: 7.42 (ref 7.25–7.43)
pH, Ven: 7.415 (ref 7.25–7.43)
pO2, Ven: 30 mmHg — CL (ref 32–45)
pO2, Ven: 30 mmHg — CL (ref 32–45)

## 2021-10-10 LAB — CBC
HCT: 41.7 % (ref 39.0–52.0)
Hemoglobin: 13.2 g/dL (ref 13.0–17.0)
MCH: 28 pg (ref 26.0–34.0)
MCHC: 31.7 g/dL (ref 30.0–36.0)
MCV: 88.5 fL (ref 80.0–100.0)
Platelets: 169 10*3/uL (ref 150–400)
RBC: 4.71 MIL/uL (ref 4.22–5.81)
RDW: 15.7 % — ABNORMAL HIGH (ref 11.5–15.5)
WBC: 8.5 10*3/uL (ref 4.0–10.5)
nRBC: 0 % (ref 0.0–0.2)

## 2021-10-10 SURGERY — RIGHT/LEFT HEART CATH AND CORONARY ANGIOGRAPHY
Anesthesia: LOCAL

## 2021-10-10 MED ORDER — LOSARTAN POTASSIUM 25 MG PO TABS
12.5000 mg | ORAL_TABLET | Freq: Every day | ORAL | Status: DC
  Administered 2021-10-11: 12.5 mg via ORAL
  Filled 2021-10-10 (×2): qty 1

## 2021-10-10 MED ORDER — HEPARIN (PORCINE) IN NACL 1000-0.9 UT/500ML-% IV SOLN
INTRAVENOUS | Status: DC | PRN
  Administered 2021-10-10 (×2): 500 mL

## 2021-10-10 MED ORDER — IOHEXOL 350 MG/ML SOLN
INTRAVENOUS | Status: DC | PRN
  Administered 2021-10-10: 45 mL

## 2021-10-10 MED ORDER — FENTANYL CITRATE (PF) 100 MCG/2ML IJ SOLN
INTRAMUSCULAR | Status: DC | PRN
  Administered 2021-10-10: 25 ug via INTRAVENOUS

## 2021-10-10 MED ORDER — VERAPAMIL HCL 2.5 MG/ML IV SOLN
INTRAVENOUS | Status: DC | PRN
  Administered 2021-10-10: 10 mL via INTRA_ARTERIAL

## 2021-10-10 MED ORDER — LABETALOL HCL 5 MG/ML IV SOLN: 10.0000 mg | INTRAVENOUS | Status: AC | PRN

## 2021-10-10 MED ORDER — HYDRALAZINE HCL 20 MG/ML IJ SOLN: 10.0000 mg | INTRAMUSCULAR | Status: AC | PRN

## 2021-10-10 MED ORDER — ONDANSETRON HCL 4 MG/2ML IJ SOLN: 4.0000 mg | Freq: Four times a day (QID) | INTRAMUSCULAR | Status: DC | PRN

## 2021-10-10 MED ORDER — VERAPAMIL HCL 2.5 MG/ML IV SOLN
INTRAVENOUS | Status: AC
  Filled 2021-10-10: qty 2

## 2021-10-10 MED ORDER — ASPIRIN 81 MG PO CHEW
81.0000 mg | CHEWABLE_TABLET | ORAL | Status: AC
  Administered 2021-10-10: 81 mg via ORAL
  Filled 2021-10-10: qty 1

## 2021-10-10 MED ORDER — HEPARIN SODIUM (PORCINE) 1000 UNIT/ML IJ SOLN
INTRAMUSCULAR | Status: DC | PRN
  Administered 2021-10-10: 4500 [IU] via INTRAVENOUS

## 2021-10-10 MED ORDER — SODIUM CHLORIDE 0.9 % IV SOLN: 250.0000 mL | INTRAVENOUS | Status: DC | PRN

## 2021-10-10 MED ORDER — ENOXAPARIN SODIUM 40 MG/0.4ML IJ SOSY: 40.0000 mg | PREFILLED_SYRINGE | INTRAMUSCULAR | Status: DC

## 2021-10-10 MED ORDER — HEPARIN (PORCINE) IN NACL 1000-0.9 UT/500ML-% IV SOLN
INTRAVENOUS | Status: AC
  Filled 2021-10-10: qty 1000

## 2021-10-10 MED ORDER — LOSARTAN POTASSIUM 25 MG PO TABS: 12.5000 mg | ORAL_TABLET | Freq: Every day | ORAL | Status: DC

## 2021-10-10 MED ORDER — TORSEMIDE 20 MG PO TABS
40.0000 mg | ORAL_TABLET | Freq: Every day | ORAL | Status: DC
  Administered 2021-10-11: 40 mg via ORAL
  Filled 2021-10-10 (×2): qty 2

## 2021-10-10 MED ORDER — FENTANYL CITRATE (PF) 100 MCG/2ML IJ SOLN
INTRAMUSCULAR | Status: AC
  Filled 2021-10-10: qty 2

## 2021-10-10 MED ORDER — HEPARIN SODIUM (PORCINE) 1000 UNIT/ML IJ SOLN
INTRAMUSCULAR | Status: AC
  Filled 2021-10-10: qty 10

## 2021-10-10 MED ORDER — MIDAZOLAM HCL 2 MG/2ML IJ SOLN
INTRAMUSCULAR | Status: AC
  Filled 2021-10-10: qty 2

## 2021-10-10 MED ORDER — SODIUM CHLORIDE 0.9 % IV SOLN: INTRAVENOUS | Status: DC

## 2021-10-10 MED ORDER — SODIUM CHLORIDE 0.9% FLUSH: 3.0000 mL | INTRAVENOUS | Status: DC | PRN

## 2021-10-10 MED ORDER — ACETAMINOPHEN 325 MG PO TABS: 650.0000 mg | ORAL_TABLET | ORAL | Status: DC | PRN

## 2021-10-10 MED ORDER — SODIUM CHLORIDE 0.9% FLUSH
3.0000 mL | Freq: Two times a day (BID) | INTRAVENOUS | Status: DC
  Administered 2021-10-10 – 2021-10-11 (×3): 3 mL via INTRAVENOUS

## 2021-10-10 MED ORDER — MIDAZOLAM HCL 2 MG/2ML IJ SOLN
INTRAMUSCULAR | Status: DC | PRN
  Administered 2021-10-10: 1 mg via INTRAVENOUS

## 2021-10-10 MED ORDER — LIDOCAINE HCL (PF) 1 % IJ SOLN
INTRAMUSCULAR | Status: DC | PRN
  Administered 2021-10-10 (×2): 2 mL

## 2021-10-10 MED ORDER — LIDOCAINE HCL (PF) 1 % IJ SOLN
INTRAMUSCULAR | Status: AC
  Filled 2021-10-10: qty 30

## 2021-10-10 MED ORDER — SODIUM CHLORIDE 0.9% FLUSH: 3.0000 mL | Freq: Two times a day (BID) | INTRAVENOUS | Status: DC

## 2021-10-10 SURGICAL SUPPLY — 12 items
CATH 5FR JL3.5 JR4 ANG PIG MP (CATHETERS) ×1 IMPLANT
CATH BALLN WEDGE 5F 110CM (CATHETERS) ×1 IMPLANT
DEVICE RAD COMP TR BAND LRG (VASCULAR PRODUCTS) ×1 IMPLANT
ELECT DEFIB PAD ADLT CADENCE (PAD) ×1 IMPLANT
GLIDESHEATH SLEND SS 6F .021 (SHEATH) ×1 IMPLANT
GUIDEWIRE .025 260CM (WIRE) ×1 IMPLANT
GUIDEWIRE INQWIRE 1.5J.035X260 (WIRE) IMPLANT
INQWIRE 1.5J .035X260CM (WIRE) ×2
KIT HEART LEFT (KITS) ×2 IMPLANT
PACK CARDIAC CATHETERIZATION (CUSTOM PROCEDURE TRAY) ×2 IMPLANT
SHEATH GLIDE SLENDER 4/5FR (SHEATH) ×1 IMPLANT
TRANSDUCER W/STOPCOCK (MISCELLANEOUS) ×2 IMPLANT

## 2021-10-10 NOTE — Hospital Course (Addendum)
Acute on chronic biventricular systolic heart failure, NYHA Class IIIb ?Nonischemic cardiomyopathy ?Patient presented with dyspnea on exertion, orthopnea, lower extremity edema, hypervolemia on exam, and elevated BNP of over 3000. Nonspecific ST wave changes on EKG.  Troponins relatively flat 40, 30.  He was started on IV Lasix 80 mg twice daily and home jardiance and digoxin were continued. Echocardiogram obtained and showed basal to mid inferoseptal and inferior akinesis,alse tendon, LV EF 20 to 25%, severely decreased function, global hypokinesis, internal cavity size was moderately to severely dilated, mild concentric left ventricular hypertrophy, consistent with Grade II diastolic dysfunction; RV systolic function is mildly reduced, moderately elevated pulmonary artery systolic pressure; severely dilated LA and RA. Wall motion abnormalities were new from last echocardiogram 11/2020. Cardiology was consulted and started the patient on milrinone 0.25 mcg/kg/min 02/25, weaned 02/27, and discontinued 02/28. He was started on spironolactone 02/27. He underwent [facility] 03/01 which showed 1st RPL lesion 80% stenosed, mid LAD lesion 30% stenosed, stable coronaries with 80% stenosis in a relatively small PLV branch, mildly elevated PCWP and LVEDP, normal RA pressure, cardiac index decreased at 1.9. ?  ?Multiple pulmonary infiltrates ?Hemoptysis ?Patient presented with wheezing improved with DuoNeb treatment. Initial chest x-ray revealed stable cardiomegaly and mild left basilar scarring without active lung disease. Patient developed hemoptysis with cough on HD1 prompting CT chest with showed new patchy infiltrates in the right middle lobe and left lower lobe suggesting multifocal pneumonia, small left pleural effusion, coronary artery calcifications are seen, enlarged heart. Procalcitonin was negative and he was afebrile and hemodynamically stable. However, due to concerns for CAP, he was started on ceftriaxone 1 g daily  for 5 days. Home aspirin and Lovenox were held. Pulmonology was consulted and felt that he was coughing up old blood and deferred bronchoscopy due to severity of heart failure. Over the course of admission hemoptysis and cough improved. ?  ?VT/NSVT ?Patient had episodes of NSVT on telemetry throughout admission. He was started on amiodarone gtt 02/25 and recommended to discharge with Life Vest which was delivered to bedside during admission. He was transitioned to amiodarone PO without problem. ? ?Hx hypertension ?Patient was largely hypotensive throughout admission. Entresto and beta blockers were held. Losartan was started 03/01. ? ?CAD ?Patient endorsed some episodes of chest pain that improved with breathing treatment throughout admission. He was continued on home Crestor.  ? ?CKD stage IIIa ?Patient had slight bump in serum creatinine from baseline but this resolved with aggressive diuresis. ?  ?Tobacco use disorder ?Polysubstance use ?UDS was negative at admission. He was counseled on cessation of illicit substances and encouraged to maintain abstinence from tobacco and alcohol. ?  ?Suspected inguinal hernia ?Noted on physical exam at admission.  ?

## 2021-10-10 NOTE — Progress Notes (Addendum)
? ?HD#6 ?SUBJECTIVE:  ?Patient Summary: Edward Mullen is a 66 y.o. with a pertinent PMH of hypertension, diabetes, chronic systolic heart failure, history of NSVT, history of polysubstance abuse who presented with shortness of breath, lower extremity swelling, orthopnea, dyspnea on exertion and admitted for heart failure exacerbation.   ? ?Overnight Events: None ? ?Interim History: Patient states that he did not get a good night of sleep last night as he developed a sharp chest pain that lasted about 10 minutes over the center of his chest.  It resolved after receiving a breathing treatment and has not returned.  His cough is improved and he is having less hemoptysis, mucus is now mixed white and light pink.  He is eager to get his procedure done and go home.  ? ?OBJECTIVE:  ?Vital Signs: ?Vitals:  ? 10/09/21 1627 10/09/21 1948 10/09/21 2151 10/10/21 0438  ?BP: 92/71 101/74  99/87  ?Pulse: 73 77  74  ?Resp: _0 ?Temp: 98 ?F (36.7 ?C) 99 ?F (37.2 ?C)  99 ?F (37.2 ?C)  ?TempSrc: Oral Oral  Oral  ?SpO2: 98% 96% 97% 94%  ?Weight:      ?Height:      ? ?Supplemental O2: Room Air ?SpO2: 94 % ?O2 Flow Rate (L/min): 2 L/min ? ?Filed Weights  ? 10/07/21 0412 10/08/21 0500 10/09/21 0425  ?Weight: 91.2 kg 88.4 kg 88.9 kg  ? ? ? ?Intake/Output Summary (Last 24 hours) at 10/10/2021 0654 ?Last data filed at 10/10/2021 0400 ?Gross per 24 hour  ?Intake 1675.02 ml  ?Output 2550 ml  ?Net -874.98 ml  ? ?Net IO Since Admission: -10,529 mL [10/10/21 0654] ? ?Physical Exam: ?Constitutional: Chronically ill-appearing gentleman resting comfortably in bed.  No acute distress. ?Cardio: Regular rate and rhythm.  No murmurs, rubs, gallops. ?Pulm: Bilateral crackles that continue to improve. ?Abdomen: Soft, nontender, nondistended. ?MSK: Negative for bilateral lower extremity met. ?Skin: Warm and dry. ?Neuro: Alert and oriented x3.  No focal deficit noted. ?Psych: Normal mood and affect. ? ?Patient Lines/Drains/Airways Status   ? ?  Active Line/Drains/Airways   ? ? Name Placement date Placement time Site Days  ? PICC Double Lumen 71/16/57 Right Basilic 44 cm 0 cm 90/38/33  2054  -- 5  ? Incision (Closed) 06/08/19 Back Other (Comment) 06/08/19  1358  -- 855  ? ?  ?  ? ?  ? ? ? ?ASSESSMENT/PLAN:  ?Assessment: ?Principal Problem: ?  Acute exacerbation of CHF (congestive heart failure) ([place]) ?Active Problems: ?  Polysubstance abuse ([place]) ?  Acute on chronic systolic heart failure ([place]) ?  Hemoptysis ?  Type 2 diabetes mellitus with stage 3b chronic kidney disease, without long-term current use of insulin ([place]) ? ? ?Plan: ?Edward Mullen is a 66 y.o. with a pertinent PMH of hypertension, diabetes, chronic systolic heart failure, history of NSVT, history of polysubstance abuse who presented with shortness of breath, lower extremity swelling, orthopnea, dyspnea on exertion and admitted for heart failure exacerbation.  ?  ?Acute on chronic biventricular systolic heart failure, NYHA Class IIIb ?Nonischemic cardiomyopathy ?Net -10.5L and -6.1 kg this admission; minimal change over last 24 hours. AM co-ox 60.6%.  Milrinone was stopped 02/28. ?-Heart failure team following, appreciate their assistance ?            -R/LHC today 03/01 ?-Lasix 40 mg PO daily ?-Spironolactone 25 mg PO daily ?-Discontinue milrinone 10/09/21 ?-Digoxin 0.125 mg daily ?-Jardiance 10 mg daily ?-Start losartan 12.5 mg daily after [facility]  today ?-Klor-Con tab 40 mg twice daily ?-Continue to trend electrolytes, replete as needed ?-PT not recommending further follow-up ?  ?Multiple pulmonary infiltrates  ?Hemoptysis ?Hemoptysis is improved with sputum now mixed white and pink.  His cough is also improved at this time.  He has completed a 5-day course of ceftriaxone, last day was 02/28. ?-Pulmonology consulted, appreciate their assistance ?            -Patient is a poor bronchoscopy candidate due to heart failure requiring inotrope support ?-Hold Lovenox, ASA ?-Trend CBC ?  ?Acute on  chronic kidney injury CKD 3a ?Patient's creatinine is back to baseline after diuresis.  ?-Continue Jardiance 10 mg daily ?-Trend BMP. Replete as necessary. ?-Avoid nephrotoxic medications ?  ?VT/NSVT ?Continues to have episodes of nonsustained V. tach on telemetry.   ?-Continue amiodarone gtt ?-LifeVest at bedside ?  ?CAD ?Patient experienced sharp chest pain lasting 10 minutes overnight that improved with breathing treatment.  He is no longer endorsing this pain. ?-Crestor 5 mg daily ?  ?Suspected COPD ?Wheezing improved over course of admission thus far. ?-Continue DuoNebs as needed ?-Will need outpatient PFTs ? ?Best Practice: ?Diet: Heart Healthy ?IVF: None ?VTE: SCDs/TED hose ?Code: Full ?PT/OT recs: None ?Dispo: Anticipated discharge home pending further management of his acute heart failure exacerbation. ? ?Signature: ?Farrel Gordon, D.O.  ?Internal Medicine Resident, PGY-1 ?[facility]  ?Pager: [phone] ?6:54 AM, 10/10/2021  ? ?Please contact the on call pager after 5 pm and on weekends at [phone].  ?

## 2021-10-10 NOTE — Progress Notes (Signed)
Mobility Specialist Progress Note: ? ? 10/10/21 1035  ?Mobility  ?Activity Ambulated with assistance in hallway  ?Level of Assistance Contact guard assist, steadying assist  ?Assistive Device Cane  ?Distance Ambulated (ft) 330 ft  ?Activity Response Tolerated fair  ?$Mobility charge 1 Mobility  ? ?Pt agreeable to mobility session. Required CGA throughout session d/t slightly unsteady gait with minor LOBs. Pt states his hips feel tight this morning, which has been happening PTA. Pt back in bed with all needs met.  ? ?Anna Kincaid ?Acute Rehab ?Phone: [phone] ?Office Phone: [phone] ? ?

## 2021-10-10 NOTE — Progress Notes (Signed)
Pt's SBP was low 100's. Notified cardiologist before administering Losartan and Torsemide. A Night nurse will check BP manually and give update to on call cardiologist. ?

## 2021-10-10 NOTE — Progress Notes (Signed)
Patient ID: Edward Mullen, male   DOB: 11/22/1955, 66 y.o.   MRN: [id number] ? ? ? ?Advanced Heart Failure Rounding Note ? ? ?Subjective:   ? ?Admitted w/ PNA and a/c CHF w/ low output. PICC placed 2/24. Co-ox 43%. Started on milrinone.  ?2/27: Milrinone decreased to 0.125. IV lasix stopped>>transitioned to PO. ?2/28: Milrinone stopped.  ? ?Co-ox 61% today off milrinone.  SBP 90s-100s.  CVP 6. Creatinine stable at 1.4.  ? ?Hemoptysis improving, had TXA nebs yesterday.  Pulmonary following.  He has completed abx for PNA.  Holding Lovenox and ASA.  H/H stable.  ? ?He is on amiodarone gtt for NSVT. ? ?LifeVest in room. ? ?Objective:  ? ?Vital Signs:   ?Temp:  [98 ?F (36.7 ?C)-99 ?F (37.2 ?C)] 99 ?F (37.2 ?C) (03/01 7846) ?Pulse Rate:  [72-77] 74 (03/01 0438) ?Resp:  [18] 18 (03/01 0438) ?BP: (91-101)/(64-87) 99/87 (03/01 9629) ?SpO2:  [92 %-100 %] 100 % (03/01 0832) ?Last BM Date : 10/09/21 ? ?Weight change: ?Filed Weights  ? 10/07/21 0412 10/08/21 0500 10/09/21 0425  ?Weight: 91.2 kg 88.4 kg 88.9 kg  ? ? ?Intake/Output:  ? ?Intake/Output Summary (Last 24 hours) at 10/10/2021 0959 ?Last data filed at 10/10/2021 0400 ?Gross per 24 hour  ?Intake 1195.02 ml  ?Output 1650 ml  ?Net -454.98 ml  ?  ? ?PHYSICAL EXAM: ?CVP 6 ?General: NAD ?Neck: JVP 7-8 cm, no thyromegaly or thyroid nodule.  ?Lungs: Mild crackles at bases.  ?CV: Nondisplaced PMI.  Heart regular S1/S2, no S3/S4, no murmur.  No peripheral edema.   ?Abdomen: Soft, nontender, no hepatosplenomegaly, no distention.  ?Skin: Intact without lesions or rashes.  ?Neurologic: Alert and oriented x 3.  ?Psych: Normal affect. ?Extremities: No clubbing or cyanosis.  ?HEENT: Normal.  ? ?Telemetry: NSR 70s-80s, personally reviewed ? ?Labs: ?Basic Metabolic Panel: ?Recent Labs  ?Lab 10/05/21 ?5284 10/05/21 ?1511 10/06/21 ?0238 10/07/21 ?0500 10/08/21 ?1324 10/09/21 ?0600 10/10/21 ?0415  ?NA 140   < > 139 136 137 136 137  ?K 4.0   < > 3.9 3.2* 3.4* 4.4 4.0  ?CL 109   < > 104 97* 98  101 105  ?CO2 20*   < > 23 27 31 28 25   ?GLUCOSE 115*   < > 102* 238* 192* 178* 147*  ?BUN 28*   < > 34* 24* 17 14 15   ?CREATININE 1.93*   < > 1.97* 1.60* 1.60* 1.43* 1.41*  ?CALCIUM 8.5*   < > 8.5* 7.9* 8.1* 8.1* 8.1*  ?MG 1.7  --  1.9 1.9 2.0 2.0  --   ? < > = values in this interval not displayed.  ? ? ?Liver Function Tests: ?Recent Labs  ?Lab 10/05/21 ?4010  ?AST 36  ?ALT 29  ?ALKPHOS 63  ?BILITOT 0.9  ?PROT 5.5*  ?ALBUMIN 2.8*  ? ?No results for input(s): LIPASE, AMYLASE in the last 168 hours. ?No results for input(s): AMMONIA in the last 168 hours. ? ?CBC: ?Recent Labs  ?Lab 10/05/21 ?2725 10/06/21 ?1137 10/06/21 ?1843 10/07/21 ?0500 10/08/21 ?3664 10/09/21 ?1229 10/10/21 ?0415  ?WBC 5.2 12.1* 11.0* 10.2 8.4 8.5 8.5  ?NEUTROABS 4.5 9.2*  --  7.5  --   --   --   ?HGB 12.9* 12.7* 12.3* 12.6* 12.9* 13.9 13.2  ?HCT 41.0 39.0 38.6* 38.3* 39.0 41.9 41.7  ?MCV 89.7 88.0 88.7 87.6 87.6 87.8 88.5  ?PLT 144* 158 155 149* 147* 166 169  ? ? ?Cardiac Enzymes: ?No results for input(s):  CKTOTAL, CKMB, CKMBINDEX, TROPONINI in the last 168 hours. ? ?BNP: ?BNP (last 3 results) ?Recent Labs  ?  11/28/20 ?1042 10/04/21 ?1153  ?BNP 2,565.3* 3,768.9*  ? ? ?ProBNP (last 3 results) ?No results for input(s): PROBNP in the last 8760 hours. ? ? ? ?Other results: ? ?Imaging: ?No results found. ? ? ?Medications:   ? ? ?Scheduled Medications: ? Chlorhexidine Gluconate Cloth  6 each Topical Daily  ? digoxin  0.125 mg Oral Daily  ? empagliflozin  10 mg Oral Daily  ? furosemide  40 mg Oral Daily  ? ipratropium-albuterol  3 mL Nebulization BID  ? losartan  12.5 mg Oral Daily  ? potassium chloride  40 mEq Oral Daily  ? rosuvastatin  5 mg Oral Daily  ? sodium chloride flush  10-40 mL Intracatheter Q12H  ? sodium chloride flush  3 mL Intravenous Q12H  ? spironolactone  25 mg Oral Daily  ? ? ?Infusions: ? sodium chloride    ? sodium chloride 10 mL/hr at 10/10/21 0653  ? amiodarone 30 mg/hr (10/10/21 0422)  ? ? ?PRN Medications: ?sodium chloride,  acetaminophen **OR** acetaminophen, albuterol, sodium chloride flush, sodium chloride flush ? ? ?Assessment:  ? ?1. Acute on Chronic Biventricular Systolic Heart Failure:  EF 15-20% with moderate to severe MR on echo 1/18.  Nonischemic cardiomyopathy. HTN vs cocaine use vs viral vs ETOH. HIV negative, SPEP with no M-spike.  Patient has been noted in the past to have CAD but not enough to explain cardiomyopathy (last cath in 4/22 with 80% PLV stenosis).  Echo in 8/18 showed EF up to 50%. However, he stopped his meds and started cocaine again, echo in 4/22 with EF back down to 20-25% => suspect cocaine abuse may be the primary etiology of cardiomyopathy. Poor compliance w/ meds and f/u in the [facility].  Now admitted w/ NYHA Class IIIb symptoms and marked fluid overload. UDS negative. Echo this admission with EF 20-25%, RV mildly reduced. Initially w/ worsening renal function w/ diuresis, concerning for low output HF. PICC placed 2/24, Initial Co-ox 43%, Lactic acid 2.1. Started on milrinone 0.25. Co-ox improved + improved diuresis.  Now weaned off milrinone, co-ox 61% today with creatinine 1.4.  CVP 6.  ?- Continue Lasix 40 mg po daily.  ?- Continue digoxin 0.125 ?- Continue Jardiance 10 mg daily ?- Continue spironolactone 25 mg daily ?- BP too soft for Entresto restart. Start losartan 12.5 mg daily after cath.  ?- no ? blocker w/ low output  ?- w/ frequent runs of VT/ NSVT, recommend LifeVest at discharge.  ?- Not a candidate for advanced therapies including home inotropes given h/o polysubstance use and poor compliance  ?- Recommend paramedicine at d/c  ?- If he can stay off cocaine, should be CRT-D candidate with wide LBBB.  ?- R/LHC today given increased ventricular arrhythmias, r/o worsening of CAD. Discussed risks/benefits with patient and he agrees to procedure.  ?2. VT/NSVT: Multiple runs this admit, longest was 28 beats. HS trop trend not c/w ACS.  ?- D/t recurrent NSVT will arrange for LHC/RHC today.  ?-  LifeVest at discharge  ?- Now that off milrinone, will transition amiodarone to po.  ?3. AKI on CKD III: Baseline SCr 1.6.  Cardiorenal/ low output, improved w/ milrinone + diuresis.  Creatinine down to 1.4 today.  ?4. Hemoptysis: CT w/contrast not suggestive of PE, no evidence of nodules/masses.  May be due to PNA vs aspiration pneumonitis. Hgb remains stable, he has been treated for possible PNA  with ceftriaxone. ?- pulmonology following, may need bronchoscopy but likely after reassessment as outpatient.  ?5. Multifocal PNA: Chest CT w/ patchy infiltrates in the right middle lobe and left lower lobe, concerning for possible PNA but afebrile w/ normal WBC. PCT <0.10.  ?- on empiric abx w/ ceftriaxone  ?- pulmonology following  ?6. CAD: 80% stenosis PLV on cath in 4/22.  No chest pain. Hs trop 39>>43, c/w demand ischemia ?- R/LHC as above d/t recurrent NSVT ?- continue statin  ?- holding ASA w/ hemoptysis ?- off ? blocker w/ low output  ?7. Cocaine abuse: Last use about a month prior to admission.  ? ?Loralie Champagne ?10/10/2021 ?9:59 AM ? ?

## 2021-10-10 NOTE — Progress Notes (Signed)
? ?  NAME:  Edward Mullen, MRN:  [id number], DOB:  1955/08/21, LOS: 6 ?ADMISSION DATE:  10/04/2021, CONSULTATION DATE:  10/07/2021 ?REFERRING MD:  Heber Westdale, IMTS, CHIEF COMPLAINT:  Hemoptysis  ? ?History of Present Illness:  ?66 yo male with hx of tobacco abuse, marijuana abuse, and cocaine abuse developed cough with clear sputum, shortness of breath and leg swelling.  Admitted for CHF exacerbation.  Started coughing up blood with last episode earlier this afternoon.  Denies epistaxis or hematemesis.  Not aware of having difficulty swallowing.  Reports last time he smoked or used substances was few weeks ago when he started having more trouble with his breathing.  Not having fever or weight loss, but has intermittent sweats.  ? ?Pertinent  Medical History  ?HTN, DM, chronic systolic HF, polysubstance abuse, NSVT ? ?Significant Hospital Events: ?Including procedures, antibiotic start and stop dates in addition to other pertinent events   ?2/23 admitted for HF. IV lasix given  ?2/24 new hemoptysis. CT chest new patchy infiltrates in the right middle lobe and left lower lobe suggesting multifocal pneumonia. Small left pleural effusion. LMWH stopped. Started on rocephin. ECHO 20-25% W/ GLOBAL HYPOKINESIS grade 2 diastolic dysfxn. UDS neg  ?2/25 pulm asked to see for cxr progression and cont hemoptysis  ?2/28 TXA neb ? ?Interim History / Subjective:  ?Cough improving. Thinks degree of hemoptysis improving. Received TXA neb 2/28. ? ?Objective   ?Blood pressure 99/87, pulse 74, temperature 99 ?F (37.2 ?C), temperature source Oral, resp. rate 18, height 6' (1.829 m), weight 88.9 kg, SpO2 100 %. ?CVP:  [5 mmHg-9 mmHg] 6 mmHg  ?   ? ?Intake/Output Summary (Last 24 hours) at 10/10/2021 0850 ?Last data filed at 10/10/2021 0400 ?Gross per 24 hour  ?Intake 1675.02 ml  ?Output 2550 ml  ?Net -874.98 ml  ? ? ?Filed Weights  ? 10/07/21 0412 10/08/21 0500 10/09/21 0425  ?Weight: 91.2 kg 88.4 kg 88.9 kg  ? ? ?Examination: ? ?General -  alert ?Eyes - pupils reactive ?ENT - no sinus tenderness, no stridor ?Cardiac - regular rate/rhythm, no murmur ?Chest - clear ?Abdomen - soft, non tender, + bowel sounds ?Extremities - no cyanosis, clubbing, or edema ?Skin - no rashes ?Neuro - normal strength, moves extremities, follows commands ?Psych - normal mood and behavior ? ? ?Assessment & Plan:  ? ?Hemoptysis with progressive Rt sided infiltrate. ?- concern he could have CAP or aspiration pneumonitis (although initial PCT is negative), possible asymmetric edema ?- RML abnormality on CT appears possibly present on CXR dating back April 2022 ?- s/p CAP treatment ?- s/p nebulized TXA 2/28 ?- given heart failure with recent inotrope support he is poor candidate for bronchoscopy currently but can re-evaluate in coming days ?- hold lovenox and ASA for now ?  ?Small Lt pleural effusion. ?- likely in setting of acute on chronic systolic CHF ?- f/u CXR shows improvement ? ? ?Signature:  ?Lanier Clam, MD ?[place] ?See Amion for contact info ?10/10/2021, 8:50 AM ? ? ? ? ? ?

## 2021-10-10 NOTE — Progress Notes (Signed)
Physical Therapy Treatment ?Patient Details ?Name: Edward Mullen ?MRN: [id number] ?DOB: May 16, 1956 ?Today's Date: 10/10/2021 ? ? ?History of Present Illness 66 y.o. male presents to [facility] on 10/04/2021 with SOB, orthopnea, LE swelling. Pt admitted for CHF exacerbation. PMH includes HTN, DMII, CHF, NSVT, polysubstance abuse. ? ?  ?PT Comments  ? ? The pt was eager to continue with mobility and endurance training this morning. He was able to demo improved speed and stability with gait at this time, but did require increased standing rest breaks. He was further challenged by repeated sit-stand transfers from EOB for 1 min, completing 7 in first 30 seconds and only 3 in last 30 seconds indicating poor endurance and strength deficits. The pt will continue to benefit from skilled PT to improve strength and endurance.  ? ?Gait Speed: 0.43m/s using SPC (Gait speed < 1.42m/s indicates increased risk of falls) ? ?60 Second Sit-Stand: The patient completed 10 sit-stands in a 60 second period, 7 in first 30 seconds and 3 in final 30 seconds. This indicates poor activity tolerance, muscular endurance, and increased risk of falls. (< 14 for individuals aged 61 - 42 indicates below average activity tolerance and increased risk of falls) ? ?   ?Recommendations for follow up therapy are one component of a multi-disciplinary discharge planning process, led by the attending physician.  Recommendations may be updated based on patient status, additional functional criteria and insurance authorization. ? ?Follow Up Recommendations ? No PT follow up ?  ?  ?Assistance Recommended at Discharge PRN  ?Patient can return home with the following A little help with bathing/dressing/bathroom;Assistance with cooking/housework ?  ?Equipment Recommendations ? None recommended by PT  ?  ?Recommendations for Other Services   ? ? ?  ?Precautions / Restrictions Precautions ?Precautions: Fall ?Precaution Comments: monitor SpO2 ?Restrictions ?Weight  Bearing Restrictions: No  ?  ? ?Mobility ? Bed Mobility ?Overal bed mobility: Modified Independent ?  ?  ?  ?  ?  ?  ?General bed mobility comments: HOB elevated ?  ? ?Transfers ?Overall transfer level: Modified independent ?Equipment used: Straight cane ?  ?  ?  ?  ?  ?  ?  ?General transfer comment: pt able to complete multiple times without assist or instability ?  ? ?Ambulation/Gait ?Ambulation/Gait assistance: Supervision ?Gait Distance (Feet): 50 Feet (+ 28ft) ?Assistive device: Straight cane ?Gait Pattern/deviations: Step-through pattern ?Gait velocity: 0.63 m/s ?Gait velocity interpretation: 1.31 - 2.62 ft/sec, indicative of limited community ambulator ?  ?General Gait Details: pt needing standing rest break after 50 ft, able to complete x2 speed intervals for ~8-10 ft then return to slowed speed. ? ? ? ?  ?Balance Overall balance assessment: Needs assistance ?Sitting-balance support: No upper extremity supported, Feet supported ?Sitting balance-Leahy Scale: Good ?  ?  ?Standing balance support: During functional activity, Single extremity supported ?Standing balance-Leahy Scale: Poor ?Standing balance comment: reaching for BUE at times with static standing, able to ambulate with single UE support for gait ?  ?  ?  ?  ?  ?  ?  ?  ?  ?  ?  ?  ? ?  ?Cognition Arousal/Alertness: Awake/alert ?Behavior During Therapy: [facility] for tasks assessed/performed ?Overall Cognitive Status: Within Functional Limits for tasks assessed ?  ?  ?  ?  ?  ?  ?  ?  ?  ?  ?  ?  ?  ?  ?  ?  ?General Comments: pt following all cues, able to self-monitor  fatigue and exertion ?  ?  ? ?  ?Exercises Other Exercises ?Other Exercises: 1 min sit-stand from EOB - completed 7 in first 30 sec, then 3 in last 30 sec ? ?  ?General Comments   ?  ?  ? ?Pertinent Vitals/Pain Pain Assessment ?Pain Assessment: No/denies pain  ? ? ? ?PT Goals (current goals can now be found in the care plan section) Acute Rehab PT Goals ?Patient Stated Goal: to return  home and improve activity tolerance ?PT Goal Formulation: With patient ?Time For Goal Achievement: 10/20/21 ?Potential to Achieve Goals: Good ?Progress towards PT goals: Progressing toward goals ? ?  ?Frequency ? ? ? Min 3X/week ? ? ? ?  ?PT Plan Current plan remains appropriate  ? ? ?   ?AM-PAC PT "6 Clicks" Mobility   ?Outcome Measure ? Help needed turning from your back to your side while in a flat bed without using bedrails?: None ?Help needed moving from lying on your back to sitting on the side of a flat bed without using bedrails?: None ?Help needed moving to and from a bed to a chair (including a wheelchair)?: None ?Help needed standing up from a chair using your arms (e.g., wheelchair or bedside chair)?: None ?Help needed to walk in hospital room?: A Little ?Help needed climbing 3-5 steps with a railing? : A Little ?6 Click Score: 22 ? ?  ?End of Session Equipment Utilized During Treatment: Gait belt ?Activity Tolerance: Patient tolerated treatment well ?Patient left: in bed;with call bell/phone within reach ?Nurse Communication: Mobility status ?PT Visit Diagnosis: Other abnormalities of gait and mobility (R26.89) ?  ? ? ?Time: 1610-9604 ?PT Time Calculation (min) (ACUTE ONLY): 20 min ? ?Charges:  $Therapeutic Exercise: 8-22 mins          ?          ? ?West Carbo, PT, DPT  ? ?Acute Rehabilitation Department ?Pager #: [phone] ? ? ?Sandra Cockayne ?10/10/2021, 9:43 AM ? ?

## 2021-10-10 NOTE — H&P (View-Only) (Signed)
Patient ID: Edward Mullen, male   DOB: May 08, 1956, 66 y.o.   MRN: [id number] ? ? ? ?Advanced Heart Failure Rounding Note ? ? ?Subjective:   ? ?Admitted w/ PNA and a/c CHF w/ low output. PICC placed 2/24. Co-ox 43%. Started on milrinone.  ?2/27: Milrinone decreased to 0.125. IV lasix stopped>>transitioned to PO. ?2/28: Milrinone stopped.  ? ?Co-ox 61% today off milrinone.  SBP 90s-100s.  CVP 6. Creatinine stable at 1.4.  ? ?Hemoptysis improving, had TXA nebs yesterday.  Pulmonary following.  He has completed abx for PNA.  Holding Lovenox and ASA.  H/H stable.  ? ?He is on amiodarone gtt for NSVT. ? ?LifeVest in room. ? ?Objective:  ? ?Vital Signs:   ?Temp:  [98 ?F (36.7 ?C)-99 ?F (37.2 ?C)] 99 ?F (37.2 ?C) (03/01 5053) ?Pulse Rate:  [72-77] 74 (03/01 0438) ?Resp:  [18] 18 (03/01 0438) ?BP: (91-101)/(64-87) 99/87 (03/01 9767) ?SpO2:  [92 %-100 %] 100 % (03/01 0832) ?Last BM Date : 10/09/21 ? ?Weight change: ?Filed Weights  ? 10/07/21 0412 10/08/21 0500 10/09/21 0425  ?Weight: 91.2 kg 88.4 kg 88.9 kg  ? ? ?Intake/Output:  ? ?Intake/Output Summary (Last 24 hours) at 10/10/2021 0959 ?Last data filed at 10/10/2021 0400 ?Gross per 24 hour  ?Intake 1195.02 ml  ?Output 1650 ml  ?Net -454.98 ml  ?  ? ?PHYSICAL EXAM: ?CVP 6 ?General: NAD ?Neck: JVP 7-8 cm, no thyromegaly or thyroid nodule.  ?Lungs: Mild crackles at bases.  ?CV: Nondisplaced PMI.  Heart regular S1/S2, no S3/S4, no murmur.  No peripheral edema.   ?Abdomen: Soft, nontender, no hepatosplenomegaly, no distention.  ?Skin: Intact without lesions or rashes.  ?Neurologic: Alert and oriented x 3.  ?Psych: Normal affect. ?Extremities: No clubbing or cyanosis.  ?HEENT: Normal.  ? ?Telemetry: NSR 70s-80s, personally reviewed ? ?Labs: ?Basic Metabolic Panel: ?Recent Labs  ?Lab 10/05/21 ?3419 10/05/21 ?1511 10/06/21 ?0238 10/07/21 ?0500 10/08/21 ?3790 10/09/21 ?0600 10/10/21 ?0415  ?NA 140   < > 139 136 137 136 137  ?K 4.0   < > 3.9 3.2* 3.4* 4.4 4.0  ?CL 109   < > 104 97* 98  101 105  ?CO2 20*   < > 23 27 31 28 25   ?GLUCOSE 115*   < > 102* 238* 192* 178* 147*  ?BUN 28*   < > 34* 24* 17 14 15   ?CREATININE 1.93*   < > 1.97* 1.60* 1.60* 1.43* 1.41*  ?CALCIUM 8.5*   < > 8.5* 7.9* 8.1* 8.1* 8.1*  ?MG 1.7  --  1.9 1.9 2.0 2.0  --   ? < > = values in this interval not displayed.  ? ? ?Liver Function Tests: ?Recent Labs  ?Lab 10/05/21 ?2409  ?AST 36  ?ALT 29  ?ALKPHOS 63  ?BILITOT 0.9  ?PROT 5.5*  ?ALBUMIN 2.8*  ? ?No results for input(s): LIPASE, AMYLASE in the last 168 hours. ?No results for input(s): AMMONIA in the last 168 hours. ? ?CBC: ?Recent Labs  ?Lab 10/05/21 ?7353 10/06/21 ?1137 10/06/21 ?1843 10/07/21 ?0500 10/08/21 ?2992 10/09/21 ?1229 10/10/21 ?0415  ?WBC 5.2 12.1* 11.0* 10.2 8.4 8.5 8.5  ?NEUTROABS 4.5 9.2*  --  7.5  --   --   --   ?HGB 12.9* 12.7* 12.3* 12.6* 12.9* 13.9 13.2  ?HCT 41.0 39.0 38.6* 38.3* 39.0 41.9 41.7  ?MCV 89.7 88.0 88.7 87.6 87.6 87.8 88.5  ?PLT 144* 158 155 149* 147* 166 169  ? ? ?Cardiac Enzymes: ?No results for input(s):  CKTOTAL, CKMB, CKMBINDEX, TROPONINI in the last 168 hours. ? ?BNP: ?BNP (last 3 results) ?Recent Labs  ?  11/28/20 ?1042 10/04/21 ?1153  ?BNP 2,565.3* 3,768.9*  ? ? ?ProBNP (last 3 results) ?No results for input(s): PROBNP in the last 8760 hours. ? ? ? ?Other results: ? ?Imaging: ?No results found. ? ? ?Medications:   ? ? ?Scheduled Medications: ? Chlorhexidine Gluconate Cloth  6 each Topical Daily  ? digoxin  0.125 mg Oral Daily  ? empagliflozin  10 mg Oral Daily  ? furosemide  40 mg Oral Daily  ? ipratropium-albuterol  3 mL Nebulization BID  ? losartan  12.5 mg Oral Daily  ? potassium chloride  40 mEq Oral Daily  ? rosuvastatin  5 mg Oral Daily  ? sodium chloride flush  10-40 mL Intracatheter Q12H  ? sodium chloride flush  3 mL Intravenous Q12H  ? spironolactone  25 mg Oral Daily  ? ? ?Infusions: ? sodium chloride    ? sodium chloride 10 mL/hr at 10/10/21 0653  ? amiodarone 30 mg/hr (10/10/21 0422)  ? ? ?PRN Medications: ?sodium chloride,  acetaminophen **OR** acetaminophen, albuterol, sodium chloride flush, sodium chloride flush ? ? ?Assessment:  ? ?1. Acute on Chronic Biventricular Systolic Heart Failure:  EF 15-20% with moderate to severe MR on echo 1/18.  Nonischemic cardiomyopathy. HTN vs cocaine use vs viral vs ETOH. HIV negative, SPEP with no M-spike.  Patient has been noted in the past to have CAD but not enough to explain cardiomyopathy (last cath in 4/22 with 80% PLV stenosis).  Echo in 8/18 showed EF up to 50%. However, he stopped his meds and started cocaine again, echo in 4/22 with EF back down to 20-25% => suspect cocaine abuse may be the primary etiology of cardiomyopathy. Poor compliance w/ meds and f/u in the [facility].  Now admitted w/ NYHA Class IIIb symptoms and marked fluid overload. UDS negative. Echo this admission with EF 20-25%, RV mildly reduced. Initially w/ worsening renal function w/ diuresis, concerning for low output HF. PICC placed 2/24, Initial Co-ox 43%, Lactic acid 2.1. Started on milrinone 0.25. Co-ox improved + improved diuresis.  Now weaned off milrinone, co-ox 61% today with creatinine 1.4.  CVP 6.  ?- Continue Lasix 40 mg po daily.  ?- Continue digoxin 0.125 ?- Continue Jardiance 10 mg daily ?- Continue spironolactone 25 mg daily ?- BP too soft for Entresto restart. Start losartan 12.5 mg daily after cath.  ?- no ? blocker w/ low output  ?- w/ frequent runs of VT/ NSVT, recommend LifeVest at discharge.  ?- Not a candidate for advanced therapies including home inotropes given h/o polysubstance use and poor compliance  ?- Recommend paramedicine at d/c  ?- If he can stay off cocaine, should be CRT-D candidate with wide LBBB.  ?- R/LHC today given increased ventricular arrhythmias, r/o worsening of CAD. Discussed risks/benefits with patient and he agrees to procedure.  ?2. VT/NSVT: Multiple runs this admit, longest was 28 beats. HS trop trend not c/w ACS.  ?- D/t recurrent NSVT will arrange for LHC/RHC today.  ?-  LifeVest at discharge  ?- Now that off milrinone, will transition amiodarone to po.  ?3. AKI on CKD III: Baseline SCr 1.6.  Cardiorenal/ low output, improved w/ milrinone + diuresis.  Creatinine down to 1.4 today.  ?4. Hemoptysis: CT w/contrast not suggestive of PE, no evidence of nodules/masses.  May be due to PNA vs aspiration pneumonitis. Hgb remains stable, he has been treated for possible PNA  with ceftriaxone. ?- pulmonology following, may need bronchoscopy but likely after reassessment as outpatient.  ?5. Multifocal PNA: Chest CT w/ patchy infiltrates in the right middle lobe and left lower lobe, concerning for possible PNA but afebrile w/ normal WBC. PCT <0.10.  ?- on empiric abx w/ ceftriaxone  ?- pulmonology following  ?6. CAD: 80% stenosis PLV on cath in 4/22.  No chest pain. Hs trop 39>>43, c/w demand ischemia ?- R/LHC as above d/t recurrent NSVT ?- continue statin  ?- holding ASA w/ hemoptysis ?- off ? blocker w/ low output  ?7. Cocaine abuse: Last use about a month prior to admission.  ? ?Loralie Champagne ?10/10/2021 ?9:59 AM ? ?

## 2021-10-10 NOTE — Interval H&P Note (Signed)
History and Physical Interval Note: ? ?10/10/2021 ?10:55 AM ? ?Edward Mullen  has presented today for surgery, with the diagnosis of heart failure.  The various methods of treatment have been discussed with the patient and family. After consideration of risks, benefits and other options for treatment, the patient has consented to  Procedure(s): ?RIGHT/LEFT HEART CATH AND CORONARY ANGIOGRAPHY (N/A) as a surgical intervention.  The patient's history has been reviewed, patient examined, no change in status, stable for surgery.  I have reviewed the patient's chart and labs.  Questions were answered to the patient's satisfaction.   ? ? ?Tyriek Hofman Aundra Dubin ? ? ?

## 2021-10-11 ENCOUNTER — Telehealth: Payer: Self-pay | Admitting: Pulmonary Disease

## 2021-10-11 ENCOUNTER — Other Ambulatory Visit (HOSPITAL_COMMUNITY): Payer: Self-pay

## 2021-10-11 DIAGNOSIS — I5023 Acute on chronic systolic (congestive) heart failure: Secondary | ICD-10-CM | POA: Diagnosis not present

## 2021-10-11 LAB — COOXEMETRY PANEL
Carboxyhemoglobin: 1.4 % (ref 0.5–1.5)
Methemoglobin: 0.7 % (ref 0.0–1.5)
O2 Saturation: 60.2 %
Total hemoglobin: 13.8 g/dL (ref 12.0–16.0)

## 2021-10-11 LAB — BASIC METABOLIC PANEL
Anion gap: 10 (ref 5–15)
BUN: 19 mg/dL (ref 8–23)
CO2: 24 mmol/L (ref 22–32)
Calcium: 8.5 mg/dL — ABNORMAL LOW (ref 8.9–10.3)
Chloride: 104 mmol/L (ref 98–111)
Creatinine, Ser: 1.57 mg/dL — ABNORMAL HIGH (ref 0.61–1.24)
GFR, Estimated: 48 mL/min — ABNORMAL LOW (ref >60)
Glucose, Bld: 98 mg/dL (ref 70–99)
Potassium: 4.9 mmol/L (ref 3.5–5.1)
Sodium: 138 mmol/L (ref 135–145)

## 2021-10-11 LAB — CBC
HCT: 41.8 % (ref 39.0–52.0)
Hemoglobin: 13.2 g/dL (ref 13.0–17.0)
MCH: 27.7 pg (ref 26.0–34.0)
MCHC: 31.6 g/dL (ref 30.0–36.0)
MCV: 87.8 fL (ref 80.0–100.0)
Platelets: 164 10*3/uL (ref 150–400)
RBC: 4.76 MIL/uL (ref 4.22–5.81)
RDW: 15.9 % — ABNORMAL HIGH (ref 11.5–15.5)
WBC: 9 10*3/uL (ref 4.0–10.5)
nRBC: 0 % (ref 0.0–0.2)

## 2021-10-11 MED ORDER — AMIODARONE HCL 200 MG PO TABS
200.0000 mg | ORAL_TABLET | Freq: Every day | ORAL | 3 refills | Status: DC
  Filled 2021-10-11: qty 30, 30d supply, fill #0

## 2021-10-11 MED ORDER — AMIODARONE HCL 200 MG PO TABS
200.0000 mg | ORAL_TABLET | Freq: Every day | ORAL | 3 refills | Status: DC
  Filled 2021-10-11: qty 90, 90d supply, fill #0

## 2021-10-11 MED ORDER — TORSEMIDE 20 MG PO TABS
40.0000 mg | ORAL_TABLET | Freq: Every day | ORAL | 2 refills | Status: DC
  Filled 2021-10-11: qty 60, 30d supply, fill #0

## 2021-10-11 MED ORDER — LOSARTAN POTASSIUM 25 MG PO TABS
12.5000 mg | ORAL_TABLET | Freq: Every day | ORAL | 1 refills | Status: DC
  Filled 2021-10-11: qty 30, 60d supply, fill #0

## 2021-10-11 MED ORDER — ASPIRIN EC 81 MG PO TBEC: 81.0000 mg | DELAYED_RELEASE_TABLET | Freq: Every day | ORAL | Status: DC

## 2021-10-11 MED ORDER — TRANEXAMIC ACID FOR INHALATION
500.0000 mg | Freq: Once | RESPIRATORY_TRACT | Status: AC
Start: 2021-10-11 — End: 2021-10-11
  Administered 2021-10-11: 500 mg via RESPIRATORY_TRACT
  Filled 2021-10-11: qty 10

## 2021-10-11 MED ORDER — AMIODARONE HCL 200 MG PO TABS
200.0000 mg | ORAL_TABLET | Freq: Two times a day (BID) | ORAL | 0 refills | Status: DC
  Filled 2021-10-11: qty 44, 30d supply, fill #0

## 2021-10-11 MED ORDER — SPIRONOLACTONE 25 MG PO TABS
25.0000 mg | ORAL_TABLET | Freq: Every day | ORAL | 2 refills | Status: DC
  Filled 2021-10-11: qty 30, 30d supply, fill #0

## 2021-10-11 MED ORDER — AMIODARONE HCL 200 MG PO TABS
200.0000 mg | ORAL_TABLET | Freq: Two times a day (BID) | ORAL | Status: DC
  Administered 2021-10-11: 200 mg via ORAL
  Filled 2021-10-11: qty 1

## 2021-10-11 NOTE — Progress Notes (Signed)
CARDIAC REHAB PHASE I  ? ?PRE:  Rate/Rhythm: 71 SR ? ?  BP: sitting 103/82 ? ?  SaO2: 81?-97 RA ? ?MODE:  Ambulation: 180 ft  ? ?POST:  Rate/Rhythm: 88 SR ? ?  BP: sitting 95/67  ? ?  SaO2: 90-95 RA ? ?On arrival pt having an episode of SOB, SaO2 on monitor 81 RA. By the time I applied my pulse ox and got reading, SaO2 moving to to 91 then 97 RA eventually. Pt felt better.  Had some SAO2 fluctuations to 87 RA but not SOB. Ambulated with his cane, SAO2 maintained 90-95 RA.  ? ?Discussed HF booklet, low sodium, decreased fluid, signs of fluid. Pt somewhat receptive. Discussed maintaining tobacco free, pt sts he is committed. Quit smoking cigarettes one month. Encouraged cessation of all substances. Gave smoking cessation sheet. ?2025-4270 ? ?Ginger Blue, ACSM ?10/11/2021 ?12:04 PM ? ? ? ? ?

## 2021-10-11 NOTE — Telephone Encounter (Signed)
Patient is scheduled for HFU on 3/29/20253 at 2pm with Dr. Silas Flood. Nothing further needed. ?

## 2021-10-11 NOTE — Progress Notes (Signed)
?Heart and Vascular Care Navigation ? ?10/11/2021 ? ?Edward Mullen ?Feb 27, 1956 ?[id number] ? ?Reason for Referral: Outpatient HF CSW consulted to enroll pt in [employer] Paramedicine program ?  ?Engaged with patient face to face for initial visit for Heart and Vascular Care Coordination. ?                                                                                                  ?Assessment:    CSW met with pt at bedside to discuss enrollment in Edward Mullen.  Was on the program twice before most recently Edward Mullen July 2022 as he was stable and managing own medications.  Pt agreeable to reenrollment ? ?Paramedicine Initial Assessment: ? ?Housing:  ?Do you live with anyone? Dtr and 2 grandsons ? ?Are you currently worried about losing your housing? No- has been living with dtr for 3 months and dtr now trying to get pt added to lease agreement ? ?Social:  ?What is your current marital status? separated ? ?Do you have any children? Just his dtr ? ?Do you have family or friends who live locally? No one other than above. ? ?Income:  ?What is your current source of income? Disability income- $950/month ? ? ?Insurance:  ?Are you currently insured? Medicare and medicaid ? ?Do you have prescription coverage? yes ? ?Transportation:  ?Do you have transportation to your medical appointments? Dtr PCP office transports him to their appts- also states he is interested in signing up for [place] mailing out application ? ? Daily Health Needs: ?Do you have a working scale at home? yes ? ?How do you manage your medications at home? Takes them out of the bottle- was taking from pill packs but said they stopped for whatever reason ? ?Do you ever take your medications differently than prescribed? no ? ?Do you have issues affording your medications? no ? ?Do you have any concerns with mobility at home? Uses a cane and gets SOB very easily. ? ?Do you have a PCP? Dr. Harvie Heck ? ?Do you have any trouble reading or  writing? no ? ?Are there any additional barriers you see to getting the care you need? Admits to recent drug use and smoking but states that he is done with both- reports last use was a month ago.  Does not want resources at this time- CSW encouraged pt to reach out if he decides he needs further support or assistance to stop. ? ?                ? ?HRT/VAS Care Coordination   ? ? Patients Home Cardiology Office Heart Failure Clinic  ? Outpatient Care Team Social Worker; Community Paramedicine  ? Community Paramedic Name: Marylouise Stacks- [phone]  ? Social Worker Name: Tammy Sours- [facility]- [phone]  ? Living arrangements for the past 2 months Apartment  ? Lives with: Adult Children; Relatives  ? Patient Current Therapist, music Medicare; Medicaid  ? Patient Has Concern With Paying Medical Bills No  ? Does Patient Have Prescription Coverage? Yes  ? Home Assistive Devices/Equipment None  ?  DME Agency NA  ? [place]  ? ?  ? ? ?Social History:                                                                             ?SDOH Screenings  ? ?Alcohol Screen: Not on file  ?Depression (PHQ2-9): Medium Risk  ? PHQ-2 Score: 7  ?Financial Resource Strain: Low Risk   ? Difficulty of Paying Living Expenses: Not very hard  ?Food Insecurity: No Food Insecurity  ? Worried About Charity fundraiser in the Last Year: Never true  ? Ran Out of Food in the Last Year: Never true  ?Housing: Low Risk   ? Last Housing Risk Score: 0  ?Physical Activity: Not on file  ?Social Connections: Not on file  ?Stress: Not on file  ?Tobacco Use: High Risk  ? Smoking Tobacco Use: Every Day  ? Smokeless Tobacco Use: Never  ? Passive Exposure: Not on file  ?Transportation Needs: No Transportation Needs  ? Lack of Transportation (Medical): No  ? Lack of Transportation (Non-Medical): No  ? ? ?SDOH Interventions: ?Financial Resources:  Financial Strain Interventions: Intervention Not Indicated ?Receives $950 in  disability benefits a month  ?Food Insecurity:  Food Insecurity Interventions: Intervention Not Indicated  ?Housing Insecurity:  Housing Interventions: Intervention Not Indicated  ?Transportation:   Transportation Interventions: Intervention Not Indicated  ? ?Other Care Navigation Interventions:    ? ?Inpatient/Outpatient Substance Abuse Counseling/Rehab Options Pt reported he didn't need  ?Provided Pharmacy assistance resources  N/a  ?Patient expressed Mental Health concerns No.  ?Patient Referred to: N/a  ? ?Follow-up plan:   ? ?CSW mailing out SCAT application ? ?Referral sent to paramedics for assignment and follow up ? ?[name], LCSW ?Clinical Social Worker ?Advanced Heart Failure Clinic ?Desk#: [phone] ?Cell#: [phone] ? ? ? ?

## 2021-10-11 NOTE — Progress Notes (Signed)
?  Mobility Specialist Criteria Algorithm Info. ? ? ? 10/11/21 1130  ?Mobility  ?Activity Ambulated with assistance in hallway  ?Range of Motion/Exercises Active;All extremities  ?Level of Assistance Contact guard assist, steadying assist  ?Assistive Device Cane  ?Distance Ambulated (ft) 200 ft  ?Activity Response Tolerated well  ? ?Patient received dangling EOB agreeable to participate. Ambulated min guard in hallway with slow gait. Returned to room without complaint or incident. Was left with all needs met, call bell in reach. ? ?10/11/2021 ?11:30 AM ? ?[place] Nayelly Laughman, CMS, BS EXP ?Acute Rehabilitation Services  ?QVHQI:[phone] ?Office: [phone] ? ?

## 2021-10-11 NOTE — Telephone Encounter (Signed)
Needs hospital f/u (30 min) with Dr. Silas Flood in next 2-4 weeks. Thanks! ?

## 2021-10-11 NOTE — Progress Notes (Signed)
RN attempted to give CHG bath three times during the course of the shift. Patient has informed the RN and NT at breakfast, "to come back after breakfast to do it". RN rounded after breakfast and patient wanted to do it after the personal phone call. RN rounded after lunch to perform CHG bath onto the patient and patient refused. Patient states "I am being discharged, I will get to it later". AD notified.  ?

## 2021-10-11 NOTE — TOC Transition Note (Signed)
Transition of Care (TOC) - CM/SW Discharge Note ? ? ?Patient Details  ?Name: Edward Mullen ?MRN: [id number] ?Date of Birth: 08/23/1955 ? ?Transition of Care (TOC) CM/SW Contact:  ?Zenon Mayo, RN ?Phone Number: ?10/11/2021, 11:57 AM ? ? ?Clinical Narrative:    ?Patient is for dc today, he has the life vest in the room.  He states his daughter will be taking him home today she does not get off work til 3 pm.   ? ? ?Final next level of care: Home/Self Care ?Barriers to Discharge: No Barriers Identified ? ? ?Patient Goals and CMS Choice ?Patient states their goals for this hospitalization and ongoing recovery are:: return home ?  ?  ? ?Discharge Placement ?  ?           ?  ?  ?  ?  ? ?Discharge Plan and Services ?  ?  ?           ?  ?DME Agency: NA ?  ?  ?  ?HH Arranged: NA ?  ?  ?  ?  ? ?Social Determinants of Health (SDOH) Interventions ?  ? ? ?Readmission Risk Interventions ?Readmission Risk Prevention Plan 06/10/2019  ?Transportation Screening Complete  ?PCP or Specialist Appt within 5-7 Days Complete  ?Home Care Screening Complete  ?Medication Review (RN CM) Complete  ?Some recent data might be hidden  ? ? ? ? ? ?

## 2021-10-11 NOTE — Progress Notes (Signed)
? ?  NAME:  Edward Mullen, MRN:  [id number], DOB:  [birth year], LOS: 7 ?ADMISSION DATE:  10/04/2021, CONSULTATION DATE:  10/07/2021 ?REFERRING MD:  Heber , IMTS, CHIEF COMPLAINT:  Hemoptysis  ? ?History of Present Illness:  ?66 yo male with hx of tobacco abuse, marijuana abuse, and cocaine abuse developed cough with clear sputum, shortness of breath and leg swelling.  Admitted for CHF exacerbation.  Started coughing up blood with last episode earlier this afternoon.  Denies epistaxis or hematemesis.  Not aware of having difficulty swallowing.  Reports last time he smoked or used substances was few weeks ago when he started having more trouble with his breathing.  Not having fever or weight loss, but has intermittent sweats.  ? ?Pertinent  Medical History  ?HTN, DM, chronic systolic HF, polysubstance abuse, NSVT ? ?Significant Hospital Events: ?Including procedures, antibiotic start and stop dates in addition to other pertinent events   ?2/23 admitted for HF. IV lasix given  ?2/24 new hemoptysis. CT chest new patchy infiltrates in the right middle lobe and left lower lobe suggesting multifocal pneumonia. Small left pleural effusion. LMWH stopped. Started on rocephin. ECHO 20-25% W/ GLOBAL HYPOKINESIS grade 2 diastolic dysfxn. UDS neg  ?2/25 pulm asked to see for cxr progression and cont hemoptysis  ?2/28 TXA neb ?3/1 cough and hemoptysis improving ?3/2 hemoptysis scant ? ?Interim History / Subjective:  ?Cough improving, hemoptysis improving. Repeat TXA neb. ? ?Objective   ?Blood pressure 116/83, pulse 68, temperature 97.6 ?F (36.4 ?C), temperature source Oral, resp. rate 20, height 6' (1.829 m), weight 87.9 kg, SpO2 92 %. ?CVP:  [5 mmHg-9 mmHg] 9 mmHg  ?FiO2 (%):  [100 %] 100 %  ? ?Intake/Output Summary (Last 24 hours) at 10/11/2021 0933 ?Last data filed at 10/11/2021 0801 ?Gross per 24 hour  ?Intake 717 ml  ?Output 750 ml  ?Net -33 ml  ? ? ?Filed Weights  ? 10/08/21 0500 10/09/21 0425 10/11/21 0442  ?Weight: 88.4 kg  88.9 kg 87.9 kg  ? ? ?Examination: ? ?General - alert ?Eyes - pupils reactive ?ENT - no sinus tenderness, no stridor ?Cardiac - regular rate/rhythm, no murmur ?Chest - clear ?Abdomen - soft, non tender, + bowel sounds ?Extremities - no cyanosis, clubbing, or edema ?Skin - no rashes ?Neuro - normal strength, moves extremities, follows commands ?Psych - normal mood and behavior ? ? ?Assessment & Plan:  ? ?Hemoptysis with progressive Rt sided infiltrate. Improving. ?- concern he could have CAP or aspiration pneumonitis (although initial PCT is negative), likely exacerbated with elevated pulmonary venous pressures with volume overload ?- RML GGOs on CT possible source, no mass ?- s/p CAP treatment ?- s/p nebulized TXA 2/28, repeat 3/2 ?- given heart failure with recent inotrope support he is poor candidate for bronchoscopy currently but can re-evaluate as needed ?- hold lovenox and ASA for now ?  ?Small Lt pleural effusion. ?- likely in setting of acute on chronic systolic CHF ?- f/u CXR shows improvement ? ?PCCM sill sign off, will arrange OP f/u. ? ?Signature:  ?Lanier Clam, MD ?[place] ?See Amion for contact info ?10/11/2021, 9:33 AM ? ? ?

## 2021-10-11 NOTE — Progress Notes (Signed)
Patient ID: Edward Mullen, male   DOB: May 03, 1956, 66 y.o.   MRN: [id number]    Advanced Heart Failure Rounding Note   Subjective:    Admitted w/ PNA and a/c CHF w/ low output. PICC placed 2/24. Co-ox 43%. Started on milrinone.  2/27: Milrinone decreased to 0.125. IV lasix stopped>>transitioned to PO. 2/28: Milrinone stopped.   Co-ox 60% today off milrinone.  SBP 90s-110s.  CVP 8. Creatinine stable at 1.4 => 1.57.   Hemoptysis improved but still present, had TXA nebs 2/28.  Pulmonary following.  He has completed abx for PNA.  Holding Lovenox and ASA.  H/H stable.   Able to walk without dyspnea.   He is on amiodarone gtt for NSVT, no further episodes.  LifeVest in room.  LHC/RHC (3/1): 1. Stable coronaries, there is 80% stenosis in a relatively small PLV branch.  2. Mildly elevated PCWP and LVEDP, normal RA pressure.  3. Cardiac index is decreased at 1.9.   Objective:   Vital Signs:   Temp:  [97.1 F (36.2 C)-97.8 F (36.6 C)] 97.6 F (36.4 C) (03/02 0738) Pulse Rate:  [68-78] 68 (03/02 0800) Resp:  [0-31] 20 (03/02 0738) BP: (100-144)/(71-99) 116/83 (03/02 0738) SpO2:  [91 %-100 %] 92 % (03/02 0849) FiO2 (%):  [100 %] 100 % (03/01 2001) Weight:  [87.9 kg] 87.9 kg (03/02 0442) Last BM Date : 10/11/21  Weight change: Filed Weights   10/08/21 0500 10/09/21 0425 10/11/21 0442  Weight: 88.4 kg 88.9 kg 87.9 kg    Intake/Output:   Intake/Output Summary (Last 24 hours) at 10/11/2021 0851 Last data filed at 10/11/2021 0801 Gross per 24 hour  Intake 717 ml  Output 750 ml  Net -33 ml     PHYSICAL EXAM: CVP 8 General: NAD Neck: No JVD, no thyromegaly or thyroid nodule.  Lungs: Clear to auscultation bilaterally with normal respiratory effort. CV: Lateral PMI.  Heart regular S1/S2, no S3/S4, no murmur.  No peripheral edema.   Abdomen: Soft, nontender, no hepatosplenomegaly, no distention.  Skin: Intact without lesions or rashes.  Neurologic: Alert and oriented x 3.   Psych: Normal affect. Extremities: No clubbing or cyanosis.  HEENT: Normal.   Telemetry: NSR 70s-80s, personally reviewed  Labs: Basic Metabolic Panel: Recent Labs  Lab 10/05/21 0517 10/05/21 1511 10/06/21 0238 10/07/21 0500 10/08/21 0538 10/09/21 0600 10/10/21 0415 10/10/21 1119 10/11/21 0430  NA 140   < > 139 136 137 136 137 138   138 138  K 4.0   < > 3.9 3.2* 3.4* 4.4 4.0 4.5   4.5 4.9  CL 109   < > 104 97* 98 101 105  --  104  CO2 20*   < > 23 27 31 28 25   --  24  GLUCOSE 115*   < > 102* 238* 192* 178* 147*  --  98  BUN 28*   < > 34* 24* 17 14 15   --  19  CREATININE 1.93*   < > 1.97* 1.60* 1.60* 1.43* 1.41*  --  1.57*  CALCIUM 8.5*   < > 8.5* 7.9* 8.1* 8.1* 8.1*  --  8.5*  MG 1.7  --  1.9 1.9 2.0 2.0  --   --   --    < > = values in this interval not displayed.    Liver Function Tests: Recent Labs  Lab 10/05/21 0517  AST 36  ALT 29  ALKPHOS 63  BILITOT 0.9  PROT 5.5*  ALBUMIN 2.8*  No results for input(s): LIPASE, AMYLASE in the last 168 hours. No results for input(s): AMMONIA in the last 168 hours.  CBC: Recent Labs  Lab 10/05/21 0517 10/06/21 1137 10/06/21 1843 10/07/21 0500 10/08/21 0936 10/09/21 1229 10/10/21 0415 10/10/21 1119 10/11/21 0430  WBC 5.2 12.1*   < > 10.2 8.4 8.5 8.5  --  9.0  NEUTROABS 4.5 9.2*  --  7.5  --   --   --   --   --   HGB 12.9* 12.7*   < > 12.6* 12.9* 13.9 13.2 14.6   14.6 13.2  HCT 41.0 39.0   < > 38.3* 39.0 41.9 41.7 43.0   43.0 41.8  MCV 89.7 88.0   < > 87.6 87.6 87.8 88.5  --  87.8  PLT 144* 158   < > 149* 147* 166 169  --  164   < > = values in this interval not displayed.    Cardiac Enzymes: No results for input(s): CKTOTAL, CKMB, CKMBINDEX, TROPONINI in the last 168 hours.  BNP: BNP (last 3 results) Recent Labs    11/28/20 1042 10/04/21 1153  BNP 2,565.3* 3,768.9*    ProBNP (last 3 results) No results for input(s): PROBNP in the last 8760 hours.    Other results:  Imaging: CARDIAC  CATHETERIZATION  Result Date: 10/10/2021   1st RPL lesion is 80% stenosed.   Mid LAD lesion is 30% stenosed. 1. Stable coronaries, there is 80% stenosis in a relatively small PLV branch. 2. Mildly elevated PCWP and LVEDP, normal RA pressure. 3. Cardiac index is decreased at 1.9.     Medications:     Scheduled Medications:  amiodarone  200 mg Oral BID   aspirin EC  81 mg Oral Daily   Chlorhexidine Gluconate Cloth  6 each Topical Daily   digoxin  0.125 mg Oral Daily   empagliflozin  10 mg Oral Daily   ipratropium-albuterol  3 mL Nebulization BID   losartan  12.5 mg Oral Daily   rosuvastatin  5 mg Oral Daily   sodium chloride flush  10-40 mL Intracatheter Q12H   sodium chloride flush  3 mL Intravenous Q12H   spironolactone  25 mg Oral Daily   torsemide  40 mg Oral Daily    Infusions:  sodium chloride      PRN Medications: sodium chloride, acetaminophen **OR** acetaminophen, albuterol, ondansetron (ZOFRAN) IV, sodium chloride flush, sodium chloride flush   Assessment:   1. Acute on Chronic Biventricular Systolic Heart Failure:  EF 15-20% with moderate to severe MR on echo 1/18.  Nonischemic cardiomyopathy. HTN vs cocaine use vs viral vs ETOH. HIV negative, SPEP with no M-spike.  Patient has been noted to have CAD but not enough to explain cardiomyopathy (Cath this admission with stable 80% PLV stenosis).  Echo in 8/18 showed EF up to 50%. However, he stopped his meds and started cocaine again, echo in 4/22 with EF back down to 20-25% => suspect cocaine abuse may be the primary etiology of cardiomyopathy. Poor compliance w/ meds and f/u in the [facility].  Now admitted w/ NYHA Class IIIb symptoms and marked fluid overload. UDS negative. Echo this admission with EF 20-25%, RV mildly reduced. Initially w/ worsening renal function w/ diuresis, concerning for low output HF. PICC placed 2/24, Initial Co-ox 43%, Lactic acid 2.1. Started on milrinone 0.25. Co-ox improved + improved diuresis.  Now  weaned off milrinone. RHC yesterday with mildly elevated PCWP and LVEDP, CI 1.9.  Today, co-ox 60% with  creatinine 1.57.  CVP 8.  - Torsemide 40 mg po daily.  - Continue digoxin 0.125 - Continue Jardiance 10 mg daily - Continue spironolactone 25 mg daily - BP too soft for Entresto restart. Continue losartan 12.5 mg daily.  - no ? blocker w/ low output  - w/ frequent runs of VT/ NSVT, recommend LifeVest at discharge.  - Not a candidate for advanced therapies including home inotropes given h/o polysubstance use and poor compliance  - Recommend paramedicine at d/c  - If he can stay off cocaine, should be CRT-D candidate with wide LBBB.  2. VT/NSVT: Multiple runs this admit, longest was 28 beats. HS trop trend not c/w ACS. Coronary angiography showed no change in coronaries.  - LifeVest at discharge  - Now that off milrinone, will transition amiodarone to po.  3. AKI on CKD III: Baseline SCr 1.6.  Cardiorenal/ low output, improved w/ milrinone + diuresis.  Creatinine 1.57 today.  4. Hemoptysis: CT w/contrast not suggestive of PE, no evidence of nodules/masses.  May be due to PNA vs aspiration pneumonitis. Hgb remains stable, he has been treated for possible PNA with ceftriaxone. - pulmonology following, may need bronchoscopy but likely after reassessment as outpatient.  - Holding ASA for now.  5. Multifocal PNA: Chest CT w/ patchy infiltrates in the right middle lobe and left lower lobe, concerning for possible PNA but afebrile w/ normal WBC. PCT <0.10.  - on empiric abx w/ ceftriaxone => completed.  - pulmonology following  6. CAD: 80% stenosis PLV on cath this admission.  No chest pain. Hs trop 39>>43, c/w demand ischemia - continue statin  - holding ASA w/ hemoptysis, restart when resolved.  - off ? blocker w/ low output  7. Cocaine abuse: Last use about a month prior to admission.   Patient stable from cardiac standpoint.  I would be ok with him going home today with close followup in CHF  clinic, with pulmonology, and with paramedicine.  Cardiac meds for home: Amiodarone 200 mg bid x 2 wks then 200 daily, losartan 12.5 daily, torsemide 40 daily, spironolactone 25 daily, Jardiance 10 daily, digoxin 0.125 daily, Crestor 5 daily, restart ASA 81 when hemoptysis resolved.   Loralie Champagne 10/11/2021 8:51 AM

## 2021-10-11 NOTE — Care Management Important Message (Signed)
Important Message ? ?Patient Details  ?Name: Edward Mullen ?MRN: [id number] ?Date of Birth: 1956-05-02 ? ? ?Medicare Important Message Given:  Yes ? ? ? ? ?Shelda Altes ?10/11/2021, 9:21 AM ?

## 2021-10-11 NOTE — Discharge Summary (Addendum)
Name: Edward Mullen MRN: [id number] DOB: 03/10/1956 66 y.o. PCP: Harvie Heck, MD  Date of Admission: 10/04/2021 11:37 AM Date of Discharge:  10/11/2021 Attending Physician: Dr. Daryll Drown  DISCHARGE DIAGNOSIS:  Primary Problem: Acute exacerbation of CHF (congestive heart failure) [facility])   Hospital Problems: Principal Problem:   Acute exacerbation of CHF (congestive heart failure) (HCC) Active Problems:   Polysubstance abuse ([place])   Acute on chronic systolic heart failure (HCC)   Hemoptysis   Type 2 diabetes mellitus with stage 3b chronic kidney disease, without long-term current use of insulin (HCC)    DISCHARGE MEDICATIONS:   Allergies as of 10/11/2021   No Known Allergies      Medication List     STOP taking these medications    Entresto 24-26 MG Generic drug: sacubitril-valsartan   eplerenone 25 MG tablet Commonly known as: INSPRA   furosemide 20 MG tablet Commonly known as: Lasix       TAKE these medications    albuterol 108 (90 Base) MCG/ACT inhaler Commonly known as: VENTOLIN HFA INHALE 1 TO 2 PUFFS BY MOUTH EVERY 6 HOURS AS NEEDED FOR WHEEZING OR SHORTNESS OF BREATH   albuterol (2.5 MG/3ML) 0.083% nebulizer solution Commonly known as: PROVENTIL Take 2.5 mg by nebulization every 6 (six) hours as needed for shortness of breath or wheezing.   amiodarone 200 MG tablet Commonly known as: PACERONE Take 1 tablet (200 mg total) by mouth BID X 2 weeks than 200mg  daily   bismuth subsalicylate 518 AC/16SA suspension Commonly known as: PEPTO BISMOL Take 30 mLs by mouth every 6 (six) hours as needed for indigestion.   Blood Pressure Monitor Devi Please use to monitor blood pressure as directed.  ICD: I10 (hypertension)   digoxin 0.125 MG tablet Commonly known as: LANOXIN TAKE 1 TABLET (0.125 MG TOTAL) BY MOUTH DAILY.   Jardiance 10 MG Tabs tablet Generic drug: empagliflozin TAKE 1 TABLET (10 MG TOTAL) BY MOUTH DAILY. What changed: how much to take    losartan 25 MG tablet Commonly known as: COZAAR Take 1/2 tablet (12.5 mg total) by mouth daily.   rosuvastatin 5 MG tablet Commonly known as: CRESTOR TAKE 1 TABLET (5 MG TOTAL) BY MOUTH DAILY. What changed: how much to take   spironolactone 25 MG tablet Commonly known as: ALDACTONE Take 1 tablet (25 mg total) by mouth daily.   tamsulosin 0.4 MG Caps capsule Commonly known as: FLOMAX TAKE ONE CAPSULE BY MOUTH ONCE DAILY AFTER SUPPER What changed: See the new instructions.   torsemide 20 MG tablet Commonly known as: DEMADEX Take 2 tablets (40 mg total) by mouth daily.               Durable Medical Equipment  (From admission, onward)           Start     Ordered   10/08/21 0804  For home use only DME Vest life vest  Once       Comments: Nonischemic Cardiomyopathy and NSVT, EF <35%,   Duration 3 months   10/08/21 0804            DISPOSITION AND FOLLOW-UP:  Edward Mullen was discharged from [facility] in Stable condition. At the hospital follow up visit please address:  Acute on chronic biventricular systolic heart failure, NYHA Class IIIb Nonischemic cardiomyopathy Please ensure patient is adhering to cardiac regimen after discharge: Amiodarone 200 mg BID for two weeks then 200 mg daily, losartan 12.5 daily, torsemide 40 daily,  spironolactone 25 daily, Jardiance 10 daily, digoxin 0.125 mg daily, Crestor 5 daily. Patient will need close follow-up with CHF clinic and paramedicine. Delene Loll was not restarted due to low BP, and B blockers not given due to low output status.   Multiple pulmonary infiltrates Hemoptysis Patient will need close follow-up with pulmonology and would benefit from outpatient PFTs. We will advise him to hold aspirin until his hemoptysis is completely resolved.   VT/NSVT Follow-up on LifeVest compliance. Monitor rhythm closely for appropriate response to amiodarone.  Hx hypertension Entresto was not restarted  due to low BP, and B blockers not given due to low output status. Follow BP closely as these were not restarted on discharge. He is being discharged on losartan 12.5 mg daily.  CKD stage IIIa Recheck BMP for resolution of AKI.   Tobacco use disorder Polysubstance use Continue to counseled on cessation of illicit substances and encourage continued abstinence from tobacco and alcohol. He could be a candidate for CRT-D if he no longer uses cocaine.   Suspected inguinal hernia Reassess for presence of possible inguinal hernia. Consider further work-up and treatment.  Follow-up Recommendations: Consults: Pulmonary medicine Labs: Basic Metabolic Profile Studies: PFTs, evaluation of possible inguinal hernia. Medications:   STOP taking: Entresto (sacubitril-valsartan) Inspra (eplerenone) Lasix (furosemide) START taking: Losartan 12.5 mg daily Spironolactone 25 mg daily Torsemide 40 mg daily CONTINUE taking: Amiodarone 200 mg BID for 2 weeks then 200 mg daily Digoxin 0.125 mg daily Jardiance 10 mg daily Rosuvastatin 5 mg daily Tamsulosin 0.4 mg daily  Follow-up Appointments:  Follow-up Information     [facility] Follow up on 10/22/2021.   Specialty: Cardiology Why: [facility] 3:30 pm Entrance C, Garage Code 2355 Contact information: [address] [id number] [place] [address] [phone]        Harvie Heck, MD Follow up.   Specialty: Internal Medicine Contact information: [address] [place] [address] [phone]                 HOSPITAL COURSE:  Patient Summary: Acute on chronic biventricular systolic heart failure, NYHA Class IIIb Nonischemic cardiomyopathy Patient presented with dyspnea on exertion, orthopnea, lower extremity edema, hypervolemia on exam, and elevated BNP of over 3000. Nonspecific ST wave changes on EKG.  Troponins relatively flat  40, 30.  He was started on IV Lasix 80 mg twice daily and home jardiance and digoxin were continued. Echocardiogram obtained and showed basal to mid inferoseptal and inferior akinesis,alse tendon, LV EF 20 to 25%, severely decreased function, global hypokinesis, internal cavity size was moderately to severely dilated, mild concentric left ventricular hypertrophy, consistent with Grade II diastolic dysfunction; RV systolic function is mildly reduced, moderately elevated pulmonary artery systolic pressure; severely dilated LA and RA. Wall motion abnormalities were new from last echocardiogram 11/2020. Cardiology was consulted and started the patient on milrinone 0.25 mcg/kg/min 02/25, weaned 02/27, and discontinued 02/28. He was started on spironolactone 02/27. He underwent [facility] 03/01 which showed 1st RPL lesion 80% stenosed, mid LAD lesion 30% stenosed, stable coronaries with 80% stenosis in a relatively small PLV branch, mildly elevated PCWP and LVEDP, normal RA pressure, cardiac index decreased at 1.9.  Patient was transitioned to a new regimen of medications noted above.  He was not started on beta blocker or entresto at the time of discharge due to low blood pressures.  He will need close follow up with the CHF team, cardiac rehab  and paramedicine.  He would likely be a candidate for CRT-D if he abstains from substance use.    Multiple pulmonary infiltrates Hemoptysis Patient presented with wheezing improved with DuoNeb treatment. Initial chest x-ray revealed stable cardiomegaly and mild left basilar scarring without active lung disease. Patient developed hemoptysis with cough on HD1 prompting CT chest with showed new patchy infiltrates in the right middle lobe and left lower lobe suggesting multifocal pneumonia, small left pleural effusion, coronary artery calcifications are seen, enlarged heart. Procalcitonin was negative and he was afebrile and hemodynamically stable. However, due to concerns for CAP, he  was started on ceftriaxone 1 g daily for 5 days. Home aspirin was held and SQ Lovenox was held as well. Pulmonology was consulted and felt that he was coughing up old blood and deferred bronchoscopy due to severity of heart failure. Over the course of admission hemoptysis and cough improved.  Per CHF team, his aspirin should be restarted once hemoptysis improves.  He will also follow up with pulmonary in the outpatient setting for further evaluation of his hemoptysis and possible bronchoscopy.    VT/NSVT Patient had episodes of NSVT on telemetry throughout admission. He was started on amiodarone gtt 02/25 and recommended to discharge with Life Vest which was delivered to bedside during admission. He was transitioned to amiodarone PO.    Hx hypertension Patient was largely hypotensive throughout admission. Entresto and beta blockers were held. Losartan was started 03/01.  CAD Patient endorsed some episodes of chest pain that improved with breathing treatment throughout admission. He was continued on home Crestor.   CKD stage IIIa Patient had slight bump in serum creatinine from baseline but this resolved with aggressive diuresis.   Tobacco use disorder Polysubstance use UDS was negative at admission. He was counseled on cessation of illicit substances and encouraged to maintain abstinence from tobacco and alcohol.   Suspected inguinal hernia Noted on physical exam at admission.    DISCHARGE INSTRUCTIONS:   Discharge Instructions     Diet - low sodium heart healthy   Complete by: As directed    Discharge instructions   Complete by: As directed    Edward Mullen,  It was a pleasure to care for you during your stay at [facility]. I am glad that your swelling and breathing have gotten so much better!  You will need to follow-up closely with cardiology and pulmonology.  Several medication changes were made during this admission and include: 1. STOP taking:  a. Entresto  (sacubitril-valsartan)  b. Inspra (eplerenone)  c. Lasix (furosemide) 2. START taking:  a. Losartan 12.5 mg daily  b. Spironolactone 25 mg daily  c. Torsemide 40 mg daily 3. CONTINUE taking:  a. Amiodarone 200 mg BID followed by once daily  b. Digoxin 0.125 mg daily  c. Jardiance 10 mg daily  d. Rosuvastatin 5 mg daily  e. Tamsulosin 0.4 mg daily  As we discussed, I recommend you abstain from tobacco, alcohol, and illicit substances.  My best, Dr. Marlou Sa   Increase activity slowly   Complete by: As directed        SUBJECTIVE:  Patient evaluated at bedside on day of discharge. He feels much better from admission and is eager to go home. He is still having hemoptysis but this is improved. He understands it is important to abstain from cocaine, tobacco, and alcohol. Discharge Vitals:   BP 99/82 (BP Location: Left Arm)    Pulse 75    Temp 97.7 F (36.5 C) (Oral)  Resp 18    Ht 6' (1.829 m)    Wt 87.9 kg    SpO2 97%    BMI 26.27 kg/m   OBJECTIVE:  Constitutional:Chronically ill-appearing gentleman resting comfortably in bed. No acute distress. Cardio:Regular rate and rhythm. No murmurs, rubs, gallops. Pulm:Clear to auscultation bilaterally. Abdomen:Soft, nontender, nondistended. OIZ:[id number] for bilateral LE edema. Skin:Warm and dry. Neuro:Alert and oriented x3. No focal deficit noted. Psych:Normal mood and affect.    Pertinent Labs, Studies, and Procedures:  CBC Latest Ref Rng & Units 10/11/2021 10/10/2021 10/10/2021  WBC 4.0 - 10.5 K/uL 9.0 - -  Hemoglobin 13.0 - 17.0 g/dL 13.2 14.6 14.6  Hematocrit 39.0 - 52.0 % 41.8 43.0 43.0  Platelets 150 - 400 K/uL 164 - -    CMP Latest Ref Rng & Units 10/11/2021 10/10/2021 10/10/2021  Glucose 70 - 99 mg/dL 98 - -  BUN 8 - 23 mg/dL 19 - -  Creatinine 0.61 - 1.24 mg/dL 1.57(H) - -  Sodium 135 - 145 mmol/L 138 138 138  Potassium 3.5 - 5.1 mmol/L 4.9 4.5 4.5  Chloride 98 - 111 mmol/L 104 - -  CO2 22 - 32 mmol/L 24 - -  Calcium 8.9 - 10.3  mg/dL 8.5(L) - -  Total Protein 6.5 - 8.1 g/dL - - -  Total Bilirubin 0.3 - 1.2 mg/dL - - -  Alkaline Phos 38 - 126 U/L - - -  AST 15 - 41 U/L - - -  ALT 0 - 44 U/L - - -    DG Chest 2 View  Result Date: 10/04/2021 CLINICAL DATA:  Chest pain and shortness of breath for 2 days. Lower extremity swelling. EXAM: CHEST - 2 VIEW COMPARISON:  10/01/2021 FINDINGS: Moderate to severe cardiac enlargement is stable. Tortuosity of thoracic aorta is unchanged. Mild scarring in left lung base is again seen. No evidence of pulmonary edema or other acute infiltrate. No evidence pleural effusion. IMPRESSION: Stable cardiomegaly and mild left basilar scarring. No active lung disease. Electronically Signed   By: Marlaine Hind M.D.   On: 10/04/2021 12:15   CT CHEST W CONTRAST  Result Date: 10/05/2021 CLINICAL DATA:  Hemoptysis EXAM: CT CHEST WITH CONTRAST TECHNIQUE: Multidetector CT imaging of the chest was performed during intravenous contrast administration. RADIATION DOSE REDUCTION: This exam was performed according to the departmental dose-optimization program which includes automated exposure control, adjustment of the mA and/or kV according to patient size and/or use of iterative reconstruction technique. CONTRAST:  74mL OMNIPAQUE IOHEXOL 300 MG/ML  SOLN COMPARISON:  CT done on 06/10/2019, radiographs done on 10/04/2021 FINDINGS: Cardiovascular: Heart is enlarged in size. There is dilation of left ventricular cavity. There are scattered coronary artery calcifications. Contrast density in the thoracic aorta is less than adequate to evaluate the lumen. There are no intraluminal filling defects in the central pulmonary artery branches. Mediastinum/Nodes: Slightly enlarged lymph nodes in the mediastinum appear more prominent. Lungs/Pleura: There is interval appearance of small left pleural effusion. There is new patchy infiltrate in the right middle lobe. There are faint ground-glass infiltrates in the left lower lobe.  Upper Abdomen: Calcifications are seen in the renal artery branches. There is edema in the subcutaneous plane suggesting anasarca. Musculoskeletal: Degenerative changes are noted in the thoracic and lower cervical spine. There is encroachment of neural foramina at C5-C6 and C6-C7 levels in the lower cervical spine. IMPRESSION: There are new patchy infiltrates in the right middle lobe and left lower lobe suggesting multifocal pneumonia. Small left pleural effusion.  Coronary artery calcifications are seen.  Heart is enlarged in size. Other findings as described in the body of the report. Electronically Signed   By: Elmer Picker M.D.   On: 10/05/2021 13:53   ECHOCARDIOGRAM COMPLETE  Result Date: 10/05/2021    ECHOCARDIOGRAM REPORT   Patient Name:   TROYE HIEMSTRA Date of Exam: 10/05/2021 Medical Rec #:  [id number]         Height:       72.0 in Accession #:    [id number]        Weight:       209.6 lb Date of Birth:  07/01/1956        BSA:          2.173 m Patient Age:    82 years          BP:           111/90 mmHg Patient Gender: M                 HR:           78 bpm. Exam Location:  Inpatient Procedure: 2D Echo, Cardiac Doppler and Color Doppler Indications:    CHF  History:        Patient has prior history of Echocardiogram examinations. CHF                 and Hypertrophic Cardiomyopathy; Risk Factors:Hypertension.  Sonographer:    Jyl Heinz Referring Phys: [place]  1. Basal to mid inferoseptal and inferior akinesis. False tendon. Left ventricular ejection fraction, by estimation, is 20 to 25%. The left ventricle has severely decreased function. The left ventricle demonstrates global hypokinesis. The left ventricular internal cavity size was moderately to severely dilated. There is mild concentric left ventricular hypertrophy. Left ventricular diastolic parameters are consistent with Grade II diastolic dysfunction (pseudonormalization).  2. Right ventricular systolic function  is mildly reduced. The right ventricular size is normal. There is moderately elevated pulmonary artery systolic pressure.  3. Left atrial size was severely dilated.  4. Right atrial size was severely dilated.  5. The mitral valve is normal in structure. Trivial mitral valve regurgitation. No evidence of mitral stenosis.  6. Tricuspid valve regurgitation is mild to moderate.  7. The aortic valve is tricuspid. Aortic valve regurgitation is mild to moderate. No aortic stenosis is present.  8. Aortic dilatation noted. There is mild dilatation of the aortic root, measuring 37 mm. There is mild dilatation of the ascending aorta, measuring 40 mm.  9. The inferior vena cava is dilated in size with <50% respiratory variability, suggesting right atrial pressure of 15 mmHg. FINDINGS  Left Ventricle: Basal to mid inferoseptal and inferior akinesis. False tendon. Left ventricular ejection fraction, by estimation, is 20 to 25%. The left ventricle has severely decreased function. The left ventricle demonstrates global hypokinesis. The left ventricular internal cavity size was moderately to severely dilated. There is mild concentric left ventricular hypertrophy. Left ventricular diastolic parameters are consistent with Grade II diastolic dysfunction (pseudonormalization). Right Ventricle: The right ventricular size is normal. No increase in right ventricular wall thickness. Right ventricular systolic function is mildly reduced. There is moderately elevated pulmonary artery systolic pressure. The tricuspid regurgitant velocity is 3.22 m/s, and with an assumed right atrial pressure of 15 mmHg, the estimated right ventricular systolic pressure is 48.1 mmHg. Left Atrium: Left atrial size was severely dilated. Right Atrium: Right atrial size was severely dilated. Pericardium: There is no evidence of  pericardial effusion. Mitral Valve: The mitral valve is normal in structure. Trivial mitral valve regurgitation. No evidence of mitral  valve stenosis. Tricuspid Valve: The tricuspid valve is normal in structure. Tricuspid valve regurgitation is mild to moderate. No evidence of tricuspid stenosis. Aortic Valve: The aortic valve is tricuspid. Aortic valve regurgitation is mild to moderate. Aortic regurgitation PHT measures 450 msec. No aortic stenosis is present. Aortic valve peak gradient measures 8.3 mmHg. Pulmonic Valve: The pulmonic valve was normal in structure. Pulmonic valve regurgitation is mild. No evidence of pulmonic stenosis. Aorta: Aortic dilatation noted. There is mild dilatation of the aortic root, measuring 37 mm. There is mild dilatation of the ascending aorta, measuring 40 mm. Venous: The inferior vena cava is dilated in size with less than 50% respiratory variability, suggesting right atrial pressure of 15 mmHg. IAS/Shunts: No atrial level shunt detected by color flow Doppler.  LEFT VENTRICLE PLAX 2D LVIDd:         7.30 cm      Diastology LVIDs:         6.80 cm      LV e' medial:    2.40 cm/s LV PW:         1.10 cm      LV E/e' medial:  41.4 LV IVS:        1.30 cm      LV e' lateral:   11.00 cm/s LVOT diam:     2.20 cm      LV E/e' lateral: 9.0 LV SV:         65 LV SV Index:   30 LVOT Area:     3.80 cm  LV Volumes (MOD) LV vol d, MOD A2C: 369.0 ml LV vol d, MOD A4C: 389.0 ml LV vol s, MOD A2C: 269.0 ml LV vol s, MOD A4C: 281.0 ml LV SV MOD A2C:     100.0 ml LV SV MOD A4C:     389.0 ml LV SV MOD BP:      104.4 ml RIGHT VENTRICLE            IVC RV Basal diam:  4.40 cm    IVC diam: 2.30 cm RV Mid diam:    3.40 cm RV S prime:     8.45 cm/s TAPSE (M-mode): 1.4 cm LEFT ATRIUM             Index        RIGHT ATRIUM           Index LA diam:        4.50 cm 2.07 cm/m   RA Area:     25.50 cm LA Vol (A2C):   99.8 ml 45.92 ml/m  RA Volume:   89.00 ml  40.95 ml/m LA Vol (A4C):   87.6 ml 40.30 ml/m LA Biplane Vol: 97.4 ml 44.81 ml/m  AORTIC VALVE AV Area (Vmax): 3.04 cm AV Vmax:        144.00 cm/s AV Peak Grad:   8.3 mmHg LVOT Vmax:       115.00 cm/s LVOT Vmean:     91.400 cm/s LVOT VTI:       0.170 m AI PHT:         450 msec  AORTA Ao Root diam: 3.70 cm Ao Asc diam:  4.00 cm MITRAL VALVE               TRICUSPID VALVE MV Area (PHT): 4.04 cm    TR Peak grad:   41.5  mmHg MV Decel Time: 188 msec    TR Vmax:        322.00 cm/s MR Peak grad: 67.7 mmHg MR Mean grad: 50.0 mmHg    SHUNTS MR Vmax:      411.50 cm/s  Systemic VTI:  0.17 m MR Vmean:     344.0 cm/s   Systemic Diam: 2.20 cm MV E velocity: 99.40 cm/s MV A velocity: 90.70 cm/s MV E/A ratio:  1.10 Skeet Latch MD Electronically signed by Skeet Latch MD Signature Date/Time: 10/05/2021/11:16:47 AM    Final    [place] EKG SITE RITE  Result Date: 10/05/2021 If Site Rite image not attached, placement could not be confirmed due to current cardiac rhythm.    Signed: Farrel Gordon, D.O.  Internal Medicine Resident, PGY-1 [facility]  Pager: [phone] 12:39 PM, 10/11/2021

## 2021-10-16 ENCOUNTER — Telehealth: Payer: Self-pay | Admitting: *Deleted

## 2021-10-16 ENCOUNTER — Encounter: Payer: Medicare (Managed Care) | Admitting: Internal Medicine

## 2021-10-16 NOTE — Telephone Encounter (Signed)
CALLED AND SPOKE WITH PATIENT REGARDING HIS MISSED APPOINTMENT/ HE IS TO CALL AND RESCHEDULE APPOINTMENT (HE FORGOT)/ PATIENT IS TO CALL THE MAIN NUMBER @ [phone]. ?

## 2021-10-19 ENCOUNTER — Ambulatory Visit (INDEPENDENT_AMBULATORY_CARE_PROVIDER_SITE_OTHER): Payer: Medicare (Managed Care) | Admitting: Internal Medicine

## 2021-10-19 ENCOUNTER — Other Ambulatory Visit (HOSPITAL_COMMUNITY): Payer: Self-pay

## 2021-10-19 VITALS — BP 105/73 | HR 79 | Temp 97.7°F | Ht 72.0 in | Wt 199.9 lb

## 2021-10-19 DIAGNOSIS — I5022 Chronic systolic (congestive) heart failure: Secondary | ICD-10-CM

## 2021-10-19 DIAGNOSIS — N179 Acute kidney failure, unspecified: Secondary | ICD-10-CM | POA: Diagnosis not present

## 2021-10-19 DIAGNOSIS — I10 Essential (primary) hypertension: Secondary | ICD-10-CM

## 2021-10-19 DIAGNOSIS — I5023 Acute on chronic systolic (congestive) heart failure: Secondary | ICD-10-CM

## 2021-10-19 DIAGNOSIS — I130 Hypertensive heart and chronic kidney disease with heart failure and stage 1 through stage 4 chronic kidney disease, or unspecified chronic kidney disease: Secondary | ICD-10-CM

## 2021-10-19 DIAGNOSIS — R042 Hemoptysis: Secondary | ICD-10-CM

## 2021-10-19 DIAGNOSIS — Z23 Encounter for immunization: Secondary | ICD-10-CM | POA: Diagnosis not present

## 2021-10-19 DIAGNOSIS — Z1211 Encounter for screening for malignant neoplasm of colon: Secondary | ICD-10-CM

## 2021-10-19 DIAGNOSIS — Z0000 Encounter for general adult medical examination without abnormal findings: Secondary | ICD-10-CM

## 2021-10-19 DIAGNOSIS — R062 Wheezing: Secondary | ICD-10-CM

## 2021-10-19 DIAGNOSIS — N1831 Chronic kidney disease, stage 3a: Secondary | ICD-10-CM

## 2021-10-19 MED ORDER — EMPAGLIFLOZIN 10 MG PO TABS: 10.0000 mg | ORAL_TABLET | Freq: Every day | ORAL | 6 refills | Status: DC

## 2021-10-19 MED ORDER — ALBUTEROL SULFATE (2.5 MG/3ML) 0.083% IN NEBU: 2.5000 mg | INHALATION_SOLUTION | Freq: Four times a day (QID) | RESPIRATORY_TRACT | 2 refills | Status: DC | PRN

## 2021-10-19 NOTE — Assessment & Plan Note (Signed)
Flu vaccine, pneumococcal, and tetanus shot given ?Referral for colonoscopy placed  ?

## 2021-10-19 NOTE — Assessment & Plan Note (Signed)
Patient presented with wheezing improved with DuoNeb treatment. No PFTs on file, but given active smoker, suspect COPD playing a role. Initial chest x-ray revealed stable cardiomegaly and mild left basilar scarring without active lung disease. CT was obtained d/t new hemoptysis with cough, showing new patchy infiltrates in the right middle lobe and left lower lobe suggesting multifocal pneumonia and small pleural effusion. Procalcitonin was negative, but due to concerns for CAP, he completed ceftriaxone 1 g daily for 5 days. Pulmonology was consulted, but deferred bronchoscopy to a later time d/t severity of HF and felt that the blood was old. Over the course of admission hemoptysis and cough improved, and he does not report any going cough, wheezing or hemoptysis today. Denies SHOB, productive cough/sputum production, fevers, chills, or any systemic symptoms.  ? ?Follow up with pulmonology on 3/29 for further evaluation of his hemoptysis and possible bronchoscopy.  ?

## 2021-10-19 NOTE — Progress Notes (Signed)
? ?CC: 1 week hospital follow up for acute on chronic biventricular systolic heart failure.  ? ?HPI: ? ?[name] is a 66 y.o. male with a PMHx stated below and presents today for stated above. Please see the Encounters tab for problem-based Assessment & Plan for additional details.  ? ?Past Medical History:  ?Diagnosis Date  ? Arthritis   ? "feels like it in my legs" (09/20/2014)  ? Borderline type 2 diabetes mellitus   ? patient denies  ? CHF (congestive heart failure) ([place])   ? Dysrhythmia   ? Hypertension   ? NSVT (nonsustained ventricular tachycardia) 09/06/2016  ? ? ?Current Outpatient Medications on File Prior to Visit  ?Medication Sig Dispense Refill  ? albuterol (PROVENTIL) (2.5 MG/3ML) 0.083% nebulizer solution Take 2.5 mg by nebulization every 6 (six) hours as needed for shortness of breath or wheezing.    ? albuterol (VENTOLIN HFA) 108 (90 Base) MCG/ACT inhaler INHALE 1 TO 2 PUFFS BY MOUTH EVERY 6 HOURS AS NEEDED FOR WHEEZING OR SHORTNESS OF BREATH (Patient not taking: Reported on 10/04/2021) 18 g 2  ? amiodarone (PACERONE) 200 MG tablet Take 1 tablet (200 mg total) by mouth 2 (two) times daily for 14 days, then take 1 tablet by mouth daily after. 44 tablet 0  ? amiodarone (PACERONE) 200 MG tablet Take 1 tablet (200 mg total) by mouth daily. 30 tablet 3  ? bismuth subsalicylate (PEPTO BISMOL) 262 MG/15ML suspension Take 30 mLs by mouth every 6 (six) hours as needed for indigestion.    ? Blood Pressure Monitor DEVI Please use to monitor blood pressure as directed.  ICD: I10 (hypertension) (Patient taking differently: Please use to monitor blood pressure as directed.  ICD: I10 (hypertension)) 1 each 0  ? digoxin (LANOXIN) 0.125 MG tablet TAKE 1 TABLET (0.125 MG TOTAL) BY MOUTH DAILY. 90 tablet 1  ? JARDIANCE 10 MG TABS tablet TAKE 1 TABLET (10 MG TOTAL) BY MOUTH DAILY. (Patient taking differently: Take 10 mg by mouth daily.) 30 tablet 6  ? losartan (COZAAR) 25 MG tablet Take 1/2 tablet (12.5 mg  total) by mouth daily. 30 tablet 1  ? rosuvastatin (CRESTOR) 5 MG tablet TAKE 1 TABLET (5 MG TOTAL) BY MOUTH DAILY. (Patient taking differently: Take 5 mg by mouth daily.) 90 tablet 3  ? spironolactone (ALDACTONE) 25 MG tablet Take 1 tablet (25 mg total) by mouth daily. 30 tablet 2  ? tamsulosin (FLOMAX) 0.4 MG CAPS capsule TAKE ONE CAPSULE BY MOUTH ONCE DAILY AFTER SUPPER (Patient taking differently: Take 0.4 mg by mouth daily.) 30 capsule 2  ? torsemide (DEMADEX) 20 MG tablet Take 2 tablets (40 mg total) by mouth daily. 60 tablet 2  ? ?No current facility-administered medications on file prior to visit.  ? ? ?Family History  ?Problem Relation Age of Onset  ? Stroke Mother 49  ? Heart disease Father 47  ? Colon cancer Neg Hx   ? Colon polyps Neg Hx   ? Esophageal cancer Neg Hx   ? Stomach cancer Neg Hx   ? Rectal cancer Neg Hx   ? ? ?Social History  ? ?Socioeconomic History  ? Marital status: Married  ?  Spouse name: Not on file  ? Number of children: Not on file  ? Years of education: Not on file  ? Highest education level: Not on file  ?Occupational History  ? Occupation: Engineer, manufacturing systems  ?  Comment: has not been able to work steadily for the last year or  two  ?Tobacco Use  ? Smoking status: Every Day  ?  Packs/day: 0.10  ?  Years: 48.00  ?  Pack years: 4.80  ?  Types: Cigarettes  ? Smokeless tobacco: Never  ? Tobacco comments:  ?  cutting back 2  per day  ?Vaping Use  ? Vaping Use: Never used  ?Substance and Sexual Activity  ? Alcohol use: Yes  ?  Alcohol/week: 1.0 standard drink  ?  Types: 1 Cans of beer per week  ?  Comment: occasionally  ? Drug use: Yes  ?  Types: Marijuana  ?  Comment: daily 4 times last 2 weeks ago as of 05/31/2019  ? Sexual activity: Yes  ?  Birth control/protection: None  ?Other Topics Concern  ? Not on file  ?Social History Narrative  ? Not on file  ? ?Social Determinants of Health  ? ?Financial Resource Strain: Low Risk   ? Difficulty of Paying Living Expenses: Not very hard  ?Food  Insecurity: No Food Insecurity  ? Worried About Charity fundraiser in the Last Year: Never true  ? Ran Out of Food in the Last Year: Never true  ?Transportation Needs: No Transportation Needs  ? Lack of Transportation (Medical): No  ? Lack of Transportation (Non-Medical): No  ?Physical Activity: Not on file  ?Stress: Not on file  ?Social Connections: Not on file  ?Intimate Partner Violence: Not on file  ? ? ?Review of Systems: ?ROS negative except for what is noted on the assessment and plan. ? ?Vitals:  ? 10/19/21 1040  ?BP: 105/73  ?Pulse: 79  ?Temp: 97.7 ?F (36.5 ?C)  ?TempSrc: Oral  ?SpO2: 100%  ?Weight: 199 lb 14.4 oz (90.7 kg)  ?Height: 6' (1.829 m)  ? ? ? ?Physical Exam: ?Constitutional: alert, well-appearing, in NAD ?HENT: normocephalic, atraumatic, mucous membranes moist ?Neck: no JVD ?Eyes: conjunctiva non-erythematous, EOMI ?Cardiovascular: RRR, no m/r/g, non-edematous bilateral LE/ euvolemic on exam ?Pulmonary/Chest: normal work of breathing on RA, mild wheezing in bilateral lung fields.  ?Abdominal: soft, non-tender to palpation, non-distended ?MSK: normal bulk and tone  ?Neurological: A&O x 3 and follows commands  ?Skin: warm and dry  ?Psych: normal behavior, normal affect  ? ?Assessment & Plan:  ? ?See Encounters Tab for problem based charting. ? ?Patient discussed with Dr. Philipp Ovens ? ?Lajean Manes, MD  ?Internal Medicine Resident, PGY-1 ?[facility]  ?

## 2021-10-19 NOTE — Assessment & Plan Note (Addendum)
Admitted from 2/23-3/2 for acute HFrEF exacerbation. Patient presented with dyspnea on exertion, orthopnea, lower extremity edema,?hypervolemia on exam, and elevated BNP of over 3000. Nonspecific ST wave changes on EKG. ?Troponins relatively flat 40, 30. Diuresed with IV Lasix. Echocardiogram with basal to mid inferoseptal and inferior akinesis, LV EF 20 to 25%,?severely decreased function,?global hypokinesis,?internal cavity size was moderately to severely dilated,?mild concentric left ventricular hypertrophy,?consistent with Grade II diastolic dysfunction; RV?systolic function is mildly reduced,?moderately elevated pulmonary artery systolic pressure; severely dilated LA and RA. Wall motion abnormalities were new from last echo 11/2020. Suspect this is from ongoing use of crack cocaine. Cardiology was consulted and started the patient on milrinone 0.25 mcg/kg/min 02/25, weaned 02/27, and discontinued 02/28. He was started on spironolactone 02/27. He underwent [facility] 03/01 which showed 1st RPL lesion 80% stenosed, mid LAD lesion 30% stenosed, stable coronaries with 80% stenosis in a relatively small PLV branch, mildly elevated PCWP and LVEDP, normal RA pressure, cardiac index decreased at 1.9. Patient was transitioned to a new regimen including: amiodarone 200 mg BID for two weeks then 200 mg daily, losartan 12.5 daily, torsemide 40 daily, spironolactone 25 daily, Jardiance 10 daily, digoxin 0.125 mg daily, Crestor 5 daily. Patient will need close follow-up with CHF clinic and paramedicine. Delene Loll was not restarted due to low BP, and B blockers not given due to low output status. Plan for close follow up with Advanced HF team.  ?He reports no ongoing sxs of DOE, SHOB,chest pain, and leg swelling. He reports good medication compliance without any adverse effects. Adhering to fluid restrictions as well- congratulated him on this. He has no acute concerns today, and reports to be doing very well. BP today is soft-  105/73, will continue to hold off on entresto. Pt is to follow up with the AHF team on 3/13 at 330. He is aware of this and understands the importance of this. Counseled on the importance of cocaine cessation at this time. ?

## 2021-10-19 NOTE — Progress Notes (Incomplete)
?Advanced Heart Failure Clinic Note  ? ?Primary Care: Harvie Heck, MD ?Primary Cardiologist: Dr. Aundra Dubin  ? ?HPI: ?Edward Mullen is a 66 y.o. male with history of HTN, DM, chronic systolic CHF, and polysubstance abuse.  ? ?Admitted 08/23/16 with acute systolic CHF in setting of substance abuse. Echo was done, showing EF 15-20%.  Diuresed with IV lasix and meds adjusted as tolerated.  UDS + for cocaine on admission. Cardiac cath showed coronary disease but not significant enough to explain cardiomyopathy. Down 23 lbs from admission weight with diuresis.  Discharge weight 193 lbs. ? ?He was positive for cocaine again in 2/18.   ? ?He was admitted with syncope in 5/18 after using cocaine.  He was noted to have NSVT on telemetry.  Syncope suspected due to VT, no ICD given active substance abuse but had Lifevest placed. EF 50% on 8/18 echo, so Lifevest subsequently removed. ? ?He started using cocaine again and stopped his cardiac medications. In 4/22, he was admitted with acute on chronic systolic CHF.  Frequent NSVT was noted and amiodarone was started.  He had echo showing EF 20-25% with normal RV.  LHC showed 80% PLV stenosis, managed medically.  ? ?He presents today for followup of CHF.  He says that he is not using any cocaine.  He still smokes 1-2 cigarettes/day.  No ETOH.  Walks with cane.  Short of breath walking up stairs or walking about 100 yards.  No chest pain.  No lightheadedness.  No palpitations.  ? ?ECG (personally reviewed, 4/22): NSR, LBBB 148 msec.  ? ?Labs (1/18): Digoxin 0.4, BNP 961 ?Labs (2/18): K 4, creatinine 1.38 ?Labs (3/18): K 4.4, creatinine 1.85, digoxin 0.4 ?Labs (5/18): hgb 12.6, K 4.3, creatinine 1.3, digoxin 0.5 ?Labs (6/18): TSH normal, K 4.4, creatinine 2.04, AST 42, ALT 37 ?Labs (8/18): K 4.8, creatinine 1.65 ?Labs (11/18): K 4.1, creatinine 1.77 ?Labs (10/20): K 3.8, creatinine 1.33, hgb 14.9 ?Labs (4/22): digoxin 0.5, K 4.4, creatinine 1.5 ? ?ROS: All systems reviewed and  negative except as per HPI.  ? ?Social History: Lives in [place] with daughter. Prior heavy ETOH, now quit.  Quit smoking 10/20. Has quit using cocaine.  Active marijuana.  ? ?Family History  ?Problem Relation Age of Onset  ? Stroke Mother 87  ? Heart disease Father 80  ? Colon cancer Neg Hx   ? Colon polyps Neg Hx   ? Esophageal cancer Neg Hx   ? Stomach cancer Neg Hx   ? Rectal cancer Neg Hx   ? ?Past Medical History: ?1. HTN  ?2. Type II diabetes ?3. Cocaine abuse ?4. Active smoking, suspect COPD.  ?5. Cardiomyopathy: Nonischemic.  ?- Echo (1/18) with EF 15-20%, moderate LVH, moderate AI, moderate to severe MR, normal RV size with mildly decreased systolic function, PASP 44 mmHg.  ?- LHC/RHC (1/18): 80% stenosis in PLV branch.  Mean RA 5, PA 47/20 mean 32, mean PCWP 22, CI 2.01.  ?- Echo (8/18): EF 50% with moderate LVH, diffuse hypokinesis, normal RV size and systolic function, mild AI, trivial MR.  ?- Echo (4/22): EF 20-25%, severe LV dilation, normal RV. ?6. CAD: LHC (1/18) with 80% stenosis in a branch of the PLV (not enough CAD to explain cardiomyopathy).  ?- LHC (4/22): PLV 80%, 25% mLAD.  ?7. Mitral regurgitation: Moderate to severe on 1/18 echo, probably functional.  Trivial MR on 8/18 echo.  ?8. CKD: Stage 3.  ?9. Syncope: 5/18 in setting of cocaine use, concern for VT.  ?  10. Lower extremity arterial dopplers (7/18): No significant PAD.  ?11. Sciatica ?12. NSVT ?13. CKD stage 3 ? ? ?Current Outpatient Medications  ?Medication Sig Dispense Refill  ? albuterol (PROVENTIL) (2.5 MG/3ML) 0.083% nebulizer solution Take 2.5 mg by nebulization every 6 (six) hours as needed for shortness of breath or wheezing.    ? albuterol (VENTOLIN HFA) 108 (90 Base) MCG/ACT inhaler INHALE 1 TO 2 PUFFS BY MOUTH EVERY 6 HOURS AS NEEDED FOR WHEEZING OR SHORTNESS OF BREATH (Patient not taking: Reported on 10/04/2021) 18 g 2  ? amiodarone (PACERONE) 200 MG tablet Take 1 tablet (200 mg total) by mouth 2 (two) times daily for 14  days, then take 1 tablet by mouth daily after. 44 tablet 0  ? amiodarone (PACERONE) 200 MG tablet Take 1 tablet (200 mg total) by mouth daily. 30 tablet 3  ? bismuth subsalicylate (PEPTO BISMOL) 262 MG/15ML suspension Take 30 mLs by mouth every 6 (six) hours as needed for indigestion.    ? Blood Pressure Monitor DEVI Please use to monitor blood pressure as directed.  ICD: I10 (hypertension) (Patient taking differently: Please use to monitor blood pressure as directed.  ICD: I10 (hypertension)) 1 each 0  ? digoxin (LANOXIN) 0.125 MG tablet TAKE 1 TABLET (0.125 MG TOTAL) BY MOUTH DAILY. 90 tablet 1  ? JARDIANCE 10 MG TABS tablet TAKE 1 TABLET (10 MG TOTAL) BY MOUTH DAILY. (Patient taking differently: Take 10 mg by mouth daily.) 30 tablet 6  ? losartan (COZAAR) 25 MG tablet Take 1/2 tablet (12.5 mg total) by mouth daily. 30 tablet 1  ? rosuvastatin (CRESTOR) 5 MG tablet TAKE 1 TABLET (5 MG TOTAL) BY MOUTH DAILY. (Patient taking differently: Take 5 mg by mouth daily.) 90 tablet 3  ? spironolactone (ALDACTONE) 25 MG tablet Take 1 tablet (25 mg total) by mouth daily. 30 tablet 2  ? tamsulosin (FLOMAX) 0.4 MG CAPS capsule TAKE ONE CAPSULE BY MOUTH ONCE DAILY AFTER SUPPER (Patient taking differently: Take 0.4 mg by mouth daily.) 30 capsule 2  ? torsemide (DEMADEX) 20 MG tablet Take 2 tablets (40 mg total) by mouth daily. 60 tablet 2  ? ?No current facility-administered medications for this visit.  ? ? ?No Known Allergies ? ? ? ?There were no vitals filed for this visit. ? ?Wt Readings from Last 3 Encounters:  ?10/11/21 87.9 kg (193 lb 11.2 oz)  ?10/01/21 85.7 kg (188 lb 15 oz)  ?12/26/20 85.7 kg (189 lb)  ?  ? ?PHYSICAL EXAM: ?General: NAD ?Neck: JVP 8-9 cm, no thyromegaly or thyroid nodule.  ?Lungs: Rhonchi right base.  ?CV: Nondisplaced PMI.  Heart regular S1/S2, no S3/S4, no murmur.  1+ edema 1/3 up lower legs.  No carotid bruit.  Normal pedal pulses.  ?Abdomen: Soft, nontender, no hepatosplenomegaly, no distention.   ?Skin: Intact without lesions or rashes.  ?Neurologic: Alert and oriented x 3.  ?Psych: Normal affect. ?Extremities: No clubbing or cyanosis.  ?HEENT: Normal.  ? ?ASSESSMENT & PLAN: ? ?1. Chronic systolic CHF: EF 63-14% with moderate to severe MR on echo 1/18.  Nonischemic cardiomyopathy. HTN vs cocaine use vs viral vs ETOH. HIV negative, SPEP with no M-spike.  Patient does have CAD but not enough to explain cardiomyopathy (last cath in 4/22 with 80% PLV stenosis).  Echo in 8/18 showed EF up to 50%. However, he stopped his meds and started cocaine again, echo in 4/22 with EF back down to 20-25% => suspect cocaine abuse may be the primary etiology of cardiomyopathy.  He is now back on his meds and off cocaine.  He has a LBBB.  NYHA class II-III symptoms, mild volume overload.  ?- Start Lasix 20 mg daily. BMET today and in 10 days.  ?- Continue eplerenone 25 mg daily (breast tenderness with spironolactone).    ?- Continue digoxin, check level today.  ?- Continue empagliflozin 10 mg daily.  ?- Start Entresto 24/26 bid.  ?- I urged him to stay off cocaine.  ?- He has a wide LBBB, would like benefit from CRT if EF remains low.  However, EF has improved in the past off cocaine. Would repeat echo after med titration and off cocaine.  ?2. NSVT: Runs during last hospitalization.  He is now on amiodarone.  ?- Decrease amiodarone to 200 mg daily.  Check LFTs/TSH.  Needs regular eye exam.  ?3. Cocaine abuse: He has quit, congratulated him.  ?4. CKD stage 3: BMET today.  ?5. Smoking: Needs to quit completely.  ?6. CAD: 80% stenosis PLV on cath in 4/22.  No ischemic chest pain.   ?- Continue ASA 81 daily and Crestor 5 mg daily.   ?- Check lipids.  ? ?Followup 3 wks with APP to reassess volume.  ? ?Rafael Bihari, FNP ?10/19/21  ? ?

## 2021-10-19 NOTE — Patient Instructions (Signed)
Please continue taking you medications  ?I will call you with lab results  ?I have put in a colonoscopy referral  ?PLEASE go to your cardiology appointment on  3/13 and your pulmonology appointment on 3/29.  ?Call us if you notice worsening shortness of breath, dyspnea on exertion, and increased leg swelling.  ?

## 2021-10-19 NOTE — Assessment & Plan Note (Addendum)
Had a slight AKI with Cr 1.7 (baseline 1.5-1.6) on previous hospital admission that resolved with IVF. Reports to be eating and drinking well, but also adhering to fluid restriction. ? ?BMP today ? ?Addendum: ?Cr increased at 2.25. Will encourage oral hydration, ask to hold Losartan, and follow up in clinic for eval and repeat BMP at end of this weak.  ?

## 2021-10-19 NOTE — Assessment & Plan Note (Signed)
BP soft 105/73 today. Entresto held at hospital DC d/t soft pressures. Will hold off on re-starting today.  ? ?Continue with losartan, torsemide, and spiro ?Follow up with AHF team on 3/13.   ?

## 2021-10-20 LAB — BMP8+ANION GAP
Anion Gap: 20 mmol/L — ABNORMAL HIGH (ref 10.0–18.0)
BUN/Creatinine Ratio: 21 (ref 10–24)
BUN: 47 mg/dL — ABNORMAL HIGH (ref 8–27)
CO2: 20 mmol/L (ref 20–29)
Calcium: 9.2 mg/dL (ref 8.6–10.2)
Chloride: 100 mmol/L (ref 96–106)
Creatinine, Ser: 2.25 mg/dL — ABNORMAL HIGH (ref 0.76–1.27)
Glucose: 81 mg/dL (ref 70–99)
Potassium: 5.4 mmol/L — ABNORMAL HIGH (ref 3.5–5.2)
Sodium: 140 mmol/L (ref 134–144)
eGFR: 31 mL/min/{1.73_m2} — ABNORMAL LOW (ref >59)

## 2021-10-22 ENCOUNTER — Encounter (HOSPITAL_COMMUNITY): Payer: Medicare (Managed Care)

## 2021-10-23 NOTE — Progress Notes (Signed)
Internal Medicine Clinic Attending  Case discussed with Dr. Patel  At the time of the visit.  We reviewed the resident's history and exam and pertinent patient test results.  I agree with the assessment, diagnosis, and plan of care documented in the resident's note.  

## 2021-10-26 ENCOUNTER — Ambulatory Visit (INDEPENDENT_AMBULATORY_CARE_PROVIDER_SITE_OTHER): Payer: Medicare (Managed Care) | Admitting: Internal Medicine

## 2021-10-26 ENCOUNTER — Encounter: Payer: Self-pay | Admitting: Internal Medicine

## 2021-10-26 ENCOUNTER — Other Ambulatory Visit: Payer: Self-pay

## 2021-10-26 VITALS — BP 82/54 | HR 78 | Temp 97.6°F | Ht 72.0 in | Wt 200.0 lb

## 2021-10-26 DIAGNOSIS — N1831 Chronic kidney disease, stage 3a: Secondary | ICD-10-CM | POA: Diagnosis not present

## 2021-10-26 DIAGNOSIS — N179 Acute kidney failure, unspecified: Secondary | ICD-10-CM | POA: Diagnosis not present

## 2021-10-26 DIAGNOSIS — E878 Other disorders of electrolyte and fluid balance, not elsewhere classified: Secondary | ICD-10-CM

## 2021-10-26 NOTE — Progress Notes (Signed)
? ?CC: 1 week follow up for AKI lab work  ? ?HPI: ? ?[name] is a 66 y.o. male with a PMHx stated below and presents today for stated above. Please see the Encounters tab for problem-based Assessment & Plan for additional details.  ? ?Past Medical History:  ?Diagnosis Date  ? Arthritis   ? "feels like it in my legs" (09/20/2014)  ? Borderline type 2 diabetes mellitus   ? patient denies  ? CHF (congestive heart failure) ([place])   ? Dysrhythmia   ? Hypertension   ? NSVT (nonsustained ventricular tachycardia) 09/06/2016  ? ? ?Current Outpatient Medications on File Prior to Visit  ?Medication Sig Dispense Refill  ? albuterol (PROVENTIL) (2.5 MG/3ML) 0.083% nebulizer solution Take 3 mLs (2.5 mg total) by nebulization every 6 (six) hours as needed for shortness of breath or wheezing. 75 mL 2  ? albuterol (VENTOLIN HFA) 108 (90 Base) MCG/ACT inhaler INHALE 1 TO 2 PUFFS BY MOUTH EVERY 6 HOURS AS NEEDED FOR WHEEZING OR SHORTNESS OF BREATH (Patient not taking: Reported on 10/04/2021) 18 g 2  ? amiodarone (PACERONE) 200 MG tablet Take 1 tablet (200 mg total) by mouth 2 (two) times daily for 14 days, then take 1 tablet by mouth daily after. 44 tablet 0  ? amiodarone (PACERONE) 200 MG tablet Take 1 tablet (200 mg total) by mouth daily. 30 tablet 3  ? bismuth subsalicylate (PEPTO BISMOL) 262 MG/15ML suspension Take 30 mLs by mouth every 6 (six) hours as needed for indigestion.    ? Blood Pressure Monitor DEVI Please use to monitor blood pressure as directed.  ICD: I10 (hypertension) (Patient taking differently: Please use to monitor blood pressure as directed.  ICD: I10 (hypertension)) 1 each 0  ? digoxin (LANOXIN) 0.125 MG tablet TAKE 1 TABLET (0.125 MG TOTAL) BY MOUTH DAILY. 90 tablet 1  ? empagliflozin (JARDIANCE) 10 MG TABS tablet Take 1 tablet (10 mg total) by mouth daily. 30 tablet 6  ? losartan (COZAAR) 25 MG tablet Take 1/2 tablet (12.5 mg total) by mouth daily. 30 tablet 1  ? rosuvastatin (CRESTOR) 5 MG tablet  TAKE 1 TABLET (5 MG TOTAL) BY MOUTH DAILY. (Patient taking differently: Take 5 mg by mouth daily.) 90 tablet 3  ? spironolactone (ALDACTONE) 25 MG tablet Take 1 tablet (25 mg total) by mouth daily. 30 tablet 2  ? tamsulosin (FLOMAX) 0.4 MG CAPS capsule TAKE ONE CAPSULE BY MOUTH ONCE DAILY AFTER SUPPER (Patient taking differently: Take 0.4 mg by mouth daily.) 30 capsule 2  ? torsemide (DEMADEX) 20 MG tablet Take 2 tablets (40 mg total) by mouth daily. 60 tablet 2  ? ?No current facility-administered medications on file prior to visit.  ? ? ?Family History  ?Problem Relation Age of Onset  ? Stroke Mother 52  ? Heart disease Father 85  ? Colon cancer Neg Hx   ? Colon polyps Neg Hx   ? Esophageal cancer Neg Hx   ? Stomach cancer Neg Hx   ? Rectal cancer Neg Hx   ? ? ?Social History  ? ?Socioeconomic History  ? Marital status: Married  ?  Spouse name: Not on file  ? Number of children: Not on file  ? Years of education: Not on file  ? Highest education level: Not on file  ?Occupational History  ? Occupation: Engineer, manufacturing systems  ?  Comment: has not been able to work steadily for the last year or two  ?Tobacco Use  ? Smoking status: Every  Day  ?  Packs/day: 0.10  ?  Years: 48.00  ?  Pack years: 4.80  ?  Types: Cigarettes  ? Smokeless tobacco: Never  ? Tobacco comments:  ?  cutting back 2  per day  ?Vaping Use  ? Vaping Use: Never used  ?Substance and Sexual Activity  ? Alcohol use: Yes  ?  Alcohol/week: 1.0 standard drink  ?  Types: 1 Cans of beer per week  ?  Comment: occasionally  ? Drug use: Yes  ?  Types: Marijuana  ?  Comment: daily 4 times last 2 weeks ago as of 05/31/2019  ? Sexual activity: Yes  ?  Birth control/protection: None  ?Other Topics Concern  ? Not on file  ?Social History Narrative  ? Not on file  ? ?Social Determinants of Health  ? ?Financial Resource Strain: Low Risk   ? Difficulty of Paying Living Expenses: Not very hard  ?Food Insecurity: No Food Insecurity  ? Worried About Charity fundraiser in the  Last Year: Never true  ? Ran Out of Food in the Last Year: Never true  ?Transportation Needs: No Transportation Needs  ? Lack of Transportation (Medical): No  ? Lack of Transportation (Non-Medical): No  ?Physical Activity: Not on file  ?Stress: Not on file  ?Social Connections: Not on file  ?Intimate Partner Violence: Not on file  ? ? ?Review of Systems: ?ROS negative except for what is noted on the assessment and plan. ? ?Vitals:  ? 10/26/21 0921 10/26/21 0930  ?BP: 95/61 (!) 82/54  ?Pulse: 78   ?Temp: 97.6 ?F (36.4 ?C)   ?TempSrc: Oral   ?SpO2: 99%   ?Weight: 200 lb (90.7 kg)   ?Height: 6' (1.829 m)   ? ? ? ?Physical Exam: ?Constitutional: alert, well-appearing, in NAD ?HENT: normocephalic, atraumatic, mucous membranes moist ?Neck: no JVD ?Eyes: conjunctiva non-erythematous, EOMI ?Cardiovascular: RRR, no m/r/g, non-edematous bilateral LE/ euvolemic on exam ?Pulmonary/Chest: normal work of breathing on RA, mild wheezing in bilateral lung fields.  ?Abdominal: soft, non-tender to palpation, non-distended ?MSK: normal bulk and tone  ?Neurological: A&O x 3 and follows commands  ?Skin: warm and dry  ?Psych: normal behavior, normal affect  ? ?Assessment & Plan:  ? ?See Encounters Tab for problem based charting. ? ?Patient discussed with Dr. Saverio Danker ? ?Lajean Manes, MD  ?Internal Medicine Resident, PGY-1 ?[facility]  ?

## 2021-10-26 NOTE — Patient Instructions (Addendum)
We had you come back for an abnormal lab result. It showed that your kidneys were dry. ? ?We will repeat this lab work today and see what it shows.  ? ?Please continue stop taking your losartan  ? ? ?Follow up with your heart doctor at the advanced heart failure clinic on 4/4 at 9 am:  ?Address: ?[address] ?[place],  [place]  [address] ?

## 2021-10-26 NOTE — Assessment & Plan Note (Addendum)
1 week follow up for AKI. Hospital follow up BMP showing Cr of 2.25, up from his baseline of 1.7, in the setting of Losartan, Jardiance, spiro, and torsemide use. He was instructed to hold his Losartan and to follow up in 1 week for repeat BMET. He reports eating and drinking well, and has no trouble with urination. States that he is mostly sure that he has been holding his Losartan. Given BP 82/54 and 95/65, and repeat 107/70, suspect that this could be a pre-renal etiology. Orthostatics negative with maps 70's. He is completely asymptomatic at this time. Soft pressures have been ongoing since hospital admission, requiring holding Entresto. He no showed his HF appointment on 3/13; called the HF office and scheduled him for an appointment at 4/4 at 9 am.  ? ?F/u on BMP  ?Continue holding Losartan ?Continue with torsemide 40 daily, spironolactone 25 daily, and Jardiance 10 daily ?Advised to call clinic or go to the ED for lightheadedness, SHOB, orthopnea, or chest pain.  ?F/u with HF team on 4/4 at 9 am  ? ?Addendum: ?Up-trending Cr from 1.6 on d/c -> 2.25 at hospital f/u -> 2.5 during most recent visit. Stable GFR since out of the hospital, though slightly lower than at discharge. Suspect this is 2/2 soft pressures in setting of GDMT. Lisinopril is currently being held, and will not instruct pt to start holding spironolactone 25 as well. Will follow up in 1 week for repeat lab work.   ?

## 2021-10-27 LAB — BMP8+ANION GAP
Anion Gap: 18 mmol/L (ref 10.0–18.0)
BUN/Creatinine Ratio: 17 (ref 10–24)
BUN: 44 mg/dL — ABNORMAL HIGH (ref 8–27)
CO2: 21 mmol/L (ref 20–29)
Calcium: 9.1 mg/dL (ref 8.6–10.2)
Chloride: 104 mmol/L (ref 96–106)
Creatinine, Ser: 2.57 mg/dL — ABNORMAL HIGH (ref 0.76–1.27)
Glucose: 81 mg/dL (ref 70–99)
Potassium: 4.6 mmol/L (ref 3.5–5.2)
Sodium: 143 mmol/L (ref 134–144)
eGFR: 27 mL/min/{1.73_m2} — ABNORMAL LOW (ref >59)

## 2021-10-29 ENCOUNTER — Telehealth (HOSPITAL_COMMUNITY): Payer: Self-pay

## 2021-10-29 NOTE — Telephone Encounter (Signed)
Spoke to Edward Mullen and confirmed paramedicine home visit for Thursday at 0900. Call complete.  ?

## 2021-10-30 NOTE — Progress Notes (Signed)
Internal Medicine Clinic Attending  Case discussed with Dr. Patel  At the time of the visit.  We reviewed the resident's history and exam and pertinent patient test results.  I agree with the assessment, diagnosis, and plan of care documented in the resident's note.  

## 2021-10-31 ENCOUNTER — Telehealth: Payer: Self-pay | Admitting: Internal Medicine

## 2021-10-31 NOTE — Telephone Encounter (Signed)
Please refer to message below.  Unable to contact patient via telephone.  Tried both telephone numbers on file and no answer or voicemail.  Forwarding information back to Dr. Posey Pronto to make him aware. ?

## 2021-10-31 NOTE — Telephone Encounter (Signed)
-----   Message from [facility], MD sent at 10/30/2021  9:48 AM EDT ----- ?Please schedule this pt for appt on next Tuesday with me.  ? ?Monik Patel  ? ?

## 2021-11-01 ENCOUNTER — Other Ambulatory Visit (HOSPITAL_COMMUNITY): Payer: Self-pay

## 2021-11-01 ENCOUNTER — Telehealth (HOSPITAL_COMMUNITY): Payer: Self-pay

## 2021-11-01 NOTE — Telephone Encounter (Signed)
Heather from Paramedicine called to clarify dose of Losartan. ?

## 2021-11-01 NOTE — Progress Notes (Signed)
Paramedicine Encounter ? ? ? Patient ID: Edward Mullen, male    DOB: [birth year], 66 y.o.   MRN: [id number] ? ? ?Arrived for home paramedicine visit where Edward Mullen reports feeling good with no complaints today. He denied chest pain, shortness of breath, trouble sleeping. He has all medications in his home today where we reviewed same.  ? ?Per notes from Dr. Posey Pronto he is to hold spironolactone. I confirmed medication doses and filled pill box for one week.  ? ?Vitals and assessment obtained and confirmed.  ? ?Appointments reviewed and confirmed. Home visit planned for next Thurs. ? ?Refills: ?Torsemide ?Albuterol  ? ? ?Patient Care Team: ?Harvie Heck, MD as PCP - General ?Drema Pry as Social Worker ? ?Patient Active Problem List  ? Diagnosis Date Noted  ? Type 2 diabetes mellitus with stage 3b chronic kidney disease, without long-term current use of insulin ([place]) 10/05/2021  ? Hemoptysis   ? Acute exacerbation of CHF (congestive heart failure) ([place]) 10/04/2021  ? Urinary retention   ? Scrotal mass 08/01/2020  ? Nocturia 08/01/2020  ? Lumbar spinal stenosis 06/08/2019  ? Healthcare maintenance 05/11/2019  ? Housing problems 05/11/2019  ? Difficulty urinating 04/07/2018  ? Colon cancer screening 04/07/2018  ? Anemia 12/19/2016  ? CKD (chronic kidney disease) stage 3, GFR 30-59 ml/min ([facility]) 12/13/2016  ? Acute on chronic systolic heart failure ([place]) 09/06/2016  ? Non-ischemic cardiomyopathy ([place]) 08/27/2016  ? 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 Mitral regurgitation 08/27/2016  ? Polysubstance abuse ([place])   ? Tobacco abuse 09/19/2014  ? HTN (hypertension) 03/28/2011  ? Bilateral lower extremity pain 03/28/2011  ? ? ?Current Outpatient Medications:  ?  albuterol (PROVENTIL) (2.5 MG/3ML) 0.083% nebulizer solution, Take 3 mLs (2.5 mg total) by nebulization every 6 (six) hours as needed for shortness of breath or wheezing., Disp: 75 mL, Rfl: 2 ?  albuterol (VENTOLIN HFA) 108 (90 Base)  MCG/ACT inhaler, INHALE 1 TO 2 PUFFS BY MOUTH EVERY 6 HOURS AS NEEDED FOR WHEEZING OR SHORTNESS OF BREATH (Patient not taking: Reported on 10/04/2021), Disp: 18 g, Rfl: 2 ?  amiodarone (PACERONE) 200 MG tablet, Take 1 tablet (200 mg total) by mouth 2 (two) times daily for 14 days, then take 1 tablet by mouth daily after., Disp: 44 tablet, Rfl: 0 ?  amiodarone (PACERONE) 200 MG tablet, Take 1 tablet (200 mg total) by mouth daily., Disp: 30 tablet, Rfl: 3 ?  bismuth subsalicylate (PEPTO BISMOL) 262 MG/15ML suspension, Take 30 mLs by mouth every 6 (six) hours as needed for indigestion., Disp: , Rfl:  ?  Blood Pressure Monitor DEVI, Please use to monitor blood pressure as directed.  ICD: I10 (hypertension) (Patient taking differently: Please use to monitor blood pressure as directed.  ICD: I10 (hypertension)), Disp: 1 each, Rfl: 0 ?  digoxin (LANOXIN) 0.125 MG tablet, TAKE 1 TABLET (0.125 MG TOTAL) BY MOUTH DAILY., Disp: 90 tablet, Rfl: 1 ?  empagliflozin (JARDIANCE) 10 MG TABS tablet, Take 1 tablet (10 mg total) by mouth daily., Disp: 30 tablet, Rfl: 6 ?  losartan (COZAAR) 25 MG tablet, Take 1/2 tablet (12.5 mg total) by mouth daily., Disp: 30 tablet, Rfl: 1 ?  rosuvastatin (CRESTOR) 5 MG tablet, TAKE 1 TABLET (5 MG TOTAL) BY MOUTH DAILY. (Patient taking differently: Take 5 mg by mouth daily.), Disp: 90 tablet, Rfl: 3 ?  spironolactone (ALDACTONE) 25 MG tablet, Take 1 tablet (25 mg total) by mouth daily., Disp: 30 tablet, Rfl: 2 ?  tamsulosin (FLOMAX) 0.4  MG CAPS capsule, TAKE ONE CAPSULE BY MOUTH ONCE DAILY AFTER SUPPER (Patient taking differently: Take 0.4 mg by mouth daily.), Disp: 30 capsule, Rfl: 2 ?  torsemide (DEMADEX) 20 MG tablet, Take 2 tablets (40 mg total) by mouth daily., Disp: 60 tablet, Rfl: 2 ?No Known Allergies ? ? ?Social History  ? ?Socioeconomic History  ? Marital status: Married  ?  Spouse name: Not on file  ? Number of children: Not on file  ? Years of education: Not on file  ? Highest education  level: Not on file  ?Occupational History  ? Occupation: Engineer, manufacturing systems  ?  Comment: has not been able to work steadily for the last year or two  ?Tobacco Use  ? Smoking status: Every Day  ?  Packs/day: 0.10  ?  Years: 48.00  ?  Pack years: 4.80  ?  Types: Cigarettes  ? Smokeless tobacco: Never  ? Tobacco comments:  ?  cutting back 2  per day  ?Vaping Use  ? Vaping Use: Never used  ?Substance and Sexual Activity  ? Alcohol use: Yes  ?  Alcohol/week: 1.0 standard drink  ?  Types: 1 Cans of beer per week  ?  Comment: occasionally  ? Drug use: Yes  ?  Types: Marijuana  ?  Comment: daily 4 times last 2 weeks ago as of 05/31/2019  ? Sexual activity: Yes  ?  Birth control/protection: None  ?Other Topics Concern  ? Not on file  ?Social History Narrative  ? Not on file  ? ?Social Determinants of Health  ? ?Financial Resource Strain: Low Risk   ? Difficulty of Paying Living Expenses: Not very hard  ?Food Insecurity: No Food Insecurity  ? Worried About Charity fundraiser in the Last Year: Never true  ? Ran Out of Food in the Last Year: Never true  ?Transportation Needs: No Transportation Needs  ? Lack of Transportation (Medical): No  ? Lack of Transportation (Non-Medical): No  ?Physical Activity: Not on file  ?Stress: Not on file  ?Social Connections: Not on file  ?Intimate Partner Violence: Not on file  ? ? ?Physical Exam ?Vitals reviewed.  ?Constitutional:   ?   Appearance: Normal appearance. He is normal weight.  ?HENT:  ?   Head: Normocephalic.  ?   Nose: Nose normal.  ?   Mouth/Throat:  ?   Mouth: Mucous membranes are moist.  ?   Pharynx: Oropharynx is clear.  ?Eyes:  ?   Conjunctiva/sclera: Conjunctivae normal.  ?   Pupils: Pupils are equal, round, and reactive to light.  ?Cardiovascular:  ?   Rate and Rhythm: Normal rate and regular rhythm.  ?   Pulses: Normal pulses.  ?   Heart sounds: Normal heart sounds.  ?Pulmonary:  ?   Effort: Pulmonary effort is normal.  ?   Breath sounds: Normal breath sounds.  ?Abdominal:  ?    General: Abdomen is flat.  ?   Palpations: Abdomen is soft.  ?Musculoskeletal:     ?   General: No swelling. Normal range of motion.  ?   Cervical back: Normal range of motion.  ?   Right lower leg: No edema.  ?   Left lower leg: No edema.  ?Skin: ?   General: Skin is warm and dry.  ?   Capillary Refill: Capillary refill takes less than 2 seconds.  ?Neurological:  ?   General: No focal deficit present.  ?   Mental Status: He is alert.  Mental status is at baseline.  ?Psychiatric:     ?   Mood and Affect: Mood normal.  ? ? ? ? ? ? ?Future Appointments  ?Date Time Provider [place]  ?11/07/2021  9:15 AM Lajean Manes, MD [facility]  ?11/07/2021  2:00 PM Hunsucker, Bonna Gains, MD [facility] None  ?11/13/2021  9:00 AM [facility] PA/NP [facility] None  ?11/20/2021 10:15 AM Harvie Heck, MD [facility]  ? ? ? ?ACTION: ?Home visit completed ? ? ? ? ? ? ?

## 2021-11-02 ENCOUNTER — Other Ambulatory Visit (HOSPITAL_COMMUNITY): Payer: Self-pay

## 2021-11-02 ENCOUNTER — Other Ambulatory Visit (HOSPITAL_COMMUNITY): Payer: Self-pay | Admitting: *Deleted

## 2021-11-02 MED ORDER — TORSEMIDE 20 MG PO TABS
40.0000 mg | ORAL_TABLET | Freq: Every day | ORAL | 2 refills | Status: DC
  Filled 2021-11-02: qty 60, 30d supply, fill #0

## 2021-11-02 MED ORDER — TORSEMIDE 20 MG PO TABS: 40.0000 mg | ORAL_TABLET | Freq: Every day | ORAL | 2 refills | Status: DC

## 2021-11-07 ENCOUNTER — Encounter: Payer: Self-pay | Admitting: Internal Medicine

## 2021-11-07 ENCOUNTER — Other Ambulatory Visit: Payer: Self-pay

## 2021-11-07 ENCOUNTER — Ambulatory Visit (INDEPENDENT_AMBULATORY_CARE_PROVIDER_SITE_OTHER): Payer: Medicare (Managed Care) | Admitting: Pulmonary Disease

## 2021-11-07 ENCOUNTER — Ambulatory Visit (INDEPENDENT_AMBULATORY_CARE_PROVIDER_SITE_OTHER): Payer: Medicare (Managed Care) | Admitting: Internal Medicine

## 2021-11-07 ENCOUNTER — Encounter: Payer: Self-pay | Admitting: Pulmonary Disease

## 2021-11-07 VITALS — BP 122/66 | HR 82 | Temp 98.5°F | Ht 72.0 in | Wt 202.2 lb

## 2021-11-07 VITALS — BP 112/66 | HR 67 | Temp 97.5°F | Ht 72.0 in | Wt 202.8 lb

## 2021-11-07 DIAGNOSIS — E878 Other disorders of electrolyte and fluid balance, not elsewhere classified: Secondary | ICD-10-CM

## 2021-11-07 DIAGNOSIS — N1831 Chronic kidney disease, stage 3a: Secondary | ICD-10-CM | POA: Diagnosis not present

## 2021-11-07 DIAGNOSIS — J181 Lobar pneumonia, unspecified organism: Secondary | ICD-10-CM | POA: Diagnosis not present

## 2021-11-07 NOTE — Progress Notes (Signed)
? ?'@Patient'$  ID: Edward Mullen, male    DOB: [birth year], 66 y.o.   MRN: [id number] ? ?Chief Complaint  ?Patient presents with  ? Hospitalization Follow-up  ?  Pt is here for hospital follow up. Pt states he had fluid buildup. He is currently on Spironolactone   ? ? ?Referring provider: ?Harvie Heck, MD ? ?HPI:  ? ?66 y.o. man whom we are seeing in follow-up after hospital discharge for pneumonia and hemoptysis.  Discharge summary reviewed. ? ?Overall, doing well.  Cough resolved.  No further hemoptysis.  Reviewed at length his CT scan, right middle lobe pneumonia, wispy left lower lobe infiltrate as well.  Suspect hemoptysis in setting of infection, likely worsened by pulmonary venous hypertension as demonstrated by volume overload during his hospitalization.  He denies any dyspnea.  Use albuterol nebulizer as needed.  Has been a big problem.  Discussed at length the rationale for repeating CT scan to ensure resolution of pneumonia.  In particular with his history of smoking, make sure is not developing mass or lung cancer.  Given the appearance of mostly GGO, I think this is unlikely but need to make sure. ? ?\ ? ? ?Questionaires / Pulmonary Flowsheets:  ? ?ACT:  ?   ? View : No data to display.  ?  ?  ?  ? ? ?MMRC: ?   ? View : No data to display.  ?  ?  ?  ? ? ?[place]:  ?   ? View : No data to display.  ?  ?  ?  ? ? ?Tests:  ? ?FENO:  ?No results found for: NITRICOXIDE ? ?PFT: ?   ? View : No data to display.  ?  ?  ?  ? ? ?WALK:  ?   ? View : No data to display.  ?  ?  ?  ? ? ?Imaging: ?Personally reviewed and as per EMR discussion this note ?CARDIAC CATHETERIZATION ? ?Result Date: 10/10/2021 ?  1st RPL lesion is 80% stenosed.   Mid LAD lesion is 30% stenosed. 1. Stable coronaries, there is 80% stenosis in a relatively small PLV branch. 2. Mildly elevated PCWP and LVEDP, normal RA pressure. 3. Cardiac index is decreased at 1.9.   ? ?Lab Results: ?Personally reviewed ?CBC ?   ?Component Value Date/Time  ? WBC  9.0 10/11/2021 0430  ? RBC 4.76 10/11/2021 0430  ? HGB 13.2 10/11/2021 0430  ? HGB 14.1 03/09/2019 1100  ? HCT 41.8 10/11/2021 0430  ? HCT 42.6 03/09/2019 1100  ? PLT 164 10/11/2021 0430  ? PLT 192 03/09/2019 1100  ? MCV 87.8 10/11/2021 0430  ? MCV 86 03/09/2019 1100  ? MCH 27.7 10/11/2021 0430  ? MCHC 31.6 10/11/2021 0430  ? RDW 15.9 (H) 10/11/2021 0430  ? RDW 13.5 03/09/2019 1100  ? LYMPHSABS 1.0 10/07/2021 0500  ? LYMPHSABS 1.3 03/09/2019 1100  ? MONOABS 1.6 (H) 10/07/2021 0500  ? EOSABS 0.0 10/07/2021 0500  ? EOSABS 0.3 03/09/2019 1100  ? BASOSABS 0.0 10/07/2021 0500  ? BASOSABS 0.1 03/09/2019 1100  ? ? ?BMET ?   ?Component Value Date/Time  ? NA 143 10/26/2021 1043  ? K 4.6 10/26/2021 1043  ? CL 104 10/26/2021 1043  ? CO2 21 10/26/2021 1043  ? GLUCOSE 81 10/26/2021 1043  ? GLUCOSE 98 10/11/2021 0430  ? BUN 44 (H) 10/26/2021 1043  ? CREATININE 2.57 (H) 10/26/2021 1043  ? CREATININE 1.21 06/19/2012 1346  ? CALCIUM 9.1 10/26/2021 1043  ?  GFRNONAA 48 (L) 10/11/2021 0430  ? GFRNONAA 66 06/19/2012 1346  ? GFRAA 59 (L) 08/01/2020 1046  ? GFRAA 76 06/19/2012 1346  ? ? ?BNP ?   ?Component Value Date/Time  ? BNP 3,768.9 (H) 10/04/2021 1153  ? ? ?ProBNP ?No results found for: PROBNP ? ?Specialty Problems   ? ?  ? Pulmonary Problems  ? Hemoptysis  ? ? ?No Known Allergies ? ?Immunization History  ?Administered Date(s) Administered  ? Influenza Split 05/22/2011, 05/15/2012  ? Influenza,inj,Quad PF,6+ Mos 09/21/2014, 08/24/2016, 04/29/2017, 05/11/2019, 10/19/2021  ? PNEUMOCOCCAL CONJUGATE-20 10/19/2021  ? Pneumococcal Polysaccharide-23 09/21/2014  ? Tdap 05/22/2011, 10/19/2021  ? ? ?Past Medical History:  ?Diagnosis Date  ? Arthritis   ? "feels like it in my legs" (09/20/2014)  ? Borderline type 2 diabetes mellitus   ? patient denies  ? CHF (congestive heart failure) ([place])   ? Dysrhythmia   ? Hypertension   ? NSVT (nonsustained ventricular tachycardia) 09/06/2016  ? ? ?Tobacco History: ?Social History  ? ?Tobacco Use  ?Smoking  Status Every Day  ? Packs/day: 0.10  ? Years: 48.00  ? Pack years: 4.80  ? Types: Cigarettes  ?Smokeless Tobacco Never  ?Tobacco Comments  ? cutting back 2  per day  ? ?Ready to quit: Not Answered ?Counseling given: Not Answered ?Tobacco comments: cutting back 2  per day ? ? ?Continue to not smoke ? ?Outpatient Encounter Medications as of 11/07/2021  ?Medication Sig  ? albuterol (PROVENTIL) (2.5 MG/3ML) 0.083% nebulizer solution Take 3 mLs (2.5 mg total) by nebulization every 6 (six) hours as needed for shortness of breath or wheezing.  ? albuterol (VENTOLIN HFA) 108 (90 Base) MCG/ACT inhaler INHALE 1 TO 2 PUFFS BY MOUTH EVERY 6 HOURS AS NEEDED FOR WHEEZING OR SHORTNESS OF BREATH  ? amiodarone (PACERONE) 200 MG tablet Take 1 tablet (200 mg total) by mouth 2 (two) times daily for 14 days, then take 1 tablet by mouth daily after.  ? amiodarone (PACERONE) 200 MG tablet Take 1 tablet (200 mg total) by mouth daily.  ? bismuth subsalicylate (PEPTO BISMOL) 262 MG/15ML suspension Take 30 mLs by mouth every 6 (six) hours as needed for indigestion.  ? Blood Pressure Monitor DEVI Please use to monitor blood pressure as directed.  ICD: I10 (hypertension) (Patient taking differently: Please use to monitor blood pressure as directed.  ICD: I10 (hypertension))  ? digoxin (LANOXIN) 0.125 MG tablet TAKE 1 TABLET (0.125 MG TOTAL) BY MOUTH DAILY.  ? empagliflozin (JARDIANCE) 10 MG TABS tablet Take 1 tablet (10 mg total) by mouth daily.  ? losartan (COZAAR) 25 MG tablet Take 1/2 tablet (12.5 mg total) by mouth daily.  ? rosuvastatin (CRESTOR) 5 MG tablet TAKE 1 TABLET (5 MG TOTAL) BY MOUTH DAILY. (Patient taking differently: Take 5 mg by mouth daily.)  ? spironolactone (ALDACTONE) 25 MG tablet Take 1 tablet (25 mg total) by mouth daily.  ? tamsulosin (FLOMAX) 0.4 MG CAPS capsule TAKE ONE CAPSULE BY MOUTH ONCE DAILY AFTER SUPPER (Patient taking differently: Take 0.4 mg by mouth daily.)  ? torsemide (DEMADEX) 20 MG tablet Take 2 tablets  (40 mg total) by mouth daily.  ? ?No facility-administered encounter medications on file as of 11/07/2021.  ? ? ? ?Review of Systems ? ?Review of Systems  ?No chest pain exertion.  No orthopnea or PND.  Comprehensive review of systems otherwise negative. ?Physical Exam ? ?BP 122/66 (BP Location: Left Arm, Patient Position: Sitting, Cuff Size: Normal)   Pulse 82  Temp 98.5 ?F (36.9 ?C) (Oral)   Ht 6' (1.829 m)   Wt 202 lb 3.2 oz (91.7 kg)   SpO2 97%   BMI 27.42 kg/m?  ? ?Wt Readings from Last 5 Encounters:  ?11/07/21 202 lb 3.2 oz (91.7 kg)  ?11/07/21 202 lb 12.8 oz (92 kg)  ?11/01/21 196 lb (88.9 kg)  ?10/26/21 200 lb (90.7 kg)  ?10/19/21 199 lb 14.4 oz (90.7 kg)  ? ? ?BMI Readings from Last 5 Encounters:  ?11/07/21 27.42 kg/m?  ?11/07/21 27.50 kg/m?  ?11/01/21 26.58 kg/m?  ?10/26/21 27.12 kg/m?  ?10/19/21 27.11 kg/m?  ? ? ? ?Physical Exam ?General: Sitting in chair, no acute distress ?Eyes: EOMI, icterus ?Neck: Supple, no JVP appreciated significant ?Lungs: Clear, no work of breathing ?CV: Warm, no edema, wearing LifeVest ? ? ?Assessment & Plan:  ? ?Pneumonia: Right middle lobe most notably, little bit in the left lower lobe.  Treated with IV antibiotics while admitted to the hospital.  Repeat CT scan in a couple weeks for approximate 6-week follow-up to assess resolution particularly right middle lobe opacity more groundglass or to be infection but given location would want to make sure improving.  Especially in setting of history of cigarette smoking. ? ?Hemoptysis: In setting of pneumonia, pulmonary venous hypertension.  Now resolved. ? ? ?Return if symptoms worsen or fail to improve. ? ? ?Lanier Clam, MD ?11/07/2021 ? ? ?This appointment required 40 minutes of patient care (this includes precharting, chart review, review of results, face-to-face care, etc.). ? ?

## 2021-11-07 NOTE — Patient Instructions (Signed)
Nice to see you again ? ?We will repeat a CT chest scan in a couple of weeks to make sure pneumonia is better.  ? ?Follow up based on result of scan.  ?

## 2021-11-07 NOTE — Patient Instructions (Signed)
Please continue taking your medications EXCEPT Losartan and Spironolactone ? ?I will call you with lab results  ?

## 2021-11-07 NOTE — Progress Notes (Signed)
? ?CC: routine clinic follow up for persistent AKI  ? ?HPI: ? ?[name] is a 66 y.o. male with a PMHx stated below and presents today for stated above. Please see the Encounters tab for problem-based Assessment & Plan for additional details.  ? ?Past Medical History:  ?Diagnosis Date  ? Arthritis   ? "feels like it in my legs" (09/20/2014)  ? Borderline type 2 diabetes mellitus   ? patient denies  ? CHF (congestive heart failure) ([place])   ? Dysrhythmia   ? Hypertension   ? NSVT (nonsustained ventricular tachycardia) 09/06/2016  ? ? ?Current Outpatient Medications on File Prior to Visit  ?Medication Sig Dispense Refill  ? albuterol (PROVENTIL) (2.5 MG/3ML) 0.083% nebulizer solution Take 3 mLs (2.5 mg total) by nebulization every 6 (six) hours as needed for shortness of breath or wheezing. 75 mL 2  ? albuterol (VENTOLIN HFA) 108 (90 Base) MCG/ACT inhaler INHALE 1 TO 2 PUFFS BY MOUTH EVERY 6 HOURS AS NEEDED FOR WHEEZING OR SHORTNESS OF BREATH (Patient not taking: Reported on 10/04/2021) 18 g 2  ? amiodarone (PACERONE) 200 MG tablet Take 1 tablet (200 mg total) by mouth 2 (two) times daily for 14 days, then take 1 tablet by mouth daily after. 44 tablet 0  ? amiodarone (PACERONE) 200 MG tablet Take 1 tablet (200 mg total) by mouth daily. 30 tablet 3  ? bismuth subsalicylate (PEPTO BISMOL) 262 MG/15ML suspension Take 30 mLs by mouth every 6 (six) hours as needed for indigestion.    ? Blood Pressure Monitor DEVI Please use to monitor blood pressure as directed.  ICD: I10 (hypertension) (Patient taking differently: Please use to monitor blood pressure as directed.  ICD: I10 (hypertension)) 1 each 0  ? digoxin (LANOXIN) 0.125 MG tablet TAKE 1 TABLET (0.125 MG TOTAL) BY MOUTH DAILY. 90 tablet 1  ? empagliflozin (JARDIANCE) 10 MG TABS tablet Take 1 tablet (10 mg total) by mouth daily. 30 tablet 6  ? losartan (COZAAR) 25 MG tablet Take 1/2 tablet (12.5 mg total) by mouth daily. 30 tablet 1  ? rosuvastatin (CRESTOR) 5 MG  tablet TAKE 1 TABLET (5 MG TOTAL) BY MOUTH DAILY. (Patient taking differently: Take 5 mg by mouth daily.) 90 tablet 3  ? spironolactone (ALDACTONE) 25 MG tablet Take 1 tablet (25 mg total) by mouth daily. 30 tablet 2  ? tamsulosin (FLOMAX) 0.4 MG CAPS capsule TAKE ONE CAPSULE BY MOUTH ONCE DAILY AFTER SUPPER (Patient taking differently: Take 0.4 mg by mouth daily.) 30 capsule 2  ? torsemide (DEMADEX) 20 MG tablet Take 2 tablets (40 mg total) by mouth daily. 60 tablet 2  ? ?No current facility-administered medications on file prior to visit.  ? ? ?Family History  ?Problem Relation Age of Onset  ? Stroke Mother 34  ? Heart disease Father 15  ? Colon cancer Neg Hx   ? Colon polyps Neg Hx   ? Esophageal cancer Neg Hx   ? Stomach cancer Neg Hx   ? Rectal cancer Neg Hx   ? ? ?Social History  ? ?Socioeconomic History  ? Marital status: Married  ?  Spouse name: Not on file  ? Number of children: Not on file  ? Years of education: Not on file  ? Highest education level: Not on file  ?Occupational History  ? Occupation: Engineer, manufacturing systems  ?  Comment: has not been able to work steadily for the last year or two  ?Tobacco Use  ? Smoking status: Every Day  ?  Packs/day: 0.10  ?  Years: 48.00  ?  Pack years: 4.80  ?  Types: Cigarettes  ? Smokeless tobacco: Never  ? Tobacco comments:  ?  cutting back 2  per day  ?Vaping Use  ? Vaping Use: Never used  ?Substance and Sexual Activity  ? Alcohol use: Yes  ?  Alcohol/week: 1.0 standard drink  ?  Types: 1 Cans of beer per week  ?  Comment: occasionally  ? Drug use: Yes  ?  Types: Marijuana  ?  Comment: daily 4 times last 2 weeks ago as of 05/31/2019  ? Sexual activity: Yes  ?  Birth control/protection: None  ?Other Topics Concern  ? Not on file  ?Social History Narrative  ? Not on file  ? ?Social Determinants of Health  ? ?Financial Resource Strain: Low Risk   ? Difficulty of Paying Living Expenses: Not very hard  ?Food Insecurity: No Food Insecurity  ? Worried About Charity fundraiser in  the Last Year: Never true  ? Ran Out of Food in the Last Year: Never true  ?Transportation Needs: No Transportation Needs  ? Lack of Transportation (Medical): No  ? Lack of Transportation (Non-Medical): No  ?Physical Activity: Not on file  ?Stress: Not on file  ?Social Connections: Not on file  ?Intimate Partner Violence: Not on file  ? ? ?Review of Systems: ?ROS negative except for what is noted on the assessment and plan. ? ?Vitals:  ? 11/07/21 0946  ?BP: 112/66  ?Pulse: 67  ?Temp: (!) 97.5 ?F (36.4 ?C)  ?TempSrc: Oral  ?SpO2: 100%  ?Weight: 202 lb 12.8 oz (92 kg)  ?Height: 6' (1.829 m)  ? ? ? ?Physical Exam: ?Constitutional: alert, well-appearing, in NAD ?HENT: normocephalic, atraumatic, mucous membranes moist ?Eyes: conjunctiva non-erythematous, EOMI ?Cardiovascular: RRR, no m/r/g, non-edematous bilateral LE ?Pulmonary/Chest: normal work of breathing on RA, LCTAB ?Abdominal: soft, non-tender to palpation, non-distended ?MSK: normal bulk and tone  ?Neurological: A&O x 3 and follows commands  ?Skin: warm and dry  ?Psych: normal behavior, normal affect  ? ? ?Assessment & Plan:  ? ?See Encounters Tab for problem based charting. ? ?Patient discussed with Dr. Saverio Danker ? ?Lajean Manes, MD  ?Internal Medicine Resident, PGY-1 ?[facility]  ? ?

## 2021-11-07 NOTE — Assessment & Plan Note (Addendum)
1 week follow up for AKI on CKD stage 3; up-trending Cr from 1.6 on hospital discharge 10/11/21 -> 2.25 at hospital f/u ~3 weeks ago -> to 2.5 about 2 weeks ago. Stable GFR since out of the hospital, though slightly lower than at discharge. Suspect this is 2/2 soft pressures in setting of GDMT. Pt has been holding Losartan for about 2.5 weeks and has been holding Arlyce Harman for about ~2 weeks at this point. Pressures are much improved from 90/60 to 112/66 today; anticipate near-resolution with improved BP. Will follow up with BMET today. He is scheduled for cardiology follow up on 4/4, and is aware.  ? ?Addendum: ?Improved Cr from 2.5 -> 1.8 (baseline ~1.6) after holding Losartan and Spiro and improved BP. Continue to hold losartan and spiro at this time, continue with jardiance and diuretic regimenm, and follow up with cardiology next week. Can consider starting very low dose of losartan in near future once.  ?

## 2021-11-08 ENCOUNTER — Other Ambulatory Visit (HOSPITAL_COMMUNITY): Payer: Self-pay

## 2021-11-08 LAB — BMP8+ANION GAP
Anion Gap: 18 mmol/L (ref 10.0–18.0)
BUN/Creatinine Ratio: 21 (ref 10–24)
BUN: 39 mg/dL — ABNORMAL HIGH (ref 8–27)
CO2: 24 mmol/L (ref 20–29)
Calcium: 9.5 mg/dL (ref 8.6–10.2)
Chloride: 98 mmol/L (ref 96–106)
Creatinine, Ser: 1.82 mg/dL — ABNORMAL HIGH (ref 0.76–1.27)
Glucose: 70 mg/dL (ref 70–99)
Potassium: 3.8 mmol/L (ref 3.5–5.2)
Sodium: 140 mmol/L (ref 134–144)
eGFR: 40 mL/min/{1.73_m2} — ABNORMAL LOW (ref >59)

## 2021-11-08 NOTE — Progress Notes (Signed)
Paramedicine Encounter ? ? ? Patient ID: Edward Mullen, male    DOB: [birth year], 66 y.o.   MRN: [id number] ? ? ?Arrived for home visit for Edward Mullen who reports feeling good with no complaints. He looks good today with no shortness of breath and no lower leg swelling. Vitals were obtained and assessment as noted.  ? ?Noted read from appointments yesterday, still holding spironolactone and losartan.  ? ?Medications reviewed and confirmed. Pill box filled and confirmed.  ? ?Appointments reviewed and confirmed as well. He agrees to meet in clinic on Tuesday at 0900.  ? ?Refills: ?Torsemide  ? ?Home visit complete.  ? ?Patient Care Team: ?Edward Heck, MD as PCP - General ?Edward Mullen as Social Worker ? ?Patient Active Problem List  ? Diagnosis Date Noted  ? Type 2 diabetes mellitus with stage 3b chronic kidney disease, without long-term current use of insulin ([place]) 10/05/2021  ? Hemoptysis   ? Acute exacerbation of CHF (congestive heart failure) ([place]) 10/04/2021  ? Urinary retention   ? Scrotal mass 08/01/2020  ? Nocturia 08/01/2020  ? Lumbar spinal stenosis 06/08/2019  ? Healthcare maintenance 05/11/2019  ? Housing problems 05/11/2019  ? Difficulty urinating 04/07/2018  ? Colon cancer screening 04/07/2018  ? Anemia 12/19/2016  ? CKD (chronic kidney disease) stage 3, GFR 30-59 ml/min ([facility]) 12/13/2016  ? Acute on chronic systolic heart failure ([place]) 09/06/2016  ? Non-ischemic cardiomyopathy ([place]) 08/27/2016  ? Non-obstructive hypertrophic cardiomyopathy ([place]) 08/27/2016  ? Mitral regurgitation 08/27/2016  ? Polysubstance abuse ([place])   ? Tobacco abuse 09/19/2014  ? HTN (hypertension) 03/28/2011  ? Bilateral lower extremity pain 03/28/2011  ? ? ?Current Outpatient Medications:  ?  albuterol (PROVENTIL) (2.5 MG/3ML) 0.083% nebulizer solution, Take 3 mLs (2.5 mg total) by nebulization every 6 (six) hours as needed for shortness of breath or wheezing., Disp: 75 mL, Rfl: 2 ?  albuterol (VENTOLIN HFA) 108 (90 Base) MCG/ACT  inhaler, INHALE 1 TO 2 PUFFS BY MOUTH EVERY 6 HOURS AS NEEDED FOR WHEEZING OR SHORTNESS OF BREATH, Disp: 18 g, Rfl: 2 ?  amiodarone (PACERONE) 200 MG tablet, Take 1 tablet (200 mg total) by mouth 2 (two) times daily for 14 days, then take 1 tablet by mouth daily after., Disp: 44 tablet, Rfl: 0 ?  amiodarone (PACERONE) 200 MG tablet, Take 1 tablet (200 mg total) by mouth daily., Disp: 30 tablet, Rfl: 3 ?  bismuth subsalicylate (PEPTO BISMOL) 262 MG/15ML suspension, Take 30 mLs by mouth every 6 (six) hours as needed for indigestion., Disp: , Rfl:  ?  Blood Pressure Monitor DEVI, Please use to monitor blood pressure as directed.  ICD: I10 (hypertension) (Patient taking differently: Please use to monitor blood pressure as directed.  ICD: I10 (hypertension)), Disp: 1 each, Rfl: 0 ?  digoxin (LANOXIN) 0.125 MG tablet, TAKE 1 TABLET (0.125 MG TOTAL) BY MOUTH DAILY., Disp: 90 tablet, Rfl: 1 ?  empagliflozin (JARDIANCE) 10 MG TABS tablet, Take 1 tablet (10 mg total) by mouth daily., Disp: 30 tablet, Rfl: 6 ?  losartan (COZAAR) 25 MG tablet, Take 1/2 tablet (12.5 mg total) by mouth daily., Disp: 30 tablet, Rfl: 1 ?  rosuvastatin (CRESTOR) 5 MG tablet, TAKE 1 TABLET (5 MG TOTAL) BY MOUTH DAILY. (Patient taking differently: Take 5 mg by mouth daily.), Disp: 90 tablet, Rfl: 3 ?  spironolactone (ALDACTONE) 25 MG tablet, Take 1 tablet (25 mg total) by mouth daily., Disp: 30 tablet, Rfl: 2 ?  tamsulosin (FLOMAX) 0.4 MG CAPS capsule, TAKE ONE  CAPSULE BY MOUTH ONCE DAILY AFTER SUPPER (Patient taking differently: Take 0.4 mg by mouth daily.), Disp: 30 capsule, Rfl: 2 ?  torsemide (DEMADEX) 20 MG tablet, Take 2 tablets (40 mg total) by mouth daily., Disp: 60 tablet, Rfl: 2 ?No Known Allergies ? ? ?Social History  ? ?Socioeconomic History  ? Marital status: Married  ?  Spouse name: Not on file  ? Number of children: Not on file  ? Years of education: Not on file  ? Highest education level: Not on file  ?Occupational History  ?  Occupation: Engineer, manufacturing systems  ?  Comment: has not been able to work steadily for the last year or two  ?Tobacco Use  ? Smoking status: Every Day  ?  Packs/day: 0.10  ?  Years: 48.00  ?  Pack years: 4.80  ?  Types: Cigarettes  ? Smokeless tobacco: Never  ? Tobacco comments:  ?  cutting back 2  per day  ?Vaping Use  ? Vaping Use: Never used  ?Substance and Sexual Activity  ? Alcohol use: Yes  ?  Alcohol/week: 1.0 standard drink  ?  Types: 1 Cans of beer per week  ?  Comment: occasionally  ? Drug use: Yes  ?  Types: Marijuana  ?  Comment: daily 4 times last 2 weeks ago as of 05/31/2019  ? Sexual activity: Yes  ?  Birth control/protection: None  ?Other Topics Concern  ? Not on file  ?Social History Narrative  ? Not on file  ? ?Social Determinants of Health  ? ?Financial Resource Strain: Low Risk   ? Difficulty of Paying Living Expenses: Not very hard  ?Food Insecurity: No Food Insecurity  ? Worried About Charity fundraiser in the Last Year: Never true  ? Ran Out of Food in the Last Year: Never true  ?Transportation Needs: No Transportation Needs  ? Lack of Transportation (Medical): No  ? Lack of Transportation (Non-Medical): No  ?Physical Activity: Not on file  ?Stress: Not on file  ?Social Connections: Not on file  ?Intimate Partner Violence: Not on file  ? ? ?Physical Exam ?Vitals reviewed.  ?Constitutional:   ?   Appearance: Normal appearance. He is normal weight.  ?HENT:  ?   Head: Normocephalic.  ?   Nose: Nose normal.  ?   Mouth/Throat:  ?   Mouth: Mucous membranes are moist.  ?   Pharynx: Oropharynx is clear.  ?Eyes:  ?   Conjunctiva/sclera: Conjunctivae normal.  ?   Pupils: Pupils are equal, round, and reactive to light.  ?Cardiovascular:  ?   Rate and Rhythm: Normal rate and regular rhythm.  ?   Pulses: Normal pulses.  ?   Heart sounds: Normal heart sounds.  ?Pulmonary:  ?   Effort: Pulmonary effort is normal.  ?   Breath sounds: Normal breath sounds.  ?Abdominal:  ?   General: Abdomen is flat.  ?   Palpations:  Abdomen is soft.  ?Musculoskeletal:     ?   General: No swelling. Normal range of motion.  ?   Cervical back: Normal range of motion.  ?   Right lower leg: No edema.  ?   Left lower leg: No edema.  ?Skin: ?   General: Skin is warm and dry.  ?   Capillary Refill: Capillary refill takes less than 2 seconds.  ?Neurological:  ?   General: No focal deficit present.  ?   Mental Status: He is alert. Mental status is at baseline.  ?  Psychiatric:     ?   Mood and Affect: Mood normal.  ? ? ? ? ? ? ?Future Appointments  ?Date Time Provider [place]  ?11/13/2021  9:00 AM [facility] PA/NP [facility] None  ?11/29/2021 10:15 AM Edward Heck, MD [facility]  ? ? ? ?ACTION: ?Home visit completed ? ? ? ? ? ? ?

## 2021-11-08 NOTE — Progress Notes (Signed)
Internal Medicine Clinic Attending ? ?Case discussed with Dr. Posey Pronto  At the time of the visit.  We reviewed the resident?s history and exam and pertinent patient test results.  I agree with the assessment, diagnosis, and plan of care documented in the resident?s note. Ideally would be on at least low dose ARB and/or spiro for GDMT, however limited by blood pressures and suspected effect on kidney function. Rechecking kidney function today off losartan and spiro with improved BP, continues on Jardiance and diuretic regimen. Follow up with cardiology next week. ?

## 2021-11-12 NOTE — Progress Notes (Signed)
?Advanced Heart Failure Clinic Note  ? ?Primary Care: Harvie Heck, MD ?HF Cardiologist: Dr. Aundra Dubin  ? ?HPI: ?Edward Mullen is a 66 y.o. male with history of HTN, DM, chronic systolic CHF, and polysubstance abuse.  ? ?Admitted 08/23/16 with acute systolic CHF in setting of substance abuse. Echo was done, showing EF 15-20%.  Diuresed with IV lasix and meds adjusted as tolerated.  UDS + for cocaine on admission. Cardiac cath showed coronary disease but not significant enough to explain cardiomyopathy. Down 23 lbs from admission weight with diuresis.  Discharge weight 193 lbs. ? ?He was positive for cocaine again in 2/18.   ? ?He was admitted with syncope in 5/18 after using cocaine.  He was noted to have NSVT on telemetry.  Syncope suspected due to VT, no ICD given active substance abuse but had Lifevest placed. EF 50% on 8/18 echo, so Lifevest subsequently removed. ? ?He started using cocaine again and stopped his cardiac medications. In 4/22, he was admitted with acute on chronic systolic CHF.  Frequent NSVT was noted and amiodarone was started.  He had echo showing EF 20-25% with normal RV.  LHC showed 80% PLV stenosis, managed medically.  ? ?Lost to f/u in the [facility]. Not seen back since 5/22. ? ?Admitted 3/23 with a/c CHF and PNA. Echo with EF 20-25%, RV mildly reduced. Started on milrinone and IV lasix. RHC showed mildly elevated PCWP and LVEDP, CI 1.9. Milrinone weaned off and GDMT titrated. Had NSVT/VT, discharged home with LifeVest, weight 202 lbs. ? ?Today he returns for post hospital HF follow up with paramedicne. PCP held spiro and losartan due to elevated kidney function. Has good days and bad, he gets SOB with walking fast. Trying to walk outside more for exercise. Overall feeling fine. Denies abnormal bleeding, palpitations, CP, dizziness, edema, or PND/Orthopnea. Appetite ok. No fever or chills. Weight at home 199-202 pounds. Taking all medications. Last used cocaine 2 weeks ago, smokes THC  daily. ? ?LifeVest interrogation (personally reviewed): average HR 74, daily steps 2246, no treatments. ? ?ECG (personally reviewed):  NSR, LBBB QRS 168 msec ? ?Labs (1/18): Digoxin 0.4, BNP 961 ?Labs (2/18): K 4, creatinine 1.38 ?Labs (3/18): K 4.4, creatinine 1.85, digoxin 0.4 ?Labs (5/18): hgb 12.6, K 4.3, creatinine 1.3, digoxin 0.5 ?Labs (6/18): TSH normal, K 4.4, creatinine 2.04, AST 42, ALT 37 ?Labs (8/18): K 4.8, creatinine 1.65 ?Labs (11/18): K 4.1, creatinine 1.77 ?Labs (10/20): K 3.8, creatinine 1.33, hgb 14.9 ?Labs (4/22): digoxin 0.5, K 4.4, creatinine 1.5 ?Labs (3/23): K 3.8, creatinine 1.82 ? ?ROS: All systems reviewed and negative except as per HPI.  ? ?Social History: Lives in [place] with daughter. Prior heavy ETOH, now quit.  Quit smoking 10/20. Has quit using cocaine.  Active marijuana.  ? ?Family History  ?Problem Relation Age of Onset  ? Stroke Mother 72  ? Heart disease Father 63  ? Colon cancer Neg Hx   ? Colon polyps Neg Hx   ? Esophageal cancer Neg Hx   ? Stomach cancer Neg Hx   ? Rectal cancer Neg Hx   ? ?Past Medical History: ?1. HTN  ?2. Type II diabetes ?3. Cocaine abuse ?4. Active smoking, suspect COPD.  ?5. Cardiomyopathy: Nonischemic.  ?- Echo (1/18) with EF 15-20%, moderate LVH, moderate AI, moderate to severe MR, normal RV size with mildly decreased systolic function, PASP 44 mmHg.  ?- LHC/RHC (1/18): 80% stenosis in PLV branch.  Mean RA 5, PA 47/20 mean 32, mean PCWP 22,  CI 2.01.  ?- Echo (8/18): EF 50% with moderate LVH, diffuse hypokinesis, normal RV size and systolic function, mild AI, trivial MR.  ?- Echo (4/22): EF 20-25%, severe LV dilation, normal RV. ?- Echo (2/23):  EF 20-25%, RV mildly reduced.  ?6. CAD: LHC (1/18) with 80% stenosis in a branch of the PLV (not enough CAD to explain cardiomyopathy).  ?- LHC (4/22): PLV 80%, 25% mLAD.  ?- R/LHC (2/23): 1st RPL 80%, 30% mLAD; mildly elevated PCWP and LVEDP, normal RA pressures, CI 1.9 ?7. Mitral regurgitation: Moderate  to severe on 1/18 echo, probably functional.  Trivial MR on 8/18 echo.  ?8. CKD: Stage 3.  ?9. Syncope: 5/18 in setting of cocaine use, concern for VT.  ?10. Lower extremity arterial dopplers (7/18): No significant PAD.  ?11. Sciatica ?12. NSVT ?13. CKD stage 3 ? ?Current Outpatient Medications  ?Medication Sig Dispense Refill  ? albuterol (PROVENTIL) (2.5 MG/3ML) 0.083% nebulizer solution Take 3 mLs (2.5 mg total) by nebulization every 6 (six) hours as needed for shortness of breath or wheezing. 75 mL 2  ? albuterol (VENTOLIN HFA) 108 (90 Base) MCG/ACT inhaler INHALE 1 TO 2 PUFFS BY MOUTH EVERY 6 HOURS AS NEEDED FOR WHEEZING OR SHORTNESS OF BREATH 18 g 2  ? amiodarone (PACERONE) 200 MG tablet Take 1 tablet (200 mg total) by mouth daily. 30 tablet 3  ? bismuth subsalicylate (PEPTO BISMOL) 262 MG/15ML suspension Take 30 mLs by mouth every 6 (six) hours as needed for indigestion.    ? Blood Pressure Monitor DEVI Please use to monitor blood pressure as directed.  ICD: I10 (hypertension) (Patient taking differently: Please use to monitor blood pressure as directed.  ICD: I10 (hypertension)) 1 each 0  ? digoxin (LANOXIN) 0.125 MG tablet TAKE 1 TABLET (0.125 MG TOTAL) BY MOUTH DAILY. 90 tablet 1  ? empagliflozin (JARDIANCE) 10 MG TABS tablet Take 1 tablet (10 mg total) by mouth daily. 30 tablet 6  ? rosuvastatin (CRESTOR) 5 MG tablet TAKE 1 TABLET (5 MG TOTAL) BY MOUTH DAILY. 90 tablet 3  ? tamsulosin (FLOMAX) 0.4 MG CAPS capsule TAKE ONE CAPSULE BY MOUTH ONCE DAILY AFTER SUPPER 30 capsule 2  ? torsemide (DEMADEX) 20 MG tablet Take 2 tablets (40 mg total) by mouth daily. 60 tablet 2  ? losartan (COZAAR) 25 MG tablet Take 1/2 tablet (12.5 mg total) by mouth daily. (Patient not taking: Reported on 11/13/2021) 30 tablet 1  ? spironolactone (ALDACTONE) 25 MG tablet Take 1 tablet (25 mg total) by mouth daily. (Patient not taking: Reported on 11/08/2021) 30 tablet 2  ? ?No current facility-administered medications for this  encounter.  ? ?No Known Allergies ? ?BP 110/70   Pulse 74   Wt 90.4 kg (199 lb 6.4 oz)   SpO2 98%   BMI 27.04 kg/m?  ? ?Wt Readings from Last 3 Encounters:  ?11/13/21 90.4 kg (199 lb 6.4 oz)  ?11/08/21 91.6 kg (202 lb)  ?11/07/21 91.7 kg (202 lb 3.2 oz)  ?  ?PHYSICAL EXAM: ?General:  NAD. No resp difficulty, walked into clinic with cane ?HEENT: Normal ?Neck: Supple. No JVD. Carotids 2+ bilat; no bruits. No lymphadenopathy or thryomegaly appreciated. ?Cor: PMI nondisplaced. Regular rate & rhythm. No rubs, gallops or murmurs. ?Lungs: Rhonchi RLL, wearing LifeVest. ?Abdomen: Soft, nontender, nondistended. No hepatosplenomegaly. No bruits or masses. Good bowel sounds. ?Extremities: No cyanosis, clubbing, rash, edema ?Neuro: Alert & oriented x 3, cranial nerves grossly intact. Moves all 4 extremities w/o difficulty. Affect pleasant. ? ?ASSESSMENT &  PLAN: ?1. Chronic Biventricular Systolic Heart Failure:  EF 15-20% with moderate to severe MR on echo 1/18.  Nonischemic cardiomyopathy. HTN vs cocaine use vs viral vs ETOH. HIV negative, SPEP with no M-spike.  Patient has been noted to have CAD but not enough to explain cardiomyopathy (Cath this admission with stable 80% PLV stenosis).  Echo in 8/18 showed EF up to 50%. However, he stopped his meds and started cocaine again, echo in 4/22 with EF back down to 20-25% => suspect cocaine abuse may be the primary etiology of cardiomyopathy. Poor compliance w/ meds and f/u in the [facility].  Echo this admission (2/23) with EF 20-25%, RV mildly reduced. RHC (3/23) with mildly elevated PCWP and LVEDP, CI 1.9. Stable NYHA II symptoms, he is not volume overloaded today. GDMT limited to CKD. ?- Restart spironolactone 12.5 mg daily. BMET/BNP today, repeat BMET in 1 week. ?- Decrease torsemide to 20 mg daily. ?- Continue digoxin 0.125 mg daily. Check level today. ?- Continue Jardiance 10 mg daily. ?- Continue to hold spiro and losartan until repeat labs. ?- no ? blocker w/ low output  ?-  Continue LifeVest due to frequent runs of VT/ NSVT during admission. ?- Not a candidate for advanced therapies including home inotropes given h/o polysubstance use and poor compliance  ?- Continue paramedicine. ?- If he c

## 2021-11-13 ENCOUNTER — Encounter (HOSPITAL_COMMUNITY): Payer: Self-pay

## 2021-11-13 ENCOUNTER — Ambulatory Visit (HOSPITAL_COMMUNITY)
Admission: RE | Admit: 2021-11-13 | Discharge: 2021-11-13 | Disposition: A | Discharge status: Home / Self Care | Payer: Medicare (Managed Care) | Source: Ambulatory Visit | Attending: Cardiology | Admitting: Cardiology

## 2021-11-13 ENCOUNTER — Other Ambulatory Visit (HOSPITAL_COMMUNITY): Payer: Self-pay

## 2021-11-13 VITALS — BP 110/70 | HR 74 | Wt 199.4 lb

## 2021-11-13 DIAGNOSIS — F1410 Cocaine abuse, uncomplicated: Secondary | ICD-10-CM | POA: Insufficient documentation

## 2021-11-13 DIAGNOSIS — N183 Chronic kidney disease, stage 3 unspecified: Secondary | ICD-10-CM | POA: Insufficient documentation

## 2021-11-13 DIAGNOSIS — Z7984 Long term (current) use of oral hypoglycemic drugs: Secondary | ICD-10-CM | POA: Diagnosis not present

## 2021-11-13 DIAGNOSIS — I2510 Atherosclerotic heart disease of native coronary artery without angina pectoris: Secondary | ICD-10-CM | POA: Insufficient documentation

## 2021-11-13 DIAGNOSIS — I5022 Chronic systolic (congestive) heart failure: Secondary | ICD-10-CM | POA: Diagnosis not present

## 2021-11-13 DIAGNOSIS — I428 Other cardiomyopathies: Secondary | ICD-10-CM | POA: Insufficient documentation

## 2021-11-13 DIAGNOSIS — I4729 Other ventricular tachycardia: Secondary | ICD-10-CM

## 2021-11-13 DIAGNOSIS — I130 Hypertensive heart and chronic kidney disease with heart failure and stage 1 through stage 4 chronic kidney disease, or unspecified chronic kidney disease: Secondary | ICD-10-CM | POA: Diagnosis present

## 2021-11-13 DIAGNOSIS — E1122 Type 2 diabetes mellitus with diabetic chronic kidney disease: Secondary | ICD-10-CM | POA: Insufficient documentation

## 2021-11-13 DIAGNOSIS — Z91148 Patient's other noncompliance with medication regimen for other reason: Secondary | ICD-10-CM | POA: Diagnosis not present

## 2021-11-13 DIAGNOSIS — R042 Hemoptysis: Secondary | ICD-10-CM | POA: Diagnosis not present

## 2021-11-13 DIAGNOSIS — I472 Ventricular tachycardia, unspecified: Secondary | ICD-10-CM | POA: Insufficient documentation

## 2021-11-13 DIAGNOSIS — I5082 Biventricular heart failure: Secondary | ICD-10-CM | POA: Diagnosis not present

## 2021-11-13 DIAGNOSIS — Z79899 Other long term (current) drug therapy: Secondary | ICD-10-CM | POA: Diagnosis not present

## 2021-11-13 LAB — BASIC METABOLIC PANEL
Anion gap: 11 (ref 5–15)
BUN: 28 mg/dL — ABNORMAL HIGH (ref 8–23)
CO2: 24 mmol/L (ref 22–32)
Calcium: 9.2 mg/dL (ref 8.9–10.3)
Chloride: 104 mmol/L (ref 98–111)
Creatinine, Ser: 1.63 mg/dL — ABNORMAL HIGH (ref 0.61–1.24)
GFR, Estimated: 46 mL/min — ABNORMAL LOW (ref >60)
Glucose, Bld: 87 mg/dL (ref 70–99)
Potassium: 3.5 mmol/L (ref 3.5–5.1)
Sodium: 139 mmol/L (ref 135–145)

## 2021-11-13 LAB — DIGOXIN LEVEL: Digoxin Level: 0.7 ng/mL — ABNORMAL LOW (ref 0.8–2.0)

## 2021-11-13 LAB — BRAIN NATRIURETIC PEPTIDE: B Natriuretic Peptide: 2745.7 pg/mL — ABNORMAL HIGH (ref 0.0–100.0)

## 2021-11-13 MED ORDER — ASPIRIN EC 81 MG PO TBEC: 81.0000 mg | DELAYED_RELEASE_TABLET | Freq: Every day | ORAL | 2 refills | Status: DC

## 2021-11-13 MED ORDER — SPIRONOLACTONE 25 MG PO TABS
12.5000 mg | ORAL_TABLET | Freq: Every day | ORAL | 2 refills | Status: DC
Start: 2021-11-13 — End: 2021-12-17

## 2021-11-13 MED ORDER — TORSEMIDE 20 MG PO TABS
20.0000 mg | ORAL_TABLET | Freq: Every day | ORAL | 2 refills | Status: DC
Start: 2021-11-13 — End: 2022-04-24

## 2021-11-13 NOTE — Progress Notes (Signed)
Paramedicine Encounter ? ? ? Patient ID: Edward Mullen, male    DOB: 1955-08-20, 66 y.o.   MRN: [id number] ? ? ?Met with [name] in clinic today where he was seen by [facility]. Over the last two weeks he has been holding his spironolactone and losartan per PCP. Janett Billow made aware. Weight is 202lbs at home and today is 199lbs. He denied chest pain, reports some shortness of breath while walking fast. Lungs still noted to be congested from pneumonia. He has no volume noted today.  ? ?Med changes as noted: ?-Restart aspirin 61m daily. ?-Torsemide reduced to 263mdaily. ?-Restart Spironolactone 12.72m24maily  ?-Continue holding Losartan  ? ?BMET in one week.  ? ?Labs stable and no further changes today.  ? ?Clinic visit complete. I will see [name] one week.  ? ? ? ? ? ?ACTION: ?Home visit completed ? ? ? ? ? ? ?

## 2021-11-13 NOTE — Patient Instructions (Addendum)
EKG done today.  ? ?Labs done today. We will contact you only if your labs are abnormal. ? ?RESTART Spironolactone 12.'5mg'$ (1/2 tablet) by mouth daily. ? ?RESTART Aspirin '81mg'$  (1 tablet) by mouth daily.  ? ?DECREASE Torsemide to '20mg'$  (1 tablet) by mouth daily.  ? ?No other medication changes were made. Please continue all current medications as prescribed. ? ?Your physician recommends that you schedule a follow-up appointment in: 1 week for a lab only appointment, 3-4 weeks with our Clinic Pharmacist, 8 weeks with our NP/PA Clinic and 3-4 months with Dr. Aundra Dubin all appointments here in our office. ? ?If you have any questions or concerns before your next appointment please send [place] a message through [place] or call our office at [phone].   ? ?TO LEAVE A MESSAGE FOR THE NURSE SELECT OPTION 2, PLEASE LEAVE A MESSAGE INCLUDING: ?YOUR NAME ?DATE OF BIRTH ?CALL BACK NUMBER ?REASON FOR CALL**this is important as we prioritize the call backs ? ?YOU WILL RECEIVE A CALL BACK THE SAME DAY AS LONG AS YOU CALL BEFORE 4:00 PM ? ? ?Do the following things EVERYDAY: ?Weigh yourself in the morning before breakfast. Write it down and keep it in a log. ?Take your medicines as prescribed ?Eat low salt foods--Limit salt (sodium) to 2000 mg per day.  ?Stay as active as you can everyday ?Limit all fluids for the day to less than 2 liters ? ? ?At the [facility], you and your health needs are our priority. As part of our continuing mission to provide you with exceptional heart care, we have created designated Provider Care Teams. These Care Teams include your primary Cardiologist (physician) and Advanced Practice Providers (APPs- Physician Assistants and Nurse Practitioners) who all work together to provide you with the care you need, when you need it.  ? ?You may see any of the following providers on your designated Care Team at your next follow up: ?Dr Glori Bickers ?Dr Loralie Champagne ?Darrick Grinder, NP ?Lyda Jester, PA ?[name], PharmD ? ? ?Please be sure to bring in all your medications bottles to every appointment.  ? ?

## 2021-11-20 ENCOUNTER — Encounter: Payer: Medicare (Managed Care) | Admitting: Internal Medicine

## 2021-11-20 ENCOUNTER — Other Ambulatory Visit (HOSPITAL_COMMUNITY): Payer: Self-pay

## 2021-11-20 NOTE — Progress Notes (Signed)
Paramedicine Encounter ? ? ? Patient ID: Edward Mullen, male    DOB: 1955/09/22, 66 y.o.   MRN: [id number] ? ? ? ?Arrived for home visit for Edward Mullen who reports feeling good with no complaints. He states that he has had no shortness of breath or dizziness. He reports he did have some chest pain yesterday while walking but no LifeVest alerts were noted. He said once he sat down the pain subsided. He is asking when he can see EP clinic regarding long term ICD. I will follow up.  ? ? ?Vitals and assessment obtained and as noted. No lower leg edema noted. Lungs clear.  ? ?I reviewed and confirmed all meds and filled pill box for one week.  ? ?I reviewed upcoming appointments with him and he agreed with same. He will get labs tomorrow, if any changes are made I will follow up.  ? ?Home visit complete. I will see him in one week.  ? ?Refills- NONE ? ? ?Patient Care Team: ?Harvie Heck, MD as PCP - General ?Drema Pry as Social Worker ? ?Patient Active Problem List  ? Diagnosis Date Noted  ? Type 2 diabetes mellitus with stage 3b chronic kidney disease, without long-term current use of insulin ([place]) 10/05/2021  ? Hemoptysis   ? Acute exacerbation of CHF (congestive heart failure) ([place]) 10/04/2021  ? Urinary retention   ? Scrotal mass 08/01/2020  ? Nocturia 08/01/2020  ? Lumbar spinal stenosis 06/08/2019  ? Healthcare maintenance 05/11/2019  ? Housing problems 05/11/2019  ? Difficulty urinating 04/07/2018  ? Colon cancer screening 04/07/2018  ? Anemia 12/19/2016  ? CKD (chronic kidney disease) stage 3, GFR 30-59 ml/min (HCC) 12/13/2016  ? Acute on chronic systolic heart failure ([place]) 09/06/2016  ? Non-ischemic cardiomyopathy ([place]) 08/27/2016  ? Non-obstructive hypertrophic cardiomyopathy ([place]) 08/27/2016  ? Mitral regurgitation 08/27/2016  ? Polysubstance abuse ([place])   ? Tobacco abuse 09/19/2014  ? HTN (hypertension) 03/28/2011  ? Bilateral lower extremity pain 03/28/2011  ? ? ?Current Outpatient Medications:  ?   albuterol (PROVENTIL) (2.5 MG/3ML) 0.083% nebulizer solution, Take 3 mLs (2.5 mg total) by nebulization every 6 (six) hours as needed for shortness of breath or wheezing., Disp: 75 mL, Rfl: 2 ?  albuterol (VENTOLIN HFA) 108 (90 Base) MCG/ACT inhaler, INHALE 1 TO 2 PUFFS BY MOUTH EVERY 6 HOURS AS NEEDED FOR WHEEZING OR SHORTNESS OF BREATH, Disp: 18 g, Rfl: 2 ?  amiodarone (PACERONE) 200 MG tablet, Take 1 tablet (200 mg total) by mouth daily., Disp: 30 tablet, Rfl: 3 ?  aspirin EC 81 MG tablet, Take 1 tablet (81 mg total) by mouth daily. Swallow whole., Disp: 30 tablet, Rfl: 2 ?  bismuth subsalicylate (PEPTO BISMOL) 262 MG/15ML suspension, Take 30 mLs by mouth every 6 (six) hours as needed for indigestion., Disp: , Rfl:  ?  Blood Pressure Monitor DEVI, Please use to monitor blood pressure as directed.  ICD: I10 (hypertension) (Patient taking differently: Please use to monitor blood pressure as directed.  ICD: I10 (hypertension)), Disp: 1 each, Rfl: 0 ?  digoxin (LANOXIN) 0.125 MG tablet, TAKE 1 TABLET (0.125 MG TOTAL) BY MOUTH DAILY., Disp: 90 tablet, Rfl: 1 ?  empagliflozin (JARDIANCE) 10 MG TABS tablet, Take 1 tablet (10 mg total) by mouth daily., Disp: 30 tablet, Rfl: 6 ?  rosuvastatin (CRESTOR) 5 MG tablet, TAKE 1 TABLET (5 MG TOTAL) BY MOUTH DAILY., Disp: 90 tablet, Rfl: 3 ?  spironolactone (ALDACTONE) 25 MG tablet, Take 0.5 tablets (12.5 mg total)  by mouth daily., Disp: 15 tablet, Rfl: 2 ?  tamsulosin (FLOMAX) 0.4 MG CAPS capsule, TAKE ONE CAPSULE BY MOUTH ONCE DAILY AFTER SUPPER, Disp: 30 capsule, Rfl: 2 ?  torsemide (DEMADEX) 20 MG tablet, Take 1 tablet (20 mg total) by mouth daily., Disp: 30 tablet, Rfl: 2 ?No Known Allergies ? ? ?Social History  ? ?Socioeconomic History  ? Marital status: Married  ?  Spouse name: Not on file  ? Number of children: Not on file  ? Years of education: Not on file  ? Highest education level: Not on file  ?Occupational History  ? Occupation: Engineer, manufacturing systems  ?  Comment: has not  been able to work steadily for the last year or two  ?Tobacco Use  ? Smoking status: Every Day  ?  Packs/day: 0.10  ?  Years: 48.00  ?  Pack years: 4.80  ?  Types: Cigarettes  ? Smokeless tobacco: Never  ? Tobacco comments:  ?  cutting back 2  per day  ?Vaping Use  ? Vaping Use: Never used  ?Substance and Sexual Activity  ? Alcohol use: Yes  ?  Alcohol/week: 1.0 standard drink  ?  Types: 1 Cans of beer per week  ?  Comment: occasionally  ? Drug use: Yes  ?  Types: Marijuana  ?  Comment: daily 4 times last 2 weeks ago as of 05/31/2019  ? Sexual activity: Yes  ?  Birth control/protection: None  ?Other Topics Concern  ? Not on file  ?Social History Narrative  ? Not on file  ? ?Social Determinants of Health  ? ?Financial Resource Strain: Low Risk   ? Difficulty of Paying Living Expenses: Not very hard  ?Food Insecurity: No Food Insecurity  ? Worried About Charity fundraiser in the Last Year: Never true  ? Ran Out of Food in the Last Year: Never true  ?Transportation Needs: No Transportation Needs  ? Lack of Transportation (Medical): No  ? Lack of Transportation (Non-Medical): No  ?Physical Activity: Not on file  ?Stress: Not on file  ?Social Connections: Not on file  ?Intimate Partner Violence: Not on file  ? ? ?Physical Exam ? ? ? ? ? ?Future Appointments  ?Date Time Provider [place]  ?11/21/2021  9:30 AM [facility] LAB [facility] None  ?11/26/2021 12:00 PM [facility] 2 [facility] [place]  ?11/29/2021 10:15 AM Harvie Heck, MD [facility]  ?12/11/2021 11:00 AM [facility] PHARMACY [facility] None  ?01/01/2022 10:00 AM [facility] PA/NP [facility] None  ? ? ? ?ACTION: ?Home visit completed ? ? ? ? ? ? ?

## 2021-11-21 ENCOUNTER — Ambulatory Visit (HOSPITAL_COMMUNITY)
Admission: RE | Admit: 2021-11-21 | Discharge: 2021-11-21 | Disposition: A | Discharge status: Home / Self Care | Payer: Medicare (Managed Care) | Source: Ambulatory Visit | Attending: Internal Medicine | Admitting: Internal Medicine

## 2021-11-21 DIAGNOSIS — I5022 Chronic systolic (congestive) heart failure: Secondary | ICD-10-CM | POA: Diagnosis present

## 2021-11-21 LAB — BASIC METABOLIC PANEL
Anion gap: 8 (ref 5–15)
BUN: 24 mg/dL — ABNORMAL HIGH (ref 8–23)
CO2: 27 mmol/L (ref 22–32)
Calcium: 9.2 mg/dL (ref 8.9–10.3)
Chloride: 104 mmol/L (ref 98–111)
Creatinine, Ser: 1.6 mg/dL — ABNORMAL HIGH (ref 0.61–1.24)
GFR, Estimated: 47 mL/min — ABNORMAL LOW (ref >60)
Glucose, Bld: 131 mg/dL — ABNORMAL HIGH (ref 70–99)
Potassium: 4.3 mmol/L (ref 3.5–5.1)
Sodium: 139 mmol/L (ref 135–145)

## 2021-11-26 ENCOUNTER — Ambulatory Visit (HOSPITAL_COMMUNITY)
Admission: RE | Admit: 2021-11-26 | Discharge: 2021-11-26 | Disposition: A | Discharge status: Home / Self Care | Payer: Medicare (Managed Care) | Source: Ambulatory Visit | Attending: Pulmonary Disease | Admitting: Pulmonary Disease

## 2021-11-26 DIAGNOSIS — J181 Lobar pneumonia, unspecified organism: Secondary | ICD-10-CM

## 2021-11-27 ENCOUNTER — Other Ambulatory Visit (HOSPITAL_COMMUNITY): Payer: Self-pay

## 2021-11-27 NOTE — Progress Notes (Incomplete)
***In Progress*** ? ?  ?Advanced Heart Failure Clinic Note  ? ?Primary Care: Harvie Heck, MD ?HF Cardiologist: Dr. Aundra Dubin  ?  ? ?HPI:  ?Edward Mullen is a 66 y.o. male with history of HTN, DM, chronic systolic CHF, and polysubstance abuse.  ? ?Admitted 08/23/16 with acute systolic CHF in setting of substance abuse. Echo was done, showing EF 15-20%.  Diuresed with IV Lasix and meds adjusted as tolerated.  UDS + for cocaine on admission. Cardiac cath showed coronary disease but not significant enough to explain cardiomyopathy. Down 23 lbs from admission weight with diuresis.  Discharge weight 193 lbs. ?  ?He was positive for cocaine again in 09/2016.   ?  ?He was admitted with syncope in 12/2016 after using cocaine.  He was noted to have NSVT on telemetry.  Syncope suspected due to VT, no ICD given active substance abuse but had Lifevest placed. EF 50% on 03/2017 echo, so Lifevest subsequently removed. ?  ?He started using cocaine again and stopped his cardiac medications. In 11/2020, he was admitted with acute on chronic systolic CHF.  Frequent NSVT was noted and amiodarone was started.  He had echo showing EF 20-25% with normal RV.  LHC showed 80% PLV stenosis, managed medically.  ?  ?Lost to f/u in the [facility]. Not seen back since 12/2020. ?  ?Admitted 10/2021 with a/c CHF and PNA. Echo with EF 20-25%, RV mildly reduced. Started on milrinone and IV Lasix. RHC showed mildly elevated PCWP and LVEDP, CI 1.9. Milrinone weaned off and GDMT titrated. Had NSVT/VT, discharged home with LifeVest, weight 202 lbs. ?  ?Recently returned to AHF clinic for post hospital HF follow up with paramedicine. PCP had held spironolactone and losartan due to elevated kidney function. Stated he had good days and bad. Noted he gets SOB with walking fast. Had been trying to walk outside more for exercise. Overall was feeling fine. Denied abnormal bleeding, palpitations, CP, dizziness, edema, or PND/Orthopnea. Appetite was ok. No fever or chills.  Weight at home was 199-202 pounds. Reported taking all medications. Last used cocaine 2 weeks ago, smokes THC daily. ? ?Today he returns to HF clinic for pharmacist medication titration. At last visit with APP, torsemide was decreased to 20 mg daily and spironolactone 12.5 mg daily was initiated. Additionally, aspirin was restarted since hemoptysis had resolved. ? ?Shortness of breath/dyspnea on exertion? {YES NO:22349}  ?Orthopnea/PND? {YES NO:22349} ?Edema? {YES NO:22349} ?Lightheadedness/dizziness? {YES NO:22349} ?Daily weights at home? {YES NO:22349} ?Blood pressure/heart rate monitoring at home? {YES NO:22349} ?Following low-sodium/fluid-restricted diet? {YES NO:22349} ? ?HF Medications: ?Spironolactone 12.5 mg daily ?Jardiance 10 mg daily ?Digoxin 0.125 mg daily ?Torsemide 20 mg daily ? ?Has the patient been experiencing any side effects to the medications prescribed?  {YES NO:22349} ? ?Does the patient have any problems obtaining medications due to transportation or finances?   {YES NO:22349} ? ?Understanding of regimen: {excellent/good/fair/poor:19665} ?Understanding of indications: {excellent/good/fair/poor:19665} ?Potential of compliance: {excellent/good/fair/poor:19665} ?Patient understands to avoid NSAIDs. ?Patient understands to avoid decongestants. ?  ? ?Pertinent Lab Values: ?Serum creatinine ***, BUN ***, Potassium ***, Sodium ***, BNP ***, Magnesium ***, Digoxin ***  ? ?Vital Signs: ?Weight: *** (last clinic weight: ***) ?Blood pressure: ***  ?Heart rate: ***  ? ?Assessment/Plan: ?1. Chronic Biventricular Systolic Heart Failure:  EF 15-20% with moderate to severe MR on echo 08/2016.  Nonischemic cardiomyopathy. HTN vs cocaine use vs viral vs ETOH. HIV negative, SPEP with no M-spike.  Patient has been noted to have CAD but not  enough to explain cardiomyopathy (Cath this admission with stable 80% PLV stenosis).  Echo in 03/2017 showed EF up to 50%. However, he stopped his meds and started cocaine again,  echo in 11/2020 with EF back down to 20-25% => suspect cocaine abuse may be the primary etiology of cardiomyopathy. Poor compliance w/ meds and f/u in the [facility].  Echo 09/2021 with EF 20-25%, RV mildly reduced. [place] 10/2021 with mildly elevated PCWP and LVEDP, CI 1.9.  ?- Stable NYHA II symptoms, he is not volume overloaded today. GDMT limited to CKD. ?- Continue torsemide 20 mg daily. ?- Continue spironolactone 12.5 mg daily. ?- Continue Jardiance 10 mg daily. ?- Continue digoxin 0.125 mg daily.  ?- no ? blocker yet w/ low output  ?- Continue LifeVest due to frequent runs of VT/ NSVT during admission. ?- Not a candidate for advanced therapies including home inotropes given h/o polysubstance use and poor compliance  ?- Continue paramedicine. ?- If he can stay off cocaine, should be CRT-D candidate with wide LBBB.  ?2. VT/NSVT: Multiple runs this admit, longest was 28 beats. Coronary angiography showed no change in coronaries.  ?- Continue LifeVest. ?- Continue amiodarone 200 mg daily. ?3. CKD III: Baseline SCr 1.6.  ?4. Hemoptysis: CT w/ contrast not suggestive of PE, no evidence of nodules/masses.   ?- Pulmonary following, now resolved. ?5. CAD: 80% stenosis PLV on cath this admission.  No chest pain. ?- Continue statin.  ?- Continue ASA now that hemoptysis has resolved. ?- off ? blocker w/ low output.  ?6. Cocaine abuse: Last used 2 weeks ago. *** ?- Discussed cessation. ? ?Follow up *** ? ? ?[name], PharmD, BCPS, BCCP, CPP ?Heart Failure Clinic Pharmacist ?[phone] ?  ?

## 2021-11-27 NOTE — Progress Notes (Signed)
Paramedicine Encounter ? ? ? Patient ID: DOCK BACCAM, male    DOB: 1956/02/02, 66 y.o.   MRN: [id number] ? ?Arrived for home visit for Edward Mullen who reports feeling good denying shortness of breath, dizziness, chest pain. No lower leg edema noted. Lungs clear. Vitals within normal limits. He has been compliant with his medications except some of the evening doses. I advised him to set an alarm to remember to take all prescribed medications. He agreed with this plan. We reviewed appointments and confirmed same. I reviewed medications and filled pill box for one week. Home visit complete.  ? ?Refills: NONE  ? ?Patient Care Team: ?Harvie Heck, MD as PCP - General ?Drema Pry as Social Worker ? ?Patient Active Problem List  ? Diagnosis Date Noted  ? Type 2 diabetes mellitus with stage 3b chronic kidney disease, without long-term current use of insulin ([place]) 10/05/2021  ? Hemoptysis   ? Acute exacerbation of CHF (congestive heart failure) ([place]) 10/04/2021  ? Urinary retention   ? Scrotal mass 08/01/2020  ? Nocturia 08/01/2020  ? Lumbar spinal stenosis 06/08/2019  ? Healthcare maintenance 05/11/2019  ? Housing problems 05/11/2019  ? Difficulty urinating 04/07/2018  ? Colon cancer screening 04/07/2018  ? Anemia 12/19/2016  ? CKD (chronic kidney disease) stage 3, GFR 30-59 ml/min (HCC) 12/13/2016  ? Acute on chronic systolic heart failure ([place]) 09/06/2016  ? Non-ischemic cardiomyopathy ([place]) 08/27/2016  ? 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 Mitral regurgitation 08/27/2016  ? Polysubstance abuse ([place])   ? Tobacco abuse 09/19/2014  ? HTN (hypertension) 03/28/2011  ? Bilateral lower extremity pain 03/28/2011  ? ? ?Current Outpatient Medications:  ?  albuterol (PROVENTIL) (2.5 MG/3ML) 0.083% nebulizer solution, Take 3 mLs (2.5 mg total) by nebulization every 6 (six) hours as needed for shortness of breath or wheezing., Disp: 75 mL, Rfl: 2 ?  albuterol (VENTOLIN HFA) 108 (90 Base) MCG/ACT  inhaler, INHALE 1 TO 2 PUFFS BY MOUTH EVERY 6 HOURS AS NEEDED FOR WHEEZING OR SHORTNESS OF BREATH, Disp: 18 g, Rfl: 2 ?  amiodarone (PACERONE) 200 MG tablet, Take 1 tablet (200 mg total) by mouth daily., Disp: 30 tablet, Rfl: 3 ?  aspirin EC 81 MG tablet, Take 1 tablet (81 mg total) by mouth daily. Swallow whole., Disp: 30 tablet, Rfl: 2 ?  bismuth subsalicylate (PEPTO BISMOL) 262 MG/15ML suspension, Take 30 mLs by mouth every 6 (six) hours as needed for indigestion., Disp: , Rfl:  ?  Blood Pressure Monitor DEVI, Please use to monitor blood pressure as directed.  ICD: I10 (hypertension) (Patient taking differently: Please use to monitor blood pressure as directed.  ICD: I10 (hypertension)), Disp: 1 each, Rfl: 0 ?  digoxin (LANOXIN) 0.125 MG tablet, TAKE 1 TABLET (0.125 MG TOTAL) BY MOUTH DAILY., Disp: 90 tablet, Rfl: 1 ?  empagliflozin (JARDIANCE) 10 MG TABS tablet, Take 1 tablet (10 mg total) by mouth daily., Disp: 30 tablet, Rfl: 6 ?  rosuvastatin (CRESTOR) 5 MG tablet, TAKE 1 TABLET (5 MG TOTAL) BY MOUTH DAILY., Disp: 90 tablet, Rfl: 3 ?  spironolactone (ALDACTONE) 25 MG tablet, Take 0.5 tablets (12.5 mg total) by mouth daily., Disp: 15 tablet, Rfl: 2 ?  tamsulosin (FLOMAX) 0.4 MG CAPS capsule, TAKE ONE CAPSULE BY MOUTH ONCE DAILY AFTER SUPPER, Disp: 30 capsule, Rfl: 2 ?  torsemide (DEMADEX) 20 MG tablet, Take 1 tablet (20 mg total) by mouth daily., Disp: 30 tablet, Rfl: 2 ?No Known Allergies ? ? ?Social History  ? ?Socioeconomic History  ?  Marital status: Married  ?  Spouse name: Not on file  ? Number of children: Not on file  ? Years of education: Not on file  ? Highest education level: Not on file  ?Occupational History  ? Occupation: Engineer, manufacturing systems  ?  Comment: has not been able to work steadily for the last year or two  ?Tobacco Use  ? Smoking status: Every Day  ?  Packs/day: 0.10  ?  Years: 48.00  ?  Pack years: 4.80  ?  Types: Cigarettes  ? Smokeless tobacco: Never  ? Tobacco comments:  ?  cutting back 2   per day  ?Vaping Use  ? Vaping Use: Never used  ?Substance and Sexual Activity  ? Alcohol use: Yes  ?  Alcohol/week: 1.0 standard drink  ?  Types: 1 Cans of beer per week  ?  Comment: occasionally  ? Drug use: Yes  ?  Types: Marijuana  ?  Comment: daily 4 times last 2 weeks ago as of 05/31/2019  ? Sexual activity: Yes  ?  Birth control/protection: None  ?Other Topics Concern  ? Not on file  ?Social History Narrative  ? Not on file  ? ?Social Determinants of Health  ? ?Financial Resource Strain: Low Risk   ? Difficulty of Paying Living Expenses: Not very hard  ?Food Insecurity: No Food Insecurity  ? Worried About Charity fundraiser in the Last Year: Never true  ? Ran Out of Food in the Last Year: Never true  ?Transportation Needs: No Transportation Needs  ? Lack of Transportation (Medical): No  ? Lack of Transportation (Non-Medical): No  ?Physical Activity: Not on file  ?Stress: Not on file  ?Social Connections: Not on file  ?Intimate Partner Violence: Not on file  ? ? ?Physical Exam ?Vitals reviewed.  ?Constitutional:   ?   Appearance: Normal appearance. He is normal weight.  ?HENT:  ?   Head: Normocephalic.  ?   Nose: Nose normal.  ?   Mouth/Throat:  ?   Mouth: Mucous membranes are moist.  ?   Pharynx: Oropharynx is clear.  ?Eyes:  ?   Conjunctiva/sclera: Conjunctivae normal.  ?   Pupils: Pupils are equal, round, and reactive to light.  ?Cardiovascular:  ?   Rate and Rhythm: Normal rate and regular rhythm.  ?   Pulses: Normal pulses.  ?   Heart sounds: Normal heart sounds.  ?Pulmonary:  ?   Effort: Pulmonary effort is normal.  ?   Breath sounds: Normal breath sounds.  ?Abdominal:  ?   General: Abdomen is flat.  ?   Palpations: Abdomen is soft.  ?Musculoskeletal:     ?   General: No swelling. Normal range of motion.  ?   Cervical back: Normal range of motion.  ?   Right lower leg: No edema.  ?   Left lower leg: No edema.  ?Skin: ?   General: Skin is warm and dry.  ?   Capillary Refill: Capillary refill takes less  than 2 seconds.  ?Neurological:  ?   General: No focal deficit present.  ?   Mental Status: He is alert. Mental status is at baseline.  ?Psychiatric:     ?   Mood and Affect: Mood normal.  ? ? ? ? ? ? ?Future Appointments  ?Date Time Provider [place]  ?11/29/2021 10:15 AM Harvie Heck, MD [facility]  ?12/11/2021 11:00 AM [facility] PHARMACY [facility] None  ?01/01/2022 10:00 AM [facility] PA/NP [facility] None  ? ? ? ?  ACTION: ?Home visit completed ? ? ? ? ? ? ?

## 2021-11-28 NOTE — Progress Notes (Signed)
CT scan is normal. No need for follow up.

## 2021-11-29 ENCOUNTER — Encounter: Payer: Medicare (Managed Care) | Admitting: Internal Medicine

## 2021-12-04 ENCOUNTER — Other Ambulatory Visit (HOSPITAL_COMMUNITY): Payer: Self-pay

## 2021-12-04 ENCOUNTER — Encounter: Payer: Medicare (Managed Care) | Admitting: Internal Medicine

## 2021-12-04 NOTE — Progress Notes (Signed)
?  Attempted to visit Edward Mullen today however he canceled and reported he would call me back to reschedule but did not. I will reach out again to set up home visit.  ? ? ?ACTION: ?Next call planned for 4/26 ? ? ? ? ? ? ?

## 2021-12-10 ENCOUNTER — Telehealth (HOSPITAL_COMMUNITY): Payer: Self-pay

## 2021-12-10 NOTE — Telephone Encounter (Signed)
Called to remind Edward Mullen of his appointment in clinic tomorrow at 11:00. He confirmed and reports he is working on finding a ride as his daughters car broke down. He advised he will update me once he determines if he found one or not first thing in the morning. I will continue to follow up.  ?

## 2021-12-11 ENCOUNTER — Inpatient Hospital Stay (HOSPITAL_COMMUNITY): Admission: RE | Admit: 2021-12-11 | Payer: Medicare (Managed Care) | Source: Ambulatory Visit

## 2021-12-11 ENCOUNTER — Other Ambulatory Visit (HOSPITAL_COMMUNITY): Payer: Self-pay

## 2021-12-11 NOTE — Progress Notes (Signed)
Paramedicine Encounter ? ? ? Patient ID: Edward Mullen, male    DOB: 1956/01/29, 66 y.o.   MRN: [id number] ? ? ?Arrived for home visit for Edward Mullen who reports he is feeling a little congested today. He admits he has been smoking over the last few days and has not been using his nebulizers/breathing treatments. I advised him the importance of smoking cessation and using his breathing treatments daily. He agreed with plan and verbalized understanding.  ? ? ?He also reports having passed two blood clots in his urine today this morning around 0900. He described them at small clots, dark red in color. He says he has urinated since then with no problems and no pain associated with this. He denied any recent penile trauma, pain or swelling. He denied any foul odor urine or darkened urine. I advised him to make note if happens again and to call me. He agreed.  ? ?Vitals and assessment obtained.  ?No lower leg swelling. Lungs noted to have some wheezing throughout. We discussed smoking cessation and nebulizer usage.  ? ?Meds verified and confirmed. Pill box filled for one week.  ? ?He denied any Life Vest alerts or shocks.  ? ?We rescheduled pharmacy visit for Monday at 3:00. He agreed with this and I provided him medicaid transport number to utilize he was grateful as his daughters truck he normally uses is broke down.  ? ?I reminded him to adhere to med compliance, appointments and diet. He agreed. Home visit complete.  ? ?Refills: NONE ? ?Patient Care Team: ?Harvie Heck, MD as PCP - General ?Drema Pry as Social Worker ? ?Patient Active Problem List  ? Diagnosis Date Noted  ? Type 2 diabetes mellitus with stage 3b chronic kidney disease, without long-term current use of insulin ([place]) 10/05/2021  ? Hemoptysis   ? Acute exacerbation of CHF (congestive heart failure) ([place]) 10/04/2021  ? Urinary retention   ? Scrotal mass 08/01/2020  ? Nocturia 08/01/2020  ? Lumbar spinal stenosis 06/08/2019  ? Healthcare maintenance  05/11/2019  ? Housing problems 05/11/2019  ? Difficulty urinating 04/07/2018  ? Colon cancer screening 04/07/2018  ? Anemia 12/19/2016  ? CKD (chronic kidney disease) stage 3, GFR 30-59 ml/min ([facility]) 12/13/2016  ? Acute on chronic systolic heart failure ([place]) 09/06/2016  ? Non-ischemic cardiomyopathy ([place]) 08/27/2016  ? 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 Mitral regurgitation 08/27/2016  ? Polysubstance abuse ([place])   ? Tobacco abuse 09/19/2014  ? HTN (hypertension) 03/28/2011  ? Bilateral lower extremity pain 03/28/2011  ? ? ?Current Outpatient Medications:  ?  albuterol (PROVENTIL) (2.5 MG/3ML) 0.083% nebulizer solution, Take 3 mLs (2.5 mg total) by nebulization every 6 (six) hours as needed for shortness of breath or wheezing., Disp: 75 mL, Rfl: 2 ?  albuterol (VENTOLIN HFA) 108 (90 Base) MCG/ACT inhaler, INHALE 1 TO 2 PUFFS BY MOUTH EVERY 6 HOURS AS NEEDED FOR WHEEZING OR SHORTNESS OF BREATH, Disp: 18 g, Rfl: 2 ?  amiodarone (PACERONE) 200 MG tablet, Take 1 tablet (200 mg total) by mouth daily., Disp: 30 tablet, Rfl: 3 ?  aspirin EC 81 MG tablet, Take 1 tablet (81 mg total) by mouth daily. Swallow whole., Disp: 30 tablet, Rfl: 2 ?  bismuth subsalicylate (PEPTO BISMOL) 262 MG/15ML suspension, Take 30 mLs by mouth every 6 (six) hours as needed for indigestion., Disp: , Rfl:  ?  Blood Pressure Monitor DEVI, Please use to monitor blood pressure as directed.  ICD: I10 (hypertension) (Patient taking differently: Please  use to monitor blood pressure as directed.  ICD: I10 (hypertension)), Disp: 1 each, Rfl: 0 ?  digoxin (LANOXIN) 0.125 MG tablet, TAKE 1 TABLET (0.125 MG TOTAL) BY MOUTH DAILY., Disp: 90 tablet, Rfl: 1 ?  empagliflozin (JARDIANCE) 10 MG TABS tablet, Take 1 tablet (10 mg total) by mouth daily., Disp: 30 tablet, Rfl: 6 ?  rosuvastatin (CRESTOR) 5 MG tablet, TAKE 1 TABLET (5 MG TOTAL) BY MOUTH DAILY., Disp: 90 tablet, Rfl: 3 ?  spironolactone (ALDACTONE) 25 MG tablet, Take 0.5  tablets (12.5 mg total) by mouth daily., Disp: 15 tablet, Rfl: 2 ?  tamsulosin (FLOMAX) 0.4 MG CAPS capsule, TAKE ONE CAPSULE BY MOUTH ONCE DAILY AFTER SUPPER, Disp: 30 capsule, Rfl: 2 ?  torsemide (DEMADEX) 20 MG tablet, Take 1 tablet (20 mg total) by mouth daily., Disp: 30 tablet, Rfl: 2 ?No Known Allergies ? ? ?Social History  ? ?Socioeconomic History  ? Marital status: Married  ?  Spouse name: Not on file  ? Number of children: Not on file  ? Years of education: Not on file  ? Highest education level: Not on file  ?Occupational History  ? Occupation: Engineer, manufacturing systems  ?  Comment: has not been able to work steadily for the last year or two  ?Tobacco Use  ? Smoking status: Every Day  ?  Packs/day: 0.10  ?  Years: 48.00  ?  Pack years: 4.80  ?  Types: Cigarettes  ? Smokeless tobacco: Never  ? Tobacco comments:  ?  cutting back 2  per day  ?Vaping Use  ? Vaping Use: Never used  ?Substance and Sexual Activity  ? Alcohol use: Yes  ?  Alcohol/week: 1.0 standard drink  ?  Types: 1 Cans of beer per week  ?  Comment: occasionally  ? Drug use: Yes  ?  Types: Marijuana  ?  Comment: daily 4 times last 2 weeks ago as of 05/31/2019  ? Sexual activity: Yes  ?  Birth control/protection: None  ?Other Topics Concern  ? Not on file  ?Social History Narrative  ? Not on file  ? ?Social Determinants of Health  ? ?Financial Resource Strain: Low Risk   ? Difficulty of Paying Living Expenses: Not very hard  ?Food Insecurity: No Food Insecurity  ? Worried About Charity fundraiser in the Last Year: Never true  ? Ran Out of Food in the Last Year: Never true  ?Transportation Needs: No Transportation Needs  ? Lack of Transportation (Medical): No  ? Lack of Transportation (Non-Medical): No  ?Physical Activity: Not on file  ?Stress: Not on file  ?Social Connections: Not on file  ?Intimate Partner Violence: Not on file  ? ? ?Physical Exam ?Vitals reviewed.  ?Constitutional:   ?   Appearance: Normal appearance. He is normal weight.  ?HENT:  ?    Head: Normocephalic.  ?   Nose: Nose normal.  ?   Mouth/Throat:  ?   Mouth: Mucous membranes are moist.  ?   Pharynx: Oropharynx is clear.  ?Eyes:  ?   Conjunctiva/sclera: Conjunctivae normal.  ?   Pupils: Pupils are equal, round, and reactive to light.  ?Cardiovascular:  ?   Rate and Rhythm: Normal rate and regular rhythm.  ?   Pulses: Normal pulses.  ?   Heart sounds: Normal heart sounds.  ?Pulmonary:  ?   Effort: Pulmonary effort is normal.  ?   Breath sounds: Wheezing present.  ?Abdominal:  ?   General: Abdomen is flat.  ?  Palpations: Abdomen is soft.  ?Musculoskeletal:     ?   General: No swelling. Normal range of motion.  ?   Cervical back: Normal range of motion.  ?   Right lower leg: No edema.  ?   Left lower leg: No edema.  ?Skin: ?   General: Skin is warm and dry.  ?   Capillary Refill: Capillary refill takes less than 2 seconds.  ?Neurological:  ?   General: No focal deficit present.  ?   Mental Status: He is alert. Mental status is at baseline.  ?Psychiatric:     ?   Mood and Affect: Mood normal.  ? ? ? ? ? ? ?Future Appointments  ?Date Time Provider [place]  ?12/17/2021  3:00 PM [facility] PHARMACY [facility] None  ?01/01/2022 10:00 AM [facility] PA/NP [facility] None  ? ? ? ?ACTION: ?Home visit completed ? ? ? ? ? ? ?

## 2021-12-14 NOTE — Progress Notes (Incomplete)
***In Progress*** ? ?  ?Advanced Heart Failure Clinic Note  ?Primary Care: Harvie Heck, MD ?HF Cardiologist: Dr. Aundra Dubin  ? ?HPI:  ?Edward Mullen is a 66 y.o. male with history of HTN, DM, chronic systolic CHF, and polysubstance abuse.  ? ?Admitted 08/23/2016 with acute systolic CHF in setting of substance abuse. Echo was done, showing EF 15-20%.  Diuresed with IV lasix and meds adjusted as tolerated.  UDS + for cocaine on admission. Cardiac cath showed coronary disease but not significant enough to explain cardiomyopathy. Down 23 lbs from admission weight with diuresis.  Discharge weight 193 lbs. ?  ?He was positive for cocaine again in 09/2016.   ?  ?He was admitted with syncope in 12/2016 after using cocaine.  He was noted to have NSVT on telemetry.  Syncope suspected due to VT, no ICD given active substance abuse but had Lifevest placed. EF 50% on 03/2017 echo, so Lifevest subsequently removed. ?  ?He started using cocaine again and stopped his cardiac medications. In 11/2020, he was admitted with acute on chronic systolic CHF.  Frequent NSVT was noted and amiodarone was started.  He had echo showing EF 20-25% with normal RV.  LHC showed 80% PLV stenosis, managed medically.  ?  ?Lost to f/u in the [facility]. Not seen back since 12/2020. ?  ?Admitted 10/2021 with a/c CHF and PNA. Echo with EF 20-25%, RV mildly reduced. Started on milrinone and IV lasix. RHC showed mildly elevated PCWP and LVEDP, CI 1.9. Milrinone weaned off and GDMT titrated. Had NSVT/VT, discharged home with LifeVest, weight 202 lbs. ?  ?PCP had held his spironolactone and losartan due to elevated kidney function.  ? ?He returns for post hospital HF follow up with paramedicne on 11/13/2021. He stated he has good days and bad, and that he gets SOB with fast walking. He was trying to walk outside more for exercise. Overall he was feeling fine. Denied abnormal bleeding, palpitations, chest pain, dizziness, edema, PND or orthopnea. His appetite had been ok.  His weight at home had been 199-202 pounds. He was taking all his medications, but had last used cocaine 2 weeks ago. He was still smoking THC daily. ? ?Today he returns to HF clinic for pharmacist medication titration. At last visit with NP spironolactone 12.5 mg daily was re-initiated and torsemide was decreased from 40 mg to 20 mg daily.  ? ?Overall feeling ***. ?Dizziness, lightheadedness, fatigue:  ?Chest pain or palpitations: ? ?How is your breathing?: *** ?SOB: ?Able to complete all ADLs. Activity level *** ? ?Weight at home pounds. Takes furosemide/torsemide/bumex *** mg *** daily.  ?LEE ?PND/Orthopnea ? ?Appetite *** ?Low-salt diet:  ? ?Physical Exam ?Cost/affordability of meds  ? ? ?HF Medications: ?Spironolactone 12.5 mg daily ?Jardiance 10 mg daily ?Digoxin 0.125 mg daily ?Torsemide 20 mg daily ? ?Has the patient been experiencing any side effects to the medications prescribed?  {YES NO:22349} ? ?Does the patient have any problems obtaining medications due to transportation or finances?   {YES NO:22349} ? ?Understanding of regimen: {excellent/good/fair/poor:19665} ?Understanding of indications: {excellent/good/fair/poor:19665} ?Potential of compliance: {excellent/good/fair/poor:19665} ?Patient understands to avoid NSAIDs. ?Patient understands to avoid decongestants. ?  ? ?Pertinent Lab Values: ?Labs 11/13/2021: Serum creatinine 1.63, BUN 28, Potassium 3.5, Sodium 139, BNP 2745, Digoxin 0.7  ?Labs 11/21/2021: Serum creatine 1.60, BUN 24, Potassium 4.3, Sodium 139 ? ?Vital Signs: ?Weight: *** (last clinic weight: 199.4 lbs) ?Blood pressure: *** 110/70 ?Heart rate: *** 74, 60-70s, last check 50 ? ? ?Digoxin goals -  level 0.7 ? ?Plan ?No labs today ?A. Losartan 12.5 mg daily - bmet 1-2 weeks at APP ?Harvie Heck to 25 daily - bmet 1 week ?F/u below ? ?Assessment/Plan: ?1. Chronic Biventricular Systolic Heart Failure:  EF 15-20% with moderate to severe MR on echo 08/2016.  Nonischemic cardiomyopathy. HTN vs cocaine  use vs viral vs ETOH. HIV negative, SPEP with no M-spike.  Patient has been noted to have CAD but not enough to explain cardiomyopathy (Cath this admission with stable 80% PLV stenosis).  Echo in 03/2017 showed EF up to 50%. However, he stopped his meds and started cocaine again, echo in 11/2020 with EF back down to 20-25% => suspect cocaine abuse may be the primary etiology of cardiomyopathy. Poor compliance with medications and follow-up in the [facility].  Echo (09/2021) with EF 20-25%, RV mildly reduced. RHC (10/2021) with mildly elevated PCWP and LVEDP, CI 1.9.  ?Stable NYHA II symptoms, he is not volume overloaded today. GDMT limited to CKD. *** ?- Decrease torsemide to 20 mg daily. ?- Continue spironolactone 12.5 mg daily. ?- Continue Jardiance 10 mg daily. ?- Continue digoxin 0.125 mg daily. ?- Continue to hold losartan until repeat labs. ?- no ? blocker w/ low output  ?- Continue LifeVest due to frequent runs of VT/ NSVT during admission. ?- Not a candidate for advanced therapies including home inotropes given h/o polysubstance use and poor compliance  ?- Continue paramedicine. ?- If he can stay off cocaine, should be CRT-D candidate with wide LBBB.  ?2. VT/NSVT: Multiple runs this admit, longest was 28 beats. Coronary angiography showed no change in coronaries.  ?- Continue LifeVest. ?- Continue amiodarone 200 mg daily. ?3. CKD III: Baseline SCr 1.6.  ?- Follow renal function closely with addition of spiro. ?4. Hemoptysis: CT w/contrast not suggestive of PE, no evidence of nodules/masses.   ?- Pulmonary following, now resolved. ?5. CAD: 80% stenosis PLV on cath this admission.  No chest pain. ?- Continue statin.  ?- Restart ASA now that hemoptysis resolved. ?- off ? blocker w/ low output.  ?6. Cocaine abuse: Last used 2 weeks ago.  ?- Discussed cessation. ?  ?Follow up 01/01/2022 with APP ? ?[name], PharmD, BCPS, BCCP, CPP ?Heart Failure Clinic Pharmacist ?[phone] ?  ?

## 2021-12-17 ENCOUNTER — Other Ambulatory Visit (HOSPITAL_COMMUNITY): Payer: Self-pay

## 2021-12-17 ENCOUNTER — Telehealth (HOSPITAL_COMMUNITY): Payer: Self-pay | Admitting: Licensed Clinical Social Worker

## 2021-12-17 ENCOUNTER — Ambulatory Visit (HOSPITAL_COMMUNITY)
Admission: RE | Admit: 2021-12-17 | Discharge: 2021-12-17 | Disposition: A | Discharge status: Home / Self Care | Payer: Medicare (Managed Care) | Source: Ambulatory Visit | Attending: Internal Medicine | Admitting: Internal Medicine

## 2021-12-17 ENCOUNTER — Encounter (HOSPITAL_COMMUNITY): Payer: Self-pay

## 2021-12-17 VITALS — BP 138/90 | HR 69 | Wt 203.2 lb

## 2021-12-17 DIAGNOSIS — F17210 Nicotine dependence, cigarettes, uncomplicated: Secondary | ICD-10-CM | POA: Diagnosis not present

## 2021-12-17 DIAGNOSIS — I2510 Atherosclerotic heart disease of native coronary artery without angina pectoris: Secondary | ICD-10-CM | POA: Insufficient documentation

## 2021-12-17 DIAGNOSIS — R042 Hemoptysis: Secondary | ICD-10-CM | POA: Diagnosis not present

## 2021-12-17 DIAGNOSIS — F1910 Other psychoactive substance abuse, uncomplicated: Secondary | ICD-10-CM | POA: Diagnosis not present

## 2021-12-17 DIAGNOSIS — N183 Chronic kidney disease, stage 3 unspecified: Secondary | ICD-10-CM | POA: Insufficient documentation

## 2021-12-17 DIAGNOSIS — E119 Type 2 diabetes mellitus without complications: Secondary | ICD-10-CM | POA: Diagnosis not present

## 2021-12-17 DIAGNOSIS — I252 Old myocardial infarction: Secondary | ICD-10-CM | POA: Diagnosis not present

## 2021-12-17 DIAGNOSIS — I110 Hypertensive heart disease with heart failure: Secondary | ICD-10-CM | POA: Insufficient documentation

## 2021-12-17 DIAGNOSIS — Z7984 Long term (current) use of oral hypoglycemic drugs: Secondary | ICD-10-CM | POA: Diagnosis not present

## 2021-12-17 DIAGNOSIS — I5082 Biventricular heart failure: Secondary | ICD-10-CM | POA: Insufficient documentation

## 2021-12-17 DIAGNOSIS — I5022 Chronic systolic (congestive) heart failure: Secondary | ICD-10-CM

## 2021-12-17 DIAGNOSIS — Z79899 Other long term (current) drug therapy: Secondary | ICD-10-CM | POA: Diagnosis not present

## 2021-12-17 MED ORDER — SPIRONOLACTONE 25 MG PO TABS: 12.5000 mg | ORAL_TABLET | Freq: Every day | ORAL | 3 refills | Status: DC

## 2021-12-17 MED ORDER — LOSARTAN POTASSIUM 25 MG PO TABS: 12.5000 mg | ORAL_TABLET | Freq: Every day | ORAL | 3 refills | Status: DC

## 2021-12-17 NOTE — Progress Notes (Signed)
?Advanced Heart Failure Clinic Note  ? ?Primary Care: Harvie Heck, MD ?HF Cardiologist: Dr. Aundra Dubin  ? ?HPI:  ?Edward Mullen is a 66 y.o. male with history of HTN, DM, chronic systolic CHF, and polysubstance abuse.  ? ?Admitted 08/23/2016 with acute systolic CHF in setting of substance abuse. Echo was done, showing EF 15-20%.  Diuresed with IV lasix and meds adjusted as tolerated.  UDS + for cocaine on admission. Cardiac cath showed coronary disease but not significant enough to explain cardiomyopathy. Down 23 lbs from admission weight with diuresis.  Discharge weight 193 lbs. ?  ?He was positive for cocaine again in 09/2016.   ?  ?He was admitted with syncope in 12/2016 after using cocaine.  He was noted to have NSVT on telemetry.  Syncope suspected due to VT, no ICD given active substance abuse but had Lifevest placed. EF 50% on 03/2017 echo, so Lifevest subsequently removed. ?  ?He started using cocaine again and stopped his cardiac medications. In 11/2020, he was admitted with acute on chronic systolic CHF.  Frequent NSVT was noted and amiodarone was started.  He had echo showing EF 20-25% with normal RV.  LHC showed 80% PLV stenosis, managed medically.  ?  ?Lost to f/u in the [facility]. Not seen back since 12/2020. ?  ?Admitted 10/2021 with a/c CHF and PNA. Echo with EF 20-25%, RV mildly reduced. Started on milrinone and IV lasix. RHC showed mildly elevated PCWP and LVEDP, CI 1.9. Milrinone weaned off and GDMT titrated. Had NSVT/VT, discharged home with LifeVest, weight 202 lbs. ?  ?PCP had held his spironolactone and losartan due to elevated kidney function in 10/2021.  ? ?He returned for post hospital HF follow up with paramedic on 11/13/2021. He stated he has good days and bad, and that he gets SOB with fast walking. He was trying to walk outside more for exercise. Overall he was feeling fine. Denied abnormal bleeding, palpitations, chest pain, dizziness, edema, PND or orthopnea. His appetite had been ok. His weight  at home had been 199-202 pounds. He was taking all his medications, but had last used cocaine 2 weeks prior. He was still smoking THC daily. ? ?Today he returns to HF clinic for pharmacist medication titration. At last visit with NP on 4/4, spironolactone 12.5 mg daily was re-initiated and torsemide was decreased from 40 mg to 20 mg daily.  ? ?Overall he is feeling pretty good today. He tells me he felt a pain on his side yesterday, but denies chest pain, palpitations, dizziness, lightheadedness or fatigue. Also denies any Lifevest events. He states his breathing has been alright, but he has started smoking again as of last week, about 6 cigarettes a week. Since then he has been using his albuterol twice daily which has helped his breathing and is trying to cut back on substances. He denies use of cocaine since last visit, but continues to smoke THC daily. He walks with a cane at baseline to help with his balance and is able to complete all ADLs. He sometimes get SOB when completing ADLs, but is able to sit down for 5-10 minutes and then he is okay to continue. His BP was elevated today in clinic at 138/90 mmHg, but he did not take his medications yet today. Per paramedic, Nira Conn, his BP is typically 423 mmHg systolic when she checks, but that is after he takes his medications. His weight at home has been stable at 197 lbs. He takes torsemide 20 mg daily and  has not needed any extra. No LEE on exam. He denies PND or orthopnea. His appetite has been very good. He is eating lots of veggies, avoiding fried foods, and his daughter has been cooking for him. He avoids adding salt to his food and watches his fluid intake.  ? ?HF Medications: ?Spironolactone 12.5 mg daily ?Jardiance 10 mg daily ?Digoxin 0.125 mg daily ?Torsemide 20 mg daily ? ?Has the patient been experiencing any side effects to the medications prescribed? No ? ?Does the patient have any problems obtaining medications due to transportation or finances?  No, [place] Medicaid ? ?Understanding of regimen: poor ?Understanding of indications: poor ?Potential of compliance: excellent, paramedicine ?Patient understands to avoid NSAIDs. ?Patient understands to avoid decongestants. ?  ? ?Pertinent Lab Values: ?Labs 11/13/2021: Serum creatinine 1.63, BUN 28, Potassium 3.5, Sodium 139, BNP 2745, Digoxin 0.7  ?Labs 11/21/2021: Serum creatine 1.60, BUN 24, Potassium 4.3, Sodium 139 ? ?Vital Signs: ?Weight: 203.2 lbs (last clinic weight: 199.4 lbs) ?Blood pressure: 138/90 mmHg  ?Heart rate: 69 bpm ? ?Assessment/Plan: ?1. Chronic Biventricular Systolic Heart Failure:  EF 15-20% with moderate to severe MR on echo 08/2016.  Nonischemic cardiomyopathy. HTN vs cocaine use vs viral vs ETOH. HIV negative, SPEP with no M-spike.  Patient has been noted to have CAD but not enough to explain cardiomyopathy (Cath this admission with stable 80% PLV stenosis).  Echo in 03/2017 showed EF up to 50%. However, he stopped his meds and started cocaine again, echo in 11/2020 with EF back down to 20-25% => suspect cocaine abuse may be the primary etiology of cardiomyopathy. Poor compliance with medications and follow-up in the [facility].  Echo (09/2021) with EF 20-25%, RV mildly reduced. RHC (10/2021) with mildly elevated PCWP and LVEDP, CI 1.9.  ?Stable NYHA II symptoms, he is not volume overloaded today. GDMT limited to CKD. ?- Continue torsemide 20 mg daily. ?- Restart losartan 12.5 mg daily - BMET in 2 weeks at APP appointment ?- Continue spironolactone 12.5 mg daily. ?- Continue Jardiance 10 mg daily. ?- Continue digoxin 0.125 mg daily. ?- no ? blocker - if able to add in the future, would prefer carvedilol with ongoing cocaine use (unopposed alpha) ?- Continue LifeVest due to frequent runs of VT/ NSVT during admission. ?- Not a candidate for advanced therapies including home inotropes given h/o polysubstance use and poor compliance  ?- Continue paramedicine. ?- If he is able to stay off cocaine, should be CRT-D  candidate with wide LBBB.  ?2. VT/NSVT: Multiple runs this admit, longest was 28 beats. Coronary angiography showed no change in coronaries.  ?- Continue LifeVest. ?- Continue amiodarone 200 mg daily. ?3. CKD III: Baseline SCr 1.6.  ?- Follow renal function closely with addition of losartan. ?4. Hemoptysis: CT w/contrast not suggestive of PE, no evidence of nodules/masses.   ?- Pulmonary following, now resolved. ?5. CAD: 80% stenosis PLV on cath this admission.  No chest pain. ?- Continue statin.  ?- Restart ASA now that hemoptysis resolved. - paramedicine will pick-up some from the pharmacy when she gets his losartan ?- off ? blocker w/ low output on RHC.  ?6. Cocaine abuse: Last used 4 weeks ago.  ?- Encouraged continued abstinence. ?  ?Follow up 01/01/2022 with APP ? ?Cathrine Muster, PharmD, BCPS ?PGY2 Cardiology Pharmacy Resident ? ?Kerby Nora, PharmD, BCPS ?Heart Failure Clinic Pharmacist ?[phone] ?  ?

## 2021-12-17 NOTE — Progress Notes (Signed)
Paramedicine Encounter ? ? ? Patient ID: Edward Mullen, male    DOB: 04-26-1956, 66 y.o.   MRN: [id number] ? ?Met with Edward Mullen in clinic today where he was seen by Pharmacy resident Delsa Sale. Edward Mullen reports last night he had an episode of mild chest pain and it went away moments later. He denied any shortness of breath, dizziness or swelling.  ? ?Vitals were obtained by pharmacy student. Meds were verified and confirmed.  ? ?Med changes: ?-Add back Losartan 12.69m  ?-Start Aspirin 89m ? ?I filled pill box accordingly for one week minus aspirin. I advised Edward Mullen pick up some OTC. He agreed.  ? ?We plan to meet in one week. He has APP clinic follow up in a few weeks and will get a repeat BMET.  ? ?Visit complete.  ? ? ? ?ACTION: ?Next visit planned for one week ? ? ? ? ? ? ?

## 2021-12-17 NOTE — Patient Instructions (Addendum)
It was a pleasure seeing you today! ? ?MEDICATIONS: ?-We are changing your medications today ?-Start losartan 12.5 mg (1/2 tablet) daily ?-Start aspirin 81 mg daily ?-Call if you have questions about your medications. ? ?NEXT APPOINTMENT: ?Return to clinic in 01/01/22 with APP. ? ?In general, to take care of your heart failure: ?-Limit your fluid intake to 2 Liters (half-gallon) per day.   ?-Limit your salt intake to ideally 2-3 grams (2000-3000 mg) per day. ?-Weigh yourself daily and record, and bring that "weight diary" to your next appointment.  (Weight gain of 2-3 pounds in 1 day typically means fluid weight.) ?-The medications for your heart are to help your heart and help you live longer.   ?-Please contact us before stopping any of your heart medications. ? ?Call the clinic at [phone] with questions or to reschedule future appointments.  ?

## 2021-12-19 NOTE — Telephone Encounter (Signed)
CSW consulted to help get pt to clinic visit after transport arranged through insurance fell through.  Able to assist with taxi voucher. ? ?[name], LCSW ?Clinical Social Worker ?Advanced Heart Failure Clinic ?Desk#: [phone] ?Cell#: [phone] ? ?

## 2021-12-26 ENCOUNTER — Other Ambulatory Visit (HOSPITAL_COMMUNITY): Payer: Self-pay

## 2021-12-26 NOTE — Progress Notes (Signed)
Paramedicine Encounter ? ? ? Patient ID: Edward Mullen, male    DOB: [birth year], 66 y.o.   MRN: [id number] ? ? ?Arrived for home visit for Edward Mullen who reports feeling okay today however he reports Monday night around 9:00pm he was laying on the couch sleeping and witnessed by family he had what appeared to be a seizure with full body tremors lasting about 2 to 3 minuets where he was not responding to his family members who were trying to communicate with him and he also urinated on himself. He described what was a post-ictal state for 15-20 mins following this episode. His daughter and grandsons witnessed this and explained it in detail and reports they did not call 911 and he did not seek medical attention for this. We also reports he was not wearing his LifeVest at the time of this incident as he was washing the vest. He denied any headaches, changes in eating habits or issues with medications. He has been compliant with his morning meds but has missed several doses of his Flomax and Crestor.  I set up a PCP follow up for tomorrow at 915 and expressed the importance of being at this appointment. He agreed. I also advised him if he had any symptoms to reach out and or call 911 or go to the nearest ER. He agreed with plans.  ? ?He denied chest pain, dizziness or shortness of breath today. No lower leg edema noted. Lung sounds clear on assessment.  ? ?I reviewed medications and filled pill box accordingly for one week. We reviewed appointments and confirmed same. I will meet him in [facility] clinic next week on Tuesday. I provided him the number for [employer]. He reports he will set up his ride for next week and have family take him tomorrow.  ? ?Home visit complete.  ? ?CBG checked today despite him being diabetic- 86  ? ?Refills:  ?Spironolactone  ? ? ? ? ? ?Patient Care Team: ?Harvie Heck, MD as PCP - General ?Drema Pry as Social Worker ? ?Patient Active Problem List  ? Diagnosis Date Noted  ? Type  2 diabetes mellitus with stage 3b chronic kidney disease, without long-term current use of insulin ([place]) 10/05/2021  ? Hemoptysis   ? Acute exacerbation of CHF (congestive heart failure) ([place]) 10/04/2021  ? Urinary retention   ? Scrotal mass 08/01/2020  ? Nocturia 08/01/2020  ? Lumbar spinal stenosis 06/08/2019  ? Healthcare maintenance 05/11/2019  ? Housing problems 05/11/2019  ? Difficulty urinating 04/07/2018  ? Colon cancer screening 04/07/2018  ? Anemia 12/19/2016  ? CKD (chronic kidney disease) stage 3, GFR 30-59 ml/min (H[facility]) 12/13/2016  ? Acute on chronic systolic heart failure ([place]) 09/06/2016  ? Non-ischemic cardiomyopathy ([place]) 08/27/2016  ? 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 Mitral regurgitation 08/27/2016  ? Polysubstance abuse ([place])   ? Tobacco abuse 09/19/2014  ? HTN (hypertension) 03/28/2011  ? Bilateral lower extremity pain 03/28/2011  ? ? ?Current Outpatient Medications:  ?  albuterol (PROVENTIL) (2.5 MG/3ML) 0.083% nebulizer solution, Take 3 mLs (2.5 mg total) by nebulization every 6 (six) hours as needed for shortness of breath or wheezing., Disp: 75 mL, Rfl: 2 ?  albuterol (VENTOLIN HFA) 108 (90 Base) MCG/ACT inhaler, INHALE 1 TO 2 PUFFS BY MOUTH EVERY 6 HOURS AS NEEDED FOR WHEEZING OR SHORTNESS OF BREATH, Disp: 18 g, Rfl: 2 ?  amiodarone (PACERONE) 200 MG tablet, Take 1 tablet (200 mg total) by mouth daily., Disp: 30 tablet,  Rfl: 3 ?  aspirin EC 81 MG tablet, Take 1 tablet (81 mg total) by mouth daily. Swallow whole. (Patient not taking: Reported on 12/11/2021), Disp: 30 tablet, Rfl: 2 ?  bismuth subsalicylate (PEPTO BISMOL) 262 MG/15ML suspension, Take 30 mLs by mouth every 6 (six) hours as needed for indigestion. (Patient not taking: Reported on 12/17/2021), Disp: , Rfl:  ?  Blood Pressure Monitor DEVI, Please use to monitor blood pressure as directed.  ICD: I10 (hypertension) (Patient taking differently: Please use to monitor blood pressure as directed.  ICD: I10  (hypertension)), Disp: 1 each, Rfl: 0 ?  digoxin (LANOXIN) 0.125 MG tablet, TAKE 1 TABLET (0.125 MG TOTAL) BY MOUTH DAILY., Disp: 90 tablet, Rfl: 1 ?  empagliflozin (JARDIANCE) 10 MG TABS tablet, Take 1 tablet (10 mg total) by mouth daily., Disp: 30 tablet, Rfl: 6 ?  losartan (COZAAR) 25 MG tablet, Take 0.5 tablets (12.5 mg total) by mouth daily., Disp: 45 tablet, Rfl: 3 ?  rosuvastatin (CRESTOR) 5 MG tablet, TAKE 1 TABLET (5 MG TOTAL) BY MOUTH DAILY., Disp: 90 tablet, Rfl: 3 ?  spironolactone (ALDACTONE) 25 MG tablet, Take 0.5 tablets (12.5 mg total) by mouth daily., Disp: 45 tablet, Rfl: 3 ?  tamsulosin (FLOMAX) 0.4 MG CAPS capsule, TAKE ONE CAPSULE BY MOUTH ONCE DAILY AFTER SUPPER, Disp: 30 capsule, Rfl: 2 ?  torsemide (DEMADEX) 20 MG tablet, Take 1 tablet (20 mg total) by mouth daily., Disp: 30 tablet, Rfl: 2 ?No Known Allergies ? ? ?Social History  ? ?Socioeconomic History  ? Marital status: Married  ?  Spouse name: Not on file  ? Number of children: Not on file  ? Years of education: Not on file  ? Highest education level: Not on file  ?Occupational History  ? Occupation: Engineer, manufacturing systems  ?  Comment: has not been able to work steadily for the last year or two  ?Tobacco Use  ? Smoking status: Every Day  ?  Packs/day: 0.10  ?  Years: 48.00  ?  Pack years: 4.80  ?  Types: Cigarettes  ? Smokeless tobacco: Never  ? Tobacco comments:  ?  cutting back 2  per day  ?Vaping Use  ? Vaping Use: Never used  ?Substance and Sexual Activity  ? Alcohol use: Yes  ?  Alcohol/week: 1.0 standard drink  ?  Types: 1 Cans of beer per week  ?  Comment: occasionally  ? Drug use: Yes  ?  Types: Marijuana  ?  Comment: daily 4 times last 2 weeks ago as of 05/31/2019  ? Sexual activity: Yes  ?  Birth control/protection: None  ?Other Topics Concern  ? Not on file  ?Social History Narrative  ? Not on file  ? ?Social Determinants of Health  ? ?Financial Resource Strain: Low Risk   ? Difficulty of Paying Living Expenses: Not very hard  ?Food  Insecurity: No Food Insecurity  ? Worried About Charity fundraiser in the Last Year: Never true  ? Ran Out of Food in the Last Year: Never true  ?Transportation Needs: Unmet Transportation Needs  ? Lack of Transportation (Medical): Yes  ? Lack of Transportation (Non-Medical): No  ?Physical Activity: Not on file  ?Stress: Not on file  ?Social Connections: Not on file  ?Intimate Partner Violence: Not on file  ? ? ?Physical Exam ? ? ? ? ? ?Future Appointments  ?Date Time Provider [place]  ?01/01/2022 10:00 AM [facility] PA/NP [facility] None  ? ? ? ?ACTION: ?Home visit  completed ? ? ? ? ? ? ?

## 2021-12-27 ENCOUNTER — Encounter: Payer: Self-pay | Admitting: Student

## 2021-12-27 ENCOUNTER — Other Ambulatory Visit: Payer: Self-pay

## 2021-12-27 ENCOUNTER — Ambulatory Visit (INDEPENDENT_AMBULATORY_CARE_PROVIDER_SITE_OTHER): Payer: Medicare (Managed Care) | Admitting: Student

## 2021-12-27 VITALS — BP 115/77 | HR 64 | Temp 97.6°F | Ht 72.0 in | Wt 204.6 lb

## 2021-12-27 DIAGNOSIS — R569 Unspecified convulsions: Secondary | ICD-10-CM

## 2021-12-27 NOTE — Patient Instructions (Addendum)
Seizure like episode We will check some labs today If you have another episode please call the clinic or go the ED you many need further workup and brain imaging if you continue to have seizures   Per [facility] statutes, patients with seizures are not allowed to drive until they have been seizure-free for six months.   Use caution when using heavy equipment or power tools. Avoid working on ladders or at heights. Take showers instead of baths. Ensure the water temperature is not too high on the home water heater. Do not go swimming alone. Do not lock yourself in a room alone (i.e. bathroom). When caring for infants or small children, sit down when holding, feeding, or changing them to minimize risk of injury to the child in the event you have a seizure. Maintain good sleep hygiene. Avoid alcohol.    If patient has another seizure, call 911 and bring them back to the ED if: A.  The seizure lasts longer than 5 minutes.      B.  The patient doesn't wake shortly after the seizure or has new problems such as difficulty seeing, speaking or moving following the seizure C.  The patient was injured during the seizure D.  The patient has a temperature over 102 F (39C) E.  The patient vomited during the seizure and now is having trouble breathing

## 2021-12-28 DIAGNOSIS — R569 Unspecified convulsions: Secondary | ICD-10-CM | POA: Insufficient documentation

## 2021-12-28 LAB — CMP14 + ANION GAP
ALT: 12 IU/L (ref 0–44)
AST: 18 IU/L (ref 0–40)
Albumin/Globulin Ratio: 1.2 (ref 1.2–2.2)
Albumin: 4.1 g/dL (ref 3.8–4.8)
Alkaline Phosphatase: 160 IU/L — ABNORMAL HIGH (ref 44–121)
Anion Gap: 16 mmol/L (ref 10.0–18.0)
BUN/Creatinine Ratio: 14 (ref 10–24)
BUN: 24 mg/dL (ref 8–27)
Bilirubin Total: <0.2 mg/dL (ref 0.0–1.2)
CO2: 22 mmol/L (ref 20–29)
Calcium: 9.1 mg/dL (ref 8.6–10.2)
Chloride: 104 mmol/L (ref 96–106)
Creatinine, Ser: 1.69 mg/dL — ABNORMAL HIGH (ref 0.76–1.27)
Globulin, Total: 3.4 g/dL (ref 1.5–4.5)
Glucose: 74 mg/dL (ref 70–99)
Potassium: 4.2 mmol/L (ref 3.5–5.2)
Sodium: 142 mmol/L (ref 134–144)
Total Protein: 7.5 g/dL (ref 6.0–8.5)
eGFR: 44 mL/min/{1.73_m2} — ABNORMAL LOW (ref >59)

## 2021-12-28 LAB — CBC WITH DIFFERENTIAL/PLATELET
Basophils Absolute: 0.1 10*3/uL (ref 0.0–0.2)
Basos: 1 %
EOS (ABSOLUTE): 0.3 10*3/uL (ref 0.0–0.4)
Eos: 6 %
Hematocrit: 46.6 % (ref 37.5–51.0)
Hemoglobin: 15 g/dL (ref 13.0–17.7)
Immature Grans (Abs): 0 10*3/uL (ref 0.0–0.1)
Immature Granulocytes: 0 %
Lymphocytes Absolute: 1.3 10*3/uL (ref 0.7–3.1)
Lymphs: 24 %
MCH: 27.1 pg (ref 26.6–33.0)
MCHC: 32.2 g/dL (ref 31.5–35.7)
MCV: 84 fL (ref 79–97)
Monocytes Absolute: 1.1 10*3/uL — ABNORMAL HIGH (ref 0.1–0.9)
Monocytes: 20 %
Neutrophils Absolute: 2.7 10*3/uL (ref 1.4–7.0)
Neutrophils: 49 %
Platelets: 163 10*3/uL (ref 150–450)
RBC: 5.53 x10E6/uL (ref 4.14–5.80)
RDW: 15.1 % (ref 11.6–15.4)
WBC: 5.5 10*3/uL (ref 3.4–10.8)

## 2021-12-28 LAB — MAGNESIUM: Magnesium: 2.3 mg/dL (ref 1.6–2.3)

## 2021-12-28 NOTE — Progress Notes (Signed)
Established Patient Office Visit  Subjective   Patient ID: Edward Mullen, male    DOB: [birth year]  Age: 66 y.o. MRN: [id number]  Chief Complaint  Patient presents with   Follow-up    FOLLOW UP     Edward Mullen is a 66 year old man who presents today for follow-up seizure-like episode 2 days ago.Please refer to problem based charting for further details and assessment and plan of current problem and chronic medical conditions.    Patient Active Problem List   Diagnosis Date Noted   First time seizure ([place]) 12/28/2021   Type 2 diabetes mellitus with stage 3b chronic kidney disease, without long-term current use of insulin ([place]) 10/05/2021   Hemoptysis    Acute exacerbation of CHF (congestive heart failure) ([place]) 10/04/2021   Urinary retention    Scrotal mass 08/01/2020   Nocturia 08/01/2020   Lumbar spinal stenosis 06/08/2019   Healthcare maintenance 05/11/2019   Housing problems 05/11/2019   Difficulty urinating 04/07/2018   Colon cancer screening 04/07/2018   Anemia 12/19/2016   CKD (chronic kidney disease) stage 3, GFR 30-59 ml/min (HCC) 12/13/2016   Acute on chronic systolic heart failure ([place]) 09/06/2016   Non-ischemic cardiomyopathy ([place]) 08/27/2016   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    Objective:     BP 115/77 (BP Location: Right Arm, Patient Position: Sitting, Cuff Size: Normal)   Pulse 64   Temp 97.6 F (36.4 C) (Oral)   Ht 6' (1.829 m)   Wt 204 lb 9.6 oz (92.8 kg)   SpO2 100%   BMI 27.75 kg/m  BP Readings from Last 3 Encounters:  12/27/21 115/77  12/26/21 110/64  12/17/21 138/90      Physical Exam Constitutional:      Appearance: Normal appearance. He is normal weight.      Comments: Chronically ill-appearing  HENT:     Mouth/Throat:     Mouth: Mucous membranes are moist.     Pharynx: Oropharynx is clear.     Comments: No lacerations on tongue Cardiovascular:     Rate and Rhythm: Normal rate and regular rhythm.  Pulmonary:     Effort: Pulmonary effort is normal.     Breath sounds: Normal breath sounds. No rhonchi or rales.  Abdominal:     General: Abdomen is flat. Bowel sounds are normal. There is no distension.     Palpations: Abdomen is soft.     Tenderness: There is no abdominal tenderness.  Musculoskeletal:     Right lower leg: No edema.     Left lower leg: No edema.  Skin:    General: Skin is warm and dry.  Neurological:     General: No focal deficit present.     Mental Status: He is alert and oriented to person, place, and time. Mental status is at baseline.     Cranial Nerves: No cranial nerve deficit.     Sensory: No sensory deficit.     Motor: No weakness.     Coordination: Coordination normal.     Deep Tendon Reflexes: Reflexes normal.  Psychiatric:        Mood and Affect: Mood normal.        Behavior: Behavior normal.     Results for orders  placed or performed in visit on 12/27/21  CBC with Diff  Result Value Ref Range   WBC 5.5 3.4 - 10.8 x10E3/uL   RBC 5.53 4.14 - 5.80 x10E6/uL   Hemoglobin 15.0 13.0 - 17.7 g/dL   Hematocrit 46.6 37.5 - 51.0 %   MCV 84 79 - 97 fL   MCH 27.1 26.6 - 33.0 pg   MCHC 32.2 31.5 - 35.7 g/dL   RDW 15.1 11.6 - 15.4 %   Platelets 163 150 - 450 x10E3/uL   Neutrophils 49 Not Estab. %   Lymphs 24 Not Estab. %   Monocytes 20 Not Estab. %   Eos 6 Not Estab. %   Basos 1 Not Estab. %   Neutrophils Absolute 2.7 1.4 - 7.0 x10E3/uL   Lymphocytes Absolute 1.3 0.7 - 3.1 x10E3/uL   Monocytes Absolute 1.1 (H) 0.1 - 0.9 x10E3/uL   EOS (ABSOLUTE) 0.3 0.0 - 0.4 x10E3/uL   Basophils Absolute 0.1 0.0 - 0.2 x10E3/uL   Immature Granulocytes 0 Not Estab. %   Immature Grans (Abs) 0.0 0.0 - 0.1 x10E3/uL  CMP14 +  Anion Gap  Result Value Ref Range   Glucose 74 70 - 99 mg/dL   BUN 24 8 - 27 mg/dL   Creatinine, Ser 1.69 (H) 0.76 - 1.27 mg/dL   eGFR 44 (L) >59 mL/min/1.73   BUN/Creatinine Ratio 14 10 - 24   Sodium 142 134 - 144 mmol/L   Potassium 4.2 3.5 - 5.2 mmol/L   Chloride 104 96 - 106 mmol/L   CO2 22 20 - 29 mmol/L   Anion Gap 16.0 10.0 - 18.0 mmol/L   Calcium 9.1 8.6 - 10.2 mg/dL   Total Protein 7.5 6.0 - 8.5 g/dL   Albumin 4.1 3.8 - 4.8 g/dL   Globulin, Total 3.4 1.5 - 4.5 g/dL   Albumin/Globulin Ratio 1.2 1.2 - 2.2   Bilirubin Total <0.2 0.0 - 1.2 mg/dL   Alkaline Phosphatase 160 (H) 44 - 121 IU/L   AST 18 0 - 40 IU/L   ALT 12 0 - 44 IU/L  Magnesium  Result Value Ref Range   Magnesium 2.3 1.6 - 2.3 mg/dL    Last CBC Lab Results  Component Value Date   WBC 5.5 12/27/2021   HGB 15.0 12/27/2021   HCT 46.6 12/27/2021   MCV 84 12/27/2021   MCH 27.1 12/27/2021   RDW 15.1 12/27/2021   PLT 163 93/23/5573   Last metabolic panel Lab Results  Component Value Date   GLUCOSE 74 12/27/2021   NA 142 12/27/2021   K 4.2 12/27/2021   CL 104 12/27/2021   CO2 22 12/27/2021   BUN 24 12/27/2021   CREATININE 1.69 (H) 12/27/2021   EGFR 44 (L) 12/27/2021   CALCIUM 9.1 12/27/2021   PROT 7.5 12/27/2021   ALBUMIN 4.1 12/27/2021   LABGLOB 3.4 12/27/2021   AGRATIO 1.2 12/27/2021   BILITOT <0.2 12/27/2021   ALKPHOS 160 (H) 12/27/2021   AST 18 12/27/2021   ALT 12 12/27/2021   ANIONGAP 8 11/21/2021      The 10-year ASCVD risk score (Arnett DK, et al., 2019) is: 36.6%    Assessment & Plan:   Problem List Items Addressed This Visit       Other   First time seizure [facility])    Here for follow-up of first-time seizure.  He reports 2 nights ago woke up around 9 to 10 PM feeling use.  Recalls daughter saying his name of another again and  not being able to respond appropriately.  This lasted for about 30 minutes before he felt back at baseline.  He does urinary incontinence during this  episode.  Patient has a paramedic who visits daily to help with medications.  Per their note on 12/26/2021 daughter noticed episode full body tremor for about 2 to 3 minutes 2 nights ago.  This was followed by a postictal state of 15 to 20 minutes.  Patient denies any prior episodes of this.  He denies any sick recent illness or feeling different than baseline before after this.  He feels like he has been taking all his medications as organized by his paramedic.  Reports last used cocaine about a month ago denies any alcohol use or any other drug use outside of his prescribed medications.  He denies any injury or hitting his head during this episode.  This is a first-time seizure for patient.  No reported history of strokes or head trauma.  Hypodensity within the anterior limb of left internal capsule on CT in 2020 possible chronic ischemic changes versus developing infarct.  It does not appear that he had an MRI to follow this up...  We will check basic labs including CBC, BMP magnesium.  These were unremarkable.  Discussed driving restrictions with patient.  Instructed patient to follow-up if he were to have further episodes of seizure and may need further imaging and work-up to determine etiology.         Other Visit Diagnoses     Seizure-like activity ([place])    -  Primary   Relevant Orders   CBC with Diff (Completed)   CMP14 + Anion Gap (Completed)   Magnesium (Completed)       No follow-ups on file.    Iona Beard, MD  Discussed with Dr. Andrez Grime

## 2021-12-28 NOTE — Assessment & Plan Note (Addendum)
Here for follow-up of first-time seizure.  He reports 2 nights ago woke up around 9 to 10 PM feeling confused.  Recalls daughter saying his name repeatedly and not being able to respond appropriately.  This lasted for about 30 minutes before he felt back at baseline.  He does endorse urinary incontinence during this episode. He does not remember any shaking during this time, but admits his memory is vague regarding this. He states daughter told him he was briefly having shakes in he arms.   Patient has a paramedic who visits daily to help with medications.  Per their note on 12/26/2021 daughter noticed episode full body tremor for about 2 to 3 minutes 2 nights ago.  This was followed by a postictal state of 15 to 20 minutes where patient seemed confused and unable to answer questions or respond to his name. Daughter is not in office today to provide history.   Patient denies any prior episodes of this.  He denies any sick recent illness or feeling different than baseline before after this.  He feels like he has been taking all his medications as organized by his paramedic.  Reports last used cocaine about a month ago denies any alcohol use or any other drug use outside of his prescribed medications.  He denies any injury or hitting his head during this episode.  Given reported 2 minutes of full body tremor with loss of bowel control followed by 30 minutes of confusion this episode may represent first-time seizure for patient.  No reported history of strokes or head trauma.  Hypodensity within the anterior limb of left internal capsule on CT in 2020 possible chronic ischemic changes versus developing infarct.  It does not appear that he had an MRI to follow this up..  We will check basic labs including CBC, BMP magnesium.  These were unremarkable.  Discussed driving restrictions with patient.  Instructed patient to follow-up if he were to have further episodes of seizure and may need further imaging and work-up  to determine etiology.

## 2022-01-01 ENCOUNTER — Encounter (HOSPITAL_COMMUNITY): Payer: Medicare (Managed Care)

## 2022-01-01 NOTE — Progress Notes (Incomplete)
Advanced Heart Failure Clinic Note   Primary Care: Harvie Heck, MD HF Cardiologist: Dr. Aundra Dubin   HPI: Edward Mullen is a 66 y.o. male with history of HTN, DM, chronic systolic CHF, and polysubstance abuse.   Admitted 08/23/16 with acute systolic CHF in setting of substance abuse. Echo was done, showing EF 15-20%.  Diuresed with IV lasix and meds adjusted as tolerated.  UDS + for cocaine on admission. Cardiac cath showed coronary disease but not significant enough to explain cardiomyopathy. Down 23 lbs from admission weight with diuresis.  Discharge weight 193 lbs.  He was positive for cocaine again in 2/18.    He was admitted with syncope in 5/18 after using cocaine.  He was noted to have NSVT on telemetry.  Syncope suspected due to VT, no ICD given active substance abuse but had Lifevest placed. EF 50% on 8/18 echo, so Lifevest subsequently removed.  He started using cocaine again and stopped his cardiac medications. In 4/22, he was admitted with acute on chronic systolic CHF.  Frequent NSVT was noted and amiodarone was started.  He had echo showing EF 20-25% with normal RV.  LHC showed 80% PLV stenosis, managed medically.   Lost to f/u in the [facility]. Not seen back since 5/22.  Admitted 3/23 with a/c CHF and PNA. Echo with EF 20-25%, RV mildly reduced. Started on milrinone and IV lasix. RHC showed mildly elevated PCWP and LVEDP, CI 1.9. Milrinone weaned off and GDMT titrated. Had NSVT/VT, discharged home with LifeVest, weight 202 lbs.  Today he returns for post hospital HF follow up with paramedicne. PCP held spiro and losartan due to elevated kidney function. Has good days and bad, he gets SOB with walking fast. Trying to walk outside more for exercise. Overall feeling fine. Denies abnormal bleeding, palpitations, CP, dizziness, edema, or PND/Orthopnea. Appetite ok. No fever or chills. Weight at home 199-202 pounds. Taking all medications. Last used cocaine 2 weeks ago, smokes THC  daily.  LifeVest interrogation (personally reviewed): average HR 74, daily steps 2246, no treatments.  ECG (personally reviewed):  NSR, LBBB QRS 168 msec  Labs (1/18): Digoxin 0.4, BNP 961 Labs (2/18): K 4, creatinine 1.38 Labs (3/18): K 4.4, creatinine 1.85, digoxin 0.4 Labs (5/18): hgb 12.6, K 4.3, creatinine 1.3, digoxin 0.5 Labs (6/18): TSH normal, K 4.4, creatinine 2.04, AST 42, ALT 37 Labs (8/18): K 4.8, creatinine 1.65 Labs (11/18): K 4.1, creatinine 1.77 Labs (10/20): K 3.8, creatinine 1.33, hgb 14.9 Labs (4/22): digoxin 0.5, K 4.4, creatinine 1.5 Labs (3/23): K 3.8, creatinine 1.82  ROS: All systems reviewed and negative except as per HPI.   Social History: Lives in [place] with daughter. Prior heavy ETOH, now quit.  Quit smoking 10/20. Has quit using cocaine.  Active marijuana.   Family History  Problem Relation Age of Onset   Stroke Mother 39   Heart disease Father 53   Colon cancer Neg Hx    Colon polyps Neg Hx    Esophageal cancer Neg Hx    Stomach cancer Neg Hx    Rectal cancer Neg Hx    Past Medical History: 1. HTN  2. Type II diabetes 3. Cocaine abuse 4. Active smoking, suspect COPD.  5. Cardiomyopathy: Nonischemic.  - Echo (1/18) with EF 15-20%, moderate LVH, moderate AI, moderate to severe MR, normal RV size with mildly decreased systolic function, PASP 44 mmHg.  - LHC/RHC (1/18): 80% stenosis in PLV branch.  Mean RA 5, PA 47/20 mean 32, mean PCWP 22,  CI 2.01.  - Echo (8/18): EF 50% with moderate LVH, diffuse hypokinesis, normal RV size and systolic function, mild AI, trivial MR.  - Echo (4/22): EF 20-25%, severe LV dilation, normal RV. - Echo (2/23):  EF 20-25%, RV mildly reduced.  6. CAD: LHC (1/18) with 80% stenosis in a branch of the PLV (not enough CAD to explain cardiomyopathy).  - LHC (4/22): PLV 80%, 25% mLAD.  - R/LHC (2/23): 1st RPL 80%, 30% mLAD; mildly elevated PCWP and LVEDP, normal RA pressures, CI 1.9 7. Mitral regurgitation: Moderate  to severe on 1/18 echo, probably functional.  Trivial MR on 8/18 echo.  8. CKD: Stage 3.  9. Syncope: 5/18 in setting of cocaine use, concern for VT.  10. Lower extremity arterial dopplers (7/18): No significant PAD.  11. Sciatica 12. NSVT 13. CKD stage 3  Current Outpatient Medications  Medication Sig Dispense Refill   albuterol (PROVENTIL) (2.5 MG/3ML) 0.083% nebulizer solution Take 3 mLs (2.5 mg total) by nebulization every 6 (six) hours as needed for shortness of breath or wheezing. 75 mL 2   albuterol (VENTOLIN HFA) 108 (90 Base) MCG/ACT inhaler INHALE 1 TO 2 PUFFS BY MOUTH EVERY 6 HOURS AS NEEDED FOR WHEEZING OR SHORTNESS OF BREATH 18 g 2   amiodarone (PACERONE) 200 MG tablet Take 1 tablet (200 mg total) by mouth daily. 30 tablet 3   aspirin EC 81 MG tablet Take 1 tablet (81 mg total) by mouth daily. Swallow whole. (Patient not taking: Reported on 12/11/2021) 30 tablet 2   bismuth subsalicylate (PEPTO BISMOL) 262 MG/15ML suspension Take 30 mLs by mouth every 6 (six) hours as needed for indigestion. (Patient not taking: Reported on 12/17/2021)     Blood Pressure Monitor DEVI Please use to monitor blood pressure as directed.  ICD: I10 (hypertension) (Patient taking differently: Please use to monitor blood pressure as directed.  ICD: I10 (hypertension)) 1 each 0   digoxin (LANOXIN) 0.125 MG tablet TAKE 1 TABLET (0.125 MG TOTAL) BY MOUTH DAILY. 90 tablet 1   empagliflozin (JARDIANCE) 10 MG TABS tablet Take 1 tablet (10 mg total) by mouth daily. 30 tablet 6   losartan (COZAAR) 25 MG tablet Take 0.5 tablets (12.5 mg total) by mouth daily. 45 tablet 3   rosuvastatin (CRESTOR) 5 MG tablet TAKE 1 TABLET (5 MG TOTAL) BY MOUTH DAILY. 90 tablet 3   spironolactone (ALDACTONE) 25 MG tablet Take 0.5 tablets (12.5 mg total) by mouth daily. 45 tablet 3   tamsulosin (FLOMAX) 0.4 MG CAPS capsule TAKE ONE CAPSULE BY MOUTH ONCE DAILY AFTER SUPPER 30 capsule 2   torsemide (DEMADEX) 20 MG tablet Take 1 tablet (20  mg total) by mouth daily. 30 tablet 2   No current facility-administered medications for this visit.   No Known Allergies  There were no vitals taken for this visit.  Wt Readings from Last 3 Encounters:  12/27/21 92.8 kg (204 lb 9.6 oz)  12/26/21 93 kg (205 lb)  12/17/21 92.2 kg (203 lb 3.2 oz)    PHYSICAL EXAM: General:  NAD. No resp difficulty, walked into clinic with cane HEENT: Normal Neck: Supple. No JVD. Carotids 2+ bilat; no bruits. No lymphadenopathy or thryomegaly appreciated. Cor: PMI nondisplaced. Regular rate & rhythm. No rubs, gallops or murmurs. Lungs: Rhonchi RLL, wearing LifeVest. Abdomen: Soft, nontender, nondistended. No hepatosplenomegaly. No bruits or masses. Good bowel sounds. Extremities: No cyanosis, clubbing, rash, edema Neuro: Alert & oriented x 3, cranial nerves grossly intact. Moves all 4 extremities w/o difficulty.  Affect pleasant.  ASSESSMENT & PLAN: 1. Chronic Biventricular Systolic Heart Failure:  EF 15-20% with moderate to severe MR on echo 1/18.  Nonischemic cardiomyopathy. HTN vs cocaine use vs viral vs ETOH. HIV negative, SPEP with no M-spike.  Patient has been noted to have CAD but not enough to explain cardiomyopathy (Cath this admission with stable 80% PLV stenosis).  Echo in 8/18 showed EF up to 50%. However, he stopped his meds and started cocaine again, echo in 4/22 with EF back down to 20-25% => suspect cocaine abuse may be the primary etiology of cardiomyopathy. Poor compliance w/ meds and f/u in the [facility].  Echo this admission (2/23) with EF 20-25%, RV mildly reduced. RHC (3/23) with mildly elevated PCWP and LVEDP, CI 1.9. Stable NYHA II symptoms, he is not volume overloaded today. GDMT limited to CKD. - Restart spironolactone 12.5 mg daily. BMET/BNP today, repeat BMET in 1 week. - Decrease torsemide to 20 mg daily. - Continue digoxin 0.125 mg daily. Check level today. - Continue Jardiance 10 mg daily. - Continue to hold spiro and losartan until  repeat labs. - no ? blocker w/ low output  - Continue LifeVest due to frequent runs of VT/ NSVT during admission. - Not a candidate for advanced therapies including home inotropes given h/o polysubstance use and poor compliance  - Continue paramedicine. - If he can stay off cocaine, should be CRT-D candidate with wide LBBB.  2. VT/NSVT: Multiple runs this admit, longest was 28 beats. Coronary angiography showed no change in coronaries.  - Continue LifeVest. - Continue amiodarone 200 mg daily. 3. CKD III: Baseline SCr 1.6.  - Follow renal function closely with addition of spiro. 4. Hemoptysis: CT w/contrast not suggestive of PE, no evidence of nodules/masses.   - Pulmonary following, now resolved. 5. CAD: 80% stenosis PLV on cath this admission.  No chest pain. - Continue statin.  - Restart ASA now that hemoptysis resolved. - off ? blocker w/ low output.  6. Cocaine abuse: Last used 2 weeks ago.  - Discussed cessation.   Follow up in 3-4 weeks with PharmD (add low dose losartan if able), 8 weeks with APP, 3-4 months with Dr. Aundra Dubin + echo.  Allena Katz, FNP-BC 01/01/22

## 2022-01-02 ENCOUNTER — Other Ambulatory Visit (HOSPITAL_COMMUNITY): Payer: Self-pay

## 2022-01-02 ENCOUNTER — Telehealth (HOSPITAL_COMMUNITY): Payer: Self-pay | Admitting: Licensed Clinical Social Worker

## 2022-01-02 NOTE — Progress Notes (Signed)
Paramedicine Encounter    Patient ID: Edward Mullen, male    DOB: 1956-01-29, 66 y.o.   MRN: [id number]    Arrived for home visit for Edward Mullen he reports feeling good with no complaints. He denied any seizure like activity over the last week.  He denied chest pain, dizziness, or shortness of breath.   I reviewed medications and confirmed same. Pill box filled accordingly for two weeks.  (One box, top two rows morn/eve and bottom two morn/eve)   Refills: Losartan  I will plan to see him in two weeks.   I will check status of SCAT application.       Patient Care Team: Harvie Heck, MD as PCP - General Drema Pry as Social Worker  Patient Active Problem List   Diagnosis Date Noted   First time seizure [facility]) 12/28/2021   Type 2 diabetes mellitus with stage 3b chronic kidney disease, without long-term current use of insulin ([place]) 10/05/2021   Hemoptysis    Acute exacerbation of CHF (congestive heart failure) ([place]) 10/04/2021   Urinary retention    Scrotal mass 08/01/2020   Nocturia 08/01/2020   Lumbar spinal stenosis 06/08/2019   Healthcare maintenance 05/11/2019   Housing problems 05/11/2019   Difficulty urinating 04/07/2018   Colon cancer screening 04/07/2018   Anemia 12/19/2016   CKD (chronic kidney disease) stage 3, GFR 30-59 ml/min (HCC) 12/13/2016   Acute on chronic systolic heart failure ([place]) 09/06/2016   Non-ischemic cardiomyopathy ([place]) 08/27/2016   Non-obstructive hypertrophic cardiomyopathy ([place]) 08/27/2016   Mitral regurgitation 08/27/2016   Polysubstance abuse ([place])    Tobacco abuse 09/19/2014   HTN (hypertension) 03/28/2011   Bilateral lower extremity pain 03/28/2011    Current Outpatient Medications:    albuterol (PROVENTIL) (2.5 MG/3ML) 0.083% nebulizer solution, Take 3 mLs (2.5 mg total) by nebulization every 6 (six) hours as needed for shortness of breath or wheezing., Disp: 75 mL, Rfl: 2   albuterol (VENTOLIN HFA) 108 (90 Base) MCG/ACT  inhaler, INHALE 1 TO 2 PUFFS BY MOUTH EVERY 6 HOURS AS NEEDED FOR WHEEZING OR SHORTNESS OF BREATH, Disp: 18 g, Rfl: 2   amiodarone (PACERONE) 200 MG tablet, Take 1 tablet (200 mg total) by mouth daily., Disp: 30 tablet, Rfl: 3   aspirin EC 81 MG tablet, Take 1 tablet (81 mg total) by mouth daily. Swallow whole. (Patient not taking: Reported on 12/11/2021), Disp: 30 tablet, Rfl: 2   bismuth subsalicylate (PEPTO BISMOL) 262 MG/15ML suspension, Take 30 mLs by mouth every 6 (six) hours as needed for indigestion. (Patient not taking: Reported on 12/17/2021), Disp: , Rfl:    Blood Pressure Monitor DEVI, Please use to monitor blood pressure as directed.  ICD: I10 (hypertension) (Patient taking differently: Please use to monitor blood pressure as directed.  ICD: I10 (hypertension)), Disp: 1 each, Rfl: 0   digoxin (LANOXIN) 0.125 MG tablet, TAKE 1 TABLET (0.125 MG TOTAL) BY MOUTH DAILY., Disp: 90 tablet, Rfl: 1   empagliflozin (JARDIANCE) 10 MG TABS tablet, Take 1 tablet (10 mg total) by mouth daily., Disp: 30 tablet, Rfl: 6   losartan (COZAAR) 25 MG tablet, Take 0.5 tablets (12.5 mg total) by mouth daily., Disp: 45 tablet, Rfl: 3   rosuvastatin (CRESTOR) 5 MG tablet, TAKE 1 TABLET (5 MG TOTAL) BY MOUTH DAILY., Disp: 90 tablet, Rfl: 3   spironolactone (ALDACTONE) 25 MG tablet, Take 0.5 tablets (12.5 mg total) by mouth daily., Disp: 45 tablet, Rfl: 3   tamsulosin (FLOMAX) 0.4 MG CAPS capsule, TAKE  ONE CAPSULE BY MOUTH ONCE DAILY AFTER SUPPER, Disp: 30 capsule, Rfl: 2   torsemide (DEMADEX) 20 MG tablet, Take 1 tablet (20 mg total) by mouth daily., Disp: 30 tablet, Rfl: 2 No Known Allergies   Social History   Socioeconomic History   Marital status: Married    Spouse name: Not on file   Number of children: Not on file   Years of education: Not on file   Highest education level: Not on file  Occupational History   Occupation: Engineer, manufacturing systems    Comment: has not been able to work steadily for the last year or  two  Tobacco Use   Smoking status: Every Day    Packs/day: 0.10    Years: 48.00    Pack years: 4.80    Types: Cigarettes   Smokeless tobacco: Never   Tobacco comments:    cutting back 2  per day  Vaping Use   Vaping Use: Never used  Substance and Sexual Activity   Alcohol use: Yes    Alcohol/week: 1.0 standard drink    Types: 1 Cans of beer per week    Comment: occasionally   Drug use: Yes    Types: Marijuana    Comment: daily 4 times last 2 weeks ago as of 05/31/2019   Sexual activity: Yes    Birth control/protection: None  Other Topics Concern   Not on file  Social History Narrative   Not on file   Social Determinants of Health   Financial Resource Strain: Low Risk    Difficulty of Paying Living Expenses: Not very hard  Food Insecurity: No Food Insecurity   Worried About Charity fundraiser in the Last Year: Never true   Ran Out of Food in the Last Year: Never true  Transportation Needs: Unmet Transportation Needs   Lack of Transportation (Medical): Yes   Lack of Transportation (Non-Medical): No  Physical Activity: Not on file  Stress: Not on file  Social Connections: Not on file  Intimate Partner Violence: Not on file    Physical Exam      Future Appointments  Date Time Provider [place]  01/29/2022 11:00 AM [facility] PA/NP [facility] None     ACTION: Home visit completed

## 2022-01-02 NOTE — Telephone Encounter (Signed)
HF Paramedicine Team Based Care Meeting  HF MD- NA  HF NP - [place] NP-C   [facility] admit within the last 30 days for heart failure? no  Medications concerns? No gets everything delivered- eventual plan for return to bubble packs  Transportation issues? no  Education needs? None at this time  SDOH - none at this time  Eligible for discharge? Starting him on biweekly so looking better- will plan to keep till MD appt in June.  [name], LCSW Clinical Social Worker Advanced Heart Failure Clinic Desk#: [phone] Cell#: [phone]

## 2022-01-08 NOTE — Progress Notes (Signed)
Internal Medicine Clinic Attending ? ?Case discussed with Dr. Liang  At the time of the visit.  We reviewed the resident?s history and exam and pertinent patient test results.  I agree with the assessment, diagnosis, and plan of care documented in the resident?s note. ? ?

## 2022-01-16 ENCOUNTER — Other Ambulatory Visit (HOSPITAL_COMMUNITY): Payer: Self-pay

## 2022-01-16 NOTE — Progress Notes (Signed)
Paramedicine Encounter    Patient ID: Edward Mullen, male    DOB: 24-Dec-1955, 66 y.o.   MRN: [id number]    Arrived for home visit for Edward Mullen who reports feeling good with no complaints today. He reports he had an episode of chest pain earlier this week but says it didn't last long but feels fine today.   I obtained vitals and assessment All within normal range. No complaints of swelling, no edema noted. Lung sounds clear. He denied any alarms or firings of his Life Vest.   He has been compliant with medications for the exception of a few evening doses and one morning dose.   I refilled his medications for two weeks in his pill box.   Refills: NONE    SOCIAL:  He reports that he wants his own apartment but can only afford rent at $300 and says he owes [employer] $5000.   [name], [place] 01/16/2022     Patient Care Team: Harvie Heck, MD as PCP - General Drema Pry as Social Worker  Patient Active Problem List   Diagnosis Date Noted   First time seizure [facility]) 12/28/2021   Type 2 diabetes mellitus with stage 3b chronic kidney disease, without long-term current use of insulin ([place]) 10/05/2021   Hemoptysis    Acute exacerbation of CHF (congestive heart failure) ([place]) 10/04/2021   Urinary retention    Scrotal mass 08/01/2020   Nocturia 08/01/2020   Lumbar spinal stenosis 06/08/2019   Healthcare maintenance 05/11/2019   Housing problems 05/11/2019   Difficulty urinating 04/07/2018   Colon cancer screening 04/07/2018   Anemia 12/19/2016   CKD (chronic kidney disease) stage 3, GFR 30-59 ml/min (HCC) 12/13/2016   Acute on chronic systolic heart failure ([place]) 09/06/2016   Non-ischemic cardiomyopathy ([place]) 08/27/2016   Non-obstructive hypertrophic cardiomyopathy ([place]) 08/27/2016   Mitral regurgitation 08/27/2016   Polysubstance abuse ([place])    Tobacco abuse 09/19/2014   HTN (hypertension) 03/28/2011   Bilateral lower extremity pain  03/28/2011    Current Outpatient Medications:    albuterol (PROVENTIL) (2.5 MG/3ML) 0.083% nebulizer solution, Take 3 mLs (2.5 mg total) by nebulization every 6 (six) hours as needed for shortness of breath or wheezing., Disp: 75 mL, Rfl: 2   albuterol (VENTOLIN HFA) 108 (90 Base) MCG/ACT inhaler, INHALE 1 TO 2 PUFFS BY MOUTH EVERY 6 HOURS AS NEEDED FOR WHEEZING OR SHORTNESS OF BREATH, Disp: 18 g, Rfl: 2   amiodarone (PACERONE) 200 MG tablet, Take 1 tablet (200 mg total) by mouth daily., Disp: 30 tablet, Rfl: 3   aspirin EC 81 MG tablet, Take 1 tablet (81 mg total) by mouth daily. Swallow whole. (Patient not taking: Reported on 12/11/2021), Disp: 30 tablet, Rfl: 2   bismuth subsalicylate (PEPTO BISMOL) 262 MG/15ML suspension, Take 30 mLs by mouth every 6 (six) hours as needed for indigestion. (Patient not taking: Reported on 12/17/2021), Disp: , Rfl:    Blood Pressure Monitor DEVI, Please use to monitor blood pressure as directed.  ICD: I10 (hypertension) (Patient taking differently: Please use to monitor blood pressure as directed.  ICD: I10 (hypertension)), Disp: 1 each, Rfl: 0   digoxin (LANOXIN) 0.125 MG tablet, TAKE 1 TABLET (0.125 MG TOTAL) BY MOUTH DAILY., Disp: 90 tablet, Rfl: 1   empagliflozin (JARDIANCE) 10 MG TABS tablet, Take 1 tablet (10 mg total) by mouth daily., Disp: 30 tablet, Rfl: 6   losartan (COZAAR) 25 MG tablet, Take 0.5 tablets (12.5 mg total) by mouth daily., Disp: 45  tablet, Rfl: 3   rosuvastatin (CRESTOR) 5 MG tablet, TAKE 1 TABLET (5 MG TOTAL) BY MOUTH DAILY., Disp: 90 tablet, Rfl: 3   spironolactone (ALDACTONE) 25 MG tablet, Take 0.5 tablets (12.5 mg total) by mouth daily., Disp: 45 tablet, Rfl: 3   tamsulosin (FLOMAX) 0.4 MG CAPS capsule, TAKE ONE CAPSULE BY MOUTH ONCE DAILY AFTER SUPPER, Disp: 30 capsule, Rfl: 2   torsemide (DEMADEX) 20 MG tablet, Take 1 tablet (20 mg total) by mouth daily., Disp: 30 tablet, Rfl: 2 No Known Allergies   Social History   Socioeconomic  History   Marital status: Married    Spouse name: Not on file   Number of children: Not on file   Years of education: Not on file   Highest education level: Not on file  Occupational History   Occupation: Engineer, manufacturing systems    Comment: has not been able to work steadily for the last year or two  Tobacco Use   Smoking status: Every Day    Packs/day: 0.10    Years: 48.00    Pack years: 4.80    Types: Cigarettes   Smokeless tobacco: Never   Tobacco comments:    cutting back 2  per day  Vaping Use   Vaping Use: Never used  Substance and Sexual Activity   Alcohol use: Yes    Alcohol/week: 1.0 standard drink    Types: 1 Cans of beer per week    Comment: occasionally   Drug use: Yes    Types: Marijuana    Comment: daily 4 times last 2 weeks ago as of 05/31/2019   Sexual activity: Yes    Birth control/protection: None  Other Topics Concern   Not on file  Social History Narrative   Not on file   Social Determinants of Health   Financial Resource Strain: Low Risk    Difficulty of Paying Living Expenses: Not very hard  Food Insecurity: No Food Insecurity   Worried About Charity fundraiser in the Last Year: Never true   Ran Out of Food in the Last Year: Never true  Transportation Needs: Unmet Transportation Needs   Lack of Transportation (Medical): Yes   Lack of Transportation (Non-Medical): No  Physical Activity: Not on file  Stress: Not on file  Social Connections: Not on file  Intimate Partner Violence: Not on file    Physical Exam      Future Appointments  Date Time Provider [place]  01/29/2022 11:00 AM [facility] PA/NP [facility] None     ACTION: Home visit completed

## 2022-01-28 ENCOUNTER — Telehealth (HOSPITAL_COMMUNITY): Payer: Self-pay

## 2022-01-28 NOTE — Telephone Encounter (Signed)
Called to confirm appointment with [name] in AHF clinic at 11:00. He agreed with plan and I will meet him there. He was reminded to bring all meds and pill box. He agreed. Call complete.    [name], [place] 01/28/2022

## 2022-01-29 ENCOUNTER — Ambulatory Visit (HOSPITAL_COMMUNITY)
Admission: RE | Admit: 2022-01-29 | Discharge: 2022-01-29 | Disposition: A | Discharge status: Home / Self Care | Payer: Medicare (Managed Care) | Source: Ambulatory Visit | Attending: Family Medicine | Admitting: Family Medicine

## 2022-01-29 ENCOUNTER — Other Ambulatory Visit (HOSPITAL_COMMUNITY): Payer: Self-pay

## 2022-01-29 ENCOUNTER — Encounter (HOSPITAL_COMMUNITY): Payer: Self-pay

## 2022-01-29 VITALS — BP 120/76 | HR 79 | Wt 206.0 lb

## 2022-01-29 DIAGNOSIS — Z79899 Other long term (current) drug therapy: Secondary | ICD-10-CM | POA: Diagnosis not present

## 2022-01-29 DIAGNOSIS — I2510 Atherosclerotic heart disease of native coronary artery without angina pectoris: Secondary | ICD-10-CM | POA: Diagnosis not present

## 2022-01-29 DIAGNOSIS — E1122 Type 2 diabetes mellitus with diabetic chronic kidney disease: Secondary | ICD-10-CM | POA: Insufficient documentation

## 2022-01-29 DIAGNOSIS — Z91148 Patient's other noncompliance with medication regimen for other reason: Secondary | ICD-10-CM | POA: Diagnosis not present

## 2022-01-29 DIAGNOSIS — N183 Chronic kidney disease, stage 3 unspecified: Secondary | ICD-10-CM

## 2022-01-29 DIAGNOSIS — Z7902 Long term (current) use of antithrombotics/antiplatelets: Secondary | ICD-10-CM | POA: Diagnosis not present

## 2022-01-29 DIAGNOSIS — I5082 Biventricular heart failure: Secondary | ICD-10-CM | POA: Diagnosis present

## 2022-01-29 DIAGNOSIS — F1410 Cocaine abuse, uncomplicated: Secondary | ICD-10-CM

## 2022-01-29 DIAGNOSIS — Z7982 Long term (current) use of aspirin: Secondary | ICD-10-CM | POA: Diagnosis not present

## 2022-01-29 DIAGNOSIS — Z7984 Long term (current) use of oral hypoglycemic drugs: Secondary | ICD-10-CM | POA: Diagnosis not present

## 2022-01-29 DIAGNOSIS — I4729 Other ventricular tachycardia: Secondary | ICD-10-CM

## 2022-01-29 DIAGNOSIS — I5022 Chronic systolic (congestive) heart failure: Secondary | ICD-10-CM | POA: Diagnosis not present

## 2022-01-29 DIAGNOSIS — I130 Hypertensive heart and chronic kidney disease with heart failure and stage 1 through stage 4 chronic kidney disease, or unspecified chronic kidney disease: Secondary | ICD-10-CM | POA: Insufficient documentation

## 2022-01-29 DIAGNOSIS — Z87891 Personal history of nicotine dependence: Secondary | ICD-10-CM | POA: Insufficient documentation

## 2022-01-29 DIAGNOSIS — I472 Ventricular tachycardia, unspecified: Secondary | ICD-10-CM | POA: Diagnosis not present

## 2022-01-29 DIAGNOSIS — I471 Supraventricular tachycardia: Secondary | ICD-10-CM | POA: Insufficient documentation

## 2022-01-29 DIAGNOSIS — I428 Other cardiomyopathies: Secondary | ICD-10-CM | POA: Insufficient documentation

## 2022-01-29 LAB — BASIC METABOLIC PANEL WITH GFR
Anion gap: 11 (ref 5–15)
BUN: 18 mg/dL (ref 8–23)
CO2: 25 mmol/L (ref 22–32)
Calcium: 9.8 mg/dL (ref 8.9–10.3)
Chloride: 104 mmol/L (ref 98–111)
Creatinine, Ser: 1.46 mg/dL — ABNORMAL HIGH (ref 0.61–1.24)
GFR, Estimated: 52 mL/min — ABNORMAL LOW
Glucose, Bld: 88 mg/dL (ref 70–99)
Potassium: 4.2 mmol/L (ref 3.5–5.1)
Sodium: 140 mmol/L (ref 135–145)

## 2022-01-29 MED ORDER — ASPIRIN 81 MG PO TBEC: 81.0000 mg | DELAYED_RELEASE_TABLET | Freq: Every day | ORAL | 11 refills | Status: DC

## 2022-01-29 MED ORDER — CARVEDILOL 3.125 MG PO TABS: 3.1250 mg | ORAL_TABLET | Freq: Two times a day (BID) | ORAL | 11 refills | Status: AC

## 2022-01-29 NOTE — Progress Notes (Addendum)
Advanced Heart Failure Clinic Note   Primary Care: Harvie Heck, MD HF Cardiologist: Dr. Aundra Dubin   HPI: Edward Mullen is a 66 y.o. male with history of HTN, DM, chronic systolic CHF, and polysubstance abuse.   Admitted 08/23/16 with acute systolic CHF in setting of substance abuse. Echo was done, showing EF 15-20%.  Diuresed with IV lasix and meds adjusted as tolerated.  UDS + for cocaine on admission. Cardiac cath showed coronary disease but not significant enough to explain cardiomyopathy. Down 23 lbs from admission weight with diuresis.  Discharge weight 193 lbs.  He was positive for cocaine again in 2/18.    He was admitted with syncope in 5/18 after using cocaine.  He was noted to have NSVT on telemetry.  Syncope suspected due to VT, no ICD given active substance abuse but had Lifevest placed. EF 50% on 8/18 echo, so Lifevest subsequently removed.  He started using cocaine again and stopped his cardiac medications. In 4/22, he was admitted with acute on chronic systolic CHF.  Frequent NSVT was noted and amiodarone was started.  He had echo showing EF 20-25% with normal RV.  LHC showed 80% PLV stenosis, managed medically.   Lost to f/u in the [facility]. Not seen back since 5/22.  Admitted 3/23 with a/c CHF and PNA. Echo with EF 20-25%, RV mildly reduced. Started on milrinone and IV lasix. RHC showed mildly elevated PCWP and LVEDP, CI 1.9. Milrinone weaned off and GDMT titrated. Had NSVT/VT, discharged home with LifeVest, weight 202 lbs.  Follow up 4/23, NYHA II and euvolemic on exam.  Today he returns for HF follow up, with paramedic Heather. Overall feeling fine. He is not SOB with activity. Asking if he could get a bike and start riding for exercise. Denies palpitations, CP, dizziness, edema, or PND/Orthopnea. Appetite ok. No fever or chills. Weight at home stable. Taking all medications. Smokes THC daily, no cocaine use x 1 month. No issues with LifeVest.  LifeVest interrogation  (personally reviewed): average HR 72, daily steps 692, no treatments, average daily use 8.23 hrs  ECG (personally reviewed): none ordered today.  Labs (1/18): Digoxin 0.4, BNP 961 Labs (2/18): K 4, creatinine 1.38 Labs (3/18): K 4.4, creatinine 1.85, digoxin 0.4 Labs (5/18): hgb 12.6, K 4.3, creatinine 1.3, digoxin 0.5 Labs (6/18): TSH normal, K 4.4, creatinine 2.04, AST 42, ALT 37 Labs (8/18): K 4.8, creatinine 1.65 Labs (11/18): K 4.1, creatinine 1.77 Labs (10/20): K 3.8, creatinine 1.33, hgb 14.9 Labs (4/22): digoxin 0.5, K 4.4, creatinine 1.5 Labs (3/23): K 3.8, creatinine 1.82 Labs (5/23): K 4.2, creatinine 1.69  ROS: All systems reviewed and negative except as per HPI.   Social History: Lives in [place] with daughter. Prior heavy ETOH, now quit.  Quit smoking 10/20. Has quit using cocaine.  Active marijuana.   Family History  Problem Relation Age of Onset   Stroke Mother 39   Heart disease Father 12   Colon cancer Neg Hx    Colon polyps Neg Hx    Esophageal cancer Neg Hx    Stomach cancer Neg Hx    Rectal cancer Neg Hx    Past Medical History: 1. HTN  2. Type II diabetes 3. Cocaine abuse 4. Active smoking, suspect COPD.  5. Cardiomyopathy: Nonischemic.  - Echo (1/18) with EF 15-20%, moderate LVH, moderate AI, moderate to severe MR, normal RV size with mildly decreased systolic function, PASP 44 mmHg.  - LHC/RHC (1/18): 80% stenosis in PLV branch.  Mean RA  5, PA 47/20 mean 32, mean PCWP 22, CI 2.01.  - Echo (8/18): EF 50% with moderate LVH, diffuse hypokinesis, normal RV size and systolic function, mild AI, trivial MR.  - Echo (4/22): EF 20-25%, severe LV dilation, normal RV. - Echo (2/23):  EF 20-25%, RV mildly reduced.  6. CAD: LHC (1/18) with 80% stenosis in a branch of the PLV (not enough CAD to explain cardiomyopathy).  - LHC (4/22): PLV 80%, 25% mLAD.  - R/LHC (2/23): 1st RPL 80%, 30% mLAD; mildly elevated PCWP and LVEDP, normal RA pressures, CI 1.9 7. Mitral  regurgitation: Moderate to severe on 1/18 echo, probably functional.  Trivial MR on 8/18 echo.  8. CKD: Stage 3.  9. Syncope: 5/18 in setting of cocaine use, concern for VT.  10. Lower extremity arterial dopplers (7/18): No significant PAD.  11. Sciatica 12. NSVT 13. CKD stage 3  Current Outpatient Medications  Medication Sig Dispense Refill   albuterol (PROVENTIL) (2.5 MG/3ML) 0.083% nebulizer solution Take 3 mLs (2.5 mg total) by nebulization every 6 (six) hours as needed for shortness of breath or wheezing. 75 mL 2   albuterol (VENTOLIN HFA) 108 (90 Base) MCG/ACT inhaler INHALE 1 TO 2 PUFFS BY MOUTH EVERY 6 HOURS AS NEEDED FOR WHEEZING OR SHORTNESS OF BREATH 18 g 2   amiodarone (PACERONE) 200 MG tablet Take 1 tablet (200 mg total) by mouth daily. 30 tablet 3   bismuth subsalicylate (PEPTO BISMOL) 262 MG/15ML suspension Take 30 mLs by mouth every 6 (six) hours as needed for indigestion.     digoxin (LANOXIN) 0.125 MG tablet TAKE 1 TABLET (0.125 MG TOTAL) BY MOUTH DAILY. 90 tablet 1   empagliflozin (JARDIANCE) 10 MG TABS tablet Take 1 tablet (10 mg total) by mouth daily. 30 tablet 6   losartan (COZAAR) 25 MG tablet Take 0.5 tablets (12.5 mg total) by mouth daily. 45 tablet 3   rosuvastatin (CRESTOR) 5 MG tablet TAKE 1 TABLET (5 MG TOTAL) BY MOUTH DAILY. 90 tablet 3   spironolactone (ALDACTONE) 25 MG tablet Take 0.5 tablets (12.5 mg total) by mouth daily. 45 tablet 3   tamsulosin (FLOMAX) 0.4 MG CAPS capsule TAKE ONE CAPSULE BY MOUTH ONCE DAILY AFTER SUPPER 30 capsule 2   torsemide (DEMADEX) 20 MG tablet Take 1 tablet (20 mg total) by mouth daily. 30 tablet 2   No current facility-administered medications for this encounter.   No Known Allergies  BP 120/76   Pulse 79   Wt 93.4 kg (206 lb)   SpO2 98%   BMI 27.94 kg/m   Wt Readings from Last 3 Encounters:  01/29/22 93.4 kg (206 lb)  01/16/22 93.4 kg (206 lb)  01/02/22 93 kg (205 lb)    PHYSICAL EXAM: General:  NAD. No resp  difficulty HEENT: Normal Neck: Supple. No JVD. Carotids 2+ bilat; no bruits. No lymphadenopathy or thryomegaly appreciated. Cor: PMI nondisplaced. Regular rate & rhythm. No rubs, gallops or murmurs. Lungs: Clear, wearing LifeVest Abdomen: Soft, nontender, nondistended. No hepatosplenomegaly. No bruits or masses. Good bowel sounds. Extremities: No cyanosis, clubbing, rash, edema Neuro: Alert & oriented x 3, cranial nerves grossly intact. Moves all 4 extremities w/o difficulty. Affect pleasant.  ASSESSMENT & PLAN: 1. Chronic Biventricular Systolic Heart Failure:  EF 15-20% with moderate to severe MR on echo 1/18.  Nonischemic cardiomyopathy. HTN vs cocaine use vs viral vs ETOH. HIV negative, SPEP with no M-spike.  Patient has been noted to have CAD but not enough to explain cardiomyopathy (Cath this  admission with stable 80% PLV stenosis).  Echo in 8/18 showed EF up to 50%. However, he stopped his meds and started cocaine again, echo in 4/22 with EF back down to 20-25% => suspect cocaine abuse may be the primary etiology of cardiomyopathy. Poor compliance w/ meds and f/u in the [facility].  Echo this admission (2/23) with EF 20-25%, RV mildly reduced. RHC (3/23) with mildly elevated PCWP and LVEDP, CI 1.9. Stable NYHA I-II symptoms, he is not volume overloaded today. GDMT limited to CKD. - Start carvedilol 3.125 mg bid (unopposed alpha should he start back using cocaine). - Continue losartan 12.5 mg daily. - Continue torsemide 20 mg daily. - Continue digoxin 0.125 mg daily. Div level 0.7 on 11/13/21. Check dig level next visit, he took medication already today. - Continue Jardiance 10 mg daily. - Continue spiro 12.5 mg daily. - Continue LifeVest due to frequent runs of VT/ NSVT during admission. - Not a candidate for advanced therapies including home inotropes given h/o polysubstance use and poor compliance  - Continue paramedicine. - If he can stay off cocaine, should be CRT-D candidate with wide LBBB.   2. VT/NSVT: Multiple runs this admit, longest was 28 beats. Coronary angiography showed no change in coronaries.  - Continue LifeVest. - Continue amiodarone 200 mg daily. 3. CKD III: Baseline SCr 1.6. BMET today. 4. Hemoptysis: CT w/contrast not suggestive of PE, no evidence of nodules/masses.   - Pulmonary following, now resolved. 5. CAD: 80% stenosis PLV on cath this admission.  No chest pain. - Restart ASA. - Continue statin. - off ? blocker w/ low output.  6. Cocaine abuse: He has not used in about a month. Congratulated. - Discussed cessation.   Follow up with Dr. Aundra Dubin + echo, as scheduled.  Allena Katz, FNP-BC 01/29/22

## 2022-01-29 NOTE — Progress Notes (Signed)
Paramedicine Encounter    Patient ID: Edward Mullen, male    DOB: [birth year], 66 y.o.   MRN: [id number]   Arrived for clinic visit with [name] who was being seen by Allena Katz NP. He reports feeling good with no complaints. He denies shortness of breath, dizziness, chest pain or issues with Life Vest. He has been med compliant but forgot his pill box and meds at home today. I will go out tomorrow or Thursday for a med rec. He agreed.   Janett Billow made the following changes:  -Restart Aspirin '81mg'$  daily  -Start Carvedilol 3.'125mg'$  BID    I will make adjustments once medications are delivered. I will confirm with [facility] about delivery.   He asked about riding a bike for exercise. Janett Billow encouraged same with caution and advised him if he became symptomatic to refrain, he agreed.    Labs drawn and were okay with no further changes per Janett Billow.   He will follow up with Aundra Dubin in two months with ECHO. I will see Hong tomorrow or Thursday for med rec. Visit complete.   [name], [place] 01/29/2022          Patient Care Team: Harvie Heck, MD as PCP - General Drema Pry as Social Worker  Patient Active Problem List   Diagnosis Date Noted   First time seizure [facility]) 12/28/2021   Type 2 diabetes mellitus with stage 3b chronic kidney disease, without long-term current use of insulin ([place]) 10/05/2021   Hemoptysis    Acute exacerbation of CHF (congestive heart failure) ([place]) 10/04/2021   Urinary retention    Scrotal mass 08/01/2020   Nocturia 08/01/2020   Lumbar spinal stenosis 06/08/2019   Healthcare maintenance 05/11/2019   Housing problems 05/11/2019   Difficulty urinating 04/07/2018   Colon cancer screening 04/07/2018   Anemia 12/19/2016   CKD (chronic kidney disease) stage 3, GFR 30-59 ml/min (HCC) 12/13/2016   Acute on chronic systolic heart failure ([place]) 09/06/2016   Non-ischemic cardiomyopathy ([place]) 08/27/2016    Non-obstructive hypertrophic cardiomyopathy ([place]) 08/27/2016   Mitral regurgitation 08/27/2016   Polysubstance abuse ([place])    Tobacco abuse 09/19/2014   HTN (hypertension) 03/28/2011   Bilateral lower extremity pain 03/28/2011    Current Outpatient Medications:    albuterol (PROVENTIL) (2.5 MG/3ML) 0.083% nebulizer solution, Take 3 mLs (2.5 mg total) by nebulization every 6 (six) hours as needed for shortness of breath or wheezing., Disp: 75 mL, Rfl: 2   albuterol (VENTOLIN HFA) 108 (90 Base) MCG/ACT inhaler, INHALE 1 TO 2 PUFFS BY MOUTH EVERY 6 HOURS AS NEEDED FOR WHEEZING OR SHORTNESS OF BREATH, Disp: 18 g, Rfl: 2   amiodarone (PACERONE) 200 MG tablet, Take 1 tablet (200 mg total) by mouth daily., Disp: 30 tablet, Rfl: 3   aspirin EC 81 MG tablet, Take 1 tablet (81 mg total) by mouth daily. Swallow whole., Disp: 30 tablet, Rfl: 11   bismuth subsalicylate (PEPTO BISMOL) 262 MG/15ML suspension, Take 30 mLs by mouth every 6 (six) hours as needed for indigestion., Disp: , Rfl:    carvedilol (COREG) 3.125 MG tablet, Take 1 tablet (3.125 mg total) by mouth 2 (two) times daily., Disp: 60 tablet, Rfl: 11   digoxin (LANOXIN) 0.125 MG tablet, TAKE 1 TABLET (0.125 MG TOTAL) BY MOUTH DAILY., Disp: 90 tablet, Rfl: 1   empagliflozin (JARDIANCE) 10 MG TABS tablet, Take 1 tablet (10 mg total) by mouth daily., Disp: 30 tablet, Rfl: 6   losartan (COZAAR) 25 MG tablet, Take  0.5 tablets (12.5 mg total) by mouth daily., Disp: 45 tablet, Rfl: 3   rosuvastatin (CRESTOR) 5 MG tablet, TAKE 1 TABLET (5 MG TOTAL) BY MOUTH DAILY., Disp: 90 tablet, Rfl: 3   spironolactone (ALDACTONE) 25 MG tablet, Take 0.5 tablets (12.5 mg total) by mouth daily., Disp: 45 tablet, Rfl: 3   tamsulosin (FLOMAX) 0.4 MG CAPS capsule, TAKE ONE CAPSULE BY MOUTH ONCE DAILY AFTER SUPPER, Disp: 30 capsule, Rfl: 2   torsemide (DEMADEX) 20 MG tablet, Take 1 tablet (20 mg total) by mouth daily., Disp: 30 tablet, Rfl: 2 No Known Allergies   Social  History   Socioeconomic History   Marital status: Married    Spouse name: Not on file   Number of children: Not on file   Years of education: Not on file   Highest education level: Not on file  Occupational History   Occupation: Engineer, manufacturing systems    Comment: has not been able to work steadily for the last year or two  Tobacco Use   Smoking status: Every Day    Packs/day: 0.10    Years: 48.00    Total pack years: 4.80    Types: Cigarettes   Smokeless tobacco: Never   Tobacco comments:    cutting back 2  per day  Vaping Use   Vaping Use: Never used  Substance and Sexual Activity   Alcohol use: Yes    Alcohol/week: 1.0 standard drink of alcohol    Types: 1 Cans of beer per week    Comment: occasionally   Drug use: Yes    Types: Marijuana    Comment: daily 4 times last 2 weeks ago as of 05/31/2019   Sexual activity: Yes    Birth control/protection: None  Other Topics Concern   Not on file  Social History Narrative   Not on file   Social Determinants of Health   Financial Resource Strain: Low Risk  (10/11/2021)   Overall Financial Resource Strain (CARDIA)    Difficulty of Paying Living Expenses: Not very hard  Food Insecurity: No Food Insecurity (10/11/2021)   Hunger Vital Sign    Worried About Running Out of Food in the Last Year: Never true    Ran Out of Food in the Last Year: Never true  Transportation Needs: Unmet Transportation Needs (12/19/2021)   PRAPARE - Hydrologist (Medical): Yes    Lack of Transportation (Non-Medical): No  Physical Activity: Not on file  Stress: Not on file  Social Connections: Not on file  Intimate Partner Violence: Not on file    Physical Exam      Future Appointments  Date Time Provider [place]  04/04/2022 10:10 AM [facility] [place] [facility]  04/04/2022 11:20 AM Larey Dresser, MD [facility] None     ACTION: Home visit completed

## 2022-01-29 NOTE — Patient Instructions (Addendum)
RESTART Aspirin 81 mg, one tab daily  START Carvedilol 3.125 mg one tab twice a day  Labs today We will only contact you if something comes back abnormal or we need to make some changes. Otherwise no news is good news!  Your physician recommends that you schedule a follow-up appointment in: 2 months with Dr Aundra Dubin and echo  Your physician has requested that you have an echocardiogram. Echocardiography is a painless test that uses sound waves to create images of your heart. It provides your doctor with information about the size and shape of your heart and how well your heart's chambers and valves are working. This procedure takes approximately one hour. There are no restrictions for this procedure. Do the following things EVERYDAY: Weigh yourself in the morning before breakfast. Write it down and keep it in a log. Take your medicines as prescribed Eat low salt foods--Limit salt (sodium) to 2000 mg per day.  Stay as active as you can everyday Limit all fluids for the day to less than 2 liters  At the [facility], you and your health needs are our priority. As part of our continuing mission to provide you with exceptional heart care, we have created designated Provider Care Teams. These Care Teams include your primary Cardiologist (physician) and Advanced Practice Providers (APPs- Physician Assistants and Nurse Practitioners) who all work together to provide you with the care you need, when you need it.   You may see any of the following providers on your designated Care Team at your next follow up: Dr Glori Bickers Dr Haynes Kerns, NP Lyda Jester, [place] [facility] [place], [place] [name], PharmD   Please be sure to bring in all your medications bottles to every appointment.   If you have any questions or concerns before your next appointment please send [place] a message through [place] or call our office at [phone].    TO LEAVE A  MESSAGE FOR THE NURSE SELECT OPTION 2, PLEASE LEAVE A MESSAGE INCLUDING: YOUR NAME DATE OF BIRTH CALL BACK NUMBER REASON FOR CALL**this is important as we prioritize the call backs  YOU WILL RECEIVE A CALL BACK THE SAME DAY AS LONG AS YOU CALL BEFORE 4:00 PM

## 2022-02-06 ENCOUNTER — Other Ambulatory Visit (HOSPITAL_COMMUNITY): Payer: Self-pay

## 2022-02-06 NOTE — Progress Notes (Signed)
Paramedicine Encounter    Patient ID: Edward Mullen, male    DOB: 1956-01-16, 66 y.o.   MRN: [id number]    Arrived for home visit for Edward Mullen who reports feeling well. He denied any shortness of breath, dizziness or chest pain. He has been wearing his Life Vest daily and has been taking his medications but missing some doses. I explained the importance of staying med compliant- he agreed and says he will try to improve.   I reviewed meds and filled pill box for two weeks. We reviewed upcoming appointments and confirmed same.   He has no concerns at this time. He agreed to home visit in two weeks   Refills: Digoxin - next week   [name], [place] 02/06/2022   Patient Care Team: Romana Juniper, MD as PCP - General Drema Pry as Social Worker  Patient Active Problem List   Diagnosis Date Noted   First time seizure [facility]) 12/28/2021   Type 2 diabetes mellitus with stage 3b chronic kidney disease, without long-term current use of insulin ([place]) 10/05/2021   Hemoptysis    Acute exacerbation of CHF (congestive heart failure) ([place]) 10/04/2021   Urinary retention    Scrotal mass 08/01/2020   Nocturia 08/01/2020   Lumbar spinal stenosis 06/08/2019   Healthcare maintenance 05/11/2019   Housing problems 05/11/2019   Difficulty urinating 04/07/2018   Colon cancer screening 04/07/2018   Anemia 12/19/2016   CKD (chronic kidney disease) stage 3, GFR 30-59 ml/min (HCC) 12/13/2016   Acute on chronic systolic heart failure ([place]) 09/06/2016   Non-ischemic cardiomyopathy ([place]) 08/27/2016   Non-obstructive hypertrophic cardiomyopathy ([place]) 08/27/2016   Mitral regurgitation 08/27/2016   Polysubstance abuse ([place])    Tobacco abuse 09/19/2014   HTN (hypertension) 03/28/2011   Bilateral lower extremity pain 03/28/2011    Current Outpatient Medications:    albuterol (PROVENTIL) (2.5 MG/3ML) 0.083% nebulizer solution, Take 3 mLs (2.5 mg total) by  nebulization every 6 (six) hours as needed for shortness of breath or wheezing., Disp: 75 mL, Rfl: 2   albuterol (VENTOLIN HFA) 108 (90 Base) MCG/ACT inhaler, INHALE 1 TO 2 PUFFS BY MOUTH EVERY 6 HOURS AS NEEDED FOR WHEEZING OR SHORTNESS OF BREATH, Disp: 18 g, Rfl: 2   amiodarone (PACERONE) 200 MG tablet, Take 1 tablet (200 mg total) by mouth daily., Disp: 30 tablet, Rfl: 3   aspirin EC 81 MG tablet, Take 1 tablet (81 mg total) by mouth daily. Swallow whole., Disp: 30 tablet, Rfl: 11   bismuth subsalicylate (PEPTO BISMOL) 262 MG/15ML suspension, Take 30 mLs by mouth every 6 (six) hours as needed for indigestion., Disp: , Rfl:    carvedilol (COREG) 3.125 MG tablet, Take 1 tablet (3.125 mg total) by mouth 2 (two) times daily., Disp: 60 tablet, Rfl: 11   digoxin (LANOXIN) 0.125 MG tablet, TAKE 1 TABLET (0.125 MG TOTAL) BY MOUTH DAILY., Disp: 90 tablet, Rfl: 1   empagliflozin (JARDIANCE) 10 MG TABS tablet, Take 1 tablet (10 mg total) by mouth daily., Disp: 30 tablet, Rfl: 6   losartan (COZAAR) 25 MG tablet, Take 0.5 tablets (12.5 mg total) by mouth daily., Disp: 45 tablet, Rfl: 3   rosuvastatin (CRESTOR) 5 MG tablet, TAKE 1 TABLET (5 MG TOTAL) BY MOUTH DAILY., Disp: 90 tablet, Rfl: 3   spironolactone (ALDACTONE) 25 MG tablet, Take 0.5 tablets (12.5 mg total) by mouth daily., Disp: 45 tablet, Rfl: 3   tamsulosin (FLOMAX) 0.4 MG CAPS capsule, TAKE ONE CAPSULE BY MOUTH ONCE DAILY AFTER  SUPPER, Disp: 30 capsule, Rfl: 2   torsemide (DEMADEX) 20 MG tablet, Take 1 tablet (20 mg total) by mouth daily., Disp: 30 tablet, Rfl: 2 No Known Allergies   Social History   Socioeconomic History   Marital status: Married    Spouse name: Not on file   Number of children: Not on file   Years of education: Not on file   Highest education level: Not on file  Occupational History   Occupation: Engineer, manufacturing systems    Comment: has not been able to work steadily for the last year or two  Tobacco Use   Smoking status: Every  Day    Packs/day: 0.10    Years: 48.00    Total pack years: 4.80    Types: Cigarettes   Smokeless tobacco: Never   Tobacco comments:    cutting back 2  per day  Vaping Use   Vaping Use: Never used  Substance and Sexual Activity   Alcohol use: Yes    Alcohol/week: 1.0 standard drink of alcohol    Types: 1 Cans of beer per week    Comment: occasionally   Drug use: Yes    Types: Marijuana    Comment: daily 4 times last 2 weeks ago as of 05/31/2019   Sexual activity: Yes    Birth control/protection: None  Other Topics Concern   Not on file  Social History Narrative   Not on file   Social Determinants of Health   Financial Resource Strain: Low Risk  (10/11/2021)   Overall Financial Resource Strain (CARDIA)    Difficulty of Paying Living Expenses: Not very hard  Food Insecurity: No Food Insecurity (10/11/2021)   Hunger Vital Sign    Worried About Running Out of Food in the Last Year: Never true    Ran Out of Food in the Last Year: Never true  Transportation Needs: Unmet Transportation Needs (12/19/2021)   PRAPARE - Hydrologist (Medical): Yes    Lack of Transportation (Non-Medical): No  Physical Activity: Not on file  Stress: Not on file  Social Connections: Not on file  Intimate Partner Violence: Not on file    Physical Exam      Future Appointments  Date Time Provider [place]  04/04/2022 10:10 AM [facility] [place] [facility]  04/04/2022 11:20 AM Larey Dresser, MD [facility] None     ACTION: Home visit completed

## 2022-02-21 ENCOUNTER — Other Ambulatory Visit (HOSPITAL_COMMUNITY): Payer: Self-pay

## 2022-02-21 NOTE — Progress Notes (Signed)
Paramedicine Encounter    Patient ID: Edward Mullen, male    DOB: [birth year], 66 y.o.   MRN: [id number]   Arrived for home visit for Edward Mullen who reports he is feeling good today with no complaints today. He had an episode of chest pain last week where EMS was called and they reached out to me and advised his EKG was normal and vitals showed BP to be around 80 systolic with no positive orthostatic changes. He did not go to the ER and reports the pain subsided and he has not had any repeat episodes.   He did receive a call from [facility] reminding him to comply with wearing it as he admits he had taken it off and went a few days without wearing it. I educated him on the importance to wear same.   Vitals today:  WT- 205lbs BP- 110/60 HR- 66 RR-18 O2- 99%  He has been compliant with all meds over the last two weeks minus a few doses over the weekend last week. He reports he was busy and simply forgot. We reviewed all meds and I filled pill box for two weeks. He is needing a refill for Digoxin and will need it placed WEDS and THURS this week in the morning and EVERYDAY in the morning next week- instructions left on where to place once it's delivered. He is able to place same in the box.   We reviewed appointments and confirmed same. He reports no other concerns at this time. I will follow up in two weeks. Home visit complete.   Refills: Digoxin Carvedilol   Edward Mullen, Edward Mullen 02/21/2022   Patient Care Team: Edward Juniper, MD as PCP - General Edward Mullen as Social Worker  Patient Active Problem List   Diagnosis Date Noted   First time seizure [facility]) 12/28/2021   Type 2 diabetes mellitus with stage 3b chronic kidney disease, without long-term current use of insulin ([place]) 10/05/2021   Hemoptysis    Acute exacerbation of CHF (congestive heart failure) ([place]) 10/04/2021   Urinary retention    Scrotal mass 08/01/2020   Nocturia 08/01/2020   Lumbar  spinal stenosis 06/08/2019   Healthcare maintenance 05/11/2019   Housing problems 05/11/2019   Difficulty urinating 04/07/2018   Colon cancer screening 04/07/2018   Anemia 12/19/2016   CKD (chronic kidney disease) stage 3, GFR 30-59 ml/min (HCC) 12/13/2016   Acute on chronic systolic heart failure ([place]) 09/06/2016   Non-ischemic cardiomyopathy ([place]) 08/27/2016   Non-obstructive hypertrophic cardiomyopathy ([place]) 08/27/2016   Mitral regurgitation 08/27/2016   Polysubstance abuse ([place])    Tobacco abuse 09/19/2014   HTN (hypertension) 03/28/2011   Bilateral lower extremity pain 03/28/2011    Current Outpatient Medications:    albuterol (PROVENTIL) (2.5 MG/3ML) 0.083% nebulizer solution, Take 3 mLs (2.5 mg total) by nebulization every 6 (six) hours as needed for shortness of breath or wheezing., Disp: 75 mL, Rfl: 2   albuterol (VENTOLIN HFA) 108 (90 Base) MCG/ACT inhaler, INHALE 1 TO 2 PUFFS BY MOUTH EVERY 6 HOURS AS NEEDED FOR WHEEZING OR SHORTNESS OF BREATH, Disp: 18 g, Rfl: 2   amiodarone (PACERONE) 200 MG tablet, Take 1 tablet (200 mg total) by mouth daily., Disp: 30 tablet, Rfl: 3   aspirin EC 81 MG tablet, Take 1 tablet (81 mg total) by mouth daily. Swallow whole., Disp: 30 tablet, Rfl: 11   bismuth subsalicylate (PEPTO BISMOL) 262 MG/15ML suspension, Take 30 mLs by mouth every 6 (six) hours as needed for indigestion.,  Disp: , Rfl:    carvedilol (COREG) 3.125 MG tablet, Take 1 tablet (3.125 mg total) by mouth 2 (two) times daily., Disp: 60 tablet, Rfl: 11   digoxin (LANOXIN) 0.125 MG tablet, TAKE 1 TABLET (0.125 MG TOTAL) BY MOUTH DAILY., Disp: 90 tablet, Rfl: 1   empagliflozin (JARDIANCE) 10 MG TABS tablet, Take 1 tablet (10 mg total) by mouth daily., Disp: 30 tablet, Rfl: 6   losartan (COZAAR) 25 MG tablet, Take 0.5 tablets (12.5 mg total) by mouth daily., Disp: 45 tablet, Rfl: 3   rosuvastatin (CRESTOR) 5 MG tablet, TAKE 1 TABLET (5 MG TOTAL) BY MOUTH DAILY., Disp: 90 tablet, Rfl: 3    spironolactone (ALDACTONE) 25 MG tablet, Take 0.5 tablets (12.5 mg total) by mouth daily., Disp: 45 tablet, Rfl: 3   tamsulosin (FLOMAX) 0.4 MG CAPS capsule, TAKE ONE CAPSULE BY MOUTH ONCE DAILY AFTER SUPPER, Disp: 30 capsule, Rfl: 2   torsemide (DEMADEX) 20 MG tablet, Take 1 tablet (20 mg total) by mouth daily., Disp: 30 tablet, Rfl: 2 No Known Allergies   Social History   Socioeconomic History   Marital status: Married    Spouse name: Not on file   Number of children: Not on file   Years of education: Not on file   Highest education level: Not on file  Occupational History   Occupation: Engineer, manufacturing systems    Comment: has not been able to work steadily for the last year or two  Tobacco Use   Smoking status: Every Day    Packs/day: 0.10    Years: 48.00    Total pack years: 4.80    Types: Cigarettes   Smokeless tobacco: Never   Tobacco comments:    cutting back 2  per day  Vaping Use   Vaping Use: Never used  Substance and Sexual Activity   Alcohol use: Yes    Alcohol/week: 1.0 standard drink of alcohol    Types: 1 Cans of beer per week    Comment: occasionally   Drug use: Yes    Types: Marijuana    Comment: daily 4 times last 2 weeks ago as of 05/31/2019   Sexual activity: Yes    Birth control/protection: None  Other Topics Concern   Not on file  Social History Narrative   Not on file   Social Determinants of Health   Financial Resource Strain: Low Risk  (10/11/2021)   Overall Financial Resource Strain (CARDIA)    Difficulty of Paying Living Expenses: Not very hard  Food Insecurity: No Food Insecurity (10/11/2021)   Hunger Vital Sign    Worried About Running Out of Food in the Last Year: Never true    Ran Out of Food in the Last Year: Never true  Transportation Needs: Unmet Transportation Needs (12/19/2021)   PRAPARE - Hydrologist (Medical): Yes    Lack of Transportation (Non-Medical): No  Physical Activity: Not on file  Stress: Not on  file  Social Connections: Not on file  Intimate Partner Violence: Not on file    Physical Exam      Future Appointments  Date Time Provider [place]  04/04/2022 10:10 AM [facility]  04/04/2022 11:20 AM Larey Dresser, MD [facility] None     ACTION: Home visit completed

## 2022-03-07 ENCOUNTER — Other Ambulatory Visit (HOSPITAL_COMMUNITY): Payer: Self-pay

## 2022-03-07 NOTE — Progress Notes (Signed)
Paramedicine Encounter    Patient ID: Edward Mullen, male    DOB: 29-Dec-1955, 66 y.o.   MRN: [id number]  Arrived for home visit for Edward Mullen who was alert and oriented seated wearing his [place] reports feeling good with no complaints. He denied shortness of breath, dizziness, chest pain or swelling. He did report a 5 lb weight gain in two weeks, he says his appetite has improved and he is eating more. He denied any swelling in lower legs or abdomen.   Vitals obtained as noted.  WT- 210lbs BP- 110/80 HR- 70 O2- 98%  Meds reviewed and confirmed. Pill box filled for two weeks. Refills as noted: Carvedilol Jardiance Amiodarone Diona Fanti Crestor  [place] reports needing an Eye Dr and Pharmacist, community who accepts medicaid. I advised him I would assist him in finding same and setting up appointments. He appreciated same.   He follows up with [facility] tomorrow at 1120.   [name] admits to missing evening dose of medications often. He also admits someday's he does not take his morning meds because they make him feel tired. He says he is peeing someday's and someday's he isn't. I stressed the importance of med compliance on all days and times of day to ensure cardiac improve function and overall health. He verbalized understanding.   We discussed HF management and diet.   Home visit complete. I will see Edward Mullen in two weeks.   [name], [place] 03/07/2022    Patient Care Team: Edward Juniper, MD as PCP - General Drema Pry as Social Worker  Patient Active Problem List   Diagnosis Date Noted   First time seizure [facility]) 12/28/2021   Type 2 diabetes mellitus with stage 3b chronic kidney disease, without long-term current use of insulin ([place]) 10/05/2021   Hemoptysis    Acute exacerbation of CHF (congestive heart failure) ([place]) 10/04/2021   Urinary retention    Scrotal mass 08/01/2020   Nocturia 08/01/2020   Lumbar spinal stenosis 06/08/2019    Healthcare maintenance 05/11/2019   Housing problems 05/11/2019   Difficulty urinating 04/07/2018   Colon cancer screening 04/07/2018   Anemia 12/19/2016   CKD (chronic kidney disease) stage 3, GFR 30-59 ml/min (HCC) 12/13/2016   Acute on chronic systolic heart failure ([place]) 09/06/2016   Non-ischemic cardiomyopathy ([place]) 08/27/2016   Non-obstructive hypertrophic cardiomyopathy ([place]) 08/27/2016   Mitral regurgitation 08/27/2016   Polysubstance abuse ([place])    Tobacco abuse 09/19/2014   HTN (hypertension) 03/28/2011   Bilateral lower extremity pain 03/28/2011    Current Outpatient Medications:    albuterol (PROVENTIL) (2.5 MG/3ML) 0.083% nebulizer solution, Take 3 mLs (2.5 mg total) by nebulization every 6 (six) hours as needed for shortness of breath or wheezing., Disp: 75 mL, Rfl: 2   albuterol (VENTOLIN HFA) 108 (90 Base) MCG/ACT inhaler, INHALE 1 TO 2 PUFFS BY MOUTH EVERY 6 HOURS AS NEEDED FOR WHEEZING OR SHORTNESS OF BREATH, Disp: 18 g, Rfl: 2   amiodarone (PACERONE) 200 MG tablet, Take 1 tablet (200 mg total) by mouth daily., Disp: 30 tablet, Rfl: 3   aspirin EC 81 MG tablet, Take 1 tablet (81 mg total) by mouth daily. Swallow whole., Disp: 30 tablet, Rfl: 11   bismuth subsalicylate (PEPTO BISMOL) 262 MG/15ML suspension, Take 30 mLs by mouth every 6 (six) hours as needed for indigestion., Disp: , Rfl:    carvedilol (COREG) 3.125 MG tablet, Take 1 tablet (3.125 mg total) by mouth 2 (two) times daily., Disp: 60  tablet, Rfl: 11   digoxin (LANOXIN) 0.125 MG tablet, TAKE 1 TABLET (0.125 MG TOTAL) BY MOUTH DAILY., Disp: 90 tablet, Rfl: 1   empagliflozin (JARDIANCE) 10 MG TABS tablet, Take 1 tablet (10 mg total) by mouth daily., Disp: 30 tablet, Rfl: 6   losartan (COZAAR) 25 MG tablet, Take 0.5 tablets (12.5 mg total) by mouth daily., Disp: 45 tablet, Rfl: 3   rosuvastatin (CRESTOR) 5 MG tablet, TAKE 1 TABLET (5 MG TOTAL) BY MOUTH DAILY., Disp: 90 tablet, Rfl: 3   spironolactone (ALDACTONE) 25  MG tablet, Take 0.5 tablets (12.5 mg total) by mouth daily., Disp: 45 tablet, Rfl: 3   tamsulosin (FLOMAX) 0.4 MG CAPS capsule, TAKE ONE CAPSULE BY MOUTH ONCE DAILY AFTER SUPPER, Disp: 30 capsule, Rfl: 2   torsemide (DEMADEX) 20 MG tablet, Take 1 tablet (20 mg total) by mouth daily., Disp: 30 tablet, Rfl: 2 No Known Allergies   Social History   Socioeconomic History   Marital status: Married    Spouse name: Not on file   Number of children: Not on file   Years of education: Not on file   Highest education level: Not on file  Occupational History   Occupation: Engineer, manufacturing systems    Comment: has not been able to work steadily for the last year or two  Tobacco Use   Smoking status: Every Day    Packs/day: 0.10    Years: 48.00    Total pack years: 4.80    Types: Cigarettes   Smokeless tobacco: Never   Tobacco comments:    cutting back 2  per day  Vaping Use   Vaping Use: Never used  Substance and Sexual Activity   Alcohol use: Yes    Alcohol/week: 1.0 standard drink of alcohol    Types: 1 Cans of beer per week    Comment: occasionally   Drug use: Yes    Types: Marijuana    Comment: daily 4 times last 2 weeks ago as of 05/31/2019   Sexual activity: Yes    Birth control/protection: None  Other Topics Concern   Not on file  Social History Narrative   Not on file   Social Determinants of Health   Financial Resource Strain: Low Risk  (10/11/2021)   Overall Financial Resource Strain (CARDIA)    Difficulty of Paying Living Expenses: Not very hard  Food Insecurity: No Food Insecurity (10/11/2021)   Hunger Vital Sign    Worried About Running Out of Food in the Last Year: Never true    Ran Out of Food in the Last Year: Never true  Transportation Needs: Unmet Transportation Needs (12/19/2021)   PRAPARE - Hydrologist (Medical): Yes    Lack of Transportation (Non-Medical): No  Physical Activity: Not on file  Stress: Not on file  Social Connections: Not on  file  Intimate Partner Violence: Not on file    Physical Exam      Future Appointments  Date Time Provider [place]  04/04/2022 10:10 AM [facility]  04/04/2022 11:20 AM Larey Dresser, MD [facility] None     ACTION: Home visit completed

## 2022-03-20 ENCOUNTER — Telehealth (HOSPITAL_COMMUNITY): Payer: Self-pay

## 2022-03-20 NOTE — Telephone Encounter (Signed)
Called to try to schedule home visit with Mr. Sivertsen for tomorrow with no answer and unable to leave a message. I will try again tomorrow.   [name], [place] 03/20/2022

## 2022-03-28 ENCOUNTER — Other Ambulatory Visit (HOSPITAL_COMMUNITY): Payer: Self-pay

## 2022-03-28 NOTE — Progress Notes (Signed)
  Was enroute to see [name] and he called to cancel our visit for today reporting he needed to be seen next week. I reminded him of his appointment in clinic next Thursday at 1120. He agreed and plans to be there. He reports he is taking his medications as prescribed and feels fine with no complaints. I will see [name] in clinic next week. Call complete.   [name], EMT-Paramedic [phone] 03/28/2022   ACTION: Next visit planned for one week

## 2022-04-03 ENCOUNTER — Telehealth (HOSPITAL_COMMUNITY): Payer: Self-pay

## 2022-04-03 NOTE — Telephone Encounter (Signed)
Called Mr. Heeney to remind him of his appointment in the clinic tomorrow at 10:00. He agreed with plan and I will meet him during 11:20 with MD.   [name], [place] 04/03/2022

## 2022-04-04 ENCOUNTER — Other Ambulatory Visit (HOSPITAL_COMMUNITY): Payer: Self-pay

## 2022-04-04 ENCOUNTER — Ambulatory Visit (HOSPITAL_COMMUNITY)
Admission: RE | Admit: 2022-04-04 | Discharge: 2022-04-04 | Disposition: A | Discharge status: Home / Self Care | Payer: Medicare (Managed Care) | Source: Ambulatory Visit | Attending: Cardiology | Admitting: Cardiology

## 2022-04-04 ENCOUNTER — Ambulatory Visit (HOSPITAL_BASED_OUTPATIENT_CLINIC_OR_DEPARTMENT_OTHER)
Admission: RE | Admit: 2022-04-04 | Discharge: 2022-04-04 | Disposition: A | Discharge status: Home / Self Care | Payer: Medicare (Managed Care) | Source: Ambulatory Visit | Attending: Cardiology | Admitting: Cardiology

## 2022-04-04 ENCOUNTER — Encounter (HOSPITAL_COMMUNITY): Payer: Self-pay | Admitting: Cardiology

## 2022-04-04 VITALS — BP 100/60 | HR 67 | Wt 201.6 lb

## 2022-04-04 DIAGNOSIS — I428 Other cardiomyopathies: Secondary | ICD-10-CM | POA: Insufficient documentation

## 2022-04-04 DIAGNOSIS — I447 Left bundle-branch block, unspecified: Secondary | ICD-10-CM | POA: Diagnosis not present

## 2022-04-04 DIAGNOSIS — N183 Chronic kidney disease, stage 3 unspecified: Secondary | ICD-10-CM | POA: Diagnosis not present

## 2022-04-04 DIAGNOSIS — I5022 Chronic systolic (congestive) heart failure: Secondary | ICD-10-CM | POA: Insufficient documentation

## 2022-04-04 DIAGNOSIS — I472 Ventricular tachycardia, unspecified: Secondary | ICD-10-CM | POA: Insufficient documentation

## 2022-04-04 DIAGNOSIS — E785 Hyperlipidemia, unspecified: Secondary | ICD-10-CM | POA: Insufficient documentation

## 2022-04-04 DIAGNOSIS — Z87891 Personal history of nicotine dependence: Secondary | ICD-10-CM | POA: Diagnosis not present

## 2022-04-04 DIAGNOSIS — I5082 Biventricular heart failure: Secondary | ICD-10-CM | POA: Insufficient documentation

## 2022-04-04 DIAGNOSIS — Z79899 Other long term (current) drug therapy: Secondary | ICD-10-CM | POA: Diagnosis not present

## 2022-04-04 DIAGNOSIS — F1911 Other psychoactive substance abuse, in remission: Secondary | ICD-10-CM | POA: Insufficient documentation

## 2022-04-04 DIAGNOSIS — I351 Nonrheumatic aortic (valve) insufficiency: Secondary | ICD-10-CM | POA: Insufficient documentation

## 2022-04-04 DIAGNOSIS — Z7984 Long term (current) use of oral hypoglycemic drugs: Secondary | ICD-10-CM | POA: Insufficient documentation

## 2022-04-04 DIAGNOSIS — I130 Hypertensive heart and chronic kidney disease with heart failure and stage 1 through stage 4 chronic kidney disease, or unspecified chronic kidney disease: Secondary | ICD-10-CM | POA: Insufficient documentation

## 2022-04-04 DIAGNOSIS — E1122 Type 2 diabetes mellitus with diabetic chronic kidney disease: Secondary | ICD-10-CM | POA: Diagnosis not present

## 2022-04-04 DIAGNOSIS — I2510 Atherosclerotic heart disease of native coronary artery without angina pectoris: Secondary | ICD-10-CM | POA: Diagnosis not present

## 2022-04-04 DIAGNOSIS — F1411 Cocaine abuse, in remission: Secondary | ICD-10-CM | POA: Insufficient documentation

## 2022-04-04 LAB — ECHOCARDIOGRAM COMPLETE
AR max vel: 0.64 cm2
AV Area VTI: 0.63 cm2
AV Area mean vel: 0.66 cm2
AV Mean grad: 6 mmHg
AV Peak grad: 9.5 mmHg
Ao pk vel: 1.54 m/s
Area-P 1/2: 4.24 cm2
MV M vel: 1.41 m/s
MV Peak grad: 8 mmHg
S' Lateral: 6.1 cm
Single Plane A4C EF: 28.7 %

## 2022-04-04 LAB — COMPREHENSIVE METABOLIC PANEL
ALT: 16 U/L (ref 0–44)
AST: 24 U/L (ref 15–41)
Albumin: 3.9 g/dL (ref 3.5–5.0)
Alkaline Phosphatase: 130 U/L — ABNORMAL HIGH (ref 38–126)
Anion gap: 11 (ref 5–15)
BUN: 51 mg/dL — ABNORMAL HIGH (ref 8–23)
CO2: 24 mmol/L (ref 22–32)
Calcium: 9.4 mg/dL (ref 8.9–10.3)
Chloride: 103 mmol/L (ref 98–111)
Creatinine, Ser: 2.76 mg/dL — ABNORMAL HIGH (ref 0.61–1.24)
GFR, Estimated: 24 mL/min — ABNORMAL LOW (ref >60)
Glucose, Bld: 93 mg/dL (ref 70–99)
Potassium: 4.2 mmol/L (ref 3.5–5.1)
Sodium: 138 mmol/L (ref 135–145)
Total Bilirubin: 0.6 mg/dL (ref 0.3–1.2)
Total Protein: 7.8 g/dL (ref 6.5–8.1)

## 2022-04-04 LAB — DIGOXIN LEVEL: Digoxin Level: 0.7 ng/mL — ABNORMAL LOW (ref 0.8–2.0)

## 2022-04-04 LAB — BRAIN NATRIURETIC PEPTIDE: B Natriuretic Peptide: 212.5 pg/mL — ABNORMAL HIGH (ref 0.0–100.0)

## 2022-04-04 LAB — TSH: TSH: 0.991 u[IU]/mL (ref 0.350–4.500)

## 2022-04-04 MED ORDER — PERFLUTREN LIPID MICROSPHERE
1.0000 mL | INTRAVENOUS | Status: DC | PRN
  Administered 2022-04-04: 3 mL via INTRAVENOUS

## 2022-04-04 MED ORDER — SPIRONOLACTONE 25 MG PO TABS: 25.0000 mg | ORAL_TABLET | Freq: Every day | ORAL | 3 refills | Status: DC

## 2022-04-04 NOTE — Progress Notes (Signed)
2D echocardiogram with [facility] completed.  04/04/2022 11:13 AM Kelby Aline., MHA, RVT, RDCS, RDMS

## 2022-04-04 NOTE — Progress Notes (Signed)
Heart and Vascular Care Navigation  04/04/2022  Edward Mullen [birth year] [id number]  Reason for Referral: referral to affordable housing   Engaged with patient face to face for follow up visit for Heart and Vascular Care Coordination.                                                                                                   Assessment:    CSW met with pt during clinic visit to discuss current housing concerns.  Pt was living in [place] through housing authority until getting evicted about a year ago- since then he has been living with his dtr and her 3 children.  Is now ready to live independently again but states he owes over $1000 to [employer] in past due rent.  CSW suggested pt call Housing authority to inquire if he would be eligible to get back into housing authority properties despite eviction and if so how.  CSW also supplied pt with list of local income based apartments to get his name on the waitlist.  Pt also admits to some food insecurity- only gets $40/month in food stamps- reports his food is often taken by his family and is worried about having enough food at this time.  Food bag provided to pt to help with current food insecurity.                                  HRT/VAS Care Coordination     Patients Home Cardiology Office Heart Failure Clinic   Outpatient Care Team [facility] Paramedic Name: Edward Mullen [phone]   Social Worker Name: Edward Mullen- Advanced [facility]- [phone]   Living arrangements for the past 2 months Apartment   Lives with: Adult Children; Relatives   Patient Current Insurance Coverage Managed Medicare; Medicaid   Patient Has Concern With Paying Medical Bills No   Does Patient Have Prescription Coverage? Yes   Home Assistive Devices/Equipment None   DME Agency NA   [place]       Social History:                                                                              SDOH Screenings   Alcohol Screen: Not on file  Depression (PHQ2-9): Low Risk  (12/27/2021)   Depression (PHQ2-9)    PHQ-2 Score: 0  Financial Resource Strain: Low Risk  (10/11/2021)   Overall Financial Resource Strain (CARDIA)    Difficulty of Paying Living Expenses: Not very hard  Food Insecurity: Food Insecurity Present (04/04/2022)   Hunger Vital Sign    Worried About Running Out of Food in the Last Year: Sometimes true    Ran Out  of Food in the Last Year: Sometimes true  Housing: Low Risk  (10/11/2021)   Housing    Last Housing Risk Score: 0  Physical Activity: Not on file  Social Connections: Not on file  Stress: Not on file  Tobacco Use: High Risk (04/04/2022)   Patient History    Smoking Tobacco Use: Every Day    Smokeless Tobacco Use: Never    Passive Exposure: Not on file  Transportation Needs: Unmet Transportation Needs (12/19/2021)   PRAPARE - Transportation    Lack of Transportation (Medical): Yes    Lack of Transportation (Non-Medical): No    SDOH Interventions: Financial Resources:    Gets SSDI about [phone]  Food Insecurity:  Food Insecurity Interventions: Other (Comment) (Heart and Vascular food bag)  Housing Insecurity:  No insecurity but wants to move to his own place  Transportation:   drives    Follow-up plan:    Pt to call apartments and get name on waitlists- will let CSW know if there are financial barriers to move into new home.  Edward Ny, LCSW Clinical Social Worker Advanced Heart Failure Clinic Desk#: [phone] Cell#: [phone]

## 2022-04-04 NOTE — Progress Notes (Signed)
Paramedicine Encounter    Patient ID: Edward Mullen, male    DOB: 1956-01-21, 66 y.o.   MRN: [id number]   Arrived in clinic with Mr. Schupp for visit with Dr. Aundra Dubin. He reports he has been feeling okay overall but has had a few episodes of dizziness breaking out in a sweat after getting too hot but not actually passing out. He also is noted to have missed several doses of medications over the last several weeks. He admits he has not been compliant and says he wants to get back on track.   EKG, Labs, ECHO were obtained today.   ECHO showing EF at 25-30%   Following lab results the following medication changes were made per Dr. Aundra Dubin:  STOP LOSARTAN STOP TORSEMIDE  KEEP SPIRONOLACTONE AT 12.'5MG'$    PILL BOX UPDATED AT 1500  Pill box was corrected and filled to reflect changes. Mr. Boyd will have repeat labs in two weeks and follow up with pharmacy clinic on 9/13. I will continue to monitor and follow up.   He will see [facility] on 8/29 at 0920.   I set alarms up on his phone to help with med compliance. He agreed to leave them on and in place to help improve him taking his meds.   We reviewed changes and appointments and confirmed same. I plan to see [place] in two weeks. He agreed and knows to reach out if needed in the mean time.   Clinic visit complete.   REFILLS TO CALL INTO [facility] [place], [place] 04/04/2022    Patient Care Team: Romana Juniper, MD as PCP - General Drema Pry as Social Worker  Patient Active Problem List   Diagnosis Date Noted   Hyperlipemia 04/04/2022   First time seizure ([place]) 12/28/2021   Type 2 diabetes mellitus with stage 3b chronic kidney disease, without long-term current use of insulin ([place]) 10/05/2021   Hemoptysis    Acute exacerbation of CHF (congestive heart failure) ([place]) 10/04/2021   Urinary retention    Scrotal mass 08/01/2020   Nocturia  08/01/2020   Lumbar spinal stenosis 06/08/2019   Healthcare maintenance 05/11/2019   Housing problems 05/11/2019   Difficulty urinating 04/07/2018   Colon cancer screening 04/07/2018   Anemia 12/19/2016   CKD (chronic kidney disease) stage 3, GFR 30-59 ml/min (HCC) 12/13/2016   Acute on chronic systolic heart failure ([place]) 09/06/2016   Non-ischemic cardiomyopathy ([place]) 08/27/2016   Non-obstructive hypertrophic cardiomyopathy ([place]) 08/27/2016   Mitral regurgitation 08/27/2016   Polysubstance abuse ([place])    Tobacco abuse 09/19/2014   HTN (hypertension) 03/28/2011   Bilateral lower extremity pain 03/28/2011    Current Outpatient Medications:    albuterol (PROVENTIL) (2.5 MG/3ML) 0.083% nebulizer solution, Take 3 mLs (2.5 mg total) by nebulization every 6 (six) hours as needed for shortness of breath or wheezing., Disp: 75 mL, Rfl: 2   albuterol (VENTOLIN HFA) 108 (90 Base) MCG/ACT inhaler, INHALE 1 TO 2 PUFFS BY MOUTH EVERY 6 HOURS AS NEEDED FOR WHEEZING OR SHORTNESS OF BREATH, Disp: 18 g, Rfl: 2   amiodarone (PACERONE) 200 MG tablet, Take 1 tablet (200 mg total) by mouth daily., Disp: 30 tablet, Rfl: 3   aspirin EC 81 MG tablet, Take 1 tablet (81 mg total) by mouth daily. Swallow whole., Disp: 30 tablet, Rfl: 11   bismuth subsalicylate (PEPTO BISMOL) 262 MG/15ML suspension, Take 30 mLs by mouth every 6 (six) hours as needed for indigestion., Disp: ,  Rfl:    carvedilol (COREG) 3.125 MG tablet, Take 1 tablet (3.125 mg total) by mouth 2 (two) times daily., Disp: 60 tablet, Rfl: 11   digoxin (LANOXIN) 0.125 MG tablet, TAKE 1 TABLET (0.125 MG TOTAL) BY MOUTH DAILY., Disp: 90 tablet, Rfl: 1   empagliflozin (JARDIANCE) 10 MG TABS tablet, Take 1 tablet (10 mg total) by mouth daily., Disp: 30 tablet, Rfl: 6   losartan (COZAAR) 25 MG tablet, Take 0.5 tablets (12.5 mg total) by mouth daily., Disp: 45 tablet, Rfl: 3   rosuvastatin (CRESTOR) 5 MG tablet, TAKE 1 TABLET (5 MG TOTAL) BY MOUTH DAILY., Disp:  90 tablet, Rfl: 3   spironolactone (ALDACTONE) 25 MG tablet, Take 1 tablet (25 mg total) by mouth daily., Disp: 90 tablet, Rfl: 3   tamsulosin (FLOMAX) 0.4 MG CAPS capsule, TAKE ONE CAPSULE BY MOUTH ONCE DAILY AFTER SUPPER, Disp: 30 capsule, Rfl: 2   torsemide (DEMADEX) 20 MG tablet, Take 1 tablet (20 mg total) by mouth daily., Disp: 30 tablet, Rfl: 2 No Known Allergies   Social History   Socioeconomic History   Marital status: Married    Spouse name: Not on file   Number of children: Not on file   Years of education: Not on file   Highest education level: Not on file  Occupational History   Occupation: Engineer, manufacturing systems    Comment: has not been able to work steadily for the last year or two  Tobacco Use   Smoking status: Every Day    Packs/day: 0.10    Years: 48.00    Total pack years: 4.80    Types: Cigarettes   Smokeless tobacco: Never   Tobacco comments:    cutting back 2  per day  Vaping Use   Vaping Use: Never used  Substance and Sexual Activity   Alcohol use: Yes    Alcohol/week: 1.0 standard drink of alcohol    Types: 1 Cans of beer per week    Comment: occasionally   Drug use: Yes    Types: Marijuana    Comment: daily 4 times last 2 weeks ago as of 05/31/2019   Sexual activity: Yes    Birth control/protection: None  Other Topics Concern   Not on file  Social History Narrative   Not on file   Social Determinants of Health   Financial Resource Strain: Low Risk  (10/11/2021)   Overall Financial Resource Strain (CARDIA)    Difficulty of Paying Living Expenses: Not very hard  Food Insecurity: Food Insecurity Present (04/04/2022)   Hunger Vital Sign    Worried About Running Out of Food in the Last Year: Sometimes true    Ran Out of Food in the Last Year: Sometimes true  Transportation Needs: Unmet Transportation Needs (12/19/2021)   PRAPARE - Hydrologist (Medical): Yes    Lack of Transportation (Non-Medical): No  Physical Activity:  Not on file  Stress: Not on file  Social Connections: Not on file  Intimate Partner Violence: Not on file    Physical Exam      Future Appointments  Date Time Provider [place]  04/18/2022 10:30 AM [facility] LAB [facility] None  04/24/2022 11:00 AM [facility] PHARMACY [facility] None  05/23/2022 10:00 AM [facility] PA/NP [facility] None     ACTION: Home visit completed

## 2022-04-04 NOTE — Patient Instructions (Signed)
INCREASE Spironolactone to '25mg'$  daily  Labs done today, your results will be available in MyChart, we will contact you for abnormal readings.   Repeat blood work in 10 days  You have been referred to the Cardiac Electrophysiologist. The office will call you to arrange your appointment.    Please follow up with our heart failure pharmacist in 3 weeks  Your physician recommends that you schedule a follow-up appointment in: 2 months  If you have any questions or concerns before your next appointment please send [place] a message through [place] or call our office at [phone].    TO LEAVE A MESSAGE FOR THE NURSE SELECT OPTION 2, PLEASE LEAVE A MESSAGE INCLUDING: YOUR NAME DATE OF BIRTH CALL BACK NUMBER REASON FOR CALL**this is important as we prioritize the call backs  YOU WILL RECEIVE A CALL BACK THE SAME DAY AS LONG AS YOU CALL BEFORE 4:00 PM  At the [facility], you and your health needs are our priority. As part of our continuing mission to provide you with exceptional heart care, we have created designated Provider Care Teams. These Care Teams include your primary Cardiologist (physician) and Advanced Practice Providers (APPs- Physician Assistants and Nurse Practitioners) who all work together to provide you with the care you need, when you need it.   You may see any of the following providers on your designated Care Team at your next follow up: Dr Glori Bickers Dr Haynes Kerns, NP Lyda Jester, [place] [facility] [place], [place] [name], PharmD   Please be sure to bring in all your medications bottles to every appointment.

## 2022-04-04 NOTE — Progress Notes (Signed)
Advanced Heart Failure Clinic Note   Primary Care: Romana Juniper, MD HF Cardiologist: Dr. Aundra Dubin   HPI: Edward Mullen is a 66 y.o. male with history of HTN, DM, chronic systolic CHF, and polysubstance abuse.   Admitted 08/23/16 with acute systolic CHF in setting of substance abuse. Echo was done, showing EF 15-20%.  Diuresed with IV lasix and meds adjusted as tolerated.  UDS + for cocaine on admission. Cardiac cath showed coronary disease but not significant enough to explain cardiomyopathy. Down 23 lbs from admission weight with diuresis.  Discharge weight 193 lbs.  He was positive for cocaine again in 2/18.    He was admitted with syncope in 5/18 after using cocaine.  He was noted to have NSVT on telemetry.  Syncope suspected due to VT, no ICD given active substance abuse but had Lifevest placed. EF 50% on 8/18 echo, so Lifevest subsequently removed.  He started using cocaine again and stopped his cardiac medications. In 4/22, he was admitted with acute on chronic systolic CHF.  Frequent NSVT was noted and amiodarone was started.  He had echo showing EF 20-25% with normal RV.  LHC showed 80% PLV stenosis, managed medically.   Lost to f/u in the [facility]. Not seen back since 5/22.  Admitted 3/23 with a/c CHF and PNA. Echo with EF 20-25%, RV mildly reduced. Started on milrinone and IV lasix. RHC showed mildly elevated PCWP and LVEDP, CI 1.9. Milrinone weaned off and GDMT titrated. Had NSVT/VT, discharged home with LifeVest, weight 202 lbs.  Echo was done today and reviewed, EF 25-30%, global hypokinesis, moderate LV dilation, mild LVH, mildly decreased RV systolic function.   Today he returns for HF follow up with paramedic Heather. He continues to wear his Lifevest.  No palpitations.  He did have a lightheaded spell at his niece's house, resolved with sitting down.  He is short of breath walking a long distance or up stairs. He has not used any cocaine since 2/23.  Weight is down 5  lbs.    ECG (personally reviewed): NSR, LBBB 164 msec  Labs (1/18): Digoxin 0.4, BNP 961 Labs (2/18): K 4, creatinine 1.38 Labs (3/18): K 4.4, creatinine 1.85, digoxin 0.4 Labs (5/18): hgb 12.6, K 4.3, creatinine 1.3, digoxin 0.5 Labs (6/18): TSH normal, K 4.4, creatinine 2.04, AST 42, ALT 37 Labs (8/18): K 4.8, creatinine 1.65 Labs (11/18): K 4.1, creatinine 1.77 Labs (10/20): K 3.8, creatinine 1.33, hgb 14.9 Labs (4/22): digoxin 0.5, K 4.4, creatinine 1.5 Labs (3/23): K 3.8, creatinine 1.82 Labs (4/23): digoxin 0.7 Labs (5/23): K 4.2, creatinine 1.69 Labs (6/23): K 4.3, creatinine 1.46 Labs (8/23): K 4.2, creatinine 2.76  ROS: All systems reviewed and negative except as per HPI.   Social History: Lives in [place] with daughter. Prior heavy ETOH, now quit.  Quit smoking 10/20. Has quit using cocaine.  Active marijuana.   Family History  Problem Relation Age of Onset   Stroke Mother 32   Heart disease Father 74   Colon cancer Neg Hx    Colon polyps Neg Hx    Esophageal cancer Neg Hx    Stomach cancer Neg Hx    Rectal cancer Neg Hx    Past Medical History: 1. HTN  2. Type II diabetes 3. Cocaine abuse 4. Active smoking, suspect COPD.  5. Cardiomyopathy: Nonischemic.  - Echo (1/18) with EF 15-20%, moderate LVH, moderate AI, moderate to severe MR, normal RV size with mildly decreased systolic function, PASP 44 mmHg.  -  LHC/RHC (1/18): 80% stenosis in PLV branch.  Mean RA 5, PA 47/20 mean 32, mean PCWP 22, CI 2.01.  - Echo (8/18): EF 50% with moderate LVH, diffuse hypokinesis, normal RV size and systolic function, mild AI, trivial MR.  - Echo (4/22): EF 20-25%, severe LV dilation, normal RV. - Echo (2/23):  EF 20-25%, RV mildly reduced.  - Echo (8/23): EF 25-30%, global hypokinesis, moderate LV dilation, mild LVH, mildly decreased RV systolic function.  6. CAD: LHC (1/18) with 80% stenosis in a branch of the PLV (not enough CAD to explain cardiomyopathy).  - LHC (4/22):  PLV 80%, 25% mLAD.  - R/LHC (2/23): 1st RPL 80%, 30% mLAD; mildly elevated PCWP and LVEDP, normal RA pressures, CI 1.9 7. Mitral regurgitation: Moderate to severe on 1/18 echo, probably functional.  Trivial MR on 8/18 echo.  8. CKD: Stage 3.  9. Syncope: 5/18 in setting of cocaine use, concern for VT.  10. Lower extremity arterial dopplers (7/18): No significant PAD.  11. Sciatica 12. NSVT 13. CKD stage 3  Current Outpatient Medications  Medication Sig Dispense Refill   albuterol (PROVENTIL) (2.5 MG/3ML) 0.083% nebulizer solution Take 3 mLs (2.5 mg total) by nebulization every 6 (six) hours as needed for shortness of breath or wheezing. 75 mL 2   albuterol (VENTOLIN HFA) 108 (90 Base) MCG/ACT inhaler INHALE 1 TO 2 PUFFS BY MOUTH EVERY 6 HOURS AS NEEDED FOR WHEEZING OR SHORTNESS OF BREATH 18 g 2   amiodarone (PACERONE) 200 MG tablet Take 1 tablet (200 mg total) by mouth daily. 30 tablet 3   aspirin EC 81 MG tablet Take 1 tablet (81 mg total) by mouth daily. Swallow whole. 30 tablet 11   bismuth subsalicylate (PEPTO BISMOL) 262 MG/15ML suspension Take 30 mLs by mouth every 6 (six) hours as needed for indigestion.     carvedilol (COREG) 3.125 MG tablet Take 1 tablet (3.125 mg total) by mouth 2 (two) times daily. 60 tablet 11   digoxin (LANOXIN) 0.125 MG tablet TAKE 1 TABLET (0.125 MG TOTAL) BY MOUTH DAILY. 90 tablet 1   empagliflozin (JARDIANCE) 10 MG TABS tablet Take 1 tablet (10 mg total) by mouth daily. 30 tablet 6   losartan (COZAAR) 25 MG tablet Take 0.5 tablets (12.5 mg total) by mouth daily. 45 tablet 3   rosuvastatin (CRESTOR) 5 MG tablet TAKE 1 TABLET (5 MG TOTAL) BY MOUTH DAILY. 90 tablet 3   tamsulosin (FLOMAX) 0.4 MG CAPS capsule TAKE ONE CAPSULE BY MOUTH ONCE DAILY AFTER SUPPER 30 capsule 2   torsemide (DEMADEX) 20 MG tablet Take 1 tablet (20 mg total) by mouth daily. 30 tablet 2   spironolactone (ALDACTONE) 25 MG tablet Take 1 tablet (25 mg total) by mouth daily. 90 tablet 3   No  current facility-administered medications for this encounter.   No Known Allergies  BP 100/60   Pulse 67   Wt 91.4 kg (201 lb 9.6 oz)   SpO2 99%   BMI 27.34 kg/m   Wt Readings from Last 3 Encounters:  04/04/22 91.4 kg (201 lb 9.6 oz)  03/07/22 95.3 kg (210 lb)  02/21/22 93 kg (205 lb)    PHYSICAL EXAM: General: NAD Neck: No JVD, no thyromegaly or thyroid nodule.  Lungs: Occasional rhonchi CV: Nondisplaced PMI.  Heart regular S1/S2, no S3/S4, no murmur.  No peripheral edema.  No carotid bruit.  Normal pedal pulses.  Abdomen: Soft, nontender, no hepatosplenomegaly, no distention.  Skin: Intact without lesions or rashes.  Neurologic:  Alert and oriented x 3.  Psych: Normal affect. Extremities: No clubbing or cyanosis.  HEENT: Normal.   ASSESSMENT & PLAN: 1. Chronic Biventricular Systolic Heart Failure:  EF 15-20% with moderate to severe MR on echo 1/18.  Nonischemic cardiomyopathy. HTN vs cocaine use vs viral vs ETOH. HIV negative, SPEP with no M-spike.  Patient has been noted to have CAD but not enough to explain cardiomyopathy (Cath 2/23 admission with stable 80% PLV stenosis).  Echo in 8/18 showed EF up to 50%. However, he stopped his meds and started cocaine again, echo in 4/22 with EF back down to 20-25% => suspect cocaine abuse may be the primary etiology of cardiomyopathy. Echo in 2/23 with EF 20-25%, RV mildly reduced. RHC (3/23) with mildly elevated PCWP and LVEDP, CI 1.9.  He has stayed abstinent from cocaine for almost 6 months.  Echo today showed EF 25-30%, global hypokinesis, moderate LV dilation, mild LVH, mildly decreased RV systolic function. Stable NYHA II symptoms, he is not volume overloaded today => creatinine noted to be much higher than baseline today at 2.76.   - Continue carvedilol 3.125 mg bid. - Stop losartan with creatinine up to 2.76. Repeat BMET in 10 days.  - Stop torsemide with creatinine up to 2.76. - Continue digoxin 0.125 mg daily.  Check level today,  stop if high.  If not, continue unless creatinine does not trend back down.  - Continue Jardiance 10 mg daily. - Continue spiro 12.5 mg daily. - He is wearing Lifevest.  With persistently depressed EF and LBBB (and abstinent almost 6 months from cocaine), I will refer to EP for CRT-D device.  - Continue paramedicine. 2. VT/NSVT: Multiple runs during 3/23 admission, longest was 28 beats. Coronary angiography showed no change in coronaries.  - Continue LifeVest until he gets CRT-D device. - Continue amiodarone 200 mg daily, check LFTs and TSH.  He should get regular eye exam.  3. CKD III: Baseline SCr 1.6. Creatinine up to 2.76 today, ?over-diuresed. - Stop losartan and torsemide as above.  4. CAD: 80% stenosis PLV on cath in 2/23.  No chest pain. - Continue ASA. - Continue statin, check lipids today. 6. Cocaine abuse: He has quit for about 6 months.    Follow up with APP in 2 months. See HF pharmacist for med titration in 3 wks.   Loralie Champagne. 04/04/2022
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
## 2022-04-17 ENCOUNTER — Telehealth (HOSPITAL_COMMUNITY): Payer: Self-pay

## 2022-04-17 NOTE — Telephone Encounter (Signed)
Called to remind Edward Mullen about his lab appointment tomorrow at 10:30 and to set up home paramedicine visit once labs are resulted. He agreed with plan I will see him tomorrow afternoon. Call complete.  [name], [place] 04/17/2022

## 2022-04-18 ENCOUNTER — Telehealth (HOSPITAL_COMMUNITY): Payer: Self-pay

## 2022-04-18 ENCOUNTER — Ambulatory Visit (HOSPITAL_COMMUNITY)
Admission: RE | Admit: 2022-04-18 | Discharge: 2022-04-18 | Disposition: A | Discharge status: Home / Self Care | Payer: Medicare (Managed Care) | Source: Ambulatory Visit | Attending: Internal Medicine | Admitting: Internal Medicine

## 2022-04-18 DIAGNOSIS — I5022 Chronic systolic (congestive) heart failure: Secondary | ICD-10-CM

## 2022-04-18 LAB — BASIC METABOLIC PANEL
Anion gap: 11 (ref 5–15)
BUN: 18 mg/dL (ref 8–23)
CO2: 20 mmol/L — ABNORMAL LOW (ref 22–32)
Calcium: 8.8 mg/dL — ABNORMAL LOW (ref 8.9–10.3)
Chloride: 109 mmol/L (ref 98–111)
Creatinine, Ser: 1.5 mg/dL — ABNORMAL HIGH (ref 0.61–1.24)
GFR, Estimated: 51 mL/min — ABNORMAL LOW (ref >60)
Glucose, Bld: 120 mg/dL — ABNORMAL HIGH (ref 70–99)
Potassium: 3.7 mmol/L (ref 3.5–5.1)
Sodium: 140 mmol/L (ref 135–145)

## 2022-04-18 NOTE — Telephone Encounter (Signed)
Spoke to [place] confirming home visit for today and he reports he has to cancel as something came up with his daughter and he is unable to be seen today. I advised him I would meet him in clinic next week at his Pharmacy clinic visit on Weds. At 11:00. He agreed and says he has medications and is doing okay. I will follow up.   -NOTE HE HAS CANCELED MULTIPLE TIMES OVER THE LAST FEW WEEKS, WILL DISCUSS AT OUR NEXT VISIT- PARAMEDICINE POLICY.  [name], [place] 04/18/2022

## 2022-04-23 ENCOUNTER — Telehealth (HOSPITAL_COMMUNITY): Payer: Self-pay

## 2022-04-23 NOTE — Telephone Encounter (Signed)
Called to remind Edward Mullen of his pharmacy clinic appointment tomorrow at 45 and to bring all meds and pill box and I will meet him there. He agreed with plan. Call complete.   [name], [place] 04/23/2022

## 2022-04-24 ENCOUNTER — Other Ambulatory Visit (HOSPITAL_COMMUNITY): Payer: Self-pay

## 2022-04-24 ENCOUNTER — Ambulatory Visit (HOSPITAL_COMMUNITY)
Admission: RE | Admit: 2022-04-24 | Discharge: 2022-04-24 | Disposition: A | Discharge status: Home / Self Care | Payer: Medicare (Managed Care) | Source: Ambulatory Visit | Attending: Cardiology | Admitting: Cardiology

## 2022-04-24 ENCOUNTER — Other Ambulatory Visit (HOSPITAL_COMMUNITY): Payer: Self-pay | Admitting: Pharmacist

## 2022-04-24 DIAGNOSIS — Z7984 Long term (current) use of oral hypoglycemic drugs: Secondary | ICD-10-CM | POA: Insufficient documentation

## 2022-04-24 DIAGNOSIS — I5022 Chronic systolic (congestive) heart failure: Secondary | ICD-10-CM | POA: Insufficient documentation

## 2022-04-24 DIAGNOSIS — E1122 Type 2 diabetes mellitus with diabetic chronic kidney disease: Secondary | ICD-10-CM | POA: Insufficient documentation

## 2022-04-24 DIAGNOSIS — I428 Other cardiomyopathies: Secondary | ICD-10-CM | POA: Insufficient documentation

## 2022-04-24 DIAGNOSIS — I2510 Atherosclerotic heart disease of native coronary artery without angina pectoris: Secondary | ICD-10-CM | POA: Diagnosis not present

## 2022-04-24 DIAGNOSIS — I130 Hypertensive heart and chronic kidney disease with heart failure and stage 1 through stage 4 chronic kidney disease, or unspecified chronic kidney disease: Secondary | ICD-10-CM | POA: Insufficient documentation

## 2022-04-24 DIAGNOSIS — N183 Chronic kidney disease, stage 3 unspecified: Secondary | ICD-10-CM | POA: Insufficient documentation

## 2022-04-24 DIAGNOSIS — F1411 Cocaine abuse, in remission: Secondary | ICD-10-CM | POA: Diagnosis not present

## 2022-04-24 DIAGNOSIS — Z79899 Other long term (current) drug therapy: Secondary | ICD-10-CM | POA: Insufficient documentation

## 2022-04-24 DIAGNOSIS — I447 Left bundle-branch block, unspecified: Secondary | ICD-10-CM | POA: Insufficient documentation

## 2022-04-24 DIAGNOSIS — Z7982 Long term (current) use of aspirin: Secondary | ICD-10-CM | POA: Insufficient documentation

## 2022-04-24 DIAGNOSIS — I5082 Biventricular heart failure: Secondary | ICD-10-CM | POA: Insufficient documentation

## 2022-04-24 MED ORDER — SPIRONOLACTONE 25 MG PO TABS: 25.0000 mg | ORAL_TABLET | Freq: Every day | ORAL | 3 refills | Status: DC

## 2022-04-24 MED ORDER — TORSEMIDE 20 MG PO TABS: 20.0000 mg | ORAL_TABLET | Freq: Every day | ORAL | 2 refills | Status: DC | PRN

## 2022-04-24 NOTE — Patient Instructions (Signed)
It was a pleasure seeing you today!  MEDICATIONS: -We are changing your medications today -Increase spironolactone to 25 mg daily.  -Call if you have questions about your medications.   NEXT APPOINTMENT: Return to clinic in 1 month with [facility].  In general, to take care of your heart failure: -Limit your fluid intake to 2 Liters (half-gallon) per day.   -Limit your salt intake to ideally 2-3 grams (2000-3000 mg) per day. -Weigh yourself daily and record, and bring that "weight diary" to your next appointment.  (Weight gain of 2-3 pounds in 1 day typically means fluid weight.) -The medications for your heart are to help your heart and help you live longer.   -Please contact us before stopping any of your heart medications.  Call the clinic at [phone] with questions or to reschedule future appointments.

## 2022-04-24 NOTE — Progress Notes (Addendum)
Paramedicine Encounter    Patient ID: Edward Mullen, male    DOB: 16-Oct-1955, 66 y.o.   MRN: [id number]   Arrived for pharmacy clinic visit for Edward Mullen who reports feeling good today. He has been compliant with his medications. [name] met with patient in clinic today.   Vitals obtained: -WT 202 -BP 130/88 -HR 88  MED CHANGES- Increase Spironolactone to 68m  Torsemide PRN   He has bubble packs from [employer] he has not started using yet. however he does not have Carvedilol in the bubble packs. I will take bubble packs to [facility] to have them corrected and they will deliver.   I set up one week of medications for him and discussed upcoming appointments. Clinic visit complete. I will follow up in one to two weeks.   HSalena Saner EDenison9/13/2023     Patient Care Team: GRomana Juniper MD as PCP - General ADrema Pryas Social Worker  Patient Active Problem List   Diagnosis Date Noted   Hyperlipemia 04/04/2022   First time seizure ([facility] 12/28/2021   Type 2 diabetes mellitus with stage 3b chronic kidney disease, without long-term current use of insulin ([place] 10/05/2021   Hemoptysis    Acute exacerbation of CHF (congestive heart failure) ([place] 10/04/2021   Urinary retention    Scrotal mass 08/01/2020   Nocturia 08/01/2020   Lumbar spinal stenosis 06/08/2019   Healthcare maintenance 05/11/2019   Housing problems 05/11/2019   Difficulty urinating 04/07/2018   Colon cancer screening 04/07/2018   Anemia 12/19/2016   CKD (chronic kidney disease) stage 3, GFR 30-59 ml/min (HCC) 12/13/2016   Acute on chronic systolic heart failure ([place] 09/06/2016   Non-ischemic cardiomyopathy ([place] 08/27/2016   Non-obstructive hypertrophic cardiomyopathy ([place] 08/27/2016   Mitral regurgitation 08/27/2016   Polysubstance abuse ([place]    Tobacco abuse 09/19/2014   HTN (hypertension) 03/28/2011   Bilateral lower extremity pain 03/28/2011     Current Outpatient Medications:    albuterol (PROVENTIL) (2.5 MG/3ML) 0.083% nebulizer solution, Take 3 mLs (2.5 mg total) by nebulization every 6 (six) hours as needed for shortness of breath or wheezing., Disp: 75 mL, Rfl: 2   albuterol (VENTOLIN HFA) 108 (90 Base) MCG/ACT inhaler, INHALE 1 TO 2 PUFFS BY MOUTH EVERY 6 HOURS AS NEEDED FOR WHEEZING OR SHORTNESS OF BREATH, Disp: 18 g, Rfl: 2   amiodarone (PACERONE) 200 MG tablet, Take 1 tablet (200 mg total) by mouth daily., Disp: 30 tablet, Rfl: 3   aspirin EC 81 MG tablet, Take 1 tablet (81 mg total) by mouth daily. Swallow whole., Disp: 30 tablet, Rfl: 11   bismuth subsalicylate (PEPTO BISMOL) 262 MG/15ML suspension, Take 30 mLs by mouth every 6 (six) hours as needed for indigestion., Disp: , Rfl:    carvedilol (COREG) 3.125 MG tablet, Take 1 tablet (3.125 mg total) by mouth 2 (two) times daily., Disp: 60 tablet, Rfl: 11   Cholecalciferol (VITAMIN D3) 50 MCG (2000 UT) TABS, Take 1 tablet by mouth daily., Disp: , Rfl:    digoxin (LANOXIN) 0.125 MG tablet, TAKE 1 TABLET (0.125 MG TOTAL) BY MOUTH DAILY., Disp: 90 tablet, Rfl: 1   empagliflozin (JARDIANCE) 10 MG TABS tablet, Take 1 tablet (10 mg total) by mouth daily., Disp: 30 tablet, Rfl: 6   rosuvastatin (CRESTOR) 5 MG tablet, TAKE 1 TABLET (5 MG TOTAL) BY MOUTH DAILY., Disp: 90 tablet, Rfl: 3   spironolactone (ALDACTONE) 25 MG tablet, Take 1 tablet (25 mg total) by mouth daily.,  Disp: 90 tablet, Rfl: 3   tamsulosin (FLOMAX) 0.4 MG CAPS capsule, TAKE ONE CAPSULE BY MOUTH ONCE DAILY AFTER SUPPER, Disp: 30 capsule, Rfl: 2   torsemide (DEMADEX) 20 MG tablet, Take 1 tablet (20 mg total) by mouth daily as needed (extra fluid)., Disp: 30 tablet, Rfl: 2 No Known Allergies   Social History   Socioeconomic History   Marital status: Married    Spouse name: Not on file   Number of children: Not on file   Years of education: Not on file   Highest education level: Not on file  Occupational History    Occupation: Engineer, manufacturing systems    Comment: has not been able to work steadily for the last year or two  Tobacco Use   Smoking status: Every Day    Packs/day: 0.10    Years: 48.00    Total pack years: 4.80    Types: Cigarettes   Smokeless tobacco: Never   Tobacco comments:    cutting back 2  per day  Vaping Use   Vaping Use: Never used  Substance and Sexual Activity   Alcohol use: Yes    Alcohol/week: 1.0 standard drink of alcohol    Types: 1 Cans of beer per week    Comment: occasionally   Drug use: Yes    Types: Marijuana    Comment: daily 4 times last 2 weeks ago as of 05/31/2019   Sexual activity: Yes    Birth control/protection: None  Other Topics Concern   Not on file  Social History Narrative   Not on file   Social Determinants of Health   Financial Resource Strain: Low Risk  (10/11/2021)   Overall Financial Resource Strain (CARDIA)    Difficulty of Paying Living Expenses: Not very hard  Food Insecurity: Food Insecurity Present (04/04/2022)   Hunger Vital Sign    Worried About Running Out of Food in the Last Year: Sometimes true    Ran Out of Food in the Last Year: Sometimes true  Transportation Needs: Unmet Transportation Needs (12/19/2021)   PRAPARE - Hydrologist (Medical): Yes    Lack of Transportation (Non-Medical): No  Physical Activity: Not on file  Stress: Not on file  Social Connections: Not on file  Intimate Partner Violence: Not on file    Physical Exam      Future Appointments  Date Time Provider [place]  05/02/2022 10:00 AM [facility] LAB [facility] None  05/20/2022 10:00 AM Deboraha Sprang, MD [facility]  05/23/2022 10:00 AM [facility] PA/NP [facility] None     ACTION: Home visit completed

## 2022-05-02 ENCOUNTER — Ambulatory Visit (HOSPITAL_COMMUNITY)
Admission: RE | Admit: 2022-05-02 | Discharge: 2022-05-02 | Disposition: A | Discharge status: Home / Self Care | Payer: Medicare (Managed Care) | Source: Ambulatory Visit | Attending: Internal Medicine | Admitting: Internal Medicine

## 2022-05-02 ENCOUNTER — Telehealth (HOSPITAL_COMMUNITY): Payer: Self-pay

## 2022-05-02 DIAGNOSIS — I5022 Chronic systolic (congestive) heart failure: Secondary | ICD-10-CM | POA: Insufficient documentation

## 2022-05-02 LAB — BASIC METABOLIC PANEL
Anion gap: 10 (ref 5–15)
BUN: 18 mg/dL (ref 8–23)
CO2: 23 mmol/L (ref 22–32)
Calcium: 9.4 mg/dL (ref 8.9–10.3)
Chloride: 108 mmol/L (ref 98–111)
Creatinine, Ser: 1.62 mg/dL — ABNORMAL HIGH (ref 0.61–1.24)
GFR, Estimated: 46 mL/min — ABNORMAL LOW (ref >60)
Glucose, Bld: 102 mg/dL — ABNORMAL HIGH (ref 70–99)
Potassium: 4.6 mmol/L (ref 3.5–5.1)
Sodium: 141 mmol/L (ref 135–145)

## 2022-05-02 NOTE — Telephone Encounter (Signed)
Spoke to [employer] confirming the bubble packs and delivery date. They were delivered yesterday and I verified the same. All were correct.   No med changes after labs today per [name].   I will see Yoel in one week for follow up.   [name], [place] 05/02/2022

## 2022-05-09 ENCOUNTER — Other Ambulatory Visit (HOSPITAL_COMMUNITY): Payer: Self-pay

## 2022-05-09 NOTE — Progress Notes (Signed)
Paramedicine Encounter    Patient ID: Edward Mullen, male    DOB: 16-Apr-1956, 66 y.o.   MRN: [id number]   Arrived for home visit for Edward Mullen who reports he is doing well and feeling good. He denied shortness of breath, dizziness, leg swelling or weight gain. He has been using his bubble packs that I set up with [facility] and he has been compliant with same. I verified same today with his med list.   I obtained vitals and assessment:  WT- 199lbs BP- 110/70 HR- 58 O2- 99% RR-16  Lungs clear and no lower leg swelling.  He denied any episodes of dizziness or trouble breathing.   He says he is smoking some but not much. He denied alcohol or drug use.   He remains living with his daughter and grandkids. He has not reached out to [facility] to see how much he owes them to re-establish housing with them.   He will see PCP at [facility] on Oct 3 at 11:00   He see's Caryl Comes on 10/9   I will follow up with him at HF clinic on 10/12.   He is aware of all and I wrote same down for a reminder.   Home visit complete.   Refills: Proventil Solution Ventolin [place], [place] 05/09/2022    Patient Care Team: Romana Juniper, MD as PCP - General Drema Pry as Social Worker  Patient Active Problem List   Diagnosis Date Noted   Hyperlipemia 04/04/2022   First time seizure ([place]) 12/28/2021   Type 2 diabetes mellitus with stage 3b chronic kidney disease, without long-term current use of insulin ([place]) 10/05/2021   Hemoptysis    Acute exacerbation of CHF (congestive heart failure) ([place]) 10/04/2021   Urinary retention    Scrotal mass 08/01/2020   Nocturia 08/01/2020   Lumbar spinal stenosis 06/08/2019   Healthcare maintenance 05/11/2019   Housing problems 05/11/2019   Difficulty urinating 04/07/2018   Colon cancer screening 04/07/2018   Anemia 12/19/2016   CKD (chronic kidney disease) stage 3, GFR 30-59 ml/min (HCC)  12/13/2016   Acute on chronic systolic heart failure ([place]) 09/06/2016   Non-ischemic cardiomyopathy ([place]) 08/27/2016   Non-obstructive hypertrophic cardiomyopathy ([place]) 08/27/2016   Mitral regurgitation 08/27/2016   Polysubstance abuse ([place])    Tobacco abuse 09/19/2014   HTN (hypertension) 03/28/2011   Bilateral lower extremity pain 03/28/2011    Current Outpatient Medications:    albuterol (PROVENTIL) (2.5 MG/3ML) 0.083% nebulizer solution, Take 3 mLs (2.5 mg total) by nebulization every 6 (six) hours as needed for shortness of breath or wheezing., Disp: 75 mL, Rfl: 2   albuterol (VENTOLIN HFA) 108 (90 Base) MCG/ACT inhaler, INHALE 1 TO 2 PUFFS BY MOUTH EVERY 6 HOURS AS NEEDED FOR WHEEZING OR SHORTNESS OF BREATH, Disp: 18 g, Rfl: 2   amiodarone (PACERONE) 200 MG tablet, Take 1 tablet (200 mg total) by mouth daily., Disp: 30 tablet, Rfl: 3   aspirin EC 81 MG tablet, Take 1 tablet (81 mg total) by mouth daily. Swallow whole., Disp: 30 tablet, Rfl: 11   bismuth subsalicylate (PEPTO BISMOL) 262 MG/15ML suspension, Take 30 mLs by mouth every 6 (six) hours as needed for indigestion., Disp: , Rfl:    carvedilol (COREG) 3.125 MG tablet, Take 1 tablet (3.125 mg total) by mouth 2 (two) times daily., Disp: 60 tablet, Rfl: 11   Cholecalciferol (VITAMIN D3) 50 MCG (2000 UT) TABS, Take 1 tablet by mouth daily., Disp: ,  Rfl:    digoxin (LANOXIN) 0.125 MG tablet, TAKE 1 TABLET (0.125 MG TOTAL) BY MOUTH DAILY., Disp: 90 tablet, Rfl: 1   empagliflozin (JARDIANCE) 10 MG TABS tablet, Take 1 tablet (10 mg total) by mouth daily., Disp: 30 tablet, Rfl: 6   rosuvastatin (CRESTOR) 5 MG tablet, TAKE 1 TABLET (5 MG TOTAL) BY MOUTH DAILY., Disp: 90 tablet, Rfl: 3   spironolactone (ALDACTONE) 25 MG tablet, Take 1 tablet (25 mg total) by mouth daily., Disp: 90 tablet, Rfl: 3   tamsulosin (FLOMAX) 0.4 MG CAPS capsule, TAKE ONE CAPSULE BY MOUTH ONCE DAILY AFTER SUPPER, Disp: 30 capsule, Rfl: 2   torsemide (DEMADEX) 20 MG  tablet, Take 1 tablet (20 mg total) by mouth daily as needed (extra fluid)., Disp: 30 tablet, Rfl: 2 No Known Allergies   Social History   Socioeconomic History   Marital status: Married    Spouse name: Not on file   Number of children: Not on file   Years of education: Not on file   Highest education level: Not on file  Occupational History   Occupation: Engineer, manufacturing systems    Comment: has not been able to work steadily for the last year or two  Tobacco Use   Smoking status: Every Day    Packs/day: 0.10    Years: 48.00    Total pack years: 4.80    Types: Cigarettes   Smokeless tobacco: Never   Tobacco comments:    cutting back 2  per day  Vaping Use   Vaping Use: Never used  Substance and Sexual Activity   Alcohol use: Yes    Alcohol/week: 1.0 standard drink of alcohol    Types: 1 Cans of beer per week    Comment: occasionally   Drug use: Yes    Types: Marijuana    Comment: daily 4 times last 2 weeks ago as of 05/31/2019   Sexual activity: Yes    Birth control/protection: None  Other Topics Concern   Not on file  Social History Narrative   Not on file   Social Determinants of Health   Financial Resource Strain: Low Risk  (10/11/2021)   Overall Financial Resource Strain (CARDIA)    Difficulty of Paying Living Expenses: Not very hard  Food Insecurity: Food Insecurity Present (04/04/2022)   Hunger Vital Sign    Worried About Running Out of Food in the Last Year: Sometimes true    Ran Out of Food in the Last Year: Sometimes true  Transportation Needs: Unmet Transportation Needs (12/19/2021)   PRAPARE - Hydrologist (Medical): Yes    Lack of Transportation (Non-Medical): No  Physical Activity: Not on file  Stress: Not on file  Social Connections: Not on file  Intimate Partner Violence: Not on file    Physical Exam      Future Appointments  Date Time Provider [place]  05/20/2022 10:00 AM Deboraha Sprang, MD [facility]  05/23/2022 10:00 AM [facility] PA/NP [facility] None     ACTION: Home visit completed

## 2022-05-20 ENCOUNTER — Institutional Professional Consult (permissible substitution): Payer: Medicare (Managed Care) | Admitting: Internal Medicine

## 2022-05-20 ENCOUNTER — Telehealth: Payer: Self-pay

## 2022-05-20 NOTE — Progress Notes (Deleted)
ELECTROPHYSIOLOGY CONSULT NOTE  Patient ID: Edward Mullen, MRN: [id number], DOB/AGE: 01-16-1956 66 y.o. Admit date: (Not on file) Date of Consult: 05/20/2022  Primary Physician: Romana Juniper, MD Primary Cardiologist: ***     Edward Mullen is a 66 y.o. male who is being seen today for the evaluation of *** at the request of ***.    HPI Edward Mullen is a 66 y.o. male ***   Syncope 5/18 following cocaine use.  Nonsustained VT noted on telemetry; LifeVest recommended in the context of ongoing drug abuse.  Subsequently discontinued with improved EF. Hospitalized 4/22 with recurrent cocaine use and acute/chronic CHF, VT nonsustained>> amiodarone Rehospitalized 3/23 with acute/chronic CHF/pneumonia--requiring milrinone.  Recurrent ventricular tachycardia and again discharged with a LifeVest  DATE TEST EF   1/18 Echo   15-20 %   8/18 Echo   50 %   4/22 Echo  20-25%   4/22 LHC    3/23 Echo  20-25%   8/23 Echo  25-30%      Date Cr K Dig Hgb  ***/*** *** ***  ***  5/23 1.46 4.2  15.0  8/23 2.76 3.7 0.7   9/23 1.62 4.6        Past Medical History:  Diagnosis Date   Arthritis    "feels like it in my legs" (09/20/2014)   Borderline type 2 diabetes mellitus    patient denies   CHF (congestive heart failure) ([place])    Dysrhythmia    Hypertension    NSVT (nonsustained ventricular tachycardia) ([place]) 09/06/2016      Surgical History:  Past Surgical History:  Procedure Laterality Date   CARDIAC CATHETERIZATION N/A 08/27/2016   Procedure: Right/Left Heart Cath and Coronary Angiography;  Surgeon: Larey Dresser, MD;  Location: [facility];  Service: Cardiovascular;  Laterality: N/A;   COLONOSCOPY     CYSTECTOMY Right    "back of my shoulder"   RIGHT/LEFT HEART CATH AND CORONARY ANGIOGRAPHY N/A 12/04/2020   Procedure: RIGHT/LEFT HEART CATH AND CORONARY ANGIOGRAPHY;  Surgeon: Larey Dresser, MD;  Location: [facility];  Service:  Cardiovascular;  Laterality: N/A;   RIGHT/LEFT HEART CATH AND CORONARY ANGIOGRAPHY N/A 10/10/2021   Procedure: RIGHT/LEFT HEART CATH AND CORONARY ANGIOGRAPHY;  Surgeon: Larey Dresser, MD;  Location: [facility];  Service: Cardiovascular;  Laterality: N/A;   TONSILLECTOMY       Home Meds: No outpatient medications have been marked as taking for the 05/20/22 encounter (Appointment) with Deboraha Sprang, MD.    Allergies: No Known Allergies  Social History   Socioeconomic History   Marital status: Married    Spouse name: Not on file   Number of children: Not on file   Years of education: Not on file   Highest education level: Not on file  Occupational History   Occupation: Engineer, manufacturing systems    Comment: has not been able to work steadily for the last year or two  Tobacco Use   Smoking status: Every Day    Packs/day: 0.10    Years: 48.00    Total pack years: 4.80    Types: Cigarettes   Smokeless tobacco: Never   Tobacco comments:    cutting back 2  per day  Vaping Use   Vaping Use: Never used  Substance and Sexual Activity   Alcohol use: Yes    Alcohol/week: 1.0 standard drink of alcohol    Types: 1 Cans of beer  per week    Comment: occasionally   Drug use: Yes    Types: Marijuana    Comment: daily 4 times last 2 weeks ago as of 05/31/2019   Sexual activity: Yes    Birth control/protection: None  Other Topics Concern   Not on file  Social History Narrative   Not on file   Social Determinants of Health   Financial Resource Strain: Low Risk  (10/11/2021)   Overall Financial Resource Strain (CARDIA)    Difficulty of Paying Living Expenses: Not very hard  Food Insecurity: Food Insecurity Present (04/04/2022)   Hunger Vital Sign    Worried About Running Out of Food in the Last Year: Sometimes true    Ran Out of Food in the Last Year: Sometimes true  Transportation Needs: Unmet Transportation Needs (12/19/2021)   PRAPARE - Hydrologist  (Medical): Yes    Lack of Transportation (Non-Medical): No  Physical Activity: Not on file  Stress: Not on file  Social Connections: Not on file  Intimate Partner Violence: Not on file     Family History  Problem Relation Age of Onset   Stroke Mother 44   Heart disease Father 22   Colon cancer Neg Hx    Colon polyps Neg Hx    Esophageal cancer Neg Hx    Stomach cancer Neg Hx    Rectal cancer Neg Hx      ROS:  Please see the history of present illness.   {ros master:[id number]}  All other systems reviewed and negative.    Physical Exam:*** There were no vitals taken for this visit. General: Well developed, well nourished male in no acute distress. Head: Normocephalic, atraumatic, sclera non-icteric, no xanthomas, nares are without discharge. EENT: normal  Lymph Nodes:  none Neck: Negative for carotid bruits. JVD not elevated. Back:without scoliosis kyphosis*** Lungs: Clear bilaterally to auscultation without wheezes, rales, or rhonchi. Breathing is unlabored. Heart: RRR with S1 S2. No *** ***/6 systolic*** murmur . No rubs, or gallops appreciated. Abdomen: Soft, non-tender, non-distended with normoactive bowel sounds. No hepatomegaly. No rebound/guarding. No obvious abdominal masses. Msk:  Strength and tone appear normal for age. Extremities: No clubbing or cyanosis. No*** ***+*** edema.  Distal pedal pulses are 2+ and equal bilaterally. Skin: Warm and Dry Neuro: Alert and oriented X 3. CN III-XII intact Grossly normal sensory and motor function . Psych:  Responds to questions appropriately with a normal affect.        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

## 2022-05-20 NOTE — Telephone Encounter (Signed)
Spoke with pt re: his missed appointment today with Dr Caryl Comes.  Pt would like to reschedule appointment.

## 2022-05-22 ENCOUNTER — Telehealth (HOSPITAL_COMMUNITY): Payer: Self-pay

## 2022-05-22 NOTE — Telephone Encounter (Signed)
Spoke to [place] to confirm appointment for tomorrow in the clinic 10:00. He agreed and said he has a ride set up.   Call complete.   [name], [place] 05/22/2022

## 2022-05-23 ENCOUNTER — Ambulatory Visit (HOSPITAL_COMMUNITY)
Admission: RE | Admit: 2022-05-23 | Discharge: 2022-05-23 | Disposition: A | Discharge status: Home / Self Care | Payer: Medicare Other | Source: Ambulatory Visit | Attending: Internal Medicine | Admitting: Internal Medicine

## 2022-05-23 ENCOUNTER — Encounter (HOSPITAL_COMMUNITY): Payer: Self-pay

## 2022-05-23 ENCOUNTER — Other Ambulatory Visit (HOSPITAL_COMMUNITY): Payer: Self-pay

## 2022-05-23 VITALS — BP 100/70 | HR 70 | Wt 198.2 lb

## 2022-05-23 DIAGNOSIS — I2510 Atherosclerotic heart disease of native coronary artery without angina pectoris: Secondary | ICD-10-CM | POA: Insufficient documentation

## 2022-05-23 DIAGNOSIS — I428 Other cardiomyopathies: Secondary | ICD-10-CM | POA: Diagnosis not present

## 2022-05-23 DIAGNOSIS — N183 Chronic kidney disease, stage 3 unspecified: Secondary | ICD-10-CM | POA: Insufficient documentation

## 2022-05-23 DIAGNOSIS — F1410 Cocaine abuse, uncomplicated: Secondary | ICD-10-CM | POA: Diagnosis not present

## 2022-05-23 DIAGNOSIS — Z7984 Long term (current) use of oral hypoglycemic drugs: Secondary | ICD-10-CM | POA: Diagnosis not present

## 2022-05-23 DIAGNOSIS — Z87891 Personal history of nicotine dependence: Secondary | ICD-10-CM | POA: Insufficient documentation

## 2022-05-23 DIAGNOSIS — I447 Left bundle-branch block, unspecified: Secondary | ICD-10-CM | POA: Insufficient documentation

## 2022-05-23 DIAGNOSIS — Z79899 Other long term (current) drug therapy: Secondary | ICD-10-CM | POA: Diagnosis not present

## 2022-05-23 DIAGNOSIS — I5022 Chronic systolic (congestive) heart failure: Secondary | ICD-10-CM

## 2022-05-23 DIAGNOSIS — I5023 Acute on chronic systolic (congestive) heart failure: Secondary | ICD-10-CM | POA: Diagnosis not present

## 2022-05-23 DIAGNOSIS — E1122 Type 2 diabetes mellitus with diabetic chronic kidney disease: Secondary | ICD-10-CM | POA: Diagnosis not present

## 2022-05-23 DIAGNOSIS — I472 Ventricular tachycardia, unspecified: Secondary | ICD-10-CM | POA: Insufficient documentation

## 2022-05-23 DIAGNOSIS — I130 Hypertensive heart and chronic kidney disease with heart failure and stage 1 through stage 4 chronic kidney disease, or unspecified chronic kidney disease: Secondary | ICD-10-CM | POA: Diagnosis present

## 2022-05-23 DIAGNOSIS — I5082 Biventricular heart failure: Secondary | ICD-10-CM | POA: Insufficient documentation

## 2022-05-23 DIAGNOSIS — I4729 Other ventricular tachycardia: Secondary | ICD-10-CM

## 2022-05-23 LAB — LIPID PANEL
Cholesterol: 115 mg/dL (ref 0–200)
HDL: 29 mg/dL — ABNORMAL LOW (ref >40)
LDL Cholesterol: 77 mg/dL (ref 0–99)
Total CHOL/HDL Ratio: 4 RATIO
Triglycerides: 46 mg/dL (ref <150)
VLDL: 9 mg/dL (ref 0–40)

## 2022-05-23 LAB — BASIC METABOLIC PANEL
Anion gap: 5 (ref 5–15)
BUN: 26 mg/dL — ABNORMAL HIGH (ref 8–23)
CO2: 21 mmol/L — ABNORMAL LOW (ref 22–32)
Calcium: 8.9 mg/dL (ref 8.9–10.3)
Chloride: 113 mmol/L — ABNORMAL HIGH (ref 98–111)
Creatinine, Ser: 1.63 mg/dL — ABNORMAL HIGH (ref 0.61–1.24)
GFR, Estimated: 46 mL/min — ABNORMAL LOW (ref >60)
Glucose, Bld: 88 mg/dL (ref 70–99)
Potassium: 4.2 mmol/L (ref 3.5–5.1)
Sodium: 139 mmol/L (ref 135–145)

## 2022-05-23 LAB — DIGOXIN LEVEL: Digoxin Level: 0.5 ng/mL — ABNORMAL LOW (ref 0.8–2.0)

## 2022-05-23 NOTE — Patient Instructions (Signed)
It was great to see you today! No medication changes are needed at this time.   Labs today We will only contact you if something comes back abnormal or we need to make some changes. Otherwise no news is good news!   Your physician wants you to follow-up in: 3 months with Dr Aundra Dubin. You will receive a reminder letter in the mail two months in advance. If you don't receive a letter, please call our office to schedule the follow-up appointment.   Do the following things EVERYDAY: Weigh yourself in the morning before breakfast. Write it down and keep it in a log. Take your medicines as prescribed Eat low salt foods--Limit salt (sodium) to 2000 mg per day.  Stay as active as you can everyday Limit all fluids for the day to less than 2 liters   At the [facility], you and your health needs are our priority. As part of our continuing mission to provide you with exceptional heart care, we have created designated Provider Care Teams. These Care Teams include your primary Cardiologist (physician) and Advanced Practice Providers (APPs- Physician Assistants and Nurse Practitioners) who all work together to provide you with the care you need, when you need it.   You may see any of the following providers on your designated Care Team at your next follow up: Dr Glori Bickers Dr Loralie Champagne Dr. Roxana Hires, NP Lyda Jester, [place] [facility] [place], [place] Forestine Na, NP [name], PharmD   Please be sure to bring in all your medications bottles to every appointment.   If you have any questions or concerns before your next appointment please send [place] a message through [place] or call our office at [phone].    TO LEAVE A MESSAGE FOR THE NURSE SELECT OPTION 2, PLEASE LEAVE A MESSAGE INCLUDING: YOUR NAME DATE OF BIRTH CALL BACK NUMBER REASON FOR CALL**this is important as we prioritize the call backs  YOU WILL RECEIVE A CALL BACK THE  SAME DAY AS LONG AS YOU CALL BEFORE 4:00 PM

## 2022-05-23 NOTE — Progress Notes (Signed)
Advanced Heart Failure Clinic Note   Primary Care: Romana Juniper, MD HF Cardiologist: Dr. Aundra Dubin   HPI: Edward Mullen is a 66 y.o. male with history of HTN, DM, chronic systolic CHF, and polysubstance abuse.   Admitted 08/23/16 with acute systolic CHF in setting of substance abuse. Echo was done, showing EF 15-20%.  Diuresed with IV lasix and meds adjusted as tolerated.  UDS + for cocaine on admission. Cardiac cath showed coronary disease but not significant enough to explain cardiomyopathy. Down 23 lbs from admission weight with diuresis.  Discharge weight 193 lbs.  He was positive for cocaine again in 2/18.    He was admitted with syncope in 5/18 after using cocaine.  He was noted to have NSVT on telemetry.  Syncope suspected due to VT, no ICD given active substance abuse but had Lifevest placed. EF 50% on 8/18 echo, so Lifevest subsequently removed.  He started using cocaine again and stopped his cardiac medications. In 4/22, he was admitted with acute on chronic systolic CHF.  Frequent NSVT was noted and amiodarone was started.  He had echo showing EF 20-25% with normal RV.  LHC showed 80% PLV stenosis, managed medically.   Lost to f/u in the [facility]. Not seen back since 5/22.  Admitted 3/23 with a/c CHF and PNA. Echo with EF 20-25%, RV mildly reduced. Started on milrinone and IV lasix. RHC showed mildly elevated PCWP and LVEDP, CI 1.9. Milrinone weaned off and GDMT titrated. Had NSVT/VT, discharged home with LifeVest, weight 202 lbs.  Echo 8/23 showed EF 25-30%, global hypokinesis, moderate LV dilation, mild LVH, mildly decreased RV systolic function. Referred to EP for CRT-D consideration.  Today he returns for HF follow up, here with paramedic Heather. Overall feeling fine. He can walk short distances with his cane without significant dyspnea. Has mild SOB with ADLs and walking further distances on flat ground. Denies CP, dizziness, edema, or PND/Orthopnea. Appetite ok. No  fever or chills. Weight at home 200 pounds. Taking all medications. Continues to wear his Lifevest.  Remains quit from cocaine. No longer smoking cigarettes.   ECG (personally reviewed): NSR, LBBB 164 msec  LifeVest interrogation (personally reviewed): no treatments, 391 steps/day, average HR 75 bpm  Labs (1/18): Digoxin 0.4, BNP 961 Labs (2/18): K 4, creatinine 1.38 Labs (3/18): K 4.4, creatinine 1.85, digoxin 0.4 Labs (5/18): hgb 12.6, K 4.3, creatinine 1.3, digoxin 0.5 Labs (6/18): TSH normal, K 4.4, creatinine 2.04, AST 42, ALT 37 Labs (8/18): K 4.8, creatinine 1.65 Labs (11/18): K 4.1, creatinine 1.77 Labs (10/20): K 3.8, creatinine 1.33, hgb 14.9 Labs (4/22): digoxin 0.5, K 4.4, creatinine 1.5 Labs (3/23): K 3.8, creatinine 1.82 Labs (4/23): digoxin 0.7 Labs (5/23): K 4.2, creatinine 1.69 Labs (6/23): K 4.3, creatinine 1.46 Labs (8/23): K 4.2, creatinine 2.76 Labs (9/23): K 4.6, creatinine 1.62  ROS: All systems reviewed and negative except as per HPI.   Social History: Lives in [place] with daughter. Prior heavy ETOH, now quit.  Quit smoking 10/20. Has quit using cocaine.  Active marijuana.   Family History  Problem Relation Age of Onset   Stroke Mother 83   Heart disease Father 47   Colon cancer Neg Hx    Colon polyps Neg Hx    Esophageal cancer Neg Hx    Stomach cancer Neg Hx    Rectal cancer Neg Hx    Past Medical History: 1. HTN  2. Type II diabetes 3. Cocaine abuse 4. Active smoking, suspect COPD.  5.  Cardiomyopathy: Nonischemic.  - Echo (1/18) with EF 15-20%, moderate LVH, moderate AI, moderate to severe MR, normal RV size with mildly decreased systolic function, PASP 44 mmHg.  - LHC/RHC (1/18): 80% stenosis in PLV branch.  Mean RA 5, PA 47/20 mean 32, mean PCWP 22, CI 2.01.  - Echo (8/18): EF 50% with moderate LVH, diffuse hypokinesis, normal RV size and systolic function, mild AI, trivial MR.  - Echo (4/22): EF 20-25%, severe LV dilation, normal RV. -  Echo (2/23):  EF 20-25%, RV mildly reduced.  - Echo (8/23): EF 25-30%, global hypokinesis, moderate LV dilation, mild LVH, mildly decreased RV systolic function.  6. CAD: LHC (1/18) with 80% stenosis in a branch of the PLV (not enough CAD to explain cardiomyopathy).  - LHC (4/22): PLV 80%, 25% mLAD.  - R/LHC (2/23): 1st RPL 80%, 30% mLAD; mildly elevated PCWP and LVEDP, normal RA pressures, CI 1.9 7. Mitral regurgitation: Moderate to severe on 1/18 echo, probably functional.  Trivial MR on 8/18 echo.  8. CKD: Stage 3.  9. Syncope: 5/18 in setting of cocaine use, concern for VT.  10. Lower extremity arterial dopplers (7/18): No significant PAD.  11. Sciatica 12. NSVT 13. CKD stage 3  Current Outpatient Medications  Medication Sig Dispense Refill   albuterol (PROVENTIL) (2.5 MG/3ML) 0.083% nebulizer solution Take 3 mLs (2.5 mg total) by nebulization every 6 (six) hours as needed for shortness of breath or wheezing. 75 mL 2   albuterol (VENTOLIN HFA) 108 (90 Base) MCG/ACT inhaler INHALE 1 TO 2 PUFFS BY MOUTH EVERY 6 HOURS AS NEEDED FOR WHEEZING OR SHORTNESS OF BREATH 18 g 2   amiodarone (PACERONE) 200 MG tablet Take 1 tablet (200 mg total) by mouth daily. 30 tablet 3   aspirin EC 81 MG tablet Take 1 tablet (81 mg total) by mouth daily. Swallow whole. 30 tablet 11   bismuth subsalicylate (PEPTO BISMOL) 262 MG/15ML suspension Take 30 mLs by mouth every 6 (six) hours as needed for indigestion.     carvedilol (COREG) 3.125 MG tablet Take 1 tablet (3.125 mg total) by mouth 2 (two) times daily. 60 tablet 11   Cholecalciferol (VITAMIN D3) 50 MCG (2000 UT) TABS Take 1 tablet by mouth daily.     digoxin (LANOXIN) 0.125 MG tablet TAKE 1 TABLET (0.125 MG TOTAL) BY MOUTH DAILY. 90 tablet 1   empagliflozin (JARDIANCE) 10 MG TABS tablet Take 1 tablet (10 mg total) by mouth daily. 30 tablet 6   rosuvastatin (CRESTOR) 5 MG tablet TAKE 1 TABLET (5 MG TOTAL) BY MOUTH DAILY. 90 tablet 3   spironolactone  (ALDACTONE) 25 MG tablet Take 1 tablet (25 mg total) by mouth daily. 90 tablet 3   tamsulosin (FLOMAX) 0.4 MG CAPS capsule TAKE ONE CAPSULE BY MOUTH ONCE DAILY AFTER SUPPER 30 capsule 2   torsemide (DEMADEX) 20 MG tablet Take 1 tablet (20 mg total) by mouth daily as needed (extra fluid). 30 tablet 2   No current facility-administered medications for this encounter.   No Known Allergies  BP 100/70   Pulse 70   Wt 89.9 kg (198 lb 3.2 oz)   SpO2 94%   BMI 26.88 kg/m   Wt Readings from Last 3 Encounters:  05/23/22 89.9 kg (198 lb 3.2 oz)  05/09/22 90.3 kg (199 lb)  04/24/22 91.8 kg (202 lb 6.4 oz)    PHYSICAL EXAM: General:  NAD. No resp difficulty HEENT: Normal Neck: Supple. No JVD. Carotids 2+ bilat; no bruits. No lymphadenopathy  or thryomegaly appreciated. Cor: PMI nondisplaced. Regular rate & rhythm. No rubs, gallops or murmurs. Lungs: Clear Abdomen: Soft, nontender, nondistended. No hepatosplenomegaly. No bruits or masses. Good bowel sounds. Extremities: No cyanosis, clubbing, rash, edema Neuro: Alert & oriented x 3, cranial nerves grossly intact. Moves all 4 extremities w/o difficulty. Affect pleasant.  ASSESSMENT & PLAN: 1. Chronic Biventricular Systolic Heart Failure:  EF 15-20% with moderate to severe MR on echo 1/18.  Nonischemic cardiomyopathy. HTN vs cocaine use vs viral vs ETOH. HIV negative, SPEP with no M-spike.  Patient has been noted to have CAD but not enough to explain cardiomyopathy (Cath 2/23 admission with stable 80% PLV stenosis).  Echo in 8/18 showed EF up to 50%. However, he stopped his meds and started cocaine again, echo in 4/22 with EF back down to 20-25% => suspect cocaine abuse may be the primary etiology of cardiomyopathy. Echo in 2/23 with EF 20-25%, RV mildly reduced. RHC (3/23) with mildly elevated PCWP and LVEDP, CI 1.9.  He has stayed abstinent from cocaine for almost 6 months.  Echo (8/23) showed EF 25-30%, global hypokinesis, moderate LV dilation, mild  LVH, mildly decreased RV systolic function. Stable NYHA II symptoms, he is not volume overloaded today. GDMT recently decreased due to worsening kidney function (likely over-diuresis). - Losartan stopped with Scr 2.76. Kidney function improving, but no BP room to add today. BMET today. - Continue carvedilol 3.125 mg bid. - Continue torsemide 20 mg PRN - Continue digoxin 0.125 mg daily.  Check level today. - Continue Jardiance 10 mg daily. - Continue spiro 12.5 mg daily. - He is wearing Lifevest.  With persistently depressed EF and LBBB (and abstinent almost 6 months from cocaine), he has been referred to EP for CRT-D device.  - Continue paramedicine. 2. VT/NSVT: Multiple runs during 3/23 admission, longest was 28 beats. Coronary angiography showed no change in coronaries.  - Continue LifeVest until he gets CRT-D device. - Continue amiodarone 200 mg daily, recent LFTs and TSH normal. He should get regular eye exam.  3. CKD III: Baseline SCr 1.6. BMET today. 4. CAD: 80% stenosis PLV on cath in 2/23.  No chest pain. - Continue ASA. - Continue statin, check lipids today. 6. Cocaine abuse: He has quit for about 6 months.    Follow up in 3-4 months with Dr. Aundra Dubin. Keep appt with Dr. Caryl Comes to discuss CRT-D. Continue paramedicine for now, they are helping tremendously.  Maricela Bo Nora Ro[place]. 05/23/2022

## 2022-05-24 NOTE — Progress Notes (Signed)
Paramedicine Encounter    Patient ID: Edward Mullen, male    DOB: [birth year], 66 y.o.   MRN: [id number]   Met with Edward Mullen in the clinic today where he was reporting to be feeling okay but tired overall and stated he has had an episode recently on Sunday where he felt like he almost passed out after having a hot flash. He says this has happened a few times but and each time he has not been taken to the ER but was evaluated by EMS and all vitals were within normal range. He reports he has been wearing his Life Vest however he did not have it on at the time of that event or today during our visit.   Allena Katz, NP saw patient in clinic today where she made no med changes and obtained assessment and labs. Labs were stable no change.   Vitals were as noted: WT-198lbs BP-100/70 HR-70 O2-94% RR- 16    Refills needed: Albuterol Inhaler Nebulized Meds   Bubble packs confirmed and reviewed.    Allena Katz reported we can drop down to monthly paramedicine visits for [name] and he agreed with plan understanding to reach out to me when needed. He was also instructed and reminded to be sure she wears his Life Vest until he can get his ICD. He agreed.   He has been having trouble with his vision and his teeth so I provided him with a few options that are covered by his insurance to seek an appointment with.    Clinic visit complete.  I will follow up in one month unless otherwise needed.   Patient Care Team: Romana Juniper, MD as PCP - General Drema Pry as Social Worker  Patient Active Problem List   Diagnosis Date Noted   Hyperlipemia 04/04/2022   First time seizure [facility]) 12/28/2021   Type 2 diabetes mellitus with stage 3b chronic kidney disease, without long-term current use of insulin ([place]) 10/05/2021   Hemoptysis    Acute exacerbation of CHF (congestive heart failure) ([place]) 10/04/2021   Urinary retention    Scrotal mass 08/01/2020   Nocturia  08/01/2020   Lumbar spinal stenosis 06/08/2019   Healthcare maintenance 05/11/2019   Housing problems 05/11/2019   Difficulty urinating 04/07/2018   Colon cancer screening 04/07/2018   Anemia 12/19/2016   CKD (chronic kidney disease) stage 3, GFR 30-59 ml/min ([facility]) 12/13/2016   Acute on chronic systolic heart failure (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/2016   Mitral regurgitation 08/27/2016   Polysubstance abuse ([place])    Tobacco abuse 09/19/2014   HTN (hypertension) 03/28/2011   Bilateral lower extremity pain 03/28/2011    Current Outpatient Medications:    albuterol (PROVENTIL) (2.5 MG/3ML) 0.083% nebulizer solution, Take 3 mLs (2.5 mg total) by nebulization every 6 (six) hours as needed for shortness of breath or wheezing., Disp: 75 mL, Rfl: 2   albuterol (VENTOLIN HFA) 108 (90 Base) MCG/ACT inhaler, INHALE 1 TO 2 PUFFS BY MOUTH EVERY 6 HOURS AS NEEDED FOR WHEEZING OR SHORTNESS OF BREATH, Disp: 18 g, Rfl: 2   amiodarone (PACERONE) 200 MG tablet, Take 1 tablet (200 mg total) by mouth daily., Disp: 30 tablet, Rfl: 3   aspirin EC 81 MG tablet, Take 1 tablet (81 mg total) by mouth daily. Swallow whole., Disp: 30 tablet, Rfl: 11   bismuth subsalicylate (PEPTO BISMOL) 262 MG/15ML suspension, Take 30 mLs by mouth every 6 (six) hours  as needed for indigestion., Disp: , Rfl:    carvedilol (COREG) 3.125 MG tablet, Take 1 tablet (3.125 mg total) by mouth 2 (two) times daily., Disp: 60 tablet, Rfl: 11   Cholecalciferol (VITAMIN D3) 50 MCG (2000 UT) TABS, Take 1 tablet by mouth daily., Disp: , Rfl:    digoxin (LANOXIN) 0.125 MG tablet, TAKE 1 TABLET (0.125 MG TOTAL) BY MOUTH DAILY., Disp: 90 tablet, Rfl: 1   empagliflozin (JARDIANCE) 10 MG TABS tablet, Take 1 tablet (10 mg total) by mouth daily., Disp: 30 tablet, Rfl: 6   rosuvastatin (CRESTOR) 5 MG tablet, TAKE 1 TABLET (5 MG TOTAL) BY MOUTH DAILY., Disp: 90  tablet, Rfl: 3   spironolactone (ALDACTONE) 25 MG tablet, Take 1 tablet (25 mg total) by mouth daily., Disp: 90 tablet, Rfl: 3   tamsulosin (FLOMAX) 0.4 MG CAPS capsule, TAKE ONE CAPSULE BY MOUTH ONCE DAILY AFTER SUPPER, Disp: 30 capsule, Rfl: 2   torsemide (DEMADEX) 20 MG tablet, Take 1 tablet (20 mg total) by mouth daily as needed (extra fluid)., Disp: 30 tablet, Rfl: 2 No Known Allergies   Social History   Socioeconomic History   Marital status: Married    Spouse name: Not on file   Number of children: Not on file   Years of education: Not on file   Highest education level: Not on file  Occupational History   Occupation: Engineer, manufacturing systems    Comment: has not been able to work steadily for the last year or two  Tobacco Use   Smoking status: Every Day    Packs/day: 0.10    Years: 48.00    Total pack years: 4.80    Types: Cigarettes   Smokeless tobacco: Never   Tobacco comments:    cutting back 2  per day  Vaping Use   Vaping Use: Never used  Substance and Sexual Activity   Alcohol use: Yes    Alcohol/week: 1.0 standard drink of alcohol    Types: 1 Cans of beer per week    Comment: occasionally   Drug use: Yes    Types: Marijuana    Comment: daily 4 times last 2 weeks ago as of 05/31/2019   Sexual activity: Yes    Birth control/protection: None  Other Topics Concern   Not on file  Social History Narrative   Not on file   Social Determinants of Health   Financial Resource Strain: Low Risk  (10/11/2021)   Overall Financial Resource Strain (CARDIA)    Difficulty of Paying Living Expenses: Not very hard  Food Insecurity: Food Insecurity Present (04/04/2022)   Hunger Vital Sign    Worried About Running Out of Food in the Last Year: Sometimes true    Ran Out of Food in the Last Year: Sometimes true  Transportation Needs: Unmet Transportation Needs (12/19/2021)   PRAPARE - Hydrologist (Medical): Yes    Lack of Transportation (Non-Medical): No   Physical Activity: Not on file  Stress: Not on file  Social Connections: Not on file  Intimate Partner Violence: Not on file    Physical Exam      Future Appointments  Date Time Provider [place]  06/13/2022  2:30 PM Deboraha Sprang, MD [facility]     ACTION: Home visit completed

## 2022-06-06 ENCOUNTER — Emergency Department (HOSPITAL_COMMUNITY): Payer: Medicare Other

## 2022-06-06 ENCOUNTER — Inpatient Hospital Stay (HOSPITAL_COMMUNITY)
Admission: EM | Admit: 2022-06-06 | Discharge: 2022-06-09 | DRG: 291 | Disposition: A | Discharge status: Home / Self Care | Payer: Medicare Other | Attending: Internal Medicine | Admitting: Internal Medicine

## 2022-06-06 DIAGNOSIS — I5082 Biventricular heart failure: Secondary | ICD-10-CM | POA: Diagnosis present

## 2022-06-06 DIAGNOSIS — I5023 Acute on chronic systolic (congestive) heart failure: Secondary | ICD-10-CM | POA: Diagnosis present

## 2022-06-06 DIAGNOSIS — E1122 Type 2 diabetes mellitus with diabetic chronic kidney disease: Secondary | ICD-10-CM | POA: Diagnosis present

## 2022-06-06 DIAGNOSIS — J9601 Acute respiratory failure with hypoxia: Secondary | ICD-10-CM | POA: Diagnosis present

## 2022-06-06 DIAGNOSIS — R0902 Hypoxemia: Secondary | ICD-10-CM

## 2022-06-06 DIAGNOSIS — E785 Hyperlipidemia, unspecified: Secondary | ICD-10-CM | POA: Diagnosis present

## 2022-06-06 DIAGNOSIS — I130 Hypertensive heart and chronic kidney disease with heart failure and stage 1 through stage 4 chronic kidney disease, or unspecified chronic kidney disease: Principal | ICD-10-CM | POA: Diagnosis present

## 2022-06-06 DIAGNOSIS — D696 Thrombocytopenia, unspecified: Secondary | ICD-10-CM | POA: Diagnosis present

## 2022-06-06 DIAGNOSIS — E119 Type 2 diabetes mellitus without complications: Secondary | ICD-10-CM | POA: Diagnosis present

## 2022-06-06 DIAGNOSIS — N1831 Chronic kidney disease, stage 3a: Secondary | ICD-10-CM | POA: Diagnosis present

## 2022-06-06 DIAGNOSIS — D631 Anemia in chronic kidney disease: Secondary | ICD-10-CM | POA: Diagnosis present

## 2022-06-06 DIAGNOSIS — J810 Acute pulmonary edema: Secondary | ICD-10-CM | POA: Diagnosis not present

## 2022-06-06 DIAGNOSIS — Z79899 Other long term (current) drug therapy: Secondary | ICD-10-CM

## 2022-06-06 DIAGNOSIS — Z87891 Personal history of nicotine dependence: Secondary | ICD-10-CM

## 2022-06-06 DIAGNOSIS — I509 Heart failure, unspecified: Secondary | ICD-10-CM

## 2022-06-06 DIAGNOSIS — N1832 Chronic kidney disease, stage 3b: Secondary | ICD-10-CM | POA: Diagnosis present

## 2022-06-06 DIAGNOSIS — J441 Chronic obstructive pulmonary disease with (acute) exacerbation: Secondary | ICD-10-CM | POA: Diagnosis present

## 2022-06-06 DIAGNOSIS — Z8249 Family history of ischemic heart disease and other diseases of the circulatory system: Secondary | ICD-10-CM

## 2022-06-06 DIAGNOSIS — R0603 Acute respiratory distress: Principal | ICD-10-CM

## 2022-06-06 DIAGNOSIS — Z7151 Drug abuse counseling and surveillance of drug abuser: Secondary | ICD-10-CM

## 2022-06-06 DIAGNOSIS — F1490 Cocaine use, unspecified, uncomplicated: Secondary | ICD-10-CM | POA: Diagnosis present

## 2022-06-06 DIAGNOSIS — E872 Acidosis, unspecified: Secondary | ICD-10-CM | POA: Diagnosis present

## 2022-06-06 DIAGNOSIS — Z7982 Long term (current) use of aspirin: Secondary | ICD-10-CM

## 2022-06-06 DIAGNOSIS — H538 Other visual disturbances: Secondary | ICD-10-CM | POA: Diagnosis not present

## 2022-06-06 DIAGNOSIS — J449 Chronic obstructive pulmonary disease, unspecified: Secondary | ICD-10-CM | POA: Diagnosis present

## 2022-06-06 DIAGNOSIS — Z7984 Long term (current) use of oral hypoglycemic drugs: Secondary | ICD-10-CM

## 2022-06-06 DIAGNOSIS — N183 Chronic kidney disease, stage 3 unspecified: Secondary | ICD-10-CM | POA: Diagnosis present

## 2022-06-06 LAB — I-STAT VENOUS BLOOD GAS, ED
Acid-base deficit: 3 mmol/L — ABNORMAL HIGH (ref 0.0–2.0)
Bicarbonate: 20.3 mmol/L (ref 20.0–28.0)
Calcium, Ion: 1.06 mmol/L — ABNORMAL LOW (ref 1.15–1.40)
HCT: 41 % (ref 39.0–52.0)
Hemoglobin: 13.9 g/dL (ref 13.0–17.0)
O2 Saturation: 93 %
Potassium: 4.6 mmol/L (ref 3.5–5.1)
Sodium: 137 mmol/L (ref 135–145)
TCO2: 21 mmol/L — ABNORMAL LOW (ref 22–32)
pCO2, Ven: 30.3 mmHg — ABNORMAL LOW (ref 44–60)
pH, Ven: 7.435 — ABNORMAL HIGH (ref 7.25–7.43)
pO2, Ven: 63 mmHg — ABNORMAL HIGH (ref 32–45)

## 2022-06-06 MED ORDER — ALBUTEROL (5 MG/ML) CONTINUOUS INHALATION SOLN
15.0000 mg | INHALATION_SOLUTION | Freq: Once | RESPIRATORY_TRACT | Status: AC
  Administered 2022-06-07: 15 mg via RESPIRATORY_TRACT
  Filled 2022-06-06: qty 20

## 2022-06-06 MED ORDER — ALBUTEROL SULFATE (2.5 MG/3ML) 0.083% IN NEBU: 15.0000 mg | INHALATION_SOLUTION | Freq: Once | RESPIRATORY_TRACT | Status: DC

## 2022-06-06 MED ORDER — IPRATROPIUM BROMIDE 0.02 % IN SOLN
1.0000 mg | Freq: Once | RESPIRATORY_TRACT | Status: AC
  Administered 2022-06-07: 1 mg via RESPIRATORY_TRACT
  Filled 2022-06-06: qty 5

## 2022-06-06 NOTE — ED Triage Notes (Incomplete)
RT

## 2022-06-06 NOTE — ED Triage Notes (Signed)
RT at bedside to place pt on BiPap machine

## 2022-06-06 NOTE — ED Provider Notes (Signed)
I performed the initial screening exam.  66 year old male with a history of suspected COPD, HTN, DM, CHF, and polysubstance abuse who comes into the emergency department for shortness of breath.  Patient reports that he started having shortness of breath shortly prior to his son calling 911.  Per EMS his son reported he was short of breath and then slumped over and passed out.  When they arrived she was satting 60% on room air they gave him Solu-Medrol, magnesium, and nebulizer and on BiPAP with improvement of his saturations and mental status.  On exam the patient is tachypneic with retractions.  Also with diffuse wheezing so suspect COPD exacerbation.  We will start the patient on BiPAP and continuous albuterol.  See oncoming attending physician note for additional details.   Fransico Meadow, MD 06/06/22 2245

## 2022-06-06 NOTE — ED Provider Notes (Signed)
[facility] EMERGENCY DEPARTMENT Provider Note   CSN: [id number] Arrival date & time: 06/06/22  2239     History {Add pertinent medical, surgical, social history, OB history to HPI:1} Chief Complaint  Patient presents with  . Respiratory Distress    Edward Mullen is a 66 y.o. male.  66 yo M here with dyspnea. Apparently patient started feeling bad this afternoon. Was trying his inhaler quite a bit which did not seem to help. Then tried to use his nebulizer but got lightheaded so called EMS. On their arrival the patient was significantly hypoxic. Started on bipap. Mag, solumedrol and albuterol en route reportedly (previous provider note). No fever. Some productive cough of white sputum. States he is compliant with medications. Usually with chf will have BLE edema but doesn't feel they are much worse than normal today. Also had a nosebleed earlier. Stopped after about half an hour of pressure.        Home Medications Prior to Admission medications   Medication Sig Start Date End Date Taking? Authorizing Provider  albuterol (PROVENTIL) (2.5 MG/3ML) 0.083% nebulizer solution Take 3 mLs (2.5 mg total) by nebulization every 6 (six) hours as needed for shortness of breath or wheezing. 10/19/21   Lajean Manes, MD  albuterol (VENTOLIN HFA) 108 (90 Base) MCG/ACT inhaler INHALE 1 TO 2 PUFFS BY MOUTH EVERY 6 HOURS AS NEEDED FOR WHEEZING OR SHORTNESS OF BREATH 07/30/21   Harvie Heck, MD  amiodarone (PACERONE) 200 MG tablet Take 1 tablet (200 mg total) by mouth daily. 10/11/21   Farrel Gordon, DO  aspirin EC 81 MG tablet Take 1 tablet (81 mg total) by mouth daily. Swallow whole. 01/29/22 01/30/23  Rafael Bihari, FNP  bismuth subsalicylate (PEPTO BISMOL) 262 MG/15ML suspension Take 30 mLs by mouth every 6 (six) hours as needed for indigestion.    [provider]  carvedilol (COREG) 3.125 MG tablet Take 1 tablet (3.125 mg total) by mouth 2 (two) times daily. 01/29/22  01/29/23  Rafael Bihari, FNP  Cholecalciferol (VITAMIN D3) 50 MCG (2000 UT) TABS Take 1 tablet by mouth daily.    [provider]  digoxin (LANOXIN) 0.125 MG tablet TAKE 1 TABLET (0.125 MG TOTAL) BY MOUTH DAILY. 04/26/21   Larey Dresser, MD  empagliflozin (JARDIANCE) 10 MG TABS tablet Take 1 tablet (10 mg total) by mouth daily. 10/19/21   Lajean Manes, MD  rosuvastatin (CRESTOR) 5 MG tablet TAKE 1 TABLET (5 MG TOTAL) BY MOUTH DAILY. 12/26/20   Larey Dresser, MD  spironolactone (ALDACTONE) 25 MG tablet Take 1 tablet (25 mg total) by mouth daily. 04/24/22   Larey Dresser, MD  tamsulosin (FLOMAX) 0.4 MG CAPS capsule TAKE ONE CAPSULE BY MOUTH ONCE DAILY AFTER SUPPER 02/20/21   Riesa Pope, MD  torsemide (DEMADEX) 20 MG tablet Take 1 tablet (20 mg total) by mouth daily as needed (extra fluid). 04/24/22   Larey Dresser, MD      Allergies    Patient has no known allergies.    Review of Systems   Review of Systems  Physical Exam Updated Vital Signs BP 107/76   Pulse 68   Temp 97.8 F (36.6 C) (Oral)   Resp 18   SpO2 95%  Physical Exam Vitals and nursing note reviewed.  Constitutional:      Appearance: He is well-developed.  HENT:     Head: Normocephalic and atraumatic.     Mouth/Throat:     Mouth: Mucous membranes are  dry.  Eyes:     Pupils: Pupils are equal, round, and reactive to light.  Cardiovascular:     Rate and Rhythm: Tachycardia present.  Pulmonary:     Effort: Pulmonary effort is normal. No respiratory distress.     Breath sounds: Wheezing and rales present.  Abdominal:     General: Abdomen is flat. There is no distension.  Musculoskeletal:        General: Normal range of motion.     Cervical back: Normal range of motion.     Right lower leg: No edema.     Left lower leg: No edema.  Skin:    General: Skin is warm and dry.  Neurological:     General: No focal deficit present.     Mental Status: He is alert.    ED Results / Procedures  / Treatments   Labs (all labs ordered are listed, but only abnormal results are displayed) Labs Reviewed  CBC WITH DIFFERENTIAL/PLATELET - Abnormal; Notable for the following components:      Result Value   Hemoglobin 12.4 (*)    RDW 16.4 (*)    Platelets 149 (*)    Lymphs Abs 0.4 (*)    All other components within normal limits  COMPREHENSIVE METABOLIC PANEL - Abnormal; Notable for the following components:   CO2 19 (*)    Glucose, Bld 122 (*)    Creatinine, Ser 1.49 (*)    Calcium 8.2 (*)    Total Protein 6.0 (*)    Albumin 2.9 (*)    GFR, Estimated 51 (*)    All other components within normal limits  BRAIN NATRIURETIC PEPTIDE - Abnormal; Notable for the following components:   B Natriuretic Peptide 2,548.2 (*)    All other components within normal limits  DIGOXIN LEVEL - Abnormal; Notable for the following components:   Digoxin Level 0.4 (*)    All other components within normal limits  I-STAT VENOUS BLOOD GAS, ED - Abnormal; Notable for the following components:   pH, Ven 7.435 (*)    pCO2, Ven 30.3 (*)    pO2, Ven 63 (*)    TCO2 21 (*)    Acid-base deficit 3.0 (*)    Calcium, Ion 1.06 (*)    All other components within normal limits  TROPONIN I (HIGH SENSITIVITY) - Abnormal; Notable for the following components:   Troponin I (High Sensitivity) 66 (*)    All other components within normal limits  TROPONIN I (HIGH SENSITIVITY) - Abnormal; Notable for the following components:   Troponin I (High Sensitivity) 50 (*)    All other components within normal limits    EKG EKG Interpretation  Date/Time:  Thursday June 06 2022 22:40:06 EDT Ventricular Rate:  92 PR Interval:  219 QRS Duration: 165 QT Interval:  414 QTC Calculation: 513 R Axis:   -70 Text Interpretation: Sinus rhythm Multiple ventricular premature complexes Prolonged PR interval Left bundle branch block Confirmed by Margaretmary Eddy [phone]) on 06/06/2022 10:42:56 PM  Radiology DG Chest Port 1  View  Result Date: 06/06/2022 CLINICAL DATA:  Shortness of breath EXAM: PORTABLE CHEST 1 VIEW COMPARISON:  10/07/2021 FINDINGS: Cardiac shadow is enlarged but stable. The lungs are well aerated bilaterally. No focal infiltrate is seen. No acute bony abnormality is noted. IMPRESSION: No active disease. Electronically Signed   By: Inez Catalina M.D.   On: 06/06/2022 22:59    Procedures .Critical Care  Performed by: Merrily Pew, MD Authorized by: Merrily Pew, MD  Critical care provider statement:    Critical care time (minutes):  30   Critical care was necessary to treat or prevent imminent or life-threatening deterioration of the following conditions:  Respiratory failure and cardiac failure   Critical care was time spent personally by me on the following activities:  Development of treatment plan with patient or surrogate, discussions with consultants, evaluation of patient's response to treatment, examination of patient, ordering and review of laboratory studies, ordering and review of radiographic studies, ordering and performing treatments and interventions, pulse oximetry, re-evaluation of patient's condition and review of old charts   I assumed direction of critical care for this patient from another provider in my specialty: no     Medications Ordered in ED Medications  furosemide (LASIX) injection 20 mg (has no administration in time range)  ipratropium (ATROVENT) nebulizer solution 1 mg (1 mg Nebulization Given 06/07/22 0024)  albuterol (PROVENTIL,VENTOLIN) solution continuous neb (15 mg Nebulization Given 06/07/22 0023)    ED Course/ Medical Decision Making/ A&P Clinical Course as of 06/07/22 0553  Thu Jun 06, 2022  2259 CXR done and showed interstitial changes bilaterally in lower lobes (independently viewed and interpreted by myself and radiology read reviewed). Suspect likely some component of pulmonary edema [JM]    Clinical Course User Index [JM] Maruice Pieroni, Corene Cornea, MD                            Medical Decision Making Amount and/or Complexity of Data Reviewed Labs: ordered.  Risk Prescription drug management. Decision regarding hospitalization.  COPD with possible component of pulmonary edema as well. On bipap. Getting continuous albuterol. Ecg with bbb but no obvious changes from previous.  Bnp up. Trop up mildly likely related to CHF. Soft pressures so started with low lasix.  Bipap weaned off, still requiring 3L [place].  Discussed with IMTS for admission to progressive.   Final Clinical Impression(s) / ED Diagnoses Final diagnoses:  Respiratory distress  Acute pulmonary edema ([place])  Hypoxia    Rx / DC Orders ED Discharge Orders     None

## 2022-06-07 ENCOUNTER — Encounter (HOSPITAL_COMMUNITY): Payer: Self-pay | Admitting: Student in an Organized Health Care Education/Training Program

## 2022-06-07 ENCOUNTER — Inpatient Hospital Stay (HOSPITAL_COMMUNITY): Payer: Medicare Other

## 2022-06-07 ENCOUNTER — Other Ambulatory Visit: Payer: Self-pay

## 2022-06-07 DIAGNOSIS — E785 Hyperlipidemia, unspecified: Secondary | ICD-10-CM | POA: Diagnosis present

## 2022-06-07 DIAGNOSIS — I5082 Biventricular heart failure: Secondary | ICD-10-CM | POA: Diagnosis present

## 2022-06-07 DIAGNOSIS — D631 Anemia in chronic kidney disease: Secondary | ICD-10-CM | POA: Diagnosis present

## 2022-06-07 DIAGNOSIS — Z8249 Family history of ischemic heart disease and other diseases of the circulatory system: Secondary | ICD-10-CM | POA: Diagnosis not present

## 2022-06-07 DIAGNOSIS — D696 Thrombocytopenia, unspecified: Secondary | ICD-10-CM | POA: Diagnosis present

## 2022-06-07 DIAGNOSIS — J441 Chronic obstructive pulmonary disease with (acute) exacerbation: Secondary | ICD-10-CM | POA: Diagnosis present

## 2022-06-07 DIAGNOSIS — I130 Hypertensive heart and chronic kidney disease with heart failure and stage 1 through stage 4 chronic kidney disease, or unspecified chronic kidney disease: Secondary | ICD-10-CM | POA: Diagnosis present

## 2022-06-07 DIAGNOSIS — Z87891 Personal history of nicotine dependence: Secondary | ICD-10-CM | POA: Diagnosis not present

## 2022-06-07 DIAGNOSIS — I5023 Acute on chronic systolic (congestive) heart failure: Secondary | ICD-10-CM | POA: Diagnosis present

## 2022-06-07 DIAGNOSIS — J810 Acute pulmonary edema: Secondary | ICD-10-CM | POA: Diagnosis present

## 2022-06-07 DIAGNOSIS — F17210 Nicotine dependence, cigarettes, uncomplicated: Secondary | ICD-10-CM | POA: Diagnosis not present

## 2022-06-07 DIAGNOSIS — Z7151 Drug abuse counseling and surveillance of drug abuser: Secondary | ICD-10-CM | POA: Diagnosis not present

## 2022-06-07 DIAGNOSIS — F1490 Cocaine use, unspecified, uncomplicated: Secondary | ICD-10-CM | POA: Diagnosis present

## 2022-06-07 DIAGNOSIS — N1831 Chronic kidney disease, stage 3a: Secondary | ICD-10-CM

## 2022-06-07 DIAGNOSIS — Z79899 Other long term (current) drug therapy: Secondary | ICD-10-CM | POA: Diagnosis not present

## 2022-06-07 DIAGNOSIS — Z7982 Long term (current) use of aspirin: Secondary | ICD-10-CM | POA: Diagnosis not present

## 2022-06-07 DIAGNOSIS — J449 Chronic obstructive pulmonary disease, unspecified: Secondary | ICD-10-CM | POA: Diagnosis present

## 2022-06-07 DIAGNOSIS — E872 Acidosis, unspecified: Secondary | ICD-10-CM | POA: Diagnosis present

## 2022-06-07 DIAGNOSIS — J9601 Acute respiratory failure with hypoxia: Secondary | ICD-10-CM | POA: Diagnosis present

## 2022-06-07 DIAGNOSIS — H538 Other visual disturbances: Secondary | ICD-10-CM | POA: Diagnosis not present

## 2022-06-07 DIAGNOSIS — E1122 Type 2 diabetes mellitus with diabetic chronic kidney disease: Secondary | ICD-10-CM | POA: Diagnosis present

## 2022-06-07 DIAGNOSIS — Z7984 Long term (current) use of oral hypoglycemic drugs: Secondary | ICD-10-CM | POA: Diagnosis not present

## 2022-06-07 LAB — ECHOCARDIOGRAM COMPLETE
AR max vel: 2.54 cm2
AV Area VTI: 2.68 cm2
AV Area mean vel: 2.8 cm2
AV Mean grad: 9 mmHg
AV Peak grad: 17.1 mmHg
Ao pk vel: 2.07 m/s
Area-P 1/2: 3.31 cm2
Calc EF: 29.8 %
MV M vel: 4.17 m/s
MV Peak grad: 69.6 mmHg
P 1/2 time: 631 msec
Radius: 0.6 cm
S' Lateral: 6.2 cm
Single Plane A2C EF: 25 %
Single Plane A4C EF: 33.9 %

## 2022-06-07 LAB — CBC WITH DIFFERENTIAL/PLATELET
Abs Immature Granulocytes: 0.01 10*3/uL (ref 0.00–0.07)
Basophils Absolute: 0 10*3/uL (ref 0.0–0.1)
Basophils Relative: 0 %
Eosinophils Absolute: 0 10*3/uL (ref 0.0–0.5)
Eosinophils Relative: 0 %
HCT: 39 % (ref 39.0–52.0)
Hemoglobin: 12.4 g/dL — ABNORMAL LOW (ref 13.0–17.0)
Immature Granulocytes: 0 %
Lymphocytes Relative: 6 %
Lymphs Abs: 0.4 10*3/uL — ABNORMAL LOW (ref 0.7–4.0)
MCH: 28.2 pg (ref 26.0–34.0)
MCHC: 31.8 g/dL (ref 30.0–36.0)
MCV: 88.8 fL (ref 80.0–100.0)
Monocytes Absolute: 0.3 10*3/uL (ref 0.1–1.0)
Monocytes Relative: 5 %
Neutro Abs: 5.9 10*3/uL (ref 1.7–7.7)
Neutrophils Relative %: 89 %
Platelets: 149 10*3/uL — ABNORMAL LOW (ref 150–400)
RBC: 4.39 MIL/uL (ref 4.22–5.81)
RDW: 16.4 % — ABNORMAL HIGH (ref 11.5–15.5)
WBC: 6.6 10*3/uL (ref 4.0–10.5)
nRBC: 0 % (ref 0.0–0.2)

## 2022-06-07 LAB — COMPREHENSIVE METABOLIC PANEL
ALT: 30 U/L (ref 0–44)
AST: 31 U/L (ref 15–41)
Albumin: 2.9 g/dL — ABNORMAL LOW (ref 3.5–5.0)
Alkaline Phosphatase: 70 U/L (ref 38–126)
Anion gap: 14 (ref 5–15)
BUN: 18 mg/dL (ref 8–23)
CO2: 19 mmol/L — ABNORMAL LOW (ref 22–32)
Calcium: 8.2 mg/dL — ABNORMAL LOW (ref 8.9–10.3)
Chloride: 106 mmol/L (ref 98–111)
Creatinine, Ser: 1.49 mg/dL — ABNORMAL HIGH (ref 0.61–1.24)
GFR, Estimated: 51 mL/min — ABNORMAL LOW (ref >60)
Glucose, Bld: 122 mg/dL — ABNORMAL HIGH (ref 70–99)
Potassium: 3.8 mmol/L (ref 3.5–5.1)
Sodium: 139 mmol/L (ref 135–145)
Total Bilirubin: 1.1 mg/dL (ref 0.3–1.2)
Total Protein: 6 g/dL — ABNORMAL LOW (ref 6.5–8.1)

## 2022-06-07 LAB — TROPONIN I (HIGH SENSITIVITY)
Troponin I (High Sensitivity): 66 ng/L — ABNORMAL HIGH (ref <18)
Troponin I (High Sensitivity): 50 ng/L — ABNORMAL HIGH (ref <18)

## 2022-06-07 LAB — BRAIN NATRIURETIC PEPTIDE: B Natriuretic Peptide: 2548.2 pg/mL — ABNORMAL HIGH (ref 0.0–100.0)

## 2022-06-07 LAB — CBG MONITORING, ED
Glucose-Capillary: 183 mg/dL — ABNORMAL HIGH (ref 70–99)
Glucose-Capillary: 123 mg/dL — ABNORMAL HIGH (ref 70–99)
Glucose-Capillary: 112 mg/dL — ABNORMAL HIGH (ref 70–99)
Glucose-Capillary: 112 mg/dL — ABNORMAL HIGH (ref 70–99)

## 2022-06-07 LAB — MAGNESIUM: Magnesium: 2 mg/dL (ref 1.7–2.4)

## 2022-06-07 LAB — RAPID URINE DRUG SCREEN, HOSP PERFORMED
Amphetamines: NOT DETECTED
Barbiturates: NOT DETECTED
Benzodiazepines: NOT DETECTED
Cocaine: POSITIVE — AB
Opiates: NOT DETECTED
Tetrahydrocannabinol: POSITIVE — AB

## 2022-06-07 LAB — DIGOXIN LEVEL: Digoxin Level: 0.4 ng/mL — ABNORMAL LOW (ref 0.8–2.0)

## 2022-06-07 LAB — HIV ANTIBODY (ROUTINE TESTING W REFLEX): HIV Screen 4th Generation wRfx: NONREACTIVE

## 2022-06-07 MED ORDER — DIGOXIN 125 MCG PO TABS
0.1250 mg | ORAL_TABLET | Freq: Every day | ORAL | Status: DC
  Administered 2022-06-07 – 2022-06-09 (×3): 0.125 mg via ORAL
  Filled 2022-06-07 (×3): qty 1

## 2022-06-07 MED ORDER — PERFLUTREN LIPID MICROSPHERE
1.0000 mL | INTRAVENOUS | Status: AC | PRN
  Administered 2022-06-07: 2 mL via INTRAVENOUS

## 2022-06-07 MED ORDER — ENOXAPARIN SODIUM 30 MG/0.3ML IJ SOSY
30.0000 mg | PREFILLED_SYRINGE | INTRAMUSCULAR | Status: DC
  Administered 2022-06-07 – 2022-06-09 (×3): 30 mg via SUBCUTANEOUS
  Filled 2022-06-07 (×3): qty 0.3

## 2022-06-07 MED ORDER — FUROSEMIDE 10 MG/ML IJ SOLN
60.0000 mg | Freq: Two times a day (BID) | INTRAMUSCULAR | Status: DC
  Administered 2022-06-07 – 2022-06-08 (×2): 60 mg via INTRAVENOUS
  Filled 2022-06-07 (×2): qty 6

## 2022-06-07 MED ORDER — INSULIN ASPART 100 UNIT/ML IJ SOLN: 0.0000 [IU] | Freq: Three times a day (TID) | INTRAMUSCULAR | Status: DC

## 2022-06-07 MED ORDER — EMPAGLIFLOZIN 10 MG PO TABS: 10.0000 mg | ORAL_TABLET | Freq: Every day | ORAL | Status: DC

## 2022-06-07 MED ORDER — ROSUVASTATIN CALCIUM 5 MG PO TABS
5.0000 mg | ORAL_TABLET | Freq: Every day | ORAL | Status: DC
  Administered 2022-06-07 – 2022-06-09 (×3): 5 mg via ORAL
  Filled 2022-06-07 (×3): qty 1

## 2022-06-07 MED ORDER — INSULIN ASPART 100 UNIT/ML IJ SOLN: 0.0000 [IU] | Freq: Every day | INTRAMUSCULAR | Status: DC

## 2022-06-07 MED ORDER — IPRATROPIUM-ALBUTEROL 0.5-2.5 (3) MG/3ML IN SOLN: 3.0000 mL | Freq: Four times a day (QID) | RESPIRATORY_TRACT | Status: DC | PRN

## 2022-06-07 MED ORDER — FUROSEMIDE 10 MG/ML IJ SOLN
40.0000 mg | Freq: Once | INTRAMUSCULAR | Status: AC
  Administered 2022-06-07: 40 mg via INTRAVENOUS
  Filled 2022-06-07: qty 4

## 2022-06-07 MED ORDER — PREDNISONE 20 MG PO TABS
40.0000 mg | ORAL_TABLET | Freq: Every day | ORAL | Status: DC
  Administered 2022-06-08 – 2022-06-09 (×2): 40 mg via ORAL
  Filled 2022-06-07 (×3): qty 2

## 2022-06-07 MED ORDER — AMIODARONE HCL 200 MG PO TABS
200.0000 mg | ORAL_TABLET | Freq: Every day | ORAL | Status: DC
  Administered 2022-06-07 – 2022-06-09 (×3): 200 mg via ORAL
  Filled 2022-06-07 (×3): qty 1

## 2022-06-07 MED ORDER — ASPIRIN 81 MG PO TBEC
81.0000 mg | DELAYED_RELEASE_TABLET | Freq: Every day | ORAL | Status: DC
  Administered 2022-06-07 – 2022-06-09 (×3): 81 mg via ORAL
  Filled 2022-06-07 (×3): qty 1

## 2022-06-07 MED ORDER — FUROSEMIDE 10 MG/ML IJ SOLN
20.0000 mg | Freq: Once | INTRAMUSCULAR | Status: AC
  Administered 2022-06-07: 20 mg via INTRAVENOUS
  Filled 2022-06-07: qty 2

## 2022-06-07 MED ORDER — IPRATROPIUM-ALBUTEROL 0.5-2.5 (3) MG/3ML IN SOLN
3.0000 mL | Freq: Four times a day (QID) | RESPIRATORY_TRACT | Status: DC
  Administered 2022-06-07 – 2022-06-08 (×5): 3 mL via RESPIRATORY_TRACT
  Filled 2022-06-07 (×5): qty 3

## 2022-06-07 MED ORDER — PREDNISONE 20 MG PO TABS: 40.0000 mg | ORAL_TABLET | Freq: Every day | ORAL | Status: DC

## 2022-06-07 MED ORDER — FUROSEMIDE 10 MG/ML IJ SOLN: 60.0000 mg | Freq: Two times a day (BID) | INTRAMUSCULAR | Status: DC

## 2022-06-07 NOTE — Progress Notes (Signed)
Heart Failure Navigator Progress Note  Assessed for Heart & Vascular TOC clinic readiness.  Patient does not meet criteria due to Advanced Heart Failure patient of Dr. Aundra Dubin.Earnestine Leys, BSN, RN Heart Failure Transport planner Only

## 2022-06-07 NOTE — Hospital Course (Addendum)
On 4L [place] Has had worsening dyspnea on exertion over last several months Weight has been stable, fluid level doing well in his opinion too. Nose bleed was drips first then chunks. 5pm 10/26. Denies cocaine but endorses marijauna. Nose bleeds since teenager Has breathing machine at home bid. Sometimes they work sometimes not. Using them regularly lately. JVD 3-4 cm

## 2022-06-07 NOTE — ED Notes (Signed)
MD at bedside. 

## 2022-06-07 NOTE — Progress Notes (Signed)
Subjective: Patient endorses blurred vision and sweating.  He states when this happens he usually passes out at home.  Denies any recurrent epistaxis.  Denies chest pain, worsening of his shortness of breath, denies any leg pain.  Objective:    06/07/2022    6:47 PM 06/07/2022    5:30 PM 06/07/2022    5:00 PM  Vitals with BMI  Systolic 098 119 147  Diastolic 93 63 76  Pulse 80 75 79   SpO2: 98 % O2 Flow Rate (L/min): 4 L/min FiO2 (%): 50 %  Exam:  Heart: RRR, no murmurs, extremities warm and well perfused Lungs: Significant wheezing, rales, rhonci MSK: No swelling or erythema or warmth at lower extremities. Neuro: CN II-XII intact, finger to nose, heel to shin normal, EOMI Psych: normal mood and affect  A/P: Unsure etiology of his blurred vision and sweating.  Vitals stable.  EKG without any ischemic changes when compared to prior.  Reassured by exam.  No concerns for MI, PE, CVA, or blood loss. Will schedule nebs.  Reach back out to nursing staff, patient feeling much better now.

## 2022-06-07 NOTE — ED Notes (Signed)
BiPAP removed on pt placed on 5L [place]  Pt states he is feeling much better than at arrival

## 2022-06-07 NOTE — ED Notes (Signed)
Patient received the nebulizer treatment at 1633 pm

## 2022-06-07 NOTE — ED Notes (Signed)
Notified admitting MD about morning meds not able to be given on time d/t high acuity pts in ED. Per MD, okay to retime meds for now.

## 2022-06-07 NOTE — ED Notes (Signed)
Pt report eyesight was blurry, he was hot and sweaty. Pt blood sugar was 123, and vital was stable. Internal teaching medicine was made aware.

## 2022-06-07 NOTE — ED Triage Notes (Signed)
Pt coming from home complaining of shortness of breath. Hx of COPD and CHF, pt had been taking his home breathing treatments without relief   EMS gave multiple nebs and placed pt on CPAP   On arrival to ED pt states he is feeling better and is able to speak in short sentences.

## 2022-06-07 NOTE — Progress Notes (Signed)
HD#0 Subjective:   Summary: Mr. 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   Patient reports he has had worsening dyspnea on exertion over last several months. Weight has been stable, fluid level doing well in his opinion too. Reports nose bleed that was drips first then chunks.  Denies cocaine but endorses marijauna. Endorses nose bleeds since teenager Has breathing machine at home that he uses twice in the. Sometimes they work sometimes not. Using them more regularly lately.  Objective:  Vital signs in last 24 hours: Vitals:   06/07/22 1645 06/07/22 1700 06/07/22 1730 06/07/22 1847  BP: 129/78 134/76 103/63 (!) 126/93  Pulse: 89 79 75 80  Resp:   (!) 28 (!) 26  Temp:      TempSrc:      SpO2: 92% 97% 93% 98%  Weight:      Height:       Supplemental O2: Nasal Cannula SpO2: 98 % O2 Flow Rate (L/min): 4 L/min FiO2 (%): 50 %   Physical Exam:  Constitutional: well-appearing elderly male sitting in hospital bed, in no acute distress HENT: normocephalic atraumatic, mucous membranes moist Eyes: conjunctiva non-erythematous Neck: supple Cardiovascular: regular rate and rhythm, no m/r/g, JVD 3 to 4 cm Pulmonary/Chest: lungs with diffuse wheezing, rales, rhonchi Abdominal: soft, non-tender, non-distended MSK: normal bulk and tone Neurological: alert & oriented x 3 Skin: warm and dry Psych: Normal mood and affect  Filed Weights   06/07/22 1445  Weight: 89.9 kg    No intake or output data in the 24 hours ending 06/07/22 1916 Net IO Since Admission: No IO data has been entered for this period [06/07/22 1916]  Pertinent Labs:    Latest Ref Rng & Units 06/07/2022    1:42 AM 06/06/2022   10:47 PM 12/27/2021   10:17 AM  CBC  WBC 4.0 - 10.5 K/uL 6.6   5.5   Hemoglobin 13.0 - 17.0 g/dL 12.4  13.9  15.0    Hematocrit 39.0 - 52.0 % 39.0  41.0  46.6   Platelets 150 - 400 K/uL 149   163        Latest Ref Rng & Units 06/07/2022    1:42 AM 06/06/2022   10:47 PM 05/23/2022   10:41 AM  CMP  Glucose 70 - 99 mg/dL 122   88   BUN 8 - 23 mg/dL 18   26   Creatinine 0.61 - 1.24 mg/dL 1.49   1.63   Sodium 135 - 145 mmol/L 139  137  139   Potassium 3.5 - 5.1 mmol/L 3.8  4.6  4.2   Chloride 98 - 111 mmol/L 106   113   CO2 22 - 32 mmol/L 19   21   Calcium 8.9 - 10.3 mg/dL 8.2   8.9   Total Protein 6.5 - 8.1 g/dL 6.0     Total Bilirubin 0.3 - 1.2 mg/dL 1.1     Alkaline Phos 38 - 126 U/L 70     AST 15 - 41 U/L 31     ALT 0 - 44 U/L 30       Imaging: ECHOCARDIOGRAM COMPLETE  Result Date: 06/07/2022    ECHOCARDIOGRAM REPORT   Patient Name:   Edward Mullen Date of Exam: 06/07/2022 Medical Rec #:  [id number]  Height:       72.0 in Accession #:    [id number]        Weight:       198.2 lb Date of Birth:  May 25, 1956        BSA:          2.122 m Patient Age:    41 years          BP:           107/68 mmHg Patient Gender: M                 HR:           63 bpm. Exam Location:  Inpatient Procedure: 2D Echo and Intracardiac Opacification Agent Indications:    CHF  History:        Patient has prior history of Echocardiogram examinations, most                 recent 04/04/2022. COPD; Risk Factors:Hypertension and Diabetes.  Sonographer:    Harvie Junior Referring Phys: [id number] [facility]INCENT  Sonographer Comments: Suboptimal subcostal window. IMPRESSIONS  1. Left ventricular ejection fraction, by estimation, is 20 to 25%. The left ventricle has severely decreased function. The left ventricle has no regional wall motion abnormalities. The left ventricular internal cavity size was moderately dilated. There  is mild left ventricular hypertrophy. Left ventricular diastolic parameters are consistent with Grade II diastolic dysfunction (pseudonormalization).  2. Right ventricular systolic function is normal.  The right ventricular size is moderately enlarged. There is normal pulmonary artery systolic pressure. The estimated right ventricular systolic pressure is 65.7 mmHg.  3. The mitral valve is normal in structure. Moderate mitral valve regurgitation. No evidence of mitral stenosis.  4. The aortic valve is normal in structure. Aortic valve regurgitation is mild. No aortic stenosis is present.  5. The inferior vena cava is dilated in size with >50% respiratory variability, suggesting right atrial pressure of 8 mmHg. Comparison(s): No significant change from prior study. Prior images reviewed side by side. FINDINGS  Left Ventricle: Left ventricular ejection fraction, by estimation, is 20 to 25%. The left ventricle has severely decreased function. The left ventricle has no regional wall motion abnormalities. Definity contrast agent was given IV to delineate the left  ventricular endocardial borders. The left ventricular internal cavity size was moderately dilated. There is mild left ventricular hypertrophy. Left ventricular diastolic parameters are consistent with Grade II diastolic dysfunction (pseudonormalization). Right Ventricle: The right ventricular size is moderately enlarged. No increase in right ventricular wall thickness. Right ventricular systolic function is normal. There is normal pulmonary artery systolic pressure. The tricuspid regurgitant velocity is 2.19 m/s, and with an assumed right atrial pressure of 8 mmHg, the estimated right ventricular systolic pressure is 84.6 mmHg. Left Atrium: Left atrial size was normal in size. Right Atrium: Right atrial size was normal in size. Pericardium: There is no evidence of pericardial effusion. Mitral Valve: The mitral valve is normal in structure. Moderate mitral valve regurgitation, with eccentric laterally directed jet. No evidence of mitral valve stenosis. Tricuspid Valve: The tricuspid valve is normal in structure. Tricuspid valve regurgitation is not  demonstrated. No evidence of tricuspid stenosis. Aortic Valve: The aortic valve is normal in structure. Aortic valve regurgitation is mild. Aortic regurgitation PHT measures 631 msec. No aortic stenosis is present. Aortic valve mean gradient measures 9.0 mmHg. Aortic valve peak gradient measures 17.1 mmHg. Aortic valve area, by VTI measures 2.68 cm. Pulmonic Valve: The pulmonic valve  was normal in structure. Pulmonic valve regurgitation is not visualized. No evidence of pulmonic stenosis. Aorta: The aortic root is normal in size and structure. Venous: The inferior vena cava is dilated in size with greater than 50% respiratory variability, suggesting right atrial pressure of 8 mmHg. IAS/Shunts: No atrial level shunt detected by color flow Doppler.  LEFT VENTRICLE PLAX 2D LVIDd:         7.20 cm      Diastology LVIDs:         6.20 cm      LV e' medial:    3.37 cm/s LV PW:         0.90 cm      LV E/e' medial:  27.1 LV IVS:        0.80 cm      LV e' lateral:   11.00 cm/s LVOT diam:     2.40 cm      LV E/e' lateral: 8.3 LV SV:         88 LV SV Index:   41 LVOT Area:     4.52 cm  LV Volumes (MOD) LV vol d, MOD A2C: 332.5 ml LV vol d, MOD A4C: 356.5 ml LV vol s, MOD A2C: 249.5 ml LV vol s, MOD A4C: 235.5 ml LV SV MOD A2C:     83.0 ml LV SV MOD A4C:     356.5 ml LV SV MOD BP:      103.4 ml RIGHT VENTRICLE RV Basal diam:  4.80 cm RV Mid diam:    4.40 cm RV S prime:     15.30 cm/s TAPSE (M-mode): 1.9 cm LEFT ATRIUM             Index        RIGHT ATRIUM           Index LA diam:        4.00 cm 1.88 cm/m   RA Area:     18.10 cm LA Vol (A2C):   66.6 ml 31.38 ml/m  RA Volume:   52.80 ml  24.88 ml/m LA Vol (A4C):   52.2 ml 24.59 ml/m LA Biplane Vol: 63.2 ml 29.78 ml/m  AORTIC VALVE                     PULMONIC VALVE AV Area (Vmax):    2.54 cm      PV Vmax:       1.12 m/s AV Area (Vmean):   2.80 cm      PV Peak grad:  5.0 mmHg AV Area (VTI):     2.68 cm AV Vmax:           207.00 cm/s AV Vmean:          142.000 cm/s AV VTI:             0.328 m AV Peak Grad:      17.1 mmHg AV Mean Grad:      9.0 mmHg LVOT Vmax:         116.00 cm/s LVOT Vmean:        87.800 cm/s LVOT VTI:          0.194 m LVOT/AV VTI ratio: 0.59 AI PHT:            631 msec  AORTA Ao Root diam: 3.70 cm Ao Asc diam:  3.70 cm MITRAL VALVE  TRICUSPID VALVE MV Area (PHT): 3.31 cm       TR Peak grad:   19.2 mmHg MV Decel Time: 229 msec       TR Vmax:        219.00 cm/s MR Peak grad:    69.6 mmHg MR Mean grad:    46.0 mmHg    SHUNTS MR Vmax:         417.00 cm/s  Systemic VTI:  0.19 m MR Vmean:        317.0 cm/s   Systemic Diam: 2.40 cm MR PISA:         2.26 cm MR PISA Eff ROA: 12 mm MR PISA Radius:  0.60 cm MV E velocity: 91.20 cm/s MV A velocity: 62.10 cm/s MV E/A ratio:  1.47 Candee Furbish MD Electronically signed by Candee Furbish MD Signature Date/Time: 06/07/2022/1:18:02 PM    Final    DG Chest Port 1 View  Result Date: 06/06/2022 CLINICAL DATA:  Shortness of breath EXAM: PORTABLE CHEST 1 VIEW COMPARISON:  10/07/2021 FINDINGS: Cardiac shadow is enlarged but stable. The lungs are well aerated bilaterally. No focal infiltrate is seen. No acute bony abnormality is noted. IMPRESSION: No active disease. Electronically Signed   By: Inez Catalina M.D.   On: 06/06/2022 22:59    Assessment/Plan:   Principal Problem:   Acute exacerbation of CHF (congestive heart failure) (HCC) Active Problems:   CKD (chronic kidney disease) stage 3, GFR 30-59 ml/min (HCC)   Type 2 diabetes mellitus with stage 3b chronic kidney disease, without long-term current use of insulin (HCC)   COPD exacerbation ([place])   Patient Summary: 66 year old person living with chronic heart failure with reduced ejection fraction, COPD, CKD stage IIIb, diabetes, and cocaine use disorder admitted to our service for acute on chronic HFrEF.    Acute hypoxic respiratory failure Acute on chronic biventricular heart failure exacerbation NYHA IIIb, Stg D Hx of nonischemic biventricular heart  failure with last EF of 25-30% w/ moderate to severe MR on echo 03/2022.  Repeat echo today with EF 20 to 25%.  Left ventricle moderately dilated.  Grade 2 diastolic dysfunction.  RV normal.  Right atrial pressure 8 mmHg.  No significant change from prior study.  Etiology of his cardiomyopathy thought to be HTN vs viral vs secondary to his substance use per cardiology. Currently follows with advanced heart failure; GDMT of carvedilol, jardiance, and spironolactone. His ARB therapy recently held secondary to worsening renal function.  Has been able to wean off of BiPAP, now on 4 L. Lungs with rhonchi and crackles, lower extremities with 1+ edema. BNP elevated at 2,548. Likely component of CHF exacerbation.  Possibility of cocaine use as inciting event. EKG without evidence of ACS or acute changes. Troponin mildly elevated and flat.  No evidence of poor perfusion on exam. -Continue diuresis -pending repeat echocardiogram, UDS. -restart jardiance, Cr at baseline.  -plan to restart carvedilol and spironolactone tomorrow as BP and perfusion allows  -strict I/O -mag above 2.0 and K above 4.0.  -wean oxygen as able   Wheezing Hx of suspected COPD with extensive tobacco use. Prior to dyspnea was having some increased cough with phlegm for past few days. Symptoms improved with solumedrol, magnesium, and atrovent per EMS.  Continues with significant wheezing on exam. Has had no formal diagnosis of COPD nor has he had PFT's done. -duonebs q6h PRN wheezing -Prednisone 40 mg for 5-day course -follow up outpatient PFT's    Hx of VT/NSVT Subtherapeutic digoxin  level, will restart and if has rising creatinine with diuresis will need to hold.  -continue amiodarone and digoxin, repeat dig level tomorrow -replete electrolytes -follow up outpatient CRT-D placement   Polysubstance use Hx of cocaine, marijuana, and alcohol use. -UDS positive for cocaine   CKD 3a Metabolic acidosis Cr baseline 1.4-1.6. On  admission Cr of 1.6 with GFR of 51. Bicarb with slight decrease to 19.  -if worsening renal function with diuresis, will need to adjust digoxin and jardiance -daily bmp   Normocytic Anemia Thrombocytopenia Hgb of 12.4 on admission. No evidence of continued epistaxis.  -If persistent anemia, can check iron studies, retic, and smear.    Epistaxis Seems to have resolved, no further bleeding.   T2DM Last A1c of 5.6.  Glucose relatively well controlled today.  Will add sliding scale if necessary. -continue to monitor   Diet: Heart Healthy VTE: Enoxaparin IVF: None,None Code: Full   Prior to Admission Living Arrangement: Home, living family Anticipated Discharge Location: Home Barriers to Discharge: further improvement in shortness of breath   Dispo: Admit patient to Inpatient with expected length of stay greater than 2 midnights  Linward Natal MD Internal Medicine Resident PGY-1 Please contact the on call pager after 5 pm and on weekends at [phone].

## 2022-06-07 NOTE — H&P (Addendum)
Date: 06/07/2022               Patient Name:  Edward Mullen MRN: [id number]  DOB: 1956-07-26 Age / Sex: 66 y.o., male   PCP: [place] Service: Internal Medicine Teaching Service         Attending Physician: Dr. Evette Doffing, Mallie Mussel, *    First Contact: Linward Natal, MD  Pager: [phone]  Second Contact: Farrel Gordon, DO Pager: ED [phone]       After Hours (After 5p/  First Contact Pager: [phone]  weekends / holidays): Second Contact Pager: [phone]   SUBJECTIVE   Chief Complaint: Shortness of breath  History of Present Illness:  [name] is a 66 y/o person living with a history of chronic nonischemic biventricular systolic heart failure EF 15-20%,  HTN, T2DM, CKD3a,  and polysubstance use brought in by EMS for acute shortness of breath.  He was in his usual state of health until he received his flu and pneumonia shot few days ago.  Yesterday around 2 PM he became having some nasal congestion he thought was secondary to these vaccines.  He blew his nose and noticed bright red blood.  This stopped bleeding and he blew his nose few hours later and again had an episode of bright red blood.  He was able to control this at home and felt okay after.  Later in the evening he was watching TV's when his grandson when he stood up and was not able to catch his breath.  He tried using a nebulizer at home and this did not help so his grandson called 76.  Per EMS run sheet, patient was found diaphoretic and increased work of breathing difficulty speaking between breaths. Patient's grandson noted that patient attempted to use nebulizer but slumped over while attempting to use it. Placed on CPAP and given nebulizer by EMS with improvement in breathing. He received albuterol, atrovent, magnesium, and solu-medrol via EMS.   Since being at the hospital and wearing a BiPAP he has had significant improvement in his breathing.  He denies any recent fevers, chills, chest  pain, abdominal pain, nausea vomiting or diarrhea, decreased urination.  He denies worsening of his chronic dyspnea with exertion.  He has noticed that he is chronically fatigued.  He is able to lie flat.  He states he takes his medications daily and has not missed a dose recently.  He takes his torsemide every other day.  He is planned for a CRT-D device placement in the coming months.  Meds:  Torsemide 20 mg every other day Amiodarone 200 mg daily Aspirin 81 mg daily Pepto Bismol  Carvedilol 3.125 twice daily Digoxin 0.125 mg daily Jardiance 10 mg daily Spiriva 12.5 mg daily Crestor 5 mg daily  Past Medical History Chronic nonischemic systolic biventricular heart failure Hypertension Type 2 diabetes Tobacco use disorder Polysubstance abuse Hyperlipidemia CKD3a   Past Surgical History:  Procedure Laterality Date   CARDIAC CATHETERIZATION N/A 08/27/2016   Procedure: Right/Left Heart Cath and Coronary Angiography;  Surgeon: Larey Dresser, MD;  Location: [facility];  Service: Cardiovascular;  Laterality: N/A;   COLONOSCOPY     CYSTECTOMY Right    "back of my shoulder"   RIGHT/LEFT HEART CATH AND CORONARY ANGIOGRAPHY N/A 12/04/2020   Procedure: RIGHT/LEFT HEART CATH AND CORONARY ANGIOGRAPHY;  Surgeon: Larey Dresser, MD;  Location: [facility];  Service: Cardiovascular;  Laterality: N/A;  RIGHT/LEFT HEART CATH AND CORONARY ANGIOGRAPHY N/A 10/10/2021   Procedure: RIGHT/LEFT HEART CATH AND CORONARY ANGIOGRAPHY;  Surgeon: Larey Dresser, MD;  Location: [facility];  Service: Cardiovascular;  Laterality: N/A;   TONSILLECTOMY     Allergies:  None  Social:  Lives With:Daughter and grandkids Occupation: Buyer, retail, 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  Previously followed with interval messaging service however states he now follows with [facility]  over the past few months Substances: started smoking at 66 years old, 1. 28 pack year history.  Prior daily alcohol use but this was a while ago.  Currently only drinks occasionally.  Denies ever needing to be admitted to the hospital for alcohol withdrawal or detox.  1/5th every day for a while but has not had. Still uses marijuana, last time was two weeks ago.  Was cocaine, last used 2 months ago  Family History:  Mother passed away of MI in late 88C He is uncertain of his siblings or father's past medical history  Allergies: Allergies as of 06/06/2022   (No Known Allergies)   Review of Systems: A complete ROS was negative except as per HPI.   OBJECTIVE:   Physical Exam: Blood pressure 94/64, pulse 70, temperature 97.8 F (36.6 C), temperature source Oral, resp. rate (!) 21, SpO2 93 %.  Constitutional: Ill-appearing, no acute distress HENT: normocephalic atraumatic, mucous membranes moist.  Poor dentition Eyes: conjunctiva non-erythematous Neck: supple Cardiovascular: Distant heart sounds, regular rate and rhythm. Unable to assess JVD with beard.  Pulmonary/Chest: Mild respiratory distress on 3 L nasal cannula.  He has diffuse crackles with rhonchi and decreased breath sounds in the left lower lung. Abdominal: soft, non-tender, non-distended MSK: normal bulk and tone.  1+ lower extremity edema.  Extremities are warm to touch Neurological: alert & oriented x 3 Skin: warm and dry Psych: Normal mood and thought process.  Labs: CBC    Component Value Date/Time   WBC 6.6 06/07/2022 0142   RBC 4.39 06/07/2022 0142   HGB 12.4 (L) 06/07/2022 0142   HGB 15.0 12/27/2021 1017   HCT 39.0 06/07/2022 0142   HCT 46.6 12/27/2021 1017   PLT 149 (L) 06/07/2022 0142   PLT 163 12/27/2021 1017   MCV 88.8 06/07/2022 0142   MCV 84 12/27/2021 1017   MCH 28.2 06/07/2022 0142   MCHC 31.8 06/07/2022 0142   RDW 16.4 (H) 06/07/2022 0142   RDW 15.1 12/27/2021 1017   LYMPHSABS 0.4 (L) 06/07/2022  0142   LYMPHSABS 1.3 12/27/2021 1017   MONOABS 0.3 06/07/2022 0142   EOSABS 0.0 06/07/2022 0142   EOSABS 0.3 12/27/2021 1017   BASOSABS 0.0 06/07/2022 0142   BASOSABS 0.1 12/27/2021 1017     CMP     Component Value Date/Time   NA 139 06/07/2022 0142   NA 142 12/27/2021 1017   K 3.8 06/07/2022 0142   CL 106 06/07/2022 0142   CO2 19 (L) 06/07/2022 0142   GLUCOSE 122 (H) 06/07/2022 0142   BUN 18 06/07/2022 0142   BUN 24 12/27/2021 1017   CREATININE 1.49 (H) 06/07/2022 0142   CREATININE 1.21 06/19/2012 1346   CALCIUM 8.2 (L) 06/07/2022 0142   PROT 6.0 (L) 06/07/2022 0142   PROT 7.5 12/27/2021 1017   ALBUMIN 2.9 (L) 06/07/2022 0142   ALBUMIN 4.1 12/27/2021 1017   AST 31 06/07/2022 0142   ALT 30  06/07/2022 0142   ALKPHOS 70 06/07/2022 0142   BILITOT 1.1 06/07/2022 0142   BILITOT <0.2 12/27/2021 1017   GFRNONAA 51 (L) 06/07/2022 0142   GFRNONAA 66 06/19/2012 1346   GFRAA 59 (L) 08/01/2020 1046   GFRAA 76 06/19/2012 1346   Imaging:  Chest Xray - No acute pathology identified. No focal opacities or blunting of costophrenic angles  EKG: personally reviewed my interpretation is normal sinus rhythm frequent PVCs.  Left axis deviation.  Prolonged PR interval.  Left bundle branch block.  Prior EKG from 04/04/2022 similar except for presence of PVCs.  ASSESSMENT & PLAN:   Assessment & Plan by Problem: Principal Problem:   Acute exacerbation of CHF (congestive heart failure) (HCC) Active Problems:   CKD (chronic kidney disease) stage 3, GFR 30-59 ml/min (HCC)   Type 2 diabetes mellitus with stage 3b chronic kidney disease, without long-term current use of insulin (HCC)   COPD exacerbation ([place])  Mr. Turnage is a 66 y/o person living with a history of chronic nonischemic biventricular systolic heart failure EF 25-30%, HTN, T2DM, CKD3a and polysubstance use   Acute hypoxic respiratory failure Acute on chronic biventricular heart failure exacerbation NYHA IIIb, Stg D Hx of  nonischemic biventricular heart failure with last EF of 25-30% w/ moderate to severe MR on echo 03/2022. Etiology of his cardiomyopathy thought to be HTN vs viral vs secondary to his substance use per cardiology. Without a history of HIV and SPEP negative in the past. Currently follows with advanced heart failure; GDMT of carvedilol, jardiance, and spironolactone. His ARB therapy recently held secondary to worsening renal function.   Presented with acute dyspnea requiring bipap, now saturating well on 3L [place]. Lungs with rhonchi and crackles, lower extremities with 1+ edema. BNP elevated at 2,548. Likely component of CHF exacerbation. Unclear inciting event; denies substance use or missing medication doses. EKG without evidence of ACS or acute changes. Troponin mildly elevated and flat. No dietary or medications changes. No evidence of infectious etiology. Will continue to diurese, repeat echocardiogram, UDS. If has evidence of poor perfusion, check lactic acid to make certain does not need inotrope's. Question medication adherence with low digoxin level.  -received 20 lasix IV in ED, give additional 40 mg IV and continue with 60 mg IV BID.  -pending repeat echocardiogram, UDS. -restart jardiance, Cr at baseline.  -plan to restart carvedilol and spironolactone tomorrow as BP and perfusion allows  -strict I/O -mag above 2.0 and K above 4.0.  -wean oxygen as able  Wheezing Hx of suspected COPD with extensive tobacco use. Prior to dyspnea was having some increased cough with phlegm for past few days. Symptoms improved with solumedrol, magnesium, and atrovent per EMS. Wheezing on exam. will treat with duonebs and continue with prednisone tomorrow for total course of 5 days. Has had no formal diagnosis of COPD nor has he had PFT's done. -duonebs q6h PRN wheezing -received solumedrol per EMS, start prednisone 40 mg tomorrow for 5 total day steroid course -follow up outpatient PFT's   Hx of  VT/NSVT Subtherapeutic digoxin level, will restart and if has rising creatinine with diuresis will need to hold.  -continue amiodarone and digoxin, repeat dig level tomorrow -replete electrolytes as per above.  -follow up outpatient CRT-D placement  Polysubstance use Hx of cocaine, marijuana, and alcohol use. -pending rapid UDS  CKD 3a Metabolic acidosis Cr baseline 1.4-1.6. On admission Cr of 1.6 with GFR of 51. Bicarb with slight decrease to 19.  -if worsening renal function  with diuresis, will need to adjust digoxin and jardiance -daily bmp  Normocytic Anemia Thrombocytopenia Hgb of 12.4 on admission. No evidence of continued epistaxis. If persistent anemia, can check iron studies, retic, and smear.   Epistaxis Seems to have resolved, no further bleeding.  T2DM Last A1c of 5.6 -continue to monitor  Diet: Heart Healthy VTE: Enoxaparin IVF: None,None Code: Full  Prior to Admission Living Arrangement: Home, living family Anticipated Discharge Location: Home Barriers to Discharge: further improvement in shortness of breath  Dispo: Admit patient to Inpatient with expected length of stay greater than 2 midnights.  Signed: Riesa Pope, MD Internal Medicine Resident PGY-3  06/07/2022, 8:37 AM

## 2022-06-07 NOTE — Progress Notes (Signed)
  Echocardiogram 2D Echocardiogram has been performed.  Edward Mullen 06/07/2022, 9:14 AM

## 2022-06-07 NOTE — ED Notes (Signed)
Internal medicine came to patient bedside in the ED and order was given for him to get a nebulizer treatment.

## 2022-06-07 NOTE — ED Notes (Signed)
Pt had a 3 beat run of vtach and then went back in sinus rhythm. Internal medicine was made award.

## 2022-06-08 DIAGNOSIS — J441 Chronic obstructive pulmonary disease with (acute) exacerbation: Secondary | ICD-10-CM

## 2022-06-08 DIAGNOSIS — F17210 Nicotine dependence, cigarettes, uncomplicated: Secondary | ICD-10-CM

## 2022-06-08 DIAGNOSIS — I5023 Acute on chronic systolic (congestive) heart failure: Secondary | ICD-10-CM

## 2022-06-08 DIAGNOSIS — J9601 Acute respiratory failure with hypoxia: Secondary | ICD-10-CM | POA: Diagnosis not present

## 2022-06-08 LAB — BASIC METABOLIC PANEL
Anion gap: 10 (ref 5–15)
BUN: 23 mg/dL (ref 8–23)
CO2: 27 mmol/L (ref 22–32)
Calcium: 8.8 mg/dL — ABNORMAL LOW (ref 8.9–10.3)
Chloride: 104 mmol/L (ref 98–111)
Creatinine, Ser: 1.53 mg/dL — ABNORMAL HIGH (ref 0.61–1.24)
GFR, Estimated: 49 mL/min — ABNORMAL LOW (ref >60)
Glucose, Bld: 98 mg/dL (ref 70–99)
Potassium: 3.5 mmol/L (ref 3.5–5.1)
Sodium: 141 mmol/L (ref 135–145)

## 2022-06-08 LAB — CBC
HCT: 39.1 % (ref 39.0–52.0)
Hemoglobin: 12.8 g/dL — ABNORMAL LOW (ref 13.0–17.0)
MCH: 28.1 pg (ref 26.0–34.0)
MCHC: 32.7 g/dL (ref 30.0–36.0)
MCV: 85.9 fL (ref 80.0–100.0)
Platelets: 174 10*3/uL (ref 150–400)
RBC: 4.55 MIL/uL (ref 4.22–5.81)
RDW: 16.2 % — ABNORMAL HIGH (ref 11.5–15.5)
WBC: 12.9 10*3/uL — ABNORMAL HIGH (ref 4.0–10.5)
nRBC: 0 % (ref 0.0–0.2)

## 2022-06-08 LAB — GLUCOSE, CAPILLARY
Glucose-Capillary: 86 mg/dL (ref 70–99)
Glucose-Capillary: 106 mg/dL — ABNORMAL HIGH (ref 70–99)
Glucose-Capillary: 140 mg/dL — ABNORMAL HIGH (ref 70–99)
Glucose-Capillary: 116 mg/dL — ABNORMAL HIGH (ref 70–99)

## 2022-06-08 LAB — DIGOXIN LEVEL: Digoxin Level: 0.3 ng/mL — ABNORMAL LOW (ref 0.8–2.0)

## 2022-06-08 LAB — MAGNESIUM: Magnesium: 1.9 mg/dL (ref 1.7–2.4)

## 2022-06-08 LAB — HEMOGLOBIN A1C
Hgb A1c MFr Bld: 4.9 % (ref 4.8–5.6)
Mean Plasma Glucose: 93.93 mg/dL

## 2022-06-08 MED ORDER — IPRATROPIUM-ALBUTEROL 0.5-2.5 (3) MG/3ML IN SOLN
3.0000 mL | Freq: Three times a day (TID) | RESPIRATORY_TRACT | Status: DC
  Administered 2022-06-08: 3 mL via RESPIRATORY_TRACT
  Filled 2022-06-08 (×2): qty 3

## 2022-06-08 MED ORDER — CARVEDILOL 3.125 MG PO TABS
3.1250 mg | ORAL_TABLET | Freq: Two times a day (BID) | ORAL | Status: DC
  Administered 2022-06-08 – 2022-06-09 (×3): 3.125 mg via ORAL
  Filled 2022-06-08 (×3): qty 1

## 2022-06-08 MED ORDER — CEFDINIR 300 MG PO CAPS
300.0000 mg | ORAL_CAPSULE | Freq: Two times a day (BID) | ORAL | Status: DC
  Administered 2022-06-08 – 2022-06-09 (×2): 300 mg via ORAL
  Filled 2022-06-08 (×3): qty 1

## 2022-06-08 MED ORDER — AZITHROMYCIN 250 MG PO TABS: 250.0000 mg | ORAL_TABLET | Freq: Every day | ORAL | Status: DC

## 2022-06-08 MED ORDER — AZITHROMYCIN 250 MG PO TABS: 500.0000 mg | ORAL_TABLET | Freq: Every day | ORAL | Status: DC

## 2022-06-08 MED ORDER — SPIRONOLACTONE 25 MG PO TABS
25.0000 mg | ORAL_TABLET | Freq: Every day | ORAL | Status: DC
  Administered 2022-06-08 – 2022-06-09 (×2): 25 mg via ORAL
  Filled 2022-06-08 (×2): qty 1

## 2022-06-08 MED ORDER — POTASSIUM CHLORIDE CRYS ER 20 MEQ PO TBCR
20.0000 meq | EXTENDED_RELEASE_TABLET | Freq: Two times a day (BID) | ORAL | Status: AC
  Administered 2022-06-08 (×2): 20 meq via ORAL
  Filled 2022-06-08 (×2): qty 1

## 2022-06-08 MED ORDER — TORSEMIDE 20 MG PO TABS: 20.0000 mg | ORAL_TABLET | Freq: Every day | ORAL | Status: DC | PRN

## 2022-06-08 NOTE — Progress Notes (Addendum)
HD#1 Subjective:   Summary: Edward Mullen is a 66 y/o person living with a history of chronic nonischemic biventricular systolic heart failure EF 15-20%,  HTN, T2DM, CKD3a,  and polysubstance use brought in by EMS for acute shortness of breath.   Overnight events: NAEO.  Patient states he feels much better today.  His breathing is much better.  He still does have some sweats and vision changes off and on.  He denies chest pain.  He denies any pain in his legs.  Endorses urinating quite a bit throughout the night.  Objective:  Vital signs in last 24 hours: Vitals:   06/08/22 0130 06/08/22 0412 06/08/22 0734 06/08/22 0746  BP: 103/81 (!) 109/90  111/77  Pulse: 69 63    Resp: 19 19    Temp:  97.7 F (36.5 C)  97.7 F (36.5 C)  TempSrc:  Oral  Oral  SpO2: 98% 99% 100% 100%  Weight:  87.1 kg    Height:       Supplemental O2: Nasal Cannula SpO2: 100 % O2 Flow Rate (L/min): 3 L/min FiO2 (%): 50 %   Physical Exam:  Constitutional: well-appearing elderly male sitting in hospital bed, in no acute distress HENT: normocephalic atraumatic, mucous membranes moist Eyes: conjunctiva non-erythematous Neck: supple Cardiovascular: regular rate and rhythm, no m/r/g, JVD 3 cm Pulmonary/Chest: lungs with diffuse wheezing, rales, rhonchi mildly improved from yesterday, normal work of breathing on 3 L MSK: normal bulk and tone, no edema Neurological: alert & oriented x 3 Skin: warm and dry Psych: Normal mood and affect  Filed Weights   06/07/22 1445 06/08/22 0412  Weight: 89.9 kg 87.1 kg     Intake/Output Summary (Last 24 hours) at 06/08/2022 1328 Last data filed at 06/08/2022 1100 Gross per 24 hour  Intake 100 ml  Output 3700 ml  Net -3600 ml   Net IO Since Admission: -3,600 mL [06/08/22 1328]  Pertinent Labs:    Latest Ref Rng & Units 06/08/2022    4:55 AM 06/07/2022    1:42 AM 06/06/2022   10:47 PM  CBC  WBC 4.0 - 10.5 K/uL 12.9  6.6    Hemoglobin 13.0 - 17.0 g/dL  12.8  12.4  13.9   Hematocrit 39.0 - 52.0 % 39.1  39.0  41.0   Platelets 150 - 400 K/uL 174  149         Latest Ref Rng & Units 06/08/2022    4:55 AM 06/07/2022    1:42 AM 06/06/2022   10:47 PM  CMP  Glucose 70 - 99 mg/dL 98  122    BUN 8 - 23 mg/dL 23  18    Creatinine 0.61 - 1.24 mg/dL 1.53  1.49    Sodium 135 - 145 mmol/L 141  139  137   Potassium 3.5 - 5.1 mmol/L 3.5  3.8  4.6   Chloride 98 - 111 mmol/L 104  106    CO2 22 - 32 mmol/L 27  19    Calcium 8.9 - 10.3 mg/dL 8.8  8.2    Total Protein 6.5 - 8.1 g/dL  6.0    Total Bilirubin 0.3 - 1.2 mg/dL  1.1    Alkaline Phos 38 - 126 U/L  70    AST 15 - 41 U/L  31    ALT 0 - 44 U/L  30      Imaging: No results found.  Assessment/Plan:   Principal Problem:   Acute exacerbation of CHF (congestive  heart failure) (HCC) Active Problems:   CKD (chronic kidney disease) stage 3, GFR 30-59 ml/min (HCC)   Type 2 diabetes mellitus with stage 3b chronic kidney disease, without long-term current use of insulin (HCC)   COPD exacerbation ([place])   Patient Summary: 66 year old person living with chronic heart failure with reduced ejection fraction, COPD, CKD stage IIIb, diabetes, and cocaine use disorder admitted to our service for acute on chronic HFrEF.    Acute hypoxic respiratory failure Acute on chronic biventricular heart failure exacerbation NYHA IIIb, Stg D Hx of nonischemic biventricular heart failure with last EF of 25-30% w/ moderate to severe MR on echo 03/2022.  Repeat echo 10/27 with EF 20 to 25%.  Oxygenating well now on 3 L. Lungs with rhonchi and crackles, lower extremities without edema and he states his shortness of breath has resolved. 1.8 L urine out. Likely component of CHF exacerbation.  Possibility of cocaine use as inciting event.   No evidence of poor perfusion on exam.  -Will discontinue IV Lasix and restart home spironolactone and torsemide -Continue jardiance, Cr at baseline.  -Continue carvedilol -strict  I/O -mag above 2.0 and K above 4.0.  -wean oxygen as able   Wheezing Suspect COPD exacerbation  Hx of suspected COPD with extensive tobacco use. Prior to dyspnea was having some increased cough with phlegm for past few days. Symptoms improved with solumedrol, magnesium, and atrovent per EMS.  Continues with significant wheezing and rhonchi on exam today. Oxygenation and symptoms improved. Has had no formal diagnosis of COPD nor has he had PFT's done.  -Cefdinir 300 mg twice daily for 5 days -duonebs q6h PRN wheezing -Prednisone 40 mg for 5-day course -follow up outpatient PFT's    Hx of VT/NSVT Subtherapeutic digoxin level. Restarted at 0.125 mg. Cr stable. Dig level 0.3 on repeat today. Continues with occasional NSVT this admission though HDS. -continue amiodarone and digoxin 0.125 mg daily -replete electrolytes (giving 20 meq K po x 2 today) -follow up outpatient CRT-D placement   Polysubstance use Hx of cocaine, marijuana, and alcohol use. -UDS positive for cocaine   CKD 3a Metabolic acidosis Cr baseline 1.4-1.6. On admission Cr of 1.6 with GFR of 51. Bicarb with slight decrease to 19, now at 27. Cr 1.53 now.  -if worsening renal function with diuresis, will need to adjust digoxin and jardiance -daily bmp   Normocytic Anemia Thrombocytopenia Hgb of 12.8. No evidence of continued epistaxis.  -If persistent anemia, can check iron studies, retic, and smear.    Epistaxis Resolved.  Will advise against further cocaine use. -Continue to follow for return of symptoms   T2DM Last A1c of 5.6.  Repeat 4.9 today.   Fasting glucose 86 this morning. -continue to monitor   Diet: Heart Healthy VTE: Enoxaparin IVF: None,None Code: Full   Prior to Admission Living Arrangement: Home, living family Anticipated Discharge Location: Home Barriers to Discharge: further improvement in shortness of breath   Dispo: Admit patient to Inpatient with expected length of stay greater than 2  midnights  Linward Natal MD Internal Medicine Resident PGY-1 Please contact the on call pager after 5 pm and on weekends at [phone].

## 2022-06-08 NOTE — ED Notes (Signed)
ED TO INPATIENT HANDOFF REPORT  ED Nurse Name and Phone #: Caryl Pina RN [phone]  S Name/Age/Gender Edward Mullen 66 y.o. male Room/Bed: 030C/030C  Code Status   Code Status: Full Code  Home/SNF/Other Home Patient oriented to: self, place, time, and situation Is this baseline? Yes   Triage Complete: Triage complete  Chief Complaint Acute exacerbation of CHF (congestive heart failure) ([place]) [I50.9]  Triage Note Pt coming from home complaining of shortness of breath. Hx of COPD and CHF, pt had been taking his home breathing treatments without relief   EMS gave multiple nebs and placed pt on CPAP   On arrival to ED pt states he is feeling better and is able to speak in short sentences.     RT at bedside to place pt on BiPap machine    Allergies No Known Allergies  Level of Care/Admitting Diagnosis ED Disposition     ED Disposition  Admit   Condition  --   [place]: [place] [[id number]]  Level of Care: Progressive [102]  Admit to Progressive based on following criteria: CARDIOVASCULAR & THORACIC of moderate stability with acute coronary syndrome symptoms/low risk myocardial infarction/hypertensive urgency/arrhythmias/heart failure potentially compromising stability and stable post cardiovascular intervention patients.  Admit to Progressive based on following criteria: RESPIRATORY PROBLEMS hypoxemic/hypercapnic respiratory failure that is responsive to NIPPV (BiPAP) or High Flow Nasal Cannula (6-80 lpm). Frequent assessment/intervention, no > Q2 hrs < Q4 hrs, to maintain oxygenation and pulmonary hygiene.  May admit patient to [facility] or [facility] if equivalent level of care is available:: No  Covid Evaluation: Asymptomatic - no recent exposure (last 10 days) testing not required  Diagnosis: Acute exacerbation of CHF (congestive heart failure) [facility]) [[id number]]  Admitting Physician: Axel Filler [id number]  Attending  Physician: Axel Filler [[id number]]  Certification:: I certify this patient will need inpatient services for at least 2 midnights  Estimated Length of Stay: 3          B Medical/Surgery History Past Medical History:  Diagnosis Date   Arthritis    "feels like it in my legs" (09/20/2014)   Borderline type 2 diabetes mellitus    patient denies   CHF (congestive heart failure) ([place])    Dysrhythmia    Hypertension    NSVT (nonsustained ventricular tachycardia) ([place]) 09/06/2016   Past Surgical History:  Procedure Laterality Date   CARDIAC CATHETERIZATION N/A 08/27/2016   Procedure: Right/Left Heart Cath and Coronary Angiography;  Surgeon: Larey Dresser, MD;  Location: [facility];  Service: Cardiovascular;  Laterality: N/A;   COLONOSCOPY     CYSTECTOMY Right    "back of my shoulder"   RIGHT/LEFT HEART CATH AND CORONARY ANGIOGRAPHY N/A 12/04/2020   Procedure: RIGHT/LEFT HEART CATH AND CORONARY ANGIOGRAPHY;  Surgeon: Larey Dresser, MD;  Location: [facility];  Service: Cardiovascular;  Laterality: N/A;   RIGHT/LEFT HEART CATH AND CORONARY ANGIOGRAPHY N/A 10/10/2021   Procedure: RIGHT/LEFT HEART CATH AND CORONARY ANGIOGRAPHY;  Surgeon: Larey Dresser, MD;  Location: [facility];  Service: Cardiovascular;  Laterality: N/A;   TONSILLECTOMY       A IV Location/Drains/Wounds Patient Lines/Drains/Airways Status     Active Line/Drains/Airways     Name Placement date Placement time Site Days   Peripheral IV 06/06/22 18 G Left Antecubital 06/06/22  2240  Antecubital  2   Incision (Closed) 06/08/19 Back Other (Comment) 06/08/19  1358  -- 1096  Intake/Output Last 24 hours  Intake/Output Summary (Last 24 hours) at 06/08/2022 0252 Last data filed at 06/08/2022 0053 Gross per 24 hour  Intake --  Output 1900 ml  Net -1900 ml    Labs/Imaging Results for orders placed or performed during the hospital encounter of 06/06/22 (from the past 48  hour(s))  I-Stat venous blood gas, ED     Status: Abnormal   Collection Time: 06/06/22 10:47 PM  Result Value Ref Range   pH, Ven 7.435 (H) 7.25 - 7.43   pCO2, Ven 30.3 (L) 44 - 60 mmHg   pO2, Ven 63 (H) 32 - 45 mmHg   Bicarbonate 20.3 20.0 - 28.0 mmol/L   TCO2 21 (L) 22 - 32 mmol/L   O2 Saturation 93 %   Acid-base deficit 3.0 (H) 0.0 - 2.0 mmol/L   Sodium 137 135 - 145 mmol/L   Potassium 4.6 3.5 - 5.1 mmol/L   Calcium, Ion 1.06 (L) 1.15 - 1.40 mmol/L   HCT 41.0 39.0 - 52.0 %   Hemoglobin 13.9 13.0 - 17.0 g/dL   Sample type VENOUS   Digoxin level     Status: Abnormal   Collection Time: 06/07/22 12:22 AM  Result Value Ref Range   Digoxin Level 0.4 (L) 0.8 - 2.0 ng/mL    Comment: RESULT CONFIRMED BY MANUAL DILUTION Performed at [facility], [address], [place], [place] [address]   CBC with Differential/Platelet     Status: Abnormal   Collection Time: 06/07/22  1:42 AM  Result Value Ref Range   WBC 6.6 4.0 - 10.5 K/uL   RBC 4.39 4.22 - 5.81 MIL/uL   Hemoglobin 12.4 (L) 13.0 - 17.0 g/dL   HCT 39.0 39.0 - 52.0 %   MCV 88.8 80.0 - 100.0 fL   MCH 28.2 26.0 - 34.0 pg   MCHC 31.8 30.0 - 36.0 g/dL   RDW 16.4 (H) 11.5 - 15.5 %   Platelets 149 (L) 150 - 400 K/uL   nRBC 0.0 0.0 - 0.2 %   Neutrophils Relative % 89 %   Neutro Abs 5.9 1.7 - 7.7 K/uL   Lymphocytes Relative 6 %   Lymphs Abs 0.4 (L) 0.7 - 4.0 K/uL   Monocytes Relative 5 %   Monocytes Absolute 0.3 0.1 - 1.0 K/uL   Eosinophils Relative 0 %   Eosinophils Absolute 0.0 0.0 - 0.5 K/uL   Basophils Relative 0 %   Basophils Absolute 0.0 0.0 - 0.1 K/uL   Immature Granulocytes 0 %   Abs Immature Granulocytes 0.01 0.00 - 0.07 K/uL    Comment: Performed at [facility], [place] [address], [place], [place] [address]  Comprehensive metabolic panel     Status: Abnormal   Collection Time: 06/07/22  1:42 AM  Result Value Ref Range   Sodium 139 135 - 145 mmol/L   Potassium 3.8 3.5 - 5.1 mmol/L   Chloride 106 98 - 111  mmol/L   CO2 19 (L) 22 - 32 mmol/L   Glucose, Bld 122 (H) 70 - 99 mg/dL    Comment: Glucose reference range applies only to samples taken after fasting for at least 8 hours.   BUN 18 8 - 23 mg/dL   Creatinine, Ser 1.49 (H) 0.61 - 1.24 mg/dL   Calcium 8.2 (L) 8.9 - 10.3 mg/dL   Total Protein 6.0 (L) 6.5 - 8.1 g/dL   Albumin 2.9 (L) 3.5 - 5.0 g/dL   AST 31 15 - 41 U/L  ALT 30 0 - 44 U/L   Alkaline Phosphatase 70 38 - 126 U/L   Total Bilirubin 1.1 0.3 - 1.2 mg/dL   GFR, Estimated 51 (L) >60 mL/min    Comment: (NOTE) Calculated using the CKD-EPI Creatinine Equation (2021)    Anion gap 14 5 - 15    Comment: Performed at [facility] [address], [place], [place] [address]  Brain natriuretic peptide     Status: Abnormal   Collection Time: 06/07/22  1:42 AM  Result Value Ref Range   B Natriuretic Peptide 2,548.2 (H) 0.0 - 100.0 pg/mL    Comment: Performed at [facility] [address] [place], [place] [address]  Troponin I (High Sensitivity)     Status: Abnormal   Collection Time: 06/07/22  1:42 AM  Result Value Ref Range   Troponin I (High Sensitivity) 66 (H) <18 ng/L    Comment: (NOTE) Elevated high sensitivity troponin I (hsTnI) values and significant  changes across serial measurements may suggest ACS but many other  chronic and acute conditions are known to elevate hsTnI results.  Refer to the "Links" section for chest pain algorithms and additional  guidance. Performed at [facility], [place] [address], [place], [place] [address]   Troponin I (High Sensitivity)     Status: Abnormal   Collection Time: 06/07/22  4:18 AM  Result Value Ref Range   Troponin I (High Sensitivity) 50 (H) <18 ng/L    Comment: (NOTE) Elevated high sensitivity troponin I (hsTnI) values and significant  changes across serial measurements may suggest ACS but many other  chronic and acute conditions are known to elevate hsTnI results.  Refer to the "Links" section for chest pain  algorithms and additional  guidance. Performed at [facility], [place] [address], [place], [place] [address]   Rapid urine drug screen (hospital performed)     Status: Abnormal   Collection Time: 06/07/22  6:20 AM  Result Value Ref Range   Opiates NONE DETECTED NONE DETECTED   Cocaine POSITIVE (A) NONE DETECTED   Benzodiazepines NONE DETECTED NONE DETECTED   Amphetamines NONE DETECTED NONE DETECTED   Tetrahydrocannabinol POSITIVE (A) NONE DETECTED   Barbiturates NONE DETECTED NONE DETECTED    Comment: (NOTE) DRUG SCREEN FOR MEDICAL PURPOSES ONLY.  IF CONFIRMATION IS NEEDED FOR ANY PURPOSE, NOTIFY LAB WITHIN 5 DAYS.  LOWEST DETECTABLE LIMITS FOR URINE DRUG SCREEN Drug Class                     Cutoff (ng/mL) Amphetamine and metabolites    1000 Barbiturate and metabolites    200 Benzodiazepine                 200 Opiates and metabolites        300 Cocaine and metabolites        300 THC                            50 Performed at [facility], [place] [address], [place], [place] [address]   HIV Antibody (routine testing w rflx)     Status: None   Collection Time: 06/07/22  6:57 AM  Result Value Ref Range   HIV Screen 4th Generation wRfx Non Reactive Non Reactive    Comment: Performed at [facility], [place] [address], [place], [place] [address]  Magnesium     Status:  None   Collection Time: 06/07/22  6:57 AM  Result Value Ref Range   Magnesium 2.0 1.7 - 2.4 mg/dL    Comment: Performed at [facility], [place] [address] [place], [place] [address]  CBG monitoring, ED     Status: Abnormal   Collection Time: 06/07/22  1:54 PM  Result Value Ref Range   Glucose-Capillary 183 (H) 70 - 99 mg/dL    Comment: Glucose reference range applies only to samples taken after fasting for at least 8 hours.  CBG monitoring, ED     Status: Abnormal   Collection Time: 06/07/22  3:41 PM  Result Value Ref Range   Glucose-Capillary 123 (H) 70 - 99 mg/dL    Comment: Glucose  reference range applies only to samples taken after fasting for at least 8 hours.  CBG monitoring, ED     Status: Abnormal   Collection Time: 06/07/22  4:59 PM  Result Value Ref Range   Glucose-Capillary 112 (H) 70 - 99 mg/dL    Comment: Glucose reference range applies only to samples taken after fasting for at least 8 hours.  CBG monitoring, ED     Status: Abnormal   Collection Time: 06/07/22  9:57 PM  Result Value Ref Range   Glucose-Capillary 112 (H) 70 - 99 mg/dL    Comment: Glucose reference range applies only to samples taken after fasting for at least 8 hours.   ECHOCARDIOGRAM COMPLETE  Result Date: 06/07/2022    ECHOCARDIOGRAM REPORT   Patient Name:   Edward Mullen Date of Exam: 06/07/2022 Medical Rec #:  [id number]         Height:       72.0 in Accession #:    [id number]        Weight:       198.2 lb Date of Birth:  [birth year]        BSA:          2.122 m Patient Age:    48 years          BP:           107/68 mmHg Patient Gender: M                 HR:           63 bpm. Exam Location:  Inpatient Procedure: 2D Echo and Intracardiac Opacification Agent Indications:    CHF  History:        Patient has prior history of Echocardiogram examinations, most                 recent 04/04/2022. COPD; Risk Factors:Hypertension and Diabetes.  Sonographer:    Harvie Junior Referring Phys: [id number] [facility]  Sonographer Comments: Suboptimal subcostal window. IMPRESSIONS  1. Left ventricular ejection fraction, by estimation, is 20 to 25%. The left ventricle has severely decreased function. The left ventricle has no regional wall motion abnormalities. The left ventricular internal cavity size was moderately dilated. There  is mild left ventricular hypertrophy. Left ventricular diastolic parameters are consistent with Grade II diastolic dysfunction (pseudonormalization).  2. Right ventricular systolic function is normal. The right ventricular size is moderately enlarged. There is normal pulmonary  artery systolic pressure. The estimated right ventricular systolic pressure is 42.7 mmHg.  3. The mitral valve is normal in structure. Moderate mitral valve regurgitation. No evidence of mitral stenosis.  4. The aortic valve is normal in structure. Aortic valve regurgitation is mild. No aortic stenosis is present.  5. The inferior vena cava is dilated in size with >50% respiratory variability, suggesting right atrial pressure of 8 mmHg. Comparison(s): No significant change from prior study. Prior images reviewed side by side. FINDINGS  Left Ventricle: Left ventricular ejection fraction, by estimation, is 20 to 25%. The left ventricle has severely decreased function. The left ventricle has no regional wall motion abnormalities. Definity contrast agent was given IV to delineate the left  ventricular endocardial borders. The left ventricular internal cavity size was moderately dilated. There is mild left ventricular hypertrophy. Left ventricular diastolic parameters are consistent with Grade II diastolic dysfunction (pseudonormalization). Right Ventricle: The right ventricular size is moderately enlarged. No increase in right ventricular wall thickness. Right ventricular systolic function is normal. There is normal pulmonary artery systolic pressure. The tricuspid regurgitant velocity is 2.19 m/s, and with an assumed right atrial pressure of 8 mmHg, the estimated right ventricular systolic pressure is 86.7 mmHg. Left Atrium: Left atrial size was normal in size. Right Atrium: Right atrial size was normal in size. Pericardium: There is no evidence of pericardial effusion. Mitral Valve: The mitral valve is normal in structure. Moderate mitral valve regurgitation, with eccentric laterally directed jet. No evidence of mitral valve stenosis. Tricuspid Valve: The tricuspid valve is normal in structure. Tricuspid valve regurgitation is not demonstrated. No evidence of tricuspid stenosis. Aortic Valve: The aortic valve is  normal in structure. Aortic valve regurgitation is mild. Aortic regurgitation PHT measures 631 msec. No aortic stenosis is present. Aortic valve mean gradient measures 9.0 mmHg. Aortic valve peak gradient measures 17.1 mmHg. Aortic valve area, by VTI measures 2.68 cm. Pulmonic Valve: The pulmonic valve was normal in structure. Pulmonic valve regurgitation is not visualized. No evidence of pulmonic stenosis. Aorta: The aortic root is normal in size and structure. Venous: The inferior vena cava is dilated in size with greater than 50% respiratory variability, suggesting right atrial pressure of 8 mmHg. IAS/Shunts: No atrial level shunt detected by color flow Doppler.  LEFT VENTRICLE PLAX 2D LVIDd:         7.20 cm      Diastology LVIDs:         6.20 cm      LV e' medial:    3.37 cm/s LV PW:         0.90 cm      LV E/e' medial:  27.1 LV IVS:        0.80 cm      LV e' lateral:   11.00 cm/s LVOT diam:     2.40 cm      LV E/e' lateral: 8.3 LV SV:         88 LV SV Index:   41 LVOT Area:     4.52 cm  LV Volumes (MOD) LV vol d, MOD A2C: 332.5 ml LV vol d, MOD A4C: 356.5 ml LV vol s, MOD A2C: 249.5 ml LV vol s, MOD A4C: 235.5 ml LV SV MOD A2C:     83.0 ml LV SV MOD A4C:     356.5 ml LV SV MOD BP:      103.4 ml RIGHT VENTRICLE RV Basal diam:  4.80 cm RV Mid diam:    4.40 cm RV S prime:     15.30 cm/s TAPSE (M-mode): 1.9 cm LEFT ATRIUM             Index        RIGHT ATRIUM           Index LA  diam:        4.00 cm 1.88 cm/m   RA Area:     18.10 cm LA Vol (A2C):   66.6 ml 31.38 ml/m  RA Volume:   52.80 ml  24.88 ml/m LA Vol (A4C):   52.2 ml 24.59 ml/m LA Biplane Vol: 63.2 ml 29.78 ml/m  AORTIC VALVE                     PULMONIC VALVE AV Area (Vmax):    2.54 cm      PV Vmax:       1.12 m/s AV Area (Vmean):   2.80 cm      PV Peak grad:  5.0 mmHg AV Area (VTI):     2.68 cm AV Vmax:           207.00 cm/s AV Vmean:          142.000 cm/s AV VTI:            0.328 m AV Peak Grad:      17.1 mmHg AV Mean Grad:      9.0 mmHg LVOT  Vmax:         116.00 cm/s LVOT Vmean:        87.800 cm/s LVOT VTI:          0.194 m LVOT/AV VTI ratio: 0.59 AI PHT:            631 msec  AORTA Ao Root diam: 3.70 cm Ao Asc diam:  3.70 cm MITRAL VALVE                  TRICUSPID VALVE MV Area (PHT): 3.31 cm       TR Peak grad:   19.2 mmHg MV Decel Time: 229 msec       TR Vmax:        219.00 cm/s MR Peak grad:    69.6 mmHg MR Mean grad:    46.0 mmHg    SHUNTS MR Vmax:         417.00 cm/s  Systemic VTI:  0.19 m MR Vmean:        317.0 cm/s   Systemic Diam: 2.40 cm MR PISA:         2.26 cm MR PISA Eff ROA: 12 mm MR PISA Radius:  0.60 cm MV E velocity: 91.20 cm/s MV A velocity: 62.10 cm/s MV E/A ratio:  1.47 Candee Furbish MD Electronically signed by Candee Furbish MD Signature Date/Time: 06/07/2022/1:18:02 PM    Final    DG Chest Port 1 View  Result Date: 06/06/2022 CLINICAL DATA:  Shortness of breath EXAM: PORTABLE CHEST 1 VIEW COMPARISON:  10/07/2021 FINDINGS: Cardiac shadow is enlarged but stable. The lungs are well aerated bilaterally. No focal infiltrate is seen. No acute bony abnormality is noted. IMPRESSION: No active disease. Electronically Signed   By: Inez Catalina M.D.   On: 06/06/2022 22:59    Pending Labs Unresulted Labs (From admission, onward)     Start     Ordered   06/08/22 0102  Basic metabolic panel  Tomorrow morning,   R        06/07/22 0814   06/08/22 0500  Magnesium  Tomorrow morning,   R        06/07/22 0814   06/08/22 0500  CBC  Tomorrow morning,   R        06/07/22 0821   06/07/22 0839  Hemoglobin A1c  Once,   R  Comments: To assess prior glycemic control    06/07/22 0838            Vitals/Pain Today's Vitals   06/07/22 2245 06/07/22 2330 06/08/22 0000 06/08/22 0130  BP: 116/85 103/79 112/86 103/81  Pulse: 78 81 67 69  Resp: (!) 24 (!) '26 16 19  '$ Temp:      TempSrc:      SpO2: 97% 97% 94% 98%  Weight:      Height:      PainSc:        Isolation Precautions No active isolations  Medications Medications   enoxaparin (LOVENOX) injection 30 mg (30 mg Subcutaneous Given 06/07/22 0749)  amiodarone (PACERONE) tablet 200 mg (200 mg Oral Given 06/07/22 1450)  aspirin EC tablet 81 mg (81 mg Oral Given 06/07/22 1446)  digoxin (LANOXIN) tablet 0.125 mg (0.125 mg Oral Given 06/07/22 1449)  rosuvastatin (CRESTOR) tablet 5 mg (5 mg Oral Given 06/07/22 1447)  predniSONE (DELTASONE) tablet 40 mg (has no administration in time range)  insulin aspart (novoLOG) injection 0-6 Units ( Subcutaneous Not Given 06/07/22 1834)  insulin aspart (novoLOG) injection 0-5 Units ( Subcutaneous Not Given 06/07/22 2229)  perflutren lipid microspheres (DEFINITY) IV suspension (2 mLs Intravenous Given 06/07/22 0913)  ipratropium-albuterol (DUONEB) 0.5-2.5 (3) MG/3ML nebulizer solution 3 mL (3 mLs Nebulization Given 06/08/22 0141)  furosemide (LASIX) injection 60 mg (60 mg Intravenous Given 06/07/22 2234)  ipratropium (ATROVENT) nebulizer solution 1 mg (1 mg Nebulization Given 06/07/22 0024)  albuterol (PROVENTIL,VENTOLIN) solution continuous neb (15 mg Nebulization Given 06/07/22 0023)  furosemide (LASIX) injection 20 mg (20 mg Intravenous Given 06/07/22 0612)  furosemide (LASIX) injection 40 mg (40 mg Intravenous Given 06/07/22 1450)    Mobility walks with person assist Moderate fall risk   Focused Assessments Pulmonary Assessment Handoff:  Lung sounds: Bilateral Breath Sounds: Expiratory wheezes, Coarse crackles L Breath Sounds: Expiratory wheezes R Breath Sounds: Expiratory wheezes O2 Device: Nasal Cannula O2 Flow Rate (L/min): 3 L/min    R Recommendations: See Admitting Provider Note  Report given to:   Additional Notes:

## 2022-06-09 LAB — BASIC METABOLIC PANEL
Anion gap: 8 (ref 5–15)
BUN: 31 mg/dL — ABNORMAL HIGH (ref 8–23)
CO2: 30 mmol/L (ref 22–32)
Calcium: 8.8 mg/dL — ABNORMAL LOW (ref 8.9–10.3)
Chloride: 99 mmol/L (ref 98–111)
Creatinine, Ser: 1.64 mg/dL — ABNORMAL HIGH (ref 0.61–1.24)
GFR, Estimated: 45 mL/min — ABNORMAL LOW (ref >60)
Glucose, Bld: 127 mg/dL — ABNORMAL HIGH (ref 70–99)
Potassium: 4.7 mmol/L (ref 3.5–5.1)
Sodium: 137 mmol/L (ref 135–145)

## 2022-06-09 LAB — GLUCOSE, CAPILLARY
Glucose-Capillary: 99 mg/dL (ref 70–99)
Glucose-Capillary: 79 mg/dL (ref 70–99)

## 2022-06-09 LAB — CBC
HCT: 41.5 % (ref 39.0–52.0)
Hemoglobin: 13.5 g/dL (ref 13.0–17.0)
MCH: 28.7 pg (ref 26.0–34.0)
MCHC: 32.5 g/dL (ref 30.0–36.0)
MCV: 88.1 fL (ref 80.0–100.0)
Platelets: 192 10*3/uL (ref 150–400)
RBC: 4.71 MIL/uL (ref 4.22–5.81)
RDW: 16.2 % — ABNORMAL HIGH (ref 11.5–15.5)
WBC: 11.8 10*3/uL — ABNORMAL HIGH (ref 4.0–10.5)
nRBC: 0 % (ref 0.0–0.2)

## 2022-06-09 MED ORDER — CEFDINIR 300 MG PO CAPS: 300.0000 mg | ORAL_CAPSULE | Freq: Two times a day (BID) | ORAL | 0 refills | Status: DC

## 2022-06-09 MED ORDER — IPRATROPIUM-ALBUTEROL 0.5-2.5 (3) MG/3ML IN SOLN: 3.0000 mL | Freq: Four times a day (QID) | RESPIRATORY_TRACT | Status: DC | PRN

## 2022-06-09 MED ORDER — PREDNISONE 20 MG PO TABS: 40.0000 mg | ORAL_TABLET | Freq: Every day | ORAL | 0 refills | Status: DC

## 2022-06-09 NOTE — Discharge Summary (Signed)
Name: Edward Mullen MRN: [id number] DOB: 05-Jun-1956 66 y.o. PCP: Romana Juniper, MD  Date of Admission: 06/06/2022 10:39 PM Date of Discharge:  06/09/2022 Attending Physician: Dr. Philipp Ovens  DISCHARGE DIAGNOSIS:  Primary Problem: Acute exacerbation of CHF (congestive heart failure) [facility])   Hospital Problems: Principal Problem:   Acute exacerbation of CHF (congestive heart failure) (HCC) Active Problems:   CKD (chronic kidney disease) stage 3, GFR 30-59 ml/min (HCC)   Type 2 diabetes mellitus with stage 3b chronic kidney disease, without long-term current use of insulin (HCC)   COPD exacerbation (HCC)    DISCHARGE MEDICATIONS:   Allergies as of 06/09/2022   No Known Allergies      Medication List     STOP taking these medications    methylPREDNISolone 4 MG Tbpk tablet Commonly known as: MEDROL DOSEPAK       TAKE these medications    albuterol 108 (90 Base) MCG/ACT inhaler Commonly known as: VENTOLIN HFA INHALE 1 TO 2 PUFFS BY MOUTH EVERY 6 HOURS AS NEEDED FOR WHEEZING OR SHORTNESS OF BREATH   albuterol (2.5 MG/3ML) 0.083% nebulizer solution Commonly known as: PROVENTIL Take 3 mLs (2.5 mg total) by nebulization every 6 (six) hours as needed for shortness of breath or wheezing.   amiodarone 200 MG tablet Commonly known as: PACERONE Take 1 tablet (200 mg total) by mouth daily.   aspirin EC 81 MG tablet Take 1 tablet (81 mg total) by mouth daily. Swallow whole.   bismuth subsalicylate 737 TG/62IR suspension Commonly known as: PEPTO BISMOL Take 30 mLs by mouth every 6 (six) hours as needed for indigestion.   Breo Ellipta 100-25 MCG/ACT Aepb Generic drug: fluticasone furoate-vilanterol Inhale 1 puff into the lungs daily.   carvedilol 3.125 MG tablet Commonly known as: Coreg Take 1 tablet (3.125 mg total) by mouth 2 (two) times daily.   cefdinir 300 MG capsule Commonly known as: OMNICEF Take 1 capsule (300 mg total) by mouth every 12  (twelve) hours.   digoxin 0.125 MG tablet Commonly known as: LANOXIN TAKE 1 TABLET (0.125 MG TOTAL) BY MOUTH DAILY.   empagliflozin 10 MG Tabs tablet Commonly known as: Jardiance Take 1 tablet (10 mg total) by mouth daily.   predniSONE 20 MG tablet Commonly known as: DELTASONE Take 2 tablets (40 mg total) by mouth daily with breakfast. Start taking on: June 10, 2022   rosuvastatin 5 MG tablet Commonly known as: CRESTOR TAKE 1 TABLET (5 MG TOTAL) BY MOUTH DAILY.   spironolactone 25 MG tablet Commonly known as: ALDACTONE Take 1 tablet (25 mg total) by mouth daily.   tamsulosin 0.4 MG Caps capsule Commonly known as: FLOMAX TAKE ONE CAPSULE BY MOUTH ONCE DAILY AFTER SUPPER   torsemide 20 MG tablet Commonly known as: DEMADEX Take 1 tablet (20 mg total) by mouth daily as needed (extra fluid).   Vitamin D3 50 MCG (2000 UT) Tabs Take 1 tablet by mouth daily.        DISPOSITION AND FOLLOW-UP:  Edward Mullen was discharged from [facility] in Stable condition. At the hospital follow up visit please address:  Acute hypoxic respiratory failure 2/2 acute on chronic biventricular heart failure and COPD exacerbation NYHA IIIb, stage D Ensure acute exacerbations have resolved and that Edward Mullen completed his course of prednisone and cefdinir. Consider checking PFTs once acute exacerbation has resolved.   Hx of VT/NSVT Follow-up CRT-D placement plan. 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   Polysubstance use Continue counseling on abstinence from substances.   CKD stage 3a Metabolic acidosis, resolved Recheck BMP.   Normocytic Anemia Thrombocytopenia Recheck CBC.  Follow-up Recommendations: Consults: Cardiology Labs: Basic Metabolic Profile and Comprehensive Metabolic Panel Studies: PFTs Medications:   New at discharge: Cefdinir 300 mg twice daily for total of 10 days Prednisone 40 mg  daily for 3 additional days No other medication changes were made.  Follow-up Appointments:  Follow-up Information     Romana Juniper, MD. Schedule an appointment as soon as possible for a visit.   Why: In the next 1 week. Contact information: [address] [place] [address] [phone]                 HOSPITAL COURSE:  Patient Summary: Acute hypoxic respiratory failure 2/2 acute on chronic biventricular heart failure and COPD exacerbation NYHA IIIb, stage D Patient presented to [facility] ED with acute dyspnea requiring BIPAP initially with eventual weaning to [place]. On exam he had diffuse rhonchi and crackles in addition to 1+ pitting edema to bilateral lower extremities. He had JVD to about 4 cm above the sternal angle. BNP was elevated at 2,549. EKG was negative for ACS or acute changes and troponin were mildly elevated but flat. He received a dose of IV lasix in ED and on admission was continued on intensive diuresis. Repeat echocardiogram showed EF 20-25% with moderately dilated LV, normal RV, and RA pressure of 8 mmHg, not significantly changed from prior study. Home jardiance, spironolactone, and carvedilol were continued. He was started on prednisone for COPD exacerbation in addition to cefdinir due to prolonged OTc. He did have a brief episode of blurred vision with swelling that is not new for him, felt to possibly have been vasovagal response. HD1 he was transitioned from IV lasix to home spironolactone and torsemide. He received duonebs as well. He was weaned to room air prior to discharge. He is overall net -4.2L.   Hx of VT/NSVT On admission patient had a subtherapeutic digoxin level. This was continued during admission including home amiodarone. Electrolytes were repleted as indicated.   Polysubstance use Known history of cocaine, marijuana, and alcohol use with positive UDS for cocaine on admission.    CKD stage 3a Metabolic acidosis, resolved Renal function was  consistent with patient's baseline throughout admission. Initially he had a decreased bicarbonate of 19 which resolved prior to discharge.   Normocytic Anemia Thrombocytopenia Hgb was 12.4 on admission and remained stable.    DISCHARGE INSTRUCTIONS:   Discharge Instructions     (HEART FAILURE PATIENTS) Call MD:  Anytime you have any of the following symptoms: 1) 3 pound weight gain in 24 hours or 5 pounds in 1 week 2) shortness of breath, with or without a dry hacking cough 3) swelling in the hands, feet or stomach 4) if you have to sleep on extra pillows at night in order to breathe.   Complete by: As directed    Diet - low sodium heart healthy   Complete by: As directed    Discharge instructions   Complete by: As directed    Edward Mullen,  It was a pleasure to care for you during your stay at [facility] were treated for a COPD exacerbation, as well as an acute heart failure exacerbation.  When you go home, you will have two new medicines to take: 1. Cefdinir 300 mg: Take one tablet tonight, then take one tablet twice per  day until you run out. 2. Prednisone 2o mg: Take two tablets daily for 3 days starting tomorrow, October 30.  Please schedule an appointment with your primary care provider in the next 1-2 weeks.  My best, Dr. Marlou Sa   Increase activity slowly   Complete by: As directed        SUBJECTIVE:  Edward Mullen feels well this morning and has noticed improvements in his breathing and swelling. He says that he is ready to go home. Discharge Vitals:   BP 99/60 (BP Location: Right Arm)   Pulse 67   Temp 97.9 F (36.6 C) (Oral)   Resp 18   Ht 6' (1.829 m)   Wt 85.9 kg   SpO2 95%   BMI 25.69 kg/m   OBJECTIVE:  Constitutional:Resting comfortably, in no acute distress. Cardio:Regular rate and rhythm. No murmurs, rubs, or gallops. Pulm:Diffuse rhonchi, bibasilar most predominant. Normal work of breathing on room air. UMP:NTIRWERX for extremity  edema. Skin:Warm and dry. Neuro:Alert and oriented x3. No focal deficit noted. Psych:Pleasant mood and affect.    Pertinent Labs, Studies, and Procedures:     Latest Ref Rng & Units 06/09/2022   12:48 AM 06/08/2022    4:55 AM 06/07/2022    1:42 AM  CBC  WBC 4.0 - 10.5 K/uL 11.8  12.9  6.6   Hemoglobin 13.0 - 17.0 g/dL 13.5  12.8  12.4   Hematocrit 39.0 - 52.0 % 41.5  39.1  39.0   Platelets 150 - 400 K/uL 192  174  149        Latest Ref Rng & Units 06/09/2022   12:48 AM 06/08/2022    4:55 AM 06/07/2022    1:42 AM  CMP  Glucose 70 - 99 mg/dL 127  98  122   BUN 8 - 23 mg/dL '31  23  18   '$ Creatinine 0.61 - 1.24 mg/dL 1.64  1.53  1.49   Sodium 135 - 145 mmol/L 137  141  139   Potassium 3.5 - 5.1 mmol/L 4.7  3.5  3.8   Chloride 98 - 111 mmol/L 99  104  106   CO2 22 - 32 mmol/L '30  27  19   '$ Calcium 8.9 - 10.3 mg/dL 8.8  8.8  8.2   Total Protein 6.5 - 8.1 g/dL   6.0   Total Bilirubin 0.3 - 1.2 mg/dL   1.1   Alkaline Phos 38 - 126 U/L   70   AST 15 - 41 U/L   31   ALT 0 - 44 U/L   30     ECHOCARDIOGRAM COMPLETE  Result Date: 06/07/2022    ECHOCARDIOGRAM REPORT   Patient Name:   Edward Mullen Date of Exam: 06/07/2022 Medical Rec #:  [id number]         Height:       72.0 in Accession #:    [id number]        Weight:       198.2 lb Date of Birth:  Nov 03, 1955        BSA:          2.122 m Patient Age:    82 years          BP:           107/68 mmHg Patient Gender: M                 HR:           63 bpm.  Exam Location:  Inpatient Procedure: 2D Echo and Intracardiac Opacification Agent Indications:    CHF  History:        Patient has prior history of Echocardiogram examinations, most                 recent 04/04/2022. COPD; Risk Factors:Hypertension and Diabetes.  Sonographer:    Harvie Junior Referring Phys: [id number] [facility]  Sonographer Comments: Suboptimal subcostal window. IMPRESSIONS  1. Left ventricular ejection fraction, by estimation, is 20 to 25%. The left ventricle  has severely decreased function. The left ventricle has no regional wall motion abnormalities. The left ventricular internal cavity size was moderately dilated. There  is mild left ventricular hypertrophy. Left ventricular diastolic parameters are consistent with Grade II diastolic dysfunction (pseudonormalization).  2. Right ventricular systolic function is normal. The right ventricular size is moderately enlarged. There is normal pulmonary artery systolic pressure. The estimated right ventricular systolic pressure is 31.5 mmHg.  3. The mitral valve is normal in structure. Moderate mitral valve regurgitation. No evidence of mitral stenosis.  4. The aortic valve is normal in structure. Aortic valve regurgitation is mild. No aortic stenosis is present.  5. The inferior vena cava is dilated in size with >50% respiratory variability, suggesting right atrial pressure of 8 mmHg. Comparison(s): No significant change from prior study. Prior images reviewed side by side. FINDINGS  Left Ventricle: Left ventricular ejection fraction, by estimation, is 20 to 25%. The left ventricle has severely decreased function. The left ventricle has no regional wall motion abnormalities. Definity contrast agent was given IV to delineate the left  ventricular endocardial borders. The left ventricular internal cavity size was moderately dilated. There is mild left ventricular hypertrophy. Left ventricular diastolic parameters are consistent with Grade II diastolic dysfunction (pseudonormalization). Right Ventricle: The right ventricular size is moderately enlarged. No increase in right ventricular wall thickness. Right ventricular systolic function is normal. There is normal pulmonary artery systolic pressure. The tricuspid regurgitant velocity is 2.19 m/s, and with an assumed right atrial pressure of 8 mmHg, the estimated right ventricular systolic pressure is 40.0 mmHg. Left Atrium: Left atrial size was normal in size. Right Atrium: Right  atrial size was normal in size. Pericardium: There is no evidence of pericardial effusion. Mitral Valve: The mitral valve is normal in structure. Moderate mitral valve regurgitation, with eccentric laterally directed jet. No evidence of mitral valve stenosis. Tricuspid Valve: The tricuspid valve is normal in structure. Tricuspid valve regurgitation is not demonstrated. No evidence of tricuspid stenosis. Aortic Valve: The aortic valve is normal in structure. Aortic valve regurgitation is mild. Aortic regurgitation PHT measures 631 msec. No aortic stenosis is present. Aortic valve mean gradient measures 9.0 mmHg. Aortic valve peak gradient measures 17.1 mmHg. Aortic valve area, by VTI measures 2.68 cm. Pulmonic Valve: The pulmonic valve was normal in structure. Pulmonic valve regurgitation is not visualized. No evidence of pulmonic stenosis. Aorta: The aortic root is normal in size and structure. Venous: The inferior vena cava is dilated in size with greater than 50% respiratory variability, suggesting right atrial pressure of 8 mmHg. IAS/Shunts: No atrial level shunt detected by color flow Doppler.  LEFT VENTRICLE PLAX 2D LVIDd:         7.20 cm      Diastology LVIDs:         6.20 cm      LV e' medial:    3.37 cm/s LV PW:         0.90 cm  LV E/e' medial:  27.1 LV IVS:        0.80 cm      LV e' lateral:   11.00 cm/s LVOT diam:     2.40 cm      LV E/e' lateral: 8.3 LV SV:         88 LV SV Index:   41 LVOT Area:     4.52 cm  LV Volumes (MOD) LV vol d, MOD A2C: 332.5 ml LV vol d, MOD A4C: 356.5 ml LV vol s, MOD A2C: 249.5 ml LV vol s, MOD A4C: 235.5 ml LV SV MOD A2C:     83.0 ml LV SV MOD A4C:     356.5 ml LV SV MOD BP:      103.4 ml RIGHT VENTRICLE RV Basal diam:  4.80 cm RV Mid diam:    4.40 cm RV S prime:     15.30 cm/s TAPSE (M-mode): 1.9 cm LEFT ATRIUM             Index        RIGHT ATRIUM           Index LA diam:        4.00 cm 1.88 cm/m   RA Area:     18.10 cm LA Vol (A2C):   66.6 ml 31.38 ml/m  RA Volume:    52.80 ml  24.88 ml/m LA Vol (A4C):   52.2 ml 24.59 ml/m LA Biplane Vol: 63.2 ml 29.78 ml/m  AORTIC VALVE                     PULMONIC VALVE AV Area (Vmax):    2.54 cm      PV Vmax:       1.12 m/s AV Area (Vmean):   2.80 cm      PV Peak grad:  5.0 mmHg AV Area (VTI):     2.68 cm AV Vmax:           207.00 cm/s AV Vmean:          142.000 cm/s AV VTI:            0.328 m AV Peak Grad:      17.1 mmHg AV Mean Grad:      9.0 mmHg LVOT Vmax:         116.00 cm/s LVOT Vmean:        87.800 cm/s LVOT VTI:          0.194 m LVOT/AV VTI ratio: 0.59 AI PHT:            631 msec  AORTA Ao Root diam: 3.70 cm Ao Asc diam:  3.70 cm MITRAL VALVE                  TRICUSPID VALVE MV Area (PHT): 3.31 cm       TR Peak grad:   19.2 mmHg MV Decel Time: 229 msec       TR Vmax:        219.00 cm/s MR Peak grad:    69.6 mmHg MR Mean grad:    46.0 mmHg    SHUNTS MR Vmax:         417.00 cm/s  Systemic VTI:  0.19 m MR Vmean:        317.0 cm/s   Systemic Diam: 2.40 cm MR PISA:         2.26 cm MR PISA Eff ROA: 12 mm MR PISA Radius:  0.60  cm MV E velocity: 91.20 cm/s MV A velocity: 62.10 cm/s MV E/A ratio:  1.47 Candee Furbish MD Electronically signed by Candee Furbish MD Signature Date/Time: 06/07/2022/1:18:02 PM    Final    DG Chest Port 1 View  Result Date: 06/06/2022 CLINICAL DATA:  Shortness of breath EXAM: PORTABLE CHEST 1 VIEW COMPARISON:  10/07/2021 FINDINGS: Cardiac shadow is enlarged but stable. The lungs are well aerated bilaterally. No focal infiltrate is seen. No acute bony abnormality is noted. IMPRESSION: No active disease. Electronically Signed   By: Inez Catalina M.D.   On: 06/06/2022 22:59     Signed: Farrel Gordon, D.O.  Internal Medicine Resident, PGY-1 [facility]  Pager: [phone] 3:20 PM, 06/09/2022

## 2022-06-09 NOTE — Progress Notes (Signed)
Pt being d/c, VSS, IV removed, Education complete.   Alvis Lemmings, RN 06/09/2022 2:14 PM

## 2022-06-10 ENCOUNTER — Emergency Department (HOSPITAL_COMMUNITY)
Admission: EM | Admit: 2022-06-10 | Discharge: 2022-06-11 | Disposition: A | Discharge status: Home / Self Care | Payer: Medicare Other | Attending: Emergency Medicine | Admitting: Emergency Medicine

## 2022-06-10 ENCOUNTER — Encounter (HOSPITAL_COMMUNITY): Payer: Self-pay

## 2022-06-10 ENCOUNTER — Telehealth: Payer: Self-pay

## 2022-06-10 DIAGNOSIS — Z7984 Long term (current) use of oral hypoglycemic drugs: Secondary | ICD-10-CM | POA: Diagnosis not present

## 2022-06-10 DIAGNOSIS — N189 Chronic kidney disease, unspecified: Secondary | ICD-10-CM | POA: Diagnosis not present

## 2022-06-10 DIAGNOSIS — Z79899 Other long term (current) drug therapy: Secondary | ICD-10-CM | POA: Diagnosis not present

## 2022-06-10 DIAGNOSIS — I130 Hypertensive heart and chronic kidney disease with heart failure and stage 1 through stage 4 chronic kidney disease, or unspecified chronic kidney disease: Secondary | ICD-10-CM | POA: Diagnosis not present

## 2022-06-10 DIAGNOSIS — Z7982 Long term (current) use of aspirin: Secondary | ICD-10-CM | POA: Insufficient documentation

## 2022-06-10 DIAGNOSIS — J449 Chronic obstructive pulmonary disease, unspecified: Secondary | ICD-10-CM | POA: Insufficient documentation

## 2022-06-10 DIAGNOSIS — R7989 Other specified abnormal findings of blood chemistry: Secondary | ICD-10-CM

## 2022-06-10 DIAGNOSIS — I509 Heart failure, unspecified: Secondary | ICD-10-CM | POA: Insufficient documentation

## 2022-06-10 DIAGNOSIS — R319 Hematuria, unspecified: Secondary | ICD-10-CM | POA: Diagnosis not present

## 2022-06-10 DIAGNOSIS — E1122 Type 2 diabetes mellitus with diabetic chronic kidney disease: Secondary | ICD-10-CM | POA: Insufficient documentation

## 2022-06-10 DIAGNOSIS — R339 Retention of urine, unspecified: Secondary | ICD-10-CM | POA: Diagnosis present

## 2022-06-10 NOTE — Patient Outreach (Signed)
  Care Coordination TOC Note Transition Care Management Follow-up Telephone Call Date of discharge and from where: [facility] 06/07/22-06/09/22 How have you been since you were released from the hospital? "I am feeling  fine but I am having trouble urinating" Any questions or concerns? Yes-Patient having issues urinating and has appointment at [facility] this afternoon.  Plan to call patient after appointment.  Items Reviewed: Did the pt receive and understand the discharge instructions provided? Yes  Medications obtained and verified? Yes  Other? No  Any new allergies since your discharge? No  Dietary orders reviewed? No Do you have support at home?  Need to verify with patient  Home Care and Equipment/Supplies: Were home health services ordered? not applicable If so, what is the name of the agency? N/a  Has the agency set up a time to come to the patient's home? no Were any new equipment or medical supplies ordered?  No What is the name of the medical supply agency? N/A Were you able to get the supplies/equipment? not applicable Do you have any questions related to the use of the equipment or supplies? No  Functional Questionnaire: (I = Independent and D = Dependent) ADLs: Need to discuss with patient.  He was going to his PCP appointment this afternoon  Bathing/Dressing- To be determined  Meal Prep- To be determined  Eating- To be determined  Maintaining continence- To be determined  Transferring/Ambulation- To be determined  Managing Meds- I  Follow up appointments reviewed:  PCP Hospital f/u appt confirmed? Yes  Scheduled to see resident at [facility] on 06/10/22 @ 3:00. [facility] [facility] f/u appt confirmed? No   Are transportation arrangements needed? No  If their condition worsens, is the pt aware to call PCP or go to the Emergency Dept.? Yes Was the patient provided with contact information for the PCP's office or ED? Yes Was to pt encouraged to call back with questions or  concerns? Yes  SDOH assessments and interventions completed:   Yes  Care Coordination Interventions Activated:  Yes   Care Coordination Interventions:  Follow up call after MD appointment    Encounter Outcome:  Pt. Visit Completed

## 2022-06-10 NOTE — ED Provider Triage Note (Signed)
Emergency Medicine Provider Triage Evaluation Note  Edward Mullen , a 66 y.o. male  was evaluated in triage.  Pt complains of being unable to urinatex1 day. States he was just discharged from the hospital for CHF exacerbation and has been unable to urinate since being home. Reports suprapubic pain and some nausea.  Review of Systems  Positive: Urinary retention Negative: Fever, chills  Physical Exam  There were no vitals taken for this visit. Gen:   Awake, no distress   Resp:  Normal effort  MSK:   Moves extremities without difficulty    Medical Decision Making  Medically screening exam initiated at 11:59 PM.  Appropriate orders placed.  Edward Mullen was informed that the remainder of the evaluation will be completed by another provider, this initial triage assessment does not replace that evaluation, and the importance of remaining in the ED until their evaluation is complete.   Edward Mullen, [place] 06/11/22 0000

## 2022-06-10 NOTE — ED Triage Notes (Signed)
Pt comes via [facility] from home for urinary retention and dysuria.

## 2022-06-11 ENCOUNTER — Emergency Department (HOSPITAL_COMMUNITY): Payer: Medicare Other

## 2022-06-11 ENCOUNTER — Encounter (HOSPITAL_COMMUNITY): Payer: Self-pay | Admitting: Student

## 2022-06-11 ENCOUNTER — Telehealth (HOSPITAL_COMMUNITY): Payer: Self-pay

## 2022-06-11 DIAGNOSIS — R339 Retention of urine, unspecified: Secondary | ICD-10-CM | POA: Diagnosis not present

## 2022-06-11 LAB — COMPREHENSIVE METABOLIC PANEL
ALT: 40 U/L (ref 0–44)
AST: 32 U/L (ref 15–41)
Albumin: 3.6 g/dL (ref 3.5–5.0)
Alkaline Phosphatase: 83 U/L (ref 38–126)
Anion gap: 10 (ref 5–15)
BUN: 38 mg/dL — ABNORMAL HIGH (ref 8–23)
CO2: 28 mmol/L (ref 22–32)
Calcium: 9 mg/dL (ref 8.9–10.3)
Chloride: 100 mmol/L (ref 98–111)
Creatinine, Ser: 2.21 mg/dL — ABNORMAL HIGH (ref 0.61–1.24)
GFR, Estimated: 32 mL/min — ABNORMAL LOW (ref >60)
Glucose, Bld: 90 mg/dL (ref 70–99)
Potassium: 3.9 mmol/L (ref 3.5–5.1)
Sodium: 138 mmol/L (ref 135–145)
Total Bilirubin: 0.9 mg/dL (ref 0.3–1.2)
Total Protein: 7.3 g/dL (ref 6.5–8.1)

## 2022-06-11 LAB — CBC WITH DIFFERENTIAL/PLATELET
Abs Immature Granulocytes: 0.05 10*3/uL (ref 0.00–0.07)
Basophils Absolute: 0.1 10*3/uL (ref 0.0–0.1)
Basophils Relative: 1 %
Eosinophils Absolute: 0.4 10*3/uL (ref 0.0–0.5)
Eosinophils Relative: 4 %
HCT: 49.3 % (ref 39.0–52.0)
Hemoglobin: 15.2 g/dL (ref 13.0–17.0)
Immature Granulocytes: 1 %
Lymphocytes Relative: 24 %
Lymphs Abs: 2.5 10*3/uL (ref 0.7–4.0)
MCH: 27.6 pg (ref 26.0–34.0)
MCHC: 30.8 g/dL (ref 30.0–36.0)
MCV: 89.6 fL (ref 80.0–100.0)
Monocytes Absolute: 1.2 10*3/uL — ABNORMAL HIGH (ref 0.1–1.0)
Monocytes Relative: 11 %
Neutro Abs: 6.3 10*3/uL (ref 1.7–7.7)
Neutrophils Relative %: 59 %
Platelets: 216 10*3/uL (ref 150–400)
RBC: 5.5 MIL/uL (ref 4.22–5.81)
RDW: 15.9 % — ABNORMAL HIGH (ref 11.5–15.5)
WBC: 10.5 10*3/uL (ref 4.0–10.5)
nRBC: 0 % (ref 0.0–0.2)

## 2022-06-11 LAB — URINALYSIS, ROUTINE W REFLEX MICROSCOPIC
Bacteria, UA: NONE SEEN
Bilirubin Urine: NEGATIVE
Glucose, UA: 50 mg/dL — AB
Ketones, ur: NEGATIVE mg/dL
Leukocytes,Ua: NEGATIVE
Nitrite: NEGATIVE
Protein, ur: NEGATIVE mg/dL
RBC / HPF: >50 RBC/hpf — ABNORMAL HIGH (ref 0–5)
Specific Gravity, Urine: 1.008 (ref 1.005–1.030)
pH: 5 (ref 5.0–8.0)

## 2022-06-11 NOTE — Telephone Encounter (Signed)
Edward Mullen called and reported to me that he was seen in the ER last night following issues with urine incontinence and blood in his urine. ER placed foley catheter and they got 700cc of output. ER MD advised patient to keep same in and follow up with PCP and [facility].   I contacted [facility] and he will be seen in their office for void trial on Friday Nov. 17th at 0800.   I called [name] to advise him of same and explained if he noticed any signs of infection, pain to be further evaluated. I will go out to see him on Thursday in the home for follow up.   [name] grateful for assistance. Call complete.   [name], [place] 06/11/2022

## 2022-06-11 NOTE — ED Provider Notes (Signed)
Edward Mullen   CSN: [id number] Arrival date & time: 06/10/22  2343     History  Chief Complaint  Patient presents with   Urinary Retention    Edward Mullen is a 66 y.o. male with a history of CHF with last EF of 20 to 25%, hypertension, CKD, COPD, and type 2 diabetes mellitus who presents to the ED with complaints of urinary retention. Patient reports he last had normal urination yesterday, throughout the day today has only had dribbling, some blood present in it just PTA, having suprapubic pressure with urge to void. No back pain @ present, did have some intermittent right flank pain x 1 week. Denies fever, chills, nausea, vomiting, numbness, weakness, dyspnea, or chest pain. Recent admission for acute chf, discharged home yesterday, has been doing well without dyspnea or leg swelling recurrence.   HPI     Home Medications Prior to Admission medications   Medication Sig Start Date End Date Taking? Authorizing Provider  albuterol (PROVENTIL) (2.5 MG/3ML) 0.083% nebulizer solution Take 3 mLs (2.5 mg total) by nebulization every 6 (six) hours as needed for shortness of breath or wheezing. 10/19/21   Lajean Manes, MD  albuterol (VENTOLIN HFA) 108 (90 Base) MCG/ACT inhaler INHALE 1 TO 2 PUFFS BY MOUTH EVERY 6 HOURS AS NEEDED FOR WHEEZING OR SHORTNESS OF BREATH 07/30/21   Harvie Heck, MD  amiodarone (PACERONE) 200 MG tablet Take 1 tablet (200 mg total) by mouth daily. 10/11/21   Farrel Gordon, DO  aspirin EC 81 MG tablet Take 1 tablet (81 mg total) by mouth daily. Swallow whole. 01/29/22 01/30/23  Rafael Bihari, FNP  bismuth subsalicylate (PEPTO BISMOL) 262 MG/15ML suspension Take 30 mLs by mouth every 6 (six) hours as needed for indigestion.    [provider]  BREO ELLIPTA 100-25 MCG/ACT AEPB Inhale 1 puff into the lungs daily. 03/08/22   [provider]  carvedilol (COREG) 3.125 MG tablet Take 1 tablet (3.125 mg total)  by mouth 2 (two) times daily. 01/29/22 01/29/23  Rafael Bihari, FNP  cefdinir (OMNICEF) 300 MG capsule Take 1 capsule (300 mg total) by mouth every 12 (twelve) hours. 06/09/22   Farrel Gordon, DO  Cholecalciferol (VITAMIN D3) 50 MCG (2000 UT) TABS Take 1 tablet by mouth daily.    [provider]  digoxin (LANOXIN) 0.125 MG tablet TAKE 1 TABLET (0.125 MG TOTAL) BY MOUTH DAILY. 04/26/21   Larey Dresser, MD  empagliflozin (JARDIANCE) 10 MG TABS tablet Take 1 tablet (10 mg total) by mouth daily. 10/19/21   Lajean Manes, MD  predniSONE (DELTASONE) 20 MG tablet Take 2 tablets (40 mg total) by mouth daily with breakfast. 06/10/22   Farrel Gordon, DO  rosuvastatin (CRESTOR) 5 MG tablet TAKE 1 TABLET (5 MG TOTAL) BY MOUTH DAILY. 12/26/20   Larey Dresser, MD  spironolactone (ALDACTONE) 25 MG tablet Take 1 tablet (25 mg total) by mouth daily. 04/24/22   Larey Dresser, MD  tamsulosin (FLOMAX) 0.4 MG CAPS capsule TAKE ONE CAPSULE BY MOUTH ONCE DAILY AFTER SUPPER 02/20/21   Riesa Pope, MD  torsemide (DEMADEX) 20 MG tablet Take 1 tablet (20 mg total) by mouth daily as needed (extra fluid). 04/24/22   Larey Dresser, MD      Allergies    Patient has no known allergies.    Review of Systems   Review of Systems  Constitutional:  Negative for chills and fever.  Respiratory:  Negative for shortness  of breath.   Cardiovascular:  Negative for chest pain and leg swelling.  Gastrointestinal:  Negative for abdominal pain, diarrhea and vomiting.  Genitourinary:  Positive for difficulty urinating, flank pain and hematuria. Negative for scrotal swelling and testicular pain.  Neurological:  Negative for syncope.  All other systems reviewed and are negative.   Physical Exam Updated Vital Signs BP (!) 118/90   Pulse 81   Temp (!) 96 F (35.6 C) (Oral)   Resp 20   SpO2 95%  Physical Exam Vitals and nursing Mullen reviewed.  Constitutional:      General: He is not in acute distress.     Appearance: He is well-developed. He is not toxic-appearing.  HENT:     Head: Normocephalic and atraumatic.  Eyes:     General:        Right eye: No discharge.        Left eye: No discharge.     Conjunctiva/sclera: Conjunctivae normal.  Cardiovascular:     Rate and Rhythm: Normal rate and regular rhythm.  Pulmonary:     Effort: No respiratory distress.     Breath sounds: Normal breath sounds. No wheezing or rales.  Abdominal:     General: There is no distension.     Palpations: Abdomen is soft.     Tenderness: There is no abdominal tenderness. There is no right CVA tenderness, left CVA tenderness, guarding or rebound.  Genitourinary:    Comments: Foley catheter in place Musculoskeletal:     Cervical back: Neck supple.     Right lower leg: No edema.     Left lower leg: No edema.  Skin:    General: Skin is warm and dry.  Neurological:     Mental Status: He is alert.     Comments: Clear speech.   Psychiatric:        Behavior: Behavior normal.     ED Results / Procedures / Treatments   Labs (all labs ordered are listed, but only abnormal results are displayed) Labs Reviewed  URINALYSIS, ROUTINE W REFLEX MICROSCOPIC - Abnormal; Notable for the following components:      Result Value   Glucose, UA 50 (*)    Hgb urine dipstick LARGE (*)    RBC / HPF >50 (*)    All other components within normal limits  CBC WITH DIFFERENTIAL/PLATELET - Abnormal; Notable for the following components:   RDW 15.9 (*)    Monocytes Absolute 1.2 (*)    All other components within normal limits  COMPREHENSIVE METABOLIC PANEL - Abnormal; Notable for the following components:   BUN 38 (*)    Creatinine, Ser 2.21 (*)    GFR, Estimated 32 (*)    All other components within normal limits    EKG None  Radiology CT Renal Stone Study  Result Date: 06/11/2022 CLINICAL DATA:  Flank pain, kidney stone suspected. Urinary retention, dysuria, microhematuria. EXAM: CT ABDOMEN AND PELVIS WITHOUT  CONTRAST TECHNIQUE: Multidetector CT imaging of the abdomen and pelvis was performed following the standard protocol without IV contrast. RADIATION DOSE REDUCTION: This exam was performed according to the departmental dose-optimization program which includes automated exposure control, adjustment of the mA and/or kV according to patient size and/or use of iterative reconstruction technique. COMPARISON:  None Available. FINDINGS: Lower chest: Mild bibasilar bronchiectasis is present with bronchial wall thickening and airway impaction in keeping with airway inflammation. Mild peribronchial ground-glass infiltrate within the right lower lobe and, to a lesser extent, posterior basal left lower  lobe may reflect superimposed infectious or inflammatory infiltrate. Mild coronary artery calcification. Mild cardiomegaly with left ventricular dilation. Hepatobiliary: No focal liver abnormality is seen. No gallstones, gallbladder wall thickening, or biliary dilatation. Pancreas: Unremarkable Spleen: Unremarkable Adrenals/Urinary Tract: The right adrenal gland contains a 9 mm benign adrenal adenoma. No follow-up imaging is recommended for this lesion. Left adrenal gland is unremarkable. 2 mm nonobstructing calculus is seen within the lower pole the right kidney. The kidneys are otherwise unremarkable. Foley catheter balloon is seen within a decompressed bladder lumen. Circumferential bladder wall thickening is present suggesting changes of chronic bladder outlet obstruction. Stomach/Bowel: Moderate ascending and descending colonic diverticulosis without superimposed acute inflammatory change. Stomach, small bowel, and large bowel are otherwise unremarkable. Appendix normal. No free intraperitoneal gas or fluid. Vascular/Lymphatic: Aortic atherosclerosis. No enlarged abdominal or pelvic lymph nodes. Reproductive: Marked prostatic enlargement with indentation of the bladder base. Other: No abdominal wall hernia or abnormality. No  abdominopelvic ascites. Musculoskeletal: L3-S1 lumbosacral fusion with instrumentation and posterior decompression has been performed. No acute bone abnormality. IMPRESSION: 1. No acute intra-abdominal pathology identified. 2. Minimal right nonobstructing nephrolithiasis. No urolithiasis. No hydronephrosis. 3. Marked prostatic enlargement with indentation of the bladder base. Circumferential bladder wall thickening in keeping with changes of chronic bladder outlet obstruction. Foley decompression of the bladder noted. 4. Moderate colonic diverticulosis without superimposed acute inflammatory change. 5. Mild coronary artery calcification. Mild cardiomegaly with left ventricular dilation. 6. 9 mm benign right adrenal adenoma. 7. Mild bibasilar bronchiectasis with bronchial wall thickening and airway impaction in keeping with airway inflammation. Mild peribronchial ground-glass infiltrate within the right lower lobe and, to a lesser extent, posterior basal left lower lobe may reflect superimposed infectious or inflammatory infiltrate. Aortic Atherosclerosis (ICD10-I70.0). Electronically Signed   By: Fidela Salisbury M.D.   On: 06/11/2022 02:47    Procedures Procedures    Medications Ordered in ED Medications - No data to display  ED Course/ Medical Decision Making/ A&P                           Medical Decision Making Amount and/or Complexity of Data Reviewed Radiology: ordered.   Patient presents to the ED with complaints of difficulty urinating, this involves an extensive number of treatment options, and is a complaint that carries with it a high risk of complications and morbidity. Nontoxic, vitals w/ mildly low temp, rechecked per tech 98 degrees farenheit .   Foley catheter placed due to retention prior to my assessment - 700 cc of output in foley. Patient feeling much better.   Additional history obtained:  Chart/nursing notes reviewed.  External records viewed including: recent labs  Lab  Tests:  I viewed & interpreted labs including:  CBC: unremarkable CMP: Creatinine increased to 2.21 up from 1.642 days prior. Urinalysis: Hematuria, no findings to suggest infection.  Imaging Studies:  I ordered and viewed the following imaging, agree with radiologist impression:  CT renal stone:  1. No acute intra-abdominal pathology identified. 2. Minimal right nonobstructing nephrolithiasis. No urolithiasis. No hydronephrosis. 3. Marked prostatic enlargement with indentation of the bladder base. Circumferential bladder wall thickening in keeping with changes of chronic bladder outlet obstruction. Foley decompression of the bladder noted. 4. Moderate colonic diverticulosis without superimposed acute inflammatory change. 5. Mild coronary artery calcification. Mild cardiomegaly with left ventricular dilation. 6. 9 mm benign right adrenal adenoma. 7. Mild bibasilar bronchiectasis with bronchial wall thickening and airway impaction in keeping with airway inflammation. Mild peribronchial  ground-glass infiltrate within the right lower lobe and, to a lesser extent, posterior basal left lower lobe may reflect superimposed infectious or inflammatory infiltrate. Aortic Atherosclerosis  ED Course:  Urinary retention- sxs relieved with foley catheter placement- ~700 cc output, mild increase in creatinine, possibly due to retention however will need PCP recheck. No UTI, hematuria, some flank pain earlier therefore CT renal stone study checked- no urolithiasis or hydronephrosis. Chronic bladder outlet obstruction noted. Additional findings above noted, in terms of lung findings patient without fever, cough, or dyspnea, low suspicion for infectious process- will have him follow up closely with PCP regarding this. Overall seems reasonable for discharge w/ PCP & urology follow up. Return precautions discussed.   I discussed results, treatment plan, need for follow-up, and return precautions with the patient. Provided  opportunity for questions, patient confirmed understanding and is in agreement with plan.   Findings and plan of care discussed with supervising physician Dr. Florina Ou who is in agreement.   Portions of this Mullen were generated with [employer]. Dictation errors may occur despite best attempts at proofreading.  Final Clinical Impression(s) / ED Diagnoses Final diagnoses:  Urinary retention  Elevated serum creatinine  Hematuria, unspecified type    Rx / DC Orders ED Discharge Orders     None         Amaryllis Dyke, PA-C 06/11/22 0603    Molpus, Jenny Reichmann, MD 06/11/22 [phone]

## 2022-06-11 NOTE — Discharge Instructions (Signed)
You were seen in the emergency department today due to problems urinating.  We placed a Foley catheter with improvement of this problem.  The catheter will be in place until you have followed up with urology, please call [facility] today to schedule a close follow-up appointment for this week to discuss your catheter, urinary retention problem, as well as the blood noted in your urine..  Your labs show that your creatinine which looks at your kidney function is somewhat elevated compared to prior labs, this will need to be rechecked.  Your CT scan did not show any kidney stones in the ureters, however it did show prostate enlargement, diverticulosis, as well as some plaque in the arteries of your heart and findings consistent with your heart failure.  You also had some changes in the lungs that could be inflammatory in nature-we would like you to discuss your CT findings with your primary care provider.  Please schedule follow-up with primary care soon as possible.  Return to the emergency department for new or worsening symptoms including but not limited to problems urinating, problems with your catheter, fever, cough, trouble breathing, chest pain, passing out, or any other concerns.

## 2022-06-13 ENCOUNTER — Other Ambulatory Visit (HOSPITAL_COMMUNITY): Payer: Self-pay

## 2022-06-13 ENCOUNTER — Institutional Professional Consult (permissible substitution): Payer: Self-pay | Admitting: Internal Medicine

## 2022-06-13 NOTE — Progress Notes (Signed)
Paramedicine Encounter    Patient ID: Edward Mullen, male    DOB: 10-Nov-1955, 66 y.o.   MRN: [id number]    Arrived for home visit for Edward Mullen. Edward Mullen ambulated to the door to greet me with no apparent shortness of breath while walking. He has a foley catheter in place in which he received in the ER at [facility] on 10/30. He reports he went in on 10/26 for shortness of breath and was found to have some fluid on him so he was treated able to urinate after diuretics and sent home. After this he says he was unable to urinate while at home so he went back to the hospital and was found to be retaining urine and a foley was placed and he was advised to follow up with [facility] and PCP.   On Tuesday I contacted [facility] for Edward Mullen and set up an appointment for 11/17 at 0800- he will be see by PCP on Monday 11/6.    Today his foley catheter has around 200cc present and is noted to have blood in urine as well as "strings" of clots. He says he has passed about 4-5 of those clots since being home with foley. He reports right flank pain which he has had sfor two weeks. He also reports he feels a mass around his testicles and believes his prostate is enlarged.  He will follow up with urology about same. I advised him if symptoms persist to reach out to [facility] to see if he could be seen sooner. Number provided.   He will complete Cefdinir tomorrow. He completed prednisone today. He has been compliant with his bubble packs. He denied need for torsemide lately, but was given diuretics in the ER visit on 10/26.   He reports he has been using his inhaler and nebulizer daily. I reminded him importance of same.   He denied recent use of cocaine- however drug screen at hospital was positive. He admits to use of mariajuana and alcohol.   He states that he has no current swelling or shortness of breath. No lower leg edema noted. Weight is stable at 192lbs today.   I reviewed bubble  packs- all are correct. Encouraged compliance.   I reviewed and wrote down all upcoming appointments. I educated him on keeping foley clean, free from tuggs or pulls and to ensure its emptied often. He agreed with same.   I plan to follow up in two weeks in the home but telephonic after his PCP visit on Monday.   Refills: Albuterol Inhaler Proventil    SOCIAL- patient advised he switched from [place] to [facility] plan. He has his card and I copied the information as follows:  Member ID- [id number] RX BIN [id number] PCN [id number] GROUP [id number]     Home visit complete.    Patient Care Team: Romana Juniper, MD as PCP - General Drema Pry as Social Worker  Patient Active Problem List   Diagnosis Date Noted   COPD exacerbation ([place]) 06/07/2022   Hyperlipemia 04/04/2022   First time seizure ([place]) 12/28/2021   Type 2 diabetes mellitus with stage 3b chronic kidney disease, without long-term current use of insulin ([place]) 10/05/2021   Hemoptysis    Acute exacerbation of CHF (congestive heart failure) ([place]) 10/04/2021   Urinary retention    Scrotal mass 08/01/2020   Nocturia 08/01/2020   Lumbar spinal stenosis 06/08/2019   Healthcare maintenance 05/11/2019   Housing problems 05/11/2019   Difficulty  urinating 04/07/2018   Colon cancer screening 04/07/2018   Anemia 12/19/2016   CKD (chronic kidney disease) stage 3, GFR 30-59 ml/min (H[facility]) 12/13/2016   Acute on chronic systolic heart failure ([place]) 09/06/2016   Non-ischemic cardiomyopathy ([place]) 08/27/2016   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/2016   Mitral regurgitation 08/27/2016   Polysubstance abuse ([place])    Tobacco abuse 09/19/2014   HTN (hypertension) 03/28/2011   Bilateral lower extremity pain 03/28/2011    Current Outpatient Medications:    albuterol (PROVENTIL) (2.5 MG/3ML) 0.083% nebulizer solution, Take 3 mLs (2.5 mg total) by nebulization every 6 (six) hours as needed for shortness  of breath or wheezing., Disp: 75 mL, Rfl: 2   albuterol (VENTOLIN HFA) 108 (90 Base) MCG/ACT inhaler, INHALE 1 TO 2 PUFFS BY MOUTH EVERY 6 HOURS AS NEEDED FOR WHEEZING OR SHORTNESS OF BREATH, Disp: 18 g, Rfl: 2   amiodarone (PACERONE) 200 MG tablet, Take 1 tablet (200 mg total) by mouth daily., Disp: 30 tablet, Rfl: 3   aspirin EC 81 MG tablet, Take 1 tablet (81 mg total) by mouth daily. Swallow whole., Disp: 30 tablet, Rfl: 11   bismuth subsalicylate (PEPTO BISMOL) 262 MG/15ML suspension, Take 30 mLs by mouth every 6 (six) hours as needed for indigestion., Disp: , Rfl:    BREO ELLIPTA 100-25 MCG/ACT AEPB, Inhale 1 puff into the lungs daily., Disp: , Rfl:    carvedilol (COREG) 3.125 MG tablet, Take 1 tablet (3.125 mg total) by mouth 2 (two) times daily., Disp: 60 tablet, Rfl: 11   cefdinir (OMNICEF) 300 MG capsule, Take 1 capsule (300 mg total) by mouth every 12 (twelve) hours., Disp: 8 capsule, Rfl: 0   Cholecalciferol (VITAMIN D3) 50 MCG (2000 UT) TABS, Take 1 tablet by mouth daily., Disp: , Rfl:    digoxin (LANOXIN) 0.125 MG tablet, TAKE 1 TABLET (0.125 MG TOTAL) BY MOUTH DAILY., Disp: 90 tablet, Rfl: 1   empagliflozin (JARDIANCE) 10 MG TABS tablet, Take 1 tablet (10 mg total) by mouth daily., Disp: 30 tablet, Rfl: 6   predniSONE (DELTASONE) 20 MG tablet, Take 2 tablets (40 mg total) by mouth daily with breakfast., Disp: 3 tablet, Rfl: 0   rosuvastatin (CRESTOR) 5 MG tablet, TAKE 1 TABLET (5 MG TOTAL) BY MOUTH DAILY., Disp: 90 tablet, Rfl: 3   spironolactone (ALDACTONE) 25 MG tablet, Take 1 tablet (25 mg total) by mouth daily., Disp: 90 tablet, Rfl: 3   tamsulosin (FLOMAX) 0.4 MG CAPS capsule, TAKE ONE CAPSULE BY MOUTH ONCE DAILY AFTER SUPPER, Disp: 30 capsule, Rfl: 2   torsemide (DEMADEX) 20 MG tablet, Take 1 tablet (20 mg total) by mouth daily as needed (extra fluid)., Disp: 30 tablet, Rfl: 2 No Known Allergies   Social History   Socioeconomic History   Marital status: Married    Spouse  name: Not on file   Number of children: Not on file   Years of education: Not on file   Highest education level: Not on file  Occupational History   Occupation: Engineer, manufacturing systems    Comment: has not been able to work steadily for the last year or two  Tobacco Use   Smoking status: Every Day    Packs/day: 0.10    Years: 48.00    Total pack years: 4.80    Types: Cigarettes   Smokeless tobacco: Never   Tobacco comments:    cutting back 2  per day  Vaping Use   Vaping Use: Never used  Substance and Sexual Activity  Alcohol use: Yes    Alcohol/week: 1.0 standard drink of alcohol    Types: 1 Cans of beer per week    Comment: occasionally   Drug use: Yes    Types: Marijuana    Comment: daily 4 times last 2 weeks ago as of 05/31/2019   Sexual activity: Yes    Birth control/protection: None  Other Topics Concern   Not on file  Social History Narrative   Not on file   Social Determinants of Health   Financial Resource Strain: Low Risk  (10/11/2021)   Overall Financial Resource Strain (CARDIA)    Difficulty of Paying Living Expenses: Not very hard  Food Insecurity: Food Insecurity Present (06/08/2022)   Hunger Vital Sign    Worried About Running Out of Food in the Last Year: Sometimes true    Ran Out of Food in the Last Year: Sometimes true  Transportation Needs: Unmet Transportation Needs (06/08/2022)   PRAPARE - Hydrologist (Medical): Yes    Lack of Transportation (Non-Medical): No  Physical Activity: Not on file  Stress: Not on file  Social Connections: Not on file  Intimate Partner Violence: Not At Risk (06/08/2022)   Humiliation, Afraid, Rape, and Kick questionnaire    Fear of Current or Ex-Partner: No    Emotionally Abused: No    Physically Abused: No    Sexually Abused: No    Physical Exam      Future Appointments  Date Time Provider [place]  06/17/2022  8:45 AM Masters, Joellen [place], DO [facility]  06/28/2022  2:30 PM  Deboraha Sprang, MD [facility]     ACTION: Home visit completed

## 2022-06-17 ENCOUNTER — Encounter: Payer: Self-pay | Admitting: Internal Medicine

## 2022-06-17 ENCOUNTER — Ambulatory Visit (INDEPENDENT_AMBULATORY_CARE_PROVIDER_SITE_OTHER): Payer: Medicare Other

## 2022-06-17 ENCOUNTER — Ambulatory Visit (INDEPENDENT_AMBULATORY_CARE_PROVIDER_SITE_OTHER): Payer: Medicare Other | Admitting: Internal Medicine

## 2022-06-17 ENCOUNTER — Other Ambulatory Visit: Payer: Self-pay

## 2022-06-17 VITALS — BP 107/75 | HR 72 | Temp 97.8°F | Ht 72.0 in | Wt 196.5 lb

## 2022-06-17 VITALS — BP 107/75 | HR 72 | Temp 97.8°F | Resp 28 | Ht 72.0 in | Wt 196.5 lb

## 2022-06-17 DIAGNOSIS — R339 Retention of urine, unspecified: Secondary | ICD-10-CM | POA: Diagnosis not present

## 2022-06-17 DIAGNOSIS — J441 Chronic obstructive pulmonary disease with (acute) exacerbation: Secondary | ICD-10-CM

## 2022-06-17 DIAGNOSIS — Z0000 Encounter for general adult medical examination without abnormal findings: Secondary | ICD-10-CM | POA: Diagnosis not present

## 2022-06-17 DIAGNOSIS — N179 Acute kidney failure, unspecified: Secondary | ICD-10-CM | POA: Diagnosis not present

## 2022-06-17 DIAGNOSIS — Z720 Tobacco use: Secondary | ICD-10-CM

## 2022-06-17 DIAGNOSIS — I5023 Acute on chronic systolic (congestive) heart failure: Secondary | ICD-10-CM

## 2022-06-17 DIAGNOSIS — J449 Chronic obstructive pulmonary disease, unspecified: Secondary | ICD-10-CM | POA: Diagnosis not present

## 2022-06-17 DIAGNOSIS — F17210 Nicotine dependence, cigarettes, uncomplicated: Secondary | ICD-10-CM

## 2022-06-17 MED ORDER — INCRUSE ELLIPTA 62.5 MCG/ACT IN AEPB: 1.0000 | INHALATION_SPRAY | Freq: Every day | RESPIRATORY_TRACT | 3 refills | Status: DC

## 2022-06-17 NOTE — Assessment & Plan Note (Signed)
Admitted 10/26-10/29 due to dyspnea at rest, orthopnea, hypervolemia on exam. He was treated with IV lasix. Repeat echo showed EF 20-25%. He follows with Dr. 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 Weight at discharge was 85kg and is 88kg today. He reports symptoms consistent with NYH class 2 which is improvement from admission. Medications include carvedilol 3.125 mg BID, empagliflozin 10 mg, spironolactone 25 mg, and torsemide 20 mg prn. He has been taking torsemide 1-2 times daily and empyting foley catherter 3 times (3L).  P: Continue GDMT medications. He is not wearing life vest at office visit. We talked about need for this and he said it is to difficult to wear with foley catheter in place. He has follow-up with Dr. Caryl Comes with EP 11/17 to discuss CRT-D.

## 2022-06-17 NOTE — Patient Instructions (Signed)
Thank you, Mr.Dylon L Remmel for allowing [place] to provide your care today.   Heart failure Please continue taking your medication as we talked about. If you develop worsening swellling, shortness of breath please call the clinic.  COPD I am adding a 2nd inhaler called Incruse. Continue using Breo as well. I would like to get formal studies of your lungs called pulmonary function tests.  Kidney I will call with results of lab work.  Urinary retention Follow-up with urology 11/17. If you develop burning when you pee or abdominal pressure please call clinic or go to urgent care to be seen.  You should wear your life vest, you are at high risk of sudden death and this can save you.  I have ordered the following labs for you:   Lab Orders         BMP8+Anion Gap       I have ordered the following medication/changed the following medications:   Stop the following medications: Medications Discontinued During This Encounter  Medication Reason   predniSONE (DELTASONE) 20 MG tablet Completed Course   cefdinir (OMNICEF) 300 MG capsule Change in therapy     Start the following medications: No orders of the defined types were placed in this encounter.    Follow up:  4 weeks   We look forward to seeing you next time. Please call our clinic at [phone] if you have any questions or concerns. The best time to call is Monday-Friday from 9am-4pm, but there is someone available 24/7. If after hours or the weekend, call the main hospital number and ask for the Internal Medicine Resident On-Call. If you need medication refills, please notify your pharmacy one week in advance and they will send [place] a request.   Thank you for trusting me with your care. Wishing you the best!   Christiana Fuchs, [place]

## 2022-06-17 NOTE — Assessment & Plan Note (Addendum)
Presented to ED 10/30 due to difficulty peeing. CT renal showed marked prostatic enlargement with indentation of bladder base and bladder wall thickening consistent with chronic bladder outlet obstruction. He was discharged with foley in place. P: Follow-up with urology 11/17.  Continue tamsulosin 0.4 mg qd  Catheter has been in place for 7 days. On exam no signs concerning for infection. Catheter was not exchanged in clinic as likely would be difficult to exchange and no signs of infection. I talked with patient about continuing with cleaning and foley care and gave return precautions to clinic or ED.

## 2022-06-17 NOTE — Progress Notes (Unsigned)
Subjective:  CC: hospital follow-up  HPI:  [name] is a 66 y.o. male with a past medical history stated below and presents today for hospital follow-up after COPD exacerbation and heart failure exacerbation.  He was admitted from 10/26-10/29. Following discharge he had difficulty urinating and went back to ED on 10/30. There he was found to have AKI with urinary retention. He was discharged with urology follow-up and foley catheter in place. Please see problem based assessment and plan for additional details.  Past Medical History:  Diagnosis Date   Arthritis    "feels like it in my legs" (09/20/2014)   CHF (congestive heart failure) (HCC)    Dysrhythmia    Hypertension    NSVT (nonsustained ventricular tachycardia) ([place]) 09/06/2016   pre diabetes    patient denies    Current Outpatient Medications on File Prior to Visit  Medication Sig Dispense Refill   albuterol (PROVENTIL) (2.5 MG/3ML) 0.083% nebulizer solution Take 3 mLs (2.5 mg total) by nebulization every 6 (six) hours as needed for shortness of breath or wheezing. 75 mL 2   albuterol (VENTOLIN HFA) 108 (90 Base) MCG/ACT inhaler INHALE 1 TO 2 PUFFS BY MOUTH EVERY 6 HOURS AS NEEDED FOR WHEEZING OR SHORTNESS OF BREATH 18 g 2   amiodarone (PACERONE) 200 MG tablet Take 1 tablet (200 mg total) by mouth daily. 30 tablet 3   aspirin EC 81 MG tablet Take 1 tablet (81 mg total) by mouth daily. Swallow whole. 30 tablet 11   bismuth subsalicylate (PEPTO BISMOL) 262 MG/15ML suspension Take 30 mLs by mouth every 6 (six) hours as needed for indigestion. (Patient not taking: Reported on 06/13/2022)     BREO ELLIPTA 100-25 MCG/ACT AEPB Inhale 1 puff into the lungs daily. (Patient not taking: Reported on 06/13/2022)     carvedilol (COREG) 3.125 MG tablet Take 1 tablet (3.125 mg total) by mouth 2 (two) times daily. 60 tablet 11   Cholecalciferol (VITAMIN D3) 50 MCG (2000 UT) TABS Take 1 tablet by mouth daily.     digoxin (LANOXIN)  0.125 MG tablet TAKE 1 TABLET (0.125 MG TOTAL) BY MOUTH DAILY. 90 tablet 1   empagliflozin (JARDIANCE) 10 MG TABS tablet Take 1 tablet (10 mg total) by mouth daily. 30 tablet 6   rosuvastatin (CRESTOR) 5 MG tablet TAKE 1 TABLET (5 MG TOTAL) BY MOUTH DAILY. 90 tablet 3   spironolactone (ALDACTONE) 25 MG tablet Take 1 tablet (25 mg total) by mouth daily. 90 tablet 3   tamsulosin (FLOMAX) 0.4 MG CAPS capsule TAKE ONE CAPSULE BY MOUTH ONCE DAILY AFTER SUPPER 30 capsule 2   torsemide (DEMADEX) 20 MG tablet Take 1 tablet (20 mg total) by mouth daily as needed (extra fluid). 30 tablet 2   No current facility-administered medications on file prior to visit.    Family History  Problem Relation Age of Onset   Stroke Mother 45   Heart disease Father 40   Colon cancer Neg Hx    Colon polyps Neg Hx    Esophageal cancer Neg Hx    Stomach cancer Neg Hx    Rectal cancer Neg Hx     Social History   Socioeconomic History   Marital status: Married    Spouse name: Not on file   Number of children: Not on file   Years of education: Not on file   Highest education level: Not on file  Occupational History   Occupation: Engineer, manufacturing systems    Comment:  has not been able to work steadily for the last year or two  Tobacco Use   Smoking status: Every Day    Packs/day: 0.10    Years: 48.00    Total pack years: 4.80    Types: Cigarettes   Smokeless tobacco: Never   Tobacco comments:    cutting back 2  per day.  No cigs  Vaping Use   Vaping Use: Never used  Substance and Sexual Activity   Alcohol use: Yes    Alcohol/week: 1.0 standard drink of alcohol    Types: 1 Cans of beer per week    Comment: occasionally   Drug use: Yes    Types: Marijuana    Comment: daily 4 times last 2 weeks ago as of 05/31/2019   Sexual activity: Yes    Birth control/protection: None  Other Topics Concern   Not on file  Social History Narrative   Not on file   Social Determinants of Health   Financial Resource  Strain: Low Risk  (06/17/2022)   Overall Financial Resource Strain (CARDIA)    Difficulty of Paying Living Expenses: Not hard at all  Food Insecurity: No Food Insecurity (06/17/2022)   Hunger Vital Sign    Worried About Running Out of Food in the Last Year: Never true    Ran Out of Food in the Last Year: Never true  Recent Concern: Food Insecurity - Food Insecurity Present (06/08/2022)   Hunger Vital Sign    Worried About Running Out of Food in the Last Year: Sometimes true    Ran Out of Food in the Last Year: Sometimes true  Transportation Needs: No Transportation Needs (06/17/2022)   PRAPARE - Hydrologist (Medical): No    Lack of Transportation (Non-Medical): No  Recent Concern: Transportation Needs - Unmet Transportation Needs (06/08/2022)   PRAPARE - Hydrologist (Medical): Yes    Lack of Transportation (Non-Medical): No  Physical Activity: Inactive (06/17/2022)   Exercise Vital Sign    Days of Exercise per Week: 0 days    Minutes of Exercise per Session: 0 min  Stress: No Stress Concern Present (06/17/2022)   [place]    Feeling of Stress : Not at all  Social Connections: Moderately Isolated (06/17/2022)   Social Connection and Isolation Panel [NHANES]    Frequency of Communication with Friends and Family: More than three times a week    Frequency of Social Gatherings with Friends and Family: More than three times a week    Attends Religious Services: Never    Marine scientist or Organizations: No    Attends Archivist Meetings: Never    Marital Status: Married  Human resources officer Violence: Not At Risk (06/17/2022)   Humiliation, Afraid, Rape, and Kick questionnaire    Fear of Current or Ex-Partner: No    Emotionally Abused: No    Physically Abused: No    Sexually Abused: No    Review of Systems: ROS negative except for what is noted on  the assessment and plan.  Objective:   Vitals:   06/17/22 0909  BP: 107/75  Pulse: 72  Resp: (!) 28  Temp: 97.8 F (36.6 C)  TempSrc: Oral  SpO2: 100%  Weight: 196 lb 8 oz (89.1 kg)  Height: 6' (1.829 m)    Physical Exam: Constitutional: well-appearing  Cardiovascular: regular rate and rhythm, no m/r/g Pulmonary/Chest: normal work of  breathing on room air, lungs clear to auscultation bilaterally Abdominal: soft, non-tender, non-distended MSK: normal bulk and tone GU: foley catheter in place, no erythema or edema surrounding urethra Neurological: antalgic gait  Assessment & Plan:  Urinary retention Presented to ED 10/30 due to difficulty peeing. CT renal showed marked prostatic enlargement with indentation of bladder base and bladder wall thickening consistent with chronic bladder outlet obstruction. He was discharged with foley in place. P: Follow-up with urology 11/17.  Continue tamsulosin 0.4 mg qd  Catheter has been in place for 7 days. On exam no signs concerning for infection. Catheter was not exchanged in clinic as likely would be difficult to exchange and no signs of infection. I talked with patient about continuing with cleaning and foley care and gave return precautions to clinic or ED.  Tobacco abuse He has not smoked for the last month.  AKI (acute kidney injury) [facility]) Following hospital discharge 10/29, he experienced urinary retention and was found to have AKI at ED. Creatinine was elevated to 2.21 with baseline creatinine of 1.5-1.6 with GFR Stage 3a.  P: Repeat BMP, foley remains in place.  Acute exacerbation of CHF (congestive heart failure) ([place]) Admitted 10/26-10/29 due to dyspnea at rest, orthopnea, hypervolemia on exam. He was treated with IV lasix. Repeat echo showed EF 20-25%. He follows with Dr. 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 Weight at discharge was 85kg and is 88kg today. He  reports symptoms consistent with NYH class 2 which is improvement from admission. Medications include carvedilol 3.125 mg BID, empagliflozin 10 mg, spironolactone 25 mg, and torsemide 20 mg prn. He has been taking torsemide 1-2 times daily and empyting foley catherter 3 times (3L).  P: Continue GDMT medications. He is not wearing life vest at office visit. We talked about need for this and he said it is to difficult to wear with foley catheter in place. He has follow-up with Dr. Caryl Comes with EP 11/17 to discuss CRT-D.  COPD (chronic obstructive pulmonary disease) ([place]) Patient was admitted and treated for COPD exacerbation. Since getting home he continues to have shortness of breath with activity that is improved with albuterol. He stopped smoking about a month ago. He uses Breo (fluticasone and vilanterol) daily.  P: -continue Breo -add Incruse  -PFT    Patient discussed with Dr. Walden Field Kysean Sweet, D.O. [facility]  PGY-2 Pager: [phone]  Phone: [phone] Date 06/18/2022  Time 7:09 AM

## 2022-06-17 NOTE — Assessment & Plan Note (Signed)
He has not smoked for the last month.

## 2022-06-17 NOTE — Progress Notes (Unsigned)
Subjective:   Edward Mullen is a 66 y.o. male who presents for an Initial Medicare Annual Wellness Visit. I connected with  Edward Mullen on 06/17/22 by a  Face-To-Face  enabled telemedicine application and verified that I am speaking with the correct person using two identifiers.  Patient Location: Other:  Office/Clinic  Provider Location: Office/Clinic  I discussed the limitations of evaluation and management by telemedicine. The patient expressed understanding and agreed to proceed.  Review of Systems    Defer to PCP       Objective:    Today's Vitals   06/17/22 1332 06/17/22 1335  BP: 107/75   Pulse: 72   Temp: 97.8 F (36.6 C)   TempSrc: Oral   SpO2: 100%   Weight: 196 lb 8 oz (89.1 kg)   Height: 6' (1.829 m)   PainSc:  8    Body mass index is 26.65 kg/m.     06/17/2022    1:38 PM 06/17/2022    9:18 AM 06/10/2022   11:56 PM 06/08/2022    4:12 AM 12/27/2021    9:40 AM 11/07/2021    9:52 AM 10/26/2021    9:22 AM  Advanced Directives  Does Patient Have a Medical Advance Directive? No No No No No No No  Would patient like information on creating a medical advance directive? No - Patient declined No - Patient declined  No - Patient declined No - Patient declined No - Patient declined No - Patient declined    Current Medications (verified) Outpatient Encounter Medications as of 06/17/2022  Medication Sig   albuterol (PROVENTIL) (2.5 MG/3ML) 0.083% nebulizer solution Take 3 mLs (2.5 mg total) by nebulization every 6 (six) hours as needed for shortness of breath or wheezing.   albuterol (VENTOLIN HFA) 108 (90 Base) MCG/ACT inhaler INHALE 1 TO 2 PUFFS BY MOUTH EVERY 6 HOURS AS NEEDED FOR WHEEZING OR SHORTNESS OF BREATH   amiodarone (PACERONE) 200 MG tablet Take 1 tablet (200 mg total) by mouth daily.   aspirin EC 81 MG tablet Take 1 tablet (81 mg total) by mouth daily. Swallow whole.   bismuth subsalicylate (PEPTO BISMOL) 262 MG/15ML suspension Take 30 mLs by  mouth every 6 (six) hours as needed for indigestion. (Patient not taking: Reported on 06/13/2022)   BREO ELLIPTA 100-25 MCG/ACT AEPB Inhale 1 puff into the lungs daily. (Patient not taking: Reported on 06/13/2022)   carvedilol (COREG) 3.125 MG tablet Take 1 tablet (3.125 mg total) by mouth 2 (two) times daily.   Cholecalciferol (VITAMIN D3) 50 MCG (2000 UT) TABS Take 1 tablet by mouth daily.   digoxin (LANOXIN) 0.125 MG tablet TAKE 1 TABLET (0.125 MG TOTAL) BY MOUTH DAILY.   empagliflozin (JARDIANCE) 10 MG TABS tablet Take 1 tablet (10 mg total) by mouth daily.   rosuvastatin (CRESTOR) 5 MG tablet TAKE 1 TABLET (5 MG TOTAL) BY MOUTH DAILY.   spironolactone (ALDACTONE) 25 MG tablet Take 1 tablet (25 mg total) by mouth daily.   tamsulosin (FLOMAX) 0.4 MG CAPS capsule TAKE ONE CAPSULE BY MOUTH ONCE DAILY AFTER SUPPER   torsemide (DEMADEX) 20 MG tablet Take 1 tablet (20 mg total) by mouth daily as needed (extra fluid).   umeclidinium bromide (INCRUSE ELLIPTA) 62.5 MCG/ACT AEPB Inhale 1 puff into the lungs daily.   No facility-administered encounter medications on file as of 06/17/2022.    Allergies (verified) Patient has no known allergies.   History: Past Medical History:  Diagnosis Date   Arthritis    "  feels like it in my legs" (09/20/2014)   Borderline type 2 diabetes mellitus    patient denies   CHF (congestive heart failure) ([place])    Dysrhythmia    Hypertension    NSVT (nonsustained ventricular tachycardia) ([place]) 09/06/2016   Past Surgical History:  Procedure Laterality Date   CARDIAC CATHETERIZATION N/A 08/27/2016   Procedure: Right/Left Heart Cath and Coronary Angiography;  Surgeon: Larey Dresser, MD;  Location: [facility];  Service: Cardiovascular;  Laterality: N/A;   COLONOSCOPY     CYSTECTOMY Right    "back of my shoulder"   RIGHT/LEFT HEART CATH AND CORONARY ANGIOGRAPHY N/A 12/04/2020   Procedure: RIGHT/LEFT HEART CATH AND CORONARY ANGIOGRAPHY;  Surgeon: Larey Dresser, MD;  Location: [facility];  Service: Cardiovascular;  Laterality: N/A;   RIGHT/LEFT HEART CATH AND CORONARY ANGIOGRAPHY N/A 10/10/2021   Procedure: RIGHT/LEFT HEART CATH AND CORONARY ANGIOGRAPHY;  Surgeon: Larey Dresser, MD;  Location: [facility];  Service: Cardiovascular;  Laterality: N/A;   TONSILLECTOMY     Family History  Problem Relation Age of Onset   Stroke Mother 77   Heart disease Father 15   Colon cancer Neg Hx    Colon polyps Neg Hx    Esophageal cancer Neg Hx    Stomach cancer Neg Hx    Rectal cancer Neg Hx    Social History   Socioeconomic History   Marital status: Married    Spouse name: Not on file   Number of children: Not on file   Years of education: Not on file   Highest education level: Not on file  Occupational History   Occupation: Engineer, manufacturing systems    Comment: has not been able to work steadily for the last year or two  Tobacco Use   Smoking status: Every Day    Packs/day: 0.10    Years: 48.00    Total pack years: 4.80    Types: Cigarettes   Smokeless tobacco: Never   Tobacco comments:    cutting back 2  per day.  No cigs  Vaping Use   Vaping Use: Never used  Substance and Sexual Activity   Alcohol use: Yes    Alcohol/week: 1.0 standard drink of alcohol    Types: 1 Cans of beer per week    Comment: occasionally   Drug use: Yes    Types: Marijuana    Comment: daily 4 times last 2 weeks ago as of 05/31/2019   Sexual activity: Yes    Birth control/protection: None  Other Topics Concern   Not on file  Social History Narrative   Not on file   Social Determinants of Health   Financial Resource Strain: Low Risk  (06/17/2022)   Overall Financial Resource Strain (CARDIA)    Difficulty of Paying Living Expenses: Not hard at all  Food Insecurity: No Food Insecurity (06/17/2022)   Hunger Vital Sign    Worried About Running Out of Food in the Last Year: Never true    Ran Out of Food in the Last Year: Never true  Recent Concern:  Food Insecurity - Food Insecurity Present (06/08/2022)   Hunger Vital Sign    Worried About Running Out of Food in the Last Year: Sometimes true    Ran Out of Food in the Last Year: Sometimes true  Transportation Needs: No Transportation Needs (06/17/2022)   PRAPARE - Hydrologist (Medical): No    Lack of Transportation (Non-Medical):  No  Recent Concern: Transportation Needs - Unmet Transportation Needs (06/08/2022)   PRAPARE - Hydrologist (Medical): Yes    Lack of Transportation (Non-Medical): No  Physical Activity: Inactive (06/17/2022)   Exercise Vital Sign    Days of Exercise per Week: 0 days    Minutes of Exercise per Session: 0 min  Stress: No Stress Concern Present (06/17/2022)   [place]    Feeling of Stress : Not at all  Social Connections: Moderately Isolated (06/17/2022)   Social Connection and Isolation Panel [NHANES]    Frequency of Communication with Friends and Family: More than three times a week    Frequency of Social Gatherings with Friends and Family: More than three times a week    Attends Religious Services: Never    Marine scientist or Organizations: No    Attends Archivist Meetings: Never    Marital Status: Married    Tobacco Counseling Ready to quit: Not Answered Counseling given: Not Answered Tobacco comments: cutting back 2  per day.  No cigs   Clinical Intake:  Pre-visit preparation completed: Yes  Pain : 0-10 Pain Score: 8  Pain Type: Chronic pain Pain Location: Hip Pain Orientation: Right Pain Descriptors / Indicators: Aching Pain Onset: 1 to 4 weeks ago Pain Frequency: Constant Pain Relieving Factors: tylenol Effect of Pain on Daily Activities: walking  Pain Relieving Factors: tylenol  Nutritional Risks: None Diabetes: Yes  How often do you need to have someone help you when you read instructions,  pamphlets, or other written materials from your doctor or pharmacy?: 1 - Never What is the last grade level you completed in school?: 12th grade  Diabetic?Yes  Interpreter Needed?: No  Information entered by :: Corey Skains Samel Bruna,cma 06/17/22 1:36pm   Activities of Daily Living    06/17/2022    1:38 PM 06/17/2022    9:17 AM  In your present state of health, do you have any difficulty performing the following activities:  Hearing? 0 0  Vision? 1 1  Comment  blurry  Difficulty concentrating or making decisions? 0 0  Walking or climbing stairs? 1 1  Comment  Cather bag  Dressing or bathing? 0 0  Doing errands, shopping? 1 1  Comment  daughter    Patient Care Team: Romana Juniper, MD as PCP - General Drema Pry as Social Worker  Indicate any recent Medical Services you may have received from other than [facility] providers in the past year (date may be approximate).     Assessment:   This is a routine wellness examination for [place].  Hearing/Vision screen No results found.  Dietary issues and exercise activities discussed:     Goals Addressed   None   Depression Screen    06/17/2022    1:37 PM 06/17/2022    9:19 AM 12/27/2021    9:40 AM 11/07/2021    9:51 AM 10/26/2021    9:24 AM 12/15/2020   10:06 AM 08/03/2020   11:28 AM  PHQ 2/9 Scores  PHQ - 2 Score 0 0 0 0 0 0 0  PHQ- 9 Score 0 0  0 0 7 2    Fall Risk    06/17/2022    1:37 PM 06/17/2022    9:13 AM 12/27/2021    9:40 AM 11/07/2021    9:51 AM 10/26/2021    9:22 AM  Fall Risk   Falls in the past year?  0 0 0 0 0  Number falls in past yr: 0 0  0 0  Injury with Fall? 0 0  0 0  Risk for fall due to : No Fall Risks History of fall(s) Impaired balance/gait Impaired balance/gait No Fall Risks  Risk for fall due to: Comment  Urine bag     Follow up Falls evaluation completed;Falls prevention discussed Falls evaluation completed;Falls prevention discussed Falls evaluation completed Falls evaluation completed;Falls  prevention discussed Falls evaluation completed;Falls prevention discussed    FALL RISK PREVENTION PERTAINING TO THE HOME:  Any stairs in or around the home? No  If so, are there any without handrails? No  Home free of loose throw rugs in walkways, pet beds, electrical cords, etc? Yes  Adequate lighting in your home to reduce risk of falls? Yes   ASSISTIVE DEVICES UTILIZED TO PREVENT FALLS:  Life alert? No  Use of a cane, walker or w/c? Yes  cane,walker Grab bars in the bathroom? Yes  Shower chair or bench in shower? No  Elevated toilet seat or a handicapped toilet? No   TIMED UP AND GO:  Was the test performed? No .  Length of time to ambulate 10 feet: 0  Gait slow and steady with assistive device  Cognitive Function:        Immunizations Immunization History  Administered Date(s) Administered   Influenza Split 05/22/2011, 05/15/2012   Influenza,inj,Quad PF,6+ Mos 09/21/2014, 08/24/2016, 04/29/2017, 05/11/2019, 10/19/2021   PNEUMOCOCCAL CONJUGATE-20 10/19/2021   Pneumococcal Polysaccharide-23 09/21/2014   Tdap 05/22/2011, 10/19/2021    TDAP status: Up to date  Flu Vaccine status: Due, Education has been provided regarding the importance of this vaccine. Advised may receive this vaccine at local pharmacy or Health Dept. Aware to provide a copy of the vaccination record if obtained from local pharmacy or Health Dept. Verbalized acceptance and understanding.  Pneumococcal vaccine status: Up to date  Covid-19 vaccine status: Information provided on how to obtain vaccines.   Qualifies for Shingles Vaccine? No   Zostavax completed No   Shingrix Completed?: No.    Education has been provided regarding the importance of this vaccine. Patient has been advised to call insurance company to determine out of pocket expense if they have not yet received this vaccine. Advised may also receive vaccine at local pharmacy or Health Dept. Verbalized acceptance and  understanding.  Screening Tests Health Maintenance  Topic Date Due   COVID-19 Vaccine (1) Never done   FOOT EXAM  Never done   OPHTHALMOLOGY EXAM  Never done   Zoster Vaccines- Shingrix (1 of 2) Never done   Diabetic kidney evaluation - Urine ACR  05/21/2012   COLONOSCOPY (Pts 45-40yr Insurance coverage will need to be confirmed)  06/01/2021   INFLUENZA VACCINE  03/12/2022   HEMOGLOBIN A1C  12/08/2022   Diabetic kidney evaluation - GFR measurement  06/12/2023   Medicare Annual Wellness (AWV)  06/18/2023   TETANUS/TDAP  10/20/2031   Pneumonia Vaccine 66 Years old  Completed   Hepatitis C Screening  Completed   HPV VACCINES  Aged Out    Health Maintenance  Health Maintenance Due  Topic Date Due   COVID-19 Vaccine (1) Never done   FOOT EXAM  Never done   OPHTHALMOLOGY EXAM  Never done   Zoster Vaccines- Shingrix (1 of 2) Never done   Diabetic kidney evaluation - Urine ACR  05/21/2012   COLONOSCOPY (Pts 45-459yrInsurance coverage will need to be confirmed)  06/01/2021   INFLUENZA VACCINE  03/12/2022    Colorectal Cancer Screening: Patient Due  Lung Cancer Screening: (Low Dose CT Chest recommended if Age 17-80 years, 30 pack-year currently smoking OR have quit w/in 15years.) does not qualify.   Lung Cancer Screening Referral: N/A  Additional Screening:  Hepatitis C Screening: does not qualify; Completed 08/27/2016  Vision Screening: Recommended annual ophthalmology exams for early detection of glaucoma and other disorders of the eye. Is the patient up to date with their annual eye exam?  No  Who is the provider or what is the name of the office in which the patient attends annual eye exams?  If pt is not established with a provider, would they like to be referred to a provider to establish care? Yes .   Dental Screening: Recommended annual dental exams for proper oral hygiene  Community Resource Referral / Chronic Care Management: CRR required this visit?  No   CCM  required this visit?  No      Plan:     I have personally reviewed and noted the following in the patient's chart:   Medical and social history Use of alcohol, tobacco or illicit drugs  Current medications and supplements including opioid prescriptions. Patient is not currently taking opioid prescriptions. Functional ability and status Nutritional status Physical activity Advanced directives List of other physicians Hospitalizations, surgeries, and ER visits in previous 12 months Vitals Screenings to include cognitive, depression, and falls Referrals and appointments  In addition, I have reviewed and discussed with patient certain preventive protocols, quality metrics, and best practice recommendations. A written personalized care plan for preventive services as well as general preventive health recommendations were provided to patient.     Kerin Perna, [facility]   06/17/2022   Nurse Notes: Face-To-Face visit.

## 2022-06-17 NOTE — Assessment & Plan Note (Signed)
Following hospital discharge 10/29, he experienced urinary retention and was found to have AKI at ED. Creatinine was elevated to 2.21 with baseline creatinine of 1.5-1.6 with GFR Stage 3a.  P: Repeat BMP, foley remains in place.

## 2022-06-18 LAB — BMP8+ANION GAP
Anion Gap: 18 mmol/L (ref 10.0–18.0)
BUN/Creatinine Ratio: 12 (ref 10–24)
BUN: 19 mg/dL (ref 8–27)
CO2: 22 mmol/L (ref 20–29)
Calcium: 9.1 mg/dL (ref 8.6–10.2)
Chloride: 103 mmol/L (ref 96–106)
Creatinine, Ser: 1.55 mg/dL — ABNORMAL HIGH (ref 0.76–1.27)
Glucose: 83 mg/dL (ref 70–99)
Potassium: 4.3 mmol/L (ref 3.5–5.2)
Sodium: 143 mmol/L (ref 134–144)
eGFR: 48 mL/min/{1.73_m2} — ABNORMAL LOW (ref >59)

## 2022-06-18 NOTE — Assessment & Plan Note (Signed)
Patient was admitted and treated for COPD exacerbation. Since getting home he continues to have shortness of breath with activity that is improved with albuterol. He stopped smoking about a month ago. He uses Breo (fluticasone and vilanterol) daily.  P: -continue Breo -add Incruse  -PFT

## 2022-06-26 ENCOUNTER — Telehealth (HOSPITAL_COMMUNITY): Payer: Self-pay

## 2022-06-26 NOTE — Telephone Encounter (Signed)
Spoke to Edward Mullen about planning a home paramedicine visit. He reports he had a home health nurse today from his insurance who came out and assessed him and he reports his vitals were good and he is feeling good this week.   We discussed upcoming appointments. He states he goes Friday to have his foley catheter assessed and he also will be seen by EP. He states he would like a paramedicine visit next week. I will call to schedule same early next week. He agreed with plan. Call complete.   [name], [place] 06/26/2022

## 2022-06-28 ENCOUNTER — Ambulatory Visit: Payer: Medicare Other | Attending: Internal Medicine | Admitting: Internal Medicine

## 2022-06-28 ENCOUNTER — Encounter: Payer: Self-pay | Admitting: Internal Medicine

## 2022-06-28 VITALS — BP 106/70 | HR 79 | Ht 72.0 in | Wt 200.8 lb

## 2022-06-28 DIAGNOSIS — I4729 Other ventricular tachycardia: Secondary | ICD-10-CM

## 2022-06-28 DIAGNOSIS — I428 Other cardiomyopathies: Secondary | ICD-10-CM

## 2022-06-28 DIAGNOSIS — I5022 Chronic systolic (congestive) heart failure: Secondary | ICD-10-CM

## 2022-06-28 NOTE — H&P (View-Only) (Signed)
ELECTROPHYSIOLOGY CONSULT NOTE  Patient ID: Edward Mullen, MRN: [id number], DOB/AGE: 68-Aug-1957 68 y.o. Admit date: (Not on file) Date of Consult: 06/28/2022  Primary Physician: Romana Juniper, MD Primary Cardiologist: CHF     Edward Mullen is a 66 y.o. male who is being seen today for the evaluation of ICD-CRT at the request of DB.    HPI Edward Mullen is a 66 y.o. male referred for consideration of an ICD  Nonischemic cardiomyopathy that has waxed and waned but mostly over the last couple years and remains significantly depressed.  Hospitalization 3/23 was associated with nonsustained ventricular tachycardia and has been treated with amiodarone.  Syncope 5/18, no ICD recommended secondary to ongoing substance abuse.  LifeVest placed.  Amiodarone initiated looks to have been used for about 4 months and then stopped as was his LifeVest.  Reinitiated about a year ago; LifeVest resumed about 4 months ago  PVCs seen repeatedly, mostly with a right bundle branch block morphology but some of the superior axis and some with an inferior axis   The patient denies chest pain, nocturnal dyspnea, orthopnea or peripheral edema.  There have been no palpitations.  Complains of dyspnea on exertion even at 20 to 30 feet.  Sleeps flat.  No edema.  Occasional chest pain.  Recurrent episodes of getting hot and becoming unresponsive.  Has not lost postural tone.  Most recent episode, EMS run no note read red, preceded by severe shortness of breath; he thinks this is happened before.Marland Kitchen   History of repeated cocaine use  DATE TEST EF   1/18 Echo   15-20 %   8/18 Echo  50 %   4/22 Echo     3/23 Echo  25-30%   8/23 Echo  25-30%   10/23 Echo  20-25% MR Mod   Date Cr K Hgb TSH LFT  11/23 1.55<<2.21 4.3 15.2 0.99 (8/23) 40              Past Medical History:  Diagnosis Date   Arthritis    "feels like it in my legs" (09/20/2014)   CHF (congestive heart failure) (HCC)     Dysrhythmia    Hypertension    NSVT (nonsustained ventricular tachycardia) ([place]) 09/06/2016   pre diabetes    patient denies      Surgical History:  Past Surgical History:  Procedure Laterality Date   CARDIAC CATHETERIZATION N/A 08/27/2016   Procedure: Right/Left Heart Cath and Coronary Angiography;  Surgeon: Larey Dresser, MD;  Location: [facility];  Service: Cardiovascular;  Laterality: N/A;   COLONOSCOPY     CYSTECTOMY Right    "back of my shoulder"   RIGHT/LEFT HEART CATH AND CORONARY ANGIOGRAPHY N/A 12/04/2020   Procedure: RIGHT/LEFT HEART CATH AND CORONARY ANGIOGRAPHY;  Surgeon: Larey Dresser, MD;  Location: [facility];  Service: Cardiovascular;  Laterality: N/A;   RIGHT/LEFT HEART CATH AND CORONARY ANGIOGRAPHY N/A 10/10/2021   Procedure: RIGHT/LEFT HEART CATH AND CORONARY ANGIOGRAPHY;  Surgeon: Larey Dresser, MD;  Location: [facility];  Service: Cardiovascular;  Laterality: N/A;   TONSILLECTOMY       Home Meds: Current Meds  Medication Sig   albuterol (PROVENTIL) (2.5 MG/3ML) 0.083% nebulizer solution Take 3 mLs (2.5 mg total) by nebulization every 6 (six) hours as needed for shortness of breath or wheezing.   albuterol (VENTOLIN HFA) 108 (90 Base) MCG/ACT inhaler INHALE 1 TO 2 PUFFS BY MOUTH EVERY 6  HOURS AS NEEDED FOR WHEEZING OR SHORTNESS OF BREATH   amiodarone (PACERONE) 200 MG tablet Take 1 tablet (200 mg total) by mouth daily.   aspirin EC 81 MG tablet Take 1 tablet (81 mg total) by mouth daily. Swallow whole.   bismuth subsalicylate (PEPTO BISMOL) 262 MG/15ML suspension Take 30 mLs by mouth every 6 (six) hours as needed for indigestion.   BREO ELLIPTA 100-25 MCG/ACT AEPB Inhale 1 puff into the lungs daily.   carvedilol (COREG) 3.125 MG tablet Take 1 tablet (3.125 mg total) by mouth 2 (two) times daily.   Cholecalciferol (VITAMIN D3) 50 MCG (2000 UT) TABS Take 1 tablet by mouth daily.   digoxin (LANOXIN) 0.125 MG tablet TAKE 1 TABLET (0.125 MG  TOTAL) BY MOUTH DAILY.   empagliflozin (JARDIANCE) 10 MG TABS tablet Take 1 tablet (10 mg total) by mouth daily.   rosuvastatin (CRESTOR) 5 MG tablet TAKE 1 TABLET (5 MG TOTAL) BY MOUTH DAILY.   spironolactone (ALDACTONE) 25 MG tablet Take 1 tablet (25 mg total) by mouth daily.   tamsulosin (FLOMAX) 0.4 MG CAPS capsule TAKE ONE CAPSULE BY MOUTH ONCE DAILY AFTER SUPPER   torsemide (DEMADEX) 20 MG tablet Take 1 tablet (20 mg total) by mouth daily as needed (extra fluid).   umeclidinium bromide (INCRUSE ELLIPTA) 62.5 MCG/ACT AEPB Inhale 1 puff into the lungs daily.    Allergies: No Known Allergies  Social History   Socioeconomic History   Marital status: Married    Spouse name: Not on file   Number of children: Not on file   Years of education: Not on file   Highest education level: Not on file  Occupational History   Occupation: Engineer, manufacturing systems    Comment: has not been able to work steadily for the last year or two  Tobacco Use   Smoking status: Every Day    Packs/day: 0.10    Years: 48.00    Total pack years: 4.80    Types: Cigarettes   Smokeless tobacco: Never   Tobacco comments:    cutting back 2  per day.  No cigs  Vaping Use   Vaping Use: Never used  Substance and Sexual Activity   Alcohol use: Yes    Alcohol/week: 1.0 standard drink of alcohol    Types: 1 Cans of beer per week    Comment: occasionally   Drug use: Yes    Types: Marijuana    Comment: daily 4 times last 2 weeks ago as of 05/31/2019   Sexual activity: Yes    Birth control/protection: None  Other Topics Concern   Not on file  Social History Narrative   Not on file   Social Determinants of Health   Financial Resource Strain: Low Risk  (06/17/2022)   Overall Financial Resource Strain (CARDIA)    Difficulty of Paying Living Expenses: Not hard at all  Food Insecurity: No Food Insecurity (06/17/2022)   Hunger Vital Sign    Worried About Running Out of Food in the Last Year: Never true    Ran Out of  Food in the Last Year: Never true  Recent Concern: [place] Present (06/08/2022)   Hunger Vital Sign    Worried About Running Out of Food in the Last Year: Sometimes true    Ran Out of Food in the Last Year: Sometimes true  Transportation Needs: No Transportation Needs (06/17/2022)   PRAPARE - Transportation    Lack of Transportation (Medical): No    Lack of  Transportation (Non-Medical): No  Recent Concern: Transportation Needs - Unmet Transportation Needs (06/08/2022)   PRAPARE - Hydrologist (Medical): Yes    Lack of Transportation (Non-Medical): No  Physical Activity: Inactive (06/17/2022)   Exercise Vital Sign    Days of Exercise per Week: 0 days    Minutes of Exercise per Session: 0 min  Stress: No Stress Concern Present (06/17/2022)   [place]    Feeling of Stress : Not at all  Social Connections: Moderately Isolated (06/17/2022)   Social Connection and Isolation Panel [NHANES]    Frequency of Communication with Friends and Family: More than three times a week    Frequency of Social Gatherings with Friends and Family: More than three times a week    Attends Religious Services: Never    Marine scientist or Organizations: No    Attends Archivist Meetings: Never    Marital Status: Married  Human resources officer Violence: Not At Risk (06/17/2022)   Humiliation, Afraid, Rape, and Kick questionnaire    Fear of Current or Ex-Partner: No    Emotionally Abused: No    Physically Abused: No    Sexually Abused: No     Family History  Problem Relation Age of Onset   Stroke Mother 73   Heart disease Father 26   Colon cancer Neg Hx    Colon polyps Neg Hx    Esophageal cancer Neg Hx    Stomach cancer Neg Hx    Rectal cancer Neg Hx      ROS:  Please see the history of present illness.     All other systems reviewed and negative.    Physical  Exam: Blood pressure 106/70, pulse 79, height 6' (1.829 m), weight 200 lb 12.8 oz (91.1 kg), SpO2 97 %. General: Well developed, well nourished male in no acute distress. Head: Normocephalic, atraumatic, sclera non-icteric, no xanthomas, nares are without discharge. EENT: normal  Lymph Nodes:  none Neck: Negative for carotid bruits. JVD not elevated. Back:without scoliosis kyphosis Lungs: Clear bilaterally to auscultation without wheezes, rales, or rhonchi. Breathing is unlabored. Heart: RRR with S1 S2. No   murmur . No rubs, or gallops appreciated. Abdomen: Soft, non-tender, non-distended with normoactive bowel sounds. No hepatomegaly. No rebound/guarding. No obvious abdominal masses. Msk:  Strength and tone appear normal for age. Extremities: No clubbing or cyanosis. No  edema.  Distal pedal pulses are 2+ and equal bilaterally. Skin: Warm and Dry Neuro: Alert and oriented X 3. CN III-XII intact Grossly normal sensory and motor function . Psych:  Responds to questions appropriately with a normal affect.        EKG: Sinus at 79 Intervals 25/16/43 qR aVL and RS V6 Freq PVCs  10/23, qR in lead V1 and qR in lead L   Assessment and Plan:  Nonischemic cardiomyopathy  Congestive heart failure-class IIIb  Left bundle branch block-atypical  PVCs-frequent with a delayed physical deflection  Amiodarone  Longstanding cigarette abuse  Emphysema  Thoracic ascending aneurysm   The patient is appropriately considered for CRT ICD given his recurrent syncope, left ventricular dysfunction and left bundle branch block albeit atypical with a QRS duration of greater than 150 ms.  We have reviewed the potential risks and benefits and he is agreeable to proceeding.  His PVC burden is also very high; quantitation of this will be helpful, an alternative antiarrhythmic drugs would be useful.  It may also  be a role for catheter ablation although the delayed intrinsicoid deflection makes it less  likely.  His amiodarone may also be contribute to his shortness of breath.  He has pulmonary function test pending.  We will need to keep this in mind.  He has stopped smoking finally.    Virl Axe

## 2022-06-28 NOTE — Progress Notes (Signed)
ELECTROPHYSIOLOGY CONSULT NOTE  Patient ID: Edward Mullen, MRN: [id number], DOB/AGE: 1955/12/19 66 y.o. Admit date: (Not on file) Date of Consult: 06/28/2022  Primary Physician: Romana Juniper, MD Primary Cardiologist: CHF     Edward Mullen is a 66 y.o. male who is being seen today for the evaluation of ICD-CRT at the request of Edward Mullen.    HPI Edward Mullen is a 66 y.o. male referred for consideration of an ICD  Nonischemic cardiomyopathy that has waxed and waned but mostly over the last couple years and remains significantly depressed.  Hospitalization 3/23 was associated with nonsustained ventricular tachycardia and has been treated with amiodarone.  Syncope 5/18, no ICD recommended secondary to ongoing substance abuse.  LifeVest placed.  Amiodarone initiated looks to have been used for about 4 months and then stopped as was his LifeVest.  Reinitiated about a year ago; LifeVest resumed about 4 months ago  PVCs seen repeatedly, mostly with a right bundle branch block morphology but some of the superior axis and some with an inferior axis   The patient denies chest pain, nocturnal dyspnea, orthopnea or peripheral edema.  There have been no palpitations.  Complains of dyspnea on exertion even at 20 to 30 feet.  Sleeps flat.  No edema.  Occasional chest pain.  Recurrent episodes of getting hot and becoming unresponsive.  Has not lost postural tone.  Most recent episode, EMS run no note read red, preceded by severe shortness of breath; he thinks this is happened before.Marland Kitchen   History of repeated cocaine use  DATE TEST EF   1/18 Echo   15-20 %   8/18 Echo  50 %   4/22 Echo     3/23 Echo  25-30%   8/23 Echo  25-30%   10/23 Echo  20-25% MR Mod   Date Cr K Hgb TSH LFT  11/23 1.55<<2.21 4.3 15.2 0.99 (8/23) 40              Past Medical History:  Diagnosis Date   Arthritis    "feels like it in my legs" (09/20/2014)   CHF (congestive heart failure) (HCC)     Dysrhythmia    Hypertension    NSVT (nonsustained ventricular tachycardia) ([place]) 09/06/2016   pre diabetes    patient denies      Surgical History:  Past Surgical History:  Procedure Laterality Date   CARDIAC CATHETERIZATION N/A 08/27/2016   Procedure: Right/Left Heart Cath and Coronary Angiography;  Surgeon: Larey Dresser, MD;  Location: [facility];  Service: Cardiovascular;  Laterality: N/A;   COLONOSCOPY     CYSTECTOMY Right    "back of my shoulder"   RIGHT/LEFT HEART CATH AND CORONARY ANGIOGRAPHY N/A 12/04/2020   Procedure: RIGHT/LEFT HEART CATH AND CORONARY ANGIOGRAPHY;  Surgeon: Larey Dresser, MD;  Location: [facility];  Service: Cardiovascular;  Laterality: N/A;   RIGHT/LEFT HEART CATH AND CORONARY ANGIOGRAPHY N/A 10/10/2021   Procedure: RIGHT/LEFT HEART CATH AND CORONARY ANGIOGRAPHY;  Surgeon: Larey Dresser, MD;  Location: [facility];  Service: Cardiovascular;  Laterality: N/A;   TONSILLECTOMY       Home Meds: Current Meds  Medication Sig   albuterol (PROVENTIL) (2.5 MG/3ML) 0.083% nebulizer solution Take 3 mLs (2.5 mg total) by nebulization every 6 (six) hours as needed for shortness of breath or wheezing.   albuterol (VENTOLIN HFA) 108 (90 Base) MCG/ACT inhaler INHALE 1 TO 2 PUFFS BY MOUTH EVERY 6  HOURS AS NEEDED FOR WHEEZING OR SHORTNESS OF BREATH   amiodarone (PACERONE) 200 MG tablet Take 1 tablet (200 mg total) by mouth daily.   aspirin EC 81 MG tablet Take 1 tablet (81 mg total) by mouth daily. Swallow whole.   bismuth subsalicylate (PEPTO BISMOL) 262 MG/15ML suspension Take 30 mLs by mouth every 6 (six) hours as needed for indigestion.   BREO ELLIPTA 100-25 MCG/ACT AEPB Inhale 1 puff into the lungs daily.   carvedilol (COREG) 3.125 MG tablet Take 1 tablet (3.125 mg total) by mouth 2 (two) times daily.   Cholecalciferol (VITAMIN D3) 50 MCG (2000 UT) TABS Take 1 tablet by mouth daily.   digoxin (LANOXIN) 0.125 MG tablet TAKE 1 TABLET (0.125 MG  TOTAL) BY MOUTH DAILY.   empagliflozin (JARDIANCE) 10 MG TABS tablet Take 1 tablet (10 mg total) by mouth daily.   rosuvastatin (CRESTOR) 5 MG tablet TAKE 1 TABLET (5 MG TOTAL) BY MOUTH DAILY.   spironolactone (ALDACTONE) 25 MG tablet Take 1 tablet (25 mg total) by mouth daily.   tamsulosin (FLOMAX) 0.4 MG CAPS capsule TAKE ONE CAPSULE BY MOUTH ONCE DAILY AFTER SUPPER   torsemide (DEMADEX) 20 MG tablet Take 1 tablet (20 mg total) by mouth daily as needed (extra fluid).   umeclidinium bromide (INCRUSE ELLIPTA) 62.5 MCG/ACT AEPB Inhale 1 puff into the lungs daily.    Allergies: No Known Allergies  Social History   Socioeconomic History   Marital status: Married    Spouse name: Not on file   Number of children: Not on file   Years of education: Not on file   Highest education level: Not on file  Occupational History   Occupation: Engineer, manufacturing systems    Comment: has not been able to work steadily for the last year or two  Tobacco Use   Smoking status: Every Day    Packs/day: 0.10    Years: 48.00    Total pack years: 4.80    Types: Cigarettes   Smokeless tobacco: Never   Tobacco comments:    cutting back 2  per day.  No cigs  Vaping Use   Vaping Use: Never used  Substance and Sexual Activity   Alcohol use: Yes    Alcohol/week: 1.0 standard drink of alcohol    Types: 1 Cans of beer per week    Comment: occasionally   Drug use: Yes    Types: Marijuana    Comment: daily 4 times last 2 weeks ago as of 05/31/2019   Sexual activity: Yes    Birth control/protection: None  Other Topics Concern   Not on file  Social History Narrative   Not on file   Social Determinants of Health   Financial Resource Strain: Low Risk  (06/17/2022)   Overall Financial Resource Strain (CARDIA)    Difficulty of Paying Living Expenses: Not hard at all  Food Insecurity: No Food Insecurity (06/17/2022)   Hunger Vital Sign    Worried About Running Out of Food in the Last Year: Never true    Ran Out of  Food in the Last Year: Never true  Recent Concern: [place] Present (06/08/2022)   Hunger Vital Sign    Worried About Running Out of Food in the Last Year: Sometimes true    Ran Out of Food in the Last Year: Sometimes true  Transportation Needs: No Transportation Needs (06/17/2022)   PRAPARE - Transportation    Lack of Transportation (Medical): No    Lack of  Transportation (Non-Medical): No  Recent Concern: Transportation Needs - Unmet Transportation Needs (06/08/2022)   PRAPARE - Hydrologist (Medical): Yes    Lack of Transportation (Non-Medical): No  Physical Activity: Inactive (06/17/2022)   Exercise Vital Sign    Days of Exercise per Week: 0 days    Minutes of Exercise per Session: 0 min  Stress: No Stress Concern Present (06/17/2022)   [place]    Feeling of Stress : Not at all  Social Connections: Moderately Isolated (06/17/2022)   Social Connection and Isolation Panel [NHANES]    Frequency of Communication with Friends and Family: More than three times a week    Frequency of Social Gatherings with Friends and Family: More than three times a week    Attends Religious Services: Never    Marine scientist or Organizations: No    Attends Archivist Meetings: Never    Marital Status: Married  Human resources officer Violence: Not At Risk (06/17/2022)   Humiliation, Afraid, Rape, and Kick questionnaire    Fear of Current or Ex-Partner: No    Emotionally Abused: No    Physically Abused: No    Sexually Abused: No     Family History  Problem Relation Age of Onset   Stroke Mother 83   Heart disease Father 59   Colon cancer Neg Hx    Colon polyps Neg Hx    Esophageal cancer Neg Hx    Stomach cancer Neg Hx    Rectal cancer Neg Hx      ROS:  Please see the history of present illness.     All other systems reviewed and negative.    Physical  Exam: Blood pressure 106/70, pulse 79, height 6' (1.829 m), weight 200 lb 12.8 oz (91.1 kg), SpO2 97 %. General: Well developed, well nourished male in no acute distress. Head: Normocephalic, atraumatic, sclera non-icteric, no xanthomas, nares are without discharge. EENT: normal  Lymph Nodes:  none Neck: Negative for carotid bruits. JVD not elevated. Back:without scoliosis kyphosis Lungs: Clear bilaterally to auscultation without wheezes, rales, or rhonchi. Breathing is unlabored. Heart: RRR with S1 S2. No   murmur . No rubs, or gallops appreciated. Abdomen: Soft, non-tender, non-distended with normoactive bowel sounds. No hepatomegaly. No rebound/guarding. No obvious abdominal masses. Msk:  Strength and tone appear normal for age. Extremities: No clubbing or cyanosis. No  edema.  Distal pedal pulses are 2+ and equal bilaterally. Skin: Warm and Dry Neuro: Alert and oriented X 3. CN III-XII intact Grossly normal sensory and motor function . Psych:  Responds to questions appropriately with a normal affect.        EKG: Sinus at 79 Intervals 25/16/43 qR aVL and RS V6 Freq PVCs  10/23, qR in lead V1 and qR in lead L   Assessment and Plan:  Nonischemic cardiomyopathy  Congestive heart failure-class IIIb  Left bundle branch block-atypical  PVCs-frequent with a delayed physical deflection  Amiodarone  Longstanding cigarette abuse  Emphysema  Thoracic ascending aneurysm   The patient is appropriately considered for CRT ICD given his recurrent syncope, left ventricular dysfunction and left bundle branch block albeit atypical with a QRS duration of greater than 150 ms.  We have reviewed the potential risks and benefits and he is agreeable to proceeding.  His PVC burden is also very high; quantitation of this will be helpful, an alternative antiarrhythmic drugs would be useful.  It may also  be a role for catheter ablation although the delayed intrinsicoid deflection makes it less  likely.  His amiodarone may also be contribute to his shortness of breath.  He has pulmonary function test pending.  We will need to keep this in mind.  He has stopped smoking finally.    Virl Axe

## 2022-06-28 NOTE — Patient Instructions (Signed)
Medication Instructions:  Your physician recommends that you continue on your current medications as directed. Please refer to the Current Medication list given to you today.  *If you need a refill on your cardiac medications before your next appointment, please call your pharmacy*  Lab Work: BMET and CBC If you have labs (blood work) drawn today and your tests are completely normal, you will receive your results only by: [facility] (if you have MyChart) OR A paper copy in the mail If you have any lab test that is abnormal or we need to change your treatment, we will call you to review the results.  Testing/Procedures: Your physician has recommended that you have a defibrillator inserted. An implantable cardioverter defibrillator (ICD) is a small device that is placed in your chest or, in rare cases, your abdomen. This device uses electrical pulses or shocks to help control life-threatening, irregular heartbeats that could lead the heart to suddenly stop beating (sudden cardiac arrest). Leads are attached to the ICD that goes into your heart. This is done in the hospital and usually requires an overnight stay. Please see the instruction sheet given to you today for more information.  Follow-Up: At [facility], you and your health needs are our priority.  As part of our continuing mission to provide you with exceptional heart care, we have created designated Provider Care Teams.  These Care Teams include your primary Cardiologist (physician) and Advanced Practice Providers (APPs -  Physician Assistants and Nurse Practitioners) who all work together to provide you with the care you need, when you need it.  Your next appointment:   See instruction letter  Important Information About [place]

## 2022-06-29 LAB — CBC WITH DIFFERENTIAL/PLATELET
Basophils Absolute: 0 10*3/uL (ref 0.0–0.2)
Basos: 1 %
EOS (ABSOLUTE): 0.2 10*3/uL (ref 0.0–0.4)
Eos: 2 %
Hematocrit: 43.1 % (ref 37.5–51.0)
Hemoglobin: 13.5 g/dL (ref 13.0–17.7)
Immature Grans (Abs): 0 10*3/uL (ref 0.0–0.1)
Immature Granulocytes: 0 %
Lymphocytes Absolute: 1.4 10*3/uL (ref 0.7–3.1)
Lymphs: 22 %
MCH: 27.3 pg (ref 26.6–33.0)
MCHC: 31.3 g/dL — ABNORMAL LOW (ref 31.5–35.7)
MCV: 87 fL (ref 79–97)
Monocytes Absolute: 1 10*3/uL — ABNORMAL HIGH (ref 0.1–0.9)
Monocytes: 15 %
Neutrophils Absolute: 3.8 10*3/uL (ref 1.4–7.0)
Neutrophils: 60 %
Platelets: 180 10*3/uL (ref 150–450)
RBC: 4.95 x10E6/uL (ref 4.14–5.80)
RDW: 14.4 % (ref 11.6–15.4)
WBC: 6.4 10*3/uL (ref 3.4–10.8)

## 2022-06-29 LAB — BASIC METABOLIC PANEL
BUN/Creatinine Ratio: 13 (ref 10–24)
BUN: 19 mg/dL (ref 8–27)
CO2: 23 mmol/L (ref 20–29)
Calcium: 9.2 mg/dL (ref 8.6–10.2)
Chloride: 106 mmol/L (ref 96–106)
Creatinine, Ser: 1.51 mg/dL — ABNORMAL HIGH (ref 0.76–1.27)
Glucose: 75 mg/dL (ref 70–99)
Potassium: 4.7 mmol/L (ref 3.5–5.2)
Sodium: 142 mmol/L (ref 134–144)
eGFR: 50 mL/min/{1.73_m2} — ABNORMAL LOW (ref >59)

## 2022-07-04 NOTE — Progress Notes (Signed)
Internal Medicine Clinic Attending  Case discussed with Dr. Masters  at the time of the visit.  We reviewed the resident's history and exam and pertinent patient test results.  I agree with the assessment, diagnosis, and plan of care documented in the resident's note.  

## 2022-07-08 NOTE — Progress Notes (Signed)
Internal Medicine Clinic Attending  Case and documentation of Dr. Masters  soon after the resident saw the patient reviewed.  I reviewed the AWV findings.  I agree with the assessment, diagnosis, and plan of care documented in the AWV note.     

## 2022-07-11 ENCOUNTER — Telehealth (HOSPITAL_COMMUNITY): Payer: Self-pay

## 2022-07-11 NOTE — Telephone Encounter (Signed)
Attempted multiple times this week to reach patient to schedule home paramedicine visit. I will attempt again next week.   [name], [place] 07/11/2022

## 2022-07-16 NOTE — Pre-Procedure Instructions (Signed)
Instructed patient on the following items: Arrival time 1030  Nothing to eat or drink after midnight No meds AM of procedure Responsible person to drive you home and stay with you for 24 hrs Wash with special soap night before and morning of procedure 

## 2022-07-17 ENCOUNTER — Ambulatory Visit (HOSPITAL_COMMUNITY): Payer: Medicare Other

## 2022-07-17 ENCOUNTER — Ambulatory Visit (HOSPITAL_COMMUNITY)
Admission: RE | Admit: 2022-07-17 | Discharge: 2022-07-17 | Disposition: A | Discharge status: Home / Self Care | Payer: Medicare Other | Attending: Internal Medicine | Admitting: Internal Medicine

## 2022-07-17 ENCOUNTER — Encounter (HOSPITAL_COMMUNITY)
Admission: RE | Disposition: A | Discharge status: Home / Self Care | Payer: Self-pay | Source: Home / Self Care | Attending: Internal Medicine

## 2022-07-17 DIAGNOSIS — I428 Other cardiomyopathies: Secondary | ICD-10-CM | POA: Diagnosis not present

## 2022-07-17 DIAGNOSIS — I452 Bifascicular block: Secondary | ICD-10-CM | POA: Diagnosis not present

## 2022-07-17 DIAGNOSIS — I509 Heart failure, unspecified: Secondary | ICD-10-CM | POA: Diagnosis not present

## 2022-07-17 DIAGNOSIS — I472 Ventricular tachycardia, unspecified: Secondary | ICD-10-CM | POA: Insufficient documentation

## 2022-07-17 DIAGNOSIS — I110 Hypertensive heart disease with heart failure: Secondary | ICD-10-CM | POA: Insufficient documentation

## 2022-07-17 DIAGNOSIS — I429 Cardiomyopathy, unspecified: Secondary | ICD-10-CM | POA: Diagnosis not present

## 2022-07-17 DIAGNOSIS — Z87891 Personal history of nicotine dependence: Secondary | ICD-10-CM | POA: Diagnosis not present

## 2022-07-17 DIAGNOSIS — I493 Ventricular premature depolarization: Secondary | ICD-10-CM | POA: Diagnosis not present

## 2022-07-17 DIAGNOSIS — Z79899 Other long term (current) drug therapy: Secondary | ICD-10-CM | POA: Insufficient documentation

## 2022-07-17 DIAGNOSIS — J439 Emphysema, unspecified: Secondary | ICD-10-CM | POA: Diagnosis not present

## 2022-07-17 DIAGNOSIS — I714 Abdominal aortic aneurysm, without rupture, unspecified: Secondary | ICD-10-CM | POA: Insufficient documentation

## 2022-07-17 HISTORY — PX: BIV ICD INSERTION CRT-D: EP1195

## 2022-07-17 LAB — POCT I-STAT, CHEM 8
BUN: 17 mg/dL (ref 8–23)
Calcium, Ion: 1.2 mmol/L (ref 1.15–1.40)
Chloride: 107 mmol/L (ref 98–111)
Creatinine, Ser: 1.5 mg/dL — ABNORMAL HIGH (ref 0.61–1.24)
Glucose, Bld: 74 mg/dL (ref 70–99)
HCT: 43 % (ref 39.0–52.0)
Hemoglobin: 14.6 g/dL (ref 13.0–17.0)
Potassium: 4.2 mmol/L (ref 3.5–5.1)
Sodium: 143 mmol/L (ref 135–145)
TCO2: 22 mmol/L (ref 22–32)

## 2022-07-17 SURGERY — BIV ICD INSERTION CRT-D

## 2022-07-17 MED ORDER — MIDAZOLAM HCL 5 MG/5ML IJ SOLN
INTRAMUSCULAR | Status: AC
  Filled 2022-07-17: qty 5

## 2022-07-17 MED ORDER — LIDOCAINE HCL 1 % IJ SOLN
INTRAMUSCULAR | Status: AC
  Filled 2022-07-17: qty 20

## 2022-07-17 MED ORDER — FENTANYL CITRATE (PF) 100 MCG/2ML IJ SOLN
INTRAMUSCULAR | Status: DC | PRN
  Administered 2022-07-17 (×3): 25 ug via INTRAVENOUS
  Administered 2022-07-17: 50 ug via INTRAVENOUS
  Administered 2022-07-17 (×2): 25 ug via INTRAVENOUS

## 2022-07-17 MED ORDER — CEFAZOLIN SODIUM-DEXTROSE 2-4 GM/100ML-% IV SOLN
INTRAVENOUS | Status: AC
  Filled 2022-07-17: qty 100

## 2022-07-17 MED ORDER — LIDOCAINE HCL (PF) 1 % IJ SOLN
INTRAMUSCULAR | Status: AC
  Filled 2022-07-17: qty 60

## 2022-07-17 MED ORDER — SODIUM CHLORIDE 0.9 % IV SOLN: INTRAVENOUS | Status: DC

## 2022-07-17 MED ORDER — FENTANYL CITRATE (PF) 100 MCG/2ML IJ SOLN
INTRAMUSCULAR | Status: AC
  Filled 2022-07-17: qty 2

## 2022-07-17 MED ORDER — IOHEXOL 350 MG/ML SOLN
INTRAVENOUS | Status: DC | PRN
  Administered 2022-07-17: 12.5 mL

## 2022-07-17 MED ORDER — HEPARIN (PORCINE) IN NACL 1000-0.9 UT/500ML-% IV SOLN
INTRAVENOUS | Status: DC | PRN
  Administered 2022-07-17 (×2): 500 mL

## 2022-07-17 MED ORDER — ONDANSETRON HCL 4 MG/2ML IJ SOLN: 4.0000 mg | Freq: Four times a day (QID) | INTRAMUSCULAR | Status: DC | PRN

## 2022-07-17 MED ORDER — POVIDONE-IODINE 10 % EX SWAB
2.0000 | Freq: Once | CUTANEOUS | Status: AC
  Administered 2022-07-17: 2 via TOPICAL

## 2022-07-17 MED ORDER — CEFAZOLIN SODIUM-DEXTROSE 2-4 GM/100ML-% IV SOLN
2.0000 g | INTRAVENOUS | Status: AC
  Administered 2022-07-17: 2 g via INTRAVENOUS

## 2022-07-17 MED ORDER — MIDAZOLAM HCL 5 MG/5ML IJ SOLN
INTRAMUSCULAR | Status: DC | PRN
  Administered 2022-07-17: 1 mg via INTRAVENOUS
  Administered 2022-07-17 (×2): 2 mg via INTRAVENOUS
  Administered 2022-07-17 (×3): 1 mg via INTRAVENOUS

## 2022-07-17 MED ORDER — SODIUM CHLORIDE 0.9 % IV SOLN
80.0000 mg | INTRAVENOUS | Status: AC
  Administered 2022-07-17: 80 mg

## 2022-07-17 MED ORDER — SODIUM CHLORIDE 0.9 % IV SOLN
INTRAVENOUS | Status: AC
  Filled 2022-07-17: qty 2

## 2022-07-17 MED ORDER — ACETAMINOPHEN 325 MG PO TABS: 325.0000 mg | ORAL_TABLET | ORAL | Status: DC | PRN

## 2022-07-17 MED ORDER — HEPARIN (PORCINE) IN NACL 1000-0.9 UT/500ML-% IV SOLN
INTRAVENOUS | Status: AC
  Filled 2022-07-17: qty 500

## 2022-07-17 MED ORDER — LIDOCAINE HCL (PF) 1 % IJ SOLN
INTRAMUSCULAR | Status: DC | PRN
  Administered 2022-07-17: 60 mL

## 2022-07-17 SURGICAL SUPPLY — 19 items
ASSURA CRTD CD3369-40C (ICD Generator) ×1 IMPLANT
BALLN COR SINUS VENO 6FR 80 (BALLOONS) ×1
BALLOON COR SINUS VENO 6FR 80 (BALLOONS) IMPLANT
CABLE SURGICAL S-101-97-12 (CABLE) ×1 IMPLANT
CATH ATTAIN SEL SURV 6248V-130 (CATHETERS) IMPLANT
CATH CPS DIRECT 135 DS2C020 (CATHETERS) IMPLANT
DEFIB ASSURA CRT-D (ICD Generator) IMPLANT
LEAD DURATA 7122-65CM (Lead) IMPLANT
LEAD QUARTET 1456Q-86 (Lead) IMPLANT
LEAD ULTIPACE 52 LPA1231/52 (Lead) IMPLANT
PAD DEFIB RADIO PHYSIO CONN (PAD) ×1 IMPLANT
QUARTET 1456Q-86 (Lead) ×1 IMPLANT
SHEATH 7FR PRELUDE SNAP 13 (SHEATH) IMPLANT
SHEATH 9.5FR PRELUDE SNAP 13 (SHEATH) IMPLANT
SHEATH WORLEY 9FR 62CM (SHEATH) IMPLANT
SLITTER AGILIS HISPRO (INSTRUMENTS) IMPLANT
TRAY PACEMAKER INSERTION (PACKS) ×1 IMPLANT
WIRE ACUITY WHISPER EDS 4648 (WIRE) IMPLANT
WIRE HI TORQ VERSACORE-J 145CM (WIRE) IMPLANT

## 2022-07-17 NOTE — Interval H&P Note (Signed)
History and Physical Interval Note:  07/17/2022 12:04 PM  Edward Mullen  has presented today for surgery, with the diagnosis of chf.  The various methods of treatment have been discussed with the patient and family. After consideration of risks, benefits and other options for treatment, the patient has consented to  Procedure(s): BIV ICD INSERTION CRT-D (N/A) as a surgical intervention.  The patient's history has been reviewed, patient examined, no change in status, stable for surgery.  I have reviewed the patient's chart and labs.  Questions were answered to the patient's satisfaction.     Virl Axe

## 2022-07-17 NOTE — Discharge Instructions (Signed)
After Your ICD (Implantable Cardiac Defibrillator)   You have a Abbott ICD  ACTIVITY Do not lift your arm above shoulder height for 1 week after your procedure. After 7 days, you may progress as below.  You should remove your sling 24 hours after your procedure, unless otherwise instructed by your provider.     Wednesday July 24, 2022  Thursday July 25, 2022 Friday July 26, 2022 Saturday July 27, 2022   Do not lift, push, pull, or carry anything over 10 pounds with the affected arm until 6 weeks (Wednesday August 28, 2022 ) after your procedure.   You may drive AFTER your wound check, unless you have been told otherwise by your provider.   Ask your healthcare provider when you can go back to work   INCISION/Dressing If you are on a blood thinner such as Coumadin, Xarelto, Eliquis, Plavix, or Pradaxa please confirm with your provider when this should be resumed.   If large square, outer bandage is left in place, this can be removed after 24 hours from your procedure. Do not remove steri-strips or glue as below.   Monitor your defibrillator site for redness, swelling, and drainage. Call the device clinic at [phone] if you experience these symptoms or fever/chills.  If your incision is closed with Dermabond/Surgical glue. You may shower 1 day after your pacemaker implant and wash around the site with soap and water.    If you were discharged in a sling, please do not wear this during the day more than 48 hours after your surgery unless otherwise instructed. This may increase the risk of stiffness and soreness in your shoulder.   Avoid lotions, ointments, or perfumes over your incision until it is well-healed.  You may use a hot tub or a pool AFTER your wound check appointment if the incision is completely closed.  Your ICD is designed to protect you from life threatening heart rhythms. Because of this, you may receive a shock.   1 shock with no symptoms:  Call  the office during business hours. 1 shock with symptoms (chest pain, chest pressure, dizziness, lightheadedness, shortness of breath, overall feeling unwell):  Call 911. If you experience 2 or more shocks in 24 hours:  Call 911. If you receive a shock, you should not drive for 6 months per the [place] DMV IF you receive appropriate therapy from your ICD.   ICD Alerts:  Some alerts are vibratory and others beep. These are NOT emergencies. Please call our office to let us know. If this occurs at night or on weekends, it can wait until the next business day. Send a remote transmission.  If your device is capable of reading fluid status (for heart failure), you will be offered monthly monitoring to review this with you.   DEVICE MANAGEMENT Remote monitoring is used to monitor your ICD from home. This monitoring is scheduled every 91 days by our office. It allows [place] to keep an eye on the functioning of your device to ensure it is working properly. You will routinely see your Electrophysiologist annually (more often if necessary).   You should receive your ID card for your new device in 4-8 weeks. Keep this card with you at all times once received. Consider wearing a medical alert bracelet or necklace.  Your ICD  may be MRI compatible. This will be discussed at your next office visit/wound check.  You should avoid contact with strong electric or magnetic fields.   Do not use amateur (ham)  radio equipment or electric (arc) Clinical cytogeneticist. MP3 player headphones with magnets should not be used. Some devices are safe to use if held at least 12 inches (30 cm) from your defibrillator. These include power tools, lawn mowers, and speakers. If you are unsure if something is safe to use, ask your health care provider.  When using your cell phone, hold it to the ear that is on the opposite side from the defibrillator. Do not leave your cell phone in a pocket over the defibrillator.  You may safely use electric  blankets, heating pads, computers, and microwave ovens.  Call the office right away if: You have chest pain. You feel more than one shock. You feel more short of breath than you have felt before. You feel more light-headed than you have felt before. Your incision starts to open up.  This information is not intended to replace advice given to you by your health care provider. Make sure you discuss any questions you have with your health care provider.

## 2022-07-17 NOTE — Progress Notes (Signed)
Called and spoke with Caryl Comes, MD regarding patients CXR results. MD stated that patient was okay to be discharged once device rep comes to see patient at around 1900 or 1930.

## 2022-07-18 ENCOUNTER — Encounter (HOSPITAL_COMMUNITY): Payer: Self-pay | Admitting: Internal Medicine

## 2022-07-18 ENCOUNTER — Other Ambulatory Visit: Payer: Self-pay

## 2022-07-18 ENCOUNTER — Telehealth: Payer: Self-pay

## 2022-07-18 ENCOUNTER — Other Ambulatory Visit (HOSPITAL_COMMUNITY): Payer: Self-pay

## 2022-07-18 NOTE — Telephone Encounter (Signed)
-----   Message from [place], [place] sent at 07/17/2022  4:55 PM EST ----- [name] implant today Same day d/c SK No a/c

## 2022-07-18 NOTE — Progress Notes (Signed)
Paramedicine Encounter    Patient ID: Edward Mullen, male    DOB: [birth year], 66 y.o.   MRN: [id number]  Arrived for home visit for Edward Mullen who reports to be feeling good. He denied shortness of breath, dizziness, weight gain or swelling. He reports some mild tenderness around the left upper chest where his ICD was implanted yesterday. No swelling or redness or drainage noted. He reports he has been feeling good. He has been med compliant. I picked up his bubble packs today and delivered.  I obtained vitals and assessment:  WT- 205lbs BP- 118/70 HR- 85 RR- 18 O2- 100%  Lungs- clear   Edema- none   He reports he has been urinating better and is following up with [facility] who he will see on Friday this week.   We reviewed upcoming appointments and confirmed same. Home visit complete. I will follow up in 2-4 weeks. [name] knows to reach out if needed.   [name], [place] 07/18/2022   Patient Care Team: Romana Juniper, MD as PCP - General Drema Pry as Social Worker  Patient Active Problem List   Diagnosis Date Noted   COPD (chronic obstructive pulmonary disease) ([place]) 06/07/2022   Hyperlipemia 04/04/2022   First time seizure ([place]) 12/28/2021   Type 2 diabetes mellitus with stage 3b chronic kidney disease, without long-term current use of insulin ([place]) 10/05/2021   Hemoptysis    Acute exacerbation of CHF (congestive heart failure) ([place]) 10/04/2021   Urinary retention    Scrotal mass 08/01/2020   Nocturia 08/01/2020   Lumbar spinal stenosis 06/08/2019   Healthcare maintenance 05/11/2019   Housing problems 05/11/2019   Difficulty urinating 04/07/2018   Colon cancer screening 04/07/2018   Anemia 12/19/2016   CKD (chronic kidney disease) stage 3, GFR 30-59 ml/min ([facility]) 12/13/2016   Acute on chronic systolic heart failure ([place]) 09/06/2016   Non-ischemic cardiomyopathy ([place]) 08/27/2016   Non-obstructive hypertrophic  cardiomyopathy ([place]) 08/27/2016   Mitral regurgitation 08/27/2016   Polysubstance abuse ([place])    AKI (acute kidney injury) ([place]) 08/23/2016   Tobacco abuse 09/19/2014   HTN (hypertension) 03/28/2011   Bilateral lower extremity pain 03/28/2011    Current Outpatient Medications:    albuterol (PROVENTIL) (2.5 MG/3ML) 0.083% nebulizer solution, Take 3 mLs (2.5 mg total) by nebulization every 6 (six) hours as needed for shortness of breath or wheezing., Disp: 75 mL, Rfl: 2   albuterol (VENTOLIN HFA) 108 (90 Base) MCG/ACT inhaler, INHALE 1 TO 2 PUFFS BY MOUTH EVERY 6 HOURS AS NEEDED FOR WHEEZING OR SHORTNESS OF BREATH, Disp: 18 g, Rfl: 2   amiodarone (PACERONE) 200 MG tablet, Take 1 tablet (200 mg total) by mouth daily., Disp: 30 tablet, Rfl: 3   aspirin EC 81 MG tablet, Take 1 tablet (81 mg total) by mouth daily. Swallow whole., Disp: 30 tablet, Rfl: 11   bismuth subsalicylate (PEPTO BISMOL) 262 MG/15ML suspension, Take 30 mLs by mouth every 6 (six) hours as needed for indigestion., Disp: , Rfl:    BREO ELLIPTA 100-25 MCG/ACT AEPB, Inhale 1 puff into the lungs daily., Disp: , Rfl:    carvedilol (COREG) 3.125 MG tablet, Take 1 tablet (3.125 mg total) by mouth 2 (two) times daily., Disp: 60 tablet, Rfl: 11   Cholecalciferol (VITAMIN D3) 50 MCG (2000 UT) TABS, Take 1 tablet by mouth daily., Disp: , Rfl:    digoxin (LANOXIN) 0.125 MG tablet, TAKE 1 TABLET (0.125 MG TOTAL) BY MOUTH DAILY., Disp: 90 tablet, Rfl: 1  empagliflozin (JARDIANCE) 10 MG TABS tablet, Take 1 tablet (10 mg total) by mouth daily., Disp: 30 tablet, Rfl: 6   rosuvastatin (CRESTOR) 5 MG tablet, TAKE 1 TABLET (5 MG TOTAL) BY MOUTH DAILY., Disp: 90 tablet, Rfl: 3   spironolactone (ALDACTONE) 25 MG tablet, Take 1 tablet (25 mg total) by mouth daily., Disp: 90 tablet, Rfl: 3   tamsulosin (FLOMAX) 0.4 MG CAPS capsule, TAKE ONE CAPSULE BY MOUTH ONCE DAILY AFTER SUPPER, Disp: 30 capsule, Rfl: 2   torsemide (DEMADEX) 20 MG tablet, Take 1  tablet (20 mg total) by mouth daily as needed (extra fluid)., Disp: 30 tablet, Rfl: 2   umeclidinium bromide (INCRUSE ELLIPTA) 62.5 MCG/ACT AEPB, Inhale 1 puff into the lungs daily., Disp: 90 each, Rfl: 3 No Known Allergies   Social History   Socioeconomic History   Marital status: Married    Spouse name: Not on file   Number of children: Not on file   Years of education: Not on file   Highest education level: Not on file  Occupational History   Occupation: Engineer, manufacturing systems    Comment: has not been able to work steadily for the last year or two  Tobacco Use   Smoking status: Every Day    Packs/day: 0.10    Years: 48.00    Total pack years: 4.80    Types: Cigarettes   Smokeless tobacco: Never   Tobacco comments:    cutting back 2  per day.  No cigs  Vaping Use   Vaping Use: Never used  Substance and Sexual Activity   Alcohol use: Yes    Alcohol/week: 1.0 standard drink of alcohol    Types: 1 Cans of beer per week    Comment: occasionally   Drug use: Yes    Types: Marijuana    Comment: daily 4 times last 2 weeks ago as of 05/31/2019   Sexual activity: Yes    Birth control/protection: None  Other Topics Concern   Not on file  Social History Narrative   Not on file   Social Determinants of Health   Financial Resource Strain: Low Risk  (06/17/2022)   Overall Financial Resource Strain (CARDIA)    Difficulty of Paying Living Expenses: Not hard at all  Food Insecurity: No Food Insecurity (06/17/2022)   Hunger Vital Sign    Worried About Running Out of Food in the Last Year: Never true    Ran Out of Food in the Last Year: Never true  Recent Concern: Food Insecurity - Food Insecurity Present (06/08/2022)   Hunger Vital Sign    Worried About Running Out of Food in the Last Year: Sometimes true    Ran Out of Food in the Last Year: Sometimes true  Transportation Needs: No Transportation Needs (06/17/2022)   PRAPARE - Hydrologist (Medical): No     Lack of Transportation (Non-Medical): No  Recent Concern: Transportation Needs - Unmet Transportation Needs (06/08/2022)   PRAPARE - Hydrologist (Medical): Yes    Lack of Transportation (Non-Medical): No  Physical Activity: Inactive (06/17/2022)   Exercise Vital Sign    Days of Exercise per Week: 0 days    Minutes of Exercise per Session: 0 min  Stress: No Stress Concern Present (06/17/2022)   [place]    Feeling of Stress : Not at all  Social Connections: Moderately Isolated (06/17/2022)   Social Connection and Isolation Panel [  NHANES]    Frequency of Communication with Friends and Family: More than three times a week    Frequency of Social Gatherings with Friends and Family: More than three times a week    Attends Religious Services: Never    Marine scientist or Organizations: No    Attends Archivist Meetings: Never    Marital Status: Married  Human resources officer Violence: Not At Risk (06/17/2022)   Humiliation, Afraid, Rape, and Kick questionnaire    Fear of Current or Ex-Partner: No    Emotionally Abused: No    Physically Abused: No    Sexually Abused: No    Physical Exam      Future Appointments  Date Time Provider [place]  07/31/2022  9:20 AM [facility]  10/21/2022  2:15 PM Deboraha Sprang, MD [facility]     ACTION: Home visit completed

## 2022-07-18 NOTE — Telephone Encounter (Signed)
Follow-up after same day discharge: Implant date: 07/17/2022 MD: Virl Axe, MD Device: [place] [phone] Barnie Mort MP Location: Left Chest   Wound check visit: 07/31/2022 @ 9:20 AM 90 day MD follow-up: 10/20/2021 @ 2:15 PM  Remote Transmission received:Yes  Dressing/sling removed: Will remove tonight  Confirm OAC restart on: N/A

## 2022-07-26 ENCOUNTER — Ambulatory Visit: Payer: Self-pay | Admitting: Licensed Clinical Social Worker

## 2022-07-26 NOTE — Patient Outreach (Signed)
SW removed from Care Team.  Nicklos Gaxiola, BSW, MSW, LCSW-A  Social Worker [facility] Care Management  [phone] 

## 2022-07-29 ENCOUNTER — Telehealth: Payer: Self-pay

## 2022-07-29 NOTE — Telephone Encounter (Signed)
Pt told scheduler that someone called stating something was wrong with his monitor.   I called the patient back to clarify to him that the monitor is fine. His ICD gave him some therapy and that is why he got the phone call. No answer/ no voicemail.

## 2022-07-29 NOTE — Telephone Encounter (Signed)
Following alert received from [employer] received for Device alert for sustained VT with successful ATP therapy. Events occurred 12/15 @ 22:45, EGM shows sustained VT, rate 214 falling into the VT-2 zone, ATP delivered x2 converting to regular AS/BiV pace.  There is a sustained VT event prior @ 22:40, rate 210 falling into the VT-1 zone, duration 7mn. Presenting regular AS/BiV pace.  Patient called and reports during this time he was awake and not aware of ATP therapies. Denies any symptoms and reports feeling well. Has no complaints. Reports compliance with meds on file including doses, no missed or skipped doses. Advised of [place] DMV driving restrictions x6 months/ shock plan. Advised I will forward to Dr. KCaryl Comesfor review.

## 2022-07-31 ENCOUNTER — Ambulatory Visit: Payer: Medicare Other | Attending: Internal Medicine

## 2022-07-31 DIAGNOSIS — I428 Other cardiomyopathies: Secondary | ICD-10-CM | POA: Diagnosis not present

## 2022-07-31 LAB — CUP PACEART INCLINIC DEVICE CHECK
Battery Remaining Longevity: 81 mo
Brady Statistic RA Percent Paced: 19 %
Brady Statistic RV Percent Paced: 95 %
Date Time Interrogation Session: 20231220142639
Implantable Lead Connection Status: 753985
Implantable Lead Connection Status: 753985
Implantable Lead Connection Status: 753985
Implantable Lead Implant Date: 20231206
Implantable Lead Implant Date: 20231206
Implantable Lead Implant Date: 20231206
Implantable Lead Location: 753858
Implantable Lead Location: 753859
Implantable Lead Location: 753860
Implantable Lead Model: 7122
Implantable Pulse Generator Implant Date: 20231206
Lead Channel Pacing Threshold Amplitude: 0.625 V
Lead Channel Pacing Threshold Amplitude: 0.75 V
Lead Channel Pacing Threshold Amplitude: 0.75 V
Lead Channel Pacing Threshold Pulse Width: 0.5 ms
Lead Channel Pacing Threshold Pulse Width: 1 ms
Lead Channel Pacing Threshold Pulse Width: 1 ms
Lead Channel Sensing Intrinsic Amplitude: 5 mV
Lead Channel Sensing Intrinsic Amplitude: 6 mV
Lead Channel Setting Pacing Amplitude: 1.5 V
Lead Channel Setting Pacing Amplitude: 2 V
Lead Channel Setting Pacing Amplitude: 3.5 V
Lead Channel Setting Pacing Pulse Width: 0.5 ms
Lead Channel Setting Pacing Pulse Width: 1 ms
Lead Channel Setting Sensing Sensitivity: 0.5 mV
Pulse Gen Serial Number: 5556126
Zone Setting Status: 755011

## 2022-07-31 MED ORDER — MEXILETINE HCL 150 MG PO CAPS: 150.0000 mg | ORAL_CAPSULE | Freq: Two times a day (BID) | ORAL | 3 refills | Status: DC

## 2022-07-31 MED ORDER — AMIODARONE HCL 200 MG PO TABS: ORAL_TABLET | ORAL | 3 refills | Status: DC

## 2022-07-31 NOTE — Telephone Encounter (Signed)
Not sure why I had programmed that way, but alas Will reprogram VT to 175 has VT rates ranged from 210>>191 Will increase amio and would add mex-- sent text to DB

## 2022-07-31 NOTE — Progress Notes (Signed)
Wound check appointment. Steri-strips removed. Wound without redness or edema. Incision edges approximated, wound well healed. Normal device function. Thresholds, sensing, and impedances consistent with implant measurements. Device programmed at 3.5V for extra safety margin until 3 month visit. Histogram distribution appropriate for patient and level of activity. Patient educated about wound care, arm mobility, lifting restrictions, shock plan. ROV in 3 months with implanting physician.  Pt with ATP therapy since implant.  Reprogrammed therapy zones per Dr. Idalia Needle reduced to 150 BPM, VT-2 reduced to 175 BPM. Some diaphragmatic stim per Pt-reprogrammed LV output to 1.5V at 1 ms-threshold 1V at 1 ms. Pt amio increased and mexiletine added to medication regimen. Follow up scheduled for one month to reevaluate.

## 2022-07-31 NOTE — Telephone Encounter (Signed)
Pt seen in device clinic for wound check and evaluated with Dr. Caryl Comes.  Programming changes made and medication changes made.  See device clinic visit.

## 2022-07-31 NOTE — Patient Instructions (Addendum)
Medication instructions:  INCREASE your AMIODARONE 200 mg-   Take 2 tablets by mouth twice a day for 2 weeks THEN: Take 1 tablet by mouth twice a day for 2 weeks THEN: Take 1 tablet by mouth once a day thereafter. START mexiletine 150 mg- Take one tablet by mouth twice a day  After Your ICD (Implantable Cardiac Defibrillator)   Monitor your defibrillator site for redness, swelling, and drainage. Call the device clinic at [phone] if you experience these symptoms or fever/chills.  Your incision was closed with Dermabond:  You may shower 1 day after your defibrillator implant and wash your incision with soap and water. Avoid lotions, ointments, or perfumes over your incision until it is well-healed.  You may use a hot tub or a pool after your wound check appointment if the incision is completely closed.  Do not lift, push or pull greater than 10 pounds with the affected arm until 6 weeks after your procedure. There are no other restrictions in arm movement after your wound check appointment.  Your ICD is designed to protect you from life threatening heart rhythms. Because of this, you may receive a shock.   1 shock with no symptoms:  Call the office during business hours. 1 shock with symptoms (chest pain, chest pressure, dizziness, lightheadedness, shortness of breath, overall feeling unwell):  Call 911. If you experience 2 or more shocks in 24 hours:  Call 911. If you receive a shock, you should not drive.  [place] DMV - no driving for 6 months if you receive appropriate therapy from your ICD.   ICD Alerts:  Some alerts are vibratory and others beep. These are NOT emergencies. Please call our office to let us know. If this occurs at night or on weekends, it can wait until the next business day. Send a remote transmission.  If your device is capable of reading fluid status (for heart failure), you will be offered monthly monitoring to review this with you.   Remote monitoring is used to  monitor your ICD from home. This monitoring is scheduled every 91 days by our office. It allows [place] to keep an eye on the functioning of your device to ensure it is working properly. You will routinely see your Electrophysiologist annually (more often if necessary).

## 2022-08-01 ENCOUNTER — Telehealth (HOSPITAL_COMMUNITY): Payer: Self-pay

## 2022-08-01 ENCOUNTER — Other Ambulatory Visit (HOSPITAL_COMMUNITY): Payer: Self-pay

## 2022-08-01 NOTE — Telephone Encounter (Signed)
[facility] reporting they need new prescriptions for Edward Mullen's nebulizer's and inhalers.   -Proventil -Albuterol -Breo Ellipta   I will forward to PCP to have them send in same to [place] for Edward Mullen as he is needing same.   Edward Mullen, [place] 08/01/2022

## 2022-08-01 NOTE — Progress Notes (Signed)
Paramedicine Encounter    Patient ID: Edward Mullen, male    DOB: [birth year], 66 y.o.   MRN: [id number]  Arrived for home visit for [name] who reports to be feeling good today. He denied any chest pain, dizziness or shortness of breath. He states he is feeling much better today than he was a few days ago. I obtained vitals and assessment.   WT- 205lbs BP- 102/62 HR- 64 O2- 99% RR- 18  Lungs- clear Edema- none   He reports [facility] new medication doses. I picked up same from pharmacy and confirmed changes and accuracy. Bubble packs were correct. He was given two weeks due to Amiodarone dose changing after 14 days.   Patient and I discussed med compliance and he verbalized understanding.   He reports needing more Proventil Nebulizers I will message PCP for more refills.   Appointments discussed. He knows to reach out if needed. I will follow up in two weeks. He agreed.   Edward Mullen, [place] 08/01/2022      Patient Care Team: Romana Juniper, MD as PCP - General  Patient Active Problem List   Diagnosis Date Noted   COPD (chronic obstructive pulmonary disease) ([place]) 06/07/2022   Hyperlipemia 04/04/2022   First time seizure ([place]) 12/28/2021   Type 2 diabetes mellitus with stage 3b chronic kidney disease, without long-term current use of insulin ([place]) 10/05/2021   Hemoptysis    Acute exacerbation of CHF (congestive heart failure) ([place]) 10/04/2021   Urinary retention    Scrotal mass 08/01/2020   Nocturia 08/01/2020   Lumbar spinal stenosis 06/08/2019   Healthcare maintenance 05/11/2019   Housing problems 05/11/2019   Difficulty urinating 04/07/2018   Colon cancer screening 04/07/2018   Anemia 12/19/2016   CKD (chronic kidney disease) stage 3, GFR 30-59 ml/min (HCC) 12/13/2016   Acute on chronic systolic heart failure ([place]) 09/06/2016   Non-ischemic cardiomyopathy ([place]) 08/27/2016    Non-obstructive hypertrophic cardiomyopathy ([place]) 08/27/2016   Mitral regurgitation 08/27/2016   Polysubstance abuse ([place])    AKI (acute kidney injury) ([place]) 08/23/2016   Tobacco abuse 09/19/2014   HTN (hypertension) 03/28/2011   Bilateral lower extremity pain 03/28/2011    Current Outpatient Medications:    albuterol (PROVENTIL) (2.5 MG/3ML) 0.083% nebulizer solution, Take 3 mLs (2.5 mg total) by nebulization every 6 (six) hours as needed for shortness of breath or wheezing., Disp: 75 mL, Rfl: 2   albuterol (VENTOLIN HFA) 108 (90 Base) MCG/ACT inhaler, INHALE 1 TO 2 PUFFS BY MOUTH EVERY 6 HOURS AS NEEDED FOR WHEEZING OR SHORTNESS OF BREATH, Disp: 18 g, Rfl: 2   amiodarone (PACERONE) 200 MG tablet, Take 2 tablets (400 mg total) by mouth 2 (two) times daily for 14 days, THEN 2 tablets (400 mg total) daily for 14 days, THEN 1 tablet (200 mg total) daily., Disp: 150 tablet, Rfl: 3   aspirin EC 81 MG tablet, Take 1 tablet (81 mg total) by mouth daily. Swallow whole., Disp: 30 tablet, Rfl: 11   bismuth subsalicylate (PEPTO BISMOL) 262 MG/15ML suspension, Take 30 mLs by mouth every 6 (six) hours as needed for indigestion. (Patient not taking: Reported on 07/18/2022), Disp: , Rfl:    BREO ELLIPTA 100-25 MCG/ACT AEPB, Inhale 1 puff into the lungs daily., Disp: , Rfl:    carvedilol (COREG) 3.125 MG tablet, Take 1 tablet (3.125 mg total) by mouth 2 (two) times daily., Disp: 60 tablet, Rfl: 11   Cholecalciferol (  VITAMIN D3) 50 MCG (2000 UT) TABS, Take 1 tablet by mouth daily., Disp: , Rfl:    digoxin (LANOXIN) 0.125 MG tablet, TAKE 1 TABLET (0.125 MG TOTAL) BY MOUTH DAILY., Disp: 90 tablet, Rfl: 1   empagliflozin (JARDIANCE) 10 MG TABS tablet, Take 1 tablet (10 mg total) by mouth daily., Disp: 30 tablet, Rfl: 6   mexiletine (MEXITIL) 150 MG capsule, Take 1 capsule (150 mg total) by mouth 2 (two) times daily., Disp: 180 capsule, Rfl: 3   rosuvastatin (CRESTOR) 5 MG tablet, TAKE 1 TABLET (5 MG TOTAL) BY MOUTH  DAILY., Disp: 90 tablet, Rfl: 3   spironolactone (ALDACTONE) 25 MG tablet, Take 1 tablet (25 mg total) by mouth daily., Disp: 90 tablet, Rfl: 3   tamsulosin (FLOMAX) 0.4 MG CAPS capsule, TAKE ONE CAPSULE BY MOUTH ONCE DAILY AFTER SUPPER, Disp: 30 capsule, Rfl: 2   torsemide (DEMADEX) 20 MG tablet, Take 1 tablet (20 mg total) by mouth daily as needed (extra fluid)., Disp: 30 tablet, Rfl: 2   umeclidinium bromide (INCRUSE ELLIPTA) 62.5 MCG/ACT AEPB, Inhale 1 puff into the lungs daily., Disp: 90 each, Rfl: 3 No Known Allergies   Social History   Socioeconomic History   Marital status: Married    Spouse name: Not on file   Number of children: Not on file   Years of education: Not on file   Highest education level: Not on file  Occupational History   Occupation: Engineer, manufacturing systems    Comment: has not been able to work steadily for the last year or two  Tobacco Use   Smoking status: Every Day    Packs/day: 0.10    Years: 48.00    Total pack years: 4.80    Types: Cigarettes   Smokeless tobacco: Never   Tobacco comments:    cutting back 2  per day.  No cigs  Vaping Use   Vaping Use: Never used  Substance and Sexual Activity   Alcohol use: Yes    Alcohol/week: 1.0 standard drink of alcohol    Types: 1 Cans of beer per week    Comment: occasionally   Drug use: Yes    Types: Marijuana    Comment: daily 4 times last 2 weeks ago as of 05/31/2019   Sexual activity: Yes    Birth control/protection: None  Other Topics Concern   Not on file  Social History Narrative   Not on file   Social Determinants of Health   Financial Resource Strain: Low Risk  (06/17/2022)   Overall Financial Resource Strain (CARDIA)    Difficulty of Paying Living Expenses: Not hard at all  Food Insecurity: No Food Insecurity (06/17/2022)   Hunger Vital Sign    Worried About Running Out of Food in the Last Year: Never true    Ran Out of Food in the Last Year: Never true  Recent Concern: Food Insecurity - Food  Insecurity Present (06/08/2022)   Hunger Vital Sign    Worried About Running Out of Food in the Last Year: Sometimes true    Ran Out of Food in the Last Year: Sometimes true  Transportation Needs: No Transportation Needs (06/17/2022)   PRAPARE - Hydrologist (Medical): No    Lack of Transportation (Non-Medical): No  Recent Concern: Transportation Needs - Unmet Transportation Needs (06/08/2022)   PRAPARE - Hydrologist (Medical): Yes    Lack of Transportation (Non-Medical): No  Physical Activity: Inactive (06/17/2022)  Exercise Vital Sign    Days of Exercise per Week: 0 days    Minutes of Exercise per Session: 0 min  Stress: No Stress Concern Present (06/17/2022)   [place]    Feeling of Stress : Not at all  Social Connections: Moderately Isolated (06/17/2022)   Social Connection and Isolation Panel [NHANES]    Frequency of Communication with Friends and Family: More than three times a week    Frequency of Social Gatherings with Friends and Family: More than three times a week    Attends Religious Services: Never    Marine scientist or Organizations: No    Attends Archivist Meetings: Never    Marital Status: Married  Human resources officer Violence: Not At Risk (06/17/2022)   Humiliation, Afraid, Rape, and Kick questionnaire    Fear of Current or Ex-Partner: No    Emotionally Abused: No    Physically Abused: No    Sexually Abused: No    Physical Exam      Future Appointments  Date Time Provider [place]  09/03/2022  9:20 AM Shirley Friar, PA-C [facility]  10/17/2022  7:05 AM [facility] DEVICE REMOTES [facility]  10/21/2022  2:15 PM Deboraha Sprang, MD [facility]  01/16/2023  7:05 AM [facility] DEVICE REMOTES [facility]  04/17/2023  7:05 AM [facility] DEVICE REMOTES  [facility]  07/17/2023  7:05 AM [facility] DEVICE REMOTES [facility]  10/16/2023  7:05 AM [facility] DEVICE REMOTES [facility]  01/15/2024  7:05 AM [facility] DEVICE REMOTES [facility]     ACTION: Home visit completed

## 2022-08-02 ENCOUNTER — Other Ambulatory Visit: Payer: Self-pay | Admitting: Student

## 2022-08-02 DIAGNOSIS — R062 Wheezing: Secondary | ICD-10-CM

## 2022-08-02 DIAGNOSIS — J42 Unspecified chronic bronchitis: Secondary | ICD-10-CM

## 2022-08-02 MED ORDER — BREO ELLIPTA 100-25 MCG/ACT IN AEPB: 1.0000 | INHALATION_SPRAY | Freq: Every day | RESPIRATORY_TRACT | 11 refills | Status: AC

## 2022-08-02 MED ORDER — ALBUTEROL SULFATE HFA 108 (90 BASE) MCG/ACT IN AERS: INHALATION_SPRAY | RESPIRATORY_TRACT | 2 refills | Status: DC

## 2022-08-02 MED ORDER — ALBUTEROL SULFATE (2.5 MG/3ML) 0.083% IN NEBU: 2.5000 mg | INHALATION_SOLUTION | Freq: Four times a day (QID) | RESPIRATORY_TRACT | 2 refills | Status: DC | PRN

## 2022-08-15 ENCOUNTER — Telehealth (HOSPITAL_COMMUNITY): Payer: Self-pay

## 2022-08-15 NOTE — Telephone Encounter (Signed)
Attempted to reach Mr. Gauthreaux for paramedicine follow up- no answer. I left a message and will continue to reach out.   [name], [place] 08/15/2022

## 2022-08-20 ENCOUNTER — Telehealth (HOSPITAL_COMMUNITY): Payer: Self-pay

## 2022-08-20 NOTE — Telephone Encounter (Signed)
Attempted to reach Edward Mullen for paramedicine visit with no answer. I will continue to follow up.   [name], [place] 08/20/2022

## 2022-08-21 ENCOUNTER — Telehealth (HOSPITAL_COMMUNITY): Payer: Self-pay

## 2022-08-21 NOTE — Telephone Encounter (Signed)
Attempted to reach [facility] for paramedicine follow up. No answer and not able to leave a message. I will try to reach him again next week.   [name], [place] 08/21/2022

## 2022-08-27 ENCOUNTER — Telehealth (HOSPITAL_COMMUNITY): Payer: Self-pay

## 2022-08-27 NOTE — Telephone Encounter (Signed)
Spoke to Edward Mullen asking if I could come out for a paramedicine visit and he declined, asking if I could come out next week. I will plan to follow up next week. Call complete.   Edward Mullen, [place] 08/27/2022

## 2022-09-02 NOTE — Progress Notes (Deleted)
Electrophysiology Office Note Date: 09/02/2022  ID:  Joshua, Mockler September 03, 1955, MRN [id number]  PCP: Romana Juniper, MD Primary Cardiologist: None Electrophysiologist: Virl Axe, MD   CC: Routine ICD follow-up  EEAN MCCARRICK is a 67 y.o. male seen today for Virl Axe, MD for routine electrophysiology followup.   Pt noted to have VT treated with ATP last month. Dr. Caryl Comes examined in Device room and increased amiodarone and added mexitil. Pt was not aware VT events.   Since last being seen in our clinic the patient reports doing ***.  he denies chest pain, palpitations, dyspnea, PND, orthopnea, nausea, vomiting, dizziness, syncope, edema, weight gain, or early satiety.   Device History: St. Jude BiV ICD implanted 07/17/2022 for CHF History of appropriate therapy: Yes History of AAD therapy: Yes; currently on amio and mexitil    Past Medical History:  Diagnosis Date   Arthritis    "feels like it in my legs" (09/20/2014)   CHF (congestive heart failure) (HCC)    Dysrhythmia    Hypertension    NSVT (nonsustained ventricular tachycardia) ([place]) 09/06/2016   pre diabetes    patient denies    Current Outpatient Medications  Medication Instructions   albuterol (PROVENTIL) 2.5 mg, Nebulization, Every 6 hours PRN   albuterol (VENTOLIN HFA) 108 (90 Base) MCG/ACT inhaler INHALE 1 TO 2 PUFFS BY MOUTH EVERY 6 HOURS AS NEEDED FOR WHEEZING OR SHORTNESS OF BREATH   amiodarone (PACERONE) 200 MG tablet Take 2 tablets (400 mg total) by mouth 2 (two) times daily for 14 days, THEN 2 tablets (400 mg total) daily for 14 days, THEN 1 tablet (200 mg total) daily.   aspirin EC 81 mg, Oral, Daily, Swallow whole.   bismuth subsalicylate (PEPTO BISMOL) 262 MG/15ML suspension 30 mLs, Every 6 hours PRN   BREO ELLIPTA 100-25 MCG/ACT AEPB 1 puff, Inhalation, Daily   carvedilol (COREG) 3.125 mg, Oral, 2 times daily   Cholecalciferol (VITAMIN D3) 50 MCG (2000 UT) TABS 1 tablet, Oral,  Daily   digoxin (LANOXIN) 0.125 mg, Oral, Daily   empagliflozin (JARDIANCE) 10 mg, Oral, Daily   mexiletine (MEXITIL) 150 mg, Oral, 2 times daily   rosuvastatin (CRESTOR) 5 MG tablet TAKE 1 TABLET (5 MG TOTAL) BY MOUTH DAILY.   spironolactone (ALDACTONE) 25 mg, Oral, Daily   tamsulosin (FLOMAX) 0.4 MG CAPS capsule TAKE ONE CAPSULE BY MOUTH ONCE DAILY AFTER SUPPER   torsemide (DEMADEX) 20 mg, Oral, Daily PRN   umeclidinium bromide (INCRUSE ELLIPTA) 62.5 MCG/ACT AEPB 1 puff, Inhalation, Daily    Family History: Family History  Problem Relation Age of Onset   Stroke Mother 69   Heart disease Father 79   Colon cancer Neg Hx    Colon polyps Neg Hx    Esophageal cancer Neg Hx    Stomach cancer Neg Hx    Rectal cancer Neg Hx     Physical Exam: There were no vitals filed for this visit.   GEN- NAD. A&O x 3. Normal affect. HEENT: Normocephalic, atraumatic Lungs- CTAB, Normal effort.  Heart- {EPRHYTHM:28826} rate and rhythm. No M/G/R.  Extremities- {EDEMA LEVEL:28147::"No"} peripheral edema. no clubbing or cyanosis Skin- warm and dry, no rash or lesion; ICD pocket well healed  ICD interrogation- reviewed in detail today,  See PACEART report  {EKGtoday:28818::"EKG is not ordered today"}  Other studies Reviewed: Additional studies/ records that were reviewed today include: Previous EP office notes.   {Select studies to display:26339}   Assessment and Plan:  1.  Chronic systolic dysfunction s/p St. Jude CRT-D  euvolemic today Stable on an appropriate medical regimen Normal ICD function See Pace Art report No changes today  2. VT *** since last visit.  Labs today.  Continue amiodarone taper to eventual chronic dosing of 200 mg daily.    Current medicines are reviewed at length with the patient today.   =  Labs/ tests ordered today include: *** No orders of the defined types were placed in this encounter.    Disposition:   Follow up with {EPPROVIDERS:28135}  {EPFOLLOW UP:28173}   Jacalyn Lefevre, PA-C  09/02/2022 10:48 AM  [facility] [address] [place] [address] [phone] (office) [phone] (fax)

## 2022-09-03 ENCOUNTER — Ambulatory Visit: Payer: Medicare HMO | Attending: Student | Admitting: Student

## 2022-09-03 DIAGNOSIS — I428 Other cardiomyopathies: Secondary | ICD-10-CM

## 2022-09-03 DIAGNOSIS — I4729 Other ventricular tachycardia: Secondary | ICD-10-CM

## 2022-09-03 DIAGNOSIS — I5022 Chronic systolic (congestive) heart failure: Secondary | ICD-10-CM

## 2022-09-11 ENCOUNTER — Telehealth (HOSPITAL_COMMUNITY): Payer: Self-pay

## 2022-09-11 NOTE — Telephone Encounter (Signed)
Attempted to reach Edward Mullen to discuss paramedicine program but no answer. Tammy Sours made aware and she will attempt and if no success he will be discharged.   [name], [place] 09/11/2022

## 2022-09-18 ENCOUNTER — Telehealth (HOSPITAL_COMMUNITY): Payer: Self-pay

## 2022-09-18 NOTE — Telephone Encounter (Signed)
Spoke with Tammy Sours, LCSW and she agreed for Edward Mullen to be d/c'ed from Paramedicine due to failed attempts at reaching him.   [name], [place] 09/18/2022

## 2022-10-01 ENCOUNTER — Other Ambulatory Visit: Payer: Self-pay | Admitting: Internal Medicine

## 2022-10-01 DIAGNOSIS — I5022 Chronic systolic (congestive) heart failure: Secondary | ICD-10-CM

## 2022-10-07 ENCOUNTER — Telehealth: Payer: Self-pay

## 2022-10-07 NOTE — Telephone Encounter (Signed)
**Note De-Identified Edward Mullen Obfuscation** I called [facility] and did a [employer] PA over the phone with [name]. Per [name], they will fax [place] a determination letter within 24-72 hrs.  We can also call them at [phone] to get their determination as well.  Reference #: [id number]

## 2022-10-14 NOTE — Telephone Encounter (Signed)
**Note De-Identified Edward Mullen Obfuscation** Letter received from [facility] Edward Mullen fax stating that they have approved [employer] for coverage until 08/12/2023.   The pt (Edward Mullen letter from [facility]) and Mullen, Edward Mullen (Ph: [phone]) is aware of this approval

## 2022-10-17 ENCOUNTER — Ambulatory Visit: Payer: Medicare HMO

## 2022-10-17 DIAGNOSIS — I428 Other cardiomyopathies: Secondary | ICD-10-CM

## 2022-10-17 LAB — CUP PACEART REMOTE DEVICE CHECK
Battery Remaining Longevity: 68 mo
Battery Remaining Percentage: 93 %
Battery Voltage: 3.17 V
Brady Statistic AP VP Percent: 42 %
Brady Statistic AP VS Percent: 1 %
Brady Statistic AS VP Percent: 51 %
Brady Statistic AS VS Percent: 2.1 %
Brady Statistic RA Percent Paced: 41 %
Date Time Interrogation Session: 20240307020016
HighPow Impedance: 70 Ohm
HighPow Impedance: 70 Ohm
Implantable Lead Connection Status: 753985
Implantable Lead Connection Status: 753985
Implantable Lead Connection Status: 753985
Implantable Lead Implant Date: 20231206
Implantable Lead Implant Date: 20231206
Implantable Lead Implant Date: 20231206
Implantable Lead Location: 753858
Implantable Lead Location: 753859
Implantable Lead Location: 753860
Implantable Lead Model: 7122
Implantable Pulse Generator Implant Date: 20231206
Lead Channel Impedance Value: 460 Ohm
Lead Channel Impedance Value: 550 Ohm
Lead Channel Impedance Value: 580 Ohm
Lead Channel Pacing Threshold Amplitude: 0.5 V
Lead Channel Pacing Threshold Amplitude: 0.625 V
Lead Channel Pacing Threshold Amplitude: 0.75 V
Lead Channel Pacing Threshold Pulse Width: 0.5 ms
Lead Channel Pacing Threshold Pulse Width: 0.5 ms
Lead Channel Pacing Threshold Pulse Width: 1 ms
Lead Channel Sensing Intrinsic Amplitude: 3.2 mV
Lead Channel Sensing Intrinsic Amplitude: 7.8 mV
Lead Channel Setting Pacing Amplitude: 1.5 V
Lead Channel Setting Pacing Amplitude: 2 V
Lead Channel Setting Pacing Amplitude: 3.5 V
Lead Channel Setting Pacing Pulse Width: 0.5 ms
Lead Channel Setting Pacing Pulse Width: 1 ms
Lead Channel Setting Sensing Sensitivity: 0.5 mV
Pulse Gen Serial Number: 5556126
Zone Setting Status: 755011

## 2022-10-21 ENCOUNTER — Ambulatory Visit: Payer: Medicare HMO | Attending: Internal Medicine | Admitting: Internal Medicine

## 2022-10-21 DIAGNOSIS — I447 Left bundle-branch block, unspecified: Secondary | ICD-10-CM | POA: Insufficient documentation

## 2022-10-21 DIAGNOSIS — I493 Ventricular premature depolarization: Secondary | ICD-10-CM | POA: Insufficient documentation

## 2022-10-28 ENCOUNTER — Other Ambulatory Visit: Payer: Self-pay | Admitting: Student

## 2022-10-28 DIAGNOSIS — R062 Wheezing: Secondary | ICD-10-CM

## 2022-11-17 ENCOUNTER — Encounter (HOSPITAL_COMMUNITY): Payer: Self-pay | Admitting: Emergency Medicine

## 2022-11-17 ENCOUNTER — Emergency Department (HOSPITAL_COMMUNITY): Payer: Medicare HMO

## 2022-11-17 ENCOUNTER — Inpatient Hospital Stay (HOSPITAL_COMMUNITY)
Admission: EM | Admit: 2022-11-17 | Discharge: 2022-11-26 | DRG: 871 | Disposition: A | Discharge status: Home Health | Payer: Medicare HMO | Attending: Internal Medicine | Admitting: Internal Medicine

## 2022-11-17 ENCOUNTER — Other Ambulatory Visit: Payer: Self-pay

## 2022-11-17 DIAGNOSIS — R6521 Severe sepsis with septic shock: Secondary | ICD-10-CM | POA: Diagnosis present

## 2022-11-17 DIAGNOSIS — E440 Moderate protein-calorie malnutrition: Secondary | ICD-10-CM | POA: Insufficient documentation

## 2022-11-17 DIAGNOSIS — I5022 Chronic systolic (congestive) heart failure: Secondary | ICD-10-CM | POA: Diagnosis present

## 2022-11-17 DIAGNOSIS — J9601 Acute respiratory failure with hypoxia: Secondary | ICD-10-CM | POA: Diagnosis present

## 2022-11-17 DIAGNOSIS — I472 Ventricular tachycardia, unspecified: Secondary | ICD-10-CM | POA: Diagnosis present

## 2022-11-17 DIAGNOSIS — Z7951 Long term (current) use of inhaled steroids: Secondary | ICD-10-CM

## 2022-11-17 DIAGNOSIS — B9789 Other viral agents as the cause of diseases classified elsewhere: Secondary | ICD-10-CM | POA: Diagnosis present

## 2022-11-17 DIAGNOSIS — N1831 Chronic kidney disease, stage 3a: Secondary | ICD-10-CM | POA: Diagnosis present

## 2022-11-17 DIAGNOSIS — Z7982 Long term (current) use of aspirin: Secondary | ICD-10-CM

## 2022-11-17 DIAGNOSIS — E1122 Type 2 diabetes mellitus with diabetic chronic kidney disease: Secondary | ICD-10-CM | POA: Diagnosis present

## 2022-11-17 DIAGNOSIS — I130 Hypertensive heart and chronic kidney disease with heart failure and stage 1 through stage 4 chronic kidney disease, or unspecified chronic kidney disease: Secondary | ICD-10-CM | POA: Diagnosis present

## 2022-11-17 DIAGNOSIS — I5023 Acute on chronic systolic (congestive) heart failure: Secondary | ICD-10-CM

## 2022-11-17 DIAGNOSIS — Z9911 Dependence on respirator [ventilator] status: Secondary | ICD-10-CM

## 2022-11-17 DIAGNOSIS — E875 Hyperkalemia: Secondary | ICD-10-CM

## 2022-11-17 DIAGNOSIS — B341 Enterovirus infection, unspecified: Secondary | ICD-10-CM | POA: Diagnosis present

## 2022-11-17 DIAGNOSIS — R931 Abnormal findings on diagnostic imaging of heart and coronary circulation: Secondary | ICD-10-CM

## 2022-11-17 DIAGNOSIS — Z8249 Family history of ischemic heart disease and other diseases of the circulatory system: Secondary | ICD-10-CM

## 2022-11-17 DIAGNOSIS — J690 Pneumonitis due to inhalation of food and vomit: Secondary | ICD-10-CM | POA: Diagnosis present

## 2022-11-17 DIAGNOSIS — F17210 Nicotine dependence, cigarettes, uncomplicated: Secondary | ICD-10-CM | POA: Diagnosis present

## 2022-11-17 DIAGNOSIS — R570 Cardiogenic shock: Secondary | ICD-10-CM

## 2022-11-17 DIAGNOSIS — E872 Acidosis, unspecified: Secondary | ICD-10-CM | POA: Diagnosis present

## 2022-11-17 DIAGNOSIS — Z79899 Other long term (current) drug therapy: Secondary | ICD-10-CM

## 2022-11-17 DIAGNOSIS — R569 Unspecified convulsions: Secondary | ICD-10-CM | POA: Diagnosis not present

## 2022-11-17 DIAGNOSIS — Z91199 Patient's noncompliance with other medical treatment and regimen due to unspecified reason: Secondary | ICD-10-CM

## 2022-11-17 DIAGNOSIS — I5043 Acute on chronic combined systolic (congestive) and diastolic (congestive) heart failure: Secondary | ICD-10-CM | POA: Diagnosis present

## 2022-11-17 DIAGNOSIS — G928 Other toxic encephalopathy: Secondary | ICD-10-CM | POA: Diagnosis present

## 2022-11-17 DIAGNOSIS — A419 Sepsis, unspecified organism: Secondary | ICD-10-CM | POA: Diagnosis present

## 2022-11-17 DIAGNOSIS — D696 Thrombocytopenia, unspecified: Secondary | ICD-10-CM | POA: Diagnosis not present

## 2022-11-17 DIAGNOSIS — I468 Cardiac arrest due to other underlying condition: Secondary | ICD-10-CM | POA: Diagnosis present

## 2022-11-17 DIAGNOSIS — F1410 Cocaine abuse, uncomplicated: Secondary | ICD-10-CM | POA: Diagnosis present

## 2022-11-17 DIAGNOSIS — Z1152 Encounter for screening for COVID-19: Secondary | ICD-10-CM | POA: Diagnosis not present

## 2022-11-17 DIAGNOSIS — E11649 Type 2 diabetes mellitus with hypoglycemia without coma: Secondary | ICD-10-CM | POA: Diagnosis not present

## 2022-11-17 DIAGNOSIS — I2510 Atherosclerotic heart disease of native coronary artery without angina pectoris: Secondary | ICD-10-CM | POA: Diagnosis present

## 2022-11-17 DIAGNOSIS — Z91148 Patient's other noncompliance with medication regimen for other reason: Secondary | ICD-10-CM

## 2022-11-17 DIAGNOSIS — I4729 Other ventricular tachycardia: Secondary | ICD-10-CM | POA: Diagnosis present

## 2022-11-17 DIAGNOSIS — S2220XA Unspecified fracture of sternum, initial encounter for closed fracture: Secondary | ICD-10-CM | POA: Diagnosis present

## 2022-11-17 DIAGNOSIS — Z789 Other specified health status: Secondary | ICD-10-CM

## 2022-11-17 DIAGNOSIS — R4182 Altered mental status, unspecified: Secondary | ICD-10-CM | POA: Diagnosis not present

## 2022-11-17 DIAGNOSIS — J440 Chronic obstructive pulmonary disease with acute lower respiratory infection: Secondary | ICD-10-CM | POA: Diagnosis present

## 2022-11-17 DIAGNOSIS — F1210 Cannabis abuse, uncomplicated: Secondary | ICD-10-CM | POA: Diagnosis present

## 2022-11-17 DIAGNOSIS — S2243XA Multiple fractures of ribs, bilateral, initial encounter for closed fracture: Secondary | ICD-10-CM | POA: Diagnosis present

## 2022-11-17 DIAGNOSIS — N179 Acute kidney failure, unspecified: Secondary | ICD-10-CM | POA: Diagnosis present

## 2022-11-17 DIAGNOSIS — I469 Cardiac arrest, cause unspecified: Secondary | ICD-10-CM | POA: Diagnosis not present

## 2022-11-17 DIAGNOSIS — Z823 Family history of stroke: Secondary | ICD-10-CM

## 2022-11-17 DIAGNOSIS — I447 Left bundle-branch block, unspecified: Secondary | ICD-10-CM | POA: Diagnosis present

## 2022-11-17 DIAGNOSIS — I428 Other cardiomyopathies: Secondary | ICD-10-CM | POA: Diagnosis present

## 2022-11-17 DIAGNOSIS — Z87442 Personal history of urinary calculi: Secondary | ICD-10-CM

## 2022-11-17 DIAGNOSIS — Z5982 Transportation insecurity: Secondary | ICD-10-CM

## 2022-11-17 DIAGNOSIS — J9602 Acute respiratory failure with hypercapnia: Secondary | ICD-10-CM | POA: Diagnosis not present

## 2022-11-17 DIAGNOSIS — M1990 Unspecified osteoarthritis, unspecified site: Secondary | ICD-10-CM | POA: Diagnosis present

## 2022-11-17 DIAGNOSIS — F1910 Other psychoactive substance abuse, uncomplicated: Secondary | ICD-10-CM | POA: Diagnosis present

## 2022-11-17 DIAGNOSIS — J1289 Other viral pneumonia: Secondary | ICD-10-CM | POA: Diagnosis present

## 2022-11-17 DIAGNOSIS — N400 Enlarged prostate without lower urinary tract symptoms: Secondary | ICD-10-CM | POA: Diagnosis present

## 2022-11-17 DIAGNOSIS — Z95810 Presence of automatic (implantable) cardiac defibrillator: Secondary | ICD-10-CM

## 2022-11-17 DIAGNOSIS — Z7984 Long term (current) use of oral hypoglycemic drugs: Secondary | ICD-10-CM

## 2022-11-17 HISTORY — DX: Chronic systolic (congestive) heart failure: I50.22

## 2022-11-17 HISTORY — DX: Cocaine abuse, uncomplicated: F14.10

## 2022-11-17 HISTORY — DX: Chronic kidney disease, stage 3a: N18.31

## 2022-11-17 HISTORY — DX: Other cardiomyopathies: I42.8

## 2022-11-17 HISTORY — DX: Atherosclerotic heart disease of native coronary artery without angina pectoris: I25.10

## 2022-11-17 LAB — CBC WITH DIFFERENTIAL/PLATELET
Abs Immature Granulocytes: 0.36 10*3/uL — ABNORMAL HIGH (ref 0.00–0.07)
Basophils Absolute: 0.1 10*3/uL (ref 0.0–0.1)
Basophils Relative: 1 %
Eosinophils Absolute: 0.1 10*3/uL (ref 0.0–0.5)
Eosinophils Relative: 0 %
HCT: 53.6 % — ABNORMAL HIGH (ref 39.0–52.0)
Hemoglobin: 16 g/dL (ref 13.0–17.0)
Immature Granulocytes: 3 %
Lymphocytes Relative: 38 %
Lymphs Abs: 4.6 10*3/uL — ABNORMAL HIGH (ref 0.7–4.0)
MCH: 26.8 pg (ref 26.0–34.0)
MCHC: 29.9 g/dL — ABNORMAL LOW (ref 30.0–36.0)
MCV: 89.9 fL (ref 80.0–100.0)
Monocytes Absolute: 2.3 10*3/uL — ABNORMAL HIGH (ref 0.1–1.0)
Monocytes Relative: 19 %
Neutro Abs: 4.9 10*3/uL (ref 1.7–7.7)
Neutrophils Relative %: 39 %
Platelets: 153 10*3/uL (ref 150–400)
RBC: 5.96 MIL/uL — ABNORMAL HIGH (ref 4.22–5.81)
RDW: 18.4 % — ABNORMAL HIGH (ref 11.5–15.5)
WBC: 12.3 10*3/uL — ABNORMAL HIGH (ref 4.0–10.5)
nRBC: 0 % (ref 0.0–0.2)

## 2022-11-17 LAB — POCT I-STAT 7, (LYTES, BLD GAS, ICA,H+H)
Acid-base deficit: 2 mmol/L (ref 0.0–2.0)
Acid-base deficit: 2 mmol/L (ref 0.0–2.0)
Bicarbonate: 25 mmol/L (ref 20.0–28.0)
Bicarbonate: 22.5 mmol/L (ref 20.0–28.0)
Calcium, Ion: 1.23 mmol/L (ref 1.15–1.40)
Calcium, Ion: 1.17 mmol/L (ref 1.15–1.40)
HCT: 46 % (ref 39.0–52.0)
HCT: 46 % (ref 39.0–52.0)
Hemoglobin: 15.6 g/dL (ref 13.0–17.0)
Hemoglobin: 15.6 g/dL (ref 13.0–17.0)
O2 Saturation: 97 %
O2 Saturation: 99 %
Patient temperature: 37.6
Patient temperature: 37.7
Potassium: 4.2 mmol/L (ref 3.5–5.1)
Potassium: 3.9 mmol/L (ref 3.5–5.1)
Sodium: 140 mmol/L (ref 135–145)
Sodium: 140 mmol/L (ref 135–145)
TCO2: 26 mmol/L (ref 22–32)
TCO2: 24 mmol/L (ref 22–32)
pCO2 arterial: 52.6 mmHg — ABNORMAL HIGH (ref 32–48)
pCO2 arterial: 36.7 mmHg (ref 32–48)
pH, Arterial: 7.287 — ABNORMAL LOW (ref 7.35–7.45)
pH, Arterial: 7.399 (ref 7.35–7.45)
pO2, Arterial: 107 mmHg (ref 83–108)
pO2, Arterial: 133 mmHg — ABNORMAL HIGH (ref 83–108)

## 2022-11-17 LAB — I-STAT ARTERIAL BLOOD GAS, ED
Acid-base deficit: 12 mmol/L — ABNORMAL HIGH (ref 0.0–2.0)
Bicarbonate: 18 mmol/L — ABNORMAL LOW (ref 20.0–28.0)
Calcium, Ion: 1.19 mmol/L (ref 1.15–1.40)
HCT: 46 % (ref 39.0–52.0)
Hemoglobin: 15.6 g/dL (ref 13.0–17.0)
O2 Saturation: 99 %
Potassium: 4.8 mmol/L (ref 3.5–5.1)
Sodium: 137 mmol/L (ref 135–145)
TCO2: 20 mmol/L — ABNORMAL LOW (ref 22–32)
pCO2 arterial: 53.8 mmHg — ABNORMAL HIGH (ref 32–48)
pH, Arterial: 7.133 — CL (ref 7.35–7.45)
pO2, Arterial: 172 mmHg — ABNORMAL HIGH (ref 83–108)

## 2022-11-17 LAB — I-STAT CHEM 8, ED
BUN: 30 mg/dL — ABNORMAL HIGH (ref 8–23)
Calcium, Ion: 1.1 mmol/L — ABNORMAL LOW (ref 1.15–1.40)
Chloride: 105 mmol/L (ref 98–111)
Creatinine, Ser: 2 mg/dL — ABNORMAL HIGH (ref 0.61–1.24)
Glucose, Bld: 233 mg/dL — ABNORMAL HIGH (ref 70–99)
HCT: 55 % — ABNORMAL HIGH (ref 39.0–52.0)
Hemoglobin: 18.7 g/dL — ABNORMAL HIGH (ref 13.0–17.0)
Potassium: 6.1 mmol/L — ABNORMAL HIGH (ref 3.5–5.1)
Sodium: 138 mmol/L (ref 135–145)
TCO2: 22 mmol/L (ref 22–32)

## 2022-11-17 LAB — GLUCOSE, CAPILLARY
Glucose-Capillary: 116 mg/dL — ABNORMAL HIGH (ref 70–99)
Glucose-Capillary: 123 mg/dL — ABNORMAL HIGH (ref 70–99)
Glucose-Capillary: 132 mg/dL — ABNORMAL HIGH (ref 70–99)

## 2022-11-17 LAB — I-STAT VENOUS BLOOD GAS, ED
Acid-base deficit: 14 mmol/L — ABNORMAL HIGH (ref 0.0–2.0)
Bicarbonate: 18.6 mmol/L — ABNORMAL LOW (ref 20.0–28.0)
Calcium, Ion: 1.14 mmol/L — ABNORMAL LOW (ref 1.15–1.40)
HCT: 51 % (ref 39.0–52.0)
Hemoglobin: 17.3 g/dL — ABNORMAL HIGH (ref 13.0–17.0)
O2 Saturation: 95 %
Potassium: 4.9 mmol/L (ref 3.5–5.1)
Sodium: 138 mmol/L (ref 135–145)
TCO2: 21 mmol/L — ABNORMAL LOW (ref 22–32)
pCO2, Ven: 71.3 mmHg (ref 44–60)
pH, Ven: 7.024 — CL (ref 7.25–7.43)
pO2, Ven: 115 mmHg — ABNORMAL HIGH (ref 32–45)

## 2022-11-17 LAB — MRSA NEXT GEN BY PCR, NASAL: MRSA by PCR Next Gen: NOT DETECTED

## 2022-11-17 LAB — URINALYSIS, ROUTINE W REFLEX MICROSCOPIC
Bilirubin Urine: NEGATIVE
Glucose, UA: >=500 mg/dL — AB
Ketones, ur: NEGATIVE mg/dL
Leukocytes,Ua: NEGATIVE
Nitrite: NEGATIVE
Protein, ur: >=300 mg/dL — AB
RBC / HPF: >50 RBC/hpf (ref 0–5)
Specific Gravity, Urine: 1.02 (ref 1.005–1.030)
pH: 6 (ref 5.0–8.0)

## 2022-11-17 LAB — RAPID URINE DRUG SCREEN, HOSP PERFORMED
Amphetamines: NOT DETECTED
Barbiturates: NOT DETECTED
Benzodiazepines: POSITIVE — AB
Cocaine: POSITIVE — AB
Opiates: NOT DETECTED
Tetrahydrocannabinol: POSITIVE — AB

## 2022-11-17 LAB — LACTIC ACID, PLASMA
Lactic Acid, Venous: >9 mmol/L (ref 0.5–1.9)
Lactic Acid, Venous: 7 mmol/L (ref 0.5–1.9)

## 2022-11-17 LAB — TROPONIN I (HIGH SENSITIVITY)
Troponin I (High Sensitivity): 34 ng/L — ABNORMAL HIGH (ref <18)
Troponin I (High Sensitivity): 65 ng/L — ABNORMAL HIGH (ref <18)

## 2022-11-17 LAB — CREATININE, SERUM
Creatinine, Ser: 2.1 mg/dL — ABNORMAL HIGH (ref 0.61–1.24)
GFR, Estimated: 34 mL/min — ABNORMAL LOW (ref >60)

## 2022-11-17 LAB — CBG MONITORING, ED: Glucose-Capillary: 102 mg/dL — ABNORMAL HIGH (ref 70–99)

## 2022-11-17 MED ORDER — HEPARIN SODIUM (PORCINE) 5000 UNIT/ML IJ SOLN
5000.0000 [IU] | Freq: Three times a day (TID) | INTRAMUSCULAR | Status: DC
  Administered 2022-11-17 – 2022-11-26 (×27): 5000 [IU] via SUBCUTANEOUS
  Filled 2022-11-17 (×27): qty 1

## 2022-11-17 MED ORDER — SODIUM CHLORIDE 0.9 % IV SOLN: 3.0000 g | Freq: Three times a day (TID) | INTRAVENOUS | Status: DC

## 2022-11-17 MED ORDER — ORAL CARE MOUTH RINSE: 15.0000 mL | OROMUCOSAL | Status: DC | PRN

## 2022-11-17 MED ORDER — ORAL CARE MOUTH RINSE
15.0000 mL | OROMUCOSAL | Status: DC
  Administered 2022-11-18 – 2022-11-20 (×30): 15 mL via OROMUCOSAL

## 2022-11-17 MED ORDER — REVEFENACIN 175 MCG/3ML IN SOLN
175.0000 ug | Freq: Every day | RESPIRATORY_TRACT | Status: DC
  Administered 2022-11-18 – 2022-11-26 (×9): 175 ug via RESPIRATORY_TRACT
  Filled 2022-11-17 (×9): qty 3

## 2022-11-17 MED ORDER — EPINEPHRINE HCL 5 MG/250ML IV SOLN IN NS
0.5000 ug/min | INTRAVENOUS | Status: DC
  Administered 2022-11-17: 10 ug/min via INTRAVENOUS
  Filled 2022-11-17: qty 250

## 2022-11-17 MED ORDER — PHENYLEPHRINE 80 MCG/ML (10ML) SYRINGE FOR IV PUSH (FOR BLOOD PRESSURE SUPPORT)
80.0000 ug | PREFILLED_SYRINGE | Freq: Once | INTRAVENOUS | Status: AC | PRN
  Administered 2022-11-17: 80 ug via INTRAVENOUS

## 2022-11-17 MED ORDER — PROPOFOL 1000 MG/100ML IV EMUL
5.0000 ug/kg/min | INTRAVENOUS | Status: DC
  Administered 2022-11-17: 5 ug/kg/min via INTRAVENOUS
  Administered 2022-11-17 – 2022-11-19 (×7): 40 ug/kg/min via INTRAVENOUS
  Administered 2022-11-19: 35 ug/kg/min via INTRAVENOUS
  Administered 2022-11-19: 30 ug/kg/min via INTRAVENOUS
  Administered 2022-11-19 – 2022-11-20 (×5): 40 ug/kg/min via INTRAVENOUS
  Filled 2022-11-17 (×16): qty 100

## 2022-11-17 MED ORDER — CALCIUM GLUCONATE-NACL 1-0.675 GM/50ML-% IV SOLN
1.0000 g | Freq: Once | INTRAVENOUS | Status: AC
  Administered 2022-11-17: 1000 mg via INTRAVENOUS
  Filled 2022-11-17: qty 50

## 2022-11-17 MED ORDER — BUSPIRONE HCL 10 MG PO TABS: 30.0000 mg | ORAL_TABLET | Freq: Three times a day (TID) | ORAL | Status: AC | PRN

## 2022-11-17 MED ORDER — ACETAMINOPHEN 650 MG RE SUPP: 650.0000 mg | RECTAL | Status: AC

## 2022-11-17 MED ORDER — INSULIN ASPART 100 UNIT/ML IV SOLN
10.0000 [IU] | Freq: Once | INTRAVENOUS | Status: AC
  Administered 2022-11-17: 10 [IU] via INTRAVENOUS

## 2022-11-17 MED ORDER — AMIODARONE HCL 200 MG PO TABS
200.0000 mg | ORAL_TABLET | Freq: Every day | ORAL | Status: DC
  Administered 2022-11-17 – 2022-11-20 (×4): 200 mg
  Filled 2022-11-17 (×5): qty 1

## 2022-11-17 MED ORDER — SODIUM BICARBONATE 8.4 % IV SOLN
50.0000 meq | Freq: Once | INTRAVENOUS | Status: AC
  Filled 2022-11-17: qty 50

## 2022-11-17 MED ORDER — SODIUM BICARBONATE 8.4 % IV SOLN
INTRAVENOUS | Status: AC
  Administered 2022-11-17: 50 meq via INTRAVENOUS
  Filled 2022-11-17: qty 50

## 2022-11-17 MED ORDER — SODIUM CHLORIDE 0.9 % IV SOLN
INTRAVENOUS | Status: AC | PRN
  Administered 2022-11-17: 1000 mL via INTRAVENOUS

## 2022-11-17 MED ORDER — SODIUM CHLORIDE 0.9 % IV SOLN
3.0000 g | Freq: Three times a day (TID) | INTRAVENOUS | Status: DC
  Administered 2022-11-17 – 2022-11-21 (×11): 3 g via INTRAVENOUS
  Filled 2022-11-17 (×11): qty 8

## 2022-11-17 MED ORDER — NALOXONE HCL 2 MG/2ML IJ SOSY
PREFILLED_SYRINGE | INTRAMUSCULAR | Status: DC | PRN
  Administered 2022-11-17: 2 mg via INTRAVENOUS

## 2022-11-17 MED ORDER — PROPOFOL 1000 MG/100ML IV EMUL
INTRAVENOUS | Status: AC
  Administered 2022-11-17: 20 ug
  Filled 2022-11-17: qty 100

## 2022-11-17 MED ORDER — ARFORMOTEROL TARTRATE 15 MCG/2ML IN NEBU
15.0000 ug | INHALATION_SOLUTION | Freq: Two times a day (BID) | RESPIRATORY_TRACT | Status: DC
  Administered 2022-11-17 – 2022-11-26 (×17): 15 ug via RESPIRATORY_TRACT
  Filled 2022-11-17 (×18): qty 2

## 2022-11-17 MED ORDER — SODIUM BICARBONATE 8.4 % IV SOLN
50.0000 meq | Freq: Once | INTRAVENOUS | Status: AC
  Administered 2022-11-17: 50 meq via INTRAVENOUS

## 2022-11-17 MED ORDER — NOREPINEPHRINE 4 MG/250ML-% IV SOLN
0.0000 ug/min | INTRAVENOUS | Status: DC
  Administered 2022-11-17: 5 ug/min via INTRAVENOUS
  Administered 2022-11-18 (×2): 12 ug/min via INTRAVENOUS
  Administered 2022-11-18: 8 ug/min via INTRAVENOUS
  Administered 2022-11-18: 12 ug/min via INTRAVENOUS
  Administered 2022-11-18: 10 ug/min via INTRAVENOUS
  Administered 2022-11-19: 4 ug/min via INTRAVENOUS
  Administered 2022-11-19: 8 ug/min via INTRAVENOUS
  Administered 2022-11-20: 9 ug/min via INTRAVENOUS
  Filled 2022-11-17 (×8): qty 250

## 2022-11-17 MED ORDER — ACETAMINOPHEN 325 MG PO TABS: 650.0000 mg | ORAL_TABLET | ORAL | Status: AC

## 2022-11-17 MED ORDER — ASPIRIN 325 MG PO TABS
325.0000 mg | ORAL_TABLET | Freq: Every day | ORAL | Status: DC
  Administered 2022-11-17 – 2022-11-20 (×4): 325 mg
  Filled 2022-11-17 (×5): qty 1

## 2022-11-17 MED ORDER — SODIUM CHLORIDE 0.9 % IV SOLN: INTRAVENOUS | Status: DC

## 2022-11-17 MED ORDER — POLYETHYLENE GLYCOL 3350 17 G PO PACK
17.0000 g | PACK | Freq: Every day | ORAL | Status: DC
  Administered 2022-11-17 – 2022-11-18 (×2): 17 g
  Filled 2022-11-17 (×2): qty 1

## 2022-11-17 MED ORDER — ETOMIDATE 2 MG/ML IV SOLN
INTRAVENOUS | Status: DC | PRN
  Administered 2022-11-17: 15 mg via INTRAVENOUS

## 2022-11-17 MED ORDER — FAMOTIDINE 20 MG PO TABS
20.0000 mg | ORAL_TABLET | Freq: Two times a day (BID) | ORAL | Status: DC
  Administered 2022-11-18 – 2022-11-20 (×5): 20 mg
  Filled 2022-11-17 (×5): qty 1

## 2022-11-17 MED ORDER — PHENYLEPHRINE 80 MCG/ML (10ML) SYRINGE FOR IV PUSH (FOR BLOOD PRESSURE SUPPORT)
160.0000 ug | PREFILLED_SYRINGE | Freq: Once | INTRAVENOUS | Status: AC | PRN
  Administered 2022-11-17: 160 ug via INTRAVENOUS

## 2022-11-17 MED ORDER — FENTANYL CITRATE PF 50 MCG/ML IJ SOSY: 25.0000 ug | PREFILLED_SYRINGE | INTRAMUSCULAR | Status: DC | PRN

## 2022-11-17 MED ORDER — SUCCINYLCHOLINE CHLORIDE 20 MG/ML IJ SOLN
INTRAMUSCULAR | Status: DC | PRN
  Administered 2022-11-17: 120 mg via INTRAVENOUS

## 2022-11-17 MED ORDER — BUDESONIDE 0.25 MG/2ML IN SUSP
0.2500 mg | Freq: Two times a day (BID) | RESPIRATORY_TRACT | Status: DC
  Administered 2022-11-17 – 2022-11-21 (×8): 0.25 mg via RESPIRATORY_TRACT
  Filled 2022-11-17 (×8): qty 2

## 2022-11-17 MED ORDER — ACETAMINOPHEN 160 MG/5ML PO SOLN
650.0000 mg | ORAL | Status: AC
  Administered 2022-11-17 – 2022-11-19 (×10): 650 mg
  Filled 2022-11-17 (×9): qty 20.3

## 2022-11-17 MED ORDER — NOREPINEPHRINE 4 MG/250ML-% IV SOLN
INTRAVENOUS | Status: AC | PRN
  Administered 2022-11-17: 2 ug/min via INTRAVENOUS

## 2022-11-17 MED ORDER — DOCUSATE SODIUM 50 MG/5ML PO LIQD
100.0000 mg | Freq: Two times a day (BID) | ORAL | Status: DC
  Administered 2022-11-17 – 2022-11-18 (×3): 100 mg
  Filled 2022-11-17 (×3): qty 10

## 2022-11-17 MED ORDER — PANTOPRAZOLE SODIUM 40 MG IV SOLR
40.0000 mg | Freq: Every day | INTRAVENOUS | Status: DC
  Administered 2022-11-17: 40 mg via INTRAVENOUS
  Filled 2022-11-17: qty 10

## 2022-11-17 MED ORDER — DEXTROSE 50 % IV SOLN
50.0000 mL | Freq: Once | INTRAVENOUS | Status: AC
  Administered 2022-11-17: 50 mL via INTRAVENOUS
  Filled 2022-11-17: qty 50

## 2022-11-17 MED ORDER — FENTANYL 2500MCG IN NS 250ML (10MCG/ML) PREMIX INFUSION
25.0000 ug/h | INTRAVENOUS | Status: DC
  Administered 2022-11-17: 100 ug/h via INTRAVENOUS
  Administered 2022-11-18: 200 ug/h via INTRAVENOUS
  Administered 2022-11-18 – 2022-11-20 (×3): 150 ug/h via INTRAVENOUS
  Filled 2022-11-17 (×5): qty 250

## 2022-11-17 MED ORDER — ONDANSETRON HCL 4 MG/2ML IJ SOLN: 4.0000 mg | Freq: Four times a day (QID) | INTRAMUSCULAR | Status: DC | PRN

## 2022-11-17 MED ORDER — IOHEXOL 350 MG/ML SOLN
75.0000 mL | Freq: Once | INTRAVENOUS | Status: AC | PRN
  Administered 2022-11-17: 75 mL via INTRAVENOUS

## 2022-11-17 MED ORDER — FENTANYL CITRATE PF 50 MCG/ML IJ SOSY
25.0000 ug | PREFILLED_SYRINGE | INTRAMUSCULAR | Status: DC | PRN
  Administered 2022-11-17: 50 ug via INTRAVENOUS
  Filled 2022-11-17: qty 1

## 2022-11-17 MED ORDER — SODIUM CHLORIDE 0.9 % IV SOLN: 3.0000 g | Freq: Two times a day (BID) | INTRAVENOUS | Status: DC

## 2022-11-17 MED ORDER — EPINEPHRINE HCL 5 MG/250ML IV SOLN IN NS: 5.0000 ug/min | INTRAVENOUS | Status: DC

## 2022-11-17 NOTE — ED Notes (Signed)
X-ray at bedside

## 2022-11-17 NOTE — Care Management Note (Signed)
Patient being moved to ICU.  Will perform EEG once move is complete.

## 2022-11-17 NOTE — Progress Notes (Signed)
   Radiology review   All reviewed below.  No change in plan . Other than get PSA and at some point depending on course MRI     SIGNATURE    Dr. Kalman Shan, M.D., F.C.C.P,  Pulmonary and Critical Care Medicine Staff Physician, [facility] Center Director - Interstitial Lung Disease  Program  Medical Director - [facility] Long ICU Pulmonary Fibrosis [facility] at [facility] [place], [place], [address]   Pager: [phone], If no answer  -> Check AMION or Try [phone] Telephone (clinical office): [phone] Telephone (research): [phone]  6:09 PM 11/17/2022    CT CHEST ABDOMEN PELVIS W CONTRAST  IMPRESSION: 1. Extensive patchy and nodular airspace opacities in both lungs as well as extensive ground-glass interstitial opacities. This could be due to pulmonary edema, pneumonia and/or pulmonary contusions. 2. Minimal bilateral pleural fluid. 3. Progressive cardiomegaly. 4. Multiple bilateral anterior rib fractures without significant displacement. 5. Fracture of the anterior cortex of the distal sternum. 6. No acute abnormality in the abdomen or pelvis. 7. 3 mm nonobstructing right renal calculus. 8. Markedly enlarged, heterogeneous prostate gland protruding into the bladder base. 9. Mild colonic diverticulosis. 10. Calcific coronary artery and aortic atherosclerosis. 11. 1.2 cm heterogeneous low and medium density right adrenal nodule without significant change since 06/11/2022 previously shown to represent a benign adenoma. This does not need imaging follow-up. Aortic Atherosclerosis (ICD10-I70.0). Electronically Signed   By: Beckie Salts M.D.   On: 11/17/2022 16:38   CT Head Wo Contrast . IMPRESSION: 1. No CT evidence of an acute intracranial abnormality. Please note, a brain MRI would have greater sensitivity for acute hypoxic/ischemic injury. 2. Parenchymal atrophy and chronic small vessel ischemic disease, as described. 3. Paranasal sinus  disease, as detailed. Electronically Signed   By: Jackey Loge D.O.   On: 11/17/2022 16:36

## 2022-11-17 NOTE — Consult Note (Signed)
Cardiology Consultation   Patient ID: Edward Mullen MRN: [id number]; DOB: 1955/08/29  Admit date: 11/17/2022 Date of Consult: 11/17/2022  PCP:  Morene Crocker, MD    HeartCare Providers Cardiologist:  None  Electrophysiologist:  Sherryl Manges, MD  Advanced Heart Failure:  Marca Ancona, MD       Patient Profile:   Edward Mullen is a 67 y.o. male with a hx of HTN, DM, chronic systolic CHF, NICM, CAD medically managed out of proportion to cardiomyopathy, VT, LBBB, BiV ICD insertion 07/2022, polysubstance abuse, CKD 3a who is being seen 11/17/2022 for the evaluation of cardiac arrest at the request of Edward Simmonds NP.  History of Present Illness:   Edward Mullen has longstanding history of CHF going back to 2018 in setting of positive cocaine use, EF 15-20% on initial presentation. Cardiac cath showed coronary disease but not significant enough to explain cardiomyopathy. He's been followed by CHF team. He has had multiple admissions over the years sporadically complicated by noncompliance with medications and follow-up. During a prior admission he required milrinone. He was also felt to have had syncope from VT before and required LifeVest. Last cath 10/2021 showed 80% 1st RPL, 30% mLAD, felt stable, medically managed. Last echo 05/2022 EF 20-25%. He then went onto have BiV-ICD insertion by Dr. Graciela Husbands in 07/2022. At wound check he had had ATP therapy since implant so amiodarone was increased and mexiletine added to regimen. He did not return for follow-up thereafter nor was he able to be reached by the paramedicine program so was discharged from their program.  He presented to the ED via EMS after being found in parking lot unresponsive with agonal respirations and pinpoint pupils. He was given Narcan. Per report, on EMS arrival he was noted to be in PEA rhythm and CPR was initiated, time to return of spontaneous circulation estimated at 8 minutes. On arrival to the  emergency room he was agitated, coughing, reaching, moving all extremities but in no acute distress. He was intubated for airway protection, subsequently became severely hypotensive. Had copious amounts of frothy bloody sputum from endotracheal tube. Central venous access was placed as was arterial access. He was started on norepinephrine and then later epinephrine infusion on critical care arrival critical care was asked to admit for cardiac arrest and post arrest shock. Cardiology asked to evaluate. Initial labs notable for K 6.1, Cr 2.0, Hgb 18.7, glucose 233, UDS + THC, cocaine, benzo. He is being admitted to the ICU with plan for CVP monitoring and diuresis if CVP elevated.    Past Medical History:  Diagnosis Date   Arthritis    "feels like it in my legs" (09/20/2014)   CAD (coronary artery disease)    Chronic kidney disease, stage 3a    Chronic systolic CHF (congestive heart failure)    Cocaine abuse    Hypertension    NICM (nonischemic cardiomyopathy)    NSVT (nonsustained ventricular tachycardia) 09/06/2016   pre diabetes    patient denies    Past Surgical History:  Procedure Laterality Date   BIV ICD INSERTION CRT-D N/A 07/17/2022   Procedure: BIV ICD INSERTION CRT-D;  Surgeon: Duke Salvia, MD;  Location: [facility] INVASIVE CV LAB;  Service: Cardiovascular;  Laterality: N/A;   CARDIAC CATHETERIZATION N/A 08/27/2016   Procedure: Right/Left Heart Cath and Coronary Angiography;  Surgeon: Laurey Morale, MD;  Location: [facility] INVASIVE CV LAB;  Service: Cardiovascular;  Laterality: N/A;   COLONOSCOPY     CYSTECTOMY Right    "  back of my shoulder"   RIGHT/LEFT HEART CATH AND CORONARY ANGIOGRAPHY N/A 12/04/2020   Procedure: RIGHT/LEFT HEART CATH AND CORONARY ANGIOGRAPHY;  Surgeon: Laurey Morale, MD;  Location: [facility] INVASIVE CV LAB;  Service: Cardiovascular;  Laterality: N/A;   RIGHT/LEFT HEART CATH AND CORONARY ANGIOGRAPHY N/A 10/10/2021   Procedure: RIGHT/LEFT HEART CATH AND CORONARY ANGIOGRAPHY;   Surgeon: Laurey Morale, MD;  Location: [facility] INVASIVE CV LAB;  Service: Cardiovascular;  Laterality: N/A;   TONSILLECTOMY       Home Medications:  Prior to Admission medications   Medication Sig Start Date End Date Taking? Authorizing Provider  albuterol (PROVENTIL) (2.5 MG/3ML) 0.083% nebulizer solution USE ONE VIAL (2.5 MG TOTAL) BY NEBULIZATION EVERY 6 (SIX) HOURS AS NEEDED FOR SHORTNESS OF BREATH OR WHEEZING. 10/29/22   Morene Crocker, MD  albuterol (VENTOLIN HFA) 108 (90 Base) MCG/ACT inhaler INHALE 1 TO 2 PUFFS BY MOUTH EVERY 6 HOURS AS NEEDED FOR WHEEZING OR SHORTNESS OF BREATH 10/29/22   Morene Crocker, MD  amiodarone (PACERONE) 200 MG tablet Take 2 tablets (400 mg total) by mouth 2 (two) times daily for 14 days, THEN 2 tablets (400 mg total) daily for 14 days, THEN 1 tablet (200 mg total) daily. 07/31/22 08/23/23  Duke Salvia, MD  aspirin EC 81 MG tablet Take 1 tablet (81 mg total) by mouth daily. Swallow whole. 01/29/22 01/30/23  Jacklynn Ganong, FNP  bismuth subsalicylate (PEPTO BISMOL) 262 MG/15ML suspension Take 30 mLs by mouth every 6 (six) hours as needed for indigestion. Patient not taking: Reported on 07/18/2022    [provider]  BREO ELLIPTA 100-25 MCG/ACT AEPB Inhale 1 puff into the lungs daily. 08/02/22 08/02/23  Morene Crocker, MD  carvedilol (COREG) 3.125 MG tablet Take 1 tablet (3.125 mg total) by mouth 2 (two) times daily. 01/29/22 01/29/23  Jacklynn Ganong, FNP  Cholecalciferol (VITAMIN D3) 50 MCG (2000 UT) TABS Take 1 tablet by mouth daily.    [provider]  digoxin (LANOXIN) 0.125 MG tablet TAKE 1 TABLET (0.125 MG TOTAL) BY MOUTH DAILY. 04/26/21   Laurey Morale, MD  JARDIANCE 10 MG TABS tablet TAKE 1 TABLET (10 MG TOTAL) BY MOUTH DAILY (AM) 10/01/22   Morene Crocker, MD  mexiletine (MEXITIL) 150 MG capsule Take 1 capsule (150 mg total) by mouth 2 (two) times daily. 07/31/22   Duke Salvia, MD  rosuvastatin  (CRESTOR) 5 MG tablet TAKE 1 TABLET (5 MG TOTAL) BY MOUTH DAILY. 12/26/20   Laurey Morale, MD  spironolactone (ALDACTONE) 25 MG tablet Take 1 tablet (25 mg total) by mouth daily. 04/24/22   Laurey Morale, MD  tamsulosin (FLOMAX) 0.4 MG CAPS capsule TAKE ONE CAPSULE BY MOUTH ONCE DAILY AFTER SUPPER 02/20/21   Belva Agee, MD  torsemide (DEMADEX) 20 MG tablet Take 1 tablet (20 mg total) by mouth daily as needed (extra fluid). 04/24/22   Laurey Morale, MD  umeclidinium bromide (INCRUSE ELLIPTA) 62.5 MCG/ACT AEPB Inhale 1 puff into the lungs daily. 06/17/22   Masters, Florentina Addison, DO    Inpatient Medications: Scheduled Meds:  acetaminophen  650 mg Oral Q4H   Or   acetaminophen (TYLENOL) oral liquid 160 mg/5 mL  650 mg Per Tube Q4H   Or   acetaminophen  650 mg Rectal Q4H   amiodarone  200 mg Per Tube Daily   arformoterol  15 mcg Nebulization BID   aspirin  325 mg Per Tube Daily   budesonide (PULMICORT) nebulizer solution  0.25  mg Nebulization BID   docusate  100 mg Per Tube BID   famotidine  20 mg Per Tube BID   heparin  5,000 Units Subcutaneous Q8H   pantoprazole (PROTONIX) IV  40 mg Intravenous QHS   polyethylene glycol  17 g Per Tube Daily   revefenacin  175 mcg Nebulization Daily   Continuous Infusions:  sodium chloride 1,000 mL (11/17/22 1408)   sodium chloride     ampicillin-sulbactam (UNASYN) IV     calcium gluconate 1,000 mg (11/17/22 1644)   epinephrine     fentaNYL infusion INTRAVENOUS 200 mcg/hr (11/17/22 1443)   norepinephrine 5 mcg/min (11/17/22 1532)   norepinephrine (LEVOPHED) Adult infusion     propofol (DIPRIVAN) infusion 40 mcg/kg/min (11/17/22 1455)   PRN Meds: sodium chloride, busPIRone **OR** busPIRone, etomidate, fentaNYL (SUBLIMAZE) injection, fentaNYL (SUBLIMAZE) injection, naloxone, norepinephrine, ondansetron (ZOFRAN) IV, succinylcholine  Allergies:   No Known Allergies  Social History:   Social History   Socioeconomic History   Marital  status: Married    Spouse name: Not on file   Number of children: Not on file   Years of education: Not on file   Highest education level: Not on file  Occupational History   Occupation: Special educational needs teacher    Comment: has not been able to work steadily for the last year or two  Tobacco Use   Smoking status: Every Day    Packs/day: 0.10    Years: 48.00    Additional pack years: 0.00    Total pack years: 4.80    Types: Cigarettes   Smokeless tobacco: Never   Tobacco comments:    cutting back 2  per day.  No cigs  Vaping Use   Vaping Use: Never used  Substance and Sexual Activity   Alcohol use: Yes    Alcohol/week: 1.0 standard drink of alcohol    Types: 1 Cans of beer per week    Comment: occasionally   Drug use: Yes    Types: Marijuana    Comment: daily 4 times last 2 weeks ago as of 05/31/2019   Sexual activity: Yes    Birth control/protection: None  Other Topics Concern   Not on file  Social History Narrative   Not on file   Social Determinants of Health   Financial Resource Strain: Low Risk  (06/17/2022)   Overall Financial Resource Strain (CARDIA)    Difficulty of Paying Living Expenses: Not hard at all  Food Insecurity: No Food Insecurity (06/17/2022)   Hunger Vital Sign    Worried About Running Out of Food in the Last Year: Never true    Ran Out of Food in the Last Year: Never true  Recent Concern: Food Insecurity - Food Insecurity Present (06/08/2022)   Hunger Vital Sign    Worried About Running Out of Food in the Last Year: Sometimes true    Ran Out of Food in the Last Year: Sometimes true  Transportation Needs: No Transportation Needs (06/17/2022)   PRAPARE - Administrator, Civil Service (Medical): No    Lack of Transportation (Non-Medical): No  Recent Concern: Transportation Needs - Unmet Transportation Needs (06/08/2022)   PRAPARE - Administrator, Civil Service (Medical): Yes    Lack of Transportation (Non-Medical): No  Physical  Activity: Inactive (06/17/2022)   Exercise Vital Sign    Days of Exercise per Week: 0 days    Minutes of Exercise per Session: 0 min  Stress: No Stress Concern Present (06/17/2022)  [employer] of Occupational Health - Occupational Stress Questionnaire    Feeling of Stress : Not at all  Social Connections: Moderately Isolated (06/17/2022)   Social Connection and Isolation Panel [NHANES]    Frequency of Communication with Friends and Family: More than three times a week    Frequency of Social Gatherings with Friends and Family: More than three times a week    Attends Religious Services: Never    Database administrator or Organizations: No    Attends Banker Meetings: Never    Marital Status: Married  Catering manager Violence: Not At Risk (06/17/2022)   Humiliation, Afraid, Rape, and Kick questionnaire    Fear of Current or Ex-Partner: No    Emotionally Abused: No    Physically Abused: No    Sexually Abused: No    Family History:   Family History  Problem Relation Age of Onset   Stroke Mother 63   Heart disease Father 78   Colon cancer Neg Hx    Colon polyps Neg Hx    Esophageal cancer Neg Hx    Stomach cancer Neg Hx    Rectal cancer Neg Hx      ROS:  Unable to obtain, intubated post arrest  Physical Exam/Data:   Vitals:   11/17/22 1656 11/17/22 1657 11/17/22 1658 11/17/22 1659  BP:      Pulse:      Resp: 18 (!) 22 (!) 27 (!) 22  Temp: 99.1 F (37.3 C) 99.1 F (37.3 C) 99.1 F (37.3 C) 99.1 F (37.3 C)  TempSrc:      SpO2:      Weight:      Height:        Intake/Output Summary (Last 24 hours) at 11/17/2022 1705 Last data filed at 11/17/2022 1455 Gross per 24 hour  Intake 308.57 ml  Output --  Net 308.57 ml      11/17/2022    2:03 PM 08/01/2022   11:45 AM 07/18/2022    1:15 PM  Last 3 Weights  Weight (lbs) 205 lb 205 lb 205 lb  Weight (kg) 92.987 kg 92.987 kg 92.987 kg     Body mass index is 27.8 kg/m.  Exam per MD   EKG:  The EKG was  personally reviewed and demonstrates:  AV pacing with LBBB morphology reviewed with MD   Relevant CV Studies: 2d echo 05/2022   1. Left ventricular ejection fraction, by estimation, is 20 to 25%. The  left ventricle has severely decreased function. The left ventricle has no  regional wall motion abnormalities. The left ventricular internal cavity  size was moderately dilated. There   is mild left ventricular hypertrophy. Left ventricular diastolic  parameters are consistent with Grade II diastolic dysfunction  (pseudonormalization).   2. Right ventricular systolic function is normal. The right ventricular  size is moderately enlarged. There is normal pulmonary artery systolic  pressure. The estimated right ventricular systolic pressure is 27.2 mmHg.   3. The mitral valve is normal in structure. Moderate mitral valve  regurgitation. No evidence of mitral stenosis.   4. The aortic valve is normal in structure. Aortic valve regurgitation is  mild. No aortic stenosis is present.   5. The inferior vena cava is dilated in size with >50% respiratory  variability, suggesting right atrial pressure of 8 mmHg.   Comparison(s): No significant change from prior study. Prior images  reviewed side by side.   Cath 10/2021    1st RPL lesion is  80% stenosed.   Mid LAD lesion is 30% stenosed.   1. Stable coronaries, there is 80% stenosis in a relatively small PLV branch.  2. Mildly elevated PCWP and LVEDP, normal RA pressure.  3. Cardiac index is decreased at 1.9.     Laboratory Data:  High Sensitivity Troponin:   Recent Labs  Lab 11/17/22 1405  TROPONINIHS 34*     Chemistry Recent Labs  Lab 11/17/22 1416 11/17/22 1433 11/17/22 1455  NA 138 138 137  K 6.1* 4.9 4.8  CL 105  --   --   GLUCOSE 233*  --   --   BUN 30*  --   --   CREATININE 2.00*  --   --     No results for input(s): "PROT", "ALBUMIN", "AST", "ALT", "ALKPHOS", "BILITOT" in the last 168 hours. Lipids No results for  input(s): "CHOL", "TRIG", "HDL", "LABVLDL", "LDLCALC", "CHOLHDL" in the last 168 hours.  Hematology Recent Labs  Lab 11/17/22 1405 11/17/22 1416 11/17/22 1433 11/17/22 1455  WBC 12.3*  --   --   --   RBC 5.96*  --   --   --   HGB 16.0 18.7* 17.3* 15.6  HCT 53.6* 55.0* 51.0 46.0  MCV 89.9  --   --   --   MCH 26.8  --   --   --   MCHC 29.9*  --   --   --   RDW 18.4*  --   --   --   PLT 153  --   --   --    Thyroid No results for input(s): "TSH", "FREET4" in the last 168 hours.  BNPNo results for input(s): "BNP", "PROBNP" in the last 168 hours.  DDimer No results for input(s): "DDIMER" in the last 168 hours.   Radiology/Studies:  CT CHEST ABDOMEN PELVIS W CONTRAST  Result Date: 11/17/2022 CLINICAL DATA:  Status post cardiac arrest.  Unresponsive. EXAM: CT CHEST, ABDOMEN, AND PELVIS WITH CONTRAST TECHNIQUE: Multidetector CT imaging of the chest, abdomen and pelvis was performed following the standard protocol during bolus administration of intravenous contrast. RADIATION DOSE REDUCTION: This exam was performed according to the departmental dose-optimization program which includes automated exposure control, adjustment of the mA and/or kV according to patient size and/or use of iterative reconstruction technique. CONTRAST:  75mL OMNIPAQUE IOHEXOL 350 MG/ML SOLN COMPARISON:  Portable chest obtained earlier today. Abdomen and pelvis CT dated 06/11/2022. Chest CT dated 10/05/2021. FINDINGS: CT CHEST FINDINGS Cardiovascular: Progressive enlargement of the heart. Atheromatous calcifications, including the coronary arteries and aorta. Intracardiac pacemaker leads and epicardial lead. No aortic aneurysm, dissection or gross pulmonary arterial filling defects. Mediastinum/Nodes: Nasogastric tube extending into the stomach. Endotracheal tube its tip 2.7 cm above the carina. No enlarged lymph nodes. Unremarkable thyroid gland. Lungs/Pleura: Extensive patchy and nodular airspace opacities in both lungs as  well as extensive ground-glass interstitial opacities. Minimal bilateral pleural fluid. No pneumothorax. Mild bullous changes in the medial right upper lobe. Musculoskeletal: Left anterior 3rd, 4th and 5th rib fractures without significant displacement. Right 2nd, 3rd and 7th anterior rib fractures without significant displacement. Possible minimally angulated right 4th, 5th and 6th rib fractures without visible fracture lines. Similar deformity involving the left anterior 6th rib. Fracture of the anterior cortex of the distal sternum. Thoracic and lower cervical spine degenerative changes including changes of DISH. CT ABDOMEN PELVIS FINDINGS Hepatobiliary: No focal liver abnormality is seen. No gallstones, gallbladder wall thickening, or biliary dilatation. Pancreas: Unremarkable. No pancreatic ductal dilatation  or surrounding inflammatory changes. Spleen: Normal in size without focal abnormality. Adrenals/Urinary Tract: 3 mm mid to lower right renal calculus. 1.2 cm heterogeneous low and medium density right adrenal nodule without significant change since 06/11/2022 previously shown represent a benign adenoma. Unremarkable left adrenal gland, left kidney and ureters. Foley catheter in the urinary bladder with a small amount of associated air and no significant urine in the bladder. Markedly enlarged prostate gland protruding into the bladder base. Stomach/Bowel: Nasogastric tube tip in the lateral aspect of the body of the stomach on the left. Mild colonic diverticulosis without evidence of diverticulitis. Normal-appearing appendix. Unremarkable small bowel. Vascular/Lymphatic: Atheromatous arterial calcifications without aneurysm. No enlarged lymph nodes. Reproductive: Markedly enlarged, heterogeneous prostate gland protruding into the bladder base. Other: No free peritoneal fluid or air. Musculoskeletal: Mild bilateral hip degenerative changes. Interbody and pedicle screw and rod fixation at the L3 through S1  levels normal alignment. Mild degenerative changes at the L2-3 level. IMPRESSION: 1. Extensive patchy and nodular airspace opacities in both lungs as well as extensive ground-glass interstitial opacities. This could be due to pulmonary edema, pneumonia and/or pulmonary contusions. 2. Minimal bilateral pleural fluid. 3. Progressive cardiomegaly. 4. Multiple bilateral anterior rib fractures without significant displacement. 5. Fracture of the anterior cortex of the distal sternum. 6. No acute abnormality in the abdomen or pelvis. 7. 3 mm nonobstructing right renal calculus. 8. Markedly enlarged, heterogeneous prostate gland protruding into the bladder base. 9. Mild colonic diverticulosis. 10. Calcific coronary artery and aortic atherosclerosis. 11. 1.2 cm heterogeneous low and medium density right adrenal nodule without significant change since 06/11/2022 previously shown to represent a benign adenoma. This does not need imaging follow-up. Aortic Atherosclerosis (ICD10-I70.0). Electronically Signed   By: Beckie Salts M.D.   On: 11/17/2022 16:38   CT Head Wo Contrast  Result Date: 11/17/2022 CLINICAL DATA:  Provided history: Delirium. Post arrest, unresponsive. EXAM: CT HEAD WITHOUT CONTRAST TECHNIQUE: Contiguous axial images were obtained from the base of the skull through the vertex without intravenous contrast. RADIATION DOSE REDUCTION: This exam was performed according to the departmental dose-optimization program which includes automated exposure control, adjustment of the mA and/or kV according to patient size and/or use of iterative reconstruction technique. COMPARISON:  Prior head CT examinations 06/10/2019 and earlier. FINDINGS: Brain: Redemonstrated chronic lacunar infarcts within the right basal ganglia and left thalamocapsular junction. Background moderate patchy and ill-defined hypoattenuation within the cerebral white matter, nonspecific but compatible with chronic small-vessel ischemic disease. There  is no acute intracranial hemorrhage. No demarcated cortical infarct. No extra-axial fluid collection. No evidence of an intracranial mass. No midline shift. Vascular: No hyperdense vessel.  Atherosclerotic calcifications. Skull: No fracture or aggressive osseous lesion. Sinuses/Orbits: No mass or acute finding within the imaged orbits. Mild mucosal thickening within the right maxillary sinus. 12 mm mucous retention cyst, and minimal background mucosal thickening, within the left maxillary sinus. Mild mucosal thickening, and small-volume fluid, scattered within bilateral ethmoid air cells. Trace mucosal thickening within the right frontal sinus. Other: Partially imaged life-support tubes. IMPRESSION: 1. No CT evidence of an acute intracranial abnormality. Please note, a brain MRI would have greater sensitivity for acute hypoxic/ischemic injury. 2. Parenchymal atrophy and chronic small vessel ischemic disease, as described. 3. Paranasal sinus disease, as detailed. Electronically Signed   By: Jackey Loge D.O.   On: 11/17/2022 16:36   DG Chest Portable 1 View  Result Date: 11/17/2022 CLINICAL DATA:  Intubation EXAM: PORTABLE CHEST 1 VIEW COMPARISON:  07/17/2022 FINDINGS: Endotracheal tube  terminates approximately 4.3 cm above the carina. Enteric tube courses below the diaphragm with distal tip beyond the inferior margin of the film. Cardiomegaly. Diffuse bilateral interstitial and alveolar airspace opacities, left slightly worse than right. No large pleural fluid collection. No pneumothorax. Left-sided implanted cardiac device remains in place. Overlying cardiac leads. IMPRESSION: 1. Endotracheal tube terminates approximately 4.3 cm above the carina. 2. Diffuse bilateral interstitial and alveolar airspace opacities, left slightly worse than right. Electronically Signed   By: Duanne GuessNicholas  Plundo D.O.   On: 11/17/2022 14:39     Assessment and Plan:   1. PEA arrest 2. H/o chronic systolic CHF/NICM with BiV-ICD in  place 3. Hyperkalemia with AKI 4. Polysubstance abuse with positive UDS + cocaine, THC, benzos 5. H/o VT, intended to be on a amiodarone + mexiletine as OP 6. Noncompliance with follow-up and medications 7. H/o CAD, managed medically by last cath 10/2021 8. DM  Patient seen/examined by MD. Chart prepared for consultation. Dr. Nelly LaurenceMealor interrogated device for further evaluation. See MD attestation for further recommendations. Will likely need CHF team to pick up in AM.   Risk Assessment/Risk Scores:        New York Heart Association (NYHA) Functional Class Unable to assess, cardiac arrest on arrival  For questions or updates, please contact [facility] Please consult [email or web address] for contact info under    Signed, Laurann MontanaDayna N Yareni Creps, PA-C  11/17/2022 5:05 PM

## 2022-11-17 NOTE — ED Provider Notes (Signed)
[place] EMERGENCY DEPARTMENT AT [facility] Provider Note  CSN: [id number] Arrival date & time: 11/17/22 1359  Chief Complaint(s) Cardiac Arrest  HPI Edward Mullen is a 67 y.o. male with PMH nonischemic cardiomyopathy with EF 25 to 30% status post AICD pacemaker placement who presents emergency department for evaluation of cardiac arrest.  Patient was reportedly found passed out in his car with agonal respirations in a parking lot.  EMS performed approximately 8 minutes of CPR with a persistently paced rhythm in the field, no shocks given.  EMS was able to achieve ROSC and patient arrives with LMA in place, agonal breath sounds and pinpoint pupils.  I did speak with the patient's daughter who states that the patient is daily crack cocaine user and may have overdosed today.  Additional history unable to be obtained secondary to patient's severe respiratory distress and nonverbal status.   Past Medical History Past Medical History:  Diagnosis Date   Arthritis    "feels like it in my legs" (09/20/2014)   CHF (congestive heart failure)    Dysrhythmia    Hypertension    NSVT (nonsustained ventricular tachycardia) 09/06/2016   pre diabetes    patient denies   Patient Active Problem List   Diagnosis Date Noted   LBBB (left bundle branch block) 10/21/2022   PVC (premature ventricular contraction) frequent 10/21/2022   COPD (chronic obstructive pulmonary disease) 06/07/2022   Hyperlipemia 04/04/2022   First time seizure 12/28/2021   Type 2 diabetes mellitus with stage 3b chronic kidney disease, without long-term current use of insulin 10/05/2021   Hemoptysis    Acute exacerbation of CHF (congestive heart failure) 10/04/2021   Urinary retention    Scrotal mass 08/01/2020   Nocturia 08/01/2020   Lumbar spinal stenosis 06/08/2019   Healthcare maintenance 05/11/2019   Housing problems 05/11/2019   Difficulty urinating 04/07/2018   Colon cancer screening 04/07/2018   Anemia  12/19/2016   CKD (chronic kidney disease) stage 3, GFR 30-59 ml/min 12/13/2016   Acute on chronic systolic heart failure 09/06/2016   Non-ischemic cardiomyopathy 08/27/2016   Non-obstructive hypertrophic cardiomyopathy 08/27/2016   Mitral regurgitation 08/27/2016   Polysubstance abuse (HCC)    AKI (acute kidney injury) 08/23/2016   Tobacco abuse 09/19/2014   HTN (hypertension) 03/28/2011   Bilateral lower extremity pain 03/28/2011   Home Medication(s) Prior to Admission medications   Medication Sig Start Date End Date Taking? Authorizing Provider  albuterol (PROVENTIL) (2.5 MG/3ML) 0.083% nebulizer solution USE ONE VIAL (2.5 MG TOTAL) BY NEBULIZATION EVERY 6 (SIX) HOURS AS NEEDED FOR SHORTNESS OF BREATH OR WHEEZING. 10/29/22   Morene Crocker, MD  albuterol (VENTOLIN HFA) 108 (90 Base) MCG/ACT inhaler INHALE 1 TO 2 PUFFS BY MOUTH EVERY 6 HOURS AS NEEDED FOR WHEEZING OR SHORTNESS OF BREATH 10/29/22   Morene Crocker, MD  amiodarone (PACERONE) 200 MG tablet Take 2 tablets (400 mg total) by mouth 2 (two) times daily for 14 days, THEN 2 tablets (400 mg total) daily for 14 days, THEN 1 tablet (200 mg total) daily. 07/31/22 08/23/23  Duke Salvia, MD  aspirin EC 81 MG tablet Take 1 tablet (81 mg total) by mouth daily. Swallow whole. 01/29/22 01/30/23  Jacklynn Ganong, FNP  bismuth subsalicylate (PEPTO BISMOL) 262 MG/15ML suspension Take 30 mLs by mouth every 6 (six) hours as needed for indigestion. Patient not taking: Reported on 07/18/2022    [provider]  BREO ELLIPTA 100-25 MCG/ACT AEPB Inhale 1 puff into the lungs daily. 08/02/22  08/02/23  Morene Crocker, MD  carvedilol (COREG) 3.125 MG tablet Take 1 tablet (3.125 mg total) by mouth 2 (two) times daily. 01/29/22 01/29/23  Jacklynn Ganong, FNP  Cholecalciferol (VITAMIN D3) 50 MCG (2000 UT) TABS Take 1 tablet by mouth daily.    [provider]  digoxin (LANOXIN) 0.125 MG tablet TAKE 1 TABLET (0.125 MG  TOTAL) BY MOUTH DAILY. 04/26/21   Laurey Morale, MD  JARDIANCE 10 MG TABS tablet TAKE 1 TABLET (10 MG TOTAL) BY MOUTH DAILY (AM) 10/01/22   Morene Crocker, MD  mexiletine (MEXITIL) 150 MG capsule Take 1 capsule (150 mg total) by mouth 2 (two) times daily. 07/31/22   Duke Salvia, MD  rosuvastatin (CRESTOR) 5 MG tablet TAKE 1 TABLET (5 MG TOTAL) BY MOUTH DAILY. 12/26/20   Laurey Morale, MD  spironolactone (ALDACTONE) 25 MG tablet Take 1 tablet (25 mg total) by mouth daily. 04/24/22   Laurey Morale, MD  tamsulosin (FLOMAX) 0.4 MG CAPS capsule TAKE ONE CAPSULE BY MOUTH ONCE DAILY AFTER SUPPER 02/20/21   Belva Agee, MD  torsemide (DEMADEX) 20 MG tablet Take 1 tablet (20 mg total) by mouth daily as needed (extra fluid). 04/24/22   Laurey Morale, MD  umeclidinium bromide (INCRUSE ELLIPTA) 62.5 MCG/ACT AEPB Inhale 1 puff into the lungs daily. 06/17/22   Masters, Florentina Addison, DO                                                                                                                                    Past Surgical History Past Surgical History:  Procedure Laterality Date   BIV ICD INSERTION CRT-D N/A 07/17/2022   Procedure: BIV ICD INSERTION CRT-D;  Surgeon: Duke Salvia, MD;  Location: [facility] INVASIVE CV LAB;  Service: Cardiovascular;  Laterality: N/A;   CARDIAC CATHETERIZATION N/A 08/27/2016   Procedure: Right/Left Heart Cath and Coronary Angiography;  Surgeon: Laurey Morale, MD;  Location: [facility] INVASIVE CV LAB;  Service: Cardiovascular;  Laterality: N/A;   COLONOSCOPY     CYSTECTOMY Right    "back of my shoulder"   RIGHT/LEFT HEART CATH AND CORONARY ANGIOGRAPHY N/A 12/04/2020   Procedure: RIGHT/LEFT HEART CATH AND CORONARY ANGIOGRAPHY;  Surgeon: Laurey Morale, MD;  Location: [facility] INVASIVE CV LAB;  Service: Cardiovascular;  Laterality: N/A;   RIGHT/LEFT HEART CATH AND CORONARY ANGIOGRAPHY N/A 10/10/2021   Procedure: RIGHT/LEFT HEART CATH AND CORONARY ANGIOGRAPHY;  Surgeon:  Laurey Morale, MD;  Location: [facility] INVASIVE CV LAB;  Service: Cardiovascular;  Laterality: N/A;   TONSILLECTOMY     Family History Family History  Problem Relation Age of Onset   Stroke Mother 66   Heart disease Father 37   Colon cancer Neg Hx    Colon polyps Neg Hx    Esophageal cancer Neg Hx    Stomach cancer Neg Hx    Rectal cancer Neg Hx     Social History  Social History   Tobacco Use   Smoking status: Every Day    Packs/day: 0.10    Years: 48.00    Additional pack years: 0.00    Total pack years: 4.80    Types: Cigarettes   Smokeless tobacco: Never   Tobacco comments:    cutting back 2  per day.  No cigs  Vaping Use   Vaping Use: Never used  Substance Use Topics   Alcohol use: Yes    Alcohol/week: 1.0 standard drink of alcohol    Types: 1 Cans of beer per week    Comment: occasionally   Drug use: Yes    Types: Marijuana    Comment: daily 4 times last 2 weeks ago as of 05/31/2019   Allergies Patient has no known allergies.  Review of Systems Review of Systems  Unable to perform ROS: Severe respiratory distress    Physical Exam Vital Signs  I have reviewed the triage vital signs BP 96/68   Pulse (!) 54   Temp 98.5 F (36.9 C)   Resp (!) 24   Ht 6' (1.829 m)   Wt 93 kg   SpO2 (!) 87%   BMI 27.80 kg/m   Physical Exam Vitals and nursing note reviewed.  Constitutional:      General: He is in acute distress.     Appearance: He is well-developed. He is toxic-appearing and diaphoretic.  HENT:     Head: Normocephalic and atraumatic.  Cardiovascular:     Heart sounds: No murmur heard. Pulmonary:     Effort: Respiratory distress present.  Abdominal:     Palpations: Abdomen is soft.     Tenderness: There is no abdominal tenderness.  Musculoskeletal:        General: No swelling.     Cervical back: Neck supple.  Skin:    Capillary Refill: Capillary refill takes more than 3 seconds.     Coloration: Skin is pale.  Neurological:     Mental Status:  He is alert.     ED Results and Treatments Labs (all labs ordered are listed, but only abnormal results are displayed) Labs Reviewed  CBC WITH DIFFERENTIAL/PLATELET - Abnormal; Notable for the following components:      Result Value   WBC 12.3 (*)    RBC 5.96 (*)    HCT 53.6 (*)    MCHC 29.9 (*)    RDW 18.4 (*)    Lymphs Abs 4.6 (*)    Monocytes Absolute 2.3 (*)    Abs Immature Granulocytes 0.36 (*)    All other components within normal limits  LACTIC ACID, PLASMA - Abnormal; Notable for the following components:   Lactic Acid, Venous >9.0 (*)    All other components within normal limits  I-STAT CHEM 8, ED - Abnormal; Notable for the following components:   Potassium 6.1 (*)    BUN 30 (*)    Creatinine, Ser 2.00 (*)    Glucose, Bld 233 (*)    Calcium, Ion 1.10 (*)    Hemoglobin 18.7 (*)    HCT 55.0 (*)    All other components within normal limits  I-STAT VENOUS BLOOD GAS, ED - Abnormal; Notable for the following components:   pH, Ven 7.024 (*)    pCO2, Ven 71.3 (*)    pO2, Ven 115 (*)    Bicarbonate 18.6 (*)    TCO2 21 (*)    Acid-base deficit 14.0 (*)    Calcium, Ion 1.14 (*)    Hemoglobin  17.3 (*)    All other components within normal limits  I-STAT ARTERIAL BLOOD GAS, ED - Abnormal; Notable for the following components:   pH, Arterial 7.133 (*)    pCO2 arterial 53.8 (*)    pO2, Arterial 172 (*)    Bicarbonate 18.0 (*)    TCO2 20 (*)    Acid-base deficit 12.0 (*)    All other components within normal limits  CULTURE, BLOOD (ROUTINE X 2)  CULTURE, BLOOD (ROUTINE X 2)  LACTIC ACID, PLASMA  COMPREHENSIVE METABOLIC PANEL  BLOOD GAS, VENOUS  RAPID URINE DRUG SCREEN, HOSP PERFORMED  URINALYSIS, ROUTINE W REFLEX MICROSCOPIC  BLOOD GAS, ARTERIAL  TROPONIN I (HIGH SENSITIVITY)                                                                                                                          Radiology DG Chest Portable 1 View  Result Date:  11/17/2022 CLINICAL DATA:  Intubation EXAM: PORTABLE CHEST 1 VIEW COMPARISON:  07/17/2022 FINDINGS: Endotracheal tube terminates approximately 4.3 cm above the carina. Enteric tube courses below the diaphragm with distal tip beyond the inferior margin of the film. Cardiomegaly. Diffuse bilateral interstitial and alveolar airspace opacities, left slightly worse than right. No large pleural fluid collection. No pneumothorax. Left-sided implanted cardiac device remains in place. Overlying cardiac leads. IMPRESSION: 1. Endotracheal tube terminates approximately 4.3 cm above the carina. 2. Diffuse bilateral interstitial and alveolar airspace opacities, left slightly worse than right. Electronically Signed   By: Duanne Guess D.O.   On: 11/17/2022 14:39    Pertinent labs & imaging results that were available during my care of the patient were reviewed by me and considered in my medical decision making (see MDM for details).  Medications Ordered in ED Medications  naloxone (NARCAN) injection (2 mg Intravenous Given 11/17/22 1406)  0.9 %  sodium chloride infusion (1,000 mLs Intravenous New Bag/Given 11/17/22 1408)  succinylcholine (ANECTINE) injection (120 mg Intravenous Given 11/17/22 1409)  etomidate (AMIDATE) injection (15 mg Intravenous Given 11/17/22 1408)  fentaNYL (SUBLIMAZE) injection 25 mcg (has no administration in time range)  fentaNYL (SUBLIMAZE) injection 25-100 mcg (50 mcg Intravenous Given 11/17/22 1426)  norepinephrine (LEVOPHED) 4mg  in (0.016 mg/mL) premix infusion (10 mcg/min Intravenous Rate/Dose Change 11/17/22 1509)  fentaNYL in NS (79mcg/ml) infusion-PREMIX (200 mcg/hr Intravenous Rate/Dose Change 11/17/22 1443)  norepinephrine (LEVOPHED) 4mg  in (0.016 mg/mL) premix infusion (has no administration in time range)  propofol (DIPRIVAN) 1000 MG/100ML infusion (40 mcg/kg/min  93 kg Intravenous Infusion Verify 11/17/22 1455)  sodium bicarbonate 1 mEq/mL injection (has no  administration in time range)  EPINEPHrine (ADRENALIN) 5 mg in NS 250 mL (0.02 mg/mL) premix infusion (10 mcg/min Intravenous New Bag/Given 11/17/22 1526)  PHENYLephrine 80 mcg/ml in normal saline Adult IV Push Syringe (For Blood Pressure Support) (80 mcg Intravenous Given 11/17/22 1408)  propofol (DIPRIVAN) 1000 MG/100ML infusion (0 mcg/kg/min  Paused 11/17/22 1433)  PHENYLephrine 80 mcg/ml in normal saline Adult  IV Push Syringe (For Blood Pressure Support) (160 mcg Intravenous Given 11/17/22 1431)  sodium bicarbonate injection 50 mEq (50 mEq Intravenous Given 11/17/22 1459)                                                                                                                                     Procedures .Critical Care  Performed by: Glendora Score, MD Authorized by: Glendora Score, MD   Critical care provider statement:    Critical care time (minutes):  80   Critical care was necessary to treat or prevent imminent or life-threatening deterioration of the following conditions:  Cardiac failure, respiratory failure, shock and circulatory failure   Critical care was time spent personally by me on the following activities:  Development of treatment plan with patient or surrogate, discussions with consultants, evaluation of patient's response to treatment, examination of patient, ordering and review of laboratory studies, ordering and review of radiographic studies, ordering and performing treatments and interventions, pulse oximetry, re-evaluation of patient's condition and review of old charts Procedure Name: Intubation Date/Time: 11/17/2022 7:48 PM  Performed by: Glendora Score, MDPre-anesthesia Checklist: Patient identified, Patient being monitored, Emergency Drugs available, Timeout performed and Suction available Oxygen Delivery Method: Non-rebreather mask Preoxygenation: Pre-oxygenation with 100% oxygen Induction Type: Rapid sequence Ventilation: Mask ventilation without  difficulty Laryngoscope Size: Glidescope and 3 Tube size: 8.0 mm Number of attempts: 1 Airway Equipment and Method: Video-laryngoscopy Placement Confirmation: ETT inserted through vocal cords under direct vision, CO2 detector and Breath sounds checked- equal and bilateral Secured at: 25 cm    ARTERIAL LINE  Date/Time: 11/17/2022 7:49 PM  Performed by: Glendora Score, MD Authorized by: Glendora Score, MD   Consent:    Consent obtained:  Emergent situation Indications:    Indications: hemodynamic monitoring and multiple ABGs   Pre-procedure details:    Skin preparation:  Chlorhexidine Sedation:    Sedation type:  None Anesthesia:    Anesthesia method:  None Procedure details:    Location:  L radial   Placement technique:  Seldinger   Number of attempts:  1 Post-procedure details:    Post-procedure:  Sterile dressing applied   CMS:  Normal   Procedure completion:  Tolerated well, no immediate complications .Central Line  Date/Time: 11/17/2022 7:50 PM  Performed by: Glendora Score, MD Authorized by: Glendora Score, MD   Consent:    Consent obtained:  Emergent situation Pre-procedure details:    Indication(s): central venous access     Skin preparation:  Chlorhexidine Sedation:    Sedation type:  None Anesthesia:    Anesthesia method:  None Procedure details:    Location:  R femoral   Procedural supplies:  Triple lumen   Ultrasound guidance: yes     Ultrasound guidance timing: real time     Number of attempts:  1   Successful placement: yes   Post-procedure details:    Post-procedure:  Line sutured and dressing  applied   Assessment:  Blood return through all ports   Procedure completion:  Tolerated well, no immediate complications   (including critical care time)  Medical Decision Making / ED Course   This patient presents to the ED for concern of cardiac arrest, this involves an extensive number of treatment options, and is a complaint that carries with  it a high risk of complications and morbidity.  The differential diagnosis includes acute hypoxic respiratory failure, polysubstance use, heart failure, arrhythmia, dissection, PE, sepsis  MDM: Patient seen emergency room for evaluation of cardiac arrest.  Physical exam reveals a toxic appearing patient with agonal breathing with LMA in place, pinpoint pupils.  Narcan given without effect.  LMA removed and replaced with definitive airway via RSI and endotracheal intubation.  Succinylcholine used for paralytic and upon this medicine wearing off, patient had severe agitation and was bucking the ventilator.  Patient required high-dose fentanyl drip, fentanyl pushes and ultimate propofol use.  This regimen caused fairly significant hypotension and Levophed ultimately initiated to maintain appropriate maps.  Arterial line and central line placed.  Laboratory evaluation with leukocytosis to 12.3, urinalysis without evidence of infection, mild hyperkalemia on i-STAT, BUN 30, creatinine 2.0, UDS positive for cocaine, benzodiazepines and marijuana.  Initial i-STAT VBG with a pH of 7.024 with a pCO2 of 71.  Bicarb given and after intubation, pH improving to 7.13 with improved pCO2 of 53.8.  Initial lactic acid greater than 9.  CT head unremarkable.  CT chest abdomen pelvis with extensive patchy and nodular airspace opacities likely consistent with acute pulmonary edema.  There are all also multiple bilateral anterior rib fractures, sternal fracture consistent with CPR performed by EMS.  Patient ultimately stabilized from a blood pressure standpoint and admitted to the ICU for further care.   Additional history obtained: -Additional history obtained from daughter -External records from outside source obtained and reviewed including: Chart review including previous notes, labs, imaging, consultation notes   Lab Tests: -I ordered, reviewed, and interpreted labs.   The pertinent results include:   Labs Reviewed   CBC WITH DIFFERENTIAL/PLATELET - Abnormal; Notable for the following components:      Result Value   WBC 12.3 (*)    RBC 5.96 (*)    HCT 53.6 (*)    MCHC 29.9 (*)    RDW 18.4 (*)    Lymphs Abs 4.6 (*)    Monocytes Absolute 2.3 (*)    Abs Immature Granulocytes 0.36 (*)    All other components within normal limits  LACTIC ACID, PLASMA - Abnormal; Notable for the following components:   Lactic Acid, Venous >9.0 (*)    All other components within normal limits  I-STAT CHEM 8, ED - Abnormal; Notable for the following components:   Potassium 6.1 (*)    BUN 30 (*)    Creatinine, Ser 2.00 (*)    Glucose, Bld 233 (*)    Calcium, Ion 1.10 (*)    Hemoglobin 18.7 (*)    HCT 55.0 (*)    All other components within normal limits  I-STAT VENOUS BLOOD GAS, ED - Abnormal; Notable for the following components:   pH, Ven 7.024 (*)    pCO2, Ven 71.3 (*)    pO2, Ven 115 (*)    Bicarbonate 18.6 (*)    TCO2 21 (*)    Acid-base deficit 14.0 (*)    Calcium, Ion 1.14 (*)    Hemoglobin 17.3 (*)    All other components within normal limits  I-STAT ARTERIAL BLOOD GAS, ED - Abnormal; Notable for the following components:   pH, Arterial 7.133 (*)    pCO2 arterial 53.8 (*)    pO2, Arterial 172 (*)    Bicarbonate 18.0 (*)    TCO2 20 (*)    Acid-base deficit 12.0 (*)    All other components within normal limits  CULTURE, BLOOD (ROUTINE X 2)  CULTURE, BLOOD (ROUTINE X 2)  LACTIC ACID, PLASMA  COMPREHENSIVE METABOLIC PANEL  BLOOD GAS, VENOUS  RAPID URINE DRUG SCREEN, HOSP PERFORMED  URINALYSIS, ROUTINE W REFLEX MICROSCOPIC  BLOOD GAS, ARTERIAL  TROPONIN I (HIGH SENSITIVITY)      EKG   EKG Interpretation  Date/Time:  Sunday November 17 2022 14:03:33 EDT Ventricular Rate:  60 PR Interval:  187 QRS Duration: 212 QT Interval:  519 QTC Calculation: 519 R Axis:   266 Text Interpretation: Sinus rhythm AV PACED RHYTHM Confirmed by Hermine Feria ([id number]) on 11/17/2022 7:55:53 PM         Imaging  Studies ordered: I ordered imaging studies including chest x-ray, CT head, chest abdomen pelvis I independently visualized and interpreted imaging. I agree with the radiologist interpretation   Medicines ordered and prescription drug management: Meds ordered this encounter  Medications   naloxone (NARCAN) injection   0.9 %  sodium chloride infusion   succinylcholine (ANECTINE) injection   etomidate (AMIDATE) injection   PHENYLephrine 80 mcg/ml in normal saline Adult IV Push Syringe (For Blood Pressure Support)   fentaNYL (SUBLIMAZE) injection 25 mcg   fentaNYL (SUBLIMAZE) injection 25-100 mcg   propofol (DIPRIVAN) 1000 MG/100ML infusion    Pulliam, Iseley P: cabinet override   norepinephrine (LEVOPHED) 4mg  in (0.016 mg/mL) premix infusion   PHENYLephrine 80 mcg/ml in normal saline Adult IV Push Syringe (For Blood Pressure Support)   fentaNYL in NS (34mcg/ml) infusion-PREMIX   norepinephrine (LEVOPHED) 4mg  in (0.016 mg/mL) premix infusion   propofol (DIPRIVAN) 1000 MG/100ML infusion   sodium bicarbonate injection 50 mEq   sodium bicarbonate 1 mEq/mL injection    Mikeal Hawthorne M: cabinet override   EPINEPHrine (ADRENALIN) 5 mg in NS 250 mL (0.02 mg/mL) premix infusion    -I have reviewed the patients home medicines and have made adjustments as needed  Critical interventions Intubation, A-line, central line, pressors  Consultations Obtained: I requested consultation with the intensivist,  and discussed lab and imaging findings as well as pertinent plan - they recommend: ICU admission   Cardiac Monitoring: The patient was maintained on a cardiac monitor.  I personally viewed and interpreted the cardiac monitored which showed an underlying rhythm of: AV paced rhythm  Social Determinants of Health:  Factors impacting patients care include: History of polysubstance abuse   Reevaluation: After the interventions noted above, I reevaluated the patient and  found that they have :improved  Co morbidities that complicate the patient evaluation  Past Medical History:  Diagnosis Date   Arthritis    "feels like it in my legs" (09/20/2014)   CHF (congestive heart failure)    Dysrhythmia    Hypertension    NSVT (nonsustained ventricular tachycardia) 09/06/2016   pre diabetes    patient denies      Dispostion: I considered admission for this patient, and due to cardiac arrest requiring intubation patient require hospital admission     Final Clinical Impression(s) / ED Diagnoses Final diagnoses:  Cardiac arrest     @PCDICTATION @    Glendora Score, MD 11/17/22 1956

## 2022-11-17 NOTE — Progress Notes (Signed)
Pt transported to CT and back to ED room without complication.

## 2022-11-17 NOTE — ED Triage Notes (Signed)
Per [facility] pt was found in parking lot with agonal breathing. Pupils were pinpoint. Given narcan. Performed about 8 mins of CPR. Given 50 mcg fentanyl and 5mg  versed. Reports paced rhythm en route.

## 2022-11-17 NOTE — H&P (Signed)
NAME:  Edward Mullen, MRN:  [id number], DOB:  [birth year], LOS: 0 ADMISSION DATE:  11/17/2022, CONSULTATION DATE:  4/7 REFERRING MD:  Kommor, CHIEF COMPLAINT:  cardiac arrest   History of Present Illness:  This is a 67 year-old male patient with a known history of nonischemic-cardiomyopathy status post ICD and pacemaker.  Followed in the outpatient setting by [facility] cardiology.  Presents to the emergency room via EMS after being found unresponsive with agonal respiratory status in a parking lot.  His pupils were pinpoint.  He was given intramuscular Narcan by EMS.  On EMS arrival he was noted to be in PEA rhythm and CPR was initiated, time to return of spontaneous circulation estimated at 8 minutes.  On arrival to the emergency room he was agitated, coughing, reaching, moving all extremities but in no acute distress.  He was intubated for airway protection, subsequently became severely hypotensive.  Had copious amounts of frothy bloody sputum from endotracheal tube.  Central venous access was placed as was arterial access.  He was started on norepinephrine and then later epinephrine infusion on critical care arrival critical care was asked to admit.  Pertinent  Medical History  Nonischemic cardiomyopathy, EF currently 20 to 25%, has AICD and pacemaker.  History of right bundle branch block, history of polysubstance abuse, history of nonsustained V. tach, prediabetes - Significant Hospital Events: Including procedures, antibiotic start and stop dates in addition to other pertinent events   4/7 admitted status post 8-minute cardiac arrest initial rhythm PEA/paced.  Intubated in the emergency room, subsequent shock  Interim History / Subjective:  Currently sedated on propofol infusion Objective   Blood pressure 96/68, pulse (Abnormal) 54, temperature (Abnormal) 96.9 F (36.1 C), temperature source Temporal, resp. rate 17, height 6' (1.829 m), weight 93 kg, SpO2 (Abnormal) 87 %.         Intake/Output Summary (Last 24 hours) at 11/17/2022 1526 Last data filed at 11/17/2022 1455 Gross per 24 hour  Intake 308.57 ml  Output no documentation  Net 308.57 ml   Filed Weights   11/17/22 1403  Weight: 93 kg    Examination: General: Chronically ill-appearing 67 year old male currently sedated on propofol requiring both norepinephrine and epinephrine infusion for vasoactive support HENT: Orally intubated has frothy bloody sputum from endotracheal tube, no current JVD pupils are pinpoint Lungs: Diffuse scattered rhonchi and rales, as noted before copious bloody secretions from endotracheal tube current Plateau pressures 27.  Portable chest x-ray personally reviewed endotracheal tube in satisfactory position, he is in diffuse pulmonary edema, cannot exclude element of aspiration Cardiovascular: Paced rhythm currently at 60.  Currently on high-dose norepinephrine, added epinephrine infusion for inotropic synergy Abdomen: soft not tender Extremities: cool. Sluggish CR pulses palp. Left fem line CVL  Neuro: Currently sedated on propofol, still has decent cough, was moving spontaneously prior to IV sedation without focal deficits. GU concentrated blood-tinged urine.   Resolved Hospital Problem list     Assessment & Plan:  Cardiac arrest.  PEA/with paced rhythm.  Time resuscitation estimated at 8 minutes Plan Admit to intensive care CT abdomen chest pelvis Telemetry monitoring Echocardiogram Urine drug screen pending  Cardiogenic shock status postcardiac arrest, with known history of underlying nonischemic cardiomyopathy.  Patient has a known underlying cardiomyopathy with a EF of 20 to 25%.  Currently on norepinephrine and epinephrine infusion, he had fairly decent response after adding inotropic support Plan Admit to the intensive care, Will eventually need central access for CVP monitoring, will do this up in  the intensive care Will obtain Co. oximetry and lactic acid If  central venous pressure elevated will need IV diuresis. For now continue epinephrine at 5 mics per minute. Titrate norepinephrine for mean arterial pressure greater than 65  Acute hypoxic respiratory failure status postcardiac arrest and complicated by diffuse pulmonary edema Plan Full ventilator support Central venous access for CVP monitoring Follow-up ABG following minute ventilation change PAD protocol with RASS goal -2 VAP bundle  H/o COPD Plan Triple nebulized tx  Lactic acidosis in setting of shock.  Favoring simply post CPR and secondary to endorgan hypoperfusion.  Cannot exclude element of sepsis Plan Blood cultures Repeat lactate with ongoing resuscitation efforts.  Hyperkalemia Plan Repeating arterial blood gas Lokelma Will give one-time dose of glucose, bicarbonate, and calcium gluconate with 10 units of insulin.  Acute metabolic and possibly toxic encephalopathy.  Initial concern for possible overdose given response to Narcan, however patient has significant cardiac history and sudden cardiac arrest is certainly a possibility.  Given downtime concern for anoxic brain injury Plan Continue full ventilator support CT head EEG Ensure euglycemia Normothermia protocol  Acute on chronic renal failure.  Baseline CKD stage IIIa Plan Ensure endorgan perfusion Renal dose medications Close observation with renal function following CT angiogram Strict intake output  Mild leukocytosis Plan Trend CBC Treating for potential aspiration as mentioned above    Best Practice (right click and "Reselect all SmartList Selections" daily)   Diet/type: NPO and NPO w/ meds via tube DVT prophylaxis: SCD GI prophylaxis: PPI Lines: Central line Foley:  Yes, and it is still needed Code Status:  full code Last date of multidisciplinary goals of care discussion [pending ]  Labs   CBC: Recent Labs  Lab 11/17/22 1405 11/17/22 1416 11/17/22 1433 11/17/22 1455  WBC 12.3*  --    --   --   NEUTROABS 4.9  --   --   --   HGB 16.0 18.7* 17.3* 15.6  HCT 53.6* 55.0* 51.0 46.0  MCV 89.9  --   --   --   PLT 153  --   --   --     Basic Metabolic Panel: Recent Labs  Lab 11/17/22 1416 11/17/22 1433 11/17/22 1455  NA 138 138 137  K 6.1* 4.9 4.8  CL 105  --   --   GLUCOSE 233*  --   --   BUN 30*  --   --   CREATININE 2.00*  --   --    GFR: Estimated Creatinine Clearance: 38.8 mL/min (A) (by C-G formula based on SCr of 2 mg/dL (H)). Recent Labs  Lab 11/17/22 1405  WBC 12.3*  LATICACIDVEN >9.0*    Liver Function Tests: No results for input(s): "AST", "ALT", "ALKPHOS", "BILITOT", "PROT", "ALBUMIN" in the last 168 hours. No results for input(s): "LIPASE", "AMYLASE" in the last 168 hours. No results for input(s): "AMMONIA" in the last 168 hours.  ABG    Component Value Date/Time   PHART 7.133 (LL) 11/17/2022 1455   PCO2ART 53.8 (H) 11/17/2022 1455   PO2ART 172 (H) 11/17/2022 1455   HCO3 18.0 (L) 11/17/2022 1455   TCO2 20 (L) 11/17/2022 1455   ACIDBASEDEF 12.0 (H) 11/17/2022 1455   O2SAT 99 11/17/2022 1455     Coagulation Profile: No results for input(s): "INR", "PROTIME" in the last 168 hours.  Cardiac Enzymes: No results for input(s): "CKTOTAL", "CKMB", "CKMBINDEX", "TROPONINI" in the last 168 hours.  HbA1C: Hgb A1c MFr Bld  Date/Time Value Ref Range Status  06/08/2022 04:55 AM 4.9 4.8 - 5.6 % Final    Comment:    (NOTE) Pre diabetes:          5.7%-6.4%  Diabetes:              >6.4%  Glycemic control for   <7.0% adults with diabetes   12/04/2020 09:00 AM 5.6 4.8 - 5.6 % Final    Comment:    (NOTE) Pre diabetes:          5.7%-6.4%  Diabetes:              >6.4%  Glycemic control for   <7.0% adults with diabetes     CBG: No results for input(s): "GLUCAP" in the last 168 hours.  Review of Systems:   Unable   Past Medical History:  He,  has a past medical history of Arthritis, CHF (congestive heart failure), Dysrhythmia,  Hypertension, NSVT (nonsustained ventricular tachycardia) (09/06/2016), and pre diabetes.   Surgical History:   Past Surgical History:  Procedure Laterality Date   BIV ICD INSERTION CRT-D N/A 07/17/2022   Procedure: BIV ICD INSERTION CRT-D;  Surgeon: Duke Salvia, MD;  Location: [facility] INVASIVE CV LAB;  Service: Cardiovascular;  Laterality: N/A;   CARDIAC CATHETERIZATION N/A 08/27/2016   Procedure: Right/Left Heart Cath and Coronary Angiography;  Surgeon: Laurey Morale, MD;  Location: [facility] INVASIVE CV LAB;  Service: Cardiovascular;  Laterality: N/A;   COLONOSCOPY     CYSTECTOMY Right    "back of my shoulder"   RIGHT/LEFT HEART CATH AND CORONARY ANGIOGRAPHY N/A 12/04/2020   Procedure: RIGHT/LEFT HEART CATH AND CORONARY ANGIOGRAPHY;  Surgeon: Laurey Morale, MD;  Location: [facility] INVASIVE CV LAB;  Service: Cardiovascular;  Laterality: N/A;   RIGHT/LEFT HEART CATH AND CORONARY ANGIOGRAPHY N/A 10/10/2021   Procedure: RIGHT/LEFT HEART CATH AND CORONARY ANGIOGRAPHY;  Surgeon: Laurey Morale, MD;  Location: [facility] INVASIVE CV LAB;  Service: Cardiovascular;  Laterality: N/A;   TONSILLECTOMY       Social History:   reports that he has been smoking cigarettes. He has a 4.80 pack-year smoking history. He has never used smokeless tobacco. He reports current alcohol use of about 1.0 standard drink of alcohol per week. He reports current drug use. Drug: Marijuana.   Family History:  His family history includes Heart disease (age of onset: 56) in his father; Stroke (age of onset: 80) in his mother. There is no history of Colon cancer, Colon polyps, Esophageal cancer, Stomach cancer, or Rectal cancer.   Allergies No Known Allergies   Home Medications  Prior to Admission medications   Medication Sig Start Date End Date Taking? Authorizing Provider  albuterol (PROVENTIL) (2.5 MG/3ML) 0.083% nebulizer solution USE ONE VIAL (2.5 MG TOTAL) BY NEBULIZATION EVERY 6 (SIX) HOURS AS NEEDED FOR SHORTNESS OF BREATH OR  WHEEZING. 10/29/22   Morene Crocker, MD  albuterol (VENTOLIN HFA) 108 (90 Base) MCG/ACT inhaler INHALE 1 TO 2 PUFFS BY MOUTH EVERY 6 HOURS AS NEEDED FOR WHEEZING OR SHORTNESS OF BREATH 10/29/22   Morene Crocker, MD  amiodarone (PACERONE) 200 MG tablet Take 2 tablets (400 mg total) by mouth 2 (two) times daily for 14 days, THEN 2 tablets (400 mg total) daily for 14 days, THEN 1 tablet (200 mg total) daily. 07/31/22 08/23/23  Duke Salvia, MD  aspirin EC 81 MG tablet Take 1 tablet (81 mg total) by mouth daily. Swallow whole. 01/29/22 01/30/23  Jacklynn Ganong, FNP  bismuth subsalicylate (PEPTO BISMOL) 262 MG/15ML suspension  Take 30 mLs by mouth every 6 (six) hours as needed for indigestion. Patient not taking: Reported on 07/18/2022    [provider]  BREO ELLIPTA 100-25 MCG/ACT AEPB Inhale 1 puff into the lungs daily. 08/02/22 08/02/23  Morene Crocker, MD  carvedilol (COREG) 3.125 MG tablet Take 1 tablet (3.125 mg total) by mouth 2 (two) times daily. 01/29/22 01/29/23  Jacklynn Ganong, FNP  Cholecalciferol (VITAMIN D3) 50 MCG (2000 UT) TABS Take 1 tablet by mouth daily.    [provider]  digoxin (LANOXIN) 0.125 MG tablet TAKE 1 TABLET (0.125 MG TOTAL) BY MOUTH DAILY. 04/26/21   Laurey Morale, MD  JARDIANCE 10 MG TABS tablet TAKE 1 TABLET (10 MG TOTAL) BY MOUTH DAILY (AM) 10/01/22   Morene Crocker, MD  mexiletine (MEXITIL) 150 MG capsule Take 1 capsule (150 mg total) by mouth 2 (two) times daily. 07/31/22   Duke Salvia, MD  rosuvastatin (CRESTOR) 5 MG tablet TAKE 1 TABLET (5 MG TOTAL) BY MOUTH DAILY. 12/26/20   Laurey Morale, MD  spironolactone (ALDACTONE) 25 MG tablet Take 1 tablet (25 mg total) by mouth daily. 04/24/22   Laurey Morale, MD  tamsulosin (FLOMAX) 0.4 MG CAPS capsule TAKE ONE CAPSULE BY MOUTH ONCE DAILY AFTER SUPPER 02/20/21   Belva Agee, MD  torsemide (DEMADEX) 20 MG tablet Take 1 tablet (20 mg total) by mouth daily  as needed (extra fluid). 04/24/22   Laurey Morale, MD  umeclidinium bromide (INCRUSE ELLIPTA) 62.5 MCG/ACT AEPB Inhale 1 puff into the lungs daily. 06/17/22   Masters, Florentina Addison, DO     Critical care time: 45 minutes   Simonne Martinet ACNP-BC [facility] Pager # [phone] OR # [phone] if no answer

## 2022-11-17 NOTE — Progress Notes (Signed)
RT was requested to advanced ET tube by CCM MD. ET tube is already  advanced as far as able at 28 cm measured at the lip. RT informed CCM MD and MD stated he was fine with placement. Equal bilateral breath sounds and good volume return on vent.     11/17/22 1705  Therapy Vitals  Temp 99.1 F (37.3 C)  Resp (!) 30  MEWS Score/Color  MEWS Score 3  MEWS Score Color Yellow  Respiratory Assessment  Assessment Type Assess only  Respiratory Pattern Regular;Unlabored;Tachypnea  Chest Assessment Chest expansion symmetrical  Cough Productive  Bilateral Breath Sounds Diminished;Rhonchi  Oxygen Therapy/Pulse Ox  O2 Device Ventilator  FiO2 (%) 60 %  SpO2 97 %  Airway 8 mm  Placement Date/Time: 11/17/22 1411   Grade View: Grade 1  Placed By: ED Physician  Airway Device: Endotracheal Tube  Laryngoscope Blade: MAC;3  ETT Types: Oral  Size (mm): 8 mm  Cuffed: Cuffed  Insertion attempts: 1  Airway Equipment: Video Laryngosco...  Secured at (cm) 28 cm  Measured From Lips  Secured Location Left  Secured By Brewing technologist Repositioned Yes  Prone position No  Cuff Pressure (cm H2O) Clear OR 27-39 CmH2O  Site Condition Dry  Respiratory  Airway LDA ETT

## 2022-11-17 NOTE — Progress Notes (Signed)
Pharmacy Antibiotic Note  Edward Mullen is a 67 y.o. male admitted on 11/17/2022 presenting post-arrest, concern for aspiration.  Pharmacy has been consulted for Unasyn dosing.  Plan: Unasyn 3g IV every 12 hours Monitor renal function, clinical progression and LOT  Height: 6' (182.9 cm) Weight: 93 kg (205 lb) IBW/kg (Calculated) : 77.6  Temp (24hrs), Avg:98.4 F (36.9 C), Min:96.9 F (36.1 C), Max:99.2 F (37.3 C)  Recent Labs  Lab 11/17/22 1405 11/17/22 1416  WBC 12.3*  --   CREATININE  --  2.00*  LATICACIDVEN >9.0*  --     Estimated Creatinine Clearance: 38.8 mL/min (A) (by C-G formula based on SCr of 2 mg/dL (H)).    No Known Allergies  [name], PharmD, [facility] Clinical Pharmacist ED Pharmacist Phone # [phone] 11/17/2022 4:48 PM

## 2022-11-17 NOTE — Progress Notes (Signed)
Sputum collected at this time and sent to lab

## 2022-11-17 NOTE — Progress Notes (Signed)
Transported from ED to [facility] 19 without complications

## 2022-11-18 ENCOUNTER — Inpatient Hospital Stay (HOSPITAL_COMMUNITY): Payer: Medicare HMO

## 2022-11-18 DIAGNOSIS — R4182 Altered mental status, unspecified: Secondary | ICD-10-CM | POA: Diagnosis not present

## 2022-11-18 DIAGNOSIS — R570 Cardiogenic shock: Secondary | ICD-10-CM | POA: Diagnosis not present

## 2022-11-18 DIAGNOSIS — I469 Cardiac arrest, cause unspecified: Secondary | ICD-10-CM

## 2022-11-18 DIAGNOSIS — R569 Unspecified convulsions: Secondary | ICD-10-CM

## 2022-11-18 LAB — CULTURE, RESPIRATORY W GRAM STAIN

## 2022-11-18 LAB — POCT I-STAT 7, (LYTES, BLD GAS, ICA,H+H)
Acid-Base Excess: 1 mmol/L (ref 0.0–2.0)
Bicarbonate: 23.4 mmol/L (ref 20.0–28.0)
Calcium, Ion: 1.13 mmol/L — ABNORMAL LOW (ref 1.15–1.40)
HCT: 43 % (ref 39.0–52.0)
Hemoglobin: 14.6 g/dL (ref 13.0–17.0)
O2 Saturation: 98 %
Patient temperature: 37.1
Potassium: 4 mmol/L (ref 3.5–5.1)
Sodium: 139 mmol/L (ref 135–145)
TCO2: 24 mmol/L (ref 22–32)
pCO2 arterial: 31.5 mmHg — ABNORMAL LOW (ref 32–48)
pH, Arterial: 7.478 — ABNORMAL HIGH (ref 7.35–7.45)
pO2, Arterial: 95 mmHg (ref 83–108)

## 2022-11-18 LAB — MAGNESIUM
Magnesium: 1.6 mg/dL — ABNORMAL LOW (ref 1.7–2.4)
Magnesium: 2.5 mg/dL — ABNORMAL HIGH (ref 1.7–2.4)

## 2022-11-18 LAB — ECHOCARDIOGRAM COMPLETE
AR max vel: 1.97 cm2
AV Area VTI: 1.76 cm2
AV Area mean vel: 1.71 cm2
AV Mean grad: 12 mmHg
AV Peak grad: 16.6 mmHg
Ao pk vel: 2.04 m/s
Area-P 1/2: 2.51 cm2
Height: 72 in
MV M vel: 2.99 m/s
MV Peak grad: 35.8 mmHg
MV VTI: 1.87 cm2
P 1/2 time: 672 msec
S' Lateral: 5.6 cm
Weight: 3174.62 oz

## 2022-11-18 LAB — COMPREHENSIVE METABOLIC PANEL
ALT: 33 U/L (ref 0–44)
ALT: 36 U/L (ref 0–44)
AST: 43 U/L — ABNORMAL HIGH (ref 15–41)
AST: 48 U/L — ABNORMAL HIGH (ref 15–41)
Albumin: 2.7 g/dL — ABNORMAL LOW (ref 3.5–5.0)
Albumin: 2.8 g/dL — ABNORMAL LOW (ref 3.5–5.0)
Alkaline Phosphatase: 103 U/L (ref 38–126)
Alkaline Phosphatase: 95 U/L (ref 38–126)
Anion gap: 13 (ref 5–15)
Anion gap: 16 — ABNORMAL HIGH (ref 5–15)
BUN: 20 mg/dL (ref 8–23)
BUN: 20 mg/dL (ref 8–23)
CO2: 20 mmol/L — ABNORMAL LOW (ref 22–32)
CO2: 20 mmol/L — ABNORMAL LOW (ref 22–32)
Calcium: 8.1 mg/dL — ABNORMAL LOW (ref 8.9–10.3)
Calcium: 8.5 mg/dL — ABNORMAL LOW (ref 8.9–10.3)
Chloride: 104 mmol/L (ref 98–111)
Chloride: 100 mmol/L (ref 98–111)
Creatinine, Ser: 1.63 mg/dL — ABNORMAL HIGH (ref 0.61–1.24)
Creatinine, Ser: 1.7 mg/dL — ABNORMAL HIGH (ref 0.61–1.24)
GFR, Estimated: 46 mL/min — ABNORMAL LOW (ref >60)
GFR, Estimated: 43 mL/min — ABNORMAL LOW (ref >60)
Glucose, Bld: 123 mg/dL — ABNORMAL HIGH (ref 70–99)
Glucose, Bld: 102 mg/dL — ABNORMAL HIGH (ref 70–99)
Potassium: 3.9 mmol/L (ref 3.5–5.1)
Potassium: 4.4 mmol/L (ref 3.5–5.1)
Sodium: 137 mmol/L (ref 135–145)
Sodium: 136 mmol/L (ref 135–145)
Total Bilirubin: 1.1 mg/dL (ref 0.3–1.2)
Total Bilirubin: 1.5 mg/dL — ABNORMAL HIGH (ref 0.3–1.2)
Total Protein: 6.7 g/dL (ref 6.5–8.1)
Total Protein: 7 g/dL (ref 6.5–8.1)

## 2022-11-18 LAB — RESPIRATORY PANEL BY PCR

## 2022-11-18 LAB — PROCALCITONIN: Procalcitonin: 0.59 ng/mL

## 2022-11-18 LAB — PSA: Prostatic Specific Antigen: 2.32 ng/mL (ref 0.00–4.00)

## 2022-11-18 LAB — COOXEMETRY PANEL
Carboxyhemoglobin: 1.9 % — ABNORMAL HIGH (ref 0.5–1.5)
Methemoglobin: <0.7 % (ref 0.0–1.5)
O2 Saturation: 74 %
Total hemoglobin: 14 g/dL (ref 12.0–16.0)

## 2022-11-18 LAB — PHOSPHORUS
Phosphorus: 3 mg/dL (ref 2.5–4.6)
Phosphorus: 2.7 mg/dL (ref 2.5–4.6)

## 2022-11-18 LAB — STREP PNEUMONIAE URINARY ANTIGEN: Strep Pneumo Urinary Antigen: NEGATIVE

## 2022-11-18 LAB — GLUCOSE, CAPILLARY
Glucose-Capillary: 117 mg/dL — ABNORMAL HIGH (ref 70–99)
Glucose-Capillary: 118 mg/dL — ABNORMAL HIGH (ref 70–99)
Glucose-Capillary: 121 mg/dL — ABNORMAL HIGH (ref 70–99)
Glucose-Capillary: 130 mg/dL — ABNORMAL HIGH (ref 70–99)
Glucose-Capillary: 134 mg/dL — ABNORMAL HIGH (ref 70–99)
Glucose-Capillary: 97 mg/dL (ref 70–99)

## 2022-11-18 LAB — TRIGLYCERIDES: Triglycerides: 110 mg/dL (ref <150)

## 2022-11-18 LAB — LACTIC ACID, PLASMA: Lactic Acid, Venous: 2.1 mmol/L (ref 0.5–1.9)

## 2022-11-18 MED ORDER — PROSOURCE TF20 ENFIT COMPATIBL EN LIQD
60.0000 mL | Freq: Every day | ENTERAL | Status: DC
  Administered 2022-11-18 – 2022-11-20 (×3): 60 mL
  Filled 2022-11-18 (×3): qty 60

## 2022-11-18 MED ORDER — CHLORHEXIDINE GLUCONATE CLOTH 2 % EX PADS
6.0000 | MEDICATED_PAD | Freq: Every day | CUTANEOUS | Status: DC
  Administered 2022-11-18 – 2022-11-21 (×5): 6 via TOPICAL

## 2022-11-18 MED ORDER — PERFLUTREN LIPID MICROSPHERE
1.0000 mL | INTRAVENOUS | Status: AC | PRN
  Administered 2022-11-18: 2 mL via INTRAVENOUS

## 2022-11-18 MED ORDER — MAGNESIUM SULFATE 4 GM/100ML IV SOLN
4.0000 g | Freq: Once | INTRAVENOUS | Status: AC
  Administered 2022-11-18: 4 g via INTRAVENOUS
  Filled 2022-11-18: qty 100

## 2022-11-18 MED ORDER — FUROSEMIDE 10 MG/ML IJ SOLN
40.0000 mg | Freq: Once | INTRAMUSCULAR | Status: AC
  Administered 2022-11-18: 40 mg via INTRAVENOUS
  Filled 2022-11-18: qty 4

## 2022-11-18 MED ORDER — POTASSIUM CHLORIDE 20 MEQ PO PACK
40.0000 meq | PACK | Freq: Once | ORAL | Status: AC
  Administered 2022-11-18: 40 meq
  Filled 2022-11-18: qty 2

## 2022-11-18 MED ORDER — VITAL AF 1.2 CAL PO LIQD
1000.0000 mL | ORAL | Status: DC
  Administered 2022-11-18 – 2022-11-20 (×3): 1000 mL
  Filled 2022-11-18: qty 1000

## 2022-11-18 MED ORDER — POTASSIUM CHLORIDE CRYS ER 20 MEQ PO TBCR
40.0000 meq | EXTENDED_RELEASE_TABLET | Freq: Once | ORAL | Status: DC
  Filled 2022-11-18: qty 2

## 2022-11-18 NOTE — TOC Initial Note (Signed)
Transition of Care [facility]) - Initial/Assessment Note    Patient Details  Name: Edward Mullen MRN: [id number] Date of Birth: 09-Sep-1955  [facility]) CM/SW Contact:    [name] Cousin, RN Phone Number: [phone] 11/18/2022, 4:10 PM  Clinical Narrative:  Visit to pt's room and no family.              Contacted pt's wife and dtr, unable to leave message. CM will continue to follow for dc needs.   Expected Discharge Plan: Skilled Nursing Facility Barriers to Discharge: Continued Medical Work up   Patient Goals and CMS Choice       Expected Discharge Plan and Services   Discharge Planning Services: CM Consult         Prior Living Arrangements/Services   Lives with:: Spouse                   Activities of Daily Living      Permission Sought/Granted                  Emotional Assessment   Attitude/Demeanor/Rapport: Intubated (Following Commands or Not Following Commands)          Admission diagnosis:  Cardiac arrest [I46.9] Patient Active Problem List   Diagnosis Date Noted   Cardiogenic shock 11/18/2022   Cardiac arrest 11/17/2022   LBBB (left bundle branch block) 10/21/2022   PVC (premature ventricular contraction) frequent 10/21/2022   COPD (chronic obstructive pulmonary disease) 06/07/2022   Hyperlipemia 04/04/2022   First time seizure 12/28/2021   Type 2 diabetes mellitus with stage 3b chronic kidney disease, without long-term current use of insulin 10/05/2021   Hemoptysis    Acute exacerbation of CHF (congestive heart failure) 10/04/2021   Urinary retention    Scrotal mass 08/01/2020   Nocturia 08/01/2020   Lumbar spinal stenosis 06/08/2019   Healthcare maintenance 05/11/2019   Housing problems 05/11/2019   Difficulty urinating 04/07/2018   Colon cancer screening 04/07/2018   Anemia 12/19/2016   CKD (chronic kidney disease) stage 3, GFR 30-59 ml/min 12/13/2016   Acute on chronic systolic heart failure 09/06/2016    Non-ischemic cardiomyopathy 08/27/2016   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 [place] [address] Phone: [phone] Fax: [phone]  [facility] - [place], [place] - [address] [place] [address] Phone: [phone] Fax: [phone]     Social Determinants of Health (SDOH) Social History: SDOH Screenings   Food Insecurity: No Food Insecurity (06/17/2022)  Recent Concern: Food Insecurity - Food Insecurity Present (06/08/2022)  Housing: Low Risk  (06/17/2022)  Transportation Needs: No Transportation Needs (06/17/2022)  Recent Concern: Transportation Needs - Unmet Transportation Needs (06/08/2022)  Utilities: Not At Risk (06/17/2022)  Alcohol Screen: Low Risk  (06/17/2022)  Depression (PHQ2-9): Low Risk  (06/17/2022)  Financial Resource Strain: Low Risk  (06/17/2022)  Physical Activity: Inactive (06/17/2022)  Social Connections: Moderately Isolated (06/17/2022)  Stress: No Stress Concern Present (06/17/2022)  Tobacco Use: High Risk (11/17/2022)   SDOH Interventions:     Readmission Risk Interventions     No data to display

## 2022-11-18 NOTE — Progress Notes (Signed)
EEG complete - results pending 

## 2022-11-18 NOTE — Progress Notes (Signed)
Initial Nutrition Assessment  DOCUMENTATION CODES:   Non-severe (moderate) malnutrition in context of chronic illness (likely component of social/environmental as well)  INTERVENTION:   Tube Feeding via OG: Vital AF 1.2 at 60 ml/hr with Pro-Source TF20 60 mL daily Begin TF at rate of 20 ml/hr, titrate by 10 mL q 8 hours until goal rate of 60 ml/hr TF regimen at goal provides 128 g of protein, 1808 kcals and 1166 mL of free water  Additional calories from Propofol   NUTRITION DIAGNOSIS:   Moderate Malnutrition related to chronic illness as evidenced by moderate muscle depletion, moderate fat depletion.  GOAL:   Patient will meet greater than or equal to 90% of their needs   MONITOR:   TF tolerance, Vent status, Labs, Weight trends  REASON FOR ASSESSMENT:   Ventilator, Consult Enteral/tube feeding initiation and management  ASSESSMENT:   67 yo male presents to ED after being found unresponsive in his car, PEA rhythm on arrival, CPR initiated, ROSC estimated 8 minutes. Intubated, cardiogenic shock, HF consulted. UDS + cocaine, THC, BZDs. +Rhinovirus. PMH includes CAD, CKD 3a, CHF, non-ischemic CM s/p ICD and pacemeker, cocaine abuse, HTN, COPD  Pt remains on vent support, TTM 37 degrees Requiring levo at 12, epinephrine at 2 (levo up with titration of epi down) Propofol: 22.4 ml/hr  Current wt 90 kg  Lactic Acid >9 on admission, trending down, now 2.1  OG tube tip courses below GE junction per chest xray  Unable to obtain diet and weight history at this time  Labs: potassium 4.0 (wdl), phosphorus 3.0 (wdl), magnesium 1.6 )L), CBGs 97-132 Meds: colace, miralax, pepcid   NUTRITION - FOCUSED PHYSICAL EXAM: Limited exam due to [employer] Most Recent Value  Orbital Region Moderate depletion  Upper Arm Region Moderate depletion  Thoracic and Lumbar Region Unable to assess  Buccal Region Moderate depletion  Temple Region Severe depletion   Clavicle Bone Region Moderate depletion  Clavicle and Acromion Bone Region Moderate depletion  Scapular Bone Region Moderate depletion  Dorsal Hand Moderate depletion  Patellar Region Unable to assess  Anterior Thigh Region Unable to assess  Posterior Calf Region Unable to assess  Edema (RD Assessment) None       Diet Order:   Diet Order     None       EDUCATION NEEDS:   Not appropriate for education at this time  Skin:  Skin Assessment: Reviewed RN Assessment  Last BM:  no documented BM, PTA  Height:   Ht Readings from Last 1 Encounters:  11/17/22 6' (1.829 m)    Weight:   Wt Readings from Last 1 Encounters:  11/18/22 90 kg     BMI:  Body mass index is 26.91 kg/m.  Estimated Nutritional Needs:   Kcal:  1950-2150 kcals  Protein:  115-130 g  Fluid:  1.7 L   Romelle Starcher MS, RDN, LDN, CNSC Registered Dietitian 3 Clinical Nutrition RD Pager and On-Call Pager Number Located in [place]

## 2022-11-18 NOTE — Consult Note (Addendum)
Advanced Heart Failure Team Consult Note   Primary Physician: Morene Crocker, MD PCP-Cardiologist: Dr Shirlee Latch   Reason for Consultation: Cardiogenic Shock   HPI:    Edward Mullen is seen today for evaluation of cardiogenic shock  at the request of Dr Nelly Laurence.   Edward Mullen is 67 year old with a history of HTN, DMI. CAD, VT, LBBB, BiV ICD, CKD Stage IIIa, polysubstance abuse, NIMC, and HFrEF. Followed in the HF clinic several years.    LHC  2023 1st RPL 80%, 30% mLAD; mildly elevated PCWP and LVEDP, normal RA pressures, CI 1.9   Echo 2023 EF 20-25% RV normal.   Found unresponsive in his parked car.  EMS noted PEA and started CPR  and given narcan with ROSC ~ 8 min. On arrival to ED he was agitated requiring intubation for airway protection. Developed hypotension. Started on pressors. Admitted by CCM. EP interrogated device no events detected. VT zone set to 150 bpm. UDS + benzo + cocaine + THC. Other pertinent labs included: lactic acid >9, HS Trop 34>65, WBC 12, Hgb 16,  and K 6.1. CT chest multiple bilateral rib fractures+ fractured sternum. CT head no acute findings.   Currently on Epi 2 mcg + Norepi 8 mcg.   Creatinine 2.1>1.6  Lactic acid >9-->7 ->2.1 today  Bld Cx - NGTD   Echo pending   Review of Systems: [y] = yes, [ ]  = no Patient is encephalopathic and or intubated. Therefore history has been obtained from chart review.    General: Weight gain [ ] ; Weight loss [ ] ; Anorexia [ ] ; Fatigue [ ] ; Fever [ ] ; Chills [ ] ; Weakness [ ]   Cardiac: Chest pain/pressure [ ] ; Resting SOB [ ] ; Exertional SOB [ ] ; Orthopnea [ ] ; Pedal Edema [ ] ; Palpitations [ ] ; Syncope [ ] ; Presyncope [ ] ; Paroxysmal nocturnal dyspnea[ ]   Pulmonary: Cough [ ] ; Wheezing[ ] ; Hemoptysis[ ] ; Sputum [ ] ; Snoring [ ]   GI: Vomiting[ ] ; Dysphagia[ ] ; Melena[ ] ; Hematochezia [ ] ; Heartburn[ ] ; Abdominal pain [ ] ; Constipation [ ] ; Diarrhea [ ] ; BRBPR [ ]   GU: Hematuria[ ] ; Dysuria [ ] ; Nocturia[ ]    Vascular: Pain in legs with walking [ ] ; Pain in feet with lying flat [ ] ; Non-healing sores [ ] ; Stroke [ ] ; TIA [ ] ; Slurred speech [ ] ;  Neuro: Headaches[ ] ; Vertigo[ ] ; Seizures[ ] ; Paresthesias[ ] ;Blurred vision [ ] ; Diplopia [ ] ; Vision changes [ ]   Ortho/Skin: Arthritis [ ] ; Joint pain [ ] ; Muscle pain [ ] ; Joint swelling [ ] ; Back Pain [ ] ; Rash [ ]   Psych: Depression[ ] ; Anxiety[ ]   Heme: Bleeding problems [ ] ; Clotting disorders [ ] ; Anemia [ ]   Endocrine: Diabetes [Y ]; Thyroid dysfunction[ ]   Home Medications Prior to Admission medications   Medication Sig Start Date End Date Taking? Authorizing Provider  albuterol (PROVENTIL) (2.5 MG/3ML) 0.083% nebulizer solution USE ONE VIAL (2.5 MG TOTAL) BY NEBULIZATION EVERY 6 (SIX) HOURS AS NEEDED FOR SHORTNESS OF BREATH OR WHEEZING. 10/29/22   Morene Crocker, MD  albuterol (VENTOLIN HFA) 108 (90 Base) MCG/ACT inhaler INHALE 1 TO 2 PUFFS BY MOUTH EVERY 6 HOURS AS NEEDED FOR WHEEZING OR SHORTNESS OF BREATH 10/29/22   Morene Crocker, MD  amiodarone (PACERONE) 200 MG tablet Take 2 tablets (400 mg total) by mouth 2 (two) times daily for 14 days, THEN 2 tablets (400 mg total) daily for 14 days, THEN 1 tablet (200 mg total) daily. 07/31/22  08/23/23  Duke SalviaKlein, Steven C, MD  aspirin EC 81 MG tablet Take 1 tablet (81 mg total) by mouth daily. Swallow whole. 01/29/22 01/30/23  Jacklynn GanongMilford, Jessica M, FNP  bismuth subsalicylate (PEPTO BISMOL) 262 MG/15ML suspension Take 30 mLs by mouth every 6 (six) hours as needed for indigestion. Patient not taking: Reported on 07/18/2022    [provider]  BREO ELLIPTA 100-25 MCG/ACT AEPB Inhale 1 puff into the lungs daily. 08/02/22 08/02/23  Morene CrockerGomez-Caraballo, Maria, MD  carvedilol (COREG) 3.125 MG tablet Take 1 tablet (3.125 mg total) by mouth 2 (two) times daily. 01/29/22 01/29/23  Jacklynn GanongMilford, Jessica M, FNP  Cholecalciferol (VITAMIN D3) 50 MCG (2000 UT) TABS Take 1 tablet by mouth daily.    [provider]  digoxin (LANOXIN) 0.125 MG tablet TAKE 1 TABLET (0.125 MG TOTAL) BY MOUTH DAILY. 04/26/21   Laurey MoraleMcLean, Dalton S, MD  JARDIANCE 10 MG TABS tablet TAKE 1 TABLET (10 MG TOTAL) BY MOUTH DAILY (AM) 10/01/22   Morene CrockerGomez-Caraballo, Maria, MD  mexiletine (MEXITIL) 150 MG capsule Take 1 capsule (150 mg total) by mouth 2 (two) times daily. 07/31/22   Duke SalviaKlein, Steven C, MD  rosuvastatin (CRESTOR) 5 MG tablet TAKE 1 TABLET (5 MG TOTAL) BY MOUTH DAILY. 12/26/20   Laurey MoraleMcLean, Dalton S, MD  spironolactone (ALDACTONE) 25 MG tablet Take 1 tablet (25 mg total) by mouth daily. 04/24/22   Laurey MoraleMcLean, Dalton S, MD  tamsulosin (FLOMAX) 0.4 MG CAPS capsule TAKE ONE CAPSULE BY MOUTH ONCE DAILY AFTER SUPPER 02/20/21   Belva AgeeKatsadouros, Vasilios, MD  torsemide (DEMADEX) 20 MG tablet Take 1 tablet (20 mg total) by mouth daily as needed (extra fluid). 04/24/22   Laurey MoraleMcLean, Dalton S, MD  umeclidinium bromide (INCRUSE ELLIPTA) 62.5 MCG/ACT AEPB Inhale 1 puff into the lungs daily. 06/17/22   Masters, Florentina AddisonKatie, DO    Past Medical History: Past Medical History:  Diagnosis Date   Arthritis    "feels like it in my legs" (09/20/2014)   CAD (coronary artery disease)    Chronic kidney disease, stage 3a    Chronic systolic CHF (congestive heart failure)    Cocaine abuse    Hypertension    NICM (nonischemic cardiomyopathy)    NSVT (nonsustained ventricular tachycardia) 09/06/2016   pre diabetes    patient denies    Past Surgical History: Past Surgical History:  Procedure Laterality Date   BIV ICD INSERTION CRT-D N/A 07/17/2022   Procedure: BIV ICD INSERTION CRT-D;  Surgeon: Duke SalviaKlein, Steven C, MD;  [facility];  Service: Cardiovascular;  Laterality: N/A;   CARDIAC CATHETERIZATION N/A 08/27/2016   Procedure: Right/Left Heart Cath and Coronary Angiography;  Surgeon: Laurey Moralealton S McLean, MD;  [facility];  Service: Cardiovascular;  Laterality: N/A;   COLONOSCOPY     CYSTECTOMY Right    "back of my shoulder"    RIGHT/LEFT HEART CATH AND CORONARY ANGIOGRAPHY N/A 12/04/2020   Procedure: RIGHT/LEFT HEART CATH AND CORONARY ANGIOGRAPHY;  Surgeon: Laurey MoraleMcLean, Dalton S, MD;  [facility];  Service: Cardiovascular;  Laterality: N/A;   RIGHT/LEFT HEART CATH AND CORONARY ANGIOGRAPHY N/A 10/10/2021   Procedure: RIGHT/LEFT HEART CATH AND CORONARY ANGIOGRAPHY;  Surgeon: Laurey MoraleMcLean, Dalton S, MD;  [facility];  Service: Cardiovascular;  Laterality: N/A;   TONSILLECTOMY      Family History: Family History  Problem Relation Age of Onset   Stroke Mother 3870   Heart disease Father 3470   Colon cancer Neg Hx    Colon  polyps Neg Hx    Esophageal cancer Neg Hx    Stomach cancer Neg Hx    Rectal cancer Neg Hx     Social History: Social History   Socioeconomic History   Marital status: Married    Spouse name: Not on file   Number of children: Not on file   Years of education: Not on file   Highest education level: Not on file  Occupational History   Occupation: Special educational needs teacher    Comment: has not been able to work steadily for the last year or two  Tobacco Use   Smoking status: Every Day    Packs/day: 0.10    Years: 48.00    Additional pack years: 0.00    Total pack years: 4.80    Types: Cigarettes   Smokeless tobacco: Never   Tobacco comments:    cutting back 2  per day.  No cigs  Vaping Use   Vaping Use: Never used  Substance and Sexual Activity   Alcohol use: Yes    Alcohol/week: 1.0 standard drink of alcohol    Types: 1 Cans of beer per week    Comment: occasionally   Drug use: Yes    Types: Marijuana    Comment: daily 4 times last 2 weeks ago as of 05/31/2019   Sexual activity: Yes    Birth control/protection: None  Other Topics Concern   Not on file  Social History Narrative   Not on file   Social Determinants of Health   Financial Resource Strain: Low Risk  (06/17/2022)   Overall Financial Resource Strain (CARDIA)    Difficulty of Paying Living Expenses: Not  hard at all  Food Insecurity: No Food Insecurity (06/17/2022)   Hunger Vital Sign    Worried About Running Out of Food in the Last Year: Never true    Ran Out of Food in the Last Year: Never true  Recent Concern: Food Insecurity - Food Insecurity Present (06/08/2022)   Hunger Vital Sign    Worried About Running Out of Food in the Last Year: Sometimes true    Ran Out of Food in the Last Year: Sometimes true  Transportation Needs: No Transportation Needs (06/17/2022)   PRAPARE - Administrator, Civil Service (Medical): No    Lack of Transportation (Non-Medical): No  Recent Concern: Transportation Needs - Unmet Transportation Needs (06/08/2022)   PRAPARE - Administrator, Civil Service (Medical): Yes    Lack of Transportation (Non-Medical): No  Physical Activity: Inactive (06/17/2022)   Exercise Vital Sign    Days of Exercise per Week: 0 days    Minutes of Exercise per Session: 0 min  Stress: No Stress Concern Present (06/17/2022)   [employer] of Occupational Health - Occupational Stress Questionnaire    Feeling of Stress : Not at all  Social Connections: Moderately Isolated (06/17/2022)   Social Connection and Isolation Panel [NHANES]    Frequency of Communication with Friends and Family: More than three times a week    Frequency of Social Gatherings with Friends and Family: More than three times a week    Attends Religious Services: Never    Database administrator or Organizations: No    Attends Banker Meetings: Never    Marital Status: Married    Allergies:  No Known Allergies  Objective:    Vital Signs:   Temp:  [96.9 F (36.1 C)-100.8 F (38.2 C)] 98.8 F (37.1 C) (04/08 0651) Pulse  Rate:  [35-125] 65 (04/08 0800) Resp:  [13-30] 22 (04/08 0800) BP: (76-116)/(44-80) 116/67 (04/08 0800) SpO2:  [77 %-100 %] 99 % (04/08 0800) Arterial Line BP: (82-199)/(47-100) 126/76 (04/08 0730) FiO2 (%):  [40 %-100 %] 40 % (04/08 0800) Weight:   [90 kg-93 kg] 90 kg (04/08 0500)    Weight change: Filed Weights   11/17/22 1403 11/18/22 0500  Weight: 93 kg 90 kg    Intake/Output:   Intake/Output Summary (Last 24 hours) at 11/18/2022 0839 Last data filed at 11/18/2022 0603 Gross per 24 hour  Intake 1867.45 ml  Output 1065 ml  Net 802.45 ml    CVP 8   Physical Exam    General:  Intubated HEENT: ETT Neck: supple. JVP 8-9  Carotids 2+ bilat; no bruits. No lymphadenopathy or thyromegaly appreciated. Cor: PMI nondisplaced. Regular rate & rhythm. No rubs, gallops or murmurs. Lungs: clear Abdomen: soft, nontender, nondistended. No hepatosplenomegaly. No bruits or masses. Good bowel sounds. Extremities: no cyanosis, clubbing, rash, edema. Left radial artlial. R fem Central line.   Neuro: Intubated  Telemetry    SR AV paced 60s   EKG  SR AV paced QRS 212   Labs   Basic Metabolic Panel: Recent Labs  Lab 11/17/22 1416 11/17/22 1433 11/17/22 1455 11/17/22 1545 11/17/22 1749 11/17/22 2102 11/18/22 0130 11/18/22 0648  NA 138   < > 137  --  140 140 137 139  K 6.1*   < > 4.8  --  4.2 3.9 3.9 4.0  CL 105  --   --   --   --   --  104  --   CO2  --   --   --   --   --   --  20*  --   GLUCOSE 233*  --   --   --   --   --  123*  --   BUN 30*  --   --   --   --   --  20  --   CREATININE 2.00*  --   --  2.10*  --   --  1.63*  --   CALCIUM  --   --   --   --   --   --  8.1*  --   MG  --   --   --   --   --   --  1.6*  --   PHOS  --   --   --   --   --   --  3.0  --    < > = values in this interval not displayed.    Liver Function Tests: Recent Labs  Lab 11/18/22 0130  AST 43*  ALT 33  ALKPHOS 103  BILITOT 1.1  PROT 6.7  ALBUMIN 2.7*   No results for input(s): "LIPASE", "AMYLASE" in the last 168 hours. No results for input(s): "AMMONIA" in the last 168 hours.  CBC: Recent Labs  Lab 11/17/22 1405 11/17/22 1416 11/17/22 1433 11/17/22 1455 11/17/22 1749 11/17/22 2102 11/18/22 0648  WBC 12.3*  --   --   --    --   --   --   NEUTROABS 4.9  --   --   --   --   --   --   HGB 16.0   < > 17.3* 15.6 15.6 15.6 14.6  HCT 53.6*   < > 51.0 46.0 46.0 46.0 43.0  MCV 89.9  --   --   --   --   --   --  PLT 153  --   --   --   --   --   --    < > = values in this interval not displayed.    Cardiac Enzymes: No results for input(s): "CKTOTAL", "CKMB", "CKMBINDEX", "TROPONINI" in the last 168 hours.  BNP: BNP (last 3 results) Recent Labs    04/04/22 1202 06/07/22 0142  BNP 212.5* 2,548.2*    ProBNP (last 3 results) No results for input(s): "PROBNP" in the last 8760 hours.   CBG: Recent Labs  Lab 11/17/22 1746 11/17/22 2101 11/17/22 2354 11/18/22 0525 11/18/22 0810  GLUCAP 116* 132* 123* 121* 130*    Coagulation Studies: No results for input(s): "LABPROT", "INR" in the last 72 hours.   Imaging   DG Chest 1 View  Result Date: 11/18/2022 CLINICAL DATA:  Respiratory failure. EXAM: CHEST  1 VIEW COMPARISON:  11/17/2022 FINDINGS: ETT tip is stable above the carina. Enteric tube tip courses below the GE junction. Left chest wall ICD noted with leads in the right atrial appendage, coronary sinus and right ventricle. Cardiomediastinal contours are unremarkable. Bilateral interstitial and airspace have improved when compared with the previous exam. No new findings. IMPRESSION: Previous bilateral interstitial and airspace opacities have improved in the interval. Electronically Signed   By: Signa Kell M.D.   On: 11/18/2022 06:33   CT CHEST ABDOMEN PELVIS W CONTRAST  Result Date: 11/17/2022 CLINICAL DATA:  Status post cardiac arrest.  Unresponsive. EXAM: CT CHEST, ABDOMEN, AND PELVIS WITH CONTRAST TECHNIQUE: Multidetector CT imaging of the chest, abdomen and pelvis was performed following the standard protocol during bolus administration of intravenous contrast. RADIATION DOSE REDUCTION: This exam was performed according to the departmental dose-optimization program which includes automated exposure  control, adjustment of the mA and/or kV according to patient size and/or use of iterative reconstruction technique. CONTRAST:  75mL OMNIPAQUE IOHEXOL 350 MG/ML SOLN COMPARISON:  Portable chest obtained earlier today. Abdomen and pelvis CT dated 06/11/2022. Chest CT dated 10/05/2021. FINDINGS: CT CHEST FINDINGS Cardiovascular: Progressive enlargement of the heart. Atheromatous calcifications, including the coronary arteries and aorta. Intracardiac pacemaker leads and epicardial lead. No aortic aneurysm, dissection or gross pulmonary arterial filling defects. Mediastinum/Nodes: Nasogastric tube extending into the stomach. Endotracheal tube its tip 2.7 cm above the carina. No enlarged lymph nodes. Unremarkable thyroid gland. Lungs/Pleura: Extensive patchy and nodular airspace opacities in both lungs as well as extensive ground-glass interstitial opacities. Minimal bilateral pleural fluid. No pneumothorax. Mild bullous changes in the medial right upper lobe. Musculoskeletal: Left anterior 3rd, 4th and 5th rib fractures without significant displacement. Right 2nd, 3rd and 7th anterior rib fractures without significant displacement. Possible minimally angulated right 4th, 5th and 6th rib fractures without visible fracture lines. Similar deformity involving the left anterior 6th rib. Fracture of the anterior cortex of the distal sternum. Thoracic and lower cervical spine degenerative changes including changes of DISH. CT ABDOMEN PELVIS FINDINGS Hepatobiliary: No focal liver abnormality is seen. No gallstones, gallbladder wall thickening, or biliary dilatation. Pancreas: Unremarkable. No pancreatic ductal dilatation or surrounding inflammatory changes. Spleen: Normal in size without focal abnormality. Adrenals/Urinary Tract: 3 mm mid to lower right renal calculus. 1.2 cm heterogeneous low and medium density right adrenal nodule without significant change since 06/11/2022 previously shown represent a benign adenoma.  Unremarkable left adrenal gland, left kidney and ureters. Foley catheter in the urinary bladder with a small amount of associated air and no significant urine in the bladder. Markedly enlarged prostate gland protruding into the bladder base. Stomach/Bowel:  Nasogastric tube tip in the lateral aspect of the body of the stomach on the left. Mild colonic diverticulosis without evidence of diverticulitis. Normal-appearing appendix. Unremarkable small bowel. Vascular/Lymphatic: Atheromatous arterial calcifications without aneurysm. No enlarged lymph nodes. Reproductive: Markedly enlarged, heterogeneous prostate gland protruding into the bladder base. Other: No free peritoneal fluid or air. Musculoskeletal: Mild bilateral hip degenerative changes. Interbody and pedicle screw and rod fixation at the L3 through S1 levels normal alignment. Mild degenerative changes at the L2-3 level. IMPRESSION: 1. Extensive patchy and nodular airspace opacities in both lungs as well as extensive ground-glass interstitial opacities. This could be due to pulmonary edema, pneumonia and/or pulmonary contusions. 2. Minimal bilateral pleural fluid. 3. Progressive cardiomegaly. 4. Multiple bilateral anterior rib fractures without significant displacement. 5. Fracture of the anterior cortex of the distal sternum. 6. No acute abnormality in the abdomen or pelvis. 7. 3 mm nonobstructing right renal calculus. 8. Markedly enlarged, heterogeneous prostate gland protruding into the bladder base. 9. Mild colonic diverticulosis. 10. Calcific coronary artery and aortic atherosclerosis. 11. 1.2 cm heterogeneous low and medium density right adrenal nodule without significant change since 06/11/2022 previously shown to represent a benign adenoma. This does not need imaging follow-up. Aortic Atherosclerosis (ICD10-I70.0). Electronically Signed   By: Beckie Salts M.D.   On: 11/17/2022 16:38   CT Head Wo Contrast  Result Date: 11/17/2022 CLINICAL DATA:   Provided history: Delirium. Post arrest, unresponsive. EXAM: CT HEAD WITHOUT CONTRAST TECHNIQUE: Contiguous axial images were obtained from the base of the skull through the vertex without intravenous contrast. RADIATION DOSE REDUCTION: This exam was performed according to the departmental dose-optimization program which includes automated exposure control, adjustment of the mA and/or kV according to patient size and/or use of iterative reconstruction technique. COMPARISON:  Prior head CT examinations 06/10/2019 and earlier. FINDINGS: Brain: Redemonstrated chronic lacunar infarcts within the right basal ganglia and left thalamocapsular junction. Background moderate patchy and ill-defined hypoattenuation within the cerebral white matter, nonspecific but compatible with chronic small-vessel ischemic disease. There is no acute intracranial hemorrhage. No demarcated cortical infarct. No extra-axial fluid collection. No evidence of an intracranial mass. No midline shift. Vascular: No hyperdense vessel.  Atherosclerotic calcifications. Skull: No fracture or aggressive osseous lesion. Sinuses/Orbits: No mass or acute finding within the imaged orbits. Mild mucosal thickening within the right maxillary sinus. 12 mm mucous retention cyst, and minimal background mucosal thickening, within the left maxillary sinus. Mild mucosal thickening, and small-volume fluid, scattered within bilateral ethmoid air cells. Trace mucosal thickening within the right frontal sinus. Other: Partially imaged life-support tubes. IMPRESSION: 1. No CT evidence of an acute intracranial abnormality. Please note, a brain MRI would have greater sensitivity for acute hypoxic/ischemic injury. 2. Parenchymal atrophy and chronic small vessel ischemic disease, as described. 3. Paranasal sinus disease, as detailed. Electronically Signed   By: Jackey Loge D.O.   On: 11/17/2022 16:36   DG Chest Portable 1 View  Result Date: 11/17/2022 CLINICAL DATA:   Intubation EXAM: PORTABLE CHEST 1 VIEW COMPARISON:  07/17/2022 FINDINGS: Endotracheal tube terminates approximately 4.3 cm above the carina. Enteric tube courses below the diaphragm with distal tip beyond the inferior margin of the film. Cardiomegaly. Diffuse bilateral interstitial and alveolar airspace opacities, left slightly worse than right. No large pleural fluid collection. No pneumothorax. Left-sided implanted cardiac device remains in place. Overlying cardiac leads. IMPRESSION: 1. Endotracheal tube terminates approximately 4.3 cm above the carina. 2. Diffuse bilateral interstitial and alveolar airspace opacities, left slightly worse than right. Electronically Signed  By: Duanne Guess D.O.   On: 11/17/2022 14:39     Medications:     Current Medications:  acetaminophen  650 mg Oral Q4H   Or   acetaminophen (TYLENOL) oral liquid 160 mg/5 mL  650 mg Per Tube Q4H   Or   acetaminophen  650 mg Rectal Q4H   amiodarone  200 mg Per Tube Daily   arformoterol  15 mcg Nebulization BID   aspirin  325 mg Per Tube Daily   budesonide (PULMICORT) nebulizer solution  0.25 mg Nebulization BID   Chlorhexidine Gluconate Cloth  6 each Topical Daily   docusate  100 mg Per Tube BID   famotidine  20 mg Per Tube BID   heparin  5,000 Units Subcutaneous Q8H   mouth rinse  15 mL Mouth Rinse Q2H   pantoprazole (PROTONIX) IV  40 mg Intravenous QHS   polyethylene glycol  17 g Per Tube Daily   revefenacin  175 mcg Nebulization Daily    Infusions:  sodium chloride Stopped (11/17/22 2206)   ampicillin-sulbactam (UNASYN) IV Stopped (11/18/22 0539)   epinephrine 4 mcg/min (11/18/22 0600)   fentaNYL infusion INTRAVENOUS 150 mcg/hr (11/18/22 0600)   norepinephrine (LEVOPHED) Adult infusion 8 mcg/min (11/18/22 0603)   propofol (DIPRIVAN) infusion 40 mcg/kg/min (11/18/22 0600)      Patient Profile   Edward Mullen is 67 year old with a history of HTN, DMI. CAD, VT, LBBB, BiV ICD, CKD Stage IIIa, polysubstance  abuse, NIMC, and HFrEF. Followed in the HF clinic several years.    Admitted PEA arrest c/b cardiogenic shock.   Assessment/Plan   1. Out of hospital PEA arrest-->Cardiogenic Shock  Found down. Unwitnessed. EMS rhythm PEA. CPR ROSC ~ 8 minutes.  Lactic acid >9-->today 2.1   - On Norepi 8 mcg + Epi 2 mcg.  - Check CO-OX   2. Acute Respiratory Failure  -Intubated in ED . Remains intubated FiO2 40% - Getting Unasyn for possible aspiration pneumonia.   3. Chronic HFrEf, NICM -Suspected HTN/Cocaine Abuse. Has BiV -Today Echo EF 20-25% RV moderately reduced.  - Holding GDMT due to pressor requirements.  - CVP 8. CXR looks a little wet.  Give 40 mg IV lasix today.   4. AKI -Creatinine baseline ~ 1.5  -Admit creatinine 2.1-->1.6  -Follow daily BMET.   5. NSVT  K 3.9.  Mag 1.6 Give 4 grams Mag  today   6. Polysubstance Abuse UDS + cocaine + Benz+ THC  7. Hyperkalemia  K 6.1 on admit. Given   Follow BMET.    Length of Stay: 1  Amy Clegg, NP  11/18/2022, 8:39 AM  Advanced Heart Failure Team Pager [phone] (M-F; 7a - 5p)  Please contact [facility] for night-coverage after hours (4p -7a ) and weekends on [email or web address]  Agree with above.   Patient with h/o severe NICM. Admitted with PEA arrest. Remains intubated/sedated. Will respond to pain. On NE/EPI. Currently AV paced.   Echo at bedside EF 20-25% RV moderately to severely down  General:  Intubated  HEENT: + ETT Neck: supple. no JVD. Carotids 2+ bilat; no bruits. No lymphadenopathy or thryomegaly appreciated. ONG:EXBMWUX rate & rhythm. No rubs, gallops or murmurs. Lungs: clear Abdomen: soft, nontender, nondistended. No hepatosplenomegaly. No bruits or masses. Good bowel sounds. Extremities: no cyanosis, clubbing, rash, edema Neuro: responds to pain   He is s/p PEA arrest in setting of ongoing poylsubstance use. EF stable at 20-25% by echo this am. According to PharmD team has been complaint  with refilling his HF  meds.   Would continue supportive care. Wean pressors and vent as tolerated. Will give IV lasix today.   CRITICAL CARE Performed by: Arvilla Meres  Total critical care time: 45 minutes  Critical care time was exclusive of separately billable procedures and treating other patients.  Critical care was necessary to treat or prevent imminent or life-threatening deterioration.  Critical care was time spent personally by me (independent of midlevel providers or residents) on the following activities: development of treatment plan with patient and/or surrogate as well as nursing, discussions with consultants, evaluation of patient's response to treatment, examination of patient, obtaining history from patient or surrogate, ordering and performing treatments and interventions, ordering and review of laboratory studies, ordering and review of radiographic studies, pulse oximetry and re-evaluation of patient's condition.  Arvilla Meres, MD  10:16 AM

## 2022-11-18 NOTE — Progress Notes (Signed)
NAME:  Edward Mullen, MRN:  [id number], DOB:  1955-09-05, LOS: 1 ADMISSION DATE:  11/17/2022, CONSULTATION DATE:  11/17/2022 REFERRING MD:  Kommor - EDP, CHIEF COMPLAINT:  cardiac arrest   History of Present Illness:  67 year-old man patient with a known history of nonischemic-cardiomyopathy status post ICD and pacemaker.  Followed in the outpatient setting by [facility].  Presents to the emergency room via EMS after being found unresponsive with agonal respiratory status in a parking lot.  His pupils were pinpoint.  He was given intramuscular Narcan by EMS.  On EMS arrival he was noted to be in PEA rhythm and CPR was initiated, time to return of spontaneous circulation estimated at 8 minutes.  On arrival to the emergency room he was agitated, coughing, reaching, moving all extremities but in no acute distress.  He was intubated for airway protection, subsequently became severely hypotensive.  Had copious amounts of frothy bloody sputum from endotracheal tube.  Central venous access was placed as was arterial access.  He was started on norepinephrine and then later epinephrine infusion on critical care arrival critical care was asked to admit.  Pertinent Medical History:   Past Medical History:  Diagnosis Date   Arthritis    "feels like it in my legs" (09/20/2014)   CAD (coronary artery disease)    Chronic kidney disease, stage 3a    Chronic systolic CHF (congestive heart failure)    Cocaine abuse    Hypertension    NICM (nonischemic cardiomyopathy)    NSVT (nonsustained ventricular tachycardia) 09/06/2016   pre diabetes    patient denies   Significant Hospital Events: Including procedures, antibiotic start and stop dates in addition to other pertinent events   4/7 Admitted status post 8-minute cardiac arrest initial rhythm PEA/paced. Narcan given by EMS. Intubated in the emergency room, subsequent shock. HF consulted. UDS + cocaine, BZDs, THC. Trops mildly elevated. LA peaked >  9.0. Resp Cx few gram+ cocci (pairs, clusters), rare GNRs. 4/8 Runs of VT overnight, nonsustained + PVCs. Electrolytes repleted. +Rhinovirus on RVP. Echo EF 20-25%, RV moderately reduced. Co-ox 74%.  Interim History / Subjective:  Runs of NSVT as above, lytes repleted (Mg) No other significant overnight events RVP + rhinovirus Echo with EF 20-25%, RV moderately reduced, Co-ox 74% CXR wet, Lasix 40mg  IV x 1 given Moving BUE purposefully toward tube when stimulated/sedation weaned EEG with severe diffuse encephalopathy r/t sedation, no seizures Weaning sedation as able to try WUA/SBT  Objective:  Blood pressure 116/67, pulse 65, temperature 98.8 F (37.1 C), resp. rate (!) 22, height 6' (1.829 m), weight 90 kg, SpO2 99 %. CVP:  [5 mmHg-24 mmHg] 24 mmHg  Vent Mode: PRVC FiO2 (%):  [40 %-100 %] 40 % Set Rate:  [18 bmp-22 bmp] 22 bmp Vt Set:  [620 mL-630 mL] 620 mL Pressure Support:  [8 cmH20-10 cmH20] 8 cmH20 Plateau Pressure:  [22 cmH20-24 cmH20] 24 cmH20   Intake/Output Summary (Last 24 hours) at 11/18/2022 1023 Last data filed at 11/18/2022 0603 Gross per 24 hour  Intake 1867.45 ml  Output 1065 ml  Net 802.45 ml    Filed Weights   11/17/22 1403 11/18/22 0500  Weight: 93 kg 90 kg   Physical Examination: General: Acutely ill-appearing middle-aged man in NAD. Intubated, sedated. HEENT: [place]/AT, anicteric sclera, pupils pinpoint 35mm on sedation, moist mucous membranes. ETT/OG in place. Neuro: Sedated. Responds to noxious stimuli. Not following commands. Moves BUE spontaneously.+Corneal, +Cough, and +Gag  CV: Mildly bradycardic, regular  rhythm, no m/g/r. PULM: Breathing even and unlabored on vent (PEEP 8, FiO2 40%). Lung fields diminished at bilateral bases, ?fine crackles on L. GI: Soft, nontender, nondistended. Normoactive bowel sounds. Extremities: No LE edema noted. Skin: Warm/dry, no rashes.  Resolved Hospital Problem List:    Assessment & Plan:  Cardiac arrest.  PEA/with  paced rhythm.  Time resuscitation estimated at 8 minutes - Monitoring in ICU s/p cardiac arrest - Normothemia protocol - Cardiac monitoring - Echo with EF 20-25% - UDS +cocaine, BZDs, THC - EP interrogated PPM with no events recorded  Cardiogenic shock status postcardiac arrest, with known history of underlying nonischemic cardiomyopathy Mild leukocytosis Lactic acidosis in the setting of shock, resolving Patient has a known underlying cardiomyopathy with a EF of 20 to 25%.  CT Chest/A/P demonstrated extensive GGOs bilaterally, ?pulmonary edema vs. contusions; progressive cardiomegaly, multiple rib fractures/distal sternum fracture. - Goal MAP > 65 - Limited fluid resuscitation in the setting of severe HF - Levophed, Epi titrated to goal MAP - Trend Co-ox, CVP - Trend WBC, fever curve, LA - F/u Cx data (pending) - Continue broad-spectrum antibiotics  Acute hypoxic respiratory failure status postcardiac arrest and complicated by diffuse pulmonary edema Possible aspiration PNA History of COPD - Continue full vent support (4-8cc/kg IBW) - Wean FiO2 for O2 sat > 90% - Daily WUA/SBT as mental status tolerates - VAP bundle - Bronchodilators (Brovana/Yupelri, Pulmicort) - Diuresis as hemodynamics tolerate - Pulmonary hygiene - PAD protocol for sedation: Propofol and Fentanyl for goal RASS 0 to -1 - Continue Unasyn for possible aspiration  Acute metabolic and possibly toxic encephalopathy.  Initial concern for possible overdose given response to Narcan, however patient has significant cardiac history and sudden cardiac arrest is certainly a possibility.  Given downtime concern for anoxic brain injury - EEG 4/8 with severe diffuse encephalopathy, no seizures - CT Head NAICA, parenchymal atrophy/chronic small vessel disease - Neuroprotective measures: HOB > 30 degrees, normoglycemia, normothermia, electrolytes WNL  Acute on chronic renal failure.  Baseline CKD stage  IIIa Hyperkalemia Chemically shifted x 1 4/7PM. CT A/P with 53mm nonobstructing renal calculus (R). - Trend BMP - Replete electrolytes as indicated - Monitor I&Os - Avoid nephrotoxic agents as able - Ensure adequate renal perfusion  Enlarged prostate CT A/P 4/7 with markedly enlarged prostate gland protruding into bladder base. - PSA 2.32 (stable from 1 year prior) - Outpatient f/u   Polysubstance abuse UDS + cocaine, BZD, THC. - Encourage cessation, TOC committee consult if appropriate  Best Practice (right click and "Reselect all SmartList Selections" daily)   Diet/type: NPO w/ meds via tube DVT prophylaxis: SCD GI prophylaxis: PPI Lines: Central line Foley:  Yes, and it is still needed Code Status:  full code Last date of multidisciplinary goals of care discussion [pending ]  Critical care time:    The patient is critically ill with multiple organ system failure and requires high complexity decision making for assessment and support, frequent evaluation and titration of therapies, advanced monitoring, review of radiographic studies and interpretation of complex data.   Critical Care Time devoted to patient care services, exclusive of separately billable procedures, described in this note is 41 minutes.  Tim Lair, PA-C [place] [facility] 11/18/22 10:24 AM  Please see [email or web address] for pager details.  From 7A-7P if no response, please call [phone] After hours, please call ELink [phone]

## 2022-11-18 NOTE — Progress Notes (Signed)
  Echocardiogram 2D Echocardiogram has been performed.  Milda Smart 11/18/2022, 9:53 AM

## 2022-11-18 NOTE — Progress Notes (Signed)
eLink Physician-Brief Progress Note Patient Name: Edward Mullen DOB: 03-06-1956 MRN: [id number]   Date of Service  11/18/2022  HPI/Events of Note  67 year old male that presented after cardiac arrest with respiratory failure multifocal infiltrates on the ventilator, adequately sedated, central line in place with CVP of 6.  Called about runs of VT.    Telemetry alarms reviewed, mostly PVCs with nonsustained ventricular tachycardia.  Electrolytes appropriately repleted.  AKI improving.  No evidence of acidosis. + Rhinovirus on pathogen panel  eICU Interventions  Continue to observe for now     Intervention Category Intermediate Interventions: Arrhythmia - evaluation and management  Arpita Fentress 11/18/2022, 6:56 AM

## 2022-11-18 NOTE — Progress Notes (Signed)
[facility] ADULT ICU REPLACEMENT PROTOCOL   The patient does apply for the [facility] Adult ICU Electrolyte Replacment Protocol based on the criteria listed below:   1.Exclusion criteria: TCTS, ECMO, Dialysis, and Myasthenia Gravis patients 2. Is GFR >/= 30 ml/min? Yes.    Patient's GFR today is 46 3. Is SCr </= 2? Yes.   Patient's SCr is 1,63 mg/dL 4. Did SCr increase >/= 0.5 in 24 hours? No. 5.Pt's weight >40kg  Yes.   6. Abnormal electrolyte(s): Mag 1.6  7. Electrolytes replaced per protocol 8.  Call MD STAT for K+ </= 2.5, Phos </= 1, or Mag </= 1 Physician:  Dr. Mayra Reel, Lilia Argue 11/18/2022 2:39 AM

## 2022-11-18 NOTE — TOC Initial Note (Signed)
Transition of Care [facility]) - Initial/Assessment Note    Patient Details  Name: Edward Mullen MRN: [id number] Date of Birth: Jan 15, 1956  [facility] CM/SW Contact:    [name] Cousin, RN Phone Number: [phone] 11/18/2022, 4:57 PM  Clinical Narrative:                 CM spoke to dtr, [name], states pt is staying in her home on the couch. Pt got behind on rent and was not able to pay up. Pt was staying in housing and believes he can get his apt back but he is not able to live alone. Pt has a cane and neb machine at home. Dtr agreeable to SNF rehab or IP rehab. Will continue to follow for dc needs.   Expected Discharge Plan: Skilled Nursing Facility Barriers to Discharge: Continued Medical Work up   Patient Goals and CMS Choice            Expected Discharge Plan and Services   Discharge Planning Services: CM Consult   Living arrangements for the past 2 months: Single Family Home                                      Prior Living Arrangements/Services Living arrangements for the past 2 months: Single Family Home Lives with:: Adult Children Patient language and need for interpreter reviewed:: Yes              Criminal Activity/Legal Involvement Pertinent to Current Situation/Hospitalization: No - Comment as needed  Activities of Daily Living      Permission Sought/Granted Permission sought to share information with : Case Manager, Family Supports Permission granted to share information with : Yes, Verbal Permission Granted  Share Information with NAME: Vangie Bicker     Permission granted to share info w Relationship: daughter  Permission granted to share info w Contact Information: [phone]  Emotional Assessment   Attitude/Demeanor/Rapport: Intubated (Following Commands or Not Following Commands)     Alcohol / Substance Use: Illicit Drugs    Admission diagnosis:  Cardiac arrest [I46.9] Patient Active Problem List    Diagnosis Date Noted   Cardiogenic shock 11/18/2022   Cardiac arrest 11/17/2022   LBBB (left bundle branch block) 10/21/2022   PVC (premature ventricular contraction) frequent 10/21/2022   COPD (chronic obstructive pulmonary disease) 06/07/2022   Hyperlipemia 04/04/2022   First time seizure 12/28/2021   Type 2 diabetes mellitus with stage 3b chronic kidney disease, without long-term current use of insulin 10/05/2021   Hemoptysis    Acute exacerbation of CHF (congestive heart failure) 10/04/2021   Urinary retention    Scrotal mass 08/01/2020   Nocturia 08/01/2020   Lumbar spinal stenosis 06/08/2019   Healthcare maintenance 05/11/2019   Housing problems 05/11/2019   Difficulty urinating 04/07/2018   Colon cancer screening 04/07/2018   Anemia 12/19/2016   CKD (chronic kidney disease) stage 3, GFR 30-59 ml/min 12/13/2016   Acute on chronic systolic heart failure 09/06/2016   Non-ischemic cardiomyopathy 08/27/2016   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 [place] [address] Phone: [phone] Fax:  [phone]  [facility] - [place], [place] - [address] [place] [address] Phone: [phone] Fax: [phone]     Social Determinants of Health (SDOH) Social History: SDOH Screenings   Food Insecurity: No Food Insecurity (06/17/2022)  Recent Concern: Food Insecurity - Food Insecurity Present (06/08/2022)  Housing: Low Risk  (06/17/2022)  Transportation Needs: No Transportation Needs (06/17/2022)  Recent Concern: Transportation Needs - Unmet Transportation Needs (06/08/2022)  Utilities: Not At Risk  (06/17/2022)  Alcohol Screen: Low Risk  (06/17/2022)  Depression (PHQ2-9): Low Risk  (06/17/2022)  Financial Resource Strain: Low Risk  (06/17/2022)  Physical Activity: Inactive (06/17/2022)  Social Connections: Moderately Isolated (06/17/2022)  Stress: No Stress Concern Present (06/17/2022)  Tobacco Use: High Risk (11/17/2022)   SDOH Interventions:     Readmission Risk Interventions     No data to display

## 2022-11-18 NOTE — Procedures (Signed)
Patient Name: ALVEY CHUKWU  MRN: [id number]  Epilepsy Attending: Charlsie Quest  Referring Physician/Provider: Simonne Martinet, NP  Date: 11/18/2022 Duration: 22.16 mins  Patient history: 67yo M s/p cardiac arrest. EEG to evaluate for seizure  Level of alertness: comatose  AEDs during EEG study: Propofol  Technical aspects: This EEG study was done with scalp electrodes positioned according to the 10-20 International system of electrode placement. Electrical activity was reviewed with band pass filter of 1-70Hz , sensitivity of 7 uV/mm, display speed of 72mm/sec with a 60Hz  notched filter applied as appropriate. EEG data were recorded continuously and digitally stored.  Video monitoring was available and reviewed as appropriate.  Description: EEG showed continuous generalized 3 to 6 Hz theta-delta slowing with overriding 15 to 18 Hz beta activity distributed symmetrically and diffusely.   Hyperventilation and photic stimulation were not performed.     ABNORMALITY - Continuous slow, generalized - Excessive beta, generalized  IMPRESSION: This study is suggestive of severe diffuse encephalopathy likely related to sedation. No seizures or epileptiform discharges were seen throughout the recording.  Ashleyann Shoun Annabelle Harman

## 2022-11-19 ENCOUNTER — Inpatient Hospital Stay (HOSPITAL_COMMUNITY): Payer: Medicare HMO

## 2022-11-19 DIAGNOSIS — E440 Moderate protein-calorie malnutrition: Secondary | ICD-10-CM | POA: Insufficient documentation

## 2022-11-19 DIAGNOSIS — R570 Cardiogenic shock: Secondary | ICD-10-CM | POA: Diagnosis not present

## 2022-11-19 DIAGNOSIS — I469 Cardiac arrest, cause unspecified: Secondary | ICD-10-CM | POA: Diagnosis not present

## 2022-11-19 DIAGNOSIS — J9601 Acute respiratory failure with hypoxia: Secondary | ICD-10-CM | POA: Diagnosis not present

## 2022-11-19 LAB — COMPREHENSIVE METABOLIC PANEL
ALT: 35 U/L (ref 0–44)
ALT: 34 U/L (ref 0–44)
AST: 43 U/L — ABNORMAL HIGH (ref 15–41)
AST: 45 U/L — ABNORMAL HIGH (ref 15–41)
Albumin: 2.6 g/dL — ABNORMAL LOW (ref 3.5–5.0)
Albumin: 3.1 g/dL — ABNORMAL LOW (ref 3.5–5.0)
Alkaline Phosphatase: 91 U/L (ref 38–126)
Alkaline Phosphatase: 95 U/L (ref 38–126)
Anion gap: 8 (ref 5–15)
Anion gap: 14 (ref 5–15)
BUN: 21 mg/dL (ref 8–23)
BUN: 23 mg/dL (ref 8–23)
CO2: 21 mmol/L — ABNORMAL LOW (ref 22–32)
CO2: 25 mmol/L (ref 22–32)
Calcium: 8.2 mg/dL — ABNORMAL LOW (ref 8.9–10.3)
Calcium: 8.9 mg/dL (ref 8.9–10.3)
Chloride: 107 mmol/L (ref 98–111)
Chloride: 98 mmol/L (ref 98–111)
Creatinine, Ser: 1.59 mg/dL — ABNORMAL HIGH (ref 0.61–1.24)
Creatinine, Ser: 1.73 mg/dL — ABNORMAL HIGH (ref 0.61–1.24)
GFR, Estimated: 47 mL/min — ABNORMAL LOW (ref >60)
GFR, Estimated: 42 mL/min — ABNORMAL LOW (ref >60)
Glucose, Bld: 133 mg/dL — ABNORMAL HIGH (ref 70–99)
Glucose, Bld: 83 mg/dL (ref 70–99)
Potassium: 4.1 mmol/L (ref 3.5–5.1)
Potassium: 4.3 mmol/L (ref 3.5–5.1)
Sodium: 136 mmol/L (ref 135–145)
Sodium: 137 mmol/L (ref 135–145)
Total Bilirubin: 1.5 mg/dL — ABNORMAL HIGH (ref 0.3–1.2)
Total Bilirubin: 1.2 mg/dL (ref 0.3–1.2)
Total Protein: 6.6 g/dL (ref 6.5–8.1)
Total Protein: 7.9 g/dL (ref 6.5–8.1)

## 2022-11-19 LAB — CBC
HCT: 39.7 % (ref 39.0–52.0)
Hemoglobin: 13.1 g/dL (ref 13.0–17.0)
MCH: 27 pg (ref 26.0–34.0)
MCHC: 33 g/dL (ref 30.0–36.0)
MCV: 81.7 fL (ref 80.0–100.0)
Platelets: 110 10*3/uL — ABNORMAL LOW (ref 150–400)
RBC: 4.86 MIL/uL (ref 4.22–5.81)
RDW: 17.1 % — ABNORMAL HIGH (ref 11.5–15.5)
WBC: 9.4 10*3/uL (ref 4.0–10.5)
nRBC: 0 % (ref 0.0–0.2)

## 2022-11-19 LAB — GLUCOSE, CAPILLARY
Glucose-Capillary: 106 mg/dL — ABNORMAL HIGH (ref 70–99)
Glucose-Capillary: 116 mg/dL — ABNORMAL HIGH (ref 70–99)
Glucose-Capillary: 118 mg/dL — ABNORMAL HIGH (ref 70–99)
Glucose-Capillary: 45 mg/dL — ABNORMAL LOW (ref 70–99)
Glucose-Capillary: 93 mg/dL (ref 70–99)
Glucose-Capillary: 96 mg/dL (ref 70–99)
Glucose-Capillary: 98 mg/dL (ref 70–99)

## 2022-11-19 LAB — CULTURE, RESPIRATORY W GRAM STAIN

## 2022-11-19 LAB — MAGNESIUM
Magnesium: 2.4 mg/dL (ref 1.7–2.4)
Magnesium: 2.3 mg/dL (ref 1.7–2.4)

## 2022-11-19 LAB — LEGIONELLA PNEUMOPHILA SEROGP 1 UR AG: L. pneumophila Serogp 1 Ur Ag: NEGATIVE

## 2022-11-19 LAB — NEURON-SPECIFIC ENOLASE(NSE), BLOOD: Neuron-specific Enolase, Serum: 37.5 ng/mL — ABNORMAL HIGH (ref 0.0–17.6)

## 2022-11-19 LAB — COOXEMETRY PANEL
Carboxyhemoglobin: 3 % — ABNORMAL HIGH (ref 0.5–1.5)
Methemoglobin: <0.7 % (ref 0.0–1.5)
O2 Saturation: 55.7 %
Total hemoglobin: 12.8 g/dL (ref 12.0–16.0)

## 2022-11-19 LAB — PHOSPHORUS: Phosphorus: 3.9 mg/dL (ref 2.5–4.6)

## 2022-11-19 MED ORDER — POLYETHYLENE GLYCOL 3350 17 G PO PACK
17.0000 g | PACK | Freq: Every day | ORAL | Status: DC
  Administered 2022-11-19 – 2022-11-20 (×2): 17 g
  Filled 2022-11-19 (×2): qty 1

## 2022-11-19 MED ORDER — POTASSIUM CHLORIDE 20 MEQ PO PACK
40.0000 meq | PACK | Freq: Two times a day (BID) | ORAL | Status: AC
  Administered 2022-11-19 (×2): 40 meq
  Filled 2022-11-19 (×2): qty 2

## 2022-11-19 MED ORDER — DOCUSATE SODIUM 50 MG/5ML PO LIQD
100.0000 mg | Freq: Two times a day (BID) | ORAL | Status: DC
  Administered 2022-11-19 – 2022-11-20 (×3): 100 mg
  Filled 2022-11-19 (×3): qty 10

## 2022-11-19 MED ORDER — FENTANYL BOLUS VIA INFUSION
25.0000 ug | INTRAVENOUS | Status: DC | PRN
  Administered 2022-11-19 (×4): 50 ug via INTRAVENOUS

## 2022-11-19 MED ORDER — FENTANYL CITRATE PF 50 MCG/ML IJ SOSY: 25.0000 ug | PREFILLED_SYRINGE | Freq: Once | INTRAMUSCULAR | Status: DC

## 2022-11-19 MED ORDER — FUROSEMIDE 10 MG/ML IJ SOLN
80.0000 mg | Freq: Two times a day (BID) | INTRAMUSCULAR | Status: AC
  Administered 2022-11-19 (×2): 80 mg via INTRAVENOUS
  Filled 2022-11-19 (×2): qty 8

## 2022-11-19 MED ORDER — MEXILETINE HCL 150 MG PO CAPS
150.0000 mg | ORAL_CAPSULE | Freq: Two times a day (BID) | ORAL | Status: DC
  Administered 2022-11-19 – 2022-11-20 (×2): 150 mg
  Filled 2022-11-19 (×3): qty 1

## 2022-11-19 NOTE — Progress Notes (Signed)
Remote ICD transmission.   

## 2022-11-19 NOTE — Procedures (Signed)
Arterial Catheter Insertion Procedure Note  SHABAZZ WEIMAN  [id number]  08-08-1956  Date:11/19/22  Time:6:43 PM    Provider Performing: Cheri Fowler    Procedure: Insertion of Arterial Line (19417) with US guidance (40814)   Indication(s) Blood pressure monitoring and/or need for frequent ABGs  Consent Risks of the procedure as well as the alternatives and risks of each were explained to the patient and/or caregiver.  Consent for the procedure was obtained and is signed in the bedside chart  Anesthesia None   Time Out Verified patient identification, verified procedure, site/side was marked, verified correct patient position, special equipment/implants available, medications/allergies/relevant history reviewed, required imaging and test results available.   Sterile Technique Maximal sterile technique including full sterile barrier drape, hand hygiene, sterile gown, sterile gloves, mask, hair covering, sterile ultrasound probe cover (if used).   Procedure Description Area of catheter insertion was cleaned with chlorhexidine and draped in sterile fashion. With real-time ultrasound guidance an arterial catheter was placed into the left  Axillary  artery.  Appropriate arterial tracings confirmed on monitor.     Complications/Tolerance None; patient tolerated the procedure well.   EBL Minimal   Specimen(s) None

## 2022-11-19 NOTE — Progress Notes (Signed)
NAME:  Edward Mullen, MRN:  [id number], DOB:  1955-10-08, LOS: 2 ADMISSION DATE:  11/17/2022, CONSULTATION DATE:  11/17/2022 REFERRING MD:  Kommor - EDP, CHIEF COMPLAINT:  cardiac arrest   History of Present Illness:  67 year-old man patient with a known history of nonischemic-cardiomyopathy status post ICD and pacemaker.  Followed in the outpatient setting by [facility].  Presents to the emergency room via EMS after being found unresponsive with agonal respiratory status in a parking lot.  His pupils were pinpoint.  He was given intramuscular Narcan by EMS.  On EMS arrival he was noted to be in PEA rhythm and CPR was initiated, time to return of spontaneous circulation estimated at 8 minutes.  On arrival to the emergency room he was agitated, coughing, reaching, moving all extremities but in no acute distress.  He was intubated for airway protection, subsequently became severely hypotensive.  Had copious amounts of frothy bloody sputum from endotracheal tube.  Central venous access was placed as was arterial access.  He was started on norepinephrine and then later epinephrine infusion on critical care arrival critical care was asked to admit.  Pertinent Medical History:   Past Medical History:  Diagnosis Date   Arthritis    "feels like it in my legs" (09/20/2014)   CAD (coronary artery disease)    Chronic kidney disease, stage 3a    Chronic systolic CHF (congestive heart failure)    Cocaine abuse    Hypertension    NICM (nonischemic cardiomyopathy)    NSVT (nonsustained ventricular tachycardia) 09/06/2016   pre diabetes    patient denies   Significant Hospital Events: Including procedures, antibiotic start and stop dates in addition to other pertinent events   4/7 Admitted status post 8-minute cardiac arrest initial rhythm PEA/paced. Narcan given by EMS. Intubated in the emergency room, subsequent shock. HF consulted. UDS + cocaine, BZDs, THC. Trops mildly elevated. LA peaked >  9.0. Resp Cx few gram+ cocci (pairs, clusters), rare GNRs. 4/8 Runs of VT overnight, nonsustained + PVCs. Electrolytes repleted. +Rhinovirus on RVP. Echo EF 20-25%, RV moderately reduced. Co-ox 74%. 4/9 Sedation weaned this AM, wide awake/following commands, agitated/uncomfortable. Failed SBT due to tachypnea/low volumes, though this was in the setting of agitation. ?Pain from rib fractures/sternum fracture. Co-ox 55.7% (fem).   Interim History / Subjective:  No significant overnight events Awake, moving all extremities, mildly agitated/uncomfortable Following commands with sedation weaned PIV in which sedation was running infiltrated, unsure how much circulating Attempted SBT on PSV 10/5, tachypneic with low volumes primarily due to agitation Plan to resume sedation and wean slowly, repeat SBT later today  Objective:  Blood pressure 114/76, pulse 60, temperature 98.6 F (37 C), resp. rate (!) 22, height 6' (1.829 m), weight 92.5 kg, SpO2 100 %. CVP:  [2 mmHg-10 mmHg] 5 mmHg  Vent Mode: PRVC FiO2 (%):  [40 %] 40 % Set Rate:  [22 bmp] 22 bmp Vt Set:  [620 mL] 620 mL Pressure Support:  [5 cmH20-8 cmH20] 5 cmH20 Plateau Pressure:  [18 cmH20-24 cmH20] 18 cmH20   Intake/Output Summary (Last 24 hours) at 11/19/2022 0735 Last data filed at 11/19/2022 0700 Gross per 24 hour  Intake 2812.6 ml  Output 4241 ml  Net -1428.4 ml    Filed Weights   11/17/22 1403 11/18/22 0500 11/19/22 0500  Weight: 93 kg 90 kg 92.5 kg   Physical Examination: General: Acutely ill-appearing middle-aged man in NAD. Appears uncomfortable. HEENT: [place]/AT, anicteric sclera, PERRL 58mm, moist mucous membranes.  ETT/OGT in place. Neuro: Awake, opens eyes to voice/attempts to track examiner. Unable to assess orientation. Responds to verbal stimuli. Following commands consistently when sedation is weaned. Moves all 4 extremities spontaneously. Strength 4-5/5 in all 4 extremities. +Corneal, +Cough, and +Gag  CV: Paced in 60s,  no m/g/r. PULM: Breathing even and unlabored on vent in PRVC mode; tachypneic with low lung volumes on PSV 10/5. Lung fields coarse in upper fields, diminished at bases. GI: Soft, nontender, nondistended. Normoactive bowel sounds. Extremities: No significant LE edema noted. Skin: Warm/dry, no rashes.  Resolved Hospital Problem List:    Assessment & Plan:  Cardiac arrest.  PEA/with paced rhythm Time resuscitation estimated at 8 minutes. Echo 4/8 with EF 20-25%. UDS +cocaine, BZDs, THC. EP interrogated PPM with no events recorded. Suspect combination of hypoxic event + cocaine use. - ICU monitoring post-arrest - Normothermia protocol - Cardiac monitoring  Cardiogenic shock status postcardiac arrest, with known history of underlying nonischemic cardiomyopathy Mild leukocytosis Lactic acidosis in the setting of shock, resolving Patient has a known underlying cardiomyopathy with a EF of 20 to 25%.  CT Chest/A/P demonstrated extensive GGOs bilaterally, ?pulmonary edema vs. contusions; progressive cardiomegaly, multiple rib fractures/distal sternum fracture. - Goal MAP > 65 - Limited fluid resuscitation in the setting of severe HF - Levophed titrated to goal MAP, Epi weaned off - Trend Co-ox, CVP - Trend WBC, fever curve - Continue broad-spectrum antibiotics  Acute hypoxic respiratory failure status postcardiac arrest and complicated by diffuse pulmonary edema +Rhinovirus infection Possible aspiration PNA History of COPD - Continue full vent support (4-8cc/kg IBW) - Wean FiO2 for O2 sat > 90% - Daily WUA/SBT as mental status tolerates; suspect he may not tolerate sedation weaning without agitation; may need to pull ETT with some sedation running - VAP bundle - Bronchodilators (Brovana/Yupelri, Pulmicort) - Diuresis as hemodynamics tolerate - Pulmonary hygiene - PAD protocol for sedation: Propofol and Fentanyl for goal RASS 0 to -1 - Unasyn for ?aspiration  Acute metabolic and  possibly toxic encephalopathy.  Initial concern for possible overdose given response to Narcan, however patient has significant cardiac history and sudden cardiac arrest is certainly a possibility.  Given downtime concern for anoxic brain injury. EEG 4/8 with severe diffuse encephalopathy, no seizures.CT Head NAICA, parenchymal atrophy/chronic small vessel disease. - Following commands off of sedation today, 4/9 - Correct metabolic derangements as able - Neuroprotective measures: HOB > 30 degrees, normoglycemia, normothermia, electrolytes WNL  Acute on chronic renal failure.  Baseline CKD stage IIIa Hyperkalemia, improved Chemically shifted x 1 4/7PM. CT A/P with 3mm nonobstructing renal calculus (R). - Trend BMP - Replete electrolytes as indicated - Monitor I&Os - Avoid nephrotoxic agents as able - Ensure adequate renal perfusion  Enlarged prostate CT A/P 4/7 with markedly enlarged prostate gland protruding into bladder base. - PSA 2.32 (stable from 1 year prior) - Outpatient f/u  Polysubstance abuse UDS + cocaine, BZD, THC. - Encourage cessation - TOC consult as appropriate  Best Practice (right click and "Reselect all SmartList Selections" daily)   Diet/type: NPO w/ meds via tube DVT prophylaxis: SCD GI prophylaxis: PPI Lines: Central line Foley:  Yes, and it is still needed Code Status:  full code Last date of multidisciplinary goals of care discussion [pending ]  Critical care time:    The patient is critically ill with multiple organ system failure and requires high complexity decision making for assessment and support, frequent evaluation and titration of therapies, advanced monitoring, review of radiographic studies and interpretation  of complex data.   Critical Care Time devoted to patient care services, exclusive of separately billable procedures, described in this note is 36 minutes.  Tim Lair, PA-C [facility] 11/19/22 7:35  AM  Please see [email or web address] for pager details.  From 7A-7P if no response, please call [phone] After hours, please call ELink [phone]

## 2022-11-19 NOTE — Progress Notes (Addendum)
Advanced Heart Failure Rounding Note  PCP-Cardiologist: None   Subjective:   Yesterday diuresed with IV lasix.   Remains on Norepi 8 mcg. CO-OX 56%  Follows commands on on vent. MAE x4.   Objective:   Weight Range: 92.5 kg Body mass index is 27.66 kg/m.   Vital Signs:   Temp:  [98.4 F (36.9 C)-99 F (37.2 C)] 98.6 F (37 C) (04/09 0700) Pulse Rate:  [54-65] 60 (04/09 0700) Resp:  [18-25] 22 (04/09 0700) BP: (90-134)/(65-89) 114/76 (04/09 0700) SpO2:  [97 %-100 %] 100 % (04/09 0700) Arterial Line BP: (81-134)/(56-84) 113/62 (04/09 0700) FiO2 (%):  [40 %] 40 % (04/09 0328) Weight:  [92.5 kg] 92.5 kg (04/09 0500) Last BM Date :  (Prior to arrival)  Weight change: Filed Weights   11/17/22 1403 11/18/22 0500 11/19/22 0500  Weight: 93 kg 90 kg 92.5 kg    Intake/Output:   Intake/Output Summary (Last 24 hours) at 11/19/2022 0748 Last data filed at 11/19/2022 0700 Gross per 24 hour  Intake 2812.6 ml  Output 4241 ml  Net -1428.4 ml     CVP 9 -10 Physical Exam    General:  Intubated.  HEENT: ETT  Neck: Supple. JVP 9-10 . Carotids 2+ bilat; no bruits. No lymphadenopathy or thyromegaly appreciated. Cor: PMI nondisplaced. Regular rate & rhythm. No rubs, gallops or murmurs. Lungs: Coarse throughout.  Abdomen: Soft, nontender, nondistended. No hepatosplenomegaly. No bruits or masses. Good bowel sounds. Extremities: No cyanosis, clubbing, rash, edema. RFV  L arterial  Neuro: Intubated. Follows commands.  MAE x4  GU: foley  Telemetry   BIV pacing 60s with brief NSVT  EKG    N/A   Labs    CBC Recent Labs    11/17/22 1405 11/17/22 1416 11/18/22 0648 11/19/22 0337  WBC 12.3*  --   --  9.4  NEUTROABS 4.9  --   --   --   HGB 16.0   < > 14.6 13.1  HCT 53.6*   < > 43.0 39.7  MCV 89.9  --   --  81.7  PLT 153  --   --  110*   < > = values in this interval not displayed.   Basic Metabolic Panel Recent Labs    16/05/9603/08/24 0130 11/18/22 0648 11/18/22 1711  11/19/22 0337  NA 137   < > 136 136  K 3.9   < > 4.4 4.1  CL 104  --  100 107  CO2 20*  --  20* 21*  GLUCOSE 123*  --  102* 133*  BUN 20  --  20 21  CREATININE 1.63*  --  1.70* 1.59*  CALCIUM 8.1*  --  8.5* 8.2*  MG 1.6*  --  2.5*  --   PHOS 3.0  --  2.7  --    < > = values in this interval not displayed.   Liver Function Tests Recent Labs    11/18/22 1711 11/19/22 0337  AST 48* 43*  ALT 36 35  ALKPHOS 95 91  BILITOT 1.5* 1.5*  PROT 7.0 6.6  ALBUMIN 2.8* 2.6*   No results for input(s): "LIPASE", "AMYLASE" in the last 72 hours. Cardiac Enzymes No results for input(s): "CKTOTAL", "CKMB", "CKMBINDEX", "TROPONINI" in the last 72 hours.  BNP: BNP (last 3 results) Recent Labs    04/04/22 1202 06/07/22 0142  BNP 212.5* 2,548.2*    ProBNP (last 3 results) No results for input(s): "PROBNP" in the last 8760 hours.  D-Dimer No results for input(s): "DDIMER" in the last 72 hours. Hemoglobin A1C No results for input(s): "HGBA1C" in the last 72 hours. Fasting Lipid Panel Recent Labs    11/18/22 0130  TRIG 110   Thyroid Function Tests No results for input(s): "TSH", "T4TOTAL", "T3FREE", "THYROIDAB" in the last 72 hours.  Invalid input(s): "FREET3"  Other results:   Imaging    EEG adult  Result Date: 11/18/2022 Charlsie Quest, MD     11/18/2022 11:26 AM Patient Name: GREYSON PEAVY MRN: [id number] Epilepsy Attending: Charlsie Quest Referring Physician/Provider: Simonne Martinet, NP Date: 11/18/2022 Duration: 22.16 mins Patient history: 67yo M s/p cardiac arrest. EEG to evaluate for seizure Level of alertness: comatose AEDs during EEG study: Propofol Technical aspects: This EEG study was done with scalp electrodes positioned according to the 10-20 International system of electrode placement. Electrical activity was reviewed with band pass filter of 1-70Hz , sensitivity of 7 uV/mm, display speed of 89mm/sec with a 60Hz  notched filter applied as appropriate. EEG data  were recorded continuously and digitally stored.  Video monitoring was available and reviewed as appropriate. Description: EEG showed continuous generalized 3 to 6 Hz theta-delta slowing with overriding 15 to 18 Hz beta activity distributed symmetrically and diffusely.  Hyperventilation and photic stimulation were not performed.   ABNORMALITY - Continuous slow, generalized - Excessive beta, generalized IMPRESSION: This study is suggestive of severe diffuse encephalopathy likely related to sedation. No seizures or epileptiform discharges were seen throughout the recording. Charlsie Quest   ECHOCARDIOGRAM COMPLETE  Result Date: 11/18/2022    ECHOCARDIOGRAM REPORT   Patient Name:   TEION BALLIN Date of Exam: 11/18/2022 Medical Rec #:  [id number]         Height:       72.0 in Accession #:    [id number]        Weight:       198.4 lb Date of Birth:  10/06/1955        BSA:          2.123 m Patient Age:    67 years          BP:           116/67 mmHg Patient Gender: M                 HR:           66 bpm. Exam Location:  Inpatient Procedure: 2D Echo, Cardiac Doppler, Color Doppler and Intracardiac            Opacification Agent Indications:    Cardiac arrest  History:        Patient has prior history of Echocardiogram examinations, most                 recent 06/07/2022. Cardiomyopathy and CHF, CAD, Arrythmias:NSVT;                 Risk Factors:Hypertension. Cocaine abuse, CKD.  Sonographer:    Milda Smart Referring Phys: [id number] PETER E BABCOCK  Sonographer Comments: Echo performed with patient supine and on artificial respirator and Technically difficult study due to poor echo windows. Image acquisition challenging due to patient body habitus and Image acquisition challenging due to respiratory  motion. Low parasternals. IMPRESSIONS  1. Left ventricular ejection fraction, by estimation, is 25 to 30%. The left ventricle has severely decreased function. The left ventricle demonstrates global hypokinesis. The left  ventricular internal cavity size was severely dilated. There is mild  concentric left ventricular hypertrophy. Left ventricular diastolic parameters are consistent with Grade I diastolic dysfunction (impaired relaxation).  2. Right ventricular systolic function is normal. The right ventricular size is normal.  3. The mitral valve is normal in structure. Trivial mitral valve regurgitation. No evidence of mitral stenosis.  4. There is a hazy mobile density on the aortic side of the aorti valve cusp seen in the PSAX view at the base of the cusp. Cannot rule out vegetation. The aortic valve is tricuspid in structure. Aortic valve regurgitation is mild to moderate. Aortic valve sclerosis/calcification is present, without any evidence of aortic stenosis. Aortic regurgitation PHT measures 672 msec. Aortic valve area, by VTI measures 1.76 cm. Aortic valve mean gradient measures 12.0 mmHg. Aortic valve Vmax measures 2.04 m/s.  5. Aortic dilatation noted. There is moderate dilatation of the ascending aorta, measuring 46 mm.  6. The inferior vena cava is normal in size with greater than 50% respiratory variability, suggesting right atrial pressure of 3 mmHg. Conclusion(s)/Recommendation(s): Findings concerning for Aortic valve vegetation, would recommend Transesophageal Echocardiogram for clarification. FINDINGS  Left Ventricle: Left ventricular ejection fraction, by estimation, is 25 to 30%. The left ventricle has severely decreased function. The left ventricle demonstrates global hypokinesis. Definity contrast agent was given IV to delineate the left ventricular endocardial borders. The left ventricular internal cavity size was severely dilated. There is mild concentric left ventricular hypertrophy. Abnormal (paradoxical) septal motion, consistent with RV pacemaker. Left ventricular diastolic parameters are consistent with Grade I diastolic dysfunction (impaired relaxation). Normal left ventricular filling pressure. Right  Ventricle: The right ventricular size is normal. No increase in right ventricular wall thickness. Right ventricular systolic function is normal. Left Atrium: Left atrial size was normal in size. Right Atrium: Right atrial size was normal in size. Pericardium: There is no evidence of pericardial effusion. Mitral Valve: The mitral valve is normal in structure. Trivial mitral valve regurgitation. No evidence of mitral valve stenosis. MV peak gradient, 3.4 mmHg. The mean mitral valve gradient is 1.0 mmHg. Tricuspid Valve: The tricuspid valve is normal in structure. Tricuspid valve regurgitation is not demonstrated. No evidence of tricuspid stenosis. Aortic Valve: There is a hazy mobile density on the aortic side of the aorti valve cusp seen in the PSAX view at the base of the cusp. Cannot rule out vegetation. The aortic valve is tricuspid. Aortic valve regurgitation is mild to moderate. Aortic regurgitation PHT measures 672 msec. Aortic valve sclerosis/calcification is present, without any evidence of aortic stenosis. Aortic valve mean gradient measures 12.0 mmHg. Aortic valve peak gradient measures 16.6 mmHg. Aortic valve area, by VTI measures 1.76 cm. Pulmonic Valve: The pulmonic valve was normal in structure. Pulmonic valve regurgitation is not visualized. No evidence of pulmonic stenosis. Aorta: Aortic dilatation noted. There is moderate dilatation of the ascending aorta, measuring 46 mm. Venous: The inferior vena cava is normal in size with greater than 50% respiratory variability, suggesting right atrial pressure of 3 mmHg. IAS/Shunts: No atrial level shunt detected by color flow Doppler. Additional Comments: A device lead is visualized.  LEFT VENTRICLE PLAX 2D LVIDd:         6.70 cm   Diastology LVIDs:         5.60 cm   LV e' medial:    3.99 cm/s LV PW:         1.70 cm   LV E/e' medial:  12.2 LV IVS:        1.20 cm   LV e' lateral:  3.71 cm/s LVOT diam:     2.00 cm   LV E/e' lateral: 13.1 LV SV:         57 LV  SV Index:   27 LVOT Area:     3.14 cm  RIGHT VENTRICLE             IVC RV S prime:     11.20 cm/s  IVC diam: 2.20 cm TAPSE (M-mode): 2.7 cm LEFT ATRIUM             Index        RIGHT ATRIUM           Index LA diam:        3.40 cm 1.60 cm/m   RA Area:     16.80 cm LA Vol (A2C):   40.2 ml 18.93 ml/m  RA Volume:   44.90 ml  21.15 ml/m LA Vol (A4C):   19.0 ml 8.95 ml/m LA Biplane Vol: 29.7 ml 13.99 ml/m  AORTIC VALVE AV Area (Vmax):    1.97 cm AV Area (Vmean):   1.71 cm AV Area (VTI):     1.76 cm AV Vmax:           204.00 cm/s AV Vmean:          166.000 cm/s AV VTI:            0.326 m AV Peak Grad:      16.6 mmHg AV Mean Grad:      12.0 mmHg LVOT Vmax:         128.00 cm/s LVOT Vmean:        90.300 cm/s LVOT VTI:          0.183 m LVOT/AV VTI ratio: 0.56 AI PHT:            672 msec  AORTA Ao Root diam: 3.80 cm Ao Asc diam:  4.60 cm MITRAL VALVE MV Area (PHT): 2.51 cm    SHUNTS MV Area VTI:   1.87 cm    Systemic VTI:  0.18 m MV Peak grad:  3.4 mmHg    Systemic Diam: 2.00 cm MV Mean grad:  1.0 mmHg MV Vmax:       0.92 m/s MV Vmean:      58.5 cm/s MV Decel Time: 302 msec MR Peak grad: 35.8 mmHg MR Vmax:      299.00 cm/s MV E velocity: 48.70 cm/s MV A velocity: 89.10 cm/s MV E/A ratio:  0.55 Armanda Magic MD Electronically signed by Armanda Magic MD Signature Date/Time: 11/18/2022/10:15:35 AM    Final      Medications:     Scheduled Medications:  amiodarone  200 mg Per Tube Daily   arformoterol  15 mcg Nebulization BID   aspirin  325 mg Per Tube Daily   budesonide (PULMICORT) nebulizer solution  0.25 mg Nebulization BID   Chlorhexidine Gluconate Cloth  6 each Topical Daily   docusate  100 mg Per Tube BID   famotidine  20 mg Per Tube BID   feeding supplement (PROSource TF20)  60 mL Per Tube Daily   heparin  5,000 Units Subcutaneous Q8H   mouth rinse  15 mL Mouth Rinse Q2H   polyethylene glycol  17 g Per Tube Daily   revefenacin  175 mcg Nebulization Daily    Infusions:  sodium chloride Stopped  (11/17/22 2206)   ampicillin-sulbactam (UNASYN) IV Stopped (11/19/22 1610)   epinephrine Stopped (11/18/22 1612)   feeding supplement (VITAL AF 1.2 CAL) 30 mL/hr  at 11/19/22 0700   fentaNYL infusion INTRAVENOUS 100 mcg/hr (11/19/22 0700)   norepinephrine (LEVOPHED) Adult infusion 8 mcg/min (11/19/22 0720)   propofol (DIPRIVAN) infusion 30 mcg/kg/min (11/19/22 0700)    PRN Medications: busPIRone **OR** busPIRone, fentaNYL (SUBLIMAZE) injection, fentaNYL (SUBLIMAZE) injection, ondansetron (ZOFRAN) IV, mouth rinse    Patient Profile     Mr Owusu is 67 year old with a history of HTN, DMI. CAD, VT, LBBB, BiV ICD, CKD Stage IIIa, polysubstance abuse, NIMC, and HFrEF. Followed in the HF clinic several years.     Admitted PEA arrest c/b cardiogenic shock.  Assessment/Plan   1. Out of hospital PEA arrest-->Cardiogenic Shock  Found down. Unwitnessed. EMS rhythm PEA. CPR ROSC ~ 8 minutes.  Lactic acid >9-->today 2.1   - On Norepi 8 mcg.    - Check CO-OX .-->56%   2. Acute Respiratory Failure  -Intubated in ED . Remains intubated FiO2 40% - Getting Unasyn for possible aspiration pneumonia.  - CVP 9. Increase lasix to 80 mg twice a day.     3. Chronic HFrEf, NICM -Suspected HTN/Cocaine Abuse. Has BiV -Today Echo EF 20-25% RV moderately reduced.  - Holding GDMT due to pressor requirements.    4. AKI -Creatinine baseline ~ 1.5  -Admit creatinine 2.1-->1.6 ->1.6 -Follow daily BMET.    5. NSVT  K4.1  - Check Mag. Replete if < 2    6. Polysubstance Abuse UDS + cocaine + Benz+ THC   7. Hyperkalemia  K 6.1 on admit. Resolved.      Addendum : Mag 2.4 CO-OX 55%.  Length of Stay: 2  Tonye Becket, NP  11/19/2022, 7:48 AM  Advanced Heart Failure Team Pager [phone] (M-F; 7a - 5p)  Please contact [facility] for night-coverage after hours (5p -7a ) and weekends on [email or web address]  Patient seen and examined with the above-signed Advanced Practice Provider and/or Housestaff. I  personally reviewed laboratory data, imaging studies and relevant notes. I independently examined the patient and formulated the important aspects of the plan. I have edited the note to reflect any of my changes or salient points. I have personally discussed the plan with the patient and/or family.  Remains intubated/sedated. Agitated when awake. Remains on NE 8. CVP up. Co-ox 56%  General:  Intubated sedated Chronically ill appearing  HEENT: normal + ETT Neck: supple. JVP upCarotids 2+ bilat; no bruits. No lymphadenopathy or thryomegaly appreciated. Cor: PMI nondisplaced. Regular rate & rhythm. No rubs, gallops or murmurs. Lungs: clear Abdomen: soft, nontender, nondistended. No hepatosplenomegaly. No bruits or masses. Good bowel sounds. Extremities: no cyanosis, clubbing, rash, edema Neuro: intubated sedated  Continue NE support and diuresis. Will likely need more diuresis before attempt at vent wean. Suspect vent wean will be a challenge with agitation.   Rhythm currently stable. D/w CCM.   CRITICAL CARE Performed by: Arvilla Meres  Total critical care time: 35 minutes  Critical care time was exclusive of separately billable procedures and treating other patients.  Critical care was necessary to treat or prevent imminent or life-threatening deterioration.  Critical care was time spent personally by me (independent of midlevel providers or residents) on the following activities: development of treatment plan with patient and/or surrogate as well as nursing, discussions with consultants, evaluation of patient's response to treatment, examination of patient, obtaining history from patient or surrogate, ordering and performing treatments and interventions, ordering and review of laboratory studies, ordering and review of radiographic studies, pulse oximetry and re-evaluation of patient's condition.  Arvilla Meres, MD  12:51 PM

## 2022-11-19 NOTE — Progress Notes (Signed)
RT (x2) attempted aline without success. MD made aware at this time.

## 2022-11-20 ENCOUNTER — Other Ambulatory Visit: Payer: Self-pay

## 2022-11-20 DIAGNOSIS — I469 Cardiac arrest, cause unspecified: Secondary | ICD-10-CM | POA: Diagnosis not present

## 2022-11-20 DIAGNOSIS — J9602 Acute respiratory failure with hypercapnia: Secondary | ICD-10-CM

## 2022-11-20 DIAGNOSIS — J9601 Acute respiratory failure with hypoxia: Secondary | ICD-10-CM | POA: Diagnosis not present

## 2022-11-20 LAB — BASIC METABOLIC PANEL
Anion gap: 16 — ABNORMAL HIGH (ref 5–15)
BUN: 28 mg/dL — ABNORMAL HIGH (ref 8–23)
CO2: 20 mmol/L — ABNORMAL LOW (ref 22–32)
Calcium: 8.9 mg/dL (ref 8.9–10.3)
Chloride: 100 mmol/L (ref 98–111)
Creatinine, Ser: 1.41 mg/dL — ABNORMAL HIGH (ref 0.61–1.24)
GFR, Estimated: 54 mL/min — ABNORMAL LOW (ref >60)
Glucose, Bld: 109 mg/dL — ABNORMAL HIGH (ref 70–99)
Potassium: 4.3 mmol/L (ref 3.5–5.1)
Sodium: 136 mmol/L (ref 135–145)

## 2022-11-20 LAB — CBC
HCT: 45 % (ref 39.0–52.0)
HCT: 44.2 % (ref 39.0–52.0)
Hemoglobin: 14.4 g/dL (ref 13.0–17.0)
Hemoglobin: 14.3 g/dL (ref 13.0–17.0)
MCH: 26.4 pg (ref 26.0–34.0)
MCH: 26.4 pg (ref 26.0–34.0)
MCHC: 32 g/dL (ref 30.0–36.0)
MCHC: 32.4 g/dL (ref 30.0–36.0)
MCV: 82.4 fL (ref 80.0–100.0)
MCV: 81.7 fL (ref 80.0–100.0)
Platelets: 129 10*3/uL — ABNORMAL LOW (ref 150–400)
Platelets: 137 10*3/uL — ABNORMAL LOW (ref 150–400)
RBC: 5.46 MIL/uL (ref 4.22–5.81)
RBC: 5.41 MIL/uL (ref 4.22–5.81)
RDW: 17.5 % — ABNORMAL HIGH (ref 11.5–15.5)
RDW: 17.6 % — ABNORMAL HIGH (ref 11.5–15.5)
WBC: 9.9 10*3/uL (ref 4.0–10.5)
WBC: 8.2 10*3/uL (ref 4.0–10.5)
nRBC: 0 % (ref 0.0–0.2)
nRBC: 0 % (ref 0.0–0.2)

## 2022-11-20 LAB — COMPREHENSIVE METABOLIC PANEL
ALT: 45 U/L — ABNORMAL HIGH (ref 0–44)
AST: 68 U/L — ABNORMAL HIGH (ref 15–41)
Albumin: 3.1 g/dL — ABNORMAL LOW (ref 3.5–5.0)
Alkaline Phosphatase: 106 U/L (ref 38–126)
Anion gap: 13 (ref 5–15)
BUN: 31 mg/dL — ABNORMAL HIGH (ref 8–23)
CO2: 22 mmol/L (ref 22–32)
Calcium: 8.9 mg/dL (ref 8.9–10.3)
Chloride: 100 mmol/L (ref 98–111)
Creatinine, Ser: 1.66 mg/dL — ABNORMAL HIGH (ref 0.61–1.24)
GFR, Estimated: 45 mL/min — ABNORMAL LOW (ref >60)
Glucose, Bld: 128 mg/dL — ABNORMAL HIGH (ref 70–99)
Potassium: 4.2 mmol/L (ref 3.5–5.1)
Sodium: 135 mmol/L (ref 135–145)
Total Bilirubin: 1.4 mg/dL — ABNORMAL HIGH (ref 0.3–1.2)
Total Protein: 8.2 g/dL — ABNORMAL HIGH (ref 6.5–8.1)

## 2022-11-20 LAB — GLUCOSE, CAPILLARY
Glucose-Capillary: 104 mg/dL — ABNORMAL HIGH (ref 70–99)
Glucose-Capillary: 118 mg/dL — ABNORMAL HIGH (ref 70–99)
Glucose-Capillary: 93 mg/dL (ref 70–99)
Glucose-Capillary: 106 mg/dL — ABNORMAL HIGH (ref 70–99)

## 2022-11-20 LAB — MAGNESIUM: Magnesium: 2.4 mg/dL (ref 1.7–2.4)

## 2022-11-20 LAB — CK: Total CK: 1079 U/L — ABNORMAL HIGH (ref 49–397)

## 2022-11-20 LAB — PHOSPHORUS: Phosphorus: 3.8 mg/dL (ref 2.5–4.6)

## 2022-11-20 LAB — CULTURE, RESPIRATORY W GRAM STAIN

## 2022-11-20 LAB — COOXEMETRY PANEL
Carboxyhemoglobin: 3.3 % — ABNORMAL HIGH (ref 0.5–1.5)
Methemoglobin: <0.7 % (ref 0.0–1.5)
O2 Saturation: 57.5 %
Total hemoglobin: 13.7 g/dL (ref 12.0–16.0)

## 2022-11-20 MED ORDER — AMIODARONE HCL 200 MG PO TABS
200.0000 mg | ORAL_TABLET | Freq: Every day | ORAL | Status: DC
  Administered 2022-11-21 – 2022-11-26 (×6): 200 mg via ORAL
  Filled 2022-11-20 (×6): qty 1

## 2022-11-20 MED ORDER — DEXMEDETOMIDINE HCL IN NACL 400 MCG/100ML IV SOLN
0.0000 ug/kg/h | INTRAVENOUS | Status: DC
  Filled 2022-11-20: qty 100

## 2022-11-20 MED ORDER — DOCUSATE SODIUM 100 MG PO CAPS
100.0000 mg | ORAL_CAPSULE | Freq: Two times a day (BID) | ORAL | Status: DC
  Administered 2022-11-20 – 2022-11-26 (×10): 100 mg via ORAL
  Filled 2022-11-20 (×11): qty 1

## 2022-11-20 MED ORDER — FUROSEMIDE 10 MG/ML IJ SOLN
40.0000 mg | Freq: Once | INTRAMUSCULAR | Status: AC
  Administered 2022-11-20: 40 mg via INTRAVENOUS
  Filled 2022-11-20: qty 4

## 2022-11-20 MED ORDER — ASPIRIN 325 MG PO TABS
325.0000 mg | ORAL_TABLET | Freq: Every day | ORAL | Status: DC
  Administered 2022-11-21: 325 mg via ORAL
  Filled 2022-11-20: qty 1

## 2022-11-20 MED ORDER — MEXILETINE HCL 150 MG PO CAPS
150.0000 mg | ORAL_CAPSULE | Freq: Two times a day (BID) | ORAL | Status: DC
  Administered 2022-11-20 – 2022-11-26 (×12): 150 mg via ORAL
  Filled 2022-11-20 (×13): qty 1

## 2022-11-20 MED ORDER — ORAL CARE MOUTH RINSE
15.0000 mL | Freq: Four times a day (QID) | OROMUCOSAL | Status: DC
  Administered 2022-11-20 – 2022-11-26 (×21): 15 mL via OROMUCOSAL

## 2022-11-20 MED ORDER — FAMOTIDINE 20 MG PO TABS
20.0000 mg | ORAL_TABLET | Freq: Two times a day (BID) | ORAL | Status: DC
  Administered 2022-11-20 – 2022-11-21 (×2): 20 mg via ORAL
  Filled 2022-11-20 (×2): qty 1

## 2022-11-20 MED ORDER — ORAL CARE MOUTH RINSE: 15.0000 mL | OROMUCOSAL | Status: DC | PRN

## 2022-11-20 MED ORDER — POLYETHYLENE GLYCOL 3350 17 G PO PACK: 17.0000 g | PACK | Freq: Every day | ORAL | Status: DC

## 2022-11-20 NOTE — Progress Notes (Addendum)
Advanced Heart Failure Rounding Note  PCP-Cardiologist: None   Subjective:   Yesterday IV lasix increased 80 mg twice a day. Negative 2 liters.  CVP down 4-5   Remains on Norepi 6 mcg. CO-OX 57.5%  Awake on the vent.   Objective:   Weight Range: 90.5 kg Body mass index is 27.06 kg/m.   Vital Signs:   Temp:  [97.3 F (36.3 C)-100 F (37.8 C)] 98.2 F (36.8 C) (04/10 0700) Pulse Rate:  [45-73] 70 (04/10 0732) Resp:  [12-24] 16 (04/10 0732) BP: (79-136)/(46-113) 136/94 (04/10 0732) SpO2:  [87 %-100 %] 99 % (04/10 0732) Arterial Line BP: (82-168)/(55-133) 135/76 (04/10 0732) FiO2 (%):  [40 %] 40 % (04/10 0400) Weight:  [90.5 kg] 90.5 kg (04/10 0500) Last BM Date :  (Prior to arrival)  Weight change: Filed Weights   11/18/22 0500 11/19/22 0500 11/20/22 0500  Weight: 90 kg 92.5 kg 90.5 kg    Intake/Output:   Intake/Output Summary (Last 24 hours) at 11/20/2022 0751 Last data filed at 11/20/2022 0700 Gross per 24 hour  Intake 2890.69 ml  Output 4895 ml  Net -2004.31 ml    CVP 4-5  Physical Exam    General: Intubated.  HEENT: ETT Neck: supple. no JVD. Carotids 2+ bilat; no bruits. No lymphadenopathy or thryomegaly appreciated. Cor: PMI nondisplaced. Regular rate & rhythm. No rubs, gallops or murmurs. Lungs: Coarse throughout Abdomen: soft, nontender, nondistended. No hepatosplenomegaly. No bruits or masses. Good bowel sounds. Extremities: no cyanosis, clubbing, rash, edema. MAE x4  Neuro: Intubated. Awake. Follows commands.  Telemetry  A sensed V paced 60s  brief.NSVT  EKG    N/A   Labs    CBC Recent Labs    11/17/22 1405 11/17/22 1416 11/19/22 0337 11/20/22 0409  WBC 12.3*  --  9.4 9.9  NEUTROABS 4.9  --   --   --   HGB 16.0   < > 13.1 14.4  HCT 53.6*   < > 39.7 45.0  MCV 89.9  --  81.7 82.4  PLT 153  --  110* 129*   < > = values in this interval not displayed.   Basic Metabolic Panel Recent Labs    99/83/38 1717 11/20/22 0409  NA 137  136  K 4.3 4.3  CL 98 100  CO2 25 20*  GLUCOSE 83 109*  BUN 23 28*  CREATININE 1.73* 1.41*  CALCIUM 8.9 8.9  MG 2.3 2.4  PHOS 3.9 3.8   Liver Function Tests Recent Labs    11/19/22 0337 11/19/22 1717  AST 43* 45*  ALT 35 34  ALKPHOS 91 95  BILITOT 1.5* 1.2  PROT 6.6 7.9  ALBUMIN 2.6* 3.1*   No results for input(s): "LIPASE", "AMYLASE" in the last 72 hours. Cardiac Enzymes No results for input(s): "CKTOTAL", "CKMB", "CKMBINDEX", "TROPONINI" in the last 72 hours.  BNP: BNP (last 3 results) Recent Labs    04/04/22 1202 06/07/22 0142  BNP 212.5* 2,548.2*    ProBNP (last 3 results) No results for input(s): "PROBNP" in the last 8760 hours.   D-Dimer No results for input(s): "DDIMER" in the last 72 hours. Hemoglobin A1C No results for input(s): "HGBA1C" in the last 72 hours. Fasting Lipid Panel Recent Labs    11/18/22 0130  TRIG 110   Thyroid Function Tests No results for input(s): "TSH", "T4TOTAL", "T3FREE", "THYROIDAB" in the last 72 hours.  Invalid input(s): "FREET3"  Other results:   Imaging    No results found.  Medications:     Scheduled Medications:  amiodarone  200 mg Per Tube Daily   arformoterol  15 mcg Nebulization BID   aspirin  325 mg Per Tube Daily   budesonide (PULMICORT) nebulizer solution  0.25 mg Nebulization BID   Chlorhexidine Gluconate Cloth  6 each Topical Daily   docusate  100 mg Per Tube BID   famotidine  20 mg Per Tube BID   feeding supplement (PROSource TF20)  60 mL Per Tube Daily   heparin  5,000 Units Subcutaneous Q8H   mexiletine  150 mg Per Tube Q12H   mouth rinse  15 mL Mouth Rinse Q2H   polyethylene glycol  17 g Per Tube Daily   revefenacin  175 mcg Nebulization Daily    Infusions:  sodium chloride Stopped (11/17/22 2206)   ampicillin-sulbactam (UNASYN) IV Stopped (11/20/22 0542)   epinephrine Stopped (11/18/22 1612)   feeding supplement (VITAL AF 1.2 CAL) 60 mL/hr at 11/20/22 0700   fentaNYL infusion  INTRAVENOUS 75 mcg/hr (11/20/22 0700)   norepinephrine (LEVOPHED) Adult infusion 8 mcg/min (11/20/22 0700)   propofol (DIPRIVAN) infusion 20 mcg/kg/min (11/20/22 0700)    PRN Medications: fentaNYL, ondansetron (ZOFRAN) IV    Patient Profile     Mr Roxan HockeyRobinson is 67 year old with a history of HTN, DMI. CAD, VT, LBBB, BiV ICD, CKD Stage IIIa, polysubstance abuse, NIMC, and HFrEF. Followed in the HF clinic several years.     Admitted PEA arrest c/b cardiogenic shock.  Assessment/Plan   1. Out of hospital PEA arrest-->Cardiogenic Shock  Found down. Unwitnessed. EMS rhythm PEA. CPR ROSC ~ 8 minutes.  Lactic acid >9-->today 2.1   - On Norepi 6 mcg. Slowly weaning.    - Check CO-OX .-->57.5 %   2. Acute Respiratory Failure  -Intubated in ED . Remains intubated FiO2 40% - Getting Unasyn for possible aspiration pneumonia.  - CVP 4-5 .  Give one more dose of IV lasix.   3. Chronic HFrEf, NICM -Suspected HTN/Cocaine Abuse. Has BiV -Today Echo EF 20-25% RV moderately reduced.  - Holding GDMT due to pressor requirements.    4. AKI -Creatinine baseline ~ 1.5  -Admit creatinine 2.1-->1.6 ->1.6->1.4  -Follow daily BMET.    5. NSVT  K4.3 Mag 2.4  - Check Mag. Replete if < 2    6. Polysubstance Abuse UDS + cocaine + Benz+ THC   7. Hyperkalemia  K 6.1 on admit. Resolved. 4.3 today.      Addendum : Mag 2.4 CO-OX 55%.  Length of Stay: 3  Amy Clegg, NP  11/20/2022, 7:51 AM  Advanced Heart Failure Team Pager [phone] (M-F; 7a - 5p)  Please contact [facility] for night-coverage after hours (5p -7a ) and weekends on [email or web address]  Agree with above.   Remains on vent this am. Plan for possible extubation today. On NE 6. Co-ox ok. Rhythm stable. CVP down after aggressive IV diuresis  General:  Intubated/sedated Chronically ill-appearing. HEENT: normal Neck: supple. no JVD. Carotids 2+ bilat; no bruits. No lymphadenopathy or thryomegaly appreciated. Cor: Regular rate & rhythm. No  rubs, gallops or murmurs. Lungs: clear Abdomen: soft, nontender, nondistended. No hepatosplenomegaly. No bruits or masses. Good bowel sounds. Extremities: no cyanosis, clubbing, rash, edema Neuro: intubated/sedated  Hemodynamics stable on NE. Has diuresed well. Plan for possible extubation today.   CRITICAL CARE Performed by: Arvilla MeresBensimhon, Katsumi Wisler  Total critical care time: 35 minutes  Critical care time was exclusive of separately billable procedures and treating other patients.  Critical care  was necessary to treat or prevent imminent or life-threatening deterioration.  Critical care was time spent personally by me (independent of midlevel providers or residents) on the following activities: development of treatment plan with patient and/or surrogate as well as nursing, discussions with consultants, evaluation of patient's response to treatment, examination of patient, obtaining history from patient or surrogate, ordering and performing treatments and interventions, ordering and review of laboratory studies, ordering and review of radiographic studies, pulse oximetry and re-evaluation of patient's condition.   Arvilla Meres, MD

## 2022-11-20 NOTE — Evaluation (Signed)
Clinical/Bedside Swallow Evaluation Patient Details  Name: LUCIO WYKE MRN: [id number] Date of Birth: 08-03-1956  Today's Date: 11/20/2022 Time: SLP Start Time (ACUTE ONLY): 1630 SLP Stop Time (ACUTE ONLY): 1645 SLP Time Calculation (min) (ACUTE ONLY): 15 min  Past Medical History:  Past Medical History:  Diagnosis Date   Arthritis    "feels like it in my legs" (09/20/2014)   CAD (coronary artery disease)    Chronic kidney disease, stage 3a    Chronic systolic CHF (congestive heart failure)    Cocaine abuse    Hypertension    NICM (nonischemic cardiomyopathy)    NSVT (nonsustained ventricular tachycardia) 09/06/2016   pre diabetes    patient denies   Past Surgical History:  Past Surgical History:  Procedure Laterality Date   BIV ICD INSERTION CRT-D N/A 07/17/2022   Procedure: BIV ICD INSERTION CRT-D;  Surgeon: Duke Salvia, MD;  Location: [facility] INVASIVE CV LAB;  Service: Cardiovascular;  Laterality: N/A;   CARDIAC CATHETERIZATION N/A 08/27/2016   Procedure: Right/Left Heart Cath and Coronary Angiography;  Surgeon: Laurey Morale, MD;  Location: [facility] INVASIVE CV LAB;  Service: Cardiovascular;  Laterality: N/A;   COLONOSCOPY     CYSTECTOMY Right    "back of my shoulder"   RIGHT/LEFT HEART CATH AND CORONARY ANGIOGRAPHY N/A 12/04/2020   Procedure: RIGHT/LEFT HEART CATH AND CORONARY ANGIOGRAPHY;  Surgeon: Laurey Morale, MD;  Location: [facility] INVASIVE CV LAB;  Service: Cardiovascular;  Laterality: N/A;   RIGHT/LEFT HEART CATH AND CORONARY ANGIOGRAPHY N/A 10/10/2021   Procedure: RIGHT/LEFT HEART CATH AND CORONARY ANGIOGRAPHY;  Surgeon: Laurey Morale, MD;  Location: [facility] INVASIVE CV LAB;  Service: Cardiovascular;  Laterality: N/A;   TONSILLECTOMY     HPI:  Patient is a 67 y.o. male with PMH: nonischemic-cardiomyopathy status post ICD and pacemaker, polysubstance abuse, h/o nonsustained V tach, prediabetes. He presented to the ER via EMS after being found unresponsive with agonal  respiratory status and pinpoint pupils in a parking lot on 11/17/22; given Narcan by EMS, noted to be in PEA rhythm and CPR initiated with ROSC estimated at 8 minutes. He was agitated, coughing, retching, moving all extemitites but in no acute distress, he was intubated for airway protection on arrival to ER. He had copious amounts of frothy bloody sputnum from ET tube. He was extubated on 4/10 at 0915 to 4L via [address]. He failed [facility] with nursing and was kept NPO.    Assessment / Plan / Recommendation  Clinical Impression  Patient is presenting with clinical s/s of what appears to be a post-extubation dysphagia. He has no h/o dysphagia and he denies having had any difficulty with swallowing in the past. His voice is hoarse but he achieves steady voicing at conversational level. He does endorse pain when coughing. Per RN, patient seemed to do alright with swallowing water during Yale assessment earlier, however he then had a delayed cough and expectoration of copious amounts of secretions. Patient also had incident today of emesis of yellow, frothy secretions in absence of PO's. SLP assessed patient's swallow with ice chips. He exhibited multiple swallows at times but did not appear to be struggling to swallow. He did not exhibit any immediate coughing or throat clearing, but did exhibit delayed coughing with mobilization of secretions into oral cavity, which he would then suction out. SLP recommending to continue NPO, continue to allow PRN ice chips but to cease if excessive coughing. SLP will f/u next date to determine readiness for PO trials. SLP  Visit Diagnosis: Dysphagia, unspecified (R13.10)    Aspiration Risk  Moderate aspiration risk;Mild aspiration risk    Diet Recommendation NPO;Ice chips PRN after oral care   Medication Administration: Via alternative means Compensations: Slow rate Postural Changes: Seated upright at 90 degrees    Other  Recommendations Oral Care Recommendations: Oral care prior  to ice chip/H20;Oral care QID    Recommendations for follow up therapy are one component of a multi-disciplinary discharge planning process, led by the attending physician.  Recommendations may be updated based on patient status, additional functional criteria and insurance authorization.  Follow up Recommendations Follow physician's recommendations for discharge plan and follow up therapies      Assistance Recommended at Discharge    Functional Status Assessment Patient has had a recent decline in their functional status and demonstrates the ability to make significant improvements in function in a reasonable and predictable amount of time.  Frequency and Duration min 2x/week  2 weeks       Prognosis Prognosis for improved oropharyngeal function: Good      Swallow Study   General Date of Onset: 11/17/22 HPI: Patient is a 67 y.o. male with PMH: nonischemic-cardiomyopathy status post ICD and pacemaker, polysubstance abuse, h/o nonsustained V tach, prediabetes. He presented to the ER via EMS after being found unresponsive with agonal respiratory status and pinpoint pupils in a parking lot on 11/17/22; given Narcan by EMS, noted to be in PEA rhythm and CPR initiated with ROSC estimated at 8 minutes. He was agitated, coughing, retching, moving all extemitites but in no acute distress, he was intubated for airway protection on arrival to ER. He had copious amounts of frothy bloody sputnum from ET tube. He was extubated on 4/10 at 0915 to 4L via [place]. He failed [facility] with nursing and was kept NPO. Type of Study: Bedside Swallow Evaluation Previous Swallow Assessment: none found Diet Prior to this Study: NPO Temperature Spikes Noted: No Respiratory Status: Nasal cannula History of Recent Intubation: Yes Total duration of intubation (days): 4 days Date extubated: 11/20/22 Behavior/Cognition: Alert;Cooperative;Pleasant mood Oral Cavity Assessment: Other (comment) (some reddish-purple discoloration  of uvula, likely secondary to ET tube) Oral Care Completed by SLP: Recent completion by staff Oral Cavity - Dentition: Missing dentition Vision: Functional for self-feeding Self-Feeding Abilities: Able to feed self;Needs assist Patient Positioning: Upright in bed Baseline Vocal Quality: Hoarse Volitional Cough: Strong Volitional Swallow: Able to elicit    Oral/Motor/Sensory Function Overall Oral Motor/Sensory Function: Within functional limits   Ice Chips Ice chips: Impaired Pharyngeal Phase Impairments: Multiple swallows;Cough - Delayed   Thin Liquid Thin Liquid: Not tested    Nectar Thick Nectar Thick Liquid: Not tested   Honey Thick Honey Thick Liquid: Not tested   Puree Puree: Not tested   Solid     Solid: Not tested     Angela Nevin, MA, CCC-SLP Speech Therapy

## 2022-11-20 NOTE — Progress Notes (Addendum)
NAME:  Edward Mullen, MRN:  [id number], DOB:  07/05/1956, LOS: 3 ADMISSION DATE:  11/17/2022, CONSULTATION DATE:  11/17/2022 REFERRING MD:  Kommor - EDP, CHIEF COMPLAINT:  cardiac arrest   History of Present Illness:  67 year-old man patient with a known history of nonischemic-cardiomyopathy status post ICD and pacemaker.  Followed in the outpatient setting by [facility].  Presents to the emergency room via EMS after being found unresponsive with agonal respiratory status in a parking lot.  His pupils were pinpoint.  He was given intramuscular Narcan by EMS.  On EMS arrival he was noted to be in PEA rhythm and CPR was initiated, time to return of spontaneous circulation estimated at 8 minutes.  On arrival to the emergency room he was agitated, coughing, reaching, moving all extremities but in no acute distress.  He was intubated for airway protection, subsequently became severely hypotensive.  Had copious amounts of frothy bloody sputum from endotracheal tube.  Central venous access was placed as was arterial access.  He was started on norepinephrine and then later epinephrine infusion on critical care arrival critical care was asked to admit.  Pertinent Medical History:   Past Medical History:  Diagnosis Date   Arthritis    "feels like it in my legs" (09/20/2014)   CAD (coronary artery disease)    Chronic kidney disease, stage 3a    Chronic systolic CHF (congestive heart failure)    Cocaine abuse    Hypertension    NICM (nonischemic cardiomyopathy)    NSVT (nonsustained ventricular tachycardia) 09/06/2016   pre diabetes    patient denies   Significant Hospital Events: Including procedures, antibiotic start and stop dates in addition to other pertinent events   4/7 Admitted status post 8-minute cardiac arrest initial rhythm PEA/paced. Narcan given by EMS. Intubated in the emergency room, subsequent shock. HF consulted. UDS + cocaine, BZDs, THC. Trops mildly elevated. LA peaked >  9.0. Resp Cx few gram+ cocci (pairs, clusters), rare GNRs. 4/8 Runs of VT overnight, nonsustained + PVCs. Electrolytes repleted. +Rhinovirus on RVP. Echo EF 20-25%, RV moderately reduced. Co-ox 74%. 4/9 Sedation weaned this AM, wide awake/following commands, agitated/uncomfortable. Failed SBT due to tachypnea/low volumes, though this was in the setting of agitation. ?Pain from rib fractures/sternum fracture. Co-ox 55.7% (fem).   Interim History / Subjective:  Awake, following commands, weaning on PSV  NE at 5 CVP 4-5 Coox 57.5%  Objective:  Blood pressure (!) 129/91, pulse 71, temperature 98.6 F (37 C), temperature source Bladder, resp. rate (!) 25, height 6' (1.829 m), weight 90.5 kg, SpO2 100 %. CVP:  [5 mmHg-18 mmHg] 8 mmHg  Vent Mode: PRVC FiO2 (%):  [40 %] 40 % Set Rate:  [22 bmp] 22 bmp Vt Set:  [620 mL] 620 mL PEEP:  [5 cmH20] 5 cmH20 Plateau Pressure:  [17 cmH20-22 cmH20] 22 cmH20   Intake/Output Summary (Last 24 hours) at 11/20/2022 0846 Last data filed at 11/20/2022 0700 Gross per 24 hour  Intake 2803.59 ml  Output 4620 ml  Net -1816.41 ml   Filed Weights   11/18/22 0500 11/19/22 0500 11/20/22 0500  Weight: 90 kg 92.5 kg 90.5 kg   Physical Examination: NE 5 Fentanyl 75 off Propofol 20 General:  AoC ill appearing elderly male lying in bed NAD HEENT: MM pink/moist, ETT/ OGT, poor dentition Neuro: awake, follows simple commands, intermittently restless/ coughing CV: paced PULM:  PSV 8/5, coarse, diminished in bases, no wheeze GI: soft, bs hypo, NT, foley  Extremities:  warm/dry, no LE edema  Skin: no rashes or lesions  UOP 4.8L/ 24hrs  -2L /24hrs Net -2.6L Coox is off femoral CVL Labs reviewed> sCr 1.73> 1.41, plts 110> 129  Resolved Hospital Problem List:    Assessment & Plan:  Cardiac arrest.  PEA/with paced rhythm Time resuscitation estimated at 8 minutes. Echo 4/8 with EF 20-25%. UDS +cocaine, BZDs, THC. EP interrogated PPM with no events recorded.  Suspect combination of hypoxic event + cocaine use. - ICU monitoring post-arrest - stop artic sun, avoid fevers  - cont supportive care   Cardiogenic shock status postcardiac arrest, with known history of underlying nonischemic cardiomyopathy/ HFrEF NSVT Mild leukocytosis Lactic acidosis in the setting of shock, resolving Patient has a known underlying cardiomyopathy with a EF of 20 to 25%.  CT Chest/A/P demonstrated extensive GGOs bilaterally, ?pulmonary edema vs. contusions; progressive cardiomegaly, multiple rib fractures/distal sternum fracture. - Goal MAP > 65 - cont to wean NE as tolerated, Epi remains off - appreciate HF assistance  - trend coox/ CVP - ongoing diuresis as tolerated, net -3L  - cont amio/ mexiletine per HF - optimize electrolytes  - strict I/Os, weights  - holding GDMT given ongoing hypotension - Trend WBC, fever curve, abx as below   Acute hypoxic respiratory failure status postcardiac arrest and complicated by diffuse pulmonary edema +Rhinovirus infection Possible aspiration PNA History of COPD - full vent support (4-8cc/kg IBW)> weaning well, will proceed with extubation  - Wean FiO2 for O2 sat > 90% - cont BD, Brovana/Yupelri, Pulmicort - Diuresis as hemodynamics tolerates - cont unasyn for aspiration  - aggressive pulmonary hygiene post extubation> IS/ mobilize when able  Acute metabolic and possibly toxic encephalopathy.  EEG 4/8 with severe diffuse encephalopathy, no seizures.CT Head NAICA, parenchymal atrophy/chronic small vessel disease. - following commands, plans for extubation today  -  minimize sedation, continue supportive care - Correct metabolic derangements as able - Neuroprotective measures: HOB > 30 degrees, normoglycemia, normothermia, electrolytes WNL  Acute on chronic renal failure.  Baseline CKD stage IIIa Hyperkalemia, resolved  CT A/P with 3mm nonobstructing renal calculus (R). - sCr improving with diuresis.  Continue as renally  and BP tolerated - serial renal indices  - strict I/Os, daily wts - Avoid nephrotoxic agents as able - Ensure adequate renal perfusion  Enlarged prostate CT A/P 4/7 with markedly enlarged prostate gland protruding into bladder base. - PSA 2.32 (stable from 1 year prior) - Outpatient f/u  Polysubstance abuse UDS + cocaine, BZD, THC. - precedex post extubation prn if needed.  Will also start seroquel pending Qtc to help with withdrawal - Encourage cessation - TOC consult as appropriate  Best Practice (right click and "Reselect all SmartList Selections" daily)   Diet/type: NPO DVT prophylaxis: SCD GI prophylaxis: PPI Lines: Central line Foley:  Yes, and it is still needed Code Status:  full code Last date of multidisciplinary goals of care discussion [pending ]  No family at bedside 4/10, pending.   Critical care time:    The patient is critically ill with multiple organ system failure and requires high complexity decision making for assessment and support, frequent evaluation and titration of therapies, advanced monitoring, review of radiographic studies and interpretation of complex data.   Critical Care Time devoted to patient care services, exclusive of separately billable procedures, described in this note is 36 minutes.  Posey BoyerBrooke Kinesha Auten, NP [facility] 11/20/22 8:46 AM  Please see [email or web address] for pager details.  From 7A-7P if no  response, please call [phone] After hours, please call ELink [phone]

## 2022-11-20 NOTE — Procedures (Signed)
Extubation Procedure Note  Patient Details:   Name: Edward Mullen DOB: March 05, 1956 MRN: [id number]   Airway Documentation:    Vent end date: 11/20/22 Vent end time: 0915   Evaluation  O2 sats: stable throughout Complications: No apparent complications Patient did tolerate procedure well. Bilateral Breath Sounds: Rhonchi   Patient extubated per Dr Merrily Pew who was at bedside during extubation. Patient placed on 4L [place]. Patient is able to speak & cough post extubation. Currently in no distress.  Jacqulynn Cadet 11/20/2022, 9:20 AM

## 2022-11-21 DIAGNOSIS — E440 Moderate protein-calorie malnutrition: Secondary | ICD-10-CM | POA: Diagnosis not present

## 2022-11-21 DIAGNOSIS — R570 Cardiogenic shock: Secondary | ICD-10-CM | POA: Diagnosis not present

## 2022-11-21 DIAGNOSIS — I469 Cardiac arrest, cause unspecified: Secondary | ICD-10-CM | POA: Diagnosis not present

## 2022-11-21 LAB — GLUCOSE, CAPILLARY
Glucose-Capillary: 109 mg/dL — ABNORMAL HIGH (ref 70–99)
Glucose-Capillary: 123 mg/dL — ABNORMAL HIGH (ref 70–99)
Glucose-Capillary: 132 mg/dL — ABNORMAL HIGH (ref 70–99)
Glucose-Capillary: 31 mg/dL — CL (ref 70–99)
Glucose-Capillary: 34 mg/dL — CL (ref 70–99)
Glucose-Capillary: 68 mg/dL — ABNORMAL LOW (ref 70–99)
Glucose-Capillary: 70 mg/dL (ref 70–99)
Glucose-Capillary: 71 mg/dL (ref 70–99)
Glucose-Capillary: 74 mg/dL (ref 70–99)
Glucose-Capillary: 82 mg/dL (ref 70–99)
Glucose-Capillary: 89 mg/dL (ref 70–99)
Glucose-Capillary: 91 mg/dL (ref 70–99)
Glucose-Capillary: 93 mg/dL (ref 70–99)

## 2022-11-21 LAB — BASIC METABOLIC PANEL
Anion gap: 14 (ref 5–15)
BUN: 40 mg/dL — ABNORMAL HIGH (ref 8–23)
CO2: 21 mmol/L — ABNORMAL LOW (ref 22–32)
Calcium: 8.7 mg/dL — ABNORMAL LOW (ref 8.9–10.3)
Chloride: 100 mmol/L (ref 98–111)
Creatinine, Ser: 1.67 mg/dL — ABNORMAL HIGH (ref 0.61–1.24)
GFR, Estimated: 44 mL/min — ABNORMAL LOW (ref >60)
Glucose, Bld: 75 mg/dL (ref 70–99)
Potassium: 4.7 mmol/L (ref 3.5–5.1)
Sodium: 135 mmol/L (ref 135–145)

## 2022-11-21 LAB — RENAL FUNCTION PANEL
Albumin: 3 g/dL — ABNORMAL LOW (ref 3.5–5.0)
Anion gap: 10 (ref 5–15)
BUN: 39 mg/dL — ABNORMAL HIGH (ref 8–23)
CO2: 24 mmol/L (ref 22–32)
Calcium: 8.7 mg/dL — ABNORMAL LOW (ref 8.9–10.3)
Chloride: 102 mmol/L (ref 98–111)
Creatinine, Ser: 1.66 mg/dL — ABNORMAL HIGH (ref 0.61–1.24)
GFR, Estimated: 45 mL/min — ABNORMAL LOW (ref >60)
Glucose, Bld: 78 mg/dL (ref 70–99)
Phosphorus: 4.1 mg/dL (ref 2.5–4.6)
Potassium: 4.5 mmol/L (ref 3.5–5.1)
Sodium: 136 mmol/L (ref 135–145)

## 2022-11-21 LAB — CBC
HCT: 42.4 % (ref 39.0–52.0)
Hemoglobin: 13.2 g/dL (ref 13.0–17.0)
MCH: 26 pg (ref 26.0–34.0)
MCHC: 31.1 g/dL (ref 30.0–36.0)
MCV: 83.5 fL (ref 80.0–100.0)
Platelets: 109 10*3/uL — ABNORMAL LOW (ref 150–400)
RBC: 5.08 MIL/uL (ref 4.22–5.81)
RDW: 17.3 % — ABNORMAL HIGH (ref 11.5–15.5)
WBC: 7.8 10*3/uL (ref 4.0–10.5)
nRBC: 0 % (ref 0.0–0.2)

## 2022-11-21 LAB — MAGNESIUM: Magnesium: 2.6 mg/dL — ABNORMAL HIGH (ref 1.7–2.4)

## 2022-11-21 MED ORDER — EMPAGLIFLOZIN 10 MG PO TABS
10.0000 mg | ORAL_TABLET | Freq: Every day | ORAL | Status: DC
  Filled 2022-11-21 (×2): qty 1

## 2022-11-21 MED ORDER — SODIUM CHLORIDE 0.9 % IV SOLN
INTRAVENOUS | Status: DC | PRN
  Administered 2022-11-21: 240 mL via INTRAVENOUS

## 2022-11-21 MED ORDER — DEXTROSE 50 % IV SOLN
INTRAVENOUS | Status: AC
  Administered 2022-11-21: 25 mL
  Filled 2022-11-21: qty 50

## 2022-11-21 MED ORDER — OXYCODONE HCL 5 MG PO TABS
5.0000 mg | ORAL_TABLET | Freq: Four times a day (QID) | ORAL | Status: DC | PRN
  Administered 2022-11-21 – 2022-11-25 (×10): 5 mg via ORAL
  Filled 2022-11-21 (×10): qty 1

## 2022-11-21 MED ORDER — DEXTROSE 50 % IV SOLN
25.0000 mL | Freq: Once | INTRAVENOUS | Status: AC
  Administered 2022-11-21: 25 mL via INTRAVENOUS

## 2022-11-21 MED ORDER — ADULT MULTIVITAMIN W/MINERALS CH
1.0000 | ORAL_TABLET | Freq: Every day | ORAL | Status: DC
  Administered 2022-11-21 – 2022-11-26 (×6): 1 via ORAL
  Filled 2022-11-21 (×6): qty 1

## 2022-11-21 MED ORDER — POLYETHYLENE GLYCOL 3350 17 G PO PACK: 17.0000 g | PACK | Freq: Every day | ORAL | Status: DC | PRN

## 2022-11-21 MED ORDER — ASPIRIN 81 MG PO TBEC
81.0000 mg | DELAYED_RELEASE_TABLET | Freq: Every day | ORAL | Status: DC
  Administered 2022-11-22 – 2022-11-26 (×5): 81 mg via ORAL
  Filled 2022-11-21 (×5): qty 1

## 2022-11-21 MED ORDER — SODIUM CHLORIDE 0.9 % IV SOLN
3.0000 g | Freq: Once | INTRAVENOUS | Status: AC
  Administered 2022-11-21: 3 g via INTRAVENOUS
  Filled 2022-11-21: qty 8

## 2022-11-21 MED ORDER — EMPAGLIFLOZIN 10 MG PO TABS
10.0000 mg | ORAL_TABLET | Freq: Every day | ORAL | Status: DC
  Administered 2022-11-22 – 2022-11-26 (×5): 10 mg via ORAL
  Filled 2022-11-21 (×5): qty 1

## 2022-11-21 MED ORDER — ENSURE ENLIVE PO LIQD
237.0000 mL | Freq: Three times a day (TID) | ORAL | Status: DC
  Administered 2022-11-21 – 2022-11-26 (×12): 237 mL via ORAL

## 2022-11-21 MED ORDER — BUDESONIDE 0.5 MG/2ML IN SUSP
0.5000 mg | Freq: Two times a day (BID) | RESPIRATORY_TRACT | Status: DC
  Administered 2022-11-22 – 2022-11-26 (×9): 0.5 mg via RESPIRATORY_TRACT
  Filled 2022-11-21 (×10): qty 2

## 2022-11-21 NOTE — Progress Notes (Signed)
Nutrition Follow-up  DOCUMENTATION CODES:   Non-severe (moderate) malnutrition in context of chronic illness (likely component of social/environmental as well)  INTERVENTION:   Ensure Enlive po TID, each supplement provides 350 kcal and 20 grams of protein.  MVI with Minerals daily   NUTRITION DIAGNOSIS:   Moderate Malnutrition related to chronic illness as evidenced by moderate muscle depletion, moderate fat depletion.  Being addressed via diet advancement, supplements  GOAL:   Patient will meet greater than or equal to 90% of their needs  Progressing  MONITOR:   TF tolerance, Vent status, Labs, Weight trends  REASON FOR ASSESSMENT:   Ventilator, Consult Enteral/tube feeding initiation and management  ASSESSMENT:   67 yo male presents to ED after being found unresponsive in his car, PEA rhythm on arrival, CPR initiated, ROSC estimated 8 minutes. Intubated, cardiogenic shock, HF consulted. UDS + cocaine, THC, BZDs. +Rhinovirus. PMH includes CAD, CKD 3a, CHF, non-ischemic CM s/p ICD and pacemeker, cocaine abuse, HTN, COPD  4/07 Admitted 4/08 TF initiated 4/10 Extubated 4/11 Diet advanced to Dysphagia 3  Pt up wakling in hall with PT, Tx to floor today  Noted hypoglycemic while NPO  SLP re-evaluated today and able to advance diet to Dysphagia 3. Thins but no recorded po intake yet  Noted poor dentition with missing teeth  Wt this AM 84.5 kg  Labs: CBGs 34-109 Meds: reviewed  Diet Order:   Diet Order     None       EDUCATION NEEDS:   Not appropriate for education at this time  Skin:  Skin Assessment: Reviewed RN Assessment  Last BM:  4/11 type 6 large  Height:   Ht Readings from Last 1 Encounters:  11/17/22 6' (1.829 m)    Weight:   Wt Readings from Last 1 Encounters:  11/21/22 84.5 kg    BMI:  Body mass index is 25.27 kg/m.  Estimated Nutritional Needs:   Kcal:  1950-2150 kcals  Protein:  115-130 g  Fluid:  1.7 L    Romelle Starcher MS, RDN, LDN, CNSC Registered Dietitian 3 Clinical Nutrition RD Pager and On-Call Pager Number Located in [place]

## 2022-11-21 NOTE — Progress Notes (Addendum)
Advanced Heart Failure Rounding Note  PCP-Cardiologist: None   Subjective:   4/10 Extubated. Norepi weaned off.   Walking in the hall with PT.  Complaining of chest soreness.   Objective:   Weight Range: 84.5 kg Body mass index is 25.27 kg/m.   Vital Signs:   Temp:  [97.8 F (36.6 C)-98.1 F (36.7 C)] 98.1 F (36.7 C) (04/11 0809) Pulse Rate:  [67-102] 71 (04/11 0839) Resp:  [14-30] 21 (04/11 0819) BP: (87-141)/(57-94) 109/77 (04/11 0600) SpO2:  [91 %-99 %] 94 % (04/11 0839) Arterial Line BP: (79-154)/(55-90) 95/55 (04/11 0815) Weight:  [84.5 kg] 84.5 kg (04/11 0500) Last BM Date :  (PTA)  Weight change: Filed Weights   11/19/22 0500 11/20/22 0500 11/21/22 0500  Weight: 92.5 kg 90.5 kg 84.5 kg    Intake/Output:   Intake/Output Summary (Last 24 hours) at 11/21/2022 0853 Last data filed at 11/21/2022 0800 Gross per 24 hour  Intake 464.46 ml  Output 1630 ml  Net -1165.54 ml     Physical Exam    General:  Walking in the hall. No resp difficulty HEENT: normal Neck: supple. no JVD. Carotids 2+ bilat; no bruits. No lymphadenopathy or thryomegaly appreciated. Cor: PMI nondisplaced. Regular rate & rhythm. No rubs, gallops or murmurs. Lungs: Decreased on LLL on 2 liters  Abdomen: soft, nontender, nondistended. No hepatosplenomegaly. No bruits or masses. Good bowel sounds. Extremities: no cyanosis, clubbing, rash, edema Neuro: alert & orientedx3, cranial nerves grossly intact. moves all 4 extremities w/o difficulty. Affect pleasant  Telemetry  A sensed V paced 60s with NSVT  EKG    N/A   Labs    CBC Recent Labs    11/20/22 1132 11/21/22 0445  WBC 8.2 7.8  HGB 14.3 13.2  HCT 44.2 42.4  MCV 81.7 83.5  PLT 137* 109*   Basic Metabolic Panel Recent Labs    83/29/19 0409 11/20/22 1132 11/21/22 0445  NA 136 135 136  K 4.3 4.2 4.5  CL 100 100 102  CO2 20* 22 24  GLUCOSE 109* 128* 78  BUN 28* 31* 39*  CREATININE 1.41* 1.66* 1.66*  CALCIUM 8.9  8.9 8.7*  MG 2.4  --  2.6*  PHOS 3.8  --  4.1   Liver Function Tests Recent Labs    11/19/22 1717 11/20/22 1132 11/21/22 0445  AST 45* 68*  --   ALT 34 45*  --   ALKPHOS 95 106  --   BILITOT 1.2 1.4*  --   PROT 7.9 8.2*  --   ALBUMIN 3.1* 3.1* 3.0*   No results for input(s): "LIPASE", "AMYLASE" in the last 72 hours. Cardiac Enzymes Recent Labs    11/20/22 1132  CKTOTAL 1,079*    BNP: BNP (last 3 results) Recent Labs    04/04/22 1202 06/07/22 0142  BNP 212.5* 2,548.2*    ProBNP (last 3 results) No results for input(s): "PROBNP" in the last 8760 hours.   D-Dimer No results for input(s): "DDIMER" in the last 72 hours. Hemoglobin A1C No results for input(s): "HGBA1C" in the last 72 hours. Fasting Lipid Panel No results for input(s): "CHOL", "HDL", "LDLCALC", "TRIG", "CHOLHDL", "LDLDIRECT" in the last 72 hours.  Thyroid Function Tests No results for input(s): "TSH", "T4TOTAL", "T3FREE", "THYROIDAB" in the last 72 hours.  Invalid input(s): "FREET3"  Other results:   Imaging    [place] EKG SITE RITE  Result Date: 11/20/2022 If Site Rite image not attached, placement could not be confirmed due to current  cardiac rhythm.    Medications:     Scheduled Medications:  amiodarone  200 mg Oral Daily   arformoterol  15 mcg Nebulization BID   aspirin  325 mg Oral Daily   budesonide (PULMICORT) nebulizer solution  0.25 mg Nebulization BID   Chlorhexidine Gluconate Cloth  6 each Topical Daily   docusate sodium  100 mg Oral BID   famotidine  20 mg Oral BID   feeding supplement (PROSource TF20)  60 mL Per Tube Daily   heparin  5,000 Units Subcutaneous Q8H   mexiletine  150 mg Oral Q12H   mouth rinse  15 mL Mouth Rinse QID   polyethylene glycol  17 g Oral Daily   revefenacin  175 mcg Nebulization Daily    Infusions:  sodium chloride Stopped (11/17/22 2206)   ampicillin-sulbactam (UNASYN) IV 200 mL/hr at 11/21/22 0600   feeding supplement (VITAL AF 1.2 CAL)  Stopped (11/20/22 0920)   norepinephrine (LEVOPHED) Adult infusion Stopped (11/20/22 1507)    PRN Medications: ondansetron (ZOFRAN) IV, mouth rinse    Patient Profile     Edward Mullen is 67 year old with a history of HTN, DMI. CAD, VT, LBBB, BiV ICD, CKD Stage IIIa, polysubstance abuse, NIMC, and HFrEF. Followed in the HF clinic several years.     Admitted PEA arrest c/b cardiogenic shock.  Assessment/Plan   1. Out of hospital PEA arrest-->Cardiogenic Shock  -Found down. Unwitnessed. EMS rhythm PEA. CPR ROSC ~ 8 minutes.    2. Acute Respiratory Failure  -Intubated in ED --> extubated 4/12 - Getting Unasyn for possible aspiration pneumonia. Stopping today.  - Now on room air.   3. Chronic HFrEf, NICM -Suspected HTN/Cocaine Abuse. Has BiV - Echo EF 20-25% RV moderately reduced.  - Volume status stable.  - Failed swallow evaluation. Hold off on oral meds.    4. AKI -Creatinine baseline ~ 1.5  -Admit creatinine 2.1-->1.6 ->1.6->1.4 >1.66 -Follow daily BMET.    5. NSVT  K4.5 Mag 2.6   6. Polysubstance Abuse -UDS + cocaine + Benz+ THC -Will ask TOC to provide with substance abuse options.  -Discussed cessation.    7. Hyperkalemia  K 6.1 on admit. Resolved. 4.5 today.     Length of Stay: 4  Amy Clegg, NP  11/21/2022, 8:53 AM  Advanced Heart Failure Team Pager [phone] (M-F; 7a - 5p)  Please contact [facility] for night-coverage after hours (5p -7a ) and weekends on [email or web address]  Patient seen and examined with the above-signed Advanced Practice Provider and/or Housestaff. I personally reviewed laboratory data, imaging studies and relevant notes. I independently examined the patient and formulated the important aspects of the plan. I have edited the note to reflect any of my changes or salient points. I have personally discussed the plan with the patient and/or family.  Feels good. Chest sore from CPR. Denies SOB.   Rhythm stable.   General:  Weak appearing. No  resp difficulty HEENT: normal Neck: supple. no JVD. Carotids 2+ bilat; no bruits. No lymphadenopathy or thryomegaly appreciated. Cor: PMI nondisplaced. Regular rate & rhythm. No rubs, gallops or murmurs. Lungs: clear Abdomen: soft, nontender, nondistended. No hepatosplenomegaly. No bruits or masses. Good bowel sounds. Extremities: no cyanosis, clubbing, rash, edema Neuro: alert & orientedx3, cranial nerves grossly intact. moves all 4 extremities w/o difficulty. Affect pleasant  He is recovering. Will need to restart GDMT. Add SGLT2i today.  Arvilla Meres, MD  3:18 PM

## 2022-11-21 NOTE — Progress Notes (Addendum)
Speech Language Pathology Treatment: Dysphagia  Patient Details Name: Edward Mullen MRN: [id number] DOB: 12/18/1955 Today's Date: 11/21/2022 Time: 6803-2122 SLP Time Calculation (min) (ACUTE ONLY): 20 min  Assessment / Plan / Recommendation Clinical Impression  Pt seen for re-assessment of swallow function d/t nursing request as pt's mental status improved significantly from prior visit with SLP.  Pt consumed ice chips, thin via cup (sips), puree and soft solids without overt s/sx of aspiration noted until end of consumption with one incidence of delayed cough noted, but otherwise, pt did not have any overt signs during all intake.  Impaired mastication noted d/t limited dentition only.  Pt required min verbal cues to consume small sips of thin liquids, but he stated "I am hungry" during intake.  Pt did exhibit belching intermittently during intake suggesting potential esophageal component to swallowing.  Pt denied odynophagia (pain with swallowing) and was able to manage secretions which were problematic during last session with SLP.  Recommend initiating a Dysphagia 3(mechanical soft) diet with thin liquids using general swallowing precautions and medications whole with puree as tolerated.  ST will continue to f/u for diet tolerance/education during acute stay.  HPI HPI: This is a 67 year-old male patient with a known history of nonischemic-cardiomyopathy status post ICD and pacemaker.  Followed in the outpatient setting by [facility] cardiology.  Presents to the emergency room via EMS after being found unresponsive with agonal respiratory status in a parking lot.  His pupils were pinpoint.  He was given intramuscular Narcan by EMS.  On EMS arrival he was noted to be in PEA rhythm and CPR was initiated, time to return of spontaneous circulation estimated at 8 minutes.  On arrival to the emergency room he was agitated, coughing, reaching, moving all extremities but in no acute distress.  He was intubated  for airway protection, subsequently became severely hypotensive.  ETT 4/7-4/10/24.  BSE completed on 4/10 recommending ice chips d/t decreased mental status.  ST f/u for re-assessment of swallow function d/t mental status improvment per nursing.      SLP Plan  Goals updated      Recommendations for follow up therapy are one component of a multi-disciplinary discharge planning process, led by the attending physician.  Recommendations may be updated based on patient status, additional functional criteria and insurance authorization.    Recommendations  Diet recommendations: Dysphagia 3 (mechanical soft);Thin liquid Liquids provided via: Cup Medication Administration: Whole meds with puree Supervision: Patient able to self feed;Staff to assist with self feeding;Full supervision/cueing for compensatory strategies Compensations: Slow rate;Small sips/bites Postural Changes and/or Swallow Maneuvers: Seated upright 90 degrees;Upright 30-60 min after meal                Other(comment) (TBD) Oral care BID;Staff/trained caregiver to provide oral care   Intermittent Supervision/Assistance Dysphagia, oropharyngeal phase (R13.12)     Goals updated     Edward Mullen,M.S., CCC-SLP  11/21/2022, 1:25 PM

## 2022-11-21 NOTE — Evaluation (Signed)
Physical Therapy Evaluation Patient Details Name: Edward Mullen MRN: [id number] DOB: 03/07/1956 Today's Date: 11/21/2022  History of Present Illness  67 yo male admitted 4/7 after found unresponsive in parking lot with PEA arrest and ROSC after 8 min. Extubated 4/10. PMHx: HTN, T2DM, CHF, polysubstance abuse, NICM, CKD, COPD, HLD, ICD and PPM  Clinical Impression  Pt pleasant in chair on arrival and able to walk limited hall distance on RA with SPO2 98% and HR 84. Pt educated for situation and date. Pt lives with family and reports assist available at D/C. Pt with decreased strength, transfers, function and mobility who will benefit from acute therapy to maximize mobility, safety and independence to decrease burden of care.         Recommendations for follow up therapy are one component of a multi-disciplinary discharge planning process, led by the attending physician.  Recommendations may be updated based on patient status, additional functional criteria and insurance authorization.  Follow Up Recommendations       Assistance Recommended at Discharge Set up Supervision/Assistance  Patient can return home with the following  A little help with walking and/or transfers;A little help with bathing/dressing/bathroom;Assist for transportation;Assistance with cooking/housework    Equipment Recommendations None recommended by PT  Recommendations for Other Services       Functional Status Assessment Patient has had a recent decline in their functional status and demonstrates the ability to make significant improvements in function in a reasonable and predictable amount of time.     Precautions / Restrictions Precautions Precautions: Fall      Mobility  Bed Mobility               General bed mobility comments: OOB on arrival and end of session    Transfers Overall transfer level: Needs assistance   Transfers: Sit to/from Stand Sit to Stand: Min assist            General transfer comment: min assist to rise from chair x 2 with cues for scooting to EOB and anterior translation    Ambulation/Gait Ambulation/Gait assistance: Min guard Gait Distance (Feet): 80 Feet Assistive device: Rolling walker (2 wheels) Gait Pattern/deviations: Step-through pattern, Decreased stride length, Trunk flexed   Gait velocity interpretation: 1.31 - 2.62 ft/sec, indicative of limited community ambulator   General Gait Details: cues for posture, proximity to RW and Wellsite geologist    Modified Rankin (Stroke Patients Only)       Balance Overall balance assessment: Needs assistance   Sitting balance-Leahy Scale: Fair     Standing balance support: Bilateral upper extremity supported, Reliant on assistive device for balance Standing balance-Leahy Scale: Poor Standing balance comment: bil UE support on RW                             Pertinent Vitals/Pain Pain Assessment Pain Score: 7  Pain Location: chest from compressions Pain Descriptors / Indicators: Grimacing, Discomfort Pain Intervention(s): Limited activity within patient's tolerance, Repositioned, Monitored during session    Home Living Family/patient expects to be discharged to:: Private residence Living Arrangements: Children Available Help at Discharge: Family;Available 24 hours/day Type of Home: Apartment Home Access: Level entry       Home Layout: One level Home Equipment: Agricultural consultant (2 wheels);Cane - single point      Prior Function Prior Level of Function : Independent/Modified Independent  Mobility Comments: walks with cane       Hand Dominance        Extremity/Trunk Assessment   Upper Extremity Assessment Upper Extremity Assessment: Generalized weakness    Lower Extremity Assessment Lower Extremity Assessment: Generalized weakness    Cervical / Trunk Assessment Cervical / Trunk Assessment: Kyphotic   Communication   Communication: No difficulties  Cognition Arousal/Alertness: Awake/alert Behavior During Therapy: WFL for tasks assessed/performed Overall Cognitive Status: Within Functional Limits for tasks assessed                                          General Comments      Exercises     Assessment/Plan    PT Assessment Patient needs continued PT services  PT Problem List Decreased strength;Decreased mobility;Decreased activity tolerance;Decreased balance;Decreased knowledge of use of DME;Pain       PT Treatment Interventions DME instruction;Therapeutic activities;Cognitive remediation;Gait training;Stair training;Functional mobility training;Balance training;Therapeutic exercise;Patient/family education    PT Goals (Current goals can be found in the Care Plan section)  Acute Rehab PT Goals Patient Stated Goal: return home PT Goal Formulation: With patient Time For Goal Achievement: 12/05/22 Potential to Achieve Goals: Good    Frequency Min 1X/week     Co-evaluation               AM-PAC PT "6 Clicks" Mobility  Outcome Measure Help needed turning from your back to your side while in a flat bed without using bedrails?: A Little Help needed moving from lying on your back to sitting on the side of a flat bed without using bedrails?: A Little Help needed moving to and from a bed to a chair (including a wheelchair)?: A Little Help needed standing up from a chair using your arms (e.g., wheelchair or bedside chair)?: A Little Help needed to walk in hospital room?: A Little Help needed climbing 3-5 steps with a railing? : A Lot 6 Click Score: 17    End of Session   Activity Tolerance: Patient tolerated treatment well Patient left: in chair;with chair alarm set;with call bell/phone within reach Nurse Communication: Mobility status PT Visit Diagnosis: Other abnormalities of gait and mobility (R26.89)    Time: 6195-0932 PT Time Calculation  (min) (ACUTE ONLY): 28 min   Charges:   PT Evaluation $PT Eval Moderate Complexity: 1 Mod PT Treatments $Gait Training: 8-22 mins        Merryl Hacker, PT Acute Rehabilitation Services Office: [phone]   Enedina Finner Lani Mendiola 11/21/2022, 9:09 AM

## 2022-11-21 NOTE — Progress Notes (Signed)
NAME:  Edward Mullen, MRN:  [id number], DOB:  1956/03/26, LOS: 4 ADMISSION DATE:  11/17/2022, CONSULTATION DATE:  11/17/2022 REFERRING MD:  Kommor - EDP, CHIEF COMPLAINT:  cardiac arrest   History of Present Illness:  67 year-old man patient with a known history of nonischemic-cardiomyopathy status post ICD and pacemaker.  Followed in the outpatient setting by [facility] Cardiology.  Presents to the emergency room via EMS after being found unresponsive with agonal respiratory status in a parking lot.  His pupils were pinpoint.  He was given intramuscular Narcan by EMS.  On EMS arrival he was noted to be in PEA rhythm and CPR was initiated, time to return of spontaneous circulation estimated at 8 minutes.  On arrival to the emergency room he was agitated, coughing, reaching, moving all extremities but in no acute distress.  He was intubated for airway protection, subsequently became severely hypotensive.  Had copious amounts of frothy bloody sputum from endotracheal tube.  Central venous access was placed as was arterial access.  He was started on norepinephrine and then later epinephrine infusion on critical care arrival critical care was asked to admit.  Pertinent Medical History:   Past Medical History:  Diagnosis Date   Arthritis    "feels like it in my legs" (09/20/2014)   CAD (coronary artery disease)    Chronic kidney disease, stage 3a    Chronic systolic CHF (congestive heart failure)    Cocaine abuse    Hypertension    NICM (nonischemic cardiomyopathy)    NSVT (nonsustained ventricular tachycardia) 09/06/2016   pre diabetes    patient denies   Significant Hospital Events: Including procedures, antibiotic start and stop dates in addition to other pertinent events   4/7 Admitted status post 8-minute cardiac arrest initial rhythm PEA/paced. Narcan given by EMS. Intubated in the emergency room, subsequent shock. HF consulted. UDS + cocaine, BZDs, THC. Trops mildly elevated. LA peaked >  9.0. Resp Cx few gram+ cocci (pairs, clusters), rare GNRs. 4/8 Runs of VT overnight, nonsustained + PVCs. Electrolytes repleted. +Rhinovirus on RVP. Echo EF 20-25%, RV moderately reduced. Co-ox 74%. 4/9 Sedation weaned this AM, wide awake/following commands, agitated/uncomfortable. Failed SBT due to tachypnea/low volumes, though this was in the setting of agitation. ?Pain from rib fractures/sternum fracture. Co-ox 55.7% (fem).  4/10 extubated, weaned off NE, CVP 4-5, ongoing diureses, CVL out  Interim History / Subjective:  Failed bedside SLP yest Ambulated in hall w/PT Initially some chest soreness and increased WOB after walking Denies other complaints  Objective:  Blood pressure 109/77, pulse 71, temperature 98.1 F (36.7 C), temperature source Oral, resp. rate (!) 21, height 6' (1.829 m), weight 84.5 kg, SpO2 94 %. CVP:  [0 mmHg-34 mmHg] 0 mmHg      Intake/Output Summary (Last 24 hours) at 11/21/2022 0915 Last data filed at 11/21/2022 0800 Gross per 24 hour  Intake 363.99 ml  Output 1305 ml  Net -941.01 ml   Filed Weights   11/19/22 0500 11/20/22 0500 11/21/22 0500  Weight: 92.5 kg 90.5 kg 84.5 kg   Physical Examination: General:  pleasant elderly male sitting in bedside recliner HEENT: MM pink/moist, pupils 3/r, anicteric, poor dentition Neuro: Aox3, MAE CV: rr, NSR 60's, no murmur PULM:  non labored, diminished, few rhonchi, no wheeze, strong cough- productive white, RA 96% GI: soft, bs+, NT, voids  Extremities: warm/dry, no LE edema  Skin: no rashes   UOP 1.6L/ 24hrs  -1L /24hrs Net -3.6L Labs reviewed> sCr 1.41> 1.66, K 4.5,  Mag 2.6, plts 110> 129> 109 2 Bms thus far today   Resolved Hospital Problem List:  Mild leukocytosis Lactic acidosis in the setting of shock, resolved  Assessment & Plan:  Cardiac arrest.  PEA/with paced rhythm Time resuscitation estimated at 8 minutes. Echo 4/8 with EF 20-25%. UDS +cocaine, BZDs, THC. EP interrogated PPM with no events  recorded. Suspect combination of hypoxic event + cocaine use. - remains hemodynamically stable and neurologically intact  - cont supportive care - given musculoskeletal soreness, oxy IR prn w/ bowel regimen  Cardiogenic shock status postcardiac arrest, with known history of underlying nonischemic cardiomyopathy/ HFrEF NSVT Prolonged QTc Patient has a known underlying cardiomyopathy with a EF of 20 to 25%.  CT Chest/A/P demonstrated extensive GGOs bilaterally, ?pulmonary edema vs. contusions; progressive cardiomegaly, multiple rib fractures/distal sternum fracture. - Goal MAP > 65, remains off pressors  - appreciate HF assistance  - d/c aline - defer additional diuresis to HF, hold today   - cont amio/ mexiletine per HF>  monitor Qtc.  510 on admit, 520 yest> ok to continue as risk outweigh benefits  - optimize electrolytes  - strict I/Os, weights  - defer further GDMT to HF - Trend WBC, fever curve, completes abx today   Acute hypoxic respiratory failure status postcardiac arrest and complicated by diffuse pulmonary edema +Rhinovirus infection Possible aspiration PNA History of COPD - extubated 4/10, doing well thus far, still with increased secretions but great cough clearance - failed SLP 4/10.  Passed today with dysphagia 3 (due to poor dentition), cont aspiration precautions - prn O2 for sat goal > 92% - completes day 5 of aspiration coverage - cont BD, Brovana/Yupelri, Pulmicort - cont aggressive pulmonary hygiene > IS/ mobilize, PT/ OT   Acute metabolic and possibly toxic encephalopathy.  EEG 4/8 with severe diffuse encephalopathy, no seizures.CT Head NAICA, parenchymal atrophy/chronic small vessel disease. - resolved.  Cont supportive care  Acute on chronic renal failure.  Baseline CKD stage IIIa Hyperkalemia, resolved  CT A/P with 87mm nonobstructing renal calculus (R). - sCr stable - serial renal indices  - strict I/Os, daily wts - Avoid nephrotoxic agents as able -  Ensure adequate renal perfusion  Enlarged prostate CT A/P 4/7 with markedly enlarged prostate gland protruding into bladder base. - PSA 2.32 (stable from 1 year prior) - Outpatient f/u - voiding w/o difficulty  Polysubstance abuse UDS + cocaine, BZD, THC. - did not need precedex.  No signs of withdrawal at this time.  monitor - Encourage cessation> states is quitting - TOC consult as appropriate  Thrombocytopenia - stable, monitor  - no evidence of bleeding - will reduce ASA to 81mg  daily  Best Practice (right click and "Reselect all SmartList Selections" daily)   Diet/type: NPO> per SLP DVT prophylaxis: SCD; heparin SQ GI prophylaxis: PPI Lines: Central line Foley:  Yes, and it is still needed Code Status:  full code Last date of multidisciplinary goals of care discussion [pending ]  Will transfer to PCU/ HF bed and to [facility] as of 4/12, then PCCM will be available as needed  Critical care time:     Posey Boyer, NP [facility] 11/21/22 9:15 AM  Please see [email or web address] for pager details.  From 7A-7P if no response, please call [phone] After hours, please call ELink [phone]

## 2022-11-21 NOTE — TOC Progression Note (Signed)
Transition of Care [facility]) - Progression Note    Patient Details  Name: Edward Mullen MRN: [id number] Date of Birth: [birth year]  Transition of [facility]) CM/SW Contact  [name] Cousin, RN Phone Number: 11/21/2022, 12:02 PM  Clinical Narrative:     CM spoke to pt and offered choice for [facility]. Agreeable to [facility] for HH. Provided pt with resources for substance abuse counseling. States he is ready to quit. Will be going back to his dtrs home at dc.   Expected Discharge Plan: Home w Home Health Services Barriers to Discharge: Continued Medical Work up  Expected Discharge Plan and Services   Discharge Planning Services: CM Consult Post Acute Care Choice: Home Health Living arrangements for the past 2 months: Single Family Home                    Social Determinants of Health (SDOH) Interventions SDOH Screenings   Food Insecurity: No Food Insecurity (06/17/2022)  Recent Concern: Food Insecurity - Food Insecurity Present (06/08/2022)  Housing: Low Risk  (06/17/2022)  Transportation Needs: No Transportation Needs (06/17/2022)  Recent Concern: Transportation Needs - Unmet Transportation Needs (06/08/2022)  Utilities: Not At Risk (06/17/2022)  Alcohol Screen: Low Risk  (06/17/2022)  Depression (PHQ2-9): Low Risk  (06/17/2022)  Financial Resource Strain: Low Risk  (06/17/2022)  Physical Activity: Inactive (06/17/2022)  Social Connections: Moderately Isolated (06/17/2022)  Stress: No Stress Concern Present (06/17/2022)  Tobacco Use: High Risk (11/17/2022)    Readmission Risk Interventions     No data to display

## 2022-11-21 NOTE — Progress Notes (Signed)
Notified Dr. Merrily Pew that blood sugar is 74 and that patient's O2 is in high 80s/ MD states to hold Jardiance and to start on 1-2 L of oxygen. Both are completed. Will continue to monitor.

## 2022-11-21 NOTE — Plan of Care (Signed)
  Problem: Education: Goal: Knowledge of General Education information will improve Description: Including pain rating scale, medication(s)/side effects and non-pharmacologic comfort measures Outcome: Progressing   Problem: Health Behavior/Discharge Planning: Goal: Ability to manage health-related needs will improve Outcome: Progressing   Problem: Clinical Measurements: Goal: Respiratory complications will improve Outcome: Progressing   Problem: Clinical Measurements: Goal: Cardiovascular complication will be avoided Outcome: Progressing   Problem: Activity: Goal: Risk for activity intolerance will decrease Outcome: Progressing   Problem: Nutrition: Goal: Adequate nutrition will be maintained Outcome: Progressing   Problem: Coping: Goal: Level of anxiety will decrease Outcome: Progressing   Problem: Safety: Goal: Ability to remain free from injury will improve Outcome: Progressing   Problem: Skin Integrity: Goal: Risk for impaired skin integrity will decrease Outcome: Progressing   Problem: Pain Managment: Goal: General experience of comfort will improve Outcome: Progressing

## 2022-11-21 NOTE — Plan of Care (Signed)

## 2022-11-22 ENCOUNTER — Telehealth (HOSPITAL_COMMUNITY): Payer: Self-pay | Admitting: Pharmacy Technician

## 2022-11-22 ENCOUNTER — Other Ambulatory Visit (HOSPITAL_COMMUNITY): Payer: Self-pay

## 2022-11-22 DIAGNOSIS — I469 Cardiac arrest, cause unspecified: Secondary | ICD-10-CM | POA: Diagnosis not present

## 2022-11-22 LAB — CBC
HCT: 43.1 % (ref 39.0–52.0)
Hemoglobin: 13.6 g/dL (ref 13.0–17.0)
MCH: 27.3 pg (ref 26.0–34.0)
MCHC: 31.6 g/dL (ref 30.0–36.0)
MCV: 86.4 fL (ref 80.0–100.0)
Platelets: 145 10*3/uL — ABNORMAL LOW (ref 150–400)
RBC: 4.99 MIL/uL (ref 4.22–5.81)
RDW: 17.9 % — ABNORMAL HIGH (ref 11.5–15.5)
WBC: 6.7 10*3/uL (ref 4.0–10.5)
nRBC: 0 % (ref 0.0–0.2)

## 2022-11-22 LAB — GLUCOSE, CAPILLARY
Glucose-Capillary: 100 mg/dL — ABNORMAL HIGH (ref 70–99)
Glucose-Capillary: 110 mg/dL — ABNORMAL HIGH (ref 70–99)
Glucose-Capillary: 70 mg/dL (ref 70–99)
Glucose-Capillary: 88 mg/dL (ref 70–99)
Glucose-Capillary: 89 mg/dL (ref 70–99)
Glucose-Capillary: 96 mg/dL (ref 70–99)

## 2022-11-22 LAB — BASIC METABOLIC PANEL
Anion gap: 14 (ref 5–15)
BUN: 38 mg/dL — ABNORMAL HIGH (ref 8–23)
CO2: 21 mmol/L — ABNORMAL LOW (ref 22–32)
Calcium: 8.8 mg/dL — ABNORMAL LOW (ref 8.9–10.3)
Chloride: 99 mmol/L (ref 98–111)
Creatinine, Ser: 1.59 mg/dL — ABNORMAL HIGH (ref 0.61–1.24)
GFR, Estimated: 47 mL/min — ABNORMAL LOW (ref >60)
Glucose, Bld: 82 mg/dL (ref 70–99)
Potassium: 4.6 mmol/L (ref 3.5–5.1)
Sodium: 134 mmol/L — ABNORMAL LOW (ref 135–145)

## 2022-11-22 LAB — CULTURE, BLOOD (ROUTINE X 2)
Culture: NO GROWTH
Culture: NO GROWTH
Special Requests: ADEQUATE

## 2022-11-22 LAB — MAGNESIUM: Magnesium: 2.5 mg/dL — ABNORMAL HIGH (ref 1.7–2.4)

## 2022-11-22 MED ORDER — SPIRONOLACTONE 25 MG PO TABS
12.5000 mg | ORAL_TABLET | Freq: Every day | ORAL | 0 refills | Status: DC
Start: 2022-11-22 — End: 2022-11-26
  Filled 2022-11-22: qty 30, 60d supply, fill #0

## 2022-11-22 MED ORDER — EMPAGLIFLOZIN 10 MG PO TABS
10.0000 mg | ORAL_TABLET | Freq: Every day | ORAL | 0 refills | Status: DC
Start: 2022-11-22 — End: 2022-11-26
  Filled 2022-11-22: qty 30, 30d supply, fill #0

## 2022-11-22 MED ORDER — AMIODARONE HCL 200 MG PO TABS
200.0000 mg | ORAL_TABLET | Freq: Every day | ORAL | 0 refills | Status: DC
  Filled 2022-11-22: qty 30, 30d supply, fill #0

## 2022-11-22 MED ORDER — FUROSEMIDE 40 MG PO TABS: 40.0000 mg | ORAL_TABLET | Freq: Every day | ORAL | Status: DC

## 2022-11-22 MED ORDER — TORSEMIDE 20 MG PO TABS
20.0000 mg | ORAL_TABLET | Freq: Every day | ORAL | 0 refills | Status: DC
  Filled 2022-11-22: qty 30, 30d supply, fill #0

## 2022-11-22 MED ORDER — FUROSEMIDE 10 MG/ML IJ SOLN
80.0000 mg | Freq: Once | INTRAMUSCULAR | Status: AC
  Administered 2022-11-22: 80 mg via INTRAVENOUS
  Filled 2022-11-22: qty 8

## 2022-11-22 MED ORDER — SPIRONOLACTONE 12.5 MG HALF TABLET
12.5000 mg | ORAL_TABLET | Freq: Every day | ORAL | Status: DC
  Administered 2022-11-22 – 2022-11-25 (×4): 12.5 mg via ORAL
  Filled 2022-11-22 (×4): qty 1

## 2022-11-22 MED ORDER — TORSEMIDE 20 MG PO TABS
20.0000 mg | ORAL_TABLET | Freq: Every day | ORAL | Status: DC
  Administered 2022-11-23 – 2022-11-26 (×3): 20 mg via ORAL
  Filled 2022-11-22 (×3): qty 1

## 2022-11-22 NOTE — Progress Notes (Signed)
Spoke to the [employer]  (wife) at bedside. She requested for substance abuse counseling and be updated via phone call at [phone].

## 2022-11-22 NOTE — Progress Notes (Addendum)
Advanced Heart Failure Rounding Note  PCP-Cardiologist: None   Subjective:   4/10 Extubated. Norepi weaned off.   Complaining about chest discomfort from chest compressions. Otherwise feels fine.   Objective:   Weight Range: 89.6 kg Body mass index is 26.79 kg/m.   Vital Signs:   Temp:  [97.7 F (36.5 C)-98.3 F (36.8 C)] 98.1 F (36.7 C) (04/12 0438) Pulse Rate:  [67-79] 68 (04/12 0438) Resp:  [17-27] 18 (04/12 0438) BP: (90-117)/(64-83) 95/64 (04/12 0438) SpO2:  [85 %-97 %] 97 % (04/12 0730) Arterial Line BP: (95-110)/(55-65) 110/65 (04/11 1030) Weight:  [88.4 kg-89.6 kg] 89.6 kg (04/12 0438) Last BM Date : 11/21/22  Weight change: Filed Weights   11/21/22 0500 11/21/22 1617 11/22/22 0438  Weight: 84.5 kg 88.4 kg 89.6 kg    Intake/Output:   Intake/Output Summary (Last 24 hours) at 11/22/2022 0756 Last data filed at 11/22/2022 0445 Gross per 24 hour  Intake 897.6 ml  Output 1200 ml  Net -302.4 ml     Physical Exam    General:  well appearing.  No respiratory difficulty HEENT: normal Neck: supple. JVD ~7 cm. Carotids 2+ bilat; no bruits. No lymphadenopathy or thyromegaly appreciated. Cor: PMI nondisplaced. Regular rate & rhythm. No rubs, gallops or murmurs. Lungs: coarse bases Abdomen: soft, nontender, nondistended. No hepatosplenomegaly. No bruits or masses. Good bowel sounds. Extremities: no cyanosis, clubbing, rash, edema  Neuro: alert & oriented x 3, cranial nerves grossly intact. moves all 4 extremities w/o difficulty. Affect pleasant.   Telemetry  A sensed V paced 70s (Personally reviewed)    EKG    N/A   Labs    CBC Recent Labs    11/21/22 0445 11/22/22 0116  WBC 7.8 6.7  HGB 13.2 13.6  HCT 42.4 43.1  MCV 83.5 86.4  PLT 109* 145*   Basic Metabolic Panel Recent Labs    91/47/82 0409 11/20/22 1132 11/21/22 0445 11/21/22 1139 11/22/22 0116  NA 136   < > 136 135 134*  K 4.3   < > 4.5 4.7 4.6  CL 100   < > 102 100 99  CO2 20*    < > 24 21* 21*  GLUCOSE 109*   < > 78 75 82  BUN 28*   < > 39* 40* 38*  CREATININE 1.41*   < > 1.66* 1.67* 1.59*  CALCIUM 8.9   < > 8.7* 8.7* 8.8*  MG 2.4  --  2.6*  --  2.5*  PHOS 3.8  --  4.1  --   --    < > = values in this interval not displayed.   Liver Function Tests Recent Labs    11/19/22 1717 11/20/22 1132 11/21/22 0445  AST 45* 68*  --   ALT 34 45*  --   ALKPHOS 95 106  --   BILITOT 1.2 1.4*  --   PROT 7.9 8.2*  --   ALBUMIN 3.1* 3.1* 3.0*   No results for input(s): "LIPASE", "AMYLASE" in the last 72 hours. Cardiac Enzymes Recent Labs    11/20/22 1132  CKTOTAL 1,079*    BNP: BNP (last 3 results) Recent Labs    04/04/22 1202 06/07/22 0142  BNP 212.5* 2,548.2*    ProBNP (last 3 results) No results for input(s): "PROBNP" in the last 8760 hours.   D-Dimer No results for input(s): "DDIMER" in the last 72 hours. Hemoglobin A1C No results for input(s): "HGBA1C" in the last 72 hours. Fasting Lipid Panel No results  for input(s): "CHOL", "HDL", "LDLCALC", "TRIG", "CHOLHDL", "LDLDIRECT" in the last 72 hours.  Thyroid Function Tests No results for input(s): "TSH", "T4TOTAL", "T3FREE", "THYROIDAB" in the last 72 hours.  Invalid input(s): "FREET3"  Other results:   Imaging    No results found.   Medications:     Scheduled Medications:  amiodarone  200 mg Oral Daily   arformoterol  15 mcg Nebulization BID   aspirin EC  81 mg Oral Daily   budesonide (PULMICORT) nebulizer solution  0.5 mg Nebulization BID   docusate sodium  100 mg Oral BID   empagliflozin  10 mg Oral Daily   feeding supplement  237 mL Oral TID BM   furosemide  80 mg Intravenous Once   [START ON 11/23/2022] furosemide  40 mg Oral Daily   heparin  5,000 Units Subcutaneous Q8H   mexiletine  150 mg Oral Q12H   multivitamin with minerals  1 tablet Oral Daily   mouth rinse  15 mL Mouth Rinse QID   revefenacin  175 mcg Nebulization Daily    Infusions:  sodium chloride Stopped  (11/21/22 1614)    PRN Medications: sodium chloride, mouth rinse, oxyCODONE, polyethylene glycol    Patient Profile     Edward Mullen is 67 year old with a history of HTN, DMI. CAD, VT, LBBB, BiV ICD, CKD Stage IIIa, polysubstance abuse, NIMC, and HFrEF. Followed in the HF clinic several years.     Admitted PEA arrest c/b cardiogenic shock.  Assessment/Plan   1. Out of hospital PEA arrest-->Cardiogenic Shock  - Found down. Unwitnessed. EMS rhythm PEA. CPR ROSC ~ 8 minutes.    2. Acute Respiratory Failure  - Intubated in ED --> extubated 4/12 - Getting Unasyn for possible aspiration pneumonia. Stopping today.  - Now on room air.   3. Chronic HFrEf, NICM -Suspected HTN/Cocaine Abuse. Has BiV - Echo EF 20-25% RV moderately reduced.  - Coarse on exam, will do 80 IV lasix x1 today, follow response.  - Continue Jardiance 10 mg daily, held yesterday with hypoglycemia - Start spiro 12.5 mg daily, monitor with hx hyperkalemia on admission   4. AKI -Creatinine baseline ~ 1.5  -Admit creatinine 2.1-->1.6 ->1.6->1.4 >1.66-> 1.59 -Follow daily BMET.    5. NSVT  with h/o VT - K 4.6 Mag 2.5 - Continue mexilitene   6. Polysubstance Abuse -UDS + cocaine + Benz+ THC -Will ask TOC to provide with substance abuse options.  -Discussed cessation.    7. Hyperkalemia  - K 6.1 on admit. Resolved. 4.6 today.   Length of Stay: 5  Alen Bleacher, NP  11/22/2022, 7:56 AM  Advanced Heart Failure Team Pager [phone] (M-F; 7a - 5p)  Please contact [facility] for night-coverage after hours (5p -7a ) and weekends on [email or web address]  Patient seen and examined with the above-signed Advanced Practice Provider and/or Housestaff. I personally reviewed laboratory data, imaging studies and relevant notes. I independently examined the patient and formulated the important aspects of the plan. I have edited the note to reflect any of my changes or salient points. I have personally discussed the plan with the  patient and/or family.  Had some resp distress overnight with sats down to 85%. This am feels ok. Only complaint is chest soreness from CPR.   General:  Weak appearing.  HEENT: normal Neck: supple. JVP 9-10 Carotids 2+ bilat; no bruits. No lymphadenopathy or thryomegaly appreciated. Cor: PMI nondisplaced. Regular rate & rhythm. No rubs, gallops or murmurs. Lungs: clear Abdomen: soft,  nontender, nondistended. No hepatosplenomegaly. No bruits or masses. Good bowel sounds. Extremities: no cyanosis, clubbing, rash, edema Neuro: alert & orientedx3, cranial nerves grossly intact. moves all 4 extremities w/o difficulty. Affect pleasant  Appears volume overloaded. Will give lasix 80 IV today and then start 40 daily. Agree with spiro though will need to watch potassium closely.   Arvilla Meres, MD  10:27 AM

## 2022-11-22 NOTE — Progress Notes (Signed)
Speech Language Pathology Treatment: Dysphagia  Patient Details Name: Edward Mullen MRN: [id number] DOB: Apr 01, 1956 Today's Date: 11/22/2022 Time: 1829-9371 SLP Time Calculation (min) (ACUTE ONLY): 8 min  Assessment / Plan / Recommendation Clinical Impression  Pt seen for dysphagia sitting upright in chair and states he sometimes coughs when he eats but denies recently. Today he did not exhibit any s/s aspiration with consecutive straw sips thin. He has limited dentition (4 teeth) however able to masticate regular texture timely and without residue. Given he is somewhat deconditioned from hospitalization and decreased dentition pt agreed that Dys 3 (chopped meats) was appropriate to continue while in hospital and can upgrade to regular once he is discharged to next venue of care or home. Continue thin liquids, pills with thin. No further ST needed.   HPI HPI: This is a 67 year-old male patient with a known history of nonischemic-cardiomyopathy status post ICD and pacemaker.  Followed in the outpatient setting by [facility] cardiology.  Presents to the emergency room via EMS after being found unresponsive with agonal respiratory status in a parking lot.  His pupils were pinpoint.  He was given intramuscular Narcan by EMS.  On EMS arrival he was noted to be in PEA rhythm and CPR was initiated, time to return of spontaneous circulation estimated at 8 minutes.  On arrival to the emergency room he was agitated, coughing, reaching, moving all extremities but in no acute distress.  He was intubated for airway protection, subsequently became severely hypotensive.  ETT 4/7-4/10/24.  BSE completed on 4/10 recommending ice chips d/t decreased mental status.  ST f/u for re-assessment of swallow function d/t mental status improvment per nursing.      SLP Plan  Discharge SLP treatment due to (comment)      Recommendations for follow up therapy are one component of a multi-disciplinary discharge planning  process, led by the attending physician.  Recommendations may be updated based on patient status, additional functional criteria and insurance authorization.    Recommendations  Diet recommendations: Dysphagia 3 (mechanical soft);Thin liquid Liquids provided via: Straw;Cup Medication Administration: Whole meds with liquid Supervision: Patient able to self feed;Staff to assist with self feeding;Full supervision/cueing for compensatory strategies Compensations: Slow rate;Small sips/bites Postural Changes and/or Swallow Maneuvers: Seated upright 90 degrees;Upright 30-60 min after meal                  Oral care BID   Intermittent Supervision/Assistance Dysphagia, oropharyngeal phase (R13.12)     Discharge SLP treatment due to (comment)     Royce Macadamia  11/22/2022, 12:04 PM

## 2022-11-22 NOTE — TOC Benefit Eligibility Note (Signed)
Patient Product/process development scientist completed.    The patient is currently admitted and upon discharge could be taking Entresto 24-26 mg.  The current 30 day co-pay is $0.00.   The patient is insured through [employer] Part D   This test claim was processed through [facility]- copay amounts may vary at other pharmacies due to pharmacy/plan contracts, or as the patient moves through the different stages of their insurance plan.  Roland Earl, CPHT Pharmacy Patient Advocate Specialist [facility] Patient Advocate Team Direct Number: [phone]  Fax: [phone]

## 2022-11-22 NOTE — TOC Transition Note (Signed)
Discharge medications (4) are being stored in the main pharmacy on the ground floor until patient is ready for discharge.   

## 2022-11-22 NOTE — TOC CM/SW Note (Signed)
Patient will benefit from a Rolling Walker with seat for long distance walking, being able to rest is beneficial with CHF and COPD patient.

## 2022-11-22 NOTE — Care Management Important Message (Signed)
Important Message  Patient Details  Name: Edward Mullen MRN: [id number] Date of Birth: [birth year]   Medicare Important Message Given:  Yes     Renie Ora 11/22/2022, 2:55 PM

## 2022-11-22 NOTE — Progress Notes (Signed)
Mobility Specialist Progress Note:   11/22/22 0945  Mobility  Activity Ambulated with assistance in hallway  Level of Assistance Contact guard assist, steadying assist  Assistive Device Front wheel walker  Distance Ambulated (ft) 200 ft  Activity Response Tolerated well  Mobility Referral Yes  $Mobility charge 1 Mobility   Pt eager for mobility session. Required contact assist throughout session. SpO2 WFL on 2LO2. HR 88bpm. Pt left in bed with all needs met, alarm on.  Addison Lank Mobility Specialist Please contact via SecureChat or  Rehab office at [phone]

## 2022-11-22 NOTE — TOC Initial Note (Addendum)
Transition of Care [facility]) - Initial/Assessment Note    Patient Details  Name: Edward Mullen MRN: [id number] Date of Birth: Dec 25, 1955  Transition of Care [facility]) CM/SW Contact:    [name] Cousin, RN Phone Number: [phone] 11/22/2022, 4:42 PM  Clinical Narrative:                 CM spoke to pt and dtr, [name]. States she is not aware pt has RW with seat. Dtr states she will provide transportation home. Pt has scale, and neb machine at home. Pt states he has a RW. Contacted [employer] for Rollator for home. HH arranged with [facility].   [address] [place], [place] [address]  Expected Discharge Plan: Home w Home Health Services Barriers to Discharge: Continued Medical Work up   Patient Goals and CMS Choice Patient states their goals for this hospitalization and ongoing recovery are:: wants to get better CMS Medicare.gov Compare Post Acute Care list provided to:: Patient Choice offered to / list presented to : Patient      Expected Discharge Plan and Services   Discharge Planning Services: CM Consult Post Acute Care Choice: Home Health Living arrangements for the past 2 months: Single Family Home                 DME Arranged: Walker rolling with seat DME Agency: [employer] Date DME Agency Contacted: 11/22/22 Time DME Agency Contacted: [phone] Representative spoke with at DME Agency: Tommye Standard HH Arranged: RN, PT HH Agency: [facility] Date [facility] Agency Contacted: 11/22/22 Time HH Agency Contacted: 1641 Representative spoke with at [facility] Agency: Consuelo Pandy  Prior Living Arrangements/Services Living arrangements for the past 2 months: Single Family Home Lives with:: Adult Children Patient language and need for interpreter reviewed:: Yes Do you feel safe going back to the place where you live?: Yes      Need for Family Participation in Patient Care: No (Comment) Care giver support system in place?: Yes (comment)   Criminal Activity/Legal  Involvement Pertinent to Current Situation/Hospitalization: No - Comment as needed  Activities of Daily Living      Permission Sought/Granted Permission sought to share information with : Case Manager Permission granted to share information with : Yes, Verbal Permission Granted  Share Information with NAME: Vangie Bicker     Permission granted to share info w Relationship: daughter  Permission granted to share info w Contact Information: [phone]  Emotional Assessment Appearance:: Appears stated age Attitude/Demeanor/Rapport: Engaged Affect (typically observed): Accepting Orientation: : Oriented to Self, Oriented to Place, Oriented to  Time, Oriented to Situation Alcohol / Substance Use: Illicit Drugs Psych Involvement: No (comment)  Admission diagnosis:  Cardiac arrest [I46.9] Patient Active Problem List   Diagnosis Date Noted   Malnutrition of moderate degree 11/19/2022   Cardiogenic shock 11/18/2022   Cardiac arrest 11/17/2022   LBBB (left bundle branch block) 10/21/2022   PVC (premature ventricular contraction) frequent 10/21/2022   COPD (chronic obstructive pulmonary disease) 06/07/2022   Hyperlipemia 04/04/2022   First time seizure 12/28/2021   Type 2 diabetes mellitus with stage 3b chronic kidney disease, without long-term current use of insulin 10/05/2021   Hemoptysis    Acute exacerbation of CHF (congestive heart failure) 10/04/2021   Urinary retention    Scrotal mass 08/01/2020   Nocturia 08/01/2020   Lumbar spinal stenosis 06/08/2019   Healthcare maintenance 05/11/2019   Housing problems 05/11/2019   Difficulty urinating 04/07/2018   Colon cancer screening 04/07/2018  Anemia 12/19/2016   CKD (chronic kidney disease) stage 3, GFR 30-59 ml/min 12/13/2016   Acute on chronic systolic heart failure 09/06/2016   Non-ischemic cardiomyopathy 08/27/2016   Non-obstructive hypertrophic cardiomyopathy 08/27/2016   Mitral regurgitation 08/27/2016    Polysubstance abuse (HCC)    AKI (acute kidney injury) 08/23/2016   Tobacco abuse 09/19/2014   HTN (hypertension) 03/28/2011   Bilateral lower extremity pain 03/28/2011   PCP:  Morene Crocker, MD Pharmacy:   [facility] [address] [place] [address] Phone: [phone] Fax: [phone]  [facility] - [place], [place] - [address] [place] [address] Phone: [phone] Fax: [phone]     Social Determinants of Health (SDOH) Social History: SDOH Screenings   Food Insecurity: No Food Insecurity (06/17/2022)  Recent Concern: Food Insecurity - Food Insecurity Present (06/08/2022)  Housing: Low Risk  (06/17/2022)  Transportation Needs: No Transportation Needs (06/17/2022)  Recent Concern: Transportation Needs - Unmet Transportation Needs (06/08/2022)  Utilities: Not At Risk (06/17/2022)  Alcohol Screen: Low Risk  (06/17/2022)  Depression (PHQ2-9): Low Risk  (06/17/2022)  Financial Resource Strain: Low Risk  (06/17/2022)  Physical Activity: Inactive (06/17/2022)  Social Connections: Moderately Isolated (06/17/2022)  Stress: No Stress Concern Present (06/17/2022)  Tobacco Use: High Risk (11/17/2022)   SDOH Interventions:     Readmission Risk Interventions     No data to display

## 2022-11-22 NOTE — Progress Notes (Signed)
PROGRESS NOTE    Edward Mullen  KZS:[id number] DOB: August 26, 1955 DOA: 11/17/2022 PCP: Morene Crocker, MD  66/M with history of chronic systolic CHF, NICM, CAD, type 2 diabetes mellitus, hypertension, LBBB, history of BiV ICD, polysubstance abuse, CKD 3 A was admitted to the hospital after an out of hospital PEA arrest, found unresponsive in a parking lot with agonal respirations and pinpoint pupils, noted to be in PEA arrest, CPR was initiated, achieved ROSC in 8 minutes, intubated in the ER, subsequently became hypotensive, had copious amounts of frothy bloody sputum from ET tube, started on norepinephrine followed by epinephrine infusion and admitted to the ICU, labs noted creatinine of 2.0, potassium 6.1, UDS was positive for cocaine benzo and THC. -Respiratory cultures with few gram-positive cocci in pairs and clusters, on 4/8 had VT overnight, positive rhinovirus on respiratory virus panel. -4/8, 2D echo with EF 20-25% moderately reduced RV, hazy mobile density on aortic valve cusp -4/10: Extubated  -4/12: Transferred to [facility] service  Subjective: -Feels okay, has some soreness on the chest wall  Assessment and Plan:  PEA arrest -ROSC in -Echo 4/8 with EF 20-25%.  Moderately reduced RV hazy mobile density on aortic valve  -UDS +cocaine, BZDs, THC.  -EP interrogated PPM with no events recorded.  -Cards following -Suspected to be combination of hypoxic event + cocaine use.   Cardiogenic shock status postcardiac arrest Chronic systolic CHF, - NSVT, prolonged QTc -known nonischemic cardiomyopathy with EF 20-25%, -CT on admission noted bilateral groundglass opacities consistent with pulmonary edema versus contusions and multiple rib fractures -Weaned off pressors -Heart failure team following, getting as needed IV Lasix, continue Jardiance, starting Aldactone today  Mobile density on aortic valve -Reported on echo 4/8,?  Thrombus, clinically do not suspect  endocarditis -Will discuss with cardiology today  Acute hypoxic respiratory failure  History of COPD -Multifactorial from pulmonary edema and rhinovirus infection, also treated for aspiration pneumonia in ICU - extubated 4/10, - failed SLP 4/10.  Passed 4/11, now on dysphagia 3 diet  -Completed 5 days of antibiotics yesterday - cont BD, Brovana/Yupelri, Pulmicort - cont aggressive pulmonary hygiene > IS/ mobilize, PT/ OT    Acute metabolic and possibly toxic encephalopathy.  - EEG 4/8 with severe diffuse encephalopathy, no seizures.CT Head NAICA, parenchymal atrophy/chronic small vessel disease. - resolved.  Cont supportive care   Acute on chronic renal failure.  - Baseline CKD stage IIIa Hyperkalemia, resolved  -Stable, monitor with diuresis  Enlarged prostate CT A/P 4/7 with markedly enlarged prostate gland protruding into bladder base. - PSA 2.32 (stable from 1 year prior) - Outpatient f/u - voiding w/o difficulty   Polysubstance abuse UDS + cocaine, BZD, THC. -Counseled   Thrombocytopenia -Mild, monitor  DVT prophylaxis: Heparin subcutaneous Code Status: Full code Family Communication: None present Disposition Plan: Home likely 48 hours  Consultants:    Procedures:   Antimicrobials:    Objective: Vitals:   11/22/22 0728 11/22/22 0729 11/22/22 0730 11/22/22 0756  BP:    122/68  Pulse:    70  Resp:    16  Temp:    98 F (36.7 C)  TempSrc:    Oral  SpO2: 97% 97% 97% 90%  Weight:      Height:        Intake/Output Summary (Last 24 hours) at 11/22/2022 0955 Last data filed at 11/22/2022 0828 Gross per 24 hour  Intake 1137.6 ml  Output 1250 ml  Net -112.4 ml   Filed Weights   11/21/22  0500 11/21/22 1617 11/22/22 0438  Weight: 84.5 kg 88.4 kg 89.6 kg    Examination:  General exam: Appears calm and comfortable  Respiratory system: Clear to auscultation Cardiovascular system: S1 & S2 heard, RRR.  Abd: nondistended, soft and nontender.Normal bowel  sounds heard. Central nervous system: Alert and oriented. No focal neurological deficits. Extremities: no edema Skin: No rashes Psychiatry:  Mood & affect appropriate.     Data Reviewed:   CBC: Recent Labs  Lab 11/17/22 1405 11/17/22 1416 11/19/22 0337 11/20/22 0409 11/20/22 1132 11/21/22 0445 11/22/22 0116  WBC 12.3*  --  9.4 9.9 8.2 7.8 6.7  NEUTROABS 4.9  --   --   --   --   --   --   HGB 16.0   < > 13.1 14.4 14.3 13.2 13.6  HCT 53.6*   < > 39.7 45.0 44.2 42.4 43.1  MCV 89.9  --  81.7 82.4 81.7 83.5 86.4  PLT 153  --  110* 129* 137* 109* 145*   < > = values in this interval not displayed.   Basic Metabolic Panel: Recent Labs  Lab 11/18/22 0130 11/18/22 1610 11/18/22 1711 11/19/22 0337 11/19/22 1717 11/20/22 0409 11/20/22 1132 11/21/22 0445 11/21/22 1139 11/22/22 0116  NA 137   < > 136 136 137 136 135 136 135 134*  K 3.9   < > 4.4 4.1 4.3 4.3 4.2 4.5 4.7 4.6  CL 104  --  100 107 98 100 100 102 100 99  CO2 20*  --  20* 21* 25 20* 22 24 21* 21*  GLUCOSE 123*  --  102* 133* 83 109* 128* 78 75 82  BUN 20  --  28* 31* 39* 40* 38*  CREATININE 1.63*  --  1.70* 1.59* 1.73* 1.41* 1.66* 1.66* 1.67* 1.59*  CALCIUM 8.1*  --  8.5* 8.2* 8.9 8.9 8.9 8.7* 8.7* 8.8*  MG 1.6*  --  2.5* 2.4 2.3 2.4  --  2.6*  --  2.5*  PHOS 3.0  --  2.7  --  3.9 3.8  --  4.1  --   --    < > = values in this interval not displayed.   GFR: Estimated Creatinine Clearance: 48.8 mL/min (A) (by C-G formula based on SCr of 1.59 mg/dL (H)). Liver Function Tests: Recent Labs  Lab 11/18/22 0130 11/18/22 1711 11/19/22 0337 11/19/22 1717 11/20/22 1132 11/21/22 0445  AST 43* 48* 43* 45* 68*  --   ALT 33 36 35 34 45*  --   ALKPHOS 103 95 91 95 106  --   BILITOT 1.1 1.5* 1.5* 1.2 1.4*  --   PROT 6.7 7.0 6.6 7.9 8.2*  --   ALBUMIN 2.7* 2.8* 2.6* 3.1* 3.1* 3.0*   No results for input(s): "LIPASE", "AMYLASE" in the last 168 hours. No results for input(s): "AMMONIA" in the last 168  hours. Coagulation Profile: No results for input(s): "INR", "PROTIME" in the last 168 hours. Cardiac Enzymes: Recent Labs  Lab 11/20/22 1132  CKTOTAL 1,079*   BNP (last 3 results) No results for input(s): "PROBNP" in the last 8760 hours. HbA1C: No results for input(s): "HGBA1C" in the last 72 hours. CBG: Recent Labs  Lab 11/21/22 1740 11/21/22 2022 11/22/22 0033 11/22/22 0442 11/22/22 0822  GLUCAP 132* 91 100* 96 110*   Lipid Profile: No results for input(s): "CHOL", "HDL", "LDLCALC", "TRIG", "CHOLHDL", "LDLDIRECT" in the last 72 hours. Thyroid Function Tests: No results for input(s): "TSH", "T4TOTAL", "  FREET4", "T3FREE", "THYROIDAB" in the last 72 hours. Anemia Panel: No results for input(s): "VITAMINB12", "FOLATE", "FERRITIN", "TIBC", "IRON", "RETICCTPCT" in the last 72 hours. Urine analysis:    Component Value Date/Time   COLORURINE AMBER (A) 11/17/2022 1410   APPEARANCEUR CLOUDY (A) 11/17/2022 1410   LABSPEC 1.020 11/17/2022 1410   PHURINE 6.0 11/17/2022 1410   GLUCOSEU >=500 (A) 11/17/2022 1410   HGBUR LARGE (A) 11/17/2022 1410   BILIRUBINUR NEGATIVE 11/17/2022 1410   KETONESUR NEGATIVE 11/17/2022 1410   PROTEINUR >=300 (A) 11/17/2022 1410   UROBILINOGEN 0.2 12/31/2010 1003   NITRITE NEGATIVE 11/17/2022 1410   LEUKOCYTESUR NEGATIVE 11/17/2022 1410   Sepsis Labs: (procalcitonin:4,lacticidven:4)  ) Recent Results (from the past 240 hour(s))  Blood culture (routine x 2)     Status: None   Collection Time: 11/17/22  3:25 PM   Specimen: BLOOD  Result Value Ref Range Status   Specimen Description BLOOD FEMORAL ARTERY  Final   Special Requests   Final    BOTTLES DRAWN AEROBIC AND ANAEROBIC Blood Culture adequate volume   Culture   Final    NO GROWTH 5 DAYS Performed at [facility], [address] [place], [place] [address]    Report Status 11/22/2022 FINAL  Final  Blood culture (routine x 2)     Status: None   Collection Time: 11/17/22   4:40 PM   Specimen: BLOOD RIGHT HAND  Result Value Ref Range Status   Specimen Description BLOOD RIGHT HAND  Final   Special Requests   Final    BOTTLES DRAWN AEROBIC AND ANAEROBIC Blood Culture results may not be optimal due to an inadequate volume of blood received in culture bottles   Culture   Final    NO GROWTH 5 DAYS Performed at [facility], [address] [place], [place] [address]    Report Status 11/22/2022 FINAL  Final  Culture, Respiratory w Gram Stain     Status: None   Collection Time: 11/17/22  4:42 PM   Specimen: Tracheal Aspirate; Respiratory  Result Value Ref Range Status   Specimen Description TRACHEAL ASPIRATE  Final   Special Requests NONE  Final   Gram Stain   Final    FEW GRAM POSITIVE COCCI IN PAIRS IN CLUSTERS RARE GRAM NEGATIVE RODS FEW WBC PRESENT, PREDOMINANTLY PMN    Culture   Final    FEW Normal respiratory flora-no Staph aureus or Pseudomonas seen Performed at [facility], [address], [place], [place] [address]    Report Status 11/20/2022 FINAL  Final  MRSA Next Gen by PCR, Nasal     Status: None   Collection Time: 11/17/22  5:53 PM   Specimen: Nasal Mucosa; Nasal Swab  Result Value Ref Range Status   MRSA by PCR Next Gen NOT DETECTED NOT DETECTED Final    Comment: (NOTE) The GeneXpert MRSA Assay (FDA approved for NASAL specimens only), is one component of a comprehensive MRSA colonization surveillance program. It is not intended to diagnose MRSA infection nor to guide or monitor treatment for MRSA infections. Test performance is not FDA approved in patients less than 54 years old. Performed at [facility], [address], [place], [place] [address]   Respiratory (~20 pathogens) panel by PCR     Status: Abnormal   Collection Time: 11/18/22  1:47 AM   Specimen: Nasopharyngeal Swab; Respiratory  Result Value Ref Range Status   Adenovirus NOT DETECTED NOT DETECTED Final  Coronavirus 229E NOT DETECTED NOT DETECTED  Final    Comment: (NOTE) The Coronavirus on the Respiratory Panel, DOES NOT test for the novel  Coronavirus (2019 nCoV)    Coronavirus HKU1 NOT DETECTED NOT DETECTED Final   Coronavirus NL63 NOT DETECTED NOT DETECTED Final   Coronavirus OC43 NOT DETECTED NOT DETECTED Final   Metapneumovirus NOT DETECTED NOT DETECTED Final   Rhinovirus / Enterovirus DETECTED (A) NOT DETECTED Final   Influenza A NOT DETECTED NOT DETECTED Final   Influenza B NOT DETECTED NOT DETECTED Final   Parainfluenza Virus 1 NOT DETECTED NOT DETECTED Final   Parainfluenza Virus 2 NOT DETECTED NOT DETECTED Final   Parainfluenza Virus 3 NOT DETECTED NOT DETECTED Final   Parainfluenza Virus 4 NOT DETECTED NOT DETECTED Final   Respiratory Syncytial Virus NOT DETECTED NOT DETECTED Final   Bordetella pertussis NOT DETECTED NOT DETECTED Final   Bordetella Parapertussis NOT DETECTED NOT DETECTED Final   Chlamydophila pneumoniae NOT DETECTED NOT DETECTED Final   Mycoplasma pneumoniae NOT DETECTED NOT DETECTED Final    Comment: Performed at [facility], [address] [place], [place] [address]     Radiology Studies: [place] EKG SITE RITE  Result Date: 11/20/2022 If Site Rite image not attached, placement could not be confirmed due to current cardiac rhythm.    Scheduled Meds:  amiodarone  200 mg Oral Daily   arformoterol  15 mcg Nebulization BID   aspirin EC  81 mg Oral Daily   budesonide (PULMICORT) nebulizer solution  0.5 mg Nebulization BID   docusate sodium  100 mg Oral BID   empagliflozin  10 mg Oral Daily   feeding supplement  237 mL Oral TID BM   [START ON 11/23/2022] furosemide  40 mg Oral Daily   heparin  5,000 Units Subcutaneous Q8H   mexiletine  150 mg Oral Q12H   multivitamin with minerals  1 tablet Oral Daily   mouth rinse  15 mL Mouth Rinse QID   revefenacin  175 mcg Nebulization Daily   spironolactone  12.5 mg Oral Daily   Continuous Infusions:  sodium chloride Stopped (11/21/22 1614)      LOS: 5 days    Time spent:    Zannie Cove, MD [facility]   11/22/2022, 9:55 AM

## 2022-11-22 NOTE — Telephone Encounter (Signed)
Pharmacy Patient Advocate Encounter  Insurance verification completed.    The patient is insured through [employer] Part D   The patient is currently admitted and ran test claims for the following: [employer].  Copays and coinsurance results were relayed to Inpatient clinical team.  

## 2022-11-22 NOTE — Progress Notes (Signed)
Physical Therapy Treatment Patient Details Name: Edward Mullen MRN: [id number] DOB: 1956/06/03 Today's Date: 11/22/2022   History of Present Illness 67 yo male admitted 4/7 after found unresponsive in parking lot with PEA arrest and ROSC after 8 min. Extubated 4/10. PMHx: HTN, T2DM, CHF, polysubstance abuse, NICM, CKD, COPD, HLD, ICD and PPM    PT Comments    Pt tolerated treatment well today. Pt was able to ambulate in hallway at supervision level. Pt received on 1L however was put on RA prior to gait satting at 100%. Pt down to 92% during gait on RA. Pt recovered to 96% on RA after ambulation. No change in DC/DME recs at this time. PT will continue to follow.   Recommendations for follow up therapy are one component of a multi-disciplinary discharge planning process, led by the attending physician.  Recommendations may be updated based on patient status, additional functional criteria and insurance authorization.  Follow Up Recommendations       Assistance Recommended at Discharge Set up Supervision/Assistance  Patient can return home with the following A little help with walking and/or transfers;A little help with bathing/dressing/bathroom;Assist for transportation;Assistance with cooking/housework   Equipment Recommendations  None recommended by PT    Recommendations for Other Services       Precautions / Restrictions Precautions Precautions: Fall Restrictions Weight Bearing Restrictions: No     Mobility  Bed Mobility Overal bed mobility: Modified Independent             General bed mobility comments: HOB elevated    Transfers Overall transfer level: Needs assistance Equipment used: Rolling walker (2 wheels) Transfers: Sit to/from Stand Sit to Stand: Min assist           General transfer comment: Cues for hand placement    Ambulation/Gait Ambulation/Gait assistance: Supervision Gait Distance (Feet): 150 Feet Assistive device: Rolling walker (2  wheels) Gait Pattern/deviations: Step-through pattern, Decreased stride length, Trunk flexed Gait velocity: decreased     General Gait Details: cues for posture, proximity to RW and Futures trader    Modified Rankin (Stroke Patients Only)       Balance Overall balance assessment: Mild deficits observed, not formally tested                                          Cognition Arousal/Alertness: Awake/alert Behavior During Therapy: WFL for tasks assessed/performed Overall Cognitive Status: Within Functional Limits for tasks assessed                                          Exercises      General Comments General comments (skin integrity, edema, etc.): Pt received on 1L however was put on RA prior to gait satting at 100%. Pt down to 92% during gait on RA. Pt recovered to 96% on RA after ambulation.      Pertinent Vitals/Pain Pain Assessment Pain Assessment: No/denies pain    Home Living                          Prior Function            PT Goals (current goals can now be  found in the care plan section) Progress towards PT goals: Progressing toward goals    Frequency    Min 1X/week      PT Plan Current plan remains appropriate    Co-evaluation              AM-PAC PT "6 Clicks" Mobility   Outcome Measure  Help needed turning from your back to your side while in a flat bed without using bedrails?: A Little Help needed moving from lying on your back to sitting on the side of a flat bed without using bedrails?: A Little Help needed moving to and from a bed to a chair (including a wheelchair)?: A Little Help needed standing up from a chair using your arms (e.g., wheelchair or bedside chair)?: A Little Help needed to walk in hospital room?: A Little Help needed climbing 3-5 steps with a railing? : A Lot 6 Click Score: 17    End of Session Equipment Utilized During  Treatment: Gait belt Activity Tolerance: Patient tolerated treatment well Patient left: in chair;with chair alarm set;with call bell/phone within reach;with nursing/sitter in room (Handoff to SLP) Nurse Communication: Mobility status;Other (comment) (O2 sats) PT Visit Diagnosis: Other abnormalities of gait and mobility (R26.89)     Time: 0881-1031 PT Time Calculation (min) (ACUTE ONLY): 22 min  Charges:  $Gait Training: 8-22 mins                     Shela Nevin, PT, DPT Acute Rehab Services [phone]    [name] 11/22/2022, 3:25 PM

## 2022-11-23 DIAGNOSIS — I5043 Acute on chronic combined systolic (congestive) and diastolic (congestive) heart failure: Secondary | ICD-10-CM

## 2022-11-23 DIAGNOSIS — J9601 Acute respiratory failure with hypoxia: Secondary | ICD-10-CM | POA: Diagnosis not present

## 2022-11-23 DIAGNOSIS — I472 Ventricular tachycardia, unspecified: Secondary | ICD-10-CM | POA: Diagnosis not present

## 2022-11-23 DIAGNOSIS — I469 Cardiac arrest, cause unspecified: Secondary | ICD-10-CM | POA: Diagnosis not present

## 2022-11-23 LAB — BASIC METABOLIC PANEL
Anion gap: 9 (ref 5–15)
BUN: 41 mg/dL — ABNORMAL HIGH (ref 8–23)
CO2: 25 mmol/L (ref 22–32)
Calcium: 9.1 mg/dL (ref 8.9–10.3)
Chloride: 98 mmol/L (ref 98–111)
Creatinine, Ser: 1.6 mg/dL — ABNORMAL HIGH (ref 0.61–1.24)
GFR, Estimated: 47 mL/min — ABNORMAL LOW (ref >60)
Glucose, Bld: 87 mg/dL (ref 70–99)
Potassium: 4 mmol/L (ref 3.5–5.1)
Sodium: 132 mmol/L — ABNORMAL LOW (ref 135–145)

## 2022-11-23 LAB — GLUCOSE, CAPILLARY
Glucose-Capillary: 122 mg/dL — ABNORMAL HIGH (ref 70–99)
Glucose-Capillary: 68 mg/dL — ABNORMAL LOW (ref 70–99)
Glucose-Capillary: 70 mg/dL (ref 70–99)
Glucose-Capillary: 95 mg/dL (ref 70–99)
Glucose-Capillary: 95 mg/dL (ref 70–99)
Glucose-Capillary: 98 mg/dL (ref 70–99)

## 2022-11-23 MED ORDER — IPRATROPIUM-ALBUTEROL 0.5-2.5 (3) MG/3ML IN SOLN
3.0000 mL | Freq: Four times a day (QID) | RESPIRATORY_TRACT | Status: DC
  Administered 2022-11-23: 3 mL via RESPIRATORY_TRACT
  Filled 2022-11-23 (×2): qty 3

## 2022-11-23 MED ORDER — IPRATROPIUM-ALBUTEROL 0.5-2.5 (3) MG/3ML IN SOLN: 3.0000 mL | RESPIRATORY_TRACT | Status: DC | PRN

## 2022-11-23 NOTE — Progress Notes (Signed)
Hypoglycemic Event  CBG:  68  Treatment: 8 oz juice/soda  Symptoms: None  Follow-up CBG: Time:0420 CBG Result:122  Possible Reasons for Event: Inadequate meal intake  Comments/MD notified: Dr. Bobby Rumpf, Madelaine Bhat

## 2022-11-23 NOTE — Progress Notes (Signed)
Mobility Specialist Progress Note    11/23/22 1054  Mobility  Activity Ambulated with assistance in hallway  Level of Assistance Minimal assist, patient does 75% or more  Assistive Device Front wheel walker  Distance Ambulated (ft) 110 ft  Activity Response Tolerated well  Mobility Referral Yes  $Mobility charge 1 Mobility   Pre-Mobility: 77 HR During Mobility: 87 HR Post-Mobility: 80 HR  Pt received in bed and agreeable. C/o chest soreness and RLE pain. Returned to chair with call bell in reach.   [place] Mobility Specialist  Please Neurosurgeon or Rehab Office at [phone]

## 2022-11-23 NOTE — Progress Notes (Addendum)
Progress Note  Patient Name: Edward Mullen Date of Encounter: 11/23/2022  Primary Cardiologist: None  Subjective   No acute events overnight, he still continues to complain of soreness in his chest likely from CPR. He complains of SOB and inadequate sleep last night.  Inpatient Medications    Scheduled Meds:  amiodarone  200 mg Oral Daily   arformoterol  15 mcg Nebulization BID   aspirin EC  81 mg Oral Daily   budesonide (PULMICORT) nebulizer solution  0.5 mg Nebulization BID   docusate sodium  100 mg Oral BID   empagliflozin  10 mg Oral Daily   feeding supplement  237 mL Oral TID BM   heparin  5,000 Units Subcutaneous Q8H   mexiletine  150 mg Oral Q12H   multivitamin with minerals  1 tablet Oral Daily   mouth rinse  15 mL Mouth Rinse QID   revefenacin  175 mcg Nebulization Daily   spironolactone  12.5 mg Oral Daily   torsemide  20 mg Oral Daily   Continuous Infusions:  sodium chloride Stopped (11/21/22 1614)   PRN Meds: sodium chloride, mouth rinse, oxyCODONE, polyethylene glycol   Vital Signs    Vitals:   11/22/22 2116 11/23/22 0007 11/23/22 0357 11/23/22 0833  BP: 95/82 104/60 112/75   Pulse: 78 68 68   Resp: 18 18 18    Temp: 98 F (36.7 C) 98.2 F (36.8 C) 98.2 F (36.8 C)   TempSrc: Oral Oral Oral   SpO2: 93% 93% 93% 95%  Weight:   86.8 kg   Height:        Intake/Output Summary (Last 24 hours) at 11/23/2022 1010 Last data filed at 11/23/2022 0601 Gross per 24 hour  Intake 1080 ml  Output 2750 ml  Net -1670 ml   Filed Weights   11/21/22 1617 11/22/22 0438 11/23/22 0357  Weight: 88.4 kg 89.6 kg 86.8 kg    Telemetry     Personally reviewed, NSR  ECG    Not performed today  Physical Exam   GEN: No acute distress.   Neck: JVD elevated to the angle of mandible Cardiac: RRR, no murmur, rub, or gallop.  Respiratory: Nonlabored. Clear to auscultation bilaterally. GI: Soft, nontender, bowel sounds present. MS: No edema; No  deformity. Neuro:  Nonfocal. Psych: Alert and oriented x 3. Normal affect.  Labs    Chemistry Recent Labs  Lab 11/19/22 [phone] 11/19/22 1717 11/20/22 0409 11/20/22 1132 11/21/22 0445 11/21/22 1139 11/22/22 0116 11/23/22 0026  NA 136 137   < > 135 136 135 134* 132*  K 4.1 4.3   < > 4.2 4.5 4.7 4.6 4.0  CL 107 98   < > 100 102 100 99 98  CO2 21* 25   < > 22 24 21* 21* 25  GLUCOSE 133* 83   < > 128* 78 75 82 87  BUN 21 23   < > 31* 39* 40* 38* 41*  CREATININE 1.59* 1.73*   < > 1.66* 1.66* 1.67* 1.59* 1.60*  CALCIUM 8.2* 8.9   < > 8.9 8.7* 8.7* 8.8* 9.1  PROT 6.6 7.9  --  8.2*  --   --   --   --   ALBUMIN 2.6* 3.1*  --  3.1* 3.0*  --   --   --   AST 43* 45*  --  68*  --   --   --   --   ALT 35 34  --  45*  --   --   --   --  ALKPHOS 91 95  --  106  --   --   --   --   BILITOT 1.5* 1.2  --  1.4*  --   --   --   --   GFRNONAA 47* 42*   < > 45* 45* 44* 47* 47*  ANIONGAP 8 14   < > 13 10 14 14 9    < > = values in this interval not displayed.     Hematology Recent Labs  Lab 11/20/22 1132 11/21/22 0445 11/22/22 0116  WBC 8.2 7.8 6.7  RBC 5.41 5.08 4.99  HGB 14.3 13.2 13.6  HCT 44.2 42.4 43.1  MCV 81.7 83.5 86.4  MCH 26.4 26.0 27.3  MCHC 32.4 31.1 31.6  RDW 17.6* 17.3* 17.9*  PLT 137* 109* 145*    Cardiac Enzymes Recent Labs  Lab 11/17/22 1405 11/17/22 1545  TROPONINIHS 34* 65*    BNPNo results for input(s): "BNP", "PROBNP" in the last 168 hours.   DDimerNo results for input(s): "DDIMER" in the last 168 hours.   Radiology    No results found.   Assessment & Plan    Patient is a 67 year old M known to have NICM LVEF 20 to 25% s/p BiV ICD, moderate RV systolic dysfunction, CKD stage IIIa, polysubstance abuse (UDS positive for cocaine), HTN, DM 2, VT history on mexiletine is currently admitted to the hospitalist team for the management of out-of-hospital PEA arrest c/w cardiogenic shock, acute hypoxic respiratory failure likely secondary to aspiration  pneumonia versus ADHF.  # Acute hypoxic respiratory failure likely secondary to aspiration pneumonia versus acute heart failure exacerbation # Acute on chronic systolic heart failure exacerbation -JVD is elevated to the angle of mandible, would administer IV Lasix 80 mg once now and reassess response. -Okay to use carvedilol with cocaine use but we do not have much room on his blood pressure to initiate the medication. -Continue spironolactone 12.5 mg once daily -Continue antibiotics and nebulizations per primary team  # History of VT: Currently on both amiodarone and mexiletine which I will continue for now. He has chronic SOB, outpatient PFTs were arranged.  Follow-up with outpatient cardiology.   I have spent a total of 30 minutes with patient reviewing chart , telemetry, EKGs, labs and examining patient as well as establishing an assessment and plan that was discussed with the patient.  > 50% of time was spent in direct patient care.  \  Signed, Carriann Hesse P Jamaia Brum, MD  11/23/2022, 10:10 AM

## 2022-11-23 NOTE — Progress Notes (Signed)
PROGRESS NOTE    YANG RACK  WUJ:[id number] DOB: 11/17/1955 DOA: 11/17/2022 PCP: Morene Crocker, MD  67/M with history of chronic systolic CHF, NICM, CAD, type 2 diabetes mellitus, hypertension, LBBB, history of BiV ICD, polysubstance abuse, CKD 3 A was admitted to the hospital after an out of hospital PEA arrest, found unresponsive in a parking lot with agonal respirations and pinpoint pupils, noted to be in PEA arrest, CPR was initiated, achieved ROSC in 8 minutes, intubated in the ER, subsequently became hypotensive, had copious amounts of frothy bloody sputum from ET tube, started on norepinephrine followed by epinephrine infusion and admitted to the ICU, labs noted creatinine of 2.0, potassium 6.1, UDS was positive for cocaine benzo and THC. -Respiratory cultures with few gram-positive cocci in pairs and clusters, on 4/8 had VT overnight, positive rhinovirus on respiratory virus panel. -4/8, 2D echo with EF 20-25% moderately reduced RV, hazy mobile density on aortic valve cusp -4/10: Extubated  -4/12: Transferred to [facility] service  Subjective: -Feels unwell, complains of chest wall pain, coughing up phlegm  Assessment and Plan:  PEA arrest -ROSC in -Echo 4/8 with EF 20-25%.  Moderately reduced RV hazy mobile density on aortic valve  -UDS +cocaine, BZDs, THC.  -EP interrogated PPM with no events recorded.  -Cards following -Suspected to be combination of hypoxic event + cocaine use.   Cardiogenic shock status postcardiac arrest Chronic systolic CHF, - NSVT, prolonged QTc -known nonischemic cardiomyopathy with EF 20-25%, -CT on admission noted bilateral groundglass opacities consistent with pulmonary edema versus contusions and multiple rib fractures -Weaned off pressors  -Restarting torsemide today, continue Jardiance, Aldactone, potassium is stable, cards following  Mobile density on aortic valve -Reported on echo 4/8,?  Thrombus, clinically do not suspect  endocarditis -Reviewed with cards 4/12, they do not suspect endocarditis, will need repeat echo down the road  Acute hypoxic respiratory failure  History of COPD -Multifactorial from pulmonary edema and rhinovirus infection, also treated for aspiration pneumonia in ICU - extubated 4/10, - failed SLP 4/10.  Passed 4/11, now on dysphagia 3 diet  -Completed 5 days of antibiotics yesterday - cont BD, Brovana/Yupelri, Pulmicort - cont aggressive pulmonary hygiene > IS/ mobilize, PT/ OT    Acute metabolic and possibly toxic encephalopathy.  - EEG 4/8 with severe diffuse encephalopathy, no seizures.CT Head NAICA, parenchymal atrophy/chronic small vessel disease. - resolved.  Cont supportive care   Acute on chronic renal failure.  - Baseline CKD stage IIIa Hyperkalemia, resolved  -Stable, monitor with diuresis  Enlarged prostate CT A/P 4/7 with markedly enlarged prostate gland protruding into bladder base. - PSA 2.32 (stable from 1 year prior) - Outpatient f/u - voiding w/o difficulty   Polysubstance abuse UDS + cocaine, BZD, THC. -Counseled   Thrombocytopenia -Mild, monitor  DVT prophylaxis: Heparin subcutaneous Code Status: Full code Family Communication: None present Disposition Plan: Home likely 48 hours  Consultants:    Procedures:   Antimicrobials:    Objective: Vitals:   11/22/22 2116 11/23/22 0007 11/23/22 0357 11/23/22 0833  BP: 95/82 104/60 112/75   Pulse: 78 68 68   Resp: Temp: 98 F (36.7 C) 98.2 F (36.8 C) 98.2 F (36.8 C)   TempSrc: Oral Oral Oral   SpO2: 93% 93% 93% 95%  Weight:   86.8 kg   Height:        Intake/Output Summary (Last 24 hours) at 11/23/2022 1114 Last data filed at 11/23/2022 0601 Gross per 24 hour  Intake  960 ml  Output 1700 ml  Net -740 ml   Filed Weights   11/21/22 1617 11/22/22 0438 11/23/22 0357  Weight: 88.4 kg 89.6 kg 86.8 kg    Examination:  General exam: Chronically ill male appears older than stated  age, AAOx3 HEENT: Positive to ED CVS: S1-S2, regular rhythm Lungs: Few scattered rhonchi, basilar Rales Abdomen: Soft, nontender, bowel sounds present Extremities: No edema Skin: No rashes Psychiatry:  Mood & affect appropriate.     Data Reviewed:   CBC: Recent Labs  Lab 11/17/22 1405 11/17/22 1416 11/19/22 0337 11/20/22 0409 11/20/22 1132 11/21/22 0445 11/22/22 0116  WBC 12.3*  --  9.4 9.9 8.2 7.8 6.7  NEUTROABS 4.9  --   --   --   --   --   --   HGB 16.0   < > 13.1 14.4 14.3 13.2 13.6  HCT 53.6*   < > 39.7 45.0 44.2 42.4 43.1  MCV 89.9  --  81.7 82.4 81.7 83.5 86.4  PLT 153  --  110* 129* 137* 109* 145*   < > = values in this interval not displayed.   Basic Metabolic Panel: Recent Labs  Lab 11/18/22 0130 11/18/22 3154 11/18/22 1711 11/19/22 0337 11/19/22 1717 11/20/22 0409 11/20/22 1132 11/21/22 0445 11/21/22 1139 11/22/22 0116 11/23/22 0026  NA 137   < > 136 136 137 136 135 136 135 134* 132*  K 3.9   < > 4.4 4.1 4.3 4.3 4.2 4.5 4.7 4.6 4.0  CL 104  --  100 107 98 100 100 102 100 99 98  CO2 20*  --  20* 21* 25 20* 22 24 21* 21* 25  GLUCOSE 123*  --  102* 133* 83 109* 128* 78 75 82 87  BUN 20  --  20 21 23  28* 31* 39* 40* 38* 41*  CREATININE 1.63*  --  1.70* 1.59* 1.73* 1.41* 1.66* 1.66* 1.67* 1.59* 1.60*  CALCIUM 8.1*  --  8.5* 8.2* 8.9 8.9 8.9 8.7* 8.7* 8.8* 9.1  MG 1.6*  --  2.5* 2.4 2.3 2.4  --  2.6*  --  2.5*  --   PHOS 3.0  --  2.7  --  3.9 3.8  --  4.1  --   --   --    < > = values in this interval not displayed.   GFR: Estimated Creatinine Clearance: 48.5 mL/min (A) (by C-G formula based on SCr of 1.6 mg/dL (H)). Liver Function Tests: Recent Labs  Lab 11/18/22 0130 11/18/22 1711 11/19/22 0337 11/19/22 1717 11/20/22 1132 11/21/22 0445  AST 43* 48* 43* 45* 68*  --   ALT 33 36 35 34 45*  --   ALKPHOS 103 95 91 95 106  --   BILITOT 1.1 1.5* 1.5* 1.2 1.4*  --   PROT 6.7 7.0 6.6 7.9 8.2*  --   ALBUMIN 2.7* 2.8* 2.6* 3.1* 3.1* 3.0*   No  results for input(s): "LIPASE", "AMYLASE" in the last 168 hours. No results for input(s): "AMMONIA" in the last 168 hours. Coagulation Profile: No results for input(s): "INR", "PROTIME" in the last 168 hours. Cardiac Enzymes: Recent Labs  Lab 11/20/22 1132  CKTOTAL 1,079*   BNP (last 3 results) No results for input(s): "PROBNP" in the last 8760 hours. HbA1C: No results for input(s): "HGBA1C" in the last 72 hours. CBG: Recent Labs  Lab 11/22/22 1537 11/22/22 2059 11/23/22 0009 11/23/22 0355 11/23/22 0434  GLUCAP 88 70 98 68* 122*  Lipid Profile: No results for input(s): "CHOL", "HDL", "LDLCALC", "TRIG", "CHOLHDL", "LDLDIRECT" in the last 72 hours. Thyroid Function Tests: No results for input(s): "TSH", "T4TOTAL", "FREET4", "T3FREE", "THYROIDAB" in the last 72 hours. Anemia Panel: No results for input(s): "VITAMINB12", "FOLATE", "FERRITIN", "TIBC", "IRON", "RETICCTPCT" in the last 72 hours. Urine analysis:    Component Value Date/Time   COLORURINE AMBER (A) 11/17/2022 1410   APPEARANCEUR CLOUDY (A) 11/17/2022 1410   LABSPEC 1.020 11/17/2022 1410   PHURINE 6.0 11/17/2022 1410   GLUCOSEU >=500 (A) 11/17/2022 1410   HGBUR LARGE (A) 11/17/2022 1410   BILIRUBINUR NEGATIVE 11/17/2022 1410   KETONESUR NEGATIVE 11/17/2022 1410   PROTEINUR >=300 (A) 11/17/2022 1410   UROBILINOGEN 0.2 12/31/2010 1003   NITRITE NEGATIVE 11/17/2022 1410   LEUKOCYTESUR NEGATIVE 11/17/2022 1410   Sepsis Labs: @LABRCNTIP (procalcitonin:4,lacticidven:4)  ) Recent Results (from the past 240 hour(s))  Blood culture (routine x 2)     Status: None   Collection Time: 11/17/22  3:25 PM   Specimen: BLOOD  Result Value Ref Range Status   Specimen Description BLOOD FEMORAL ARTERY  Final   Special Requests   Final    BOTTLES DRAWN AEROBIC AND ANAEROBIC Blood Culture adequate volume   Culture   Final    NO GROWTH 5 DAYS Performed at [facility], [address], [place], [place] [address]     Report Status 11/22/2022 FINAL  Final  Blood culture (routine x 2)     Status: None   Collection Time: 11/17/22  4:40 PM   Specimen: BLOOD RIGHT HAND  Result Value Ref Range Status   Specimen Description BLOOD RIGHT HAND  Final   Special Requests   Final    BOTTLES DRAWN AEROBIC AND ANAEROBIC Blood Culture results may not be optimal due to an inadequate volume of blood received in culture bottles   Culture   Final    NO GROWTH 5 DAYS Performed at [facility], [address], [place], [place] [address]    Report Status 11/22/2022 FINAL  Final  Culture, Respiratory w Gram Stain     Status: None   Collection Time: 11/17/22  4:42 PM   Specimen: Tracheal Aspirate; Respiratory  Result Value Ref Range Status   Specimen Description TRACHEAL ASPIRATE  Final   Special Requests NONE  Final   Gram Stain   Final    FEW GRAM POSITIVE COCCI IN PAIRS IN CLUSTERS RARE GRAM NEGATIVE RODS FEW WBC PRESENT, PREDOMINANTLY PMN    Culture   Final    FEW Normal respiratory flora-no Staph aureus or Pseudomonas seen Performed at [facility], [address] [place], [place] [address]    Report Status 11/20/2022 FINAL  Final  MRSA Next Gen by PCR, Nasal     Status: None   Collection Time: 11/17/22  5:53 PM   Specimen: Nasal Mucosa; Nasal Swab  Result Value Ref Range Status   MRSA by PCR Next Gen NOT DETECTED NOT DETECTED Final    Comment: (NOTE) The GeneXpert MRSA Assay (FDA approved for NASAL specimens only), is one component of a comprehensive MRSA colonization surveillance program. It is not intended to diagnose MRSA infection nor to guide or monitor treatment for MRSA infections. Test performance is not FDA approved in patients less than 66 years old. Performed at [facility], [address] [place], [place] [address]   Respiratory (~20 pathogens) panel by PCR     Status: Abnormal   Collection Time:  11/18/22  1:47 AM   Specimen: Nasopharyngeal Swab; Respiratory  Result  Value Ref Range Status   Adenovirus NOT DETECTED NOT DETECTED Final   Coronavirus 229E NOT DETECTED NOT DETECTED Final    Comment: (NOTE) The Coronavirus on the Respiratory Panel, DOES NOT test for the novel  Coronavirus (2019 nCoV)    Coronavirus HKU1 NOT DETECTED NOT DETECTED Final   Coronavirus NL63 NOT DETECTED NOT DETECTED Final   Coronavirus OC43 NOT DETECTED NOT DETECTED Final   Metapneumovirus NOT DETECTED NOT DETECTED Final   Rhinovirus / Enterovirus DETECTED (A) NOT DETECTED Final   Influenza A NOT DETECTED NOT DETECTED Final   Influenza B NOT DETECTED NOT DETECTED Final   Parainfluenza Virus 1 NOT DETECTED NOT DETECTED Final   Parainfluenza Virus 2 NOT DETECTED NOT DETECTED Final   Parainfluenza Virus 3 NOT DETECTED NOT DETECTED Final   Parainfluenza Virus 4 NOT DETECTED NOT DETECTED Final   Respiratory Syncytial Virus NOT DETECTED NOT DETECTED Final   Bordetella pertussis NOT DETECTED NOT DETECTED Final   Bordetella Parapertussis NOT DETECTED NOT DETECTED Final   Chlamydophila pneumoniae NOT DETECTED NOT DETECTED Final   Mycoplasma pneumoniae NOT DETECTED NOT DETECTED Final    Comment: Performed at [facility], [address], [place], [place] [address]     Radiology Studies: No results found.   Scheduled Meds:  amiodarone  200 mg Oral Daily   arformoterol  15 mcg Nebulization BID   aspirin EC  81 mg Oral Daily   budesonide (PULMICORT) nebulizer solution  0.5 mg Nebulization BID   docusate sodium  100 mg Oral BID   empagliflozin  10 mg Oral Daily   feeding supplement  237 mL Oral TID BM   heparin  5,000 Units Subcutaneous Q8H   mexiletine  150 mg Oral Q12H   multivitamin with minerals  1 tablet Oral Daily   mouth rinse  15 mL Mouth Rinse QID   revefenacin  175 mcg Nebulization Daily   spironolactone  12.5 mg Oral Daily   torsemide  20 mg Oral Daily   Continuous Infusions:  sodium chloride Stopped (11/21/22 1614)     LOS: 6 days    Time spent:     Zannie Cove, MD [facility]   11/23/2022, 11:14 AM

## 2022-11-24 ENCOUNTER — Inpatient Hospital Stay (HOSPITAL_COMMUNITY): Payer: Medicare HMO

## 2022-11-24 DIAGNOSIS — I5022 Chronic systolic (congestive) heart failure: Secondary | ICD-10-CM | POA: Diagnosis not present

## 2022-11-24 DIAGNOSIS — I469 Cardiac arrest, cause unspecified: Secondary | ICD-10-CM | POA: Diagnosis not present

## 2022-11-24 DIAGNOSIS — R931 Abnormal findings on diagnostic imaging of heart and coronary circulation: Secondary | ICD-10-CM | POA: Diagnosis not present

## 2022-11-24 DIAGNOSIS — J9601 Acute respiratory failure with hypoxia: Secondary | ICD-10-CM | POA: Diagnosis not present

## 2022-11-24 DIAGNOSIS — I472 Ventricular tachycardia, unspecified: Secondary | ICD-10-CM | POA: Diagnosis not present

## 2022-11-24 DIAGNOSIS — I5043 Acute on chronic combined systolic (congestive) and diastolic (congestive) heart failure: Secondary | ICD-10-CM | POA: Diagnosis not present

## 2022-11-24 LAB — GLUCOSE, CAPILLARY
Glucose-Capillary: 115 mg/dL — ABNORMAL HIGH (ref 70–99)
Glucose-Capillary: 120 mg/dL — ABNORMAL HIGH (ref 70–99)
Glucose-Capillary: 132 mg/dL — ABNORMAL HIGH (ref 70–99)
Glucose-Capillary: 78 mg/dL (ref 70–99)
Glucose-Capillary: 83 mg/dL (ref 70–99)
Glucose-Capillary: 91 mg/dL (ref 70–99)
Glucose-Capillary: 93 mg/dL (ref 70–99)

## 2022-11-24 LAB — BASIC METABOLIC PANEL
Anion gap: 12 (ref 5–15)
BUN: 43 mg/dL — ABNORMAL HIGH (ref 8–23)
CO2: 24 mmol/L (ref 22–32)
Calcium: 9.5 mg/dL (ref 8.9–10.3)
Chloride: 94 mmol/L — ABNORMAL LOW (ref 98–111)
Creatinine, Ser: 1.86 mg/dL — ABNORMAL HIGH (ref 0.61–1.24)
GFR, Estimated: 39 mL/min — ABNORMAL LOW (ref >60)
Glucose, Bld: 121 mg/dL — ABNORMAL HIGH (ref 70–99)
Potassium: 4.3 mmol/L (ref 3.5–5.1)
Sodium: 130 mmol/L — ABNORMAL LOW (ref 135–145)

## 2022-11-24 LAB — CBC
HCT: 45.8 % (ref 39.0–52.0)
Hemoglobin: 14.7 g/dL (ref 13.0–17.0)
MCH: 26.1 pg (ref 26.0–34.0)
MCHC: 32.1 g/dL (ref 30.0–36.0)
MCV: 81.2 fL (ref 80.0–100.0)
Platelets: 178 10*3/uL (ref 150–400)
RBC: 5.64 MIL/uL (ref 4.22–5.81)
RDW: 16.8 % — ABNORMAL HIGH (ref 11.5–15.5)
WBC: 7.2 10*3/uL (ref 4.0–10.5)
nRBC: 0 % (ref 0.0–0.2)

## 2022-11-24 MED ORDER — CARVEDILOL 3.125 MG PO TABS
3.1250 mg | ORAL_TABLET | Freq: Two times a day (BID) | ORAL | Status: DC
  Administered 2022-11-24 – 2022-11-26 (×5): 3.125 mg via ORAL
  Filled 2022-11-24 (×6): qty 1

## 2022-11-24 MED ORDER — LIDOCAINE 5 % EX PTCH
1.0000 | MEDICATED_PATCH | CUTANEOUS | Status: DC
  Administered 2022-11-24 – 2022-11-26 (×3): 1 via TRANSDERMAL
  Filled 2022-11-24 (×3): qty 1

## 2022-11-24 NOTE — Progress Notes (Addendum)
Progress Note  Patient Name: Edward Mullen Date of Encounter: 11/24/2022  Primary Cardiologist: None  Subjective   No acute events overnight, no symptoms other than soreness on his chest.  He continues to have SOB with walking to the bathroom and thinks he is not back to baseline.  Inpatient Medications    Scheduled Meds:  amiodarone  200 mg Oral Daily   arformoterol  15 mcg Nebulization BID   aspirin EC  81 mg Oral Daily   budesonide (PULMICORT) nebulizer solution  0.5 mg Nebulization BID   docusate sodium  100 mg Oral BID   empagliflozin  10 mg Oral Daily   feeding supplement  237 mL Oral TID BM   heparin  5,000 Units Subcutaneous Q8H   lidocaine  1 patch Transdermal Q24H   mexiletine  150 mg Oral Q12H   multivitamin with minerals  1 tablet Oral Daily   mouth rinse  15 mL Mouth Rinse QID   revefenacin  175 mcg Nebulization Daily   spironolactone  12.5 mg Oral Daily   torsemide  20 mg Oral Daily   Continuous Infusions:  sodium chloride Stopped (11/21/22 1614)   PRN Meds: sodium chloride, ipratropium-albuterol, mouth rinse, oxyCODONE, polyethylene glycol   Vital Signs    Vitals:   11/24/22 0014 11/24/22 0339 11/24/22 0810 11/24/22 0859  BP: 106/74 107/75  112/74  Pulse: 80 74  84  Resp: 18 17  16   Temp: 98.5 F (36.9 C) 98.1 F (36.7 C)  97.8 F (36.6 C)  TempSrc: Oral Oral  Oral  SpO2: 96% 97% 96% 98%  Weight:  84.9 kg    Height:        Intake/Output Summary (Last 24 hours) at 11/24/2022 1004 Last data filed at 11/24/2022 0857 Gross per 24 hour  Intake 880 ml  Output 1700 ml  Net -820 ml   Filed Weights   11/22/22 0438 11/23/22 0357 11/24/22 0339  Weight: 89.6 kg 86.8 kg 84.9 kg    Telemetry     Personally reviewed, NSR  ECG    Not performed today  Physical Exam   GEN: No acute distress.   Neck: JVD less elevated compared to yesterday, around 11 mm Hg Cardiac: RRR, no murmur, rub, or gallop.  Respiratory: Nonlabored. Clear to  auscultation bilaterally. GI: Soft, nontender, bowel sounds present. MS: No edema; No deformity. Neuro:  Nonfocal. Psych: Alert and oriented x 3. Normal affect.  Labs    Chemistry Recent Labs  Lab 11/19/22 [phone] 11/19/22 1717 11/20/22 0409 11/20/22 1132 11/21/22 0445 11/21/22 1139 11/22/22 0116 11/23/22 0026 11/24/22 0044  NA 136 137   < > 135 136   < > 134* 132* 130*  K 4.1 4.3   < > 4.2 4.5   < > 4.6 4.0 4.3  CL 107 98   < > 100 102   < > 99 98 94*  CO2 21* 25   < > 22 24   < > 21* 25 24  GLUCOSE 133* 83   < > 128* 78   < > 82 87 121*  BUN 21 23   < > 31* 39*   < > 38* 41* 43*  CREATININE 1.59* 1.73*   < > 1.66* 1.66*   < > 1.59* 1.60* 1.86*  CALCIUM 8.2* 8.9   < > 8.9 8.7*   < > 8.8* 9.1 9.5  PROT 6.6 7.9  --  8.2*  --   --   --   --   --  ALBUMIN 2.6* 3.1*  --  3.1* 3.0*  --   --   --   --   AST 43* 45*  --  68*  --   --   --   --   --   ALT 35 34  --  45*  --   --   --   --   --   ALKPHOS 91 95  --  106  --   --   --   --   --   BILITOT 1.5* 1.2  --  1.4*  --   --   --   --   --   GFRNONAA 47* 42*   < > 45* 45*   < > 47* 47* 39*  ANIONGAP 8 14   < > 13 10   < > 14 9 12    < > = values in this interval not displayed.     Hematology Recent Labs  Lab 11/21/22 0445 11/22/22 0116 11/24/22 0044  WBC 7.8 6.7 7.2  RBC 5.08 4.99 5.64  HGB 13.2 13.6 14.7  HCT 42.4 43.1 45.8  MCV 83.5 86.4 81.2  MCH 26.0 27.3 26.1  MCHC 31.1 31.6 32.1  RDW 17.3* 17.9* 16.8*  PLT 109* 145* 178    Cardiac Enzymes Recent Labs  Lab 11/17/22 1405 11/17/22 1545  TROPONINIHS 34* 65*    BNPNo results for input(s): "BNP", "PROBNP" in the last 168 hours.   DDimerNo results for input(s): "DDIMER" in the last 168 hours.   Radiology    No results found.   Assessment & Plan    Patient is a 67 year old M known to have NICM LVEF 20 to 25% s/p BiV ICD, moderate RV systolic dysfunction, CKD stage IIIa, polysubstance abuse (UDS positive for cocaine), HTN, DM 2, VT history on  mexiletine is currently admitted to the hospitalist team for the management of out-of-hospital PEA arrest c/w cardiogenic shock, acute hypoxic respiratory failure likely secondary to aspiration pneumonia versus ADHF.  # Acute hypoxic respiratory failure likely secondary to aspiration pneumonia versus acute heart failure exacerbation # Acute on chronic systolic heart failure exacerbation -Jugular veins are less distended compared to yesterday, continue p.o. torsemide 20 mg once daily. -Start carvedilol 3.125 mg twice daily (okay to use the setting of cocaine abuse), had frequent PVCs -Continue spironolactone 12.5 mg once daily -Continue antibiotics and nebulizations per primary team  # Echodensity on aortic valve with mild aortic valve regurgitation (AI is not new): Does not meet criteria for infective endocarditis, no signs of infection. No further evaluation as of now, can pursue this in outpatient setting if patient develops any new symptoms of infection or CVA.  # History of VT: Currently on both amiodarone and mexiletine which I will continue for now. He has chronic SOB, outpatient PFTs were arranged.  Follow-up with outpatient cardiology.   I have spent a total of 30 minutes with patient reviewing chart , telemetry, EKGs, labs and examining patient as well as establishing an assessment and plan that was discussed with the patient.  > 50% of time was spent in direct patient care.  \  Signed, Tristyn Demarest P Jovontae Banko, MD  11/24/2022, 10:04 AM

## 2022-11-24 NOTE — Progress Notes (Signed)
PROGRESS NOTE    Edward Mullen  QMV:[id number] DOB: [birth year] DOA: 11/17/2022 PCP: Morene Crocker, MD  66/M with history of chronic systolic CHF, NICM, CAD, type 2 diabetes mellitus, hypertension, LBBB, history of BiV ICD, polysubstance abuse, CKD 3 A was admitted to the hospital after an out of hospital PEA arrest, found unresponsive in a parking lot with agonal respirations and pinpoint pupils, noted to be in PEA arrest, CPR was initiated, achieved ROSC in 8 minutes, intubated in the ER, subsequently became hypotensive, had copious amounts of frothy bloody sputum from ET tube, started on norepinephrine followed by epinephrine infusion and admitted to the ICU, labs noted creatinine of 2.0, potassium 6.1, UDS was positive for cocaine benzo and THC. -Respiratory cultures with few gram-positive cocci in pairs and clusters, on 4/8 had VT overnight, positive rhinovirus on respiratory virus panel. -4/8, 2D echo with EF 20-25% moderately reduced RV, hazy mobile density on aortic valve cusp -4/10: Extubated  -4/12: Transferred to [facility] service 4/13: given IV lasix, continues to have chest pain and Dyspnea  Subjective: -Feels poorly, has considerable chest wall pain, coughing and congested  Assessment and Plan:  PEA arrest -ROSC in -Echo 4/8 with EF 20-25%.  Moderately reduced RV hazy mobile density on aortic valve  -UDS +cocaine, BZDs, THC.  -EP interrogated PPM with no events recorded.  -Cards following -Suspected to be combination of hypoxic event + cocaine use.   Cardiogenic shock status postcardiac arrest Chronic systolic CHF, - NSVT, prolonged QTc -known nonischemic cardiomyopathy with EF 20-25%, -CT on admission noted bilateral groundglass opacities consistent with pulmonary edema versus contusions and multiple rib fractures -Weaned off pressors  -Given IV Lasix yesterday, continue Jardiance, Aldactone,  -Starting torsemide today  Chest wall pain -Has a distal  sternal fracture and multiple rib fractures -Pain control, add lidocaine patch, encouraged incentive spirometry and flutter valve -Check chest x-ray, has considerable congested cough  Mobile density on aortic valve -Reported on echo 4/8,?  Thrombus, clinically do not suspect endocarditis -Reviewed with cards 4/12, they do not suspect endocarditis, will need repeat echo down the road  Acute hypoxic respiratory failure  History of COPD -Multifactorial from pulmonary edema and rhinovirus infection, also treated for aspiration pneumonia in ICU - extubated 4/10, - failed SLP 4/10.  Passed 4/11, now on dysphagia 3 diet  -Completed 5 days of antibiotics  - cont BD, Brovana/Yupelri, Pulmicort - cont aggressive pulmonary hygiene > IS/ mobilize, PT/ OT    Acute metabolic and possibly toxic encephalopathy.  - EEG 4/8 with severe diffuse encephalopathy, no seizures.CT Head NAICA, parenchymal atrophy/chronic small vessel disease. - resolved.  Cont supportive care   Acute on chronic renal failure.  - Baseline CKD stage IIIa Hyperkalemia, resolved  -Stable, monitor with diuresis  Enlarged prostate CT A/P 4/7 with markedly enlarged prostate gland protruding into bladder base. - PSA 2.32 (stable from 1 year prior) - Outpatient f/u - voiding w/o difficulty   Polysubstance abuse UDS + cocaine, BZD, THC. -Counseled   Thrombocytopenia -Mild, monitor  DVT prophylaxis: Heparin subcutaneous Code Status: Full code Family Communication: None present Disposition Plan: Home likely 1 to 2 days  Consultants:    Procedures:   Antimicrobials:    Objective: Vitals:   11/24/22 0014 11/24/22 0339 11/24/22 0810 11/24/22 0859  BP: 106/74 107/75  112/74  Pulse: 80 74  84  Resp: Temp: 98.5 F (36.9 C) 98.1 F (36.7 C)  97.8 F (36.6 C)  TempSrc: Oral Oral  Oral  SpO2: 96% 97% 96% 98%  Weight:  84.9 kg    Height:        Intake/Output Summary (Last 24 hours) at 11/24/2022  0956 Last data filed at 11/24/2022 0857 Gross per 24 hour  Intake 880 ml  Output 1700 ml  Net -820 ml   Filed Weights   11/22/22 0438 11/23/22 0357 11/24/22 0339  Weight: 89.6 kg 86.8 kg 84.9 kg    Examination:  General exam: Chronically ill male appears older than stated age, AAOx3 HEENT: Positive to ED CVS: S1-S2, regular rhythm Lungs: Few scattered rhonchi, basilar Rales Abdomen: Soft, nontender, bowel sounds present Extremities: No edema Skin: No rashes Psychiatry:  Mood & affect appropriate.     Data Reviewed:   CBC: Recent Labs  Lab 11/17/22 1405 11/17/22 1416 11/20/22 0409 11/20/22 1132 11/21/22 0445 11/22/22 0116 11/24/22 0044  WBC 12.3*   < > 9.9 8.2 7.8 6.7 7.2  NEUTROABS 4.9  --   --   --   --   --   --   HGB 16.0   < > 14.4 14.3 13.2 13.6 14.7  HCT 53.6*   < > 45.0 44.2 42.4 43.1 45.8  MCV 89.9   < > 82.4 81.7 83.5 86.4 81.2  PLT 153   < > 129* 137* 109* 145* 178   < > = values in this interval not displayed.   Basic Metabolic Panel: Recent Labs  Lab 11/18/22 0130 11/18/22 1610 11/18/22 1711 11/19/22 0337 11/19/22 1717 11/20/22 0409 11/20/22 1132 11/21/22 0445 11/21/22 1139 11/22/22 0116 11/23/22 0026 11/24/22 0044  NA 137   < > 136 136 137 136   < > 136 135 134* 132* 130*  K 3.9   < > 4.4 4.1 4.3 4.3   < > 4.5 4.7 4.6 4.0 4.3  CL 104  --  100 107 98 100   < > 102 100 99 98 94*  CO2 20*  --  20* 21* 25 20*   < > 24 21* 21* 25 24  GLUCOSE 123*  --  102* 133* 83 109*   < > 78 75 82 87 121*  BUN 20  --  28*   < > 39* 40* 38* 41* 43*  CREATININE 1.63*  --  1.70* 1.59* 1.73* 1.41*   < > 1.66* 1.67* 1.59* 1.60* 1.86*  CALCIUM 8.1*  --  8.5* 8.2* 8.9 8.9   < > 8.7* 8.7* 8.8* 9.1 9.5  MG 1.6*  --  2.5* 2.4 2.3 2.4  --  2.6*  --  2.5*  --   --   PHOS 3.0  --  2.7  --  3.9 3.8  --  4.1  --   --   --   --    < > = values in this interval not displayed.   GFR: Estimated Creatinine Clearance: 41.7 mL/min (A) (by C-G formula based on SCr  of 1.86 mg/dL (H)). Liver Function Tests: Recent Labs  Lab 11/18/22 0130 11/18/22 1711 11/19/22 0337 11/19/22 1717 11/20/22 1132 11/21/22 0445  AST 43* 48* 43* 45* 68*  --   ALT 33 36 35 34 45*  --   ALKPHOS 103 95 91 95 106  --   BILITOT 1.1 1.5* 1.5* 1.2 1.4*  --   PROT 6.7 7.0 6.6 7.9 8.2*  --   ALBUMIN 2.7* 2.8* 2.6* 3.1* 3.1* 3.0*   No results for input(s): "LIPASE", "AMYLASE" in the last 168  hours. No results for input(s): "AMMONIA" in the last 168 hours. Coagulation Profile: No results for input(s): "INR", "PROTIME" in the last 168 hours. Cardiac Enzymes: Recent Labs  Lab 11/20/22 1132  CKTOTAL 1,079*   BNP (last 3 results) No results for input(s): "PROBNP" in the last 8760 hours. HbA1C: No results for input(s): "HGBA1C" in the last 72 hours. CBG: Recent Labs  Lab 11/23/22 1628 11/23/22 2013 11/24/22 0011 11/24/22 0339 11/24/22 0738  GLUCAP 70 95 115* 91 83   Lipid Profile: No results for input(s): "CHOL", "HDL", "LDLCALC", "TRIG", "CHOLHDL", "LDLDIRECT" in the last 72 hours. Thyroid Function Tests: No results for input(s): "TSH", "T4TOTAL", "FREET4", "T3FREE", "THYROIDAB" in the last 72 hours. Anemia Panel: No results for input(s): "VITAMINB12", "FOLATE", "FERRITIN", "TIBC", "IRON", "RETICCTPCT" in the last 72 hours. Urine analysis:    Component Value Date/Time   COLORURINE AMBER (A) 11/17/2022 1410   APPEARANCEUR CLOUDY (A) 11/17/2022 1410   LABSPEC 1.020 11/17/2022 1410   PHURINE 6.0 11/17/2022 1410   GLUCOSEU >=500 (A) 11/17/2022 1410   HGBUR LARGE (A) 11/17/2022 1410   BILIRUBINUR NEGATIVE 11/17/2022 1410   KETONESUR NEGATIVE 11/17/2022 1410   PROTEINUR >=300 (A) 11/17/2022 1410   UROBILINOGEN 0.2 12/31/2010 1003   NITRITE NEGATIVE 11/17/2022 1410   LEUKOCYTESUR NEGATIVE 11/17/2022 1410   Sepsis Labs: @LABRCNTIP (procalcitonin:4,lacticidven:4)  ) Recent Results (from the past 240 hour(s))  Blood culture (routine x 2)     Status: None    Collection Time: 11/17/22  3:25 PM   Specimen: BLOOD  Result Value Ref Range Status   Specimen Description BLOOD FEMORAL ARTERY  Final   Special Requests   Final    BOTTLES DRAWN AEROBIC AND ANAEROBIC Blood Culture adequate volume   Culture   Final    NO GROWTH 5 DAYS Performed at [facility], [address] [place], [place] [address]    Report Status 11/22/2022 FINAL  Final  Blood culture (routine x 2)     Status: None   Collection Time: 11/17/22  4:40 PM   Specimen: BLOOD RIGHT HAND  Result Value Ref Range Status   Specimen Description BLOOD RIGHT HAND  Final   Special Requests   Final    BOTTLES DRAWN AEROBIC AND ANAEROBIC Blood Culture results may not be optimal due to an inadequate volume of blood received in culture bottles   Culture   Final    NO GROWTH 5 DAYS Performed at [facility], [address], [place], [place] [address]    Report Status 11/22/2022 FINAL  Final  Culture, Respiratory w Gram Stain     Status: None   Collection Time: 11/17/22  4:42 PM   Specimen: Tracheal Aspirate; Respiratory  Result Value Ref Range Status   Specimen Description TRACHEAL ASPIRATE  Final   Special Requests NONE  Final   Gram Stain   Final    FEW GRAM POSITIVE COCCI IN PAIRS IN CLUSTERS RARE GRAM NEGATIVE RODS FEW WBC PRESENT, PREDOMINANTLY PMN    Culture   Final    FEW Normal respiratory flora-no Staph aureus or Pseudomonas seen Performed at [facility], [address] [place], [place] [address]    Report Status 11/20/2022 FINAL  Final  MRSA Next Gen by PCR, Nasal     Status: None   Collection Time: 11/17/22  5:53 PM   Specimen: Nasal Mucosa; Nasal Swab  Result Value Ref Range Status   MRSA by PCR Next Gen NOT DETECTED NOT DETECTED Final  Comment: (NOTE) The GeneXpert MRSA Assay (FDA approved for NASAL specimens only), is one component of a comprehensive MRSA colonization surveillance program. It is not intended to diagnose MRSA infection nor to  guide or monitor treatment for MRSA infections. Test performance is not FDA approved in patients less than 90 years old. Performed at [facility], [address] [place], [place] [address]   Respiratory (~20 pathogens) panel by PCR     Status: Abnormal   Collection Time: 11/18/22  1:47 AM   Specimen: Nasopharyngeal Swab; Respiratory  Result Value Ref Range Status   Adenovirus NOT DETECTED NOT DETECTED Final   Coronavirus 229E NOT DETECTED NOT DETECTED Final    Comment: (NOTE) The Coronavirus on the Respiratory Panel, DOES NOT test for the novel  Coronavirus (2019 nCoV)    Coronavirus HKU1 NOT DETECTED NOT DETECTED Final   Coronavirus NL63 NOT DETECTED NOT DETECTED Final   Coronavirus OC43 NOT DETECTED NOT DETECTED Final   Metapneumovirus NOT DETECTED NOT DETECTED Final   Rhinovirus / Enterovirus DETECTED (A) NOT DETECTED Final   Influenza A NOT DETECTED NOT DETECTED Final   Influenza B NOT DETECTED NOT DETECTED Final   Parainfluenza Virus 1 NOT DETECTED NOT DETECTED Final   Parainfluenza Virus 2 NOT DETECTED NOT DETECTED Final   Parainfluenza Virus 3 NOT DETECTED NOT DETECTED Final   Parainfluenza Virus 4 NOT DETECTED NOT DETECTED Final   Respiratory Syncytial Virus NOT DETECTED NOT DETECTED Final   Bordetella pertussis NOT DETECTED NOT DETECTED Final   Bordetella Parapertussis NOT DETECTED NOT DETECTED Final   Chlamydophila pneumoniae NOT DETECTED NOT DETECTED Final   Mycoplasma pneumoniae NOT DETECTED NOT DETECTED Final    Comment: Performed at [facility], [address] [place], [place] [address]     Radiology Studies: No results found.   Scheduled Meds:  amiodarone  200 mg Oral Daily   arformoterol  15 mcg Nebulization BID   aspirin EC  81 mg Oral Daily   budesonide (PULMICORT) nebulizer solution  0.5 mg Nebulization BID   docusate sodium  100 mg Oral BID   empagliflozin  10 mg Oral Daily   feeding supplement  237 mL Oral TID BM   heparin  5,000  Units Subcutaneous Q8H   lidocaine  1 patch Transdermal Q24H   mexiletine  150 mg Oral Q12H   multivitamin with minerals  1 tablet Oral Daily   mouth rinse  15 mL Mouth Rinse QID   revefenacin  175 mcg Nebulization Daily   spironolactone  12.5 mg Oral Daily   torsemide  20 mg Oral Daily   Continuous Infusions:  sodium chloride Stopped (11/21/22 1614)     LOS: 7 days    Time spent:    Zannie Cove, MD [facility]   11/24/2022, 9:56 AM

## 2022-11-25 DIAGNOSIS — I472 Ventricular tachycardia, unspecified: Secondary | ICD-10-CM | POA: Diagnosis not present

## 2022-11-25 DIAGNOSIS — I5022 Chronic systolic (congestive) heart failure: Secondary | ICD-10-CM | POA: Diagnosis not present

## 2022-11-25 DIAGNOSIS — I469 Cardiac arrest, cause unspecified: Secondary | ICD-10-CM | POA: Diagnosis not present

## 2022-11-25 DIAGNOSIS — R931 Abnormal findings on diagnostic imaging of heart and coronary circulation: Secondary | ICD-10-CM | POA: Diagnosis not present

## 2022-11-25 LAB — CBC
HCT: 47 % (ref 39.0–52.0)
Hemoglobin: 15.1 g/dL (ref 13.0–17.0)
MCH: 26.3 pg (ref 26.0–34.0)
MCHC: 32.1 g/dL (ref 30.0–36.0)
MCV: 81.9 fL (ref 80.0–100.0)
Platelets: 190 10*3/uL (ref 150–400)
RBC: 5.74 MIL/uL (ref 4.22–5.81)
RDW: 17.5 % — ABNORMAL HIGH (ref 11.5–15.5)
WBC: 6.8 10*3/uL (ref 4.0–10.5)
nRBC: 0 % (ref 0.0–0.2)

## 2022-11-25 LAB — BASIC METABOLIC PANEL
Anion gap: 14 (ref 5–15)
BUN: 50 mg/dL — ABNORMAL HIGH (ref 8–23)
CO2: 24 mmol/L (ref 22–32)
Calcium: 9.4 mg/dL (ref 8.9–10.3)
Chloride: 92 mmol/L — ABNORMAL LOW (ref 98–111)
Creatinine, Ser: 2.01 mg/dL — ABNORMAL HIGH (ref 0.61–1.24)
GFR, Estimated: 35 mL/min — ABNORMAL LOW (ref >60)
Glucose, Bld: 89 mg/dL (ref 70–99)
Potassium: 4.7 mmol/L (ref 3.5–5.1)
Sodium: 130 mmol/L — ABNORMAL LOW (ref 135–145)

## 2022-11-25 LAB — GLUCOSE, CAPILLARY
Glucose-Capillary: 104 mg/dL — ABNORMAL HIGH (ref 70–99)
Glucose-Capillary: 112 mg/dL — ABNORMAL HIGH (ref 70–99)
Glucose-Capillary: 121 mg/dL — ABNORMAL HIGH (ref 70–99)
Glucose-Capillary: 133 mg/dL — ABNORMAL HIGH (ref 70–99)
Glucose-Capillary: 87 mg/dL (ref 70–99)
Glucose-Capillary: 92 mg/dL (ref 70–99)

## 2022-11-25 LAB — MAGNESIUM: Magnesium: 2.5 mg/dL — ABNORMAL HIGH (ref 1.7–2.4)

## 2022-11-25 MED ORDER — ACETAMINOPHEN 325 MG PO TABS
650.0000 mg | ORAL_TABLET | Freq: Four times a day (QID) | ORAL | Status: DC | PRN
  Administered 2022-11-25 (×2): 650 mg via ORAL
  Filled 2022-11-25 (×2): qty 2

## 2022-11-25 MED ORDER — TRAMADOL HCL 50 MG PO TABS
50.0000 mg | ORAL_TABLET | Freq: Four times a day (QID) | ORAL | Status: DC | PRN
  Administered 2022-11-25 – 2022-11-26 (×3): 100 mg via ORAL
  Filled 2022-11-25 (×3): qty 2

## 2022-11-25 NOTE — Progress Notes (Signed)
Physical Therapy Treatment Patient Details Name: Edward Mullen MRN: [id number] DOB: Dec 25, 1955 Today's Date: 11/25/2022   History of Present Illness 66 yo male admitted 4/7 after found unresponsive in parking lot with PEA arrest and ROSC after 8 min. Extubated 4/10. PMHx: HTN, T2DM, CHF, polysubstance abuse, NICM, CKD, COPD, HLD, ICD and PPM    PT Comments    Pt tolerated treatment well today. PT was able to increase ambulation distance today. Pt anticipates DC home tomorrow. No change in DC/DME recs at this time. PT will continue to follow.   Recommendations for follow up therapy are one component of a multi-disciplinary discharge planning process, led by the attending physician.  Recommendations may be updated based on patient status, additional functional criteria and insurance authorization.  Follow Up Recommendations       Assistance Recommended at Discharge Set up Supervision/Assistance  Patient can return home with the following A little help with walking and/or transfers;A little help with bathing/dressing/bathroom;Assist for transportation;Assistance with cooking/housework   Equipment Recommendations  None recommended by PT    Recommendations for Other Services       Precautions / Restrictions Precautions Precautions: Fall Restrictions Weight Bearing Restrictions: No     Mobility  Bed Mobility Overal bed mobility: Modified Independent             General bed mobility comments: HOB elevated    Transfers Overall transfer level: Needs assistance Equipment used: Rolling walker (2 wheels) Transfers: Sit to/from Stand Sit to Stand: Min guard           General transfer comment: Pt able to push off of bed. Required increased time    Ambulation/Gait Ambulation/Gait assistance: Supervision Gait Distance (Feet): 400 Feet Assistive device: Rolling walker (2 wheels) Gait Pattern/deviations: Step-through pattern, Decreased stride length, Trunk  flexed Gait velocity: decreased     General Gait Details: cues for posture, proximity to RW and safety   Stairs             Wheelchair Mobility    Modified Rankin (Stroke Patients Only)       Balance Overall balance assessment: Mild deficits observed, not formally tested                                          Cognition Arousal/Alertness: Awake/alert Behavior During Therapy: WFL for tasks assessed/performed Overall Cognitive Status: Within Functional Limits for tasks assessed                                          Exercises      General Comments General comments (skin integrity, edema, etc.): VSS on RA      Pertinent Vitals/Pain Pain Assessment Pain Assessment: Faces Faces Pain Scale: Hurts a little bit Pain Location: chest from compressions Pain Descriptors / Indicators: Discomfort, Grimacing, Guarding Pain Intervention(s): Monitored during session, Premedicated before session    Home Living                          Prior Function            PT Goals (current goals can now be found in the care plan section) Progress towards PT goals: Progressing toward goals    Frequency    Min 1X/week  PT Plan Current plan remains appropriate    Co-evaluation              AM-PAC PT "6 Clicks" Mobility   Outcome Measure  Help needed turning from your back to your side while in a flat bed without using bedrails?: None Help needed moving from lying on your back to sitting on the side of a flat bed without using bedrails?: None Help needed moving to and from a bed to a chair (including a wheelchair)?: A Little Help needed standing up from a chair using your arms (e.g., wheelchair or bedside chair)?: A Little Help needed to walk in hospital room?: A Little Help needed climbing 3-5 steps with a railing? : A Little 6 Click Score: 20    End of Session Equipment Utilized During Treatment: Gait  belt Activity Tolerance: Patient tolerated treatment well Patient left: in bed;with call bell/phone within reach Nurse Communication: Mobility status PT Visit Diagnosis: Other abnormalities of gait and mobility (R26.89)     Time: 2947-6546 PT Time Calculation (min) (ACUTE ONLY): 15 min  Charges:  $Gait Training: 8-22 mins                     Shela Nevin, PT, DPT Acute Rehab Services [phone]    [name] 11/25/2022, 2:51 PM

## 2022-11-25 NOTE — Progress Notes (Signed)
PROGRESS NOTE    Edward Mullen  ZOX:[id number] DOB: June 22, 1956 DOA: 11/17/2022 PCP: Morene Crocker, MD  66/M with history of chronic systolic CHF, NICM, CAD, type 2 diabetes mellitus, hypertension, LBBB, history of BiV ICD, polysubstance abuse, CKD 3 A was admitted to the hospital after an out of hospital PEA arrest, found unresponsive in a parking lot with agonal respirations and pinpoint pupils, noted to be in PEA arrest, CPR was initiated, achieved ROSC in 8 minutes, intubated in the ER, subsequently became hypotensive, had copious amounts of frothy bloody sputum from ET tube, started on norepinephrine followed by epinephrine infusion and admitted to the ICU, labs noted creatinine of 2.0, potassium 6.1, UDS was positive for cocaine benzo and THC. -Respiratory cultures with few gram-positive cocci in pairs and clusters, on 4/8 had VT overnight, positive rhinovirus on respiratory virus panel. -4/8, 2D echo with EF 20-25% moderately reduced RV, hazy mobile density on aortic valve cusp -4/10: Extubated  -4/12: Transferred to [facility] service 4/13: given IV lasix, continues to have chest pain and Dyspnea  Subjective: -Feels poorly, has considerable chest wall pain, coughing and congested  Assessment and Plan:  PEA arrest -ROSC in -Echo 4/8 with EF 20-25%.  Moderately reduced RV hazy mobile density on aortic valve  -UDS +cocaine, BZDs, THC.  -EP interrogated PPM with no events recorded.  -Cards following -Suspected to be combination of hypoxic event + cocaine use. -Discharge planning   Cardiogenic shock status postcardiac arrest Chronic systolic CHF, - NSVT, prolonged QTc -known nonischemic cardiomyopathy with EF 20-25%, -CT on admission noted bilateral groundglass opacities consistent with pulmonary edema versus contusions and multiple rib fractures -Weaned off pressors  -Diuresed with IV Lasix, now on torsemide, continue Jardiance, Aldactone,  -Mild bump in creatinine,  holding torsemide today  Chest wall pain -Has a distal sternal fracture and multiple rib fractures -Pain control, continue lidocaine patch, encouraged incentive spirometry and flutter valve  Mobile density on aortic valve -Reported on echo 4/8,?  Thrombus, clinically do not suspect endocarditis -Reviewed with cards 4/12, they do not suspect endocarditis, will need repeat echo down the road  Acute hypoxic respiratory failure  History of COPD -Multifactorial from pulmonary edema and rhinovirus infection, also treated for aspiration pneumonia in ICU - extubated 4/10, - failed SLP 4/10.  Passed 4/11, now on dysphagia 3 diet  -Completed 5 days of antibiotics  - cont BD, Brovana/Yupelri, Pulmicort - cont aggressive pulmonary hygiene > IS/ mobilize, PT/ OT    Acute metabolic and possibly toxic encephalopathy.  - EEG 4/8 with severe diffuse encephalopathy, no seizures.CT Head NAICA, parenchymal atrophy/chronic small vessel disease. - resolved.  Cont supportive care   Acute on chronic renal failure.  - Baseline CKD stage IIIa Hyperkalemia, resolved  -Mild uptrend with diuresis, holding torsemide today  Enlarged prostate CT A/P 4/7 with markedly enlarged prostate gland protruding into bladder base. - PSA 2.32 (stable from 1 year prior) - Outpatient f/u - voiding w/o difficulty   Polysubstance abuse UDS + cocaine, BZD, THC. -Counseled   Thrombocytopenia -Mild, monitor  DVT prophylaxis: Heparin subcutaneous Code Status: Full code Family Communication: None present Disposition Plan: Home likely tomorrow  Consultants:    Procedures:   Antimicrobials:    Objective: Vitals:   11/25/22 0406 11/25/22 0851 11/25/22 0947 11/25/22 1121  BP: 99/70  104/77 112/85  Pulse:    79  Resp: Temp: 98.2 F (36.8 C)  98.2 F (36.8 C) 98 F (36.7 C)  TempSrc:  Oral  Oral Oral  SpO2: 99% 99% 96% 98%  Weight: 85 kg     Height:        Intake/Output Summary (Last 24 hours)  at 11/25/2022 1138 Last data filed at 11/25/2022 0240 Gross per 24 hour  Intake 840 ml  Output 850 ml  Net -10 ml   Filed Weights   11/23/22 0357 11/24/22 0339 11/25/22 0406  Weight: 86.8 kg 84.9 kg 85 kg    Examination:  General exam: Elderly chronically ill male appears older than stated age, AAOx3 HEENT: No JVD CVS: S1-S2, regular rhythm Lungs: Few scattered rhonchi Abdomen: Soft, nontender, bowel sounds present  EXTR: No edema Skin: No rashes Psychiatry:  Mood & affect appropriate.     Data Reviewed:   CBC: Recent Labs  Lab 11/20/22 1132 11/21/22 0445 11/22/22 0116 11/24/22 0044 11/25/22 0127  WBC 8.2 7.8 6.7 7.2 6.8  HGB 14.3 13.2 13.6 14.7 15.1  HCT 44.2 42.4 43.1 45.8 47.0  MCV 81.7 83.5 86.4 81.2 81.9  PLT 137* 109* 145* 178 190   Basic Metabolic Panel: Recent Labs  Lab 11/18/22 1711 11/19/22 0337 11/19/22 1717 11/20/22 0409 11/20/22 1132 11/21/22 0445 11/21/22 1139 11/22/22 0116 11/23/22 0026 11/24/22 0044 11/25/22 0127  NA 136   < > 137 136   < > 136 135 134* 132* 130* 130*  K 4.4   < > 4.3 4.3   < > 4.5 4.7 4.6 4.0 4.3 4.7  CL 100   < > 98 100   < > 102 100 99 98 94* 92*  CO2 20*   < > 25 20*   < > 24 21* 21* 25 24 24   GLUCOSE 102*   < > 83 109*   < > 78 75 82 87 121* 89  BUN 20   < > 23 28*   < > 39* 40* 38* 41* 43* 50*  CREATININE 1.70*   < > 1.73* 1.41*   < > 1.66* 1.67* 1.59* 1.60* 1.86* 2.01*  CALCIUM 8.5*   < > 8.9 8.9   < > 8.7* 8.7* 8.8* 9.1 9.5 9.4  MG 2.5*   < > 2.3 2.4  --  2.6*  --  2.5*  --   --  2.5*  PHOS 2.7  --  3.9 3.8  --  4.1  --   --   --   --   --    < > = values in this interval not displayed.   GFR: Estimated Creatinine Clearance: 38.6 mL/min (A) (by C-G formula based on SCr of 2.01 mg/dL (H)). Liver Function Tests: Recent Labs  Lab 11/18/22 1711 11/19/22 0337 11/19/22 1717 11/20/22 1132 11/21/22 0445  AST 48* 43* 45* 68*  --   ALT 36 35 34 45*  --   ALKPHOS 95 91 95 106  --   BILITOT 1.5* 1.5* 1.2 1.4*   --   PROT 7.0 6.6 7.9 8.2*  --   ALBUMIN 2.8* 2.6* 3.1* 3.1* 3.0*   No results for input(s): "LIPASE", "AMYLASE" in the last 168 hours. No results for input(s): "AMMONIA" in the last 168 hours. Coagulation Profile: No results for input(s): "INR", "PROTIME" in the last 168 hours. Cardiac Enzymes: Recent Labs  Lab 11/20/22 1132  CKTOTAL 1,079*   BNP (last 3 results) No results for input(s): "PROBNP" in the last 8760 hours. HbA1C: No results for input(s): "HGBA1C" in the last 72 hours. CBG: Recent Labs  Lab 11/24/22 2017 11/25/22 0020 11/25/22  0404 11/25/22 0745 11/25/22 1120  GLUCAP 78 121* 92 133* 87   Lipid Profile: No results for input(s): "CHOL", "HDL", "LDLCALC", "TRIG", "CHOLHDL", "LDLDIRECT" in the last 72 hours. Thyroid Function Tests: No results for input(s): "TSH", "T4TOTAL", "FREET4", "T3FREE", "THYROIDAB" in the last 72 hours. Anemia Panel: No results for input(s): "VITAMINB12", "FOLATE", "FERRITIN", "TIBC", "IRON", "RETICCTPCT" in the last 72 hours. Urine analysis:    Component Value Date/Time   COLORURINE AMBER (A) 11/17/2022 1410   APPEARANCEUR CLOUDY (A) 11/17/2022 1410   LABSPEC 1.020 11/17/2022 1410   PHURINE 6.0 11/17/2022 1410   GLUCOSEU >=500 (A) 11/17/2022 1410   HGBUR LARGE (A) 11/17/2022 1410   BILIRUBINUR NEGATIVE 11/17/2022 1410   KETONESUR NEGATIVE 11/17/2022 1410   PROTEINUR >=300 (A) 11/17/2022 1410   UROBILINOGEN 0.2 12/31/2010 1003   NITRITE NEGATIVE 11/17/2022 1410   LEUKOCYTESUR NEGATIVE 11/17/2022 1410   Sepsis Labs: @LABRCNTIP (procalcitonin:4,lacticidven:4)  ) Recent Results (from the past 240 hour(s))  Blood culture (routine x 2)     Status: None   Collection Time: 11/17/22  3:25 PM   Specimen: BLOOD  Result Value Ref Range Status   Specimen Description BLOOD FEMORAL ARTERY  Final   Special Requests   Final    BOTTLES DRAWN AEROBIC AND ANAEROBIC Blood Culture adequate volume   Culture   Final    NO GROWTH 5  DAYS Performed at [facility], [address] [place], [place] [address]    Report Status 11/22/2022 FINAL  Final  Blood culture (routine x 2)     Status: None   Collection Time: 11/17/22  4:40 PM   Specimen: BLOOD RIGHT HAND  Result Value Ref Range Status   Specimen Description BLOOD RIGHT HAND  Final   Special Requests   Final    BOTTLES DRAWN AEROBIC AND ANAEROBIC Blood Culture results may not be optimal due to an inadequate volume of blood received in culture bottles   Culture   Final    NO GROWTH 5 DAYS Performed at [facility], [address] [place], [place] [address]    Report Status 11/22/2022 FINAL  Final  Culture, Respiratory w Gram Stain     Status: None   Collection Time: 11/17/22  4:42 PM   Specimen: Tracheal Aspirate; Respiratory  Result Value Ref Range Status   Specimen Description TRACHEAL ASPIRATE  Final   Special Requests NONE  Final   Gram Stain   Final    FEW GRAM POSITIVE COCCI IN PAIRS IN CLUSTERS RARE GRAM NEGATIVE RODS FEW WBC PRESENT, PREDOMINANTLY PMN    Culture   Final    FEW Normal respiratory flora-no Staph aureus or Pseudomonas seen Performed at [facility], [address] [place], [place] [address]    Report Status 11/20/2022 FINAL  Final  MRSA Next Gen by PCR, Nasal     Status: None   Collection Time: 11/17/22  5:53 PM   Specimen: Nasal Mucosa; Nasal Swab  Result Value Ref Range Status   MRSA by PCR Next Gen NOT DETECTED NOT DETECTED Final    Comment: (NOTE) The GeneXpert MRSA Assay (FDA approved for NASAL specimens only), is one component of a comprehensive MRSA colonization surveillance program. It is not intended to diagnose MRSA infection nor to guide or monitor treatment for MRSA infections. Test performance is not FDA approved in patients less than 63 years old. Performed at [facility], [address] [place], [place] [address]   Respiratory (~20  pathogens) panel by PCR     Status: Abnormal    Collection Time: 11/18/22  1:47 AM   Specimen: Nasopharyngeal Swab; Respiratory  Result Value Ref Range Status   Adenovirus NOT DETECTED NOT DETECTED Final   Coronavirus 229E NOT DETECTED NOT DETECTED Final    Comment: (NOTE) The Coronavirus on the Respiratory Panel, DOES NOT test for the novel  Coronavirus (2019 nCoV)    Coronavirus HKU1 NOT DETECTED NOT DETECTED Final   Coronavirus NL63 NOT DETECTED NOT DETECTED Final   Coronavirus OC43 NOT DETECTED NOT DETECTED Final   Metapneumovirus NOT DETECTED NOT DETECTED Final   Rhinovirus / Enterovirus DETECTED (A) NOT DETECTED Final   Influenza A NOT DETECTED NOT DETECTED Final   Influenza B NOT DETECTED NOT DETECTED Final   Parainfluenza Virus 1 NOT DETECTED NOT DETECTED Final   Parainfluenza Virus 2 NOT DETECTED NOT DETECTED Final   Parainfluenza Virus 3 NOT DETECTED NOT DETECTED Final   Parainfluenza Virus 4 NOT DETECTED NOT DETECTED Final   Respiratory Syncytial Virus NOT DETECTED NOT DETECTED Final   Bordetella pertussis NOT DETECTED NOT DETECTED Final   Bordetella Parapertussis NOT DETECTED NOT DETECTED Final   Chlamydophila pneumoniae NOT DETECTED NOT DETECTED Final   Mycoplasma pneumoniae NOT DETECTED NOT DETECTED Final    Comment: Performed at [facility], [address], [place], [place] [address]     Radiology Studies: DG CHEST PORT 1 VIEW  Result Date: 11/24/2022 CLINICAL DATA:  Dyspnea, cardiac arrest EXAM: PORTABLE CHEST 1 VIEW COMPARISON:  Chest x-ray November 19, 2022 FINDINGS: Interval extubation and removal of the NG tube. Intact left chest wall 3 lead AICD. The cardiomediastinal silhouette is unchanged in contour. No focal pulmonary opacity. No pleural effusion or pneumothorax. The visualized upper abdomen is unremarkable. No displaced rib fractures. IMPRESSION: 1. No acute pulmonary abnormality. 2. Unchanged cardiomegaly. Electronically Signed   By: Jacob Moores M.D.   On: 11/24/2022 12:01     Scheduled Meds:   amiodarone  200 mg Oral Daily   arformoterol  15 mcg Nebulization BID   aspirin EC  81 mg Oral Daily   budesonide (PULMICORT) nebulizer solution  0.5 mg Nebulization BID   carvedilol  3.125 mg Oral BID   docusate sodium  100 mg Oral BID   empagliflozin  10 mg Oral Daily   feeding supplement  237 mL Oral TID BM   heparin  5,000 Units Subcutaneous Q8H   lidocaine  1 patch Transdermal Q24H   mexiletine  150 mg Oral Q12H   multivitamin with minerals  1 tablet Oral Daily   mouth rinse  15 mL Mouth Rinse QID   revefenacin  175 mcg Nebulization Daily   spironolactone  12.5 mg Oral Daily   torsemide  20 mg Oral Daily   Continuous Infusions:  sodium chloride Stopped (11/21/22 1614)     LOS: 8 days    Time spent:    Zannie Cove, MD [facility]   11/25/2022, 11:38 AM

## 2022-11-25 NOTE — TOC Transition Note (Signed)
Discharge medications (4) have been retrieved from Main Pharm[facility] weekend lockup and are now being stored in the Transitions of Care (TOC) Pharmacy on the second floor until patient is ready for discharge.   

## 2022-11-25 NOTE — Progress Notes (Signed)
Advanced Heart Failure Rounding Note  PCP-Cardiologist: None   Subjective:   4/10 Extubated. Norepi weaned off.   Continues with chest soreness from chest compressions.   Objective:   Weight Range: 85 kg Body mass index is 25.43 kg/m.   Vital Signs:   Temp:  [97.5 F (36.4 C)-98.6 F (37 C)] 98.2 F (36.8 C) (04/15 0406) Pulse Rate:  [70-89] 70 (04/15 0023) Resp:  [17-18] 17 (04/15 0406) BP: (90-119)/(62-89) 99/70 (04/15 0406) SpO2:  [93 %-99 %] 99 % (04/15 0851) Weight:  [85 kg] 85 kg (04/15 0406) Last BM Date : 11/24/22  Weight change: Filed Weights   11/23/22 0357 11/24/22 0339 11/25/22 0406  Weight: 86.8 kg 84.9 kg 85 kg    Intake/Output:   Intake/Output Summary (Last 24 hours) at 11/25/2022 0900 Last data filed at 11/25/2022 0814 Gross per 24 hour  Intake 840 ml  Output 850 ml  Net -10 ml     Physical Exam    General:  well appearing.  No respiratory difficulty HEENT: normal Neck: supple. JVD ~6/7 cm. Carotids 2+ bilat; no bruits. No lymphadenopathy or thyromegaly appreciated. Cor: PMI nondisplaced. Regular rate & rhythm. No rubs, gallops or murmurs. Lungs: clear Abdomen: soft, nontender, nondistended. No hepatosplenomegaly. No bruits or masses. Good bowel sounds. Extremities: no cyanosis, clubbing, rash, edema  Neuro: alert & oriented x 3, cranial nerves grossly intact. moves all 4 extremities w/o difficulty. Affect pleasant.   Telemetry  A sensed V paced 90s (Personally reviewed)    EKG    N/A   Labs    CBC Recent Labs    11/24/22 0044 11/25/22 0127  WBC 7.2 6.8  HGB 14.7 15.1  HCT 45.8 47.0  MCV 81.2 81.9  PLT 178 190   Basic Metabolic Panel Recent Labs    40/34/74 0044 11/25/22 0127  NA 130* 130*  K 4.3 4.7  CL 94* 92*  CO2 24 24  GLUCOSE 121* 89  BUN 43* 50*  CREATININE 1.86* 2.01*  CALCIUM 9.5 9.4   Liver Function Tests No results for input(s): "AST", "ALT", "ALKPHOS", "BILITOT", "PROT", "ALBUMIN" in the last 72  hours.  No results for input(s): "LIPASE", "AMYLASE" in the last 72 hours. Cardiac Enzymes No results for input(s): "CKTOTAL", "CKMB", "CKMBINDEX", "TROPONINI" in the last 72 hours.   BNP: BNP (last 3 results) Recent Labs    04/04/22 1202 06/07/22 0142  BNP 212.5* 2,548.2*    ProBNP (last 3 results) No results for input(s): "PROBNP" in the last 8760 hours.   D-Dimer No results for input(s): "DDIMER" in the last 72 hours. Hemoglobin A1C No results for input(s): "HGBA1C" in the last 72 hours. Fasting Lipid Panel No results for input(s): "CHOL", "HDL", "LDLCALC", "TRIG", "CHOLHDL", "LDLDIRECT" in the last 72 hours.  Thyroid Function Tests No results for input(s): "TSH", "T4TOTAL", "T3FREE", "THYROIDAB" in the last 72 hours.  Invalid input(s): "FREET3"  Other results:   Imaging    DG CHEST PORT 1 VIEW  Result Date: 11/24/2022 CLINICAL DATA:  Dyspnea, cardiac arrest EXAM: PORTABLE CHEST 1 VIEW COMPARISON:  Chest x-ray November 19, 2022 FINDINGS: Interval extubation and removal of the NG tube. Intact left chest wall 3 lead AICD. The cardiomediastinal silhouette is unchanged in contour. No focal pulmonary opacity. No pleural effusion or pneumothorax. The visualized upper abdomen is unremarkable. No displaced rib fractures. IMPRESSION: 1. No acute pulmonary abnormality. 2. Unchanged cardiomegaly. Electronically Signed   By: Jacob Moores M.D.   On: 11/24/2022 12:01  Medications:     Scheduled Medications:  amiodarone  200 mg Oral Daily   arformoterol  15 mcg Nebulization BID   aspirin EC  81 mg Oral Daily   budesonide (PULMICORT) nebulizer solution  0.5 mg Nebulization BID   carvedilol  3.125 mg Oral BID   docusate sodium  100 mg Oral BID   empagliflozin  10 mg Oral Daily   feeding supplement  237 mL Oral TID BM   heparin  5,000 Units Subcutaneous Q8H   lidocaine  1 patch Transdermal Q24H   mexiletine  150 mg Oral Q12H   multivitamin with minerals  1 tablet Oral  Daily   mouth rinse  15 mL Mouth Rinse QID   revefenacin  175 mcg Nebulization Daily   spironolactone  12.5 mg Oral Daily   torsemide  20 mg Oral Daily    Infusions:  sodium chloride Stopped (11/21/22 1614)    PRN Medications: sodium chloride, ipratropium-albuterol, mouth rinse, oxyCODONE, polyethylene glycol    Patient Profile     Mr Edward Mullen is 67 year old with a history of HTN, DMI. CAD, VT, LBBB, BiV ICD, CKD Stage IIIa, polysubstance abuse, NIMC, and HFrEF. Followed in the HF clinic several years.     Admitted PEA arrest c/b cardiogenic shock.  Assessment/Plan  1. Out of hospital PEA arrest-->Cardiogenic Shock  - Found down. Unwitnessed. EMS rhythm PEA. CPR ROSC ~ 8 minutes.    2. Acute Respiratory Failure  - Intubated in ED --> extubated 4/12 - Completed Unasyn for possible aspiration pneumonia - Now on room air.   3. Chronic HFrEf, NICM -Suspected HTN/Cocaine Abuse. Has BiV - Echo EF 20-25% RV moderately reduced.  - Volume stable, on Torsemide 20 mg daily. Will hold dose today, plan to restart tomorrow - Continue Jardiance 10 mg daily - Continue spiro 12.5 mg daily, monitor with hx hyperkalemia on admission (would not uptitrate) - carvedilol 3.125 BID started yesterday    4. AKI -Creatinine baseline ~ 1.5  -Admit creatinine 2.1-->1.6 ->1.6->1.4 >1.66-> 1.59-> 1.86> 2 -Follow daily BMET.    5. NSVT with h/o VT - K 4.7 Mag 2.5 (recheck Mg) - Continue mexilitene   6. Polysubstance Abuse -UDS + cocaine + Benz+ THC -Will ask TOC to provide with substance abuse options.  -Discussed cessation.    7. Hyperkalemia  - K 6.1 on admit. Resolved. 4.7 today.  - Continue 12.5 mg spiro, would not increase  8. Rhinovirus - continues with intt SOB. Suspect 2/2 #8 and not d/t fluid.  - Continue IS and flutter valve - CXR yesterday nothing acute - chest PT? - encourage ambulation as tolerated  Length of Stay: 8  Alen Bleacher, NP  11/25/2022, 9:00 AM  Advanced  Heart Failure Team Pager [phone] (M-F; 7a - 5p)  Please contact [facility] for night-coverage after hours (5p -7a ) and weekends on [email or web address]

## 2022-11-25 NOTE — Progress Notes (Signed)
Mobility Specialist Progress Note:   11/25/22 0930  Mobility  Activity Ambulated with assistance in hallway  Level of Assistance Contact guard assist, steadying assist  Assistive Device Front wheel walker  Distance Ambulated (ft) 150 ft  Activity Response Tolerated well  Mobility Referral Yes  $Mobility charge 1 Mobility   Pt eager for mobility session. Required only minG during ambulation. No unsteadiness noted. VSS on RA. Pt back in bed with all needs met.   Addison Lank Mobility Specialist Please contact via SecureChat or  Rehab office at [phone]

## 2022-11-26 ENCOUNTER — Other Ambulatory Visit (HOSPITAL_COMMUNITY): Payer: Self-pay

## 2022-11-26 DIAGNOSIS — I469 Cardiac arrest, cause unspecified: Secondary | ICD-10-CM | POA: Diagnosis not present

## 2022-11-26 LAB — BASIC METABOLIC PANEL
Anion gap: 10 (ref 5–15)
BUN: 50 mg/dL — ABNORMAL HIGH (ref 8–23)
CO2: 21 mmol/L — ABNORMAL LOW (ref 22–32)
Calcium: 8.9 mg/dL (ref 8.9–10.3)
Chloride: 98 mmol/L (ref 98–111)
Creatinine, Ser: 1.73 mg/dL — ABNORMAL HIGH (ref 0.61–1.24)
GFR, Estimated: 42 mL/min — ABNORMAL LOW (ref >60)
Glucose, Bld: 85 mg/dL (ref 70–99)
Potassium: 5.2 mmol/L — ABNORMAL HIGH (ref 3.5–5.1)
Sodium: 129 mmol/L — ABNORMAL LOW (ref 135–145)

## 2022-11-26 LAB — GLUCOSE, CAPILLARY: Glucose-Capillary: 100 mg/dL — ABNORMAL HIGH (ref 70–99)

## 2022-11-26 MED ORDER — EMPAGLIFLOZIN 10 MG PO TABS
10.0000 mg | ORAL_TABLET | Freq: Every day | ORAL | 0 refills | Status: DC
Start: 2022-11-26 — End: 2022-11-26
  Filled 2022-11-26: qty 30, 30d supply, fill #0

## 2022-11-26 MED ORDER — AMIODARONE HCL 200 MG PO TABS: 200.0000 mg | ORAL_TABLET | Freq: Every day | ORAL | 0 refills | Status: DC

## 2022-11-26 MED ORDER — ALUM & MAG HYDROXIDE-SIMETH 200-200-20 MG/5ML PO SUSP: 15.0000 mL | Freq: Once | ORAL | Status: DC | PRN

## 2022-11-26 MED ORDER — EMPAGLIFLOZIN 10 MG PO TABS
10.0000 mg | ORAL_TABLET | Freq: Every day | ORAL | 0 refills | Status: DC
Start: 2022-11-26 — End: 2023-03-26

## 2022-11-26 MED ORDER — TRAMADOL HCL 50 MG PO TABS
50.0000 mg | ORAL_TABLET | Freq: Four times a day (QID) | ORAL | 0 refills | Status: DC | PRN
  Filled 2022-11-26: qty 30, 8d supply, fill #0

## 2022-11-26 MED ORDER — SPIRONOLACTONE 12.5 MG HALF TABLET: 12.5000 mg | ORAL_TABLET | Freq: Every day | ORAL | Status: DC

## 2022-11-26 NOTE — Plan of Care (Signed)
Problem: Education: Goal: Ability to manage disease process will improve Outcome: Adequate for Discharge   Problem: Cardiac: Goal: Ability to achieve and maintain adequate cardiopulmonary perfusion will improve Outcome: Adequate for Discharge   Problem: Neurologic: Goal: Promote progressive neurologic recovery Outcome: Adequate for Discharge   Problem: Skin Integrity: Goal: Risk for impaired skin integrity will be minimized. Outcome: Adequate for Discharge   Problem: Education: Goal: Knowledge of General Education information will improve Description: Including pain rating scale, medication(s)/side effects and non-pharmacologic comfort measures Outcome: Adequate for Discharge   Problem: Health Behavior/Discharge Planning: Goal: Ability to manage health-related needs will improve Outcome: Adequate for Discharge   Problem: Clinical Measurements: Goal: Ability to maintain clinical measurements within normal limits will improve Outcome: Adequate for Discharge Goal: Will remain free from infection Outcome: Adequate for Discharge Goal: Diagnostic test results will improve Outcome: Adequate for Discharge Goal: Respiratory complications will improve Outcome: Adequate for Discharge Goal: Cardiovascular complication will be avoided Outcome: Adequate for Discharge   Problem: Clinical Measurements: Goal: Ability to maintain clinical measurements within normal limits will improve Outcome: Adequate for Discharge Goal: Will remain free from infection Outcome: Adequate for Discharge Goal: Diagnostic test results will improve Outcome: Adequate for Discharge Goal: Respiratory complications will improve Outcome: Adequate for Discharge Goal: Cardiovascular complication will be avoided Outcome: Adequate for Discharge   Problem: Activity: Goal: Risk for activity intolerance will decrease Outcome: Adequate for Discharge   Problem: Nutrition: Goal: Adequate nutrition will be  maintained Outcome: Adequate for Discharge   Problem: Coping: Goal: Level of anxiety will decrease Outcome: Adequate for Discharge   Problem: Elimination: Goal: Will not experience complications related to bowel motility Outcome: Adequate for Discharge Goal: Will not experience complications related to urinary retention Outcome: Adequate for Discharge   Problem: Pain Managment: Goal: General experience of comfort will improve Outcome: Adequate for Discharge   Problem: Safety: Goal: Ability to remain free from injury will improve Outcome: Adequate for Discharge   Problem: Skin Integrity: Goal: Risk for impaired skin integrity will decrease Outcome: Adequate for Discharge   Problem: Education: Goal: Knowledge of General Education information will improve Description: Including pain rating scale, medication(s)/side effects and non-pharmacologic comfort measures Outcome: Adequate for Discharge   Problem: Clinical Measurements: Goal: Ability to maintain clinical measurements within normal limits will improve Outcome: Adequate for Discharge Goal: Will remain free from infection Outcome: Adequate for Discharge Goal: Diagnostic test results will improve Outcome: Adequate for Discharge Goal: Respiratory complications will improve Outcome: Adequate for Discharge Goal: Cardiovascular complication will be avoided Outcome: Adequate for Discharge   Problem: Health Behavior/Discharge Planning: Goal: Ability to manage health-related needs will improve Outcome: Adequate for Discharge   Problem: Activity: Goal: Risk for activity intolerance will decrease Outcome: Adequate for Discharge   Problem: Nutrition: Goal: Adequate nutrition will be maintained Outcome: Adequate for Discharge   Problem: Coping: Goal: Level of anxiety will decrease Outcome: Adequate for Discharge   Problem: Elimination: Goal: Will not experience complications related to bowel motility Outcome: Adequate  for Discharge Goal: Will not experience complications related to urinary retention Outcome: Adequate for Discharge   Problem: Pain Managment: Goal: General experience of comfort will improve Outcome: Adequate for Discharge   Problem: Safety: Goal: Ability to remain free from injury will improve Outcome: Adequate for Discharge   Problem: Skin Integrity: Goal: Risk for impaired skin integrity will decrease Outcome: Adequate for Discharge   Problem: Malnutrition  (NI-5.2) Goal: Food and/or nutrient delivery Description: Individualized approach for food/nutrient provision. Outcome: Adequate for Discharge  Problem: Acute Rehab PT Goals(only PT should resolve) Goal: Pt Will Go Supine/Side To Sit Outcome: Adequate for Discharge Goal: Patient Will Transfer Sit To/From Stand Outcome: Adequate for Discharge Goal: Pt Will Ambulate Outcome: Adequate for Discharge Goal: Pt/caregiver will Perform Home Exercise Program Outcome: Adequate for Discharge   Problem: Education: Goal: Ability to demonstrate management of disease process will improve Outcome: Adequate for Discharge Goal: Ability to verbalize understanding of medication therapies will improve Outcome: Adequate for Discharge Goal: Individualized Educational Video(s) Outcome: Adequate for Discharge   Problem: Education: Goal: Ability to demonstrate management of disease process will improve Outcome: Adequate for Discharge Goal: Ability to verbalize understanding of medication therapies will improve Outcome: Adequate for Discharge Goal: Individualized Educational Video(s) Outcome: Adequate for Discharge   Problem: Activity: Goal: Capacity to carry out activities will improve Outcome: Adequate for Discharge   Problem: Cardiac: Goal: Ability to achieve and maintain adequate cardiopulmonary perfusion will improve Outcome: Adequate for Discharge

## 2022-11-26 NOTE — Progress Notes (Signed)
Advanced Heart Failure Rounding Note  PCP-Cardiologist: None   Subjective:   4/10 Extubated. Norepi weaned off.   Feels good this morning. Mild chest soreness this morning.   Objective:   Weight Range: 86 kg Body mass index is 25.71 kg/m.   Vital Signs:   Temp:  [97.5 F (36.4 C)-98.2 F (36.8 C)] 97.8 F (36.6 C) (04/16 0748) Pulse Rate:  [38-79] 38 (04/16 0748) Resp:  [14-18] 17 (04/16 0748) BP: (97-127)/(66-85) 118/66 (04/16 0748) SpO2:  [95 %-100 %] 100 % (04/16 0748) Weight:  [86 kg] 86 kg (04/16 0521) Last BM Date : 11/24/22  Weight change: Filed Weights   11/24/22 0339 11/25/22 0406 11/26/22 0521  Weight: 84.9 kg 85 kg 86 kg    Intake/Output:   Intake/Output Summary (Last 24 hours) at 11/26/2022 0831 Last data filed at 11/26/2022 0527 Gross per 24 hour  Intake 1180 ml  Output 1600 ml  Net -420 ml     Physical Exam    General:  well appearing.  No respiratory difficulty HEENT: normal Neck: supple. JVD ~6/7 cm. Carotids 2+ bilat; no bruits. No lymphadenopathy or thyromegaly appreciated. Cor: PMI nondisplaced. Regular rate & rhythm. No rubs, gallops or murmurs. Lungs: clear Abdomen: soft, nontender, nondistended. No hepatosplenomegaly. No bruits or masses. Good bowel sounds. Extremities: no cyanosis, clubbing, rash, edema  Neuro: alert & oriented x 3, cranial nerves grossly intact. moves all 4 extremities w/o difficulty. Affect pleasant.   Telemetry  NSR 60s  (Personally reviewed)    EKG    N/A   Labs    CBC Recent Labs    11/24/22 0044 11/25/22 0127  WBC 7.2 6.8  HGB 14.7 15.1  HCT 45.8 47.0  MCV 81.2 81.9  PLT 178 190   Basic Metabolic Panel Recent Labs    40/98/11 0127 11/26/22 0057  NA 130* 129*  K 4.7 5.2*  CL 92* 98  CO2 24 21*  GLUCOSE 89 85  BUN 50* 50*  CREATININE 2.01* 1.73*  CALCIUM 9.4 8.9  MG 2.5*  --    Liver Function Tests No results for input(s): "AST", "ALT", "ALKPHOS", "BILITOT", "PROT", "ALBUMIN" in  the last 72 hours.  No results for input(s): "LIPASE", "AMYLASE" in the last 72 hours. Cardiac Enzymes No results for input(s): "CKTOTAL", "CKMB", "CKMBINDEX", "TROPONINI" in the last 72 hours.   BNP: BNP (last 3 results) Recent Labs    04/04/22 1202 06/07/22 0142  BNP 212.5* 2,548.2*    ProBNP (last 3 results) No results for input(s): "PROBNP" in the last 8760 hours.   D-Dimer No results for input(s): "DDIMER" in the last 72 hours. Hemoglobin A1C No results for input(s): "HGBA1C" in the last 72 hours. Fasting Lipid Panel No results for input(s): "CHOL", "HDL", "LDLCALC", "TRIG", "CHOLHDL", "LDLDIRECT" in the last 72 hours.  Thyroid Function Tests No results for input(s): "TSH", "T4TOTAL", "T3FREE", "THYROIDAB" in the last 72 hours.  Invalid input(s): "FREET3"  Other results:   Imaging    No results found.   Medications:     Scheduled Medications:  amiodarone  200 mg Oral Daily   arformoterol  15 mcg Nebulization BID   aspirin EC  81 mg Oral Daily   budesonide (PULMICORT) nebulizer solution  0.5 mg Nebulization BID   carvedilol  3.125 mg Oral BID   docusate sodium  100 mg Oral BID   empagliflozin  10 mg Oral Daily   feeding supplement  237 mL Oral TID BM   heparin  5,000  Units Subcutaneous Q8H   lidocaine  1 patch Transdermal Q24H   mexiletine  150 mg Oral Q12H   multivitamin with minerals  1 tablet Oral Daily   mouth rinse  15 mL Mouth Rinse QID   revefenacin  175 mcg Nebulization Daily   [START ON 11/27/2022] spironolactone  12.5 mg Oral Daily   torsemide  20 mg Oral Daily    Infusions:  sodium chloride Stopped (11/21/22 1614)    PRN Medications: sodium chloride, acetaminophen, alum & mag hydroxide-simeth, ipratropium-albuterol, mouth rinse, oxyCODONE, polyethylene glycol, traMADol    Patient Profile     Mr Stull is 67 year old with a history of HTN, DMI. CAD, VT, LBBB, BiV ICD, CKD Stage IIIa, polysubstance abuse, NIMC, and HFrEF.  Followed in the HF clinic several years.     Admitted PEA arrest c/b cardiogenic shock.  Assessment/Plan  1. Out of hospital PEA arrest-->Cardiogenic Shock  - Found down. Unwitnessed. EMS rhythm PEA. CPR ROSC ~ 8 minutes.    2. Acute Respiratory Failure  - Intubated in ED --> extubated 4/12 - Completed Unasyn for possible aspiration pneumonia - Now on room air.   3. Chronic HFrEf, NICM -Suspected HTN/Cocaine Abuse. Has BiV - Echo EF 20-25% RV moderately reduced.  - Volume stable, on Torsemide 20 mg daily, restart today - Continue Jardiance 10 mg daily - Hyperkalemic with spiro twice now, will stop.  - previously on digoxin, will restart OP with current renal function - Continue carvedilol 3.125 BID     4. AKI -Creatinine baseline ~ 1.5  -Admit creatinine 2.1-->1.6 ->1.6->1.4 >1.66-> 1.59-> 1.86> 2> 1.73 -Follow daily BMET.    5. NSVT with h/o VT - K 5.2 Mag 2.5 (recheck Mg) - Continue mexilitene   6. Polysubstance Abuse -UDS + cocaine + Benz+ THC -Will ask TOC to provide with substance abuse options.  -Discussed cessation.    7. Hyperkalemia  - K 5.2 - stopping spiro, would not retry  8. Rhinovirus - continues with intt SOB. Suspect 2/2 #8 and not d/t fluid.  - Continue IS and flutter valve - CXR 4/14 nothing acute - encourage ambulation as tolerated - improving  Stable for discharge, pending MD eval. Has f/u in AHF clinic  AHF meds at discharge: Amiodarone 200 mg daily ASA 81 mg daily Crestor 5 mg daily Carvedilol 3.125 mg BID Jardiance 10 mg daily Mexiletine 150 mg Q12hrs Torsemide 20 mg daily  Length of Stay: 9  Alen Bleacher, NP  11/26/2022, 8:31 AM  Advanced Heart Failure Team Pager [phone] (M-F; 7a - 5p)  Please contact [facility] for night-coverage after hours (5p -7a ) and weekends on [email or web address]

## 2022-11-26 NOTE — TOC Progression Note (Signed)
Transition of Care [facility]) - Progression Note    Patient Details  Name: Edward Mullen MRN: [id number] Date of Birth: 11/05/1955  [facility] CM/SW Contact  [name] Cousin, RN Phone Number: 11/26/2022, 11:28 AM  Clinical Narrative:    Spoke to pt and states he has a RW, neb machine and scale at home. He requested a Rolling walker with seat but he received his RW in 2020. He is not eligible for another RW at this time. Contacted [facility] to make aware of dc home today. Pt states his wife will provide transportation home. Meds up from [facility] pharmacy.   Expected Discharge Plan: Home w Home Health Services Barriers to Discharge: No Barriers Identified  Expected Discharge Plan and Services   Discharge Planning Services: CM Consult Post Acute Care Choice: Home Health Living arrangements for the past 2 months: Single Family Home Expected Discharge Date: 11/26/22               DME Arranged: Dan Humphreys rolling with seat DME Agency: [employer] Date DME Agency Contacted: 11/22/22 Time DME Agency Contacted: [phone] Representative spoke with at DME Agency: Tommye Standard HH Arranged: RN, PT [facility] Agency: [facility] Date [facility] Agency Contacted: 11/22/22 Time HH Agency Contacted: [id number] Representative spoke with at [facility] Agency: Consuelo Pandy   Social Determinants of Health (SDOH) Interventions SDOH Screenings   Food Insecurity: No Food Insecurity (06/17/2022)  Recent Concern: Food Insecurity - Food Insecurity Present (06/08/2022)  Housing: Low Risk  (06/17/2022)  Transportation Needs: No Transportation Needs (06/17/2022)  Recent Concern: Transportation Needs - Unmet Transportation Needs (06/08/2022)  Utilities: Not At Risk (06/17/2022)  Alcohol Screen: Low Risk  (06/17/2022)  Depression (PHQ2-9): Low Risk  (06/17/2022)  Financial Resource Strain: Low Risk  (06/17/2022)  Physical Activity: Inactive (06/17/2022)  Social Connections: Moderately Isolated (06/17/2022)  Stress: No  Stress Concern Present (06/17/2022)  Tobacco Use: High Risk (11/17/2022)    Readmission Risk Interventions     No data to display

## 2022-11-26 NOTE — Progress Notes (Signed)
Mobility Specialist Progress Note:   11/26/22 0915  Mobility  Activity Ambulated with assistance in hallway  Level of Assistance Contact guard assist, steadying assist  Assistive Device Front wheel walker  Distance Ambulated (ft) 150 ft  Activity Response Tolerated well  Mobility Referral Yes  $Mobility charge 1 Mobility   Pt agreeable to mobility session. Required only minG assist throughout ambulation. No c/o throughout. Pt back in bed with all needs met, eager for d/c.  Addison Lank Mobility Specialist Please contact via SecureChat or  Rehab office at [phone]

## 2022-11-26 NOTE — Care Management Important Message (Signed)
Important Message  Patient Details  Name: Edward Mullen MRN: [id number] Date of Birth: 08-25-1955   Medicare Important Message Given:  Yes     Renie Ora 11/26/2022, 4:19 PM

## 2022-11-29 ENCOUNTER — Ambulatory Visit (INDEPENDENT_AMBULATORY_CARE_PROVIDER_SITE_OTHER): Payer: Medicare HMO

## 2022-11-29 VITALS — BP 116/72 | HR 73 | Temp 97.5°F | Wt 191.0 lb

## 2022-11-29 DIAGNOSIS — N179 Acute kidney failure, unspecified: Secondary | ICD-10-CM

## 2022-11-29 DIAGNOSIS — I469 Cardiac arrest, cause unspecified: Secondary | ICD-10-CM

## 2022-11-29 NOTE — Patient Instructions (Signed)
[name], it was a pleasure seeing you today!  Today we discussed:   I have ordered the following labs today:  Lab Orders         BMP8+Anion Gap       Tests ordered today:  none  Referrals ordered today:   Referral Orders  No referral(s) requested today     I have ordered the following medication/changed the following medications:   Stop the following medications: There are no discontinued medications.   Start the following medications: No orders of the defined types were placed in this encounter.    Follow-up:  as scheduled    Please make sure to arrive 15 minutes prior to your next appointment. If you arrive late, you may be asked to reschedule.   We look forward to seeing you next time. Please call our clinic at [phone] if you have any questions or concerns. The best time to call is Monday-Friday from 9am-4pm, but there is someone available 24/7. If after hours or the weekend, call the main hospital number and ask for the Internal Medicine Resident On-Call. If you need medication refills, please notify your pharmacy one week in advance and they will send [place] a request.  Thank you for letting us take part in your care. Wishing you the best!  Thank you, Adron Bene, MD

## 2022-11-29 NOTE — Progress Notes (Signed)
CC: HFU  HPI:  [name] is a 67 y.o. with past medical history as below who presents for HFU. See detailed assessment and plan for HPI.  Past Medical History:  Diagnosis Date   Arthritis    "feels like it in my legs" (09/20/2014)   CAD (coronary artery disease)    Chronic kidney disease, stage 3a    Chronic systolic CHF (congestive heart failure)    Cocaine abuse    Hypertension    NICM (nonischemic cardiomyopathy)    NSVT (nonsustained ventricular tachycardia) 09/06/2016   pre diabetes    patient denies   Review of Systems:  See detailed assessment and plan for pertinent ROS.  Physical Exam:  Vitals:   11/29/22 0940  BP: 116/72  Pulse: 73  Temp: (!) 97.5 F (36.4 C)  TempSrc: Oral  SpO2: 97%  Weight: 191 lb (86.6 kg)   Physical Exam Constitutional:      General: He is not in acute distress. HENT:     Head: Normocephalic and atraumatic.  Eyes:     Extraocular Movements: Extraocular movements intact.  Cardiovascular:     Rate and Rhythm: Normal rate and regular rhythm.     Heart sounds: No murmur heard. Pulmonary:     Effort: Pulmonary effort is normal.     Breath sounds: No wheezing, rhonchi or rales.  Musculoskeletal:     Cervical back: Neck supple.     Right lower leg: No edema.     Left lower leg: No edema.     Comments: Chest with diffuse tenderness, some bruising.  Skin:    General: Skin is warm and dry.  Neurological:     Mental Status: He is alert and oriented to person, place, and time.  Psychiatric:        Mood and Affect: Mood normal.        Behavior: Behavior normal.      Assessment & Plan:   See Encounters Tab for problem based charting.  Cardiac arrest Patient presents for hospital follow-up after recent admission for PEA arrest in the setting of HFrEF, history of ventricular tachycardia, left bundle branch block and high PVC burden, CAD, and cocaine use. ROSC after 8 minutes of CPR by EMS.  Patient was subsequently  intubated and admitted to the ICU.  Ultimately weaned, treated for possible aspiration pneumonia with Unasyn, and felt stable for discharge once without dyspnea and hemodynamically stable.  He was seen by heart failure while inpatient.  Discharged on amiodarone 200 mg, discontinued losartan and spironolactone and digoxin with plans to possibly restart at follow-up with them.  Today he presents without any symptoms aside from chest pain likely from recent CPR.  Reviewed x-ray and do not see any fractures though patient reports his tramadol is not fully addressing the pain.  We discussed that it should resolve in the next week or 2 given the trauma he sustained to his chest via compressions.  He should still have plenty of tramadol left and we discussed taking this sparingly.  Patient denies any recent palpitations or dizziness.  He states that functionally he is the same as before he went to the hospital, he is able to complete all of his daily tasks.  On exam he is satting well, normotensive, and largely well-appearing.  Heart and lung exam without abnormality.  No lower extremity edema.  Does have some tenderness about the chest.  Thankfully, seems to have good air movement.  Endorses good compliance with his  medications.  No signs of heart failure exacerbation. -Continue amiodarone, Coreg, torsemide, mexiletine and follow-up with cardiology next week for consideration of additional medications -Reports abstinence from cocaine since discharge -Repeat BMP  Patient discussed with Dr. Oswaldo Done

## 2022-11-29 NOTE — Assessment & Plan Note (Addendum)
Patient presents for hospital follow-up after recent admission for PEA arrest in the setting of HFrEF, history of ventricular tachycardia, left bundle branch block and high PVC burden, CAD, and cocaine use. ROSC after 8 minutes of CPR by EMS.  Patient was subsequently intubated and admitted to the ICU.  Ultimately weaned, treated for possible aspiration pneumonia with Unasyn, and felt stable for discharge once without dyspnea and hemodynamically stable.  He was seen by heart failure while inpatient.  Discharged on amiodarone 200 mg, discontinued losartan and spironolactone and digoxin with plans to possibly restart at follow-up with them.  Today he presents without any symptoms aside from chest pain likely from recent CPR.  Reviewed x-ray and do not see any fractures though patient reports his tramadol is not fully addressing the pain.  We discussed that it should resolve in the next week or 2 given the trauma he sustained to his chest via compressions.  He should still have plenty of tramadol left and we discussed taking this sparingly.  Patient denies any recent palpitations or dizziness.  He states that functionally he is the same as before he went to the hospital, he is able to complete all of his daily tasks.  On exam he is satting well, normotensive, and largely well-appearing.  Heart and lung exam without abnormality.  No lower extremity edema.  Does have some tenderness about the chest.  Thankfully, seems to have good air movement.  Endorses good compliance with his medications.  No signs of heart failure exacerbation. -Continue amiodarone, Coreg, torsemide, mexiletine and follow-up with cardiology next week for consideration of additional medications -Reports abstinence from cocaine since discharge -Repeat BMP

## 2022-11-30 LAB — BMP8+ANION GAP
Anion Gap: 16 mmol/L (ref 10.0–18.0)
BUN/Creatinine Ratio: 23 (ref 10–24)
BUN: 38 mg/dL — ABNORMAL HIGH (ref 8–27)
CO2: 20 mmol/L (ref 20–29)
Calcium: 9.4 mg/dL (ref 8.6–10.2)
Chloride: 96 mmol/L (ref 96–106)
Creatinine, Ser: 1.67 mg/dL — ABNORMAL HIGH (ref 0.76–1.27)
Glucose: 107 mg/dL — ABNORMAL HIGH (ref 70–99)
Potassium: 5.4 mmol/L — ABNORMAL HIGH (ref 3.5–5.2)
Sodium: 132 mmol/L — ABNORMAL LOW (ref 134–144)
eGFR: 44 mL/min/{1.73_m2} — ABNORMAL LOW (ref >59)

## 2022-12-02 ENCOUNTER — Other Ambulatory Visit (HOSPITAL_COMMUNITY): Payer: Self-pay

## 2022-12-02 MED ORDER — LOKELMA 10 G PO PACK
10.0000 g | PACK | Freq: Every day | ORAL | 0 refills | Status: DC
  Filled 2022-12-02: qty 10, 10d supply, fill #0

## 2022-12-02 MED ORDER — LOKELMA 10 G PO PACK: 10.0000 g | PACK | Freq: Every day | ORAL | 0 refills | Status: DC

## 2022-12-02 NOTE — Addendum Note (Signed)
Addended by: Adron Bene on: 12/02/2022 04:13 PM   Modules accepted: Orders

## 2022-12-02 NOTE — Addendum Note (Signed)
Addended by: Adron Bene on: 12/02/2022 04:24 PM   Modules accepted: Orders

## 2022-12-02 NOTE — Progress Notes (Signed)
Internal Medicine Clinic Attending  Case discussed with Dr. White  At the time of the visit.  We reviewed the resident's history and exam and pertinent patient test results.  I agree with the assessment, diagnosis, and plan of care documented in the resident's note.  

## 2022-12-03 ENCOUNTER — Telehealth: Payer: Self-pay

## 2022-12-03 MED ORDER — VELTASSA 8.4 G PO PACK: 8.4000 g | PACK | Freq: Every day | ORAL | 0 refills | Status: DC

## 2022-12-03 NOTE — Telephone Encounter (Addendum)
Decision:Denied Edward Mullen (Key: [id number]) Rx #: [phone] [phone] Lokelma 10GM packets Form [employer] Electronic PA Form  eAppeal Submitted eAppeal Determination Your prior authorization request has been denied. COMPLETE E-APPEAL Your request for prior authorization was denied, but an Electronic Appeal is available for your patient. Complete the questions in the Appeal section at the bottom of this page to pursue the appeal. For assistance, contact our support team at [phone].  Message from plan: The drug you asked for is not listed in your preferred drug list (formulary). The preferred drug(s), you may not have tried, are: sodium polystyrene sulfonate oral powder SPS (with sorbitol) oral suspension Veltassa oral powder packet Your provider needs to give [place] medical reasons why the preferred drug(s) would not work for you and/or would have bad side effects. Sometimes a preferred drug needs more review for approval. Additionally, some preferred drugs listed may be the same drugs with different strengths or forms. [employer] may only require one strength or form of that drug to be tried. This decision was from [facility] Non-Formulary Exceptions Coverage Policy.  More information has been placed in doctors box.

## 2022-12-03 NOTE — Addendum Note (Signed)
Addended by: Adron Bene on: 12/03/2022 12:59 PM   Modules accepted: Orders

## 2022-12-03 NOTE — Telephone Encounter (Signed)
Prior Authorization for patient Edward Mullen) came through on cover my meds was submitted with last office notes and labs awaiting approval or denial

## 2022-12-03 NOTE — Telephone Encounter (Signed)
Thank you  Lauren

## 2022-12-03 NOTE — Telephone Encounter (Signed)
Requesting to speak with a nurse about blister on the chest. Please call pt back.

## 2022-12-03 NOTE — Telephone Encounter (Signed)
Returned call to patient. States 2 day hx of painful blisters right side of chest, under right axilla, wrapping around to right flank. Rates pain 7/10 at present. States he tried neosporin but felt this made it worse. He has not been vaccinated for shingles. He already has appt tomorrow AM at 9:15. States he can wait till then.

## 2022-12-04 ENCOUNTER — Other Ambulatory Visit (HOSPITAL_COMMUNITY): Payer: Self-pay

## 2022-12-04 ENCOUNTER — Other Ambulatory Visit: Payer: Self-pay

## 2022-12-04 ENCOUNTER — Ambulatory Visit (INDEPENDENT_AMBULATORY_CARE_PROVIDER_SITE_OTHER): Payer: Medicare HMO | Admitting: Internal Medicine

## 2022-12-04 VITALS — BP 111/80 | HR 82 | Temp 97.7°F

## 2022-12-04 DIAGNOSIS — R0789 Other chest pain: Secondary | ICD-10-CM

## 2022-12-04 DIAGNOSIS — N1831 Chronic kidney disease, stage 3a: Secondary | ICD-10-CM

## 2022-12-04 DIAGNOSIS — B028 Zoster with other complications: Secondary | ICD-10-CM | POA: Diagnosis not present

## 2022-12-04 DIAGNOSIS — E8729 Other acidosis: Secondary | ICD-10-CM

## 2022-12-04 DIAGNOSIS — L308 Other specified dermatitis: Secondary | ICD-10-CM

## 2022-12-04 DIAGNOSIS — B029 Zoster without complications: Secondary | ICD-10-CM | POA: Diagnosis not present

## 2022-12-04 DIAGNOSIS — E875 Hyperkalemia: Secondary | ICD-10-CM

## 2022-12-04 MED ORDER — VALACYCLOVIR HCL 1 G PO TABS
1000.0000 mg | ORAL_TABLET | Freq: Two times a day (BID) | ORAL | 0 refills | Status: DC
  Filled 2022-12-04: qty 14, 7d supply, fill #0

## 2022-12-04 MED ORDER — GABAPENTIN 100 MG PO CAPS
100.0000 mg | ORAL_CAPSULE | Freq: Three times a day (TID) | ORAL | 0 refills | Status: DC
  Filled 2022-12-04: qty 90, 30d supply, fill #0

## 2022-12-04 MED ORDER — VALACYCLOVIR HCL 1 G PO TABS
1000.0000 mg | ORAL_TABLET | Freq: Two times a day (BID) | ORAL | 0 refills | Status: AC
  Filled 2022-12-04: qty 14, 7d supply, fill #0

## 2022-12-04 NOTE — Progress Notes (Unsigned)
CC: rash  HPI:[name] is a 67 y.o. male who presents for evaluation of rash. Please see individual problem based A/P for details.  59 yom with recent hospitalization and cardiac arrest.   Depression, PHQ-9: Based on the patients  Flowsheet Row Office Visit from 06/17/2022 in [facility]  PHQ-9 Total Score 0      score we have .  Past Medical History:  Diagnosis Date   Arthritis    "feels like it in my legs" (09/20/2014)   CAD (coronary artery disease)    Chronic kidney disease, stage 3a    Chronic systolic CHF (congestive heart failure)    Cocaine abuse    Hypertension    NICM (nonischemic cardiomyopathy)    NSVT (nonsustained ventricular tachycardia) 09/06/2016   pre diabetes    patient denies   Review of Systems:   See hpi  Physical Exam: Vitals:   12/04/22 0924  BP: 111/80  Pulse: 82  Temp: 97.7 F (36.5 C)  TempSrc: Oral  SpO2: 99%   General: nad HEENT: Conjunctiva nl , antiicteric sclerae, moist mucous membranes, no exudate or erythema Cardiovascular: Normal rate, regular rhythm.  No murmurs, rubs, or gallops Chest: reproducible pain with palpation Pulmonary : Equal breath sounds, No wheezes, rales, or rhonchi Abdominal: soft, nontender,  bowel sounds present Ext: No edema in lower extremities, no tenderness to palpation of lower extremities.  Skin:     Assessment & Plan:   See Encounters Tab for problem based charting.  Herpes zoster dermatitis Patient with new rash. Painful blisters right side of chest under axilla wrapping around to right back/shoulder. Painful 7/10. Has tried neosporin and hydrogen peroxide which made it feel worse. He has not been vaccinated for shingles. Blistering fluid filled vesicular rash in dermatomal pattern right side present with erythematous base. Ruptured and crusting lesions present on back. Picture present in media tab. Shingles rash likely brought on by stress of recent  hospitalization.  Treat with valacyclovir  BID  for 7 days (dose reduced for CKD).provided patient with Rx of gabapentin for neuritis. Also recommended Tylenol. Advised he avoid NSAIDs given his CKD. He will keep it covered and keep others away from him if it is exposed. We plan on having telehealth appointment in 1 week to assess if new blisters are still appearing and determine if extending duration of antiviral therapy is necessary.   Hyperkalemia Patient with mild hyperkalemia. He is prescribed valtessa.  He denies palpitations.  Potassium is only mildly elevated at this time and he appears to be asymptomatic. We will not adjust his valtessa dosing at present time.   Chest pain, musculoskeletal Patient continues to complain of chest pain. He has extensive cardiac history however current pain began after cardiac arrest and CPR was initiated during this past hospitalization. He describes feeling sore and achy on the left side since then. He was recently evaluated for a hospital follow up and complained of this same pain. He says that it has improved some since that time. He was controlling pain with Tramadol but became constipated so he stopped using it.  Pain is reproducible on palpation.  Likely MSK pain in the setting of recent chest compressions. Advised he may continue to use the tramadol for pain but he will need some form of laxative/stool softener. Recommended daily miralax while he is using tramadol.   Increased anion gap metabolic acidosis AG 19 with low bicarb on labwork. Will plan to repeat this lab hopefully  tomorrow for lab only visit. Patient does have a hx of DM and is on SGLT2i which could be creating a euglyceic DKA picture. He is at risk of DKA given active shingles. We will repeat this along with BHB and urine ketone.   Patient discussed with Dr.  Sol Blazing

## 2022-12-04 NOTE — Patient Instructions (Addendum)
Dear Mr. Vandermeulen,  Thank you for trusting [place] with your care.   We evaluated you for shingles. Please take the valacyclovir twice daily for 7 days. You may take the gabapentin up to 3 times daily to help with the pain. It can make you drowsy so best to try it first at night. We also recommend that you do not drive after taking a dose. You have kidney and heart disease so avoid NSAIDs. You may use tylenol. Keep the rash covered. If it is not covered, avoid exposing other people to it.   For your chest pain, the tramadol can help. It can make people constipated so please also take a laxative each day with it.   We would like to repeat your potassium lab to see if that has come down.  We will call you in 1 week.

## 2022-12-05 ENCOUNTER — Encounter: Payer: Self-pay | Admitting: Internal Medicine

## 2022-12-05 ENCOUNTER — Encounter (HOSPITAL_COMMUNITY): Payer: Self-pay

## 2022-12-05 ENCOUNTER — Ambulatory Visit (HOSPITAL_COMMUNITY)
Admit: 2022-12-05 | Discharge: 2022-12-05 | Disposition: A | Discharge status: Home / Self Care | Payer: Medicare HMO | Attending: Physician Assistant | Admitting: Physician Assistant

## 2022-12-05 VITALS — BP 90/60 | HR 82 | Wt 188.6 lb

## 2022-12-05 DIAGNOSIS — B029 Zoster without complications: Secondary | ICD-10-CM | POA: Insufficient documentation

## 2022-12-05 DIAGNOSIS — L308 Other specified dermatitis: Secondary | ICD-10-CM | POA: Insufficient documentation

## 2022-12-05 DIAGNOSIS — R0789 Other chest pain: Secondary | ICD-10-CM | POA: Insufficient documentation

## 2022-12-05 DIAGNOSIS — I5022 Chronic systolic (congestive) heart failure: Secondary | ICD-10-CM

## 2022-12-05 DIAGNOSIS — E875 Hyperkalemia: Secondary | ICD-10-CM | POA: Insufficient documentation

## 2022-12-05 DIAGNOSIS — E8729 Other acidosis: Secondary | ICD-10-CM | POA: Insufficient documentation

## 2022-12-05 LAB — BMP8+ANION GAP
Anion Gap: 19 mmol/L — ABNORMAL HIGH (ref 10.0–18.0)
BUN/Creatinine Ratio: 14 (ref 10–24)
BUN: 23 mg/dL (ref 8–27)
CO2: 18 mmol/L — ABNORMAL LOW (ref 20–29)
Calcium: 9.3 mg/dL (ref 8.6–10.2)
Chloride: 92 mmol/L — ABNORMAL LOW (ref 96–106)
Creatinine, Ser: 1.62 mg/dL — ABNORMAL HIGH (ref 0.76–1.27)
Glucose: 88 mg/dL (ref 70–99)
Potassium: 5.3 mmol/L — ABNORMAL HIGH (ref 3.5–5.2)
Sodium: 129 mmol/L — ABNORMAL LOW (ref 134–144)
eGFR: 46 mL/min/{1.73_m2} — ABNORMAL LOW (ref >59)

## 2022-12-05 NOTE — Assessment & Plan Note (Signed)
Patient with mild hyperkalemia. He is prescribed valtessa.  He denies palpitations.  Potassium is only mildly elevated at this time and he appears to be asymptomatic. We will not adjust his valtessa dosing at present time.

## 2022-12-05 NOTE — Assessment & Plan Note (Signed)
Patient continues to complain of chest pain. He has extensive cardiac history however current pain began after cardiac arrest and CPR was initiated during this past hospitalization. He describes feeling sore and achy on the left side since then. He was recently evaluated for a hospital follow up and complained of this same pain. He says that it has improved some since that time. He was controlling pain with Tramadol but became constipated so he stopped using it.  Pain is reproducible on palpation.  Likely MSK pain in the setting of recent chest compressions. Advised he may continue to use the tramadol for pain but he will need some form of laxative/stool softener. Recommended daily miralax while he is using tramadol.

## 2022-12-05 NOTE — Assessment & Plan Note (Addendum)
AG 19 with low bicarb on labwork. Will plan to repeat this lab hopefully tomorrow for lab only visit. Patient does have a hx of DM and is on SGLT2i which could be creating a euglyceic DKA picture. He is at risk of DKA given active shingles. We will repeat this along with BHB and urine ketone.  I have called and discussed this with the patient. He understands and will try to come for lab only visit tomorrow.

## 2022-12-05 NOTE — Assessment & Plan Note (Addendum)
Patient with new rash. Painful blisters right side of chest under axilla wrapping around to right back/shoulder. Painful 7/10. Has tried neosporin and hydrogen peroxide which made it feel worse. He has not been vaccinated for shingles. Blistering fluid filled vesicular rash in dermatomal pattern right side present with erythematous base. Ruptured and crusting lesions present on back. Picture present in media tab. Shingles rash likely brought on by stress of recent hospitalization.  Treat with valacyclovir  BID  for 7 days (dose reduced for CKD).provided patient with Rx of gabapentin for neuritis. Also recommended Tylenol. Advised he avoid NSAIDs given his CKD. He will keep it covered and keep others away from him if it is exposed. We plan on having telehealth appointment in 1 week to assess if new blisters are still appearing and determine if extending duration of antiviral therapy is necessary.

## 2022-12-05 NOTE — Addendum Note (Signed)
Addended by: Adron Bene T on: 12/05/2022 03:54 PM   Modules accepted: Orders

## 2022-12-05 NOTE — Addendum Note (Signed)
Addended by: Adron Bene T on: 12/05/2022 04:56 PM   Modules accepted: Orders

## 2022-12-06 ENCOUNTER — Other Ambulatory Visit: Payer: Medicare HMO

## 2022-12-06 ENCOUNTER — Telehealth: Payer: Self-pay | Admitting: Internal Medicine

## 2022-12-06 LAB — VARICELLA-ZOSTER BY PCR: Varicella-Zoster, PCR: POSITIVE — AB

## 2022-12-06 NOTE — Telephone Encounter (Addendum)
Called patient regarding follow up labs. He tells me he forgot about it. Feels fine at this time. Clinic closed for the weekend. We will get him scheduled for lab only visit Monday. Discussed with Dr. Mayford Knife.

## 2022-12-09 NOTE — Progress Notes (Signed)
Internal Medicine Clinic Attending  Case discussed with Dr. Burnice Logan  At the time of the visit.  We reviewed the resident's history and exam and pertinent patient test results.  I agree with the assessment, diagnosis, and plan of care documented in the resident's note. Message sent to Dr. Burnice Logan to confirm f/u appointment this week with Mr. Gwaltney, including repeat lab check. Patient had not yet picked up Valtessa at last visit, will need to assess efficacy given persistent mild hyperkalemia. Unclear etiology of further lab changes including elevated AG and low bicarb. Patient is on SGLT2i with recent physiologic stress of zoster infection. Repeat labs pending.

## 2022-12-09 NOTE — Discharge Summary (Signed)
Physician Discharge Summary  FREDI GEILER ONG:[id number] DOB: 1955-09-11 DOA: 11/17/2022  PCP: Morene Crocker, MD  Admit date: 11/17/2022 Discharge date: 11/26/2022  Time spent: 35 minutes  Recommendations for Outpatient Follow-up:  Abnormal imaging -heterogenous prostate gland and right adrenal nodule needs follow-up Advanced heart failure clinic follow-up made   Discharge Diagnoses:  Principal Problem:   Cardiac arrest [facility]) Active Problems: Sternal fracture Multiple rib fractures   Chronic systolic heart failure (HCC)   VT (ventricular tachycardia) (HCC)   Cardiogenic shock (HCC)   Malnutrition of moderate degree (HCC)   Abnormal echocardiogram   Discharge Condition: Improved  Diet recommendation: Low-sodium, heart healthy  Filed Weights   11/24/22 0339 11/25/22 0406 11/26/22 0521  Weight: 84.9 kg 85 kg 86 kg    History of present illness:  66/M with history of chronic systolic CHF, NICM, CAD, type 2 diabetes mellitus, hypertension, LBBB, history of BiV ICD, polysubstance abuse, CKD 3 A was admitted to the hospital after an out of hospital PEA arrest, found unresponsive in a parking lot with agonal respirations and pinpoint pupils, noted to be in PEA arrest, CPR was initiated, achieved ROSC in 8 minutes, intubated in the ER, subsequently became hypotensive, had copious amounts of frothy bloody sputum from ET tube, started on norepinephrine followed by epinephrine infusion and admitted to the ICU, labs noted creatinine of 2.0, potassium 6.1, UDS was positive for cocaine benzo and THC. -Respiratory cultures with few gram-positive cocci in pairs and clusters, on 4/8 had VT overnight, positive rhinovirus on respiratory virus panel. -4/8, 2D echo with EF 20-25% moderately reduced RV, hazy mobile density on aortic valve cusp -4/10: Extubated  -4/12: Transferred to [facility] service 4/13: given IV lasix, continues to have chest pain and Dyspnea  Hospital Course:   PEA  arrest -ROSC in -Echo 4/8 with EF 20-25%.  Moderately reduced RV hazy mobile density on aortic valve  -UDS +cocaine, BZDs, THC.  -EP interrogated PPM with no events recorded.  -Cards following -Suspected to be combination of hypoxic event + cocaine use. -Improved and stable, discharged home in a stable condition -Continue mexiletine for NSVT   Cardiogenic shock status postcardiac arrest Chronic systolic CHF, - NSVT, prolonged QTc -known nonischemic cardiomyopathy with EF 20-25%, -CT on admission noted bilateral groundglass opacities consistent with pulmonary edema versus contusions and multiple rib fractures -Weaned off pressors  -Diuresed with IV Lasix, now euvolemic, transitioned to Jardiance, Aldactone, torsemide, Coreg  -Follow-up in the heart failure clinic   Chest wall pain -Has a distal sternal fracture and multiple rib fractures -Pain control, continue lidocaine patch, encouraged incentive spirometry and flutter valve   Mobile density on aortic valve -Reported on echo 4/8,?  Thrombus, clinically do not suspect endocarditis -Reviewed with cards 4/12, they do not suspect endocarditis, will need repeat echo down the road   Acute hypoxic respiratory failure  History of COPD -Multifactorial from pulmonary edema and rhinovirus infection, also treated for aspiration pneumonia in ICU - extubated 4/10, - failed SLP 4/10.  Passed 4/11, now on dysphagia 3 diet  -Completed 5 days of antibiotics  - cont BD, Brovana/Yupelri, Pulmicort -Improved, weaned off O2  Acute metabolic and possibly toxic encephalopathy.  - EEG 4/8 with severe diffuse encephalopathy, no seizures.CT Head NAICA, parenchymal atrophy/chronic small vessel disease. - resolved.  Cont supportive care   Acute on chronic renal failure.  - Baseline CKD stage IIIa Hyperkalemia, resolved  -Mild uptrend with diuresis, holding torsemide today   Enlarged prostate CT A/P 4/7 with  markedly enlarged prostate gland  protruding into bladder base. - PSA 2.32 (stable from 1 year prior) - Outpatient f/u - voiding w/o difficulty   Polysubstance abuse UDS + cocaine, BZD, THC. -Counseled   Thrombocytopenia -Mild, monitor  Discharge Exam: Vitals:   11/26/22 0735 11/26/22 0748  BP:  118/66  Pulse:  (!) 38  Resp:  17  Temp:  97.8 F (36.6 C)  SpO2: 96% 100%   Gen: Awake, Alert, Oriented X 2, mild cognitive deficits HEENT: no JVD Lungs: Good air movement bilaterally, CTAB, chest wall tenderness CVS: S1S2/RRR Abd: soft, Non tender, non distended, BS present Extremities: No edema Skin: no new rashes on exposed skin   Discharge Instructions   Discharge Instructions     Diet - low sodium heart healthy   Complete by: As directed    Increase activity slowly   Complete by: As directed       Allergies as of 11/26/2022   No Known Allergies      Medication List     STOP taking these medications    digoxin 0.125 MG tablet Commonly known as: LANOXIN   spironolactone 25 MG tablet Commonly known as: ALDACTONE       TAKE these medications    albuterol (2.5 MG/3ML) 0.083% nebulizer solution Commonly known as: PROVENTIL USE ONE VIAL (2.5 MG TOTAL) BY NEBULIZATION EVERY 6 (SIX) HOURS AS NEEDED FOR SHORTNESS OF BREATH OR WHEEZING.   albuterol 108 (90 Base) MCG/ACT inhaler Commonly known as: VENTOLIN HFA INHALE 1 TO 2 PUFFS BY MOUTH EVERY 6 HOURS AS NEEDED FOR WHEEZING OR SHORTNESS OF BREATH   amiodarone 200 MG tablet Commonly known as: PACERONE Take 1 tablet (200 mg total) by mouth daily. What changed: See the new instructions.   aspirin EC 81 MG tablet Take 1 tablet (81 mg total) by mouth daily. Swallow whole.   bismuth subsalicylate 262 MG/15ML suspension Commonly known as: PEPTO BISMOL Take 30 mLs by mouth every 6 (six) hours as needed for indigestion.   Breo Ellipta 100-25 MCG/ACT Aepb Generic drug: fluticasone furoate-vilanterol Inhale 1 puff into the lungs daily.    carvedilol 3.125 MG tablet Commonly known as: Coreg Take 1 tablet (3.125 mg total) by mouth 2 (two) times daily.   empagliflozin 10 MG Tabs tablet Commonly known as: Jardiance Take 1 tablet (10 mg total) by mouth daily. What changed: See the new instructions.   Incruse Ellipta 62.5 MCG/ACT Aepb Generic drug: umeclidinium bromide Inhale 1 puff into the lungs daily.   mexiletine 150 MG capsule Commonly known as: MEXITIL Take 1 capsule (150 mg total) by mouth 2 (two) times daily.   rosuvastatin 5 MG tablet Commonly known as: CRESTOR TAKE 1 TABLET (5 MG TOTAL) BY MOUTH DAILY.   tamsulosin 0.4 MG Caps capsule Commonly known as: FLOMAX TAKE ONE CAPSULE BY MOUTH ONCE DAILY AFTER SUPPER   torsemide 20 MG tablet Commonly known as: DEMADEX Take 1 tablet (20 mg total) by mouth daily. What changed:  when to take this reasons to take this   traMADol 50 MG tablet Commonly known as: ULTRAM Take 1 tablet (50 mg total) by mouth every 6 (six) hours as needed for moderate pain (chest pain from rib fractures).   Vitamin D3 50 MCG (2000 UT) Tabs Take 1 tablet by mouth daily.       No Known Allergies  Follow-up Information     [facility] Follow up on 12/05/2022.   Specialty: Cardiology Why:  Follow up in the Advanced Heart Failure Clinic 12/05/22 at [address], free valet Contact information: [address] [id number] [place] [address] [phone]        [facility] Follow up.   Specialty: Home Health Services Why: Home health RN, and Physical Therapy- agency will call to arrange appt Contact information: [address] [place] [address] [phone]         Morene Crocker, MD Follow up in 3 day(s).   Specialty: Student Why: @9 :15am Contact information: [address] [place] [address] [phone]                  The results of significant  diagnostics from this hospitalization (including imaging, microbiology, ancillary and laboratory) are listed below for reference.    Significant Diagnostic Studies: DG CHEST PORT 1 VIEW  Result Date: 11/24/2022 CLINICAL DATA:  Dyspnea, cardiac arrest EXAM: PORTABLE CHEST 1 VIEW COMPARISON:  Chest x-ray November 19, 2022 FINDINGS: Interval extubation and removal of the NG tube. Intact left chest wall 3 lead AICD. The cardiomediastinal silhouette is unchanged in contour. No focal pulmonary opacity. No pleural effusion or pneumothorax. The visualized upper abdomen is unremarkable. No displaced rib fractures. IMPRESSION: 1. No acute pulmonary abnormality. 2. Unchanged cardiomegaly. Electronically Signed   By: Jacob Moores M.D.   On: 11/24/2022 12:01   [place] EKG SITE RITE  Result Date: 11/20/2022 If Site Rite image not attached, placement could not be confirmed due to current cardiac rhythm.  DG Chest Port 1 View  Result Date: 11/19/2022 CLINICAL DATA:  Intubated EXAM: PORTABLE CHEST 1 VIEW COMPARISON:  None Available. FINDINGS: Enlarged cardiac silhouette. Layering and left-sided large pleural effusion. No pneumothorax. Normal pulmonary vasculature. Calcified aorta.Left-sided pacer. Endotracheal tube tip 3 cm above carina. NG tube tip superimposed with stomach below the diaphragm. IMPRESSION: Enlarged cardiac silhouette.  Large left-sided pleural effusion. Electronically Signed   By: Layla Maw M.D.   On: 11/19/2022 08:20   EEG adult  Result Date: 11/18/2022 Charlsie Quest, MD     11/18/2022 11:26 AM Patient Name: Edward Mullen MRN: [id number] Epilepsy Attending: Charlsie Quest Referring Physician/Provider: Simonne Martinet, NP Date: 11/18/2022 Duration: 22.16 mins Patient history: 67yo M s/p cardiac arrest. EEG to evaluate for seizure Level of alertness: comatose AEDs during EEG study: Propofol Technical aspects: This EEG study was done with scalp electrodes positioned according to the 10-20  International system of electrode placement. Electrical activity was reviewed with band pass filter of 1-70Hz , sensitivity of 7 uV/mm, display speed of 37mm/sec with a 60Hz  notched filter applied as appropriate. EEG data were recorded continuously and digitally stored.  Video monitoring was available and reviewed as appropriate. Description: EEG showed continuous generalized 3 to 6 Hz theta-delta slowing with overriding 15 to 18 Hz beta activity distributed symmetrically and diffusely.  Hyperventilation and photic stimulation were not performed.   ABNORMALITY - Continuous slow, generalized - Excessive beta, generalized IMPRESSION: This study is suggestive of severe diffuse encephalopathy likely related to sedation. No seizures or epileptiform discharges were seen throughout the recording. Charlsie Quest   ECHOCARDIOGRAM COMPLETE  Result Date: 11/18/2022    ECHOCARDIOGRAM REPORT   Patient Name:   GURINDER TORAL Date of Exam: 11/18/2022 Medical Rec #:  [id number]         Height:       72.0 in Accession #:    [id number]        Weight:  198.4 lb Date of Birth:  29-Aug-1955        BSA:          2.123 m Patient Age:    40 years          BP:           116/67 mmHg Patient Gender: M                 HR:           66 bpm. Exam Location:  Inpatient Procedure: 2D Echo, Cardiac Doppler, Color Doppler and Intracardiac            Opacification Agent Indications:    Cardiac arrest  History:        Patient has prior history of Echocardiogram examinations, most                 recent 06/07/2022. Cardiomyopathy and CHF, CAD, Arrythmias:NSVT;                 Risk Factors:Hypertension. Cocaine abuse, CKD.  Sonographer:    Milda Smart Referring Phys: [id number] PETER E BABCOCK  Sonographer Comments: Echo performed with patient supine and on artificial respirator and Technically difficult study due to poor echo windows. Image acquisition challenging due to patient body habitus and Image acquisition challenging due to respiratory   motion. Low parasternals. IMPRESSIONS  1. Left ventricular ejection fraction, by estimation, is 25 to 30%. The left ventricle has severely decreased function. The left ventricle demonstrates global hypokinesis. The left ventricular internal cavity size was severely dilated. There is mild concentric left ventricular hypertrophy. Left ventricular diastolic parameters are consistent with Grade I diastolic dysfunction (impaired relaxation).  2. Right ventricular systolic function is normal. The right ventricular size is normal.  3. The mitral valve is normal in structure. Trivial mitral valve regurgitation. No evidence of mitral stenosis.  4. There is a hazy mobile density on the aortic side of the aorti valve cusp seen in the PSAX view at the base of the cusp. Cannot rule out vegetation. The aortic valve is tricuspid in structure. Aortic valve regurgitation is mild to moderate. Aortic valve sclerosis/calcification is present, without any evidence of aortic stenosis. Aortic regurgitation PHT measures 672 msec. Aortic valve area, by VTI measures 1.76 cm. Aortic valve mean gradient measures 12.0 mmHg. Aortic valve Vmax measures 2.04 m/s.  5. Aortic dilatation noted. There is moderate dilatation of the ascending aorta, measuring 46 mm.  6. The inferior vena cava is normal in size with greater than 50% respiratory variability, suggesting right atrial pressure of 3 mmHg. Conclusion(s)/Recommendation(s): Findings concerning for Aortic valve vegetation, would recommend Transesophageal Echocardiogram for clarification. FINDINGS  Left Ventricle: Left ventricular ejection fraction, by estimation, is 25 to 30%. The left ventricle has severely decreased function. The left ventricle demonstrates global hypokinesis. Definity contrast agent was given IV to delineate the left ventricular endocardial borders. The left ventricular internal cavity size was severely dilated. There is mild concentric left ventricular hypertrophy. Abnormal  (paradoxical) septal motion, consistent with RV pacemaker. Left ventricular diastolic parameters are consistent with Grade I diastolic dysfunction (impaired relaxation). Normal left ventricular filling pressure. Right Ventricle: The right ventricular size is normal. No increase in right ventricular wall thickness. Right ventricular systolic function is normal. Left Atrium: Left atrial size was normal in size. Right Atrium: Right atrial size was normal in size. Pericardium: There is no evidence of pericardial effusion. Mitral Valve: The mitral valve is normal in structure. Trivial mitral valve regurgitation.  No evidence of mitral valve stenosis. MV peak gradient, 3.4 mmHg. The mean mitral valve gradient is 1.0 mmHg. Tricuspid Valve: The tricuspid valve is normal in structure. Tricuspid valve regurgitation is not demonstrated. No evidence of tricuspid stenosis. Aortic Valve: There is a hazy mobile density on the aortic side of the aorti valve cusp seen in the PSAX view at the base of the cusp. Cannot rule out vegetation. The aortic valve is tricuspid. Aortic valve regurgitation is mild to moderate. Aortic regurgitation PHT measures 672 msec. Aortic valve sclerosis/calcification is present, without any evidence of aortic stenosis. Aortic valve mean gradient measures 12.0 mmHg. Aortic valve peak gradient measures 16.6 mmHg. Aortic valve area, by VTI measures 1.76 cm. Pulmonic Valve: The pulmonic valve was normal in structure. Pulmonic valve regurgitation is not visualized. No evidence of pulmonic stenosis. Aorta: Aortic dilatation noted. There is moderate dilatation of the ascending aorta, measuring 46 mm. Venous: The inferior vena cava is normal in size with greater than 50% respiratory variability, suggesting right atrial pressure of 3 mmHg. IAS/Shunts: No atrial level shunt detected by color flow Doppler. Additional Comments: A device lead is visualized.  LEFT VENTRICLE PLAX 2D LVIDd:         6.70 cm   Diastology  LVIDs:         5.60 cm   LV e' medial:    3.99 cm/s LV PW:         1.70 cm   LV E/e' medial:  12.2 LV IVS:        1.20 cm   LV e' lateral:   3.71 cm/s LVOT diam:     2.00 cm   LV E/e' lateral: 13.1 LV SV:         57 LV SV Index:   27 LVOT Area:     3.14 cm  RIGHT VENTRICLE             IVC RV S prime:     11.20 cm/s  IVC diam: 2.20 cm TAPSE (M-mode): 2.7 cm LEFT ATRIUM             Index        RIGHT ATRIUM           Index LA diam:        3.40 cm 1.60 cm/m   RA Area:     16.80 cm LA Vol (A2C):   40.2 ml 18.93 ml/m  RA Volume:   44.90 ml  21.15 ml/m LA Vol (A4C):   19.0 ml 8.95 ml/m LA Biplane Vol: 29.7 ml 13.99 ml/m  AORTIC VALVE AV Area (Vmax):    1.97 cm AV Area (Vmean):   1.71 cm AV Area (VTI):     1.76 cm AV Vmax:           204.00 cm/s AV Vmean:          166.000 cm/s AV VTI:            0.326 m AV Peak Grad:      16.6 mmHg AV Mean Grad:      12.0 mmHg LVOT Vmax:         128.00 cm/s LVOT Vmean:        90.300 cm/s LVOT VTI:          0.183 m LVOT/AV VTI ratio: 0.56 AI PHT:            672 msec  AORTA Ao Root diam: 3.80 cm Ao Asc diam:  4.60 cm MITRAL  VALVE MV Area (PHT): 2.51 cm    SHUNTS MV Area VTI:   1.87 cm    Systemic VTI:  0.18 m MV Peak grad:  3.4 mmHg    Systemic Diam: 2.00 cm MV Mean grad:  1.0 mmHg MV Vmax:       0.92 m/s MV Vmean:      58.5 cm/s MV Decel Time: 302 msec MR Peak grad: 35.8 mmHg MR Vmax:      299.00 cm/s MV E velocity: 48.70 cm/s MV A velocity: 89.10 cm/s MV E/A ratio:  0.55 Armanda Magic MD Electronically signed by Armanda Magic MD Signature Date/Time: 11/18/2022/10:15:35 AM    Final    DG Chest 1 View  Result Date: 11/18/2022 CLINICAL DATA:  Respiratory failure. EXAM: CHEST  1 VIEW COMPARISON:  11/17/2022 FINDINGS: ETT tip is stable above the carina. Enteric tube tip courses below the GE junction. Left chest wall ICD noted with leads in the right atrial appendage, coronary sinus and right ventricle. Cardiomediastinal contours are unremarkable. Bilateral interstitial and airspace  have improved when compared with the previous exam. No new findings. IMPRESSION: Previous bilateral interstitial and airspace opacities have improved in the interval. Electronically Signed   By: Signa Kell M.D.   On: 11/18/2022 06:33   CT CHEST ABDOMEN PELVIS W CONTRAST  Result Date: 11/17/2022 CLINICAL DATA:  Status post cardiac arrest.  Unresponsive. EXAM: CT CHEST, ABDOMEN, AND PELVIS WITH CONTRAST TECHNIQUE: Multidetector CT imaging of the chest, abdomen and pelvis was performed following the standard protocol during bolus administration of intravenous contrast. RADIATION DOSE REDUCTION: This exam was performed according to the departmental dose-optimization program which includes automated exposure control, adjustment of the mA and/or kV according to patient size and/or use of iterative reconstruction technique. CONTRAST:  75mL OMNIPAQUE IOHEXOL 350 MG/ML SOLN COMPARISON:  Portable chest obtained earlier today. Abdomen and pelvis CT dated 06/11/2022. Chest CT dated 10/05/2021. FINDINGS: CT CHEST FINDINGS Cardiovascular: Progressive enlargement of the heart. Atheromatous calcifications, including the coronary arteries and aorta. Intracardiac pacemaker leads and epicardial lead. No aortic aneurysm, dissection or gross pulmonary arterial filling defects. Mediastinum/Nodes: Nasogastric tube extending into the stomach. Endotracheal tube its tip 2.7 cm above the carina. No enlarged lymph nodes. Unremarkable thyroid gland. Lungs/Pleura: Extensive patchy and nodular airspace opacities in both lungs as well as extensive ground-glass interstitial opacities. Minimal bilateral pleural fluid. No pneumothorax. Mild bullous changes in the medial right upper lobe. Musculoskeletal: Left anterior 3rd, 4th and 5th rib fractures without significant displacement. Right 2nd, 3rd and 7th anterior rib fractures without significant displacement. Possible minimally angulated right 4th, 5th and 6th rib fractures without visible  fracture lines. Similar deformity involving the left anterior 6th rib. Fracture of the anterior cortex of the distal sternum. Thoracic and lower cervical spine degenerative changes including changes of DISH. CT ABDOMEN PELVIS FINDINGS Hepatobiliary: No focal liver abnormality is seen. No gallstones, gallbladder wall thickening, or biliary dilatation. Pancreas: Unremarkable. No pancreatic ductal dilatation or surrounding inflammatory changes. Spleen: Normal in size without focal abnormality. Adrenals/Urinary Tract: 3 mm mid to lower right renal calculus. 1.2 cm heterogeneous low and medium density right adrenal nodule without significant change since 06/11/2022 previously shown represent a benign adenoma. Unremarkable left adrenal gland, left kidney and ureters. Foley catheter in the urinary bladder with a small amount of associated air and no significant urine in the bladder. Markedly enlarged prostate gland protruding into the bladder base. Stomach/Bowel: Nasogastric tube tip in the lateral aspect of the body of the stomach  on the left. Mild colonic diverticulosis without evidence of diverticulitis. Normal-appearing appendix. Unremarkable small bowel. Vascular/Lymphatic: Atheromatous arterial calcifications without aneurysm. No enlarged lymph nodes. Reproductive: Markedly enlarged, heterogeneous prostate gland protruding into the bladder base. Other: No free peritoneal fluid or air. Musculoskeletal: Mild bilateral hip degenerative changes. Interbody and pedicle screw and rod fixation at the L3 through S1 levels normal alignment. Mild degenerative changes at the L2-3 level. IMPRESSION: 1. Extensive patchy and nodular airspace opacities in both lungs as well as extensive ground-glass interstitial opacities. This could be due to pulmonary edema, pneumonia and/or pulmonary contusions. 2. Minimal bilateral pleural fluid. 3. Progressive cardiomegaly. 4. Multiple bilateral anterior rib fractures without significant  displacement. 5. Fracture of the anterior cortex of the distal sternum. 6. No acute abnormality in the abdomen or pelvis. 7. 3 mm nonobstructing right renal calculus. 8. Markedly enlarged, heterogeneous prostate gland protruding into the bladder base. 9. Mild colonic diverticulosis. 10. Calcific coronary artery and aortic atherosclerosis. 11. 1.2 cm heterogeneous low and medium density right adrenal nodule without significant change since 06/11/2022 previously shown to represent a benign adenoma. This does not need imaging follow-up. Aortic Atherosclerosis (ICD10-I70.0). Electronically Signed   By: Beckie Salts M.D.   On: 11/17/2022 16:38   CT Head Wo Contrast  Result Date: 11/17/2022 CLINICAL DATA:  Provided history: Delirium. Post arrest, unresponsive. EXAM: CT HEAD WITHOUT CONTRAST TECHNIQUE: Contiguous axial images were obtained from the base of the skull through the vertex without intravenous contrast. RADIATION DOSE REDUCTION: This exam was performed according to the departmental dose-optimization program which includes automated exposure control, adjustment of the mA and/or kV according to patient size and/or use of iterative reconstruction technique. COMPARISON:  Prior head CT examinations 06/10/2019 and earlier. FINDINGS: Brain: Redemonstrated chronic lacunar infarcts within the right basal ganglia and left thalamocapsular junction. Background moderate patchy and ill-defined hypoattenuation within the cerebral white matter, nonspecific but compatible with chronic small-vessel ischemic disease. There is no acute intracranial hemorrhage. No demarcated cortical infarct. No extra-axial fluid collection. No evidence of an intracranial mass. No midline shift. Vascular: No hyperdense vessel.  Atherosclerotic calcifications. Skull: No fracture or aggressive osseous lesion. Sinuses/Orbits: No mass or acute finding within the imaged orbits. Mild mucosal thickening within the right maxillary sinus. 12 mm mucous  retention cyst, and minimal background mucosal thickening, within the left maxillary sinus. Mild mucosal thickening, and small-volume fluid, scattered within bilateral ethmoid air cells. Trace mucosal thickening within the right frontal sinus. Other: Partially imaged life-support tubes. IMPRESSION: 1. No CT evidence of an acute intracranial abnormality. Please note, a brain MRI would have greater sensitivity for acute hypoxic/ischemic injury. 2. Parenchymal atrophy and chronic small vessel ischemic disease, as described. 3. Paranasal sinus disease, as detailed. Electronically Signed   By: Jackey Loge D.O.   On: 11/17/2022 16:36   DG Chest Portable 1 View  Result Date: 11/17/2022 CLINICAL DATA:  Intubation EXAM: PORTABLE CHEST 1 VIEW COMPARISON:  07/17/2022 FINDINGS: Endotracheal tube terminates approximately 4.3 cm above the carina. Enteric tube courses below the diaphragm with distal tip beyond the inferior margin of the film. Cardiomegaly. Diffuse bilateral interstitial and alveolar airspace opacities, left slightly worse than right. No large pleural fluid collection. No pneumothorax. Left-sided implanted cardiac device remains in place. Overlying cardiac leads. IMPRESSION: 1. Endotracheal tube terminates approximately 4.3 cm above the carina. 2. Diffuse bilateral interstitial and alveolar airspace opacities, left slightly worse than right. Electronically Signed   By: Duanne Guess D.O.   On: 11/17/2022 14:39  Microbiology: Recent Results (from the past 240 hour(s))  Varicella-zoster by PCR     Status: Abnormal   Collection Time: 12/04/22  9:30 AM   Specimen: Blood   BLD  Result Value Ref Range Status   Varicella-Zoster, PCR Positive (A) Negative Final    Comment: Varicella Zoster Virus DNA detected. This test was developed and its performance characteristics determined by [facility].  It has not been cleared or approved by the Food and Drug Administration.  The FDA has determined that such  clearance or approval is not necessary.      Labs: Basic Metabolic Panel: Recent Labs  Lab 12/04/22 1005  NA 129*  K 5.3*  CL 92*  CO2 18*  GLUCOSE 88  BUN 23  CREATININE 1.62*  CALCIUM 9.3   Liver Function Tests: No results for input(s): "AST", "ALT", "ALKPHOS", "BILITOT", "PROT", "ALBUMIN" in the last 168 hours. No results for input(s): "LIPASE", "AMYLASE" in the last 168 hours. No results for input(s): "AMMONIA" in the last 168 hours. CBC: No results for input(s): "WBC", "NEUTROABS", "HGB", "HCT", "MCV", "PLT" in the last 168 hours. Cardiac Enzymes: No results for input(s): "CKTOTAL", "CKMB", "CKMBINDEX", "TROPONINI" in the last 168 hours. BNP: BNP (last 3 results) Recent Labs    04/04/22 1202 06/07/22 0142  BNP 212.5* 2,548.2*    ProBNP (last 3 results) No results for input(s): "PROBNP" in the last 8760 hours.  CBG: No results for input(s): "GLUCAP" in the last 168 hours.     Signed:  Zannie Cove MD.  [facility] 12/09/2022, 3:09 PM

## 2022-12-11 ENCOUNTER — Telehealth: Payer: Self-pay | Admitting: Internal Medicine

## 2022-12-11 NOTE — Telephone Encounter (Signed)
Patient missed his lab visit Friday. I called him at that time and told him we could reschedule for Monday. Patient subsequently missed that appointment too. Attempted to call last evening but no answer. Called again this morning. Patient acknowledged missing lab visit. York Spaniel he would try to get a ride and come for lab draw today.

## 2022-12-12 ENCOUNTER — Telehealth: Payer: Self-pay | Admitting: Internal Medicine

## 2022-12-12 NOTE — Telephone Encounter (Signed)
Unable to reach Edward Mullen by telephone today to discuss need for repeat labs and f/u visit. We will continue to try and reach him.

## 2022-12-13 ENCOUNTER — Other Ambulatory Visit: Payer: Self-pay | Admitting: Student

## 2022-12-13 DIAGNOSIS — E875 Hyperkalemia: Secondary | ICD-10-CM

## 2022-12-16 ENCOUNTER — Other Ambulatory Visit: Payer: Medicare HMO

## 2022-12-16 ENCOUNTER — Encounter: Payer: Self-pay | Admitting: Student

## 2022-12-16 ENCOUNTER — Ambulatory Visit (INDEPENDENT_AMBULATORY_CARE_PROVIDER_SITE_OTHER): Payer: Medicare HMO | Admitting: Student

## 2022-12-16 VITALS — BP 110/75 | HR 61 | Temp 97.8°F | Wt 190.1 lb

## 2022-12-16 DIAGNOSIS — I10 Essential (primary) hypertension: Secondary | ICD-10-CM

## 2022-12-16 DIAGNOSIS — B028 Zoster with other complications: Secondary | ICD-10-CM

## 2022-12-16 DIAGNOSIS — L308 Other specified dermatitis: Secondary | ICD-10-CM

## 2022-12-16 DIAGNOSIS — E875 Hyperkalemia: Secondary | ICD-10-CM

## 2022-12-16 DIAGNOSIS — N1832 Chronic kidney disease, stage 3b: Secondary | ICD-10-CM | POA: Diagnosis not present

## 2022-12-16 DIAGNOSIS — Z7984 Long term (current) use of oral hypoglycemic drugs: Secondary | ICD-10-CM

## 2022-12-16 DIAGNOSIS — E1122 Type 2 diabetes mellitus with diabetic chronic kidney disease: Secondary | ICD-10-CM

## 2022-12-16 DIAGNOSIS — I129 Hypertensive chronic kidney disease with stage 1 through stage 4 chronic kidney disease, or unspecified chronic kidney disease: Secondary | ICD-10-CM | POA: Diagnosis not present

## 2022-12-16 DIAGNOSIS — N183 Chronic kidney disease, stage 3 unspecified: Secondary | ICD-10-CM

## 2022-12-16 DIAGNOSIS — E8729 Other acidosis: Secondary | ICD-10-CM

## 2022-12-16 LAB — BASIC METABOLIC PANEL
Anion gap: 9 (ref 5–15)
BUN: 16 mg/dL (ref 8–23)
CO2: 22 mmol/L (ref 22–32)
Calcium: 9.1 mg/dL (ref 8.9–10.3)
Chloride: 103 mmol/L (ref 98–111)
Creatinine, Ser: 1.17 mg/dL (ref 0.61–1.24)
GFR, Estimated: >60 mL/min (ref >60)
Glucose, Bld: 80 mg/dL (ref 70–99)
Potassium: 3.9 mmol/L (ref 3.5–5.1)
Sodium: 134 mmol/L — ABNORMAL LOW (ref 135–145)

## 2022-12-16 LAB — BETA-HYDROXYBUTYRIC ACID: Beta-Hydroxybutyric Acid: 0.13 mmol/L (ref 0.05–0.27)

## 2022-12-16 MED ORDER — OXYCODONE HCL 5 MG PO TABS
5.0000 mg | ORAL_TABLET | ORAL | 0 refills | Status: AC | PRN
Start: 2022-12-16 — End: 2022-12-21

## 2022-12-16 MED ORDER — GABAPENTIN 100 MG PO CAPS
200.0000 mg | ORAL_CAPSULE | Freq: Three times a day (TID) | ORAL | 0 refills | Status: DC
Start: 2022-12-16 — End: 2023-12-18

## 2022-12-16 MED ORDER — ACETAMINOPHEN 500 MG PO TABS
1000.0000 mg | ORAL_TABLET | Freq: Three times a day (TID) | ORAL | 0 refills | Status: DC | PRN
Start: 2022-12-16 — End: 2023-10-01

## 2022-12-16 NOTE — Assessment & Plan Note (Signed)
Previously treated with valacyclovir and gabapentin for neuritis.  Rash is progressing with no fluid-filled vesicles and only crusted lesions with some crust removed.  He is however having significant neuritis pain in the distribution of this rash that is not improved by gabapentin 100 mg 3 times daily or existing tramadol 50 mg every 6 hours which she was given after discharge from hospital where he got CPR.  He is not having any other new or worsening symptoms so we will address his pain. - Tylenol 1000 mg every 8 hours as needed, increase gabapentin to 200 mg 3 times daily, and add oxycodone 5 mg every 4 hours as needed, 5-day course

## 2022-12-16 NOTE — Progress Notes (Signed)
   CC: Shingles  HPI:  Edward Mullen is a 67 y.o. male with PMH as below who presents to the clinic for follow-up visit.  Recently seen for active shingles and here for follow-up on this as well as abnormal lab values including hyperkalemia and anion gap metabolic acidosis.  Past Medical History:  Diagnosis Date   Arthritis    "feels like it in my legs" (09/20/2014)   CAD (coronary artery disease)    Chronic kidney disease, stage 3a (HCC)    Chronic systolic CHF (congestive heart failure) (HCC)    Cocaine abuse (HCC)    Hypertension    NICM (nonischemic cardiomyopathy) (HCC)    NSVT (nonsustained ventricular tachycardia) (HCC) 09/06/2016   pre diabetes    patient denies   Review of Systems:   Endorses neuropathic pain and crusted rash in the T2/T3 dermatome.  Improving left-sided rib pain.  Also has stable chronic productive cough with white sputum. Denies chest pain, worsening shortness of breath, fevers, worsening cough, lower extremity edema, dysuria, or decreased urination.  Physical Exam:  Vitals:   12/16/22 1046  BP: 110/75  Pulse: 61  Temp: 97.8 F (36.6 C)  TempSrc: Oral  SpO2: 100%  Weight: 190 lb 1.6 oz (86.2 kg)    Constitutional: Elderly male sitting in his chair. In no acute distress. HENT: Normocephalic, atraumatic,  Cardio:Regular rate and rhythm. Pulm: Normal work of breathing on room air.  Mild wheezing throughout lung fields. ZHY:QMVHQION for extremity edema. Skin: Rash with multiple crusted lesions, some with crust removed, in the T3 dermatome.  Tender to palpation without fluid-filled vesicles or discharge from wounds Neuro:Alert and oriented x3. No focal deficit noted. Psych:Pleasant mood and affect.   Assessment & Plan:   Herpes zoster dermatitis Previously treated with valacyclovir and gabapentin for neuritis.  Rash is progressing with no fluid-filled vesicles and only crusted lesions with some crust removed.  He is however having  significant neuritis pain in the distribution of this rash that is not improved by gabapentin 100 mg 3 times daily or existing tramadol 50 mg every 6 hours which she was given after discharge from hospital where he got CPR.  He is not having any other new or worsening symptoms so we will address his pain. - Tylenol 1000 mg every 8 hours as needed, increase gabapentin to 200 mg 3 times daily, and add oxycodone 5 mg every 4 hours as needed, 5-day course  Hyperkalemia At his last visit his potassium was 5.4 without any other associated symptoms.  He was prescribed Veltassa but does not appear to have picked this up.  Will recheck potassium today and recent blood test if still elevated. - BMP today, follow-up and order Veltassa if still elevated  Increased anion gap metabolic acidosis In last visit he had an anion gap of 19 and a bicarb of 18.  He does take empagliflozin but does not appear to have evidence of diabetes with his highest A1c being 6.2.  Most recent A1c was 4.9 in 05/2022.  No new or worsening symptoms indicative of euglycemic DKA.  We will repeat a BMP today as well as get a urinalysis and beta hydroxybutyrate.  Will plan to discontinue SGLT2 if he has signs of euglycemic DKA.  Will otherwise follow-up as necessary depending on results of labs.    Patient discussed with Dr. Claudie Revering, DO [facility] Internal [facility] Resident PGY-1 Pager: [phone]

## 2022-12-16 NOTE — Addendum Note (Signed)
Addended by: Marletta Bousquet N on: 12/16/2022 10:38 AM   Modules accepted: Orders  

## 2022-12-16 NOTE — Addendum Note (Signed)
Addended by: Bufford Spikes on: 12/16/2022 10:38 AM   Modules accepted: Orders

## 2022-12-16 NOTE — Assessment & Plan Note (Signed)
In last visit he had an anion gap of 19 and a bicarb of 18.  He does take empagliflozin but does not appear to have evidence of diabetes with his highest A1c being 6.2.  Most recent A1c was 4.9 in 05/2022.  No new or worsening symptoms indicative of euglycemic DKA.  We will repeat a BMP today as well as get a urinalysis and beta hydroxybutyrate.  Will plan to discontinue SGLT2 if he has signs of euglycemic DKA.  Will otherwise follow-up as necessary depending on results of labs.

## 2022-12-16 NOTE — Patient Instructions (Addendum)
  Thank you, Mr.Golden L Roxan Hockey, for allowing [place] to provide your care today. Today we discussed . . .  > Shingles and painful rash       - Please take Tylenol 1000 mg every 8 hours, gabapentin 200 mg 3 times a day (increased from 100 mg 3 times a day), and oxycodone 5 mg every 4-6 hours as needed for your pain.  Please stop taking your tramadol while you are taking the oxycodone.  Please continue to watch for any new blisters on your rash and be careful around others to avoid transmitting the virus. > Elevated potassium and anion gap       -We will recheck your metabolic panel today and call you with the results.  If there are any abnormal results we will let you know if there are new medications that you need to pick up from the pharmacy or if there are medications that you need to stop.   I have ordered the following labs for you:  Lab Orders         BMP8+Anion Gap         Urinalysis, Reflex Microscopic         Beta-hydroxybutyric acid       Referrals ordered today:   Referral Orders  No referral(s) requested today      I have ordered the following medication/changed the following medications:   Stop the following medications: Medications Discontinued During This Encounter  Medication Reason   traMADol (ULTRAM) 50 MG tablet Change in therapy   gabapentin (NEURONTIN) 100 MG capsule Reorder     Start the following medications: Meds ordered this encounter  Medications   oxyCODONE (ROXICODONE) 5 MG immediate release tablet    Sig: Take 1 tablet (5 mg total) by mouth every 4 (four) hours as needed for up to 5 days.    Dispense:  20 tablet    Refill:  0   gabapentin (NEURONTIN) 100 MG capsule    Sig: Take 2 capsules (200 mg total) by mouth 3 (three) times daily.    Dispense:  90 capsule    Refill:  0   acetaminophen (TYLENOL) 500 MG tablet    Sig: Take 2 tablets (1,000 mg total) by mouth every 8 (eight) hours as needed.    Dispense:  100 tablet    Refill:  0      Follow  up:  1 Month     Remember:     Should you have any questions or concerns please call the internal medicine clinic at [phone].     Rocky Morel, DO [facility]

## 2022-12-16 NOTE — Assessment & Plan Note (Signed)
At his last visit his potassium was 5.4 without any other associated symptoms.  He was prescribed Veltassa but does not appear to have picked this up.  Will recheck potassium today and recent blood test if still elevated. - BMP today, follow-up and order Veltassa if still elevated

## 2022-12-16 NOTE — Progress Notes (Signed)
Chemistry panel looks much improved. Called to inform of essentially normal results.

## 2022-12-17 LAB — URINALYSIS, ROUTINE W REFLEX MICROSCOPIC
Bilirubin, UA: NEGATIVE
Ketones, UA: NEGATIVE
Leukocytes,UA: NEGATIVE
Nitrite, UA: NEGATIVE
RBC, UA: NEGATIVE
Specific Gravity, UA: 1.027 (ref 1.005–1.030)
Urobilinogen, Ur: 0.2 mg/dL (ref 0.2–1.0)
pH, UA: 5 (ref 5.0–7.5)

## 2022-12-17 NOTE — Progress Notes (Signed)
Internal Medicine Clinic Attending  Case discussed with Dr. Goodwin  At the time of the visit.  We reviewed the resident's history and exam and pertinent patient test results.  I agree with the assessment, diagnosis, and plan of care documented in the resident's note.  

## 2022-12-17 NOTE — Progress Notes (Signed)
Call and inform patient the results of his urinalysis with 3+ glucose and normal beta hydroxybutyric acid.  He had already been called and informed about the results of his BMP which showed that his potassium normalized and his anion gap and bicarb were both normal as well.  All questions were answered.

## 2022-12-18 ENCOUNTER — Telehealth: Payer: Self-pay

## 2022-12-18 NOTE — Telephone Encounter (Signed)
(  12:17 pm) PC SW called and scheduled an initial visit with patient. Initial [facility] visit scheduled for 12/19/22 @ 2 pm.

## 2022-12-19 ENCOUNTER — Other Ambulatory Visit: Payer: Medicaid Other

## 2022-12-19 DIAGNOSIS — Z515 Encounter for palliative care: Secondary | ICD-10-CM

## 2022-12-19 NOTE — Progress Notes (Signed)
COMMUNITY PALLIATIVE CARE SW NOTE  PATIENT NAME: Edward Mullen DOB: 1955/09/11 MRN: [id number]  PRIMARY CARE PROVIDER: Morene Crocker, MD  RESPONSIBLE PARTY:  Acct ID - Guarantor Home Phone Work Phone Relationship Acct Type  [id number] AMAHRI, BONGO* [phone]  Self P/F     [address], [place], [place] [address]                     Initial [facility]-Palliative Care Visit/Clinical Social Work  Person (s) encountered: Patient, LCSW-M.Darris Staiger  Purpose of the Visit: Initial [facility]-Palliative Care face-to-face visit  Assessment: LCSW completed a review and assessment of patient's family/social, mental health and medical history, allergies, medications, and health (functional) status, including patient self-reporting and a review of relevant consultative reports was completed today as part of a comprehensive evaluation by [employer] Palliative Care Clinical Social Work services.  Summary of Encounter: Patient currently has shingles under his right breast around to the middle of his back. He report that he has pain and itching. He has OxyContin for pain. He saw the doctor this morning at [facility]. He has a follow-up visit in a week with [facility].  The shingles is causing pain and discomfort where he unable to sleep. He report that he is eating okay. He is independent for all ADL's. Patient ambulates with a cane or walker when he outside of the house. Bit walks independently in his home.   Patient report that he had acardiac arrest in April 2024 that resulted in him being  He has continues marijuana 1-2 joints a day. He denies drinking alcohol.  Patient is receiving PT 2x week.   Patient lives with his daughter-Dashannon, her four children and three great-grand kids in an apartment. Born and raised in [place], [place]. Patient graduated from [employer]. Patient was briefly in the [employer]. Patient is married, but currently separated from his wife.  She lives with her  own grandchildren. Patient worked in [employer]. Patient attends appointments via his daughter and [facility]. Patient is also receiving mobile meals. He also receives Sales executive. He receives a monthly cash card through [employer]. He also has Medicaid.   Social Detriments of Health Nutrition: Patient report that he is eating well, at least 1-3 meals a day. He receives mobile meals. Transportation:Patient's daughter transports him to appointment. He also uses [employer] transportation. Housing: Patient resides in an apartment with his daughter, grandchildren (4) and great grandchildren (3) Financial Support: No financial concerns noted. He receives food stamps and a monthly cash card through his insurance company. He is also a Medicaid recipient. Social Isolation: Patient has good family support.  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  Patient Goals: To remain as independent as possible. He also wants to be relieved of pain related having shingles.   Social Work Interventions Provided: assessment of needs and comfort of patient, education, supportive presence, obtained information for psychosocial assessment, clarified goals of care, code status and scheduled next visit.  Collaboration/Coordination of Care:   SW reviewed and left advance directives paperwork in the home for patient to discuss with his daughter.   Plan/Follow-up: Ongoing supportive visits with patient to assess his needs and comfort; provide support, resources and educations that align with his goals.   Social History   Tobacco Use   Smoking status: Some Days    Packs/day: 0.10    Years: 48.00    Additional pack years: 0.00    Total pack  years: 4.80    Types: Cigarettes   Smokeless tobacco: Never   Tobacco comments:    cutting back 2  per day.  No cigs  Substance Use Topics   Alcohol use: Yes    Alcohol/week: 1.0 standard drink of alcohol    Types: 1 Cans  of beer per week    Comment: occasionally   Cardiac Workbook: To be further assessed Dementia Workbook: No CODE STATUS: Full Code ADVANCED DIRECTIVES: No, documents left in the home MOST FORM COMPLETE: No, documents left in the home HOSPICE EDUCATION PROVIDED: No  PPS: 60% Risk Score: Medium Next Scheduled visit: 12/31/22 @ 9 am.  Clydia Llano, LCSW

## 2022-12-31 ENCOUNTER — Other Ambulatory Visit: Payer: Medicaid Other

## 2022-12-31 VITALS — BP 122/72 | HR 72 | Temp 97.4°F

## 2022-12-31 DIAGNOSIS — Z515 Encounter for palliative care: Secondary | ICD-10-CM

## 2022-12-31 NOTE — Progress Notes (Signed)
PATIENT NAME: Edward Mullen DOB: September 12, 1955 MRN: [id number]  PRIMARY CARE PROVIDER: Morene Crocker, MD  RESPONSIBLE PARTY:  Acct ID - Guarantor Home Phone Work Phone Relationship Acct Type  [id number] FOXX, MATAMOROS* [phone]  Self P/F     [address], [place], [place] [address]    Palliative Care/[facility] Follow Up Encounter Note   Completed home visit.        HISTORY OF PRESENT ILLNESS:  CHF, COPD, urinary retention, CKD 3, HTN, Shingles  TODAY'S VISIT:  Respiratory: denies SOB at this time but acknowledges SOB when ambulating outside of the home  Cardiac: no edema at this time  Cognitive: alert and oriented   Appetite: eats 1 reduced meal, 1 full meal, snacks; drinks 2 liters of water daily; pt was cooking bacon when PC nurse arrived  GI/GU: has a BM every other day; sometimes he has hard BMs because of taking pain meds; pt may need a stool softener as he c/o of "hard balls" when going to the bathroom; sometimes he can't urinate when he feels he has to, otherwise no issues   Mobility: uses the walker when he needs it but always uses it outside the home; pt does not walk in a fully upright position  ADLs:  independent with bathing, grooming, dressing; all other ADLs pt is dependent   Sleeping Pattern: sleeps through the night when he can finally go to sleep; encouraged pt to set a bedtime and commit to going to bed at that time nightly; pt acknowledged   Pain: c/o 8/10 pain to lower back and his ribs (which were cracked from CPR)    Goals of Care: To stay in the home      Next Appt Scheduled For: 01/14/23 @ 10am  Risk Score: Medium      CODE STATUS: Full Code ADVANCED DIRECTIVES: N MOST FORM: N PPS: 60%   PHYSICAL EXAM:   VITALS: Today's Vitals   12/31/22 0914  BP: 122/72  Pulse: 72  Temp: (!) 97.4 F (36.3 C)  SpO2: 99%  PainSc: 8   PainLoc: Back    LUNGS: clear to auscultation , decreased breath sounds CARDIAC: Cor  RRR EXTREMITIES: AROM x4 SKIN: cool, dry, intact, no tenting noted NEURO: negative except for gait problems and weakness       Amed Datta Clementeen Graham, LPN

## 2022-12-31 NOTE — Progress Notes (Signed)
COMMUNITY PALLIATIVE CARE SW NOTE  PATIENT NAME: Edward Mullen DOB: [birth year] MRN: [id number]  PRIMARY CARE PROVIDER: Morene Crocker, MD  RESPONSIBLE PARTY:  Acct ID - Guarantor Home Phone Work Phone Relationship Acct Type  [id number] Edward Mullen, FAIRFAX* [phone]  Self P/F     [address] [place], [place] [address]   [facility]-Palliative Care Encounter/Clinical Social Work  HISTORY OF PRESENT ILLNESS:  CHF, COPD, urinary retention, CKD 3, HTN, Shingles   Person (s) encountered: Patient, LCSW-M.Alekhya Gravlin   Purpose of the Visit: [facility]-Palliative Care Virtual Encounter   Assessment: LCSW completed a review and assessment of patient's family/social, mental health and medical history, allergies, medications, and health (functional) status, including patient self-reporting and a review of relevant consultative reports was completed today as part of a comprehensive evaluation by [employer] [facility] Palliative Care Clinical Social Work services.   Summary of Encounter:  LCSW was added to the visit virtually by palliative care nurse. SW greeted patient. He denied that he was having any pain or SOB during this visit. He continues to ambulate independently inside and outside of the home, but has assistive device (walker). He remains independent for all ADL's. Patient is eating fair, which includes 1 reduced meal and snacks per day. SW followed-up with patient on advance directive paperwork. He stated that his daughter is still reviewing the documents. Patient states that he is interested in completing the documents.  No other concerns noted.    Social Detriments of Health Nutrition: Patient report that he is eating well, at least 1 small meal a day. He receives mobile meals. Transportation:Patient's daughter transports him to appointments. He also uses [employer] transportation. Housing: Patient resides in an apartment with his daughter, grandchildren (4) and great  grandchildren (3) Financial Support: No financial concerns noted. He receives food stamps and a monthly cash card through his insurance company. He is also a Medicaid recipient. Social Isolation: Patient has good family support.  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   Patient Goals: To remain as independent as possible. He also wants to be relieved of pain and soreness related to receiving CPR following a cardiac event.   Social Work Interventions Provided: assessment of needs and comfort of patient, education, supportive presence, obtained information for psychosocial assessment, clarified goals of care, code status and scheduled next visit.   Collaboration/Coordination of Care:   Advance directives paperwork left in the home for patient to discuss with his daughter.    Plan/Follow-up: Ongoing supportive visits with patient to assess his needs and comfort; provide support, resources and education that align with his goals.    Social History   Tobacco Use   Smoking status: Some Days    Packs/day: 0.10    Years: 48.00    Additional pack years: 0.00    Total pack years: 4.80    Types: Cigarettes   Smokeless tobacco: Never   Tobacco comments:    cutting back 2  per day.  No cigs  Substance Use Topics   Alcohol use: Yes    Alcohol/week: 1.0 standard drink of alcohol    Types: 1 Cans of beer per week    Comment: occasionally   Cardiac Workbook: No Dementia Workbook: No CODE STATUS: Full Code ADVANCED DIRECTIVES: No, documents left in the home MOST FORM COMPLETE: No, documents left in the home HOSPICE EDUCATION PROVIDED: No   PPS: 60% Risk Score: Medium Next Scheduled visit: 01/14/23 @ 10 am.   Clydia Llano, LCSW

## 2023-01-03 ENCOUNTER — Other Ambulatory Visit (HOSPITAL_COMMUNITY): Payer: Self-pay

## 2023-01-03 MED ORDER — ZOLPIDEM TARTRATE 10 MG PO TABS
10.0000 mg | ORAL_TABLET | Freq: Every evening | ORAL | 5 refills | Status: DC | PRN
  Filled 2023-01-03: qty 30, 30d supply, fill #0

## 2023-01-14 ENCOUNTER — Other Ambulatory Visit: Payer: Medicaid Other

## 2023-01-14 DIAGNOSIS — Z515 Encounter for palliative care: Secondary | ICD-10-CM

## 2023-01-14 NOTE — Progress Notes (Signed)
COMMUNITY PALLIATIVE CARE SW NOTE  PATIENT NAME: Edward Mullen DOB: [birth year] MRN: [id number]  PRIMARY CARE PROVIDER: Morene Crocker, MD  RESPONSIBLE PARTY:  Acct ID - Guarantor Home Phone Work Phone Relationship Acct Type  [id number] Edward Mullen, Edward Mullen* [phone]  Self P/F     [address] [place], [place] [address]   [facility] Visit /Clinical Social Work   HISTORY OF PRESENT ILLNESS:  CHF, COPD, urinary retention, CKD 3, HTN, Shingles    Person (s) encountered: Patient, LCSW-M.Kendel Bessey   Purpose of the Visit: [facility] face-to-face visit   Assessment: LCSW completed a review and assessment of patient's family/social, mental health and medical history, allergies, medications, and health (functional) status, including patient self-reporting and a review of relevant consultative reports was completed today as part of a comprehensive evaluation by [facility] Clinical Social Work services.   Summary of Encounter:  LCSW completed a visit with patient at his home. He reported that he was doing okay. He report that he continues to have pain that he described as a "stinging sensation" to his right side intermittently. He uses Calamine lotion on that area. He report that he also had back pain this morning and had to use his walker to ambulate. SW observed patient leaning forward on his walker.He also reported that he continues to have soreness in his chest after being resuscitated during a cardiac event. He rated his pain as an 8. He has pain medication regiment manages the pain. He denied that he was having any SOB during this visit. He continues to ambulate independently inside and outside of the home, but will use his walker as needed. He remains independent for all ADL's. He verbalized no concerns at this time. No other concerns noted.    Social Detriments of Health Nutrition: Patient report that he is eating well. He  receives mobile meals. Transportation:Patient's daughter transports him to appointments. He also uses [employer] transportation. Housing: Patient resides in an apartment with his daughter, grandchildren (4) and great grandchildren (3) Financial Support: No financial concerns noted. He receives food stamps and a monthly cash card through his insurance company. He is also a Medicaid recipient. Social Isolation: Patient has good family support.  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   Patient Goals: To remain as independent as possible. He also wants to be relieved of pain and soreness related to receiving CPR following a cardiac event.   Social Work Interventions Provided: assessment of needs and comfort of patient, supportive presence, and supportive counseling, scheduled next visit.   Collaboration/Coordination of Care:   SW to assist with advance directive paperwork when appropriate..    Plan/Follow-up: Ongoing supportive visits with patient to assess his needs and comfort; provide support, resources and education that align with his goals.   Social History   Tobacco Use   Smoking status: Some Days    Packs/day: 0.10    Years: 48.00    Additional pack years: 0.00    Total pack years: 4.80    Types: Cigarettes   Smokeless tobacco: Never   Tobacco comments:    cutting back 2  per day.  No cigs  Substance Use Topics   Alcohol use: Yes    Alcohol/week: 1.0 standard drink of alcohol    Types: 1 Cans of beer per week    Comment: occasionally    CODE STATUS: Full Code ADVANCED DIRECTIVES: No MOST FORM COMPLETE: No HOSPICE EDUCATION PROVIDED: No  PPS:60%  Pain Score: 8 Risk Score: Low Next Scheduled Visit: 02/17/23 @10  am.   Clydia Llano, LCSW

## 2023-01-15 ENCOUNTER — Encounter: Payer: Medicare HMO | Admitting: Student

## 2023-01-16 ENCOUNTER — Ambulatory Visit (INDEPENDENT_AMBULATORY_CARE_PROVIDER_SITE_OTHER): Payer: Medicare HMO

## 2023-01-16 DIAGNOSIS — I428 Other cardiomyopathies: Secondary | ICD-10-CM

## 2023-01-16 LAB — CUP PACEART REMOTE DEVICE CHECK
Battery Remaining Longevity: 78 mo
Battery Remaining Percentage: 90 %
Battery Voltage: 3.11 V
Brady Statistic AP VP Percent: 28 %
Brady Statistic AP VS Percent: 1 %
Brady Statistic AS VP Percent: 67 %
Brady Statistic AS VS Percent: 2 %
Brady Statistic RA Percent Paced: 27 %
Date Time Interrogation Session: 20240606020018
HighPow Impedance: 72 Ohm
HighPow Impedance: 72 Ohm
Implantable Lead Connection Status: 753985
Implantable Lead Connection Status: 753985
Implantable Lead Connection Status: 753985
Implantable Lead Implant Date: 20231206
Implantable Lead Implant Date: 20231206
Implantable Lead Implant Date: 20231206
Implantable Lead Location: 753858
Implantable Lead Location: 753859
Implantable Lead Location: 753860
Implantable Lead Model: 7122
Implantable Pulse Generator Implant Date: 20231206
Lead Channel Impedance Value: 440 Ohm
Lead Channel Impedance Value: 530 Ohm
Lead Channel Impedance Value: 590 Ohm
Lead Channel Pacing Threshold Amplitude: 0.5 V
Lead Channel Pacing Threshold Amplitude: 0.625 V
Lead Channel Pacing Threshold Amplitude: 0.75 V
Lead Channel Pacing Threshold Pulse Width: 0.5 ms
Lead Channel Pacing Threshold Pulse Width: 0.5 ms
Lead Channel Pacing Threshold Pulse Width: 0.6 ms
Lead Channel Sensing Intrinsic Amplitude: 12 mV
Lead Channel Sensing Intrinsic Amplitude: 2.5 mV
Lead Channel Setting Pacing Amplitude: 1.5 V
Lead Channel Setting Pacing Amplitude: 2 V
Lead Channel Setting Pacing Amplitude: 2 V
Lead Channel Setting Pacing Pulse Width: 0.5 ms
Lead Channel Setting Pacing Pulse Width: 1 ms
Lead Channel Setting Sensing Sensitivity: 0.5 mV
Pulse Gen Serial Number: 5556126
Zone Setting Status: 755011

## 2023-01-27 ENCOUNTER — Other Ambulatory Visit (HOSPITAL_COMMUNITY): Payer: Self-pay | Admitting: Family Medicine

## 2023-02-05 NOTE — Progress Notes (Signed)
Remote ICD transmission.   

## 2023-03-06 ENCOUNTER — Emergency Department (HOSPITAL_COMMUNITY): Payer: Medicare HMO

## 2023-03-06 ENCOUNTER — Encounter (HOSPITAL_COMMUNITY): Payer: Self-pay

## 2023-03-06 ENCOUNTER — Emergency Department (HOSPITAL_COMMUNITY)
Admission: EM | Admit: 2023-03-06 | Discharge: 2023-03-06 | Disposition: A | Discharge status: Home / Self Care | Payer: Medicare HMO | Attending: Emergency Medicine | Admitting: Emergency Medicine

## 2023-03-06 DIAGNOSIS — N453 Epididymo-orchitis: Secondary | ICD-10-CM | POA: Insufficient documentation

## 2023-03-06 DIAGNOSIS — Z79899 Other long term (current) drug therapy: Secondary | ICD-10-CM | POA: Insufficient documentation

## 2023-03-06 DIAGNOSIS — I509 Heart failure, unspecified: Secondary | ICD-10-CM | POA: Insufficient documentation

## 2023-03-06 DIAGNOSIS — Z7982 Long term (current) use of aspirin: Secondary | ICD-10-CM | POA: Diagnosis not present

## 2023-03-06 DIAGNOSIS — J449 Chronic obstructive pulmonary disease, unspecified: Secondary | ICD-10-CM | POA: Diagnosis not present

## 2023-03-06 DIAGNOSIS — Z7984 Long term (current) use of oral hypoglycemic drugs: Secondary | ICD-10-CM | POA: Insufficient documentation

## 2023-03-06 DIAGNOSIS — I130 Hypertensive heart and chronic kidney disease with heart failure and stage 1 through stage 4 chronic kidney disease, or unspecified chronic kidney disease: Secondary | ICD-10-CM | POA: Diagnosis not present

## 2023-03-06 DIAGNOSIS — R000 Tachycardia, unspecified: Secondary | ICD-10-CM | POA: Diagnosis not present

## 2023-03-06 DIAGNOSIS — N183 Chronic kidney disease, stage 3 unspecified: Secondary | ICD-10-CM | POA: Diagnosis not present

## 2023-03-06 DIAGNOSIS — R339 Retention of urine, unspecified: Secondary | ICD-10-CM | POA: Diagnosis present

## 2023-03-06 LAB — COMPREHENSIVE METABOLIC PANEL
ALT: 15 U/L (ref 0–44)
AST: 23 U/L (ref 15–41)
Albumin: 3 g/dL — ABNORMAL LOW (ref 3.5–5.0)
Alkaline Phosphatase: 92 U/L (ref 38–126)
Anion gap: 17 — ABNORMAL HIGH (ref 5–15)
BUN: 19 mg/dL (ref 8–23)
CO2: 20 mmol/L — ABNORMAL LOW (ref 22–32)
Calcium: 9 mg/dL (ref 8.9–10.3)
Chloride: 95 mmol/L — ABNORMAL LOW (ref 98–111)
Creatinine, Ser: 1.34 mg/dL — ABNORMAL HIGH (ref 0.61–1.24)
GFR, Estimated: 58 mL/min — ABNORMAL LOW (ref >60)
Glucose, Bld: 133 mg/dL — ABNORMAL HIGH (ref 70–99)
Potassium: 4.1 mmol/L (ref 3.5–5.1)
Sodium: 132 mmol/L — ABNORMAL LOW (ref 135–145)
Total Bilirubin: 1.5 mg/dL — ABNORMAL HIGH (ref 0.3–1.2)
Total Protein: 7.7 g/dL (ref 6.5–8.1)

## 2023-03-06 LAB — CBC WITH DIFFERENTIAL/PLATELET
Abs Immature Granulocytes: 0.02 10*3/uL (ref 0.00–0.07)
Basophils Absolute: 0 10*3/uL (ref 0.0–0.1)
Basophils Relative: 0 %
Eosinophils Absolute: 0 10*3/uL (ref 0.0–0.5)
Eosinophils Relative: 0 %
HCT: 43 % (ref 39.0–52.0)
Hemoglobin: 13.9 g/dL (ref 13.0–17.0)
Immature Granulocytes: 0 %
Lymphocytes Relative: 4 %
Lymphs Abs: 0.4 10*3/uL — ABNORMAL LOW (ref 0.7–4.0)
MCH: 26.5 pg (ref 26.0–34.0)
MCHC: 32.3 g/dL (ref 30.0–36.0)
MCV: 82.1 fL (ref 80.0–100.0)
Monocytes Absolute: 1.2 10*3/uL — ABNORMAL HIGH (ref 0.1–1.0)
Monocytes Relative: 12 %
Neutro Abs: 8.1 10*3/uL — ABNORMAL HIGH (ref 1.7–7.7)
Neutrophils Relative %: 84 %
Platelets: 176 10*3/uL (ref 150–400)
RBC: 5.24 MIL/uL (ref 4.22–5.81)
RDW: 15.9 % — ABNORMAL HIGH (ref 11.5–15.5)
WBC: 9.7 10*3/uL (ref 4.0–10.5)
nRBC: 0 % (ref 0.0–0.2)

## 2023-03-06 LAB — URINALYSIS, W/ REFLEX TO CULTURE (INFECTION SUSPECTED)
Glucose, UA: 100 mg/dL — AB
Ketones, ur: 15 mg/dL — AB
Nitrite: NEGATIVE
Protein, ur: 100 mg/dL — AB
RBC / HPF: >50 RBC/hpf (ref 0–5)
Specific Gravity, Urine: 1.02 (ref 1.005–1.030)
WBC, UA: >50 WBC/hpf (ref 0–5)
pH: 5.5 (ref 5.0–8.0)

## 2023-03-06 LAB — LACTATE DEHYDROGENASE: LDH: 196 U/L — ABNORMAL HIGH (ref 98–192)

## 2023-03-06 LAB — BRAIN NATRIURETIC PEPTIDE: B Natriuretic Peptide: 2094.2 pg/mL — ABNORMAL HIGH (ref 0.0–100.0)

## 2023-03-06 MED ORDER — IPRATROPIUM-ALBUTEROL 0.5-2.5 (3) MG/3ML IN SOLN
3.0000 mL | Freq: Once | RESPIRATORY_TRACT | Status: AC
  Administered 2023-03-06: 3 mL via RESPIRATORY_TRACT
  Filled 2023-03-06: qty 3

## 2023-03-06 MED ORDER — LEVOFLOXACIN 750 MG PO TABS
750.0000 mg | ORAL_TABLET | Freq: Once | ORAL | Status: AC
  Administered 2023-03-06: 750 mg via ORAL
  Filled 2023-03-06: qty 1

## 2023-03-06 MED ORDER — HYDROCODONE-ACETAMINOPHEN 5-325 MG PO TABS: 1.0000 | ORAL_TABLET | Freq: Four times a day (QID) | ORAL | 0 refills | Status: DC | PRN

## 2023-03-06 MED ORDER — HYDROCODONE-ACETAMINOPHEN 5-325 MG PO TABS
1.0000 | ORAL_TABLET | Freq: Once | ORAL | Status: AC
  Administered 2023-03-06: 1 via ORAL
  Filled 2023-03-06: qty 1

## 2023-03-06 MED ORDER — LEVOFLOXACIN 750 MG PO TABS: 750.0000 mg | ORAL_TABLET | Freq: Every day | ORAL | 0 refills | Status: DC

## 2023-03-06 NOTE — ED Provider Notes (Signed)
[facility] EMERGENCY DEPARTMENT AT [facility] Provider Note   CSN: [id number] Arrival date & time: 03/06/23  1615     History  Chief Complaint  Patient presents with   Urinary Retention    Edward Mullen is a 67 y.o. male.  Pt is a 66y/o male with hx of CHF, COPD, urinary retention, CKD 3, HTN, Shingles with cardiac arrest in April 24 from [place] who is presenting today with complaints of urinary retention, a swelling on his groin, low-grade fever and some shortness of breath that started today.  Patient reports he saw his doctor yesterday and they told him he had an abscess.  However he states the last time he urinated was this morning and he feels like he needs to go but nothing will come out.  He is having pressure and discomfort in his lower abdomen.  He has had an intermittent cough but denies any sputum production.  He does have inhalers at home but has not used them today.  He has not noticed any new swelling in his lower extremities.  The history is provided by the patient and medical records.       Home Medications Prior to Admission medications   Medication Sig Start Date End Date Taking? Authorizing Provider  HYDROcodone-acetaminophen (NORCO/VICODIN) 5-325 MG tablet Take 1 tablet by mouth every 6 (six) hours as needed. 03/06/23  Yes Gwyneth Sprout, MD  levofloxacin (LEVAQUIN) 750 MG tablet Take 1 tablet (750 mg total) by mouth daily. 03/06/23  Yes Gwyneth Sprout, MD  acetaminophen (TYLENOL) 500 MG tablet Take 2 tablets (1,000 mg total) by mouth every 8 (eight) hours as needed. 12/16/22   Rocky Morel, DO  albuterol (PROVENTIL) (2.5 MG/3ML) 0.083% nebulizer solution USE ONE VIAL (2.5 MG TOTAL) BY NEBULIZATION EVERY 6 (SIX) HOURS AS NEEDED FOR SHORTNESS OF BREATH OR WHEEZING. 10/29/22   Morene Crocker, MD  albuterol (VENTOLIN HFA) 108 (90 Base) MCG/ACT inhaler INHALE 1 TO 2 PUFFS BY MOUTH EVERY 6 HOURS AS NEEDED FOR WHEEZING OR SHORTNESS OF BREATH 10/29/22    Morene Crocker, MD  amiodarone (PACERONE) 200 MG tablet Take 1 tablet (200 mg total) by mouth daily. 11/26/22   Zannie Cove, MD  ASPIRIN LOW DOSE 81 MG tablet TAKE 1 TABLET (81 MG TOTAL) BY MOUTH DAILY. SWALLOW WHOLE(AM) 01/27/23   [place], Anderson Malta, FNP  bismuth subsalicylate (PEPTO BISMOL) 262 MG/15ML suspension Take 30 mLs by mouth every 6 (six) hours as needed for indigestion.    [provider]  BREO ELLIPTA 100-25 MCG/ACT AEPB Inhale 1 puff into the lungs daily. 08/02/22 08/02/23  Morene Crocker, MD  carvedilol (COREG) 3.125 MG tablet Take 1 tablet (3.125 mg total) by mouth 2 (two) times daily. 01/29/22 01/29/23  Jacklynn Ganong, FNP  Cholecalciferol (VITAMIN D3) 50 MCG (2000 UT) TABS Take 1 tablet by mouth daily.    [provider]  empagliflozin (JARDIANCE) 10 MG TABS tablet Take 1 tablet (10 mg total) by mouth daily. 11/26/22   Zannie Cove, MD  gabapentin (NEURONTIN) 100 MG capsule Take 2 capsules (200 mg total) by mouth 3 (three) times daily. 12/16/22 01/15/23  Rocky Morel, DO  mexiletine (MEXITIL) 150 MG capsule Take 1 capsule (150 mg total) by mouth 2 (two) times daily. 07/31/22   Duke Salvia, MD  patiromer (VELTASSA) 8.4 g packet Take 1 packet (8.4 g total) by mouth daily. 12/03/22   Adron Bene, MD  rosuvastatin (CRESTOR) 5 MG tablet TAKE 1 TABLET (5 MG TOTAL)  BY MOUTH DAILY. 12/26/20   Laurey Morale, MD  tamsulosin (FLOMAX) 0.4 MG CAPS capsule TAKE ONE CAPSULE BY MOUTH ONCE DAILY AFTER SUPPER 02/20/21   Belva Agee, MD  torsemide (DEMADEX) 20 MG tablet Take 1 tablet (20 mg total) by mouth daily. 11/22/22   Zannie Cove, MD  umeclidinium bromide (INCRUSE ELLIPTA) 62.5 MCG/ACT AEPB Inhale 1 puff into the lungs daily. 06/17/22   Masters, Katie, DO  zolpidem (AMBIEN) 10 MG tablet Take 1 tablet (10 mg total) by mouth at bedtime as needed for sleep. 09/16/22 01/03/23        Allergies    Patient has no known allergies.    Review  of Systems   Review of Systems  Physical Exam Updated Vital Signs BP (!) 140/88 (BP Location: Left Arm)   Pulse (!) 104   Temp 98.2 F (36.8 C) (Oral)   Resp 19   Ht 6' (1.829 m)   Wt 83 kg   SpO2 98%   BMI 24.82 kg/m  Physical Exam Vitals and nursing note reviewed. Exam conducted with a chaperone present.  Constitutional:      General: He is not in acute distress.    Appearance: He is well-developed.  HENT:     Head: Normocephalic and atraumatic.  Eyes:     Conjunctiva/sclera: Conjunctivae normal.     Pupils: Pupils are equal, round, and reactive to light.  Cardiovascular:     Rate and Rhythm: Regular rhythm. Tachycardia present.     Heart sounds: No murmur heard. Pulmonary:     Effort: Pulmonary effort is normal. Tachypnea present. No respiratory distress.     Breath sounds: Rhonchi present. No wheezing or rales.  Abdominal:     General: There is no distension.     Palpations: Abdomen is soft.     Tenderness: There is abdominal tenderness in the suprapubic area. There is no guarding or rebound.     Hernia: There is no hernia in the right inguinal area.  Genitourinary:    Penis: Circumcised.      Testes:        Right: Mass, tenderness and swelling present.     Epididymis:     Right: Enlarged. Mass and tenderness present.     Comments: Left testicle is normal Musculoskeletal:        General: No tenderness. Normal range of motion.     Cervical back: Normal range of motion and neck supple.     Right lower leg: No edema.     Left lower leg: No edema.  Skin:    General: Skin is warm and dry.     Findings: No erythema or rash.  Neurological:     Mental Status: He is alert and oriented to person, place, and time. Mental status is at baseline.  Psychiatric:        Behavior: Behavior normal.     ED Results / Procedures / Treatments   Labs (all labs ordered are listed, but only abnormal results are displayed) Labs Reviewed  CBC WITH DIFFERENTIAL/PLATELET -  Abnormal; Notable for the following components:      Result Value   RDW 15.9 (*)    Neutro Abs 8.1 (*)    Lymphs Abs 0.4 (*)    Monocytes Absolute 1.2 (*)    All other components within normal limits  COMPREHENSIVE METABOLIC PANEL - Abnormal; Notable for the following components:   Sodium 132 (*)    Chloride 95 (*)    CO2 20 (*)  Glucose, Bld 133 (*)    Creatinine, Ser 1.34 (*)    Albumin 3.0 (*)    Total Bilirubin 1.5 (*)    GFR, Estimated 58 (*)    Anion gap 17 (*)    All other components within normal limits  URINALYSIS, W/ REFLEX TO CULTURE (INFECTION SUSPECTED) - Abnormal; Notable for the following components:   Color, Urine AMBER (*)    APPearance CLOUDY (*)    Glucose, UA 100 (*)    Hgb urine dipstick LARGE (*)    Bilirubin Urine SMALL (*)    Ketones, ur 15 (*)    Protein, ur 100 (*)    Leukocytes,Ua MODERATE (*)    Bacteria, UA FEW (*)    All other components within normal limits  BRAIN NATRIURETIC PEPTIDE - Abnormal; Notable for the following components:   B Natriuretic Peptide 2,094.2 (*)    All other components within normal limits  LACTATE DEHYDROGENASE - Abnormal; Notable for the following components:   LDH 196 (*)    All other components within normal limits  URINE CULTURE    EKG EKG Interpretation Date/Time:  Thursday March 06 2023 16:53:05 EDT Ventricular Rate:  101 PR Interval:  151 QRS Duration:  187 QT Interval:  423 QTC Calculation: 549 R Axis:   -87  Text Interpretation: Atrial-sensed ventricular-paced rhythm Sinus tachycardia Left bundle branch block Confirmed by Gwyneth Sprout ([id number]) on 03/06/2023 5:30:24 PM  Radiology US SCROTUM W/DOPPLER  Result Date: 03/06/2023 CLINICAL DATA:  Right testicular pain and swelling EXAM: SCROTAL ULTRASOUND DOPPLER ULTRASOUND OF THE TESTICLES TECHNIQUE: Complete ultrasound examination of the testicles, epididymis, and other scrotal structures was performed. Color and spectral Doppler ultrasound were also  utilized to evaluate blood flow to the testicles. COMPARISON:  None Available. FINDINGS: Right testicle Measurements: 4.1 x 2.6 x 3.3 cm. Diffusely heterogeneous and hypervascular. No mass or microlithiasis visualized. Left testicle Measurements: 4.1 x 1.8 x 2.8 cm. No mass or microlithiasis visualized. Right epididymis:  Enlarged and hyperemic. Left epididymis:  Normal in size and appearance. Hydrocele: Moderate right hydrocele and small left hydrocele present. Within within the midline scrotal soft tissues there is a simple cystic area measuring 2.4 x 2.3 x 3.5 cm. Varicocele:  None visualized. Pulsed Doppler interrogation of both testes demonstrates normal low resistance arterial and venous waveforms bilaterally. IMPRESSION: 1. Heterogeneous and hypervascular right testicle and epididymis compatible with epididymo-orchitis. 2. Moderate right and small left hydroceles. 3. Simple cystic area within the midline scrotal soft tissues measuring 2.4 x 2.3 x 3.5 cm. Electronically Signed   By: Darliss Cheney M.D.   On: 03/06/2023 18:20   DG Chest Port 1 View  Result Date: 03/06/2023 CLINICAL DATA:  Shortness of breath. EXAM: PORTABLE CHEST 1 VIEW COMPARISON:  November 24, 2022. FINDINGS: Stable cardiomediastinal silhouette. Left-sided defibrillator is unchanged. No acute pulmonary disease is noted. Bony thorax is unremarkable. IMPRESSION: No active disease. Electronically Signed   By: Lupita Raider M.D.   On: 03/06/2023 17:22    Procedures Procedures    Medications Ordered in ED Medications  ipratropium-albuterol (DUONEB) 0.5-2.5 (3) MG/3ML nebulizer solution 3 mL (3 mLs Nebulization Given 03/06/23 1655)  HYDROcodone-acetaminophen (NORCO/VICODIN) 5-325 MG per tablet 1 tablet (1 tablet Oral Given 03/06/23 1706)    ED Course/ Medical Decision Making/ A&P                             Medical Decision Making Amount and/or Complexity  of Data Reviewed External Data Reviewed: notes. Labs: ordered.  Decision-making details documented in ED Course. Radiology: ordered and independent interpretation performed. Decision-making details documented in ED Course. ECG/medicine tests: ordered and independent interpretation performed. Decision-making details documented in ED Course.  Risk Prescription drug management.   Pt with multiple medical problems and comorbidities and presenting today with a complaint that caries a high risk for morbidity and mortality.  Here today initially complaining of testicular pain on the right since Monday and reports he has not been able to urinate well in the last few days but has not urinated all since this morning and feels like he needs to go but nothing can come out.  He did not think he had fever but somebody told him he felt warm today.  He has had a persistent cough and pain with his shingles since having his cardiac arrest in April.  He reports today alone he started feeling short of breath as well.  Patient does have some rhonchi bilaterally but no distal edema and denies any weight gain.  His right testicle is very tender and swollen but there is no obvious signs of skin breakdown or abscess.  Nothing that appears to be Fournier's gangrene.  Bladder scan did show significant urinary retention.  A Foley catheter was placed, patient will need ultrasound to further evaluate the testicle but when speaking with the patient it sounds like he has had abnormal anatomy there for a while he reports he followed up with urologist and they told him everything was normal.  Concern for epididymitis, torsion or possible hydrocele or varicocele.  Also concern for urinary transient related to prostate issues versus UTI.  Also with patient shortness of breath that started today concerning for COPD exacerbation versus heart failure.  Patient on 2 L of oxygen due to work of breathing but sats are normal.  He reports that has improved to shortness of breath.  Will give pain control.  7:31  PM I independent reviewed and interpreted patient's labs and EKG.  EKG shows a paced rhythm which is unchanged from prior.  CBC without acute findings, CMP with stable creatinine 1.34 and electrolytes that are baseline, UA does show moderate leukocytes, greater than 50 white blood cells and few bacteria, BNP is elevated at 2000 which appears to be his baseline.  I have independently visualized and interpreted pt's images today.  Chest x-ray with cardiomegaly but no findings of fluid overload today, testicular ultrasound shows increased vascularity but no evidence of abscess.  Radiology reports heterogeneous and hypervascular right testicle and epididymis compatible with epididymoorchitis.  Also moderate right and small left hydroceles and a simple cyst within the midline scrotal soft tissue.  On repeat evaluation after pain medication, Foley catheter and nebulizer patient is feeling much better.  Breath sounds are clear and breathing is less than 20 times per minute.  No longer requiring oxygen.  Heart rate is improved.  At this time feel that patient can be treated for epididymoorchitis and be sent home with a Foley catheter.  He does have palliative care that comes in and helps him at home.  He reports he has had a catheter before and feels comfortable managing it.  He denies any sexual activity for quite some time.  Will treat with pain medication and antibiotics.  Urology follow-up.          Final Clinical Impression(s) / ED Diagnoses Final diagnoses:  Urinary retention  Epididymoorchitis    Rx / DC Orders  ED Discharge Orders          Ordered    levofloxacin (LEVAQUIN) 750 MG tablet  Daily        03/06/23 1930    HYDROcodone-acetaminophen (NORCO/VICODIN) 5-325 MG tablet  Every 6 hours PRN        03/06/23 1930              Gwyneth Sprout, MD 03/06/23 1931

## 2023-03-06 NOTE — ED Triage Notes (Signed)
Pt reports left flank pain starting Monday . Pt also reports some blood in urine. Pt having trouble starting urine stream. Pt reports pain in right testicle with swelling pt reports pain has been constant since Monday. Pt reports shortness of breath. Pt paced on the monitor.  Ems vitals 158/100 Pulse 104 Rr 22 Spo2 94% on ra Cbg 126 Temp 98.7

## 2023-03-06 NOTE — Discharge Instructions (Addendum)
You have an infection of your testicle today and you need to be on antibiotics daily for the next week and a half.  If you start having worsening pain and swelling, fever, vomiting or other concerns return to the emergency room.  You will need to follow-up with your doctor or the urologist to have the catheter removed.

## 2023-03-10 ENCOUNTER — Emergency Department (HOSPITAL_COMMUNITY): Payer: Medicare HMO

## 2023-03-10 ENCOUNTER — Other Ambulatory Visit: Payer: Self-pay

## 2023-03-10 ENCOUNTER — Encounter (HOSPITAL_COMMUNITY): Payer: Self-pay

## 2023-03-10 ENCOUNTER — Inpatient Hospital Stay (HOSPITAL_COMMUNITY)
Admission: EM | Admit: 2023-03-10 | Discharge: 2023-03-12 | DRG: 312 | Disposition: A | Discharge status: Home Health | Payer: Medicare HMO | Attending: Family Medicine | Admitting: Family Medicine

## 2023-03-10 DIAGNOSIS — Z79899 Other long term (current) drug therapy: Secondary | ICD-10-CM

## 2023-03-10 DIAGNOSIS — I951 Orthostatic hypotension: Principal | ICD-10-CM | POA: Diagnosis present

## 2023-03-10 DIAGNOSIS — I130 Hypertensive heart and chronic kidney disease with heart failure and stage 1 through stage 4 chronic kidney disease, or unspecified chronic kidney disease: Secondary | ICD-10-CM | POA: Diagnosis present

## 2023-03-10 DIAGNOSIS — E861 Hypovolemia: Secondary | ICD-10-CM | POA: Diagnosis present

## 2023-03-10 DIAGNOSIS — N453 Epididymo-orchitis: Secondary | ICD-10-CM

## 2023-03-10 DIAGNOSIS — R54 Age-related physical debility: Secondary | ICD-10-CM | POA: Diagnosis present

## 2023-03-10 DIAGNOSIS — N1832 Chronic kidney disease, stage 3b: Secondary | ICD-10-CM | POA: Diagnosis not present

## 2023-03-10 DIAGNOSIS — N179 Acute kidney failure, unspecified: Secondary | ICD-10-CM | POA: Diagnosis present

## 2023-03-10 DIAGNOSIS — I5022 Chronic systolic (congestive) heart failure: Secondary | ICD-10-CM | POA: Diagnosis present

## 2023-03-10 DIAGNOSIS — N1831 Chronic kidney disease, stage 3a: Secondary | ICD-10-CM | POA: Diagnosis present

## 2023-03-10 DIAGNOSIS — J449 Chronic obstructive pulmonary disease, unspecified: Secondary | ICD-10-CM | POA: Diagnosis present

## 2023-03-10 DIAGNOSIS — F17210 Nicotine dependence, cigarettes, uncomplicated: Secondary | ICD-10-CM | POA: Diagnosis present

## 2023-03-10 DIAGNOSIS — I472 Ventricular tachycardia, unspecified: Secondary | ICD-10-CM | POA: Diagnosis present

## 2023-03-10 DIAGNOSIS — E119 Type 2 diabetes mellitus without complications: Secondary | ICD-10-CM | POA: Diagnosis present

## 2023-03-10 DIAGNOSIS — Z8249 Family history of ischemic heart disease and other diseases of the circulatory system: Secondary | ICD-10-CM | POA: Diagnosis not present

## 2023-03-10 DIAGNOSIS — N183 Chronic kidney disease, stage 3 unspecified: Secondary | ICD-10-CM | POA: Diagnosis present

## 2023-03-10 DIAGNOSIS — Z95810 Presence of automatic (implantable) cardiac defibrillator: Secondary | ICD-10-CM

## 2023-03-10 DIAGNOSIS — I2510 Atherosclerotic heart disease of native coronary artery without angina pectoris: Secondary | ICD-10-CM | POA: Diagnosis present

## 2023-03-10 DIAGNOSIS — F1910 Other psychoactive substance abuse, uncomplicated: Secondary | ICD-10-CM | POA: Diagnosis not present

## 2023-03-10 DIAGNOSIS — Z7982 Long term (current) use of aspirin: Secondary | ICD-10-CM | POA: Diagnosis not present

## 2023-03-10 DIAGNOSIS — B0229 Other postherpetic nervous system involvement: Secondary | ICD-10-CM | POA: Diagnosis not present

## 2023-03-10 DIAGNOSIS — I5020 Unspecified systolic (congestive) heart failure: Secondary | ICD-10-CM | POA: Diagnosis not present

## 2023-03-10 DIAGNOSIS — E869 Volume depletion, unspecified: Secondary | ICD-10-CM

## 2023-03-10 DIAGNOSIS — J42 Unspecified chronic bronchitis: Secondary | ICD-10-CM | POA: Diagnosis not present

## 2023-03-10 DIAGNOSIS — R570 Cardiogenic shock: Secondary | ICD-10-CM | POA: Diagnosis present

## 2023-03-10 DIAGNOSIS — B029 Zoster without complications: Secondary | ICD-10-CM | POA: Diagnosis present

## 2023-03-10 DIAGNOSIS — I428 Other cardiomyopathies: Secondary | ICD-10-CM | POA: Diagnosis present

## 2023-03-10 DIAGNOSIS — I4729 Other ventricular tachycardia: Secondary | ICD-10-CM | POA: Diagnosis present

## 2023-03-10 DIAGNOSIS — E1122 Type 2 diabetes mellitus with diabetic chronic kidney disease: Secondary | ICD-10-CM | POA: Diagnosis present

## 2023-03-10 DIAGNOSIS — Z823 Family history of stroke: Secondary | ICD-10-CM | POA: Diagnosis not present

## 2023-03-10 DIAGNOSIS — I10 Essential (primary) hypertension: Secondary | ICD-10-CM | POA: Diagnosis not present

## 2023-03-10 DIAGNOSIS — E876 Hypokalemia: Secondary | ICD-10-CM | POA: Diagnosis not present

## 2023-03-10 DIAGNOSIS — Z7984 Long term (current) use of oral hypoglycemic drugs: Secondary | ICD-10-CM | POA: Diagnosis not present

## 2023-03-10 DIAGNOSIS — F1210 Cannabis abuse, uncomplicated: Secondary | ICD-10-CM | POA: Diagnosis present

## 2023-03-10 DIAGNOSIS — T502X5A Adverse effect of carbonic-anhydrase inhibitors, benzothiadiazides and other diuretics, initial encounter: Secondary | ICD-10-CM | POA: Diagnosis present

## 2023-03-10 DIAGNOSIS — J441 Chronic obstructive pulmonary disease with (acute) exacerbation: Secondary | ICD-10-CM | POA: Diagnosis present

## 2023-03-10 DIAGNOSIS — R55 Syncope and collapse: Secondary | ICD-10-CM | POA: Diagnosis not present

## 2023-03-10 DIAGNOSIS — I959 Hypotension, unspecified: Secondary | ICD-10-CM | POA: Diagnosis not present

## 2023-03-10 LAB — URINALYSIS, W/ REFLEX TO CULTURE (INFECTION SUSPECTED)
Bacteria, UA: NONE SEEN
Bilirubin Urine: NEGATIVE
Glucose, UA: 150 mg/dL — AB
Hgb urine dipstick: NEGATIVE
Ketones, ur: NEGATIVE mg/dL
Leukocytes,Ua: NEGATIVE
Nitrite: NEGATIVE
Protein, ur: NEGATIVE mg/dL
Specific Gravity, Urine: 1.004 — ABNORMAL LOW (ref 1.005–1.030)
pH: 6 (ref 5.0–8.0)

## 2023-03-10 LAB — COMPREHENSIVE METABOLIC PANEL
ALT: 14 U/L (ref 0–44)
AST: 20 U/L (ref 15–41)
Albumin: 3 g/dL — ABNORMAL LOW (ref 3.5–5.0)
Alkaline Phosphatase: 121 U/L (ref 38–126)
Anion gap: 11 (ref 5–15)
BUN: 35 mg/dL — ABNORMAL HIGH (ref 8–23)
CO2: 25 mmol/L (ref 22–32)
Calcium: 8.8 mg/dL — ABNORMAL LOW (ref 8.9–10.3)
Chloride: 96 mmol/L — ABNORMAL LOW (ref 98–111)
Creatinine, Ser: 2.3 mg/dL — ABNORMAL HIGH (ref 0.61–1.24)
GFR, Estimated: 30 mL/min — ABNORMAL LOW (ref >60)
Glucose, Bld: 110 mg/dL — ABNORMAL HIGH (ref 70–99)
Potassium: 3.8 mmol/L (ref 3.5–5.1)
Sodium: 132 mmol/L — ABNORMAL LOW (ref 135–145)
Total Bilirubin: 0.6 mg/dL (ref 0.3–1.2)
Total Protein: 7.9 g/dL (ref 6.5–8.1)

## 2023-03-10 LAB — CBC WITH DIFFERENTIAL/PLATELET
Abs Immature Granulocytes: 0.04 10*3/uL (ref 0.00–0.07)
Basophils Absolute: 0 10*3/uL (ref 0.0–0.1)
Basophils Relative: 0 %
Eosinophils Absolute: 0 10*3/uL (ref 0.0–0.5)
Eosinophils Relative: 0 %
HCT: 48.8 % (ref 39.0–52.0)
Hemoglobin: 15.3 g/dL (ref 13.0–17.0)
Immature Granulocytes: 1 %
Lymphocytes Relative: 8 %
Lymphs Abs: 0.7 10*3/uL (ref 0.7–4.0)
MCH: 25.5 pg — ABNORMAL LOW (ref 26.0–34.0)
MCHC: 31.4 g/dL (ref 30.0–36.0)
MCV: 81.5 fL (ref 80.0–100.0)
Monocytes Absolute: 1.2 10*3/uL — ABNORMAL HIGH (ref 0.1–1.0)
Monocytes Relative: 13 %
Neutro Abs: 6.8 10*3/uL (ref 1.7–7.7)
Neutrophils Relative %: 78 %
Platelets: 211 10*3/uL (ref 150–400)
RBC: 5.99 MIL/uL — ABNORMAL HIGH (ref 4.22–5.81)
RDW: 15.9 % — ABNORMAL HIGH (ref 11.5–15.5)
WBC: 8.7 10*3/uL (ref 4.0–10.5)
nRBC: 0 % (ref 0.0–0.2)

## 2023-03-10 LAB — CREATININE, URINE, RANDOM: Creatinine, Urine: 23 mg/dL

## 2023-03-10 LAB — TROPONIN I (HIGH SENSITIVITY)
Troponin I (High Sensitivity): 19 ng/L — ABNORMAL HIGH (ref <18)
Troponin I (High Sensitivity): 20 ng/L — ABNORMAL HIGH (ref <18)

## 2023-03-10 LAB — APTT: aPTT: 30 seconds (ref 24–36)

## 2023-03-10 LAB — SODIUM, URINE, RANDOM: Sodium, Ur: 95 mmol/L

## 2023-03-10 LAB — MAGNESIUM: Magnesium: 1.8 mg/dL (ref 1.7–2.4)

## 2023-03-10 LAB — RAPID URINE DRUG SCREEN, HOSP PERFORMED
Amphetamines: NOT DETECTED
Barbiturates: NOT DETECTED
Benzodiazepines: NOT DETECTED
Cocaine: NOT DETECTED
Opiates: POSITIVE — AB
Tetrahydrocannabinol: POSITIVE — AB

## 2023-03-10 LAB — PROTIME-INR
INR: 1.1 (ref 0.8–1.2)
Prothrombin Time: 14.6 seconds (ref 11.4–15.2)

## 2023-03-10 LAB — I-STAT CG4 LACTIC ACID, ED
Lactic Acid, Venous: 1.4 mmol/L (ref 0.5–1.9)
Lactic Acid, Venous: 1.5 mmol/L (ref 0.5–1.9)

## 2023-03-10 MED ORDER — ENOXAPARIN SODIUM 40 MG/0.4ML IJ SOSY
40.0000 mg | PREFILLED_SYRINGE | INTRAMUSCULAR | Status: DC
  Administered 2023-03-11 (×2): 40 mg via SUBCUTANEOUS
  Filled 2023-03-10 (×2): qty 0.4

## 2023-03-10 MED ORDER — LACTATED RINGERS IV BOLUS
500.0000 mL | Freq: Once | INTRAVENOUS | Status: AC
  Administered 2023-03-10: 500 mL via INTRAVENOUS

## 2023-03-10 MED ORDER — INSULIN ASPART 100 UNIT/ML IJ SOLN: 0.0000 [IU] | Freq: Three times a day (TID) | INTRAMUSCULAR | Status: DC

## 2023-03-10 MED ORDER — ONDANSETRON HCL 4 MG/2ML IJ SOLN: 4.0000 mg | Freq: Four times a day (QID) | INTRAMUSCULAR | Status: DC | PRN

## 2023-03-10 MED ORDER — LACTATED RINGERS IV BOLUS
1000.0000 mL | Freq: Once | INTRAVENOUS | Status: AC
  Administered 2023-03-10: 1000 mL via INTRAVENOUS

## 2023-03-10 MED ORDER — ONDANSETRON HCL 4 MG PO TABS: 4.0000 mg | ORAL_TABLET | Freq: Four times a day (QID) | ORAL | Status: DC | PRN

## 2023-03-10 MED ORDER — LACTATED RINGERS IV BOLUS (SEPSIS)
1000.0000 mL | Freq: Once | INTRAVENOUS | Status: AC
  Administered 2023-03-10: 1000 mL via INTRAVENOUS

## 2023-03-10 MED ORDER — LACTATED RINGERS IV SOLN: INTRAVENOUS | Status: AC

## 2023-03-10 NOTE — Assessment & Plan Note (Signed)
Noted transient syncopal episode in the setting of  systolic pressures in the 60s 09W. Suspect symptomatic hypotension Nonfocal neuroexam CT head within normal limits Will continue to hydrate patient Hold offending medications including antihypertensive regimen and diuretic Follow

## 2023-03-10 NOTE — ED Notes (Signed)
Interrogating icd

## 2023-03-10 NOTE — ED Provider Notes (Signed)
[place] EMERGENCY DEPARTMENT AT [facility] Provider Note   CSN: [id number] Arrival date & time: 03/10/23  1050     History {Add pertinent medical, surgical, social history, OB history to HPI:1} Chief Complaint  Patient presents with   Hypotension   Loss of Consciousness    Edward Mullen is a 67 y.o. male.  HPI 67 year old male history of hypertension, nonobstructive cardiomyopathy, ventricular tachycardia, CKD, recently had Foley catheter placed presents today from PCP office with hypotension.  Patient has had lightheadedness over the past several days.  States that he did pass out yesterday and struck his head.  He is not on any blood thinners.  He reports he has felt very lightheaded today.  He has had some decreased p.o. intake but no vomiting or diarrhea.  Patient was seen 4 days ago for urinary retention and had Foley catheter placed.    Home Medications Prior to Admission medications   Medication Sig Start Date End Date Taking? Authorizing Provider  acetaminophen (TYLENOL) 500 MG tablet Take 2 tablets (1,000 mg total) by mouth every 8 (eight) hours as needed. 12/16/22   Rocky Morel, DO  albuterol (PROVENTIL) (2.5 MG/3ML) 0.083% nebulizer solution USE ONE VIAL (2.5 MG TOTAL) BY NEBULIZATION EVERY 6 (SIX) HOURS AS NEEDED FOR SHORTNESS OF BREATH OR WHEEZING. 10/29/22   Morene Crocker, MD  albuterol (VENTOLIN HFA) 108 (90 Base) MCG/ACT inhaler INHALE 1 TO 2 PUFFS BY MOUTH EVERY 6 HOURS AS NEEDED FOR WHEEZING OR SHORTNESS OF BREATH 10/29/22   Morene Crocker, MD  amiodarone (PACERONE) 200 MG tablet Take 1 tablet (200 mg total) by mouth daily. 11/26/22   Zannie Cove, MD  ASPIRIN LOW DOSE 81 MG tablet TAKE 1 TABLET (81 MG TOTAL) BY MOUTH DAILY. SWALLOW WHOLE(AM) 01/27/23   [place], Anderson Malta, FNP  bismuth subsalicylate (PEPTO BISMOL) 262 MG/15ML suspension Take 30 mLs by mouth every 6 (six) hours as needed for indigestion.    [provider]   BREO ELLIPTA 100-25 MCG/ACT AEPB Inhale 1 puff into the lungs daily. 08/02/22 08/02/23  Morene Crocker, MD  carvedilol (COREG) 3.125 MG tablet Take 1 tablet (3.125 mg total) by mouth 2 (two) times daily. 01/29/22 01/29/23  Jacklynn Ganong, FNP  Cholecalciferol (VITAMIN D3) 50 MCG (2000 UT) TABS Take 1 tablet by mouth daily.    [provider]  empagliflozin (JARDIANCE) 10 MG TABS tablet Take 1 tablet (10 mg total) by mouth daily. 11/26/22   Zannie Cove, MD  gabapentin (NEURONTIN) 100 MG capsule Take 2 capsules (200 mg total) by mouth 3 (three) times daily. 12/16/22 01/15/23  Rocky Morel, DO  HYDROcodone-acetaminophen (NORCO/VICODIN) 5-325 MG tablet Take 1 tablet by mouth every 6 (six) hours as needed. 03/06/23   Gwyneth Sprout, MD  levofloxacin (LEVAQUIN) 750 MG tablet Take 1 tablet (750 mg total) by mouth daily. 03/06/23   Gwyneth Sprout, MD  mexiletine (MEXITIL) 150 MG capsule Take 1 capsule (150 mg total) by mouth 2 (two) times daily. 07/31/22   Duke Salvia, MD  patiromer (VELTASSA) 8.4 g packet Take 1 packet (8.4 g total) by mouth daily. 12/03/22   Adron Bene, MD  rosuvastatin (CRESTOR) 5 MG tablet TAKE 1 TABLET (5 MG TOTAL) BY MOUTH DAILY. 12/26/20   Laurey Morale, MD  tamsulosin (FLOMAX) 0.4 MG CAPS capsule TAKE ONE CAPSULE BY MOUTH ONCE DAILY AFTER SUPPER 02/20/21   Belva Agee, MD  torsemide (DEMADEX) 20 MG tablet Take 1 tablet (20 mg total) by mouth daily. 11/22/22  Zannie Cove, MD  umeclidinium bromide (INCRUSE ELLIPTA) 62.5 MCG/ACT AEPB Inhale 1 puff into the lungs daily. 06/17/22   Masters, Katie, DO  zolpidem (AMBIEN) 10 MG tablet Take 1 tablet (10 mg total) by mouth at bedtime as needed for sleep. 09/16/22 01/03/23        Allergies    Patient has no known allergies.    Review of Systems   Review of Systems  Physical Exam Updated Vital Signs BP (!) 81/61   Pulse 62   Temp 97.6 F (36.4 C)   Resp (!) 22   Ht 1.829 m (6')   Wt  81.6 kg   SpO2 94%   BMI 24.41 kg/m  Physical Exam Vitals and nursing note reviewed.  HENT:     Head: Normocephalic.     Right Ear: External ear normal.     Left Ear: External ear normal.     Nose: Nose normal.     Mouth/Throat:     Mouth: Mucous membranes are dry.  Eyes:     Pupils: Pupils are equal, round, and reactive to light.  Cardiovascular:     Rate and Rhythm: Normal rate.     Pulses: Normal pulses.  Pulmonary:     Effort: Pulmonary effort is normal.  Abdominal:     General: Abdomen is flat.     Palpations: Abdomen is soft.  Musculoskeletal:        General: Normal range of motion.     Cervical back: Normal range of motion.  Skin:    General: Skin is warm.     Capillary Refill: Capillary refill takes less than 2 seconds.  Neurological:     General: No focal deficit present.     Mental Status: He is alert.  Psychiatric:        Mood and Affect: Mood normal.     ED Results / Procedures / Treatments   Labs (all labs ordered are listed, but only abnormal results are displayed) Labs Reviewed  COMPREHENSIVE METABOLIC PANEL - Abnormal; Notable for the following components:      Result Value   Sodium 132 (*)    Chloride 96 (*)    Glucose, Bld 110 (*)    BUN 35 (*)    Creatinine, Ser 2.30 (*)    Calcium 8.8 (*)    Albumin 3.0 (*)    GFR, Estimated 30 (*)    All other components within normal limits  CBC WITH DIFFERENTIAL/PLATELET - Abnormal; Notable for the following components:   RBC 5.99 (*)    MCH 25.5 (*)    RDW 15.9 (*)    Monocytes Absolute 1.2 (*)    All other components within normal limits  URINALYSIS, W/ REFLEX TO CULTURE (INFECTION SUSPECTED) - Abnormal; Notable for the following components:   Color, Urine COLORLESS (*)    Specific Gravity, Urine 1.004 (*)    Glucose, UA 150 (*)    All other components within normal limits  CULTURE, BLOOD (ROUTINE X 2)  CULTURE, BLOOD (ROUTINE X 2)  PROTIME-INR  APTT  I-STAT CG4 LACTIC ACID, ED  I-STAT CG4  LACTIC ACID, ED    EKG EKG Interpretation Date/Time:  Monday March 10 2023 11:04:45 EDT Ventricular Rate:  67 PR Interval:  183 QRS Duration:  185 QT Interval:  517 QTC Calculation: 546 R Axis:   252  Text Interpretation: VENTRICULAR PACED RHYTHM Confirmed by Margarita Grizzle [id number]) on 03/10/2023 11:07:52 AM  Radiology CT Head Wo Contrast  Result Date: 03/10/2023  CLINICAL DATA:  Head trauma. EXAM: CT HEAD WITHOUT CONTRAST TECHNIQUE: Contiguous axial images were obtained from the base of the skull through the vertex without intravenous contrast. RADIATION DOSE REDUCTION: This exam was performed according to the departmental dose-optimization program which includes automated exposure control, adjustment of the mA and/or kV according to patient size and/or use of iterative reconstruction technique. COMPARISON:  CT head dated November 17, 2022. FINDINGS: Brain: No evidence of acute infarction, hemorrhage, hydrocephalus, extra-axial collection or mass lesion/mass effect. Stable chronic microvascular ischemic changes. Vascular: Calcified atherosclerosis at the skull base. No hyperdense vessel. Skull: Normal. Negative for fracture or focal lesion. Sinuses/Orbits: No acute finding. Other: None. IMPRESSION: 1. No acute intracranial abnormality. 2. Stable chronic microvascular ischemic changes. Electronically Signed   By: Obie Dredge M.D.   On: 03/10/2023 13:04   DG Chest Port 1 View  Result Date: 03/10/2023 CLINICAL DATA:  Possible sepsis. EXAM: PORTABLE CHEST 1 VIEW COMPARISON:  Chest x-Rishav Rockefeller dated March 06, 2023. FINDINGS: Unchanged left chest wall pacemaker and cardiomegaly. No focal consolidation, pleural effusion, or pneumothorax. No acute osseous abnormality. IMPRESSION: No active disease. Electronically Signed   By: Obie Dredge M.D.   On: 03/10/2023 12:29    Procedures Procedures  {Document cardiac monitor, telemetry assessment procedure when appropriate:1}  Medications Ordered in  ED Medications  lactated ringers bolus 1,000 mL (0 mLs Intravenous Stopped 03/10/23 1227)  lactated ringers bolus 1,000 mL (1,000 mLs Intravenous New Bag/Given 03/10/23 1200)    ED Course/ Medical Decision Making/ A&P Clinical Course as of 03/10/23 1330  Mon Mar 10, 2023  1234 CBC reviewed interpreted significant for normal white blood cell count, normal hemoglobin, normal platelets [DR]  1234 Lactic acid reviewed interpreted within normal limits [DR]    Clinical Course User Index [DR] Margarita Grizzle, MD   {   Click here for ABCD2, HEART and other calculatorsREFRESH Note before signing :1}                          Medical Decision Making Amount and/or Complexity of Data Reviewed Labs: ordered. Radiology: ordered. ECG/medicine tests: ordered.   67 year old male with multiple comorbidities presents today with syncope and hypotension.  Recent urinary retention and Foley catheter placed.  Some decreased p.o. intake Differential diagnosis is broad and includes but is not limited to volume depletion, arrhythmia, cardiac ischemia, sepsis and other acute infections, anemia, electrolyte abnormalities EKG obtained and is paced rhythm and appears a normal rate CBC is obtained and no evidence of significant anemia is noted with hemoglobin of 15 and normal platelets no leukocytosis Urinalysis is obtained and shows 0-5 white blood cells and no bacteria Urine culture obtained 2 days ago is negative Complete metabolic panel is reviewed ReACT that is increased to 2.3 from baseline of 1.3 Patient has received 2 L of normal saline BP has increased to 80/60 Patient is awake and alert Lactic acid is normal CT head done due to syncope and striking head that shows no acute abnormalities X-rays reviewed and shows no evidence acute abnormality  Plan admission to this service for ongoing hydration, evaluation, and treatment Discussed with Dr. Alvester Morin Will consult cardiology- reviewed admission from 4/24  with out of hospital wu  cardiac arrest. During that admission echo with ef 25-30%  11/17/22- out of hospital arrest with persistently paced rhyth, pinpoint pupils and ho cocaine use,   Will obtain uds Troponin added  First troponin slightly elevated at 19  Discussed with  cardmaster and cardiology will see. {Document critical care time when appropriate:1} {Document review of labs and clinical decision tools ie heart score, Chads2Vasc2 etc:1}  {Document your independent review of radiology images, and any outside records:1} {Document your discussion with family members, caretakers, and with consultants:1} {Document social determinants of health affecting pt's care:1} {Document your decision making why or why not admission, treatments were needed:1} Final Clinical Impression(s) / ED Diagnoses Final diagnoses:  None    Rx / DC Orders ED Discharge Orders     None

## 2023-03-10 NOTE — Consult Note (Signed)
   NAME:  Edward Mullen, MRN:  [id number], DOB:  [birth year], LOS: 0 ADMISSION DATE:  03/10/2023, CONSULTATION DATE: 03/10/2023 REFERRING MD:  Rosalia Hammers - [facility], CHIEF COMPLAINT: Hypotension  History of Present Illness:  67 year old man who presented with an episode of presyncope yesterday.  Seen in PCP office today and found to be hypotensive to 78/54 and sent to the emergency department.  History of nonischemic cardiomyopathy with EF 20 to 25% with biventricular ICD in place.  Recently admitted in April for PEA VT arrest.  Has provide problems with urinary retention since then and had a Foley catheter placed in the ED last week.  Had some mild shortness of breath yesterday but is otherwise denies chest pain.  Appetite and oral intake have been good. Pertinent  Medical History   Past Medical History:  Diagnosis Date   Arthritis    "feels like it in my legs" (09/20/2014)   CAD (coronary artery disease)    Chronic kidney disease, stage 3a (HCC)    Chronic systolic CHF (congestive heart failure) (HCC)    Cocaine abuse (HCC)    Hypertension    NICM (nonischemic cardiomyopathy) (HCC)    NSVT (nonsustained ventricular tachycardia) (HCC) 09/06/2016   pre diabetes    patient denies    Significant Hospital Events: Including procedures, antibiotic start and stop dates in addition to other pertinent events   N/A  Interim History / Subjective:  Feels better after fluid administration.  Still feels thirsty.  Objective   Blood pressure 90/65, pulse 66, temperature 98 F (36.7 C), resp. rate (!) 23, height 6' (1.829 m), weight 81.6 kg, SpO2 96%.        Intake/Output Summary (Last 24 hours) at 03/10/2023 1656 Last data filed at 03/10/2023 1347 Gross per 24 hour  Intake --  Output 1000 ml  Net -1000 ml   Filed Weights   03/10/23 1106  Weight: 81.6 kg    Examination: General: Frail-appearing man in no distress. HENT: Dry mucous membranes Lungs: Clear chest Cardiovascular: JVP not  elevated.  Heart sounds unremarkable.  No peripheral edema. Abdomen: Soft abdomen.  No distention Extremities: Decreased muscle bulk.  . Neuro: No focal deficits.  Sensorium clear GU: Foley catheter in situ with clear urine.  Ancillary tests personally reviewed  Lactate negative x 2. Creatinine has gone up from 1.3 to 2.3 Mild hyponatremia at 132 BNP was markedly elevated 4 days ago. Troponin marginally elevated at 19 No leukocytosis.  Urinalysis negative today.  Assessment & Plan:   Hypotension due to hypovolemia from overdiuresis based on clinical examination. Negative urinalysis and normal white count argue against sepsis. Known cardiomyopathy with decreased ejection fraction.  BNP elevated but consistent with prior results.  May be at baseline.  Plan:  -Gentle rehydration overnight. -Patient is clinically feeling better.  Blood pressure is improving and lactate is negative.  This is reassuring.  Patient can be admitted to the hospitalist.  Lynnell Catalan, MD [facility] ICU Physician [facility]  Pager: [phone] Or Epic Secure Chat After hours: [phone].  03/10/2023, 4:56 PM

## 2023-03-10 NOTE — Assessment & Plan Note (Signed)
History of cardiogenic shock and PEA arrest during April 2024 admission No active chest pain though some dizziness and?  Presyncopal episode today EKG with paced rhythm Troponin pending Noted hypotension with systolic pressures in the 80s-though appears to be clinically dry Follow-up cardiology as well as pulmonology recommendations Follow

## 2023-03-10 NOTE — H&P (Signed)
History and Physical    Patient: Edward Mullen JJO:[id number] DOB: 1956/07/11 DOA: 03/10/2023 DOS: the patient was seen and examined on 03/10/2023 PCP: Loura Back, NP  Patient coming from:  doctors office   Chief Complaint:  Chief Complaint  Patient presents with   Hypotension   Loss of Consciousness   HPI: Edward Mullen is a 67 y.o. male with medical history significant of coronary artery disease, stage III CKD, HFrEF, polysubstance abuse, hypertension, history of cardiogenic shock and PEA arrest April 2024 presenting with hypotension, syncope.  Patient noted to have been seen by his PCP today.  Has systolic pressures in the 70s while at the office.  Patient reports having had a general syncopal episode yesterday.  Positive generalized weakness.  No focal hemiparesis or confusion.  Noted admission April of this year with noted PEA arrest and cardiogenic shock.  Patient reports compliance with medication regimen including diuretic.  Positive decreased p.o. intake.  No chest pain or shortness of breath.  No nausea or vomiting.  Baseline polysubstance abuse.  Does admit to marijuana use.  Denies any other illicit drug use.  No reported alcohol or tobacco use. Presented to the ER afebrile, systolic pressures in the 70s to 80s.  White count 8.7, hemoglobin 15.3, creatinine 2.3, lactate 1.4.  Troponin 19.  EKG with paced rhythm.  Chest x-ray and CT head grossly within normal notes. Review of Systems: As mentioned in the history of present illness. All other systems reviewed and are negative. Past Medical History:  Diagnosis Date   Arthritis    "feels like it in my legs" (09/20/2014)   CAD (coronary artery disease)    Chronic kidney disease, stage 3a (HCC)    Chronic systolic CHF (congestive heart failure) (HCC)    Cocaine abuse (HCC)    Hypertension    NICM (nonischemic cardiomyopathy) (HCC)    NSVT (nonsustained ventricular tachycardia) (HCC) 09/06/2016   pre diabetes    patient  denies   Past Surgical History:  Procedure Laterality Date   BIV ICD INSERTION CRT-D N/A 07/17/2022   Procedure: BIV ICD INSERTION CRT-D;  Surgeon: Duke Salvia, MD;  Location: [facility] INVASIVE CV LAB;  Service: Cardiovascular;  Laterality: N/A;   CARDIAC CATHETERIZATION N/A 08/27/2016   Procedure: Right/Left Heart Cath and Coronary Angiography;  Surgeon: Laurey Morale, MD;  Location: [facility] INVASIVE CV LAB;  Service: Cardiovascular;  Laterality: N/A;   COLONOSCOPY     CYSTECTOMY Right    "back of my shoulder"   RIGHT/LEFT HEART CATH AND CORONARY ANGIOGRAPHY N/A 12/04/2020   Procedure: RIGHT/LEFT HEART CATH AND CORONARY ANGIOGRAPHY;  Surgeon: Laurey Morale, MD;  Location: [facility] INVASIVE CV LAB;  Service: Cardiovascular;  Laterality: N/A;   RIGHT/LEFT HEART CATH AND CORONARY ANGIOGRAPHY N/A 10/10/2021   Procedure: RIGHT/LEFT HEART CATH AND CORONARY ANGIOGRAPHY;  Surgeon: Laurey Morale, MD;  Location: [facility] INVASIVE CV LAB;  Service: Cardiovascular;  Laterality: N/A;   TONSILLECTOMY     Social History:  reports that he has been smoking cigarettes. He has a 4.8 pack-year smoking history. He has never used smokeless tobacco. He reports current alcohol use of about 1.0 standard drink of alcohol per week. He reports current drug use. Drug: Marijuana.  No Known Allergies  Family History  Problem Relation Age of Onset   Stroke Mother 31   Heart disease Father 34   Colon cancer Neg Hx    Colon polyps Neg Hx    Esophageal cancer Neg Hx  Stomach cancer Neg Hx    Rectal cancer Neg Hx     Prior to Admission medications   Medication Sig Start Date End Date Taking? Authorizing Provider  acetaminophen (TYLENOL) 500 MG tablet Take 2 tablets (1,000 mg total) by mouth every 8 (eight) hours as needed. 12/16/22   Rocky Morel, DO  albuterol (PROVENTIL) (2.5 MG/3ML) 0.083% nebulizer solution USE ONE VIAL (2.5 MG TOTAL) BY NEBULIZATION EVERY 6 (SIX) HOURS AS NEEDED FOR SHORTNESS OF BREATH OR WHEEZING. 10/29/22    Morene Crocker, MD  albuterol (VENTOLIN HFA) 108 (90 Base) MCG/ACT inhaler INHALE 1 TO 2 PUFFS BY MOUTH EVERY 6 HOURS AS NEEDED FOR WHEEZING OR SHORTNESS OF BREATH 10/29/22   Morene Crocker, MD  amiodarone (PACERONE) 200 MG tablet Take 1 tablet (200 mg total) by mouth daily. 11/26/22   Zannie Cove, MD  ASPIRIN LOW DOSE 81 MG tablet TAKE 1 TABLET (81 MG TOTAL) BY MOUTH DAILY. SWALLOW WHOLE(AM) 01/27/23   Milford, Anderson Malta, FNP  bismuth subsalicylate (PEPTO BISMOL) 262 MG/15ML suspension Take 30 mLs by mouth every 6 (six) hours as needed for indigestion.    [provider]  BREO ELLIPTA 100-25 MCG/ACT AEPB Inhale 1 puff into the lungs daily. 08/02/22 08/02/23  Morene Crocker, MD  carvedilol (COREG) 3.125 MG tablet Take 1 tablet (3.125 mg total) by mouth 2 (two) times daily. 01/29/22 01/29/23  Jacklynn Ganong, FNP  Cholecalciferol (VITAMIN D3) 50 MCG (2000 UT) TABS Take 1 tablet by mouth daily.    [provider]  empagliflozin (JARDIANCE) 10 MG TABS tablet Take 1 tablet (10 mg total) by mouth daily. 11/26/22   Zannie Cove, MD  gabapentin (NEURONTIN) 100 MG capsule Take 2 capsules (200 mg total) by mouth 3 (three) times daily. 12/16/22 01/15/23  Rocky Morel, DO  HYDROcodone-acetaminophen (NORCO/VICODIN) 5-325 MG tablet Take 1 tablet by mouth every 6 (six) hours as needed. 03/06/23   Gwyneth Sprout, MD  levofloxacin (LEVAQUIN) 750 MG tablet Take 1 tablet (750 mg total) by mouth daily. 03/06/23   Gwyneth Sprout, MD  mexiletine (MEXITIL) 150 MG capsule Take 1 capsule (150 mg total) by mouth 2 (two) times daily. 07/31/22   Duke Salvia, MD  patiromer (VELTASSA) 8.4 g packet Take 1 packet (8.4 g total) by mouth daily. 12/03/22   Adron Bene, MD  rosuvastatin (CRESTOR) 5 MG tablet TAKE 1 TABLET (5 MG TOTAL) BY MOUTH DAILY. 12/26/20   Laurey Morale, MD  tamsulosin (FLOMAX) 0.4 MG CAPS capsule TAKE ONE CAPSULE BY MOUTH ONCE DAILY AFTER SUPPER 02/20/21    Belva Agee, MD  torsemide (DEMADEX) 20 MG tablet Take 1 tablet (20 mg total) by mouth daily. 11/22/22   Zannie Cove, MD  umeclidinium bromide (INCRUSE ELLIPTA) 62.5 MCG/ACT AEPB Inhale 1 puff into the lungs daily. 06/17/22   Masters, Katie, DO  zolpidem (AMBIEN) 10 MG tablet Take 1 tablet (10 mg total) by mouth at bedtime as needed for sleep. 09/16/22 01/03/23      Physical Exam: Vitals:   03/10/23 1500 03/10/23 1530 03/10/23 1630 03/10/23 1640  BP: 91/65 (!) 84/63 90/65   Pulse: 67 66 66   Resp: 17 19 (!) 23   Temp:    98 F (36.7 C)  SpO2: 98% 97% 96%   Weight:      Height:       Physical Exam Constitutional:      Appearance: He is normal weight.  HENT:     Head: Normocephalic and atraumatic.  Mouth/Throat:     Mouth: Mucous membranes are dry.  Eyes:     Pupils: Pupils are equal, round, and reactive to light.  Cardiovascular:     Rate and Rhythm: Normal rate and regular rhythm.  Pulmonary:     Effort: Pulmonary effort is normal.  Abdominal:     General: Bowel sounds are normal.  Musculoskeletal:        General: Normal range of motion.  Skin:    General: Skin is dry.  Neurological:     General: No focal deficit present.  Psychiatric:        Mood and Affect: Mood normal.     Data Reviewed:  There are no new results to review at this time. CT Head Wo Contrast CLINICAL DATA:  Head trauma.  EXAM: CT HEAD WITHOUT CONTRAST  TECHNIQUE: Contiguous axial images were obtained from the base of the skull through the vertex without intravenous contrast.  RADIATION DOSE REDUCTION: This exam was performed according to the departmental dose-optimization program which includes automated exposure control, adjustment of the mA and/or kV according to patient size and/or use of iterative reconstruction technique.  COMPARISON:  CT head dated November 17, 2022.  FINDINGS: Brain: No evidence of acute infarction, hemorrhage, hydrocephalus, extra-axial collection or  mass lesion/mass effect. Stable chronic microvascular ischemic changes.  Vascular: Calcified atherosclerosis at the skull base. No hyperdense vessel.  Skull: Normal. Negative for fracture or focal lesion.  Sinuses/Orbits: No acute finding.  Other: None.  IMPRESSION: 1. No acute intracranial abnormality. 2. Stable chronic microvascular ischemic changes.  Electronically Signed   By: Obie Dredge M.D.   On: 03/10/2023 13:04 DG Chest Port 1 View CLINICAL DATA:  Possible sepsis.  EXAM: PORTABLE CHEST 1 VIEW  COMPARISON:  Chest x-ray dated March 06, 2023.  FINDINGS: Unchanged left chest wall pacemaker and cardiomegaly. No focal consolidation, pleural effusion, or pneumothorax. No acute osseous abnormality.  IMPRESSION: No active disease.  Electronically Signed   By: Obie Dredge M.D.   On: 03/10/2023 12:29  Lab Results  Component Value Date   WBC 8.7 03/10/2023   HGB 15.3 03/10/2023   HCT 48.8 03/10/2023   MCV 81.5 03/10/2023   PLT 211 03/10/2023   Last metabolic panel Lab Results  Component Value Date   GLUCOSE 110 (H) 03/10/2023   NA 132 (L) 03/10/2023   K 3.8 03/10/2023   CL 96 (L) 03/10/2023   CO2 25 03/10/2023   BUN 35 (H) 03/10/2023   CREATININE 2.30 (H) 03/10/2023   GFRNONAA 30 (L) 03/10/2023   CALCIUM 8.8 (L) 03/10/2023   PHOS 4.1 11/21/2022   PROT 7.9 03/10/2023   ALBUMIN 3.0 (L) 03/10/2023   LABGLOB 3.4 12/27/2021   AGRATIO 1.2 12/27/2021   BILITOT 0.6 03/10/2023   ALKPHOS 121 03/10/2023   AST 20 03/10/2023   ALT 14 03/10/2023   ANIONGAP 11 03/10/2023    Assessment and Plan: Syncope Noted transient syncopal episode in the setting of  systolic pressures in the 60s 59D. Suspect symptomatic hypotension Nonfocal neuroexam CT head within normal limits Will continue to hydrate patient Hold offending medications including antihypertensive regimen and diuretic Follow  Hypotension No systolic pressures from 60s to 80s in the setting of  baseline history of HFrEF with EF around 25-30% on 2D echo May 2024 as well as history of cardiogenic shock and cardiac arrest Clinically dry Continue IV fluid hydration Troponin pending with EKG showing paced rhythm Pending formal assessment by critical care as well as cardiology Hold  offending agents including diuretic and antihypertensive regimen for now  Cardiogenic shock [facility]) History of cardiogenic shock and PEA arrest during April 2024 admission No active chest pain though some dizziness and?  Presyncopal episode today EKG with paced rhythm Troponin pending Noted hypotension with systolic pressures in the 80s-though appears to be clinically dry Follow-up cardiology as well as pulmonology recommendations Follow  COPD (chronic obstructive pulmonary disease) (HCC) Stable from a respiratory standpoint Continue home inhalers  Type 2 diabetes mellitus with stage 3b chronic kidney disease, without long-term current use of insulin (HCC) SSI    CKD (chronic kidney disease) stage 3, GFR 30-59 ml/min (HCC) Baseline creatinine around 1.2-1.7 Creatinine 2.3 today with GFR in the 30s Clinically dry IV fluid hydration Hold offending agents Monitor Check FeUN  Formal nephrology consult if no improvement with appropriate hydration  Polysubstance abuse (HCC) Patient self reports marijuana use only UDS pending Follow  HTN (hypertension) Lower limit of normal blood pressure presentation Hold BP regimen for now Follow-up cardiology recommendations      Advance Care Planning:   Code Status: Full Code   Consults: Cardiology, PCCM   Family Communication: No family at the bedside   Severity of Illness: The appropriate patient status for this patient is INPATIENT. Inpatient status is judged to be reasonable and necessary in order to provide the required intensity of service to ensure the patient's safety. The patient's presenting symptoms, physical exam findings, and initial  radiographic and laboratory data in the context of their chronic comorbidities is felt to place them at high risk for further clinical deterioration. Furthermore, it is not anticipated that the patient will be medically stable for discharge from the hospital within 2 midnights of admission.   * I certify that at the point of admission it is my clinical judgment that the patient will require inpatient hospital care spanning beyond 2 midnights from the point of admission due to high intensity of service, high risk for further deterioration and high frequency of surveillance required.*  Author: Floydene Flock, MD 03/10/2023 5:05 PM  For on call review [email or web address].

## 2023-03-10 NOTE — Assessment & Plan Note (Signed)
Lower limit of normal blood pressure presentation Hold BP regimen for now Follow-up cardiology recommendations

## 2023-03-10 NOTE — Assessment & Plan Note (Signed)
Patient self reports marijuana use only UDS pending Follow

## 2023-03-10 NOTE — ED Triage Notes (Addendum)
Pt arrives via EMS after being seen by his PCP. Patient's blood pressure 78/54 at the doctor's office. Pt had a syncopal episode yesterday. PT arrives AxOx4. Pt did hit his head yesterday when he passed out in the bathroom. No reported blood thinners. Pt states he did lose consciousness yesterday. He arrives with a foley catheter in place that was inserted on Wednesday of last week.

## 2023-03-10 NOTE — Assessment & Plan Note (Signed)
No systolic pressures from 60s to 80s in the setting of baseline history of HFrEF with EF around 25-30% on 2D echo May 2024 as well as history of cardiogenic shock and cardiac arrest Clinically dry Continue IV fluid hydration Troponin pending with EKG showing paced rhythm Pending formal assessment by critical care as well as cardiology Hold offending agents including diuretic and antihypertensive regimen for now

## 2023-03-10 NOTE — Assessment & Plan Note (Signed)
Stable from a respiratory standpoint Continue home inhalers 

## 2023-03-10 NOTE — Assessment & Plan Note (Signed)
Baseline creatinine around 1.2-1.7 Creatinine 2.3 today with GFR in the 30s Clinically dry IV fluid hydration Hold offending agents Monitor Check [facility]  Formal nephrology consult if no improvement with appropriate hydration

## 2023-03-10 NOTE — Assessment & Plan Note (Signed)
SSI

## 2023-03-10 NOTE — Consult Note (Signed)
Cardiology Consultation   Patient ID: Edward Mullen MRN: [id number]; DOB: 1956/07/20  Admit date: 03/10/2023 Date of Consult: 03/10/2023  PCP:  Loura Back, NP    [facility]Care Providers Cardiologist:  None  Electrophysiologist:  Sherryl Manges, MD  Advanced Heart Failure:  Marca Ancona, MD       Patient Profile:   Edward Mullen is a 67 y.o. male with a hx of NICM, NSVT treated with amiodarone, hx of substance abuse, BIV ICD placed 07/2022, admit 11/2022 with PEA arrest,  cardiogenic shock,    who is being seen 03/10/2023 for the evaluation of hypotension and syncope at the request of Dr Rosalia Hammers.  History of Present Illness:   Edward Mullen with hx on NICM dating back to 2018 in setting of + cocaine use -see EF with dates below.  Cardiac cath was done and non obstructive and no ischemic cause of CM.  Followed by AHF, numerous admit complicated by non compliance.  Hx of syncope from VT but wore life vest and eventually in 07/2022 rec'd BiV ICD.  Pt with amiodarone and mexiletine for ATP therapy.   He did not have follow up with EP until recently.      Last cath 10/2021 showed 80% 1st RPL, 30% mLAD, felt stable, medically managed. Last echo 05/2022 EF 20-25%.   Echoes   DATE TEST EF    1/18 Echo   15-20 %    8/18 Echo  50 %    4/22 Echo       3/23 Echo  25-30%    8/23 Echo  25-30%    10/23 Echo  20-25% MR Mod     In April 2024 pt had PEA arrest and no arrhythmia detected on ICD.  It was out of hospital arrest 8 min time down.  He had cardiogenic shock.  His UDS + cocaine, + BENZ and + THC He did have distal sternal fx and multiple rib fractures.  Echo 11/18/22 with thrombus did not suspect endocarditis, was to have follow up echo  not done - no follow up   Discharged with amiodarone, ASA coreg 3.125 BID, mexiletine 150 BID, crestor 5 torsemide 20 no cardiac follow up   Last remote device check 02/10/23 with good battery, normal.   03/06/23 was seen in ER for urinary  retention, abd pain, testicular pain and flank pain.  Foley cath placed and nebulizer for SOB.  Treated for epididymoorchitis.  Treated with ABX   Today pt presents to ER for hypotension of 78/54 sent by PCP to ER by EMS.  He notes syncope yesterday.  Has rec'd 3.5 L LR  Na 132 K+ 3.8 Cr 2.30 albumin 3.0 glucose 110  Hs troponin 19 Lactic acid 1.4 to 1.5 WBC 8.7 Hgb 15.3 plts 211  Blood cultures pending CT head: no acute changes PCR  NAD   EKG:  The EKG was personally reviewed and demonstrates:  SR and V paced  Telemetry:  Telemetry was personally reviewed and demonstrates:    sR with V pacing  BP on arrival 86/64 p 63 R 22 afebrile  BP now 92/68   CCM has seen as well.     Past Medical History:  Diagnosis Date   Arthritis    "feels like it in my legs" (09/20/2014)   CAD (coronary artery disease)    Chronic kidney disease, stage 3a (HCC)    Chronic systolic CHF (congestive heart failure) (HCC)    Cocaine abuse (HCC)  Hypertension    NICM (nonischemic cardiomyopathy) (HCC)    NSVT (nonsustained ventricular tachycardia) (HCC) 09/06/2016   pre diabetes    patient denies    Past Surgical History:  Procedure Laterality Date   BIV ICD INSERTION CRT-D N/A 07/17/2022   Procedure: BIV ICD INSERTION CRT-D;  Surgeon: Duke Salvia, MD;  Location[facility] INVASIVE CV LAB;  Service: Cardiovascular;  Laterality: N/A;   CARDIAC CATHETERIZATION N/A 08/27/2016   Procedure: Right/Left Heart Cath and Coronary Angiography;  Surgeon: Laurey Morale, MD;  Location: [facility] INVASIVE CV LAB;  Service: Cardiovascular;  Laterality: N/A;   COLONOSCOPY     CYSTECTOMY Right    "back of my shoulder"   RIGHT/LEFT HEART CATH AND CORONARY ANGIOGRAPHY N/A 12/04/2020   Procedure: RIGHT/LEFT HEART CATH AND CORONARY ANGIOGRAPHY;  Surgeon: Laurey Morale, MD;  Location: [facility] INVASIVE CV LAB;  Service: Cardiovascular;  Laterality: N/A;   RIGHT/LEFT HEART CATH AND CORONARY ANGIOGRAPHY N/A 10/10/2021   Procedure:  RIGHT/LEFT HEART CATH AND CORONARY ANGIOGRAPHY;  Surgeon: Laurey Morale, MD;  Location: [facility] INVASIVE CV LAB;  Service: Cardiovascular;  Laterality: N/A;   TONSILLECTOMY       Home Medications:  Prior to Admission medications   Medication Sig Start Date End Date Taking? Authorizing Provider  acetaminophen (TYLENOL) 500 MG tablet Take 2 tablets (1,000 mg total) by mouth every 8 (eight) hours as needed. 12/16/22   Rocky Morel, DO  albuterol (PROVENTIL) (2.5 MG/3ML) 0.083% nebulizer solution USE ONE VIAL (2.5 MG TOTAL) BY NEBULIZATION EVERY 6 (SIX) HOURS AS NEEDED FOR SHORTNESS OF BREATH OR WHEEZING. 10/29/22   Morene Crocker, MD  albuterol (VENTOLIN HFA) 108 (90 Base) MCG/ACT inhaler INHALE 1 TO 2 PUFFS BY MOUTH EVERY 6 HOURS AS NEEDED FOR WHEEZING OR SHORTNESS OF BREATH 10/29/22   Morene Crocker, MD  amiodarone (PACERONE) 200 MG tablet Take 1 tablet (200 mg total) by mouth daily. 11/26/22   Zannie Cove, MD  ASPIRIN LOW DOSE 81 MG tablet TAKE 1 TABLET (81 MG TOTAL) BY MOUTH DAILY. SWALLOW WHOLE(AM) 01/27/23   Milford, Anderson Malta, FNP  bismuth subsalicylate (PEPTO BISMOL) 262 MG/15ML suspension Take 30 mLs by mouth every 6 (six) hours as needed for indigestion.    [provider]  BREO ELLIPTA 100-25 MCG/ACT AEPB Inhale 1 puff into the lungs daily. 08/02/22 08/02/23  Morene Crocker, MD  carvedilol (COREG) 3.125 MG tablet Take 1 tablet (3.125 mg total) by mouth 2 (two) times daily. 01/29/22 01/29/23  Jacklynn Ganong, FNP  Cholecalciferol (VITAMIN D3) 50 MCG (2000 UT) TABS Take 1 tablet by mouth daily.    [provider]  empagliflozin (JARDIANCE) 10 MG TABS tablet Take 1 tablet (10 mg total) by mouth daily. 11/26/22   Zannie Cove, MD  gabapentin (NEURONTIN) 100 MG capsule Take 2 capsules (200 mg total) by mouth 3 (three) times daily. 12/16/22 01/15/23  Rocky Morel, DO  HYDROcodone-acetaminophen (NORCO/VICODIN) 5-325 MG tablet Take 1 tablet by mouth every  6 (six) hours as needed. 03/06/23   Gwyneth Sprout, MD  levofloxacin (LEVAQUIN) 750 MG tablet Take 1 tablet (750 mg total) by mouth daily. 03/06/23   Gwyneth Sprout, MD  mexiletine (MEXITIL) 150 MG capsule Take 1 capsule (150 mg total) by mouth 2 (two) times daily. 07/31/22   Duke Salvia, MD  patiromer (VELTASSA) 8.4 g packet Take 1 packet (8.4 g total) by mouth daily. 12/03/22   Adron Bene, MD  rosuvastatin (CRESTOR) 5 MG tablet TAKE 1 TABLET (5 MG TOTAL)  BY MOUTH DAILY. 12/26/20   Laurey Morale, MD  tamsulosin (FLOMAX) 0.4 MG CAPS capsule TAKE ONE CAPSULE BY MOUTH ONCE DAILY AFTER SUPPER 02/20/21   Belva Agee, MD  torsemide (DEMADEX) 20 MG tablet Take 1 tablet (20 mg total) by mouth daily. 11/22/22   Zannie Cove, MD  umeclidinium bromide (INCRUSE ELLIPTA) 62.5 MCG/ACT AEPB Inhale 1 puff into the lungs daily. 06/17/22   Masters, Katie, DO  zolpidem (AMBIEN) 10 MG tablet Take 1 tablet (10 mg total) by mouth at bedtime as needed for sleep. 09/16/22 01/03/23      Inpatient Medications: Scheduled Meds:  Continuous Infusions:  PRN Meds:   Allergies:   No Known Allergies  Social History:   Social History   Socioeconomic History   Marital status: Married    Spouse name: Not on file   Number of children: Not on file   Years of education: Not on file   Highest education level: Not on file  Occupational History   Occupation: Special educational needs teacher    Comment: has not been able to work steadily for the last year or two  Tobacco Use   Smoking status: Some Days    Current packs/day: 0.10    Average packs/day: 0.1 packs/day for 48.0 years (4.8 ttl pk-yrs)    Types: Cigarettes   Smokeless tobacco: Never   Tobacco comments:    cutting back 2  per day.  No cigs  Vaping Use   Vaping status: Never Used  Substance and Sexual Activity   Alcohol use: Yes    Alcohol/week: 1.0 standard drink of alcohol    Types: 1 Cans of beer per week    Comment: occasionally   Drug use: Yes     Types: Marijuana    Comment: daily 4 times last 2 weeks ago as of 05/31/2019   Sexual activity: Yes    Birth control/protection: None  Other Topics Concern   Not on file  Social History Narrative   Not on file   Social Determinants of Health   Financial Resource Strain: Low Risk  (06/17/2022)   Overall Financial Resource Strain (CARDIA)    Difficulty of Paying Living Expenses: Not hard at all  Food Insecurity: No Food Insecurity (06/17/2022)   Hunger Vital Sign    Worried About Running Out of Food in the Last Year: Never true    Ran Out of Food in the Last Year: Never true  Recent Concern: Food Insecurity - Food Insecurity Present (06/08/2022)   Hunger Vital Sign    Worried About Running Out of Food in the Last Year: Sometimes true    Ran Out of Food in the Last Year: Sometimes true  Transportation Needs: No Transportation Needs (06/17/2022)   PRAPARE - Administrator, Civil Service (Medical): No    Lack of Transportation (Non-Medical): No  Recent Concern: Transportation Needs - Unmet Transportation Needs (06/08/2022)   PRAPARE - Administrator, Civil Service (Medical): Yes    Lack of Transportation (Non-Medical): No  Physical Activity: Inactive (06/17/2022)   Exercise Vital Sign    Days of Exercise per Week: 0 days    Minutes of Exercise per Session: 0 min  Stress: No Stress Concern Present (06/17/2022)   [employer] of Occupational Health - Occupational Stress Questionnaire    Feeling of Stress : Not at all  Social Connections: Unknown (03/04/2023)   Received from [facility]   Social Network    Social Network: Not on file  Intimate Partner Violence: Unknown (03/04/2023)   Received from [facility]   HITS    Physically Hurt: Not on file    Insult or Talk Down To: Not on file    Threaten Physical Harm: Not on file    Scream or Curse: Not on file    Family History:    Family History  Problem Relation Age of Onset   Stroke Mother 33    Heart disease Father 79   Colon cancer Neg Hx    Colon polyps Neg Hx    Esophageal cancer Neg Hx    Stomach cancer Neg Hx    Rectal cancer Neg Hx      ROS:  Please see the history of present illness.  General:no colds or fevers, no weight changes Skin:no rashes or ulcers HEENT:no blurred vision, no congestion CV:see HPI PUL:see HPI GI:no diarrhea constipation or melena, no indigestion GU:no hematuria, no dysuria MS:no joint pain, no claudication, hx arthritis  Neuro:+ syncope, no lightheadedness Endo:pre-diabetes, no thyroid disease  All other ROS reviewed and negative.     Physical Exam/Data:   Vitals:   03/10/23 1500 03/10/23 1530 03/10/23 1630 03/10/23 1640  BP: 91/65 (!) 84/63 90/65   Pulse: 67 66 66   Resp: 17 19 (!) 23   Temp:    98 F (36.7 C)  SpO2: 98% 97% 96%   Weight:      Height:        Intake/Output Summary (Last 24 hours) at 03/10/2023 1642 Last data filed at 03/10/2023 1347 Gross per 24 hour  Intake --  Output 1000 ml  Net -1000 ml      03/10/2023   11:06 AM 03/06/2023    4:34 PM 12/16/2022   10:46 AM  Last 3 Weights  Weight (lbs) 180 lb 183 lb 190 lb 1.6 oz  Weight (kg) 81.647 kg 83.008 kg 86.229 kg     Body mass index is 24.41 kg/m.  General:  thin male in no acute distress  HEENT: normal Neck: no JVD Vascular: No carotid bruits; Distal pulses 2+ bilaterally Cardiac:  normal S1, S2; RRR; no murmur gallup rub or click Lungs:  rales in bases to auscultation bilaterally, no wheezing, rhonchi   Abd: soft, nontender, no hepatomegaly  Ext: no edema legs cool Musculoskeletal:  No deformities, BUE and BLE strength normal and equal Skin: warm and dry  Neuro:  alert and oreineted X 3 MAE follows commands, no focal abnormalities noted Psych:  Normal affect    Relevant CV Studies: Echo 11/18/22 IMPRESSIONS     1. Left ventricular ejection fraction, by estimation, is 25 to 30%. The  left ventricle has severely decreased function. The left  ventricle  demonstrates global hypokinesis. The left ventricular internal cavity size  was severely dilated. There is mild  concentric left ventricular hypertrophy. Left ventricular diastolic  parameters are consistent with Grade I diastolic dysfunction (impaired  relaxation).   2. Right ventricular systolic function is normal. The right ventricular  size is normal.   3. The mitral valve is normal in structure. Trivial mitral valve  regurgitation. No evidence of mitral stenosis.   4. There is a hazy mobile density on the aortic side of the aorti valve  cusp seen in the PSAX view at the base of the cusp. Cannot rule out  vegetation. The aortic valve is tricuspid in structure. Aortic valve  regurgitation is mild to moderate. Aortic  valve sclerosis/calcification is present, without any evidence of aortic  stenosis.  Aortic regurgitation PHT measures 672 msec. Aortic valve area,  by VTI measures 1.76 cm. Aortic valve mean gradient measures 12.0 mmHg.  Aortic valve Vmax measures 2.04  m/s.   5. Aortic dilatation noted. There is moderate dilatation of the ascending  aorta, measuring 46 mm.   6. The inferior vena cava is normal in size with greater than 50%  respiratory variability, suggesting right atrial pressure of 3 mmHg.   Conclusion(s)/Recommendation(s): Findings concerning for Aortic valve  vegetation, would recommend Transesophageal Echocardiogram for  clarification.   FINDINGS   Left Ventricle: Left ventricular ejection fraction, by estimation, is 25  to 30%. The left ventricle has severely decreased function. The left  ventricle demonstrates global hypokinesis. Definity contrast agent was  given IV to delineate the left  ventricular endocardial borders. The left ventricular internal cavity size  was severely dilated. There is mild concentric left ventricular  hypertrophy. Abnormal (paradoxical) septal motion, consistent with RV  pacemaker. Left ventricular diastolic   parameters are consistent with Grade I diastolic dysfunction (impaired  relaxation). Normal left ventricular filling pressure.   Right Ventricle: The right ventricular size is normal. No increase in  right ventricular wall thickness. Right ventricular systolic function is  normal.   Left Atrium: Left atrial size was normal in size.   Right Atrium: Right atrial size was normal in size.   Pericardium: There is no evidence of pericardial effusion.   Mitral Valve: The mitral valve is normal in structure. Trivial mitral  valve regurgitation. No evidence of mitral valve stenosis. MV peak  gradient, 3.4 mmHg. The mean mitral valve gradient is 1.0 mmHg.   Tricuspid Valve: The tricuspid valve is normal in structure. Tricuspid  valve regurgitation is not demonstrated. No evidence of tricuspid  stenosis.   Aortic Valve: There is a hazy mobile density on the aortic side of the  aorti valve cusp seen in the PSAX view at the base of the cusp. Cannot  rule out vegetation. The aortic valve is tricuspid. Aortic valve  regurgitation is mild to moderate. Aortic  regurgitation PHT measures 672 msec. Aortic valve sclerosis/calcification  is present, without any evidence of aortic stenosis. Aortic valve mean  gradient measures 12.0 mmHg. Aortic valve peak gradient measures 16.6  mmHg. Aortic valve area, by VTI  measures 1.76 cm.   Pulmonic Valve: The pulmonic valve was normal in structure. Pulmonic valve  regurgitation is not visualized. No evidence of pulmonic stenosis.   Aorta: Aortic dilatation noted. There is moderate dilatation of the  ascending aorta, measuring 46 mm.   Venous: The inferior vena cava is normal in size with greater than 50%  respiratory variability, suggesting right atrial pressure of 3 mmHg.   IAS/Shunts: No atrial level shunt detected by color flow Doppler.   Additional Comments: A device lead is visualized.       Laboratory Data:  High Sensitivity Troponin:    Recent Labs  Lab 03/10/23 1444  TROPONINIHS 19*     Chemistry Recent Labs  Lab 03/06/23 1633 03/10/23 1129  NA 132* 132*  K 4.1 3.8  CL 95* 96*  CO2 20* 25  GLUCOSE 133* 110*  BUN 19 35*  CREATININE 1.34* 2.30*  CALCIUM 9.0 8.8*  GFRNONAA 58* 30*  ANIONGAP 17* 11    Recent Labs  Lab 03/06/23 1633 03/10/23 1129  PROT 7.7 7.9  ALBUMIN 3.0* 3.0*  AST 23 20  ALT 15 14  ALKPHOS 92 121  BILITOT 1.5* 0.6   Lipids No results for  input(s): "CHOL", "TRIG", "HDL", "LABVLDL", "LDLCALC", "CHOLHDL" in the last 168 hours.  Hematology Recent Labs  Lab 03/06/23 1633 03/10/23 1129  WBC 9.7 8.7  RBC 5.24 5.99*  HGB 13.9 15.3  HCT 43.0 48.8  MCV 82.1 81.5  MCH 26.5 25.5*  MCHC 32.3 31.4  RDW 15.9* 15.9*  PLT 176 211   Thyroid No results for input(s): "TSH", "FREET4" in the last 168 hours.  BNP Recent Labs  Lab 03/06/23 1633  BNP 2,094.2*    DDimer No results for input(s): "DDIMER" in the last 168 hours.   Radiology/Studies:  CT Head Wo Contrast  Result Date: 03/10/2023 CLINICAL DATA:  Head trauma. EXAM: CT HEAD WITHOUT CONTRAST TECHNIQUE: Contiguous axial images were obtained from the base of the skull through the vertex without intravenous contrast. RADIATION DOSE REDUCTION: This exam was performed according to the departmental dose-optimization program which includes automated exposure control, adjustment of the mA and/or kV according to patient size and/or use of iterative reconstruction technique. COMPARISON:  CT head dated November 17, 2022. FINDINGS: Brain: No evidence of acute infarction, hemorrhage, hydrocephalus, extra-axial collection or mass lesion/mass effect. Stable chronic microvascular ischemic changes. Vascular: Calcified atherosclerosis at the skull base. No hyperdense vessel. Skull: Normal. Negative for fracture or focal lesion. Sinuses/Orbits: No acute finding. Other: None. IMPRESSION: 1. No acute intracranial abnormality. 2. Stable chronic microvascular  ischemic changes. Electronically Signed   By: Obie Dredge M.D.   On: 03/10/2023 13:04   DG Chest Port 1 View  Result Date: 03/10/2023 CLINICAL DATA:  Possible sepsis. EXAM: PORTABLE CHEST 1 VIEW COMPARISON:  Chest x-ray dated March 06, 2023. FINDINGS: Unchanged left chest wall pacemaker and cardiomegaly. No focal consolidation, pleural effusion, or pneumothorax. No acute osseous abnormality. IMPRESSION: No active disease. Electronically Signed   By: Obie Dredge M.D.   On: 03/10/2023 12:29   US SCROTUM W/DOPPLER  Result Date: 03/06/2023 CLINICAL DATA:  Right testicular pain and swelling EXAM: SCROTAL ULTRASOUND DOPPLER ULTRASOUND OF THE TESTICLES TECHNIQUE: Complete ultrasound examination of the testicles, epididymis, and other scrotal structures was performed. Color and spectral Doppler ultrasound were also utilized to evaluate blood flow to the testicles. COMPARISON:  None Available. FINDINGS: Right testicle Measurements: 4.1 x 2.6 x 3.3 cm. Diffusely heterogeneous and hypervascular. No mass or microlithiasis visualized. Left testicle Measurements: 4.1 x 1.8 x 2.8 cm. No mass or microlithiasis visualized. Right epididymis:  Enlarged and hyperemic. Left epididymis:  Normal in size and appearance. Hydrocele: Moderate right hydrocele and small left hydrocele present. Within within the midline scrotal soft tissues there is a simple cystic area measuring 2.4 x 2.3 x 3.5 cm. Varicocele:  None visualized. Pulsed Doppler interrogation of both testes demonstrates normal low resistance arterial and venous waveforms bilaterally. IMPRESSION: 1. Heterogeneous and hypervascular right testicle and epididymis compatible with epididymo-orchitis. 2. Moderate right and small left hydroceles. 3. Simple cystic area within the midline scrotal soft tissues measuring 2.4 x 2.3 x 3.5 cm. Electronically Signed   By: Darliss Cheney M.D.   On: 03/06/2023 18:20   DG Chest Port 1 View  Result Date: 03/06/2023 CLINICAL DATA:   Shortness of breath. EXAM: PORTABLE CHEST 1 VIEW COMPARISON:  November 24, 2022. FINDINGS: Stable cardiomediastinal silhouette. Left-sided defibrillator is unchanged. No acute pulmonary disease is noted. Bony thorax is unremarkable. IMPRESSION: No active disease. Electronically Signed   By: Lupita Raider M.D.   On: 03/06/2023 17:22     Assessment and Plan:   Hypotension/syncope  most likely due to hypotension. -  will interrogate his device to rule out arrhythmia.  K+ 3.8 will check Mg+  CT head stable - p thas been given 3.5 L LR. Monitor breath sounds with NICM  had been on coreg - hold for now Hypotension per IM AKI with baseline 1.2 to 1.7 - should improve with hydration  per IM  BiV ICD pacing now in SR -will interrogate  NSVT continue amiodarone and mexilitine check TSH  keep K+ 4.0 and Mg+ 2.0  Polysubstance about reports only TSH UDS pending  DM2 per IM    Risk Assessment/Risk Scores:                For questions or updates, please contact [facility] Please consult [email or web address] for contact info under    Signed, Nada Boozer, NP  03/10/2023 4:42 PM

## 2023-03-11 ENCOUNTER — Inpatient Hospital Stay (HOSPITAL_COMMUNITY): Payer: Medicare HMO

## 2023-03-11 DIAGNOSIS — E869 Volume depletion, unspecified: Secondary | ICD-10-CM | POA: Diagnosis not present

## 2023-03-11 DIAGNOSIS — N1831 Chronic kidney disease, stage 3a: Secondary | ICD-10-CM | POA: Diagnosis not present

## 2023-03-11 DIAGNOSIS — J42 Unspecified chronic bronchitis: Secondary | ICD-10-CM

## 2023-03-11 DIAGNOSIS — I5020 Unspecified systolic (congestive) heart failure: Secondary | ICD-10-CM | POA: Diagnosis not present

## 2023-03-11 DIAGNOSIS — R55 Syncope and collapse: Secondary | ICD-10-CM

## 2023-03-11 DIAGNOSIS — I4729 Other ventricular tachycardia: Secondary | ICD-10-CM | POA: Diagnosis not present

## 2023-03-11 DIAGNOSIS — E1122 Type 2 diabetes mellitus with diabetic chronic kidney disease: Secondary | ICD-10-CM

## 2023-03-11 DIAGNOSIS — N1832 Chronic kidney disease, stage 3b: Secondary | ICD-10-CM

## 2023-03-11 DIAGNOSIS — I10 Essential (primary) hypertension: Secondary | ICD-10-CM

## 2023-03-11 DIAGNOSIS — F1910 Other psychoactive substance abuse, uncomplicated: Secondary | ICD-10-CM | POA: Diagnosis not present

## 2023-03-11 LAB — GLUCOSE, CAPILLARY
Glucose-Capillary: 78 mg/dL (ref 70–99)
Glucose-Capillary: 83 mg/dL (ref 70–99)
Glucose-Capillary: 115 mg/dL — ABNORMAL HIGH (ref 70–99)

## 2023-03-11 LAB — CBG MONITORING, ED
Glucose-Capillary: 119 mg/dL — ABNORMAL HIGH (ref 70–99)
Glucose-Capillary: 98 mg/dL (ref 70–99)

## 2023-03-11 MED ORDER — ENSURE ENLIVE PO LIQD
237.0000 mL | Freq: Two times a day (BID) | ORAL | Status: DC
  Administered 2023-03-11 – 2023-03-12 (×2): 237 mL via ORAL

## 2023-03-11 MED ORDER — CALAMINE EX LOTN
1.0000 | TOPICAL_LOTION | CUTANEOUS | Status: DC | PRN
  Administered 2023-03-11: 1 via TOPICAL
  Filled 2023-03-11: qty 177

## 2023-03-11 MED ORDER — PERFLUTREN LIPID MICROSPHERE
1.0000 mL | INTRAVENOUS | Status: AC | PRN
  Administered 2023-03-11: 3.5 mL via INTRAVENOUS
  Filled 2023-03-11: qty 10

## 2023-03-11 MED ORDER — CHLORHEXIDINE GLUCONATE CLOTH 2 % EX PADS
6.0000 | MEDICATED_PAD | Freq: Every day | CUTANEOUS | Status: DC
  Administered 2023-03-11 – 2023-03-12 (×2): 6 via TOPICAL

## 2023-03-11 MED ORDER — CARVEDILOL 3.125 MG PO TABS
3.1250 mg | ORAL_TABLET | Freq: Two times a day (BID) | ORAL | Status: DC
  Administered 2023-03-12: 3.125 mg via ORAL
  Filled 2023-03-11: qty 1

## 2023-03-11 NOTE — Progress Notes (Signed)
[facility] [address] [facility] Liaison note: This patient is currently enrolled in [facility] outpatient-based palliative care. Hospital Liaison will continue to follow for discharge disposition. Please call for any outpatient based palliative care related questions or concerns. Thank you, Thea Gist BSN, RN Hospice hospital liaison [phone]

## 2023-03-11 NOTE — Progress Notes (Signed)
Patient Name: ADVAITH VAYNSHTEYN Date of Encounter: 03/11/2023 [facility] Cardiologist: None   Interval Summary  .    Feeling better.  Breathing improved.  No lightheadedness or dizziness.  Continues to have pain from shingles and testicular pain.  Vital Signs .    Vitals:   03/11/23 0621 03/11/23 0750 03/11/23 0830 03/11/23 0945  BP:  95/74 105/78 101/68  Pulse:  64 70 68  Resp:  (!) 26 20 (!) 35  Temp: (!) 97.5 F (36.4 C)     TempSrc: Oral     SpO2:  98% 96% 96%  Weight:      Height:        Intake/Output Summary (Last 24 hours) at 03/11/2023 1002 Last data filed at 03/11/2023 0656 Gross per 24 hour  Intake 1312.96 ml  Output 4150 ml  Net -2837.04 ml      03/10/2023   11:06 AM 03/06/2023    4:34 PM 12/16/2022   10:46 AM  Last 3 Weights  Weight (lbs) 180 lb 183 lb 190 lb 1.6 oz  Weight (kg) 81.647 kg 83.008 kg 86.229 kg      Telemetry/ECG    03/10/23: ASVP.  Rate 67 bpm - Personally Reviewed  Physical Exam .    VS:  BP 100/67 (BP Location: Left Arm)   Pulse 67   Temp 97.8 F (36.6 C) (Oral)   Resp 20   Ht 6' (1.829 m)   Wt 81.6 kg   SpO2 99%   BMI 24.41 kg/m  , BMI Body mass index is 24.41 kg/m. GENERAL:  Well appearing.  Frail HEENT: Pupils equal round and reactive, fundi not visualized, oral mucosa unremarkable NECK:  No jugular venous distention, waveform within normal limits, carotid upstroke brisk and symmetric, no bruits, no thyromegaly LUNGS:  Clear to auscultation bilaterally HEART:  RRR.  PMI not displaced or sustained,S1 and S2 within normal limits, no S3, no S4, no clicks, no rubs, no murmurs ABD:  Flat, positive bowel sounds normal in frequency in pitch, no bruits, no rebound, no guarding, no midline pulsatile mass, no hepatomegaly, no splenomegaly EXT:  2 plus pulses throughout, no edema, no cyanosis no clubbing SKIN:  No rashes no nodules NEURO:  Cranial nerves II through XII grossly intact, motor grossly intact throughout PSYCH:   Cognitively intact, oriented to person place and time   Assessment & Plan .     Mr. Schirtzinger is a 71M with NICM, s/p BiV ICD, cocaine abuse, NSVT, PEA arrest admitted with syncope and hypotension.   # Syncope: # Hypotension:  SBP upper 80s to l00s. He is feeling much better after IV fluids.  Not orthostatic when vitals were just checked.  We will resume carvedilol 3.125 mg twice daily.  He was taking torsemide every day at home.  I suspect he only needs this as needed.  He has not been on Entresto in years.  Given his hypotension on admission, would not add this back at this time.  His device was reportedly interrogated in the ED but I do not see the report in his chart, either paper or electronic.  Will have it interrogated tomorrow.  He denies palpitations or chest pain.  Cath 10/2021 revealed 80% RPL and otherwise non-obstructive disease.  Urine tox was positive for THC and opiates but none for cocaine.  # NICM: Euvolemic on exam.  Echo this admission revealed LVEF less than 20%.  Resuming carvedilol as above.  BP otherwise too low for GDMT.   #  VT:  Continue amiodarone and mexiletine.  No significant arrhythmia thus far on telemetry or EKG.  Electrolytes unremarkable.  Resuming carvedilol as above.   # AKI: Renal function improving with hydration.  For questions or updates, please contact [facility] Please consult [email or web address] for contact info under        Signed, Chilton Si, MD

## 2023-03-11 NOTE — Plan of Care (Signed)

## 2023-03-11 NOTE — Progress Notes (Signed)
PROGRESS NOTE    Edward Mullen  KZS:[id number] DOB: 11-08-1955 DOA: 03/10/2023 PCP: Loura Back, NP   Brief Narrative:  Edward Mullen is a 67 y.o. male with medical history significant of coronary artery disease, CKD 3A HFrEF, polysubstance abuse, hypertension, non-ischemic cardiomyopathy, history of cardiogenic shock and PEA arrest April 2024 presenting with hypotension, syncope/presyncope.  PCCM initially consulted due to hypotension but given improvement with fluids hospitalist was called for admission, cardiology called for consult.  Assessment & Plan:   Principal Problem:   Hypotension Active Problems:   Syncope   HTN (hypertension)   Polysubstance abuse (HCC)   NSVT (nonsustained ventricular tachycardia) (HCC)   CKD (chronic kidney disease) stage 3, GFR 30-59 ml/min (HCC)   Type 2 diabetes mellitus with stage 3b chronic kidney disease, without long-term current use of insulin (HCC)   COPD (chronic obstructive pulmonary disease) (HCC)   Cardiogenic shock (HCC)   HFrEF (heart failure with reduced ejection fraction) (HCC)   Volume depletion  Syncope versus presyncope Hypotension, orthostatic versus vasovagal -Reported truly syncopal event, transient, with systolic blood pressure noted to be in the 60s -Questionable orthostatics versus vasodilation/vasovagal in the setting of dehydration and hypovolemia -CT head unremarkable -no indication for MRI at this time given nonfocal neuroexam -Continue IV fluids, follow clinically -Hold any antihypertensive medications/diuretics -Home meds listed include hydrocodone, amiodarone mexiletine, carvedilol, Entresto, torsemide.  Patient indicates he has not started [facility] which would be the most likely medication given his med rec -If patient improves off medications we will slowly titrate back on board, defer to cardiology for reintroduction order, likely Entresto and diuretics followed by beta-blockers and antiarrhythmics    Systolic heart failure with reduced ejection fraction, EF 25 to 30%  Cardiogenic shock (HCC), history of -History of cardiogenic shock and PEA arrest during April 2024 in the setting of illicit substance abuse (cocaine/benzos/THC) -Last known echo May 2024 -Not in acute exacerbation -Recent history of cardiogenic shock and cardiac arrest per documentation after illicit substance abuse -Continue IVF -Troponin 19, range 37-66 over the past year. -EKG with paced rhythm -Cardiology following along   COPD (chronic obstructive pulmonary disease) (HCC) Stable from a respiratory standpoint, no acute exacerbation Continue home inhalers   Type 2 diabetes mellitus well controlled - with stage 3A chronic kidney disease, without long-term current use of insulin (HCC) Continue sliding scale insulin, hypoglycemic protocol  A1C 5.5   AKI on Chronic kidney disease stage 3A Hypokalemia mild Baseline creatinine around 1.2-1.7 Creatinine 2.3 at intake - downtrending appropriately IV fluid hydration ongoing Hold offending agents Follow morning labs, replete potassium if continues to downtrend  Polysubstance abuse (HCC) Patient self reports marijuana use only However, UDS positive for opiates and THC (previously also positive for cocaine in April)     DVT prophylaxis: enoxaparin (LOVENOX) injection 40 mg Start: 03/10/23 2200 SCDs Start: 03/10/23 1657 Code Status:   Code Status: Full Code Family Communication: None present  Status is: Inpatient   Dispo: The patient is from: Home              Anticipated d/c is to: To be determined              Anticipated d/c date is: 72+ hours              Patient currently not medically stable for discharge  Consultants:  PCCM, cardiology   Procedures:  None  Antimicrobials:  None  Subjective: No acute issues or events overnight, blood pressure minimally improving  Objective: Vitals:   03/11/23 1130 03/11/23 1145 03/11/23 1215 03/11/23 1217   BP: 113/78 94/64 105/77   Pulse: 62 65    Resp: 16 19    Temp:    98 F (36.7 C)  TempSrc:    Oral  SpO2: 95% 96%    Weight:      Height:        Intake/Output Summary (Last 24 hours) at 03/11/2023 1328 Last data filed at 03/11/2023 0656 Gross per 24 hour  Intake 1312.96 ml  Output 4150 ml  Net -2837.04 ml   Filed Weights   03/10/23 1106  Weight: 81.6 kg    Examination:  General:  Pleasantly resting in bed, No acute distress. HEENT:  Normocephalic atraumatic.  Sclerae nonicteric, noninjected.  Extraocular movements intact bilaterally.  Mucous membranes dry Neck:  Without mass or deformity.  Trachea is midline. Lungs: Without overt rhonchi, wheeze, or rales. Heart:  Regular rate and rhythm.  Without murmurs, rubs, or gallops. Abdomen:  Soft, nontender, nondistended.  Foley catheter intact. Extremities: Without cyanosis, clubbing, edema, or obvious deformity. Skin:  Warm and dry, no erythema.  Data Reviewed: I have personally reviewed following labs and imaging studies  CBC: Recent Labs  Lab 03/06/23 1633 03/10/23 1129 03/11/23 0131  WBC 9.7 8.7 6.0  NEUTROABS 8.1* 6.8  --   HGB 13.9 15.3 13.2  HCT 43.0 48.8 40.9  MCV 82.1 81.5 80.8  PLT 176 211 162   Basic Metabolic Panel: Recent Labs  Lab 03/06/23 1633 03/10/23 1129 03/10/23 1825 03/11/23 0131  NA 132* 132*  --  133*  K 4.1 3.8  --  3.4*  CL 95* 96*  --  98  CO2 20* 25  --  23  GLUCOSE 133* 110*  --  104*  BUN 19 35*  --  32*  CREATININE 1.34* 2.30*  --  1.71*  CALCIUM 9.0 8.8*  --  8.3*  MG  --   --  1.8  --    GFR: Estimated Creatinine Clearance: 45.4 mL/min (A) (by C-G formula based on SCr of 1.71 mg/dL (H)).  Scheduled Meds:  Chlorhexidine Gluconate Cloth  6 each Topical Daily   enoxaparin (LOVENOX) injection  40 mg Subcutaneous Q24H   feeding supplement  237 mL Oral BID BM   insulin aspart  0-9 Units Subcutaneous TID WC   Continuous Infusions:   LOS: 1 day   Time spent:   Azucena Fallen, DO [facility]  If 7PM-7AM, please contact night-coverage [email or web address]  03/11/2023, 1:28 PM

## 2023-03-11 NOTE — Progress Notes (Signed)
ST Jude ICD to be interrogated tomorrow by rep, called, will put in report in chart.

## 2023-03-11 NOTE — ED Notes (Signed)
ED TO INPATIENT HANDOFF REPORT  ED Nurse Name and Phone #: [phone]   S Name/Age/Gender Edward Mullen 67 y.o. male Room/Bed: 009C/009C  Code Status   Code Status: Full Code  Home/SNF/Other Home Patient oriented to: self, place, time, and situation Is this baseline? Yes   Triage Complete: Triage complete  Chief Complaint Hypotension [I95.9]  Triage Note Pt arrives via EMS after being seen by his PCP. Patient's blood pressure 78/54 at the doctor's office. Pt had a syncopal episode yesterday. PT arrives AxOx4. Pt did hit his head yesterday when he passed out in the bathroom. No reported blood thinners. Pt states he did lose consciousness yesterday. He arrives with a foley catheter in place that was inserted on Wednesday of last week.    Allergies No Known Allergies  Level of Care/Admitting Diagnosis ED Disposition     ED Disposition  Admit   Condition  --   Comment  Hospital Area: [facility] [[id number]]  Level of Care: Progressive [102]  Admit to Progressive based on following criteria: CARDIOVASCULAR & THORACIC of moderate stability with acute coronary syndrome symptoms/low risk myocardial infarction/hypertensive urgency/arrhythmias/heart failure potentially compromising stability and stable post cardiovascular intervention patients.  May admit patient to [facility] or [facility] if equivalent level of care is available:: No  Covid Evaluation: Confirmed COVID Negative  Diagnosis: Hypotension [454098]  Admitting Physician: Floydene Flock [[id number]]  Attending Physician: Floydene Flock [phone]  Certification:: I certify this patient will need inpatient services for at least 2 midnights  Estimated Length of Stay: 2          B Medical/Surgery History Past Medical History:  Diagnosis Date   Arthritis    "feels like it in my legs" (09/20/2014)   CAD (coronary artery disease)    Chronic kidney disease, stage 3a (HCC)    Chronic systolic CHF  (congestive heart failure) (HCC)    Cocaine abuse (HCC)    Hypertension    NICM (nonischemic cardiomyopathy) (HCC)    NSVT (nonsustained ventricular tachycardia) (HCC) 09/06/2016   pre diabetes    patient denies   Past Surgical History:  Procedure Laterality Date   BIV ICD INSERTION CRT-D N/A 07/17/2022   Procedure: BIV ICD INSERTION CRT-D;  Surgeon: Duke Salvia, MD;  Location: [facility] INVASIVE CV LAB;  Service: Cardiovascular;  Laterality: N/A;   CARDIAC CATHETERIZATION N/A 08/27/2016   Procedure: Right/Left Heart Cath and Coronary Angiography;  Surgeon: Laurey Morale, MD;  Location: [facility] INVASIVE CV LAB;  Service: Cardiovascular;  Laterality: N/A;   COLONOSCOPY     CYSTECTOMY Right    "back of my shoulder"   RIGHT/LEFT HEART CATH AND CORONARY ANGIOGRAPHY N/A 12/04/2020   Procedure: RIGHT/LEFT HEART CATH AND CORONARY ANGIOGRAPHY;  Surgeon: Laurey Morale, MD;  Location: [facility] INVASIVE CV LAB;  Service: Cardiovascular;  Laterality: N/A;   RIGHT/LEFT HEART CATH AND CORONARY ANGIOGRAPHY N/A 10/10/2021   Procedure: RIGHT/LEFT HEART CATH AND CORONARY ANGIOGRAPHY;  Surgeon: Laurey Morale, MD;  Location: [facility] INVASIVE CV LAB;  Service: Cardiovascular;  Laterality: N/A;   TONSILLECTOMY       A IV Location/Drains/Wounds Patient Lines/Drains/Airways Status     Active Line/Drains/Airways     Name Placement date Placement time Site Days   Peripheral IV 03/10/23 20 G Anterior;Left Forearm 03/10/23  1053  Forearm  1   Peripheral IV 03/10/23 20 G Right Antecubital 03/10/23  1125  Antecubital  1   Urethral Catheter 16 Fr. 03/06/23  1737  --  5            Intake/Output Last 24 hours  Intake/Output Summary (Last 24 hours) at 03/11/2023 1052 Last data filed at 03/11/2023 4782 Gross per 24 hour  Intake 1312.96 ml  Output 4150 ml  Net -2837.04 ml    Labs/Imaging Results for orders placed or performed during the hospital encounter of 03/10/23 (from the past 48 hour(s))  Blood Culture (routine  x 2)     Status: None (Preliminary result)   Collection Time: 03/10/23 11:25 AM   Specimen: BLOOD  Result Value Ref Range   Specimen Description BLOOD RIGHT ANTECUBITAL    Special Requests      BOTTLES DRAWN AEROBIC AND ANAEROBIC Blood Culture adequate volume   Culture      NO GROWTH < 24 HOURS Performed at [facility], [address] [place], [place] [address]    Report Status PENDING   Urinalysis, w/ Reflex to Culture (Infection Suspected) -Urine, Catheterized; Indwelling urinary catheter     Status: Abnormal   Collection Time: 03/10/23 11:27 AM  Result Value Ref Range   Specimen Source URINE, CATHETERIZED    Color, Urine COLORLESS (A) YELLOW   APPearance CLEAR CLEAR   Specific Gravity, Urine 1.004 (L) 1.005 - 1.030   pH 6.0 5.0 - 8.0   Glucose, UA 150 (A) NEGATIVE mg/dL   Hgb urine dipstick NEGATIVE NEGATIVE   Bilirubin Urine NEGATIVE NEGATIVE   Ketones, ur NEGATIVE NEGATIVE mg/dL   Protein, ur NEGATIVE NEGATIVE mg/dL   Nitrite NEGATIVE NEGATIVE   Leukocytes,Ua NEGATIVE NEGATIVE   RBC / HPF 0-5 0 - 5 RBC/hpf   WBC, UA 0-5 0 - 5 WBC/hpf    Comment:        Reflex urine culture not performed if WBC <=10, OR if Squamous epithelial cells >5. If Squamous epithelial cells >5 suggest recollection.    Bacteria, UA NONE SEEN NONE SEEN   Squamous Epithelial / HPF 0-5 0 - 5 /HPF    Comment: Performed at [facility], [address] [place], [place] [address]  Comprehensive metabolic panel     Status: Abnormal   Collection Time: 03/10/23 11:29 AM  Result Value Ref Range   Sodium 132 (L) 135 - 145 mmol/L   Potassium 3.8 3.5 - 5.1 mmol/L   Chloride 96 (L) 98 - 111 mmol/L   CO2 25 22 - 32 mmol/L   Glucose, Bld 110 (H) 70 - 99 mg/dL    Comment: Glucose reference range applies only to samples taken after fasting for at least 8 hours.   BUN 35 (H) 8 - 23 mg/dL   Creatinine, Ser 7.84 (H) 0.61 - 1.24 mg/dL   Calcium 8.8 (L) 8.9 - 10.3 mg/dL   Total Protein 7.9 6.5 - 8.1  g/dL   Albumin 3.0 (L) 3.5 - 5.0 g/dL   AST 20 15 - 41 U/L   ALT 14 0 - 44 U/L   Alkaline Phosphatase 121 38 - 126 U/L   Total Bilirubin 0.6 0.3 - 1.2 mg/dL   GFR, Estimated 30 (L) >60 mL/min    Comment: (NOTE) Calculated using the CKD-EPI Creatinine Equation (2021)    Anion gap 11 5 - 15    Comment: Performed at [facility], [address] [place], [place] [address]  CBC with Differential     Status: Abnormal   Collection Time: 03/10/23 11:29 AM  Result Value Ref Range   WBC 8.7 4.0 - 10.5 K/uL  RBC 5.99 (H) 4.22 - 5.81 MIL/uL   Hemoglobin 15.3 13.0 - 17.0 g/dL   HCT 16.1 09.6 - 04.5 %   MCV 81.5 80.0 - 100.0 fL   MCH 25.5 (L) 26.0 - 34.0 pg   MCHC 31.4 30.0 - 36.0 g/dL   RDW 40.9 (H) 81.1 - 91.4 %   Platelets 211 150 - 400 K/uL   nRBC 0.0 0.0 - 0.2 %   Neutrophils Relative % 78 %   Neutro Abs 6.8 1.7 - 7.7 K/uL   Lymphocytes Relative 8 %   Lymphs Abs 0.7 0.7 - 4.0 K/uL   Monocytes Relative 13 %   Monocytes Absolute 1.2 (H) 0.1 - 1.0 K/uL   Eosinophils Relative 0 %   Eosinophils Absolute 0.0 0.0 - 0.5 K/uL   Basophils Relative 0 %   Basophils Absolute 0.0 0.0 - 0.1 K/uL   Immature Granulocytes 1 %   Abs Immature Granulocytes 0.04 0.00 - 0.07 K/uL    Comment: Performed at [facility], [address], [place], [place] [address]  Protime-INR     Status: None   Collection Time: 03/10/23 11:29 AM  Result Value Ref Range   Prothrombin Time 14.6 11.4 - 15.2 seconds   INR 1.1 0.8 - 1.2    Comment: (NOTE) INR goal varies based on device and disease states. Performed at [facility], [address], [place], [place] [address]   APTT     Status: None   Collection Time: 03/10/23 11:29 AM  Result Value Ref Range   aPTT 30 24 - 36 seconds    Comment: Performed at [facility], [address], [place], [place] [address]  I-Stat Lactic Acid, ED     Status: None   Collection Time: 03/10/23 11:40 AM  Result Value Ref Range   Lactic Acid, Venous 1.4  0.5 - 1.9 mmol/L  Blood Culture (routine x 2)     Status: None (Preliminary result)   Collection Time: 03/10/23  2:16 PM   Specimen: BLOOD RIGHT FOREARM  Result Value Ref Range   Specimen Description BLOOD RIGHT FOREARM    Special Requests      BOTTLES DRAWN AEROBIC ONLY Blood Culture results may not be optimal due to an inadequate volume of blood received in culture bottles   Culture      NO GROWTH < 24 HOURS Performed at [facility], [address] [place], [place] [address]    Report Status PENDING   I-Stat Lactic Acid, ED     Status: None   Collection Time: 03/10/23  2:37 PM  Result Value Ref Range   Lactic Acid, Venous 1.5 0.5 - 1.9 mmol/L  Troponin I (High Sensitivity)     Status: Abnormal   Collection Time: 03/10/23  2:44 PM  Result Value Ref Range   Troponin I (High Sensitivity) 19 (H) <18 ng/L    Comment: (NOTE) Elevated high sensitivity troponin I (hsTnI) values and significant  changes across serial measurements may suggest ACS but many other  chronic and acute conditions are known to elevate hsTnI results.  Refer to the "Links" section for chest pain algorithms and additional  guidance. Performed at [facility], [address], [place], [place] [address]   Hemoglobin A1c     Status: None   Collection Time: 03/10/23  6:05 PM  Result Value Ref Range   Hgb A1c MFr Bld 5.5 4.8 - 5.6 %    Comment: (  NOTE)         Prediabetes: 5.7 - 6.4         Diabetes: >6.4         Glycemic control for adults with diabetes: <7.0    Mean Plasma Glucose 111 mg/dL    Comment: (NOTE) Performed At: [facility] [address] [place], [place] [id number] Jolene Schimke MD WU:[id number]   Rapid urine drug screen (hospital performed)     Status: Abnormal   Collection Time: 03/10/23  6:25 PM  Result Value Ref Range   Opiates POSITIVE (A) NONE DETECTED   Cocaine NONE DETECTED NONE DETECTED   Benzodiazepines NONE DETECTED NONE DETECTED   Amphetamines NONE DETECTED  NONE DETECTED   Tetrahydrocannabinol POSITIVE (A) NONE DETECTED   Barbiturates NONE DETECTED NONE DETECTED    Comment: (NOTE) DRUG SCREEN FOR MEDICAL PURPOSES ONLY.  IF CONFIRMATION IS NEEDED FOR ANY PURPOSE, NOTIFY LAB WITHIN 5 DAYS.  LOWEST DETECTABLE LIMITS FOR URINE DRUG SCREEN Drug Class                     Cutoff (ng/mL) Amphetamine and metabolites    1000 Barbiturate and metabolites    200 Benzodiazepine                 200 Opiates and metabolites        300 Cocaine and metabolites        300 THC                            50 Performed at [facility], [address] [place], [place] [address]   Troponin I (High Sensitivity)     Status: Abnormal   Collection Time: 03/10/23  6:25 PM  Result Value Ref Range   Troponin I (High Sensitivity) 20 (H) <18 ng/L    Comment: (NOTE) Elevated high sensitivity troponin I (hsTnI) values and significant  changes across serial measurements may suggest ACS but many other  chronic and acute conditions are known to elevate hsTnI results.  Refer to the "Links" section for chest pain algorithms and additional  guidance. Performed at [facility], [address] [place], [place] [address]   Sodium, urine, random     Status: None   Collection Time: 03/10/23  6:25 PM  Result Value Ref Range   Sodium, Ur 95 mmol/L    Comment: Performed at [facility], [address] [place], [place] [address]  Creatinine, urine, random     Status: None   Collection Time: 03/10/23  6:25 PM  Result Value Ref Range   Creatinine, Urine 23 mg/dL    Comment: Performed at [facility], [address] [place], [place] [address]  Magnesium     Status: None   Collection Time: 03/10/23  6:25 PM  Result Value Ref Range   Magnesium 1.8 1.7 - 2.4 mg/dL    Comment: Performed at [facility], [address], [place], [place] [address]  Basic metabolic panel     Status: Abnormal   Collection Time: 03/11/23  1:31 AM  Result Value Ref  Range   Sodium 133 (L) 135 - 145 mmol/L   Potassium 3.4 (L) 3.5 - 5.1 mmol/L   Chloride 98 98 - 111 mmol/L   CO2 23 22 - 32 mmol/L   Glucose, Bld 104 (H) 70 - 99 mg/dL    Comment: Glucose reference range  applies only to samples taken after fasting for at least 8 hours.   BUN 32 (H) 8 - 23 mg/dL   Creatinine, Ser 3.01 (H) 0.61 - 1.24 mg/dL   Calcium 8.3 (L) 8.9 - 10.3 mg/dL   GFR, Estimated 43 (L) >60 mL/min    Comment: (NOTE) Calculated using the CKD-EPI Creatinine Equation (2021)    Anion gap 12 5 - 15    Comment: Performed at [facility], [address] [place], [place] [address]  CBC     Status: Abnormal   Collection Time: 03/11/23  1:31 AM  Result Value Ref Range   WBC 6.0 4.0 - 10.5 K/uL   RBC 5.06 4.22 - 5.81 MIL/uL   Hemoglobin 13.2 13.0 - 17.0 g/dL   HCT 32.3 55.7 - 32.2 %   MCV 80.8 80.0 - 100.0 fL   MCH 26.1 26.0 - 34.0 pg   MCHC 32.3 30.0 - 36.0 g/dL   RDW 02.5 (H) 42.7 - 06.2 %   Platelets 162 150 - 400 K/uL   nRBC 0.0 0.0 - 0.2 %    Comment: Performed at [facility], [address] [place], [place] [address]  CBG monitoring, ED     Status: Abnormal   Collection Time: 03/11/23  2:56 AM  Result Value Ref Range   Glucose-Capillary 119 (H) 70 - 99 mg/dL    Comment: Glucose reference range applies only to samples taken after fasting for at least 8 hours.  CBG monitoring, ED     Status: None   Collection Time: 03/11/23  8:08 AM  Result Value Ref Range   Glucose-Capillary 98 70 - 99 mg/dL    Comment: Glucose reference range applies only to samples taken after fasting for at least 8 hours.   CT Head Wo Contrast  Result Date: 03/10/2023 CLINICAL DATA:  Head trauma. EXAM: CT HEAD WITHOUT CONTRAST TECHNIQUE: Contiguous axial images were obtained from the base of the skull through the vertex without intravenous contrast. RADIATION DOSE REDUCTION: This exam was performed according to the departmental dose-optimization program which includes automated  exposure control, adjustment of the mA and/or kV according to patient size and/or use of iterative reconstruction technique. COMPARISON:  CT head dated November 17, 2022. FINDINGS: Brain: No evidence of acute infarction, hemorrhage, hydrocephalus, extra-axial collection or mass lesion/mass effect. Stable chronic microvascular ischemic changes. Vascular: Calcified atherosclerosis at the skull base. No hyperdense vessel. Skull: Normal. Negative for fracture or focal lesion. Sinuses/Orbits: No acute finding. Other: None. IMPRESSION: 1. No acute intracranial abnormality. 2. Stable chronic microvascular ischemic changes. Electronically Signed   By: Obie Dredge M.D.   On: 03/10/2023 13:04   DG Chest Port 1 View  Result Date: 03/10/2023 CLINICAL DATA:  Possible sepsis. EXAM: PORTABLE CHEST 1 VIEW COMPARISON:  Chest x-ray dated March 06, 2023. FINDINGS: Unchanged left chest wall pacemaker and cardiomegaly. No focal consolidation, pleural effusion, or pneumothorax. No acute osseous abnormality. IMPRESSION: No active disease. Electronically Signed   By: Obie Dredge M.D.   On: 03/10/2023 12:29    Pending Labs Unresulted Labs (From admission, onward)     Start     Ordered   03/10/23 1702  Urea nitrogen, urine  Once,   R        03/10/23 1701            Vitals/Pain Today's Vitals   03/11/23 0945 03/11/23 1000 03/11/23 1015 03/11/23 1028  BP: 101/68 99/72 98/82    Pulse: 68 65 71  Resp: (!) 35 17 (!) 24   Temp:    98 F (36.7 C)  TempSrc:    Oral  SpO2: 96% 97% 97%   Weight:      Height:      PainSc:        Isolation Precautions No active isolations  Medications Medications  lactated ringers infusion (0 mLs Intravenous Stopped 03/11/23 0656)  enoxaparin (LOVENOX) injection 40 mg (40 mg Subcutaneous Given 03/11/23 0134)  ondansetron (ZOFRAN) tablet 4 mg (has no administration in time range)    Or  ondansetron (ZOFRAN) injection 4 mg (has no administration in time range)  insulin aspart  (novoLOG) injection 0-9 Units ( Subcutaneous Not Given 03/11/23 0809)  lactated ringers bolus 1,000 mL (0 mLs Intravenous Stopped 03/10/23 1227)  lactated ringers bolus 1,000 mL (0 mLs Intravenous Stopped 03/10/23 1300)  lactated ringers bolus 500 mL (0 mLs Intravenous Stopped 03/10/23 1600)  lactated ringers bolus 500 mL (0 mLs Intravenous Stopped 03/10/23 1710)    Mobility walks     Focused Assessments Cardiac Assessment Handoff:    Lab Results  Component Value Date   CKTOTAL 1,079 (H) 11/20/2022   TROPONINI 0.04 (HH) 12/13/2016   Lab Results  Component Value Date   DDIMER 2.18 (H) 10/06/2021   Does the Patient currently have chest pain? No    R Recommendations: See Admitting Provider Note  Report given to:   Additional Notes:

## 2023-03-12 ENCOUNTER — Other Ambulatory Visit (HOSPITAL_COMMUNITY): Payer: Self-pay

## 2023-03-12 DIAGNOSIS — B0229 Other postherpetic nervous system involvement: Secondary | ICD-10-CM

## 2023-03-12 DIAGNOSIS — I959 Hypotension, unspecified: Secondary | ICD-10-CM | POA: Diagnosis not present

## 2023-03-12 DIAGNOSIS — E869 Volume depletion, unspecified: Secondary | ICD-10-CM | POA: Diagnosis not present

## 2023-03-12 DIAGNOSIS — I5020 Unspecified systolic (congestive) heart failure: Secondary | ICD-10-CM | POA: Diagnosis not present

## 2023-03-12 DIAGNOSIS — N453 Epididymo-orchitis: Secondary | ICD-10-CM

## 2023-03-12 DIAGNOSIS — R55 Syncope and collapse: Secondary | ICD-10-CM | POA: Diagnosis not present

## 2023-03-12 DIAGNOSIS — I4729 Other ventricular tachycardia: Secondary | ICD-10-CM | POA: Diagnosis not present

## 2023-03-12 LAB — GLUCOSE, CAPILLARY
Glucose-Capillary: 108 mg/dL — ABNORMAL HIGH (ref 70–99)
Glucose-Capillary: 104 mg/dL — ABNORMAL HIGH (ref 70–99)

## 2023-03-12 LAB — UREA NITROGEN, URINE: Urea Nitrogen, Ur: 183 mg/dL

## 2023-03-12 MED ORDER — DOXYCYCLINE HYCLATE 100 MG PO TABS: 100.0000 mg | ORAL_TABLET | Freq: Every day | ORAL | Status: DC

## 2023-03-12 MED ORDER — DEXTROSE 5 % IV SOLN: 250.0000 mg | INTRAVENOUS | Status: DC

## 2023-03-12 MED ORDER — ACETAMINOPHEN 325 MG PO TABS: 650.0000 mg | ORAL_TABLET | Freq: Four times a day (QID) | ORAL | Status: DC | PRN

## 2023-03-12 MED ORDER — LEVOFLOXACIN 500 MG PO TABS
500.0000 mg | ORAL_TABLET | Freq: Every day | ORAL | Status: DC
  Administered 2023-03-12: 500 mg via ORAL
  Filled 2023-03-12: qty 1

## 2023-03-12 MED ORDER — LEVOFLOXACIN 500 MG PO TABS
500.0000 mg | ORAL_TABLET | Freq: Every day | ORAL | 0 refills | Status: AC
Start: 2023-03-12 — End: 2023-03-21
  Filled 2023-03-12: qty 9, 9d supply, fill #0

## 2023-03-12 MED ORDER — TORSEMIDE 10 MG PO TABS
10.0000 mg | ORAL_TABLET | ORAL | 0 refills | Status: DC
  Filled 2023-03-12: qty 12, 28d supply, fill #0

## 2023-03-12 NOTE — TOC Transition Note (Signed)
Transition of Care [facility]) - CM/SW Discharge Note   Patient Details  Name: Edward Mullen MRN: [id number] Date of Birth: Nov 24, 1955  Transition of Care [facility] CM/SW Contact:  Leone Haven, RN Phone Number: 03/12/2023, 12:19 PM   Clinical Narrative:    Patient is for dc today, he has transportation.  TOC to fill meds, he is set up with [facility] for [facility].   Final next level of care: Home w Home Health Services Barriers to Discharge: No Barriers Identified   Patient Goals and CMS Choice   Choice offered to / list presented to : Patient  Discharge Placement                         Discharge Plan and Services Additional resources added to the After Visit Summary for   In-house Referral: NA Discharge Planning Services: CM Consult Post Acute Care Choice: Home Health          DME Arranged: N/A DME Agency: NA         HH Agency: NA        Social Determinants of Health (SDOH) Interventions SDOH Screenings   Food Insecurity: No Food Insecurity (03/11/2023)  Housing: Low Risk  (03/11/2023)  Transportation Needs: Unmet Transportation Needs (03/11/2023)  Utilities: Not At Risk (03/11/2023)  Alcohol Screen: Low Risk  (06/17/2022)  Depression (PHQ2-9): Low Risk  (12/04/2022)  Financial Resource Strain: Low Risk  (06/17/2022)  Physical Activity: Inactive (06/17/2022)  Social Connections: Unknown (03/04/2023)   Received from [facility]  Stress: No Stress Concern Present (06/17/2022)  Tobacco Use: High Risk (03/10/2023)     Readmission Risk Interventions    03/12/2023   12:00 PM  Readmission Risk Prevention Plan  Transportation Screening Complete  PCP or Specialist Appt within 3-5 Days Complete  HRI or Home Care Consult Complete  Palliative Care Screening Not Applicable  Medication Review (RN Care Manager) Complete

## 2023-03-12 NOTE — Discharge Summary (Signed)
Physician Discharge Summary  Edward Mullen ZOX:[id number] DOB: 1956-01-11 DOA: 03/10/2023  PCP: Loura Back, NP  Admit date: 03/10/2023 Discharge date: 03/12/2023 30 Day Unplanned Readmission Risk Score    Flowsheet Row ED to Hosp-Admission (Current) from 03/10/2023 in [facility] 3E HF PCU  30 Day Unplanned Readmission Risk Score (%) 25.04 Filed at 03/12/2023 0801       This score is the patient's risk of an unplanned readmission within 30 days of being discharged (0 -100%). The score is based on dignosis, age, lab data, medications, orders, and past utilization.   Low:  0-14.9   Medium: 15-21.9   High: 22-29.9   Extreme: 30 and above          Admitted From: Home Disposition: Home  Recommendations for Outpatient Follow-up:  Follow up with PCP in 1-2 weeks Please obtain BMP/CBC in one week Follow up with urology in 1 week Follow-up with cardiology in 1 to 2 weeks as they have scheduled. Please follow up with your PCP on the following pending results: Unresulted Labs (From admission, onward)     Start     Ordered   03/12/23 0500  CBC  Daily,   R      03/11/23 1643   03/12/23 0500  Basic metabolic panel  Daily,   R      03/11/23 1643   03/10/23 1702  Urea nitrogen, urine  Once,   R        03/10/23 1701              Home Health: None Equipment/Devices: None  Discharge Condition: Stable CODE STATUS: Full code Diet recommendation: Cardiac  Subjective: Seen and examined.  Other than the scrotal pain, no other complaint.  He walked well with the nurses and did not have any dizziness and does not have any orthostatic hypotension either.  Brief/Interim Summary: Edward Mullen is a 67 y.o. male with medical history significant of coronary artery disease, CKD 3A HFrEF, polysubstance abuse, hypertension, non-ischemic cardiomyopathy, history of cardiogenic shock and PEA arrest April 2024 presented with hypotension, syncope/presyncope.  Details below.   Syncope  versus presyncope Hypotension, orthostatic versus vasovagal -Reported truly syncopal event, transient, with systolic blood pressure noted to be in the 60s -Questionable orthostatics versus vasodilation/vasovagal in the setting of dehydration and hypovolemia -CT head unremarkable -initially all his antihypertensives including amiodarone, Coreg and mexiletine were held.  He was seen by cardiology and no cardiac reason for his syncope was found.  Cardiology resumed Coreg and recommended resuming mexiletine and amiodarone at previous home dose and reduced his torsemide from 20 mg p.o. daily to 10 mg only 3 times weekly.  He has not started taking Entresto and cardiology recommended not resuming that.  He does not have any more orthostatic hypotension and he is cleared by cardiology for discharge.  He is being discharged in stable condition.   Systolic heart failure with reduced ejection fraction, EF 25 to 30%  Cardiogenic shock (HCC), history of -History of cardiogenic shock and PEA arrest during April 2024 in the setting of illicit substance abuse (cocaine/benzos/THC) -Last known echo May 2024 -Not in acute exacerbation -Recent history of cardiogenic shock and cardiac arrest per documentation after illicit substance abuse   COPD (chronic obstructive pulmonary disease) (HCC) Stable from a respiratory standpoint, no acute exacerbation Continue home inhalers   Type 2 diabetes mellitus well controlled - with stage 3A chronic kidney disease, without long-term current use of insulin (HCC) Continue sliding scale  insulin, hypoglycemic protocol  A1C 5.5   AKI on Chronic kidney disease stage 3A Hypokalemia mild Baseline creatinine around 1.2-1.7 Creatinine 2.3 at intake -down trended appropriately and currently 1.23.   Polysubstance abuse [facility]) Patient self reports marijuana use only However, UDS positive for opiates and THC (previously also positive for cocaine in April)  Acute urinary  retention/acute right-sided epididymoorchitis: It appears that patient had presented to ED on 03/06/2023 for acute urinary retention and during that visit, he was also diagnosed with right epididymal orchitis and was discharged on Levaquin along with Foley catheter with instructions to follow-up with urology.  It is unsure whether patient completed 10 days of Levaquin or not as he is very poor historian.  He continued to complain of scrotal pain so ultrasound scrotum was done once again which confirmed similar diagnosis and he received 1 dose of IV Rocephin and Levaquin.  He is now being discharged on 9 more days of Levaquin based on the recommendations from up-to-date as he is not sexually active.  He is being discharged with Foley catheter with instructions to follow-up with urology in 1 week for voiding trial and we are resuming his Flomax.    Discharge Diagnoses:  Principal Problem:   Hypotension Active Problems:   Syncope   HTN (hypertension)   Polysubstance abuse (HCC)   NSVT (nonsustained ventricular tachycardia) (HCC)   CKD (chronic kidney disease) stage 3, GFR 30-59 ml/min (HCC)   Type 2 diabetes mellitus with stage 3b chronic kidney disease, without long-term current use of insulin (HCC)   COPD (chronic obstructive pulmonary disease) (HCC)   Cardiogenic shock (HCC)   HFrEF (heart failure with reduced ejection fraction) (HCC)   Volume depletion   Epididymoorchitis    Discharge Instructions   Allergies as of 03/12/2023   No Known Allergies      Medication List     STOP taking these medications    Entresto 24-26 MG Generic drug: sacubitril-valsartan       TAKE these medications    acetaminophen 500 MG tablet Commonly known as: TYLENOL Take 2 tablets (1,000 mg total) by mouth every 8 (eight) hours as needed. What changed: reasons to take this   albuterol (2.5 MG/3ML) 0.083% nebulizer solution Commonly known as: PROVENTIL USE ONE VIAL (2.5 MG TOTAL) BY NEBULIZATION  EVERY 6 (SIX) HOURS AS NEEDED FOR SHORTNESS OF BREATH OR WHEEZING. What changed: See the new instructions.   albuterol 108 (90 Base) MCG/ACT inhaler Commonly known as: VENTOLIN HFA INHALE 1 TO 2 PUFFS BY MOUTH EVERY 6 HOURS AS NEEDED FOR WHEEZING OR SHORTNESS OF BREATH What changed: See the new instructions.   amiodarone 200 MG tablet Commonly known as: PACERONE Take 1 tablet (200 mg total) by mouth daily.   Aspirin Low Dose 81 MG tablet Generic drug: aspirin EC TAKE 1 TABLET (81 MG TOTAL) BY MOUTH DAILY. SWALLOW WHOLE(AM) What changed: See the new instructions.   Breo Ellipta 100-25 MCG/ACT Aepb Generic drug: fluticasone furoate-vilanterol Inhale 1 puff into the lungs daily.   carvedilol 3.125 MG tablet Commonly known as: Coreg Take 1 tablet (3.125 mg total) by mouth 2 (two) times daily.   empagliflozin 10 MG Tabs tablet Commonly known as: Jardiance Take 1 tablet (10 mg total) by mouth daily.   gabapentin 100 MG capsule Commonly known as: Neurontin Take 2 capsules (200 mg total) by mouth 3 (three) times daily. What changed: when to take this   levofloxacin 500 MG tablet Commonly known as: LEVAQUIN Take 1 tablet (  500 mg total) by mouth daily for 9 days.   mexiletine 150 MG capsule Commonly known as: MEXITIL Take 1 capsule (150 mg total) by mouth 2 (two) times daily.   rosuvastatin 5 MG tablet Commonly known as: CRESTOR TAKE 1 TABLET (5 MG TOTAL) BY MOUTH DAILY. What changed: how much to take   tamsulosin 0.4 MG Caps capsule Commonly known as: FLOMAX TAKE ONE CAPSULE BY MOUTH ONCE DAILY AFTER SUPPER What changed: See the new instructions.   torsemide 10 MG tablet Commonly known as: DEMADEX Take 1 tablet (10 mg total) by mouth every Monday, Wednesday, and Friday. May take additional doses as needed for lower extremity edema. What changed:  medication strength how much to take when to take this additional instructions   Vitamin D3 50 MCG (2000 UT) Tabs Take  2,000 Units by mouth daily.        Follow-up Information     [facility] Follow up on 03/26/2023.   Specialty: Cardiology Why: 10:30AM. Post hospital follow up. Parking code [phone] Contact information: [address] [place] [address] [phone]        Loura Back, NP Follow up in 1 week(s).   Specialty: Nurse Practitioner Contact information: [address] [place] [address] [phone]                No Known Allergies  Consultations: Cardiology   Procedures/Studies: US SCROTUM W/DOPPLER  Result Date: 03/12/2023 CLINICAL DATA:  Scrotal pain EXAM: SCROTAL ULTRASOUND DOPPLER ULTRASOUND OF THE TESTICLES TECHNIQUE: Complete ultrasound examination of the testicles, epididymis, and other scrotal structures was performed. Color and spectral Doppler ultrasound were also utilized to evaluate blood flow to the testicles. COMPARISON:  Scrotal ultrasound 03/06/2023 FINDINGS: Right testicle Measurements: 4.5 x 2.7 x 3.3 cm. No mass or microlithiasis visualized. Highly vascular. Left testicle Measurements: 3.8 x 2.3 x 2.7 cm. No mass or microlithiasis visualized. Right epididymis:  Heterogenous enlarged and hyperemic. Left epididymis:  Normal in size and appearance. Hydrocele: Trace left hydrocele. Moderate multiloculated right hydrocele now visualized. Varicocele:  None visualized. Pulsed Doppler interrogation of both testes demonstrates normal low resistance arterial and venous waveforms bilaterally. Redemonstrated simple cyst measuring 3.2 cm posterior to the left testis. IMPRESSION: 1. Findings consistent with right epididymoorchitis. 2. Moderate multiloculated right hydrocele, potential pyocele. Electronically Signed   By: Jasmine Pang M.D.   On: 03/12/2023 01:09   ECHOCARDIOGRAM COMPLETE  Result Date: 03/11/2023    ECHOCARDIOGRAM REPORT   Patient Name:   Edward Mullen Date of Exam: 03/11/2023 Medical Rec  #:  [id number]         Height:       72.0 in Accession #:    [id number]        Weight:       180.0 lb Date of Birth:  01-Dec-1955        BSA:          2.037 m Patient Age:    34 years          BP:           121/77 mmHg Patient Gender: M                 HR:           64 bpm. Exam Location:  Inpatient Procedure: 2D Echo, Cardiac Doppler, Color Doppler and Intracardiac            Opacification Agent Indications:  Syncope  History:        Patient has prior history of Echocardiogram examinations, most                 recent 11/18/2022. CHF and Cardiomyopathy, hx cardiac arrest, COPD                 and CKD, spinal stenosis, Arrythmias:SVT, LBBB and PVC,                 Signs/Symptoms:Syncope, Shortness of Breath and Fever; Risk                 Factors:Hypertension, polysubstance abuse, Diabetes,                 Dyslipidemia and Current Smoker.  Sonographer:    Wallie Char Referring Phys: [address] [place]  Sonographer Comments: Technically challenging study due to limited acoustic windows. Image acquisition challenging due to patient body habitus. IMPRESSIONS  1. Left ventricular ejection fraction, by estimation, is <20%. The left ventricle has severely decreased function. The left ventricle demonstrates global hypokinesis. No LV thrombus noted on contrast images. The left ventricular internal cavity size was  severely dilated. Left ventricular diastolic parameters are consistent with Grade I diastolic dysfunction (impaired relaxation).  2. Right ventricular systolic function is mildly reduced. The right ventricular size is normal. Tricuspid regurgitation signal is inadequate for assessing PA pressure.  3. Left atrial size was mildly dilated.  4. Right atrial size was mildly dilated.  5. The mitral valve is normal in structure. Mild mitral valve regurgitation. No evidence of mitral stenosis.  6. The aortic valve is tricuspid. Aortic valve regurgitation is mild to moderate. No aortic stenosis is present.  7. Aortic  dilatation noted. There is mild dilatation of the aortic root, measuring 40 mm. FINDINGS  Left Ventricle: Left ventricular ejection fraction, by estimation, is <20%. The left ventricle has severely decreased function. The left ventricle demonstrates global hypokinesis. Definity contrast agent was given IV to delineate the left ventricular endocardial borders. The left ventricular internal cavity size was severely dilated. There is no left ventricular hypertrophy. Left ventricular diastolic parameters are consistent with Grade I diastolic dysfunction (impaired relaxation). Right Ventricle: The right ventricular size is normal. No increase in right ventricular wall thickness. Right ventricular systolic function is mildly reduced. Tricuspid regurgitation signal is inadequate for assessing PA pressure. Left Atrium: Left atrial size was mildly dilated. Right Atrium: Right atrial size was mildly dilated. Pericardium: There is no evidence of pericardial effusion. Mitral Valve: The mitral valve is normal in structure. Mild mitral valve regurgitation. No evidence of mitral valve stenosis. MV peak gradient, 2.8 mmHg. The mean mitral valve gradient is 1.0 mmHg. Tricuspid Valve: The tricuspid valve is normal in structure. Tricuspid valve regurgitation is not demonstrated. Aortic Valve: The aortic valve is tricuspid. Aortic valve regurgitation is mild to moderate. Aortic regurgitation PHT measures 539 msec. No aortic stenosis is present. Aortic valve mean gradient measures 7.0 mmHg. Aortic valve peak gradient measures 11.9  mmHg. Aortic valve area, by VTI measures 1.94 cm. Pulmonic Valve: The pulmonic valve was normal in structure. Pulmonic valve regurgitation is not visualized. Aorta: Aortic dilatation noted. There is mild dilatation of the aortic root, measuring 40 mm. Venous: The inferior vena cava was not well visualized. IAS/Shunts: No atrial level shunt detected by color flow Doppler. Additional Comments: A device lead is  visualized in the right ventricle.  LEFT VENTRICLE PLAX 2D LVIDd:  7.10 cm      Diastology LVIDs:         6.20 cm      LV e' medial:    4.72 cm/s LV PW:         1.10 cm      LV E/e' medial:  13.2 LV IVS:        1.20 cm      LV e' lateral:   9.59 cm/s LVOT diam:     1.80 cm      LV E/e' lateral: 6.5 LV SV:         59 LV SV Index:   29 LVOT Area:     2.54 cm  LV Volumes (MOD) LV vol d, MOD A2C: 289.0 ml LV vol d, MOD A4C: 484.0 ml LV vol s, MOD A2C: 209.0 ml LV vol s, MOD A4C: 333.0 ml LV SV MOD A2C:     80.0 ml LV SV MOD A4C:     484.0 ml LV SV MOD BP:      110.7 ml RIGHT VENTRICLE RV Basal diam:  4.40 cm RV S prime:     12.10 cm/s TAPSE (M-mode): 2.0 cm LEFT ATRIUM             Index        RIGHT ATRIUM           Index LA diam:        4.60 cm 2.26 cm/m   RA Area:     19.90 cm LA Vol (A2C):   62.9 ml 30.87 ml/m  RA Volume:   62.60 ml  30.73 ml/m LA Vol (A4C):   59.4 ml 29.16 ml/m LA Biplane Vol: 62.0 ml 30.43 ml/m  AORTIC VALVE AV Area (Vmax):    2.09 cm AV Area (Vmean):   2.03 cm AV Area (VTI):     1.94 cm AV Vmax:           172.67 cm/s AV Vmean:          123.667 cm/s AV VTI:            0.305 m AV Peak Grad:      11.9 mmHg AV Mean Grad:      7.0 mmHg LVOT Vmax:         141.75 cm/s LVOT Vmean:        98.700 cm/s LVOT VTI:          0.233 m LVOT/AV VTI ratio: 0.76 AI PHT:            539 msec  AORTA Ao Root diam: 4.00 cm MITRAL VALVE MV Area (PHT): 2.47 cm    SHUNTS MV Area VTI:   2.49 cm    Systemic VTI:  0.23 m MV Peak grad:  2.8 mmHg    Systemic Diam: 1.80 cm MV Mean grad:  1.0 mmHg MV Vmax:       0.84 m/s MV Vmean:      50.3 cm/s MV Decel Time: 307 msec MR Peak grad: 102.0 mmHg MR Mean grad: 49.0 mmHg MR Vmax:      505.00 cm/s MR Vmean:     326.0 cm/s MV E velocity: 62.40 cm/s MV A velocity: 70.23 cm/s MV E/A ratio:  0.89 Dalton McleanMD Electronically signed by Wilfred Lacy Signature Date/Time: 03/11/2023/5:42:48 PM    Final    CT Head Wo Contrast  Result Date: 03/10/2023 CLINICAL DATA:   Head trauma. EXAM: CT HEAD WITHOUT CONTRAST TECHNIQUE: Contiguous axial images were  obtained from the base of the skull through the vertex without intravenous contrast. RADIATION DOSE REDUCTION: This exam was performed according to the departmental dose-optimization program which includes automated exposure control, adjustment of the mA and/or kV according to patient size and/or use of iterative reconstruction technique. COMPARISON:  CT head dated November 17, 2022. FINDINGS: Brain: No evidence of acute infarction, hemorrhage, hydrocephalus, extra-axial collection or mass lesion/mass effect. Stable chronic microvascular ischemic changes. Vascular: Calcified atherosclerosis at the skull base. No hyperdense vessel. Skull: Normal. Negative for fracture or focal lesion. Sinuses/Orbits: No acute finding. Other: None. IMPRESSION: 1. No acute intracranial abnormality. 2. Stable chronic microvascular ischemic changes. Electronically Signed   By: Obie Dredge M.D.   On: 03/10/2023 13:04   DG Chest Port 1 View  Result Date: 03/10/2023 CLINICAL DATA:  Possible sepsis. EXAM: PORTABLE CHEST 1 VIEW COMPARISON:  Chest x-ray dated March 06, 2023. FINDINGS: Unchanged left chest wall pacemaker and cardiomegaly. No focal consolidation, pleural effusion, or pneumothorax. No acute osseous abnormality. IMPRESSION: No active disease. Electronically Signed   By: Obie Dredge M.D.   On: 03/10/2023 12:29   US SCROTUM W/DOPPLER  Result Date: 03/06/2023 CLINICAL DATA:  Right testicular pain and swelling EXAM: SCROTAL ULTRASOUND DOPPLER ULTRASOUND OF THE TESTICLES TECHNIQUE: Complete ultrasound examination of the testicles, epididymis, and other scrotal structures was performed. Color and spectral Doppler ultrasound were also utilized to evaluate blood flow to the testicles. COMPARISON:  None Available. FINDINGS: Right testicle Measurements: 4.1 x 2.6 x 3.3 cm. Diffusely heterogeneous and hypervascular. No mass or microlithiasis  visualized. Left testicle Measurements: 4.1 x 1.8 x 2.8 cm. No mass or microlithiasis visualized. Right epididymis:  Enlarged and hyperemic. Left epididymis:  Normal in size and appearance. Hydrocele: Moderate right hydrocele and small left hydrocele present. Within within the midline scrotal soft tissues there is a simple cystic area measuring 2.4 x 2.3 x 3.5 cm. Varicocele:  None visualized. Pulsed Doppler interrogation of both testes demonstrates normal low resistance arterial and venous waveforms bilaterally. IMPRESSION: 1. Heterogeneous and hypervascular right testicle and epididymis compatible with epididymo-orchitis. 2. Moderate right and small left hydroceles. 3. Simple cystic area within the midline scrotal soft tissues measuring 2.4 x 2.3 x 3.5 cm. Electronically Signed   By: Darliss Cheney M.D.   On: 03/06/2023 18:20   DG Chest Port 1 View  Result Date: 03/06/2023 CLINICAL DATA:  Shortness of breath. EXAM: PORTABLE CHEST 1 VIEW COMPARISON:  November 24, 2022. FINDINGS: Stable cardiomediastinal silhouette. Left-sided defibrillator is unchanged. No acute pulmonary disease is noted. Bony thorax is unremarkable. IMPRESSION: No active disease. Electronically Signed   By: Lupita Raider M.D.   On: 03/06/2023 17:22     Discharge Exam: Vitals:   03/12/23 0353 03/12/23 0738  BP: 112/77 109/66  Pulse: 70 70  Resp: (!) 25   Temp: 97.7 F (36.5 C) 97.9 F (36.6 C)  SpO2: 97% 97%   Vitals:   03/12/23 0000 03/12/23 0353 03/12/23 0604 03/12/23 0738  BP: 106/75 112/77  109/66  Pulse: 68 70  70  Resp: (!) 23 (!) 25    Temp: 98 F (36.7 C) 97.7 F (36.5 C)  97.9 F (36.6 C)  TempSrc: Oral Oral  Oral  SpO2: 96% 97%  97%  Weight:   82.2 kg   Height:        General: Pt is alert, awake, not in acute distress Cardiovascular: RRR, S1/S2 +, no rubs, no gallops Respiratory: CTA bilaterally, no wheezing, no rhonchi Abdominal:  Soft, NT, ND, bowel sounds + Extremities: no edema, no  cyanosis    The results of significant diagnostics from this hospitalization (including imaging, microbiology, ancillary and laboratory) are listed below for reference.     Microbiology: Recent Results (from the past 240 hour(s))  Urine Culture     Status: None   Collection Time: 03/06/23  4:33 PM   Specimen: Urine, Random  Result Value Ref Range Status   Specimen Description URINE, RANDOM  Final   Special Requests NONE Reflexed from [id number]  Final   Culture   Final    NO GROWTH Performed at [facility], [address] [place], [place] [address]    Report Status 03/08/2023 FINAL  Final  Blood Culture (routine x 2)     Status: None (Preliminary result)   Collection Time: 03/10/23 11:25 AM   Specimen: BLOOD  Result Value Ref Range Status   Specimen Description BLOOD RIGHT ANTECUBITAL  Final   Special Requests   Final    BOTTLES DRAWN AEROBIC AND ANAEROBIC Blood Culture adequate volume   Culture   Final    NO GROWTH 2 DAYS Performed at [facility], [address] [place], [place] [address]    Report Status PENDING  Incomplete  Blood Culture (routine x 2)     Status: None (Preliminary result)   Collection Time: 03/10/23  2:16 PM   Specimen: BLOOD RIGHT FOREARM  Result Value Ref Range Status   Specimen Description BLOOD RIGHT FOREARM  Final   Special Requests   Final    BOTTLES DRAWN AEROBIC ONLY Blood Culture results may not be optimal due to an inadequate volume of blood received in culture bottles   Culture   Final    NO GROWTH 2 DAYS Performed at [facility], [address] [place], [place] [address]    Report Status PENDING  Incomplete     Labs: BNP (last 3 results) Recent Labs    04/04/22 1202 06/07/22 0142 03/06/23 1633  BNP 212.5* 2,548.2* 2,094.2*   Basic Metabolic Panel: Recent Labs  Lab 03/06/23 1633 03/10/23 1129 03/10/23 1825 03/11/23 0131 03/12/23 0124  NA 132* 132*  --  133* 133*  K 4.1 3.8  --  3.4* 3.8  CL 95* 96*  --   98 98  CO2 20* 25  --  23 25  GLUCOSE 133* 110*  --  104* 90  BUN 19 35*  --  32* 20  CREATININE 1.34* 2.30*  --  1.71* 1.23  CALCIUM 9.0 8.8*  --  8.3* 8.6*  MG  --   --  1.8  --   --    Liver Function Tests: Recent Labs  Lab 03/06/23 1633 03/10/23 1129  AST 23 20  ALT 15 14  ALKPHOS 92 121  BILITOT 1.5* 0.6  PROT 7.7 7.9  ALBUMIN 3.0* 3.0*   No results for input(s): "LIPASE", "AMYLASE" in the last 168 hours. No results for input(s): "AMMONIA" in the last 168 hours. CBC: Recent Labs  Lab 03/06/23 1633 03/10/23 1129 03/11/23 0131 03/12/23 0124  WBC 9.7 8.7 6.0 6.3  NEUTROABS 8.1* 6.8  --   --   HGB 13.9 15.3 13.2 12.5*  HCT 43.0 48.8 40.9 38.8*  MCV 82.1 81.5 80.8 80.5  PLT 176 211 162 170   Cardiac Enzymes: No results for input(s): "CKTOTAL", "CKMB", "CKMBINDEX", "TROPONINI" in the last 168 hours. BNP: Invalid input(s): "POCBNP" CBG: Recent Labs  Lab 03/11/23 [phone]  03/11/23 1243 03/11/23 1538 03/11/23 2114 03/12/23 0606  GLUCAP 98 78 83 115* 108*   D-Dimer No results for input(s): "DDIMER" in the last 72 hours. Hgb A1c Recent Labs    03/10/23 1805  HGBA1C 5.5   Lipid Profile No results for input(s): "CHOL", "HDL", "LDLCALC", "TRIG", "CHOLHDL", "LDLDIRECT" in the last 72 hours. Thyroid function studies No results for input(s): "TSH", "T4TOTAL", "T3FREE", "THYROIDAB" in the last 72 hours.  Invalid input(s): "FREET3" Anemia work up No results for input(s): "VITAMINB12", "FOLATE", "FERRITIN", "TIBC", "IRON", "RETICCTPCT" in the last 72 hours. Urinalysis    Component Value Date/Time   COLORURINE COLORLESS (A) 03/10/2023 1127   APPEARANCEUR CLEAR 03/10/2023 1127   APPEARANCEUR Clear 12/16/2022 1150   LABSPEC 1.004 (L) 03/10/2023 1127   PHURINE 6.0 03/10/2023 1127   GLUCOSEU 150 (A) 03/10/2023 1127   HGBUR NEGATIVE 03/10/2023 1127   BILIRUBINUR NEGATIVE 03/10/2023 1127   BILIRUBINUR Negative 12/16/2022 1150   KETONESUR NEGATIVE 03/10/2023 1127    PROTEINUR NEGATIVE 03/10/2023 1127   UROBILINOGEN 0.2 12/31/2010 1003   NITRITE NEGATIVE 03/10/2023 1127   LEUKOCYTESUR NEGATIVE 03/10/2023 1127   Sepsis Labs Recent Labs  Lab 03/06/23 1633 03/10/23 1129 03/11/23 0131 03/12/23 0124  WBC 9.7 8.7 6.0 6.3   Microbiology Recent Results (from the past 240 hour(s))  Urine Culture     Status: None   Collection Time: 03/06/23  4:33 PM   Specimen: Urine, Random  Result Value Ref Range Status   Specimen Description URINE, RANDOM  Final   Special Requests NONE Reflexed from [id number]  Final   Culture   Final    NO GROWTH Performed at [facility], [address] [place], [place] [address]    Report Status 03/08/2023 FINAL  Final  Blood Culture (routine x 2)     Status: None (Preliminary result)   Collection Time: 03/10/23 11:25 AM   Specimen: BLOOD  Result Value Ref Range Status   Specimen Description BLOOD RIGHT ANTECUBITAL  Final   Special Requests   Final    BOTTLES DRAWN AEROBIC AND ANAEROBIC Blood Culture adequate volume   Culture   Final    NO GROWTH 2 DAYS Performed at [facility], [address], [place], [place] [address]    Report Status PENDING  Incomplete  Blood Culture (routine x 2)     Status: None (Preliminary result)   Collection Time: 03/10/23  2:16 PM   Specimen: BLOOD RIGHT FOREARM  Result Value Ref Range Status   Specimen Description BLOOD RIGHT FOREARM  Final   Special Requests   Final    BOTTLES DRAWN AEROBIC ONLY Blood Culture results may not be optimal due to an inadequate volume of blood received in culture bottles   Culture   Final    NO GROWTH 2 DAYS Performed at [facility], [address] [place], [place] [address]    Report Status PENDING  Incomplete    FURTHER DISCHARGE INSTRUCTIONS:   Get Medicines reviewed and adjusted: Please take all your medications with you for your next visit with your Primary MD   Laboratory/radiological data: Please request your Primary MD  to go over all hospital tests and procedure/radiological results at the follow up, please ask your Primary MD to get all Hospital records sent to his/her office.   In some cases, they will be blood work, cultures and biopsy results pending at the time of your discharge. Please request that your primary care M.D. goes through  all the records of your hospital data and follows up on these results.   Also Note the following: If you experience worsening of your admission symptoms, develop shortness of breath, life threatening emergency, suicidal or homicidal thoughts you must seek medical attention immediately by calling 911 or calling your MD immediately  if symptoms less severe.   You must read complete instructions/literature along with all the possible adverse reactions/side effects for all the Medicines you take and that have been prescribed to you. Take any new Medicines after you have completely understood and accpet all the possible adverse reactions/side effects.    Do not drive when taking Pain medications or sleeping medications (Benzodaizepines)   Do not take more than prescribed Pain, Sleep and Anxiety Medications. It is not advisable to combine anxiety,sleep and pain medications without talking with your primary care practitioner   Special Instructions: If you have smoked or chewed Tobacco  in the last 2 yrs please stop smoking, stop any regular Alcohol  and or any Recreational drug use.   Wear Seat belts while driving.   Please note: You were cared for by a hospitalist during your hospital stay. Once you are discharged, your primary care physician will handle any further medical issues. Please note that NO REFILLS for any discharge medications will be authorized once you are discharged, as it is imperative that you return to your primary care physician (or establish a relationship with a primary care physician if you do not have one) for your post hospital discharge needs so that they can  reassess your need for medications and monitor your lab values  Time coordinating discharge: Over 30 minutes  SIGNED:   Hughie Closs, MD  [facility] 03/12/2023, 11:37 AM *Please note that this is a verbal dictation therefore any spelling or grammatical errors are due to the "Dragon Medical One" system interpretation. If 7PM-7AM, please contact night-coverage [email or web address]

## 2023-03-12 NOTE — Progress Notes (Signed)
Heart Failure Navigator Progress Note  Assessed for Heart & Vascular TOC clinic readiness.  Patient does not meet criteria due to Advanced Heart Failure Team patient of Dr. McLean.   Navigator will sign off at this time.   Dawn Fields, BSN, RN Heart Failure Nurse Navigator Secure Chat Only   

## 2023-03-12 NOTE — Progress Notes (Addendum)
Patient Name: Edward Mullen Date of Encounter: 03/12/2023 [facility] Cardiologist: None   Interval Summary  .    Feeling better.  Breathing improved.  No lightheadedness or dizziness.  Continues to have pain from shingles and testicular pain.  Vital Signs .    Vitals:   03/12/23 0000 03/12/23 0353 03/12/23 0604 03/12/23 0738  BP: 106/75 112/77  109/66  Pulse: 68 70  70  Resp: (!) 23 (!) 25    Temp: 98 F (36.7 C) 97.7 F (36.5 C)  97.9 F (36.6 C)  TempSrc: Oral Oral  Oral  SpO2: 96% 97%  97%  Weight:   82.2 kg   Height:        Intake/Output Summary (Last 24 hours) at 03/12/2023 0855 Last data filed at 03/12/2023 0700 Gross per 24 hour  Intake 240 ml  Output 1025 ml  Net -785 ml      03/12/2023    6:04 AM 03/10/2023   11:06 AM 03/06/2023    4:34 PM  Last 3 Weights  Weight (lbs) 181 lb 4.8 oz 180 lb 183 lb  Weight (kg) 82.237 kg 81.647 kg 83.008 kg      Telemetry/ECG    03/10/23: ASVP.  Rate 67 bpm - Personally Reviewed  Physical Exam .    VS:  BP 109/66 (BP Location: Left Arm)   Pulse 70   Temp 97.9 F (36.6 C) (Oral)   Resp (!) 25   Ht 6' (1.829 m)   Wt 82.2 kg   SpO2 97%   BMI 24.59 kg/m  , BMI Body mass index is 24.59 kg/m. GENERAL:  Well appearing.  Frail HEENT: Pupils equal round and reactive, fundi not visualized, oral mucosa unremarkable NECK:  No jugular venous distention, waveform within normal limits, carotid upstroke brisk and symmetric, no bruits, no thyromegaly LUNGS:  Clear to auscultation bilaterally HEART:  RRR.  PMI not displaced or sustained,S1 and S2 within normal limits, no S3, no S4, no clicks, no rubs, no murmurs ABD:  Flat, positive bowel sounds normal in frequency in pitch, no bruits, no rebound, no guarding, no midline pulsatile mass, no hepatomegaly, no splenomegaly EXT:  2 plus pulses throughout, no edema, no cyanosis no clubbing SKIN:  No rashes no nodules NEURO:  Cranial nerves II through XII grossly intact,  motor grossly intact throughout PSYCH:  Cognitively intact, oriented to person place and time   Assessment & Plan .     Mr. Jeffs is a 22M with NICM, s/p BiV ICD, cocaine abuse, NSVT, PEA arrest admitted with syncope and hypotension.   # Syncope: # Hypotension:  SBP upper 80s to l00s. He is feeling much better after IV fluids.  Not orthostatic when vitals were just checked.  Doing OK so far after restarting carvedilol 3.125 mg twice daily.  He was taking torsemide every day at home.  We will change his torsemide to 10mg  MWF and additional 10mg  as needed for LE edema, shortness of breath or weight gain.  He gets his medications in blister packs from the pharmacy, so that will need to be updated.  He has not been on Entresto in years.  Given his hypotension on admission, would not add this back at this time.  His device was reportedly interrogated in the ED.  No arrhythmias at the time of his syncopal event.  He has short runs of SVT and NSVT.  Cath 10/2021 revealed 80% RPL and otherwise non-obstructive disease.  Urine tox was positive  for THC and opiates but none for cocaine.  # NICM: Euvolemic on exam.  Echo this admission revealed LVEF less than 20%.  Restarted carvedilol as above.  BP otherwise too low for GDMT.  Adding torsemide 10mg  MWF.   # VT:  Continue amiodarone and mexiletine.  No significant arrhythmia thus far on telemetry or EKG.  Electrolytes unremarkable.  Resuming carvedilol as above.  Short runs of NSVT.   # AKI: Renal function improving with hydration.  # Epididymoorchitis:  Patient continues to have significant pain and swelling.  Scrotal ultrasound consistent with epididymoorchitis.  Will give Ceftriaxone 250mg  IV x1 and doxycycline 100mg  daily x10 days.  Scrotal sling.  Tylenol prn.  Cold compress as needed.  # Shingles:  R shoulder pain.  Patient requested Calamine lotion, which has been ordered.  Consider other nerve pain meds per primary team.  For questions or  updates, please contact [facility] Please consult [email or web address] for contact info under        Signed, Chilton Si, MD

## 2023-03-12 NOTE — TOC Initial Note (Signed)
Transition of Care [facility]) - Initial/Assessment Note    Patient Details  Name: Edward Mullen MRN: [id number] Date of Birth: [birth year]  Transition of Care [facility]) CM/SW Contact:    Edward Haven, RN Phone Number: 03/12/2023, 12:10 PM  Clinical Narrative:                 Patient is from home with daughter, Edward Mullen, he has PCP, and he has insurance on file, he states he had HHPT just recently and would like to have it again, he could not remember the name of the agency.  NCM confirmed it was [facility].  Patient wants [facility] again.  Edward Mullen is able to take and soc will begin 24 to 48 hrs post dc.  , he has a walker and a cane at home, he states his daughter will transport him home today or his wife.  He states daughter is his support system, his wife does not live with him.  Pta he ambulates with walker. He gets his medications from [facility].   Expected Discharge Plan: Home w Home Health Services Barriers to Discharge: No Barriers Identified   Patient Goals and CMS Choice     Choice offered to / list presented to : Patient      Expected Discharge Plan and Services In-house Referral: NA Discharge Planning Services: CM Consult Post Acute Care Choice: Home Health Living arrangements for the past 2 months: Apartment Expected Discharge Date: 03/12/23               DME Arranged: N/A DME Agency: NA         HH Agency [facility]  Services PT, OT Representative spoke with at [facility] agency Edward Mullen Date HH Arranged 03/12/23     Time 1215        Prior Living Arrangements/Services Living arrangements for the past 2 months: Apartment Lives with:: Adult Children Patient language and need for interpreter reviewed:: Yes Do you feel safe going Mullen to the place where you live?: Yes      Need for Family Participation in Patient Care: Yes (Comment) Care giver support system in place?: Yes (comment) Current home services: DME (walker ,cane) Criminal Activity/Legal  Involvement Pertinent to Current Situation/Hospitalization: No - Comment as needed  Activities of Daily Living Home Assistive Devices/Equipment: Environmental consultant (specify type), Cane (specify quad or straight) ADL Screening (condition at time of admission) Patient's cognitive ability adequate to safely complete daily activities?: Yes Is the patient deaf or have difficulty hearing?: No Does the patient have difficulty seeing, even when wearing glasses/contacts?: No Does the patient have difficulty concentrating, remembering, or making decisions?: No Patient able to express need for assistance with ADLs?: Yes Does the patient have difficulty dressing or bathing?: No Independently performs ADLs?: Yes (appropriate for developmental age) Does the patient have difficulty walking or climbing stairs?: Yes Weakness of Legs: Both Weakness of Arms/Hands: None  Permission Sought/Granted Permission sought to share information with : Case Manager Permission granted to share information with : Yes, Verbal Permission Granted              Emotional Assessment Appearance:: Appears stated age Attitude/Demeanor/Rapport: Engaged Affect (typically observed): Appropriate Orientation: : Oriented to Self, Oriented to Place, Oriented to  Time, Oriented to Situation Alcohol / Substance Use: Not Applicable Psych Involvement: No (comment)  Admission diagnosis:  Volume depletion [E86.9] Hypotension [I95.9] Hypotension, unspecified hypotension type [I95.9] Patient Active Problem List   Diagnosis Date Noted   Epididymoorchitis 03/12/2023   HFrEF (  heart failure with reduced ejection fraction) (HCC) 03/11/2023   Volume depletion 03/11/2023   Hypotension 03/10/2023   Shingles rash 12/05/2022   Herpes zoster dermatitis 12/05/2022   Hyperkalemia 12/05/2022   Chest pain, musculoskeletal 12/05/2022   Increased anion gap metabolic acidosis 12/05/2022   Abnormal echocardiogram 11/24/2022   Malnutrition of moderate  degree (HCC) 11/19/2022   Cardiogenic shock (HCC) 11/18/2022   Cardiac arrest (HCC) 11/17/2022   LBBB (left bundle branch block) 10/21/2022   PVC (premature ventricular contraction) frequent 10/21/2022   COPD (chronic obstructive pulmonary disease) (HCC) 06/07/2022   Hyperlipemia 04/04/2022   First time seizure (HCC) 12/28/2021   Type 2 diabetes mellitus with stage 3b chronic kidney disease, without long-term current use of insulin (HCC) 10/05/2021   Hemoptysis    Acute exacerbation of CHF (congestive heart failure) (HCC) 10/04/2021   Urinary retention    Scrotal mass 08/01/2020   Nocturia 08/01/2020   Lumbar spinal stenosis 06/08/2019   Healthcare maintenance 05/11/2019   Housing problems 05/11/2019   Difficulty urinating 04/07/2018   Colon cancer screening 04/07/2018   Anemia 12/19/2016   CKD (chronic kidney disease) stage 3, GFR 30-59 ml/min (HCC) 12/13/2016   Syncope 12/12/2016   Chronic systolic heart failure (HCC) 09/06/2016   NSVT (nonsustained ventricular tachycardia) (HCC) 09/06/2016   Non-ischemic cardiomyopathy (HCC) 08/27/2016   Non-obstructive hypertrophic cardiomyopathy (HCC) 08/27/2016   Mitral regurgitation 08/27/2016   Polysubstance abuse (HCC)    AKI (acute kidney injury) (HCC) 08/23/2016   Tobacco abuse 09/19/2014   HTN (hypertension) 03/28/2011   Bilateral lower extremity pain 03/28/2011   PCP:  Edward Back, NP Pharmacy:   [facility] - [place], [place] - [address] [place] [address] Phone: [phone] Fax: [phone]  [facility] [address] [place] [address] Phone: [phone] Fax: [phone]     Social Determinants of Health (SDOH) Social History: SDOH Screenings   Food Insecurity: No Food Insecurity (03/11/2023)  Housing: Low Risk  (03/11/2023)  Transportation Needs: Unmet Transportation Needs (03/11/2023)  Utilities: Not At Risk (03/11/2023)  Alcohol  Screen: Low Risk  (06/17/2022)  Depression (PHQ2-9): Low Risk  (12/04/2022)  Financial Resource Strain: Low Risk  (06/17/2022)  Physical Activity: Inactive (06/17/2022)  Social Connections: Unknown (03/04/2023)   Received from [facility]  Stress: No Stress Concern Present (06/17/2022)  Tobacco Use: High Risk (03/10/2023)   SDOH Interventions:     Readmission Risk Interventions    03/12/2023   12:00 PM  Readmission Risk Prevention Plan  Transportation Screening Complete  PCP or Specialist Appt within 3-5 Days Complete  HRI or Home Care Consult Complete  Palliative Care Screening Not Applicable  Medication Review (RN Care Manager) Complete

## 2023-03-12 NOTE — Progress Notes (Signed)
Mobility Specialist Progress Note:    03/12/23 1128  Mobility  Activity Ambulated with assistance in hallway  Level of Assistance Contact guard assist, steadying assist  Assistive Device Front wheel walker  Distance Ambulated (ft) 100 ft  Activity Response Tolerated well  Mobility Referral Yes  $Mobility charge 1 Mobility  Mobility Specialist Start Time (ACUTE ONLY) 1108  Mobility Specialist Stop Time (ACUTE ONLY) 1122  Mobility Specialist Time Calculation (min) (ACUTE ONLY) 14 min   Pt received in bed, agreeable to ambulate. Pt needed x2 attempts to stand but needed no physical assistance. Session cut short d/t pt c/o strong pain in bilat legs. Returned to bed w/ ice packs on his legs and call bell in reach. All needs met.  Thompson Grayer Mobility Specialist  Please contact vis Secure Chat or  Rehab Office [phone]

## 2023-03-12 NOTE — Plan of Care (Signed)

## 2023-03-12 NOTE — Progress Notes (Signed)
Explained discharge instructions to patient. Reviewed follow up appointment and next medication administration times. Also reviewed education. Patient verbalized having an understanding for instructions given. All belongings are in the patient's possession. Exchanged foley bag prior to discharging. Picking up patient's TOC meds on the way to the discharge. Patient will await ride in the lounge.  IV removed.  No other needs verbalized.

## 2023-03-13 ENCOUNTER — Telehealth: Payer: Self-pay

## 2023-03-13 NOTE — Transitions of Care (Post Inpatient/ED Visit) (Signed)
03/13/2023  Name: Edward Mullen MRN: [id number] DOB: 1956/03/26  Today's TOC FU Call Status: Today's TOC FU Call Status:: Successful TOC FU Call Completed TOC FU Call Complete Date: 03/13/23  Transition Care Management Follow-up Telephone Call Date of Discharge: 03/12/23 Discharge Facility: [facility]) Type of Discharge: Inpatient Admission Primary Inpatient Discharge Diagnosis:: hypotension How have you been since you were released from the hospital?: Better Any questions or concerns?: No  Items Reviewed: Did you receive and understand the discharge instructions provided?: Yes Medications obtained,verified, and reconciled?: Yes (Medications Reviewed) Any new allergies since your discharge?: No Dietary orders reviewed?: Yes Do you have support at home?: Yes People in Home: spouse  Medications Reviewed Today: Medications Reviewed Today     Reviewed by Karena Addison, LPN (Licensed Practical Nurse) on 03/13/23 at [phone]  Med List Status: <None>   Medication Order Taking? Sig Documenting Provider Last Dose Status Informant  acetaminophen (TYLENOL) 500 MG tablet [id number] No Take 2 tablets (1,000 mg total) by mouth every 8 (eight) hours as needed.  Patient taking differently: Take 1,000 mg by mouth every 8 (eight) hours as needed for moderate pain.   Rocky Morel, DO 03/08/2023 Active Self, Pharmacy Records, Child  albuterol (PROVENTIL) (2.5 MG/3ML) 0.083% nebulizer solution [id number] No USE ONE VIAL (2.5 MG TOTAL) BY NEBULIZATION EVERY 6 (SIX) HOURS AS NEEDED FOR SHORTNESS OF BREATH OR WHEEZING.  Patient taking differently: Take 2.5 mg by nebulization every 6 (six) hours as needed for wheezing or shortness of breath.   Morene Crocker, MD 03/07/2023 Active Self, Pharmacy Records, Child  albuterol (VENTOLIN HFA) 108 (90 Base) MCG/ACT inhaler [id number] No INHALE 1 TO 2 PUFFS BY MOUTH EVERY 6 HOURS AS NEEDED FOR WHEEZING OR SHORTNESS OF BREATH  Patient taking  differently: Inhale 1-2 puffs into the lungs every 6 (six) hours as needed for wheezing or shortness of breath.   Morene Crocker, MD 03/07/2023 Active Self, Pharmacy Records, Child  amiodarone (PACERONE) 200 MG tablet [id number] No Take 1 tablet (200 mg total) by mouth daily. Zannie Cove, MD 03/10/2023 Active Self, Pharmacy Records, Child  ASPIRIN LOW DOSE 81 MG tablet [id number] No TAKE 1 TABLET (81 MG TOTAL) BY MOUTH DAILY. SWALLOW WHOLE(AM)  Patient taking differently: Take 81 mg by mouth daily.   Jacklynn Ganong, [place] 03/10/2023 Active Self, Pharmacy Records, Child  BREO ELLIPTA 100-25 MCG/ACT AEPB [id number] No Inhale 1 puff into the lungs daily. Morene Crocker, MD 03/07/2023 Active Self, Pharmacy Records, Child  carvedilol (COREG) 3.125 MG tablet [id number] No Take 1 tablet (3.125 mg total) by mouth 2 (two) times daily. [place], [place] 03/10/2023 Expired 03/10/23 2359 Pharmacy Records, Self, Child  Cholecalciferol (VITAMIN D3) 50 MCG (2000 UT) TABS [id number] No Take 2,000 Units by mouth daily. [provider] 03/10/2023 Active Pharmacy Records, Self, Child  empagliflozin (JARDIANCE) 10 MG TABS tablet [id number] No Take 1 tablet (10 mg total) by mouth daily. Zannie Cove, MD Taking Active Self, Pharmacy Records, Child  gabapentin (NEURONTIN) 100 MG capsule [id number] No Take 2 capsules (200 mg total) by mouth 3 (three) times daily.  Patient taking differently: Take 200 mg by mouth 2 (two) times daily.   Rocky Morel, DO 03/10/2023 Expired 03/10/23 2359 Self, Pharmacy Records, Child  levofloxacin (LEVAQUIN) 500 MG tablet [id number]  Take 1 tablet (500 mg total) by mouth daily for 9 days. Hughie Closs, MD  Active   mexiletine (MEXITIL) 150 MG capsule [id number] No Take 1 capsule (150 mg total) by mouth 2 (  two) times daily. Duke Salvia, MD 03/10/2023 Active Self, Pharmacy Records, Child  rosuvastatin (CRESTOR) 5 MG tablet [id number] No TAKE 1 TABLET (5 MG TOTAL) BY  MOUTH DAILY.  Patient taking differently: Take 5 mg by mouth daily.   Laurey Morale, MD 03/09/2023 Active Self, Pharmacy Records, Child  tamsulosin (FLOMAX) 0.4 MG CAPS capsule [id number] No TAKE ONE CAPSULE BY MOUTH ONCE DAILY AFTER SUPPER  Patient taking differently: Take 0.8 mg by mouth daily.   Belva Agee, MD 03/10/2023 Active Self, Pharmacy Records, Child  torsemide (DEMADEX) 10 MG tablet [id number]  Take 1 tablet (10 mg total) by mouth every Monday, Wednesday, and Friday. May take additional doses as needed for lower extremity edema. Hughie Closs, MD  Active      Discontinued 01/03/23 1357 (Entry Error)             Home Care and Equipment/Supplies: Were Home Health Services Ordered?: Yes Name of Home Health Agency:: [facility] Has Agency set up a time to come to your home?: No Any new equipment or medical supplies ordered?: NA  Functional Questionnaire: Do you need assistance with bathing/showering or dressing?: No Do you need assistance with meal preparation?: No Do you need assistance with eating?: No Do you have difficulty maintaining continence: No Do you need assistance with getting out of bed/getting out of a chair/moving?: No Do you have difficulty managing or taking your medications?: No  Follow up appointments reviewed: PCP Follow-up appointment confirmed?: No (patient has transferred PCP) MD Provider Line Number:[phone] Given: No Specialist Hospital Follow-up appointment confirmed?: Yes Date of Specialist follow-up appointment?: 03/26/23 Follow-Up Specialty Provider:: cardio Do you need transportation to your follow-up appointment?: No Do you understand care options if your condition(s) worsen?: Yes-patient verbalized understanding    SIGNATURE Karena Addison, LPN [facility] Nurse Health Advisor Direct Dial [phone]

## 2023-03-13 NOTE — Telephone Encounter (Signed)
Make sure he has followup

## 2023-03-14 ENCOUNTER — Telehealth (HOSPITAL_COMMUNITY): Payer: Self-pay

## 2023-03-14 ENCOUNTER — Other Ambulatory Visit (HOSPITAL_COMMUNITY): Payer: Self-pay

## 2023-03-14 NOTE — Telephone Encounter (Signed)
Call from [facility] street [facility] from nurse who states that patient is being seen in office at the moment with BP of 77/56- reports that he was just discharged from hospital a couple days ago   Restarted medications- including Torsemide 10mg  MWF, carvedilol 3.125mg  bid, and jardiance 10mg  that could potentially lower BP  No weight gain- lost 2 pounds since the 29th,   No swelling, and denies any symptoms.   Advised her I would forward to provider for review.   Call back to [facility] street [facility] -Nurse Melodie Bouillon  [phone]   Would like to know what to do about medications and patients BP

## 2023-03-17 NOTE — Progress Notes (Signed)
Advanced Heart Failure Clinic Note   Primary Care: Loura Back, NP HF Cardiologist: Dr. Shirlee Latch   HPI: Edward Mullen is a 67 y.o. male with history of HTN, DM, chronic systolic CHF, and polysubstance abuse.   Admitted 08/23/16 with acute systolic CHF in setting of substance abuse. Echo was done, showing EF 15-20%.  Diuresed with IV lasix and meds adjusted as tolerated.  UDS + for cocaine on admission. Cardiac cath showed coronary disease but not significant enough to explain cardiomyopathy. Down 23 lbs from admission weight with diuresis.  Discharge weight 193 lbs.  He was positive for cocaine again in 2/18.    He was admitted with syncope in 5/18 after using cocaine.  He was noted to have NSVT on telemetry.  Syncope suspected due to VT, no ICD given active substance abuse but had Lifevest placed. EF 50% on 8/18 echo, so Lifevest subsequently removed.  He started using cocaine again and stopped his cardiac medications. In 4/22, he was admitted with acute on chronic systolic CHF.  Frequent NSVT was noted and amiodarone was started.  He had echo showing EF 20-25% with normal RV.  LHC showed 80% PLV stenosis, managed medically.   Lost to f/u in the [facility]. Not seen back since 5/22.  Admitted 3/23 with a/c CHF and PNA. Echo with EF 20-25%, RV mildly reduced. Started on milrinone and IV lasix. RHC showed mildly elevated PCWP and LVEDP, CI 1.9. Milrinone weaned off and GDMT titrated. Had NSVT/VT, discharged home with LifeVest, weight 202 lbs.  Echo 8/23 showed EF 25-30%, global hypokinesis, moderate LV dilation, mild LVH, mildly decreased RV systolic function. Referred to EP for CRT-D consideration.  S/p CRT-P 12/23  Admitted 4/24 with OOH arrest, found unresponsive in his car. Required 8 mins CPR. Suspected cocaine OD.  UDS + for benzos, THC, and cocaine. Developed cardiogenic shock postcardiac arrest. Required intubation and pressors in CCU. + for rhinovirus during admission. Pressors weaned  off. Discharged home on coreg, mexilitine, amiodarone, and torsemide. Losartan and spiro held with hyperkalemia.   Recently admitted 7/24 with lightheadedness, hypotension and syncope. GDMT initially held but was all restarted at discharge. Suspected 2/2 dehydration/hypovolemia. Torsemide was decreased from 20 mg daily to 10 mg 3x/week. Discharge weight 181 lbs.   Today he returns for post hospital follow up. Overall feeling ***. Denies palpitations, CP, dizziness, edema, or PND/Orthopnea. *** SOB. Appetite ok. No fever or chills. Weight at home *** pounds. Taking all medications. ETOH, smoking ***   ECG (personally reviewed): ***  Device interrogation ***  ROS: All systems reviewed and negative except as per HPI.   Social History: Lives in [place] with daughter. Prior heavy ETOH, now quit.  Quit smoking 10/20. Has quit using cocaine.  Active marijuana.   Family History  Problem Relation Age of Onset   Stroke Mother 55   Heart disease Father 41   Colon cancer Neg Hx    Colon polyps Neg Hx    Esophageal cancer Neg Hx    Stomach cancer Neg Hx    Rectal cancer Neg Hx    Past Medical History: 1. HTN  2. Type II diabetes 3. Cocaine abuse 4. Active smoking, suspect COPD.  5. Cardiomyopathy: Nonischemic.  - Echo (1/18) with EF 15-20%, moderate LVH, moderate AI, moderate to severe MR, normal RV size with mildly decreased systolic function, PASP 44 mmHg.  - LHC/RHC (1/18): 80% stenosis in PLV branch.  Mean RA 5, PA 47/20 mean 32, mean PCWP 22, CI 2.01.  -  Echo (8/18): EF 50% with moderate LVH, diffuse hypokinesis, normal RV size and systolic function, mild AI, trivial MR.  - Echo (4/22): EF 20-25%, severe LV dilation, normal RV. - Echo (2/23):  EF 20-25%, RV mildly reduced.  - Echo (8/23): EF 25-30%, global hypokinesis, moderate LV dilation, mild LVH, mildly decreased RV systolic function.  6. CAD: LHC (1/18) with 80% stenosis in a branch of the PLV (not enough CAD to explain  cardiomyopathy).  - LHC (4/22): PLV 80%, 25% mLAD.  - R/LHC (2/23): 1st RPL 80%, 30% mLAD; mildly elevated PCWP and LVEDP, normal RA pressures, CI 1.9 - Echo 10/23 EF 20-25% RV moderately reduced.  - Echo 4/24 EF25-30%, normal RV.  - Echo 7/24 EF <20%, GIDD, RV mildly reduced 7. Mitral regurgitation: Moderate to severe on 1/18 echo, probably functional.  Trivial MR on 8/18 echo.  8. CKD: Stage 3.  9. Syncope: 5/18 in setting of cocaine use, concern for VT.  10. Lower extremity arterial dopplers (7/18): No significant PAD.  11. Sciatica 12. NSVT 13. CKD stage 3  Current Outpatient Medications  Medication Sig Dispense Refill   acetaminophen (TYLENOL) 500 MG tablet Take 2 tablets (1,000 mg total) by mouth every 8 (eight) hours as needed. (Patient taking differently: Take 1,000 mg by mouth every 8 (eight) hours as needed for moderate pain.) 100 tablet 0   albuterol (PROVENTIL) (2.5 MG/3ML) 0.083% nebulizer solution USE ONE VIAL (2.5 MG TOTAL) BY NEBULIZATION EVERY 6 (SIX) HOURS AS NEEDED FOR SHORTNESS OF BREATH OR WHEEZING. (Patient taking differently: Take 2.5 mg by nebulization every 6 (six) hours as needed for wheezing or shortness of breath.) 75 mL 2   albuterol (VENTOLIN HFA) 108 (90 Base) MCG/ACT inhaler INHALE 1 TO 2 PUFFS BY MOUTH EVERY 6 HOURS AS NEEDED FOR WHEEZING OR SHORTNESS OF BREATH (Patient taking differently: Inhale 1-2 puffs into the lungs every 6 (six) hours as needed for wheezing or shortness of breath.) 18 g 2   amiodarone (PACERONE) 200 MG tablet Take 1 tablet (200 mg total) by mouth daily. 30 tablet 0   ASPIRIN LOW DOSE 81 MG tablet TAKE 1 TABLET (81 MG TOTAL) BY MOUTH DAILY. SWALLOW WHOLE(AM) (Patient taking differently: Take 81 mg by mouth daily.) 30 tablet 11   BREO ELLIPTA 100-25 MCG/ACT AEPB Inhale 1 puff into the lungs daily. 30 each 11   carvedilol (COREG) 3.125 MG tablet Take 1 tablet (3.125 mg total) by mouth 2 (two) times daily. 60 tablet 11   Cholecalciferol  (VITAMIN D3) 50 MCG (2000 UT) TABS Take 2,000 Units by mouth daily.     empagliflozin (JARDIANCE) 10 MG TABS tablet Take 1 tablet (10 mg total) by mouth daily. 30 tablet 0   gabapentin (NEURONTIN) 100 MG capsule Take 2 capsules (200 mg total) by mouth 3 (three) times daily. (Patient taking differently: Take 200 mg by mouth 2 (two) times daily.) 90 capsule 0   levofloxacin (LEVAQUIN) 500 MG tablet Take 1 tablet (500 mg total) by mouth daily for 9 days. 9 tablet 0   mexiletine (MEXITIL) 150 MG capsule Take 1 capsule (150 mg total) by mouth 2 (two) times daily. 180 capsule 3   rosuvastatin (CRESTOR) 5 MG tablet TAKE 1 TABLET (5 MG TOTAL) BY MOUTH DAILY. (Patient taking differently: Take 5 mg by mouth daily.) 90 tablet 3   tamsulosin (FLOMAX) 0.4 MG CAPS capsule TAKE ONE CAPSULE BY MOUTH ONCE DAILY AFTER SUPPER (Patient taking differently: Take 0.8 mg by mouth daily.) 30 capsule 2  torsemide (DEMADEX) 10 MG tablet Take 1 tablet (10 mg total) by mouth every Monday, Wednesday, and Friday. May take additional doses as needed for lower extremity edema. 12 tablet 0   No current facility-administered medications for this visit.   No Known Allergies  There were no vitals taken for this visit.  Wt Readings from Last 3 Encounters:  03/12/23 82.2 kg (181 lb 4.8 oz)  03/06/23 83 kg (183 lb)  12/16/22 86.2 kg (190 lb 1.6 oz)    PHYSICAL EXAM: General:  *** appearing.  No respiratory difficulty HEENT: normal Neck: supple. JVD *** cm. Carotids 2+ bilat; no bruits. No lymphadenopathy or thyromegaly appreciated. Cor: PMI nondisplaced. Regular rate & rhythm. No rubs, gallops or murmurs. Lungs: clear Abdomen: soft, nontender, nondistended. No hepatosplenomegaly. No bruits or masses. Good bowel sounds. Extremities: no cyanosis, clubbing, rash, edema  Neuro: alert & oriented x 3, cranial nerves grossly intact. moves all 4 extremities w/o difficulty. Affect pleasant.   ASSESSMENT & PLAN: 1. Chronic  Biventricular Systolic Heart Failure:  EF 15-20% with moderate to severe MR on echo 1/18.  Nonischemic cardiomyopathy. HTN vs cocaine use vs viral vs ETOH. HIV negative, SPEP with no M-spike.  Patient has been noted to have CAD but not enough to explain cardiomyopathy (Cath 2/23 admission with stable 80% PLV stenosis).  Echo in 8/18 showed EF up to 50%. However, he stopped his meds and started cocaine again, echo in 4/22 with EF back down to 20-25% => suspect cocaine abuse may be the primary etiology of cardiomyopathy. Echo in 2/23 with EF 20-25%, RV mildly reduced. RHC (3/23) with mildly elevated PCWP and LVEDP, CI 1.9.  He has stayed abstinent from cocaine for almost 6 months.  Echo (8/23) showed EF 25-30%, global hypokinesis, moderate LV dilation, mild LVH, mildly decreased RV systolic function. Stable NYHA II symptoms, he is not volume overloaded today. GDMT recently decreased due to worsening kidney function (likely over-diuresis). Echo 10/23 EF 20-25% RV moderately reduced. Echo 4/24 EF25-30%, normal RV. Echo 7/24 EF <20%, GIDD, RV mildly reduced - Losartan stopped with AKI. BP *** - Continue carvedilol 3.125 mg bid. - Continue torsemide 10 mg 3x/week - Restart digoxin 0.125 mg daily.  *** held with AKI - Continue Jardiance 10 mg daily. - Restart spiro 12.5 mg daily. *** stopped 2/2 hyperkalemia twice IP.  -  S/p CRT-D 12/23 2. Hx of VT/NSVT: Multiple runs during 3/23 admission, longest was 28 beats. Coronary angiography showed no change in coronaries.  - S/p CRT-D device. - Continue amiodarone 200 mg daily, recent LFTs and TSH normal. He should get regular eye exam.  - Continue mexilitine *** 3. CKD III: Baseline SCr 1.6. BMET today. 4. CAD: 80% stenosis PLV on cath in 2/23.  No chest pain. - Continue ASA. - Continue statin, check lipids today. 6. Polysubstance abuse abuse:  - UDS during last admission + cocaine + Benz+ THC    Follow up in 3-4 months with Dr. Shirlee Latch. Keep appt with Dr.  Graciela Husbands to discuss CRT-D. Continue paramedicine for now, they are helping tremendously.  Alen Bleacher. 03/17/2023

## 2023-03-19 NOTE — Telephone Encounter (Signed)
Returned call to patient and made him aware of the below    Rhodes, Anderson Malta, FNP     Stop Coreg until follow up   Patient aware and verbalized understanding. Advised patient to call back to office with any issues, questions, or concerns. Patient verbalized understanding.

## 2023-03-26 ENCOUNTER — Encounter (HOSPITAL_COMMUNITY): Payer: Self-pay

## 2023-03-26 ENCOUNTER — Ambulatory Visit (HOSPITAL_COMMUNITY)
Admit: 2023-03-26 | Discharge: 2023-03-26 | Disposition: A | Discharge status: Home / Self Care | Payer: Medicare HMO | Attending: Internal Medicine | Admitting: Internal Medicine

## 2023-03-26 VITALS — BP 108/72 | HR 80 | Wt 188.6 lb

## 2023-03-26 DIAGNOSIS — E1122 Type 2 diabetes mellitus with diabetic chronic kidney disease: Secondary | ICD-10-CM | POA: Insufficient documentation

## 2023-03-26 DIAGNOSIS — Z79899 Other long term (current) drug therapy: Secondary | ICD-10-CM | POA: Insufficient documentation

## 2023-03-26 DIAGNOSIS — I428 Other cardiomyopathies: Secondary | ICD-10-CM | POA: Insufficient documentation

## 2023-03-26 DIAGNOSIS — Z823 Family history of stroke: Secondary | ICD-10-CM | POA: Insufficient documentation

## 2023-03-26 DIAGNOSIS — N183 Chronic kidney disease, stage 3 unspecified: Secondary | ICD-10-CM | POA: Diagnosis not present

## 2023-03-26 DIAGNOSIS — I2510 Atherosclerotic heart disease of native coronary artery without angina pectoris: Secondary | ICD-10-CM

## 2023-03-26 DIAGNOSIS — I130 Hypertensive heart and chronic kidney disease with heart failure and stage 1 through stage 4 chronic kidney disease, or unspecified chronic kidney disease: Secondary | ICD-10-CM | POA: Insufficient documentation

## 2023-03-26 DIAGNOSIS — Z87891 Personal history of nicotine dependence: Secondary | ICD-10-CM | POA: Insufficient documentation

## 2023-03-26 DIAGNOSIS — I5022 Chronic systolic (congestive) heart failure: Secondary | ICD-10-CM

## 2023-03-26 DIAGNOSIS — I340 Nonrheumatic mitral (valve) insufficiency: Secondary | ICD-10-CM | POA: Insufficient documentation

## 2023-03-26 DIAGNOSIS — I5082 Biventricular heart failure: Secondary | ICD-10-CM | POA: Insufficient documentation

## 2023-03-26 DIAGNOSIS — I4729 Other ventricular tachycardia: Secondary | ICD-10-CM

## 2023-03-26 DIAGNOSIS — F1910 Other psychoactive substance abuse, uncomplicated: Secondary | ICD-10-CM | POA: Diagnosis not present

## 2023-03-26 DIAGNOSIS — E875 Hyperkalemia: Secondary | ICD-10-CM | POA: Diagnosis not present

## 2023-03-26 DIAGNOSIS — Z8249 Family history of ischemic heart disease and other diseases of the circulatory system: Secondary | ICD-10-CM | POA: Insufficient documentation

## 2023-03-26 LAB — BASIC METABOLIC PANEL
Anion gap: 9 (ref 5–15)
BUN: 17 mg/dL (ref 8–23)
CO2: 26 mmol/L (ref 22–32)
Calcium: 9 mg/dL (ref 8.9–10.3)
Chloride: 104 mmol/L (ref 98–111)
Creatinine, Ser: 1.5 mg/dL — ABNORMAL HIGH (ref 0.61–1.24)
GFR, Estimated: 50 mL/min — ABNORMAL LOW (ref >60)
Glucose, Bld: 125 mg/dL — ABNORMAL HIGH (ref 70–99)
Potassium: 3.9 mmol/L (ref 3.5–5.1)
Sodium: 139 mmol/L (ref 135–145)

## 2023-03-26 LAB — BRAIN NATRIURETIC PEPTIDE: B Natriuretic Peptide: 614.3 pg/mL — ABNORMAL HIGH (ref 0.0–100.0)

## 2023-03-26 MED ORDER — TORSEMIDE 20 MG PO TABS: 20.0000 mg | ORAL_TABLET | Freq: Every day | ORAL | 3 refills | Status: DC

## 2023-03-26 NOTE — Patient Instructions (Signed)
Thank you for coming in today  If you had labs drawn today, any labs that are abnormal the clinic will call you No news is good news  Medications: STOP Jardiance RESTART Torsemide 20 mg daily  Follow up appointments:  Your physician recommends that you schedule a follow-up appointment in:  4 weeks with Pharmacy 8 weeks in clinic 4 months With Dr. Shirlee Latch    Do the following things EVERYDAY: Weigh yourself in the morning before breakfast. Write it down and keep it in a log. Take your medicines as prescribed Eat low salt foods--Limit salt (sodium) to 2000 mg per day.  Stay as active as you can everyday Limit all fluids for the day to less than 2 liters   At the Advanced [facility] Failure Clinic, you and your health needs are our priority. As part of our continuing mission to provide you with exceptional heart care, we have created designated Provider Care Teams. These Care Teams include your primary Cardiologist (physician) and Advanced Practice Providers (APPs- Physician Assistants and Nurse Practitioners) who all work together to provide you with the care you need, when you need it.   You may see any of the following providers on your designated Care Team at your next follow up: Dr Arvilla Meres Dr Marca Ancona Dr. Marcos Eke, NP Robbie Lis, [place] [facility] [place], [place] Brynda Peon, NP Karle Plumber, PharmD   Please be sure to bring in all your medications bottles to every appointment.    Thank you for choosing [facility]  If you have any questions or concerns before your next appointment please send [place] a message through [place] or call our office at [phone].    TO LEAVE A MESSAGE FOR THE NURSE SELECT OPTION 2, PLEASE LEAVE A MESSAGE INCLUDING: YOUR NAME DATE OF BIRTH CALL BACK NUMBER REASON FOR CALL**this is important as we prioritize the call backs  YOU WILL RECEIVE A CALL BACK THE SAME  DAY AS LONG AS YOU CALL BEFORE 4:00 PM

## 2023-04-02 ENCOUNTER — Telehealth (HOSPITAL_COMMUNITY): Payer: Self-pay

## 2023-04-02 DIAGNOSIS — I5022 Chronic systolic (congestive) heart failure: Secondary | ICD-10-CM

## 2023-04-02 NOTE — Telephone Encounter (Signed)
-----   Message from Alen Bleacher sent at 03/26/2023 12:01 PM EDT ----- Renal function elevated. Hold Torsemide for 3 days. Repeat BMET in 7-10 days.

## 2023-04-02 NOTE — Telephone Encounter (Signed)
Patient's labs has been ordered and his appointment scheduled. In addition, pt will hold his torsemide for 3 days. Pt aware, agreeable, and verbalized understanding.

## 2023-04-08 ENCOUNTER — Ambulatory Visit (HOSPITAL_COMMUNITY)
Admission: RE | Admit: 2023-04-08 | Discharge: 2023-04-08 | Disposition: A | Discharge status: Home / Self Care | Payer: Medicare HMO | Source: Ambulatory Visit | Attending: Internal Medicine | Admitting: Internal Medicine

## 2023-04-08 DIAGNOSIS — I5022 Chronic systolic (congestive) heart failure: Secondary | ICD-10-CM | POA: Diagnosis present

## 2023-04-08 LAB — BASIC METABOLIC PANEL
Anion gap: 9 (ref 5–15)
BUN: 17 mg/dL (ref 8–23)
CO2: 23 mmol/L (ref 22–32)
Calcium: 9.1 mg/dL (ref 8.9–10.3)
Chloride: 104 mmol/L (ref 98–111)
Creatinine, Ser: 1.37 mg/dL — ABNORMAL HIGH (ref 0.61–1.24)
GFR, Estimated: 56 mL/min — ABNORMAL LOW (ref >60)
Glucose, Bld: 89 mg/dL (ref 70–99)
Potassium: 4.2 mmol/L (ref 3.5–5.1)
Sodium: 136 mmol/L (ref 135–145)

## 2023-04-15 ENCOUNTER — Other Ambulatory Visit: Payer: Self-pay

## 2023-04-15 ENCOUNTER — Encounter (HOSPITAL_COMMUNITY): Payer: Self-pay

## 2023-04-15 ENCOUNTER — Inpatient Hospital Stay (HOSPITAL_COMMUNITY)
Admission: EM | Admit: 2023-04-15 | Discharge: 2023-04-17 | DRG: 193 | Disposition: A | Discharge status: Home Health | Payer: Medicare HMO | Attending: Family Medicine | Admitting: Family Medicine

## 2023-04-15 ENCOUNTER — Emergency Department (HOSPITAL_COMMUNITY): Payer: Medicare HMO

## 2023-04-15 DIAGNOSIS — R7989 Other specified abnormal findings of blood chemistry: Secondary | ICD-10-CM | POA: Diagnosis not present

## 2023-04-15 DIAGNOSIS — I472 Ventricular tachycardia, unspecified: Secondary | ICD-10-CM | POA: Diagnosis present

## 2023-04-15 DIAGNOSIS — Z95810 Presence of automatic (implantable) cardiac defibrillator: Secondary | ICD-10-CM | POA: Diagnosis not present

## 2023-04-15 DIAGNOSIS — E785 Hyperlipidemia, unspecified: Secondary | ICD-10-CM | POA: Diagnosis present

## 2023-04-15 DIAGNOSIS — Z7951 Long term (current) use of inhaled steroids: Secondary | ICD-10-CM | POA: Diagnosis not present

## 2023-04-15 DIAGNOSIS — N400 Enlarged prostate without lower urinary tract symptoms: Secondary | ICD-10-CM | POA: Diagnosis present

## 2023-04-15 DIAGNOSIS — J189 Pneumonia, unspecified organism: Secondary | ICD-10-CM | POA: Diagnosis present

## 2023-04-15 DIAGNOSIS — F17210 Nicotine dependence, cigarettes, uncomplicated: Secondary | ICD-10-CM | POA: Diagnosis present

## 2023-04-15 DIAGNOSIS — I5022 Chronic systolic (congestive) heart failure: Secondary | ICD-10-CM | POA: Diagnosis present

## 2023-04-15 DIAGNOSIS — J440 Chronic obstructive pulmonary disease with acute lower respiratory infection: Secondary | ICD-10-CM | POA: Diagnosis present

## 2023-04-15 DIAGNOSIS — I471 Supraventricular tachycardia, unspecified: Secondary | ICD-10-CM | POA: Diagnosis present

## 2023-04-15 DIAGNOSIS — J449 Chronic obstructive pulmonary disease, unspecified: Secondary | ICD-10-CM | POA: Diagnosis present

## 2023-04-15 DIAGNOSIS — J9601 Acute respiratory failure with hypoxia: Secondary | ICD-10-CM | POA: Diagnosis present

## 2023-04-15 DIAGNOSIS — R0602 Shortness of breath: Secondary | ICD-10-CM | POA: Diagnosis present

## 2023-04-15 DIAGNOSIS — Z1152 Encounter for screening for COVID-19: Secondary | ICD-10-CM

## 2023-04-15 DIAGNOSIS — J441 Chronic obstructive pulmonary disease with (acute) exacerbation: Secondary | ICD-10-CM | POA: Diagnosis present

## 2023-04-15 DIAGNOSIS — E119 Type 2 diabetes mellitus without complications: Secondary | ICD-10-CM | POA: Diagnosis present

## 2023-04-15 DIAGNOSIS — N1832 Chronic kidney disease, stage 3b: Secondary | ICD-10-CM | POA: Diagnosis present

## 2023-04-15 DIAGNOSIS — Z823 Family history of stroke: Secondary | ICD-10-CM

## 2023-04-15 DIAGNOSIS — I130 Hypertensive heart and chronic kidney disease with heart failure and stage 1 through stage 4 chronic kidney disease, or unspecified chronic kidney disease: Secondary | ICD-10-CM | POA: Diagnosis present

## 2023-04-15 DIAGNOSIS — Z7984 Long term (current) use of oral hypoglycemic drugs: Secondary | ICD-10-CM | POA: Diagnosis not present

## 2023-04-15 DIAGNOSIS — F419 Anxiety disorder, unspecified: Secondary | ICD-10-CM | POA: Diagnosis present

## 2023-04-15 DIAGNOSIS — E1122 Type 2 diabetes mellitus with diabetic chronic kidney disease: Secondary | ICD-10-CM | POA: Diagnosis present

## 2023-04-15 DIAGNOSIS — I428 Other cardiomyopathies: Secondary | ICD-10-CM | POA: Diagnosis not present

## 2023-04-15 DIAGNOSIS — I2510 Atherosclerotic heart disease of native coronary artery without angina pectoris: Secondary | ICD-10-CM | POA: Diagnosis present

## 2023-04-15 DIAGNOSIS — I447 Left bundle-branch block, unspecified: Secondary | ICD-10-CM | POA: Diagnosis present

## 2023-04-15 DIAGNOSIS — Z79899 Other long term (current) drug therapy: Secondary | ICD-10-CM

## 2023-04-15 DIAGNOSIS — Z720 Tobacco use: Secondary | ICD-10-CM | POA: Diagnosis present

## 2023-04-15 DIAGNOSIS — Z8674 Personal history of sudden cardiac arrest: Secondary | ICD-10-CM | POA: Diagnosis not present

## 2023-04-15 DIAGNOSIS — Z8249 Family history of ischemic heart disease and other diseases of the circulatory system: Secondary | ICD-10-CM | POA: Diagnosis not present

## 2023-04-15 DIAGNOSIS — F1910 Other psychoactive substance abuse, uncomplicated: Secondary | ICD-10-CM

## 2023-04-15 DIAGNOSIS — I5A Non-ischemic myocardial injury (non-traumatic): Secondary | ICD-10-CM | POA: Diagnosis present

## 2023-04-15 DIAGNOSIS — I420 Dilated cardiomyopathy: Secondary | ICD-10-CM | POA: Diagnosis present

## 2023-04-15 DIAGNOSIS — Z7982 Long term (current) use of aspirin: Secondary | ICD-10-CM | POA: Diagnosis not present

## 2023-04-15 DIAGNOSIS — Z5982 Transportation insecurity: Secondary | ICD-10-CM

## 2023-04-15 LAB — CBC WITH DIFFERENTIAL/PLATELET
Abs Immature Granulocytes: 0.03 10*3/uL (ref 0.00–0.07)
Basophils Absolute: 0.1 10*3/uL (ref 0.0–0.1)
Basophils Relative: 1 %
Eosinophils Absolute: 0.3 10*3/uL (ref 0.0–0.5)
Eosinophils Relative: 3 %
HCT: 47.5 % (ref 39.0–52.0)
Hemoglobin: 14.9 g/dL (ref 13.0–17.0)
Immature Granulocytes: 0 %
Lymphocytes Relative: 13 %
Lymphs Abs: 1 10*3/uL (ref 0.7–4.0)
MCH: 26.7 pg (ref 26.0–34.0)
MCHC: 31.4 g/dL (ref 30.0–36.0)
MCV: 85.1 fL (ref 80.0–100.0)
Monocytes Absolute: 1.1 10*3/uL — ABNORMAL HIGH (ref 0.1–1.0)
Monocytes Relative: 14 %
Neutro Abs: 5.5 10*3/uL (ref 1.7–7.7)
Neutrophils Relative %: 69 %
Platelets: 165 10*3/uL (ref 150–400)
RBC: 5.58 MIL/uL (ref 4.22–5.81)
RDW: 17.6 % — ABNORMAL HIGH (ref 11.5–15.5)
WBC: 8 10*3/uL (ref 4.0–10.5)
nRBC: 0 % (ref 0.0–0.2)

## 2023-04-15 LAB — TROPONIN I (HIGH SENSITIVITY)
Troponin I (High Sensitivity): 24 ng/L — ABNORMAL HIGH (ref <18)
Troponin I (High Sensitivity): 30 ng/L — ABNORMAL HIGH (ref <18)
Troponin I (High Sensitivity): 42 ng/L — ABNORMAL HIGH (ref <18)

## 2023-04-15 LAB — COMPREHENSIVE METABOLIC PANEL
ALT: 25 U/L (ref 0–44)
AST: 38 U/L (ref 15–41)
Albumin: 3.4 g/dL — ABNORMAL LOW (ref 3.5–5.0)
Alkaline Phosphatase: 160 U/L — ABNORMAL HIGH (ref 38–126)
Anion gap: 14 (ref 5–15)
BUN: 20 mg/dL (ref 8–23)
CO2: 18 mmol/L — ABNORMAL LOW (ref 22–32)
Calcium: 9.3 mg/dL (ref 8.9–10.3)
Chloride: 105 mmol/L (ref 98–111)
Creatinine, Ser: 1.77 mg/dL — ABNORMAL HIGH (ref 0.61–1.24)
GFR, Estimated: 41 mL/min — ABNORMAL LOW (ref >60)
Glucose, Bld: 155 mg/dL — ABNORMAL HIGH (ref 70–99)
Potassium: 4.9 mmol/L (ref 3.5–5.1)
Sodium: 137 mmol/L (ref 135–145)
Total Bilirubin: 1 mg/dL (ref 0.3–1.2)
Total Protein: 8.4 g/dL — ABNORMAL HIGH (ref 6.5–8.1)

## 2023-04-15 LAB — I-STAT VENOUS BLOOD GAS, ED
Acid-base deficit: 3 mmol/L — ABNORMAL HIGH (ref 0.0–2.0)
Bicarbonate: 20 mmol/L (ref 20.0–28.0)
Calcium, Ion: 1.05 mmol/L — ABNORMAL LOW (ref 1.15–1.40)
HCT: 49 % (ref 39.0–52.0)
Hemoglobin: 16.7 g/dL (ref 13.0–17.0)
O2 Saturation: 89 %
Potassium: 4.6 mmol/L (ref 3.5–5.1)
Sodium: 138 mmol/L (ref 135–145)
TCO2: 21 mmol/L — ABNORMAL LOW (ref 22–32)
pCO2, Ven: 31.8 mmHg — ABNORMAL LOW (ref 44–60)
pH, Ven: 7.407 (ref 7.25–7.43)
pO2, Ven: 56 mmHg — ABNORMAL HIGH (ref 32–45)

## 2023-04-15 LAB — RESP PANEL BY RT-PCR (RSV, FLU A&B, COVID)  RVPGX2
Influenza A by PCR: NEGATIVE
Influenza B by PCR: NEGATIVE
Resp Syncytial Virus by PCR: NEGATIVE
SARS Coronavirus 2 by RT PCR: NEGATIVE

## 2023-04-15 LAB — BRAIN NATRIURETIC PEPTIDE: B Natriuretic Peptide: 66 pg/mL (ref 0.0–100.0)

## 2023-04-15 MED ORDER — SODIUM CHLORIDE 0.9 % IV SOLN: 2.0000 g | INTRAVENOUS | Status: DC

## 2023-04-15 MED ORDER — TORSEMIDE 20 MG PO TABS
20.0000 mg | ORAL_TABLET | Freq: Every day | ORAL | Status: DC
  Administered 2023-04-16 – 2023-04-17 (×2): 20 mg via ORAL
  Filled 2023-04-15 (×2): qty 1

## 2023-04-15 MED ORDER — SODIUM CHLORIDE 0.9 % IV SOLN
1.0000 g | Freq: Once | INTRAVENOUS | Status: AC
  Administered 2023-04-15: 1 g via INTRAVENOUS
  Filled 2023-04-15: qty 10

## 2023-04-15 MED ORDER — SODIUM CHLORIDE 0.9 % IV SOLN
100.0000 mg | Freq: Once | INTRAVENOUS | Status: AC
  Administered 2023-04-15: 100 mg via INTRAVENOUS
  Filled 2023-04-15: qty 100

## 2023-04-15 MED ORDER — IPRATROPIUM-ALBUTEROL 0.5-2.5 (3) MG/3ML IN SOLN
3.0000 mL | Freq: Once | RESPIRATORY_TRACT | Status: AC
  Administered 2023-04-15: 3 mL via RESPIRATORY_TRACT

## 2023-04-15 MED ORDER — PREDNISONE 20 MG PO TABS
40.0000 mg | ORAL_TABLET | Freq: Every day | ORAL | Status: DC
  Administered 2023-04-16 – 2023-04-17 (×2): 40 mg via ORAL
  Filled 2023-04-15 (×2): qty 2

## 2023-04-15 MED ORDER — ASPIRIN 81 MG PO TBEC
81.0000 mg | DELAYED_RELEASE_TABLET | Freq: Every day | ORAL | Status: DC
  Administered 2023-04-16 – 2023-04-17 (×2): 81 mg via ORAL
  Filled 2023-04-15 (×2): qty 1

## 2023-04-15 MED ORDER — SODIUM CHLORIDE 0.9 % IV SOLN: 500.0000 mg | INTRAVENOUS | Status: DC

## 2023-04-15 MED ORDER — SODIUM CHLORIDE 0.9 % IV SOLN
1.0000 g | INTRAVENOUS | Status: DC
  Administered 2023-04-16 – 2023-04-17 (×2): 1 g via INTRAVENOUS
  Filled 2023-04-15 (×2): qty 10

## 2023-04-15 MED ORDER — GABAPENTIN 100 MG PO CAPS
200.0000 mg | ORAL_CAPSULE | Freq: Two times a day (BID) | ORAL | Status: DC
  Administered 2023-04-15 – 2023-04-17 (×4): 200 mg via ORAL
  Filled 2023-04-15 (×4): qty 2

## 2023-04-15 MED ORDER — MEXILETINE HCL 150 MG PO CAPS
150.0000 mg | ORAL_CAPSULE | Freq: Two times a day (BID) | ORAL | Status: DC
  Administered 2023-04-15 – 2023-04-17 (×4): 150 mg via ORAL
  Filled 2023-04-15 (×5): qty 1

## 2023-04-15 MED ORDER — CARVEDILOL 3.125 MG PO TABS
3.1250 mg | ORAL_TABLET | Freq: Two times a day (BID) | ORAL | Status: DC
  Administered 2023-04-15 – 2023-04-17 (×4): 3.125 mg via ORAL
  Filled 2023-04-15 (×4): qty 1

## 2023-04-15 MED ORDER — NICOTINE 7 MG/24HR TD PT24: 7.0000 mg | MEDICATED_PATCH | Freq: Every day | TRANSDERMAL | Status: DC | PRN

## 2023-04-15 MED ORDER — FLUTICASONE FUROATE-VILANTEROL 100-25 MCG/ACT IN AEPB
1.0000 | INHALATION_SPRAY | Freq: Every day | RESPIRATORY_TRACT | Status: DC
  Administered 2023-04-17: 1 via RESPIRATORY_TRACT
  Filled 2023-04-15 (×2): qty 28

## 2023-04-15 MED ORDER — SODIUM CHLORIDE 0.9 % IV SOLN
100.0000 mg | Freq: Two times a day (BID) | INTRAVENOUS | Status: DC
  Administered 2023-04-16 – 2023-04-17 (×3): 100 mg via INTRAVENOUS
  Filled 2023-04-15 (×4): qty 100

## 2023-04-15 MED ORDER — ROSUVASTATIN CALCIUM 5 MG PO TABS
5.0000 mg | ORAL_TABLET | Freq: Every day | ORAL | Status: DC
  Administered 2023-04-16 – 2023-04-17 (×2): 5 mg via ORAL
  Filled 2023-04-15 (×2): qty 1

## 2023-04-15 MED ORDER — IPRATROPIUM-ALBUTEROL 0.5-2.5 (3) MG/3ML IN SOLN: 3.0000 mL | RESPIRATORY_TRACT | Status: DC | PRN

## 2023-04-15 MED ORDER — ENOXAPARIN SODIUM 40 MG/0.4ML IJ SOSY
40.0000 mg | PREFILLED_SYRINGE | INTRAMUSCULAR | Status: DC
  Administered 2023-04-16 – 2023-04-17 (×2): 40 mg via SUBCUTANEOUS
  Filled 2023-04-15 (×2): qty 0.4

## 2023-04-15 MED ORDER — IPRATROPIUM-ALBUTEROL 0.5-2.5 (3) MG/3ML IN SOLN
RESPIRATORY_TRACT | Status: AC
  Filled 2023-04-15: qty 3

## 2023-04-15 MED ORDER — AMIODARONE HCL 200 MG PO TABS
200.0000 mg | ORAL_TABLET | Freq: Every day | ORAL | Status: DC
  Administered 2023-04-16 – 2023-04-17 (×2): 200 mg via ORAL
  Filled 2023-04-15 (×2): qty 1

## 2023-04-15 NOTE — H&P (Addendum)
Hospital Admission History and Physical Service Pager: [phone]  Patient name: Edward Mullen Medical record number: [id number] Date of Birth: 05-Jan-1956 Age: 67 y.o. Gender: male  Primary Care Provider: Loura Back, NP Consultants: Consider heart failure team  Code Status: FULL --confirmed by patient Preferred Emergency Contact:  Contact Information     Name Relation Home Work [place] Spouse [phone]  [phone]      Other Contacts     Name Relation Home Work Mobile   [place] Daughter   [phone]        Chief Complaint: Dyspnea   Assessment and Plan: Edward Mullen is a 67 y.o. male presenting with dyspnea. Differential for presentation of this includes pneumonia is the most likely cause given presentation, physical examination and imaging results, systolic heart failure although less likely, COPD exacerbation which may be contributing to his current symptoms, anxiety, PE, pneumothorax, other cardiac etiology.   Assessment & Plan Pneumonia of both lungs due to infectious organism, unspecified part of lung Chest x-ray showed evidence of multifocal pneumonia.  Will treat for this with CAP coverage (long QT avoid Azithro).  Will additionally treat for concern of COPD exacerbation with DuoNebs and Breo. -Admit to FMTS med/tele, Dr. Manson Passey as attending - Pulmonary toilet, incentive spirometry - Ceftriaxone and doxycycline  - MRSA swab - Status post ceftriaxone and Doxy x 1 dose in ED  - AM CBC, BMP  - DuoNebs - Breo - Consider oral steroids if indicated -- Wean O2 as tolerated 88%-92% Chronic obstructive pulmonary disease, unspecified COPD type (HCC) Possible GOLD C-D at baseline, no FEV1 in chart. Breo, Duonebs, and steroids for concern of exacerbation.  Polysubstance abuse (HCC) Encourage cessation, last used cocaine 2 weeks ago. Tobacco abuse Smokes 1 to 2 cigarettes every 1 week, discuss nicotine patch.  Type 2 diabetes  mellitus with stage 3b chronic kidney disease, without long-term current use of insulin (HCC) CBG q4 checks. Last A1C 5.5 1 mo ago    Chronic and Stable Problems:  History of NSVT/SVT: On amiodarone 200 mg daily, mexiletine 150 mg twice daily Systolic heart failure with EF of <20%: On GDMT with heart failure clinic, torsemide 20 mg, Coreg 3.125 mg twice daily, held [place] and [place] on last visit, unsure if taking Entresto?  HLD: Crestor     FEN/GI: Cardiac Diet  VTE Prophylaxis: Lovenox   Disposition: Cardiac Tele   History of Present Illness:  Edward Mullen is a 67 y.o. male with past medical history of chronic systolic heart failure, nonsustained ventricular tachycardia, stage IIIa CKD, CAD, polysubstance use disorder presenting with shortness of breath. Cough worsening over three weeks, coughing up yellow sputum, more sputum and change in color from baseline. Today, dyspnea worsening, going through normal day and he was so short of breath, EMS was contacted. Notes that he smokes weed and cigarettes.   Pt was hypoxic in the 50s with EMS, who then placed him on CPAP. He transitioned to BIPAP and then eventually to 3L [place] after duoneb in the ED.   Had shingles in early 2024.   Cardiac History obtained from chart review: Patient follows with heart care at advanced heart failure clinic.  Initially, admitted in January 2018 for acute systolic heart failure in the setting of substance use, namely cocaine.  His echo at that time showed EF of 15 to 20%.  He received IV Lasix and GDMT as able.  Additionally, admitted for syncope in 12/2016 after using cocaine  and was noted to have nonspontaneous ventricular tachycardia on telemetry.  He did have a LifeVest given at that time.  Ejection fraction then improved to 50% and LifeVest was removed.  Patient then was noncompliant with his GDMT and started using cocaine again.  Admitted 11/2020 with frequent nonsustained ventricular tachycardia noted  and amiodarone was started for the patient.  At that time, his echocardiogram showed an EF of 20 to 25% within normal right ventricle.  Left heart cath then showed 80% PLV stenosis and was managed medically.  Then he was lost to follow-up with the heart failure clinic.   Admitted on 10/2021 with acute heart failure pneumonia echo showed an EF of 20 to 25% with the right ventricle that was mildly reduced in function.  Started on milrinone and IV Lasix.  Right heart cath at that time showed mildly elevated PCWP and LVEDP.  Milrinone was weaned and GDMT was titrated once again.  Had NSVT/VT and was discharged home on LifeVest again.  In April 2024, he was found unresponsive in his car and required 8 minutes of CPR with suspected cocaine overdose.  He developed cardiogenic shock postcardiac arrest and required intubation and pressors in the CCU.  Pressors were ultimately weaned off and he was discharged home on carvedilol, mexiletine, amiodarone and torsemide.  Most recently, he was admitted to the hospital on 7/24 with lightheadedness, hypotension, syncope and had GDMT held initially but restarted at discharge.  It was felt that he had the symptoms from dehydration/hypovolemia and was also found to have epididymo-orchitis.  Finally, presented to the AHF clinic on 03/26/2023 for hospital follow-up.  At that time, was started on torsemide 20 mg, carvedilol 3.125 mg twice daily, Entresto 24-26 mg twice daily, held [place].  Has BiVent ICD  For history of VT/NSVT: Continued amiodarone 200 mg daily and mexiletine 150 mg twice daily.   Most recent echo: 03/11/23 LVEF less than 20% with severely decreased function and global hypokinesis grade 1 diastolic dysfunction, normal right ventricular size with mildly reduced function.  Left atrium mildly dilated as well as right atrium.  Review Of Systems: Per HPI with the following additions: Fever, chills, chest pain.   Pertinent Past Medical  History: CHF  COPD  HTN Neuropathy  NSVT/SVT  Remainder reviewed in history tab.   Pertinent Past Surgical History: Cardiac cath  Lumbar fusion    Remainder reviewed in history tab.  Pertinent Social History: Tobacco use: Yes--smokes 1 cigarette twice weekly, smoking for twelve years  Alcohol use: None  Other Substance use: Marijuana, Cocaine 2 weeks ago  Lives with daughter   Pertinent Family History: Stroke in mom  Heart disease in father   Remainder reviewed in history tab.   Important Outpatient Medications: Amiodarone 200 mg daily  ASA 81 mg  Coreg 3.125 mg  Torsemide 20 mg Crestor 5 mg Mexiletine 150 mg twice daily Gabapentin 200 mg twice daily Breo Remainder reviewed in medication history.   Objective: BP 100/72   Pulse 79   Temp (!) 96.4 F (35.8 C) (Axillary)   Resp (!) 27   Ht 6' (1.829 m)   Wt 83.9 kg   SpO2 92%   BMI 25.09 kg/m  Exam: General: NAD, nontoxic, frail, pleasant African-American male Eyes: EOMI ENTM: Normal nares and external ears, poor dentition Neck: No lymphadenopathy, no JVD noted Cardiovascular: Regular rate and rhythm, no murmurs gallops or rubs Respiratory: Diffusely coarse with bibasilar crackles Gastrointestinal: Nontender to palpation, soft, nondistended MSK: Able to  move all extremities Derm: Healing slightly erythematous skin over dermatome mid back, no drainage, no swelling, no pain to palpation Neuro: Alert and oriented x 4 Psych: Normal affect and mood  Labs:  CBC BMET  Recent Labs  Lab 04/15/23 1437 04/15/23 1447  WBC 8.0  --   HGB 14.9 16.7  HCT 47.5 49.0  PLT 165  --    Recent Labs  Lab 04/15/23 1437 04/15/23 1447  NA 137 138  K 4.9 4.6  CL 105  --   CO2 18*  --   BUN 20  --   CREATININE 1.77*  --   GLUCOSE 155*  --   CALCIUM 9.3  --      EKG: ICD, long QT with wide QRS.    Imaging Studies Performed:  Chest XR IMPRESSION: Multilobar pneumonia.  Follow-up to clearing is  recommended.   Alfredo Martinez MD  04/15/2023, 6:52 PM PGY-3, [facility] Health Family Medicine  FPTS Intern pager: [phone], text pages welcome Secure chat group [facility] Teaching Service

## 2023-04-15 NOTE — Assessment & Plan Note (Deleted)
Chest x-ray showed evidence of multifocal pneumonia.  Will treat for this with CAP coverage (long QT avoid Azithro).  Will additionally treat for concern of COPD exacerbation with DuoNebs and Breo. -Admit to FMTS med/tele, Dr. Manson Passey as attending - Pulmonary toilet, incentive spirometry - Ceftriaxone and doxycycline  - MRSA swab - Status post ceftriaxone and Doxy x 1 dose in ED  - AM CBC, BMP  - DuoNebs - Breo - Consider oral steroids if indicated -- Wean O2 as tolerated 88%-92%

## 2023-04-15 NOTE — Progress Notes (Addendum)
FMTS Interim Progress Note  S: Went to bedside to see patient with Dr. Yetta Barre.  Patient reports that he is doing well and much better than when he was first to the hospital.  Denies any shortness of breath or chest pain at this time.  O: BP 107/71   Pulse 63   Temp (!) 96.4 F (35.8 C) (Axillary)   Resp 20   Ht 6' (1.829 m)   Wt 83.9 kg   SpO2 99%   BMI 25.09 kg/m   General: awake and alert, NAD on 3L O2 Cardiovascular: distant heart sounds, warm and well perfused Respiratory: course breath sounds R>L on 3L [name]  A/P: Multilobar PNA Patient is doing better than when he was first admitted. Remains on 3L O2 and saturating well.  Able to converse with no distress. - Continue ceftriaxone and doxycycline (9/3- ) - Wean O2 as tolerated, maintain oxygenation between 88 - 92% with history of COPD  Up-trending Trops/EKG Changes Serial EKG changes including wide QRS complex and prolonged QTc.  Troponins gradually rising from 24>30>42.  Changes are likely due to multilobar pneumonia and mild cardial injury in the setting of infection. Reassuringly patient reports no chest pain, palpitations, or shortness of breath.  He still remains on 3 L O2 which is a new requirement for him due to the pneumonia and history of COPD.  Due to patient's extensive cardiac history discussed the EKG changes and troponins with cardiac fellow, Dr. Lendell Caprice to get recommendations.  - Dr. Lendell Caprice to follow-up with recommendations after reviewing the chart further or seeing the patient  Rest of plan per day team note.  Fortunato Curling, DO 04/16/2023, 12:53 AM PGY-1, [facility] Family Medicine Service pager [phone]

## 2023-04-15 NOTE — Assessment & Plan Note (Addendum)
Chest x-ray showed evidence of multifocal pneumonia.  Will treat for this with CAP coverage (long QT avoid Azithro).  Will additionally treat for concern of COPD exacerbation with DuoNebs and Breo. -Admit to FMTS med/tele, Dr. Manson Passey as attending - Pulmonary toilet, incentive spirometry - Ceftriaxone and doxycycline  - MRSA swab - Status post ceftriaxone and Doxy x 1 dose in ED  - AM CBC, BMP  - DuoNebs - Breo - Consider oral steroids if indicated -- Wean O2 as tolerated 88%-92%

## 2023-04-15 NOTE — Assessment & Plan Note (Deleted)
Possible GOLD C-D at baseline, no FEV1 in chart. Breo, Duonebs, and steroids for concern of exacerbation.

## 2023-04-15 NOTE — Assessment & Plan Note (Deleted)
Encourage cessation, last used cocaine 2 weeks ago.

## 2023-04-15 NOTE — ED Provider Notes (Signed)
[place] EMERGENCY DEPARTMENT AT [facility] Provider Note   CSN: [id number] Arrival date & time: 04/15/23  1422     History  Chief Complaint  Patient presents with   Respiratory Distress    Edward Mullen is a 67 y.o. male.  HPI   67 year old male with past medical history of CHF/COPD presents the emergency department by EMS on CPAP.  Patient reportedly coming from her friend's house.  Was found to be diaphoretic and short of breath.  When EMS arrived he was hypoxic into the 50s, not improved on a nonrebreather she was transitioned to CPAP.  History is limited secondary to transitioning to BiPAP and acuity of respiratory distress however patient denies any acute chest pain, productive cough, fever.  Does have pacemaker in place.  Home Medications Prior to Admission medications   Medication Sig Start Date End Date Taking? Authorizing Provider  acetaminophen (TYLENOL) 500 MG tablet Take 2 tablets (1,000 mg total) by mouth every 8 (eight) hours as needed. 12/16/22   Rocky Morel, DO  albuterol (PROVENTIL) (2.5 MG/3ML) 0.083% nebulizer solution USE ONE VIAL (2.5 MG TOTAL) BY NEBULIZATION EVERY 6 (SIX) HOURS AS NEEDED FOR SHORTNESS OF BREATH OR WHEEZING. 10/29/22   Morene Crocker, MD  albuterol (VENTOLIN HFA) 108 (90 Base) MCG/ACT inhaler INHALE 1 TO 2 PUFFS BY MOUTH EVERY 6 HOURS AS NEEDED FOR WHEEZING OR SHORTNESS OF BREATH 10/29/22   Morene Crocker, MD  amiodarone (PACERONE) 200 MG tablet Take 1 tablet (200 mg total) by mouth daily. 11/26/22   Zannie Cove, MD  ASPIRIN LOW DOSE 81 MG tablet TAKE 1 TABLET (81 MG TOTAL) BY MOUTH DAILY. SWALLOW WHOLE(AM) Patient taking differently: Take 81 mg by mouth daily. 01/27/23   [place], Anderson Malta, FNP  BREO ELLIPTA 100-25 MCG/ACT AEPB Inhale 1 puff into the lungs daily. 08/02/22 08/02/23  Morene Crocker, MD  carvedilol (COREG) 3.125 MG tablet Take 1 tablet (3.125 mg total) by mouth 2 (two) times daily. 01/29/22  03/25/24  Jacklynn Ganong, FNP  Cholecalciferol (VITAMIN D3) 50 MCG (2000 UT) TABS Take 2,000 Units by mouth daily.    [provider]  gabapentin (NEURONTIN) 100 MG capsule Take 2 capsules (200 mg total) by mouth 3 (three) times daily. Patient taking differently: Take 200 mg by mouth 2 (two) times daily. 12/16/22 03/25/24  Rocky Morel, DO  mexiletine (MEXITIL) 150 MG capsule Take 1 capsule (150 mg total) by mouth 2 (two) times daily. 07/31/22   Duke Salvia, MD  rosuvastatin (CRESTOR) 5 MG tablet TAKE 1 TABLET (5 MG TOTAL) BY MOUTH DAILY. Patient taking differently: Take 5 mg by mouth daily. 12/26/20   Laurey Morale, MD  tamsulosin (FLOMAX) 0.4 MG CAPS capsule TAKE ONE CAPSULE BY MOUTH ONCE DAILY AFTER SUPPER Patient not taking: Reported on 03/26/2023 02/20/21   Belva Agee, MD  torsemide (DEMADEX) 20 MG tablet Take 1 tablet (20 mg total) by mouth daily. 03/26/23 06/24/23  Alen Bleacher, NP  zolpidem (AMBIEN) 10 MG tablet Take 1 tablet (10 mg total) by mouth at bedtime as needed for sleep. 09/16/22 01/03/23        Allergies    Patient has no known allergies.    Review of Systems   Review of Systems  Constitutional:  Positive for fatigue. Negative for fever.  Respiratory:  Positive for shortness of breath. Negative for cough.   Cardiovascular:  Positive for leg swelling. Negative for chest pain.  Gastrointestinal:  Negative for abdominal pain, diarrhea and  vomiting.  Skin:  Negative for rash.  Neurological:  Negative for headaches.    Physical Exam Updated Vital Signs BP 93/69 (BP Location: Right Arm)   Pulse (!) 106   Temp (!) 96.4 F (35.8 C) (Axillary)   Resp 20   Ht 6' (1.829 m)   Wt 83.9 kg   SpO2 94%   BMI 25.09 kg/m  Physical Exam Vitals and nursing note reviewed.  Constitutional:      General: He is in acute distress.     Appearance: Normal appearance.  HENT:     Head: Normocephalic.     Mouth/Throat:     Mouth: Mucous membranes are moist.   Cardiovascular:     Rate and Rhythm: Normal rate.  Pulmonary:     Breath sounds: Wheezing and rales present.     Comments: Tachypneic, transition to BiPAP Abdominal:     Palpations: Abdomen is soft.     Tenderness: There is no abdominal tenderness.  Musculoskeletal:        General: Swelling present.  Skin:    General: Skin is warm.  Neurological:     Mental Status: He is alert and oriented to person, place, and time.  Psychiatric:        Mood and Affect: Mood normal.     ED Results / Procedures / Treatments   Labs (all labs ordered are listed, but only abnormal results are displayed) Labs Reviewed  CBC WITH DIFFERENTIAL/PLATELET - Abnormal; Notable for the following components:      Result Value   RDW 17.6 (*)    Monocytes Absolute 1.1 (*)    All other components within normal limits  I-STAT VENOUS BLOOD GAS, ED - Abnormal; Notable for the following components:   pCO2, Ven 31.8 (*)    pO2, Ven 56 (*)    TCO2 21 (*)    Acid-base deficit 3.0 (*)    Calcium, Ion 1.05 (*)    All other components within normal limits  RESP PANEL BY RT-PCR (RSV, FLU A&B, COVID)  [id number]  COMPREHENSIVE METABOLIC PANEL  BRAIN NATRIURETIC PEPTIDE  TROPONIN I (HIGH SENSITIVITY)    EKG EKG Interpretation Date/Time:  Tuesday April 15 2023 14:30:50 EDT Ventricular Rate:  109 PR Interval:  108 QRS Duration:  175 QT Interval:  450 QTC Calculation: 607 R Axis:   -67  Text Interpretation: Sinus tachycardia Left bundle branch block similar to previous Confirmed by Coralee Pesa ([id number]) on 04/15/2023 2:35:21 PM  Radiology No results found.  Procedures .Critical Care  Performed by: Rozelle Logan, DO Authorized by: Rozelle Logan, DO   Critical care provider statement:    Critical care time (minutes):  30   Critical care was necessary to treat or prevent imminent or life-threatening deterioration of the following conditions:  Respiratory failure   Critical care was time spent  personally by me on the following activities:  Development of treatment plan with patient or surrogate, discussions with consultants, evaluation of patient's response to treatment, examination of patient, ordering and review of laboratory studies, ordering and review of radiographic studies, ordering and performing treatments and interventions, pulse oximetry, re-evaluation of patient's condition and review of old charts   I assumed direction of critical care for this patient from another provider in my specialty: no       Medications Ordered in ED Medications  ipratropium-albuterol (DUONEB) 0.5-2.5 (3) MG/3ML nebulizer solution (0 mLs  Hold 04/15/23 1437)  ipratropium-albuterol (DUONEB) 0.5-2.5 (3) MG/3ML nebulizer solution 3 mL (3  mLs Nebulization Given 04/15/23 1436)    ED Course/ Medical Decision Making/ A&P                                 Medical Decision Making Amount and/or Complexity of Data Reviewed Labs: ordered. Radiology: ordered.  Risk Prescription drug management.   67 year old male presents to the emergency department with concern for shortness of breath.  Was found hypoxic and diaphoretic at a friend's house.  Improved on CPAP.  Transition to BiPAP here.  History limited secondary to acuity.  Oxygen saturation is appropriate on BiPAP, blood pressure is slightly hypotensive however he just received a sublingual nitroglycerin just prior to arrival with EMS.  He has diffuse Rales bilaterally with diminished breath sounds at the bases, mild peripheral edema.  Will initiate workup, plan for DuoNeb with continued BiPAP.  Will evaluate the chest x-ray before adding further treatment/medication.  Patient signed out to Dr. Earlene Plater pending results and treatment.        Final Clinical Impression(s) / ED Diagnoses Final diagnoses:  None    Rx / DC Orders ED Discharge Orders     None         Rozelle Logan, DO 04/15/23 1516

## 2023-04-15 NOTE — Assessment & Plan Note (Deleted)
CBG q4 checks. Last A1C 5.5 1 mo ago

## 2023-04-15 NOTE — Assessment & Plan Note (Addendum)
CBG q4 checks. Last A1C 5.5 1 mo ago

## 2023-04-15 NOTE — Hospital Course (Addendum)
Edward Mullen is a 67 y.o.male with a history of  [employer] BiV ICD, CAD, T2DM, HTN, HLD, VT, prior PEA arrest, chronic LBBB, CKD3 and polysubstance abuse who was admitted to the [facility] Medicine Teaching Service at [facility] for multilobar pneumonia and COPD exacerbation. His hospital course is detailed below:  Multilobar PNA Patient presented to the ED with SOB and worsening cough over the past three weeks with yellow sputum and color change from baseline. On presentation via EMS, patient was hypoxic in the 50s and was placed on CPAP and transitioned to BIPAP then eventually weaned to 3L [place] after Duoneb in the ED. CXR demonstrated multilobar pneumonia. Started antibiotics with Ceftriaxone and Doxycycline s/p a dose of Ceftriaxone and Doxy in the ED (9/3). Found to have prolonged Qtc and wide QRS on EKG so Azithromycin was avoided, likely due to mild cardial injury secondary to infection. Weaned off oxygen on evening of 9/4. Transitioned to PO doxycycline and amoxicillin to be started on the morning of 9/6.   COPD History of suspected COPD with no FEV1 found in chart. Not confirmed with PFT studies. Possible GOLD C-D at baseline. Treated with Breo, DuoNebs, and prednisone (40 mg tablet X 5 days) for concern of exacerbation.  Other chronic conditions were medically managed with home medications and formulary alternatives as necessary: NSVT/SVT: Continued amiodarone 200 mg daily and mexiletine 150 mg BID Systolic HFrEF <20%: on GDMT with HF clinic: Torsemide 20 mg, Coreg 3.125 mg BID, held [place] on last visit, and unsure if he is taking Entresto. Differed all medication adjustment to cardiology  HLD: Crestor 5 mg daily T2DM: Last A1c 5.5 1 month ago; Monitored with CBG. Tobacco Use: Discussed nicotine patches for smoking cessation Polysubstance use: last used cocaine 2 weeks ago, encouraged cessation   PCP Follow-up Recommendations: Pulmonology follow-up for PFT testing Recheck BMP, CBC on  follow up Encourage cessation of illicit drugs Close follow up with cardiology

## 2023-04-15 NOTE — Assessment & Plan Note (Addendum)
Encourage cessation, last used cocaine 2 weeks ago.

## 2023-04-15 NOTE — ED Provider Notes (Signed)
Handoff received from prior provider.  Patient with history of CHF, COPD presented due to shortness of breath.  Found to be diaphoretic and hypoxic to the 50s placed on CPAP.  Transition to BiPAP here.  Per chart review, last echo in July 2024 showed markedly reduced EF.  Patient states his been taking his medications and felt acutely worse today with shortness of breath or denies any chest pain.  He said chronic cough which is unchanged.  No fevers or chills.  On exam he has bilateral rails worse in his lower lungs which is consistent with his chest x-ray which shows bibasilar opacity.  He did respond to DuoNebs.  His BNP is normal, less consistent with heart failure and x-ray is more concerning for pneumonia.  Antibiotics ordered.  He was able to be de-escalated off of BiPAP to 3 L nasal cannula.  Will admit for pneumonia with new oxygen requirement.   Laurence Spates, MD 04/15/23 2352

## 2023-04-15 NOTE — ED Notes (Signed)
ED TO INPATIENT HANDOFF REPORT  ED Nurse Name and Phone #:  Corliss Blacker, RN [phone]  S Name/Age/Gender Edward Mullen 67 y.o. male Room/Bed: 026C/026C  Code Status   Code Status: Full Code  Home/SNF/Other Home Patient oriented to: self, place, time, and situation Is this baseline? Yes   Triage Complete: Triage complete  Chief Complaint Pneumonia [J18.9]  Triage Note Pt bib [facility] on CPAP coming from friend's house. EMS states pt was initially diaphoretic with SOB. Pt sat down and had near syncopal episode and began drooling with increased work of breathing. <50% on RA for EMS. NRB 76%. CPAP 86%. Rhonchi and rales throughout. 1 nitroglycerin given by EMS. Pt has hx of CHF, COPD, CAD, and has a pacemaker. Pt he had MI and shingles in April. Pt a&ox4.  EMS vital signs:  152/87 initial then 90/60 Initial 76 120 HR 129 CBG  20LAC    Allergies No Known Allergies  Level of Care/Admitting Diagnosis ED Disposition     ED Disposition  Admit   Condition  --   Comment  Hospital Area: [facility] [[id number]]  Level of Care: Telemetry Medical [104]  May place patient in observation at [facility] or [facility] if equivalent level of care is available:: Yes  Covid Evaluation: Asymptomatic - no recent exposure (last 10 days) testing not required  Diagnosis: Pneumonia [227785]  Admitting Physician: Westley Chandler [[id number]]  Attending Physician: Alfredo Martinez [[id number]]          B Medical/Surgery History Past Medical History:  Diagnosis Date   Arthritis    "feels like it in my legs" (09/20/2014)   CAD (coronary artery disease)    Chronic kidney disease, stage 3a (HCC)    Chronic systolic CHF (congestive heart failure) (HCC)    Cocaine abuse (HCC)    Hypertension    NICM (nonischemic cardiomyopathy) (HCC)    NSVT (nonsustained ventricular tachycardia) (HCC) 09/06/2016   pre diabetes    patient denies   Past Surgical History:  Procedure Laterality Date    BIV ICD INSERTION CRT-D N/A 07/17/2022   Procedure: BIV ICD INSERTION CRT-D;  Surgeon: Duke Salvia, MD;  Location: [facility] INVASIVE CV LAB;  Service: Cardiovascular;  Laterality: N/A;   CARDIAC CATHETERIZATION N/A 08/27/2016   Procedure: Right/Left Heart Cath and Coronary Angiography;  Surgeon: Laurey Morale, MD;  Location: [facility] INVASIVE CV LAB;  Service: Cardiovascular;  Laterality: N/A;   COLONOSCOPY     CYSTECTOMY Right    "back of my shoulder"   RIGHT/LEFT HEART CATH AND CORONARY ANGIOGRAPHY N/A 12/04/2020   Procedure: RIGHT/LEFT HEART CATH AND CORONARY ANGIOGRAPHY;  Surgeon: Laurey Morale, MD;  Location: [facility] INVASIVE CV LAB;  Service: Cardiovascular;  Laterality: N/A;   RIGHT/LEFT HEART CATH AND CORONARY ANGIOGRAPHY N/A 10/10/2021   Procedure: RIGHT/LEFT HEART CATH AND CORONARY ANGIOGRAPHY;  Surgeon: Laurey Morale, MD;  Location: [facility] INVASIVE CV LAB;  Service: Cardiovascular;  Laterality: N/A;   TONSILLECTOMY       A IV Location/Drains/Wounds Patient Lines/Drains/Airways Status     Active Line/Drains/Airways     Name Placement date Placement time Site Days   Peripheral IV 04/15/23 20 G Left Antecubital 04/15/23  1434  Antecubital  less than 1   Peripheral IV 04/15/23 20 G Anterior;Right Forearm 04/15/23  1434  Forearm  less than 1            Intake/Output Last 24 hours No intake or output data in the 24 hours ending  04/15/23 1808  Labs/Imaging Results for orders placed or performed during the hospital encounter of 04/15/23 (from the past 48 hour(s))  CBC with Differential     Status: Abnormal   Collection Time: 04/15/23  2:37 PM  Result Value Ref Range   WBC 8.0 4.0 - 10.5 K/uL   RBC 5.58 4.22 - 5.81 MIL/uL   Hemoglobin 14.9 13.0 - 17.0 g/dL   HCT 16.1 09.6 - 04.5 %   MCV 85.1 80.0 - 100.0 fL   MCH 26.7 26.0 - 34.0 pg   MCHC 31.4 30.0 - 36.0 g/dL   RDW 40.9 (H) 81.1 - 91.4 %   Platelets 165 150 - 400 K/uL   nRBC 0.0 0.0 - 0.2 %   Neutrophils Relative % 69 %    Neutro Abs 5.5 1.7 - 7.7 K/uL   Lymphocytes Relative 13 %   Lymphs Abs 1.0 0.7 - 4.0 K/uL   Monocytes Relative 14 %   Monocytes Absolute 1.1 (H) 0.1 - 1.0 K/uL   Eosinophils Relative 3 %   Eosinophils Absolute 0.3 0.0 - 0.5 K/uL   Basophils Relative 1 %   Basophils Absolute 0.1 0.0 - 0.1 K/uL   Immature Granulocytes 0 %   Abs Immature Granulocytes 0.03 0.00 - 0.07 K/uL    Comment: Performed at [facility], [address], [place], [place] [address]  Comprehensive metabolic panel     Status: Abnormal   Collection Time: 04/15/23  2:37 PM  Result Value Ref Range   Sodium 137 135 - 145 mmol/L   Potassium 4.9 3.5 - 5.1 mmol/L   Chloride 105 98 - 111 mmol/L   CO2 18 (L) 22 - 32 mmol/L   Glucose, Bld 155 (H) 70 - 99 mg/dL    Comment: Glucose reference range applies only to samples taken after fasting for at least 8 hours.   BUN 20 8 - 23 mg/dL   Creatinine, Ser 6.21 (H) 0.61 - 1.24 mg/dL   Calcium 9.3 8.9 - 30.8 mg/dL   Total Protein 8.4 (H) 6.5 - 8.1 g/dL   Albumin 3.4 (L) 3.5 - 5.0 g/dL   AST 38 15 - 41 U/L   ALT 25 0 - 44 U/L   Alkaline Phosphatase 160 (H) 38 - 126 U/L   Total Bilirubin 1.0 0.3 - 1.2 mg/dL   GFR, Estimated 41 (L) >60 mL/min    Comment: (NOTE) Calculated using the CKD-EPI Creatinine Equation (2021)    Anion gap 14 5 - 15    Comment: Performed at [facility], [address] [place], [place] [address]  Troponin I (High Sensitivity)     Status: Abnormal   Collection Time: 04/15/23  2:37 PM  Result Value Ref Range   Troponin I (High Sensitivity) 24 (H) <18 ng/L    Comment: (NOTE) Elevated high sensitivity troponin I (hsTnI) values and significant  changes across serial measurements may suggest ACS but many other  chronic and acute conditions are known to elevate hsTnI results.  Refer to the "Links" section for chest pain algorithms and additional  guidance. Performed at [facility], [address] [place], [place] [address]   Brain  natriuretic peptide     Status: None   Collection Time: 04/15/23  2:37 PM  Result Value Ref Range   B Natriuretic Peptide 66.0 0.0 - 100.0 pg/mL    Comment: Performed at [facility], [address], [place], [place] [address]  I-Stat venous blood  gas, ED     Status: Abnormal   Collection Time: 04/15/23  2:47 PM  Result Value Ref Range   pH, Ven 7.407 7.25 - 7.43   pCO2, Ven 31.8 (L) 44 - 60 mmHg   pO2, Ven 56 (H) 32 - 45 mmHg   Bicarbonate 20.0 20.0 - 28.0 mmol/L   TCO2 21 (L) 22 - 32 mmol/L   O2 Saturation 89 %   Acid-base deficit 3.0 (H) 0.0 - 2.0 mmol/L   Sodium 138 135 - 145 mmol/L   Potassium 4.6 3.5 - 5.1 mmol/L   Calcium, Ion 1.05 (L) 1.15 - 1.40 mmol/L   HCT 49.0 39.0 - 52.0 %   Hemoglobin 16.7 13.0 - 17.0 g/dL   Sample type VENOUS   Resp panel by RT-PCR (RSV, Flu A&B, Covid) Anterior Nasal Swab     Status: None   Collection Time: 04/15/23  3:26 PM   Specimen: Anterior Nasal Swab  Result Value Ref Range   SARS Coronavirus 2 by RT PCR NEGATIVE NEGATIVE   Influenza A by PCR NEGATIVE NEGATIVE   Influenza B by PCR NEGATIVE NEGATIVE    Comment: (NOTE) The Xpert Xpress SARS-CoV-2/FLU/RSV plus assay is intended as an aid in the diagnosis of influenza from Nasopharyngeal swab specimens and should not be used as a sole basis for treatment. Nasal washings and aspirates are unacceptable for Xpert Xpress SARS-CoV-2/FLU/RSV testing.  Fact Sheet for Patients: BloggerCourse.com  Fact Sheet for Healthcare Providers: SeriousBroker.it  This test is not yet approved or cleared by the [place] FDA and has been authorized for detection and/or diagnosis of SARS-CoV-2 by FDA under an Emergency Use Authorization (EUA). This EUA will remain in effect (meaning this test can be used) for the duration of the COVID-19 declaration under Section 564(b)(1) of the Act, 21 U.S.C. section 360bbb-3(b)(1), unless the authorization is  terminated or revoked.     Resp Syncytial Virus by PCR NEGATIVE NEGATIVE    Comment: (NOTE) Fact Sheet for Patients: BloggerCourse.com  Fact Sheet for Healthcare Providers: SeriousBroker.it  This test is not yet approved or cleared by the [place] FDA and has been authorized for detection and/or diagnosis of SARS-CoV-2 by FDA under an Emergency Use Authorization (EUA). This EUA will remain in effect (meaning this test can be used) for the duration of the COVID-19 declaration under Section 564(b)(1) of the Act, 21 U.S.C. section 360bbb-3(b)(1), unless the authorization is terminated or revoked.  Performed at [facility], [address] [place], [place] [address]   Troponin I (High Sensitivity)     Status: Abnormal   Collection Time: 04/15/23  5:02 PM  Result Value Ref Range   Troponin I (High Sensitivity) 30 (H) <18 ng/L    Comment: (NOTE) Elevated high sensitivity troponin I (hsTnI) values and significant  changes across serial measurements may suggest ACS but many other  chronic and acute conditions are known to elevate hsTnI results.  Refer to the "Links" section for chest pain algorithms and additional  guidance. Performed at [facility], [address] [place], [place] [address]    DG Chest Port 1 View  Result Date: 04/15/2023 CLINICAL DATA:  sob.  Respiratory distress. EXAM: PORTABLE CHEST 1 VIEW COMPARISON:  03/10/2023. FINDINGS: There are diffuse heterogeneous alveolar opacities throughout bilateral lungs, favoring multilobar pneumonia. Follow-up to clearing is recommended. No pneumothorax. Bilateral lateral costophrenic angles are clear. Stable cardio-mediastinal silhouette. There is a left sided 3-lead pacemaker. No acute osseous abnormalities. The soft tissues  are within normal limits. IMPRESSION: Multilobar pneumonia.  Follow-up to clearing is recommended. Electronically Signed   By: Jules Schick  M.D.   On: 04/15/2023 16:23    Pending Labs Unresulted Labs (From admission, onward)     Start     Ordered   04/16/23 0500  CBC  Daily,   R      04/15/23 1756   04/16/23 0500  Basic metabolic panel  Tomorrow morning,   R        04/15/23 1757   04/15/23 1756  MRSA Next Gen by PCR, Nasal  (Non-severe pneumonia (non-ICU care) in adult without resistant organism risk factors.)  Once,   R        04/15/23 1756   04/15/23 1631  Blood culture (routine x 2)  BLOOD CULTURE X 2,   R (with STAT occurrences)      04/15/23 1630            Vitals/Pain Today's Vitals   04/15/23 1545 04/15/23 1600 04/15/23 1641 04/15/23 1645  BP: 98/75 95/71  100/72  Pulse: 87 84 78 79  Resp: (!) 26 (!) 22 (!) 23 (!) 27  Temp:      TempSrc:      SpO2: 97% 99% 98% 92%  Weight:      Height:      PainSc:        Isolation Precautions No active isolations  Medications Medications  ipratropium-albuterol (DUONEB) 0.5-2.5 (3) MG/3ML nebulizer solution (0 mLs  Hold 04/15/23 1437)  doxycycline (VIBRAMYCIN) 100 mg in sodium chloride 0.9 % 250 mL IVPB (100 mg Intravenous New Bag/Given 04/15/23 1723)  enoxaparin (LOVENOX) injection 40 mg (has no administration in time range)  azithromycin (ZITHROMAX) 500 mg in sodium chloride 0.9 % 250 mL IVPB (has no administration in time range)  fluticasone furoate-vilanterol (BREO ELLIPTA) 100-25 MCG/ACT 1 puff (has no administration in time range)  ipratropium-albuterol (DUONEB) 0.5-2.5 (3) MG/3ML nebulizer solution 3 mL (has no administration in time range)  cefTRIAXone (ROCEPHIN) 1 g in sodium chloride 0.9 % 100 mL IVPB (has no administration in time range)  ipratropium-albuterol (DUONEB) 0.5-2.5 (3) MG/3ML nebulizer solution 3 mL (3 mLs Nebulization Given 04/15/23 1436)  cefTRIAXone (ROCEPHIN) 1 g in sodium chloride 0.9 % 100 mL IVPB (0 g Intravenous Stopped 04/15/23 1738)    Mobility walks with person assist     Focused Assessments Pulmonary Assessment Handoff:  Lung  sounds: Bilateral Breath Sounds: Coarse crackles O2 Device: Nasal Cannula O2 Flow Rate (L/min): 3 L/min    R Recommendations: See Admitting Provider Note  Report given to:   Additional Notes:

## 2023-04-15 NOTE — ED Triage Notes (Signed)
Pt bib [facility] on CPAP coming from friend's house. EMS states pt was initially diaphoretic with SOB. Pt sat down and had near syncopal episode and began drooling with increased work of breathing. <50% on RA for EMS. NRB 76%. CPAP 86%. Rhonchi and rales throughout. 1 nitroglycerin given by EMS. Pt has hx of CHF, COPD, CAD, and has a pacemaker. Pt he had MI and shingles in April. Pt a&ox4.  EMS vital signs:  152/87 initial then 90/60 Initial 76 120 HR 129 CBG  20LAC

## 2023-04-15 NOTE — Progress Notes (Deleted)
Assessment & Plan PNA (pneumonia) Chest x-ray showed evidence of multifocal pneumonia.  Will treat for this with CAP coverage (long QT avoid Azithro).  Will additionally treat for concern of COPD exacerbation with DuoNebs and Breo. -Admit to [facility] med/tele, Dr. Manson Passey as attending - Pulmonary toilet, incentive spirometry - Ceftriaxone and doxycycline  - MRSA swab - Status post ceftriaxone and Doxy x 1 dose in ED  - AM CBC, BMP  - DuoNebs - Breo - Consider oral steroids if indicated -- Wean O2 as tolerated 88%-92% Tobacco abuse Smokes 1 to 2 cigarettes every 1 week, discuss nicotine patch.  Polysubstance abuse (HCC) Encourage cessation, last used cocaine 2 weeks ago. Type 2 diabetes mellitus with stage 3b chronic kidney disease, without long-term current use of insulin (HCC) CBG q4 checks. Last A1C 5.5 1 mo ago  COPD (chronic obstructive pulmonary disease) (HCC) Possible GOLD C-D at baseline, no FEV1 in chart. Breo, Duonebs, and steroids for concern of exacerbation.

## 2023-04-15 NOTE — Assessment & Plan Note (Addendum)
Possible GOLD C-D at baseline, no FEV1 in chart. Breo, Duonebs, and steroids for concern of exacerbation.

## 2023-04-15 NOTE — Assessment & Plan Note (Deleted)
Smokes 1 to 2 cigarettes every 1 week, discuss nicotine patch.

## 2023-04-15 NOTE — Assessment & Plan Note (Addendum)
Smokes 1 to 2 cigarettes every 1 week, discuss nicotine patch.

## 2023-04-16 ENCOUNTER — Encounter (HOSPITAL_COMMUNITY): Payer: Self-pay | Admitting: Family Medicine

## 2023-04-16 DIAGNOSIS — J189 Pneumonia, unspecified organism: Secondary | ICD-10-CM | POA: Diagnosis not present

## 2023-04-16 DIAGNOSIS — R7989 Other specified abnormal findings of blood chemistry: Secondary | ICD-10-CM

## 2023-04-16 LAB — CBC
HCT: 39.5 % (ref 39.0–52.0)
Hemoglobin: 12.5 g/dL — ABNORMAL LOW (ref 13.0–17.0)
MCH: 26.5 pg (ref 26.0–34.0)
MCHC: 31.6 g/dL (ref 30.0–36.0)
MCV: 83.7 fL (ref 80.0–100.0)
Platelets: 126 10*3/uL — ABNORMAL LOW (ref 150–400)
RBC: 4.72 MIL/uL (ref 4.22–5.81)
RDW: 17.3 % — ABNORMAL HIGH (ref 11.5–15.5)
WBC: 5.9 10*3/uL (ref 4.0–10.5)
nRBC: 0 % (ref 0.0–0.2)

## 2023-04-16 LAB — BASIC METABOLIC PANEL
Anion gap: 12 (ref 5–15)
BUN: 19 mg/dL (ref 8–23)
CO2: 20 mmol/L — ABNORMAL LOW (ref 22–32)
Calcium: 8.9 mg/dL (ref 8.9–10.3)
Chloride: 104 mmol/L (ref 98–111)
Creatinine, Ser: 1.49 mg/dL — ABNORMAL HIGH (ref 0.61–1.24)
GFR, Estimated: 51 mL/min — ABNORMAL LOW (ref >60)
Glucose, Bld: 98 mg/dL (ref 70–99)
Potassium: 4 mmol/L (ref 3.5–5.1)
Sodium: 136 mmol/L (ref 135–145)

## 2023-04-16 LAB — GLUCOSE, CAPILLARY
Glucose-Capillary: 178 mg/dL — ABNORMAL HIGH (ref 70–99)
Glucose-Capillary: 161 mg/dL — ABNORMAL HIGH (ref 70–99)

## 2023-04-16 LAB — CBG MONITORING, ED: Glucose-Capillary: 107 mg/dL — ABNORMAL HIGH (ref 70–99)

## 2023-04-16 NOTE — Consult Note (Signed)
Cardiology Consultation   Patient ID: NIM GELWICKS MRN: [id number]; DOB: [birth year]  Admit date: 04/15/2023 Date of Consult: 04/16/2023  PCP:  Loura Back, NP   [facility] Providers Cardiologist:  None  Electrophysiologist:  Sherryl Manges, MD  Advanced Heart Failure:  Marca Ancona, MD       Patient Profile:   Edward Mullen is a 67 y.o. male with a hx of HFrEF (EF <20%) s/p St Jude BiV ICD, CAD, DM2, HTN, HLD, VT, prior PEA arrest, chronic LBBB, CKD3 and polysubstance abuse who is being seen 04/16/2023 for the evaluation of troponin elevation at the request of Dr. Fatima Blank.  History of Present Illness:   Edward Mullen reports that over the past 1 month he has had a cough productive of greenish sputum. No fevers, chills, or sick contacts. Yesterday, he was leaving a friends house when he suddenly became SOB prompting him to come to the ED. Denies chest pain, swelling, PND, orthopnea, weakness, urinary changes, ICD shocks, palpitations, syncope or presyncope. He was last seen by cardiology during a hospitalization at [facility] in July for syncope/presyncope. Since this time he has not followed up with cardiology. Despite this he says that he takes all of his medications as prescribed.  In the ED his VS were T 35.8C, HR 103, BP 93/69, RR 19 and satting 96% on 3L. Labs notable for sCr 1.77, bicarb 18, normal CBC, troponins 24 -> 30 -> 42, BNP 66, neg COVID. CXR revealed a multilobar PNA. ECG showed sinus tachy with LBBB. He was started on Abx and admitted to medicine.    Past Medical History:  Diagnosis Date   Arthritis    "feels like it in my legs" (09/20/2014)   CAD (coronary artery disease)    Chronic kidney disease, stage 3a (HCC)    Chronic systolic CHF (congestive heart failure) (HCC)    Cocaine abuse (HCC)    Hypertension    NICM (nonischemic cardiomyopathy) (HCC)    NSVT (nonsustained ventricular tachycardia) (HCC) 09/06/2016   pre diabetes    patient denies     Past Surgical History:  Procedure Laterality Date   BIV ICD INSERTION CRT-D N/A 07/17/2022   Procedure: BIV ICD INSERTION CRT-D;  Surgeon: Duke Salvia, MD;  Location: [facility] INVASIVE CV LAB;  Service: Cardiovascular;  Laterality: N/A;   CARDIAC CATHETERIZATION N/A 08/27/2016   Procedure: Right/Left Heart Cath and Coronary Angiography;  Surgeon: Laurey Morale, MD;  Location: [facility] INVASIVE CV LAB;  Service: Cardiovascular;  Laterality: N/A;   COLONOSCOPY     CYSTECTOMY Right    "back of my shoulder"   RIGHT/LEFT HEART CATH AND CORONARY ANGIOGRAPHY N/A 12/04/2020   Procedure: RIGHT/LEFT HEART CATH AND CORONARY ANGIOGRAPHY;  Surgeon: Laurey Morale, MD;  Location: [facility] INVASIVE CV LAB;  Service: Cardiovascular;  Laterality: N/A;   RIGHT/LEFT HEART CATH AND CORONARY ANGIOGRAPHY N/A 10/10/2021   Procedure: RIGHT/LEFT HEART CATH AND CORONARY ANGIOGRAPHY;  Surgeon: Laurey Morale, MD;  Location: [facility] INVASIVE CV LAB;  Service: Cardiovascular;  Laterality: N/A;   TONSILLECTOMY       Home Medications:  Prior to Admission medications   Medication Sig Start Date End Date Taking? Authorizing Provider  albuterol (PROVENTIL) (2.5 MG/3ML) 0.083% nebulizer solution USE ONE VIAL (2.5 MG TOTAL) BY NEBULIZATION EVERY 6 (SIX) HOURS AS NEEDED FOR SHORTNESS OF BREATH OR WHEEZING. 10/29/22  Yes Morene Crocker, MD  albuterol (VENTOLIN HFA) 108 (90 Base) MCG/ACT inhaler INHALE 1 TO 2 PUFFS BY MOUTH  EVERY 6 HOURS AS NEEDED FOR WHEEZING OR SHORTNESS OF BREATH 10/29/22  Yes Morene Crocker, MD  amiodarone (PACERONE) 200 MG tablet Take 1 tablet (200 mg total) by mouth daily. 11/26/22  Yes Zannie Cove, MD  ASPIRIN LOW DOSE 81 MG tablet TAKE 1 TABLET (81 MG TOTAL) BY MOUTH DAILY. SWALLOW WHOLE(AM) Patient taking differently: Take 81 mg by mouth daily. 01/27/23  Yes Milford, Jessica M, FNP  BREO ELLIPTA 100-25 MCG/ACT AEPB Inhale 1 puff into the lungs daily. 08/02/22 08/02/23 Yes Morene Crocker, MD   carvedilol (COREG) 3.125 MG tablet Take 1 tablet (3.125 mg total) by mouth 2 (two) times daily. 01/29/22 03/25/24 Yes Milford, Anderson Malta, FNP  Cholecalciferol (VITAMIN D3) 50 MCG (2000 UT) TABS Take 2,000 Units by mouth daily.   Yes [provider]  gabapentin (NEURONTIN) 100 MG capsule Take 2 capsules (200 mg total) by mouth 3 (three) times daily. Patient taking differently: Take 300 mg by mouth 2 (two) times daily. 12/16/22 03/25/24 Yes Rocky Morel, DO  JARDIANCE 10 MG TABS tablet Take 10 mg by mouth daily. 04/03/23  Yes [provider]  mexiletine (MEXITIL) 150 MG capsule Take 1 capsule (150 mg total) by mouth 2 (two) times daily. 07/31/22  Yes Duke Salvia, MD  rosuvastatin (CRESTOR) 5 MG tablet TAKE 1 TABLET (5 MG TOTAL) BY MOUTH DAILY. Patient taking differently: Take 5 mg by mouth daily. 12/26/20  Yes Laurey Morale, MD  torsemide (DEMADEX) 20 MG tablet Take 1 tablet (20 mg total) by mouth daily. 03/26/23 06/24/23 Yes Alen Bleacher, NP  acetaminophen (TYLENOL) 500 MG tablet Take 2 tablets (1,000 mg total) by mouth every 8 (eight) hours as needed. 12/16/22   Rocky Morel, DO  tamsulosin (FLOMAX) 0.4 MG CAPS capsule TAKE ONE CAPSULE BY MOUTH ONCE DAILY AFTER SUPPER Patient not taking: Reported on 03/26/2023 02/20/21   Belva Agee, MD  zolpidem (AMBIEN) 10 MG tablet Take 1 tablet (10 mg total) by mouth at bedtime as needed for sleep. 09/16/22 01/03/23      Inpatient Medications: Scheduled Meds:  amiodarone  200 mg Oral Daily   aspirin EC  81 mg Oral Daily   carvedilol  3.125 mg Oral BID   enoxaparin (LOVENOX) injection  40 mg Subcutaneous Q24H   fluticasone furoate-vilanterol  1 puff Inhalation Daily   gabapentin  200 mg Oral BID   ipratropium-albuterol       mexiletine  150 mg Oral BID   predniSONE  40 mg Oral Q breakfast   rosuvastatin  5 mg Oral Daily   torsemide  20 mg Oral Daily   Continuous Infusions:  cefTRIAXone (ROCEPHIN)  IV     doxycycline  (VIBRAMYCIN) IV     PRN Meds: ipratropium-albuterol, ipratropium-albuterol, nicotine  Allergies:   No Known Allergies  Social History:   Social History   Socioeconomic History   Marital status: Married    Spouse name: Not on file   Number of children: Not on file   Years of education: Not on file   Highest education level: Not on file  Occupational History   Occupation: Special educational needs teacher    Comment: has not been able to work steadily for the last year or two  Tobacco Use   Smoking status: Some Days    Current packs/day: 0.10    Average packs/day: 0.1 packs/day for 48.0 years (4.8 ttl pk-yrs)    Types: Cigarettes   Smokeless tobacco: Never   Tobacco comments:    cutting back 2  per day.  No cigs  Vaping Use   Vaping status: Never Used  Substance and Sexual Activity   Alcohol use: Yes    Alcohol/week: 1.0 standard drink of alcohol    Types: 1 Cans of beer per week    Comment: occasionally   Drug use: Yes    Types: Marijuana    Comment: daily 4 times last 2 weeks ago as of 05/31/2019   Sexual activity: Yes    Birth control/protection: None  Other Topics Concern   Not on file  Social History Narrative   Not on file   Social Determinants of Health   Financial Resource Strain: Low Risk  (06/17/2022)   Overall Financial Resource Strain (CARDIA)    Difficulty of Paying Living Expenses: Not hard at all  Food Insecurity: No Food Insecurity (03/11/2023)   Hunger Vital Sign    Worried About Running Out of Food in the Last Year: Never true    Ran Out of Food in the Last Year: Never true  Transportation Needs: Unmet Transportation Needs (03/11/2023)   PRAPARE - Administrator, Civil Service (Medical): Yes    Lack of Transportation (Non-Medical): No  Physical Activity: Inactive (06/17/2022)   Exercise Vital Sign    Days of Exercise per Week: 0 days    Minutes of Exercise per Session: 0 min  Stress: No Stress Concern Present (06/17/2022)   [employer] of  Occupational Health - Occupational Stress Questionnaire    Feeling of Stress : Not at all  Social Connections: Unknown (03/04/2023)   Received from [facility]   Social Network    Social Network: Not on file  Intimate Partner Violence: Not At Risk (03/11/2023)   Humiliation, Afraid, Rape, and Kick questionnaire    Fear of Current or Ex-Partner: No    Emotionally Abused: No    Physically Abused: No    Sexually Abused: No    Family History:    Family History  Problem Relation Age of Onset   Stroke Mother 23   Heart disease Father 54   Colon cancer Neg Hx    Colon polyps Neg Hx    Esophageal cancer Neg Hx    Stomach cancer Neg Hx    Rectal cancer Neg Hx      ROS:  Please see the history of present illness.  All other ROS reviewed and negative.     Physical Exam/Data:   Vitals:   04/15/23 1900 04/15/23 2000 04/15/23 2100 04/16/23 0000  BP: 94/69 102/78 104/72 107/71  Pulse: 74 72 70 63  Resp: (!) 21 (!) 23 (!) 21 20  Temp:      TempSrc:      SpO2: 94% 97% 96% 99%  Weight:      Height:        Intake/Output Summary (Last 24 hours) at 04/16/2023 0107 Last data filed at 04/15/2023 1923 Gross per 24 hour  Intake 250 ml  Output --  Net 250 ml      04/15/2023    2:31 PM 03/26/2023   10:23 AM 03/12/2023    6:04 AM  Last 3 Weights  Weight (lbs) 185 lb 188 lb 9.6 oz 181 lb 4.8 oz  Weight (kg) 83.915 kg 85.548 kg 82.237 kg     Body mass index is 25.09 kg/m.  General:  Chronically ill appearing male in NAD, very communicative HEENT: normal Neck: no JVD Vascular: No carotid bruits; Distal pulses 2+ bilaterally Cardiac:  normal S1, S2; RRR;  no murmur, rubs or gallops Lungs:  Bibasilar rales, no wheezes or ronchi Abd: soft, nontender, no hepatomegaly  Ext: no edema Musculoskeletal:  No deformities, BUE and BLE strength normal and equal Skin: warm and dry  Neuro:  CNs 2-12 intact, no focal abnormalities noted Psych:  Normal affect   EKG       Telemetry:   Telemetry was personally reviewed and demonstrates:  NSR with LBBB  Relevant CV Studies:  TTE 03/11/23:  IMPRESSIONS     1. Left ventricular ejection fraction, by estimation, is <20%. The left  ventricle has severely decreased function. The left ventricle demonstrates  global hypokinesis. No LV thrombus noted on contrast images. The left  ventricular internal cavity size was   severely dilated. Left ventricular diastolic parameters are consistent  with Grade I diastolic dysfunction (impaired relaxation).   2. Right ventricular systolic function is mildly reduced. The right  ventricular size is normal. Tricuspid regurgitation signal is inadequate  for assessing PA pressure.   3. Left atrial size was mildly dilated.   4. Right atrial size was mildly dilated.   5. The mitral valve is normal in structure. Mild mitral valve  regurgitation. No evidence of mitral stenosis.   6. The aortic valve is tricuspid. Aortic valve regurgitation is mild to  moderate. No aortic stenosis is present.   7. Aortic dilatation noted. There is mild dilatation of the aortic root,  measuring 40 mm.   LHC 10/10/21:    1st RPL lesion is 80% stenosed.   Mid LAD lesion is 30% stenosed.   1. Stable coronaries, there is 80% stenosis in a relatively small PLV branch.  2. Mildly elevated PCWP and LVEDP, normal RA pressure.  3. Cardiac index is decreased at 1.9.    Laboratory Data:  High Sensitivity Troponin:   Recent Labs  Lab 04/15/23 1437 04/15/23 1702 04/15/23 2105  TROPONINIHS 24* 30* 42*     Chemistry Recent Labs  Lab 04/15/23 1437 04/15/23 1447  NA 137 138  K 4.9 4.6  CL 105  --   CO2 18*  --   GLUCOSE 155*  --   BUN 20  --   CREATININE 1.77*  --   CALCIUM 9.3  --   GFRNONAA 41*  --   ANIONGAP 14  --     Recent Labs  Lab 04/15/23 1437  PROT 8.4*  ALBUMIN 3.4*  AST 38  ALT 25  ALKPHOS 160*  BILITOT 1.0   Lipids No results for input(s): "CHOL", "TRIG", "HDL", "LABVLDL",  "LDLCALC", "CHOLHDL" in the last 168 hours.  Hematology Recent Labs  Lab 04/15/23 1437 04/15/23 1447  WBC 8.0  --   RBC 5.58  --   HGB 14.9 16.7  HCT 47.5 49.0  MCV 85.1  --   MCH 26.7  --   MCHC 31.4  --   RDW 17.6*  --   PLT 165  --    Thyroid No results for input(s): "TSH", "FREET4" in the last 168 hours.  BNP Recent Labs  Lab 04/15/23 1437  BNP 66.0    DDimer No results for input(s): "DDIMER" in the last 168 hours.   Radiology/Studies:  DG Chest Port 1 View  Result Date: 04/15/2023 CLINICAL DATA:  sob.  Respiratory distress. EXAM: PORTABLE CHEST 1 VIEW COMPARISON:  03/10/2023. FINDINGS: There are diffuse heterogeneous alveolar opacities throughout bilateral lungs, favoring multilobar pneumonia. Follow-up to clearing is recommended. No pneumothorax. Bilateral lateral costophrenic angles are clear. Stable cardio-mediastinal silhouette. There is  a left sided 3-lead pacemaker. No acute osseous abnormalities. The soft tissues are within normal limits. IMPRESSION: Multilobar pneumonia.  Follow-up to clearing is recommended. Electronically Signed   By: Jules Schick M.D.   On: 04/15/2023 16:23     Assessment and Plan:   Edward Mullen is a 67 y.o. male with a hx of HFrEF (EF <20%) s/p St Jude BiV ICD, CAD, DM2, HTN, HLD, VT, prior PEA arrest, chronic LBBB, CKD3 and polysubstance abuse who is being seen 04/16/2023 for the evaluation of troponin elevation at the request of Dr. Fatima Blank.  #Elevated Troponins ::Troponins mildly elevated on admission in the setting of PNA. Appears to be euvolemic to slightly dry on exam. No symptoms of chest pain. His ECG shows a LBBB which is chronic. Suspect this is myocardial injury in the setting of PNA and not ACS. I interrogated his device and there was one episode of NSVT on 8/26 that lasted a few seconds. Otherwise no events.  -likely myocardial injury and not ACS -no need to continue trending troponins unless his symptoms change   Risk  Assessment/Risk Scores:        New York Heart Association (NYHA) Functional Class NYHA Class II        For questions or updates, please contact [facility] Please consult [email or web address] for contact info under    Signed, Karl Ito, MD  04/16/2023 1:07 AM

## 2023-04-16 NOTE — ED Notes (Signed)
ED TO INPATIENT HANDOFF REPORT  ED Nurse Name and Phone #: Cat [phone]  S Name/Age/Gender Edward Mullen 67 y.o. male Room/Bed: 044C/044C  Code Status   Code Status: Full Code  Home/SNF/Other Home Patient oriented to: self, place, time, and situation Is this baseline? Yes   Triage Complete: Triage complete  Chief Complaint Pneumonia [J18.9] PNA (pneumonia) [J18.9]  Triage Note Pt bib [facility] on CPAP coming from friend's house. EMS states pt was initially diaphoretic with SOB. Pt sat down and had near syncopal episode and began drooling with increased work of breathing. <50% on RA for EMS. NRB 76%. CPAP 86%. Rhonchi and rales throughout. 1 nitroglycerin given by EMS. Pt has hx of CHF, COPD, CAD, and has a pacemaker. Pt he had MI and shingles in April. Pt a&ox4.  EMS vital signs:  152/87 initial then 90/60 Initial 76 120 HR 129 CBG  20LAC    Allergies No Known Allergies  Level of Care/Admitting Diagnosis ED Disposition     ED Disposition  Admit   Condition  --   Comment  Hospital Area: [facility] [[id number]]  Level of Care: Progressive [102]  Admit to Progressive based on following criteria: RESPIRATORY PROBLEMS hypoxemic/hypercapnic respiratory failure that is responsive to NIPPV (BiPAP) or High Flow Nasal Cannula (6-80 lpm). Frequent assessment/intervention, no > Q2 hrs < Q4 hrs, to maintain oxygenation and pulmonary hygiene.  May admit patient to [facility] or [facility] if equivalent level of care is available:: No  Covid Evaluation: Asymptomatic - no recent exposure (last 10 days) testing not required  Diagnosis: PNA (pneumonia) [034742]  Admitting Physician: Westley Chandler [[id number]]  Attending Physician: Westley Chandler [[id number]]  Certification:: I certify this patient will need inpatient services for at least 2 midnights  Expected Medical Readiness: 04/17/2023          B Medical/Surgery History Past Medical History:  Diagnosis Date    Arthritis    "feels like it in my legs" (09/20/2014)   CAD (coronary artery disease)    Chronic kidney disease, stage 3a (HCC)    Chronic systolic CHF (congestive heart failure) (HCC)    Cocaine abuse (HCC)    Hypertension    NICM (nonischemic cardiomyopathy) (HCC)    NSVT (nonsustained ventricular tachycardia) (HCC) 09/06/2016   pre diabetes    patient denies   Past Surgical History:  Procedure Laterality Date   BIV ICD INSERTION CRT-D N/A 07/17/2022   Procedure: BIV ICD INSERTION CRT-D;  Surgeon: Duke Salvia, MD;  Location: [facility] INVASIVE CV LAB;  Service: Cardiovascular;  Laterality: N/A;   CARDIAC CATHETERIZATION N/A 08/27/2016   Procedure: Right/Left Heart Cath and Coronary Angiography;  Surgeon: Laurey Morale, MD;  Location: [facility] INVASIVE CV LAB;  Service: Cardiovascular;  Laterality: N/A;   COLONOSCOPY     CYSTECTOMY Right    "back of my shoulder"   RIGHT/LEFT HEART CATH AND CORONARY ANGIOGRAPHY N/A 12/04/2020   Procedure: RIGHT/LEFT HEART CATH AND CORONARY ANGIOGRAPHY;  Surgeon: Laurey Morale, MD;  Location: [facility] INVASIVE CV LAB;  Service: Cardiovascular;  Laterality: N/A;   RIGHT/LEFT HEART CATH AND CORONARY ANGIOGRAPHY N/A 10/10/2021   Procedure: RIGHT/LEFT HEART CATH AND CORONARY ANGIOGRAPHY;  Surgeon: Laurey Morale, MD;  Location: [facility] INVASIVE CV LAB;  Service: Cardiovascular;  Laterality: N/A;   TONSILLECTOMY       A IV Location/Drains/Wounds Patient Lines/Drains/Airways Status     Active Line/Drains/Airways     Name Placement date Placement time Site Days  Peripheral IV 04/15/23 20 G Left Antecubital 04/15/23  1434  Antecubital  1   Peripheral IV 04/15/23 20 G Anterior;Right Forearm 04/15/23  1434  Forearm  1            Intake/Output Last 24 hours  Intake/Output Summary (Last 24 hours) at 04/16/2023 1154 Last data filed at 04/16/2023 1020 Gross per 24 hour  Intake 600 ml  Output 625 ml  Net -25 ml    Labs/Imaging Results for orders placed or performed  during the hospital encounter of 04/15/23 (from the past 48 hour(s))  CBC with Differential     Status: Abnormal   Collection Time: 04/15/23  2:37 PM  Result Value Ref Range   WBC 8.0 4.0 - 10.5 K/uL   RBC 5.58 4.22 - 5.81 MIL/uL   Hemoglobin 14.9 13.0 - 17.0 g/dL   HCT 40.9 81.1 - 91.4 %   MCV 85.1 80.0 - 100.0 fL   MCH 26.7 26.0 - 34.0 pg   MCHC 31.4 30.0 - 36.0 g/dL   RDW 78.2 (H) 95.6 - 21.3 %   Platelets 165 150 - 400 K/uL   nRBC 0.0 0.0 - 0.2 %   Neutrophils Relative % 69 %   Neutro Abs 5.5 1.7 - 7.7 K/uL   Lymphocytes Relative 13 %   Lymphs Abs 1.0 0.7 - 4.0 K/uL   Monocytes Relative 14 %   Monocytes Absolute 1.1 (H) 0.1 - 1.0 K/uL   Eosinophils Relative 3 %   Eosinophils Absolute 0.3 0.0 - 0.5 K/uL   Basophils Relative 1 %   Basophils Absolute 0.1 0.0 - 0.1 K/uL   Immature Granulocytes 0 %   Abs Immature Granulocytes 0.03 0.00 - 0.07 K/uL    Comment: Performed at [facility], [address], [place], [place] [address]  Comprehensive metabolic panel     Status: Abnormal   Collection Time: 04/15/23  2:37 PM  Result Value Ref Range   Sodium 137 135 - 145 mmol/L   Potassium 4.9 3.5 - 5.1 mmol/L   Chloride 105 98 - 111 mmol/L   CO2 18 (L) 22 - 32 mmol/L   Glucose, Bld 155 (H) 70 - 99 mg/dL    Comment: Glucose reference range applies only to samples taken after fasting for at least 8 hours.   BUN 20 8 - 23 mg/dL   Creatinine, Ser 8.46 (H) 0.61 - 1.24 mg/dL   Calcium 9.3 8.9 - 96.2 mg/dL   Total Protein 8.4 (H) 6.5 - 8.1 g/dL   Albumin 3.4 (L) 3.5 - 5.0 g/dL   AST 38 15 - 41 U/L   ALT 25 0 - 44 U/L   Alkaline Phosphatase 160 (H) 38 - 126 U/L   Total Bilirubin 1.0 0.3 - 1.2 mg/dL   GFR, Estimated 41 (L) >60 mL/min    Comment: (NOTE) Calculated using the CKD-EPI Creatinine Equation (2021)    Anion gap 14 5 - 15    Comment: Performed at [facility], [address] [place], [place] [address]  Troponin I (High Sensitivity)     Status: Abnormal    Collection Time: 04/15/23  2:37 PM  Result Value Ref Range   Troponin I (High Sensitivity) 24 (H) <18 ng/L    Comment: (NOTE) Elevated high sensitivity troponin I (hsTnI) values and significant  changes across serial measurements may suggest ACS but many other  chronic and acute conditions are known to elevate hsTnI results.  Refer to the "Links" section  for chest pain algorithms and additional  guidance. Performed at [facility], [address] [place], [place] [address]   Brain natriuretic peptide     Status: None   Collection Time: 04/15/23  2:37 PM  Result Value Ref Range   B Natriuretic Peptide 66.0 0.0 - 100.0 pg/mL    Comment: Performed at [facility], [address] [place], [place] [address]  I-Stat venous blood gas, ED     Status: Abnormal   Collection Time: 04/15/23  2:47 PM  Result Value Ref Range   pH, Ven 7.407 7.25 - 7.43   pCO2, Ven 31.8 (L) 44 - 60 mmHg   pO2, Ven 56 (H) 32 - 45 mmHg   Bicarbonate 20.0 20.0 - 28.0 mmol/L   TCO2 21 (L) 22 - 32 mmol/L   O2 Saturation 89 %   Acid-base deficit 3.0 (H) 0.0 - 2.0 mmol/L   Sodium 138 135 - 145 mmol/L   Potassium 4.6 3.5 - 5.1 mmol/L   Calcium, Ion 1.05 (L) 1.15 - 1.40 mmol/L   HCT 49.0 39.0 - 52.0 %   Hemoglobin 16.7 13.0 - 17.0 g/dL   Sample type VENOUS   Resp panel by RT-PCR (RSV, Flu A&B, Covid) Anterior Nasal Swab     Status: None   Collection Time: 04/15/23  3:26 PM   Specimen: Anterior Nasal Swab  Result Value Ref Range   SARS Coronavirus 2 by RT PCR NEGATIVE NEGATIVE   Influenza A by PCR NEGATIVE NEGATIVE   Influenza B by PCR NEGATIVE NEGATIVE    Comment: (NOTE) The Xpert Xpress SARS-CoV-2/FLU/RSV plus assay is intended as an aid in the diagnosis of influenza from Nasopharyngeal swab specimens and should not be used as a sole basis for treatment. Nasal washings and aspirates are unacceptable for Xpert Xpress SARS-CoV-2/FLU/RSV testing.  Fact Sheet for  Patients: BloggerCourse.com  Fact Sheet for Healthcare Providers: SeriousBroker.it  This test is not yet approved or cleared by the [place] FDA and has been authorized for detection and/or diagnosis of SARS-CoV-2 by FDA under an Emergency Use Authorization (EUA). This EUA will remain in effect (meaning this test can be used) for the duration of the COVID-19 declaration under Section 564(b)(1) of the Act, 21 U.S.C. section 360bbb-3(b)(1), unless the authorization is terminated or revoked.     Resp Syncytial Virus by PCR NEGATIVE NEGATIVE    Comment: (NOTE) Fact Sheet for Patients: BloggerCourse.com  Fact Sheet for Healthcare Providers: SeriousBroker.it  This test is not yet approved or cleared by the [place] FDA and has been authorized for detection and/or diagnosis of SARS-CoV-2 by FDA under an Emergency Use Authorization (EUA). This EUA will remain in effect (meaning this test can be used) for the duration of the COVID-19 declaration under Section 564(b)(1) of the Act, 21 U.S.C. section 360bbb-3(b)(1), unless the authorization is terminated or revoked.  Performed at [facility], [address], [place], [place] [address]   Blood culture (routine x 2)     Status: None (Preliminary result)   Collection Time: 04/15/23  5:00 PM   Specimen: BLOOD  Result Value Ref Range   Specimen Description BLOOD RIGHT ANTECUBITAL    Special Requests      BOTTLES DRAWN AEROBIC ONLY Blood Culture results may not be optimal due to an inadequate volume of blood received in culture bottles   Culture      NO GROWTH < 24 HOURS Performed at [facility], [address], [place], [place] [address]    Report Status PENDING   Troponin I (High Sensitivity)     Status: Abnormal   Collection Time: 04/15/23  5:02 PM  Result Value Ref Range   Troponin I (High Sensitivity) 30 (H)  <18 ng/L    Comment: (NOTE) Elevated high sensitivity troponin I (hsTnI) values and significant  changes across serial measurements may suggest ACS but many other  chronic and acute conditions are known to elevate hsTnI results.  Refer to the "Links" section for chest pain algorithms and additional  guidance. Performed at [facility], [address] [place], [place] [address]   Blood culture (routine x 2)     Status: None (Preliminary result)   Collection Time: 04/15/23  5:02 PM   Specimen: BLOOD RIGHT HAND  Result Value Ref Range   Specimen Description BLOOD RIGHT HAND    Special Requests      BOTTLES DRAWN AEROBIC ONLY Blood Culture adequate volume   Culture      NO GROWTH < 24 HOURS Performed at [facility], [address] [place], [place] [address]    Report Status PENDING   Troponin I (High Sensitivity)     Status: Abnormal   Collection Time: 04/15/23  9:05 PM  Result Value Ref Range   Troponin I (High Sensitivity) 42 (H) <18 ng/L    Comment: (NOTE) Elevated high sensitivity troponin I (hsTnI) values and significant  changes across serial measurements may suggest ACS but many other  chronic and acute conditions are known to elevate hsTnI results.  Refer to the "Links" section for chest pain algorithms and additional  guidance. Performed at [facility], [address], [place], [place] [address]   CBC     Status: Abnormal   Collection Time: 04/16/23  1:22 AM  Result Value Ref Range   WBC 5.9 4.0 - 10.5 K/uL   RBC 4.72 4.22 - 5.81 MIL/uL   Hemoglobin 12.5 (L) 13.0 - 17.0 g/dL   HCT 76.2 83.1 - 51.7 %   MCV 83.7 80.0 - 100.0 fL   MCH 26.5 26.0 - 34.0 pg   MCHC 31.6 30.0 - 36.0 g/dL   RDW 61.6 (H) 07.3 - 71.0 %   Platelets 126 (L) 150 - 400 K/uL    Comment: REPEATED TO VERIFY   nRBC 0.0 0.0 - 0.2 %    Comment: Performed at [facility], [address], [place], [place] [address]  Basic metabolic panel     Status: Abnormal   Collection  Time: 04/16/23  1:22 AM  Result Value Ref Range   Sodium 136 135 - 145 mmol/L   Potassium 4.0 3.5 - 5.1 mmol/L   Chloride 104 98 - 111 mmol/L   CO2 20 (L) 22 - 32 mmol/L   Glucose, Bld 98 70 - 99 mg/dL    Comment: Glucose reference range applies only to samples taken after fasting for at least 8 hours.   BUN 19 8 - 23 mg/dL   Creatinine, Ser 8.54 (H) 0.61 - 1.24 mg/dL   Calcium 8.9 8.9 - 62.7 mg/dL   GFR, Estimated 51 (L) >60 mL/min    Comment: (NOTE) Calculated using the CKD-EPI Creatinine Equation (2021)    Anion gap 12 5 - 15    Comment: Performed at [facility], [address], [place], [place] [address]  CBG monitoring, ED     Status: Abnormal   Collection Time: 04/16/23  7:08 AM  Result Value Ref Range   Glucose-Capillary 107 (H) 70 - 99 mg/dL    Comment: Glucose reference range applies only to samples taken after fasting for at least 8 hours.   Comment 1 Notify RN    Comment 2 Document in Chart    DG Chest Port 1 View  Result Date: 04/15/2023 CLINICAL DATA:  sob.  Respiratory distress. EXAM: PORTABLE CHEST 1 VIEW COMPARISON:  03/10/2023. FINDINGS: There are diffuse heterogeneous alveolar opacities throughout bilateral lungs, favoring multilobar pneumonia. Follow-up to clearing is recommended. No pneumothorax. Bilateral lateral costophrenic angles are clear. Stable cardio-mediastinal silhouette. There is a left sided 3-lead pacemaker. No acute osseous abnormalities. The soft tissues are within normal limits. IMPRESSION: Multilobar pneumonia.  Follow-up to clearing is recommended. Electronically Signed   By: Jules Schick M.D.   On: 04/15/2023 16:23    Pending Labs Unresulted Labs (From admission, onward)     Start     Ordered   04/17/23 0500  Basic metabolic panel  Tomorrow morning,   R        04/16/23 0747   04/16/23 0500  CBC  Daily,   R      04/15/23 1756   04/15/23 1756  MRSA Next Gen by PCR, Nasal  (Non-severe pneumonia (non-ICU care) in adult without resistant  organism risk factors.)  Once,   R        04/15/23 1756            Vitals/Pain Today's Vitals   04/16/23 0727 04/16/23 0900 04/16/23 1150 04/16/23 1154  BP:  123/82 128/83   Pulse:  64 (!) 59   Resp:  19 (!) 21   Temp: 98 F (36.7 C)   (!) 97.2 F (36.2 C)  TempSrc: Oral   Oral  SpO2:  100% 100%   Weight:      Height:      PainSc:        Isolation Precautions No active isolations  Medications Medications  ipratropium-albuterol (DUONEB) 0.5-2.5 (3) MG/3ML nebulizer solution (0 mLs  Hold 04/15/23 1437)  enoxaparin (LOVENOX) injection 40 mg (40 mg Subcutaneous Given 04/16/23 0917)  fluticasone furoate-vilanterol (BREO ELLIPTA) 100-25 MCG/ACT 1 puff (1 puff Inhalation Not Given 04/16/23 1116)  ipratropium-albuterol (DUONEB) 0.5-2.5 (3) MG/3ML nebulizer solution 3 mL (has no administration in time range)  cefTRIAXone (ROCEPHIN) 1 g in sodium chloride 0.9 % 100 mL IVPB (0 g Intravenous Stopped 04/16/23 0953)  nicotine (NICODERM CQ - dosed in mg/24 hr) patch 7 mg (has no administration in time range)  amiodarone (PACERONE) tablet 200 mg (200 mg Oral Given 04/16/23 0926)  aspirin EC tablet 81 mg (81 mg Oral Given 04/16/23 0912)  carvedilol (COREG) tablet 3.125 mg (3.125 mg Oral Given 04/16/23 0912)  gabapentin (NEURONTIN) capsule 200 mg (200 mg Oral Given 04/16/23 0912)  rosuvastatin (CRESTOR) tablet 5 mg (5 mg Oral Given 04/16/23 0911)  mexiletine (MEXITIL) capsule 150 mg (150 mg Oral Given 04/16/23 0926)  torsemide (DEMADEX) tablet 20 mg (20 mg Oral Given 04/16/23 0926)  predniSONE (DELTASONE) tablet 40 mg (40 mg Oral Given 04/16/23 0747)  doxycycline (VIBRAMYCIN) 100 mg in sodium chloride 0.9 % 250 mL IVPB (0 mg Intravenous Stopped 04/16/23 0816)  ipratropium-albuterol (DUONEB) 0.5-2.5 (3) MG/3ML nebulizer solution 3 mL (3 mLs Nebulization Given 04/15/23 1436)  cefTRIAXone (ROCEPHIN) 1 g in sodium chloride 0.9 % 100 mL IVPB (0 g Intravenous Stopped 04/15/23 1738)  doxycycline (VIBRAMYCIN) 100 mg in  sodium chloride 0.9 % 250  mL IVPB (0 mg Intravenous Stopped 04/15/23 1923)    Mobility walks with person assist     Focused Assessments Pulmonary Assessment Handoff:  Lung sounds: Bilateral Breath Sounds: Coarse crackles O2 Device: Nasal Cannula O2 Flow Rate (L/min): 3 L/min    R Recommendations: See Admitting Provider Note  Report given to:   Additional Notes:

## 2023-04-16 NOTE — Progress Notes (Incomplete)
Initial Nutrition Assessment  DOCUMENTATION CODES:      INTERVENTION:  ***  NUTRITION DIAGNOSIS:     related to   as evidenced by  .  GOAL:      MONITOR:      REASON FOR ASSESSMENT:   Consult Assessment of nutrition requirement/status  ASSESSMENT:   Pt admitted with acute hypoxemic respiratory failure d/t multifocal PNA. PMH significant for COPD, HFrEF, nonischemic in nature, cocaine use, ventricular tachycardia with biventricular ICD, T2DM.    Wt Readings from Last 19 Encounters:  04/16/23 83.4 kg  03/26/23 85.5 kg  03/12/23 82.2 kg  03/06/23 83 kg  12/16/22 86.2 kg  12/05/22 85.5 kg  11/29/22 86.6 kg  11/26/22 86 kg  08/01/22 93 kg  07/18/22 93 kg  07/17/22 88.5 kg  06/28/22 91.1 kg  06/17/22 89.1 kg  06/17/22 89.1 kg  06/13/22 87.1 kg  06/09/22 85.9 kg  05/23/22 89.9 kg  05/09/22 90.3 kg  04/24/22 91.8 kg   Medications: prednisone, torsemide, IV abx  Labs:  ***  NUTRITION - FOCUSED PHYSICAL EXAM:  {RD Focused Exam List:21252}  Diet Order:   Diet Order             Diet Heart Room service appropriate? Yes; Fluid consistency: Thin  Diet effective now                   EDUCATION NEEDS:      Skin:  Skin Assessment: Reviewed RN Assessment  Last BM:     Height:   Ht Readings from Last 1 Encounters:  04/16/23 6' (1.829 m)    Weight:   Wt Readings from Last 1 Encounters:  04/16/23 83.4 kg   BMI:  Body mass index is 24.94 kg/m.  Estimated Nutritional Needs:   Kcal:     Protein:     Fluid:     Drusilla Kanner, RDN, LDN Clinical Nutrition

## 2023-04-16 NOTE — Progress Notes (Signed)
Progress Note  Patient Name: Edward Mullen Date of Encounter: 04/16/2023  Primary Cardiologist: None   Subjective   Patient seen and examined at his bedside.  No cardiovascular complaints.  He is still coughing  Inpatient Medications    Scheduled Meds:  amiodarone  200 mg Oral Daily   aspirin EC  81 mg Oral Daily   carvedilol  3.125 mg Oral BID   enoxaparin (LOVENOX) injection  40 mg Subcutaneous Q24H   fluticasone furoate-vilanterol  1 puff Inhalation Daily   gabapentin  200 mg Oral BID   mexiletine  150 mg Oral BID   predniSONE  40 mg Oral Q breakfast   rosuvastatin  5 mg Oral Daily   torsemide  20 mg Oral Daily   Continuous Infusions:  cefTRIAXone (ROCEPHIN)  IV     doxycycline (VIBRAMYCIN) IV Stopped (04/16/23 0816)   PRN Meds: ipratropium-albuterol, nicotine   Vital Signs    Vitals:   04/16/23 0321 04/16/23 0400 04/16/23 0600 04/16/23 0727  BP:  95/64 98/67   Pulse:  61 60   Resp:  17 19   Temp: (!) 97.5 F (36.4 C)   98 F (36.7 C)  TempSrc: Oral   Oral  SpO2:  99% 98%   Weight:      Height:        Intake/Output Summary (Last 24 hours) at 04/16/2023 0901 Last data filed at 04/16/2023 0816 Gross per 24 hour  Intake 500 ml  Output --  Net 500 ml   Filed Weights   04/15/23 1431  Weight: 83.9 kg    Telemetry    Sinus rhythm- Personally Reviewed  ECG     - Personally Reviewed  Physical Exam     General: Comfortable,  Head: Atraumatic, normal size  Eyes: PEERLA, EOMI  Neck: Supple, normal JVD Cardiac: Normal S1, S2; RRR; no murmurs, rubs, or gallops Lungs: Clear to auscultation bilaterally Abd: Soft, nontender, no hepatomegaly  Ext: warm, no edema Musculoskeletal: No deformities, BUE and BLE strength normal and equal Skin: Warm and dry, no rashes   Neuro: Alert and oriented to person, place, time, and situation, CNII-XII grossly intact, no focal deficits  Psych: Normal mood and affect   Labs    Chemistry Recent Labs  Lab  04/15/23 1437 04/15/23 1447 04/16/23 0122  NA 137 138 136  K 4.9 4.6 4.0  CL 105  --  104  CO2 18*  --  20*  GLUCOSE 155*  --  98  BUN 20  --  19  CREATININE 1.77*  --  1.49*  CALCIUM 9.3  --  8.9  PROT 8.4*  --   --   ALBUMIN 3.4*  --   --   AST 38  --   --   ALT 25  --   --   ALKPHOS 160*  --   --   BILITOT 1.0  --   --   GFRNONAA 41*  --  51*  ANIONGAP 14  --  12     Hematology Recent Labs  Lab 04/15/23 1437 04/15/23 1447 04/16/23 0122  WBC 8.0  --  5.9  RBC 5.58  --  4.72  HGB 14.9 16.7 12.5*  HCT 47.5 49.0 39.5  MCV 85.1  --  83.7  MCH 26.7  --  26.5  MCHC 31.4  --  31.6  RDW 17.6*  --  17.3*  PLT 165  --  126*    Cardiac EnzymesNo results for input(s): "  TROPONINI" in the last 168 hours. No results for input(s): "TROPIPOC" in the last 168 hours.   BNP Recent Labs  Lab 04/15/23 1437  BNP 66.0     DDimer No results for input(s): "DDIMER" in the last 168 hours.   Radiology    DG Chest Port 1 View  Result Date: 04/15/2023 CLINICAL DATA:  sob.  Respiratory distress. EXAM: PORTABLE CHEST 1 VIEW COMPARISON:  03/10/2023. FINDINGS: There are diffuse heterogeneous alveolar opacities throughout bilateral lungs, favoring multilobar pneumonia. Follow-up to clearing is recommended. No pneumothorax. Bilateral lateral costophrenic angles are clear. Stable cardio-mediastinal silhouette. There is a left sided 3-lead pacemaker. No acute osseous abnormalities. The soft tissues are within normal limits. IMPRESSION: Multilobar pneumonia.  Follow-up to clearing is recommended. Electronically Signed   By: Jules Schick M.D.   On: 04/15/2023 16:23    Cardiac Studies   Reviewed echocardiogram from July 2024  Patient Profile     67 y.o. male history of dilated cardiomyopathy EF 20 to 20% status post [employer] BiV ICD, CAD, diabetes mellitus, hypertension, hyperlipidemia, VT prior PEA arrest  Assessment & Plan    Elevated troponin CAD-stable no anginal symptoms Heart  failure with reduced ejection fraction does not appear to be volume overloaded  Clinically he does not appear to be volume overloaded no evidence of angina at this time there is no need for any ischemic evaluation.  His device was interrogated earlier.  Will sign off at this time.      For questions or updates, please contact [facility] Please consult [email or web address] for contact info under Cardiology/STEMI.      Signed, Sandar Krinke, DO  04/16/2023, 9:01 AM

## 2023-04-16 NOTE — Assessment & Plan Note (Signed)
While no formal PFTs seem to have been collected, patient may have COPD given smoking tobacco history and chronic symptoms.  Patient's cough and breathing appear to be improving at this time with a decrease in sputum production. - Duonebs PRN and Breo - Prednisone 40mg  x 5 days - Wean O2 as tolerated with goal 88-92% - PFTs to be collected outpatient, f/u with PCP

## 2023-04-16 NOTE — Evaluation (Signed)
Occupational Therapy Evaluation Patient Details Name: Edward Mullen MRN: [id number] DOB: February 16, 1956 Today's Date: 04/16/2023   History of Present Illness Patient is a 67 year old male presenting with dyspnea, pneumonia. History of chronic systolic heart failure, nonsustained ventricular tachycardia, stage IIIa CKD, CAD, polysubstance use disorder.   Clinical Impression   Edward Mullen was evaluated s/p the above admission list. He is generally mod I at baseline and lives with family to assist as needed. Upon evaluation, pt demonstrated mod I ability to complete mobility and ADLs. Pt tolerated the session on RA and did not have SOB. Pt does not require further acute, or follow up OT services. Recommend discharge back to pt's environment with assist as needed. OT to sign off with appreciation of order, please re-consult if needed.         If plan is discharge home, recommend the following: Assist for transportation    Functional Status Assessment  Patient has had a recent decline in their functional status and demonstrates the ability to make significant improvements in function in a reasonable and predictable amount of time.  Equipment Recommendations  None recommended by OT       Precautions / Restrictions Precautions Precautions: Fall Restrictions Weight Bearing Restrictions: No      Mobility Bed Mobility Overal bed mobility: Modified Independent                  Transfers Overall transfer level: Modified independent                        Balance Overall balance assessment: No apparent balance deficits (not formally assessed)                     ADL either performed or assessed with clinical judgement   ADL Overall ADL's : Modified independent;At baseline               General ADL Comments: likely at baseline, tolerates on RA     Vision Baseline Vision/History: 0 No visual deficits Vision Assessment?: No apparent visual deficits      Perception Perception: Within Functional Limits       Praxis Praxis: WFL       Pertinent Vitals/Pain Pain Assessment Pain Assessment: No/denies pain     Extremity/Trunk Assessment Upper Extremity Assessment Upper Extremity Assessment: Overall WFL for tasks assessed   Lower Extremity Assessment Lower Extremity Assessment: Overall WFL for tasks assessed   Cervical / Trunk Assessment Cervical / Trunk Assessment: Normal   Communication Communication Communication: No apparent difficulties   Cognition Arousal: Alert Behavior During Therapy: WFL for tasks assessed/performed Overall Cognitive Status: Within Functional Limits for tasks assessed                   General Comments: verbose, easily redirected     General Comments  VSS on RA, pt reports be feels much better            Home Living Family/patient expects to be discharged to:: Private residence Living Arrangements: Children;Other relatives Available Help at Discharge: Family Type of Home: Apartment Home Access: Level entry     Home Layout: One level     Bathroom Shower/Tub: Chief Strategy Officer: Handicapped height     Home Equipment: Agricultural consultant (2 wheels);Cane - single point   Additional Comments: getting home health PT per his report      Prior Functioning/Environment Prior Level of Function : Independent/Modified Independent  Mobility Comments: using cane or rolling walker for ambulation ADLs Comments: dtr assists as needed, family cooks for him        OT Problem List: Decreased activity tolerance         OT Goals(Current goals can be found in the care plan section) Acute Rehab OT Goals Patient Stated Goal: home soon OT Goal Formulation: With patient Time For Goal Achievement: 04/16/23 Potential to Achieve Goals: Good   AM-PAC OT "6 Clicks" Daily Activity     Outcome Measure Help from another person eating meals?: None Help from another  person taking care of personal grooming?: None Help from another person toileting, which includes using toliet, bedpan, or urinal?: None Help from another person bathing (including washing, rinsing, drying)?: None Help from another person to put on and taking off regular upper body clothing?: None Help from another person to put on and taking off regular lower body clothing?: None 6 Click Score: 24   End of Session Equipment Utilized During Treatment: Gait belt Nurse Communication: Mobility status  Activity Tolerance: Patient tolerated treatment well Patient left: in bed;with call bell/phone within reach;with bed alarm set  OT Visit Diagnosis: Unsteadiness on feet (R26.81)                Time: 1610-9604 OT Time Calculation (min): 17 min Charges:  OT General Charges $OT Visit: 1 Visit OT Evaluation $OT Eval Low Complexity: 1 Low  Derenda Mis, OTR/L Acute Rehabilitation Services Office [phone] Secure Chat Communication Preferred   Donia Pounds 04/16/2023, 4:55 PM

## 2023-04-16 NOTE — Evaluation (Addendum)
Physical Therapy Evaluation and Discharge Patient Details Name: Edward Mullen MRN: [id number] DOB: 1956/04/27 Today's Date: 04/16/2023  History of Present Illness  Patient is a 67 year old male presenting with dyspnea, pneumonia. History of chronic systolic heart failure, nonsustained ventricular tachycardia, stage IIIa CKD, CAD, polysubstance use disorder.   Clinical Impression  Patient is agreeable to PT evaluation. He is eager to go home. He reports he lives with his family and uses a cane or rolling walker at baseline. He reports getting home health PT currently.  The patient is likely at his baseline level of functional independence today. He is Mod I with all activity. Hallway ambulation performed with rolling walker with no dyspnea noted. Sp02 98-100% on room air with ambulation. No apparent acute PT needs are demonstrated at this time.       If plan is discharge home, recommend the following: Assist for transportation   Can travel by private vehicle        Equipment Recommendations None recommended by PT  Recommendations for Other Services       Functional Status Assessment Patient has not had a recent decline in their functional status     Precautions / Restrictions Precautions Precautions: Fall Restrictions Weight Bearing Restrictions: No      Mobility  Bed Mobility Overal bed mobility: Modified Independent                  Transfers Overall transfer level: Modified independent                      Ambulation/Gait Ambulation/Gait assistance: Modified independent (Device/Increase time) Gait Distance (Feet): 250 Feet Assistive device: Rolling walker (2 wheels) Gait Pattern/deviations: Step-through pattern Gait velocity: decreased     General Gait Details: no loss of balance with ambulation. no dyspnea noted with exertion. Sp02 98-100% on room air with ambulation.  Stairs            Wheelchair Mobility     Tilt Bed     Modified Rankin (Stroke Patients Only)       Balance                                             Pertinent Vitals/Pain Pain Assessment Pain Assessment: No/denies pain    Home Living Family/patient expects to be discharged to:: Private residence Living Arrangements: Children;Other relatives Available Help at Discharge: Family Type of Home: Apartment Home Access: Level entry       Home Layout: One level Home Equipment: Agricultural consultant (2 wheels);Cane - single point Additional Comments: getting home health PT per his report    Prior Function Prior Level of Function : Independent/Modified Independent             Mobility Comments: using cane or rolling walker for ambulation       Extremity/Trunk Assessment   Upper Extremity Assessment Upper Extremity Assessment: Overall WFL for tasks assessed    Lower Extremity Assessment Lower Extremity Assessment: Overall WFL for tasks assessed       Communication   Communication Communication: No apparent difficulties  Cognition Arousal: Alert Behavior During Therapy: WFL for tasks assessed/performed Overall Cognitive Status: Within Functional Limits for tasks assessed  General Comments General comments (skin integrity, edema, etc.): patient educated on breathing techniques if ever feeling shortness of breath with activity. patient on 2 L 02 on arrival to room. Sp02 99% at rest on room air initially and 98-100% while ambulating on room air. after returning to bed, Sp02 97-99% on room air. oxygen was left off and nurse alerted    Exercises     Assessment/Plan    PT Assessment All further PT needs can be met in the next venue of care  PT Problem List         PT Treatment Interventions      PT Goals (Current goals can be found in the Care Plan section)  Acute Rehab PT Goals PT Goal Formulation: All assessment and education complete, DC  therapy    Frequency       Co-evaluation               AM-PAC PT "6 Clicks" Mobility  Outcome Measure Help needed turning from your back to your side while in a flat bed without using bedrails?: None Help needed moving from lying on your back to sitting on the side of a flat bed without using bedrails?: None Help needed moving to and from a bed to a chair (including a wheelchair)?: None Help needed standing up from a chair using your arms (e.g., wheelchair or bedside chair)?: None Help needed to walk in hospital room?: None Help needed climbing 3-5 steps with a railing? : None 6 Click Score: 24    End of Session Equipment Utilized During Treatment: Gait belt Activity Tolerance: Patient tolerated treatment well Patient left: in bed;with call bell/phone within reach;with bed alarm set Nurse Communication: Mobility status (oxygen saturations with mobility on room air) PT Visit Diagnosis: Muscle weakness (generalized) (M62.81)    Time: 1431-1450 PT Time Calculation (min) (ACUTE ONLY): 19 min   Charges:   PT Evaluation $PT Eval Low Complexity: 1 Low   PT General Charges $$ ACUTE PT VISIT: 1 Visit         Donna Bernard, PT, MPT   Ina Homes 04/16/2023, 3:13 PM

## 2023-04-16 NOTE — Assessment & Plan Note (Signed)
Appears well-controlled at this time with last A1c 5.5 about 1 month ago.  - CBG checks q4 - Holding home Jardiance per cardiology clinic visit (8/14)

## 2023-04-16 NOTE — Assessment & Plan Note (Addendum)
Multifocal pneumonia shown on chest x-ray, treating for CAP at this time. BCx NG<24hr.  - Ceftriaxone and doxycycline for CAP coverage. Avoid azithromycin given prolonged QTc - Duonebs PRN and Breo  - Prednisone 40mg  x 5 days - Pulmonary toilet, incentive spirometry  - AM CBC, CMP

## 2023-04-16 NOTE — Progress Notes (Cosign Needed Addendum)
Daily Progress Note Intern Pager: [phone]  Patient name: Edward Mullen Medical record number: [id number] Date of birth: July 03, 1956 Age: 67 y.o. Gender: male  Primary Care Provider: Loura Back, NP Consultants: Cardiology Code Status: Full code  Pt Overview and Major Events to Date:  9/3: Admitted   Assessment and Plan:  Edward Mullen is a 67 year old male with pmh significant for HFrEF (~20% in 2023), COPD, nonsustained Vtach with ICD, CKD3, CAD admitted for SOB found to have multilobar pneumonia now on ceftriaxone treatment and respiratory support with possible underlying COPD exacerbation as well. Since his admission, patient has been symptomatically improving and weaning oxygen support.  Assessment & Plan Pneumonia of both lungs due to infectious organism, unspecified part of lung Multifocal pneumonia shown on chest x-ray, treating for CAP at this time. BCx NG<24hr.  - Ceftriaxone and doxycycline for CAP coverage. Avoid azithromycin given prolonged QTc - Duonebs PRN and Breo  - Prednisone 40mg  x 5 days - Pulmonary toilet, incentive spirometry  - AM CBC, CMP  Chronic obstructive pulmonary disease, unspecified COPD type (HCC) While no formal PFTs seem to have been collected, patient may have COPD given smoking tobacco history and chronic symptoms.  Patient's cough and breathing appear to be improving at this time with a decrease in sputum production. - Duonebs PRN and Breo - Prednisone 40mg  x 5 days - Wean O2 as tolerated with goal 88-92% - PFTs to be collected outpatient, f/u with PCP Type 2 diabetes mellitus with stage 3b chronic kidney disease, without long-term current use of insulin (HCC) Appears well-controlled at this time with last A1c 5.5 about 1 month ago.  - CBG checks q4 - Holding home Jardiance per cardiology clinic visit (8/14)   Chronic and Stable Problems:  -Hx of NSVT/SVT: continue home amiodarone 200 mg daily, mexiletine 150 mg BID -Systolic heart  failure with EF <20%: continue home torsemide 20 mg, Coreg 3.125 mg BID  -Holding home [place] and Cleda Daub per cardiology clinic visit (8/14)  -HLD: continue Crestor 5 daily -Neuritis: continue home gabapentin 200 TID -Nicotine: Nicoderm PRN   FEN/GI: Cardiac Diet  VTE Prophylaxis: Lovenox   Subjective:  Patient reports feeling better this morning.  His cough has lessened, and his breathing has improved. No CP, HA, VC. Eating and drinking well.   Objective: Temp:  [96.4 F (35.8 C)-98.4 F (36.9 C)] 98 F (36.7 C) (09/04 0727) Pulse Rate:  [59-106] 64 (09/04 0900) Resp:  [17-27] 19 (09/04 0900) BP: (93-123)/(64-82) 123/82 (09/04 0900) SpO2:  [92 %-100 %] 100 % (09/04 0900) FiO2 (%):  [40 %] 40 % (09/03 1433) Weight:  [83.9 kg] 83.9 kg (09/03 1431) Physical Exam: General: Well-appearing, resting comfortably in room.  Cardiovascular: Decreased heart sounds overall. No S3/S4 noted. Warm and well-perfused. Euvolemic on exam.  Respiratory: Breathing comfortably on 2LNC. Bibasilar crackles. No increased WOB. Abdomen: Soft, non-tender, non-distended. Extremities: Warm, dry.   Laboratory: Most recent CBC Lab Results  Component Value Date   WBC 5.9 04/16/2023   HGB 12.5 (L) 04/16/2023   HCT 39.5 04/16/2023   MCV 83.7 04/16/2023   PLT 126 (L) 04/16/2023   Most recent BMP    Latest Ref Rng & Units 04/16/2023    1:22 AM  BMP  Glucose 70 - 99 mg/dL 98   BUN 8 - 23 mg/dL 19   Creatinine 4.03 - 1.24 mg/dL 4.74   Sodium 259 - 563 mmol/L 136   Potassium 3.5 - 5.1 mmol/L 4.0  Chloride 98 - 111 mmol/L 104   CO2 22 - 32 mmol/L 20   Calcium 8.9 - 10.3 mg/dL 8.9     Other pertinent labs: Troponin (04/15/23): 24 > 30 > 42. No need to further trend.  Imaging/Diagnostic Tests: EKG (04/15/23): No ischemic changes. Qtc 557. Chest XR (04/15/23): Multilobar pneumonia.  Follow-up to clearing is recommended. ICD in place.  Edward Quale, MD 04/16/2023, 9:32 AM  PGY-1, [facility] Family  Medicine FPTS Intern pager: [phone], text pages welcome Secure chat group [facility] Teaching Service

## 2023-04-16 NOTE — ED Notes (Signed)
Meal tray ordered for pt per request

## 2023-04-16 NOTE — Plan of Care (Signed)

## 2023-04-17 ENCOUNTER — Other Ambulatory Visit (HOSPITAL_COMMUNITY): Payer: Self-pay

## 2023-04-17 ENCOUNTER — Ambulatory Visit (INDEPENDENT_AMBULATORY_CARE_PROVIDER_SITE_OTHER): Payer: Medicare HMO

## 2023-04-17 DIAGNOSIS — I428 Other cardiomyopathies: Secondary | ICD-10-CM

## 2023-04-17 DIAGNOSIS — J9601 Acute respiratory failure with hypoxia: Secondary | ICD-10-CM

## 2023-04-17 LAB — BASIC METABOLIC PANEL
Anion gap: 10 (ref 5–15)
BUN: 27 mg/dL — ABNORMAL HIGH (ref 8–23)
CO2: 24 mmol/L (ref 22–32)
Calcium: 9.6 mg/dL (ref 8.9–10.3)
Chloride: 102 mmol/L (ref 98–111)
Creatinine, Ser: 1.6 mg/dL — ABNORMAL HIGH (ref 0.61–1.24)
GFR, Estimated: 47 mL/min — ABNORMAL LOW (ref >60)
Glucose, Bld: 95 mg/dL (ref 70–99)
Potassium: 4.1 mmol/L (ref 3.5–5.1)
Sodium: 136 mmol/L (ref 135–145)

## 2023-04-17 LAB — CBC
HCT: 41.4 % (ref 39.0–52.0)
Hemoglobin: 13.3 g/dL (ref 13.0–17.0)
MCH: 26.4 pg (ref 26.0–34.0)
MCHC: 32.1 g/dL (ref 30.0–36.0)
MCV: 82.1 fL (ref 80.0–100.0)
Platelets: 134 10*3/uL — ABNORMAL LOW (ref 150–400)
RBC: 5.04 MIL/uL (ref 4.22–5.81)
RDW: 16.9 % — ABNORMAL HIGH (ref 11.5–15.5)
WBC: 6.5 10*3/uL (ref 4.0–10.5)
nRBC: 0 % (ref 0.0–0.2)

## 2023-04-17 LAB — GLUCOSE, CAPILLARY
Glucose-Capillary: 104 mg/dL — ABNORMAL HIGH (ref 70–99)
Glucose-Capillary: 104 mg/dL — ABNORMAL HIGH (ref 70–99)
Glucose-Capillary: 115 mg/dL — ABNORMAL HIGH (ref 70–99)
Glucose-Capillary: 98 mg/dL (ref 70–99)

## 2023-04-17 MED ORDER — AMOXICILLIN 500 MG PO CAPS: 1000.0000 mg | ORAL_CAPSULE | Freq: Three times a day (TID) | ORAL | Status: DC

## 2023-04-17 MED ORDER — DOXYCYCLINE HYCLATE 100 MG PO TABS: 100.0000 mg | ORAL_TABLET | Freq: Two times a day (BID) | ORAL | Status: DC

## 2023-04-17 MED ORDER — PREDNISONE 20 MG PO TABS
40.0000 mg | ORAL_TABLET | Freq: Every day | ORAL | 0 refills | Status: AC
  Filled 2023-04-17: qty 6, 3d supply, fill #0

## 2023-04-17 MED ORDER — GABAPENTIN 400 MG PO CAPS: 400.0000 mg | ORAL_CAPSULE | Freq: Two times a day (BID) | ORAL | Status: DC

## 2023-04-17 MED ORDER — AMOXICILLIN 500 MG PO CAPS
1000.0000 mg | ORAL_CAPSULE | Freq: Three times a day (TID) | ORAL | 0 refills | Status: AC
  Filled 2023-04-17: qty 18, 3d supply, fill #0

## 2023-04-17 MED ORDER — DOXYCYCLINE HYCLATE 100 MG PO TABS
100.0000 mg | ORAL_TABLET | Freq: Two times a day (BID) | ORAL | 0 refills | Status: AC
  Filled 2023-04-17: qty 6, 3d supply, fill #0

## 2023-04-17 NOTE — Discharge Instructions (Signed)
Dear Edward Mullen,   Thank you so much for allowing [place] to be part of your care!  You were admitted to [facility] for pneumonia and COPD   We will continue with amoxicillin and doxycycline for the next three days    Continue with your steroid until you run out   Continue with all heart medications until you see your cardiologist.   POST-HOSPITAL & CARE INSTRUCTIONS Follow up with your pcp in 1 week Follow up with cardiology at [facility], this is very important for your care.  Please let PCP/Specialists know of any changes that were made.  Please see medications section of this packet for any medication changes.   DOCTOR'S APPOINTMENT & FOLLOW UP CARE INSTRUCTIONS  Future Appointments  Date Time Provider Department Center  04/23/2023 10:00 AM [facility] PHARMACY [facility] None  05/21/2023 10:00 AM [facility] PA/NP [facility] None  07/17/2023  7:05 AM [facility] DEVICE REMOTES [facility]  07/25/2023 10:00 AM Laurey Morale, MD [facility] None  10/16/2023  7:05 AM [facility] DEVICE REMOTES [facility]  01/15/2024  7:05 AM [facility] DEVICE REMOTES [facility]    RETURN PRECAUTIONS: -Shortness of breath  -Chest pain -Swelling  -Confusion  -Passing out   Take care and be well!  Family Medicine Teaching Service  [place]  [facility]  [address] [place], [place] [address] [phone]

## 2023-04-17 NOTE — Progress Notes (Signed)
Mobility Specialist Progress Note:    04/17/23 1057  Mobility  Activity Ambulated with assistance in hallway  Level of Assistance Contact guard assist, steadying assist  Assistive Device Other (Comment) (IV Pole)  Distance Ambulated (ft) 200 ft  Activity Response Tolerated well  Mobility Referral Yes  $Mobility charge 1 Mobility  Mobility Specialist Start Time (ACUTE ONLY) 1030  Mobility Specialist Stop Time (ACUTE ONLY) 1045  Mobility Specialist Time Calculation (min) (ACUTE ONLY) 15 min   Received pt in bed having no complaints and agreeable to mobility. Pt was asymptomatic throughout ambulation and returned to room w/o fault. Pt needed a contact guard during ambulation d/t a slight unsteadiness. Held onto the IV Pole for support but states he has a cane at home. Left in chair w/ call bell in reach and all needs met.   Thompson Grayer Mobility Specialist  Please contact vis Secure Chat or  Rehab Office [phone]

## 2023-04-17 NOTE — Care Management (Signed)
  Transition of Care [facility]) Screening Note   Patient Details  Name: DEFORREST HICKINGBOTTOM Date of Birth: Nov 19, 1955   [facility]) CM/SW Contact:    Lockie Pares, RN Phone Number: 04/17/2023, 12:20 PM    Transition of Care Department [facility]) has reviewed patient and no TOC needs have been identified at this time. We will continue to monitor patient advancement through interdisciplinary progression rounds. If new patient transition needs arise, please place a TOC consult.

## 2023-04-17 NOTE — Discharge Summary (Signed)
Family Medicine Teaching [facility] Discharge Summary  Patient name: Edward Mullen Medical record number: [id number] Date of birth: February 05, 1956 Age: 67 y.o. Gender: male Date of Admission: 04/15/2023  Date of Discharge: 04/17/23 Admitting Physician: Westley Chandler, MD  Primary Care Provider: Loura Back, NP Consultants: Cardiology   Indication for Hospitalization: Dyspnea, Respiratory failure   Discharge Diagnoses/Problem List:  Principal Problem for Admission: PNA, COPD Exacerbation  Other Problems addressed during stay:  Principal Problem:   Acute hypoxemic respiratory failure due to Multifocal Pneumonia Active Problems:   Tobacco abuse   Polysubstance abuse (HCC)   Type 2 diabetes mellitus with stage 3b chronic kidney disease, without long-term current use of insulin (HCC)   COPD (chronic obstructive pulmonary disease) [facility])    Brief Hospital Course:  Edward Mullen is a 67 y.o.male with a history of  [employer] BiV ICD, CAD, T2DM, HTN, HLD, VT, prior PEA arrest, chronic LBBB, CKD3 and polysubstance abuse who was admitted to the [facility] Medicine Teaching Service at [facility] for multilobar pneumonia and COPD exacerbation. His hospital course is detailed below:  Multilobar PNA Patient presented to the ED with SOB and worsening cough over the past three weeks with yellow sputum and color change from baseline. On presentation via EMS, patient was hypoxic in the 50s and was placed on CPAP and transitioned to BIPAP then eventually weaned to 3L [place] after Duoneb in the ED. CXR demonstrated multilobar pneumonia. Started antibiotics with Ceftriaxone and Doxycycline s/p a dose of Ceftriaxone and Doxy in the ED (9/3). Found to have prolonged Qtc and wide QRS on EKG so Azithromycin was avoided, likely due to mild cardial injury secondary to infection. Weaned off oxygen on evening of 9/4. Transitioned to PO doxycycline and amoxicillin to be started on the morning of 9/6.   COPD History of  suspected COPD with no FEV1 found in chart. Not confirmed with PFT studies. Possible GOLD C-D at baseline. Treated with Breo, DuoNebs, and prednisone (40 mg tablet X 5 days) for concern of exacerbation.  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 Differed all medication adjustment to cardiology  HLD: Crestor 5 mg daily T2DM: Last A1c 5.5 1 month ago; Monitored with CBG. Tobacco Use: Discussed nicotine patches for smoking cessation Polysubstance use: last used cocaine 2 weeks ago, encouraged cessation   PCP Follow-up Recommendations: Pulmonology follow-up for PFT testing Recheck BMP, CBC on follow up Encourage cessation of illicit drugs Close follow up with cardiology   Disposition: Home, has HHPT already established   Discharge Condition: Stable   Discharge Exam:  Vitals:   04/17/23 1122 04/17/23 1200  BP: 110/74 110/74  Pulse: 60 60  Resp: 18 18  Temp: 97.8 F (36.6 C) 97.8 F (36.6 C)  SpO2: 100% 100%   Physical Exam performed by student Dr. Tonye Pearson: General: Alert and oriented. In no apparent distress. Ill appearing at baseline Cardiovascular: Regular rate and rhythm. Warm and well-perfused Respiratory: Lungs clear to auscultation bilaterally. No increased work of breathing. On room air with no increased WOB. Minimal bibasilar rales and wheezing noted. Abdomen: Soft, non-distended Extremities: Warm, well perfused. No pitting edema noted  Significant Procedures: None   Significant Labs and Imaging:  Recent Labs  Lab 04/15/23 1437 04/15/23 1447 04/16/23 0122 04/17/23 0256  WBC 8.0  --  5.9 6.5  HGB 14.9 16.7 12.5* 13.3  HCT 47.5 49.0 39.5 41.4  PLT 165  --  126* 134*    Recent Labs  Lab 04/15/23 1437 04/15/23 1447 04/16/23 0122 04/17/23 0256  NA 137 138 136 136  K 4.9 4.6 4.0 4.1  CL 105  --  104 102  CO2 18*  --  20* 24  GLUCOSE 155*  --  98 95  BUN 20  --  19 27*  CREATININE 1.77*  --  1.49* 1.60*  CALCIUM 9.3  --  8.9 9.6  ALKPHOS 160*  --   --   --   AST 38  --   --   --   ALT 25  --   --   --   ALBUMIN 3.4*  --   --   --     DG Chest Port 1 View  Result Date: 04/15/2023 CLINICAL DATA:  sob.  Respiratory distress. EXAM: PORTABLE CHEST 1 VIEW COMPARISON:  03/10/2023. FINDINGS: There are diffuse heterogeneous alveolar opacities throughout bilateral lungs, favoring multilobar pneumonia. Follow-up to clearing is recommended. No pneumothorax. Bilateral lateral costophrenic angles are clear. Stable cardio-mediastinal silhouette. There is a left sided 3-lead pacemaker. No acute osseous abnormalities. The soft tissues are within normal limits. IMPRESSION: Multilobar pneumonia.  Follow-up to clearing is recommended. Electronically Signed   By: Jules Schick M.D.   On: 04/15/2023 16:23     Results/Tests Pending at Time of Discharge: None   Discharge Medications:  Allergies as of 04/17/2023   No Known Allergies      Medication List     STOP taking these medications    tamsulosin 0.4 MG Caps capsule Commonly known as: FLOMAX       TAKE these medications    acetaminophen 500 MG tablet Commonly known as: TYLENOL Take 2 tablets (1,000 mg total) by mouth every 8 (eight) hours as needed.   albuterol (2.5 MG/3ML) 0.083% nebulizer solution Commonly known as: PROVENTIL USE ONE VIAL (2.5 MG TOTAL) BY NEBULIZATION EVERY 6 (SIX) HOURS AS NEEDED FOR SHORTNESS OF BREATH OR WHEEZING.   albuterol 108 (90 Base) MCG/ACT inhaler Commonly known as: VENTOLIN HFA INHALE 1 TO 2 PUFFS BY MOUTH EVERY 6 HOURS AS NEEDED FOR WHEEZING OR SHORTNESS OF BREATH   amiodarone 200 MG tablet Commonly known as: PACERONE Take 1 tablet (200 mg total) by mouth  daily.   amoxicillin 500 MG capsule Commonly known as: AMOXIL Take 2 capsules (1,000 mg total) by mouth every 8 (eight) hours for 3 days. Start taking on: April 18, 2023 Notes to patient: Take for infection    Aspirin Low Dose 81 MG tablet Generic drug: aspirin EC TAKE 1 TABLET (81 MG TOTAL) BY MOUTH DAILY. SWALLOW WHOLE(AM) What changed: See the new instructions.   Breo Ellipta 100-25 MCG/ACT Aepb Generic drug: fluticasone furoate-vilanterol Inhale 1 puff into the lungs daily.   carvedilol 3.125 MG tablet Commonly known as: Coreg Take 1 tablet (3.125 mg total) by mouth 2 (two) times daily.   doxycycline 100 MG tablet Commonly known as: VIBRA-TABS Take 1 tablet (100 mg total) by mouth every 12 (twelve) hours for 3 days. Start taking on: April 18, 2023 Notes to patient: Take to clear up infection   gabapentin 100 MG capsule Commonly known as: Neurontin Take 2 capsules (200 mg total) by mouth 3 (three) times daily. What changed:  how much to  take when to take this   Jardiance 10 MG Tabs tablet Generic drug: empagliflozin Take 10 mg by mouth daily.   mexiletine 150 MG capsule Commonly known as: MEXITIL Take 1 capsule (150 mg total) by mouth 2 (two) times daily.   predniSONE 20 MG tablet Commonly known as: DELTASONE Take 2 tablets (40 mg total) by mouth daily with breakfast for 3 days. Start taking on: April 18, 2023 Notes to patient: Doreatha Martin to improve breathing   rosuvastatin 5 MG tablet Commonly known as: CRESTOR TAKE 1 TABLET (5 MG TOTAL) BY MOUTH DAILY. What changed: how much to take   torsemide 20 MG tablet Commonly known as: DEMADEX Take 1 tablet (20 mg total) by mouth daily.   Vitamin D3 50 MCG (2000 UT) Tabs Take 2,000 Units by mouth daily.        Discharge Instructions: Please refer to Patient Instructions section of EMR for full details.  Patient was counseled important signs and symptoms that should prompt return to medical care, changes  in medications, dietary instructions, activity restrictions, and follow up appointments.   Follow-Up Appointments:  Follow-up Information     Loura Back, NP. Go on 04/21/2023.   Specialty: Nurse Practitioner Why: @9 :40 and pick up time 9:20am Contact information: [address] [place] [address] [phone]                Follow up with cardiology as well.   Alfredo Martinez, MD 04/17/2023, 2:09 PM PGY-3, [facility]

## 2023-04-17 NOTE — Assessment & Plan Note (Addendum)
While no formal PFTs seem to have been collected, patient may have COPD given smoking tobacco history and chronic symptoms. Patient's cough and breathing continuing to improve with noted decrease in sputum production. Currently on on room air. - Duonebs PRN and Breo - PFTs to be collected outpatient, f/u with PCP

## 2023-04-17 NOTE — Progress Notes (Signed)
Mobility Specialist Progress Note:  Nurse requested Mobility Specialist to perform oxygen saturation test with pt which includes removing pt from oxygen both at rest and while ambulating.  Below are the results from that testing.     Patient Saturations on Room Air at Rest = spO2 99%  Patient Saturations on Room Air while Ambulating = sp02 95% .    At end of testing pt left in room on 0  Liters of oxygen.  Reported results to nurse.    Thompson Grayer Mobility Specialist  Please contact vis Secure Chat or  Rehab Office [phone]

## 2023-04-17 NOTE — Progress Notes (Signed)
Patient resting comfortably without any respiratory distress noted.  Bipap ordered for PRN.  Not indicated at this time.  Will continue to monitor.

## 2023-04-17 NOTE — Plan of Care (Signed)

## 2023-04-17 NOTE — Progress Notes (Signed)
Patient transported to discharge lounge via wheelchair by SWOT RN, Olegario Messier at 1440 on 04/17/23.

## 2023-04-17 NOTE — Progress Notes (Addendum)
Daily Progress Note Intern Pager: [phone]  Patient name: Edward Mullen Medical record number: [id number] Date of birth: 1955-09-07 Age: 67 y.o. Gender: male  Primary Care Provider: Loura Back, NP Consultants: Cardiology Code Status: FULL  Pt Overview and Major Events to Date:  9/3: Patient admitted 9/3: IV ceftriaxone 1g and IV doxycycline 100 mg started  Assessment and Plan: Edward Mullen is a 67 y/o male admitted for SOB secondary to multilobar pneumonia confirmed on chest x-ray. Differential diagnosis consists of possible COPD exacerbation, however imaging studies support diagnosis of pneumonia. Patient symptomatically improving with IV ceftriaxone and IV doxycycline 100mg . Patient no longer O2 dependent, weaned from 3L O2 via [place] on admission. Past medical history consists of chronic systolic heart failure (HFrEF w/ EF ~20% in 2023), nonsustained v-tach w/ ICD, CKD3a, COPD, CAD, polysubstance use disorder. Patient is currently stable.  Assessment & Plan Acute hypoxemic respiratory failure due to Multifocal Pneumonia Multifocal pneumonia shown on chest x-ray, continuing to treat for CAP. AM CBC and CMP unremarkable. No increased work of breathing on room air.  - Transitioned to PO amoxicillin 875 BID and doxycycline 1g q8h X 4 days (starting both amoxicillin and doxycycline on morning of 9/6). Discontinued IV Ceftriaxone 1g (3 days) and doxycycline IV 100 mg (3 days). - Monitoring blood cutures, negative at 24 hours.  - Prednisone 40mg  tablet x 5 days - Pulmonary toilet, incentive spirometry  Type 2 diabetes mellitus with stage 3b chronic kidney disease, without long-term current use of insulin (HCC) Currently well-controlled with last A1c 5.5 ~1 month ago.  - CBG checks q4h - Holding home Jardiance per cardiology clinic recommendation (8/14)  COPD (chronic obstructive pulmonary disease) (HCC) While no formal PFTs seem to have been collected, patient may have COPD  given smoking tobacco history and chronic symptoms. Patient's cough and breathing continuing to improve with noted decrease in sputum production. Currently on on room air. - Duonebs PRN and Breo - PFTs to be collected outpatient, f/u with PCP   Chronic, Stable Problems: Tobacco use: Smokes 1-2 cigarettes per week, discussed nicotine patch with patient. Polysubstance use: Last used cocaine 2 weeks ago, encouraged long-term cessation Post-Shingles rash: Increase Gabapentin to 400 BID  FEN/GI: Cardiac diet PPx: Lovenox Dispo:Pending clinical improvement   Subjective:  Patient eating breakfast in bed in no distress. He reports that he has not been on O2 since yesterday, and is not experiencing any chest pain or shortness of breath. He stated that he feels ready to go home. The only pain he reported is on his right back moving in a dermatomal distribution secondary to shingles in April of this year. He states that this pain has been going on for several months. 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  Objective: Temp:  [97.2 F (36.2 C)-98.8 F (37.1 C)] 97.7 F (36.5 C) (09/05 0427) Pulse Rate:  [58-67] 60 (09/05 0427) Resp:  [18-21] 18 (09/05 0427) BP: (112-136)/(66-85) 128/85 (09/05 0427) SpO2:  [94 %-100 %] 99 % (09/05 0427) Weight:  [82.4 kg-83.4 kg] 82.4 kg (09/05 0424)  Physical Exam: General: Alert and oriented. In no apparent distress. Ill appearing at baseline Cardiovascular: Regular rate and rhythm. Warm and well-perfused Respiratory: Lungs clear to auscultation bilaterally. No increased work of breathing. On room air with no increased WOB. Minimal bibasilar rales and wheezing noted. Abdomen: Soft, non-distended Extremities: Warm, well perfused. No pitting  edema noted  Laboratory: Most recent CBC Lab Results  Component Value Date   WBC 6.5 04/17/2023   HGB 13.3 04/17/2023   HCT 41.4  04/17/2023   MCV 82.1 04/17/2023   PLT 134 (L) 04/17/2023   Most recent BMP    Latest Ref Rng & Units 04/17/2023    2:56 AM  BMP  Glucose 70 - 99 mg/dL 95   BUN 8 - 23 mg/dL 27   Creatinine 6.21 - 1.24 mg/dL 3.08   Sodium 657 - 846 mmol/L 136   Potassium 3.5 - 5.1 mmol/L 4.1   Chloride 98 - 111 mmol/L 102   CO2 22 - 32 mmol/L 24   Calcium 8.9 - 10.3 mg/dL 9.6    Other pertinent labs: None Imaging/Diagnostic Tests: None  Dayle Points, Medical Student 04/17/2023, 7:27 AM  Medical Student, [place] [facility] FPTS Intern pager: [phone], text pages welcome Secure chat group [facility] Teaching Service    Resident attestation: I agree with the documentation of Student Dr. Roselyn Bering above. I have made adjustments to her note as appropriate. I have seen the patient and performed physical exam on the patient consistent with her documented physical exam above.  Alfredo Martinez, MD

## 2023-04-17 NOTE — Assessment & Plan Note (Addendum)
Currently well-controlled with last A1c 5.5 ~1 month ago.  - CBG checks q4h - Holding home Jardiance per cardiology clinic recommendation (8/14)

## 2023-04-17 NOTE — Plan of Care (Signed)

## 2023-04-17 NOTE — Assessment & Plan Note (Addendum)
Multifocal pneumonia shown on chest x-ray, continuing to treat for CAP. AM CBC and CMP unremarkable. No increased work of breathing on room air.  - Transitioned to PO amoxicillin 875 BID and doxycycline 1g q8h X 4 days (starting both amoxicillin and doxycycline on morning of 9/6). Discontinued IV Ceftriaxone 1g (3 days) and doxycycline IV 100 mg (3 days). - Monitoring blood cutures, negative at 24 hours.  - Prednisone 40mg  tablet x 5 days - Pulmonary toilet, incentive spirometry

## 2023-04-17 NOTE — Progress Notes (Signed)
Pt IV removed with Discharge information given Still on the monitor due to Advanced HF, Waiting to get in touch with Daughter who is at work.

## 2023-04-18 LAB — CUP PACEART REMOTE DEVICE CHECK
Battery Remaining Longevity: 76 mo
Battery Remaining Percentage: 87 %
Battery Voltage: 3.05 V
Brady Statistic AP VP Percent: 24 %
Brady Statistic AP VS Percent: 1 %
Brady Statistic AS VP Percent: 72 %
Brady Statistic AS VS Percent: 1.6 %
Brady Statistic RA Percent Paced: 23 %
Date Time Interrogation Session: 20240905180254
HighPow Impedance: 74 Ohm
HighPow Impedance: 74 Ohm
Implantable Lead Connection Status: 753985
Implantable Lead Connection Status: 753985
Implantable Lead Connection Status: 753985
Implantable Lead Implant Date: 20231206
Implantable Lead Implant Date: 20231206
Implantable Lead Implant Date: 20231206
Implantable Lead Location: 753858
Implantable Lead Location: 753859
Implantable Lead Location: 753860
Implantable Lead Model: 7122
Implantable Pulse Generator Implant Date: 20231206
Lead Channel Impedance Value: 440 Ohm
Lead Channel Impedance Value: 580 Ohm
Lead Channel Impedance Value: 660 Ohm
Lead Channel Pacing Threshold Amplitude: 0.5 V
Lead Channel Pacing Threshold Amplitude: 0.625 V
Lead Channel Pacing Threshold Amplitude: 0.75 V
Lead Channel Pacing Threshold Pulse Width: 0.5 ms
Lead Channel Pacing Threshold Pulse Width: 0.5 ms
Lead Channel Pacing Threshold Pulse Width: 0.6 ms
Lead Channel Sensing Intrinsic Amplitude: 5 mV
Lead Channel Sensing Intrinsic Amplitude: 6.7 mV
Lead Channel Setting Pacing Amplitude: 1.5 V
Lead Channel Setting Pacing Amplitude: 2 V
Lead Channel Setting Pacing Amplitude: 2 V
Lead Channel Setting Pacing Pulse Width: 0.5 ms
Lead Channel Setting Pacing Pulse Width: 1 ms
Lead Channel Setting Sensing Sensitivity: 0.5 mV
Pulse Gen Serial Number: 5556126
Zone Setting Status: 755011

## 2023-04-20 LAB — CULTURE, BLOOD (ROUTINE X 2)
Culture: NO GROWTH
Culture: NO GROWTH
Special Requests: ADEQUATE

## 2023-04-23 ENCOUNTER — Inpatient Hospital Stay (HOSPITAL_COMMUNITY): Admission: RE | Admit: 2023-04-23 | Payer: Medicare HMO | Source: Ambulatory Visit

## 2023-04-28 NOTE — Progress Notes (Signed)
Remote ICD transmission.   

## 2023-05-21 ENCOUNTER — Encounter (HOSPITAL_COMMUNITY): Payer: Medicare HMO

## 2023-06-29 ENCOUNTER — Emergency Department (HOSPITAL_COMMUNITY)
Admission: EM | Admit: 2023-06-29 | Discharge: 2023-06-30 | Disposition: A | Discharge status: Home / Self Care | Payer: Medicare HMO | Attending: Emergency Medicine | Admitting: Emergency Medicine

## 2023-06-29 ENCOUNTER — Encounter (HOSPITAL_COMMUNITY): Payer: Self-pay | Admitting: Emergency Medicine

## 2023-06-29 ENCOUNTER — Emergency Department (HOSPITAL_COMMUNITY): Payer: Medicare HMO

## 2023-06-29 DIAGNOSIS — Z79899 Other long term (current) drug therapy: Secondary | ICD-10-CM | POA: Diagnosis not present

## 2023-06-29 DIAGNOSIS — Z7982 Long term (current) use of aspirin: Secondary | ICD-10-CM | POA: Insufficient documentation

## 2023-06-29 DIAGNOSIS — N189 Chronic kidney disease, unspecified: Secondary | ICD-10-CM | POA: Diagnosis not present

## 2023-06-29 DIAGNOSIS — N390 Urinary tract infection, site not specified: Secondary | ICD-10-CM | POA: Insufficient documentation

## 2023-06-29 DIAGNOSIS — I130 Hypertensive heart and chronic kidney disease with heart failure and stage 1 through stage 4 chronic kidney disease, or unspecified chronic kidney disease: Secondary | ICD-10-CM | POA: Diagnosis not present

## 2023-06-29 DIAGNOSIS — I509 Heart failure, unspecified: Secondary | ICD-10-CM | POA: Diagnosis not present

## 2023-06-29 DIAGNOSIS — M5431 Sciatica, right side: Secondary | ICD-10-CM | POA: Diagnosis not present

## 2023-06-29 DIAGNOSIS — M25551 Pain in right hip: Secondary | ICD-10-CM | POA: Diagnosis present

## 2023-06-29 LAB — URINALYSIS, ROUTINE W REFLEX MICROSCOPIC
Bilirubin Urine: NEGATIVE
Glucose, UA: >=500 mg/dL — AB
Ketones, ur: NEGATIVE mg/dL
Nitrite: POSITIVE — AB
Protein, ur: 30 mg/dL — AB
Specific Gravity, Urine: 1.011 (ref 1.005–1.030)
WBC, UA: >50 WBC/hpf (ref 0–5)
pH: 5 (ref 5.0–8.0)

## 2023-06-29 NOTE — ED Triage Notes (Signed)
Pt here from home with c/o right hip pain and some urine discoloration , no trauma noted

## 2023-06-30 ENCOUNTER — Other Ambulatory Visit: Payer: Self-pay

## 2023-06-30 ENCOUNTER — Other Ambulatory Visit: Payer: Self-pay | Admitting: Urology

## 2023-06-30 DIAGNOSIS — M5431 Sciatica, right side: Secondary | ICD-10-CM | POA: Diagnosis not present

## 2023-06-30 MED ORDER — DICLOFENAC SODIUM 1 % EX GEL: 4.0000 g | Freq: Four times a day (QID) | CUTANEOUS | 0 refills | Status: DC

## 2023-06-30 MED ORDER — CEPHALEXIN 500 MG PO CAPS
500.0000 mg | ORAL_CAPSULE | Freq: Four times a day (QID) | ORAL | 0 refills | Status: AC
Start: 2023-06-30 — End: 2023-07-07

## 2023-06-30 MED ORDER — CEPHALEXIN 250 MG PO CAPS
500.0000 mg | ORAL_CAPSULE | Freq: Once | ORAL | Status: AC
  Administered 2023-06-30: 500 mg via ORAL
  Filled 2023-06-30: qty 2

## 2023-06-30 MED ORDER — DICLOFENAC SODIUM 1 % EX GEL
4.0000 g | Freq: Once | CUTANEOUS | Status: AC
  Administered 2023-06-30: 4 g via TOPICAL
  Filled 2023-06-30: qty 100

## 2023-06-30 NOTE — Discharge Instructions (Addendum)
Urine culture sent out today. Please follow up with your PCP for recheck and results. Take antibiotics as prescribed. Follow up with orthopedics for further evaluation of your hip pain. Apply Voltaren as prescribed.

## 2023-06-30 NOTE — ED Provider Notes (Signed)
[place] EMERGENCY DEPARTMENT AT [facility] Provider Note   CSN: [id number] Arrival date & time: 06/29/23  2127     History  Chief Complaint  Patient presents with   Hip Pain    Edward Mullen is a 67 y.o. male.  67 year old male presents with complaint of right hip pain onset 1 week ago, located right buttock area, radiates down posterior/lateral right right thigh, worse with walking, no injuries or falls. Also with concern for a "film" in his urine. States he went to his neurologist and was given antibiotics for a film in the urine but it has returned. Denies dysuria, states he is scheduled for prostate surgery, does not have a foley currently.        Home Medications Prior to Admission medications   Medication Sig Start Date End Date Taking? Authorizing Provider  cephALEXin (KEFLEX) 500 MG capsule Take 1 capsule (500 mg total) by mouth 4 (four) times daily for 7 days. 06/30/23 07/07/23 Yes Jeannie Fend, PA-C  diclofenac Sodium (VOLTAREN ARTHRITIS PAIN) 1 % GEL Apply 4 g topically 4 (four) times daily. 06/30/23  Yes Jeannie Fend, PA-C  acetaminophen (TYLENOL) 500 MG tablet Take 2 tablets (1,000 mg total) by mouth every 8 (eight) hours as needed. 12/16/22   Rocky Morel, DO  albuterol (PROVENTIL) (2.5 MG/3ML) 0.083% nebulizer solution USE ONE VIAL (2.5 MG TOTAL) BY NEBULIZATION EVERY 6 (SIX) HOURS AS NEEDED FOR SHORTNESS OF BREATH OR WHEEZING. 10/29/22   Morene Crocker, MD  albuterol (VENTOLIN HFA) 108 (90 Base) MCG/ACT inhaler INHALE 1 TO 2 PUFFS BY MOUTH EVERY 6 HOURS AS NEEDED FOR WHEEZING OR SHORTNESS OF BREATH 10/29/22   Morene Crocker, MD  amiodarone (PACERONE) 200 MG tablet Take 1 tablet (200 mg total) by mouth daily. 11/26/22   Zannie Cove, MD  ASPIRIN LOW DOSE 81 MG tablet TAKE 1 TABLET (81 MG TOTAL) BY MOUTH DAILY. SWALLOW WHOLE(AM) Patient taking differently: Take 81 mg by mouth daily. 01/27/23   [place], Anderson Malta, FNP  BREO  ELLIPTA 100-25 MCG/ACT AEPB Inhale 1 puff into the lungs daily. 08/02/22 08/02/23  Morene Crocker, MD  carvedilol (COREG) 3.125 MG tablet Take 1 tablet (3.125 mg total) by mouth 2 (two) times daily. 01/29/22 03/25/24  Jacklynn Ganong, FNP  Cholecalciferol (VITAMIN D3) 50 MCG (2000 UT) TABS Take 2,000 Units by mouth daily.    [provider]  gabapentin (NEURONTIN) 100 MG capsule Take 2 capsules (200 mg total) by mouth 3 (three) times daily. Patient taking differently: Take 300 mg by mouth 2 (two) times daily. 12/16/22 03/25/24  Rocky Morel, DO  JARDIANCE 10 MG TABS tablet Take 10 mg by mouth daily. 04/03/23   [provider]  mexiletine (MEXITIL) 150 MG capsule Take 1 capsule (150 mg total) by mouth 2 (two) times daily. 07/31/22   Duke Salvia, MD  rosuvastatin (CRESTOR) 5 MG tablet TAKE 1 TABLET (5 MG TOTAL) BY MOUTH DAILY. Patient taking differently: Take 5 mg by mouth daily. 12/26/20   Laurey Morale, MD  torsemide (DEMADEX) 20 MG tablet Take 1 tablet (20 mg total) by mouth daily. 03/26/23 06/24/23  Alen Bleacher, NP  zolpidem (AMBIEN) 10 MG tablet Take 1 tablet (10 mg total) by mouth at bedtime as needed for sleep. 09/16/22 01/03/23        Allergies    Patient has no known allergies.    Review of Systems   Review of Systems Negative except as per  HPI Physical Exam Updated Vital Signs BP (!) 115/98 (BP Location: Right Arm)   Pulse 70   Temp 97.6 F (36.4 C) (Oral)   Resp 16   SpO2 99%  Physical Exam Vitals and nursing note reviewed.  Constitutional:      General: He is not in acute distress.    Appearance: He is well-developed. He is not diaphoretic.  HENT:     Head: Normocephalic and atraumatic.  Pulmonary:     Effort: Pulmonary effort is normal.  Abdominal:     Palpations: Abdomen is soft.     Tenderness: There is no abdominal tenderness. There is no right CVA tenderness or left CVA tenderness.  Musculoskeletal:        General: Tenderness  present. No swelling or deformity.     Right lower leg: No edema.     Left lower leg: No edema.     Comments: Equal leg strength.  SLR + for pain in right lower back with right leg extension, no pain with left leg extension. Sensation intact, DP pulses present. Right SI TTP. No pain with log roll right hip.   Skin:    General: Skin is warm and dry.     Findings: No erythema or rash.  Neurological:     Mental Status: He is alert and oriented to person, place, and time.  Psychiatric:        Behavior: Behavior normal.     ED Results / Procedures / Treatments   Labs (all labs ordered are listed, but only abnormal results are displayed) Labs Reviewed  URINALYSIS, ROUTINE W REFLEX MICROSCOPIC - Abnormal; Notable for the following components:      Result Value   Color, Urine AMBER (*)    APPearance TURBID (*)    Glucose, UA >=500 (*)    Hgb urine dipstick SMALL (*)    Protein, ur 30 (*)    Nitrite POSITIVE (*)    Leukocytes,Ua LARGE (*)    Bacteria, UA MANY (*)    All other components within normal limits  URINE CULTURE    EKG None  Radiology DG Hip Unilat W or Wo Pelvis 2-3 Views Right  Result Date: 06/29/2023 CLINICAL DATA:  Right hip pain.  No known injury. EXAM: DG HIP (WITH OR WITHOUT PELVIS) 2-3V RIGHT COMPARISON:  None Available. 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 SI joints symmetric. No acute bony abnormality. Specifically, no fracture, subluxation, or dislocation. IMPRESSION: Moderate symmetric degenerative changes in the hips. No acute bony abnormality. Electronically Signed   By: Charlett Nose M.D.   On: 06/29/2023 22:18    Procedures Procedures    Medications Ordered in ED Medications  diclofenac Sodium (VOLTAREN) 1 % topical gel 4 g (4 g Topical Given 06/30/23 0203)  cephALEXin (KEFLEX) capsule 500 mg (500 mg Oral Given 06/30/23 0157)    ED Course/ Medical Decision Making/ A&P                                  Medical Decision Making Amount and/or Complexity of Data Reviewed Labs: ordered. Radiology: ordered.  Risk Prescription drug management.   This patient presents to the ED for concern of right hip pain and film in urine, this involves an extensive number of treatment options, and is a complaint that carries with it a high risk of complications and morbidity.  The differential diagnosis includes but  not limited to OA, sciatica, bursitis, fracture, UTI, bacteruria    Co morbidities that complicate the patient evaluation  HTN, polysubstance abuse, AKI, CHF, CKD   Additional history obtained:  External records from outside source obtained and reviewed including recent labs on file   Lab Tests:  I Ordered, and personally interpreted labs.  The pertinent results include:  UA concerning for UTI, will send cx   Imaging Studies ordered:  I ordered imaging studies including XR right hip  I independently visualized and interpreted imaging which showed arthritis, negative for fx, dislocation  I agree with the radiologist interpretation   Problem List / ED Course / Critical interventions / Medication management  67 year old male presents with complaint of pain in his right buttock area that radiates down his right lower leg without fall or injury.  Also notes a film to his urine, recently treated for UTI with unknown antibiotic.  Regarding his hip pain, his x-ray shows arthritis, he does have pain over the lateral aspect of the hip as well as the right SI joint.  Pain is worse with straight leg raise extension on the right only.  Strength and sensation are equal in the legs and he is ambulatory.  Suspect patient may have sciatica versus arthritis causing his pain.  Will trial topical Voltaren and refer to orthopedics for follow-up.  Regarding his urine, he may have a urinary tract infection, is scheduled for prostate surgery.  I reviewed his records, unable to locate what  antibiotic he was recently placed on by his neurologist.  He states that he did complete the antibiotic.  Will start Keflex and send culture.  Chart review without any recent culture results to review and treatment selection. I ordered medication including voltaren gel  for right hip pain, keflex for UTI I have reviewed the patients home medicines and have made adjustments as needed   Social Determinants of Health:  Has PCP, stable for discharge   Test / Admission - Considered:  Stable for dc with plan to recheck with PCP and follow up with ortho         Final Clinical Impression(s) / ED Diagnoses Final diagnoses:  Sciatica of right side  Urinary tract infection in male    Rx / DC Orders ED Discharge Orders          Ordered    diclofenac Sodium (VOLTAREN ARTHRITIS PAIN) 1 % GEL  4 times daily        06/30/23 0151    cephALEXin (KEFLEX) 500 MG capsule  4 times daily        06/30/23 0153              Jeannie Fend, PA-C 06/30/23 Grayland Ormond, MD 06/30/23 [phone]

## 2023-07-02 LAB — URINE CULTURE: Culture: >=100000 — AB

## 2023-07-03 ENCOUNTER — Telehealth (HOSPITAL_BASED_OUTPATIENT_CLINIC_OR_DEPARTMENT_OTHER): Payer: Self-pay | Admitting: *Deleted

## 2023-07-03 ENCOUNTER — Telehealth (HOSPITAL_COMMUNITY): Payer: Self-pay

## 2023-07-03 NOTE — Telephone Encounter (Signed)
  [facility]   Pre-operative Risk Assessment   HEARTCARE STAFF-IMPORTANT INSTRUCTIONS 1 Red and Blue Text will auto delete once note is signed or closed. 2 Press F2 to navigate through template.   3 On drop down lists, L click to select >> R click to activate next field 4 Reason for Visit format is IMPORTANT!!  See Directions on No. 2 below. 5 Please review chart to determine if there is already a clearance note open for this procedure!!  DO NOT duplicate if a note already exists!!    :1}      Request for Surgical Clearance    Procedure:   Aquablation of the prostate  Date of Surgery:  Clearance 07/29/23                                 Surgeon:  Dr. Mena Goes Surgeon's Group or Practice Name:  [facility]  Phone number:  [phone] Fax number:  [phone]   Type of Clearance Requested:   - Medical  - Pharmacy:  Hold Aspirin hold 5 days prior to procedure   Type of Anesthesia:  Not Indicated   Additional requests/questions:  Please fax a copy of surgical clearance to the surgeon's office.  Signed, Linda Hedges   07/03/2023, 2:55 PM   [facility]  [facility] [address] Heart and Vascular [place] [address] [phone] (office) [phone] (fax)

## 2023-07-03 NOTE — Telephone Encounter (Signed)
Post ED Visit - Positive Culture Follow-up  Culture report reviewed by antimicrobial stewardship pharmacist: [facility] [x]  Lennie Muckle, Pharm.D. []  Celedonio Miyamoto, Pharm.D., BCPS AQ-ID []  Garvin Fila, Pharm.D., BCPS []  Georgina Pillion, [address].D., BCPS []  [place], [address].D., BCPS, AAHIVP []  Estella Husk, Pharm.D., BCPS, AAHIVP []  Lysle Pearl, PharmD, BCPS []  Phillips Climes, PharmD, BCPS []  Agapito Games, PharmD, BCPS []  Verlan Friends, PharmD []  Mervyn Gay, PharmD, BCPS []  Vinnie Level, PharmD  [facility] []  Len Childs, PharmD []  Greer Pickerel, PharmD []  Adalberto Cole, PharmD []  Perlie Gold, Rph []  Lonell Face) Jean Rosenthal, PharmD []  Earl Many, PharmD []  Junita Push, PharmD []  Dorna Leitz, PharmD []  Terrilee Files, PharmD []  Lynann Beaver, PharmD []  Keturah Barre, PharmD []  Loralee Pacas, PharmD []  Bernadene Person, PharmD   Positive urine culture Treated with Cephalexin, organism sensitive to the same and no further patient follow-up is required at this time.  Virl Axe [facility] 07/03/2023, 10:22 AM

## 2023-07-04 NOTE — Telephone Encounter (Signed)
Surgical clearance sent via Epic

## 2023-07-07 ENCOUNTER — Emergency Department (HOSPITAL_COMMUNITY)
Admission: EM | Admit: 2023-07-07 | Discharge: 2023-07-07 | Disposition: A | Discharge status: Home / Self Care | Payer: Medicare HMO | Attending: Emergency Medicine | Admitting: Emergency Medicine

## 2023-07-07 ENCOUNTER — Encounter (HOSPITAL_COMMUNITY): Payer: Self-pay

## 2023-07-07 ENCOUNTER — Other Ambulatory Visit: Payer: Self-pay

## 2023-07-07 ENCOUNTER — Emergency Department (HOSPITAL_COMMUNITY): Payer: Medicare HMO

## 2023-07-07 DIAGNOSIS — I509 Heart failure, unspecified: Secondary | ICD-10-CM | POA: Insufficient documentation

## 2023-07-07 DIAGNOSIS — I110 Hypertensive heart disease with heart failure: Secondary | ICD-10-CM | POA: Diagnosis not present

## 2023-07-07 DIAGNOSIS — Z7951 Long term (current) use of inhaled steroids: Secondary | ICD-10-CM | POA: Insufficient documentation

## 2023-07-07 DIAGNOSIS — R1031 Right lower quadrant pain: Secondary | ICD-10-CM | POA: Insufficient documentation

## 2023-07-07 DIAGNOSIS — Z7982 Long term (current) use of aspirin: Secondary | ICD-10-CM | POA: Insufficient documentation

## 2023-07-07 DIAGNOSIS — J449 Chronic obstructive pulmonary disease, unspecified: Secondary | ICD-10-CM | POA: Insufficient documentation

## 2023-07-07 DIAGNOSIS — Z79899 Other long term (current) drug therapy: Secondary | ICD-10-CM | POA: Diagnosis not present

## 2023-07-07 LAB — URINALYSIS, W/ REFLEX TO CULTURE (INFECTION SUSPECTED)
Bacteria, UA: NONE SEEN
Bilirubin Urine: NEGATIVE
Glucose, UA: 50 mg/dL — AB
Ketones, ur: NEGATIVE mg/dL
Leukocytes,Ua: NEGATIVE
Nitrite: NEGATIVE
Protein, ur: 30 mg/dL — AB
Specific Gravity, Urine: 1.01 (ref 1.005–1.030)
pH: 7 (ref 5.0–8.0)

## 2023-07-07 LAB — BASIC METABOLIC PANEL
Anion gap: 10 (ref 5–15)
BUN: 18 mg/dL (ref 8–23)
CO2: 25 mmol/L (ref 22–32)
Calcium: 11.1 mg/dL — ABNORMAL HIGH (ref 8.9–10.3)
Chloride: 107 mmol/L (ref 98–111)
Creatinine, Ser: 1.71 mg/dL — ABNORMAL HIGH (ref 0.61–1.24)
GFR, Estimated: 43 mL/min — ABNORMAL LOW (ref >60)
Glucose, Bld: 82 mg/dL (ref 70–99)
Potassium: 4.1 mmol/L (ref 3.5–5.1)
Sodium: 142 mmol/L (ref 135–145)

## 2023-07-07 LAB — CBC WITH DIFFERENTIAL/PLATELET
Abs Immature Granulocytes: 0.01 10*3/uL (ref 0.00–0.07)
Basophils Absolute: 0 10*3/uL (ref 0.0–0.1)
Basophils Relative: 1 %
Eosinophils Absolute: 0 10*3/uL (ref 0.0–0.5)
Eosinophils Relative: 0 %
HCT: 41.6 % (ref 39.0–52.0)
Hemoglobin: 13.3 g/dL (ref 13.0–17.0)
Immature Granulocytes: 0 %
Lymphocytes Relative: 29 %
Lymphs Abs: 1 10*3/uL (ref 0.7–4.0)
MCH: 27.3 pg (ref 26.0–34.0)
MCHC: 32 g/dL (ref 30.0–36.0)
MCV: 85.2 fL (ref 80.0–100.0)
Monocytes Absolute: 1.3 10*3/uL — ABNORMAL HIGH (ref 0.1–1.0)
Monocytes Relative: 37 %
Neutro Abs: 1.2 10*3/uL — ABNORMAL LOW (ref 1.7–7.7)
Neutrophils Relative %: 33 %
Platelets: 109 10*3/uL — ABNORMAL LOW (ref 150–400)
RBC: 4.88 MIL/uL (ref 4.22–5.81)
RDW: 15.8 % — ABNORMAL HIGH (ref 11.5–15.5)
WBC: 3.6 10*3/uL — ABNORMAL LOW (ref 4.0–10.5)
nRBC: 0 % (ref 0.0–0.2)

## 2023-07-07 LAB — CBG MONITORING, ED: Glucose-Capillary: 82 mg/dL (ref 70–99)

## 2023-07-07 MED ORDER — HYDROMORPHONE HCL 1 MG/ML IJ SOLN
1.0000 mg | Freq: Once | INTRAMUSCULAR | Status: AC
  Administered 2023-07-07: 1 mg via INTRAVENOUS
  Filled 2023-07-07: qty 1

## 2023-07-07 MED ORDER — MORPHINE SULFATE (PF) 4 MG/ML IV SOLN
4.0000 mg | Freq: Once | INTRAVENOUS | Status: AC
  Administered 2023-07-07: 4 mg via INTRAVENOUS
  Filled 2023-07-07: qty 1

## 2023-07-07 MED ORDER — IOHEXOL 350 MG/ML SOLN
60.0000 mL | Freq: Once | INTRAVENOUS | Status: AC | PRN
  Administered 2023-07-07: 60 mL via INTRAVENOUS

## 2023-07-07 NOTE — Discharge Instructions (Addendum)
You have been seen and discharged from the emergency department. CT imaging shows no abnormality in the right groin or abdomen. You pain is possibly related to sciatica.   Follow-up with your primary provider for further evaluation and further care. Take home medications as prescribed. If you have any worsening symptoms or further concerns for your health please return to an emergency department for further evaluation.

## 2023-07-07 NOTE — ED Provider Notes (Signed)
[place] EMERGENCY DEPARTMENT AT [facility] Provider Note   CSN: [id number] Arrival date & time: 07/07/23  1227     History  Chief Complaint  Patient presents with   Hip Pain    Edward Mullen is a 67 y.o. male.  Patient is a 67 year old male with a past medical history of CHF, COPD, hypertension presenting to the emergency department with right groin pain.  Patient states that he has had on and off pain in this right groin for few weeks and was seen in the ED about a week ago and was diagnosed with sciatica and a UTI.  He states that his pain gotten better and when he woke up this morning the pain was worse.  He states that it is worse with any type of movement and feels like he has a swelling in his groin.  He states that he has been somewhat constipated and also has been having difficulty initiating his urine stream.  He states that he is scheduled to have a TURP next month with urology for BPH.  He denies any nausea or vomiting.  He denies any testicle pain or swelling.  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  The history is provided by the patient.  Hip Pain       Home Medications Prior to Admission medications   Medication Sig Start Date End Date Taking? Authorizing Provider  acetaminophen (TYLENOL) 500 MG tablet Take 2 tablets (1,000 mg total) by mouth every 8 (eight) hours as needed. 12/16/22   Rocky Morel, DO  albuterol (PROVENTIL) (2.5 MG/3ML) 0.083% nebulizer solution USE ONE VIAL (2.5 MG TOTAL) BY NEBULIZATION EVERY 6 (SIX) HOURS AS NEEDED FOR SHORTNESS OF BREATH OR WHEEZING. 10/29/22   Morene Crocker, MD  albuterol (VENTOLIN HFA) 108 (90 Base) MCG/ACT inhaler INHALE 1 TO 2 PUFFS BY MOUTH EVERY 6 HOURS AS NEEDED FOR WHEEZING OR SHORTNESS OF BREATH 10/29/22   Morene Crocker, MD  amiodarone (PACERONE) 200 MG tablet Take 1 tablet (200 mg total) by mouth daily. 11/26/22   Zannie Cove, MD  ASPIRIN LOW DOSE 81 MG tablet TAKE 1 TABLET  (81 MG TOTAL) BY MOUTH DAILY. SWALLOW WHOLE(AM) Patient taking differently: Take 81 mg by mouth daily. 01/27/23   [place], Anderson Malta, FNP  BREO ELLIPTA 100-25 MCG/ACT AEPB Inhale 1 puff into the lungs daily. 08/02/22 08/02/23  Morene Crocker, MD  carvedilol (COREG) 3.125 MG tablet Take 1 tablet (3.125 mg total) by mouth 2 (two) times daily. 01/29/22 03/25/24  Jacklynn Ganong, FNP  cephALEXin (KEFLEX) 500 MG capsule Take 1 capsule (500 mg total) by mouth 4 (four) times daily for 7 days. 06/30/23 07/07/23  Jeannie Fend, PA-C  Cholecalciferol (VITAMIN D3) 50 MCG (2000 UT) TABS Take 2,000 Units by mouth daily.    [provider]  diclofenac Sodium (VOLTAREN ARTHRITIS PAIN) 1 % GEL Apply 4 g topically 4 (four) times daily. 06/30/23   Jeannie Fend, PA-C  gabapentin (NEURONTIN) 100 MG capsule Take 2 capsules (200 mg total) by mouth 3 (three) times daily. Patient taking differently: Take 300 mg by mouth 2 (two) times daily. 12/16/22 03/25/24  Rocky Morel, DO  JARDIANCE 10 MG TABS tablet Take 10 mg by mouth daily. 04/03/23   [provider]  mexiletine (MEXITIL) 150 MG capsule Take 1 capsule (150 mg total) by mouth 2 (two) times daily. 07/31/22   Duke Salvia, MD  rosuvastatin (CRESTOR) 5 MG tablet TAKE 1 TABLET (5  MG TOTAL) BY MOUTH DAILY. Patient taking differently: Take 5 mg by mouth daily. 12/26/20   Laurey Morale, MD  torsemide (DEMADEX) 20 MG tablet Take 1 tablet (20 mg total) by mouth daily. 03/26/23 06/24/23  Alen Bleacher, NP  zolpidem (AMBIEN) 10 MG tablet Take 1 tablet (10 mg total) by mouth at bedtime as needed for sleep. 09/16/22 01/03/23        Allergies    Patient has no known allergies.    Review of Systems   Review of Systems  Physical Exam Updated Vital Signs BP 134/86 (BP Location: Left Arm)   Pulse 61   Temp 97.6 F (36.4 C) (Oral)   Resp 18   Ht 6' (1.829 m)   Wt 83.9 kg   SpO2 97%   BMI 25.09 kg/m  Physical Exam Vitals and nursing note  reviewed.  Constitutional:      General: He is not in acute distress.    Appearance: Normal appearance.  HENT:     Head: Normocephalic and atraumatic.     Nose: Nose normal.     Mouth/Throat:     Mouth: Mucous membranes are moist.     Pharynx: Oropharynx is clear.  Eyes:     Extraocular Movements: Extraocular movements intact.     Conjunctiva/sclera: Conjunctivae normal.  Cardiovascular:     Rate and Rhythm: Normal rate and regular rhythm.     Heart sounds: Normal heart sounds.  Pulmonary:     Effort: Pulmonary effort is normal.     Breath sounds: Normal breath sounds.  Abdominal:     General: Abdomen is flat.     Palpations: Abdomen is soft.     Tenderness: There is abdominal tenderness (RLQ/R groin). There is no guarding or rebound.     Hernia: No hernia (No palpable inguinal hernia on exam) is present.  Musculoskeletal:        General: Normal range of motion.     Cervical back: Normal range of motion.     Comments: Point tenderness to R ASIS, no pain with R hip internal/external rotation  Skin:    General: Skin is warm and dry.  Neurological:     General: No focal deficit present.     Mental Status: He is alert and oriented to person, place, and time.  Psychiatric:        Mood and Affect: Mood normal.        Behavior: Behavior normal.     ED Results / Procedures / Treatments   Labs (all labs ordered are listed, but only abnormal results are displayed) Labs Reviewed  CBC WITH DIFFERENTIAL/PLATELET - Abnormal; Notable for the following components:      Result Value   WBC 3.6 (*)    RDW 15.8 (*)    Platelets 109 (*)    Neutro Abs 1.2 (*)    Monocytes Absolute 1.3 (*)    All other components within normal limits  BASIC METABOLIC PANEL  URINALYSIS, W/ REFLEX TO CULTURE (INFECTION SUSPECTED)    EKG None  Radiology No results found.  Procedures Procedures    Medications Ordered in ED Medications  morphine (PF) 4 MG/ML injection 4 mg (4 mg Intravenous  Given 07/07/23 1512)    ED Course/ Medical Decision Making/ A&P Clinical Course as of 07/07/23 1556  Mon Jul 07, 2023  1555 Patient signed out to Dr. Wilkie Aye pending labs and CT. [VK]    Clinical Course User Index [VK] Rexford Maus, DO  Medical Decision Making This patient presents to the ED with chief complaint(s) of R groin pain with pertinent past medical history of CHF, HTN, COPD, BPH which further complicates the presenting complaint. The complaint involves an extensive differential diagnosis and also carries with it a high risk of complications and morbidity.    The differential diagnosis includes muscle strain or spasm, hernia, appendicitis or other intra-abdominal infection   Additional history obtained: Additional history obtained from N/A Records reviewed recent ED records  ED Course and Reassessment: Patient's arrival he is hemodynamically stable in no acute distress.  He is point tender in his right groin and over his right ASIS.  Did have x-ray of his right hip that showed degenerative changes but no bony abnormality on last ED visit.  He has no palpable inguinal hernia however will have CT of his abdomen pelvis to evaluate for hernia, appendicitis or other intra-abdominal etiology.  He will have repeat urine performed with his recent urinary symptoms and was given morphine for pain control and will be closely reassessed.  Independent labs interpretation:  - Pending  Independent visualization of imaging: - Pending     Amount and/or Complexity of Data Reviewed Labs: ordered. Radiology: ordered.  Risk Prescription drug management.          Final Clinical Impression(s) / ED Diagnoses Final diagnoses:  None    Rx / DC Orders ED Discharge Orders     None         Rexford Maus, DO 07/07/23 1556

## 2023-07-07 NOTE — ED Provider Notes (Signed)
Patient signed out to me by previous provider. Please refer to their note for full HPI.  Briefly this is a 67 year old male who presented to the emergency department complaining of right hip/groin pain.  Blood work is reassuring we are pending CT of the abdomen pelvis to rule out any acute groin findings.  CT of the abdomen pelvis is unremarkable, especially in regards to the described groin pain.  Will plan for outpatient follow-up.  Patient at this time appears safe and stable for discharge and close outpatient follow up. Discharge plan and strict return to ED precautions discussed, patient verbalizes understanding and agreement.   Rozelle Logan, DO 07/07/23 2113

## 2023-07-07 NOTE — ED Notes (Signed)
Patient transported to CT 

## 2023-07-07 NOTE — ED Triage Notes (Addendum)
C/o R hip, buttocks,and groin pain x 2-3 weeks; scheduled for prostate surgery in December; seen for same 11/22, suspected sciatica; pt says pain has worsened since then; endorses difficulty urinating; denies fevers; taking tylenol and using heating pads at home without relief; denies new injury

## 2023-07-17 ENCOUNTER — Ambulatory Visit (INDEPENDENT_AMBULATORY_CARE_PROVIDER_SITE_OTHER): Payer: Medicare Other

## 2023-07-17 DIAGNOSIS — I428 Other cardiomyopathies: Secondary | ICD-10-CM | POA: Diagnosis not present

## 2023-07-20 LAB — CUP PACEART REMOTE DEVICE CHECK
Battery Remaining Longevity: 73 mo
Battery Remaining Percentage: 84 %
Battery Voltage: 3.02 V
Brady Statistic AP VP Percent: 27 %
Brady Statistic AP VS Percent: 1 %
Brady Statistic AS VP Percent: 70 %
Brady Statistic AS VS Percent: 1.3 %
Brady Statistic RA Percent Paced: 26 %
Date Time Interrogation Session: 20241205141549
HighPow Impedance: 72 Ohm
HighPow Impedance: 72 Ohm
Lead Channel Impedance Value: 440 Ohm
Lead Channel Impedance Value: 460 Ohm
Lead Channel Impedance Value: 630 Ohm
Lead Channel Pacing Threshold Amplitude: 0.5 V
Lead Channel Pacing Threshold Amplitude: 0.75 V
Lead Channel Pacing Threshold Amplitude: 0.75 V
Lead Channel Pacing Threshold Pulse Width: 0.5 ms
Lead Channel Pacing Threshold Pulse Width: 0.5 ms
Lead Channel Pacing Threshold Pulse Width: 0.6 ms
Lead Channel Sensing Intrinsic Amplitude: 12 mV
Lead Channel Sensing Intrinsic Amplitude: 5 mV
Lead Channel Setting Pacing Amplitude: 1.5 V
Lead Channel Setting Pacing Amplitude: 2 V
Lead Channel Setting Pacing Amplitude: 2 V
Lead Channel Setting Pacing Pulse Width: 0.5 ms
Lead Channel Setting Pacing Pulse Width: 1 ms
Lead Channel Setting Sensing Sensitivity: 0.5 mV
Pulse Gen Serial Number: 5556126
Zone Setting Status: 755011

## 2023-07-21 ENCOUNTER — Encounter (HOSPITAL_COMMUNITY): Admission: RE | Admit: 2023-07-21 | Payer: Medicare HMO | Source: Ambulatory Visit

## 2023-07-25 ENCOUNTER — Encounter (HOSPITAL_COMMUNITY): Payer: Self-pay | Admitting: Cardiology

## 2023-07-25 ENCOUNTER — Telehealth (HOSPITAL_COMMUNITY): Payer: Self-pay

## 2023-07-25 ENCOUNTER — Ambulatory Visit (HOSPITAL_COMMUNITY)
Admission: RE | Admit: 2023-07-25 | Discharge: 2023-07-25 | Disposition: A | Discharge status: Home / Self Care | Payer: Medicare HMO | Source: Ambulatory Visit | Attending: Cardiology | Admitting: Cardiology

## 2023-07-25 VITALS — BP 104/60 | HR 74 | Wt 192.2 lb

## 2023-07-25 DIAGNOSIS — I428 Other cardiomyopathies: Secondary | ICD-10-CM | POA: Insufficient documentation

## 2023-07-25 DIAGNOSIS — E119 Type 2 diabetes mellitus without complications: Secondary | ICD-10-CM | POA: Insufficient documentation

## 2023-07-25 DIAGNOSIS — I5022 Chronic systolic (congestive) heart failure: Secondary | ICD-10-CM | POA: Diagnosis not present

## 2023-07-25 DIAGNOSIS — Z7982 Long term (current) use of aspirin: Secondary | ICD-10-CM | POA: Insufficient documentation

## 2023-07-25 DIAGNOSIS — I471 Supraventricular tachycardia, unspecified: Secondary | ICD-10-CM | POA: Diagnosis not present

## 2023-07-25 DIAGNOSIS — R569 Unspecified convulsions: Secondary | ICD-10-CM | POA: Diagnosis not present

## 2023-07-25 DIAGNOSIS — F1411 Cocaine abuse, in remission: Secondary | ICD-10-CM | POA: Insufficient documentation

## 2023-07-25 DIAGNOSIS — I110 Hypertensive heart disease with heart failure: Secondary | ICD-10-CM | POA: Diagnosis present

## 2023-07-25 DIAGNOSIS — E782 Mixed hyperlipidemia: Secondary | ICD-10-CM | POA: Diagnosis not present

## 2023-07-25 DIAGNOSIS — J449 Chronic obstructive pulmonary disease, unspecified: Secondary | ICD-10-CM | POA: Insufficient documentation

## 2023-07-25 DIAGNOSIS — I2510 Atherosclerotic heart disease of native coronary artery without angina pectoris: Secondary | ICD-10-CM | POA: Insufficient documentation

## 2023-07-25 DIAGNOSIS — I130 Hypertensive heart and chronic kidney disease with heart failure and stage 1 through stage 4 chronic kidney disease, or unspecified chronic kidney disease: Secondary | ICD-10-CM | POA: Diagnosis not present

## 2023-07-25 DIAGNOSIS — N183 Chronic kidney disease, stage 3 unspecified: Secondary | ICD-10-CM | POA: Insufficient documentation

## 2023-07-25 DIAGNOSIS — Z95810 Presence of automatic (implantable) cardiac defibrillator: Secondary | ICD-10-CM | POA: Insufficient documentation

## 2023-07-25 LAB — BASIC METABOLIC PANEL
Anion gap: 8 (ref 5–15)
BUN: 16 mg/dL (ref 8–23)
CO2: 26 mmol/L (ref 22–32)
Calcium: 10 mg/dL (ref 8.9–10.3)
Chloride: 106 mmol/L (ref 98–111)
Creatinine, Ser: 1.51 mg/dL — ABNORMAL HIGH (ref 0.61–1.24)
GFR, Estimated: 50 mL/min — ABNORMAL LOW (ref >60)
Glucose, Bld: 87 mg/dL (ref 70–99)
Potassium: 3.9 mmol/L (ref 3.5–5.1)
Sodium: 140 mmol/L (ref 135–145)

## 2023-07-25 LAB — LIPID PANEL
Cholesterol: 160 mg/dL (ref 0–200)
HDL: 35 mg/dL — ABNORMAL LOW (ref >40)
LDL Cholesterol: 106 mg/dL — ABNORMAL HIGH (ref 0–99)
Total CHOL/HDL Ratio: 4.6 {ratio}
Triglycerides: 95 mg/dL (ref <150)
VLDL: 19 mg/dL (ref 0–40)

## 2023-07-25 LAB — BRAIN NATRIURETIC PEPTIDE: B Natriuretic Peptide: 1055.4 pg/mL — ABNORMAL HIGH (ref 0.0–100.0)

## 2023-07-25 MED ORDER — SPIRONOLACTONE 25 MG PO TABS: 12.5000 mg | ORAL_TABLET | Freq: Every day | ORAL | 3 refills | Status: DC

## 2023-07-25 MED ORDER — TORSEMIDE 20 MG PO TABS: ORAL_TABLET | ORAL | 3 refills | Status: DC

## 2023-07-25 MED ORDER — ROSUVASTATIN CALCIUM 20 MG PO TABS: 20.0000 mg | ORAL_TABLET | Freq: Every day | ORAL | 3 refills | Status: DC

## 2023-07-25 NOTE — Patient Instructions (Signed)
Medication Changes:  Increase Torsemide to 1 tab daily every other day ALTERNATING with 2 tabs daily every other day   START Spironolactone 12.5 mg (1/2 tab) Daily  Lab Work:  Labs done today, your results will be available in MyChart, we will contact you for abnormal readings.  Your physician recommends that you return for lab work in: 1-2 weeks  Testing/Procedures:  Your physician has recommended that you have a pulmonary function test. Pulmonary Function Tests are a group of tests that measure how well air moves in and out of your lungs. You will be called to schedule this  Referrals:  none  Special Instructions // Education:  Do the following things EVERYDAY: Weigh yourself in the morning before breakfast. Write it down and keep it in a log. Take your medicines as prescribed Eat low salt foods--Limit salt (sodium) to 2000 mg per day.  Stay as active as you can everyday Limit all fluids for the day to less than 2 liters   Follow-Up in:   Please follow up with our heart failure pharmacist in 3 weeks  Your physician recommends that you schedule a follow-up appointment in: 6 wks   At the [facility], you and your health needs are our priority. We have a designated team specialized in the treatment of Heart Failure. This Care Team includes your primary Heart Failure Specialized Cardiologist (physician), Advanced Practice Providers (APPs- Physician Assistants and Nurse Practitioners), and Pharmacist who all work together to provide you with the care you need, when you need it.   You may see any of the following providers on your designated Care Team at your next follow up:  Dr. Arvilla Meres Dr. Marca Ancona Dr. Dorthula Nettles Dr. Theresia Bough Tonye Becket, NP Robbie Lis, [place] [facility] Logan, [place] Brynda Peon, NP [place] Lee, NP Karle Plumber, PharmD   Please be sure to bring in all your medications bottles to every appointment.    Need to Contact us:  If you have any questions or concerns before your next appointment please send [place] a message through [place] or call our office at [phone].    TO LEAVE A MESSAGE FOR THE NURSE SELECT OPTION 2, PLEASE LEAVE A MESSAGE INCLUDING: YOUR NAME DATE OF BIRTH CALL BACK NUMBER REASON FOR CALL**this is important as we prioritize the call backs  YOU WILL RECEIVE A CALL BACK THE SAME DAY AS LONG AS YOU CALL BEFORE 4:00 PM

## 2023-07-25 NOTE — Telephone Encounter (Signed)
-----   Message from [employer] sent at 07/25/2023  2:26 PM EST ----- Goal LDL < 55, increase Crestor to 20 mg daily with lipids/LFTs in 2 months.

## 2023-07-25 NOTE — Telephone Encounter (Signed)
Spoke with patient regarding the following results. Patient made aware and patient verbalized understanding.   New prescription for Rosuvastatin 20mg  sent to patient preferred pharmacy.   Labs ordered and scheduled.   Advised patient to call back to office with any issues, questions, or concerns. Patient verbalized understanding.

## 2023-07-26 NOTE — Progress Notes (Signed)
Advanced Heart Failure Clinic Note   Primary Care: Loura Back, NP HF Cardiologist: Dr. Shirlee Latch   HPI: Edward Mullen is a 67 y.o. male with history of HTN, DM, chronic systolic CHF, and polysubstance abuse.   Admitted 08/23/16 with acute systolic CHF in setting of substance abuse. Echo was done, showing EF 15-20%.  Diuresed with IV lasix and meds adjusted as tolerated.  UDS + for cocaine on admission. Cardiac cath showed coronary disease but not significant enough to explain cardiomyopathy. Down 23 lbs from admission weight with diuresis.  Discharge weight 193 lbs.  He was admitted with syncope in 5/18 after using cocaine.  He was noted to have NSVT on telemetry.  Syncope suspected due to VT, no ICD given active substance abuse but had Lifevest placed. EF 50% on 8/18 echo, so Lifevest subsequently removed.  He started using cocaine again and stopped his cardiac medications. In 4/22, he was admitted with acute on chronic systolic CHF.  Frequent NSVT was noted and amiodarone was started.  He had echo showing EF 20-25% with normal RV.  LHC showed 80% PLV stenosis, managed medically.   Admitted 3/23 with a/c CHF and PNA. Echo with EF 20-25%, RV mildly reduced. Started on milrinone and IV lasix. RHC showed mildly elevated PCWP and LVEDP, CI 1.9. Milrinone weaned off and GDMT titrated. Had NSVT/VT, discharged home with LifeVest, weight 202 lbs.  Echo 8/23 showed EF 25-30%, global hypokinesis, moderate LV dilation, mild LVH, mildly decreased RV systolic function. Referred to EP for CRT-D consideration.  S/p St Jude CRT-D 12/23. .  Admitted 4/24 with OOH arrest, found unresponsive in his car. Required 8 mins CPR. Suspected cocaine OD.  UDS + for benzos, THC, and cocaine. Developed cardiogenic shock postcardiac arrest. Required intubation and pressors in CCU. + for rhinovirus during admission. Pressors weaned off. Discharged home on coreg, mexilitine, amiodarone, and torsemide. Losartan and  spironolactone held with hyperkalemia.   Admitted 7/24 with lightheadedness, hypotension and syncope. GDMT initially held but was all restarted at discharge. Suspected dehydration/hypovolemia. Had acute urinary retention and right-sided epididymitis, discharged with foley. Torsemide was decreased from 20 mg daily to 10 mg 3x/week. Discharge weight 181 lbs.   He was again admitted in 9/24 with PNA.   He returns today for followup of CHF.  He has been using a walker for balance.  No dyspnea walking around his house or walking into the office today.  No chest pain.  No orthopnea/PND.  Has post-herpetic neuralgia pain from prior shingles in right flank.  No lightheadedness or palpitations. No orthopnea/PND.  Last positive urine drug screen for cocaine was in 4/24.   St Jude device interrogation: 96% BiV pacing, 26% a-pacing, no VT, recent decrease in thoracic impedance.   ROS: All systems reviewed and negative except as per HPI.   Social History: Lives in [place] with daughter. Prior heavy ETOH, now quit.  Quit smoking 10/20. Has quit using cocaine per his report since 4/24.  Active marijuana.   Family History  Problem Relation Age of Onset   Stroke Mother 58   Heart disease Father 51   Colon cancer Neg Hx    Colon polyps Neg Hx    Esophageal cancer Neg Hx    Stomach cancer Neg Hx    Rectal cancer Neg Hx    Past Medical History: 1. HTN  2. Type II diabetes 3. Cocaine abuse 4. COPD: Prior smoker.  5. Cardiomyopathy: Nonischemic. St Jude CRT-D device.  - Echo (1/18) with  EF 15-20%, moderate LVH, moderate AI, moderate to severe MR, normal RV size with mildly decreased systolic function, PASP 44 mmHg.  - LHC/RHC (1/18): 80% stenosis in PLV branch.  Mean RA 5, PA 47/20 mean 32, mean PCWP 22, CI 2.01.  - Echo (8/18): EF 50% with moderate LVH, diffuse hypokinesis, normal RV size and systolic function, mild AI, trivial MR.  - Echo (4/22): EF 20-25%, severe LV dilation, normal RV. - Echo  (2/23):  EF 20-25%, RV mildly reduced.  - Echo (8/23): EF 25-30%, global hypokinesis, moderate LV dilation, mild LVH, mildly decreased RV systolic function.  6. CAD: LHC (1/18) with 80% stenosis in a branch of the PLV (not enough CAD to explain cardiomyopathy).  - LHC (4/22): PLV 80%, 25% mLAD.  - R/LHC (2/23): 1st RPL 80%, 30% mLAD; mildly elevated PCWP and LVEDP, normal RA pressures, CI 1.9 - Echo 10/23 EF 20-25% RV moderately reduced.  - Echo 4/24 EF25-30%, normal RV.  - Echo 7/24 EF <20%, GIDD, RV mildly reduced systolic function, mild MR.  7. Mitral regurgitation: Moderate to severe on 1/18 echo, probably functional.  Trivial MR on 8/18 echo.  8. CKD: Stage 3.  9. Syncope: 5/18 in setting of cocaine use, concern for VT.  10. Lower extremity arterial dopplers (7/18): No significant PAD.  11. Sciatica 12. NSVT 13. CKD stage 3  Current Outpatient Medications  Medication Sig Dispense Refill   acetaminophen (TYLENOL) 500 MG tablet Take 2 tablets (1,000 mg total) by mouth every 8 (eight) hours as needed. 100 tablet 0   albuterol (PROVENTIL) (2.5 MG/3ML) 0.083% nebulizer solution USE ONE VIAL (2.5 MG TOTAL) BY NEBULIZATION EVERY 6 (SIX) HOURS AS NEEDED FOR SHORTNESS OF BREATH OR WHEEZING. 75 mL 2   albuterol (VENTOLIN HFA) 108 (90 Base) MCG/ACT inhaler INHALE 1 TO 2 PUFFS BY MOUTH EVERY 6 HOURS AS NEEDED FOR WHEEZING OR SHORTNESS OF BREATH 18 g 2   amiodarone (PACERONE) 200 MG tablet Take 1 tablet (200 mg total) by mouth daily. 30 tablet 0   ASPIRIN LOW DOSE 81 MG tablet TAKE 1 TABLET (81 MG TOTAL) BY MOUTH DAILY. SWALLOW WHOLE(AM) 30 tablet 11   BREO ELLIPTA 100-25 MCG/ACT AEPB Inhale 1 puff into the lungs daily. 30 each 11   carvedilol (COREG) 3.125 MG tablet Take 1 tablet (3.125 mg total) by mouth 2 (two) times daily. 60 tablet 11   Cholecalciferol (VITAMIN D3) 50 MCG (2000 UT) TABS Take 2,000 Units by mouth daily.     diclofenac Sodium (VOLTAREN ARTHRITIS PAIN) 1 % GEL Apply 4 g topically  4 (four) times daily. 100 g 0   gabapentin (NEURONTIN) 100 MG capsule Take 2 capsules (200 mg total) by mouth 3 (three) times daily. 90 capsule 0   JARDIANCE 10 MG TABS tablet Take 10 mg by mouth daily.     mexiletine (MEXITIL) 150 MG capsule Take 1 capsule (150 mg total) by mouth 2 (two) times daily. 180 capsule 3   spironolactone (ALDACTONE) 25 MG tablet Take 0.5 tablets (12.5 mg total) by mouth daily. 15 tablet 3   rosuvastatin (CRESTOR) 20 MG tablet Take 1 tablet (20 mg total) by mouth daily. 90 tablet 3   torsemide (DEMADEX) 20 MG tablet Take 1 tab daily every other day ALTERNATING with 2 tabs daily every other day 45 tablet 3   No current facility-administered medications for this encounter.   No Known Allergies  BP 104/60   Pulse 74   Wt 87.2 kg (192 lb 3.2  oz)   SpO2 95%   BMI 26.07 kg/m   Wt Readings from Last 3 Encounters:  07/25/23 87.2 kg (192 lb 3.2 oz)  07/07/23 83.9 kg (185 lb)  04/17/23 82.4 kg (181 lb 10.5 oz)    PHYSICAL EXAM: General: NAD Neck: JVP 8-9 cm, no thyromegaly or thyroid nodule.  Lungs: Occasional rhonchi CV: Nondisplaced PMI.  Heart regular S1/S2, no S3/S4, no murmur.  Trace ankle edema.  No carotid bruit.  Normal pedal pulses.  Abdomen: Soft, nontender, no hepatosplenomegaly, no distention.  Skin: Intact without lesions or rashes.  Neurologic: Alert and oriented x 3.  Psych: Normal affect. Extremities: No clubbing or cyanosis.  HEENT: Normal.   ASSESSMENT & PLAN: 1. Chronic systolic CHF:  EF 15-20% with moderate to severe MR on echo 1/18.  Nonischemic cardiomyopathy, has St Jude CRT-D device. HTN vs cocaine use vs viral vs ETOH. HIV negative, SPEP with no M-spike.  Patient has been noted to have CAD but not enough to explain cardiomyopathy (Cath 2/23 admission with stable 80% PLV stenosis).  Echo in 8/18 showed EF up to 50%. However, he stopped his meds and started cocaine again, echo in 4/22 with EF back down to 20-25% => suspect cocaine abuse may  be the primary etiology of cardiomyopathy. Echo in 2/23 with EF 20-25%, RV mildly reduced. RHC (3/23) with mildly elevated PCWP and LVEDP, CI 1.9. Echo (8/23) showed EF 25-30%, global hypokinesis, moderate LV dilation, mild LVH, mildly decreased RV systolic function. Echo 10/23 EF 20-25% RV moderately reduced.  Echo 7/24 EF <20%, GIDD, RV mildly reduced.  He says he has not used cocaine since around 4/24. CRT device is BiV pacing appropriately.  NYHA class II symptoms but he is volume overloaded by exam and Corvue, weight is up 4 lbs. Will have to be careful with med titration with CKD and low BP.  - Increase torsemide to 40 mg daily alternating with 20 mg daily. BMET/BNP today and in 10 days.  - Continue carvedilol 3.125 mg bid. - Continue Jardiance 10 mg daily.  - Start spironolactone 12.5 g daily.  2.VT/NSVT: Multiple runs during 3/23 admission, longest was 28 beats. Coronary angiography showed no change in coronaries. Has St Jude CRT-D.  - Continue amiodarone 200 mg daily, follow LFTs and TSH. He should get regular eye exam.  - Continue mexiletine 150 mg BID 3. CKD III: Baseline SCr 1.6. BMET today. 4. CAD: 80% stenosis PLV on cath in 2/23.  No chest pain. - Continue ASA. - Continue statin, check lipids.  6. Polysubstance abuse abuse: He reports no cocaine since 4/24, still smoking marijuana.  7. COPD: I will arrange for PFTs.    Followup in 3 wks with HF pharmacist for med titration, see APP in 6 wks.    Marca Ancona. 07/26/2023

## 2023-07-28 ENCOUNTER — Other Ambulatory Visit: Payer: Self-pay | Admitting: Internal Medicine

## 2023-07-29 ENCOUNTER — Ambulatory Visit (HOSPITAL_COMMUNITY): Admit: 2023-07-29 | Payer: Medicare HMO | Admitting: Urology

## 2023-07-29 SURGERY — TRANSURETHRAL WATERJET ABLATION OF PROSTATE
Anesthesia: General

## 2023-08-04 ENCOUNTER — Ambulatory Visit (HOSPITAL_COMMUNITY)
Admission: RE | Admit: 2023-08-04 | Discharge: 2023-08-04 | Disposition: A | Discharge status: Home / Self Care | Payer: Medicare HMO | Source: Ambulatory Visit | Attending: Cardiology | Admitting: Cardiology

## 2023-08-04 DIAGNOSIS — I5022 Chronic systolic (congestive) heart failure: Secondary | ICD-10-CM | POA: Insufficient documentation

## 2023-08-04 LAB — BASIC METABOLIC PANEL
Anion gap: 10 (ref 5–15)
BUN: 34 mg/dL — ABNORMAL HIGH (ref 8–23)
CO2: 29 mmol/L (ref 22–32)
Calcium: 10.8 mg/dL — ABNORMAL HIGH (ref 8.9–10.3)
Chloride: 98 mmol/L (ref 98–111)
Creatinine, Ser: 2.27 mg/dL — ABNORMAL HIGH (ref 0.61–1.24)
GFR, Estimated: 30 mL/min — ABNORMAL LOW (ref >60)
Glucose, Bld: 89 mg/dL (ref 70–99)
Potassium: 4.3 mmol/L (ref 3.5–5.1)
Sodium: 137 mmol/L (ref 135–145)

## 2023-08-05 NOTE — Progress Notes (Incomplete)
***In Progress***    Advanced Heart Failure Clinic Note   Primary Care: Loura Back, NP HF Cardiologist: Dr. Shirlee Latch   HPI:  Edward Mullen is a 67 y.o. male with history of HTN, DM, chronic systolic CHF, and polysubstance abuse.   Admitted 08/23/16 with acute systolic CHF in setting of substance abuse. Echo was done, showing EF 15-20%.  Diuresed with IV Lasix and meds adjusted as tolerated.  UDS + for cocaine on admission. Cardiac cath showed coronary disease but not significant enough to explain cardiomyopathy. Down 23 lbs from admission weight with diuresis.  Discharge weight 193 lbs.   He was admitted with syncope in 12/2016 after using cocaine.  He was noted to have NSVT on telemetry.  Syncope suspected due to VT, no ICD given active substance abuse but had Lifevest placed. EF 50% on 8/18 echo, so Lifevest subsequently removed.   He started using cocaine again and stopped his cardiac medications. In 11/2020, he was admitted with acute on chronic systolic CHF.  Frequent NSVT was noted and amiodarone was started.  He had echo showing EF 20-25% with normal RV.  LHC showed 80% PLV stenosis, managed medically.    Admitted 10/2021 with a/c CHF and PNA. Echo with EF 20-25%, RV mildly reduced. Started on milrinone and IV Lasix. RHC showed mildly elevated PCWP and LVEDP, CI 1.9. Milrinone weaned off and GDMT titrated. Had NSVT/VT, discharged home with LifeVest, weight 202 lbs.   Echo 03/2022 showed EF 25-30%, global hypokinesis, moderate LV dilation, mild LVH, mildly decreased RV systolic function. Referred to EP for CRT-D consideration.   S/p St Jude CRT-D 07/2022. .  Admitted 11/2022 with OOH arrest, found unresponsive in his car. Required 8 mins CPR. Suspected cocaine OD.  UDS + for benzos, THC, and cocaine. Developed cardiogenic shock postcardiac arrest. Required intubation and pressors in CCU. + for rhinovirus during admission. Pressors weaned off. Discharged home on carvedilol, mexiletine,  amiodarone, and torsemide. Losartan and spironolactone held with hyperkalemia.    Admitted 02/2023 with lightheadedness, hypotension and syncope. GDMT initially held but was all restarted at discharge. Suspected dehydration/hypovolemia. Had acute urinary retention and right-sided epididymitis, discharged with foley. Torsemide was decreased from 20 mg daily to 10 mg 3x/week. Discharge weight 181 lbs.    He was again admitted in 04/2023 with PNA.    He returned 07/25/23 for followup of CHF with Dr. Shirlee Latch.  He had been using a walker for balance.  No dyspnea walking around his house or walking into the office today.  No chest pain.  No orthopnea/PND.  Had post-herpetic neuralgia pain from prior shingles in right flank.  No lightheadedness or palpitations. No orthopnea/PND.  Last positive urine drug screen for cocaine was in 11/2022.   Today he returns to HF clinic for pharmacist medication titration. At last visit with MD  torsemide was increased to 40 mg daily alternating with 20 mg daily and spironolactone 12.5 mg daily was started. Additionally, rosuvastatin was increased to 20 mg daily.    Shortness of breath/dyspnea on exertion? {YES NO:22349}  Orthopnea/PND? {YES NO:22349} Edema? {YES NO:22349} Lightheadedness/dizziness? {YES NO:22349} Daily weights at home? {YES NO:22349} Blood pressure/heart rate monitoring at home? {YES [id number] Following low-sodium/fluid-restricted diet? {YES NO:22349}  HF Medications: Carvedilol 3.125 mg BID Jardiance 10 mg daily Spironolactone 12.5 mg daily Torsemide 40 mg daily alternating with 20 mg daily   Has the patient been experiencing any side effects to the medications prescribed?  {YES NO:22349}  Does the patient have any  problems obtaining medications due to transportation or finances?   {YES NO:22349}  Understanding of regimen: {excellent/good/fair/poor:19665} Understanding of indications: {excellent/good/fair/poor:19665} Potential of compliance:  {excellent/good/fair/poor:19665} Patient understands to avoid NSAIDs. Patient understands to avoid decongestants.    Pertinent Lab Values: Serum creatinine ***, BUN ***, Potassium ***, Sodium ***, BNP ***, Magnesium ***, Digoxin ***   Vital Signs: Weight: *** (last clinic weight: ***) Blood pressure: ***  Heart rate: ***   Assessment/Plan: 1. Chronic systolic CHF:  EF 15-20% with moderate to severe MR on echo 08/2016.  Nonischemic cardiomyopathy, has St Jude CRT-D device. HTN vs cocaine use vs viral vs ETOH. HIV negative, SPEP with no M-spike.  Patient has been noted to have CAD but not enough to explain cardiomyopathy (Cath 09/2021 admission with stable 80% PLV stenosis).  Echo in 03/2017 showed EF up to 50%. However, he stopped his meds and started cocaine again, echo in 11/2020 with EF back down to 20-25% => suspect cocaine abuse may be the primary etiology of cardiomyopathy. Echo in 09/2021 with EF 20-25%, RV mildly reduced. RHC (10/2021) with mildly elevated PCWP and LVEDP, CI 1.9. Echo (03/2022) showed EF 25-30%, global hypokinesis, moderate LV dilation, mild LVH, mildly decreased RV systolic function. Echo 05/2022 EF 20-25% RV moderately reduced.  Echo 02/2023 EF <20%, GIDD, RV mildly reduced.  He says he has not used cocaine since around 11/2022. CRT device is BiV pacing appropriately.   - NYHA class II symptoms but he is volume overloaded by exam and Corvue, weight is up 4 lbs. Will have to be careful with med titration with CKD and low BP. *** - Continue torsemide 40 mg daily alternating with 20 mg daily.  - Continue carvedilol 3.125 mg bid. - Continue spironolactone 12.5 g daily.  - Continue Jardiance 10 mg daily.  2.VT/NSVT: Multiple runs during 10/2021 admission, longest was 28 beats. Coronary angiography showed no change in coronaries. Has St Jude CRT-D.  - Continue amiodarone 200 mg daily, follow LFTs and TSH. He should get regular eye exam.  - Continue mexiletine 150 mg BID 3. CKD III:  Baseline SCr 1.6. BMET today.*** 4. CAD: 80% stenosis PLV on cath in 09/2021.  No chest pain. - Continue ASA. - Continue statin 6. Polysubstance abuse abuse: He reports no cocaine since 4/24, still smoking marijuana.  7. COPD: referred for PFTs last visit.     Follow up ***   Karle Plumber, PharmD, BCPS, BCCP, CPP Heart Failure Clinic Pharmacist [phone]

## 2023-08-07 ENCOUNTER — Other Ambulatory Visit: Payer: Self-pay | Admitting: Urology

## 2023-08-07 DIAGNOSIS — N401 Enlarged prostate with lower urinary tract symptoms: Secondary | ICD-10-CM

## 2023-08-08 ENCOUNTER — Telehealth (HOSPITAL_COMMUNITY): Payer: Self-pay

## 2023-08-08 DIAGNOSIS — I5022 Chronic systolic (congestive) heart failure: Secondary | ICD-10-CM

## 2023-08-08 MED ORDER — TORSEMIDE 20 MG PO TABS: 20.0000 mg | ORAL_TABLET | Freq: Every day | ORAL | 3 refills | Status: DC

## 2023-08-08 NOTE — Telephone Encounter (Signed)
-----   Message from Marca Ancona sent at 08/07/2023 10:56 PM EST ----- Hold torsemide for a day then decrease to 20 mg daily.  BMET 1 week.

## 2023-08-08 NOTE — Telephone Encounter (Signed)
Spoke with patient regarding the following results. Patient made aware and patient verbalized understanding.   Repeat labs ordered and scheduled. Medication list updated.   Advised patient to call back to office with any issues, questions, or concerns. Patient verbalized understanding.

## 2023-08-11 ENCOUNTER — Telehealth (HOSPITAL_COMMUNITY): Payer: Self-pay | Admitting: Vascular Surgery

## 2023-08-11 NOTE — Telephone Encounter (Signed)
Called pt to give pft appt, will try back, pt has no VM

## 2023-08-12 ENCOUNTER — Encounter: Payer: Self-pay | Admitting: Cardiology

## 2023-08-15 ENCOUNTER — Other Ambulatory Visit: Payer: Self-pay

## 2023-08-15 ENCOUNTER — Ambulatory Visit (HOSPITAL_COMMUNITY)
Admission: RE | Admit: 2023-08-15 | Discharge: 2023-08-15 | Disposition: A | Discharge status: Home / Self Care | Payer: Medicare HMO | Source: Ambulatory Visit | Attending: Cardiology | Admitting: Cardiology

## 2023-08-15 ENCOUNTER — Encounter: Payer: Self-pay | Admitting: Physician Assistant

## 2023-08-15 ENCOUNTER — Ambulatory Visit (INDEPENDENT_AMBULATORY_CARE_PROVIDER_SITE_OTHER): Payer: Medicare HMO | Admitting: Physician Assistant

## 2023-08-15 DIAGNOSIS — I5022 Chronic systolic (congestive) heart failure: Secondary | ICD-10-CM | POA: Insufficient documentation

## 2023-08-15 DIAGNOSIS — M25551 Pain in right hip: Secondary | ICD-10-CM

## 2023-08-15 LAB — BASIC METABOLIC PANEL
Anion gap: 7 (ref 5–15)
BUN: 13 mg/dL (ref 8–23)
CO2: 24 mmol/L (ref 22–32)
Calcium: 9.4 mg/dL (ref 8.9–10.3)
Chloride: 109 mmol/L (ref 98–111)
Creatinine, Ser: 1.51 mg/dL — ABNORMAL HIGH (ref 0.61–1.24)
GFR, Estimated: 50 mL/min — ABNORMAL LOW (ref >60)
Glucose, Bld: 93 mg/dL (ref 70–99)
Potassium: 3.8 mmol/L (ref 3.5–5.1)
Sodium: 140 mmol/L (ref 135–145)

## 2023-08-15 NOTE — Progress Notes (Signed)
 Office Visit Note   Patient: Edward Mullen           Date of Birth: 25-Nov-1955           MRN: [id number] Visit Date: 08/15/2023              Requested by: Leontine Cramp, NP [address] [place],  [place] [address] PCP: Leontine Cramp, NP   Assessment & Plan: Visit Diagnoses:  1. Pain in right hip     Plan: Patient is a pleasant 68 year old gentleman with a history of diabetes and chronic kidney disease with a history of right groin pain.  He said he is have this chronically but is gotten worse in the last couple months.  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  He did have x-rays done of his hip in November which showed moderate osteoarthritis of the right hip.  He also has had significant back surgery by another provider 4 years ago.  He has some radicular symptoms but these are stable.  He is more concerned about his right hip and groin pain.  He has difficulty as if he has been sitting a while and is using a cane to help him ambulate.  His diabetes is under good control and he has been compliant with his meds.  He had a BMP done showing a glucose of 93 today.  He is also had A1c's which have been below 7 last hemoglobin A1c was 5.5.  He does not have anti-inflammatories as an option because of his kidney function.  Discussed with him going forward with a steroid injection under ultrasound into his right hip today.  He is in agreement with this.  Follow-Up Instructions: As needed  Orders:  Orders Placed This Encounter  Procedures   US  Guided Needle Placement - No Linked Charges   No orders of the defined types were placed in this encounter.     Procedures: No procedures performed   Clinical Data: No additional findings.   Subjective: Chief Complaint  Patient presents with   Right Hip - Pain    HPI pleasant 68 year old gentleman referred for right groin pain.  Denies any injuries.  He is also being seen by urology for his  prostate.  Also being followed by cardiology for heart disease.  Has a history of diabetes and his A1c is under good control however he does have chronic kidney disease stage III.  Takes Tylenol  for the pain notes the pain hurts in his groin if he has been sitting for a while he uses a cane to help him ambulate  Review of Systems  All other systems reviewed and are negative.    Objective: Vital Signs: There were no vitals taken for this visit.  Physical Exam Constitutional:      Appearance: Normal appearance.  Skin:    General: Skin is warm and dry.  Neurological:     General: No focal deficit present.     Mental Status: He is alert and oriented to person, place, and time.  Psychiatric:        Mood and Affect: Mood normal.        Behavior: Behavior normal.     Ortho Exam Examination of his right hip he is neurovascular intact he has no straight leg raise he has good dorsiflexion plantarflexion of his ankle.  He does have focal pain in the groin which radiates down his leg to  his knee.  Pain is reproduced with logrolling Specialty Comments:  No specialty comments available.  Imaging: No results found.   PMFS History: Patient Active Problem List   Diagnosis Date Noted   Acute hypoxemic respiratory failure due to Multifocal Pneumonia 04/15/2023   Epididymoorchitis 03/12/2023   HFrEF (heart failure with reduced ejection fraction) (HCC) 03/11/2023   Volume depletion 03/11/2023   Hypotension 03/10/2023   Shingles rash 12/05/2022   Herpes zoster dermatitis 12/05/2022   Hyperkalemia 12/05/2022   Chest pain, musculoskeletal 12/05/2022   Increased anion gap metabolic acidosis 12/05/2022   Abnormal echocardiogram 11/24/2022   Malnutrition of moderate degree (HCC) 11/19/2022   Cardiogenic shock (HCC) 11/18/2022   Cardiac arrest (HCC) 11/17/2022   LBBB (left bundle branch block) 10/21/2022   PVC (premature ventricular contraction) frequent 10/21/2022   COPD (chronic obstructive  pulmonary disease) (HCC) 06/07/2022   Hyperlipemia 04/04/2022   First time seizure (HCC) 12/28/2021   Type 2 diabetes mellitus with stage 3b chronic kidney disease, without long-term current use of insulin  (HCC) 10/05/2021   Hemoptysis    Acute exacerbation of CHF (congestive heart failure) (HCC) 10/04/2021   Urinary retention    Scrotal mass 08/01/2020   Nocturia 08/01/2020   Lumbar spinal stenosis 06/08/2019   Healthcare maintenance 05/11/2019   Housing problems 05/11/2019   Difficulty urinating 04/07/2018   Colon cancer screening 04/07/2018   Anemia 12/19/2016   CKD (chronic kidney disease) stage 3, GFR 30-59 ml/min (HCC) 12/13/2016   Syncope 12/12/2016   Chronic systolic heart failure (HCC) 09/06/2016   NSVT (nonsustained ventricular tachycardia) (HCC) 09/06/2016   Non-ischemic cardiomyopathy (HCC) 08/27/2016   Non-obstructive hypertrophic cardiomyopathy (HCC) 08/27/2016   Mitral regurgitation 08/27/2016   Polysubstance abuse (HCC)    AKI (acute kidney injury) (HCC) 08/23/2016   Tobacco abuse 09/19/2014   HTN (hypertension) 03/28/2011   Bilateral lower extremity pain 03/28/2011   Past Medical History:  Diagnosis Date   Arthritis    feels like it in my legs (09/20/2014)   CAD (coronary artery disease)    Chronic kidney disease, stage 3a (HCC)    Chronic systolic CHF (congestive heart failure) (HCC)    Cocaine  abuse (HCC)    Hypertension    NICM (nonischemic cardiomyopathy) (HCC)    NSVT (nonsustained ventricular tachycardia) (HCC) 09/06/2016   pre diabetes    patient denies    Family History  Problem Relation Age of Onset   Stroke Mother 34   Heart disease Father 72   Colon cancer Neg Hx    Colon polyps Neg Hx    Esophageal cancer Neg Hx    Stomach cancer Neg Hx    Rectal cancer Neg Hx     Past Surgical History:  Procedure Laterality Date   BIV ICD INSERTION CRT-D N/A 07/17/2022   Procedure: BIV ICD INSERTION CRT-D;  Surgeon: Fernande Elspeth BROCKS, MD;  Location: [facility]  INVASIVE CV LAB;  Service: Cardiovascular;  Laterality: N/A;   CARDIAC CATHETERIZATION N/A 08/27/2016   Procedure: Right/Left Heart Cath and Coronary Angiography;  Surgeon: Ezra GORMAN Shuck, MD;  Location[facility] INVASIVE CV LAB;  Service: Cardiovascular;  Laterality: N/A;   COLONOSCOPY     CYSTECTOMY Right    back of my shoulder   RIGHT/LEFT HEART CATH AND CORONARY ANGIOGRAPHY N/A 12/04/2020   Procedure: RIGHT/LEFT HEART CATH AND CORONARY ANGIOGRAPHY;  Surgeon: Shuck Ezra GORMAN, MD;  Location: [facility] INVASIVE CV LAB;  Service: Cardiovascular;  Laterality: N/A;   RIGHT/LEFT HEART CATH AND CORONARY ANGIOGRAPHY N/A 10/10/2021  Procedure: RIGHT/LEFT HEART CATH AND CORONARY ANGIOGRAPHY;  Surgeon: Rolan Ezra RAMAN, MD;  Location: [facility] INVASIVE CV LAB;  Service: Cardiovascular;  Laterality: N/A;   TONSILLECTOMY     Social History   Occupational History   Occupation: special educational needs teacher    Comment: has not been able to work steadily for the last year or two  Tobacco Use   Smoking status: Some Days    Current packs/day: 0.10    Average packs/day: 0.1 packs/day for 48.0 years (4.8 ttl pk-yrs)    Types: Cigarettes   Smokeless tobacco: Never   Tobacco comments:    cutting back 2  per day.  No cigs  Vaping Use   Vaping status: Never Used  Substance and Sexual Activity   Alcohol  use: Yes    Alcohol /week: 1.0 standard drink of alcohol     Types: 1 Cans of beer per week    Comment: occasionally   Drug use: Yes    Types: Marijuana    Comment: daily 4 times last 2 weeks ago as of 05/31/2019   Sexual activity: Yes    Birth control/protection: None

## 2023-08-18 ENCOUNTER — Inpatient Hospital Stay
Admission: RE | Admit: 2023-08-18 | Discharge: 2023-08-18 | Disposition: A | Discharge status: Home / Self Care | Payer: Medicare HMO | Source: Ambulatory Visit | Attending: Urology | Admitting: Urology

## 2023-08-19 ENCOUNTER — Inpatient Hospital Stay (HOSPITAL_COMMUNITY)
Admission: RE | Admit: 2023-08-19 | Discharge: 2023-08-19 | Disposition: A | Discharge status: Home / Self Care | Payer: Medicare HMO | Source: Ambulatory Visit | Attending: Cardiology | Admitting: Cardiology

## 2023-08-19 ENCOUNTER — Telehealth: Payer: Self-pay | Admitting: Physician Assistant

## 2023-08-19 ENCOUNTER — Other Ambulatory Visit (HOSPITAL_COMMUNITY): Payer: Medicare HMO

## 2023-08-19 NOTE — Telephone Encounter (Signed)
 Pt called to let Chales Abrahams know injection helped. Still alittle pain but not to much. Pt phone number is [phone]

## 2023-08-26 ENCOUNTER — Ambulatory Visit (HOSPITAL_COMMUNITY)
Admission: RE | Admit: 2023-08-26 | Discharge: 2023-08-26 | Disposition: A | Discharge status: Home / Self Care | Payer: Medicare HMO | Source: Ambulatory Visit | Attending: Cardiology | Admitting: Cardiology

## 2023-08-26 DIAGNOSIS — I5022 Chronic systolic (congestive) heart failure: Secondary | ICD-10-CM | POA: Insufficient documentation

## 2023-08-26 LAB — PULMONARY FUNCTION TEST
DL/VA % pred: 54 %
DL/VA: 2.18 ml/min/mmHg/L
DLCO unc % pred: 45 %
DLCO unc: 12.6 ml/min/mmHg
FEF 25-75 Post: 1.93 L/s
FEF 25-75 Pre: 1.36 L/s
FEF2575-%Change-Post: 41 %
FEF2575-%Pred-Post: 71 %
FEF2575-%Pred-Pre: 50 %
FEV1-%Change-Post: 9 %
FEV1-%Pred-Post: 74 %
FEV1-%Pred-Pre: 68 %
FEV1-Post: 2.66 L
FEV1-Pre: 2.42 L
FEV1FVC-%Change-Post: 8 %
FEV1FVC-%Pred-Pre: 89 %
FEV6-%Change-Post: 1 %
FEV6-%Pred-Post: 79 %
FEV6-%Pred-Pre: 78 %
FEV6-Post: 3.61 L
FEV6-Pre: 3.57 L
FEV6FVC-%Change-Post: 1 %
FEV6FVC-%Pred-Post: 104 %
FEV6FVC-%Pred-Pre: 102 %
FVC-%Change-Post: 1 %
FVC-%Pred-Post: 77 %
FVC-%Pred-Pre: 76 %
FVC-Post: 3.71 L
FVC-Pre: 3.67 L
Post FEV1/FVC ratio: 72 %
Post FEV6/FVC ratio: 99 %
Pre FEV1/FVC ratio: 66 %
Pre FEV6/FVC Ratio: 97 %
RV % pred: 193 %
RV: 4.92 L
TLC % pred: 120 %
TLC: 8.99 L

## 2023-08-26 MED ORDER — ALBUTEROL SULFATE (2.5 MG/3ML) 0.083% IN NEBU
2.5000 mg | INHALATION_SOLUTION | Freq: Once | RESPIRATORY_TRACT | Status: AC
  Administered 2023-08-26: 2.5 mg via RESPIRATORY_TRACT

## 2023-08-27 ENCOUNTER — Ambulatory Visit
Admission: RE | Admit: 2023-08-27 | Discharge: 2023-08-27 | Disposition: A | Discharge status: Home / Self Care | Payer: Medicare HMO | Source: Ambulatory Visit | Attending: Urology

## 2023-08-27 DIAGNOSIS — N401 Enlarged prostate with lower urinary tract symptoms: Secondary | ICD-10-CM

## 2023-08-27 HISTORY — PX: IR RADIOLOGIST EVAL & MGMT: IMG5224

## 2023-08-27 NOTE — Consult Note (Signed)
 Chief Complaint: LUTS  Referring Physician(s): Herrick,Benjamin W  History of Present Illness: Edward Mullen is a 68 y.o. male presenting to VIR clinic, kindly referred by Dr. Dulcy Gibney of [facility], for evaluation of LUTS and candidacy for prostate artery embolization.   Edward Mullen is here by himself for the interview.   He has documented IPSS score 08/15/2023 recorded 5,5,3,5,3,4,4 = 29, with QoL of 5/unhappy. He has been seeing urology for about 2 years now with ongoing symptoms.  His medical therapy has included tamsulosin  and finasteride.  In addition to the obstructive/irritative symptoms, he also has a history of gross hematuria.  He tells me this occurs a few times every month.  He also has a history of retention and catheter requirement.  He passed a voiding trial in January of 2024 and he tells me that he has not needed one since then. Notes reflect that after admission in 7/24 he needed a foley. Currently he does not have a foley Finally, he also has a history of infection, treated with augmentin 9/24.   He was to undergo a MIST procedure in December, however, the procedure was postponed because of concern for his cardiac status.   He sees cardiology for a history of heart failure HTN, DM, and prior MI.  He is  S/p St Jude CRT-D 12/23.   He has a history of substance abuse, endorsing only marijuana at this time. I did tell him, for his info, that the [facility] recognizes marijuana use as a contributing factor to increased risk for MACE, MALE and stroke.   He has CKD stage 3.  Last BMP ws 1.51, with GFR estimated ~50  TRUS was performed at [facility] estimating 173g, with "very large IVP".  Prior CT eval shows estimated volume using bullet formula of  ~124g.  Estimated IVP on this is ~3cm.       Past Medical History:  Diagnosis Date   Arthritis    "feels like it in my legs" (09/20/2014)   CAD (coronary artery disease)    Chronic kidney disease, stage 3a (HCC)    Chronic  systolic CHF (congestive heart failure) (HCC)    Cocaine  abuse (HCC)    Hypertension    NICM (nonischemic cardiomyopathy) (HCC)    NSVT (nonsustained ventricular tachycardia) (HCC) 09/06/2016   pre diabetes    patient denies    Past Surgical History:  Procedure Laterality Date   BIV ICD INSERTION CRT-D N/A 07/17/2022   Procedure: BIV ICD INSERTION CRT-D;  Surgeon: Verona Goodwill, MD;  Location: [facility] INVASIVE CV LAB;  Service: Cardiovascular;  Laterality: N/A;   CARDIAC CATHETERIZATION N/A 08/27/2016   Procedure: Right/Left Heart Cath and Coronary Angiography;  Surgeon: Darlis Eisenmenger, MD;  Location: [facility] INVASIVE CV LAB;  Service: Cardiovascular;  Laterality: N/A;   COLONOSCOPY     CYSTECTOMY Right    "back of my shoulder"   RIGHT/LEFT HEART CATH AND CORONARY ANGIOGRAPHY N/A 12/04/2020   Procedure: RIGHT/LEFT HEART CATH AND CORONARY ANGIOGRAPHY;  Surgeon: Darlis Eisenmenger, MD;  Location: [facility] INVASIVE CV LAB;  Service: Cardiovascular;  Laterality: N/A;   RIGHT/LEFT HEART CATH AND CORONARY ANGIOGRAPHY N/A 10/10/2021   Procedure: RIGHT/LEFT HEART CATH AND CORONARY ANGIOGRAPHY;  Surgeon: Darlis Eisenmenger, MD;  Location: [facility] INVASIVE CV LAB;  Service: Cardiovascular;  Laterality: N/A;   TONSILLECTOMY      Allergies: Patient has no known allergies.  Medications: Prior to Admission medications   Medication Sig Start Date End Date Taking? Authorizing  Provider  acetaminophen  (TYLENOL ) 500 MG tablet Take 2 tablets (1,000 mg total) by mouth every 8 (eight) hours as needed. 12/16/22   Cleven Dallas, DO  albuterol  (PROVENTIL ) (2.5 MG/3ML) 0.083% nebulizer solution USE ONE VIAL (2.5 MG TOTAL) BY NEBULIZATION EVERY 6 (SIX) HOURS AS NEEDED FOR SHORTNESS OF BREATH OR WHEEZING. 10/29/22   Cathey Clunes, MD  albuterol  (VENTOLIN  HFA) 108 (90 Base) MCG/ACT inhaler INHALE 1 TO 2 PUFFS BY MOUTH EVERY 6 HOURS AS NEEDED FOR WHEEZING OR SHORTNESS OF BREATH 10/29/22   Cathey Clunes, MD  amiodarone   (PACERONE ) 200 MG tablet Take 1 tablet (200 mg total) by mouth daily. 11/26/22   Deforest Fast, MD  ASPIRIN  LOW DOSE 81 MG tablet TAKE 1 TABLET (81 MG TOTAL) BY MOUTH DAILY. SWALLOW WHOLE(AM) 01/27/23   Milford, Arlice Bene, FNP  carvedilol  (COREG ) 3.125 MG tablet Take 1 tablet (3.125 mg total) by mouth 2 (two) times daily. 01/29/22 03/25/24  Elmarie Hacking, FNP  Cholecalciferol (VITAMIN D3) 50 MCG (2000 UT) TABS Take 2,000 Units by mouth daily.    [provider]  diclofenac  Sodium (VOLTAREN  ARTHRITIS PAIN) 1 % GEL Apply 4 g topically 4 (four) times daily. 06/30/23   Darlis Eisenmenger, PA-C  gabapentin  (NEURONTIN ) 100 MG capsule Take 2 capsules (200 mg total) by mouth 3 (three) times daily. 12/16/22 03/25/24  Cleven Dallas, DO  JARDIANCE  10 MG TABS tablet Take 10 mg by mouth daily. 04/03/23   [provider]  mexiletine (MEXITIL ) 150 MG capsule TAKE 1 CAPSULE BY MOUTH 2 (TWO) TIMES DAILY (AM+BEDTIME) 07/29/23   Verona Goodwill, MD  rosuvastatin  (CRESTOR ) 20 MG tablet Take 1 tablet (20 mg total) by mouth daily. 07/25/23   Darlis Eisenmenger, MD  spironolactone  (ALDACTONE ) 25 MG tablet Take 0.5 tablets (12.5 mg total) by mouth daily. 07/25/23   Darlis Eisenmenger, MD  torsemide  (DEMADEX ) 20 MG tablet Take 1 tablet (20 mg total) by mouth daily. 08/08/23   Darlis Eisenmenger, MD  zolpidem  (AMBIEN ) 10 MG tablet Take 1 tablet (10 mg total) by mouth at bedtime as needed for sleep. 09/16/22 01/03/23       Family History  Problem Relation Age of Onset   Stroke Mother 22   Heart disease Father 47   Colon cancer Neg Hx    Colon polyps Neg Hx    Esophageal cancer Neg Hx    Stomach cancer Neg Hx    Rectal cancer Neg Hx     Social History   Socioeconomic History   Marital status: Married    Spouse name: Not on file   Number of children: Not on file   Years of education: Not on file   Highest education level: Not on file  Occupational History   Occupation: Special educational needs teacher    Comment: has  not been able to work steadily for the last year or two  Tobacco Use   Smoking status: Some Days    Current packs/day: 0.10    Average packs/day: 0.1 packs/day for 48.0 years (4.8 ttl pk-yrs)    Types: Cigarettes   Smokeless tobacco: Never   Tobacco comments:    cutting back 2  per day.  No cigs  Vaping Use   Vaping status: Never Used  Substance and Sexual Activity   Alcohol use: Yes    Alcohol/week: 1.0 standard drink of alcohol    Types: 1 Cans of beer per week    Comment: occasionally   Drug use: Yes  Types: Marijuana    Comment: daily 4 times last 2 weeks ago as of 05/31/2019   Sexual activity: Yes    Birth control/protection: None  Other Topics Concern   Not on file  Social History Narrative   Not on file   Social Drivers of Health   Financial Resource Strain: Low Risk  (06/17/2022)   Overall Financial Resource Strain (CARDIA)    Difficulty of Paying Living Expenses: Not hard at all  Food Insecurity: No Food Insecurity (04/16/2023)   Hunger Vital Sign    Worried About Running Out of Food in the Last Year: Never true    Ran Out of Food in the Last Year: Never true  Transportation Needs: Unmet Transportation Needs (04/16/2023)   PRAPARE - Transportation    Lack of Transportation (Medical): Yes    Lack of Transportation (Non-Medical): Yes  Physical Activity: Inactive (06/17/2022)   Exercise Vital Sign    Days of Exercise per Week: 0 days    Minutes of Exercise per Session: 0 min  Stress: No Stress Concern Present (06/17/2022)   [employer] of Occupational Health - Occupational Stress Questionnaire    Feeling of Stress : Not at all  Social Connections: Unknown (03/04/2023)   Received from [employer]   Social Network    Social Network: Not on file       Review of Systems: A 12 point ROS discussed and pertinent positives are indicated in the HPI above.  All other systems are negative.  Review of Systems  Vital Signs: BP 133/76   Pulse 89   Resp 20    SpO2 95%   Advance Care Plan: The advanced care plan/surrogate decision maker was discussed at the time of visit and documented in the medical record.    Physical Exam General: 68 yo male appearing stated age.  No distress. HEENT: Atraumatic, normocephalic.  Conjugate gaze, extra-ocular motor intact. No scleral icterus or scleral injection. No lesions on external ears, nose, lips, or gums.  Partial edentulous. Oral mucosa moist, pink.  Neck: Symmetric with no goiter enlargement.  Chest/Lungs:  Symmetric chest with inspiration/expiration.  No labored breathing.    Heart:   . No JVD appreciated.  Abdomen:  Soft, NT/ND, with + bowel sounds.   Genito-urinary: Deferred Neurologic: Alert & Oriented to person, place, and time.   Normal affect and insight.  Appropriate questions.  Moving all 4 extremities with gross sensory intact.  Skin: normal  Mallampati Score:     Imaging: No results found.  Labs:  CBC: Recent Labs    04/15/23 1437 04/15/23 1447 04/16/23 0122 04/17/23 0256 07/07/23 1508  WBC 8.0  --  5.9 6.5 3.6*  HGB 14.9 16.7 12.5* 13.3 13.3  HCT 47.5 49.0 39.5 41.4 41.6  PLT 165  --  126* 134* 109*    COAGS: Recent Labs    03/10/23 1129  INR 1.1  APTT 30    BMP: Recent Labs    07/07/23 1508 07/25/23 1044 08/04/23 1111 08/15/23 1015  NA 142 140 137 140  K 4.1 3.9 4.3 3.8  CL 107 106 98 109  CO2 25 26 29 24   GLUCOSE 82 87 89 93  BUN 18 16 34* 13  CALCIUM  11.1* 10.0 10.8* 9.4  CREATININE 1.71* 1.51* 2.27* 1.51*  GFRNONAA 43* 50* 30* 50*    LIVER FUNCTION TESTS: Recent Labs    11/20/22 1132 11/21/22 0445 03/06/23 1633 03/10/23 1129 04/15/23 1437  BILITOT 1.4*  --  1.5* 0.6 1.0  AST 68*  --  23 20 38  ALT 45*  --  15 14 25   ALKPHOS 106  --  92 121 160*  PROT 8.2*  --  7.7 7.9 8.4*  ALBUMIN  3.1* 3.0* 3.0* 3.0* 3.4*    TUMOR MARKERS: No results for input(s): "AFPTM", "CEA", "CA199", "CHROMGRNA" in the last 8760 hours.  Assessment and  Plan:  Edward Mullen is a 68 yo male with history of severe LUTS, with additional symptoms of episodes of infection/UTI, gross hematuria, and urinary retention requiring catheterization.   His cardiac history puts him at risk for formal TURP and other MIST procedures, so he is considered for prostate artery embolization.    I think he is a good candidate for PAE, reviewing his prior imaging and his TRUS results.  173g prostate with large IVP.    I had a candid discussion with him regarding PAE.  Regarding PAE, we discussed the theory of embolization shrinking the gland, the logistics of PAE with VIR, (typically an outpatient procedure lasting 2-4 hours with moderate sedation), and the side-effect profile (namely the post-embolization syndrome which is expected to last about 7-10 days requiring medical therapy/support).    We discussed risk profile, including: bleeding, infection, arterial injury, contrast reaction, organ failure, non-target embolization, local organ injury, need for further surgery/procedure, acute urinary retention with need for catheter, inability to treat during the intention-to-treat angiogram, cardiopulmonary collapse, death.  Our literature cites a major risk profile of 0.5%.     I would perceive Edward Mullen's greatest risk is his pre-existing renal insufficiency.  He is stage 3 CKD.  We discussed the concern for contrast-induced nephropathy, and steps we can take for protection.   After our discussion he is very interested to proceed.   Plan: - plan for prostate artery embolization with Dr. Mabel Savage, [facility], moderate sedation.  - continue current care  Thank you for this interesting consult.  I greatly enjoyed meeting Edward Mullen and look forward to participating in their care.  A copy of this report was sent to the requesting provider on this date.  Electronically Signed: Myrlene Asper 08/27/2023, 12:18 PM   I spent a total of  60 Minutes   in face to face in clinical  consultation, greater than 50% of which was counseling/coordinating care for LUTS, possible angiogram and embolization/ PAE.

## 2023-09-03 ENCOUNTER — Other Ambulatory Visit (HOSPITAL_COMMUNITY): Payer: Self-pay | Admitting: Interventional Radiology

## 2023-09-03 DIAGNOSIS — N138 Other obstructive and reflux uropathy: Secondary | ICD-10-CM

## 2023-09-05 ENCOUNTER — Telehealth (HOSPITAL_COMMUNITY): Payer: Self-pay

## 2023-09-05 NOTE — Telephone Encounter (Signed)
Called to confirm/remind patient of their appointment at the [facility] on 09/08/23.   Patient reminded to bring all medications and/or complete list.  Confirmed patient has transportation. Gave directions, instructed to utilize valet parking.  Confirmed appointment prior to ending call.

## 2023-09-05 NOTE — Progress Notes (Incomplete)
[facility] Note   Primary Care: Loura Back, NP HF Cardiologist: Dr. Shirlee Latch   HPI: Edward Mullen is a 68 y.o. male with history of HTN, DM, chronic systolic CHF, and polysubstance abuse.   Admitted 08/23/16 with acute systolic CHF in setting of substance abuse. Echo was done, showing EF 15-20%.  Diuresed with IV lasix and meds adjusted as tolerated.  UDS + for cocaine on admission. Cardiac cath showed coronary disease but not significant enough to explain cardiomyopathy. Down 23 lbs from admission weight with diuresis.  Discharge weight 193 lbs.  He was admitted with syncope in 5/18 after using cocaine.  He was noted to have NSVT on telemetry.  Syncope suspected due to VT, no ICD given active substance abuse but had Lifevest placed. EF 50% on 8/18 echo, so Lifevest subsequently removed.  He started using cocaine again and stopped his cardiac medications. In 4/22, he was admitted with acute on chronic systolic CHF.  Frequent NSVT was noted and amiodarone was started.  He had echo showing EF 20-25% with normal RV.  LHC showed 80% PLV stenosis, managed medically.   Admitted 3/23 with a/c CHF and PNA. Echo with EF 20-25%, RV mildly reduced. Started on milrinone and IV lasix. RHC showed mildly elevated PCWP and LVEDP, CI 1.9. Milrinone weaned off and GDMT titrated. Had NSVT/VT, discharged home with LifeVest, weight 202 lbs.  Echo 8/23 showed EF 25-30%, global hypokinesis, moderate LV dilation, mild LVH, mildly decreased RV systolic function. Referred to EP for CRT-D consideration.  S/p St Jude CRT-D 12/23. .  Admitted 4/24 with OOH arrest, found unresponsive in his car. Required 8 mins CPR. Suspected cocaine OD.  UDS + for benzos, THC, and cocaine. Developed cardiogenic shock postcardiac arrest. Required intubation and pressors in CCU. + for rhinovirus during admission. Pressors weaned off. Discharged home on coreg, mexilitine, amiodarone, and torsemide. Losartan and  spironolactone held with hyperkalemia.   Admitted 7/24 with lightheadedness, hypotension and syncope. GDMT initially held but was all restarted at discharge. Suspected dehydration/hypovolemia. Had acute urinary retention and right-sided epididymitis, discharged with foley. Torsemide was decreased from 20 mg daily to 10 mg 3x/week. Discharge weight 181 lbs.   He was again admitted in 9/24 with PNA.   He returns today for followup of CHF.  He has been using a walker for balance.  No dyspnea walking around his house or walking into the office today.  No chest pain.  No orthopnea/PND.  Has post-herpetic neuralgia pain from prior shingles in right flank.  No lightheadedness or palpitations. No orthopnea/PND.  Last positive urine drug screen for cocaine was in 4/24.   St Jude device interrogation: 96% BiV pacing, 26% a-pacing, no VT, recent decrease in thoracic impedance.   ROS: All systems reviewed and negative except as per HPI.   Social History: Lives in [place] with daughter. Prior heavy ETOH, now quit.  Quit smoking 10/20. Has quit using cocaine per his report since 4/24.  Active marijuana.   Family History  Problem Relation Age of Onset   Stroke Mother 47   Heart disease Father 43   Colon cancer Neg Hx    Colon polyps Neg Hx    Esophageal cancer Neg Hx    Stomach cancer Neg Hx    Rectal cancer Neg Hx    Past Medical History: 1. HTN  2. Type II diabetes 3. Cocaine abuse 4. COPD: Prior smoker.  5. Cardiomyopathy: Nonischemic. St Jude CRT-D device.  - Echo (1/18) with  EF 15-20%, moderate LVH, moderate AI, moderate to severe MR, normal RV size with mildly decreased systolic function, PASP 44 mmHg.  - LHC/RHC (1/18): 80% stenosis in PLV branch.  Mean RA 5, PA 47/20 mean 32, mean PCWP 22, CI 2.01.  - Echo (8/18): EF 50% with moderate LVH, diffuse hypokinesis, normal RV size and systolic function, mild AI, trivial MR.  - Echo (4/22): EF 20-25%, severe LV dilation, normal RV. - Echo  (2/23):  EF 20-25%, RV mildly reduced.  - Echo (8/23): EF 25-30%, global hypokinesis, moderate LV dilation, mild LVH, mildly decreased RV systolic function.  6. CAD: LHC (1/18) with 80% stenosis in a branch of the PLV (not enough CAD to explain cardiomyopathy).  - LHC (4/22): PLV 80%, 25% mLAD.  - R/LHC (2/23): 1st RPL 80%, 30% mLAD; mildly elevated PCWP and LVEDP, normal RA pressures, CI 1.9 - Echo 10/23 EF 20-25% RV moderately reduced.  - Echo 4/24 EF25-30%, normal RV.  - Echo 7/24 EF <20%, GIDD, RV mildly reduced systolic function, mild MR.  7. Mitral regurgitation: Moderate to severe on 1/18 echo, probably functional.  Trivial MR on 8/18 echo.  8. CKD: Stage 3.  9. Syncope: 5/18 in setting of cocaine use, concern for VT.  10. Lower extremity arterial dopplers (7/18): No significant PAD.  11. Sciatica 12. NSVT 13. CKD stage 3  Current Outpatient Medications  Medication Sig Dispense Refill   acetaminophen (TYLENOL) 500 MG tablet Take 2 tablets (1,000 mg total) by mouth every 8 (eight) hours as needed. 100 tablet 0   albuterol (PROVENTIL) (2.5 MG/3ML) 0.083% nebulizer solution USE ONE VIAL (2.5 MG TOTAL) BY NEBULIZATION EVERY 6 (SIX) HOURS AS NEEDED FOR SHORTNESS OF BREATH OR WHEEZING. 75 mL 2   albuterol (VENTOLIN HFA) 108 (90 Base) MCG/ACT inhaler INHALE 1 TO 2 PUFFS BY MOUTH EVERY 6 HOURS AS NEEDED FOR WHEEZING OR SHORTNESS OF BREATH 18 g 2   amiodarone (PACERONE) 200 MG tablet Take 1 tablet (200 mg total) by mouth daily. 30 tablet 0   ASPIRIN LOW DOSE 81 MG tablet TAKE 1 TABLET (81 MG TOTAL) BY MOUTH DAILY. SWALLOW WHOLE(AM) 30 tablet 11   carvedilol (COREG) 3.125 MG tablet Take 1 tablet (3.125 mg total) by mouth 2 (two) times daily. 60 tablet 11   Cholecalciferol (VITAMIN D3) 50 MCG (2000 UT) TABS Take 2,000 Units by mouth daily.     diclofenac Sodium (VOLTAREN ARTHRITIS PAIN) 1 % GEL Apply 4 g topically 4 (four) times daily. 100 g 0   gabapentin (NEURONTIN) 100 MG capsule Take 2  capsules (200 mg total) by mouth 3 (three) times daily. 90 capsule 0   JARDIANCE 10 MG TABS tablet Take 10 mg by mouth daily.     mexiletine (MEXITIL) 150 MG capsule TAKE 1 CAPSULE BY MOUTH 2 (TWO) TIMES DAILY (AM+BEDTIME) 60 capsule 0   rosuvastatin (CRESTOR) 20 MG tablet Take 1 tablet (20 mg total) by mouth daily. 90 tablet 3   spironolactone (ALDACTONE) 25 MG tablet Take 0.5 tablets (12.5 mg total) by mouth daily. 15 tablet 3   torsemide (DEMADEX) 20 MG tablet Take 1 tablet (20 mg total) by mouth daily. 30 tablet 3   No current facility-administered medications for this visit.   No Known Allergies  There were no vitals taken for this visit.  Wt Readings from Last 3 Encounters:  07/25/23 87.2 kg (192 lb 3.2 oz)  07/07/23 83.9 kg (185 lb)  04/17/23 82.4 kg (181 lb 10.5 oz)  PHYSICAL EXAM: General: NAD Neck: JVP 8-9 cm, no thyromegaly or thyroid nodule.  Lungs: Occasional rhonchi CV: Nondisplaced PMI.  Heart regular S1/S2, no S3/S4, no murmur.  Trace ankle edema.  No carotid bruit.  Normal pedal pulses.  Abdomen: Soft, nontender, no hepatosplenomegaly, no distention.  Skin: Intact without lesions or rashes.  Neurologic: Alert and oriented x 3.  Psych: Normal affect. Extremities: No clubbing or cyanosis.  HEENT: Normal.   ASSESSMENT & PLAN: 1. Chronic systolic CHF:  EF 15-20% with moderate to severe MR on echo 1/18.  Nonischemic cardiomyopathy, has St Jude CRT-D device. HTN vs cocaine use vs viral vs ETOH. HIV negative, SPEP with no M-spike.  Patient has been noted to have CAD but not enough to explain cardiomyopathy (Cath 2/23 admission with stable 80% PLV stenosis).  Echo in 8/18 showed EF up to 50%. However, he stopped his meds and started cocaine again, echo in 4/22 with EF back down to 20-25% => suspect cocaine abuse may be the primary etiology of cardiomyopathy. Echo in 2/23 with EF 20-25%, RV mildly reduced. RHC (3/23) with mildly elevated PCWP and LVEDP, CI 1.9. Echo (8/23)  showed EF 25-30%, global hypokinesis, moderate LV dilation, mild LVH, mildly decreased RV systolic function. Echo 10/23 EF 20-25% RV moderately reduced.  Echo 7/24 EF <20%, GIDD, RV mildly reduced.  He says he has not used cocaine since around 4/24. CRT device is BiV pacing appropriately.  NYHA class II symptoms but he is volume overloaded by exam and [place], weight is up 4 lbs. Will have to be careful with med titration with CKD and low BP.  - Increase torsemide to 40 mg daily alternating with 20 mg daily. BMET/BNP today and in 10 days.  - Continue carvedilol 3.125 mg bid. - Continue Jardiance 10 mg daily.  - Start spironolactone 12.5 g daily.  2.VT/NSVT: Multiple runs during 3/23 admission, longest was 28 beats. Coronary angiography showed no change in coronaries. Has St Jude CRT-D.  - Continue amiodarone 200 mg daily, follow LFTs and TSH. He should get regular eye exam.  - Continue mexiletine 150 mg BID 3. CKD III: Baseline SCr 1.6. BMET today. 4. CAD: 80% stenosis PLV on cath in 2/23.  No chest pain. - Continue ASA. - Continue statin, check lipids.  6. Polysubstance abuse abuse: He reports no cocaine since 4/24, still smoking marijuana.  7. COPD: I will arrange for PFTs.    Followup in 3 wks with HF pharmacist for med titration, see APP in 6 wks.    Anderson Malta Rishit Burkhalter. 09/05/2023

## 2023-09-08 ENCOUNTER — Encounter (HOSPITAL_COMMUNITY): Payer: Medicare HMO

## 2023-09-11 ENCOUNTER — Telehealth (HOSPITAL_COMMUNITY): Payer: Self-pay | Admitting: Licensed Clinical Social Worker

## 2023-09-11 NOTE — Telephone Encounter (Signed)
H&V Care Navigation CSW Progress Note  Clinical Social Worker consulted to help with transportation for 2/10 appt.  Patient reports he called [facility] and was informed that they do not provide transportation any longer.  See that patient also has Medicaid listed- pt confirms this is still active.  Informed him he can call [employer] to register to utilize this benefit to get to medical appts- pt plan to call today- CSW will follow up with patient next week to ensure he was successful.   SDOH Screenings   Food Insecurity: No Food Insecurity (04/16/2023)  Housing: Low Risk  (04/16/2023)  Transportation Needs: Unmet Transportation Needs (09/11/2023)  Utilities: Not At Risk (04/16/2023)  Alcohol Screen: Low Risk  (06/17/2022)  Depression (PHQ2-9): Low Risk  (12/04/2022)  Financial Resource Strain: Low Risk  (06/17/2022)  Physical Activity: Inactive (06/17/2022)  Social Connections: Unknown (03/04/2023)   Received from [facility]  Stress: No Stress Concern Present (06/17/2022)  Tobacco Use: High Risk (08/15/2023)   Burna Sis, LCSW Clinical Social Worker [facility] Desk#: [phone] Cell#: [phone]

## 2023-09-15 ENCOUNTER — Telehealth (HOSPITAL_COMMUNITY): Payer: Self-pay | Admitting: Licensed Clinical Social Worker

## 2023-09-15 NOTE — Telephone Encounter (Signed)
H&V Care Navigation CSW Progress Note  Clinical Social Worker called pt to check in.  States he has not called [employer] yet about using Medicaid transport-  CSW provided number to pt and he reports he will plan to call today.   SDOH Screenings   Food Insecurity: No Food Insecurity (04/16/2023)  Housing: Low Risk  (04/16/2023)  Transportation Needs: Unmet Transportation Needs (09/11/2023)  Utilities: Not At Risk (04/16/2023)  Alcohol Screen: Low Risk  (06/17/2022)  Depression (PHQ2-9): Low Risk  (12/04/2022)  Financial Resource Strain: Low Risk  (06/17/2022)  Physical Activity: Inactive (06/17/2022)  Social Connections: Unknown (03/04/2023)   Received from [facility]  Stress: No Stress Concern Present (06/17/2022)  Tobacco Use: High Risk (08/15/2023)   Burna Sis, LCSW Clinical Social Worker [facility] Desk#: [phone] Cell#: [phone]

## 2023-09-18 ENCOUNTER — Telehealth (HOSPITAL_COMMUNITY): Payer: Self-pay | Admitting: Licensed Clinical Social Worker

## 2023-09-18 NOTE — Telephone Encounter (Signed)
 H&V Care Navigation CSW Progress Note  Clinical Social Worker called to check in regarding Medicaid transportation.  Pt reports he called social services but was told to call back- he will plan to do so this morning.  At this point it is too close to his Monday appt to arrange transport through Medicaid- he reports he should have a ride for this appt- I encouraged him to call the scheduling desk and inform of need for a ride if he does not have transportation so we can discuss alternative options.   SDOH Screenings   Food Insecurity: No Food Insecurity (04/16/2023)  Housing: Low Risk  (04/16/2023)  Transportation Needs: Unmet Transportation Needs (09/11/2023)  Utilities: Not At Risk (04/16/2023)  Alcohol  Screen: Low Risk  (06/17/2022)  Depression (PHQ2-9): Low Risk  (12/04/2022)  Financial Resource Strain: Low Risk  (06/17/2022)  Physical Activity: Inactive (06/17/2022)  Social Connections: Unknown (03/04/2023)   Received from [facility]  Stress: No Stress Concern Present (06/17/2022)  Tobacco Use: High Risk (08/15/2023)   Andriette HILARIO Leech, LCSW Clinical Social Worker [facility] Desk#: [phone] Cell#: [phone]

## 2023-09-19 ENCOUNTER — Telehealth (HOSPITAL_COMMUNITY): Payer: Self-pay

## 2023-09-19 NOTE — Telephone Encounter (Signed)
 Called to confirm/remind patient of their appointment at the [facility] on 09/22/23.   Patient reminded to bring all medications and/or complete list.  Confirmed patient has transportation. Gave directions, instructed to utilize valet parking.  Confirmed appointment prior to ending call.

## 2023-09-22 ENCOUNTER — Ambulatory Visit (HOSPITAL_COMMUNITY)
Admission: RE | Admit: 2023-09-22 | Discharge: 2023-09-22 | Disposition: A | Discharge status: Home / Self Care | Payer: Medicare HMO | Source: Ambulatory Visit | Attending: Adult Health | Admitting: Adult Health

## 2023-09-22 VITALS — BP 108/72 | HR 68 | Wt 194.4 lb

## 2023-09-22 DIAGNOSIS — E1122 Type 2 diabetes mellitus with diabetic chronic kidney disease: Secondary | ICD-10-CM | POA: Insufficient documentation

## 2023-09-22 DIAGNOSIS — J439 Emphysema, unspecified: Secondary | ICD-10-CM | POA: Insufficient documentation

## 2023-09-22 DIAGNOSIS — Z79899 Other long term (current) drug therapy: Secondary | ICD-10-CM | POA: Diagnosis not present

## 2023-09-22 DIAGNOSIS — I2510 Atherosclerotic heart disease of native coronary artery without angina pectoris: Secondary | ICD-10-CM | POA: Insufficient documentation

## 2023-09-22 DIAGNOSIS — N183 Chronic kidney disease, stage 3 unspecified: Secondary | ICD-10-CM | POA: Diagnosis not present

## 2023-09-22 DIAGNOSIS — I472 Ventricular tachycardia, unspecified: Secondary | ICD-10-CM | POA: Diagnosis not present

## 2023-09-22 DIAGNOSIS — I428 Other cardiomyopathies: Secondary | ICD-10-CM | POA: Insufficient documentation

## 2023-09-22 DIAGNOSIS — Z7984 Long term (current) use of oral hypoglycemic drugs: Secondary | ICD-10-CM | POA: Diagnosis not present

## 2023-09-22 DIAGNOSIS — I4729 Other ventricular tachycardia: Secondary | ICD-10-CM

## 2023-09-22 DIAGNOSIS — I130 Hypertensive heart and chronic kidney disease with heart failure and stage 1 through stage 4 chronic kidney disease, or unspecified chronic kidney disease: Secondary | ICD-10-CM | POA: Diagnosis present

## 2023-09-22 DIAGNOSIS — F1210 Cannabis abuse, uncomplicated: Secondary | ICD-10-CM | POA: Insufficient documentation

## 2023-09-22 DIAGNOSIS — I5022 Chronic systolic (congestive) heart failure: Secondary | ICD-10-CM | POA: Insufficient documentation

## 2023-09-22 LAB — TSH: TSH: 2.077 u[IU]/mL (ref 0.350–4.500)

## 2023-09-22 NOTE — Patient Instructions (Signed)
 It was great to see you today! No medication changes are needed at this time.   Labs today We will only contact you if something comes back abnormal or we need to make some changes. Otherwise no news is good news!  Your physician recommends that you schedule a follow-up appointment in: 3 months with Dr Mitzie Anda   Do the following things EVERYDAY: Weigh yourself in the morning before breakfast. Write it down and keep it in a log. Take your medicines as prescribed Eat low salt foods--Limit salt (sodium) to 2000 mg per day.  Stay as active as you can everyday Limit all fluids for the day to less than 2 liters  At the [facility], you and your health needs are our priority. As part of our continuing mission to provide you with exceptional heart care, we have created designated Provider Care Teams. These Care Teams include your primary Cardiologist (physician) and Advanced Practice Providers (APPs- Physician Assistants and Nurse Practitioners) who all work together to provide you with the care you need, when you need it.   You may see any of the following providers on your designated Care Team at your next follow up: Dr Jules Oar Dr Peder Bourdon Dr. Alwin Baars Dr. Arta Lark Amy Marijane Shoulders, NP Ruddy Corral, [place] [facility] [place], [place] Dennise Fitz, NP [place] Lee, NP Luster Salters, PharmD   Please be sure to bring in all your medications bottles to every appointment.    Thank you for choosing [facility]

## 2023-09-22 NOTE — Progress Notes (Signed)
 [facility] Note   Primary Care: Hershell Lose, NP HF Cardiologist: Dr. Mitzie Anda   HPI: Edward JAROSZ is a 68 y.o. male with history of HTN, DM, chronic systolic CHF, and polysubstance abuse. Former heavy smoker. Quit 2024.   Admitted 08/23/16 with acute systolic CHF in setting of substance abuse. Echo was done, showing EF 15-20%.  Diuresed with IV lasix  and meds adjusted as tolerated.  UDS + for cocaine  on admission. Cardiac cath showed coronary disease but not significant enough to explain cardiomyopathy.   He was admitted with syncope in 5/18 after using cocaine .  He was noted to have NSVT on telemetry.  Syncope suspected due to VT, no ICD given active substance abuse but had Lifevest placed. EF 50% on 8/18 echo,, so Lifevest subsequently removed.  He started using cocaine  again and stopped his cardiac medications. In 4/22, he was admitted with acute on chronic systolic CHF.  Frequent NSVT was noted and amiodarone  was started.  He had echo showing EF 20-25% with normal RV.  LHC showed 80% PLV stenosis, managed medically.   Admitted 3/23 with a/c CHF and PNA. Echo with EF 20-25%, RV mildly reduced. Started on milrinone  and IV lasix . RHC showed mildly elevated PCWP and LVEDP, CI 1.9. Milrinone  weaned off and GDMT titrated. Had NSVT/VT, discharged home with LifeVest, weight 202 lbs.  Echo 8/23 showed EF 25-30%, global hypokinesis, moderate LV dilation, mild LVH, mildly decreased RV systolic function. Referred to EP for CRT-D consideration.  S/P St Jude CRT-D 12/23. .  Admitted 4/24 with OOH arrest, found unresponsive in his car. Required 8 mins CPR. Suspected cocaine  OD.  UDS + for benzos, THC, and cocaine . Developed cardiogenic shock postcardiac arrest. Required intubation and pressors in CCU. + for rhinovirus during admission. Pressors weaned off. Discharged home on coreg , mexilitine, amiodarone , and torsemide . Losartan  and spironolactone  held with hyperkalemia.   Admitted  7/24 with lightheadedness, hypotension and syncope. GDMT initially held but was all restarted at discharge. Suspected dehydration/hypovolemia. Had acute urinary retention and right-sided epididymitis, discharged with foley. Torsemide  was decreased from 20 mg daily to 10 mg 3x/week. Discharge weight 181 lbs.   Today he returns for HF follow up.Overall feeling fine. SOB with exertion but this is his baseline. Uses an electric cart in the grocery store. Denies PND/Orthopnea. Appetite ok. No fever or chills. Weight at home has been stable. Taking all medications. Needs assistance with transportation.   St Jude device interrogation: Impedance elevated. BP 96%  AP 24%   Social History: Lives in [place] with daughter. Prior heavy ETOH, now quit.  Quit smoking 10/20. Has quit using cocaine  per his report since 4/24.  Active marijuana.   Family History  Problem Relation Age of Onset   Stroke Mother 4   Heart disease Father 78   Colon cancer Neg Hx    Colon polyps Neg Hx    Esophageal cancer Neg Hx    Stomach cancer Neg Hx    Rectal cancer Neg Hx    Past Medical History: 1. HTN  2. Type II diabetes 3. Cocaine  abuse 4. COPD: Prior smoker.  5. Cardiomyopathy: Nonischemic. St Jude CRT-D device.  - Echo (1/18) with EF 15-20%, moderate LVH, moderate AI, moderate to severe MR, normal RV size with mildly decreased systolic function, PASP 44 mmHg.  - LHC/RHC (1/18): 80% stenosis in PLV branch.  Mean RA 5, PA 47/20 mean 32, mean PCWP 22, CI 2.01.  - Echo (8/18): EF 50% with moderate LVH, diffuse  hypokinesis, normal RV size and systolic function, mild AI, trivial MR.  - Echo (4/22): EF 20-25%, severe LV dilation, normal RV. - Echo (2/23):  EF 20-25%, RV mildly reduced.  - Echo (8/23): EF 25-30%, global hypokinesis, moderate LV dilation, mild LVH, mildly decreased RV systolic function.  6. CAD: LHC (1/18) with 80% stenosis in a branch of the PLV (not enough CAD to explain cardiomyopathy).  - LHC  (4/22): PLV 80%, 25% mLAD.  - R/LHC (2/23): 1st RPL 80%, 30% mLAD; mildly elevated PCWP and LVEDP, normal RA pressures, CI 1.9 - Echo 10/23 EF 20-25% RV moderately reduced.  - Echo 4/24 EF25-30%, normal RV.  - Echo 7/24 EF <20%, GIDD, RV mildly reduced systolic function, mild MR.  7. Mitral regurgitation: Moderate to severe on 1/18 echo, probably functional.  Trivial MR on 8/18 echo.  8. CKD: Stage 3.  9. Syncope: 5/18 in setting of cocaine  use, concern for VT.  10. Lower extremity arterial dopplers (7/18): No significant PAD.  11. Sciatica 12. NSVT 13. CKD stage 3  Current Outpatient Medications  Medication Sig Dispense Refill   acetaminophen  (TYLENOL ) 500 MG tablet Take 2 tablets (1,000 mg total) by mouth every 8 (eight) hours as needed. 100 tablet 0   albuterol  (PROVENTIL ) (2.5 MG/3ML) 0.083% nebulizer solution USE ONE VIAL (2.5 MG TOTAL) BY NEBULIZATION EVERY 6 (SIX) HOURS AS NEEDED FOR SHORTNESS OF BREATH OR WHEEZING. 75 mL 2   albuterol  (VENTOLIN  HFA) 108 (90 Base) MCG/ACT inhaler INHALE 1 TO 2 PUFFS BY MOUTH EVERY 6 HOURS AS NEEDED FOR WHEEZING OR SHORTNESS OF BREATH 18 g 2   amiodarone  (PACERONE ) 200 MG tablet Take 1 tablet (200 mg total) by mouth daily. 30 tablet 0   ASPIRIN  LOW DOSE 81 MG tablet TAKE 1 TABLET (81 MG TOTAL) BY MOUTH DAILY. SWALLOW WHOLE(AM) 30 tablet 11   carvedilol  (COREG ) 3.125 MG tablet Take 1 tablet (3.125 mg total) by mouth 2 (two) times daily. 60 tablet 11   Cholecalciferol (VITAMIN D3) 50 MCG (2000 UT) TABS Take 2,000 Units by mouth daily.     gabapentin  (NEURONTIN ) 100 MG capsule Take 2 capsules (200 mg total) by mouth 3 (three) times daily. 90 capsule 0   JARDIANCE  10 MG TABS tablet Take 10 mg by mouth daily.     mexiletine (MEXITIL ) 150 MG capsule TAKE 1 CAPSULE BY MOUTH 2 (TWO) TIMES DAILY (AM+BEDTIME) 60 capsule 0   rosuvastatin  (CRESTOR ) 20 MG tablet Take 1 tablet (20 mg total) by mouth daily. 90 tablet 3   spironolactone  (ALDACTONE ) 25 MG tablet Take  0.5 tablets (12.5 mg total) by mouth daily. 15 tablet 3   torsemide  (DEMADEX ) 20 MG tablet Take 1 tablet (20 mg total) by mouth daily. 30 tablet 3   No current facility-administered medications for this encounter.   No Known Allergies  BP 108/72   Pulse 68   Wt 88.2 kg (194 lb 6.4 oz)   SpO2 98%   BMI 26.37 kg/m   Wt Readings from Last 3 Encounters:  09/22/23 88.2 kg (194 lb 6.4 oz)  07/25/23 87.2 kg (192 lb 3.2 oz)  07/07/23 83.9 kg (185 lb)    PHYSICAL EXAM: General: Thin. Walked in the clinic.  No resp difficulty Neck: supple. no JVD. Carotids 2+ bilat; no bruits.  Cor: PMI nondisplaced. Regular rate & rhythm. No rubs, gallops or murmurs. Lungs: clear Abdomen: soft, nontender, nondistended. No hepatosplenomegaly. No bruits or masses. Good bowel sounds. Extremities: no cyanosis, clubbing, rash, edema Neuro:  alert & orientedx3, cranial nerves grossly intact. moves all 4 extremities w/o difficulty. Affect pleasant.   ASSESSMENT & PLAN: 1. Chronic systolic CHF:  EF 15-20% with moderate to severe MR on echo 1/18.  Nonischemic cardiomyopathy, has St Jude CRT-D device. HTN vs cocaine  use vs viral vs ETOH. HIV negative, SPEP with no M-spike.  Patient has been noted to have CAD but not enough to explain cardiomyopathy (Cath 2/23 admission with stable 80% PLV stenosis).  Echo in 8/18 showed EF up to 50%. However, he stopped his meds and started cocaine  again, echo in 4/22 with EF back down to 20-25% => suspect cocaine  abuse may be the primary etiology of cardiomyopathy. Echo in 2/23 with EF 20-25%, RV mildly reduced. RHC (3/23) with mildly elevated PCWP and LVEDP, CI 1.9. Echo (8/23) showed EF 25-30%, global hypokinesis, moderate LV dilation, mild LVH, mildly decreased RV systolic function. Echo 10/23 EF 20-25% RV moderately reduced.  Echo 7/24 EF <20%, GIDD, RV mildly reduced.  He says he has not used cocaine  since around 4/24.  - NYHA III. Appears euvolemic. Continue torsemide  20 mg daily.   - Continue carvedilol  3.125 mg bid. - Continue Jardiance  10 mg daily.  - Continue spironolactone  12.5 mg daily.  2.VT/NSVT: Multiple runs during 3/23 admission, longest was 28 beats. Coronary angiography showed no change in coronaries. Has St Jude CRT-D.  - Continue amiodarone  200 mg daily.  - had PFTS  2025.  -Check TSH.  He should get regular eye exam.  - Continue mexiletine 150 mg BID 3. CKD III: Baseline SCr 1.6 I reviewed BMET from 09/12/23, stable.  4. CAD: 80% stenosis PLV on cath in 2/23.  No chest pain.  - Continue ASA. - Continue statin 6. Polysubstance abuse abuse: He reports no cocaine  since 4/24, still smoking marijuana.  7. COPD:PFTs reviewed. Moderated airway obstruction, emphysema  Follow up in 3 months with Dr Mitzie Anda.   Emmalynn Pinkham NP-C  09/22/2023

## 2023-09-22 NOTE — Progress Notes (Signed)
 H&V Care Navigation CSW Progress Note  Clinical Social Worker met with pt who states he spoke with [facility] regarding Medicaid transport and that he was informed he is all set to use their services.  States he has ride for upcoming lab this week as well as procedure on 2/28.  Will utilize Medicaid transport moving forward when he has needs.  Reports no further concerns at this time.  SDOH Screenings   Food Insecurity: No Food Insecurity (04/16/2023)  Housing: Low Risk  (04/16/2023)  Transportation Needs: Unmet Transportation Needs (09/11/2023)  Utilities: Not At Risk (04/16/2023)  Alcohol Screen: Low Risk  (06/17/2022)  Depression (PHQ2-9): Low Risk  (12/04/2022)  Financial Resource Strain: Low Risk  (06/17/2022)  Physical Activity: Inactive (06/17/2022)  Social Connections: Unknown (03/04/2023)   Received from [facility]  Stress: No Stress Concern Present (06/17/2022)  Tobacco Use: High Risk (08/15/2023)    Edward Flakes, LCSW Clinical Social Worker [facility] Desk#: [phone] Cell#: [phone]

## 2023-09-25 ENCOUNTER — Ambulatory Visit (HOSPITAL_COMMUNITY)
Admission: RE | Admit: 2023-09-25 | Discharge: 2023-09-25 | Disposition: A | Discharge status: Home / Self Care | Payer: Medicare HMO | Source: Ambulatory Visit | Attending: Cardiology | Admitting: Cardiology

## 2023-09-25 DIAGNOSIS — E782 Mixed hyperlipidemia: Secondary | ICD-10-CM | POA: Insufficient documentation

## 2023-09-25 DIAGNOSIS — I5022 Chronic systolic (congestive) heart failure: Secondary | ICD-10-CM | POA: Diagnosis present

## 2023-09-25 LAB — LIPID PANEL
Cholesterol: 106 mg/dL (ref 0–200)
HDL: 32 mg/dL — ABNORMAL LOW (ref >40)
LDL Cholesterol: 58 mg/dL (ref 0–99)
Total CHOL/HDL Ratio: 3.3 {ratio}
Triglycerides: 78 mg/dL (ref <150)
VLDL: 16 mg/dL (ref 0–40)

## 2023-09-25 LAB — HEPATIC FUNCTION PANEL
ALT: 29 U/L (ref 0–44)
AST: 35 U/L (ref 15–41)
Albumin: 3.7 g/dL (ref 3.5–5.0)
Alkaline Phosphatase: 123 U/L (ref 38–126)
Bilirubin, Direct: 0.2 mg/dL (ref 0.0–0.2)
Indirect Bilirubin: 0.6 mg/dL (ref 0.3–0.9)
Total Bilirubin: 0.8 mg/dL (ref 0.0–1.2)
Total Protein: 8.5 g/dL — ABNORMAL HIGH (ref 6.5–8.1)

## 2023-09-30 ENCOUNTER — Telehealth: Payer: Self-pay

## 2023-09-30 ENCOUNTER — Emergency Department (HOSPITAL_COMMUNITY): Payer: Medicare HMO

## 2023-09-30 ENCOUNTER — Other Ambulatory Visit: Payer: Self-pay

## 2023-09-30 ENCOUNTER — Observation Stay (HOSPITAL_COMMUNITY)
Admission: EM | Admit: 2023-09-30 | Discharge: 2023-10-02 | Disposition: A | Discharge status: Home / Self Care | Payer: Medicare HMO | Attending: Internal Medicine | Admitting: Internal Medicine

## 2023-09-30 ENCOUNTER — Emergency Department (HOSPITAL_BASED_OUTPATIENT_CLINIC_OR_DEPARTMENT_OTHER): Payer: Medicare HMO

## 2023-09-30 ENCOUNTER — Encounter (HOSPITAL_COMMUNITY): Payer: Self-pay

## 2023-09-30 DIAGNOSIS — N400 Enlarged prostate without lower urinary tract symptoms: Secondary | ICD-10-CM | POA: Diagnosis not present

## 2023-09-30 DIAGNOSIS — E1165 Type 2 diabetes mellitus with hyperglycemia: Secondary | ICD-10-CM | POA: Insufficient documentation

## 2023-09-30 DIAGNOSIS — I471 Supraventricular tachycardia, unspecified: Secondary | ICD-10-CM | POA: Insufficient documentation

## 2023-09-30 DIAGNOSIS — L308 Other specified dermatitis: Secondary | ICD-10-CM | POA: Diagnosis present

## 2023-09-30 DIAGNOSIS — M79661 Pain in right lower leg: Secondary | ICD-10-CM

## 2023-09-30 DIAGNOSIS — J449 Chronic obstructive pulmonary disease, unspecified: Secondary | ICD-10-CM | POA: Diagnosis not present

## 2023-09-30 DIAGNOSIS — E1122 Type 2 diabetes mellitus with diabetic chronic kidney disease: Secondary | ICD-10-CM | POA: Insufficient documentation

## 2023-09-30 DIAGNOSIS — I472 Ventricular tachycardia, unspecified: Principal | ICD-10-CM | POA: Insufficient documentation

## 2023-09-30 DIAGNOSIS — E11649 Type 2 diabetes mellitus with hypoglycemia without coma: Secondary | ICD-10-CM | POA: Insufficient documentation

## 2023-09-30 DIAGNOSIS — F1910 Other psychoactive substance abuse, uncomplicated: Secondary | ICD-10-CM | POA: Diagnosis present

## 2023-09-30 DIAGNOSIS — I5022 Chronic systolic (congestive) heart failure: Secondary | ICD-10-CM | POA: Diagnosis not present

## 2023-09-30 DIAGNOSIS — I429 Cardiomyopathy, unspecified: Secondary | ICD-10-CM | POA: Insufficient documentation

## 2023-09-30 DIAGNOSIS — F17210 Nicotine dependence, cigarettes, uncomplicated: Secondary | ICD-10-CM | POA: Diagnosis not present

## 2023-09-30 DIAGNOSIS — I130 Hypertensive heart and chronic kidney disease with heart failure and stage 1 through stage 4 chronic kidney disease, or unspecified chronic kidney disease: Secondary | ICD-10-CM | POA: Insufficient documentation

## 2023-09-30 DIAGNOSIS — M25551 Pain in right hip: Secondary | ICD-10-CM | POA: Diagnosis not present

## 2023-09-30 DIAGNOSIS — F32A Depression, unspecified: Secondary | ICD-10-CM | POA: Diagnosis not present

## 2023-09-30 DIAGNOSIS — B0239 Other herpes zoster eye disease: Secondary | ICD-10-CM | POA: Diagnosis not present

## 2023-09-30 DIAGNOSIS — N1831 Chronic kidney disease, stage 3a: Secondary | ICD-10-CM | POA: Insufficient documentation

## 2023-09-30 DIAGNOSIS — Z7982 Long term (current) use of aspirin: Secondary | ICD-10-CM | POA: Diagnosis not present

## 2023-09-30 DIAGNOSIS — N179 Acute kidney failure, unspecified: Secondary | ICD-10-CM | POA: Diagnosis present

## 2023-09-30 DIAGNOSIS — R55 Syncope and collapse: Principal | ICD-10-CM | POA: Diagnosis present

## 2023-09-30 DIAGNOSIS — Z9889 Other specified postprocedural states: Secondary | ICD-10-CM | POA: Insufficient documentation

## 2023-09-30 DIAGNOSIS — B028 Zoster with other complications: Secondary | ICD-10-CM | POA: Diagnosis present

## 2023-09-30 DIAGNOSIS — E785 Hyperlipidemia, unspecified: Secondary | ICD-10-CM | POA: Diagnosis not present

## 2023-09-30 DIAGNOSIS — I4729 Other ventricular tachycardia: Secondary | ICD-10-CM | POA: Insufficient documentation

## 2023-09-30 DIAGNOSIS — I2510 Atherosclerotic heart disease of native coronary artery without angina pectoris: Secondary | ICD-10-CM | POA: Diagnosis not present

## 2023-09-30 DIAGNOSIS — G8929 Other chronic pain: Secondary | ICD-10-CM

## 2023-09-30 DIAGNOSIS — I428 Other cardiomyopathies: Secondary | ICD-10-CM

## 2023-09-30 DIAGNOSIS — F1410 Cocaine abuse, uncomplicated: Secondary | ICD-10-CM

## 2023-09-30 LAB — BASIC METABOLIC PANEL
Anion gap: 13 (ref 5–15)
BUN: 42 mg/dL — ABNORMAL HIGH (ref 8–23)
CO2: 22 mmol/L (ref 22–32)
Calcium: 9.8 mg/dL (ref 8.9–10.3)
Chloride: 103 mmol/L (ref 98–111)
Creatinine, Ser: 2.27 mg/dL — ABNORMAL HIGH (ref 0.61–1.24)
GFR, Estimated: 30 mL/min — ABNORMAL LOW (ref >60)
Glucose, Bld: 108 mg/dL — ABNORMAL HIGH (ref 70–99)
Potassium: 4.2 mmol/L (ref 3.5–5.1)
Sodium: 138 mmol/L (ref 135–145)

## 2023-09-30 LAB — CBC
HCT: 44.9 % (ref 39.0–52.0)
Hemoglobin: 14.4 g/dL (ref 13.0–17.0)
MCH: 28.3 pg (ref 26.0–34.0)
MCHC: 32.1 g/dL (ref 30.0–36.0)
MCV: 88.4 fL (ref 80.0–100.0)
Platelets: 114 10*3/uL — ABNORMAL LOW (ref 150–400)
RBC: 5.08 MIL/uL (ref 4.22–5.81)
RDW: 15.3 % (ref 11.5–15.5)
WBC: 5.8 10*3/uL (ref 4.0–10.5)
nRBC: 0 % (ref 0.0–0.2)

## 2023-09-30 LAB — MAGNESIUM: Magnesium: 2.1 mg/dL (ref 1.7–2.4)

## 2023-09-30 LAB — RAPID URINE DRUG SCREEN, HOSP PERFORMED
Amphetamines: NOT DETECTED
Barbiturates: NOT DETECTED
Benzodiazepines: NOT DETECTED
Cocaine: POSITIVE — AB
Opiates: NOT DETECTED
Tetrahydrocannabinol: POSITIVE — AB

## 2023-09-30 LAB — TROPONIN I (HIGH SENSITIVITY)
Troponin I (High Sensitivity): 18 ng/L — ABNORMAL HIGH (ref <18)
Troponin I (High Sensitivity): 19 ng/L — ABNORMAL HIGH (ref <18)

## 2023-09-30 LAB — BRAIN NATRIURETIC PEPTIDE: B Natriuretic Peptide: 83.8 pg/mL (ref 0.0–100.0)

## 2023-09-30 MED ORDER — HYDROMORPHONE HCL 1 MG/ML IJ SOLN
0.5000 mg | Freq: Once | INTRAMUSCULAR | Status: AC
  Administered 2023-09-30: 0.5 mg via INTRAVENOUS
  Filled 2023-09-30: qty 1

## 2023-09-30 MED ORDER — SODIUM CHLORIDE 0.9 % IV BOLUS
500.0000 mL | Freq: Once | INTRAVENOUS | Status: AC
  Administered 2023-09-30: 500 mL via INTRAVENOUS

## 2023-09-30 NOTE — ED Triage Notes (Addendum)
Pt to ED c/o right leg pain x 6 months, was encouraged to come to ED for evaluation to rule out blood clot.  Of note, pt was notified today of defibrillator firing "Alert remote transmission successful ATP delivered, VT/VF episode" Occurred yesterday  Reports when this occurred yesterday pt felt dizzy, reports this how now resolved.  Denies cp

## 2023-09-30 NOTE — ED Provider Notes (Signed)
[place] EMERGENCY DEPARTMENT AT [facility] Provider Note  CSN: [id number] Arrival date & time: 09/30/23 1359  Chief Complaint(s) Leg Pain (right) and Pacemaker Problem  HPI BERNIE RANSFORD is a 68 y.o. male history of coronary artery disease, CHF, prior history of cocaine abuse now reportedly sober, prior cardiac arrest, diabetes presenting to the emergency department with right leg pain, near syncope.  Patient reports about 6 months of right leg pain.  Denies any recent changes.  Feels some subjective swelling sensation.  No rashes, color change.  No fevers or chills.  He also reports that he was called and told to come to the ER because his pacemaker went off.  He reports that he had an episode about 15 seconds long where he felt very lightheaded, had tunnel vision, felt as if he was about to faint.  He did not faint.  He reports that he felt the poking in his chest and then felt better.  They called him today and told him that he seemed to have an episode of ventricular tachycardia.  Denies any other near syncopal events.  Currently feeling well other than his chronic right leg pain.   Past Medical History Past Medical History:  Diagnosis Date   Arthritis    "feels like it in my legs" (09/20/2014)   CAD (coronary artery disease)    Chronic kidney disease, stage 3a (HCC)    Chronic systolic CHF (congestive heart failure) (HCC)    Cocaine abuse (HCC)    Hypertension    NICM (nonischemic cardiomyopathy) (HCC)    NSVT (nonsustained ventricular tachycardia) (HCC) 09/06/2016   pre diabetes    patient denies   Patient Active Problem List   Diagnosis Date Noted   Acute hypoxemic respiratory failure due to Multifocal Pneumonia 04/15/2023   Epididymoorchitis 03/12/2023   HFrEF (heart failure with reduced ejection fraction) (HCC) 03/11/2023   Volume depletion 03/11/2023   Hypotension 03/10/2023   Shingles rash 12/05/2022   Herpes zoster dermatitis 12/05/2022   Hyperkalemia  12/05/2022   Chest pain, musculoskeletal 12/05/2022   Increased anion gap metabolic acidosis 12/05/2022   Abnormal echocardiogram 11/24/2022   Malnutrition of moderate degree (HCC) 11/19/2022   Cardiogenic shock (HCC) 11/18/2022   Cardiac arrest (HCC) 11/17/2022   LBBB (left bundle branch block) 10/21/2022   PVC (premature ventricular contraction) frequent 10/21/2022   COPD (chronic obstructive pulmonary disease) (HCC) 06/07/2022   Hyperlipemia 04/04/2022   First time seizure (HCC) 12/28/2021   Type 2 diabetes mellitus with stage 3b chronic kidney disease, without long-term current use of insulin (HCC) 10/05/2021   Hemoptysis    Acute exacerbation of CHF (congestive heart failure) (HCC) 10/04/2021   Urinary retention    Scrotal mass 08/01/2020   Nocturia 08/01/2020   Lumbar spinal stenosis 06/08/2019   Healthcare maintenance 05/11/2019   Housing problems 05/11/2019   Difficulty urinating 04/07/2018   Colon cancer screening 04/07/2018   Anemia 12/19/2016   CKD (chronic kidney disease) stage 3, GFR 30-59 ml/min (HCC) 12/13/2016   Syncope 12/12/2016   Chronic systolic heart failure (HCC) 09/06/2016   NSVT (nonsustained ventricular tachycardia) (HCC) 09/06/2016   Non-ischemic cardiomyopathy (HCC) 08/27/2016   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 Authorizing Provider  albuterol (VENTOLIN HFA) 108 (90 Base) MCG/ACT inhaler INHALE 1 TO 2 PUFFS BY MOUTH EVERY 6 HOURS AS NEEDED FOR WHEEZING OR SHORTNESS OF BREATH 10/29/22  Yes Morene Crocker, MD  amiodarone (PACERONE) 200 MG tablet Take 1 tablet (200 mg total) by mouth daily. 11/26/22  Yes Zannie Cove, MD  ASPIRIN LOW DOSE 81 MG tablet TAKE 1 TABLET (81 MG TOTAL) BY MOUTH DAILY. SWALLOW WHOLE(AM) 01/27/23  Yes Milford, Anderson Malta, FNP  carvedilol (COREG) 3.125 MG tablet Take 1 tablet (3.125 mg total) by mouth 2 (two) times daily. 01/29/22 03/25/24 Yes Milford, Anderson Malta, FNP  Cholecalciferol (VITAMIN D3) 50 MCG (2000 UT) TABS Take 2,000 Units by mouth daily.   Yes [provider]  diphenhydramine-acetaminophen (TYLENOL PM) 25-500 MG TABS tablet Take 2 tablets by mouth at bedtime as needed (for sleep).   Yes [provider]  gabapentin (NEURONTIN) 100 MG capsule Take 2 capsules (200 mg total) by mouth 3 (three) times daily. 12/16/22 03/25/24 Yes Rocky Morel, DO  gabapentin (NEURONTIN) 600 MG tablet Take 600 mg by mouth 2 (two) times daily.   Yes [provider]  JARDIANCE 10 MG TABS tablet Take 10 mg by mouth daily. 04/03/23  Yes [provider]  losartan (COZAAR) 25 MG tablet Take 25 mg by mouth daily.   Yes [provider]  mexiletine (MEXITIL) 150 MG capsule TAKE 1 CAPSULE BY MOUTH 2 (TWO) TIMES DAILY (AM+BEDTIME) 07/29/23  Yes Duke Salvia, MD  nortriptyline (PAMELOR) 10 MG capsule Take 10 mg by mouth at bedtime.   Yes [provider]  rosuvastatin (CRESTOR) 20 MG tablet Take 1 tablet (20 mg total) by mouth daily. 07/25/23  Yes Laurey Morale, MD  spironolactone (ALDACTONE) 25 MG tablet Take 0.5 tablets (12.5 mg total) by mouth daily. 07/25/23  Yes Laurey Morale, MD  STIOLTO RESPIMAT 2.5-2.5 MCG/ACT AERS Inhale 2 puffs into the lungs daily. 06/11/23  Yes [provider]  tamsulosin (FLOMAX) 0.4 MG CAPS capsule Take 0.4 mg by mouth at bedtime.   Yes [provider]  torsemide (DEMADEX) 20 MG tablet Take 1 tablet (20 mg total) by mouth daily. 08/08/23  Yes Laurey Morale, MD  acetaminophen (TYLENOL) 500 MG tablet Take 2 tablets (1,000 mg total) by mouth every 8 (eight) hours as needed. Patient not taking:  Reported on 09/30/2023 12/16/22   Rocky Morel, DO  albuterol (PROVENTIL) (2.5 MG/3ML) 0.083% nebulizer solution USE ONE VIAL (2.5 MG TOTAL) BY NEBULIZATION EVERY 6 (SIX) HOURS AS NEEDED FOR SHORTNESS OF BREATH OR WHEEZING. Patient not taking: Reported on 09/30/2023 10/29/22   Morene Crocker, MD  zolpidem (AMBIEN) 10 MG tablet Take 1 tablet (10 mg total) by mouth at bedtime as needed for sleep. 09/16/22 01/03/23  Past Surgical History Past Surgical History:  Procedure Laterality Date   BIV ICD INSERTION CRT-D N/A 07/17/2022   Procedure: BIV ICD INSERTION CRT-D;  Surgeon: Duke Salvia, MD;  Location: [facility] INVASIVE CV LAB;  Service: Cardiovascular;  Laterality: N/A;   CARDIAC CATHETERIZATION N/A 08/27/2016   Procedure: Right/Left Heart Cath and Coronary Angiography;  Surgeon: Laurey Morale, MD;  Location: [facility] INVASIVE CV LAB;  Service: Cardiovascular;  Laterality: N/A;   COLONOSCOPY     CYSTECTOMY Right    "back of my shoulder"   IR RADIOLOGIST EVAL & MGMT  08/27/2023   RIGHT/LEFT HEART CATH AND CORONARY ANGIOGRAPHY N/A 12/04/2020   Procedure: RIGHT/LEFT HEART CATH AND CORONARY ANGIOGRAPHY;  Surgeon: Laurey Morale, MD;  Location: [facility] INVASIVE CV LAB;  Service: Cardiovascular;  Laterality: N/A;   RIGHT/LEFT HEART CATH AND CORONARY ANGIOGRAPHY N/A 10/10/2021   Procedure: RIGHT/LEFT HEART CATH AND CORONARY ANGIOGRAPHY;  Surgeon: Laurey Morale, MD;  Location: [facility] INVASIVE CV LAB;  Service: Cardiovascular;  Laterality: N/A;   TONSILLECTOMY     Family History Family History  Problem Relation Age of Onset   Stroke Mother 92   Heart disease Father 4   Colon cancer Neg Hx    Colon polyps Neg Hx    Esophageal cancer Neg Hx    Stomach cancer Neg Hx    Rectal cancer Neg Hx     Social History Social History   Tobacco Use   Smoking status: Some Days    Current  packs/day: 0.10    Average packs/day: 0.1 packs/day for 48.0 years (4.8 ttl pk-yrs)    Types: Cigarettes   Smokeless tobacco: Never   Tobacco comments:    cutting back 2  per day.  No cigs  Vaping Use   Vaping status: Never Used  Substance Use Topics   Alcohol use: Yes    Alcohol/week: 1.0 standard drink of alcohol    Types: 1 Cans of beer per week    Comment: occasionally   Drug use: Yes    Types: Marijuana    Comment: daily 4 times last 2 weeks ago as of 05/31/2019   Allergies Patient has no known allergies.  Review of Systems Review of Systems  All other systems reviewed and are negative.   Physical Exam Vital Signs  I have reviewed the triage vital signs BP 114/80   Pulse 62   Temp 98.4 F (36.9 C) (Oral)   Resp 19   Ht 6' (1.829 m)   Wt 88 kg   SpO2 97%   BMI 26.31 kg/m  Physical Exam Vitals and nursing note reviewed.  Constitutional:      General: He is not in acute distress.    Appearance: Normal appearance.  HENT:     Mouth/Throat:     Mouth: Mucous membranes are moist.  Eyes:     Conjunctiva/sclera: Conjunctivae normal.  Cardiovascular:     Rate and Rhythm: Normal rate and regular rhythm.     Pulses:          Dorsalis pedis pulses are 2+ on the right side and 2+ on the left side.  Pulmonary:     Effort: Pulmonary effort is normal. No respiratory distress.     Breath sounds: Normal breath sounds.  Abdominal:     General: Abdomen is flat.     Palpations: Abdomen is soft.     Tenderness: There is no abdominal tenderness.  Musculoskeletal:     Right lower leg: No edema.  Left lower leg: No edema.     Comments: Slight tenderness around the right inner thigh, no appreciable edema, crepitus, warmth, swelling, erythema, or other findings  Skin:    General: Skin is warm and dry.     Capillary Refill: Capillary refill takes less than 2 seconds.  Neurological:     Mental Status: He is alert and oriented to person, place, and time. Mental status is  at baseline.  Psychiatric:        Mood and Affect: Mood normal.        Behavior: Behavior normal.     ED Results and Treatments Labs (all labs ordered are listed, but only abnormal results are displayed) Labs Reviewed  BASIC METABOLIC PANEL - Abnormal; Notable for the following components:      Result Value   Glucose, Bld 108 (*)    BUN 42 (*)    Creatinine, Ser 2.27 (*)    GFR, Estimated 30 (*)    All other components within normal limits  CBC - Abnormal; Notable for the following components:   Platelets 114 (*)    All other components within normal limits  RAPID URINE DRUG SCREEN, HOSP PERFORMED - Abnormal; Notable for the following components:   Cocaine POSITIVE (*)    Tetrahydrocannabinol POSITIVE (*)    All other components within normal limits  TROPONIN I (HIGH SENSITIVITY) - Abnormal; Notable for the following components:   Troponin I (High Sensitivity) 18 (*)    All other components within normal limits  TROPONIN I (HIGH SENSITIVITY) - Abnormal; Notable for the following components:   Troponin I (High Sensitivity) 19 (*)    All other components within normal limits  BRAIN NATRIURETIC PEPTIDE  MAGNESIUM                                                                                                                          Radiology DG Chest 1 View Result Date: 09/30/2023 CLINICAL DATA:  Dizziness. EXAM: CHEST  1 VIEW COMPARISON:  Chest radiograph dated 04/15/2023. FINDINGS: Stable cardiomegaly. Stable left chest wall pacemaker. Aortic atherosclerosis. No focal consolidation, pleural effusion, or pneumothorax. No acute osseous abnormality. IMPRESSION: No acute cardiopulmonary findings. Electronically Signed   By: Hart Robinsons M.D.   On: 09/30/2023 17:03   VAS [place] LOWER EXTREMITY VENOUS (DVT) (7a-7p) Result Date: 09/30/2023  Lower Venous DVT Study Patient Name:  MOHANAD CARSTEN  Date of Exam:   09/30/2023 Medical Rec #: [id number]          Accession #:    [id number]  Date of Birth: [birth year]         Patient Gender: M Patient Age:   25 years Exam Location:  [facility] Procedure:      VAS [place] LOWER EXTREMITY VENOUS (DVT) Referring Phys: Riki Sheer --------------------------------------------------------------------------------  Indications: Pain.  Risk Factors: Immobility. Comparison Study: None. Performing Technologist: Shona Simpson  Examination Guidelines: A complete evaluation includes B-mode imaging, spectral Doppler, color Doppler,  and power Doppler as needed of all accessible portions of each vessel. Bilateral testing is considered an integral part of a complete examination. Limited examinations for reoccurring indications may be performed as noted. The reflux portion of the exam is performed with the patient in reverse Trendelenburg.  +---------+---------------+---------+-----------+----------+--------------+ RIGHT    CompressibilityPhasicitySpontaneityPropertiesThrombus Aging +---------+---------------+---------+-----------+----------+--------------+ CFV      Full           Yes      Yes                                 +---------+---------------+---------+-----------+----------+--------------+ SFJ      Full                                                        +---------+---------------+---------+-----------+----------+--------------+ FV Prox  Full                                                        +---------+---------------+---------+-----------+----------+--------------+ FV Mid   Full                                                        +---------+---------------+---------+-----------+----------+--------------+ FV DistalFull                                                        +---------+---------------+---------+-----------+----------+--------------+ PFV      Full                                                        +---------+---------------+---------+-----------+----------+--------------+ POP       Full           Yes      Yes                                 +---------+---------------+---------+-----------+----------+--------------+ PTV      Full                                                        +---------+---------------+---------+-----------+----------+--------------+ PERO     Full                                                        +---------+---------------+---------+-----------+----------+--------------+   +----+---------------+---------+-----------+----------+--------------+  LEFTCompressibilityPhasicitySpontaneityPropertiesThrombus Aging +----+---------------+---------+-----------+----------+--------------+ CFV Full           Yes      Yes                                 +----+---------------+---------+-----------+----------+--------------+     Summary: RIGHT: - There is no evidence of deep vein thrombosis in the lower extremity.  - No cystic structure found in the popliteal fossa.  LEFT: - No evidence of common femoral vein obstruction.   *See table(s) above for measurements and observations. Electronically signed by Lemar Livings MD on 09/30/2023 at 4:34:13 PM.    Final     Pertinent labs & imaging results that were available during my care of the patient were reviewed by me and considered in my medical decision making (see MDM for details).  Medications Ordered in ED Medications  sodium chloride 0.9 % bolus 500 mL (0 mLs Intravenous Stopped 09/30/23 2257)  HYDROmorphone (DILAUDID) injection 0.5 mg (0.5 mg Intravenous Given 09/30/23 2104)                                                                                                                                     Procedures Procedures  (including critical care time)  Medical Decision Making / ED Course   MDM:  68 year old presenting to the emergency department with chronic right leg pain, near syncope.  Reviewed chart, patient apparently had episode of VT which was terminated by ATP  pacing.  Will perform pacemaker interrogation.  Will check labs including troponin, electrolytes, magnesium, BNP.  Will discuss with cardiology.  Patient denies any further episodes.  He also reports chronic right leg pain.  On exam, he has pain to his right inner thigh.  Objectively, he has mild tenderness there but no other acute findings.  DVT ultrasound was already performed and is negative for any acute DVT.  Low concern for occult infectious process, arterial occlusion, other dangerous process at this time.  Clinical Course as of 09/30/23 2355  Tue Sep 30, 2023  1737 VAS [place] LOWER EXTREMITY VENOUS (DVT) (7a-7p) [WS]  2352 COCAINE(!): POSITIVE [WS]  2352 Discussed with Dr. Derrell Lolling with cardiology who will consult.  Discussed with Dr. Loney Loh who will admit for AKI and episode of VT.  [WS]    Clinical Course User Index [WS] Lonell Grandchild, MD     Additional history obtained: -Additional history obtained from ems -External records from outside source obtained and reviewed including: Chart review including previous notes, labs, imaging, consultation notes including prior caridology notes    Lab Tests: -I ordered, reviewed, and interpreted labs.   The pertinent results include:   Labs Reviewed  BASIC METABOLIC PANEL - Abnormal; Notable for the following components:      Result Value   Glucose, Bld 108 (*)    BUN 42 (*)  Creatinine, Ser 2.27 (*)    GFR, Estimated 30 (*)    All other components within normal limits  CBC - Abnormal; Notable for the following components:   Platelets 114 (*)    All other components within normal limits  RAPID URINE DRUG SCREEN, HOSP PERFORMED - Abnormal; Notable for the following components:   Cocaine POSITIVE (*)    Tetrahydrocannabinol POSITIVE (*)    All other components within normal limits  TROPONIN I (HIGH SENSITIVITY) - Abnormal; Notable for the following components:   Troponin I (High Sensitivity) 18 (*)    All other components within  normal limits  TROPONIN I (HIGH SENSITIVITY) - Abnormal; Notable for the following components:   Troponin I (High Sensitivity) 19 (*)    All other components within normal limits  BRAIN NATRIURETIC PEPTIDE  MAGNESIUM    Notable for AKI. UDS + cocaine   EKG   EKG Interpretation Date/Time:  Tuesday September 30 2023 14:17:34 EST Ventricular Rate:  82 PR Interval:  174 QRS Duration:  184 QT Interval:  460 QTC Calculation: 537 R Axis:   269  Text Interpretation: Atrial-sensed ventricular-paced rhythm Biventricular pacemaker detected Abnormal ECG Confirmed by Alvino Blood ([id number]) on 09/30/2023 5:48:42 PM         Imaging Studies ordered: I ordered imaging studies including CXR On my interpretation imaging demonstrates no acute process I independently visualized and interpreted imaging. I agree with the radiologist interpretation   Medicines ordered and prescription drug management: Meds ordered this encounter  Medications   sodium chloride 0.9 % bolus 500 mL   HYDROmorphone (DILAUDID) injection 0.5 mg    -I have reviewed the patients home medicines and have made adjustments as needed   Consultations Obtained: I requested consultation with the cardiologist,  and discussed lab and imaging findings as well as pertinent plan - they recommend: they will consult   Cardiac Monitoring: The patient was maintained on a cardiac monitor.  I personally viewed and interpreted the cardiac monitored which showed an underlying rhythm of: NSR  Social Determinants of Health:  Diagnosis or treatment significantly limited by social determinants of health: polysubstance abuse   Reevaluation: After the interventions noted above, I reevaluated the patient and found that their symptoms have improved  Co morbidities that complicate the patient evaluation  Past Medical History:  Diagnosis Date   Arthritis    "feels like it in my legs" (09/20/2014)   CAD (coronary artery disease)     Chronic kidney disease, stage 3a (HCC)    Chronic systolic CHF (congestive heart failure) (HCC)    Cocaine abuse (HCC)    Hypertension    NICM (nonischemic cardiomyopathy) (HCC)    NSVT (nonsustained ventricular tachycardia) (HCC) 09/06/2016   pre diabetes    patient denies      Dispostion: Disposition decision including need for hospitalization was considered, and patient admitted to the hospital.    Final Clinical Impression(s) / ED Diagnoses Final diagnoses:  Ventricular tachycardia (HCC)  AKI (acute kidney injury) (HCC)  Cocaine abuse (HCC)     This chart was dictated using voice recognition software.  Despite best efforts to proofread,  errors can occur which can change the documentation meaning.    Lonell Grandchild, MD 09/30/23 [phone]

## 2023-09-30 NOTE — Progress Notes (Signed)
Lower extremity venous duplex completed. Please see CV Procedures for preliminary results.  Shona Simpson, RVT 09/30/23 4:08 PM

## 2023-09-30 NOTE — Plan of Care (Incomplete)
68 year old male with history of CAD, CKD stage IIIa, chronic HFrEF status post ICD (EF <20% 02/2023), VT/NSVT, prior PEA arrest, CAD, chronic LBBB, COPD, hypertension, hyperlipidemia, type 2 diabetes, CKD stage IIIa, cocaine/polysubstance abuse, BPH presented to ED for evaluation of right leg pain and near syncope.  Also patient's ICD fired due to episode of ventricular tachycardia and he was sent to the ED for further evaluation.  SBP in the 90s on arrival but remainder vital signs stable.  Labs showing no leukocytosis, normal hemoglobin, mild chronic thrombocytopenia, creatinine 2.2 (baseline around 1.5), initial troponin 18 and repeat pending, BNP normal, magnesium level normal.  Chest x-ray showing no acute cardiopulmonary findings.  Right lower extremity Doppler ultrasound negative for DVT.  Patient was given Dilaudid and 500 mL LR.  TRH called to admit.  EKG: Interpretation limited secondary to paced rhythm.

## 2023-09-30 NOTE — Consult Note (Signed)
Cardiology Consultation   Patient ID: Edward Mullen MRN: [id number]; DOB: 08/27/1955  Admit date: 09/30/2023 Date of Consult: 09/30/2023  PCP:  Loura Back, NP   [facility] Providers Cardiologist:  None  Electrophysiologist:  Sherryl Manges, MD  Advanced Heart Failure:  Marca Ancona, MD       Patient Profile:   Edward Mullen is a 68 y.o. male with a hx of non-ischemic cardiomyopathy (EF<20%) s/p St. Jude CRT-D, coronary artery disease, chronic kidney disease, hypertension, type II diabetes, polysubstance use who is being seen 09/30/2023 for the evaluation of ICD tachytherapy at the request of the Emergency Department.  History of Present Illness:   Edward Mullen states he was in his usual state of health, went on the morning of 2/17, he aperients a sudden near syncopal episode.  He states he felt his heart flutter.  Afterwards he drank water and was back at his baseline.  He denied having other symptoms since then.  He was contacted by his device team 24 hours later, was instructed to go to the emergency department.  Edward Mullen denies chest pain, shortness of breath, fevers, chills, syncope, palpitations, volume overload.  Dates his last episode of syncope occurred when he had his cardiac arrest.  [name] takes his medications via pill pack.  He states he never misses his medications.  He has a history of polysubstance use, most notably cocaine.  He states he last used cocaine 3 to 4 months ago.  He smokes marijuana every other day, and occasionally smokes tobacco.  Edward Mullen has an extensive cardiac history.  He has a history of severe LV dysfunction dating back to 08/2016.  He had an initial improvement in his EF to 60% in 2020, however he has since had severe LV dysfunction. Patient had a syncopal episode in 12/27/2021.  Cardiology evaluate the patient.  An ICD was not recommended at this time given his ongoing substance abuse.  He had a life vest placed and was  initiated on amiodarone at this time.  Establish care with Dr. Graciela Husbands in 06/28/2022, who recommended placement of CRT-D given recurrent syncope and wide QRS greater than 150 with left bundle branch like morphology.  He underwent CRT-D placement in 07/17/2022.  Edward Mullen experienced sustained VT requiring ATP immediately after device implant.  He was maintained on amiodarone and mexiletine was added in 07/2022. His antiarrhythmic medications include amiodarone 200 mg, mexiletine 150 mg twice daily.  He was admitted to [facility] from 11/17/2022 - 11/26/2022 for an outside hospital PEA cardiac arrest that required 8 minutes of CPR.Marland Kitchen  His device was interrogated, which did not show episodes of malignant ventricular arrhythmias.  Edward Mullen was admitted at [facility] from 03/10/2023 - 03/12/2019 for for a near syncopal episode.  His systolic blood pressures were noted to be in the 60s.  During his visit there was no clear cause for syncopal episode.  He was resumed on his usual dose of mexiletine and amiodarone.  His torsemide was decreased.  He was most recently admitted at [facility] from 04/15/2023 - 04/17/2023 for community-acquired pneumonia, that was treated with ceftriaxone and doxycycline.  In the ER, vitals include blood pressure 94/70, heart rate 81, SpO2 100% on room air.  Notable labs include sodium 138, potassium 4.2, magnesium 2.1, creatinine 2.27, high-sensitivity troponin 18-> 19 white blood cell count 5.8, hemoglobin 14.4, platelet count 114 K.   Past Medical History:  Diagnosis Date   Arthritis    "feels  like it in my legs" (09/20/2014)   CAD (coronary artery disease)    Chronic kidney disease, stage 3a (HCC)    Chronic systolic CHF (congestive heart failure) (HCC)    Cocaine abuse (HCC)    Hypertension    NICM (nonischemic cardiomyopathy) (HCC)    NSVT (nonsustained ventricular tachycardia) (HCC) 09/06/2016   pre diabetes    patient denies    Past Surgical History:  Procedure  Laterality Date   BIV ICD INSERTION CRT-D N/A 07/17/2022   Procedure: BIV ICD INSERTION CRT-D;  Surgeon: Duke Salvia, MD;  Location: [facility] INVASIVE CV LAB;  Service: Cardiovascular;  Laterality: N/A;   CARDIAC CATHETERIZATION N/A 08/27/2016   Procedure: Right/Left Heart Cath and Coronary Angiography;  Surgeon: Laurey Morale, MD;  Location: [facility] INVASIVE CV LAB;  Service: Cardiovascular;  Laterality: N/A;   COLONOSCOPY     CYSTECTOMY Right    "back of my shoulder"   IR RADIOLOGIST EVAL & MGMT  08/27/2023   RIGHT/LEFT HEART CATH AND CORONARY ANGIOGRAPHY N/A 12/04/2020   Procedure: RIGHT/LEFT HEART CATH AND CORONARY ANGIOGRAPHY;  Surgeon: Laurey Morale, MD;  Location: [facility] INVASIVE CV LAB;  Service: Cardiovascular;  Laterality: N/A;   RIGHT/LEFT HEART CATH AND CORONARY ANGIOGRAPHY N/A 10/10/2021   Procedure: RIGHT/LEFT HEART CATH AND CORONARY ANGIOGRAPHY;  Surgeon: Laurey Morale, MD;  Location: [facility] INVASIVE CV LAB;  Service: Cardiovascular;  Laterality: N/A;   TONSILLECTOMY       Home Medications:  Prior to Admission medications   Medication Sig Start Date End Date Taking? Authorizing Provider  acetaminophen (TYLENOL) 500 MG tablet Take 2 tablets (1,000 mg total) by mouth every 8 (eight) hours as needed. 12/16/22   Rocky Morel, DO  albuterol (PROVENTIL) (2.5 MG/3ML) 0.083% nebulizer solution USE ONE VIAL (2.5 MG TOTAL) BY NEBULIZATION EVERY 6 (SIX) HOURS AS NEEDED FOR SHORTNESS OF BREATH OR WHEEZING. 10/29/22   Morene Crocker, MD  albuterol (VENTOLIN HFA) 108 (90 Base) MCG/ACT inhaler INHALE 1 TO 2 PUFFS BY MOUTH EVERY 6 HOURS AS NEEDED FOR WHEEZING OR SHORTNESS OF BREATH 10/29/22   Morene Crocker, MD  amiodarone (PACERONE) 200 MG tablet Take 1 tablet (200 mg total) by mouth daily. 11/26/22   Zannie Cove, MD  ASPIRIN LOW DOSE 81 MG tablet TAKE 1 TABLET (81 MG TOTAL) BY MOUTH DAILY. SWALLOW WHOLE(AM) 01/27/23   [place], Anderson Malta, FNP  carvedilol (COREG) 3.125 MG tablet Take 1  tablet (3.125 mg total) by mouth 2 (two) times daily. 01/29/22 03/25/24  Jacklynn Ganong, FNP  Cholecalciferol (VITAMIN D3) 50 MCG (2000 UT) TABS Take 2,000 Units by mouth daily.    [provider]  gabapentin (NEURONTIN) 100 MG capsule Take 2 capsules (200 mg total) by mouth 3 (three) times daily. 12/16/22 03/25/24  Rocky Morel, DO  gabapentin (NEURONTIN) 600 MG tablet Take 600 mg by mouth 2 (two) times daily.    [provider]  JARDIANCE 10 MG TABS tablet Take 10 mg by mouth daily. 04/03/23   [provider]  losartan (COZAAR) 25 MG tablet Take 25 mg by mouth daily.    [provider]  mexiletine (MEXITIL) 150 MG capsule TAKE 1 CAPSULE BY MOUTH 2 (TWO) TIMES DAILY (AM+BEDTIME) 07/29/23   Duke Salvia, MD  nortriptyline (PAMELOR) 10 MG capsule Take 10 mg by mouth at bedtime.    [provider]  rosuvastatin (CRESTOR) 20 MG tablet Take 1 tablet (20 mg total) by mouth daily. 07/25/23   Laurey Morale, MD  spironolactone (ALDACTONE) 25 MG tablet Take 0.5 tablets (12.5 mg total) by mouth daily. 07/25/23   Laurey Morale, MD  STIOLTO RESPIMAT 2.5-2.5 MCG/ACT AERS Inhale 2 puffs into the lungs daily. 06/11/23   [provider]  tamsulosin (FLOMAX) 0.4 MG CAPS capsule Take 0.4 mg by mouth at bedtime.    [provider]  torsemide (DEMADEX) 20 MG tablet Take 1 tablet (20 mg total) by mouth daily. 08/08/23   Laurey Morale, MD  zolpidem (AMBIEN) 10 MG tablet Take 1 tablet (10 mg total) by mouth at bedtime as needed for sleep. 09/16/22 01/03/23      Inpatient Medications: Scheduled Meds:  Continuous Infusions:  PRN Meds:   Allergies:   No Known Allergies  Social History:   Social History   Socioeconomic History   Marital status: Married    Spouse name: Not on file   Number of children: Not on file   Years of education: Not on file   Highest education level: Not on file  Occupational History   Occupation: Special educational needs teacher     Comment: has not been able to work steadily for the last year or two  Tobacco Use   Smoking status: Some Days    Current packs/day: 0.10    Average packs/day: 0.1 packs/day for 48.0 years (4.8 ttl pk-yrs)    Types: Cigarettes   Smokeless tobacco: Never   Tobacco comments:    cutting back 2  per day.  No cigs  Vaping Use   Vaping status: Never Used  Substance and Sexual Activity   Alcohol use: Yes    Alcohol/week: 1.0 standard drink of alcohol    Types: 1 Cans of beer per week    Comment: occasionally   Drug use: Yes    Types: Marijuana    Comment: daily 4 times last 2 weeks ago as of 05/31/2019   Sexual activity: Yes    Birth control/protection: None  Other Topics Concern   Not on file  Social History Narrative   Not on file   Social Drivers of Health   Financial Resource Strain: Low Risk  (06/17/2022)   Overall Financial Resource Strain (CARDIA)    Difficulty of Paying Living Expenses: Not hard at all  Food Insecurity: No Food Insecurity (04/16/2023)   Hunger Vital Sign    Worried About Running Out of Food in the Last Year: Never true    Ran Out of Food in the Last Year: Never true  Transportation Needs: Unmet Transportation Needs (09/11/2023)   PRAPARE - Transportation    Lack of Transportation (Medical): Yes    Lack of Transportation (Non-Medical): Yes  Physical Activity: Inactive (06/17/2022)   Exercise Vital Sign    Days of Exercise per Week: 0 days    Minutes of Exercise per Session: 0 min  Stress: No Stress Concern Present (06/17/2022)   [employer] of Occupational Health - Occupational Stress Questionnaire    Feeling of Stress : Not at all  Social Connections: Unknown (03/04/2023)   Received from [facility]   Social Network    Social Network: Not on file  Intimate Partner Violence: Not At Risk (04/16/2023)   Humiliation, Afraid, Rape, and Kick questionnaire    Fear of Current or Ex-Partner: No    Emotionally Abused: No    Physically Abused: No     Sexually Abused: No    Family History:    Family History  Problem Relation Age of Onset   Stroke Mother 62  Heart disease Father 7   Colon cancer Neg Hx    Colon polyps Neg Hx    Esophageal cancer Neg Hx    Stomach cancer Neg Hx    Rectal cancer Neg Hx      ROS:  Please see the history of present illness.   All other ROS reviewed and negative.     Physical Exam/Data:   Vitals:   09/30/23 2130 09/30/23 2145 09/30/23 2200 09/30/23 2221  BP: 109/84 115/76 108/78   Pulse: 66 62 64   Resp: 19 13 15    Temp:    98.4 F (36.9 C)  TempSrc:    Oral  SpO2: 96% 98% 99%   Weight:      Height:       No intake or output data in the 24 hours ending 09/30/23 2238    09/30/2023    2:18 PM 09/22/2023    9:34 AM 07/25/2023   10:00 AM  Last 3 Weights  Weight (lbs) 194 lb 194 lb 6.4 oz 192 lb 3.2 oz  Weight (kg) 87.998 kg 88.179 kg 87.181 kg     Body mass index is 26.31 kg/m.  General:  Well nourished, well developed, in no acute distress HEENT: normal Neck: no JVD Vascular: No carotid bruits; Distal pulses 2+ bilaterally Cardiac:  normal S1, S2; RRR; no murmur  Lungs:  clear to auscultation bilaterally, no wheezing, rhonchi or rales  Abd: soft, nontender, no hepatomegaly  Ext: no edema Musculoskeletal:  No deformities, BUE and BLE strength normal and equal Skin: warm and dry  Neuro:  CNs 2-12 intact, no focal abnormalities noted Psych:  Normal affect   EKG:  The EKG was personally reviewed and demonstrates:  sinus rhythm with AS-VP rhythm. No evidence of ischemia or infarct Telemetry:  Telemetry was personally reviewed and demonstrates:  AS-VP rhythm  Relevant CV Studies:  03/11/2023   1. Left ventricular ejection fraction, by estimation, is <20%. The left  ventricle has severely decreased function. The left ventricle demonstrates  global hypokinesis. No LV thrombus noted on contrast images. The left  ventricular internal cavity size was   severely dilated. Left  ventricular diastolic parameters are consistent  with Grade I diastolic dysfunction (impaired relaxation).   2. Right ventricular systolic function is mildly reduced. The right  ventricular size is normal. Tricuspid regurgitation signal is inadequate  for assessing PA pressure.   3. Left atrial size was mildly dilated.   4. Right atrial size was mildly dilated.   5. The mitral valve is normal in structure. Mild mitral valve  regurgitation. No evidence of mitral stenosis.   6. The aortic valve is tricuspid. Aortic valve regurgitation is mild to  moderate. No aortic stenosis is present.   7. Aortic dilatation noted. There is mild dilatation of the aortic root,  measuring 40 mm.   10/10/2021    1st RPL lesion is 80% stenosed.   Mid LAD lesion is 30% stenosed.   1. Stable coronaries, there is 80% stenosis in a relatively small PLV branch.  2. Mildly elevated PCWP and LVEDP, normal RA pressure.  3. Cardiac index is decreased at 1.9.    Laboratory Data:  High Sensitivity Troponin:   Recent Labs  Lab 09/30/23 1940 09/30/23 2040  TROPONINIHS 18* 19*     Chemistry Recent Labs  Lab 09/30/23 1432 09/30/23 1940  NA 138  --   K 4.2  --   CL 103  --   CO2 22  --  GLUCOSE 108*  --   BUN 42*  --   CREATININE 2.27*  --   CALCIUM 9.8  --   MG  --  2.1  GFRNONAA 30*  --   ANIONGAP 13  --     Recent Labs  Lab 09/25/23 0956  PROT 8.5*  ALBUMIN 3.7  AST 35  ALT 29  ALKPHOS 123  BILITOT 0.8   Lipids  Recent Labs  Lab 09/25/23 0956  CHOL 106  TRIG 78  HDL 32*  LDLCALC 58  CHOLHDL 3.3    Hematology Recent Labs  Lab 09/30/23 1432  WBC 5.8  RBC 5.08  HGB 14.4  HCT 44.9  MCV 88.4  MCH 28.3  MCHC 32.1  RDW 15.3  PLT 114*   Thyroid No results for input(s): "TSH", "FREET4" in the last 168 hours.  BNP Recent Labs  Lab 09/30/23 1940  BNP 83.8    DDimer No results for input(s): "DDIMER" in the last 168 hours.   Radiology/Studies:  DG Chest 1 View Result  Date: 09/30/2023 CLINICAL DATA:  Dizziness. EXAM: CHEST  1 VIEW COMPARISON:  Chest radiograph dated 04/15/2023. FINDINGS: Stable cardiomegaly. Stable left chest wall pacemaker. Aortic atherosclerosis. No focal consolidation, pleural effusion, or pneumothorax. No acute osseous abnormality. IMPRESSION: No acute cardiopulmonary findings. Electronically Signed   By: Hart Robinsons M.D.   On: 09/30/2023 17:03   VAS [place] LOWER EXTREMITY VENOUS (DVT) (7a-7p) Result Date: 09/30/2023  Lower Venous DVT Study Patient Name:  Edward Mullen  Date of Exam:   09/30/2023 Medical Rec #: [id number]          Accession #:    [id number] Date of Birth: [birth year]         Patient Gender: M Patient Age:   29 years Exam Location:  [facility] Procedure:      VAS [place] LOWER EXTREMITY VENOUS (DVT) Referring Phys: Riki Sheer --------------------------------------------------------------------------------  Indications: Pain.  Risk Factors: Immobility. Comparison Study: None. Performing Technologist: Shona Simpson  Examination Guidelines: A complete evaluation includes B-mode imaging, spectral Doppler, color Doppler, and power Doppler as needed of all accessible portions of each vessel. Bilateral testing is considered an integral part of a complete examination. Limited examinations for reoccurring indications may be performed as noted. The reflux portion of the exam is performed with the patient in reverse Trendelenburg.  +---------+---------------+---------+-----------+----------+--------------+ RIGHT    CompressibilityPhasicitySpontaneityPropertiesThrombus Aging +---------+---------------+---------+-----------+----------+--------------+ CFV      Full           Yes      Yes                                 +---------+---------------+---------+-----------+----------+--------------+ SFJ      Full                                                         +---------+---------------+---------+-----------+----------+--------------+ FV Prox  Full                                                        +---------+---------------+---------+-----------+----------+--------------+ FV Mid   Full                                                        +---------+---------------+---------+-----------+----------+--------------+  FV DistalFull                                                        +---------+---------------+---------+-----------+----------+--------------+ PFV      Full                                                        +---------+---------------+---------+-----------+----------+--------------+ POP      Full           Yes      Yes                                 +---------+---------------+---------+-----------+----------+--------------+ PTV      Full                                                        +---------+---------------+---------+-----------+----------+--------------+ PERO     Full                                                        +---------+---------------+---------+-----------+----------+--------------+   +----+---------------+---------+-----------+----------+--------------+ LEFTCompressibilityPhasicitySpontaneityPropertiesThrombus Aging +----+---------------+---------+-----------+----------+--------------+ CFV Full           Yes      Yes                                 +----+---------------+---------+-----------+----------+--------------+     Summary: RIGHT: - There is no evidence of deep vein thrombosis in the lower extremity.  - No cystic structure found in the popliteal fossa.  LEFT: - No evidence of common femoral vein obstruction.   *See table(s) above for measurements and observations. Electronically signed by Lemar Livings MD on 09/30/2023 at 4:34:13 PM.    Final      Assessment and Plan:   Edward Mullen is a 68 y.o. male with a hx of non-ischemic cardiomyopathy  (EF<20%) s/p St. Jude CRT-D, coronary artery disease, chronic kidney disease, hypertension, type II diabetes, polysubstance use who is being seen 09/30/2023 for the evaluation of sustained VT.  Luie presented today after having a syncopal episode.  His device interrogation showed sustained VT on 2/17 for approximately 8 seconds with cycle length of 295 ms, terminated by ATP x 1.  He is currently asymptomatic.  Edward Mullen states he is adherent with his mexiletine and amiodarone.  States he has not used cocaine in 3 to 4 months.  His electrolytes are normal.  Etiology likely related to heart mediated VT in the setting of persistent nonischemic cardiomyopathy vs. substance use.  We will make no changes to his amiodarone and mexiletine at this time, however could consider increasing his mexiletine.  Device interrogation also showed episodes of nonsustained VT, unclear if origin is similar to his sustained event.  Consider obtaining cardiac MRI to identify substrate.  If he becomes unstable, would discontinue mexiletine and replace with lidocaine.  QTc is 537 ms  Recommendations -continue amiodarone 200mg  -continue mexiletine 150mg  BID -if develops sustained VT, discontinue mexiletine and start lidocaine gtt -EP to staff in AM -consider cardiac MRI -goal K>4, Mg>2  Risk Assessment/Risk Scores:                For questions or updates, please contact [facility] Please consult [email or web address] for contact info under    Signed, Donavan Burnet, MD  09/30/2023 10:38 PM

## 2023-09-30 NOTE — ED Provider Triage Note (Signed)
Emergency Medicine Provider Triage Evaluation Note  Edward Mullen , a 68 y.o. male  was evaluated in triage.  Pt complains of pacemaker problem and leg pain.  States yesterday he felt dizzy but no chest pain.  Lasted for 14 to 15 seconds.  Was advised by cardiology today that his pacemaker did fire.  Also reporting right leg pain that is been there for several months.  Advised to evaluate for "blood clot".  Of note, pt was notified today of defibrillator firing "Alert remote transmission successful ATP delivered, VT/VF episode" Occurred yesterday   Review of Systems  Positive: See above Negative: See above  Physical Exam  BP 94/70   Pulse 81   Temp 97.6 F (36.4 C) (Oral)   Resp 18   Ht 6' (1.829 m)   Wt 88 kg   SpO2 100%   BMI 26.31 kg/m  Gen:   Awake, no distress   Resp:  Normal effort  MSK:   Moves extremities without difficulty  Other:    Medical Decision Making  Medically screening exam initiated at 2:22 PM.  Appropriate orders placed.  Edward Mullen was informed that the remainder of the evaluation will be completed by another provider, this initial triage assessment does not replace that evaluation, and the importance of remaining in the ED until their evaluation is complete.  Work up started   Edward Mullen, [place], PA-C 09/30/23 1424

## 2023-09-30 NOTE — Telephone Encounter (Signed)
Near syncopal and lasted about 10 seconds at time of event. Feels fine after. Pt is compliant with medications. Driving restrictions x 6 months advised.   While on the phone patient verbalized concern for swelling in leg. Upon further assessment he reports his R leg is swollen R>L, is painful up into his hip and down his leg, RLE is cooler to the touch compared to LLE, and R foot is numb impeding his mobility to the point he has to use his walker. I advised patient to go to the ED for assessment and eval for potential DVT. Pt verbalized understanding and was calling granddaughter after we hung up to drive him to the ED. Overdue for EP f/u also. Sent to scheduling. Reviewed w/ AT. Sent to MD.

## 2023-09-30 NOTE — Telephone Encounter (Signed)
Alert remote transmission: Successful ATP Therapy Delivered, VT/VF Episode Occurred. 1 VT-2 classified episode on 09/29/23 at 1:44 pm, 14 sec duration, V>A conduction at 203 bpm appropriately and successfully treated with ATP x 1 Burst. Routed to clinic for review.  Follow up as scheduled. MC, CVRS  Called patient to assess symptoms, med compliance, give ED precautions and advise of driving restrictions. Currently prescribed Amio 200 daily, Coreg 3.125 BID, mexitil 150 BID, spiro 25 daily, and torsemid 20 daily. Will send to [place], RN to inquire about ICM monitoring.   Spoke w/ wife and she will tell pt to call us.

## 2023-10-01 DIAGNOSIS — R55 Syncope and collapse: Secondary | ICD-10-CM | POA: Diagnosis not present

## 2023-10-01 DIAGNOSIS — G8929 Other chronic pain: Secondary | ICD-10-CM

## 2023-10-01 LAB — CBC
HCT: 42.4 % (ref 39.0–52.0)
Hemoglobin: 13.5 g/dL (ref 13.0–17.0)
MCH: 28.5 pg (ref 26.0–34.0)
MCHC: 31.8 g/dL (ref 30.0–36.0)
MCV: 89.5 fL (ref 80.0–100.0)
Platelets: 93 10*3/uL — ABNORMAL LOW (ref 150–400)
RBC: 4.74 MIL/uL (ref 4.22–5.81)
RDW: 15.3 % (ref 11.5–15.5)
WBC: 5.1 10*3/uL (ref 4.0–10.5)
nRBC: 0 % (ref 0.0–0.2)

## 2023-10-01 LAB — GLUCOSE, CAPILLARY
Glucose-Capillary: 84 mg/dL (ref 70–99)
Glucose-Capillary: 72 mg/dL (ref 70–99)

## 2023-10-01 LAB — BASIC METABOLIC PANEL
Anion gap: 10 (ref 5–15)
BUN: 40 mg/dL — ABNORMAL HIGH (ref 8–23)
CO2: 21 mmol/L — ABNORMAL LOW (ref 22–32)
Calcium: 8.9 mg/dL (ref 8.9–10.3)
Chloride: 105 mmol/L (ref 98–111)
Creatinine, Ser: 1.57 mg/dL — ABNORMAL HIGH (ref 0.61–1.24)
GFR, Estimated: 47 mL/min — ABNORMAL LOW (ref >60)
Glucose, Bld: 77 mg/dL (ref 70–99)
Potassium: 4.2 mmol/L (ref 3.5–5.1)
Sodium: 136 mmol/L (ref 135–145)

## 2023-10-01 LAB — CBG MONITORING, ED
Glucose-Capillary: 44 mg/dL — CL (ref 70–99)
Glucose-Capillary: 117 mg/dL — ABNORMAL HIGH (ref 70–99)

## 2023-10-01 LAB — CREATININE, SERUM
Creatinine, Ser: 1.79 mg/dL — ABNORMAL HIGH (ref 0.61–1.24)
GFR, Estimated: 41 mL/min — ABNORMAL LOW (ref >60)

## 2023-10-01 LAB — HIV ANTIBODY (ROUTINE TESTING W REFLEX): HIV Screen 4th Generation wRfx: NONREACTIVE

## 2023-10-01 MED ORDER — ALBUTEROL SULFATE (2.5 MG/3ML) 0.083% IN NEBU: 2.5000 mg | INHALATION_SOLUTION | Freq: Four times a day (QID) | RESPIRATORY_TRACT | Status: DC | PRN

## 2023-10-01 MED ORDER — AMIODARONE HCL 200 MG PO TABS
200.0000 mg | ORAL_TABLET | Freq: Every day | ORAL | Status: DC
  Administered 2023-10-01: 200 mg via ORAL
  Filled 2023-10-01: qty 1

## 2023-10-01 MED ORDER — TAMSULOSIN HCL 0.4 MG PO CAPS
0.4000 mg | ORAL_CAPSULE | Freq: Every day | ORAL | Status: DC
  Administered 2023-10-01: 0.4 mg via ORAL
  Filled 2023-10-01: qty 1

## 2023-10-01 MED ORDER — UMECLIDINIUM BROMIDE 62.5 MCG/ACT IN AEPB
1.0000 | INHALATION_SPRAY | Freq: Every day | RESPIRATORY_TRACT | Status: DC
  Administered 2023-10-02: 1 via RESPIRATORY_TRACT
  Filled 2023-10-01: qty 7

## 2023-10-01 MED ORDER — NORTRIPTYLINE HCL 10 MG PO CAPS
10.0000 mg | ORAL_CAPSULE | Freq: Every day | ORAL | Status: DC
  Administered 2023-10-01: 10 mg via ORAL
  Filled 2023-10-01 (×2): qty 1

## 2023-10-01 MED ORDER — SODIUM CHLORIDE 0.9 % IV SOLN
INTRAVENOUS | Status: DC
Start: 2023-10-01 — End: 2023-10-01

## 2023-10-01 MED ORDER — ACETAMINOPHEN 650 MG RE SUPP: 650.0000 mg | Freq: Four times a day (QID) | RECTAL | Status: DC | PRN

## 2023-10-01 MED ORDER — ROSUVASTATIN CALCIUM 20 MG PO TABS
20.0000 mg | ORAL_TABLET | Freq: Every day | ORAL | Status: DC
  Administered 2023-10-01 – 2023-10-02 (×2): 20 mg via ORAL
  Filled 2023-10-01 (×2): qty 1

## 2023-10-01 MED ORDER — ARFORMOTEROL TARTRATE 15 MCG/2ML IN NEBU
15.0000 ug | INHALATION_SOLUTION | Freq: Two times a day (BID) | RESPIRATORY_TRACT | Status: DC
  Administered 2023-10-01 – 2023-10-02 (×3): 15 ug via RESPIRATORY_TRACT
  Filled 2023-10-01 (×3): qty 2

## 2023-10-01 MED ORDER — ASPIRIN 81 MG PO TBEC
81.0000 mg | DELAYED_RELEASE_TABLET | Freq: Every day | ORAL | Status: DC
  Administered 2023-10-01 – 2023-10-02 (×2): 81 mg via ORAL
  Filled 2023-10-01 (×2): qty 1

## 2023-10-01 MED ORDER — ACETAMINOPHEN 325 MG PO TABS
650.0000 mg | ORAL_TABLET | Freq: Four times a day (QID) | ORAL | Status: DC | PRN
  Administered 2023-10-01 – 2023-10-02 (×4): 650 mg via ORAL
  Filled 2023-10-01 (×4): qty 2

## 2023-10-01 MED ORDER — MEXILETINE HCL 150 MG PO CAPS
150.0000 mg | ORAL_CAPSULE | Freq: Two times a day (BID) | ORAL | Status: DC
  Administered 2023-10-01: 150 mg via ORAL
  Filled 2023-10-01 (×4): qty 1

## 2023-10-01 MED ORDER — HEPARIN SODIUM (PORCINE) 5000 UNIT/ML IJ SOLN
5000.0000 [IU] | Freq: Three times a day (TID) | INTRAMUSCULAR | Status: DC
  Administered 2023-10-01 – 2023-10-02 (×4): 5000 [IU] via SUBCUTANEOUS
  Filled 2023-10-01 (×5): qty 1

## 2023-10-01 NOTE — Consult Note (Signed)
ELECTROPHYSIOLOGY CONSULT NOTE    Patient ID: Edward Mullen MRN: [id number], DOB/AGE: [birth year] 68 y.o.  Admit date: 09/30/2023 Date of Consult: 10/01/2023  Primary Physician: Loura Back, NP Primary Cardiologist: None  Electrophysiologist: Dr. Graciela Husbands   Referring Provider: Dr. Alvino Chapel   Patient Profile: Edward Mullen is a 68 y.o. male with a history of HFrEF s/p ICD, LVEF <20% 02/2023, prior PEA arrest, LBBB, CAD, HTN, HLD, COPD, DM II, cocaine / polysubstance abuse & BPH who is being seen today for the evaluation of VT at the request of Dr. Loney Loh.  HPI:  Edward Mullen is a 68 y.o. male who presented to [facility] ER on 2/18 with reports of RLE pain / numbness and dizzy episode.   The patient reported he was watching TV and tried to get up when he suddenly felt dizzy and sat back down.  He did not pass out. The episodes lasted ~ 15 seconds.  He had no other associated symptoms. The [facility] received an alert remote transmission that showed he had an episode on 2/17 at 1:44 PM of V>A conduction at 203 bpm that was successfully treated with ATP.  The episodes lasted 14 seconds in duration.    He denies chest pain, palpitations, dyspnea, PND, orthopnea, nausea, vomiting, dizziness, syncope, edema, weight gain, or early satiety.   Labs Potassium4.2 (02/19 0440) Magnesium  2.1 (02/18 1940) Creatinine, ser  1.79*, 1.57* (02/19 0440) PLT  93* (02/19 0440) HGB  13.5 (02/19 0440) WBC 5.1 (02/19 0440) Troponin I (High Sensitivity)19* (02/18 2040).    Past Medical History:  Diagnosis Date   Arthritis    "feels like it in my legs" (09/20/2014)   CAD (coronary artery disease)    Chronic kidney disease, stage 3a (HCC)    Chronic systolic CHF (congestive heart failure) (HCC)    Cocaine abuse (HCC)    Hypertension    NICM (nonischemic cardiomyopathy) (HCC)    NSVT (nonsustained ventricular tachycardia) (HCC) 09/06/2016   pre diabetes    patient denies     Surgical History:   Past Surgical History:  Procedure Laterality Date   BIV ICD INSERTION CRT-D N/A 07/17/2022   Procedure: BIV ICD INSERTION CRT-D;  Surgeon: Duke Salvia, MD;  Location: [facility] INVASIVE CV LAB;  Service: Cardiovascular;  Laterality: N/A;   CARDIAC CATHETERIZATION N/A 08/27/2016   Procedure: Right/Left Heart Cath and Coronary Angiography;  Surgeon: Laurey Morale, MD;  Location: [facility] INVASIVE CV LAB;  Service: Cardiovascular;  Laterality: N/A;   COLONOSCOPY     CYSTECTOMY Right    "back of my shoulder"   IR RADIOLOGIST EVAL & MGMT  08/27/2023   RIGHT/LEFT HEART CATH AND CORONARY ANGIOGRAPHY N/A 12/04/2020   Procedure: RIGHT/LEFT HEART CATH AND CORONARY ANGIOGRAPHY;  Surgeon: Laurey Morale, MD;  Location: [facility] INVASIVE CV LAB;  Service: Cardiovascular;  Laterality: N/A;   RIGHT/LEFT HEART CATH AND CORONARY ANGIOGRAPHY N/A 10/10/2021   Procedure: RIGHT/LEFT HEART CATH AND CORONARY ANGIOGRAPHY;  Surgeon: Laurey Morale, MD;  Location: [facility] INVASIVE CV LAB;  Service: Cardiovascular;  Laterality: N/A;   TONSILLECTOMY       (Not in a hospital admission)   Inpatient Medications:   amiodarone  200 mg Oral Daily   arformoterol  15 mcg Nebulization BID   And   umeclidinium bromide  1 puff Inhalation Daily   aspirin EC  81 mg Oral Daily   heparin  5,000 Units Subcutaneous Q8H   mexiletine  150 mg Oral BID  nortriptyline  10 mg Oral QHS   rosuvastatin  20 mg Oral Daily   tamsulosin  0.4 mg Oral QHS    Allergies: No Known Allergies  Family History  Problem Relation Age of Onset   Stroke Mother 43   Heart disease Father 33   Colon cancer Neg Hx    Colon polyps Neg Hx    Esophageal cancer Neg Hx    Stomach cancer Neg Hx    Rectal cancer Neg Hx      Physical Exam: Vitals:   10/01/23 0945 10/01/23 1000 10/01/23 1015 10/01/23 1055  BP: 114/88 104/62 (!) 107/59   Pulse: 72 67 69   Resp: 16 16 19    Temp:    98.7 F (37.1 C)  TempSrc:      SpO2: 99% 93% 95%   Weight:      Height:         GEN- NAD, A&O x 3, normal affect HEENT: Normocephalic, atraumatic Lungs- CTAB, Normal effort.  Heart- Regular rate and rhythm, No M/G/R.  GI- Soft, NT, ND.  Extremities- No clubbing, cyanosis, or edema   Radiology/Studies: DG Chest 1 View Result Date: 09/30/2023 CLINICAL DATA:  Dizziness. EXAM: CHEST  1 VIEW COMPARISON:  Chest radiograph dated 04/15/2023. FINDINGS: Stable cardiomegaly. Stable left chest wall pacemaker. Aortic atherosclerosis. No focal consolidation, pleural effusion, or pneumothorax. No acute osseous abnormality. IMPRESSION: No acute cardiopulmonary findings. Electronically Signed   By: Hart Robinsons M.D.   On: 09/30/2023 17:03   VAS [place] LOWER EXTREMITY VENOUS (DVT) (7a-7p) Result Date: 09/30/2023  Lower Venous DVT Study Patient Name:  Edward Mullen  Date of Exam:   09/30/2023 Medical Rec #: [id number]          Accession #:    [id number] Date of Birth: 1955-12-08         Patient Gender: M Patient Age:   65 years Exam Location:  [facility] Procedure:      VAS [place] LOWER EXTREMITY VENOUS (DVT) Referring Phys: Riki Sheer --------------------------------------------------------------------------------  Indications: Pain.  Risk Factors: Immobility. Comparison Study: None. Performing Technologist: Shona Simpson  Examination Guidelines: A complete evaluation includes B-mode imaging, spectral Doppler, color Doppler, and power Doppler as needed of all accessible portions of each vessel. Bilateral testing is considered an integral part of a complete examination. Limited examinations for reoccurring indications may be performed as noted. The reflux portion of the exam is performed with the patient in reverse Trendelenburg.  +---------+---------------+---------+-----------+----------+--------------+ RIGHT    CompressibilityPhasicitySpontaneityPropertiesThrombus Aging +---------+---------------+---------+-----------+----------+--------------+ CFV      Full           Yes       Yes                                 +---------+---------------+---------+-----------+----------+--------------+ SFJ      Full                                                        +---------+---------------+---------+-----------+----------+--------------+ FV Prox  Full                                                        +---------+---------------+---------+-----------+----------+--------------+  FV Mid   Full                                                        +---------+---------------+---------+-----------+----------+--------------+ FV DistalFull                                                        +---------+---------------+---------+-----------+----------+--------------+ PFV      Full                                                        +---------+---------------+---------+-----------+----------+--------------+ POP      Full           Yes      Yes                                 +---------+---------------+---------+-----------+----------+--------------+ PTV      Full                                                        +---------+---------------+---------+-----------+----------+--------------+ PERO     Full                                                        +---------+---------------+---------+-----------+----------+--------------+   +----+---------------+---------+-----------+----------+--------------+ LEFTCompressibilityPhasicitySpontaneityPropertiesThrombus Aging +----+---------------+---------+-----------+----------+--------------+ CFV Full           Yes      Yes                                 +----+---------------+---------+-----------+----------+--------------+     Summary: RIGHT: - There is no evidence of deep vein thrombosis in the lower extremity.  - No cystic structure found in the popliteal fossa.  LEFT: - No evidence of common femoral vein obstruction.   *See table(s) above for measurements and observations.  Electronically signed by Lemar Livings MD on 09/30/2023 at 4:34:13 PM.    Final     ZOX:WRUE 82 bpm (personally reviewed)  TELEMETRY: nsr with biv pacing (personally reviewed)  DEVICE HISTORY: [name], implanted 07/17/2022 for HFrEF  Assessment/Plan:  Ventricular Tachycardia  Near Syncope HFrEF / NICM  LVEF <20% 02/2023. S/p successful ATP with stim 09/30/23. Most likely due to cocaine / THC use.  -increase amiodarone 200 mg bid for 10 days and the 200 mg daily  -mexiletine 150 mg BID  -euvolemic on exam, BNP wnl -GDMT on hold due to AKI   CAD  -per [facility]  -no anginal symptoms   AKI  -per [facility]   Polysubstance Abuse  -UDS positive for THC, cocaine  -cessation  counseling       For questions or updates, please contact [facility] Please consult [email or web address] for contact info under Cardiology/STEMI.  Signed, Canary Brim, NP-C, AGACNP-BC [facility] - Electrophysiology  10/01/2023, 1:09 PM  EP Attending  Patient seen and examined. Agree with above. The patient is a pleasant 68 yo man who looks older than his stated age who presented for evaluation of VT. He has a h/o polysubstance abuse and admits to regular THC use. He denies Cocaine despite a tox screen that is positive. He has had some dizzy spells and was told by our device clinic to come to the ED for eval. On exam he is a diskempt appearing man, NAD. Lungs are clear and CV with a RRR and ext are warm and abd is scaphoid. Neuro is non-focal. Tele with NSR and biv pacing. Labs reviewed.  A/P Recurrent VT in the setting of cocaine - I would increase his amio for 10 days to 200 bid and then back to 200 mg daily.  Polysubstance abuse - he is encouraged to curtail this.  Chronic systolic heart failure - this is chronic but he does ot appear to be volume overloaded.  Disp - Ok for DC home from my perspective.   Sharlot Gowda Davyd Podgorski,MD

## 2023-10-01 NOTE — Progress Notes (Signed)
Device Check 10/01/23   [name], implanted 07/17/22 per Dr. Graciela Husbands   Presenting: AS BiV pacing Battery: 5.9 years    Episode of VT reviewed from 09/29/23 - lasts 13 seconds, CL 295 ms / 203 bpm, ATP x1 with burst at 252 ms which broke VT.    VT zones reviewed with Dr. Ladona Ridgel, appropriate.  VT-1 150 bpm > monitor  VT-2 176 bpm > ATP x2, ATPx2, 30j, 40j VF- 240 bpm, ATPx1, 36j, 40j, 40j x4     Impedence, sensing and thresholds within normal limits.  LV output programmed at 1.5V at 1ms due to diaphragmatic stimulation (today's threshold was 0.75V @ 1ms).  Episodes of mode switch <1%.    Edward Brim, NP-C, AGACNP-BC [facility] - Electrophysiology  10/01/2023, 3:37 PM

## 2023-10-01 NOTE — Progress Notes (Signed)
 [facility] Liaison Note: This patient is currently enrolled in [facility] outpatient-based palliative care.  Please call for any outpatient based palliative care related questions or concerns. Thank you, Henderson Newcomer, LPN [facility] Liaison [phone]

## 2023-10-01 NOTE — Progress Notes (Signed)
  PROGRESS NOTE  Patient admitted earlier this morning. See H&P.   Patient admitted after a near syncopal episode and his device interrogation revealed episode of sustained VT on 2/17 terminated by ATP x 1.  Cardiology consulted.  Patient seen in the emergency department.  Complains of right hip pain, which he was previously told that was arthritis.  DVT ultrasound was negative.  Had episode of hypoglycemia this afternoon, blood sugar 44 with diaphoresis.  Patient taking in p.o. juice and lunch.   -Continue amiodarone, mexiletine -Cardiology following   Status is: Observation The patient will require care spanning > 2 midnights and should be moved to inpatient because: continue monitoring    Noralee Stain, DO [facility] 10/01/2023, 12:56 PM  Available via Epic secure chat 7am-7pm After these hours, please refer to coverage provider listed on [email or web address]

## 2023-10-01 NOTE — TOC CM/SW Note (Signed)
Transition of Care [facility]) - Inpatient Brief Assessment   Patient Details  Name: Edward Mullen MRN: [id number] Date of Birth: 04-14-1956  Transition of [facility]) CM/SW Contact:    Leone Haven, RN Phone Number: 10/01/2023, 4:24 PM   Clinical Narrative: From home with daughter, has PCP and insurance on file, states has no HH services in place at this time , has a walker at home.  States daughter will transport him  home at [employer] and daughter and wife  is support system, states gets medications from [facility].  Pta self ambulatory .   Transition of Care Asessment: Insurance and Status: Insurance coverage has been reviewed Patient has primary care physician: Yes Home environment has been reviewed: home with daughter Prior level of function:: indep Prior/Current Home Services: No current home services Social Drivers of Health Review: SDOH reviewed no interventions necessary Readmission risk has been reviewed: Yes Transition of care needs: no transition of care needs at this time

## 2023-10-01 NOTE — ED Notes (Signed)
Gave patient a urinal patient has call bell in reach  

## 2023-10-01 NOTE — H&P (Signed)
History and Physical    Edward Mullen ZOX:[id number] DOB: 02-06-1956 DOA: 09/30/2023  PCP: Loura Back, NP  Patient coming from: Home  Chief Complaint: Right leg pain  HPI: Edward Mullen is a 68 y.o. male with medical history significant of CAD, CKD stage IIIa, chronic HFrEF status post ICD (EF <20% 02/2023), VT/NSVT, prior PEA arrest, chronic LBBB, COPD, hypertension, hyperlipidemia, type 2 diabetes, cocaine/polysubstance abuse, BPH presented to ED for evaluation of right leg pain x 6 months.  He also had an episode of near syncope and his device interrogation showed showed an episode of sustained VT on 2/17 terminated by ATP x 1. SBP in the 90s on arrival to the ED but remainder vital signs stable.  Labs showing no leukocytosis, normal hemoglobin, mild chronic thrombocytopenia, creatinine 2.2 (baseline around 1.5), troponin 18>19, BNP normal, magnesium level normal.  Chest x-ray showing no acute cardiopulmonary findings.  Right lower extremity Doppler ultrasound negative for DVT.  Patient was given Dilaudid and 500 mL LR.  Cardiology consulted and [facility] called to admit.    Patient states on Monday he was watching TV and when he tried to get up, all of a sudden he felt dizzy so he sat back down.  He felt like he was going to pass out but did not lose consciousness.  This lasted about 15 seconds.  He was not having any chest pain or shortness of breath at that time.  He has not had any further symptoms since that episode on Monday.  Denies current cocaine use and reports compliance with his home medications.  He reports chronic right hip pain and states he was seen in the emergency room in November and was told that he had arthritis of his hip.  Denies any falls or trauma to his hip.  No difficulty walking.  He also reports history of chronic prostate problems and is supposed to have a procedure done by urology later this month.  No other complaints.  Review of Systems:  ROS  Past Medical  History:  Diagnosis Date   Arthritis    "feels like it in my legs" (09/20/2014)   CAD (coronary artery disease)    Chronic kidney disease, stage 3a (HCC)    Chronic systolic CHF (congestive heart failure) (HCC)    Cocaine abuse (HCC)    Hypertension    NICM (nonischemic cardiomyopathy) (HCC)    NSVT (nonsustained ventricular tachycardia) (HCC) 09/06/2016   pre diabetes    patient denies    Past Surgical History:  Procedure Laterality Date   BIV ICD INSERTION CRT-D N/A 07/17/2022   Procedure: BIV ICD INSERTION CRT-D;  Surgeon: Duke Salvia, MD;  Location: [facility] INVASIVE CV LAB;  Service: Cardiovascular;  Laterality: N/A;   CARDIAC CATHETERIZATION N/A 08/27/2016   Procedure: Right/Left Heart Cath and Coronary Angiography;  Surgeon: Laurey Morale, MD;  Location: [facility] INVASIVE CV LAB;  Service: Cardiovascular;  Laterality: N/A;   COLONOSCOPY     CYSTECTOMY Right    "back of my shoulder"   IR RADIOLOGIST EVAL & MGMT  08/27/2023   RIGHT/LEFT HEART CATH AND CORONARY ANGIOGRAPHY N/A 12/04/2020   Procedure: RIGHT/LEFT HEART CATH AND CORONARY ANGIOGRAPHY;  Surgeon: Laurey Morale, MD;  Location: [facility] INVASIVE CV LAB;  Service: Cardiovascular;  Laterality: N/A;   RIGHT/LEFT HEART CATH AND CORONARY ANGIOGRAPHY N/A 10/10/2021   Procedure: RIGHT/LEFT HEART CATH AND CORONARY ANGIOGRAPHY;  Surgeon: Laurey Morale, MD;  Location: [facility] INVASIVE CV LAB;  Service: Cardiovascular;  Laterality:  N/A;   TONSILLECTOMY       reports that he has been smoking cigarettes. He has a 4.8 pack-year smoking history. He has never used smokeless tobacco. He reports current alcohol use of about 1.0 standard drink of alcohol per week. He reports current drug use. Drug: Marijuana.  No Known Allergies  Family History  Problem Relation Age of Onset   Stroke Mother 66   Heart disease Father 87   Colon cancer Neg Hx    Colon polyps Neg Hx    Esophageal cancer Neg Hx    Stomach cancer Neg Hx    Rectal cancer Neg Hx      Prior to Admission medications   Medication Sig Start Date End Date Taking? Authorizing Provider  albuterol (VENTOLIN HFA) 108 (90 Base) MCG/ACT inhaler INHALE 1 TO 2 PUFFS BY MOUTH EVERY 6 HOURS AS NEEDED FOR WHEEZING OR SHORTNESS OF BREATH 10/29/22  Yes Morene Crocker, MD  amiodarone (PACERONE) 200 MG tablet Take 1 tablet (200 mg total) by mouth daily. 11/26/22  Yes Zannie Cove, MD  ASPIRIN LOW DOSE 81 MG tablet TAKE 1 TABLET (81 MG TOTAL) BY MOUTH DAILY. SWALLOW WHOLE(AM) 01/27/23  Yes Milford, Anderson Malta, FNP  carvedilol (COREG) 3.125 MG tablet Take 1 tablet (3.125 mg total) by mouth 2 (two) times daily. 01/29/22 03/25/24 Yes Milford, Anderson Malta, FNP  Cholecalciferol (VITAMIN D3) 50 MCG (2000 UT) TABS Take 2,000 Units by mouth daily.   Yes [provider]  diphenhydramine-acetaminophen (TYLENOL PM) 25-500 MG TABS tablet Take 2 tablets by mouth at bedtime as needed (for sleep).   Yes [provider]  gabapentin (NEURONTIN) 100 MG capsule Take 2 capsules (200 mg total) by mouth 3 (three) times daily. 12/16/22 03/25/24 Yes Rocky Morel, DO  gabapentin (NEURONTIN) 600 MG tablet Take 600 mg by mouth 2 (two) times daily.   Yes [provider]  JARDIANCE 10 MG TABS tablet Take 10 mg by mouth daily. 04/03/23  Yes [provider]  losartan (COZAAR) 25 MG tablet Take 25 mg by mouth daily.   Yes [provider]  mexiletine (MEXITIL) 150 MG capsule TAKE 1 CAPSULE BY MOUTH 2 (TWO) TIMES DAILY (AM+BEDTIME) 07/29/23  Yes Duke Salvia, MD  nortriptyline (PAMELOR) 10 MG capsule Take 10 mg by mouth at bedtime.   Yes [provider]  rosuvastatin (CRESTOR) 20 MG tablet Take 1 tablet (20 mg total) by mouth daily. 07/25/23  Yes Laurey Morale, MD  spironolactone (ALDACTONE) 25 MG tablet Take 0.5 tablets (12.5 mg total) by mouth daily. 07/25/23  Yes Laurey Morale, MD  STIOLTO RESPIMAT 2.5-2.5 MCG/ACT AERS Inhale 2 puffs into the lungs daily.  06/11/23  Yes [provider]  tamsulosin (FLOMAX) 0.4 MG CAPS capsule Take 0.4 mg by mouth at bedtime.   Yes [provider]  torsemide (DEMADEX) 20 MG tablet Take 1 tablet (20 mg total) by mouth daily. 08/08/23  Yes Laurey Morale, MD  acetaminophen (TYLENOL) 500 MG tablet Take 2 tablets (1,000 mg total) by mouth every 8 (eight) hours as needed. Patient not taking: Reported on 09/30/2023 12/16/22   Rocky Morel, DO  albuterol (PROVENTIL) (2.5 MG/3ML) 0.083% nebulizer solution USE ONE VIAL (2.5 MG TOTAL) BY NEBULIZATION EVERY 6 (SIX) HOURS AS NEEDED FOR SHORTNESS OF BREATH OR WHEEZING. Patient not taking: Reported on 09/30/2023 10/29/22   Morene Crocker, MD  zolpidem (AMBIEN) 10 MG tablet Take 1 tablet (10 mg total) by mouth at bedtime as needed for sleep.  09/16/22 01/03/23      Physical Exam: Vitals:   09/30/23 2200 09/30/23 2221 09/30/23 2230 09/30/23 2245  BP: 108/78  117/80 114/80  Pulse: 64  64 62  Resp: 15  19 19   Temp:  98.4 F (36.9 C)    TempSrc:  Oral    SpO2: 99%  99% 97%  Weight:      Height:        Physical Exam  Labs on Admission: I have personally reviewed following labs and imaging studies  CBC: Recent Labs  Lab 09/30/23 1432  WBC 5.8  HGB 14.4  HCT 44.9  MCV 88.4  PLT 114*   Basic Metabolic Panel: Recent Labs  Lab 09/30/23 1432 09/30/23 1940  NA 138  --   K 4.2  --   CL 103  --   CO2 22  --   GLUCOSE 108*  --   BUN 42*  --   CREATININE 2.27*  --   CALCIUM 9.8  --   MG  --  2.1   GFR: Estimated Creatinine Clearance: 33.7 mL/min (A) (by C-G formula based on SCr of 2.27 mg/dL (H)). Liver Function Tests: Recent Labs  Lab 09/25/23 0956  AST 35  ALT 29  ALKPHOS 123  BILITOT 0.8  PROT 8.5*  ALBUMIN 3.7   No results for input(s): "LIPASE", "AMYLASE" in the last 168 hours. No results for input(s): "AMMONIA" in the last 168 hours. Coagulation Profile: No results for input(s): "INR", "PROTIME" in the last 168  hours. Cardiac Enzymes: No results for input(s): "CKTOTAL", "CKMB", "CKMBINDEX", "TROPONINI" in the last 168 hours. BNP (last 3 results) No results for input(s): "PROBNP" in the last 8760 hours. HbA1C: No results for input(s): "HGBA1C" in the last 72 hours. CBG: No results for input(s): "GLUCAP" in the last 168 hours. Lipid Profile: No results for input(s): "CHOL", "HDL", "LDLCALC", "TRIG", "CHOLHDL", "LDLDIRECT" in the last 72 hours. Thyroid Function Tests: No results for input(s): "TSH", "T4TOTAL", "FREET4", "T3FREE", "THYROIDAB" in the last 72 hours. Anemia Panel: No results for input(s): "VITAMINB12", "FOLATE", "FERRITIN", "TIBC", "IRON", "RETICCTPCT" in the last 72 hours. Urine analysis:    Component Value Date/Time   COLORURINE YELLOW 07/07/2023 1449   APPEARANCEUR CLOUDY (A) 07/07/2023 1449   APPEARANCEUR Clear 12/16/2022 1150   LABSPEC 1.010 07/07/2023 1449   PHURINE 7.0 07/07/2023 1449   GLUCOSEU 50 (A) 07/07/2023 1449   HGBUR SMALL (A) 07/07/2023 1449   BILIRUBINUR NEGATIVE 07/07/2023 1449   BILIRUBINUR Negative 12/16/2022 1150   KETONESUR NEGATIVE 07/07/2023 1449   PROTEINUR 30 (A) 07/07/2023 1449   UROBILINOGEN 0.2 12/31/2010 1003   NITRITE NEGATIVE 07/07/2023 1449   LEUKOCYTESUR NEGATIVE 07/07/2023 1449    Radiological Exams on Admission: DG Chest 1 View Result Date: 09/30/2023 CLINICAL DATA:  Dizziness. EXAM: CHEST  1 VIEW COMPARISON:  Chest radiograph dated 04/15/2023. FINDINGS: Stable cardiomegaly. Stable left chest wall pacemaker. Aortic atherosclerosis. No focal consolidation, pleural effusion, or pneumothorax. No acute osseous abnormality. IMPRESSION: No acute cardiopulmonary findings. Electronically Signed   By: Hart Robinsons M.D.   On: 09/30/2023 17:03   VAS [place] LOWER EXTREMITY VENOUS (DVT) (7a-7p) Result Date: 09/30/2023  Lower Venous DVT Study Patient Name:  ADRICK KESTLER  Date of Exam:   09/30/2023 Medical Rec #: [id number]          Accession #:     [id number] Date of Birth: 01/18/1956         Patient Gender: M Patient Age:   69 years  Exam Location:  [facility] Procedure:      VAS [place] LOWER EXTREMITY VENOUS (DVT) Referring Phys: Riki Sheer --------------------------------------------------------------------------------  Indications: Pain.  Risk Factors: Immobility. Comparison Study: None. Performing Technologist: Shona Simpson  Examination Guidelines: A complete evaluation includes B-mode imaging, spectral Doppler, color Doppler, and power Doppler as needed of all accessible portions of each vessel. Bilateral testing is considered an integral part of a complete examination. Limited examinations for reoccurring indications may be performed as noted. The reflux portion of the exam is performed with the patient in reverse Trendelenburg.  +---------+---------------+---------+-----------+----------+--------------+ RIGHT    CompressibilityPhasicitySpontaneityPropertiesThrombus Aging +---------+---------------+---------+-----------+----------+--------------+ CFV      Full           Yes      Yes                                 +---------+---------------+---------+-----------+----------+--------------+ SFJ      Full                                                        +---------+---------------+---------+-----------+----------+--------------+ FV Prox  Full                                                        +---------+---------------+---------+-----------+----------+--------------+ FV Mid   Full                                                        +---------+---------------+---------+-----------+----------+--------------+ FV DistalFull                                                        +---------+---------------+---------+-----------+----------+--------------+ PFV      Full                                                         +---------+---------------+---------+-----------+----------+--------------+ POP      Full           Yes      Yes                                 +---------+---------------+---------+-----------+----------+--------------+ PTV      Full                                                        +---------+---------------+---------+-----------+----------+--------------+ PERO     Full                                                        +---------+---------------+---------+-----------+----------+--------------+   +----+---------------+---------+-----------+----------+--------------+  LEFTCompressibilityPhasicitySpontaneityPropertiesThrombus Aging +----+---------------+---------+-----------+----------+--------------+ CFV Full           Yes      Yes                                 +----+---------------+---------+-----------+----------+--------------+     Summary: RIGHT: - There is no evidence of deep vein thrombosis in the lower extremity.  - No cystic structure found in the popliteal fossa.  LEFT: - No evidence of common femoral vein obstruction.   *See table(s) above for measurements and observations. Electronically signed by Lemar Livings MD on 09/30/2023 at 4:34:13 PM.    Final     EKG: Independently reviewed. Interpretation limited secondary to paced rhythm.   Assessment and Plan  Near syncope in the setting of sustained VT Per cardiology, his device interrogation showed sustained VT on 2/17 for approximately 8 seconds with cycle length of 295 ms, terminated by ATP x 1.  Also showed episodes of nonsustained VT. Patient is currently asymptomatic.  He reports compliance with his home medications.  Electrolytes are normal.  Cardiology feels that the etiology is likely related to heart mediated VT in the setting of persistent nonischemic cardiomyopathy versus possible substance abuse.  Recommended making no changes to his amiodarone and mexiletine at this time.  Continue amiodarone  200 mg daily and mexiletine 150 mg twice daily. If he develops sustained VT, discontinue mexiletine and start lidocaine drip.  Holding metoprolol at this time due to soft blood pressure, SBP in the 90s.  EP will see the patient in the morning. Defer decision regarding cardiac MRI to cardiology team.  Continue to monitor electrolytes.  UDS ordered.  AKI on CKD stage IIIa Likely prerenal in etiology as blood pressure is soft and takes ARB and diuretics at home.  Creatinine 2.2, baseline around 1.5.  Continue gentle IV fluid hydration and monitor labs.  Avoid nephrotoxic agents/hold home losartan, spironolactone, and torsemide.  Chronic right hip pain No leg swelling and Doppler ultrasound negative for DVT.  Patient is endorsing chronic right hip pain.  Denies any falls or trauma to his hip.  No difficulty walking.  X-ray done in November 2024 showing moderate degenerative changes.  Nonischemic cardiomyopathy (EF <20%) status post [facility] CRT-D No signs of volume overload at this time, BNP normal.  Holding ARB and diuretics in the setting of AKI.  Also blood pressure soft so hold metoprolol at this time.  Continue to monitor volume status closely.  CAD Workup not suggestive of ACS.  Cardiology consulting.  Continue aspirin and Crestor.  COPD Stable, no signs of acute exacerbation.  Continue home inhalers.  Depression Continue nortriptyline.  BPH Continue Flomax.  DVT prophylaxis: SQ Heparin Code Status: Full Code (discussed with the patient) Level of care: Progressive Care Unit Admission status: It is my clinical opinion that referral for OBSERVATION is reasonable and necessary in this patient based on the above information provided. The aforementioned taken together are felt to place the patient at high risk for further clinical deterioration. However, it is anticipated that the patient may be medically stable for discharge from the hospital within 24 to 48 hours.  John Giovanni  MD [facility]  If 7PM-7AM, please contact night-coverage [email or web address]  10/01/2023, 1:22 AM

## 2023-10-01 NOTE — ED Notes (Signed)
Food and fluids given, tolerated well

## 2023-10-01 NOTE — ED Notes (Signed)
Pt alerted this to a "hot flash" pt is diaphoretic denies chest pain at this time. Provider made aware

## 2023-10-01 NOTE — Care Management Obs Status (Signed)
MEDICARE OBSERVATION STATUS NOTIFICATION   Patient Details  Name: Edward Mullen MRN: [id number] Date of Birth: 09-05-1955   Medicare Observation Status Notification Given:       Oletta Cohn, RN 10/01/2023, 2:13 PM

## 2023-10-02 ENCOUNTER — Other Ambulatory Visit (HOSPITAL_COMMUNITY): Payer: Self-pay

## 2023-10-02 ENCOUNTER — Other Ambulatory Visit: Payer: Self-pay | Admitting: Internal Medicine

## 2023-10-02 DIAGNOSIS — N1831 Chronic kidney disease, stage 3a: Secondary | ICD-10-CM | POA: Insufficient documentation

## 2023-10-02 DIAGNOSIS — R55 Syncope and collapse: Secondary | ICD-10-CM | POA: Diagnosis not present

## 2023-10-02 DIAGNOSIS — E11649 Type 2 diabetes mellitus with hypoglycemia without coma: Secondary | ICD-10-CM | POA: Insufficient documentation

## 2023-10-02 LAB — BASIC METABOLIC PANEL
Anion gap: 9 (ref 5–15)
BUN: 26 mg/dL — ABNORMAL HIGH (ref 8–23)
CO2: 23 mmol/L (ref 22–32)
Calcium: 9.5 mg/dL (ref 8.9–10.3)
Chloride: 106 mmol/L (ref 98–111)
Creatinine, Ser: 1.42 mg/dL — ABNORMAL HIGH (ref 0.61–1.24)
GFR, Estimated: 53 mL/min — ABNORMAL LOW (ref >60)
Glucose, Bld: 88 mg/dL (ref 70–99)
Potassium: 4.6 mmol/L (ref 3.5–5.1)
Sodium: 138 mmol/L (ref 135–145)

## 2023-10-02 LAB — GLUCOSE, CAPILLARY: Glucose-Capillary: 80 mg/dL (ref 70–99)

## 2023-10-02 MED ORDER — BLOOD GLUCOSE TEST VI STRP: 1.0000 | ORAL_STRIP | Freq: Three times a day (TID) | 0 refills | Status: AC

## 2023-10-02 MED ORDER — LANCET DEVICE MISC
1.0000 | Freq: Three times a day (TID) | 0 refills | Status: AC
Start: 2023-10-02 — End: 2023-11-01

## 2023-10-02 MED ORDER — BLOOD GLUCOSE MONITORING SUPPL DEVI: 1.0000 | Freq: Three times a day (TID) | 0 refills | Status: DC

## 2023-10-02 MED ORDER — AMIODARONE HCL 200 MG PO TABS: 200.0000 mg | ORAL_TABLET | Freq: Two times a day (BID) | ORAL | 0 refills | Status: DC

## 2023-10-02 MED ORDER — AMIODARONE HCL 200 MG PO TABS
200.0000 mg | ORAL_TABLET | Freq: Two times a day (BID) | ORAL | Status: DC
Start: 2023-10-02 — End: 2023-10-02
  Administered 2023-10-02: 200 mg via ORAL
  Filled 2023-10-02: qty 1

## 2023-10-02 MED ORDER — LANCETS MISC. MISC: 1.0000 | Freq: Three times a day (TID) | 0 refills | Status: AC

## 2023-10-02 NOTE — Progress Notes (Signed)
Mobility Specialist Progress Note:   10/02/23 1020  Mobility  Activity Stood at bedside  Level of Assistance Contact guard assist, steadying assist  Assistive Device Other (Comment) (HHA)  Distance Ambulated (ft) 2 ft  Activity Response Tolerated fair  Mobility Referral Yes  Mobility visit 1 Mobility  Mobility Specialist Start Time (ACUTE ONLY) 1020  Mobility Specialist Stop Time (ACUTE ONLY) 1030  Mobility Specialist Time Calculation (min) (ACUTE ONLY) 10 min   Pt agreeable to mobility session. Required CGA via HHA to stand EOB and march in place. Pt able to take a couple short steps at EOB as well. Session significantly limited by RLE pain/back pain. RN aware. Pt back in bed with all needs met, eager for d/c.  Addison Lank Mobility Specialist Please contact via SecureChat or  Rehab office at [phone]

## 2023-10-02 NOTE — Telephone Encounter (Signed)
With this as an isolated event-- the first therapy since implant Would do nothing different Thanks SK

## 2023-10-02 NOTE — TOC Transition Note (Signed)
Transition of Care [facility]) - Discharge Note   Patient Details  Name: Edward Mullen MRN: [id number] Date of Birth: 11-04-1955  [facility] of [facility]) CM/SW Contact:  Leone Haven, RN Phone Number: 10/02/2023, 10:58 AM   Clinical Narrative:    For dc home , no needs.         Patient Goals and CMS Choice            Discharge Placement                       Discharge Plan and Services Additional resources added to the After Visit Summary for                                       Social Drivers of Health (SDOH) Interventions SDOH Screenings   Food Insecurity: No Food Insecurity (10/01/2023)  Housing: Unknown (10/01/2023)  Transportation Needs: Unmet Transportation Needs (10/01/2023)  Utilities: Not At Risk (10/01/2023)  Alcohol Screen: Low Risk  (06/17/2022)  Depression (PHQ2-9): Low Risk  (12/04/2022)  Financial Resource Strain: Low Risk  (06/17/2022)  Physical Activity: Inactive (06/17/2022)  Social Connections: Unknown (10/02/2023)  Stress: No Stress Concern Present (06/17/2022)  Tobacco Use: High Risk (09/30/2023)     Readmission Risk Interventions    03/12/2023   12:00 PM  Readmission Risk Prevention Plan  Transportation Screening Complete  PCP or Specialist Appt within 3-5 Days Complete  HRI or Home Care Consult Complete  Palliative Care Screening Not Applicable  Medication Review (RN Care Manager) Complete

## 2023-10-02 NOTE — Discharge Summary (Addendum)
Physician Discharge Summary  Edward Mullen:[id number] DOB: 27-Aug-1955 DOA: 09/30/2023  PCP: Loura Back, NP  Admit date: 09/30/2023 Discharge date: 10/02/2023  Admitted From: home Disposition:  home  Recommendations for Outpatient Follow-up:  Follow up with PCP in 1 week Follow up with Cardiology   Discharge Condition: Stable CODE STATUS: Full  Diet recommendation: Heart healthy   Brief/Interim Summary: From H&P by Dr. Loney Loh: "Edward Mullen is a 68 y.o. male with medical history significant of CAD, CKD stage IIIa, chronic HFrEF status post ICD (EF <20% 02/2023), VT/NSVT, prior PEA arrest, chronic LBBB, COPD, hypertension, hyperlipidemia, type 2 diabetes, cocaine/polysubstance abuse, BPH presented to ED for evaluation of right leg pain x 6 months.  He also had an episode of near syncope and his device interrogation showed showed an episode of sustained VT on 2/17 terminated by ATP x 1. SBP in the 90s on arrival to the ED but remainder vital signs stable.  Labs showing no leukocytosis, normal hemoglobin, mild chronic thrombocytopenia, creatinine 2.2 (baseline around 1.5), troponin 18>19, BNP normal, magnesium level normal.  Chest x-ray showing no acute cardiopulmonary findings.  Right lower extremity Doppler ultrasound negative for DVT.  Patient was given Dilaudid and 500 mL LR.  Cardiology consulted and [facility] called to admit.     Patient states on Monday he was watching TV and when he tried to get up, all of a sudden he felt dizzy so he sat back down.  He felt like he was going to pass out but did not lose consciousness.  This lasted about 15 seconds.  He was not having any chest pain or shortness of breath at that time.  He has not had any further symptoms since that episode on Monday.  Denies current cocaine use and reports compliance with his home medications.  He reports chronic right hip pain and states he was seen in the emergency room in November and was told that he had arthritis  of his hip.  Denies any falls or trauma to his hip.  No difficulty walking.  He also reports history of chronic prostate problems and is supposed to have a procedure done by urology later this month.  No other complaints."  Patient was evaluated by cardiology.  They recommended increasing amiodarone to 200 mg twice daily for 10 days, then back to 200 mg daily.  He also had episode of hypoglycemic event in the hospital, which resolved.  Discussed with patient hypoglycemic protocol, he normally runs with his blood sugars in the 80s.   Discharge Diagnoses:   Principal Problem:   Near syncope Active Problems:   AKI (acute kidney injury) (HCC)   Polysubstance abuse (HCC)   Non-ischemic cardiomyopathy (HCC)   VT (ventricular tachycardia) (HCC)   Herpes zoster dermatitis   Chronic hip pain, right   CKD stage 3a, GFR 45-59 ml/min (HCC)   Uncontrolled type 2 diabetes mellitus with hypoglycemia, without long-term current use of insulin (HCC)  Discharge Instructions  Discharge Instructions     Call MD for:  difficulty breathing, headache or visual disturbances   Complete by: As directed    Call MD for:  extreme fatigue   Complete by: As directed    Call MD for:  persistant dizziness or light-headedness   Complete by: As directed    Call MD for:  persistant nausea and vomiting   Complete by: As directed    Call MD for:  severe uncontrolled pain   Complete by: As directed  Call MD for:  temperature >100.4   Complete by: As directed    Diet - low sodium heart healthy   Complete by: As directed    Discharge instructions   Complete by: As directed    You were cared for by a hospitalist during your hospital stay. If you have any questions about your discharge medications or the care you received while you were in the hospital after you are discharged, you can call the unit and ask to speak with the hospitalist on call if the hospitalist that took care of you is not available. Once you are  discharged, your primary care physician will handle any further medical issues. Please note that NO REFILLS for any discharge medications will be authorized once you are discharged, as it is imperative that you return to your primary care physician (or establish a relationship with a primary care physician if you do not have one) for your aftercare needs so that they can reassess your need for medications and monitor your lab values.   Increase activity slowly   Complete by: As directed       Allergies as of 10/02/2023   No Known Allergies      Medication List     TAKE these medications    albuterol 108 (90 Base) MCG/ACT inhaler Commonly known as: VENTOLIN HFA INHALE 1 TO 2 PUFFS BY MOUTH EVERY 6 HOURS AS NEEDED FOR WHEEZING OR SHORTNESS OF BREATH   amiodarone 200 MG tablet Commonly known as: PACERONE Take 1 tablet (200 mg total) by mouth 2 (two) times daily for 10 days. Then resume 200mg  once daily. What changed:  when to take this additional instructions   Aspirin Low Dose 81 MG tablet Generic drug: aspirin EC TAKE 1 TABLET (81 MG TOTAL) BY MOUTH DAILY. SWALLOW WHOLE(AM)   Blood Glucose Monitoring Suppl Devi 1 each by Does not apply route in the morning, at noon, and at bedtime. May substitute to any manufacturer covered by patient's insurance.   BLOOD GLUCOSE TEST STRIPS Strp 1 each by In Vitro route in the morning, at noon, and at bedtime. May substitute to any manufacturer covered by patient's insurance.   carvedilol 3.125 MG tablet Commonly known as: Coreg Take 1 tablet (3.125 mg total) by mouth 2 (two) times daily.   diphenhydramine-acetaminophen 25-500 MG Tabs tablet Commonly known as: TYLENOL PM Take 2 tablets by mouth at bedtime as needed (for sleep).   gabapentin 600 MG tablet Commonly known as: NEURONTIN Take 600 mg by mouth 2 (two) times daily.   gabapentin 100 MG capsule Commonly known as: Neurontin Take 2 capsules (200 mg total) by mouth 3 (three)  times daily.   Jardiance 10 MG Tabs tablet Generic drug: empagliflozin Take 10 mg by mouth daily.   Lancet Device Misc 1 each by Does not apply route in the morning, at noon, and at bedtime. May substitute to any manufacturer covered by patient's insurance.   Lancets Misc. Misc 1 each by Does not apply route in the morning, at noon, and at bedtime. May substitute to any manufacturer covered by patient's insurance.   losartan 25 MG tablet Commonly known as: COZAAR Take 25 mg by mouth daily.   mexiletine 150 MG capsule Commonly known as: MEXITIL TAKE 1 CAPSULE BY MOUTH 2 (TWO) TIMES DAILY (AM+BEDTIME)   nortriptyline 10 MG capsule Commonly known as: PAMELOR Take 10 mg by mouth at bedtime.   rosuvastatin 20 MG tablet Commonly known as: CRESTOR Take 1 tablet (20  mg total) by mouth daily.   spironolactone 25 MG tablet Commonly known as: ALDACTONE Take 0.5 tablets (12.5 mg total) by mouth daily.   Stiolto Respimat 2.5-2.5 MCG/ACT Aers Generic drug: Tiotropium Bromide-Olodaterol Inhale 2 puffs into the lungs daily.   tamsulosin 0.4 MG Caps capsule Commonly known as: FLOMAX Take 0.4 mg by mouth at bedtime.   torsemide 20 MG tablet Commonly known as: DEMADEX Take 1 tablet (20 mg total) by mouth daily.   Vitamin D3 50 MCG (2000 UT) Tabs Take 2,000 Units by mouth daily.        Follow-up Information     Loura Back, NP Follow up.   Specialty: Nurse Practitioner Contact information: [address] [place] [address] [phone]         Laurey Morale, MD Follow up.   Specialty: Cardiology Contact information: [address] [place] [address] [phone]                No Known Allergies  Consultations: Cardiology/EP    Procedures/Studies: DG Chest 1 View Result Date: 09/30/2023 CLINICAL DATA:  Dizziness. EXAM: CHEST  1 VIEW COMPARISON:  Chest radiograph dated 04/15/2023. FINDINGS: Stable cardiomegaly. Stable left  chest wall pacemaker. Aortic atherosclerosis. No focal consolidation, pleural effusion, or pneumothorax. No acute osseous abnormality. IMPRESSION: No acute cardiopulmonary findings. Electronically Signed   By: Hart Robinsons M.D.   On: 09/30/2023 17:03   VAS [place] LOWER EXTREMITY VENOUS (DVT) (7a-7p) Result Date: 09/30/2023  Lower Venous DVT Study Patient Name:  HARLO FABELA  Date of Exam:   09/30/2023 Medical Rec #: [id number]          Accession #:    [id number] Date of Birth: [birth year]         Patient Gender: M Patient Age:   20 years Exam Location[facility]  [facility] Procedure:      VAS [place] LOWER EXTREMITY VENOUS (DVT) Referring Phys: Riki Sheer --------------------------------------------------------------------------------  Indications: Pain.  Risk Factors: Immobility. Comparison Study: None. Performing Technologist: Shona Simpson  Examination Guidelines: A complete evaluation includes B-mode imaging, spectral Doppler, color Doppler, and power Doppler as needed of all accessible portions of each vessel. Bilateral testing is considered an integral part of a complete examination. Limited examinations for reoccurring indications may be performed as noted. The reflux portion of the exam is performed with the patient in reverse Trendelenburg.  +---------+---------------+---------+-----------+----------+--------------+ RIGHT    CompressibilityPhasicitySpontaneityPropertiesThrombus Aging +---------+---------------+---------+-----------+----------+--------------+ CFV      Full           Yes      Yes                                 +---------+---------------+---------+-----------+----------+--------------+ SFJ      Full                                                        +---------+---------------+---------+-----------+----------+--------------+ FV Prox  Full                                                         +---------+---------------+---------+-----------+----------+--------------+  FV Mid   Full                                                        +---------+---------------+---------+-----------+----------+--------------+ FV DistalFull                                                        +---------+---------------+---------+-----------+----------+--------------+ PFV      Full                                                        +---------+---------------+---------+-----------+----------+--------------+ POP      Full           Yes      Yes                                 +---------+---------------+---------+-----------+----------+--------------+ PTV      Full                                                        +---------+---------------+---------+-----------+----------+--------------+ PERO     Full                                                        +---------+---------------+---------+-----------+----------+--------------+   +----+---------------+---------+-----------+----------+--------------+ LEFTCompressibilityPhasicitySpontaneityPropertiesThrombus Aging +----+---------------+---------+-----------+----------+--------------+ CFV Full           Yes      Yes                                 +----+---------------+---------+-----------+----------+--------------+     Summary: RIGHT: - There is no evidence of deep vein thrombosis in the lower extremity.  - No cystic structure found in the popliteal fossa.  LEFT: - No evidence of common femoral vein obstruction.   *See table(s) above for measurements and observations. Electronically signed by Lemar Livings MD on 09/30/2023 at 4:34:13 PM.    Final        Discharge Exam: Vitals:   10/02/23 0500 10/02/23 0719  BP: 102/73 105/81  Pulse: 72 74  Resp: 18 16  Temp: 98.2 F (36.8 C) 97.7 F (36.5 C)  SpO2: 99% 98%    General: Pt is alert, awake, not in acute distress Cardiovascular: RRR, S1/S2 +, no  edema Respiratory: CTA bilaterally, no wheezing, no rhonchi, no respiratory distress, no conversational dyspnea  Abdominal: Soft, NT, ND, bowel sounds + Extremities: no edema, no cyanosis Psych: Normal mood and affect, stable judgement and insight     The results of significant diagnostics from this hospitalization (including imaging, microbiology, ancillary and laboratory) are  listed below for reference.     Microbiology: No results found for this or any previous visit (from the past 240 hours).   Labs: BNP (last 3 results) Recent Labs    04/15/23 1437 07/25/23 1044 09/30/23 1940  BNP 66.0 1,055.4* 83.8   Basic Metabolic Panel: Recent Labs  Lab 09/30/23 1432 09/30/23 1940 10/01/23 0440 10/02/23 0135  NA 138  --  136 138  K 4.2  --  4.2 4.6  CL 103  --  105 106  CO2 22  --  21* 23  GLUCOSE 108*  --  77 88  BUN 42*  --  40* 26*  CREATININE 2.27*  --  1.79*  1.57* 1.42*  CALCIUM 9.8  --  8.9 9.5  MG  --  2.1  --   --    Liver Function Tests: No results for input(s): "AST", "ALT", "ALKPHOS", "BILITOT", "PROT", "ALBUMIN" in the last 168 hours. No results for input(s): "LIPASE", "AMYLASE" in the last 168 hours. No results for input(s): "AMMONIA" in the last 168 hours. CBC: Recent Labs  Lab 09/30/23 1432 10/01/23 0440  WBC 5.8 5.1  HGB 14.4 13.5  HCT 44.9 42.4  MCV 88.4 89.5  PLT 114* 93*   Cardiac Enzymes: No results for input(s): "CKTOTAL", "CKMB", "CKMBINDEX", "TROPONINI" in the last 168 hours. BNP: Invalid input(s): "POCBNP" CBG: Recent Labs  Lab 10/01/23 1256 10/01/23 1408 10/01/23 1528 10/01/23 2151 10/02/23 0608  GLUCAP 44* 117* 84 72 80   D-Dimer No results for input(s): "DDIMER" in the last 72 hours. Hgb A1c No results for input(s): "HGBA1C" in the last 72 hours. Lipid Profile No results for input(s): "CHOL", "HDL", "LDLCALC", "TRIG", "CHOLHDL", "LDLDIRECT" in the last 72 hours. Thyroid function studies No results for input(s): "TSH",  "T4TOTAL", "T3FREE", "THYROIDAB" in the last 72 hours.  Invalid input(s): "FREET3" Anemia work up No results for input(s): "VITAMINB12", "FOLATE", "FERRITIN", "TIBC", "IRON", "RETICCTPCT" in the last 72 hours. Urinalysis    Component Value Date/Time   COLORURINE YELLOW 07/07/2023 1449   APPEARANCEUR CLOUDY (A) 07/07/2023 1449   APPEARANCEUR Clear 12/16/2022 1150   LABSPEC 1.010 07/07/2023 1449   PHURINE 7.0 07/07/2023 1449   GLUCOSEU 50 (A) 07/07/2023 1449   HGBUR SMALL (A) 07/07/2023 1449   BILIRUBINUR NEGATIVE 07/07/2023 1449   BILIRUBINUR Negative 12/16/2022 1150   KETONESUR NEGATIVE 07/07/2023 1449   PROTEINUR 30 (A) 07/07/2023 1449   UROBILINOGEN 0.2 12/31/2010 1003   NITRITE NEGATIVE 07/07/2023 1449   LEUKOCYTESUR NEGATIVE 07/07/2023 1449   Sepsis Labs Recent Labs  Lab 09/30/23 1432 10/01/23 0440  WBC 5.8 5.1   Microbiology No results found for this or any previous visit (from the past 240 hours).   Patient was seen and examined on the day of discharge and was found to be in stable condition. Time coordinating discharge: 25 minutes including assessment and coordination of care, as well as examination of the patient.   SIGNED:  Noralee Stain, DO [facility] 10/02/2023, 10:22 AM

## 2023-10-02 NOTE — Plan of Care (Signed)

## 2023-10-03 ENCOUNTER — Telehealth: Payer: Self-pay | Admitting: Cardiology

## 2023-10-03 NOTE — Telephone Encounter (Signed)
   Patient called cardiology answering service this morning with concern about morning blood glucose of 204. He was just discharged from the hospital yesterday. Per notes, had an episode of hypoglycemia during his admission. Discussed symptoms this morning with patient, says he is a little diaphoretic but otherwise feeling okay.  I encouraged patient to continue monitoring symptoms and reach out to his PCP. Discussed need for acute evaluation in the ED if he develops acute low blood sugar.   Perlie Gold, PA-C

## 2023-10-06 ENCOUNTER — Telehealth: Payer: Self-pay | Admitting: Pharmacy Technician

## 2023-10-06 ENCOUNTER — Other Ambulatory Visit (HOSPITAL_COMMUNITY): Payer: Self-pay

## 2023-10-06 NOTE — Telephone Encounter (Signed)
 Pharmacy Patient Advocate Encounter  Received notification from [facility] that Prior Authorization for MEXILETINE has been APPROVED from 08/13/23 to 08/11/24. Spoke to pharmacy to process.Copay is $0.00.    PA #/Case ID/Reference #: [id number]

## 2023-10-06 NOTE — Telephone Encounter (Signed)
 Pharmacy Patient Advocate Encounter   Received notification from Fax that prior authorization for mexiletine is required/requested.   Insurance verification completed.   The patient is insured through [employer] .   Per test claim: PA required; PA submitted to above mentioned insurance via CoverMyMeds Key/confirmation #/EOC [facility] Status is pending

## 2023-10-10 ENCOUNTER — Ambulatory Visit (HOSPITAL_COMMUNITY): Payer: Medicare HMO

## 2023-10-10 ENCOUNTER — Other Ambulatory Visit (HOSPITAL_COMMUNITY): Payer: Medicare HMO

## 2023-10-16 ENCOUNTER — Ambulatory Visit (INDEPENDENT_AMBULATORY_CARE_PROVIDER_SITE_OTHER): Payer: Medicare Other

## 2023-10-16 DIAGNOSIS — I428 Other cardiomyopathies: Secondary | ICD-10-CM | POA: Diagnosis not present

## 2023-10-19 LAB — CUP PACEART REMOTE DEVICE CHECK
Battery Remaining Longevity: 71 mo
Battery Remaining Percentage: 81 %
Battery Voltage: 2.99 V
Brady Statistic AP VP Percent: 24 %
Brady Statistic AP VS Percent: 1 %
Brady Statistic AS VP Percent: 73 %
Brady Statistic AS VS Percent: 1.1 %
Brady Statistic RA Percent Paced: 24 %
Date Time Interrogation Session: 20250306020018
HighPow Impedance: 79 Ohm
HighPow Impedance: 79 Ohm
Lead Channel Impedance Value: 410 Ohm
Lead Channel Impedance Value: 440 Ohm
Lead Channel Impedance Value: 750 Ohm
Lead Channel Pacing Threshold Amplitude: 0.5 V
Lead Channel Pacing Threshold Amplitude: 0.625 V
Lead Channel Pacing Threshold Amplitude: 0.75 V
Lead Channel Pacing Threshold Pulse Width: 0.5 ms
Lead Channel Pacing Threshold Pulse Width: 0.5 ms
Lead Channel Pacing Threshold Pulse Width: 1 ms
Lead Channel Sensing Intrinsic Amplitude: 11.9 mV
Lead Channel Sensing Intrinsic Amplitude: 5 mV
Lead Channel Setting Pacing Amplitude: 1.5 V
Lead Channel Setting Pacing Amplitude: 2 V
Lead Channel Setting Pacing Amplitude: 2 V
Lead Channel Setting Pacing Pulse Width: 0.5 ms
Lead Channel Setting Pacing Pulse Width: 1 ms
Lead Channel Setting Sensing Sensitivity: 0.5 mV
Pulse Gen Serial Number: 5556126
Zone Setting Status: 755011

## 2023-10-21 ENCOUNTER — Encounter: Payer: Self-pay | Admitting: Internal Medicine

## 2023-10-23 NOTE — Progress Notes (Signed)
 Referred to ICM clinic by Dr Graciela Husbands and Raj Janus device clinic RN.   Unable to enroll at this time due to Cor[employer]ue thoracic impedance is currently not recording.

## 2023-10-24 ENCOUNTER — Telehealth (HOSPITAL_COMMUNITY): Payer: Self-pay

## 2023-10-24 NOTE — Telephone Encounter (Signed)
  ADVANCED HEART FAILURE CLINIC   Pre-operative Risk Assessment   HEARTCARE STAFF-IMPORTANT INSTRUCTIONS 1 Red and Blue Text will auto delete once note is signed or closed. 2 Press F2 to navigate through template.   3 On drop down lists, L click to select >> R click to activate next field 4 Reason for Visit format is IMPORTANT!!  See Directions on No. 2 below. 5 Please review chart to determine if there is already a clearance note open for this procedure!!  DO NOT duplicate if a note already exists!!    :1}     Request for Surgical Clearance    Procedure:  Dental Extraction - Amount of Teeth to be Pulled:  9  Date of Surgery:  Clearance TBD                                 Surgeon:  Juanetta Beets Surgeon's Group or Practice Name:  [facility]  Phone number:  [phone] Fax number:  [phone]   Type of Clearance Requested:   - Medical    Type of Anesthesia:  Not Indicated   Additional requests/questions:    Jule Economy   10/24/2023, 12:13 PM   [facility] Prince Rome, FNP [facility] [address] [facility] [place] [address] [phone] (office) [phone] (fax)

## 2023-10-24 NOTE — Telephone Encounter (Signed)
Clearance form faxed via Epic

## 2023-10-27 ENCOUNTER — Encounter (HOSPITAL_COMMUNITY): Payer: Medicare HMO

## 2023-10-27 ENCOUNTER — Encounter (HOSPITAL_COMMUNITY): Payer: Self-pay

## 2023-10-27 ENCOUNTER — Ambulatory Visit (HOSPITAL_COMMUNITY)
Admission: RE | Admit: 2023-10-27 | Discharge: 2023-10-27 | Disposition: A | Discharge status: Home / Self Care | Source: Ambulatory Visit | Attending: Family Medicine

## 2023-10-27 VITALS — BP 112/76 | HR 68 | Wt 204.0 lb

## 2023-10-27 DIAGNOSIS — I4729 Other ventricular tachycardia: Secondary | ICD-10-CM | POA: Diagnosis not present

## 2023-10-27 DIAGNOSIS — Z7984 Long term (current) use of oral hypoglycemic drugs: Secondary | ICD-10-CM | POA: Insufficient documentation

## 2023-10-27 DIAGNOSIS — F1910 Other psychoactive substance abuse, uncomplicated: Secondary | ICD-10-CM

## 2023-10-27 DIAGNOSIS — Z87891 Personal history of nicotine dependence: Secondary | ICD-10-CM | POA: Diagnosis not present

## 2023-10-27 DIAGNOSIS — I428 Other cardiomyopathies: Secondary | ICD-10-CM | POA: Diagnosis not present

## 2023-10-27 DIAGNOSIS — E1122 Type 2 diabetes mellitus with diabetic chronic kidney disease: Secondary | ICD-10-CM | POA: Insufficient documentation

## 2023-10-27 DIAGNOSIS — Z79899 Other long term (current) drug therapy: Secondary | ICD-10-CM | POA: Diagnosis not present

## 2023-10-27 DIAGNOSIS — N183 Chronic kidney disease, stage 3 unspecified: Secondary | ICD-10-CM | POA: Insufficient documentation

## 2023-10-27 DIAGNOSIS — N401 Enlarged prostate with lower urinary tract symptoms: Secondary | ICD-10-CM | POA: Insufficient documentation

## 2023-10-27 DIAGNOSIS — J439 Emphysema, unspecified: Secondary | ICD-10-CM | POA: Diagnosis not present

## 2023-10-27 DIAGNOSIS — I2510 Atherosclerotic heart disease of native coronary artery without angina pectoris: Secondary | ICD-10-CM | POA: Insufficient documentation

## 2023-10-27 DIAGNOSIS — I130 Hypertensive heart and chronic kidney disease with heart failure and stage 1 through stage 4 chronic kidney disease, or unspecified chronic kidney disease: Secondary | ICD-10-CM | POA: Insufficient documentation

## 2023-10-27 DIAGNOSIS — M25551 Pain in right hip: Secondary | ICD-10-CM | POA: Insufficient documentation

## 2023-10-27 DIAGNOSIS — F1290 Cannabis use, unspecified, uncomplicated: Secondary | ICD-10-CM | POA: Diagnosis not present

## 2023-10-27 DIAGNOSIS — I472 Ventricular tachycardia, unspecified: Secondary | ICD-10-CM | POA: Diagnosis not present

## 2023-10-27 DIAGNOSIS — I5022 Chronic systolic (congestive) heart failure: Secondary | ICD-10-CM | POA: Diagnosis present

## 2023-10-27 DIAGNOSIS — F1410 Cocaine abuse, uncomplicated: Secondary | ICD-10-CM | POA: Insufficient documentation

## 2023-10-27 DIAGNOSIS — M169 Osteoarthritis of hip, unspecified: Secondary | ICD-10-CM | POA: Insufficient documentation

## 2023-10-27 DIAGNOSIS — R3914 Feeling of incomplete bladder emptying: Secondary | ICD-10-CM

## 2023-10-27 LAB — BASIC METABOLIC PANEL
Anion gap: 10 (ref 5–15)
BUN: 21 mg/dL (ref 8–23)
CO2: 25 mmol/L (ref 22–32)
Calcium: 9.4 mg/dL (ref 8.9–10.3)
Chloride: 102 mmol/L (ref 98–111)
Creatinine, Ser: 2.11 mg/dL — ABNORMAL HIGH (ref 0.61–1.24)
GFR, Estimated: 33 mL/min — ABNORMAL LOW (ref >60)
Glucose, Bld: 89 mg/dL (ref 70–99)
Potassium: 4.1 mmol/L (ref 3.5–5.1)
Sodium: 137 mmol/L (ref 135–145)

## 2023-10-27 LAB — BRAIN NATRIURETIC PEPTIDE: B Natriuretic Peptide: 375.3 pg/mL — ABNORMAL HIGH (ref 0.0–100.0)

## 2023-10-27 LAB — MAGNESIUM: Magnesium: 2.5 mg/dL — ABNORMAL HIGH (ref 1.7–2.4)

## 2023-10-27 NOTE — Patient Instructions (Addendum)
 Thank you for coming in today  If you had labs drawn today, any labs that are abnormal the clinic will call you No news is good news  Medications: Please follow up with eye doctor about Amiodarone  Follow up appointments:  Your physician recommends that you schedule a follow-up appointment in:  3 months With Dr. Shirlee Latch    Do the following things EVERYDAY: Weigh yourself in the morning before breakfast. Write it down and keep it in a log. Take your medicines as prescribed Eat low salt foods--Limit salt (sodium) to 2000 mg per day.  Stay as active as you can everyday Limit all fluids for the day to less than 2 liters   At the [facility], you and your health needs are our priority. As part of our continuing mission to provide you with exceptional heart care, we have created designated Provider Care Teams. These Care Teams include your primary Cardiologist (physician) and Advanced Practice Providers (APPs- Physician Assistants and Nurse Practitioners) who all work together to provide you with the care you need, when you need it.   You may see any of the following providers on your designated Care Team at your next follow up: Dr Arvilla Meres Dr Marca Ancona Dr. Marcos Eke, NP Robbie Lis, [place] [facility] [place], [place] Brynda Peon, NP Karle Plumber, PharmD   Please be sure to bring in all your medications bottles to every appointment.    Thank you for choosing [facility]  If you have any questions or concerns before your next appointment please send [place] a message through [place] or call our office at [phone].    TO LEAVE A MESSAGE FOR THE NURSE SELECT OPTION 2, PLEASE LEAVE A MESSAGE INCLUDING: YOUR NAME DATE OF BIRTH CALL BACK NUMBER REASON FOR CALL**this is important as we prioritize the call backs  YOU WILL RECEIVE A CALL BACK THE SAME DAY AS LONG AS YOU CALL BEFORE 4:00  PM

## 2023-10-27 NOTE — Progress Notes (Signed)
 Advanced Heart Failure Clinic Note   Primary Care: Loura Back, NP HF Cardiologist: Dr. Shirlee Latch   HPI: Edward Mullen is a 68 y.o. male with history of HTN, DM, chronic systolic CHF, and polysubstance abuse. Former heavy smoker. Quit 2024.   Admitted 08/23/16 with acute systolic CHF in setting of substance abuse. Echo was done, showing EF 15-20%.  Diuresed with IV lasix and meds adjusted as tolerated.  UDS + for cocaine on admission. Cardiac cath showed coronary disease but not significant enough to explain cardiomyopathy.   He was admitted with syncope in 5/18 after using cocaine.  He was noted to have NSVT on telemetry.  Syncope suspected due to VT, no ICD given active substance abuse but had Lifevest placed. EF 50% on 8/18 echo,, so Lifevest subsequently removed.  He started using cocaine again and stopped his cardiac medications. In 4/22, he was admitted with acute on chronic systolic CHF.  Frequent NSVT was noted and amiodarone was started.  He had echo showing EF 20-25% with normal RV.  LHC showed 80% PLV stenosis, managed medically.   Admitted 3/23 with a/c CHF and PNA. Echo with EF 20-25%, RV mildly reduced. Started on milrinone and IV lasix. RHC showed mildly elevated PCWP and LVEDP, CI 1.9. Milrinone weaned off and GDMT titrated. Had NSVT/VT, discharged home with LifeVest, weight 202 lbs.  Echo 8/23 showed EF 25-30%, global hypokinesis, moderate LV dilation, mild LVH, mildly decreased RV systolic function. Referred to EP for CRT-D consideration.  S/P St Jude CRT-D 12/23. .  Admitted 4/24 with OOH arrest, found unresponsive in his car. Required 8 mins CPR. Suspected cocaine OD.  UDS + for benzos, THC, and cocaine. Developed cardiogenic shock postcardiac arrest. Required intubation and pressors in CCU. + for rhinovirus during admission. Pressors weaned off. Discharged home on coreg, mexilitine, amiodarone, and torsemide. Losartan and spironolactone held with hyperkalemia.   Admitted  7/24 with lightheadedness, hypotension and syncope. GDMT initially held but was all restarted at discharge. Suspected dehydration/hypovolemia. Had acute urinary retention and right-sided epididymitis, discharged with foley. Torsemide was decreased from 20 mg daily to 10 mg 3x/week. Discharge weight 181 lbs.   Admitted 2/25 for VT successfully treated with ATP. UDS + cocaine and THC. EP consulted, and amiodarone increased to 200 bid x 10 days then back to 200 mg daily.   Today he returns for post hospital HF follow up. Overall feeling fine. He is not SOB walking short distances on flat ground, has right hip pain and walking with a cane. Denies  palpitations, abnormal bleeding, CP, dizziness, edema, or PND/Orthopnea. Appetite ok. No fever or chills. Weight at home 195 pounds. Taking all medications. Smokes THC, last used cocaine a couple weeks ago. Planning prostate artery embolization on Friday.  ECG (personally reviewed):   St Jude device interrogation (personally reviewed): CorVue stable, 97% BiV, 6 seconds of NSVT on 10/19/13  Social History: Lives in [place] with daughter. Prior heavy ETOH, now quit.  Quit smoking 10/20. Has quit using cocaine per his report since 4/24.  Active marijuana.   Family History  Problem Relation Age of Onset   Stroke Mother 20   Heart disease Father 34   Colon cancer Neg Hx    Colon polyps Neg Hx    Esophageal cancer Neg Hx    Stomach cancer Neg Hx    Rectal cancer Neg Hx    Past Medical History: 1. HTN  2. Type II diabetes 3. Cocaine abuse 4. COPD: Prior smoker.  5.  Cardiomyopathy: Nonischemic. St Jude CRT-D device.  - Echo (1/18) with EF 15-20%, moderate LVH, moderate AI, moderate to severe MR, normal RV size with mildly decreased systolic function, PASP 44 mmHg.  - LHC/RHC (1/18): 80% stenosis in PLV branch.  Mean RA 5, PA 47/20 mean 32, mean PCWP 22, CI 2.01.  - Echo (8/18): EF 50% with moderate LVH, diffuse hypokinesis, normal RV size and systolic  function, mild AI, trivial MR.  - Echo (4/22): EF 20-25%, severe LV dilation, normal RV. - Echo (2/23):  EF 20-25%, RV mildly reduced.  - Echo (8/23): EF 25-30%, global hypokinesis, moderate LV dilation, mild LVH, mildly decreased RV systolic function.  6. CAD: LHC (1/18) with 80% stenosis in a branch of the PLV (not enough CAD to explain cardiomyopathy).  - LHC (4/22): PLV 80%, 25% mLAD.  - R/LHC (2/23): 1st RPL 80%, 30% mLAD; mildly elevated PCWP and LVEDP, normal RA pressures, CI 1.9 - Echo 10/23 EF 20-25% RV moderately reduced.  - Echo 4/24 EF25-30%, normal RV.  - Echo 7/24 EF <20%, GIDD, RV mildly reduced systolic function, mild MR.  7. Mitral regurgitation: Moderate to severe on 1/18 echo, probably functional.  Trivial MR on 8/18 echo.  8. CKD: Stage 3.  9. Syncope: 5/18 in setting of cocaine use, concern for VT.  10. Lower extremity arterial dopplers (7/18): No significant PAD.  11. Sciatica 12. NSVT 13. CKD stage 3  Current Outpatient Medications  Medication Sig Dispense Refill   albuterol (VENTOLIN HFA) 108 (90 Base) MCG/ACT inhaler INHALE 1 TO 2 PUFFS BY MOUTH EVERY 6 HOURS AS NEEDED FOR WHEEZING OR SHORTNESS OF BREATH 18 g 2   amiodarone (PACERONE) 200 MG tablet Take 1 tablet (200 mg total) by mouth 2 (two) times daily for 10 days. Then resume 200mg  once daily. 20 tablet 0   ASPIRIN LOW DOSE 81 MG tablet TAKE 1 TABLET (81 MG TOTAL) BY MOUTH DAILY. SWALLOW WHOLE(AM) 30 tablet 11   Blood Glucose Monitoring Suppl DEVI 1 each by Does not apply route in the morning, at noon, and at bedtime. May substitute to any manufacturer covered by patient's insurance. 1 each 0   carvedilol (COREG) 3.125 MG tablet Take 1 tablet (3.125 mg total) by mouth 2 (two) times daily. 60 tablet 11   Cholecalciferol (VITAMIN D3) 50 MCG (2000 UT) TABS Take 2,000 Units by mouth daily.     diphenhydramine-acetaminophen (TYLENOL PM) 25-500 MG TABS tablet Take 2 tablets by mouth at bedtime as needed (for sleep).      gabapentin (NEURONTIN) 100 MG capsule Take 2 capsules (200 mg total) by mouth 3 (three) times daily. 90 capsule 0   gabapentin (NEURONTIN) 600 MG tablet Take 600 mg by mouth 2 (two) times daily.     Glucose Blood (BLOOD GLUCOSE TEST STRIPS) STRP 1 each by In Vitro route in the morning, at noon, and at bedtime. May substitute to any manufacturer covered by patient's insurance. 100 strip 0   JARDIANCE 10 MG TABS tablet Take 10 mg by mouth daily.     Lancet Device MISC 1 each by Does not apply route in the morning, at noon, and at bedtime. May substitute to any manufacturer covered by patient's insurance. 1 each 0   Lancets Misc. MISC 1 each by Does not apply route in the morning, at noon, and at bedtime. May substitute to any manufacturer covered by patient's insurance. 100 each 0   losartan (COZAAR) 25 MG tablet Take 25 mg by mouth daily.  mexiletine (MEXITIL) 150 MG capsule TAKE 1 CAPSULE BY MOUTH 2 (TWO) TIMES DAILY (AM+BEDTIME) 30 capsule 0   nortriptyline (PAMELOR) 10 MG capsule Take 10 mg by mouth at bedtime.     rosuvastatin (CRESTOR) 20 MG tablet Take 1 tablet (20 mg total) by mouth daily. 90 tablet 3   spironolactone (ALDACTONE) 25 MG tablet Take 0.5 tablets (12.5 mg total) by mouth daily. 15 tablet 3   STIOLTO RESPIMAT 2.5-2.5 MCG/ACT AERS Inhale 2 puffs into the lungs daily.     tamsulosin (FLOMAX) 0.4 MG CAPS capsule Take 0.4 mg by mouth at bedtime.     torsemide (DEMADEX) 20 MG tablet Take 1 tablet (20 mg total) by mouth daily. 30 tablet 3   No current facility-administered medications for this encounter.   No Known Allergies  BP 112/76   Pulse 68   Wt 92.5 kg (204 lb)   SpO2 99%   BMI 27.67 kg/m   Wt Readings from Last 3 Encounters:  10/27/23 92.5 kg (204 lb)  10/01/23 87.8 kg (193 lb 9 oz)  09/22/23 88.2 kg (194 lb 6.4 oz)    PHYSICAL EXAM: General:  NAD. No resp difficulty, walked into the clinic with a cane HEENT: Normal Neck: Supple. No JVD. Cor: Regular rate  & rhythm. No rubs, gallops or murmurs. Lungs: Clear Abdomen: Soft, nontender, nondistended.  Extremities: No cyanosis, clubbing, rash, edema Neuro: Alert & oriented x 3, moves all 4 extremities w/o difficulty. Affect pleasant.  ASSESSMENT & PLAN: 1. Chronic systolic CHF:  EF 15-20% with moderate to severe MR on echo 1/18.  Nonischemic cardiomyopathy, has St Jude CRT-D device. HTN vs cocaine use vs viral vs ETOH. HIV negative, SPEP with no M-spike.  Patient has been noted to have CAD but not enough to explain cardiomyopathy (Cath 2/23 admission with stable 80% PLV stenosis).  Echo in 8/18 showed EF up to 50%. However, he stopped his meds and started cocaine again, echo in 4/22 with EF back down to 20-25% => suspect cocaine abuse may be the primary etiology of cardiomyopathy. Echo in 2/23 with EF 20-25%, RV mildly reduced. RHC (3/23) with mildly elevated PCWP and LVEDP, CI 1.9. Echo (8/23) showed EF 25-30%, global hypokinesis, moderate LV dilation, mild LVH, mildly decreased RV systolic function. Echo 10/23 EF 20-25% RV moderately reduced.  Echo 7/24 EF < 20%, GIDD, RV mildly reduced.  Improved NYHA II, limited by hip OA. He is not volume overloaded on exam or by CorVue. - Continue torsemide 20 mg daily. BMET and BNP today. - Continue losartan 25 mg daily - Continue carvedilol 3.125 mg bid. - Continue Jardiance 10 mg daily.  - Continue spironolactone 12.5 mg daily.  2.VT/NSVT: Multiple runs during 3/23 admission, longest was 28 beats. Coronary angiography showed no change in coronaries. Has St Jude CRT-D. ATP on 09/30/23 - Continue amiodarone 200 mg daily. Amio lab UTD, had PFTs 2025. He should get regular eye exam, we discussed this today.  - Continue mexiletine 150 mg bid 3. CKD III: Baseline SCr 1.6 - Continue SGLT2i. BMET today. 4. CAD: 80% stenosis PLV on cath in 2/23.  No chest pain.  - Continue ASA + statin 5. Polysubstance abuse abuse: Last used cocaine 2 weeks ago, still smoking marijuana.   - Discussed cessation. 6. COPD: PFTs 08/2023 reviewed. Moderated airway obstruction, emphysema - Continue inhalers. 7. BPH: Planning prostate artery embolization this week with Urology. He is at least moderate CV risk for procedure. RCRI score = 3 pts (15% risk  of major cardiac event). He is optimized from HF standpoint.  Follow up in 3 months with Dr Shirlee Latch.   Anderson Malta Jantz Main NP-C  10/27/2023

## 2023-10-30 ENCOUNTER — Other Ambulatory Visit: Payer: Self-pay | Admitting: Radiology

## 2023-10-30 DIAGNOSIS — N401 Enlarged prostate with lower urinary tract symptoms: Secondary | ICD-10-CM

## 2023-10-30 NOTE — H&P (Signed)
 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 male with PMH sig for arthritis, CAD,CKD, CHF, polysubstance abuse, HTN, NICM, NSVT, COPD, DM , St Jude CRT-D placement and BPH with LUTS. He was seen in consultation by Dr. Loreta Ave on 08/27/23 to discuss treatment options for BPH with LUTS and was deemed an appropriate candidate for bilateral prostate artery embolization. He is scheduled today for the procedure.   *** Patient is Full Code  Past Medical History:  Diagnosis Date   Arthritis    "feels like it in my legs" (09/20/2014)   CAD (coronary artery disease)    Chronic kidney disease, stage 3a (HCC)    Chronic systolic CHF (congestive heart failure) (HCC)    Cocaine abuse (HCC)    Hypertension    NICM (nonischemic cardiomyopathy) (HCC)    NSVT (nonsustained ventricular tachycardia) (HCC) 09/06/2016   pre diabetes    patient denies    Past Surgical History:  Procedure Laterality Date   BIV ICD INSERTION CRT-D N/A 07/17/2022   Procedure: BIV ICD INSERTION CRT-D;  Surgeon: Duke Salvia, MD;  Location: [facility] INVASIVE CV LAB;  Service: Cardiovascular;  Laterality: N/A;   CARDIAC CATHETERIZATION N/A 08/27/2016   Procedure: Right/Left Heart Cath and Coronary Angiography;  Surgeon: Laurey Morale, MD;  Location: [facility] INVASIVE CV LAB;  Service: Cardiovascular;  Laterality: N/A;   COLONOSCOPY     CYSTECTOMY Right    "back of my shoulder"   IR RADIOLOGIST EVAL & MGMT  08/27/2023   RIGHT/LEFT HEART CATH AND CORONARY ANGIOGRAPHY N/A 12/04/2020   Procedure: RIGHT/LEFT HEART CATH AND CORONARY ANGIOGRAPHY;  Surgeon: Laurey Morale, MD;  Location: [facility] INVASIVE CV LAB;  Service: Cardiovascular;  Laterality: N/A;   RIGHT/LEFT HEART CATH AND CORONARY ANGIOGRAPHY N/A 10/10/2021   Procedure: RIGHT/LEFT HEART  CATH AND CORONARY ANGIOGRAPHY;  Surgeon: Laurey Morale, MD;  Location: [facility] INVASIVE CV LAB;  Service: Cardiovascular;  Laterality: N/A;   TONSILLECTOMY      Allergies: Patient has no known allergies.  Medications: Prior to Admission medications   Medication Sig Start Date End Date Taking? Authorizing Provider  albuterol (VENTOLIN HFA) 108 (90 Base) MCG/ACT inhaler INHALE 1 TO 2 PUFFS BY MOUTH EVERY 6 HOURS AS NEEDED FOR WHEEZING OR SHORTNESS OF BREATH 10/29/22   Morene Crocker, MD  amiodarone (PACERONE) 200 MG tablet Take 1 tablet (200 mg total) by mouth 2 (two) times daily for 10 days. Then resume 200mg  once daily. 10/02/23 10/26/24  Noralee Stain, DO  ASPIRIN LOW DOSE 81 MG tablet TAKE 1 TABLET (81 MG TOTAL) BY MOUTH DAILY. SWALLOW WHOLE(AM) 01/27/23   [place]ford, Anderson Malta, FNP  Blood Glucose Monitoring Suppl DEVI 1 each by Does not apply route in the morning, at noon, and at bedtime. May substitute to any manufacturer covered by patient's insurance. 10/02/23   Noralee Stain, DO  carvedilol (COREG) 3.125 MG tablet Take 1 tablet (3.125 mg total) by mouth 2 (two) times daily. 01/29/22 03/25/24  Jacklynn Ganong, FNP  Cholecalciferol (VITAMIN D3) 50 MCG (2000 UT) TABS Take 2,000 Units by mouth daily.    [provider]  diphenhydramine-acetaminophen (TYLENOL PM) 25-500 MG TABS tablet Take 2 tablets by mouth at bedtime as needed (for sleep).    [provider]  gabapentin (NEURONTIN) 100 MG capsule Take 2 capsules (200 mg  total) by mouth 3 (three) times daily. 12/16/22 03/25/24  Rocky Morel, DO  gabapentin (NEURONTIN) 600 MG tablet Take 600 mg by mouth 2 (two) times daily.    [provider]  Glucose Blood (BLOOD GLUCOSE TEST STRIPS) STRP 1 each by In Vitro route in the morning, at noon, and at bedtime. May substitute to any manufacturer covered by patient's insurance. 10/02/23 11/01/23  Noralee Stain, DO  JARDIANCE 10 MG TABS tablet Take 10 mg by mouth daily.  04/03/23   [provider]  Lancet Device MISC 1 each by Does not apply route in the morning, at noon, and at bedtime. May substitute to any manufacturer covered by patient's insurance. 10/02/23 11/01/23  Noralee Stain, DO  Lancets Misc. MISC 1 each by Does not apply route in the morning, at noon, and at bedtime. May substitute to any manufacturer covered by patient's insurance. 10/02/23 11/01/23  Noralee Stain, DO  losartan (COZAAR) 25 MG tablet Take 25 mg by mouth daily.    [provider]  mexiletine (MEXITIL) 150 MG capsule TAKE 1 CAPSULE BY MOUTH 2 (TWO) TIMES DAILY (AM+BEDTIME) 10/03/23   Duke Salvia, MD  nortriptyline (PAMELOR) 10 MG capsule Take 10 mg by mouth at bedtime.    [provider]  rosuvastatin (CRESTOR) 20 MG tablet Take 1 tablet (20 mg total) by mouth daily. 07/25/23   Laurey Morale, MD  spironolactone (ALDACTONE) 25 MG tablet Take 0.5 tablets (12.5 mg total) by mouth daily. 07/25/23   Laurey Morale, MD  STIOLTO RESPIMAT 2.5-2.5 MCG/ACT AERS Inhale 2 puffs into the lungs daily. 06/11/23   [provider]  tamsulosin (FLOMAX) 0.4 MG CAPS capsule Take 0.4 mg by mouth at bedtime.    [provider]  torsemide (DEMADEX) 20 MG tablet Take 1 tablet (20 mg total) by mouth daily. 08/08/23   Laurey Morale, MD  zolpidem (AMBIEN) 10 MG tablet Take 1 tablet (10 mg total) by mouth at bedtime as needed for sleep. 09/16/22 01/03/23       Family History  Problem Relation Age of Onset   Stroke Mother 81   Heart disease Father 71   Colon cancer Neg Hx    Colon polyps Neg Hx    Esophageal cancer Neg Hx    Stomach cancer Neg Hx    Rectal cancer Neg Hx     Social History   Socioeconomic History   Marital status: Married    Spouse name: Not on file   Number of children: Not on file   Years of education: Not on file   Highest education level: Not on file  Occupational History   Occupation: Special educational needs teacher    Comment: has not been able  to work steadily for the last year or two  Tobacco Use   Smoking status: Some Days    Current packs/day: 0.10    Average packs/day: 0.1 packs/day for 48.0 years (4.8 ttl pk-yrs)    Types: Cigarettes   Smokeless tobacco: Never   Tobacco comments:    cutting back 2  per day.  No cigs  Vaping Use   Vaping status: Never Used  Substance and Sexual Activity   Alcohol use: Yes    Alcohol/week: 1.0 standard drink of alcohol    Types: 1 Cans of beer per week    Comment: occasionally   Drug use: Yes    Types: Marijuana    Comment: daily 4 times last 2 weeks ago as of 05/31/2019  Sexual activity: Yes    Birth control/protection: None  Other Topics Concern   Not on file  Social History Narrative   Not on file   Social Drivers of Health   Financial Resource Strain: Low Risk  (06/17/2022)   Overall Financial Resource Strain (CARDIA)    Difficulty of Paying Living Expenses: Not hard at all  Food Insecurity: No Food Insecurity (10/01/2023)   Hunger Vital Sign    Worried About Running Out of Food in the Last Year: Never true    Ran Out of Food in the Last Year: Never true  Transportation Needs: Unmet Transportation Needs (10/01/2023)   PRAPARE - Transportation    Lack of Transportation (Medical): Yes    Lack of Transportation (Non-Medical): Yes  Physical Activity: Inactive (06/17/2022)   Exercise Vital Sign    Days of Exercise per Week: 0 days    Minutes of Exercise per Session: 0 min  Stress: No Stress Concern Present (06/17/2022)   [employer] of Occupational Health - Occupational Stress Questionnaire    Feeling of Stress : Not at all  Social Connections: Unknown (10/02/2023)   Social Connection and Isolation Panel [NHANES]    Frequency of Communication with Friends and Family: More than three times a week    Frequency of Social Gatherings with Friends and Family: More than three times a week    Attends Religious Services: Never    Database administrator or Organizations: No     Attends Banker Meetings: Never    Marital Status: Patient declined       Review of Systems  Vital Signs:   Advance Care Plan:   Physical Exam  Imaging: CUP PACEART REMOTE DEVICE CHECK Result Date: 10/19/2023 Scheduled remote reviewed. Normal device function.  Next remote 91 days. ML, CVRS  VAS [place] LOWER EXTREMITY VENOUS (DVT) (7a-7p) Result Date: 09/30/2023  Lower Venous DVT Study Patient Name:  Edward Mullen  Date of Exam:   09/30/2023 Medical Rec #: [id number]          Accession #:    [id number] Date of Birth: 06-Aug-1956         Patient Gender: M Patient Age:   2 years Exam Location:   [facility] Procedure:      VAS [place] LOWER EXTREMITY VENOUS (DVT) Referring Phys: Riki Sheer --------------------------------------------------------------------------------  Indications: Pain.  Risk Factors: Immobility. Comparison Study: None. Performing Technologist: Shona Simpson  Examination Guidelines: A complete evaluation includes B-mode imaging, spectral Doppler, color Doppler, and power Doppler as needed of all accessible portions of each vessel. Bilateral testing is considered an integral part of a complete examination. Limited examinations for reoccurring indications may be performed as noted. The reflux portion of the exam is performed with the patient in reverse Trendelenburg.  +---------+---------------+---------+-----------+----------+--------------+ RIGHT    CompressibilityPhasicitySpontaneityPropertiesThrombus Aging +---------+---------------+---------+-----------+----------+--------------+ CFV      Full           Yes      Yes                                 +---------+---------------+---------+-----------+----------+--------------+ SFJ      Full                                                        +---------+---------------+---------+-----------+----------+--------------+  FV Prox  Full                                                         +---------+---------------+---------+-----------+----------+--------------+ FV Mid   Full                                                        +---------+---------------+---------+-----------+----------+--------------+ FV DistalFull                                                        +---------+---------------+---------+-----------+----------+--------------+ PFV      Full                                                        +---------+---------------+---------+-----------+----------+--------------+ POP      Full           Yes      Yes                                 +---------+---------------+---------+-----------+----------+--------------+ PTV      Full                                                        +---------+---------------+---------+-----------+----------+--------------+ PERO     Full                                                        +---------+---------------+---------+-----------+----------+--------------+   +----+---------------+---------+-----------+----------+--------------+ LEFTCompressibilityPhasicitySpontaneityPropertiesThrombus Aging +----+---------------+---------+-----------+----------+--------------+ CFV Full           Yes      Yes                                 +----+---------------+---------+-----------+----------+--------------+     Summary: RIGHT: - There is no evidence of deep vein thrombosis in the lower extremity.  - No cystic structure found in the popliteal fossa.  LEFT: - No evidence of common femoral vein obstruction.   *See table(s) above for measurements and observations. Electronically signed by Lemar Livings MD on 09/30/2023 at 4:34:13 PM.    Final     Labs:  CBC: Recent Labs    04/17/23 0256 07/07/23 1508 09/30/23 1432 10/01/23 0440  WBC 6.5 3.6* 5.8 5.1  HGB 13.3 13.3 14.4 13.5  HCT 41.4 41.6 44.9 42.4  PLT 134* 109* 114* 93*    COAGS: Recent Labs    03/10/23  1129  INR 1.1  APTT 30     BMP: Recent Labs    09/30/23 1432 10/01/23 0440 10/02/23 0135 10/27/23 1613  NA 138 136 138 137  K 4.2 4.2 4.6 4.1  CL 103 105 106 102  CO2 22 21* 23 25  GLUCOSE 108* 77 88 89  BUN 42* 40* 26* 21  CALCIUM 9.8 8.9 9.5 9.4  CREATININE 2.27* 1.79*  1.57* 1.42* 2.11*  GFRNONAA 30* 41*  47* 53* 33*    LIVER FUNCTION TESTS: Recent Labs    03/06/23 1633 03/10/23 1129 04/15/23 1437 09/25/23 0956  BILITOT 1.5* 0.6 1.0 0.8  AST 23 20 38 35  ALT 15 14 25 29   ALKPHOS 92 121 160* 123  PROT 7.7 7.9 8.4* 8.5*  ALBUMIN 3.0* 3.0* 3.4* 3.7    TUMOR MARKERS: No results for input(s): "AFPTM", "CEA", "CA199", "CHROMGRNA" in the last 8760 hours.  Assessment and Plan: 68 y.o. male with PMH sig for arthritis, CAD,CKD, CHF, polysubstance abuse, HTN, NICM, NSVT, COPD, DM , St Jude CRT-D placement and BPH with LUTS. He was seen in consultation by Dr. Loreta Ave on 08/27/23 to discuss treatment options for BPH with LUTS and was deemed an appropriate candidate for bilateral prostate artery embolization. He is scheduled today for the procedure.Risks and benefits of procedure were discussed with the patient including, but not limited to bleeding, infection, vascular injury or contrast induced renal failure.  This interventional procedure involves the use of X-rays and because of the nature of the planned procedure, it is possible that we will have prolonged use of X-ray fluoroscopy.  Potential radiation risks to you include (but are not limited to) the following: - A slightly elevated risk for cancer  several years later in life. This risk is typically less than 0.5% percent. This risk is low in comparison to the normal incidence of human cancer, which is 33% for women and 50% for men according to the American Cancer Society. - Radiation induced injury can include skin redness, resembling a rash, tissue breakdown / ulcers and hair loss (which can be temporary or permanent).   The likelihood of  either of these occurring depends on the difficulty of the procedure and whether you are sensitive to radiation due to previous procedures, disease, or genetic conditions.   IF your procedure requires a prolonged use of radiation, you will be notified and given written instructions for further action.  It is your responsibility to monitor the irradiated area for the 2 weeks following the procedure and to notify your physician if you are concerned that you have suffered a radiation induced injury.    All of the patient's questions were answered, patient is agreeable to proceed.  Consent signed and in chart.      Thank you for allowing our service to participate in Edward Mullen 's care.  Electronically Signed: D. Jeananne Rama, PA-C   10/30/2023, 3:21 PM      I spent a total of  30 Minutes   in face to face in clinical consultation, greater than 50% of which was counseling/coordinating care for bilateral prostate artery embolization

## 2023-10-31 ENCOUNTER — Encounter (HOSPITAL_COMMUNITY): Payer: Self-pay

## 2023-10-31 ENCOUNTER — Ambulatory Visit (HOSPITAL_COMMUNITY)
Admission: RE | Admit: 2023-10-31 | Discharge: 2023-10-31 | Disposition: A | Discharge status: Home / Self Care | Payer: Medicare HMO | Source: Ambulatory Visit | Attending: Interventional Radiology | Admitting: Interventional Radiology

## 2023-10-31 DIAGNOSIS — J449 Chronic obstructive pulmonary disease, unspecified: Secondary | ICD-10-CM | POA: Insufficient documentation

## 2023-10-31 DIAGNOSIS — N138 Other obstructive and reflux uropathy: Secondary | ICD-10-CM

## 2023-10-31 DIAGNOSIS — Z7984 Long term (current) use of oral hypoglycemic drugs: Secondary | ICD-10-CM | POA: Insufficient documentation

## 2023-10-31 DIAGNOSIS — Z7982 Long term (current) use of aspirin: Secondary | ICD-10-CM | POA: Diagnosis not present

## 2023-10-31 DIAGNOSIS — N401 Enlarged prostate with lower urinary tract symptoms: Secondary | ICD-10-CM | POA: Insufficient documentation

## 2023-10-31 DIAGNOSIS — N1831 Chronic kidney disease, stage 3a: Secondary | ICD-10-CM | POA: Diagnosis not present

## 2023-10-31 DIAGNOSIS — I130 Hypertensive heart and chronic kidney disease with heart failure and stage 1 through stage 4 chronic kidney disease, or unspecified chronic kidney disease: Secondary | ICD-10-CM | POA: Diagnosis not present

## 2023-10-31 DIAGNOSIS — F17210 Nicotine dependence, cigarettes, uncomplicated: Secondary | ICD-10-CM | POA: Insufficient documentation

## 2023-10-31 DIAGNOSIS — R338 Other retention of urine: Secondary | ICD-10-CM | POA: Diagnosis present

## 2023-10-31 DIAGNOSIS — Z95810 Presence of automatic (implantable) cardiac defibrillator: Secondary | ICD-10-CM | POA: Diagnosis not present

## 2023-10-31 DIAGNOSIS — E1122 Type 2 diabetes mellitus with diabetic chronic kidney disease: Secondary | ICD-10-CM | POA: Diagnosis not present

## 2023-10-31 DIAGNOSIS — Z79899 Other long term (current) drug therapy: Secondary | ICD-10-CM | POA: Diagnosis not present

## 2023-10-31 HISTORY — DX: Cardiac murmur, unspecified: R01.1

## 2023-10-31 LAB — CBC
HCT: 43 % (ref 39.0–52.0)
Hemoglobin: 13.2 g/dL (ref 13.0–17.0)
MCH: 28.1 pg (ref 26.0–34.0)
MCHC: 30.7 g/dL (ref 30.0–36.0)
MCV: 91.7 fL (ref 80.0–100.0)
Platelets: 103 10*3/uL — ABNORMAL LOW (ref 150–400)
RBC: 4.69 MIL/uL (ref 4.22–5.81)
RDW: 15.3 % (ref 11.5–15.5)
WBC: 4.4 10*3/uL (ref 4.0–10.5)
nRBC: 0 % (ref 0.0–0.2)

## 2023-10-31 LAB — BASIC METABOLIC PANEL
Anion gap: 9 (ref 5–15)
BUN: 21 mg/dL (ref 8–23)
CO2: 24 mmol/L (ref 22–32)
Calcium: 9.2 mg/dL (ref 8.9–10.3)
Chloride: 107 mmol/L (ref 98–111)
Creatinine, Ser: 1.76 mg/dL — ABNORMAL HIGH (ref 0.61–1.24)
GFR, Estimated: 41 mL/min — ABNORMAL LOW (ref >60)
Glucose, Bld: 83 mg/dL (ref 70–99)
Potassium: 4.2 mmol/L (ref 3.5–5.1)
Sodium: 140 mmol/L (ref 135–145)

## 2023-10-31 LAB — APTT: aPTT: 32 s (ref 24–36)

## 2023-10-31 LAB — PROTIME-INR
INR: 1.2 (ref 0.8–1.2)
Prothrombin Time: 15.1 s (ref 11.4–15.2)

## 2023-10-31 MED ORDER — CHLORHEXIDINE GLUCONATE CLOTH 2 % EX PADS: 6.0000 | MEDICATED_PAD | Freq: Every day | CUTANEOUS | Status: DC

## 2023-10-31 MED ORDER — LEVOFLOXACIN IN D5W 500 MG/100ML IV SOLN
500.0000 mg | Freq: Once | INTRAVENOUS | Status: DC
  Filled 2023-10-31: qty 100

## 2023-10-31 MED ORDER — LIDOCAINE HCL 1 % IJ SOLN
INTRAMUSCULAR | Status: AC
  Filled 2023-10-31: qty 20

## 2023-10-31 MED ORDER — SODIUM CHLORIDE 0.9 % IV SOLN: INTRAVENOUS | Status: DC

## 2023-11-03 ENCOUNTER — Other Ambulatory Visit: Payer: Self-pay | Admitting: Internal Medicine

## 2023-11-03 ENCOUNTER — Other Ambulatory Visit (HOSPITAL_COMMUNITY): Payer: Self-pay | Admitting: Cardiology

## 2023-11-03 DIAGNOSIS — I5022 Chronic systolic (congestive) heart failure: Secondary | ICD-10-CM

## 2023-11-05 ENCOUNTER — Encounter: Payer: Self-pay | Admitting: Cardiology

## 2023-11-21 NOTE — Addendum Note (Signed)
 Addended by: Elease Etienne A on: 11/21/2023 01:25 PM   Modules accepted: Orders

## 2023-11-21 NOTE — Progress Notes (Signed)
 Remote ICD transmission.

## 2023-12-04 ENCOUNTER — Telehealth: Payer: Self-pay

## 2023-12-04 NOTE — Telephone Encounter (Signed)
 Patient will need isometrics.

## 2023-12-04 NOTE — Telephone Encounter (Signed)
 Attempted to contact patient. No answer, left message to call back.  Attempted both numbers on file with no answer. Unable to leave VM on home phone.

## 2023-12-04 NOTE — Telephone Encounter (Signed)
 Alert received from [employer] for Alert remote transmission:  LeadAssuranceT Alert (V. Noise Reversion). 7 events, 4-8sec in duration, EGM's c/w oversensing ventricular lead noise - route to triage.  Consulted with Geraldean Klein, ST. Jude. Concern for lead fx considering sudden increase in RV impedance which has returned to normal or possible EMI. Can consider programming on [employer].   Attempted to call patient for apt. Pt is overdue for OV. No answer, LMTCB.

## 2023-12-05 ENCOUNTER — Emergency Department (HOSPITAL_COMMUNITY)

## 2023-12-05 ENCOUNTER — Telehealth: Payer: Self-pay | Admitting: Physician Assistant

## 2023-12-05 ENCOUNTER — Other Ambulatory Visit: Payer: Self-pay

## 2023-12-05 ENCOUNTER — Emergency Department (HOSPITAL_COMMUNITY): Admission: EM | Admit: 2023-12-05 | Discharge: 2023-12-05 | Disposition: A | Discharge status: Home / Self Care

## 2023-12-05 ENCOUNTER — Encounter (HOSPITAL_COMMUNITY): Payer: Self-pay | Admitting: *Deleted

## 2023-12-05 DIAGNOSIS — J449 Chronic obstructive pulmonary disease, unspecified: Secondary | ICD-10-CM | POA: Diagnosis not present

## 2023-12-05 DIAGNOSIS — Z79899 Other long term (current) drug therapy: Secondary | ICD-10-CM | POA: Diagnosis not present

## 2023-12-05 DIAGNOSIS — R7989 Other specified abnormal findings of blood chemistry: Secondary | ICD-10-CM | POA: Diagnosis not present

## 2023-12-05 DIAGNOSIS — I130 Hypertensive heart and chronic kidney disease with heart failure and stage 1 through stage 4 chronic kidney disease, or unspecified chronic kidney disease: Secondary | ICD-10-CM | POA: Insufficient documentation

## 2023-12-05 DIAGNOSIS — Y828 Other medical devices associated with adverse incidents: Secondary | ICD-10-CM | POA: Diagnosis not present

## 2023-12-05 DIAGNOSIS — I5020 Unspecified systolic (congestive) heart failure: Secondary | ICD-10-CM | POA: Diagnosis not present

## 2023-12-05 DIAGNOSIS — Z7982 Long term (current) use of aspirin: Secondary | ICD-10-CM | POA: Diagnosis not present

## 2023-12-05 DIAGNOSIS — Z950 Presence of cardiac pacemaker: Secondary | ICD-10-CM | POA: Insufficient documentation

## 2023-12-05 DIAGNOSIS — T82190A Other mechanical complication of cardiac electrode, initial encounter: Secondary | ICD-10-CM | POA: Diagnosis present

## 2023-12-05 DIAGNOSIS — T82110A Breakdown (mechanical) of cardiac electrode, initial encounter: Secondary | ICD-10-CM

## 2023-12-05 DIAGNOSIS — E1122 Type 2 diabetes mellitus with diabetic chronic kidney disease: Secondary | ICD-10-CM | POA: Insufficient documentation

## 2023-12-05 DIAGNOSIS — Z720 Tobacco use: Secondary | ICD-10-CM | POA: Diagnosis not present

## 2023-12-05 DIAGNOSIS — N183 Chronic kidney disease, stage 3 unspecified: Secondary | ICD-10-CM | POA: Insufficient documentation

## 2023-12-05 LAB — BASIC METABOLIC PANEL WITH GFR
Anion gap: 14 (ref 5–15)
BUN: 40 mg/dL — ABNORMAL HIGH (ref 8–23)
CO2: 23 mmol/L (ref 22–32)
Calcium: 10.1 mg/dL (ref 8.9–10.3)
Chloride: 99 mmol/L (ref 98–111)
Creatinine, Ser: 3.05 mg/dL — ABNORMAL HIGH (ref 0.61–1.24)
GFR, Estimated: 21 mL/min — ABNORMAL LOW (ref >60)
Glucose, Bld: 89 mg/dL (ref 70–99)
Potassium: 4.2 mmol/L (ref 3.5–5.1)
Sodium: 136 mmol/L (ref 135–145)

## 2023-12-05 LAB — CBC WITH DIFFERENTIAL/PLATELET
Abs Immature Granulocytes: 0.01 10*3/uL (ref 0.00–0.07)
Basophils Absolute: 0.1 10*3/uL (ref 0.0–0.1)
Basophils Relative: 1 %
Eosinophils Absolute: 0.1 10*3/uL (ref 0.0–0.5)
Eosinophils Relative: 3 %
HCT: 43.6 % (ref 39.0–52.0)
Hemoglobin: 13.9 g/dL (ref 13.0–17.0)
Immature Granulocytes: 0 %
Lymphocytes Relative: 22 %
Lymphs Abs: 1 10*3/uL (ref 0.7–4.0)
MCH: 28.3 pg (ref 26.0–34.0)
MCHC: 31.9 g/dL (ref 30.0–36.0)
MCV: 88.6 fL (ref 80.0–100.0)
Monocytes Absolute: 1.7 10*3/uL — ABNORMAL HIGH (ref 0.1–1.0)
Monocytes Relative: 37 %
Neutro Abs: 1.7 10*3/uL (ref 1.7–7.7)
Neutrophils Relative %: 37 %
Platelets: 106 10*3/uL — ABNORMAL LOW (ref 150–400)
RBC: 4.92 MIL/uL (ref 4.22–5.81)
RDW: 14.6 % (ref 11.5–15.5)
WBC: 4.5 10*3/uL (ref 4.0–10.5)
nRBC: 0 % (ref 0.0–0.2)

## 2023-12-05 MED ORDER — SODIUM CHLORIDE 0.9 % IV BOLUS
500.0000 mL | Freq: Once | INTRAVENOUS | Status: AC
  Administered 2023-12-05: 500 mL via INTRAVENOUS

## 2023-12-05 NOTE — ED Provider Triage Note (Signed)
 Emergency Medicine Provider Triage Evaluation Note  Edward Mullen , a 68 y.o. male  was evaluated in triage.  Pt complains of broken pacemaker.  Patient states he has been asymptomatic and the pacemaker is not gone off but received a phone call from his cardiologist telling that he needed to go to the emergency room ASAP as the pacemaker was broken.  Patient is currently asymptomatic.  Review of Systems  Positive:  Negative:   Physical Exam  BP (!) 87/59 (BP Location: Left Arm)   Pulse 71   Temp 98.3 F (36.8 C) (Oral)   Resp 20   Ht 6' (1.829 m)   Wt 88 kg   SpO2 97%   BMI 26.31 kg/m  Gen:   Awake, no distress   Resp:  Normal effort  MSK:   Moves extremities without difficulty  Other:    Medical Decision Making  Medically screening exam initiated at 7:22 PM.  Appropriate orders placed.  [name] was informed that the remainder of the evaluation will be completed by another provider, this initial triage assessment does not replace that evaluation, and the importance of remaining in the ED until their evaluation is complete.  Patient is currently asymptomatic, charge nurse made aware that patient will need a room immediately as he has a broken pacemaker upon chart review.   Denese Finn, [place] 12/05/23 1923

## 2023-12-05 NOTE — ED Triage Notes (Signed)
 The pt does not know why he's here he reports that he was called earlier today and  was told that his pacemaker wires were  broken and to get to the ed asap  no chest pain or disturbances

## 2023-12-05 NOTE — ED Provider Notes (Signed)
 [place] EMERGENCY DEPARTMENT AT [facility] Provider Note   CSN: [id number] Arrival date & time: 12/05/23  1824     History  No chief complaint on file.   Edward Mullen is a 68 y.o. male with a past medical history of HTN, tobacco abuse, polysubstance abuse, nonobstructive hypertrophic cardiomyopathy, CKD stage III, T2DM, HLD, COPD, cardiac arrest, HFrEF (<20% in 2024), s/p [employer] Jude CRT-D (07/2022) presents to emergency department via cardiology recommendation for evaluation of possible pacemaker lead fracture. He reports compliance with home medications. Denies recent shocks. Denies chest pain, shob, pedal edema, presyncope/syncope, light headedness, dizziness.  HPI     Home Medications Prior to Admission medications   Medication Sig Start Date End Date Taking? Authorizing Provider  albuterol  (VENTOLIN  HFA) 108 (90 Base) MCG/ACT inhaler INHALE 1 TO 2 PUFFS BY MOUTH EVERY 6 HOURS AS NEEDED FOR WHEEZING OR SHORTNESS OF BREATH 10/29/22   Cathey Clunes, MD  amiodarone  (PACERONE ) 200 MG tablet Take 1 tablet (200 mg total) by mouth 2 (two) times daily for 10 days. Then resume 200mg  once daily. 10/02/23 10/26/24  Daren Eck, DO  ASPIRIN  LOW DOSE 81 MG tablet TAKE 1 TABLET (81 MG TOTAL) BY MOUTH DAILY. SWALLOW WHOLE(AM) 01/27/23   [place], Arlice Bene, FNP  Blood Glucose Monitoring Suppl DEVI 1 each by Does not apply route in the morning, at noon, and at bedtime. May substitute to any manufacturer covered by patient's insurance. 10/02/23   Daren Eck, DO  carvedilol  (COREG ) 3.125 MG tablet Take 1 tablet (3.125 mg total) by mouth 2 (two) times daily. 01/29/22 03/25/24  Elmarie Hacking, FNP  Cholecalciferol (VITAMIN D3) 50 MCG (2000 UT) TABS Take 2,000 Units by mouth daily.    [provider]  diphenhydramine-acetaminophen  (TYLENOL  PM) 25-500 MG TABS tablet Take 2 tablets by mouth at bedtime as needed (for sleep).    [provider]  gabapentin   (NEURONTIN ) 100 MG capsule Take 2 capsules (200 mg total) by mouth 3 (three) times daily. 12/16/22 03/25/24  Cleven Dallas, DO  gabapentin  (NEURONTIN ) 600 MG tablet Take 600 mg by mouth 2 (two) times daily.    [provider]  JARDIANCE  10 MG TABS tablet Take 10 mg by mouth daily. 04/03/23   [provider]  losartan  (COZAAR ) 25 MG tablet Take 25 mg by mouth daily.    [provider]  mexiletine (MEXITIL ) 150 MG capsule TAKE 1 CAPSULE BY MOUTH 2 (TWO) TIMES DAILY (AM+BEDTIME) 11/05/23   Darlis Eisenmenger, MD  nortriptyline  (PAMELOR ) 10 MG capsule Take 10 mg by mouth at bedtime.    [provider]  rosuvastatin  (CRESTOR ) 20 MG tablet Take 1 tablet (20 mg total) by mouth daily. 07/25/23   Darlis Eisenmenger, MD  spironolactone  (ALDACTONE ) 25 MG tablet TAKE HALF TABLET (12.5 MG TOTAL) BY MOUTH DAILY (AM) 11/03/23   Darlis Eisenmenger, MD  STIOLTO RESPIMAT 2.5-2.5 MCG/ACT AERS Inhale 2 puffs into the lungs daily. 06/11/23   [provider]  tamsulosin  (FLOMAX ) 0.4 MG CAPS capsule Take 0.4 mg by mouth at bedtime.    [provider]  torsemide  (DEMADEX ) 20 MG tablet Take 1 tablet (20 mg total) by mouth daily. 08/08/23   Darlis Eisenmenger, MD  zolpidem  (AMBIEN ) 10 MG tablet Take 1 tablet (10 mg total) by mouth at bedtime as needed for sleep. 09/16/22 01/03/23        Allergies    Patient has no known allergies.    Review of Systems  Review of Systems  Constitutional:  Negative for chills, fatigue and fever.  Respiratory:  Negative for cough, chest tightness, shortness of breath and wheezing.   Cardiovascular:  Negative for chest pain and palpitations.  Gastrointestinal:  Negative for abdominal pain, constipation, diarrhea, nausea and vomiting.  Neurological:  Negative for dizziness, seizures, weakness, light-headedness, numbness and headaches.    Physical Exam Updated Vital Signs BP (!) 87/61   Pulse 60   Temp 98.3 F (36.8 C) (Oral)   Resp 18   Ht  6' (1.829 m)   Wt 88 kg   SpO2 95%   BMI 26.31 kg/m  Physical Exam Vitals and nursing note reviewed.  Constitutional:      General: He is not in acute distress.    Appearance: Normal appearance.  HENT:     Head: Normocephalic and atraumatic.     Mouth/Throat:     Lips: Pink.     Mouth: Mucous membranes are dry.  Eyes:     Conjunctiva/sclera: Conjunctivae normal.  Cardiovascular:     Rate and Rhythm: Normal rate.     Heart sounds: Normal heart sounds. No murmur heard.    No friction rub. No gallop.     Comments: No JVD Pulmonary:     Effort: Pulmonary effort is normal. No respiratory distress.     Breath sounds: Normal breath sounds.  Chest:     Chest wall: No tenderness.  Abdominal:     General: Bowel sounds are normal. There is no distension.     Palpations: Abdomen is soft.     Tenderness: There is no abdominal tenderness. There is no guarding.  Musculoskeletal:     Right lower leg: No edema.     Left lower leg: No edema.     Comments: No swelling nor TTP of BLE. Homans sign negative x2  Skin:    General: Skin is warm.     Capillary Refill: Capillary refill takes less than 2 seconds.     Coloration: Skin is not jaundiced or pale.  Neurological:     Mental Status: He is alert and oriented to person, place, and time. Mental status is at baseline.     ED Results / Procedures / Treatments   Labs (all labs ordered are listed, but only abnormal results are displayed) Labs Reviewed  CBC WITH DIFFERENTIAL/PLATELET - Abnormal; Notable for the following components:      Result Value   Platelets 106 (*)    Monocytes Absolute 1.7 (*)    All other components within normal limits  BASIC METABOLIC PANEL WITH GFR - Abnormal; Notable for the following components:   BUN 40 (*)    Creatinine, Ser 3.05 (*)    GFR, Estimated 21 (*)    All other components within normal limits    EKG EKG Interpretation Date/Time:  Friday December 05 2023 19:17:26 EDT Ventricular Rate:  71 PR  Interval:  212 QRS Duration:  156 QT Interval:  436 QTC Calculation: 473 R Axis:   233  Text Interpretation: Atrial-sensed ventricular-paced rhythm with prolonged AV conduction Biventricular pacemaker detected Abnormal ECG When compared with ECG of 30-Sep-2023 14:17, PREVIOUS ECG IS PRESENT Confirmed by Elise Guile [id number]) on 12/05/2023 9:06:42 PM  Radiology DG Chest Portable 1 View Result Date: 12/05/2023 CLINICAL DATA:  Patient per EXAM: PORTABLE CHEST 1 VIEW COMPARISON:  Chest x-ray 09/30/2023 FINDINGS: Left-sided pacemaker is again seen. The heart is enlarged the aorta is tortuous, unchanged. There are mild patchy opacities in the left  lower lung. The lungs are otherwise clear. There is no pleural effusion or pneumothorax. No acute fractures are seen. IMPRESSION: 1. Mild patchy opacities in the left lower lung, atelectasis versus infiltrate. 2. Cardiomegaly. Electronically Signed   By: Tyron Gallon M.D.   On: 12/05/2023 19:59    Procedures Procedures    Medications Ordered in ED Medications  sodium chloride  0.9 % bolus 500 mL (0 mLs Intravenous Stopped 12/05/23 2109)    ED Course/ Medical Decision Making/ A&P Clinical Course as of 12/05/23 2335  Fri Dec 05, 2023  2007 Platelets(!): 106 Baseline 93-134 [LB]  2007 BP(!): 84/64 Slowly administering 500cc NS with hx of LVEF <20% [LB]  2028 Creatinine(!): 3.05 Baseline 1.57-2.27. 1.76 one month ago [LB]  2029 DG Chest Portable 1 View [LB]    Clinical Course User Index [LB] Royann Cords, PA                                 Medical Decision Making Amount and/or Complexity of Data Reviewed Labs: ordered. Decision-making details documented in ED Course. Radiology: ordered. Decision-making details documented in ED Course.    Co morbidities that complicate the patient evaluation  See HPI   Additional history obtained:  Additional history obtained from Nursing and Outside Medical Records   External records from  outside source obtained and reviewed including triage note, device alert   Lab Tests:  I Ordered, and personally interpreted labs.  The pertinent results include:   Creatinine 3.05 (baseline 1.42-2.27 over past 2 months with 1.11-month ago. BUN 40 Platelets 106 (baseline 93-134)    Cardiac Monitoring:  The patient was maintained on a cardiac monitor.  I personally viewed and interpreted the cardiac monitored which showed an underlying rhythm of: paced rhythm   Medicines ordered and prescription drug management:  I ordered medication including NS  for hypotension  Reevaluation of the patient after these medicines showed that the patient improved I have reviewed the patients home medicines and have made adjustments as needed    Consultations Obtained:  I requested consultation with cardiology Dr. Ossie Blend,  and discussed lab and imaging findings as well as pertinent plan - they recommend: patient can follow up with cardiology outpatient following [employer] device changing settings for false alarm of lead fracture. See cards note    Problem List / ED Course:  Hypotension Improved with NS. Last SBP 95 prior to DC Was SBP 112 during cardiology visit one month ago. Was 90 SBP during last admission in feb 2025. No complaints of dizziness, lightheadedness. Low suspicion for  BB toxicity with normal EKG and no bradycardia, nor symptoms Low suspicion for cardiogenic shock Likely due to dehydration as he appears dry on exam  Discussed patient to follow up with PCP/cardiology for BP control, monitor BP at home Discussed strict return precautions  Elevated creatinine Elevated from 1.76 to 3.05. Appears to be related to dehydration. Appears dry on exam Provided 500cc slowly for dehydration  Pacemaker lead failure XR does not show abnormal pacemaker placement Cardiology and pacemaker representative both individually assessed patient - believe alarm was due to artifact and reprogrammed  ICD/pacemaker No shocks, EKG shows it is working appropriately Will have pt f/u with cardiology   Reevaluation:  After the interventions noted above, I reevaluated the patient and found that they have :improved    Dispostion:  After consideration of the diagnostic results and the patients response to treatment, I  feel that the patent would benefit from outpatient management with cardiology f/u.   Discussed ED workup, disposition, return to ED precautions with patient who expresses understanding agrees with plan.  All questions answered to their satisfaction.  They are agreeable to plan.  Discharge instructions provided on paperwork  Discussed patient with Dr. Linder Revere who reviewed ED workup and agrees to plan Final Clinical Impression(s) / ED Diagnoses Final diagnoses:  Pacemaker lead failure, initial encounter    Rx / DC Orders ED Discharge Orders     None         Royann Cords, PA 12/05/23 2338    Rolinda Climes, DO 12/05/23 2343

## 2023-12-05 NOTE — Telephone Encounter (Addendum)
 Spoke with patient. Appointment made for 4/30 At 850am with Renee Ursuy,-PA-C to evaluate.  Patient verbalizes understanding and knows we will be in the new building.

## 2023-12-05 NOTE — Discharge Instructions (Addendum)
 Thank you for letting us  evaluate you today.  We have changed your settings on your pacemaker fixing the alarm problem.  We have also given you some IV fluids for hydration.  Please follow-up with cardiology outpatient  Return to Emergency Department if you experience chest pain, shortness of breath

## 2023-12-05 NOTE — Progress Notes (Signed)
 I was called to assess patient. Having noise detected on his device as VT/VF and puts him at risk for inappropriate shocks. [employer] rep was present at bedside and made adjustments to device. Report scanned into the chart. Rep discussed with Dr. Lawana Pray. Patient safe to be discharged home and close follow up with EP device clinic.   Koreen Person MD  Cardiology

## 2023-12-05 NOTE — Telephone Encounter (Signed)
 Message from the device clinic received regarding concerns of RV lead fracture/noise on his RV lead In review with Dr. Marven Slimmer, note that his device is infact seeing the noise and calling it in VF zone. There is risk of him getting inappropriate shocks I was able to reach the patient, he reports no symptoms, no shocks Discussed concern and risk that his device may start to shock him inappropriately, and advised that he come to the ER here at [facility] He said he would as soon as he could find a ride I will make our team aware  Nevin Barcelona, PA-C

## 2023-12-07 NOTE — Telephone Encounter (Signed)
 Noted, I see he went to ER on 4/25.  Appears plans to keep appointment on 4/30 in clinic for follow up.

## 2023-12-08 ENCOUNTER — Other Ambulatory Visit (HOSPITAL_COMMUNITY): Payer: Self-pay | Admitting: Cardiology

## 2023-12-08 DIAGNOSIS — I5022 Chronic systolic (congestive) heart failure: Secondary | ICD-10-CM

## 2023-12-10 ENCOUNTER — Encounter: Admitting: Physician Assistant

## 2023-12-10 NOTE — Telephone Encounter (Signed)
 Patient's appointment moved from [place] on 4/25 to Dr. Marven Slimmer on 5/5 per physician's recommendation.  Patient aware.

## 2023-12-14 NOTE — H&P (View-Only) (Signed)
 Electrophysiology Office Follow up Visit Note:    Date:  12/15/2023   ID:  Edward Mullen, DOB 05/11/1956, MRN [id number]  PCP:  Edward Lose, NP  [facility] Cardiologist:  None  [facility] Electrophysiologist:  Richardo Chandler, MD    Interval History:     Edward Mullen is a 68 y.o. male who presents for a follow up visit.   The patient was previously seen by Dr. Rodolfo Mullen.  The most recent appoint with Dr. Rodolfo Mullen was in November 2023.  He has an ICD in situ.  He has a history of chronic systolic heart failure, diabetes, hypertension, polysubstance use and prior tobacco abuse.  His CRT-D was implanted in December 2023.  In April 2024 he had a out-of-hospital cardiac arrest.  He has had successful ATP for sustained ventricular tachycardia.  He has been treated with amiodarone .  His care has been generally complicated by his polysubstance abuse.  We received an alert from his device on December 04, 2023 for RV noise reversion.  There was oversensing of noise on the ventricular lead associated with a sudden increase in RV impedance.  He is doing okay today.       Past medical, surgical, social and family history were reviewed.  ROS:   Please see the history of present illness.    All other systems reviewed and are negative.  EKGs/Labs/Other Studies Reviewed:    The following studies were reviewed today:  March 11, 2023 echo EF less than 20% RV mildly reduced function Dilated left and right atrium Mild MR    July 17, 2022 CRT-D implant note reports a single coil active-fixation RV ICD lead.   November 24, 2022 chest x-ray    Dec 15, 2023 in-clinic device interrogation personally reviewed Battery longevity 6 years BiV pacing 97% RV pace/sense impedance 1100 ohms today and then on repeat is 450 ohms.  Sensing is down as well today.  I am not able to recreate any noise with pocket manipulation. I reprogrammed his sensing of VT and VF to prolong detection to minimize  the chances of an inappropriate shock.        Physical Exam:    VS:  BP 98/60   Pulse 66   Ht 6' (1.829 m)   Wt 194 lb (88 kg)   BMI 26.31 kg/m     Wt Readings from Last 3 Encounters:  12/15/23 194 lb (88 kg)  12/05/23 194 lb 0.1 oz (88 kg)  10/31/23 194 lb (88 kg)     GEN: no distress CARD: RRR, No MRG.  CIED pocket well-healed RESP: No IWOB. CTAB.      ASSESSMENT:    1. Chronic systolic heart failure (HCC)   2. History of cardiac arrest   3. Abnormal lead impedance of cardiac resynchronization therapy defibrillator (CRT-D)   4. Presence of cardiac resynchronization therapy defibrillator (CRT-D)   5. History of ventricular tachycardia   6. Encounter for long-term (current) use of high-risk medication    PLAN:    In order of problems listed above:  #CRT-D in situ #RV ICD lead fracture The patient has a history of chronic systolic heart failure with an ICD in situ.  He has required appropriate therapies for ventricular tachycardia in the past.  His RV lead demonstrates a sudden increase in RV impedance associated with RV noise and ventricular oversensing.  He is at risk for inappropriate shocks.  There is also concern that his defibrillator may not be out of the  liver appropriate therapy.  He will require system revision.  I discussed options with the patient including abandonment of his current RV ICD lead with addition of a new lead.  Also discussed extraction of the patient's existing lead with replacement.  Given the age of the RV lead, favor extraction and replacement.  I discussed the procedure in detail including the risks and recovery and he wishes to proceed.  Risks, benefits, alternatives to lead extraction/reimplantation were discussed in detail with the patient today. The patient understands that the risks include but are not limited to bleeding, infection, pneumothorax, perforation, tamponade, vascular damage, renal failure, MI, stroke, death, inappropriate  shocks, severe bleeding requiring emergent open heart surgery and lead dislodgement and wishes to proceed.  We will therefore schedule the procedure at the next available time.  Plan for CT scan prior to the procedure.  #Chronic systolic heart failure NYHA class II-III.  Continue torsemide , spironolactone , losartan , Jardiance , Coreg   #History of ventricular tachycardia ICD as above Continue amiodarone  and mexiletine February LFTs and TSH acceptable for ongoing amiodarone  use    Signed, Edward Liner, MD, [facility], [facility] 12/15/2023 9:58 AM    Electrophysiology [facility]

## 2023-12-14 NOTE — Progress Notes (Unsigned)
 Electrophysiology Office Follow up Visit Note:    Date:  12/15/2023   ID:  Edward Mullen, DOB 05/11/1956, MRN [id number]  PCP:  Hershell Lose, NP  [facility] Cardiologist:  None  [facility] Electrophysiologist:  Richardo Chandler, MD    Interval History:     Edward Mullen is a 68 y.o. male who presents for a follow up visit.   The patient was previously seen by Dr. Rodolfo Clan.  The most recent appoint with Dr. Rodolfo Clan was in November 2023.  He has an ICD in situ.  He has a history of chronic systolic heart failure, diabetes, hypertension, polysubstance use and prior tobacco abuse.  His CRT-D was implanted in December 2023.  In April 2024 he had a out-of-hospital cardiac arrest.  He has had successful ATP for sustained ventricular tachycardia.  He has been treated with amiodarone .  His care has been generally complicated by his polysubstance abuse.  We received an alert from his device on December 04, 2023 for RV noise reversion.  There was oversensing of noise on the ventricular lead associated with a sudden increase in RV impedance.  He is doing okay today.       Past medical, surgical, social and family history were reviewed.  ROS:   Please see the history of present illness.    All other systems reviewed and are negative.  EKGs/Labs/Other Studies Reviewed:    The following studies were reviewed today:  March 11, 2023 echo EF less than 20% RV mildly reduced function Dilated left and right atrium Mild MR    July 17, 2022 CRT-D implant note reports a single coil active-fixation RV ICD lead.   November 24, 2022 chest x-ray    Dec 15, 2023 in-clinic device interrogation personally reviewed Battery longevity 6 years BiV pacing 97% RV pace/sense impedance 1100 ohms today and then on repeat is 450 ohms.  Sensing is down as well today.  I am not able to recreate any noise with pocket manipulation. I reprogrammed his sensing of VT and VF to prolong detection to minimize  the chances of an inappropriate shock.        Physical Exam:    VS:  BP 98/60   Pulse 66   Ht 6' (1.829 m)   Wt 194 lb (88 kg)   BMI 26.31 kg/m     Wt Readings from Last 3 Encounters:  12/15/23 194 lb (88 kg)  12/05/23 194 lb 0.1 oz (88 kg)  10/31/23 194 lb (88 kg)     GEN: no distress CARD: RRR, No MRG.  CIED pocket well-healed RESP: No IWOB. CTAB.      ASSESSMENT:    1. Chronic systolic heart failure (HCC)   2. History of cardiac arrest   3. Abnormal lead impedance of cardiac resynchronization therapy defibrillator (CRT-D)   4. Presence of cardiac resynchronization therapy defibrillator (CRT-D)   5. History of ventricular tachycardia   6. Encounter for long-term (current) use of high-risk medication    PLAN:    In order of problems listed above:  #CRT-D in situ #RV ICD lead fracture The patient has a history of chronic systolic heart failure with an ICD in situ.  He has required appropriate therapies for ventricular tachycardia in the past.  His RV lead demonstrates a sudden increase in RV impedance associated with RV noise and ventricular oversensing.  He is at risk for inappropriate shocks.  There is also concern that his defibrillator may not be out of the  liver appropriate therapy.  He will require system revision.  I discussed options with the patient including abandonment of his current RV ICD lead with addition of a new lead.  Also discussed extraction of the patient's existing lead with replacement.  Given the age of the RV lead, favor extraction and replacement.  I discussed the procedure in detail including the risks and recovery and he wishes to proceed.  Risks, benefits, alternatives to lead extraction/reimplantation were discussed in detail with the patient today. The patient understands that the risks include but are not limited to bleeding, infection, pneumothorax, perforation, tamponade, vascular damage, renal failure, MI, stroke, death, inappropriate  shocks, severe bleeding requiring emergent open heart surgery and lead dislodgement and wishes to proceed.  We will therefore schedule the procedure at the next available time.  Plan for CT scan prior to the procedure.  #Chronic systolic heart failure NYHA class II-III.  Continue torsemide , spironolactone , losartan , Jardiance , Coreg   #History of ventricular tachycardia ICD as above Continue amiodarone  and mexiletine February LFTs and TSH acceptable for ongoing amiodarone  use    Signed, Harvie Liner, MD, [facility], [facility] 12/15/2023 9:58 AM    Electrophysiology [facility]

## 2023-12-15 ENCOUNTER — Other Ambulatory Visit: Payer: Self-pay

## 2023-12-15 ENCOUNTER — Ambulatory Visit: Attending: Cardiology | Admitting: Cardiology

## 2023-12-15 VITALS — BP 98/60 | HR 66 | Ht 72.0 in | Wt 194.0 lb

## 2023-12-15 DIAGNOSIS — Z8679 Personal history of other diseases of the circulatory system: Secondary | ICD-10-CM

## 2023-12-15 DIAGNOSIS — Z95810 Presence of automatic (implantable) cardiac defibrillator: Secondary | ICD-10-CM

## 2023-12-15 DIAGNOSIS — Z8674 Personal history of sudden cardiac arrest: Secondary | ICD-10-CM

## 2023-12-15 DIAGNOSIS — Z79899 Other long term (current) drug therapy: Secondary | ICD-10-CM

## 2023-12-15 DIAGNOSIS — T82110A Breakdown (mechanical) of cardiac electrode, initial encounter: Secondary | ICD-10-CM

## 2023-12-15 DIAGNOSIS — I5022 Chronic systolic (congestive) heart failure: Secondary | ICD-10-CM | POA: Diagnosis not present

## 2023-12-15 LAB — CUP PACEART INCLINIC DEVICE CHECK
Battery Remaining Longevity: 73 mo
Brady Statistic RA Percent Paced: 23 %
Brady Statistic RV Percent Paced: 97 %
Date Time Interrogation Session: 20250505104912
HighPow Impedance: 77.625
HighPow Impedance: 78 Ohm
Implantable Lead Connection Status: 753985
Implantable Lead Connection Status: 753985
Implantable Lead Connection Status: 753985
Implantable Lead Implant Date: 20231206
Implantable Lead Implant Date: 20231206
Implantable Lead Implant Date: 20231206
Implantable Lead Location: 753858
Implantable Lead Location: 753859
Implantable Lead Location: 753860
Implantable Lead Model: 7122
Implantable Pulse Generator Implant Date: 20231206
Lead Channel Impedance Value: 1087.5 Ohm
Lead Channel Impedance Value: 412.5 Ohm
Lead Channel Impedance Value: 800 Ohm
Lead Channel Pacing Threshold Amplitude: 0.5 V
Lead Channel Pacing Threshold Amplitude: 0.5 V
Lead Channel Pacing Threshold Amplitude: 0.75 V
Lead Channel Pacing Threshold Amplitude: 0.75 V
Lead Channel Pacing Threshold Amplitude: 1 V
Lead Channel Pacing Threshold Amplitude: 1 V
Lead Channel Pacing Threshold Pulse Width: 0.5 ms
Lead Channel Pacing Threshold Pulse Width: 0.5 ms
Lead Channel Pacing Threshold Pulse Width: 0.5 ms
Lead Channel Pacing Threshold Pulse Width: 0.5 ms
Lead Channel Pacing Threshold Pulse Width: 1 ms
Lead Channel Pacing Threshold Pulse Width: 1 ms
Lead Channel Sensing Intrinsic Amplitude: 5 mV
Lead Channel Sensing Intrinsic Amplitude: 5.9 mV
Lead Channel Setting Pacing Amplitude: 1.5 V
Lead Channel Setting Pacing Amplitude: 2 V
Lead Channel Setting Pacing Amplitude: 2 V
Lead Channel Setting Pacing Pulse Width: 0.5 ms
Lead Channel Setting Pacing Pulse Width: 1 ms
Lead Channel Setting Sensing Sensitivity: 0.5 mV
Pulse Gen Serial Number: 5556126
Zone Setting Status: 755011

## 2023-12-15 NOTE — Patient Instructions (Addendum)
 Medication Instructions:  Your physician recommends that you continue on your current medications as directed. Please refer to the Current Medication list given to you today.  *If you need a refill on your cardiac medications before your next appointment, please call your pharmacy*  Lab Work: TODAY: BMET and CBC   Testing/Procedures: CT scan   Lead Revision - see instruction letter  Follow-Up: At [facility], you and your health needs are our priority.  As part of our continuing mission to provide you with exceptional heart care, our providers are all part of one team.  This team includes your primary Cardiologist (physician) and Advanced Practice Providers or APPs (Physician Assistants and Nurse Practitioners) who all work together to provide you with the care you need, when you need it.  Your next appointment:   We will call you to arrange your post-procedure appointments

## 2023-12-16 LAB — CBC
Hematocrit: 41.5 % (ref 37.5–51.0)
Hemoglobin: 13.5 g/dL (ref 13.0–17.7)
MCH: 28 pg (ref 26.6–33.0)
MCHC: 32.5 g/dL (ref 31.5–35.7)
MCV: 86 fL (ref 79–97)
Platelets: 116 10*3/uL — ABNORMAL LOW (ref 150–450)
RBC: 4.82 x10E6/uL (ref 4.14–5.80)
RDW: 13.6 % (ref 11.6–15.4)
WBC: 4.2 10*3/uL (ref 3.4–10.8)

## 2023-12-16 LAB — BASIC METABOLIC PANEL WITH GFR
BUN/Creatinine Ratio: 15 (ref 10–24)
BUN: 41 mg/dL — ABNORMAL HIGH (ref 8–27)
CO2: 17 mmol/L — ABNORMAL LOW (ref 20–29)
Calcium: 9.9 mg/dL (ref 8.6–10.2)
Chloride: 104 mmol/L (ref 96–106)
Creatinine, Ser: 2.68 mg/dL — ABNORMAL HIGH (ref 0.76–1.27)
Glucose: 83 mg/dL (ref 70–99)
Potassium: 4.2 mmol/L (ref 3.5–5.2)
Sodium: 140 mmol/L (ref 134–144)
eGFR: 25 mL/min/{1.73_m2} — ABNORMAL LOW (ref >59)

## 2023-12-17 ENCOUNTER — Telehealth: Payer: Self-pay

## 2023-12-17 DIAGNOSIS — I5022 Chronic systolic (congestive) heart failure: Secondary | ICD-10-CM

## 2023-12-17 NOTE — Telephone Encounter (Signed)
 The patient has been notified of the result and verbalized understanding.  All questions (if any) were answered. Livana Yerian Chauvigne, RN 12/17/2023 9:17 AM  Labs have been ordered.

## 2023-12-17 NOTE — Telephone Encounter (Signed)
-----   Message from [facility]ERON [facility] sent at 12/17/2023  8:13 AM EDT ----- Luther Saltness, please have him stop the losartan  and spironolactone .  He needs a repeat BMP in 4-6 weeks.  Thanks,  Donelda Fujita T. Marven Slimmer, MD, [facility], [facility] Cardiac Electrophysiology

## 2023-12-18 ENCOUNTER — Telehealth: Payer: Self-pay

## 2023-12-18 ENCOUNTER — Encounter (HOSPITAL_COMMUNITY): Payer: Self-pay | Admitting: Cardiology

## 2023-12-18 NOTE — Progress Notes (Signed)
 PCP - Hershell Lose, NP Cardiologist - Dr Mitzie Anda EP - Dr Harvie Liner  Chest x-ray - 12/05/23 EKG - 12/05/23 Stress Test - n/a ECHO - 03/11/23 Cardiac Cath - 10/10/21  ICD Pacemaker/Loop - [employer]  Sleep Study -  n/a  Diabetes Type 2 - Hold Jardiance  3 days prior to procedure per MD.  Last dose should be on 12/18/23.  Blood Thinner Instructions:  n/a  Aspirin  Instructions: Hold ASA 5 days prior to procedure per MD.  Last dose should be on 12/16/23.  NPO  No meds on DOS.

## 2023-12-18 NOTE — Telephone Encounter (Signed)
 Called pt and informed him that his Lead Extraction with Dr. Marven Slimmer on 5/12 has been moved from 12:00 to 7:30 AM. He is aware that he needs to arrive by 5:30 AM and that he must be NPO after midnight.

## 2023-12-22 ENCOUNTER — Ambulatory Visit (HOSPITAL_BASED_OUTPATIENT_CLINIC_OR_DEPARTMENT_OTHER): Payer: Self-pay | Admitting: Certified Registered Nurse Anesthetist

## 2023-12-22 ENCOUNTER — Ambulatory Visit (HOSPITAL_COMMUNITY)

## 2023-12-22 ENCOUNTER — Ambulatory Visit (HOSPITAL_COMMUNITY)
Admission: RE | Admit: 2023-12-22 | Discharge: 2023-12-23 | Disposition: A | Discharge status: Home / Self Care | Attending: Cardiology | Admitting: Cardiology

## 2023-12-22 ENCOUNTER — Other Ambulatory Visit: Payer: Self-pay

## 2023-12-22 ENCOUNTER — Ambulatory Visit (HOSPITAL_COMMUNITY)
Admission: RE | Disposition: A | Discharge status: Home / Self Care | Payer: Self-pay | Source: Home / Self Care | Attending: Cardiology

## 2023-12-22 ENCOUNTER — Ambulatory Visit (HOSPITAL_COMMUNITY): Payer: Self-pay | Admitting: Certified Registered Nurse Anesthetist

## 2023-12-22 ENCOUNTER — Encounter (HOSPITAL_COMMUNITY): Payer: Self-pay | Admitting: Cardiology

## 2023-12-22 DIAGNOSIS — T82110A Breakdown (mechanical) of cardiac electrode, initial encounter: Secondary | ICD-10-CM | POA: Insufficient documentation

## 2023-12-22 DIAGNOSIS — E119 Type 2 diabetes mellitus without complications: Secondary | ICD-10-CM | POA: Diagnosis not present

## 2023-12-22 DIAGNOSIS — Z95810 Presence of automatic (implantable) cardiac defibrillator: Secondary | ICD-10-CM | POA: Insufficient documentation

## 2023-12-22 DIAGNOSIS — I2510 Atherosclerotic heart disease of native coronary artery without angina pectoris: Secondary | ICD-10-CM | POA: Insufficient documentation

## 2023-12-22 DIAGNOSIS — Z7982 Long term (current) use of aspirin: Secondary | ICD-10-CM | POA: Insufficient documentation

## 2023-12-22 DIAGNOSIS — Z79899 Other long term (current) drug therapy: Secondary | ICD-10-CM | POA: Diagnosis not present

## 2023-12-22 DIAGNOSIS — I5022 Chronic systolic (congestive) heart failure: Secondary | ICD-10-CM | POA: Diagnosis not present

## 2023-12-22 DIAGNOSIS — Z8674 Personal history of sudden cardiac arrest: Secondary | ICD-10-CM | POA: Diagnosis not present

## 2023-12-22 DIAGNOSIS — N183 Chronic kidney disease, stage 3 unspecified: Secondary | ICD-10-CM | POA: Insufficient documentation

## 2023-12-22 DIAGNOSIS — T82198A Other mechanical complication of other cardiac electronic device, initial encounter: Secondary | ICD-10-CM | POA: Diagnosis present

## 2023-12-22 DIAGNOSIS — T82118A Breakdown (mechanical) of other cardiac electronic device, initial encounter: Secondary | ICD-10-CM | POA: Diagnosis not present

## 2023-12-22 DIAGNOSIS — Z7984 Long term (current) use of oral hypoglycemic drugs: Secondary | ICD-10-CM | POA: Diagnosis not present

## 2023-12-22 DIAGNOSIS — E1122 Type 2 diabetes mellitus with diabetic chronic kidney disease: Secondary | ICD-10-CM | POA: Insufficient documentation

## 2023-12-22 DIAGNOSIS — Y831 Surgical operation with implant of artificial internal device as the cause of abnormal reaction of the patient, or of later complication, without mention of misadventure at the time of the procedure: Secondary | ICD-10-CM | POA: Diagnosis not present

## 2023-12-22 DIAGNOSIS — F17200 Nicotine dependence, unspecified, uncomplicated: Secondary | ICD-10-CM | POA: Diagnosis not present

## 2023-12-22 DIAGNOSIS — Z01818 Encounter for other preprocedural examination: Secondary | ICD-10-CM

## 2023-12-22 DIAGNOSIS — I428 Other cardiomyopathies: Secondary | ICD-10-CM | POA: Insufficient documentation

## 2023-12-22 DIAGNOSIS — J449 Chronic obstructive pulmonary disease, unspecified: Secondary | ICD-10-CM

## 2023-12-22 DIAGNOSIS — I5020 Unspecified systolic (congestive) heart failure: Secondary | ICD-10-CM

## 2023-12-22 DIAGNOSIS — I130 Hypertensive heart and chronic kidney disease with heart failure and stage 1 through stage 4 chronic kidney disease, or unspecified chronic kidney disease: Secondary | ICD-10-CM | POA: Insufficient documentation

## 2023-12-22 HISTORY — PX: TRANSESOPHAGEAL ECHOCARDIOGRAM (CATH LAB): EP1270

## 2023-12-22 HISTORY — PX: LEAD INSERTION: EP1212

## 2023-12-22 HISTORY — PX: LEAD EXTRACTION: EP1211

## 2023-12-22 LAB — GLUCOSE, CAPILLARY
Glucose-Capillary: 76 mg/dL (ref 70–99)
Glucose-Capillary: 189 mg/dL — ABNORMAL HIGH (ref 70–99)

## 2023-12-22 LAB — ECHO INTRAOPERATIVE TEE
Height: 72 in
Weight: 3120 [oz_av]

## 2023-12-22 LAB — PREPARE RBC (CROSSMATCH)

## 2023-12-22 MED ORDER — MIDAZOLAM HCL 2 MG/2ML IJ SOLN
INTRAMUSCULAR | Status: DC | PRN
  Administered 2023-12-22 (×2): 1 mg via INTRAVENOUS

## 2023-12-22 MED ORDER — FENTANYL CITRATE (PF) 100 MCG/2ML IJ SOLN
INTRAMUSCULAR | Status: AC
  Filled 2023-12-22: qty 2

## 2023-12-22 MED ORDER — SODIUM CHLORIDE 0.9 % IV SOLN
INTRAVENOUS | Status: DC
Start: 2023-12-22 — End: 2023-12-22

## 2023-12-22 MED ORDER — SODIUM CHLORIDE 0.9% IV SOLUTION: Freq: Once | INTRAVENOUS | Status: DC

## 2023-12-22 MED ORDER — ONDANSETRON HCL 4 MG/2ML IJ SOLN: 4.0000 mg | Freq: Four times a day (QID) | INTRAMUSCULAR | Status: DC | PRN

## 2023-12-22 MED ORDER — SODIUM CHLORIDE 0.9 % IV SOLN
INTRAVENOUS | Status: DC | PRN
Start: 2023-12-22 — End: 2023-12-22

## 2023-12-22 MED ORDER — MEXILETINE HCL 150 MG PO CAPS
150.0000 mg | ORAL_CAPSULE | Freq: Two times a day (BID) | ORAL | Status: DC
  Administered 2023-12-22 – 2023-12-23 (×2): 150 mg via ORAL
  Filled 2023-12-22 (×2): qty 1

## 2023-12-22 MED ORDER — SODIUM CHLORIDE 0.9 % IV SOLN
INTRAVENOUS | Status: AC
  Filled 2023-12-22: qty 2

## 2023-12-22 MED ORDER — MIDAZOLAM HCL 2 MG/2ML IJ SOLN
INTRAMUSCULAR | Status: AC
  Filled 2023-12-22: qty 2

## 2023-12-22 MED ORDER — NOREPINEPHRINE 4 MG/250ML-% IV SOLN
INTRAVENOUS | Status: DC | PRN
  Administered 2023-12-22: 2 ug/min via INTRAVENOUS

## 2023-12-22 MED ORDER — PROPOFOL 10 MG/ML IV BOLUS
INTRAVENOUS | Status: DC | PRN
  Administered 2023-12-22: 70 mg via INTRAVENOUS

## 2023-12-22 MED ORDER — ACETAMINOPHEN 325 MG PO TABS
325.0000 mg | ORAL_TABLET | ORAL | Status: DC | PRN
  Administered 2023-12-23: 650 mg via ORAL
  Filled 2023-12-22: qty 2

## 2023-12-22 MED ORDER — SODIUM CHLORIDE 0.9 % IV SOLN
80.0000 mg | INTRAVENOUS | Status: AC
  Administered 2023-12-22: 80 mg

## 2023-12-22 MED ORDER — CEFAZOLIN SODIUM-DEXTROSE 2-4 GM/100ML-% IV SOLN
INTRAVENOUS | Status: AC
  Filled 2023-12-22: qty 100

## 2023-12-22 MED ORDER — SODIUM CHLORIDE 0.9 % IV SOLN: INTRAVENOUS | Status: DC

## 2023-12-22 MED ORDER — LOSARTAN POTASSIUM 25 MG PO TABS
25.0000 mg | ORAL_TABLET | Freq: Every day | ORAL | Status: DC
  Administered 2023-12-23: 25 mg via ORAL
  Filled 2023-12-22: qty 1

## 2023-12-22 MED ORDER — CEFAZOLIN SODIUM-DEXTROSE 2-4 GM/100ML-% IV SOLN
2.0000 g | INTRAVENOUS | Status: AC
  Administered 2023-12-22: 2 g via INTRAVENOUS

## 2023-12-22 MED ORDER — CHLORHEXIDINE GLUCONATE 0.12 % MT SOLN
OROMUCOSAL | Status: AC
  Administered 2023-12-22: 15 mL
  Filled 2023-12-22: qty 15

## 2023-12-22 MED ORDER — FENTANYL CITRATE (PF) 100 MCG/2ML IJ SOLN
25.0000 ug | INTRAMUSCULAR | Status: DC | PRN
  Administered 2023-12-22: 50 ug via INTRAVENOUS

## 2023-12-22 MED ORDER — CHLORHEXIDINE GLUCONATE 4 % EX SOLN
4.0000 | Freq: Once | CUTANEOUS | Status: DC
  Administered 2023-12-22: 4 via TOPICAL

## 2023-12-22 MED ORDER — ROCURONIUM BROMIDE 10 MG/ML (PF) SYRINGE
PREFILLED_SYRINGE | INTRAVENOUS | Status: DC | PRN
  Administered 2023-12-22: 30 mg via INTRAVENOUS
  Administered 2023-12-22: 50 mg via INTRAVENOUS
  Administered 2023-12-22: 20 mg via INTRAVENOUS

## 2023-12-22 MED ORDER — CARVEDILOL 3.125 MG PO TABS
3.1250 mg | ORAL_TABLET | Freq: Two times a day (BID) | ORAL | Status: DC
  Administered 2023-12-22 – 2023-12-23 (×2): 3.125 mg via ORAL
  Filled 2023-12-22 (×2): qty 1

## 2023-12-22 MED ORDER — LACTATED RINGERS IV SOLN: INTRAVENOUS | Status: DC | PRN

## 2023-12-22 MED ORDER — SUGAMMADEX SODIUM 200 MG/2ML IV SOLN
INTRAVENOUS | Status: DC | PRN
  Administered 2023-12-22: 200 mg via INTRAVENOUS

## 2023-12-22 MED ORDER — DROPERIDOL 2.5 MG/ML IJ SOLN: 0.6250 mg | Freq: Once | INTRAMUSCULAR | Status: DC | PRN

## 2023-12-22 MED ORDER — EMPAGLIFLOZIN 10 MG PO TABS
10.0000 mg | ORAL_TABLET | Freq: Every day | ORAL | Status: DC
  Administered 2023-12-23: 10 mg via ORAL
  Filled 2023-12-22: qty 1

## 2023-12-22 MED ORDER — LIDOCAINE 2% (20 MG/ML) 5 ML SYRINGE
INTRAMUSCULAR | Status: DC | PRN
  Administered 2023-12-22: 80 mg via INTRAVENOUS

## 2023-12-22 MED ORDER — HEPARIN (PORCINE) IN NACL 1000-0.9 UT/500ML-% IV SOLN
INTRAVENOUS | Status: DC | PRN
  Administered 2023-12-22: 500 mL

## 2023-12-22 MED ORDER — AMIODARONE HCL 200 MG PO TABS
200.0000 mg | ORAL_TABLET | Freq: Every day | ORAL | Status: DC
  Administered 2023-12-23: 200 mg via ORAL
  Filled 2023-12-22: qty 1

## 2023-12-22 MED ORDER — DEXAMETHASONE SODIUM PHOSPHATE 10 MG/ML IJ SOLN
INTRAMUSCULAR | Status: DC | PRN
  Administered 2023-12-22: 10 mg via INTRAVENOUS

## 2023-12-22 MED ORDER — POVIDONE-IODINE 10 % EX SWAB
2.0000 | Freq: Once | CUTANEOUS | Status: AC
  Administered 2023-12-22: 2 via TOPICAL

## 2023-12-22 MED ORDER — NORTRIPTYLINE HCL 25 MG PO CAPS
25.0000 mg | ORAL_CAPSULE | Freq: Every day | ORAL | Status: DC
  Administered 2023-12-23: 25 mg via ORAL
  Filled 2023-12-22: qty 1

## 2023-12-22 MED ORDER — TORSEMIDE 20 MG PO TABS
20.0000 mg | ORAL_TABLET | Freq: Every day | ORAL | Status: DC
  Administered 2023-12-23: 20 mg via ORAL
  Filled 2023-12-22: qty 1

## 2023-12-22 MED ORDER — ONDANSETRON HCL 4 MG/2ML IJ SOLN
INTRAMUSCULAR | Status: DC | PRN
  Administered 2023-12-22: 4 mg via INTRAVENOUS

## 2023-12-22 MED ORDER — GABAPENTIN 300 MG PO CAPS
600.0000 mg | ORAL_CAPSULE | Freq: Two times a day (BID) | ORAL | Status: DC
  Administered 2023-12-22 – 2023-12-23 (×2): 600 mg via ORAL
  Filled 2023-12-22 (×2): qty 2

## 2023-12-22 MED ORDER — GENTAMICIN IN SALINE 1.6-0.9 MG/ML-% IV SOLN
INTRAVENOUS | Status: AC
Start: 2023-12-22 — End: 2023-12-23
  Filled 2023-12-22: qty 50

## 2023-12-22 MED ORDER — FENTANYL CITRATE (PF) 250 MCG/5ML IJ SOLN
INTRAMUSCULAR | Status: DC | PRN
  Administered 2023-12-22 (×4): 50 ug via INTRAVENOUS

## 2023-12-22 NOTE — Interval H&P Note (Signed)
 History and Physical Interval Note:  12/22/2023 7:17 AM  Edward Mullen  has presented today for surgery, with the diagnosis of lead malfunction.  The various methods of treatment have been discussed with the patient and family. After consideration of risks, benefits and other options for treatment, the patient has consented to  Procedure(s): LEAD EXTRACTION (N/A) LEAD INSERTION (N/A) as a surgical intervention.  The patient's history has been reviewed, patient examined, no change in status, stable for surgery.  I have reviewed the patient's chart and labs.  Questions were answered to the patient's satisfaction.     [name]

## 2023-12-22 NOTE — Plan of Care (Signed)

## 2023-12-22 NOTE — Transfer of Care (Signed)
 Immediate Anesthesia Transfer of Care Note  Patient: Edward Mullen  Procedure(s) Performed: LEAD EXTRACTION LEAD INSERTION TRANSESOPHAGEAL ECHOCARDIOGRAM  Patient Location: PACU  Anesthesia Type:General  Level of Consciousness: awake, alert , and oriented  Airway & Oxygen Therapy: Patient Spontanous Breathing  Post-op Assessment: Report given to RN, Post -op Vital signs reviewed and stable, and Patient moving all extremities  Post vital signs: Reviewed and stable  Last Vitals:  Vitals Value Taken Time  BP 137/96 (108) 12/22/23 11:11 am  Temp    Pulse 63 12/22/23  11:11 am  Resp 15 12/22/23  11:11 am  SpO2 100 12/22/23  11:11 am    Last Pain:  Vitals:   12/22/23 0613  TempSrc:   PainSc: 0-No pain         Complications: There were no known notable events for this encounter.

## 2023-12-22 NOTE — Anesthesia Preprocedure Evaluation (Signed)
 Anesthesia Evaluation  Patient identified by MRN, date of birth, ID band Patient awake    Reviewed: Allergy & Precautions, NPO status , Patient's Chart, lab work & pertinent test results  Airway Mallampati: II  TM Distance: >3 FB Neck ROM: Full    Dental  (+) Dental Advisory Given   Pulmonary COPD, Current Smoker   breath sounds clear to auscultation       Cardiovascular hypertension, Pt. on medications and Pt. on home beta blockers + CAD and +CHF  + dysrhythmias Ventricular Tachycardia + Valvular Problems/Murmurs  Rhythm:Regular Rate:Normal     Neuro/Psych Seizures -,     GI/Hepatic negative GI ROS, Neg liver ROS,,,  Endo/Other  diabetes, Type 2    Renal/GU CRFRenal disease     Musculoskeletal  (+) Arthritis ,    Abdominal   Peds  Hematology  (+) Blood dyscrasia, anemia   Anesthesia Other Findings   Reproductive/Obstetrics                             Anesthesia Physical Anesthesia Plan  ASA: 4  Anesthesia Plan: General   Post-op Pain Management: Tylenol  PO (pre-op)*   Induction: Intravenous  PONV Risk Score and Plan: 1 and Dexamethasone , Ondansetron  and Treatment may vary due to age or medical condition  Airway Management Planned: Oral ETT  Additional Equipment: Arterial line  Intra-op Plan:   Post-operative Plan: Extubation in OR  Informed Consent: I have reviewed the patients History and Physical, chart, labs and discussed the procedure including the risks, benefits and alternatives for the proposed anesthesia with the patient or authorized representative who has indicated his/her understanding and acceptance.     Dental advisory given  Plan Discussed with: CRNA  Anesthesia Plan Comments:        Anesthesia Quick Evaluation

## 2023-12-22 NOTE — Anesthesia Procedure Notes (Signed)
 Procedure Name: Intubation Date/Time: 12/22/2023 7:57 AM  Performed by: Pasty Bongo, CRNAPre-anesthesia Checklist: Patient identified, Emergency Drugs available, Suction available and Patient being monitored Patient Re-evaluated:Patient Re-evaluated prior to induction Oxygen Delivery Method: Circle System Utilized Preoxygenation: Pre-oxygenation with 100% oxygen Induction Type: IV induction Ventilation: Mask ventilation without difficulty Laryngoscope Size: Mac and 4 Grade View: Grade I Tube type: Oral Tube size: 8.0 mm Number of attempts: 1 Airway Equipment and Method: Stylet and Oral airway Placement Confirmation: ETT inserted through vocal cords under direct vision, positive ETCO2 and breath sounds checked- equal and bilateral Secured at: 23 cm Tube secured with: Tape Dental Injury: Teeth and Oropharynx as per pre-operative assessment

## 2023-12-22 NOTE — Anesthesia Procedure Notes (Signed)
 Arterial Line Insertion Start/End5/07/2024 6:45 AM, 12/22/2023 6:50 AM Performed by: Peggy Bowens, MD, Robert Chimes, CRNA, CRNA  Patient location: Pre-op. Preanesthetic checklist: patient identified, IV checked, site marked, risks and benefits discussed, surgical consent, monitors and equipment checked, pre-op evaluation, timeout performed and anesthesia consent Lidocaine  1% used for infiltration Right, radial was placed Catheter size: 20 G  Attempts: 1 Procedure performed without using ultrasound guided technique. Following insertion, Biopatch and dressing applied. Post procedure assessment: normal  Patient tolerated the procedure well with no immediate complications.

## 2023-12-22 NOTE — Anesthesia Postprocedure Evaluation (Signed)
 Anesthesia Post Note  Patient: Edward Mullen  Procedure(s) Performed: LEAD EXTRACTION LEAD INSERTION TRANSESOPHAGEAL ECHOCARDIOGRAM     Patient location during evaluation: PACU Anesthesia Type: General Level of consciousness: awake and alert Pain management: pain level controlled Vital Signs Assessment: post-procedure vital signs reviewed and stable Respiratory status: spontaneous breathing, nonlabored ventilation, respiratory function stable and patient connected to nasal cannula oxygen Cardiovascular status: blood pressure returned to baseline and stable Postop Assessment: no apparent nausea or vomiting Anesthetic complications: no  There were no known notable events for this encounter.  Last Vitals:  Vitals:   12/22/23 1234 12/22/23 1235  BP:    Pulse: 64 63  Resp: 19 (!) 24  Temp:    SpO2: 95% 93%    Last Pain:  Vitals:   12/22/23 1232  TempSrc:   PainSc: 4                  Melvenia Stabs

## 2023-12-22 NOTE — Progress Notes (Signed)
 TCTS:   I provided surgical backup in cath lab 6 for ICD lead removal by Dr. Marven Slimmer for 2 hours, 20 minutes.  Bartley Lightning, MD.

## 2023-12-23 ENCOUNTER — Encounter (HOSPITAL_COMMUNITY): Payer: Self-pay | Admitting: Cardiology

## 2023-12-23 ENCOUNTER — Ambulatory Visit (HOSPITAL_COMMUNITY)

## 2023-12-23 DIAGNOSIS — I130 Hypertensive heart and chronic kidney disease with heart failure and stage 1 through stage 4 chronic kidney disease, or unspecified chronic kidney disease: Secondary | ICD-10-CM | POA: Diagnosis not present

## 2023-12-23 DIAGNOSIS — I428 Other cardiomyopathies: Secondary | ICD-10-CM | POA: Diagnosis not present

## 2023-12-23 DIAGNOSIS — T82198A Other mechanical complication of other cardiac electronic device, initial encounter: Secondary | ICD-10-CM

## 2023-12-23 DIAGNOSIS — T82110A Breakdown (mechanical) of cardiac electrode, initial encounter: Secondary | ICD-10-CM | POA: Diagnosis not present

## 2023-12-23 DIAGNOSIS — I5022 Chronic systolic (congestive) heart failure: Secondary | ICD-10-CM | POA: Diagnosis not present

## 2023-12-23 NOTE — TOC CM/SW Note (Signed)
 Transition of Care [facility]) - Inpatient Brief Assessment   Patient Details  Name: Edward Mullen MRN: [id number] Date of Birth: [birth year]  Transition of Care [facility]) CM/SW Contact:    Jennett Model, RN Phone Number: 12/23/2023, 1:02 PM   Clinical Narrative: From home with daughter, has PCP and insurance on file, states has no HH services in place at this time , has walker/cane at home.  States family member will transport them home at [employer] and family is support system, .  Pta self ambulatory with cane.    Transition of Care Asessment: Insurance and Status: Insurance coverage has been reviewed Patient has primary care physician: Yes Home environment has been reviewed: with daughter Prior level of function:: ambulatory with cane Prior/Current Home Services: Current home services (cane and walker) Social Drivers of Health Review: SDOH reviewed no interventions necessary Readmission risk has been reviewed: Yes Transition of care needs: no transition of care needs at this time

## 2023-12-23 NOTE — Discharge Summary (Addendum)
 ELECTROPHYSIOLOGY PROCEDURE DISCHARGE SUMMARY    Patient ID: Edward Mullen,  MRN: [id number], DOB/AGE: 04-19-1956 68 y.o.  Admit date: 12/22/2023 Discharge date: 12/23/2023  Primary Care Physician: Edward Lose, NP  Primary Cardiologist: Dr. Mitzie Mullen Electrophysiologist: Dr. Rodolfo Mullen  Primary Discharge Diagnosis:  RV lead fracture/failure  Secondary Discharge Diagnosis:  HTN DM Chronic systolic CHF NICM COPD CAD CKD (III)  No Known Allergies   Procedures This Admission:  1. RV lead extraction with new RV/ICD lead implant on 12/22/23 by Dr s Edward Mullen.   There were no immediate post procedure complications. 2.  CXR on 12/23/22 reported no pneumothorax status post device implantation.   Brief HPI: Edward Mullen is a 68 y.o. male is followed by our EP team, noted to have developed RV lead failure/fracture.  Recommended RV lead extraction with new lead implant.  Risks, benefits, and alternatives to the procedure were reviewed with the patient who wished to proceed.   Hospital Course:  The patient was admitted and underwent lead extraction anew lead implant with details as outlined in the procedure report. He was monitored on telemetry overnight which demonstrated SR/VP.  Left chest was without hematoma or ecchymosis.  The device was interrogated and found to be functioning normally.  CXR was obtained and demonstrated no pneumothorax status post device implantation.  Wound care, arm mobility, and restrictions were reviewed with the patient.  The patient feels well, denies any CP or SOB, minimal site discomfort, he was examined by Dr. Daneil Mullen and considered stable for discharge to home.   NOTED that during his procedure, CS lead advanced further into the CS Post procedure EKG with wider/less ideal QRS morphology for BiVe pacing LV opt run with stim using 1 and 4 No improvement in morphology with pole 2 Left with pole 3 (unchanged from intra-op) At his wound check  visit will plan to have him see EP APP with industry support to try and EKG optimize if able.  Physical Exam: Vitals:   12/23/23 0900 12/23/23 1000 12/23/23 1100 12/23/23 1143  BP:    133/82  Pulse:    63  Resp: 18 (!) 22 18 (!) 23  Temp:    99 F (37.2 C)  TempSrc:    Oral  SpO2:    97%  Weight:      Height:        GEN- The patient is well appearing, alert and oriented x 3 today.   HEENT: normocephalic, atraumatic; sclera clear, conjunctiva pink; hearing intact; oropharynx clear Lungs- CTA b/l, normal work of breathing.  No wheezes, rales, rhonchi Heart- RRR, no murmurs, rubs or gallops, PMI not laterally displaced GI- soft, non-tender, non-distended Extremities- no clubbing, cyanosis, or edema MS- no significant deformity or atrophy Skin- warm and dry, no rash or lesion, left chest without hematoma/ecchymosis Psych- euthymic mood, full affect Neuro- no gross defecits  Labs:   Lab Results  Component Value Date   WBC 4.2 12/15/2023   HGB 13.5 12/15/2023   HCT 41.5 12/15/2023   MCV 86 12/15/2023   PLT 116 (L) 12/15/2023   No results for input(s): "NA", "K", "CL", "CO2", "BUN", "CREATININE", "CALCIUM ", "PROT", "BILITOT", "ALKPHOS", "ALT", "AST", "GLUCOSE" in the last 168 hours.  Invalid input(s): "LABALBU"  Discharge Medications:  Allergies as of 12/23/2023   No Known Allergies      Medication List     TAKE these medications    albuterol  108 (90 Base) MCG/ACT inhaler Commonly known as: VENTOLIN  HFA  INHALE 1 TO 2 PUFFS BY MOUTH EVERY 6 HOURS AS NEEDED FOR WHEEZING OR SHORTNESS OF BREATH   amiodarone  200 MG tablet Commonly known as: PACERONE  Take 1 tablet (200 mg total) by mouth 2 (two) times daily for 10 days. Then resume 200mg  once daily. What changed:  when to take this additional instructions Notes to patient: Continue to take 200mg  (1 tablet) once daily.   Aspirin  Low Dose 81 MG tablet Generic drug: aspirin  EC TAKE 1 TABLET (81 MG TOTAL) BY MOUTH  DAILY. SWALLOW WHOLE(AM)   carvedilol  3.125 MG tablet Commonly known as: Coreg  Take 1 tablet (3.125 mg total) by mouth 2 (two) times daily.   gabapentin  600 MG tablet Commonly known as: NEURONTIN  Take 600 mg by mouth 2 (two) times daily.   Jardiance  10 MG Tabs tablet Generic drug: empagliflozin  Take 10 mg by mouth daily.   losartan  25 MG tablet Commonly known as: COZAAR  Take 25 mg by mouth daily.   mexiletine 150 MG capsule Commonly known as: MEXITIL  TAKE 1 CAPSULE BY MOUTH 2 (TWO) TIMES DAILY (AM+BEDTIME)   nortriptyline  25 MG capsule Commonly known as: PAMELOR  Take 25 mg by mouth daily.   rosuvastatin  20 MG tablet Commonly known as: CRESTOR  Take 1 tablet (20 mg total) by mouth daily.   Stiolto Respimat 2.5-2.5 MCG/ACT Aers Generic drug: Tiotropium Bromide-Olodaterol Inhale 2 puffs into the lungs daily.   tamsulosin  0.4 MG Caps capsule Commonly known as: FLOMAX  Take 0.4 mg by mouth at bedtime.   torsemide  20 MG tablet Commonly known as: DEMADEX  TAKE 1 TABLET (20 MG TOTAL) BY MOUTH DAILY (AM)   Vitamin D3 50 MCG (2000 UT) Tabs Take 2,000 Units by mouth daily.        Disposition:  Discharge Instructions     Diet - low sodium heart healthy   Complete by: As directed    Increase activity slowly   Complete by: As directed        Follow-up Information     Edward Lose, NP. Go on 01/06/2024.   Specialty: Nurse Practitioner Why: 1:40pm with pick up between 1-1:20pm Contact information: [address] [place] [address] [phone]                 Signed, Edward Abrahams, PA-C 12/23/2023 12:14 PM   I have seen, examined the patient, and reviewed the above assessment and plan.    Hospital Course: Edward Mullen is a 68 year old male with a past medical history notable for RV ICD lead malfunction who presented on 12/22/2023 for extraction and reimplantation.  Patient underwent uncomplicated RV lead extraction with reimplantation of new RV ICD  lead.  He was monitored overnight with no acute events.  On postoperative day 1, patient reported feeling relatively well.  He had chest x-ray with stable lead position and no pneumothorax.  Bedside device interrogation was performed with appropriate device function and stable lead parameters.  He was discharged with close follow-up scheduled in our device clinic.  General: Well developed, in no acute distress.  Neck: No JVD.  Cardiac: Normal rate, regular rhythm.  Left chest pacer pocket without hematoma or bleeding. Resp: Normal work of breathing.  Groin: Right groin site without bleeding or hematoma. Ext: No edema.  Neuro: No gross focal deficits.  Psych: Normal affect.   Plan:  - Chest x-ray with stable lead position and no pneumothorax. -Bedside device interrogation performed with appropriate device function and stable lead parameters.  There was some movement in coronary sinus lead, likely  now deeper within branch, stable capture threshold but utilizing different electrode.  Paced morphology different from prior to extraction.  Bedside optimization was performed.  Will benefit from repeat EKG guided optimization at his subsequent device visit. -Post implant instructions were provided regarding activity restrictions and wound care.  Duration of Discharge Encounter: 45 minutes  Ardeen Kohler, MD 12/23/2023 10:04 PM

## 2023-12-23 NOTE — TOC Transition Note (Signed)
 Transition of Care [facility]) - Discharge Note   Patient Details  Name: Edward Mullen MRN: [id number] Date of Birth: 01-07-1956  Transition of Care [facility]) CM/SW Contact:  Jennett Model, RN Phone Number: 12/23/2023, 1:03 PM   Clinical Narrative:    For dc home. Grand daughter to transport home. No needs.         Patient Goals and CMS Choice            Discharge Placement                       Discharge Plan and Services Additional resources added to the After Visit Summary for                                       Social Drivers of Health (SDOH) Interventions SDOH Screenings   Food Insecurity: No Food Insecurity (12/22/2023)  Housing: Low Risk  (12/22/2023)  Transportation Needs: Unmet Transportation Needs (12/22/2023)  Utilities: Not At Risk (12/22/2023)  Alcohol Screen: Low Risk  (06/17/2022)  Depression (PHQ2-9): Low Risk  (12/04/2022)  Financial Resource Strain: Low Risk  (06/17/2022)  Physical Activity: Inactive (06/17/2022)  Social Connections: Unknown (12/22/2023)  Stress: No Stress Concern Present (06/17/2022)  Tobacco Use: High Risk (12/22/2023)     Readmission Risk Interventions    03/12/2023   12:00 PM  Readmission Risk Prevention Plan  Transportation Screening Complete  PCP or Specialist Appt within 3-5 Days Complete  HRI or Home Care Consult Complete  Palliative Care Screening Not Applicable  Medication Review (RN Care Manager) Complete

## 2023-12-23 NOTE — Progress Notes (Addendum)
 Discharge instructions reviewed with pt, reviewed education on pacer/defib incision site, mobility restrictions and follow up appointments, pt verbalized understanding.  Copy of instructions given to pt. No changes in medications, no new scripts.  Pt will be d/c'd via wheelchair with belongings and will be escorted by staff to the discharge lounge. (Pt has called his granddaughter to pick him up and she is aware to go to the discharge lounge exit).   Fleta Human, RN SWOT

## 2023-12-23 NOTE — Plan of Care (Signed)

## 2023-12-23 NOTE — Discharge Instructions (Addendum)
 After Your Lead Revision  Do not lift your arm above shoulder height for 1 week after your procedure. After 7 days, you may progress as below.  You should remove your sling 24 hours after your procedure, unless otherwise instructed by your provider.     Tuesday Dec 30, 2023  Wednesday Dec 31, 2023 Thursday Jan 01, 2024 Friday Jan 02, 2024   Do not lift, push, pull, or carry anything over 10 pounds with the affected arm until 6 weeks (Tuesday February 03, 2024) after your procedure.   Do not drive until your wound check or until instructed by your healthcare provider that you are safe to do so.   Monitor your surgical site for redness, swelling, and drainage. Call the device clinic at [phone] if you experience these symptoms or fever/chills.  If your incision is sealed with Steri-strips or staples. You may shower 7 days after your procedure and wash your incision with soap and water as long as it is healed. If your incision is closed with Dermabond/Surgical glue. You may shower 1 day after your pacemaker implant and wash your incision with soap and water. Avoid lotions, ointments, or perfumes over your incision until it is well-healed.  You may use a hot tub or a pool AFTER your wound check appointment if the incision is completely closed.   Your cardiac device may be MRI compatible. We will discuss this at your office visit/Wound check  Remote monitoring is used to monitor your cardiac device from home. This monitoring is scheduled every 91 days by our office. It allows us  to keep an eye on the functioning of your device to ensure it is working properly. You will routinely see your Electrophysiologist annually (more often if necessary).   Implantable Cardiac Device Lead Replacement, Care After This sheet gives you information about how to care for yourself after your procedure. Your health care provider may also give you more specific instructions. If you have problems or questions, contact  your health care provider. What can I expect after the procedure? After your procedure, it is common to have: Mild discomfort at the incision site. A small amount of drainage or bleeding at the incision site. This is usually no more than a spot. Follow these instructions at home: Incision care        Follow instructions from your health care provider about how to take care of your incision. Make sure you: Leave stitches (sutures), skin glue, or adhesive strips in place. These skin closures may need to stay in place for 2 weeks or longer. If adhesive strip edges start to loosen and curl up, you may trim the loose edges. Do not remove adhesive strips completely unless your health care provider tells you to do that. Check your incision area every day for signs of infection. Check for: More redness, swelling, or pain. More fluid or blood. Warmth. Pus or a bad smell. Electric and Engineer, building services cell phones should be kept 12 inches (30 cm) away from the cardiac device when they are on. When talking on a cell phone, use the ear on the opposite side of your cardiac device. Do not place a cell phone in a pocket next to the cardiac device. Household appliances do not interfere with modern-day cardiac device. Medicines Take over-the-counter and prescription medicines only as told by your health care provider. General instructions Do not raise the arm on the side of your procedure higher than your shoulder for at least 7 days. Except  for this restriction, continue to use your arm as normal to prevent problems. Do not take baths, swim, or use a hot tub until your health care provider says it is okay to do so. You may shower as directed by your health care provider. Do not lift anything that is heavier than 10 lb (4.5 kg) for 6 weeks or the limit that your health care provider tells you, until he or she says that it is safe. Return to your normal activities after 2 weeks, or as told by your  health care provider. Ask your health care provider what activities are safe for you. Keep all follow-up visits as told by your health care provider. This is important. Contact a health care provider if: You have more redness, swelling, or pain around your incision. You have more fluid or blood coming from your incision. Your incision feels warm to the touch. You have pus or a bad smell coming from your incision. You have a fever. The arm or hand on the side of the cardiac device becomes swollen. The symptoms you had before your procedure are not getting better. Get help right away if: You develop chest pain. You feel like you will faint. You feel light-headed. You faint. Summary Check your incision area every day for signs of infection, such as more fluid or blood. A small amount of drainage or bleeding at the incision site is normal. Do not raise the arm on the side of your procedure higher than your shoulder for at least 5 days, or as long as directed by your health care provider. Digital cell phones should be kept 12 inches (30 cm) away from the cardiac device when they are on. When talking on a cell phone, use the ear on the opposite side of your cardiac device. If the symptoms that led to having your lead replaced are not getting better, contact your health care provider. This information is not intended to replace advice given to you by your health care provider. Make sure you discuss any questions you have with your health care provider.   Groin site care instructions  This sheet gives you information about how to care for yourself after your procedure. Your health care provider may also give you more specific instructions. If you have problems or questions, contact your health care provider. What can I expect after the procedure? After the procedure, it is common to have: Bruising around your puncture site. Tenderness around your puncture site. Skipped heartbeats. If you had an  atrial fibrillation ablation, you may have atrial fibrillation during the first several months after your procedure.  Tiredness (fatigue).  Follow these instructions at home: Puncture site care  Follow instructions from your health care provider about how to take care of your puncture site. Make sure you: If present, leave stitches (sutures), skin glue, or adhesive strips in place. These skin closures may need to stay in place for up to 2 weeks. If adhesive strip edges start to loosen and curl up, you may trim the loose edges. Do not remove adhesive strips completely unless your health care provider tells you to do that. If a large square bandage is present, this may be removed 24 hours after surgery.  Check your puncture site every day for signs of infection. Check for: Redness, swelling, or pain. Fluid or blood. If your puncture site starts to bleed, lie down on your back, apply firm pressure to the area, and contact your health care provider. Warmth. Pus or  a bad smell. A pea or marble sized lump/knot at the site is normal and can take up to three months to resolve.   Activity (ALSO see restrictions ABOVE > please be aware of both as we discussed) Avoid activities that take a lot of effort for at least 7 days after your procedure. Do not lift anything that is heavier than 5 lb (4.5 kg) for one week.  No sexual activity for 1 week.  Return to your normal activities as told by your health care provider. Ask your health care provider what activities are safe for you. General instructions Take over-the-counter and prescription medicines only as told by your health care provider. Do not use any products that contain nicotine  or tobacco, such as cigarettes and e-cigarettes. If you need help quitting, ask your health care provider. You may gently wash the area after 24 hours, pat dry Do not drink alcohol for 24 hours after your procedure. Keep all follow-up visits as told by your health care  provider. This is important. Contact a health care provider if: You have redness, mild swelling, or pain around your puncture site. You have fluid or blood coming from your puncture site that stops after applying firm pressure to the area. Your puncture site feels warm to the touch. You have pus or a bad smell coming from your puncture site. You have a fever. You have chest pain or discomfort that spreads to your neck, jaw, or arm. You have chest pain that is worse with lying on your back or taking a deep breath. You are sweating a lot. You feel nauseous. You have a fast or irregular heartbeat. You have shortness of breath. You are dizzy or light-headed and feel the need to lie down. You have pain or numbness in the arm or leg closest to your puncture site. Get help right away if: Your puncture site suddenly swells. Your puncture site is bleeding and the bleeding does not stop after applying firm pressure to the area. These symptoms may represent a serious problem that is an emergency. Do not wait to see if the symptoms will go away. Get medical help right away. Call your local emergency services (911 in the [place] Do not drive yourself to the hospital. Summary After the procedure, it is normal to have bruising and tenderness at the puncture site in your groin, neck, or forearm. Check your puncture site every day for signs of infection. Get help right away if your puncture site is bleeding and the bleeding does not stop after applying firm pressure to the area. This is a medical emergency. This information is not intended to replace advice given to you by your health care provider. Make sure you discuss any questions you have with your health care provider.

## 2023-12-24 ENCOUNTER — Encounter: Payer: Self-pay | Admitting: Emergency Medicine

## 2023-12-24 LAB — BPAM RBC
Blood Product Expiration Date: 202506042359
Blood Product Expiration Date: 202506042359
ISSUE DATE / TIME: 202505120819
ISSUE DATE / TIME: 202505120819
Unit Type and Rh: 5100
Unit Type and Rh: 5100

## 2023-12-24 LAB — TYPE AND SCREEN
ABO/RH(D): O POS
Antibody Screen: NEGATIVE
Unit division: 0
Unit division: 0

## 2023-12-24 MED FILL — Fentanyl Citrate Preservative Free (PF) Inj 100 MCG/2ML: INTRAMUSCULAR | Qty: 2 | Status: AC

## 2023-12-28 ENCOUNTER — Emergency Department (HOSPITAL_COMMUNITY)

## 2023-12-28 ENCOUNTER — Other Ambulatory Visit: Payer: Self-pay

## 2023-12-28 ENCOUNTER — Observation Stay (HOSPITAL_COMMUNITY)
Admission: EM | Admit: 2023-12-28 | Discharge: 2023-12-30 | Disposition: A | Discharge status: Home / Self Care | Attending: Internal Medicine | Admitting: Internal Medicine

## 2023-12-28 DIAGNOSIS — Z79899 Other long term (current) drug therapy: Secondary | ICD-10-CM | POA: Insufficient documentation

## 2023-12-28 DIAGNOSIS — I130 Hypertensive heart and chronic kidney disease with heart failure and stage 1 through stage 4 chronic kidney disease, or unspecified chronic kidney disease: Secondary | ICD-10-CM | POA: Insufficient documentation

## 2023-12-28 DIAGNOSIS — R2689 Other abnormalities of gait and mobility: Secondary | ICD-10-CM | POA: Insufficient documentation

## 2023-12-28 DIAGNOSIS — N400 Enlarged prostate without lower urinary tract symptoms: Secondary | ICD-10-CM | POA: Insufficient documentation

## 2023-12-28 DIAGNOSIS — D696 Thrombocytopenia, unspecified: Secondary | ICD-10-CM | POA: Diagnosis not present

## 2023-12-28 DIAGNOSIS — I5020 Unspecified systolic (congestive) heart failure: Secondary | ICD-10-CM | POA: Diagnosis not present

## 2023-12-28 DIAGNOSIS — N1831 Chronic kidney disease, stage 3a: Secondary | ICD-10-CM | POA: Diagnosis present

## 2023-12-28 DIAGNOSIS — R55 Syncope and collapse: Principal | ICD-10-CM | POA: Diagnosis present

## 2023-12-28 DIAGNOSIS — N179 Acute kidney failure, unspecified: Secondary | ICD-10-CM | POA: Diagnosis not present

## 2023-12-28 DIAGNOSIS — F17220 Nicotine dependence, chewing tobacco, uncomplicated: Secondary | ICD-10-CM | POA: Insufficient documentation

## 2023-12-28 DIAGNOSIS — F1910 Other psychoactive substance abuse, uncomplicated: Secondary | ICD-10-CM | POA: Diagnosis not present

## 2023-12-28 DIAGNOSIS — E1122 Type 2 diabetes mellitus with diabetic chronic kidney disease: Secondary | ICD-10-CM | POA: Insufficient documentation

## 2023-12-28 DIAGNOSIS — Z7982 Long term (current) use of aspirin: Secondary | ICD-10-CM | POA: Diagnosis not present

## 2023-12-28 LAB — CBC
HCT: 37.8 % — ABNORMAL LOW (ref 39.0–52.0)
Hemoglobin: 11.9 g/dL — ABNORMAL LOW (ref 13.0–17.0)
MCH: 27.9 pg (ref 26.0–34.0)
MCHC: 31.5 g/dL (ref 30.0–36.0)
MCV: 88.7 fL (ref 80.0–100.0)
Platelets: 83 10*3/uL — ABNORMAL LOW (ref 150–400)
RBC: 4.26 MIL/uL (ref 4.22–5.81)
RDW: 14.6 % (ref 11.5–15.5)
WBC: 4.7 10*3/uL (ref 4.0–10.5)
nRBC: 0 % (ref 0.0–0.2)

## 2023-12-28 LAB — RAPID URINE DRUG SCREEN, HOSP PERFORMED
Amphetamines: NOT DETECTED
Barbiturates: NOT DETECTED
Benzodiazepines: NOT DETECTED
Cocaine: NOT DETECTED
Opiates: NOT DETECTED
Tetrahydrocannabinol: POSITIVE — AB

## 2023-12-28 LAB — I-STAT CHEM 8, ED
BUN: 31 mg/dL — ABNORMAL HIGH (ref 8–23)
Calcium, Ion: 1.21 mmol/L (ref 1.15–1.40)
Chloride: 102 mmol/L (ref 98–111)
Creatinine, Ser: 2.2 mg/dL — ABNORMAL HIGH (ref 0.61–1.24)
Glucose, Bld: 111 mg/dL — ABNORMAL HIGH (ref 70–99)
HCT: 36 % — ABNORMAL LOW (ref 39.0–52.0)
Hemoglobin: 12.2 g/dL — ABNORMAL LOW (ref 13.0–17.0)
Potassium: 3.5 mmol/L (ref 3.5–5.1)
Sodium: 138 mmol/L (ref 135–145)
TCO2: 22 mmol/L (ref 22–32)

## 2023-12-28 LAB — TROPONIN I (HIGH SENSITIVITY)
Troponin I (High Sensitivity): 37 ng/L — ABNORMAL HIGH (ref <18)
Troponin I (High Sensitivity): 36 ng/L — ABNORMAL HIGH (ref <18)

## 2023-12-28 LAB — URINALYSIS, ROUTINE W REFLEX MICROSCOPIC
Bilirubin Urine: NEGATIVE
Glucose, UA: 150 mg/dL — AB
Hgb urine dipstick: NEGATIVE
Ketones, ur: NEGATIVE mg/dL
Leukocytes,Ua: NEGATIVE
Nitrite: NEGATIVE
Protein, ur: NEGATIVE mg/dL
Specific Gravity, Urine: 1.01 (ref 1.005–1.030)
pH: 5 (ref 5.0–8.0)

## 2023-12-28 LAB — BASIC METABOLIC PANEL WITH GFR
Anion gap: 12 (ref 5–15)
BUN: 31 mg/dL — ABNORMAL HIGH (ref 8–23)
CO2: 21 mmol/L — ABNORMAL LOW (ref 22–32)
Calcium: 9 mg/dL (ref 8.9–10.3)
Chloride: 104 mmol/L (ref 98–111)
Creatinine, Ser: 2.21 mg/dL — ABNORMAL HIGH (ref 0.61–1.24)
GFR, Estimated: 31 mL/min — ABNORMAL LOW (ref >60)
Glucose, Bld: 111 mg/dL — ABNORMAL HIGH (ref 70–99)
Potassium: 3.5 mmol/L (ref 3.5–5.1)
Sodium: 137 mmol/L (ref 135–145)

## 2023-12-28 LAB — BRAIN NATRIURETIC PEPTIDE: B Natriuretic Peptide: 104.4 pg/mL — ABNORMAL HIGH (ref 0.0–100.0)

## 2023-12-28 LAB — ETHANOL: Alcohol, Ethyl (B): <15 mg/dL (ref <15)

## 2023-12-28 LAB — I-STAT CG4 LACTIC ACID, ED
Lactic Acid, Venous: <0.3 mmol/L — ABNORMAL LOW (ref 0.5–1.9)
Lactic Acid, Venous: 1 mmol/L (ref 0.5–1.9)
Lactic Acid, Venous: 0.5 mmol/L (ref 0.5–1.9)

## 2023-12-28 LAB — CK: Total CK: 112 U/L (ref 49–397)

## 2023-12-28 MED ORDER — CALCIUM GLUCONATE-NACL 1-0.675 GM/50ML-% IV SOLN
1.0000 g | Freq: Once | INTRAVENOUS | Status: AC
  Administered 2023-12-28: 1000 mg via INTRAVENOUS
  Filled 2023-12-28: qty 50

## 2023-12-28 MED ORDER — ENOXAPARIN SODIUM 40 MG/0.4ML IJ SOSY
40.0000 mg | PREFILLED_SYRINGE | Freq: Every day | INTRAMUSCULAR | Status: DC
  Administered 2023-12-29 – 2023-12-30 (×2): 40 mg via SUBCUTANEOUS
  Filled 2023-12-28 (×2): qty 0.4

## 2023-12-28 MED ORDER — SODIUM CHLORIDE 0.9 % IV BOLUS
500.0000 mL | Freq: Once | INTRAVENOUS | Status: AC
  Administered 2023-12-28: 500 mL via INTRAVENOUS

## 2023-12-28 MED ORDER — MIDODRINE HCL 5 MG PO TABS
5.0000 mg | ORAL_TABLET | Freq: Three times a day (TID) | ORAL | Status: DC
  Administered 2023-12-28: 5 mg via ORAL
  Filled 2023-12-28: qty 1

## 2023-12-28 NOTE — ED Provider Notes (Signed)
 [facility] Provider Note   CSN: [id number] Arrival date & time: 12/28/23  1548     History  Chief Complaint  Patient presents with   Loss of Consciousness    THORNTON DOHRMANN is a 68 y.o. 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  EMS arrived and patient was also unresponsive, he endorsed being dizzy, denied any chest pain or shortness of breath.  Blood pressure improved after 500 normal saline bolus, normal CBG, 119.  Denies any numbness, tingling, vision changes, he is responsive to all questions at time of arrival, reports that he did smoke a blunt earlier today.  HPI     Home Medications Prior to Admission medications   Medication Sig Start Date End Date Taking? Authorizing Provider  albuterol  (VENTOLIN  HFA) 108 (90 Base) MCG/ACT inhaler INHALE 1 TO 2 PUFFS BY MOUTH EVERY 6 HOURS AS NEEDED FOR WHEEZING OR SHORTNESS OF BREATH 10/29/22   Cathey Clunes, MD  amiodarone  (PACERONE ) 200 MG tablet Take 1 tablet (200 mg total) by mouth 2 (two) times daily for 10 days. Then resume 200mg  once daily. Patient taking differently: Take 200 mg by mouth daily. 10/02/23 10/26/24  Daren Eck, DO  ASPIRIN  LOW DOSE 81 MG tablet TAKE 1 TABLET (81 MG TOTAL) BY MOUTH DAILY. SWALLOW WHOLE(AM) 01/27/23   Mil[place], Arlice Bene, FNP  carvedilol  (COREG ) 3.125 MG tablet Take 1 tablet (3.125 mg total) by mouth 2 (two) times daily. 01/29/22 03/25/24  Elmarie Hacking, FNP  Cholecalciferol (VITAMIN D3) 50 MCG (2000 UT) TABS Take 2,000 Units by mouth daily.    [provider]  gabapentin   (NEURONTIN ) 600 MG tablet Take 600 mg by mouth 2 (two) times daily.    [provider]  JARDIANCE  10 MG TABS tablet Take 10 mg by mouth daily. 04/03/23   [provider]  losartan  (COZAAR ) 25 MG tablet Take 25 mg by mouth daily.    [provider]  mexiletine (MEXITIL ) 150 MG capsule TAKE 1 CAPSULE BY MOUTH 2 (TWO) TIMES DAILY (AM+BEDTIME) Patient not taking: Reported on 12/18/2023 11/05/23   Darlis Eisenmenger, MD  nortriptyline  (PAMELOR ) 25 MG capsule Take 25 mg by mouth daily. 12/08/23   [provider]  rosuvastatin  (CRESTOR ) 20 MG tablet Take 1 tablet (20 mg total) by mouth daily. 07/25/23   Darlis Eisenmenger, MD  STIOLTO RESPIMAT 2.5-2.5 MCG/ACT AERS Inhale 2 puffs into the lungs daily. 06/11/23   [provider]  tamsulosin  (FLOMAX ) 0.4 MG CAPS capsule Take 0.4 mg by mouth at bedtime.    [provider]  torsemide  (DEMADEX ) 20 MG tablet TAKE 1 TABLET (20 MG TOTAL) BY MOUTH DAILY (AM) 12/09/23   Darlis Eisenmenger, MD  zolpidem  (AMBIEN ) 10 MG tablet Take 1 tablet (10 mg total) by mouth at bedtime as needed for sleep. 09/16/22 01/03/23        Allergies    Patient has no known allergies.    Review of Systems   Review of Systems  All other systems reviewed and are negative.   Physical Exam Updated Vital Signs BP 101/68   Pulse (!) 59   Temp 98.4 F (36.9 C) (Oral)  Resp 17   Ht 6' (1.829 m)   Wt 86.2 kg   SpO2 100%   BMI 25.77 kg/m  Physical Exam Vitals and nursing note reviewed.  Constitutional:      General: He is not in acute distress.    Appearance: Normal appearance. He is obese. He is ill-appearing.  HENT:     Head: Normocephalic and atraumatic.  Eyes:     General:        Right eye: No discharge.        Left eye: No discharge.  Cardiovascular:     Rate and Rhythm: Normal rate and regular rhythm.     Heart sounds: No murmur heard.    No friction rub. No gallop.  Pulmonary:     Effort: Pulmonary effort is normal.      Breath sounds: Normal breath sounds.  Abdominal:     General: Bowel sounds are normal.     Palpations: Abdomen is soft.     Comments: Somewhat distended but no fluid wave, no tenderness throughout, no rigidity, no guarding.  Skin:    General: Skin is warm and dry.     Capillary Refill: Capillary refill takes less than 2 seconds.  Neurological:     Mental Status: He is alert and oriented to person, place, and time.  Psychiatric:        Mood and Affect: Mood normal.        Behavior: Behavior normal.     ED Results / Procedures / Treatments   Labs (all labs ordered are listed, but only abnormal results are displayed) Labs Reviewed  URINALYSIS, ROUTINE W REFLEX MICROSCOPIC - Abnormal; Notable for the following components:      Result Value   Glucose, UA 150 (*)    All other components within normal limits  BASIC METABOLIC PANEL WITH GFR - Abnormal; Notable for the following components:   CO2 21 (*)    Glucose, Bld 111 (*)    BUN 31 (*)    Creatinine, Ser 2.21 (*)    GFR, Estimated 31 (*)    All other components within normal limits  BRAIN NATRIURETIC PEPTIDE - Abnormal; Notable for the following components:   B Natriuretic Peptide 104.4 (*)    All other components within normal limits  CBC - Abnormal; Notable for the following components:   Hemoglobin 11.9 (*)    HCT 37.8 (*)    Platelets 83 (*)    All other components within normal limits  RAPID URINE DRUG SCREEN, HOSP PERFORMED - Abnormal; Notable for the following components:   Tetrahydrocannabinol POSITIVE (*)    All other components within normal limits  I-STAT CG4 LACTIC ACID, ED - Abnormal; Notable for the following components:   Lactic Acid, Venous <0.3 (*)    All other components within normal limits  I-STAT CHEM 8, ED - Abnormal; Notable for the following components:   BUN 31 (*)    Creatinine, Ser 2.20 (*)    Glucose, Bld 111 (*)    Hemoglobin 12.2 (*)    HCT 36.0 (*)    All other components within normal  limits  TROPONIN I (HIGH SENSITIVITY) - Abnormal; Notable for the following components:   Troponin I (High Sensitivity) 37 (*)    All other components within normal limits  CK  ETHANOL  I-STAT CG4 LACTIC ACID, ED  I-STAT CHEM 8, ED  I-STAT CHEM 8, ED  I-STAT CG4 LACTIC ACID, ED  TROPONIN I (HIGH SENSITIVITY)  EKG None  Radiology CT Head Wo Contrast Result Date: 12/28/2023 CLINICAL DATA:  Syncope/presyncope. EXAM: CT HEAD WITHOUT CONTRAST TECHNIQUE: Contiguous axial images were obtained from the base of the skull through the vertex without intravenous contrast. RADIATION DOSE REDUCTION: This exam was performed according to the departmental dose-optimization program which includes automated exposure control, adjustment of the mA and/or kV according to patient size and/or use of iterative reconstruction technique. COMPARISON:  Head CT 03/10/2023 FINDINGS: Brain: No evidence of acute infarction, hemorrhage, hydrocephalus, extra-axial collection or mass lesion/mass effect. There is mild periventricular white matter hypodensity. 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 Vascular: Atherosclerotic calcifications are present within the cavernous internal carotid arteries. Skull: Normal. Negative for fracture or focal lesion. Sinuses/Orbits: No acute finding. There is a small polyp or mucous retention cyst in the left maxillary sinus. Other: None. IMPRESSION: 1. No acute intracranial process. 2. Mild chronic small vessel ischemic changes. Electronically Signed   By: Tyron Gallon M.D.   On: 12/28/2023 18:40   DG Chest Portable 1 View Result Date: 12/28/2023 CLINICAL DATA:  Syncope EXAM: PORTABLE CHEST 1 VIEW COMPARISON:  Chest x-ray 12/23/2023 FINDINGS: The heart is enlarged. The lungs are clear. There is no pleural effusion or pneumothorax. Left-sided ICD is present. No acute fractures are seen. IMPRESSION: Cardiomegaly. No acute cardiopulmonary process. Electronically Signed   By:  Tyron Gallon M.D.   On: 12/28/2023 17:04    Procedures .Critical Care  Performed by: Nelly Banco, PA-C Authorized by: Nelly Banco, PA-C   Critical care provider statement:    Critical care time (minutes):  35   Critical care was necessary to treat or prevent imminent or life-threatening deterioration of the following conditions:  Shock   Critical care was time spent personally by me on the following activities:  Development of treatment plan with patient or surrogate, discussions with consultants, evaluation of patient's response to treatment, examination of patient, ordering and review of laboratory studies, ordering and review of radiographic studies, ordering and performing treatments and interventions, pulse oximetry, re-evaluation of patient's condition and review of old charts   Care discussed with: admitting provider       Medications Ordered in ED Medications  midodrine  (PROAMATINE ) tablet 5 mg (5 mg Oral Given 12/28/23 1650)  sodium chloride  0.9 % bolus 500 mL (0 mLs Intravenous Stopped 12/28/23 1655)  calcium  gluconate 1 g/ 50 mL sodium chloride  IVPB (0 mg Intravenous Stopped 12/28/23 1803)  sodium chloride  0.9 % bolus 500 mL (0 mLs Intravenous Stopped 12/28/23 1853)    ED Course/ Medical Decision Making/ A&P                                 Medical Decision Making Amount and/or Complexity of Data Reviewed Labs: ordered. Radiology: ordered.  Risk Prescription drug management.   This patient is a 67 y.o. male who presents to the ED for concern of shock, syncope, this involves an extensive number of treatment options, and is a complaint that carries with it a high risk of complications and morbidity. The emergent differential diagnosis prior to evaluation includes, but is not limited to,  CVA, ACS, arrhythmia, vasovagal / orthostatic hypotension, sepsis, hypoglycemia, electrolyte disturbance, respiratory failure, anemia, dehydration, heat injury,  polypharmacy, malignancy, anxiety/panic attack . This is not an exhaustive differential.   Past Medical History / Co-morbidities / Social History: Hypertension, polysubstance abuse, tobacco abuse, heart failure, less  than 20% ejection fraction, 8 CKD, COPD, pacemaker in place, recent pacemaker replacement within the last 3 to 4 days.  Additional history: Chart reviewed. Pertinent results include: Reviewed lab work, imaging from previous emergency department visits, hospitalizations, recent echoes and outpatient cardiology evaluation.  Physical Exam: Physical exam performed. The pertinent findings include: Patient ill-appearing, has been hypotensive throughout his ED stay, systolic has been hovering around low 82 high 70s with a few mean arterial pressure less than 65.  But he has remained responsive, neurologically intact on exam.  Lab Tests: I ordered, and personally interpreted labs.  The pertinent results include: Initial i-STAT with elevated creatinine, BUN, 2.2, 31 respectively but not significantly changed from his baseline, hemoglobin 12.2, his formal lab work is in process.  Lactic acid 1.0.   Imaging Studies: I ordered imaging studies including CT head without contrast with chronic microvascular changes, no acute intracranial abnormality. I independently visualized and interpreted imaging which showed cardiomegaly but no acute intracranial process.. I agree with the radiologist interpretation.   Cardiac Monitoring:  The patient was maintained on a cardiac monitor.  My attending physician Dr. Florie Husband viewed and interpreted the cardiac monitored which showed an underlying rhythm of: Paced ventricular rhythm. I agree with this interpretation.   Medications: I ordered medication including fluid bolus, calcium  gluconate, midodrine  for hypotension, shock on arrival   Consultations Obtained: I requested consultation with the internal medicine team and discussed lab and imaging findings as  well as pertinent plan - they recommend: shock, hypovolemia, syncope in context of high risk patient with recent pacemaker replacement   Disposition: After consideration of the diagnostic results and the patients response to treatment, I feel that patient would benefit from admission as discussed above .   I discussed this case with my attending physician Dr. Florie Husband who cosigned this note including patient's presenting symptoms, physical exam, and planned diagnostics and interventions. Attending physician stated agreement with plan or made changes to plan which were implemented.    Final Clinical Impression(s) / ED Diagnoses Final diagnoses:  None    Rx / DC Orders ED Discharge Orders     None         Stefan Edge 12/28/23 2126    Afton Horse T, DO 12/29/23 2246

## 2023-12-28 NOTE — ED Notes (Signed)
 Holly from [place] was transferred to on duty nurse to discuss [employer] report.

## 2023-12-28 NOTE — H&P (Incomplete)
     Date: 12/28/2023               Patient Name:  Edward Mullen MRN: [id number]  DOB: [birth year] Age / Sex: 68 y.o., male   PCP: Hershell Lose, NP         Medical Service: Internal Medicine Teaching Service         Attending Physician: Dr. Broadus Canes, Whitney Hams, MD    First Contact: Dr. Sheree Dieter, MD Pager: [phone]  Second Contact: Dr. Cathey Clunes, MD Pager: [phone]       After Hours (After 5p/  First Contact Pager: [phone]  weekends / holidays): Second Contact Pager: [phone]   Chief Complaint: ***  History of Present Illness: ***  Meds:  Monthly bubble pack from  Allergies: Allergies as of 12/28/2023  . (No Known Allergies)   Past Medical History:  Diagnosis Date  . Arthritis    "feels like it in my legs" (09/20/2014)  . CAD (coronary artery disease)   . Chronic kidney disease, stage 3a (HCC)   . Chronic systolic CHF (congestive heart failure) (HCC)   . Cocaine  abuse (HCC)   . Diabetes mellitus without complication (HCC) 10/05/2021   type 2  . Heart murmur   . Hypertension   . NICM (nonischemic cardiomyopathy) (HCC)   . NSVT (nonsustained ventricular tachycardia) (HCC) 09/06/2016  . pre diabetes    patient denies    Family History: ***  Social History: ***  Review of Systems: A complete ROS was negative except as per HPI. ***  Physical Exam: Blood pressure 93/68, pulse (!) 59, temperature 98.4 F (36.9 C), temperature source Oral, resp. rate 15, height 6' (1.829 m), weight 86.2 kg, SpO2 100%. ***  EKG: personally reviewed my interpretation is***  CXR: personally reviewed my interpretation is***  Assessment & Plan by Problem: Principal Problem:   Syncope   Dispo: Admit patient to {STATUS:3044014::"Observation with expected length of stay less than 2 midnights.","Inpatient with expected length of stay greater than 2 midnights."}  Signed: Arellano Zameza, Priscila, MD 12/28/2023, 11:08 PM  Pager: @MYPAGER @ After 5pm on  weekdays and 1pm on weekends: On Call pager: [phone]

## 2023-12-28 NOTE — H&P (Addendum)
 Date: 12/28/2023               Patient Name:  Edward Mullen MRN: [id number]  DOB: [birth year] Age / Sex: 68 y.o., male   PCP: Edward Lose, NP         Medical Service: Internal Medicine Teaching Service         Attending Physician: Dr. Broadus Canes, Edward Hams, MD    First Contact: Dr. Sheree Dieter, MD Pager: [phone]  Second Contact: Dr. Cleven Dallas, MD Pager: [phone]       After Hours (After 5p/  First Contact Pager: [phone]  weekends / holidays): Second Contact Pager: [phone]   Chief Complaint: Syncope  History of Present Illness:  [name] is a 68 year old male with PMH of HFrEF (EF <20% 7/24) with ICD in place, out of hospital arrest 4/24 due to sustained ventricular tachycardia, CKD stage 3a, who presents with syncope. He was sitting on the porch in the early afternoon with this grandchild and daughter. He stood up and when he was sitting back down felt dizzy. His daughter started calling his name and he could hear her but was not able to respond.   Family called EMS. The EMS sheet states that he was found unresponsive  with head slumped on his chest in chair moaning. Initial blood pressure was 84/53 with improvement with fluids. He does remember taking his medications this morning. He ate a large breakfast and was drinking fluids throughout the days as normal. He smoked marijuana earlier in the day. Denies recent vomiting or diarrhea. He does not recall having heart palpitations during episode or any chest pain.  He underwent ICD system revision with RV lead extraction and reinsertion 5/12 as EP team received an alert 12/04/23 for right ventricular lead dysfunction. He was observed overnight and device interrogated 5/13 then discharged with plans for follow-up 6/2 for wound check and to re interrogate device.  On 5/7, note from Dr. Marven Mullen with EP mentions holding losartan  and spironolactone , presumably for surgery. Patient states that he held aspirin  in the days  leading up to surgery and had resumed it. He did not hold blood pressure medications.   In the ED, he remained hypotensive with systolic in 80s. This improved with additional 500cc fluid. Lactic acid 1.0.  Device was interrogated in the ED and there were no events of ventricular tachycardia or vfib.  Code status: Confirmed Full Code status with patient. Patient stated he would like his daughter, Edward Mullen, to make medical decisions on his behalf if he were not able to.   Meds:  Monthly bubble pack from [facility]  Amiodarone  200 mg every day Aspirin  81 mg every day Carvedilol  3.125 mg BID Jardiance  10 mg every day Gabapentin  600 mg bid Losartan  25 mg qd Mexiletine 150 mg BID Nortriptyline  25 mg every day Rosuvastatin  20 mg every day Spironolactone  12.5 mg qd Tamsulosin  0.4 mg every day Torsemide  20 mg qd  Allergies: Allergies as of 12/28/2023   (No Known Allergies)   Past Medical History:  Diagnosis Date   Arthritis    "feels like it in my legs" (09/20/2014)   CAD (coronary artery disease)    Chronic kidney disease, stage 3a (HCC)    Chronic systolic CHF (congestive heart failure) (HCC)    Cocaine  abuse (HCC)    Diabetes mellitus without complication (HCC) 10/05/2021   type 2   Heart murmur    Hypertension    NICM (nonischemic cardiomyopathy) (HCC)  NSVT (nonsustained ventricular tachycardia) (HCC) 09/06/2016   pre diabetes    patient denies    Family History: no known history of syncope  Social History: previously used cocaine  but hasn't in 3 months. Used to smoke tobacco products. Currently smokes marijuana. Denies alcohol use. Support system with daughter and granddaughter Uses walker at home  Review of Systems: A complete ROS was negative except as per HPI.   Physical Exam: Blood pressure 93/68, pulse (!) 59, temperature 98.4 F (36.9 C), temperature source Oral, resp. rate 15, height 6' (1.829 m), weight 86.2 kg, SpO2 100%. General: Alert and oriented x3,  chronically ill appearing Lungs: CTA b/l, normal work of breathing Heart- RRR, no murmur appreciated GI- soft, non-tender, non-distended Extremities- warm lower extremities, no edema present, no hematoma along left chest wall steri strips remain in place Skin- decreased skin turgor  EKG: ventricular paced rhythm, some morphology changes to V2 and V3 since lead revision  CXR: no pulmonary congestion noted  CT head: no subdural hematomas present     Latest Ref Rng & Units 12/28/2023    5:48 PM 12/28/2023    5:33 PM 12/15/2023   11:12 AM  BMP  Glucose 70 - 99 mg/dL 409  811  83   BUN 8 - 23 mg/dL 31  31  41   Creatinine 0.61 - 1.24 mg/dL 9.14  7.82  9.56   BUN/Creat Ratio 10 - 24   15   Sodium 135 - 145 mmol/L 138  137  140   Potassium 3.5 - 5.1 mmol/L 3.5  3.5  4.2   Chloride 98 - 111 mmol/L 102  104  104   CO2 22 - 32 mmol/L  21  17   Calcium  8.9 - 10.3 mg/dL  9.0  9.9       Latest Ref Rng & Units 12/28/2023    5:48 PM 12/28/2023    5:33 PM 12/15/2023   11:12 AM  CBC  WBC 4.0 - 10.5 K/uL  4.7  4.2   Hemoglobin 13.0 - 17.0 g/dL 21.3  08.6  57.8   Hematocrit 39.0 - 52.0 % 36.0  37.8  41.5   Platelets 150 - 400 K/uL  83  116    TSH 0.593  Rapid drug screen + THC Ethanol <15  Trop 37-> 36  BNP 104 from 300s  Assessment & Plan by Problem: Principal Problem:   Syncope  Syncope History of ventricular tachycardia with out of hospital arrest Patient presents with episode of syncope with prodromal symptoms including dizziness followed by unresponsiveness. Differentials include hypovolemia, arrhythmia, versus medication induced. Blood pressure noted to be hypotensive and responsive to fluids. His blood pressures were also noted to be low at ED visit in April with AKI. He is on several medications for heart failure that could make hypotensive.  With recent RV lead exchange, high concern for arrhythmia leading to episode. Interrogation by ED showed no VT or Vfib.  THC use could also  contribute.  -consult EP 5/19. Although interrogation of device did not show VT or VFIB would want their opinion with recent lead exchange -hold carvedilol  3.125 mg bid, jardiance  10 mg, losartan  25 mg, torsemide  20 mg -hold nortriptyline  25 mg -PT evaluate, needs orthostatics -echo -check telemetry -continue amiodarone  200 mg every day, mexiletine 150 mg bid, aspirin  81 mg  AKI on CKD stage 3a Baseline creatinine appears around 1.5-1.8. It was elevated into 3 at ED visit when RV lead first detected. Has improved but not  returned to baseline. He appears hypovolemic on exam and has received 1.5 L of fluids.   -strict I and Os -trend creatinine  Chronic stable issues: HFrEF Polysubstance abuses Last echo with report 7/24 with EF <20%. He appears hypovolemic on exam and is at dry weight of 190 lbs. Heart failure thought to be in setting of cocaine  use. He reports to using in 3 months. Prior heart cath in 2023 with small branch 80% stenosed treating medically. - hold torsemide  20 mg, carvedilol  3.125 mg bid, jardiance  10 mg, losartan  25 mg and torsemide  20 mg  BPH - hold tamsulosin  0.4 mg  Thrombocytopenia Platelets at 83. Baseline appears to be between 90s to 100s. No history of cirrhosis.   -trend CBC  Dispo: Admit patient to Observation with expected length of stay less than 2 midnights.  Signed: Norene Beards. Deyona Soza, D.O.  Internal Medicine Resident, PGY-3 [facility]  1:13 AM, 12/29/2023   **Please contact the on call pager at [phone] or Pager: [phone]

## 2023-12-28 NOTE — ED Triage Notes (Signed)
 Pt arrives via [facility] found unresponsive on porch. Pt got pace maker replaced 3-4 days ago due to it malfunctioning. EMS arrived and patient also unresponsive. Pt dizzy per EMS. Denies CP/SOB.   60 palp initially 500 NS bolus - 84/52 119 CBG  18G L FA

## 2023-12-28 NOTE — Hospital Course (Addendum)
 Edward Mullen is a 68 year old male with history of HFrEF, V. tach with cardiac arrest and ICD placement, CKD stage IIIa who presented after a syncopal episode that was most consistent with hypovolemia during part to low p.o. intake and likely medication effects.  His ICD was in interrogated on admission and EP was consulted due to his recent RV lead exchange.  There is no abnormal arrhythmias and no issues with his ICD although it does seem to be causing some muscle twitching in his abdomen that occurs at high voltages.  On admission we held most of his GDMT including his beta-blocker, SGLT2 inhibitor, ARB, MRA, and loop diuretic along with his TCA and alpha-blocker.  Initially his blood pressures were slightly low and he had a mild AKI which improved after IV fluids.  His orthostatics were also positive on admission but after fluids were negative.  Repeat echocardiogram here was stable from his prior.  On discharge his loop diuretic and MRA were held while his other medications were resumed.  He has follow-up with EP and heart failure early next month on 6/2 and 6/9 respectively.  He is also recommended to follow-up with his primary care provider and take his discharge instructions with him that has outlined which medications he is to stop.  Additionally he has planned to have urologic surgery which is probably a TURP due to his BPH but this has been postponed in the past due to being on aspirin .  From chart review it does appear that he is on aspirin  for CAD without stent placement based on left heart cath from 10/2021.  I do think it would be low risk for him to come off his aspirin  for this procedure however he should discuss this with his cardiologist.

## 2023-12-29 ENCOUNTER — Observation Stay (HOSPITAL_COMMUNITY)

## 2023-12-29 DIAGNOSIS — R55 Syncope and collapse: Principal | ICD-10-CM

## 2023-12-29 DIAGNOSIS — N179 Acute kidney failure, unspecified: Secondary | ICD-10-CM | POA: Diagnosis not present

## 2023-12-29 DIAGNOSIS — I5020 Unspecified systolic (congestive) heart failure: Secondary | ICD-10-CM

## 2023-12-29 DIAGNOSIS — F1910 Other psychoactive substance abuse, uncomplicated: Secondary | ICD-10-CM

## 2023-12-29 DIAGNOSIS — D696 Thrombocytopenia, unspecified: Secondary | ICD-10-CM

## 2023-12-29 DIAGNOSIS — N1831 Chronic kidney disease, stage 3a: Secondary | ICD-10-CM

## 2023-12-29 DIAGNOSIS — N400 Enlarged prostate without lower urinary tract symptoms: Secondary | ICD-10-CM

## 2023-12-29 LAB — CBC
HCT: 38.5 % — ABNORMAL LOW (ref 39.0–52.0)
Hemoglobin: 12 g/dL — ABNORMAL LOW (ref 13.0–17.0)
MCH: 27.5 pg (ref 26.0–34.0)
MCHC: 31.2 g/dL (ref 30.0–36.0)
MCV: 88.3 fL (ref 80.0–100.0)
Platelets: 93 10*3/uL — ABNORMAL LOW (ref 150–400)
RBC: 4.36 MIL/uL (ref 4.22–5.81)
RDW: 14.8 % (ref 11.5–15.5)
WBC: 4.2 10*3/uL (ref 4.0–10.5)
nRBC: 0 % (ref 0.0–0.2)

## 2023-12-29 LAB — ECHOCARDIOGRAM COMPLETE
AR max vel: 3.21 cm2
AV Peak grad: 13.7 mmHg
Ao pk vel: 1.85 m/s
Area-P 1/2: 2.68 cm2
Height: 72 in
MV VTI: 3.21 cm2
S' Lateral: 5.65 cm
Weight: 3040 [oz_av]

## 2023-12-29 LAB — BASIC METABOLIC PANEL WITH GFR
Anion gap: 8 (ref 5–15)
BUN: 30 mg/dL — ABNORMAL HIGH (ref 8–23)
CO2: 25 mmol/L (ref 22–32)
Calcium: 9.1 mg/dL (ref 8.9–10.3)
Chloride: 104 mmol/L (ref 98–111)
Creatinine, Ser: 2.09 mg/dL — ABNORMAL HIGH (ref 0.61–1.24)
GFR, Estimated: 34 mL/min — ABNORMAL LOW (ref >60)
Glucose, Bld: 86 mg/dL (ref 70–99)
Potassium: 3.7 mmol/L (ref 3.5–5.1)
Sodium: 137 mmol/L (ref 135–145)

## 2023-12-29 LAB — TSH: TSH: 0.593 u[IU]/mL (ref 0.350–4.500)

## 2023-12-29 MED ORDER — SENNA 8.6 MG PO TABS
ORAL_TABLET | ORAL | Status: AC
  Filled 2023-12-29: qty 1

## 2023-12-29 MED ORDER — ACETAMINOPHEN 500 MG PO TABS
ORAL_TABLET | ORAL | Status: AC
  Filled 2023-12-29: qty 2

## 2023-12-29 MED ORDER — ASPIRIN 81 MG PO TBEC
81.0000 mg | DELAYED_RELEASE_TABLET | Freq: Every day | ORAL | Status: DC
  Administered 2023-12-29 – 2023-12-30 (×2): 81 mg via ORAL
  Filled 2023-12-29 (×2): qty 1

## 2023-12-29 MED ORDER — PERFLUTREN LIPID MICROSPHERE
1.0000 mL | INTRAVENOUS | Status: AC | PRN
  Administered 2023-12-29: 2 mL via INTRAVENOUS

## 2023-12-29 MED ORDER — ROSUVASTATIN CALCIUM 20 MG PO TABS
20.0000 mg | ORAL_TABLET | Freq: Every day | ORAL | Status: DC
  Administered 2023-12-29 – 2023-12-30 (×2): 20 mg via ORAL
  Filled 2023-12-29 (×2): qty 1

## 2023-12-29 MED ORDER — POLYETHYLENE GLYCOL 3350 17 G PO PACK
17.0000 g | PACK | Freq: Every day | ORAL | Status: DC
  Administered 2023-12-30: 17 g via ORAL
  Filled 2023-12-29: qty 1

## 2023-12-29 MED ORDER — POLYETHYLENE GLYCOL 3350 17 G PO PACK
PACK | ORAL | Status: AC
  Filled 2023-12-29: qty 1

## 2023-12-29 MED ORDER — AMIODARONE HCL 200 MG PO TABS
200.0000 mg | ORAL_TABLET | Freq: Every day | ORAL | Status: DC
  Administered 2023-12-29 – 2023-12-30 (×2): 200 mg via ORAL
  Filled 2023-12-29 (×2): qty 1

## 2023-12-29 MED ORDER — ACETAMINOPHEN 500 MG PO TABS
1000.0000 mg | ORAL_TABLET | Freq: Three times a day (TID) | ORAL | Status: DC | PRN
  Administered 2023-12-29 (×2): 1000 mg via ORAL

## 2023-12-29 MED ORDER — MEXILETINE HCL 150 MG PO CAPS
150.0000 mg | ORAL_CAPSULE | Freq: Two times a day (BID) | ORAL | Status: DC
  Administered 2023-12-29 – 2023-12-30 (×3): 150 mg via ORAL
  Filled 2023-12-29 (×5): qty 1

## 2023-12-29 MED ORDER — ACETAMINOPHEN 325 MG PO TABS
ORAL_TABLET | ORAL | Status: AC
  Filled 2023-12-29: qty 2

## 2023-12-29 MED ORDER — SENNOSIDES-DOCUSATE SODIUM 8.6-50 MG PO TABS
1.0000 | ORAL_TABLET | Freq: Two times a day (BID) | ORAL | Status: DC
  Administered 2023-12-30: 1 via ORAL
  Filled 2023-12-29: qty 1

## 2023-12-29 NOTE — Progress Notes (Signed)
   Device interrogated by industry which shows normal device function and no episodes.   May be intermittently having stim thorugh his LV lead, but only reproducible with testing at higher voltages.   Currently, no concerns from an EP perspective.  Will keep follow up in place.   Bambi Lever [address]" [place], PA-C  12/29/2023 11:18 AM

## 2023-12-29 NOTE — Plan of Care (Signed)

## 2023-12-29 NOTE — Progress Notes (Signed)
 Transition of Care [facility]) - Inpatient Brief Assessment   Patient Details  Name: Edward Mullen MRN: [id number] Date of Birth: 11/21/1955  Transition of Care [name]) CM/SW Contact:    Dane Dung, RN Phone Number: 12/29/2023, 12:05 PM   Clinical Narrative: CM met with patient at the bedside to discuss TOC needs.  No needs at this time.  Patient admitted for Syncope - [place] letter provided at the bedside.  Patient was positive for [facility] with admission to the hospital - resources provided.  No other TOC needs at this time.   Transition of Care Asessment: Insurance and Status: (P) Insurance coverage has been reviewed Patient has primary care physician: (P) Yes Home environment has been reviewed: (P) from home with daughter Prior level of function:: (P) Hotel manager Home Services: (P) No current home services Social Drivers of Health Review: (P) SDOH reviewed interventions complete Readmission risk has been reviewed: (P) Yes Transition of care needs: (P) transition of care needs identified, TOC will continue to follow

## 2023-12-29 NOTE — Care Management Obs Status (Cosign Needed)
 MEDICARE OBSERVATION STATUS NOTIFICATION   Patient Details  Name: ARMANI BRAR MRN: [id number] Date of Birth: 08-Feb-1956   Medicare Observation Status Notification Given:  Yes    Dane Dung, RN 12/29/2023, 12:01 PM

## 2023-12-29 NOTE — Evaluation (Signed)
 Physical Therapy Brief Evaluation and Discharge Note Patient Details Name: Edward Mullen MRN: [id number] DOB: Jun 22, 1956 Today's Date: 12/29/2023   History of Present Illness  68 yo male admitted 5/18 after found unresponsive on porch. PMhx: 12/22/23 pacemaker replaced, HTN, AKI, polusubstance abuse, NICM, CKD, CHF, T2DM, COPD, HLD, hypotension, HfrEF  Clinical Impression  Pt pleasant stating he has been smoking pot since 11 and still enjoys it and TV. Pt has DME at home, family able to assist and reports no other hx of falls. Pt walking with RW with flexed trunk without physical assist, cues for proper use but pt states he will continue with improper technique. Pt at baseline without further therapy needs defer to mobility specialists/nursing.      Orthostatic BPs  Supine 111/82 (92), HR 74  Sitting 103/76 (85), HR 79     Standing 120/82 (95), HR 80  Standing after 3 min 137/83 (99), HR 87      PT Assessment Patient does not need any further PT services  Assistance Needed at Discharge  None    Equipment Recommendations None recommended by PT  Recommendations for Other Services       Precautions/Restrictions Precautions Precautions: Fall        Mobility  Bed Mobility Rolling: Modified independent (Device/Increase time)        Transfers Overall transfer level: Modified independent                      Ambulation/Gait Ambulation/Gait assistance: Modified independent (Device/Increase time) Gait Distance (Feet): 300 Feet Assistive device: Rolling walker (2 wheels) Gait Pattern/deviations: Step-through pattern, Decreased stride length, Trunk flexed Gait Speed: Pace WFL General Gait Details: pt with flexed trunk and RW too anterior, cues for correction but pt states baseline, able to correct briefly but then reverts  Home Activity Instructions    Stairs Stairs: Yes Stairs assistance: Modified independent (Device/Increase time) Stair Management: Two  rails, Alternating pattern, Forwards Number of Stairs: 5    Modified Rankin (Stroke Patients Only)        Balance Overall balance assessment: Mild deficits observed, not formally tested                        Pertinent Vitals/Pain PT - Brief Vital Signs All Vital Signs Stable: Yes Pain Assessment Pain Assessment: No/denies pain     Home Living Family/patient expects to be discharged to:: Private residence Living Arrangements: Children Available Help at Discharge: Family;Available 24 hours/day Home Environment: Stairs to enter  [employer] of Steps: 5 Home Equipment: Agricultural consultant (2 wheels);Cane - single point;Shower seat   Additional Comments: lives with daughter    Prior Function Level of Independence: Independent with assistive device(s) Comments: walker or cane at baseline    UE/LE Assessment   UE ROM/Strength/Tone/Coordination: [facility]    LE ROM/Strength/Tone/Coordination: [facility]      Communication   Communication Communication: No apparent difficulties     Cognition Overall Cognitive Status: Appears within functional limits for tasks assessed/performed       General Comments      Exercises     Assessment/Plan    PT Problem List         PT Visit Diagnosis Other abnormalities of gait and mobility (R26.89)    No Skilled PT All education completed;Patient at baseline level of functioning;Patient will have necessary level of assist by caregiver at discharge   Co-evaluation  AMPAC 6 Clicks Help needed turning from your back to your side while in a flat bed without using bedrails?: None Help needed moving from lying on your back to sitting on the side of a flat bed without using bedrails?: None Help needed moving to and from a bed to a chair (including a wheelchair)?: None Help needed standing up from a chair using your arms (e.g., wheelchair or bedside chair)?: None Help needed to walk in hospital room?: A  Little Help needed climbing 3-5 steps with a railing? : A Little 6 Click Score: 22      End of Session   Activity Tolerance: Patient tolerated treatment well Patient left: in chair;with call bell/phone within reach;with chair alarm set Nurse Communication: Mobility status PT Visit Diagnosis: Other abnormalities of gait and mobility (R26.89)     Time: 1610-9604 PT Time Calculation (min) (ACUTE ONLY): 17 min  Charges:   PT Evaluation $PT Eval Low Complexity: 1 Low      Daytona Hedman P, PT Acute Rehabilitation Services Office: [phone]   Jackey Mary Christiona Siddique  12/29/2023, 10:08 AM

## 2023-12-29 NOTE — Progress Notes (Addendum)
 Subjective:  NAEO. Patient is overall doing well this morning. Describes a kicking/pulsating sensation that radiates to his left lower abdomen that was present on 5/12 when RV lead lead was replaced. Nothing was done at that time. Reports same kicking sensation the morning of his syncopal episode yesterday. Has been eating and drinking well at home. Is very in tune with his medications, has now restarted all of his home medications as of last Wednesday--2 medications were being held per Cardiology recommendations. All medications come in a bubble pack.   Objective:  Vital signs in last 24 hours: Vitals:   12/29/23 0245 12/29/23 0300 12/29/23 0400 12/29/23 0553  BP: 120/71 101/66 99/66 117/74  Pulse:  69 63 65  Resp: 20 16 13 18   Temp:    (!) 97.3 F (36.3 C)  TempSrc:    Oral  SpO2:  97% 98% 100%  Weight:      Height:       Weight change:   Intake/Output Summary (Last 24 hours) at 12/29/2023 0635 Last data filed at 12/29/2023 0235 Gross per 24 hour  Intake 1050 ml  Output 625 ml  Net 425 ml   Constitutional: Well appearing, no acute distress HENT: Moist mucus membranes, normal skin turgor Cardiovascular: Regular rate and rhythm. Pulmonary/Chest: normal work of breathing on room air, lungs clear to auscultation bilaterally. Abdominal: BS present, soft, non-tender, non-distended. MSK: Moving all extremities spontaneously Neurological: alert & oriented x 3 Skin: ICD incision covered with steri strips and is clean, dry and intact. Extremities are warm and well perfused. Psych: Pleasant mood and affect  Assessment/Plan: Principal Problem:   Syncope  Syncope History of ventricular tachycardia with out of hospital arrest Etiology of current episode of syncope with prodromal symptoms including dizziness followed by unresponsiveness is unknown at this time. DDx includes hypovolemia, arrhythmia/lead malfunction or medication induced. Arrhythmia/lead dysfunction is most likely at  this time given pt reported symptoms of a pulsating kick that started near the leads insertion and radiated to left lower abdomen prior to losing consciousness. ICD was interrogated on admission and did not show VT or VFib. Medication induced syncope 2/2 is also likely given patient restarted all of his medications on Wednesday, but less likely given he felt well until Sunday morning and pulsating kick making arrhythmia/lead dysfunction. Hypovolemia is possible given patient presented with AKI (Cr slightly above baseline) is less likely given patient reports normal PO intake at home. THC use also possible contributor, but has used daily for years without subsequent lightheadedness or LOC. Normotensive this morning. - EP consulted, appreciate recs given recent lead change - Continue amiodarone  200 mg every day, mexiletine 150 mg bid and aspirin  81 mg - Holding carvedilol  3.125 mg bid, jardiance  10 mg, losartan  25 mg, torsemide  20 mg - Holding nortriptyline  25 mg - Echo ordered - PT/OT consulted, orthostatics ordered   AKI on CKD stage 3a Baseline creatinine ~1.5-1.8. Was elevated to 3 at ED visit when RV lead first detected. S/p 1.5 L fluids yesterday. Cr downtrending this morning to 2.09. Appears euvolemic on exam today. - Strict I&Os - Trend Cr   Chronic stable issues: HFrEF Polysubstance abuses Last Echo in 7/24 with EF <20%. Heart failure thought to be in setting of cocaine  use.  Prior heart cath in 2023 with small branch 80% stenosed treating medically. - Holding torsemide  20 mg, carvedilol  3.125 mg bid, jardiance  10 mg, losartan  25 mg and torsemide  20 mg in the setting of soft pressures on admission, pressures  now normotensive, can consider restarting if pressures remain normal and concern for hypovolemia is less likely at this time    BPH Per patient, was being considered for TURP, but ultimately deferred in the setting of ASA use. UOP adequate so far this admission.  - Holding tamsulosin   0.4 mg   Thrombocytopenia Platelets at 83. Baseline appears to be between 90s to 100s. No history of cirrhosis.  - Trend CBC   LOS: 0 days   Teyona Nichelson, Nana Avers, Medical Student 12/29/2023, 6:35 AM

## 2023-12-29 NOTE — Progress Notes (Signed)
 Mobility Specialist Progress Note:    12/29/23 1400  Orthostatic Sitting  BP- Sitting 122/80 (93)  Mobility  Activity Ambulated with assistance in hallway  Level of Assistance Modified independent, requires aide device or extra time  Assistive Device Front wheel walker  Distance Ambulated (ft) 300 ft  Activity Response Tolerated well  Mobility Referral Yes  Mobility visit 1 Mobility  Mobility Specialist Start Time (ACUTE ONLY) 1344  Mobility Specialist Stop Time (ACUTE ONLY) 1355  Mobility Specialist Time Calculation (min) (ACUTE ONLY) 11 min   Received pt in bed having no complaints and agreeable to mobility. Pt was asymptomatic throughout ambulation and returned to room w/o fault. Left in bed w/ call bell in reach and all needs met.   D'Vante Nolon Baxter Mobility Specialist Please contact via Special educational needs teacher or Rehab office at [phone]

## 2023-12-30 DIAGNOSIS — R55 Syncope and collapse: Secondary | ICD-10-CM | POA: Diagnosis not present

## 2023-12-30 LAB — BASIC METABOLIC PANEL WITH GFR
Anion gap: 6 (ref 5–15)
BUN: 22 mg/dL (ref 8–23)
CO2: 26 mmol/L (ref 22–32)
Calcium: 9.4 mg/dL (ref 8.9–10.3)
Chloride: 105 mmol/L (ref 98–111)
Creatinine, Ser: 1.66 mg/dL — ABNORMAL HIGH (ref 0.61–1.24)
GFR, Estimated: 44 mL/min — ABNORMAL LOW (ref >60)
Glucose, Bld: 92 mg/dL (ref 70–99)
Potassium: 4.5 mmol/L (ref 3.5–5.1)
Sodium: 137 mmol/L (ref 135–145)

## 2023-12-30 NOTE — Discharge Instructions (Addendum)
 Edward Mullen,  You were recently admitted to [facility] for a passing out episode. We think you were dehydrated and that made you feel dizzy and pass out. We are glad this has gotten better after we gave you some fluids and adjusted your medications. Please see the medication list below and talk to your pharmacist about which medications to take out of your pill packs. Please follow up with your primary care provider and cardiologists and make sure they know which medications you have stopped.  Continue taking your home medications with the following changes  Stop taking Torsemide  Spironolactone     You should seek further medical care if you have any return of symptoms such as severe dizziness or lightheadedness or passing out again.  We recommend that you see your primary care doctor in about a week to make sure that you continue to improve. We are so glad that you are feeling better.  Sincerely, Cleven Dallas, DO

## 2023-12-30 NOTE — Discharge Summary (Signed)
 Name: Edward Mullen MRN: [id number] DOB: 12/12/1955 68 y.o. PCP: Hershell Lose, NP  Date of Admission: 12/28/2023  3:48 PM Date of Discharge: 12/30/2023 Attending Physician: Dr. Broadus Canes  Discharge Diagnosis: Principal Problem:   Syncope Active Problems:   AKI (acute kidney injury) (HCC)   Polysubstance abuse (HCC)   HFrEF (heart failure with reduced ejection fraction) (HCC)   CKD stage 3a, GFR 45-59 ml/min [facility])    Discharge Medications: Allergies as of 12/30/2023   No Known Allergies      Medication List     PAUSE taking these medications    torsemide  20 MG tablet Wait to take this until your doctor or other care provider tells you to start again. Commonly known as: DEMADEX  TAKE 1 TABLET (20 MG TOTAL) BY MOUTH DAILY (AM)       TAKE these medications    acetaminophen  500 MG tablet Commonly known as: TYLENOL  Take 1,000 mg by mouth every 6 (six) hours as needed for mild pain (pain score 1-3) or moderate pain (pain score 4-6).   albuterol  108 (90 Base) MCG/ACT inhaler Commonly known as: VENTOLIN  HFA INHALE 1 TO 2 PUFFS BY MOUTH EVERY 6 HOURS AS NEEDED FOR WHEEZING OR SHORTNESS OF BREATH What changed: See the new instructions.   amiodarone  200 MG tablet Commonly known as: PACERONE  Take 1 tablet (200 mg total) by mouth 2 (two) times daily for 10 days. Then resume 200mg  once daily. What changed:  when to take this additional instructions   Aspirin  Low Dose 81 MG tablet Generic drug: aspirin  EC TAKE 1 TABLET (81 MG TOTAL) BY MOUTH DAILY. SWALLOW WHOLE(AM)   carvedilol  3.125 MG tablet Commonly known as: Coreg  Take 1 tablet (3.125 mg total) by mouth 2 (two) times daily.   gabapentin  600 MG tablet Commonly known as: NEURONTIN  Take 600 mg by mouth 2 (two) times daily.   Jardiance  10 MG Tabs tablet Generic drug: empagliflozin  Take 10 mg by mouth daily.   losartan  25 MG tablet Commonly known as: COZAAR  Take 25 mg by mouth daily.   mexiletine 150 MG  capsule Commonly known as: MEXITIL  TAKE 1 CAPSULE BY MOUTH 2 (TWO) TIMES DAILY (AM+BEDTIME)   nortriptyline  25 MG capsule Commonly known as: PAMELOR  Take 25 mg by mouth at bedtime.   rosuvastatin  20 MG tablet Commonly known as: CRESTOR  Take 1 tablet (20 mg total) by mouth daily.   Stiolto Respimat 2.5-2.5 MCG/ACT Aers Generic drug: Tiotropium Bromide-Olodaterol Inhale 2 puffs into the lungs daily.   tamsulosin  0.4 MG Caps capsule Commonly known as: FLOMAX  Take 0.4 mg by mouth at bedtime.   Vitamin D3 50 MCG (2000 UT) Tabs Take 2,000 Units by mouth daily.        Disposition and follow-up:   [name] was discharged from [facility] in Good condition.  At the hospital follow up visit please address:  1.  Follow-up:  a.  Evaluate for volume accumulation since he has been off his loop diuretic and recheck his blood pressure since he has been off his MRA.    b.  Assess for any return of presyncope symptoms or recurrent syncope as well as any muscle twitches from his ICD.  He may need further adjustment with EP.   c.  Sure he is able to get back in with urology and discuss pausing aspirin  for the procedure with cardiology if necessary.   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 intake and likely medication effects.  His ICD was in interrogated on admission and EP was consulted due to his recent RV lead exchange.  There is no abnormal arrhythmias and no issues with his ICD although it does seem to be causing some muscle twitching in his abdomen that occurs at high voltages.  On admission we held most of his GDMT including his beta-blocker, SGLT2 inhibitor, ARB, MRA, and loop diuretic along with his TCA and alpha-blocker.  Initially his blood  pressures were slightly low and he had a mild AKI which improved after IV fluids.  His orthostatics were also positive on admission but after fluids were negative.  Repeat echocardiogram here was stable from his prior.  On discharge his loop diuretic and MRA were held while his other medications were resumed.  He has follow-up with EP and heart failure early next month on 6/2 and 6/9 respectively.  He is also recommended to follow-up with his primary care provider and take his discharge instructions with him that has outlined which medications he is to stop.  Additionally he has planned to have urologic surgery which is probably a TURP due to his BPH but this has been postponed in the past due to being on aspirin .  From chart review it does appear that he is on aspirin  for CAD without stent placement based on left heart cath from 10/2021.  I do think it would be low risk for him to come off his aspirin  for this procedure however he should discuss this with his cardiologist.   Discharge Subjective: Doing well this morning with only a couple episodes of the muscle twitching in his abdomen although better than yesterday.  No new or worsening complaints.  Discharge Exam:   BP 138/83 (BP Location: Right Arm)   Pulse 64   Temp 98 F (36.7 C)   Resp 18   Ht 6' (1.829 m)   Wt 94.3 kg   SpO2 100%   BMI 28.20 kg/m  Constitutional: well-appearing male sitting in bed, in no acute distress Pulmonary/Chest: normal work of breathing on room air MSK: normal bulk and tone  Pertinent Labs, Studies, and Procedures:     Latest Ref Rng & Units 12/29/2023    5:44 AM 12/28/2023    5:48 PM 12/28/2023    5:33 PM  CBC  WBC 4.0 - 10.5 K/uL 4.2   4.7   Hemoglobin 13.0 - 17.0 g/dL 47.8  29.5  62.1   Hematocrit 39.0 - 52.0 % 38.5  36.0  37.8   Platelets 150 - 400 K/uL 93   83        Latest Ref Rng & Units 12/30/2023    7:48 AM 12/29/2023    5:44 AM 12/28/2023    5:48 PM  CMP  Glucose 70 - 99 mg/dL 92  86  308    BUN 8 - 23 mg/dL 22  30  31    Creatinine 0.61 - 1.24 mg/dL 6.57  8.46  9.62   Sodium 135 - 145 mmol/L 137  137  138   Potassium 3.5 - 5.1 mmol/L 4.5  3.7  3.5   Chloride 98 - 111 mmol/L 105  104  102   CO2 22 - 32 mmol/L 26  25    Calcium  8.9 - 10.3 mg/dL 9.4  9.1  ECHOCARDIOGRAM COMPLETE Result Date: 12/29/2023    ECHOCARDIOGRAM REPORT   Patient Name:   FREDDY KINNE Date of Exam: 12/29/2023 Medical Rec #:  [id number]         Height:       72.0 in Accession #:    [id number]        Weight:       190.0 lb Date of Birth:  1955-08-13        BSA:          2.085 m Patient Age:    56 years          BP:           117/74 mmHg Patient Gender: M                 HR:           64 bpm. Exam Location:  Inpatient Procedure: 2D Echo, Cardiac Doppler, Color Doppler and Intracardiac            Opacification Agent (Both Spectral and Color Flow Doppler were            utilized during procedure). Indications:    Syncope  History:        Patient has prior history of Echocardiogram examinations.                 Cardiomyopathy and CHF, COPD, Arrythmias:LBBB; Risk                 Factors:Hypertension, Current Smoker and Diabetes.  Sonographer:    Willey Harrier Referring Phys: [id number] JULIE ANNE WILLIAMS IMPRESSIONS  1. Left ventricular ejection fraction, by estimation, is 25 to 30%. The left ventricle has severely decreased function. The left ventricle demonstrates global hypokinesis. The left ventricular internal cavity size was moderately dilated. Left ventricular diastolic parameters are consistent with Grade I diastolic dysfunction (impaired relaxation).  2. Right ventricular systolic function is normal. The right ventricular size is normal.  3. The mitral valve is normal in structure. Trivial mitral valve regurgitation. No evidence of mitral stenosis.  4. The aortic valve has an indeterminant number of cusps. Aortic valve regurgitation is mild. No aortic stenosis is present.  5. Aortic dilatation noted. There is mild  dilatation of the ascending aorta, measuring 42 mm.  6. The inferior vena cava is normal in size with greater than 50% respiratory variability, suggesting right atrial pressure of 3 mmHg. FINDINGS  Left Ventricle: Left ventricular ejection fraction, by estimation, is 25 to 30%. The left ventricle has severely decreased function. The left ventricle demonstrates global hypokinesis. Definity  contrast agent was given IV to delineate the left ventricular endocardial borders. The left ventricular internal cavity size was moderately dilated. There is no left ventricular hypertrophy. Abnormal (paradoxical) septal motion, consistent with left bundle branch block. Left ventricular diastolic parameters are consistent with Grade I diastolic dysfunction (impaired relaxation). Right Ventricle: The right ventricular size is normal. Right ventricular systolic function is normal. Left Atrium: Left atrial size was normal in size. Right Atrium: Right atrial size was normal in size. Pericardium: There is no evidence of pericardial effusion. Mitral Valve: The mitral valve is normal in structure. Trivial mitral valve regurgitation. No evidence of mitral valve stenosis. MV peak gradient, 4.4 mmHg. The mean mitral valve gradient is 2.0 mmHg. Tricuspid Valve: The tricuspid valve is normal in structure. Tricuspid valve regurgitation is trivial. No evidence of tricuspid stenosis. Aortic Valve: The aortic valve has an indeterminant number of cusps. Aortic valve regurgitation  is mild. No aortic stenosis is present. Aortic valve peak gradient measures 13.7 mmHg. Pulmonic Valve: The pulmonic valve was not well visualized. Pulmonic valve regurgitation is not visualized. No evidence of pulmonic stenosis. Aorta: Aortic dilatation noted. There is mild dilatation of the ascending aorta, measuring 42 mm. Venous: The inferior vena cava is normal in size with greater than 50% respiratory variability, suggesting right atrial pressure of 3 mmHg.  IAS/Shunts: No atrial level shunt detected by color flow Doppler. Additional Comments: A device lead is visualized.  LEFT VENTRICLE PLAX 2D LVIDd:         6.25 cm   Diastology LVIDs:         5.65 cm   LV e' medial:    6.53 cm/s LV PW:         0.85 cm   LV E/e' medial:  10.1 LV IVS:        0.80 cm   LV e' lateral:   8.27 cm/s LVOT diam:     2.20 cm   LV E/e' lateral: 8.0 LV SV:         101 LV SV Index:   48 LVOT Area:     3.80 cm  RIGHT VENTRICLE RV Basal diam:  4.00 cm RV S prime:     11.70 cm/s TAPSE (M-mode): 1.7 cm LEFT ATRIUM           Index        RIGHT ATRIUM           Index LA Vol (A2C): 52.6 ml 25.23 ml/m  RA Area:     12.60 cm LA Vol (A4C): 39.0 ml 18.71 ml/m  RA Volume:   29.70 ml  14.25 ml/m  AORTIC VALVE AV Area (Vmax): 3.21 cm AV Vmax:        185.00 cm/s AV Peak Grad:   13.7 mmHg LVOT Vmax:      156.00 cm/s LVOT Vmean:     105.000 cm/s LVOT VTI:       0.265 m  AORTA Ao Root diam: 2.45 cm Ao Asc diam:  4.20 cm MITRAL VALVE MV Area (PHT): 2.68 cm    SHUNTS MV Area VTI:   3.21 cm    Systemic VTI:  0.26 m MV Peak grad:  4.4 mmHg    Systemic Diam: 2.20 cm MV Mean grad:  2.0 mmHg MV Vmax:       1.05 m/s MV Vmean:      67.2 cm/s MV Decel Time: 283 msec MV E velocity: 66.00 cm/s MV A velocity: 98.00 cm/s MV E/A ratio:  0.67 Alexandria Angel MD Electronically signed by Alexandria Angel MD Signature Date/Time: 12/29/2023/1:16:24 PM    Final    CT Head Wo Contrast Result Date: 12/28/2023 CLINICAL DATA:  Syncope/presyncope. EXAM: CT HEAD WITHOUT CONTRAST TECHNIQUE: Contiguous axial images were obtained from the base of the skull through the vertex without intravenous contrast. RADIATION DOSE REDUCTION: This exam was performed according to the departmental dose-optimization program which includes automated exposure control, adjustment of the mA and/or kV according to patient size and/or use of iterative reconstruction technique. COMPARISON:  Head CT 03/10/2023 FINDINGS: Brain: No evidence of acute infarction,  hemorrhage, hydrocephalus, extra-axial collection or mass lesion/mass effect. There is mild periventricular white matter hypodensity. 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 Vascular: Atherosclerotic calcifications are present within the cavernous internal carotid arteries. Skull: Normal. Negative for fracture or focal lesion. Sinuses/Orbits: No acute finding. There is a small polyp  or mucous retention cyst in the left maxillary sinus. Other: None. IMPRESSION: 1. No acute intracranial process. 2. Mild chronic small vessel ischemic changes. Electronically Signed   By: Tyron Gallon M.D.   On: 12/28/2023 18:40   DG Chest Portable 1 View Result Date: 12/28/2023 CLINICAL DATA:  Syncope EXAM: PORTABLE CHEST 1 VIEW COMPARISON:  Chest x-ray 12/23/2023 FINDINGS: The heart is enlarged. The lungs are clear. There is no pleural effusion or pneumothorax. Left-sided ICD is present. No acute fractures are seen. IMPRESSION: Cardiomegaly. No acute cardiopulmonary process. Electronically Signed   By: Tyron Gallon M.D.   On: 12/28/2023 17:04     Discharge Instructions: Discharge Instructions     Diet - low sodium heart healthy   Complete by: As directed    Increase activity slowly   Complete by: As directed        Signed: [place], DO Internal Medicine Resident, PGY-2 Pager# [phone] 12/30/2023, 3:41 PM   Please contact the on call pager after 5 pm and on weekends at [phone].

## 2023-12-30 NOTE — Progress Notes (Signed)
 Mobility Specialist: Progress Note   12/30/23 0919  Mobility  Activity Ambulated independently in hallway  Level of Assistance Modified independent, requires aide device or extra time  Assistive Device Front wheel walker  Distance Ambulated (ft) 400 ft  Activity Response Tolerated well  Mobility Referral Yes  Mobility visit 1 Mobility  Mobility Specialist Start Time (ACUTE ONLY) 0854  Mobility Specialist Stop Time (ACUTE ONLY) 0907  Mobility Specialist Time Calculation (min) (ACUTE ONLY) 13 min    Received pt in bed having no complaints and agreeable to mobility. Pt was asymptomatic throughout ambulation and returned to room w/o fault. Left in bed w/ call bell in reach and all needs met.   Deloria Fetch Mobility Specialist Please contact via SecureChat or Rehab office at [phone]

## 2023-12-30 NOTE — Plan of Care (Signed)

## 2024-01-06 ENCOUNTER — Ambulatory Visit (HOSPITAL_COMMUNITY)

## 2024-01-07 ENCOUNTER — Ambulatory Visit

## 2024-01-12 ENCOUNTER — Ambulatory Visit: Payer: Self-pay | Admitting: Cardiology

## 2024-01-12 ENCOUNTER — Ambulatory Visit: Attending: Student | Admitting: Student

## 2024-01-12 ENCOUNTER — Encounter: Payer: Self-pay | Admitting: Student

## 2024-01-12 VITALS — BP 110/60 | HR 62 | Ht 72.0 in | Wt 216.0 lb

## 2024-01-12 DIAGNOSIS — I5022 Chronic systolic (congestive) heart failure: Secondary | ICD-10-CM

## 2024-01-12 DIAGNOSIS — Z8674 Personal history of sudden cardiac arrest: Secondary | ICD-10-CM | POA: Diagnosis not present

## 2024-01-12 DIAGNOSIS — T82110A Breakdown (mechanical) of cardiac electrode, initial encounter: Secondary | ICD-10-CM | POA: Diagnosis not present

## 2024-01-12 DIAGNOSIS — F1910 Other psychoactive substance abuse, uncomplicated: Secondary | ICD-10-CM

## 2024-01-12 DIAGNOSIS — Z95810 Presence of automatic (implantable) cardiac defibrillator: Secondary | ICD-10-CM | POA: Diagnosis not present

## 2024-01-12 DIAGNOSIS — I4729 Other ventricular tachycardia: Secondary | ICD-10-CM

## 2024-01-12 LAB — CUP PACEART INCLINIC DEVICE CHECK
Battery Remaining Longevity: 64 mo
Brady Statistic RA Percent Paced: 8.8 %
Brady Statistic RV Percent Paced: 99.2 %
Date Time Interrogation Session: 20250602133123
HighPow Impedance: 57.375
Implantable Lead Connection Status: 753985
Implantable Lead Connection Status: 753985
Implantable Lead Connection Status: 753985
Implantable Lead Connection Status: 753985
Implantable Lead Implant Date: 20231206
Implantable Lead Implant Date: 20231206
Implantable Lead Implant Date: 20231206
Implantable Lead Implant Date: 20250512
Implantable Lead Location: 753858
Implantable Lead Location: 753859
Implantable Lead Location: 753860
Implantable Lead Location: 753860
Implantable Lead Model: 7122
Implantable Lead Model: 7122
Implantable Pulse Generator Implant Date: 20231206
Lead Channel Impedance Value: 400 Ohm
Lead Channel Impedance Value: 550 Ohm
Lead Channel Impedance Value: 625 Ohm
Lead Channel Pacing Threshold Amplitude: 0.5 V
Lead Channel Pacing Threshold Amplitude: 1 V
Lead Channel Pacing Threshold Amplitude: 1.625 V
Lead Channel Pacing Threshold Amplitude: 1.75 V
Lead Channel Pacing Threshold Pulse Width: 0.5 ms
Lead Channel Pacing Threshold Pulse Width: 0.5 ms
Lead Channel Pacing Threshold Pulse Width: 1 ms
Lead Channel Pacing Threshold Pulse Width: 1 ms
Lead Channel Sensing Intrinsic Amplitude: 4.8 mV
Lead Channel Sensing Intrinsic Amplitude: 9.5 mV
Lead Channel Setting Pacing Amplitude: 1.5 V
Lead Channel Setting Pacing Amplitude: 2 V
Lead Channel Setting Pacing Amplitude: 2.625
Lead Channel Setting Pacing Pulse Width: 0.5 ms
Lead Channel Setting Pacing Pulse Width: 1 ms
Lead Channel Setting Sensing Sensitivity: 0.5 mV
Pulse Gen Serial Number: 5556126
Zone Setting Status: 755011

## 2024-01-12 NOTE — Patient Instructions (Signed)
 Medication Instructions:  No medication changes today. *If you need a refill on your cardiac medications before your next appointment, please call your pharmacy*  Lab Work: No labwork ordered today. If you have labs (blood work) drawn today and your tests are completely normal, you will receive your results only by: MyChart Message (if you have MyChart) OR A paper copy in the mail If you have any lab test that is abnormal or we need to change your treatment, we will call you to review the results.  Testing/Procedures: No testing ordered today  Follow-Up: At [facility], you and your health needs are our priority.  As part of our continuing mission to provide you with exceptional heart care, our providers are all part of one team.  This team includes your primary Cardiologist (physician) and Advanced Practice Providers or APPs (Physician Assistants and Nurse Practitioners) who all work together to provide you with the care you need, when you need it.  Your next appointment:   As scheduled with HF team and Mertha Abrahams, PA-C  Provider:   You may see Richardo Chandler, MD or one of the following Advanced Practice Providers on your designated Care Team:   Mertha Abrahams, Kennard Pea "Jonelle Neri" Gazelle, PA-C Suzann Riddle, NP Creighton Doffing, NP    We recommend signing up for the patient portal called "MyChart".  Sign up information is provided on this After Visit Summary.  MyChart is used to connect with patients for Virtual Visits (Telemedicine).  Patients are able to view lab/test results, encounter notes, upcoming appointments, etc.  Non-urgent messages can be sent to your provider as well.   To learn more about what you can do with MyChart, go to [email or web address].

## 2024-01-12 NOTE — Progress Notes (Signed)
  Electrophysiology Office Note:   ID:  Edward Mullen, DOB [birth year], MRN [id number]  Primary Cardiologist: None Electrophysiologist: Richardo Chandler, MD      History of Present Illness:   Edward Mullen is a 68 y.o. male with h/o HFrEF s/p ICD,LBBB, CAD, HTN, HLD COPD, DM2, substance abuse, h/o VT, and h/o RV lead fracture seen today for routine electrophysiology follow-up s/p ICD lead extraction and re-implantation.  Since last being seen in our clinic the patient reports doing OK. Overall, currently denies chest pain, palpitations, dyspnea, PND, orthopnea, nausea, vomiting, dizziness, syncope, edema, weight gain, or early satiety.    Review of systems complete and found to be negative unless listed in HPI.   EP Information / Studies Reviewed:    EKG is ordered today. Personal review as below.  EKG Interpretation Date/Time:  Monday January 12 2024 09:51:10 EDT Ventricular Rate:  60 PR Interval:  196 QRS Duration:  206 QT Interval:  500 QTC Calculation: 500 R Axis:   260  Text Interpretation: AV dual-paced rhythm Biventricular pacemaker detected  Programmed M2-RV coil , PAV/SAV 200 ms. Confirmed by Pilar Bridge [phone]) on 01/12/2024 1:37:29 PM    ICD Interrogation-  reviewed in detail today,  See PACEART report.  Arrhythmia/Device History [name] implanted 07/17/2022 for CHF Extraction and re-implantation 12/22/2023 for lead fracture.    Physical Exam:   VS:  BP 110/60   Pulse 62   Ht 6' (1.829 m)   Wt 216 lb (98 kg)   SpO2 97%   BMI 29.29 kg/m    Wt Readings from Last 3 Encounters:  01/12/24 216 lb (98 kg)  12/30/23 207 lb 14.3 oz (94.3 kg)  12/23/23 210 lb 1.6 oz (95.3 kg)     GEN: No acute distress  NECK: No JVD; No carotid bruits CARDIAC: Regular rate and rhythm, no murmurs, rubs, gallops RESPIRATORY:  Clear to auscultation without rales, wheezing or rhonchi  ABDOMEN: Soft, non-tender, non-distended EXTREMITIES:  No edema; No deformity   ASSESSMENT  AND PLAN:    Chronic systolic CHF  s/p [name]  euvolemic today Stable on an appropriate medical regimen Normal ICD function Wound well healing See Pace Art report  CRT optimization Significant stim involving 1 or 4. Baseline: M3-RV coil PAV 170 / SAV 120 - QRS 220 ms M3-RV coil PAV 170 / SAV 170 - QRS 208 ms (Second choice if reprogramming needed) M3-RV coil PAV 200 / SAV 200 - QRS 200 ms, but deeper S wave in V1 M2-RV coil PAV/SAV 170 - QRS 204 ms M2-RV coil PAV/SAV 200 - QRS 206 ms    (1st choice with slightly better morphology)  H/o VT Continue amiodarone   Polysubstance abuse Encouraged abstinence   Disposition:   Follow up with EP APP as scheduled.    Signed, Tylene Galla, PA-C

## 2024-01-15 ENCOUNTER — Other Ambulatory Visit (HOSPITAL_COMMUNITY): Payer: Self-pay | Admitting: Family Medicine

## 2024-01-15 ENCOUNTER — Ambulatory Visit: Payer: Medicare Other

## 2024-01-15 ENCOUNTER — Ambulatory Visit (INDEPENDENT_AMBULATORY_CARE_PROVIDER_SITE_OTHER)

## 2024-01-15 DIAGNOSIS — I428 Other cardiomyopathies: Secondary | ICD-10-CM | POA: Diagnosis not present

## 2024-01-16 ENCOUNTER — Telehealth (HOSPITAL_COMMUNITY): Payer: Self-pay | Admitting: Cardiology

## 2024-01-16 NOTE — Telephone Encounter (Signed)
 Called to confirm/remind patient of their appointment at the [facility] on 01/16/24 .   Appointment:   [x] Confirmed  [] Left mess   [] No answer/No voice mail  [] VM Full/unable to leave message  [] Phone not in service  Patient reminded to bring all medications and/or complete list.  Confirmed patient has transportation. Gave directions, instructed to utilize valet parking.

## 2024-01-19 ENCOUNTER — Ambulatory Visit (HOSPITAL_COMMUNITY)
Admission: RE | Admit: 2024-01-19 | Discharge: 2024-01-19 | Disposition: A | Discharge status: Home / Self Care | Source: Ambulatory Visit | Attending: Cardiology | Admitting: Cardiology

## 2024-01-19 ENCOUNTER — Ambulatory Visit (HOSPITAL_COMMUNITY): Payer: Self-pay | Admitting: Cardiology

## 2024-01-19 DIAGNOSIS — I5022 Chronic systolic (congestive) heart failure: Secondary | ICD-10-CM | POA: Diagnosis present

## 2024-01-19 DIAGNOSIS — F1091 Alcohol use, unspecified, in remission: Secondary | ICD-10-CM | POA: Insufficient documentation

## 2024-01-19 DIAGNOSIS — I4729 Other ventricular tachycardia: Secondary | ICD-10-CM | POA: Diagnosis not present

## 2024-01-19 DIAGNOSIS — F1411 Cocaine abuse, in remission: Secondary | ICD-10-CM | POA: Diagnosis not present

## 2024-01-19 DIAGNOSIS — B0229 Other postherpetic nervous system involvement: Secondary | ICD-10-CM | POA: Diagnosis not present

## 2024-01-19 DIAGNOSIS — J449 Chronic obstructive pulmonary disease, unspecified: Secondary | ICD-10-CM | POA: Insufficient documentation

## 2024-01-19 DIAGNOSIS — Z79899 Other long term (current) drug therapy: Secondary | ICD-10-CM | POA: Diagnosis not present

## 2024-01-19 DIAGNOSIS — E1122 Type 2 diabetes mellitus with diabetic chronic kidney disease: Secondary | ICD-10-CM | POA: Diagnosis not present

## 2024-01-19 DIAGNOSIS — I428 Other cardiomyopathies: Secondary | ICD-10-CM | POA: Diagnosis not present

## 2024-01-19 DIAGNOSIS — N183 Chronic kidney disease, stage 3 unspecified: Secondary | ICD-10-CM | POA: Diagnosis not present

## 2024-01-19 DIAGNOSIS — I130 Hypertensive heart and chronic kidney disease with heart failure and stage 1 through stage 4 chronic kidney disease, or unspecified chronic kidney disease: Secondary | ICD-10-CM | POA: Insufficient documentation

## 2024-01-19 DIAGNOSIS — I2510 Atherosclerotic heart disease of native coronary artery without angina pectoris: Secondary | ICD-10-CM | POA: Diagnosis not present

## 2024-01-19 DIAGNOSIS — Z87891 Personal history of nicotine dependence: Secondary | ICD-10-CM | POA: Diagnosis not present

## 2024-01-19 DIAGNOSIS — Z7984 Long term (current) use of oral hypoglycemic drugs: Secondary | ICD-10-CM | POA: Insufficient documentation

## 2024-01-19 DIAGNOSIS — F1210 Cannabis abuse, uncomplicated: Secondary | ICD-10-CM | POA: Insufficient documentation

## 2024-01-19 DIAGNOSIS — I340 Nonrheumatic mitral (valve) insufficiency: Secondary | ICD-10-CM | POA: Insufficient documentation

## 2024-01-19 DIAGNOSIS — Z95810 Presence of automatic (implantable) cardiac defibrillator: Secondary | ICD-10-CM | POA: Insufficient documentation

## 2024-01-19 DIAGNOSIS — E119 Type 2 diabetes mellitus without complications: Secondary | ICD-10-CM | POA: Diagnosis present

## 2024-01-19 LAB — LIPID PANEL
Cholesterol: 125 mg/dL (ref 0–200)
HDL: 33 mg/dL — ABNORMAL LOW (ref >40)
LDL Cholesterol: 74 mg/dL (ref 0–99)
Total CHOL/HDL Ratio: 3.8 ratio
Triglycerides: 91 mg/dL (ref <150)
VLDL: 18 mg/dL (ref 0–40)

## 2024-01-19 LAB — COMPREHENSIVE METABOLIC PANEL WITH GFR
ALT: 18 U/L (ref 0–44)
AST: 24 U/L (ref 15–41)
Albumin: 3.2 g/dL — ABNORMAL LOW (ref 3.5–5.0)
Alkaline Phosphatase: 111 U/L (ref 38–126)
Anion gap: 7 (ref 5–15)
BUN: 21 mg/dL (ref 8–23)
CO2: 22 mmol/L (ref 22–32)
Calcium: 9 mg/dL (ref 8.9–10.3)
Chloride: 107 mmol/L (ref 98–111)
Creatinine, Ser: 1.63 mg/dL — ABNORMAL HIGH (ref 0.61–1.24)
GFR, Estimated: 45 mL/min — ABNORMAL LOW (ref >60)
Glucose, Bld: 71 mg/dL (ref 70–99)
Potassium: 4.1 mmol/L (ref 3.5–5.1)
Sodium: 136 mmol/L (ref 135–145)
Total Bilirubin: 0.7 mg/dL (ref 0.0–1.2)
Total Protein: 7.7 g/dL (ref 6.5–8.1)

## 2024-01-19 LAB — BRAIN NATRIURETIC PEPTIDE: B Natriuretic Peptide: 212.6 pg/mL — ABNORMAL HIGH (ref 0.0–100.0)

## 2024-01-19 LAB — TSH: TSH: 1.41 u[IU]/mL (ref 0.350–4.500)

## 2024-01-19 MED ORDER — TORSEMIDE 20 MG PO TABS
20.0000 mg | ORAL_TABLET | ORAL | 3 refills | Status: DC
Start: 2024-01-19 — End: 2024-06-09

## 2024-01-19 MED ORDER — SPIRONOLACTONE 25 MG PO TABS: 12.5000 mg | ORAL_TABLET | Freq: Every day | ORAL | 3 refills | Status: DC

## 2024-01-19 NOTE — Progress Notes (Signed)
 [facility] Note   Primary Care: Hershell Lose, NP HF Cardiologist: Dr. Mitzie Anda   Chief complaint: CHF  HPI: Edward Mullen is a 68 y.o. male with history of HTN, DM, chronic systolic CHF, and polysubstance abuse. Former heavy smoker. Quit 2024.   Admitted 08/23/16 with acute systolic CHF in setting of substance abuse. Echo was done, showing EF 15-20%.  Diuresed with IV lasix  and meds adjusted as tolerated.  UDS + for cocaine  on admission. Cardiac cath showed coronary disease but not significant enough to explain cardiomyopathy.   He was admitted with syncope in 5/18 after using cocaine .  He was noted to have NSVT on telemetry.  Syncope suspected due to VT, no ICD given active substance abuse but had Lifevest placed. EF 50% on 8/18 echo,, so Lifevest subsequently removed.  He started using cocaine  again and stopped his cardiac medications. In 4/22, he was admitted with acute on chronic systolic CHF.  Frequent NSVT was noted and amiodarone  was started.  He had echo showing EF 20-25% with normal RV.  LHC showed 80% PLV stenosis, managed medically.   Admitted 3/23 with a/c CHF and PNA. Echo with EF 20-25%, RV mildly reduced. Started on milrinone  and IV lasix . RHC showed mildly elevated PCWP and LVEDP, CI 1.9. Milrinone  weaned off and GDMT titrated. Had NSVT/VT, discharged home with LifeVest, weight 202 lbs.  Echo 8/23 showed EF 25-30%, global hypokinesis, moderate LV dilation, mild LVH, mildly decreased RV systolic function. Referred to EP for CRT-D consideration.  S/P St Jude CRT-D 12/23. .  Admitted 4/24 with OOH arrest, found unresponsive in his car. Required 8 mins CPR. Suspected cocaine  overdose.  UDS + for benzos, THC, and cocaine . Developed cardiogenic shock postcardiac arrest. Required intubation and pressors in CCU. + for rhinovirus during admission. Pressors weaned off. Discharged home on coreg , mexilitine, amiodarone , and torsemide . Losartan  and spironolactone  held  with hyperkalemia.   Admitted 7/24 with lightheadedness, hypotension and syncope. GDMT initially held but was all restarted at discharge. Suspected dehydration/hypovolemia. Had acute urinary retention and right-sided epididymitis, discharged with foley. Torsemide  was decreased from 20 mg daily to 10 mg 3x/week. Discharge weight 181 lbs.   Admitted 2/25 for VT successfully treated with ATP. UDS + cocaine  and THC. EP consulted, and amiodarone  increased to 200 bid x 10 days then back to 200 mg daily.   Patient returns for followup of CHF.  Weight is up about 7 lbs.  He has not been taking torsemide , this was stopped last time he was in the hospital.  He walks with a cane or rollator.  He denies exertional dyspnea though is not very active.  Says he has not used cocaine  x months though he still smokes marijuana.  No chest pain.  Main complaint is pain from post-herpetic neuralgia right flank.  No orthopnea/PND.  No palpitations.   ECG (personally reviewed): NSR, BiV pacing  Labs (5/25): K 4.5, creatinine 1.66  St Jude device interrogation (personally reviewed): Recent decrease in thoracic impedance, now starting to trend up; >99% BiV-P, 14% a-pacing, no AF/VT.   Social History: Lives in [place] with daughter. Prior heavy ETOH, now quit.  Quit smoking 10/20. Has quit using cocaine  per his report since 2/25.  Active marijuana.   Family History  Problem Relation Age of Onset   Stroke Mother 53   Heart disease Father 51   Colon cancer Neg Hx    Colon polyps Neg Hx    Esophageal cancer Neg Hx  Stomach cancer Neg Hx    Rectal cancer Neg Hx    Past Medical History: 1. HTN  2. Type II diabetes 3. Cocaine  abuse 4. COPD: Prior smoker. PFTs (1/25) with moderate airways obstruction.  5. Cardiomyopathy: Nonischemic. St Jude CRT-D device.  - Echo (1/18) with EF 15-20%, moderate LVH, moderate AI, moderate to severe MR, normal RV size with mildly decreased systolic function, PASP 44 mmHg.  -  LHC/RHC (1/18): 80% stenosis in PLV branch.  Mean RA 5, PA 47/20 mean 32, mean PCWP 22, CI 2.01.  - Echo (8/18): EF 50% with moderate LVH, diffuse hypokinesis, normal RV size and systolic function, mild AI, trivial MR.  - Echo (4/22): EF 20-25%, severe LV dilation, normal RV. - Echo (2/23):  EF 20-25%, RV mildly reduced.  - Echo (8/23): EF 25-30%, global hypokinesis, moderate LV dilation, mild LVH, mildly decreased RV systolic function.  6. CAD: LHC (1/18) with 80% stenosis in a branch of the PLV (not enough CAD to explain cardiomyopathy).  - LHC (4/22): PLV 80%, 25% mLAD.  - R/LHC (2/23): 1st RPL 80%, 30% mLAD; mildly elevated PCWP and LVEDP, normal RA pressures, CI 1.9 - Echo 10/23 EF 20-25% RV moderately reduced.  - Echo 4/24 EF25-30%, normal RV.  - Echo 7/24 EF <20%, GIDD, RV mildly reduced systolic function, mild MR.  7. Mitral regurgitation: Moderate to severe on 1/18 echo, probably functional.  Trivial MR on 8/18 echo.  8. CKD: Stage 3.  9. Syncope: 5/18 in setting of cocaine  use, concern for VT.  10. Lower extremity arterial dopplers (7/18): No significant PAD.  11. Sciatica 12. NSVT 13. CKD stage 3  Current Outpatient Medications  Medication Sig Dispense Refill   acetaminophen  (TYLENOL ) 500 MG tablet Take 1,000 mg by mouth every 6 (six) hours as needed for mild pain (pain score 1-3) or moderate pain (pain score 4-6).     albuterol  (VENTOLIN  HFA) 108 (90 Base) MCG/ACT inhaler INHALE 1 TO 2 PUFFS BY MOUTH EVERY 6 HOURS AS NEEDED FOR WHEEZING OR SHORTNESS OF BREATH (Patient taking differently: 2 puffs. 2 PUFFS BY MOUTH EVERY 6 HOURS AS NEEDED FOR WHEEZING OR SHORTNESS OF BREATH) 18 g 2   amiodarone  (PACERONE ) 200 MG tablet Take 1 tablet (200 mg total) by mouth 2 (two) times daily for 10 days. Then resume 200mg  once daily. (Patient taking differently: Take 200 mg by mouth daily.) 20 tablet 0   ASPIRIN  LOW DOSE 81 MG tablet TAKE 1 TABLET (81 MG TOTAL) BY MOUTH DAILY. SWALLOW WHOLE(AM) 30  tablet 11   carvedilol  (COREG ) 3.125 MG tablet Take 1 tablet (3.125 mg total) by mouth 2 (two) times daily. 60 tablet 11   Cholecalciferol (VITAMIN D3) 50 MCG (2000 UT) TABS Take 2,000 Units by mouth daily.     gabapentin  (NEURONTIN ) 600 MG tablet Take 600 mg by mouth 2 (two) times daily.     JARDIANCE  10 MG TABS tablet Take 10 mg by mouth daily.     losartan  (COZAAR ) 25 MG tablet Take 25 mg by mouth daily.     mexiletine (MEXITIL ) 150 MG capsule TAKE 1 CAPSULE BY MOUTH 2 (TWO) TIMES DAILY (AM+BEDTIME) 60 capsule 6   nortriptyline  (PAMELOR ) 25 MG capsule Take 25 mg by mouth at bedtime.     rosuvastatin  (CRESTOR ) 20 MG tablet Take 1 tablet (20 mg total) by mouth daily. 90 tablet 3   spironolactone  (ALDACTONE ) 25 MG tablet Take 0.5 tablets (12.5 mg total) by mouth daily. 45 tablet 3  STIOLTO RESPIMAT 2.5-2.5 MCG/ACT AERS Inhale 2 puffs into the lungs daily.     tamsulosin  (FLOMAX ) 0.4 MG CAPS capsule Take 0.4 mg by mouth at bedtime.     torsemide  (DEMADEX ) 20 MG tablet Take 1 tablet (20 mg total) by mouth every other day. 30 tablet 3   No current facility-administered medications for this encounter.   No Known Allergies  There were no vitals taken for this visit.  Wt Readings from Last 3 Encounters:  01/12/24 98 kg (216 lb)  12/30/23 94.3 kg (207 lb 14.3 oz)  12/23/23 95.3 kg (210 lb 1.6 oz)    PHYSICAL EXAM: General: NAD Neck: JVP 8 cm, no thyromegaly or thyroid  nodule.  Lungs: Occasional rhonchi CV: Nondisplaced PMI.  Heart regular S1/S2, no S3/S4, no murmur.  1+ ankle edema.  No carotid bruit.  Normal pedal pulses.  Abdomen: Soft, nontender, no hepatosplenomegaly, no distention.  Skin: Intact without lesions or rashes.  Neurologic: Alert and oriented x 3.  Psych: Normal affect. Extremities: No clubbing or cyanosis.  HEENT: Normal.   ASSESSMENT & PLAN: 1. Chronic systolic CHF:  EF 15-20% with moderate to severe MR on echo 1/18.  Nonischemic cardiomyopathy, has St Jude CRT-D  device. HTN vs cocaine  use vs viral vs ETOH. HIV negative, SPEP with no M-spike.  Patient has been noted to have CAD but not enough to explain cardiomyopathy (Cath 2/23 admission with stable 80% PLV stenosis).  Echo in 8/18 showed EF up to 50%. However, he stopped his meds and started cocaine  again, echo in 4/22 with EF back down to 20-25% => suspect cocaine  abuse may be the primary etiology of cardiomyopathy. Echo in 2/23 with EF 20-25%, RV mildly reduced. RHC (3/23) with mildly elevated PCWP and LVEDP, CI 1.9. Echo (8/23) showed EF 25-30%, global hypokinesis, moderate LV dilation, mild LVH, mildly decreased RV systolic function. Echo 10/23 EF 20-25% RV moderately reduced.  Echo 7/24 EF < 20%, GIDD, RV mildly reduced.  NYHA class II but not very active.  Weight is up, mild volume overload by exam and Corvue.  - Restart torsemide , take 20 mg every other day. BMET and BNP today, BMET in 10 days. - Start spironolactone  12.5 mg daily.  - Continue losartan  25 mg daily - Continue carvedilol  3.125 mg bid. - Continue Jardiance  10 mg daily.  - He is due for repeat echo, I will arrange.  2.VT/NSVT: Multiple runs during 3/23 admission, longest was 28 beats. Coronary angiography showed no change in coronaries. Has St Jude CRT-D. ATP on 09/30/23 - Continue amiodarone  200 mg daily. Check LFTs, TSH.  He will need a regular eye exam.   - Continue mexiletine 150 mg bid 3. CKD III: Baseline creatinine around 1.6.  - Continue Jardiance .  - BMET today.  4. CAD: 80% stenosis PLV on cath in 2/23.  No chest pain.  - Continue ASA + statin, check lipids today.  5. Polysubstance abuse: Last used cocaine  in 2/25 per his report.  6. COPD: PFTs 08/2023 with moderate airways obstruction => emphysema - Continue inhalers.  Follow up in 6 wks with APP.   I spent 32 minutes reviewing records, interviewing/examining patient, and managing orders.   Edward Mullen  01/19/2024

## 2024-01-19 NOTE — Patient Instructions (Signed)
 CHANGE Torsemide  to 20 mg every other day.  START Spironolactone  12.5 mg ( 1/2 Tab) daily.  Labs done today, your results will be available in MyChart, we will contact you for abnormal readings.  Repeat blood work in 10 days.  Your physician has requested that you have an echocardiogram. Echocardiography is a painless test that uses sound waves to create images of your heart. It provides your doctor with information about the size and shape of your heart and how well your heart's chambers and valves are working. This procedure takes approximately one hour. There are no restrictions for this procedure. Please do NOT wear cologne, perfume, aftershave, or lotions (deodorant is allowed). Please arrive 15 minutes prior to your appointment time.  Please note: We ask at that you not bring children with you during ultrasound (echo/ vascular) testing. Due to room size and safety concerns, children are not allowed in the ultrasound rooms during exams. Our front office staff cannot provide observation of children in our lobby area while testing is being conducted. An adult accompanying a patient to their appointment will only be allowed in the ultrasound room at the discretion of the ultrasound technician under special circumstances. We apologize for any inconvenience.  Your physician recommends that you schedule a follow-up appointment in: 6 weeks.  If you have any questions or concerns before your next appointment please send us  a message through [facility] or call our office at [phone].    TO LEAVE A MESSAGE FOR THE NURSE SELECT OPTION 2, PLEASE LEAVE A MESSAGE INCLUDING: YOUR NAME DATE OF BIRTH CALL BACK NUMBER REASON FOR CALL**this is important as we prioritize the call backs  YOU WILL RECEIVE A CALL BACK THE SAME DAY AS LONG AS YOU CALL BEFORE 4:00 PM  At the [facility], you and your health needs are our priority. As part of our continuing mission to provide you with  exceptional heart care, we have created designated Provider Care Teams. These Care Teams include your primary Cardiologist (physician) and Advanced Practice Providers (APPs- Physician Assistants and Nurse Practitioners) who all work together to provide you with the care you need, when you need it.   You may see any of the following providers on your designated Care Team at your next follow up: Dr Jules Oar Dr Peder Bourdon Dr. Alwin Baars Dr. Arta Lark Amy Marijane Shoulders, NP Ruddy Corral, [place] [facility] [place], [place] Dennise Fitz, NP [place] Lee, NP Shawnee Dellen, NP Luster Salters, PharmD Bevely Brush, PharmD   Please be sure to bring in all your medications bottles to every appointment.    Thank you for choosing [facility]

## 2024-01-20 LAB — CUP PACEART REMOTE DEVICE CHECK
Battery Remaining Longevity: 64 mo
Battery Remaining Percentage: 77 %
Battery Voltage: 2.99 V
Brady Statistic AP VP Percent: 6.4 %
Brady Statistic AP VS Percent: 1 %
Brady Statistic AS VP Percent: 93 %
Brady Statistic AS VS Percent: 1 %
Brady Statistic RA Percent Paced: 6.3 %
Date Time Interrogation Session: 20250605214225
HighPow Impedance: 56 Ohm
HighPow Impedance: 56 Ohm
Lead Channel Impedance Value: 390 Ohm
Lead Channel Impedance Value: 590 Ohm
Lead Channel Impedance Value: 600 Ohm
Lead Channel Pacing Threshold Amplitude: 0.5 V
Lead Channel Pacing Threshold Amplitude: 1 V
Lead Channel Pacing Threshold Amplitude: 1.5 V
Lead Channel Pacing Threshold Pulse Width: 0.5 ms
Lead Channel Pacing Threshold Pulse Width: 0.5 ms
Lead Channel Pacing Threshold Pulse Width: 1 ms
Lead Channel Sensing Intrinsic Amplitude: 12 mV
Lead Channel Sensing Intrinsic Amplitude: 5 mV
Lead Channel Setting Pacing Amplitude: 1.5 V
Lead Channel Setting Pacing Amplitude: 2 V
Lead Channel Setting Pacing Amplitude: 2.5 V
Lead Channel Setting Pacing Pulse Width: 0.5 ms
Lead Channel Setting Pacing Pulse Width: 1 ms
Lead Channel Setting Sensing Sensitivity: 0.5 mV
Pulse Gen Serial Number: 5556126
Zone Setting Status: 755011

## 2024-01-22 MED ORDER — ROSUVASTATIN CALCIUM 40 MG PO TABS: 40.0000 mg | ORAL_TABLET | Freq: Every day | ORAL | 6 refills | Status: AC

## 2024-01-23 ENCOUNTER — Ambulatory Visit: Payer: Self-pay | Admitting: Cardiology

## 2024-01-29 ENCOUNTER — Ambulatory Visit (HOSPITAL_COMMUNITY)
Admission: RE | Admit: 2024-01-29 | Discharge: 2024-01-29 | Disposition: A | Discharge status: Home / Self Care | Source: Ambulatory Visit | Attending: Cardiology | Admitting: Cardiology

## 2024-01-29 DIAGNOSIS — I5022 Chronic systolic (congestive) heart failure: Secondary | ICD-10-CM | POA: Insufficient documentation

## 2024-01-29 LAB — BASIC METABOLIC PANEL WITH GFR
Anion gap: 13 (ref 5–15)
BUN: 25 mg/dL — ABNORMAL HIGH (ref 8–23)
CO2: 17 mmol/L — ABNORMAL LOW (ref 22–32)
Calcium: 9.5 mg/dL (ref 8.9–10.3)
Chloride: 106 mmol/L (ref 98–111)
Creatinine, Ser: 1.59 mg/dL — ABNORMAL HIGH (ref 0.61–1.24)
GFR, Estimated: 47 mL/min — ABNORMAL LOW (ref >60)
Glucose, Bld: 76 mg/dL (ref 70–99)
Potassium: 5.3 mmol/L — ABNORMAL HIGH (ref 3.5–5.1)
Sodium: 136 mmol/L (ref 135–145)

## 2024-02-05 ENCOUNTER — Ambulatory Visit (HOSPITAL_COMMUNITY)
Admission: RE | Admit: 2024-02-05 | Discharge: 2024-02-05 | Disposition: A | Discharge status: Home / Self Care | Source: Ambulatory Visit | Attending: Internal Medicine | Admitting: Internal Medicine

## 2024-02-05 ENCOUNTER — Ambulatory Visit (HOSPITAL_COMMUNITY): Payer: Self-pay | Admitting: Cardiology

## 2024-02-05 DIAGNOSIS — I517 Cardiomegaly: Secondary | ICD-10-CM | POA: Insufficient documentation

## 2024-02-05 DIAGNOSIS — I447 Left bundle-branch block, unspecified: Secondary | ICD-10-CM | POA: Insufficient documentation

## 2024-02-05 DIAGNOSIS — I5022 Chronic systolic (congestive) heart failure: Secondary | ICD-10-CM

## 2024-02-05 DIAGNOSIS — I351 Nonrheumatic aortic (valve) insufficiency: Secondary | ICD-10-CM | POA: Insufficient documentation

## 2024-02-05 LAB — BASIC METABOLIC PANEL WITH GFR
Anion gap: 12 (ref 5–15)
BUN: 26 mg/dL — ABNORMAL HIGH (ref 8–23)
CO2: 25 mmol/L (ref 22–32)
Calcium: 9.7 mg/dL (ref 8.9–10.3)
Chloride: 102 mmol/L (ref 98–111)
Creatinine, Ser: 1.78 mg/dL — ABNORMAL HIGH (ref 0.61–1.24)
GFR, Estimated: 41 mL/min — ABNORMAL LOW (ref >60)
Glucose, Bld: 133 mg/dL — ABNORMAL HIGH (ref 70–99)
Potassium: 3.9 mmol/L (ref 3.5–5.1)
Sodium: 139 mmol/L (ref 135–145)

## 2024-02-05 LAB — ECHOCARDIOGRAM COMPLETE
Area-P 1/2: 2.9 cm2
S' Lateral: 4.7 cm

## 2024-02-16 ENCOUNTER — Encounter: Payer: Self-pay | Admitting: Cardiology

## 2024-02-27 ENCOUNTER — Telehealth (HOSPITAL_COMMUNITY): Payer: Self-pay

## 2024-02-27 NOTE — Telephone Encounter (Signed)
 Called to confirm/remind patient of their appointment at the [facility] on 03/01/24.   Appointment:   [x] Confirmed  [] Left mess   [] No answer/No voice mail  [] VM Full/unable to leave message  [] Phone not in service  Patient reminded to bring all medications and/or complete list.  Confirmed patient has transportation. Gave directions, instructed to utilize valet parking.

## 2024-03-01 ENCOUNTER — Encounter (HOSPITAL_COMMUNITY)

## 2024-03-01 NOTE — Progress Notes (Incomplete)
 Advanced [facility] Failure Clinic Note   Primary Care: Leontine Cramp, NP HF Cardiologist: Dr. Rolan   Chief complaint: CHF  HPI: Edward Mullen is a 68 y.o. male with history of HTN, DM, chronic systolic CHF, and polysubstance abuse. Former heavy smoker. Quit 2024.   Admitted 08/23/16 with acute systolic CHF in setting of substance abuse. Echo was done, showing EF 15-20%.  Diuresed with IV lasix  and meds adjusted as tolerated.  UDS + for cocaine  on admission. Cardiac cath showed coronary disease but not significant enough to explain cardiomyopathy.   He was admitted with syncope in 5/18 after using cocaine .  He was noted to have NSVT on telemetry.  Syncope suspected due to VT, no ICD given active substance abuse but had Lifevest placed. EF 50% on 8/18 echo,, so Lifevest subsequently removed.  He started using cocaine  again and stopped his cardiac medications. In 4/22, he was admitted with acute on chronic systolic CHF.  Frequent NSVT was noted and amiodarone  was started.  He had echo showing EF 20-25% with normal RV.  LHC showed 80% PLV stenosis, managed medically.   Admitted 3/23 with a/c CHF and PNA. Echo with EF 20-25%, RV mildly reduced. Started on milrinone  and IV lasix . RHC showed mildly elevated PCWP and LVEDP, CI 1.9. Milrinone  weaned off and GDMT titrated. Had NSVT/VT, discharged home with LifeVest, weight 202 lbs.  Echo 8/23 showed EF 25-30%, global hypokinesis, moderate LV dilation, mild LVH, mildly decreased RV systolic function. Referred to EP for CRT-D consideration.  S/P St Jude CRT-D 12/23. .  Admitted 4/24 with OOH arrest, found unresponsive in his car. Required 8 mins CPR. Suspected cocaine  overdose.  UDS + for benzos, THC, and cocaine . Developed cardiogenic shock postcardiac arrest. Required intubation and pressors in CCU. + for rhinovirus during admission. Pressors weaned off. Discharged home on coreg , mexilitine, amiodarone , and torsemide . Losartan  and spironolactone  held  with hyperkalemia.   Admitted 7/24 with lightheadedness, hypotension and syncope. GDMT initially held but was all restarted at discharge. Suspected dehydration/hypovolemia. Had acute urinary retention and right-sided epididymitis, discharged with foley. Torsemide  was decreased from 20 mg daily to 10 mg 3x/week. Discharge weight 181 lbs.   Admitted 2/25 for VT successfully treated with ATP. UDS + cocaine  and THC. EP consulted, and amiodarone  increased to 200 bid x 10 days then back to 200 mg daily.   Patient returns for followup of CHF.  Weight is up about 7 lbs.  He has not been taking torsemide , this was stopped last time he was in the hospital.  He walks with a cane or rollator.  He denies exertional dyspnea though is not very active.  Says he has not used cocaine  x months though he still smokes marijuana.  No chest pain.  Main complaint is pain from post-herpetic neuralgia right flank.  No orthopnea/PND.  No palpitations.   ECG (personally reviewed): NSR, BiV pacing  Labs (5/25): K 4.5, creatinine 1.66  St Jude device interrogation (personally reviewed): Recent decrease in thoracic impedance, now starting to trend up; >99% BiV-P, 14% a-pacing, no AF/VT.   Social History: Lives in [place] with daughter. Prior heavy ETOH, now quit.  Quit smoking 10/20. Has quit using cocaine  per his report since 2/25.  Active marijuana.   Family History  Problem Relation Age of Onset   Stroke Mother 72   Heart disease Father 16   Colon cancer Neg Hx    Colon polyps Neg Hx    Esophageal cancer Neg Hx  Stomach cancer Neg Hx    Rectal cancer Neg Hx    Past Medical History: 1. HTN  2. Type II diabetes 3. Cocaine  abuse 4. COPD: Prior smoker. PFTs (1/25) with moderate airways obstruction.  5. Cardiomyopathy: Nonischemic. St Jude CRT-D device.  - Echo (1/18) with EF 15-20%, moderate LVH, moderate AI, moderate to severe MR, normal RV size with mildly decreased systolic function, PASP 44 mmHg.  -  LHC/RHC (1/18): 80% stenosis in PLV branch.  Mean RA 5, PA 47/20 mean 32, mean PCWP 22, CI 2.01.  - Echo (8/18): EF 50% with moderate LVH, diffuse hypokinesis, normal RV size and systolic function, mild AI, trivial MR.  - Echo (4/22): EF 20-25%, severe LV dilation, normal RV. - Echo (2/23):  EF 20-25%, RV mildly reduced.  - Echo (8/23): EF 25-30%, global hypokinesis, moderate LV dilation, mild LVH, mildly decreased RV systolic function.  6. CAD: LHC (1/18) with 80% stenosis in a branch of the PLV (not enough CAD to explain cardiomyopathy).  - LHC (4/22): PLV 80%, 25% mLAD.  - R/LHC (2/23): 1st RPL 80%, 30% mLAD; mildly elevated PCWP and LVEDP, normal RA pressures, CI 1.9 - Echo 10/23 EF 20-25% RV moderately reduced.  - Echo 4/24 EF25-30%, normal RV.  - Echo 7/24 EF <20%, GIDD, RV mildly reduced systolic function, mild MR.  7. Mitral regurgitation: Moderate to severe on 1/18 echo, probably functional.  Trivial MR on 8/18 echo.  8. CKD: Stage 3.  9. Syncope: 5/18 in setting of cocaine  use, concern for VT.  10. Lower extremity arterial dopplers (7/18): No significant PAD.  11. Sciatica 12. NSVT 13. CKD stage 3  Current Outpatient Medications  Medication Sig Dispense Refill   acetaminophen  (TYLENOL ) 500 MG tablet Take 1,000 mg by mouth every 6 (six) hours as needed for mild pain (pain score 1-3) or moderate pain (pain score 4-6).     albuterol  (VENTOLIN  HFA) 108 (90 Base) MCG/ACT inhaler INHALE 1 TO 2 PUFFS BY MOUTH EVERY 6 HOURS AS NEEDED FOR WHEEZING OR SHORTNESS OF BREATH (Patient taking differently: 2 puffs. 2 PUFFS BY MOUTH EVERY 6 HOURS AS NEEDED FOR WHEEZING OR SHORTNESS OF BREATH) 18 g 2   amiodarone  (PACERONE ) 200 MG tablet Take 1 tablet (200 mg total) by mouth 2 (two) times daily for 10 days. Then resume 200mg  once daily. (Patient taking differently: Take 200 mg by mouth daily.) 20 tablet 0   ASPIRIN  LOW DOSE 81 MG tablet TAKE 1 TABLET (81 MG TOTAL) BY MOUTH DAILY. SWALLOW WHOLE(AM) 30  tablet 11   carvedilol  (COREG ) 3.125 MG tablet Take 1 tablet (3.125 mg total) by mouth 2 (two) times daily. 60 tablet 11   Cholecalciferol (VITAMIN D3) 50 MCG (2000 UT) TABS Take 2,000 Units by mouth daily.     gabapentin  (NEURONTIN ) 600 MG tablet Take 600 mg by mouth 2 (two) times daily.     JARDIANCE  10 MG TABS tablet Take 10 mg by mouth daily.     losartan  (COZAAR ) 25 MG tablet Take 25 mg by mouth daily.     mexiletine (MEXITIL ) 150 MG capsule TAKE 1 CAPSULE BY MOUTH 2 (TWO) TIMES DAILY (AM+BEDTIME) 60 capsule 6   nortriptyline  (PAMELOR ) 25 MG capsule Take 25 mg by mouth at bedtime.     rosuvastatin  (CRESTOR ) 40 MG tablet Take 1 tablet (40 mg total) by mouth daily. 30 tablet 6   spironolactone  (ALDACTONE ) 25 MG tablet Take 0.5 tablets (12.5 mg total) by mouth daily. 45 tablet 3  STIOLTO RESPIMAT 2.5-2.5 MCG/ACT AERS Inhale 2 puffs into the lungs daily.     tamsulosin  (FLOMAX ) 0.4 MG CAPS capsule Take 0.4 mg by mouth at bedtime.     torsemide  (DEMADEX ) 20 MG tablet Take 1 tablet (20 mg total) by mouth every other day. 30 tablet 3   No current facility-administered medications for this visit.   No Known Allergies  There were no vitals taken for this visit.  Wt Readings from Last 3 Encounters:  01/12/24 98 kg (216 lb)  12/30/23 94.3 kg (207 lb 14.3 oz)  12/23/23 95.3 kg (210 lb 1.6 oz)    PHYSICAL EXAM: General: NAD Neck: JVP 8 cm, no thyromegaly or thyroid  nodule.  Lungs: Occasional rhonchi CV: Nondisplaced PMI.  Heart regular S1/S2, no S3/S4, no murmur.  1+ ankle edema.  No carotid bruit.  Normal pedal pulses.  Abdomen: Soft, nontender, no hepatosplenomegaly, no distention.  Skin: Intact without lesions or rashes.  Neurologic: Alert and oriented x 3.  Psych: Normal affect. Extremities: No clubbing or cyanosis.  HEENT: Normal.   ASSESSMENT & PLAN: 1. Chronic systolic CHF:  EF 15-20% with moderate to severe MR on echo 1/18.  Nonischemic cardiomyopathy, has St Jude CRT-D device.  HTN vs cocaine  use vs viral vs ETOH. HIV negative, SPEP with no M-spike.  Patient has been noted to have CAD but not enough to explain cardiomyopathy (Cath 2/23 admission with stable 80% PLV stenosis).  Echo in 8/18 showed EF up to 50%. However, he stopped his meds and started cocaine  again, echo in 4/22 with EF back down to 20-25% => suspect cocaine  abuse may be the primary etiology of cardiomyopathy. Echo in 2/23 with EF 20-25%, RV mildly reduced. RHC (3/23) with mildly elevated PCWP and LVEDP, CI 1.9. Echo (8/23) showed EF 25-30%, global hypokinesis, moderate LV dilation, mild LVH, mildly decreased RV systolic function. Echo 10/23 EF 20-25% RV moderately reduced.  Echo 7/24 EF < 20%, GIDD, RV mildly reduced.  NYHA class II but not very active.  Weight is up, mild volume overload by exam and Corvue.  - Restart torsemide , take 20 mg every other day. BMET and BNP today, BMET in 10 days. - Start spironolactone  12.5 mg daily.  - Continue losartan  25 mg daily - Continue carvedilol  3.125 mg bid. - Continue Jardiance  10 mg daily.  - He is due for repeat echo, I will arrange.  2.VT/NSVT: Multiple runs during 3/23 admission, longest was 28 beats. Coronary angiography showed no change in coronaries. Has St Jude CRT-D. ATP on 09/30/23 - Continue amiodarone  200 mg daily. Check LFTs, TSH.  He will need a regular eye exam.   - Continue mexiletine 150 mg bid 3. CKD III: Baseline creatinine around 1.6.  - Continue Jardiance .  - BMET today.  4. CAD: 80% stenosis PLV on cath in 2/23.  No chest pain.  - Continue ASA + statin, check lipids today.  5. Polysubstance abuse: Last used cocaine  in 2/25 per his report.  6. COPD: PFTs 08/2023 with moderate airways obstruction => emphysema - Continue inhalers.  Follow up in 6 wks with APP.   I spent 32 minutes reviewing records, interviewing/examining patient, and managing orders.   Harlene HERO [facility]  03/01/2024

## 2024-03-04 NOTE — Progress Notes (Signed)
 Remote ICD transmission.

## 2024-03-04 NOTE — Addendum Note (Signed)
 Addended by: VICCI SELLER A on: 03/04/2024 11:54 AM   Modules accepted: Orders

## 2024-03-05 NOTE — Progress Notes (Signed)
 Advanced Heart Failure Clinic Note   Primary Care: Leontine Cramp, NP HF Cardiologist: Dr. Rolan   Chief complaint: CHF  HPI: Edward Mullen is a 68 y.o. male with history of HTN, DM, chronic systolic CHF, and polysubstance abuse. Former heavy smoker. Quit 2024.   Admitted 08/23/16 with acute systolic CHF in setting of substance abuse. Echo was done, showing EF 15-20%.  Diuresed with IV lasix  and meds adjusted as tolerated.  UDS + for cocaine  on admission. Cardiac cath showed coronary disease but not significant enough to explain cardiomyopathy.   He was admitted with syncope in 5/18 after using cocaine .  He was noted to have NSVT on telemetry.  Syncope suspected due to VT, no ICD given active substance abuse but had Lifevest placed. EF 50% on 8/18 echo,, so Lifevest subsequently removed.  He started using cocaine  again and stopped his cardiac medications. In 4/22, he was admitted with acute on chronic systolic CHF.  Frequent NSVT was noted and amiodarone  was started.  He had echo showing EF 20-25% with normal RV.  LHC showed 80% PLV stenosis, managed medically.   Admitted 3/23 with a/c CHF and PNA. Echo with EF 20-25%, RV mildly reduced. Started on milrinone  and IV lasix . RHC showed mildly elevated PCWP and LVEDP, CI 1.9. Milrinone  weaned off and GDMT titrated. Had NSVT/VT, discharged home with LifeVest, weight 202 lbs.  Echo 8/23 showed EF 25-30%, global hypokinesis, moderate LV dilation, mild LVH, mildly decreased RV systolic function. Referred to EP for CRT-D consideration.  S/P St Jude CRT-D 12/23. .  Admitted 4/24 with OOH arrest, found unresponsive in his car. Required 8 mins CPR. Suspected cocaine  overdose.  UDS + for benzos, THC, and cocaine . Developed cardiogenic shock postcardiac arrest. Required intubation and pressors in CCU. + for rhinovirus during admission. Pressors weaned off. Discharged home on coreg , mexilitine, amiodarone , and torsemide . Losartan  and spironolactone  held  with hyperkalemia.   Admitted 7/24 with lightheadedness, hypotension and syncope. GDMT initially held but was all restarted at discharge. Suspected dehydration/hypovolemia. Had acute urinary retention and right-sided epididymitis, discharged with foley. Torsemide  was decreased from 20 mg daily to 10 mg 3x/week. Discharge weight 181 lbs.   Admitted 2/25 for VT successfully treated with ATP. UDS + cocaine  and THC. EP consulted, and amiodarone  increased to 200 bid x 10 days then back to 200 mg daily.   He is here today for CHF follow-up. His weight is stable from last visit and thoracic is at threshold. Denies shortness of breath, orthopnea, PND or lower extremity edema. Weight has been stable. He ambulates with a cane or rollator. He is not very active. He attempted to mow the lawn this past week but got lightheaded in the heat and had to go inside. Lives with his daughter and her children. He does not use cocaine , tobacco or ETOH. Smokes marijuana on occasion.    Family History  Problem Relation Age of Onset   Stroke Mother 30   Heart disease Father 50   Colon cancer Neg Hx    Colon polyps Neg Hx    Esophageal cancer Neg Hx    Stomach cancer Neg Hx    Rectal cancer Neg Hx    Past Medical History: 1. HTN  2. Type II diabetes 3. Cocaine  abuse 4. COPD: Prior smoker. PFTs (1/25) with moderate airways obstruction.  5. Cardiomyopathy: Nonischemic. St Jude CRT-D device.  - Echo (1/18) with EF 15-20%, moderate LVH, moderate AI, moderate to severe MR, normal RV size with mildly  decreased systolic function, PASP 44 mmHg.  - LHC/RHC (1/18): 80% stenosis in PLV branch.  Mean RA 5, PA 47/20 mean 32, mean PCWP 22, CI 2.01.  - Echo (8/18): EF 50% with moderate LVH, diffuse hypokinesis, normal RV size and systolic function, mild AI, trivial MR.  - Echo (4/22): EF 20-25%, severe LV dilation, normal RV. - Echo (2/23):  EF 20-25%, RV mildly reduced.  - Echo (8/23): EF 25-30%, global hypokinesis, moderate  LV dilation, mild LVH, mildly decreased RV systolic function.  - Echo 4/24 EF25-30%, normal RV.  - Echo 7/24 EF <20%, GIDD, RV mildly reduced systolic function, mild MR.  - Echo 06/25: EF 30-35%, septal dyssynchrony, RV okay 6. CAD: LHC (1/18) with 80% stenosis in a branch of the PLV (not enough CAD to explain cardiomyopathy).  - LHC (4/22): PLV 80%, 25% mLAD.  - R/LHC (2/23): 1st RPL 80%, 30% mLAD; mildly elevated PCWP and LVEDP, normal RA pressures, CI 1.9 7. Mitral regurgitation: Moderate to severe on 1/18 echo, probably functional.  Trivial MR on 8/18 echo.  8. CKD: Stage 3.  9. Syncope: 5/18 in setting of cocaine  use, concern for VT.  10. Lower extremity arterial dopplers (7/18): No significant PAD.  11. Sciatica 12. NSVT 13. CKD stage 3  Current Outpatient Medications  Medication Sig Dispense Refill   acetaminophen  (TYLENOL ) 500 MG tablet Take 1,000 mg by mouth every 6 (six) hours as needed for mild pain (pain score 1-3) or moderate pain (pain score 4-6).     albuterol  (VENTOLIN  HFA) 108 (90 Base) MCG/ACT inhaler INHALE 1 TO 2 PUFFS BY MOUTH EVERY 6 HOURS AS NEEDED FOR WHEEZING OR SHORTNESS OF BREATH 18 g 2   amiodarone  (PACERONE ) 200 MG tablet Take 1 tablet (200 mg total) by mouth 2 (two) times daily for 10 days. Then resume 200mg  once daily. 20 tablet 0   ASPIRIN  LOW DOSE 81 MG tablet TAKE 1 TABLET (81 MG TOTAL) BY MOUTH DAILY. SWALLOW WHOLE(AM) 30 tablet 11   carvedilol  (COREG ) 3.125 MG tablet Take 1 tablet (3.125 mg total) by mouth 2 (two) times daily. 60 tablet 11   Cholecalciferol (VITAMIN D3) 50 MCG (2000 UT) TABS Take 2,000 Units by mouth daily.     gabapentin  (NEURONTIN ) 600 MG tablet Take 600 mg by mouth 2 (two) times daily.     JARDIANCE  10 MG TABS tablet Take 10 mg by mouth daily.     losartan  (COZAAR ) 25 MG tablet Take 25 mg by mouth daily.     mexiletine (MEXITIL ) 150 MG capsule TAKE 1 CAPSULE BY MOUTH 2 (TWO) TIMES DAILY (AM+BEDTIME) 60 capsule 6   nortriptyline   (PAMELOR ) 25 MG capsule Take 25 mg by mouth at bedtime.     rosuvastatin  (CRESTOR ) 40 MG tablet Take 1 tablet (40 mg total) by mouth daily. 30 tablet 6   spironolactone  (ALDACTONE ) 25 MG tablet Take 0.5 tablets (12.5 mg total) by mouth daily. 45 tablet 3   STIOLTO RESPIMAT 2.5-2.5 MCG/ACT AERS Inhale 2 puffs into the lungs daily.     tamsulosin  (FLOMAX ) 0.4 MG CAPS capsule Take 0.4 mg by mouth at bedtime.     torsemide  (DEMADEX ) 20 MG tablet Take 1 tablet (20 mg total) by mouth every other day. 30 tablet 3   No current facility-administered medications for this encounter.   No Known Allergies  BP 130/80   Pulse 70   Wt 98.2 kg (216 lb 9.6 oz)   SpO2 97%   BMI 29.38 kg/m  Wt Readings from Last 3 Encounters:  03/09/24 98.2 kg (216 lb 9.6 oz)  01/12/24 98 kg (216 lb)  12/30/23 94.3 kg (207 lb 14.3 oz)    PHYSICAL EXAM: General:  Chronically ill appearing Cor: No JVD. Regular rate & rhythm. No murmurs. Lungs: clear Abdomen: soft, nontender, nondistended.  Extremities: trace edema Neuro: alert & orientedx3. Affect pleasant   St Jude device interrogation (personally reviewed): Thoracic impedance at threshold, 99% BiV paced, no AT/AF, 2 nonsustained VT episode.  ASSESSMENT & PLAN: 1. Chronic systolic CHF:  EF 15-20% with moderate to severe MR on echo 1/18.  Nonischemic cardiomyopathy, has St Jude CRT-D device. HTN vs cocaine  use vs viral vs ETOH. HIV negative, SPEP with no M-spike.  Patient has been noted to have CAD but not enough to explain cardiomyopathy (Cath 2/23 admission with stable 80% PLV stenosis).  Echo in 8/18 showed EF up to 50%. However, he stopped his meds and started cocaine  again, echo in 4/22 with EF back down to 20-25% => suspect cocaine  abuse may be the primary etiology of cardiomyopathy. Echo in 2/23 with EF 20-25%, RV mildly reduced. RHC (3/23) with mildly elevated PCWP and LVEDP, CI 1.9. Echo (8/23) showed EF 25-30%, global hypokinesis, moderate LV dilation, mild  LVH, mildly decreased RV systolic function. Echo 10/23 EF 20-25% RV moderately reduced.  Echo 7/24 EF < 20%, GIDD, RV mildly reduced.  Echo 06/25: EF 30-35%, septal dyssynchrony d/t LBBB.  - NYHA II but not very active. Volume okay on exam and by CorVue. Continue Torsemide  20 mg every other day.  - Increase spironolactone  to 25 mg daily - Continue losartan  25 mg daily. Previously intolerant to Entresto  d/t orthostasis, prior syncopal episodes.  - Continue carvedilol  3.125 mg bid. - Continue Jardiance  10 mg daily.  - BMET/BNP today, repeat BMET 1 week 2.VT/NSVT: Multiple runs during 3/23 admission, longest was 28 beats. Coronary angiography showed no change in coronaries. Has St Jude CRT-D. ATP on 09/30/23. No episodes on device interrogation today - Continue amiodarone  200 mg daily (listed as BID on med list, sent message to his pharmacy regarding bubble packs). LFTs and TSH stable in 06/25.  He will need a regular eye exam.   - Continue mexiletine 150 mg bid 3. CKD III: Baseline creatinine around 1.6.  - Continue Jardiance  - BMET today 4. CAD: 80% stenosis PLV on cath in 2/23.  No chest pain.  - Continue ASA + statin 5. Polysubstance abuse: No recent cocaine , ETOH or tobacco use per patient. 6. COPD: PFTs 08/2023 with moderate airways obstruction => emphysema - Continue inhalers.  Follow up 4 months with APP   [facility], [place]onta Phariss N  03/09/2024

## 2024-03-08 ENCOUNTER — Telehealth (HOSPITAL_COMMUNITY): Payer: Self-pay

## 2024-03-08 NOTE — Telephone Encounter (Signed)
 Called to confirm/remind patient of their appointment at the Advanced Heart Failure Clinic on 03/09/2024 10:00.   Appointment:   [x] Confirmed  [] Left mess   [] No answer/No voice mail  [] VM Full/unable to leave message  [] Phone not in service  Patient reminded to bring all medications and/or complete list.  Confirmed patient has transportation. Gave directions, instructed to utilize valet parking.

## 2024-03-09 ENCOUNTER — Ambulatory Visit (HOSPITAL_COMMUNITY)
Admission: RE | Admit: 2024-03-09 | Discharge: 2024-03-09 | Disposition: A | Discharge status: Home / Self Care | Source: Ambulatory Visit | Attending: Physician Assistant

## 2024-03-09 VITALS — BP 130/80 | HR 70 | Wt 216.6 lb

## 2024-03-09 DIAGNOSIS — I5022 Chronic systolic (congestive) heart failure: Secondary | ICD-10-CM | POA: Diagnosis not present

## 2024-03-09 DIAGNOSIS — I472 Ventricular tachycardia, unspecified: Secondary | ICD-10-CM | POA: Insufficient documentation

## 2024-03-09 DIAGNOSIS — I130 Hypertensive heart and chronic kidney disease with heart failure and stage 1 through stage 4 chronic kidney disease, or unspecified chronic kidney disease: Secondary | ICD-10-CM | POA: Insufficient documentation

## 2024-03-09 DIAGNOSIS — I2510 Atherosclerotic heart disease of native coronary artery without angina pectoris: Secondary | ICD-10-CM | POA: Insufficient documentation

## 2024-03-09 DIAGNOSIS — I428 Other cardiomyopathies: Secondary | ICD-10-CM | POA: Diagnosis not present

## 2024-03-09 DIAGNOSIS — F1910 Other psychoactive substance abuse, uncomplicated: Secondary | ICD-10-CM | POA: Insufficient documentation

## 2024-03-09 DIAGNOSIS — Z79899 Other long term (current) drug therapy: Secondary | ICD-10-CM | POA: Insufficient documentation

## 2024-03-09 DIAGNOSIS — Z87891 Personal history of nicotine dependence: Secondary | ICD-10-CM | POA: Diagnosis not present

## 2024-03-09 DIAGNOSIS — I447 Left bundle-branch block, unspecified: Secondary | ICD-10-CM | POA: Insufficient documentation

## 2024-03-09 DIAGNOSIS — J439 Emphysema, unspecified: Secondary | ICD-10-CM | POA: Diagnosis not present

## 2024-03-09 DIAGNOSIS — Z7982 Long term (current) use of aspirin: Secondary | ICD-10-CM | POA: Insufficient documentation

## 2024-03-09 DIAGNOSIS — N1831 Chronic kidney disease, stage 3a: Secondary | ICD-10-CM

## 2024-03-09 DIAGNOSIS — E1122 Type 2 diabetes mellitus with diabetic chronic kidney disease: Secondary | ICD-10-CM | POA: Diagnosis not present

## 2024-03-09 DIAGNOSIS — Z7984 Long term (current) use of oral hypoglycemic drugs: Secondary | ICD-10-CM | POA: Insufficient documentation

## 2024-03-09 DIAGNOSIS — I429 Cardiomyopathy, unspecified: Secondary | ICD-10-CM | POA: Insufficient documentation

## 2024-03-09 LAB — BASIC METABOLIC PANEL WITH GFR
Anion gap: 9 (ref 5–15)
BUN: 18 mg/dL (ref 8–23)
CO2: 25 mmol/L (ref 22–32)
Calcium: 9.3 mg/dL (ref 8.9–10.3)
Chloride: 104 mmol/L (ref 98–111)
Creatinine, Ser: 1.53 mg/dL — ABNORMAL HIGH (ref 0.61–1.24)
GFR, Estimated: 49 mL/min — ABNORMAL LOW (ref >60)
Glucose, Bld: 88 mg/dL (ref 70–99)
Potassium: 3.6 mmol/L (ref 3.5–5.1)
Sodium: 138 mmol/L (ref 135–145)

## 2024-03-09 LAB — BRAIN NATRIURETIC PEPTIDE: B Natriuretic Peptide: 268.9 pg/mL — ABNORMAL HIGH (ref 0.0–100.0)

## 2024-03-09 MED ORDER — AMIODARONE HCL 200 MG PO TABS: 200.0000 mg | ORAL_TABLET | Freq: Every day | ORAL | 3 refills | Status: AC

## 2024-03-09 MED ORDER — SPIRONOLACTONE 25 MG PO TABS: 25.0000 mg | ORAL_TABLET | Freq: Every day | ORAL | 3 refills | Status: AC

## 2024-03-09 NOTE — Patient Instructions (Signed)
 CHANGE Amiodarone  to 200 mg daily ( 1 Tab) daily   INCREASE Spironolactone  to 25 mg ( 1 tab) daily.  Labs done today, your results will be available in MyChart, we will contact you for abnormal readings.  Repeat blood work in 10 days.  Your physician recommends that you schedule a follow-up appointment in: 4 months.  If you have any questions or concerns before your next appointment please send us  a message through [place] or call our office at [phone].    TO LEAVE A MESSAGE FOR THE NURSE SELECT OPTION 2, PLEASE LEAVE A MESSAGE INCLUDING: YOUR NAME DATE OF BIRTH CALL BACK NUMBER REASON FOR CALL**this is important as we prioritize the call backs  YOU WILL RECEIVE A CALL BACK THE SAME DAY AS LONG AS YOU CALL BEFORE 4:00 PM  At the Advanced Heart Failure Clinic, you and your health needs are our priority. As part of our continuing mission to provide you with exceptional heart care, we have created designated Provider Care Teams. These Care Teams include your primary Cardiologist (physician) and Advanced Practice Providers (APPs- Physician Assistants and Nurse Practitioners) who all work together to provide you with the care you need, when you need it.   You may see any of the following providers on your designated Care Team at your next follow up: Dr Toribio Fuel Dr Ezra Shuck Dr. Ria Commander Dr. Morene Brownie Amy Lenetta, NP Caffie Shed, [place] [facility], [place] Beckey Coe, NP [place] Lee, NP Ellouise Class, NP Tinnie Redman, PharmD Jaun Bash, PharmD   Please be sure to bring in all your medications bottles to every appointment.    Thank you for choosing [facility]

## 2024-03-10 ENCOUNTER — Ambulatory Visit (HOSPITAL_COMMUNITY): Payer: Self-pay | Admitting: Physician Assistant

## 2024-03-19 ENCOUNTER — Ambulatory Visit (HOSPITAL_COMMUNITY)
Admission: RE | Admit: 2024-03-19 | Discharge: 2024-03-19 | Disposition: A | Discharge status: Home / Self Care | Source: Ambulatory Visit | Attending: Cardiology | Admitting: Cardiology

## 2024-03-19 DIAGNOSIS — I5022 Chronic systolic (congestive) heart failure: Secondary | ICD-10-CM | POA: Insufficient documentation

## 2024-03-19 LAB — BASIC METABOLIC PANEL WITH GFR
Anion gap: 12 (ref 5–15)
BUN: 23 mg/dL (ref 8–23)
CO2: 27 mmol/L (ref 22–32)
Calcium: 9.8 mg/dL (ref 8.9–10.3)
Chloride: 101 mmol/L (ref 98–111)
Creatinine, Ser: 1.77 mg/dL — ABNORMAL HIGH (ref 0.61–1.24)
GFR, Estimated: 41 mL/min — ABNORMAL LOW (ref >60)
Glucose, Bld: 105 mg/dL — ABNORMAL HIGH (ref 70–99)
Potassium: 3.8 mmol/L (ref 3.5–5.1)
Sodium: 140 mmol/L (ref 135–145)

## 2024-03-21 NOTE — Progress Notes (Deleted)
  Cardiology Office Note:  .   Date:  03/21/2024  ID:  Edward Mullen [name], DOB August 15, 1955, MRN [id number] PCP: Leontine Cramp, NP  [facility] Providers Cardiologist:  None Electrophysiologist:  Elspeth Sage, MD >> Dr. Kennyth  Advanced Heart Failure:  Edward Shuck, MD {  History of Present Illness: .   Edward Mullen is a 68 y.o. male w/PMHx of  HTN, DM, CKD (III), COPD substance abuse (cocaine ) CAD (non-obstructive by cath 2018 > 80%PLV stenosis managed medically by cath 2022, 2023) NICM Chronic CHF ICD VT  OOH arrest, found unresponsive in his car. Required 8 mins CPR. Suspected cocaine  overdose. UDS + for benzos, THC, and cocaine    2/25 for VT successfully treated with ATP. UDS + cocaine  and THC   ICD RV lead fracture > lead explanted and new lead placed May 2025  Saw AHF team 03/09/24, living with his daughter, sedentary ambulating with a cane, rollator, + THC no drugs otherwise No changes were made.   Today's visit is scheduled as his 88 post op visit ROS:   *** site *** chronic output *** VT *** symptoms, volume, CurVue   Device information SJM CRT-D implanted 07/17/22, > RV lead fracture > explanted, new lead implant 12/22/23  AAD hx Amiodarone , mexiletine started April 2024   Studies Reviewed: SABRA    EKG not done today  DEVICE interrogation done today and reviewed by myself *** Battery and lead measurements are good ***    Echo 4/24 EF25-30%, normal RV.  - Echo 7/24 EF <20%, GIDD, RV mildly reduced systolic function, mild MR.  - Echo 06/25: EF 30-35%, septal dyssynchrony, RV okay   LHC (1/18) with 80% stenosis in a branch of the PLV (not enough CAD to explain cardiomyopathy).  - LHC (4/22): PLV 80%, 25% mLAD.  - R/LHC (2/23): 1st RPL 80%, 30% mLAD; mildly elevated PCWP and LVEDP, normal RA pressures, CI 1.9   Risk Assessment/Calculations:    Physical Exam:   VS:  There were no vitals taken for this visit.   Wt Readings from Last 3  Encounters:  03/09/24 216 lb 9.6 oz (98.2 kg)  01/12/24 216 lb (98 kg)  12/30/23 207 lb 14.3 oz (94.3 kg)    GEN: Well nourished, well developed in no acute distress NECK: No JVD; No carotid bruits CARDIAC: ***RRR, no murmurs, rubs, gallops RESPIRATORY:  *** CTA b/l without rales, wheezing or rhonchi  ABDOMEN: Soft, non-tender, non-distended EXTREMITIES: *** No edema; No deformity   ICD site: *** is stable, no thinning, fluctuation, tethering  ASSESSMENT AND PLAN: .    CRT-D *** intact function *** no programming changes made  NICM Chronic CHF Paced QRS RBBB morphology (post lead replacement) ***  C/w Dr. Marciano    {Are you ordering a CV Procedure (e.g. stress test, cath, DCCV, TEE, etc)?   Press F2        :[id number]}     Dispo: ***  Signed, Edward Macario Arthur, PA-C

## 2024-03-22 ENCOUNTER — Ambulatory Visit: Admitting: Physician Assistant

## 2024-03-22 ENCOUNTER — Encounter

## 2024-04-07 NOTE — Progress Notes (Unsigned)
 Electrophysiology Office Note:   Date:  04/08/2024  ID:  [name], DOB March 14, 1956, MRN [id number]  Primary Cardiologist: None Primary Heart Failure: Ezra Shuck, MD Electrophysiologist: OLE ONEIDA HOLTS, MD       History of Present Illness:   Edward Mullen is a 68 y.o. male with h/o HFrEF, NICM, VT s/p ICD, non-obs CAD by [facility] 2018, DM, CKD III, COPD & substance abuse seen today for routine electrophysiology follow-up s/p lead extraction (lead fracture) and implant.  Hx of OOH arrest, found unresponsive in his car. Required 8 mins CPR. Suspected cocaine  overdose. UDS + for benzos, THC, and cocaine . In 2/25 for VT successfully treated with ATP. UDS + cocaine  and THC.  ICD RV lead fracture > lead explanted and new lead placed May 2025  Saw AHF team 03/09/24, living with his daughter, sedentary ambulating with a cane, rollator, + THC no drugs otherwise. No changes were made.  Since last being seen in our clinic the patient reports doing well overall. He is glad his lead issues were fixed. He is looking forward to winter hoping for snow for his 55 year old grandchildren.    He denies chest pain, palpitations, dyspnea, PND, orthopnea, nausea, vomiting, dizziness, syncope, edema, weight gain, or early satiety.    Review of systems complete and found to be negative unless listed in HPI.    EP Information / Studies Reviewed:    EKG is not ordered today. EKG from 01/19/24 reviewed which showed ASVP 69 bpm      ICD Interrogation-  reviewed in detail today,  See PACEART report.  Device History: Abbott BiV ICD implanted 07/17/2022 for HFrEF / VT.  RV lead extraction / revision on 12/22/23 for RV Lead fracture. History of appropriate therapy: Yes, ATP 09/2023 History of AAD therapy: Yes; currently on amiodarone , mexiletine   CRT Optimization Significant stim involving 1 or 4. Baseline: M3-RV coil PAV 170 / SAV 120 - QRS 220 ms M3-RV coil PAV 170 / SAV 170 - QRS 208 ms (Second  choice if reprogramming needed) M3-RV coil PAV 200 / SAV 200 - QRS 200 ms, but deeper S wave in V1 M2-RV coil PAV/SAV 170 - QRS 204 ms M2-RV coil PAV/SAV 200 - QRS 206 ms    (1st choice with slightly better morphology)  Risk Assessment/Calculations:              Physical Exam:   VS:  BP 113/76   Pulse 65   Ht 6' (1.829 m)   Wt 224 lb (101.6 kg)   SpO2 100%   BMI 30.38 kg/m    Wt Readings from Last 3 Encounters:  04/08/24 224 lb (101.6 kg)  03/09/24 216 lb 9.6 oz (98.2 kg)  01/12/24 216 lb (98 kg)     GEN: Well nourished, well developed in no acute distress NECK: No JVD; No carotid bruits CARDIAC: Regular rate and rhythm, no murmurs, rubs, gallops RESPIRATORY:  Clear to auscultation without rales, wheezing or rhonchi  ABDOMEN: Soft, non-tender, non-distended EXTREMITIES:  No edema; No deformity   ASSESSMENT AND PLAN:    Chronic Systolic Dysfunction s/p [name]  VT  LVEF 30-35% 01/2024, paced QRS RBBB morphology 198 ms (post lead revision). Significant stim involving 1 or 4 on CRT optimization 01/12/24.  -euvolemic on exam / by device    -98% BiV pacing  -Stable on an appropriate medical regimen -Normal ICD function -See Pace Art report -Pulse width reviewed for A, RV and LV with adjustments  for battery conservation (with industry), see below  -follows with Dr. Rolan / Advanced HF Team  -continue amiodarone , mexiletine  -plan for amiodarone  follow up labs in February   A > initial 0.375V @ 0.5 ms, 0.5V @ 0.3 ms RV > initial 0.875V @ 0.5 ms, 1.125V @ 0.3 ms LV > initial 1.125V at 1ms, 1.75V @ 0.3 ms, 1.25V @ 0.8ms, 1.375V @ 0.6 ms   Polysubstance Abuse  -abstinence encouraged    Disposition:   Follow up with Dr. Cindie in 6 months   Signed, Daphne Barrack, NP-C, AGACNP-BC [facility] - Electrophysiology  04/08/2024, 5:07 PM

## 2024-04-08 ENCOUNTER — Ambulatory Visit: Attending: Cardiology | Admitting: Pulmonary Disease

## 2024-04-08 ENCOUNTER — Encounter: Payer: Self-pay | Admitting: Pulmonary Disease

## 2024-04-08 VITALS — BP 113/76 | HR 65 | Ht 72.0 in | Wt 224.0 lb

## 2024-04-08 DIAGNOSIS — I5022 Chronic systolic (congestive) heart failure: Secondary | ICD-10-CM | POA: Diagnosis not present

## 2024-04-08 DIAGNOSIS — F1910 Other psychoactive substance abuse, uncomplicated: Secondary | ICD-10-CM

## 2024-04-08 DIAGNOSIS — I472 Ventricular tachycardia, unspecified: Secondary | ICD-10-CM | POA: Diagnosis not present

## 2024-04-08 DIAGNOSIS — Z8674 Personal history of sudden cardiac arrest: Secondary | ICD-10-CM | POA: Diagnosis not present

## 2024-04-08 DIAGNOSIS — I428 Other cardiomyopathies: Secondary | ICD-10-CM

## 2024-04-08 DIAGNOSIS — Z95810 Presence of automatic (implantable) cardiac defibrillator: Secondary | ICD-10-CM

## 2024-04-08 LAB — CUP PACEART INCLINIC DEVICE CHECK
Date Time Interrogation Session: 20250828170457
Implantable Lead Connection Status: 753985
Implantable Lead Connection Status: 753985
Implantable Lead Connection Status: 753985
Implantable Lead Connection Status: 753985
Implantable Lead Implant Date: 20231206
Implantable Lead Implant Date: 20231206
Implantable Lead Implant Date: 20231206
Implantable Lead Implant Date: 20250512
Implantable Lead Location: 753858
Implantable Lead Location: 753859
Implantable Lead Location: 753860
Implantable Lead Location: 753860
Implantable Lead Model: 7122
Implantable Lead Model: 7122
Implantable Pulse Generator Implant Date: 20231206
Pulse Gen Serial Number: 5556126

## 2024-04-08 NOTE — Patient Instructions (Signed)
 Medication Instructions:  Your physician recommends that you continue on your current medications as directed. Please refer to the Current Medication list given to you today.  *If you need a refill on your cardiac medications before your next appointment, please call your pharmacy*  Lab Work: None ordered If you have labs (blood work) drawn today and your tests are completely normal, you will receive your results only by: MyChart Message (if you have MyChart) OR A paper copy in the mail If you have any lab test that is abnormal or we need to change your treatment, we will call you to review the results.  Follow-Up: At [facility], you and your health needs are our priority.  As part of our continuing mission to provide you with exceptional heart care, our providers are all part of one team.  This team includes your primary Cardiologist (physician) and Advanced Practice Providers or APPs (Physician Assistants and Nurse Practitioners) who all work together to provide you with the care you need, when you need it.  Your next appointment:   6 month(s)  Provider:   Creighton Doffing, NP    We recommend signing up for the patient portal called "MyChart".  Sign up information is provided on this After Visit Summary.  MyChart is used to connect with patients for Virtual Visits (Telemedicine).  Patients are able to view lab/test results, encounter notes, upcoming appointments, etc.  Non-urgent messages can be sent to your provider as well.   To learn more about what you can do with MyChart, go to [email or web address].

## 2024-04-09 ENCOUNTER — Ambulatory Visit: Payer: Self-pay | Admitting: Cardiology

## 2024-04-15 ENCOUNTER — Ambulatory Visit

## 2024-04-15 ENCOUNTER — Ambulatory Visit: Payer: Self-pay

## 2024-04-15 DIAGNOSIS — I472 Ventricular tachycardia, unspecified: Secondary | ICD-10-CM | POA: Diagnosis not present

## 2024-04-16 LAB — CUP PACEART REMOTE DEVICE CHECK
Battery Remaining Longevity: 67 mo
Battery Remaining Percentage: 73 %
Battery Voltage: 2.96 V
Brady Statistic AP VP Percent: 4 %
Brady Statistic AP VS Percent: 1 %
Brady Statistic AS VP Percent: 93 %
Brady Statistic AS VS Percent: 1.1 %
Brady Statistic RA Percent Paced: 3.3 %
Date Time Interrogation Session: 20250904081916
HighPow Impedance: 77 Ohm
HighPow Impedance: 77 Ohm
Lead Channel Impedance Value: 450 Ohm
Lead Channel Impedance Value: 690 Ohm
Lead Channel Impedance Value: 700 Ohm
Lead Channel Pacing Threshold Amplitude: 0.5 V
Lead Channel Pacing Threshold Amplitude: 1.5 V
Lead Channel Pacing Threshold Amplitude: 1.625 V
Lead Channel Pacing Threshold Pulse Width: 0.3 ms
Lead Channel Pacing Threshold Pulse Width: 0.3 ms
Lead Channel Pacing Threshold Pulse Width: 0.6 ms
Lead Channel Sensing Intrinsic Amplitude: 12 mV
Lead Channel Sensing Intrinsic Amplitude: 5 mV
Lead Channel Setting Pacing Amplitude: 1.5 V
Lead Channel Setting Pacing Amplitude: 2.5 V
Lead Channel Setting Pacing Amplitude: 2.625
Lead Channel Setting Pacing Pulse Width: 0.3 ms
Lead Channel Setting Pacing Pulse Width: 0.6 ms
Lead Channel Setting Sensing Sensitivity: 0.5 mV
Pulse Gen Serial Number: 5556126
Zone Setting Status: 755011

## 2024-04-17 ENCOUNTER — Ambulatory Visit: Payer: Self-pay | Admitting: Cardiology

## 2024-04-24 NOTE — Progress Notes (Signed)
Remote ICD Transmission.

## 2024-06-07 ENCOUNTER — Other Ambulatory Visit (HOSPITAL_COMMUNITY): Payer: Self-pay | Admitting: Cardiology

## 2024-06-07 DIAGNOSIS — I5022 Chronic systolic (congestive) heart failure: Secondary | ICD-10-CM

## 2024-06-16 ENCOUNTER — Encounter (HOSPITAL_COMMUNITY): Payer: Self-pay

## 2024-06-16 ENCOUNTER — Emergency Department (HOSPITAL_COMMUNITY)

## 2024-06-16 ENCOUNTER — Other Ambulatory Visit: Payer: Self-pay

## 2024-06-16 ENCOUNTER — Observation Stay (HOSPITAL_COMMUNITY)
Admission: EM | Admit: 2024-06-16 | Discharge: 2024-06-18 | Disposition: A | Discharge status: Home / Self Care | Attending: Internal Medicine | Admitting: Internal Medicine

## 2024-06-16 DIAGNOSIS — I130 Hypertensive heart and chronic kidney disease with heart failure and stage 1 through stage 4 chronic kidney disease, or unspecified chronic kidney disease: Secondary | ICD-10-CM | POA: Insufficient documentation

## 2024-06-16 DIAGNOSIS — W1830XA Fall on same level, unspecified, initial encounter: Principal | ICD-10-CM

## 2024-06-16 DIAGNOSIS — N400 Enlarged prostate without lower urinary tract symptoms: Secondary | ICD-10-CM | POA: Diagnosis present

## 2024-06-16 DIAGNOSIS — M79604 Pain in right leg: Secondary | ICD-10-CM

## 2024-06-16 DIAGNOSIS — S2249XA Multiple fractures of ribs, unspecified side, initial encounter for closed fracture: Secondary | ICD-10-CM | POA: Diagnosis present

## 2024-06-16 DIAGNOSIS — E785 Hyperlipidemia, unspecified: Secondary | ICD-10-CM | POA: Insufficient documentation

## 2024-06-16 DIAGNOSIS — J441 Chronic obstructive pulmonary disease with (acute) exacerbation: Secondary | ICD-10-CM | POA: Diagnosis present

## 2024-06-16 DIAGNOSIS — J449 Chronic obstructive pulmonary disease, unspecified: Secondary | ICD-10-CM | POA: Insufficient documentation

## 2024-06-16 DIAGNOSIS — D696 Thrombocytopenia, unspecified: Secondary | ICD-10-CM | POA: Insufficient documentation

## 2024-06-16 DIAGNOSIS — M48062 Spinal stenosis, lumbar region with neurogenic claudication: Secondary | ICD-10-CM

## 2024-06-16 DIAGNOSIS — N1832 Chronic kidney disease, stage 3b: Secondary | ICD-10-CM

## 2024-06-16 DIAGNOSIS — E7849 Other hyperlipidemia: Secondary | ICD-10-CM

## 2024-06-16 DIAGNOSIS — M48061 Spinal stenosis, lumbar region without neurogenic claudication: Secondary | ICD-10-CM | POA: Insufficient documentation

## 2024-06-16 DIAGNOSIS — N401 Enlarged prostate with lower urinary tract symptoms: Secondary | ICD-10-CM | POA: Diagnosis not present

## 2024-06-16 DIAGNOSIS — S2242XA Multiple fractures of ribs, left side, initial encounter for closed fracture: Principal | ICD-10-CM | POA: Insufficient documentation

## 2024-06-16 DIAGNOSIS — I5022 Chronic systolic (congestive) heart failure: Secondary | ICD-10-CM | POA: Insufficient documentation

## 2024-06-16 DIAGNOSIS — W010XXA Fall on same level from slipping, tripping and stumbling without subsequent striking against object, initial encounter: Secondary | ICD-10-CM | POA: Insufficient documentation

## 2024-06-16 DIAGNOSIS — R3911 Hesitancy of micturition: Secondary | ICD-10-CM

## 2024-06-16 DIAGNOSIS — N179 Acute kidney failure, unspecified: Secondary | ICD-10-CM | POA: Diagnosis not present

## 2024-06-16 DIAGNOSIS — E1122 Type 2 diabetes mellitus with diabetic chronic kidney disease: Secondary | ICD-10-CM | POA: Insufficient documentation

## 2024-06-16 DIAGNOSIS — N1831 Chronic kidney disease, stage 3a: Secondary | ICD-10-CM | POA: Diagnosis not present

## 2024-06-16 DIAGNOSIS — I4729 Other ventricular tachycardia: Secondary | ICD-10-CM | POA: Insufficient documentation

## 2024-06-16 DIAGNOSIS — I10 Essential (primary) hypertension: Secondary | ICD-10-CM | POA: Diagnosis present

## 2024-06-16 DIAGNOSIS — J42 Unspecified chronic bronchitis: Secondary | ICD-10-CM | POA: Diagnosis not present

## 2024-06-16 DIAGNOSIS — E119 Type 2 diabetes mellitus without complications: Secondary | ICD-10-CM | POA: Diagnosis present

## 2024-06-16 DIAGNOSIS — Z79899 Other long term (current) drug therapy: Secondary | ICD-10-CM | POA: Insufficient documentation

## 2024-06-16 DIAGNOSIS — F17210 Nicotine dependence, cigarettes, uncomplicated: Secondary | ICD-10-CM | POA: Insufficient documentation

## 2024-06-16 DIAGNOSIS — I2510 Atherosclerotic heart disease of native coronary artery without angina pectoris: Secondary | ICD-10-CM | POA: Insufficient documentation

## 2024-06-16 DIAGNOSIS — R569 Unspecified convulsions: Secondary | ICD-10-CM

## 2024-06-16 DIAGNOSIS — N183 Chronic kidney disease, stage 3 unspecified: Secondary | ICD-10-CM | POA: Diagnosis present

## 2024-06-16 LAB — PHOSPHORUS: Phosphorus: 2.6 mg/dL (ref 2.5–4.6)

## 2024-06-16 LAB — CBC WITH DIFFERENTIAL/PLATELET
Abs Immature Granulocytes: 0.02 K/uL (ref 0.00–0.07)
Basophils Absolute: 0 K/uL (ref 0.0–0.1)
Basophils Relative: 1 %
Eosinophils Absolute: 0 K/uL (ref 0.0–0.5)
Eosinophils Relative: 0 %
HCT: 42.1 % (ref 39.0–52.0)
Hemoglobin: 13.3 g/dL (ref 13.0–17.0)
Immature Granulocytes: 0 %
Lymphocytes Relative: 11 %
Lymphs Abs: 0.6 K/uL — ABNORMAL LOW (ref 0.7–4.0)
MCH: 28.1 pg (ref 26.0–34.0)
MCHC: 31.6 g/dL (ref 30.0–36.0)
MCV: 88.8 fL (ref 80.0–100.0)
Monocytes Absolute: 1.5 K/uL — ABNORMAL HIGH (ref 0.1–1.0)
Monocytes Relative: 29 %
Neutro Abs: 3 K/uL (ref 1.7–7.7)
Neutrophils Relative %: 59 %
Platelets: 89 K/uL — ABNORMAL LOW (ref 150–400)
RBC: 4.74 MIL/uL (ref 4.22–5.81)
RDW: 16.8 % — ABNORMAL HIGH (ref 11.5–15.5)
WBC: 5.1 K/uL (ref 4.0–10.5)
nRBC: 0 % (ref 0.0–0.2)

## 2024-06-16 LAB — COMPREHENSIVE METABOLIC PANEL WITH GFR
ALT: 27 U/L (ref 0–44)
AST: 30 U/L (ref 15–41)
Albumin: 3.2 g/dL — ABNORMAL LOW (ref 3.5–5.0)
Alkaline Phosphatase: 151 U/L — ABNORMAL HIGH (ref 38–126)
Anion gap: 10 (ref 5–15)
BUN: 32 mg/dL — ABNORMAL HIGH (ref 8–23)
CO2: 21 mmol/L — ABNORMAL LOW (ref 22–32)
Calcium: 8.7 mg/dL — ABNORMAL LOW (ref 8.9–10.3)
Chloride: 104 mmol/L (ref 98–111)
Creatinine, Ser: 2.35 mg/dL — ABNORMAL HIGH (ref 0.61–1.24)
GFR, Estimated: 29 mL/min — ABNORMAL LOW (ref >60)
Glucose, Bld: 81 mg/dL (ref 70–99)
Potassium: 4.9 mmol/L (ref 3.5–5.1)
Sodium: 135 mmol/L (ref 135–145)
Total Bilirubin: 0.5 mg/dL (ref 0.0–1.2)
Total Protein: 7.7 g/dL (ref 6.5–8.1)

## 2024-06-16 LAB — I-STAT CHEM 8, ED
BUN: 34 mg/dL — ABNORMAL HIGH (ref 8–23)
Calcium, Ion: 1.21 mmol/L (ref 1.15–1.40)
Chloride: 105 mmol/L (ref 98–111)
Creatinine, Ser: 2.5 mg/dL — ABNORMAL HIGH (ref 0.61–1.24)
Glucose, Bld: 79 mg/dL (ref 70–99)
HCT: 42 % (ref 39.0–52.0)
Hemoglobin: 14.3 g/dL (ref 13.0–17.0)
Potassium: 5 mmol/L (ref 3.5–5.1)
Sodium: 138 mmol/L (ref 135–145)
TCO2: 24 mmol/L (ref 22–32)

## 2024-06-16 LAB — CBG MONITORING, ED: Glucose-Capillary: 76 mg/dL (ref 70–99)

## 2024-06-16 LAB — BASIC METABOLIC PANEL WITH GFR
Anion gap: 10 (ref 5–15)
BUN: 29 mg/dL — ABNORMAL HIGH (ref 8–23)
CO2: 23 mmol/L (ref 22–32)
Calcium: 8.9 mg/dL (ref 8.9–10.3)
Chloride: 104 mmol/L (ref 98–111)
Creatinine, Ser: 2.16 mg/dL — ABNORMAL HIGH (ref 0.61–1.24)
GFR, Estimated: 32 mL/min — ABNORMAL LOW (ref >60)
Glucose, Bld: 79 mg/dL (ref 70–99)
Potassium: 5 mmol/L (ref 3.5–5.1)
Sodium: 137 mmol/L (ref 135–145)

## 2024-06-16 LAB — OSMOLALITY: Osmolality: 297 mosm/kg — ABNORMAL HIGH (ref 275–295)

## 2024-06-16 LAB — MAGNESIUM: Magnesium: 2.2 mg/dL (ref 1.7–2.4)

## 2024-06-16 LAB — HEMOGLOBIN A1C
Hgb A1c MFr Bld: 5.6 % (ref 4.8–5.6)
Mean Plasma Glucose: 114.02 mg/dL

## 2024-06-16 LAB — CK: Total CK: 232 U/L (ref 49–397)

## 2024-06-16 LAB — PROTIME-INR
INR: 1 (ref 0.8–1.2)
Prothrombin Time: 14.1 s (ref 11.4–15.2)

## 2024-06-16 MED ORDER — ROSUVASTATIN CALCIUM 20 MG PO TABS
40.0000 mg | ORAL_TABLET | Freq: Every day | ORAL | Status: DC
  Administered 2024-06-17 – 2024-06-18 (×2): 40 mg via ORAL
  Filled 2024-06-16 (×2): qty 2

## 2024-06-16 MED ORDER — ACETAMINOPHEN 325 MG PO TABS: 650.0000 mg | ORAL_TABLET | Freq: Four times a day (QID) | ORAL | Status: DC | PRN

## 2024-06-16 MED ORDER — GABAPENTIN 300 MG PO CAPS
600.0000 mg | ORAL_CAPSULE | Freq: Two times a day (BID) | ORAL | Status: DC
  Administered 2024-06-16 – 2024-06-18 (×4): 600 mg via ORAL
  Filled 2024-06-16 (×4): qty 2

## 2024-06-16 MED ORDER — ONDANSETRON HCL 4 MG PO TABS: 4.0000 mg | ORAL_TABLET | Freq: Four times a day (QID) | ORAL | Status: DC | PRN

## 2024-06-16 MED ORDER — LACTATED RINGERS IV BOLUS
500.0000 mL | Freq: Once | INTRAVENOUS | Status: AC
  Administered 2024-06-16: 500 mL via INTRAVENOUS

## 2024-06-16 MED ORDER — SODIUM CHLORIDE 0.9 % IV SOLN: INTRAVENOUS | Status: DC

## 2024-06-16 MED ORDER — OXYCODONE HCL 5 MG PO TABS
2.5000 mg | ORAL_TABLET | Freq: Once | ORAL | Status: AC
  Administered 2024-06-16: 2.5 mg via ORAL
  Filled 2024-06-16: qty 1

## 2024-06-16 MED ORDER — LIDOCAINE 5 % EX PTCH
1.0000 | MEDICATED_PATCH | CUTANEOUS | Status: DC
  Administered 2024-06-16 – 2024-06-17 (×2): 1 via TRANSDERMAL
  Filled 2024-06-16 (×2): qty 1

## 2024-06-16 MED ORDER — TAMSULOSIN HCL 0.4 MG PO CAPS
0.4000 mg | ORAL_CAPSULE | Freq: Every day | ORAL | Status: DC
  Administered 2024-06-16 – 2024-06-17 (×2): 0.4 mg via ORAL
  Filled 2024-06-16 (×2): qty 1

## 2024-06-16 MED ORDER — CARVEDILOL 3.125 MG PO TABS
3.1250 mg | ORAL_TABLET | Freq: Two times a day (BID) | ORAL | Status: DC
  Administered 2024-06-16 – 2024-06-18 (×4): 3.125 mg via ORAL
  Filled 2024-06-16 (×4): qty 1

## 2024-06-16 MED ORDER — HYDROCODONE-ACETAMINOPHEN 5-325 MG PO TABS
1.0000 | ORAL_TABLET | ORAL | Status: DC | PRN
  Administered 2024-06-16 – 2024-06-17 (×4): 2 via ORAL
  Filled 2024-06-16: qty 1
  Filled 2024-06-16 (×2): qty 2
  Filled 2024-06-16: qty 1
  Filled 2024-06-16: qty 2

## 2024-06-16 MED ORDER — FENTANYL CITRATE (PF) 50 MCG/ML IJ SOSY: 12.5000 ug | PREFILLED_SYRINGE | INTRAMUSCULAR | Status: DC | PRN

## 2024-06-16 MED ORDER — UMECLIDINIUM BROMIDE 62.5 MCG/ACT IN AEPB
1.0000 | INHALATION_SPRAY | Freq: Every day | RESPIRATORY_TRACT | Status: DC
  Administered 2024-06-18: 1 via RESPIRATORY_TRACT
  Filled 2024-06-16 (×2): qty 7

## 2024-06-16 MED ORDER — ARFORMOTEROL TARTRATE 15 MCG/2ML IN NEBU
15.0000 ug | INHALATION_SOLUTION | Freq: Two times a day (BID) | RESPIRATORY_TRACT | Status: DC
  Administered 2024-06-17 – 2024-06-18 (×3): 15 ug via RESPIRATORY_TRACT
  Filled 2024-06-16 (×3): qty 2

## 2024-06-16 MED ORDER — TIZANIDINE HCL 4 MG PO TABS
2.0000 mg | ORAL_TABLET | Freq: Once | ORAL | Status: AC
  Administered 2024-06-16: 2 mg via ORAL
  Filled 2024-06-16: qty 1

## 2024-06-16 MED ORDER — ACETAMINOPHEN 500 MG PO TABS
1000.0000 mg | ORAL_TABLET | Freq: Once | ORAL | Status: AC
  Administered 2024-06-16: 1000 mg via ORAL
  Filled 2024-06-16: qty 2

## 2024-06-16 MED ORDER — ONDANSETRON HCL 4 MG/2ML IJ SOLN: 4.0000 mg | Freq: Four times a day (QID) | INTRAMUSCULAR | Status: DC | PRN

## 2024-06-16 MED ORDER — ALBUTEROL SULFATE (2.5 MG/3ML) 0.083% IN NEBU: 2.5000 mg | INHALATION_SOLUTION | RESPIRATORY_TRACT | Status: DC | PRN

## 2024-06-16 MED ORDER — OXYCODONE-ACETAMINOPHEN 5-325 MG PO TABS
1.0000 | ORAL_TABLET | Freq: Once | ORAL | Status: AC
  Administered 2024-06-16: 1 via ORAL
  Filled 2024-06-16: qty 1

## 2024-06-16 MED ORDER — AMIODARONE HCL 200 MG PO TABS
200.0000 mg | ORAL_TABLET | Freq: Every day | ORAL | Status: DC
  Administered 2024-06-17 – 2024-06-18 (×2): 200 mg via ORAL
  Filled 2024-06-16 (×2): qty 1

## 2024-06-16 MED ORDER — SODIUM CHLORIDE 0.9 % IV SOLN: INTRAVENOUS | Status: AC

## 2024-06-16 MED ORDER — INSULIN ASPART 100 UNIT/ML IJ SOLN
0.0000 [IU] | INTRAMUSCULAR | Status: DC
  Administered 2024-06-17 – 2024-06-18 (×2): 1 [IU] via SUBCUTANEOUS
  Filled 2024-06-16 (×2): qty 1

## 2024-06-16 MED ORDER — ACETAMINOPHEN 650 MG RE SUPP: 650.0000 mg | Freq: Four times a day (QID) | RECTAL | Status: DC | PRN

## 2024-06-16 NOTE — Assessment & Plan Note (Signed)
Continue amiodarone 200 mg po qday

## 2024-06-16 NOTE — H&P (Addendum)
 Edward Mullen FMW:[id number] DOB: [birth year] DOA: 06/16/2024     PCP: Leontine Cramp, NP   Outpatient Specialists:   CARDS: Dr.  Rolan    Patient arrived to ER on 06/16/24 at 1417 Referred by Attending Rogelia Jerilynn RAMAN, MD   Patient coming from:    home Lives   With family    Chief Complaint: fall   HPI: Edward Mullen is a 68 y.o. male with medical history significant of CKD stage III AA, systolic CHF, polysubstance abuse BPH history of V. 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 , CAD status post stent 2023    Presented with mechanical fall states he is tripped on his grandkids toy States he has not been drinking alcohol does not smoke tobacco but continues to smoke marijuana. Reports no chest pain except for when he fell down. Reports he has been taking all his medications as prescribed At baseline uses walker.  Tripped over his grand kids toy falling on left side CT found 9th and 10th rib fractures Noted to have worsening renal function  Patient had an admission back in May for syncope secondary to hypovolemia  Denies significant ETOH intake   Does not smoke   marijuana use      Regarding pertinent Chronic problems:    Hyperlipidemia -  on statins  Rosuvustatin Lipid Panel     Component Value Date/Time   CHOL 125 01/19/2024 1603   CHOL 108 03/09/2019 1100   TRIG 91 01/19/2024 1603   HDL 33 (L) 01/19/2024 1603   HDL 44 03/09/2019 1100   CHOLHDL 3.8 01/19/2024 1603   VLDL 18 01/19/2024 1603   LDLCALC 74 01/19/2024 1603   LDLCALC 55 03/09/2019 1100   LABVLDL 9 03/09/2019 1100     HTN on Coreg  losartan    chronic CHF diastolic/systolic/ combined - last echo  Recent Results (from the past 56199 hours)  ECHOCARDIOGRAM COMPLETE   Collection Time: 02/05/24 11:30 AM  Result Value   S' Lateral 4.70   Area-P 1/2 2.90   Est EF 30 - 35%   Narrative      ECHOCARDIOGRAM REPORT      IMPRESSIONS    1. Septal dyssynchrony  due to LBBB. Left ventricular ejection fraction, by estimation, is 30 to 35%. The left ventricle has moderately decreased function. The left ventricle demonstrates global hypokinesis. The left ventricular internal cavity size was  mildly dilated. There is mild concentric left ventricular hypertrophy. Left ventricular diastolic parameters are consistent with Grade I diastolic dysfunction (impaired relaxation).  2. Right ventricular systolic function is normal. The right ventricular size is normal.  3. Left atrial size was mildly dilated.  4. The mitral valve is normal in structure. No evidence of mitral valve regurgitation. No evidence of mitral stenosis.  5. The aortic valve is tricuspid. There is mild calcification of the aortic valve. Aortic valve regurgitation is mild. No aortic stenosis is present.  6. There is mild dilatation of the ascending aorta, measuring 40 mm.  7. The inferior vena cava is normal in size with greater than 50% respiratory variability, suggesting right atrial pressure of 3 mmHg.           CAD  - On Aspirin , statin, betablocker,                  -  followed by cardiology                - last cardiac cath 2023  COPD - not  followed by pulmonology   not  on baseline oxygen     VT on amiodarone     CKD stage IIIa  baseline Cr 1.7 Estimated Creatinine Clearance: 34 mL/min (A) (by C-G formula based on SCr of 2.5 mg/dL (H)).  Lab Results  Component Value Date   CREATININE 2.50 (H) 06/16/2024   CREATININE 2.35 (H) 06/16/2024   CREATININE 1.77 (H) 03/19/2024   Lab Results  Component Value Date   NA 138 06/16/2024   CL 105 06/16/2024   K 5.0 06/16/2024   CO2 21 (L) 06/16/2024   BUN 34 (H) 06/16/2024   CREATININE 2.50 (H) 06/16/2024   GFRNONAA 29 (L) 06/16/2024   CALCIUM  8.7 (L) 06/16/2024   PHOS 4.1 11/21/2022   ALBUMIN  3.2 (L) 06/16/2024   GLUCOSE 79 06/16/2024       BPH - on Flomax ,         Chronic anemia - baseline hg  Hemoglobin & Hematocrit  Recent Labs    12/29/23 0544 06/16/24 1530 06/16/24 1546  HGB 12.0* 13.3 14.3   Iron/TIBC/Ferritin/ %Sat    Component Value Date/Time   IRON 36 (L) 10/06/2021 0238   TIBC 325 10/06/2021 0238   FERRITIN 88 10/06/2021 0238   IRONPCTSAT 11 (L) 10/06/2021 0238      While in ER:   Found to have rib fractures rib 10 and 9 as well as worsening AKI  Also platelet count of 89  Lab Orders         Comprehensive metabolic panel         CBC WITH DIFFERENTIAL         I-Stat Chem 8, ED      CT HEAD/ ct cervical spine   NON acute   CT L spine - Postoperative and degenerative changes in the lumbar spine     CTabd/pelvis chest - Trace left pleural effusion with left basilar atelectasis.  Prostate enlargement.     Following Medications were ordered in ER: Medications  lidocaine  (LIDODERM ) 5 % 1 patch (1 patch Transdermal Patch Applied 06/16/24 1535)  tiZANidine (ZANAFLEX) tablet 2 mg (2 mg Oral Given 06/16/24 1536)  oxyCODONE  (Oxy IR/ROXICODONE ) immediate release tablet 2.5 mg (2.5 mg Oral Given 06/16/24 1536)  acetaminophen  (TYLENOL ) tablet 1,000 mg (1,000 mg Oral Given 06/16/24 1536)  lactated ringers  bolus 500 mL (500 mLs Intravenous New Bag/Given 06/16/24 1922)        ED Triage Vitals  Encounter Vitals Group     BP 06/16/24 1420 105/73     Girls Systolic BP Percentile --      Girls Diastolic BP Percentile --      Boys Systolic BP Percentile --      Boys Diastolic BP Percentile --      Pulse Rate 06/16/24 1420 76     Resp 06/16/24 1420 16     Temp 06/16/24 1420 97.9 F (36.6 C)     Temp src --      SpO2 06/16/24 1420 100 %     Weight 06/16/24 1418 225 lb (102.1 kg)     Height 06/16/24 1418 6' (1.829 m)     Head Circumference --      Peak Flow --      Pain Score 06/16/24 1418 8     Pain Loc --      Pain Education --      Exclude from Growth Chart --   UFJK(75)@     _________________________________________ Significant initial  Findings: Abnormal Labs Reviewed  COMPREHENSIVE METABOLIC PANEL WITH GFR - Abnormal; Notable for the following components:      Result Value   CO2 21 (*)    BUN 32 (*)    Creatinine, Ser 2.35 (*)    Calcium  8.7 (*)    Albumin  3.2 (*)    Alkaline Phosphatase 151 (*)    GFR, Estimated 29 (*)    All other components within normal limits  CBC WITH DIFFERENTIAL/PLATELET - Abnormal; Notable for the following components:   RDW 16.8 (*)    Platelets 89 (*)    Lymphs Abs 0.6 (*)    Monocytes Absolute 1.5 (*)    All other components within normal limits  I-STAT CHEM 8, ED - Abnormal; Notable for the following components:   BUN 34 (*)    Creatinine, Ser 2.50 (*)    All other components within normal limits    ECG: Ordered  Personally reviewed by me Heart rate 69 paced rhythm QTc 531  BNP (last 3 results) Recent Labs    12/28/23 1733 01/19/24 1603 03/09/24 1050  BNP 104.4* 212.6* 268.9*       The recent clinical data is shown below. Vitals:   06/16/24 1418 06/16/24 1420  BP:  105/73  Pulse:  76  Resp:  16  Temp:  97.9 F (36.6 C)  SpO2:  100%  Weight: 102.1 kg   Height: 6' (1.829 m)     WBC     Component Value Date/Time   WBC 5.1 06/16/2024 1530   LYMPHSABS 0.6 (L) 06/16/2024 1530   LYMPHSABS 1.4 06/28/2022 1558   MONOABS 1.5 (H) 06/16/2024 1530   EOSABS 0.0 06/16/2024 1530   EOSABS 0.2 06/28/2022 1558   BASOSABS 0.0 06/16/2024 1530   BASOSABS 0.0 06/28/2022 1558      UA   ordered      __________________________________________________________ Recent Labs  Lab 06/16/24 1530 06/16/24 1546  NA 135 138  K 4.9 5.0  CO2 21*  --   GLUCOSE 81 79  BUN 32* 34*  CREATININE 2.35* 2.50*  CALCIUM  8.7*  --     Cr   Up from baseline see below Lab Results  Component Value Date   CREATININE 2.50 (H) 06/16/2024   CREATININE 2.35 (H) 06/16/2024   CREATININE 1.77 (H) 03/19/2024    Recent Labs  Lab 06/16/24 1530  AST 30  ALT 27  ALKPHOS 151*  BILITOT 0.5   PROT 7.7  ALBUMIN  3.2*   Lab Results  Component Value Date   CALCIUM  8.7 (L) 06/16/2024   PHOS 4.1 11/21/2022       Plt: Lab Results  Component Value Date   PLT 89 (L) 06/16/2024         Recent Labs  Lab 06/16/24 1530 06/16/24 1546  WBC 5.1  --   NEUTROABS 3.0  --   HGB 13.3 14.3  HCT 42.1 42.0  MCV 88.8  --   PLT 89*  --     HG/HCT   stable,      Component Value Date/Time   HGB 14.3 06/16/2024 1546   HGB 13.5 12/15/2023 1112   HCT 42.0 06/16/2024 1546   HCT 41.5 12/15/2023 1112   MCV 88.8 06/16/2024 1530   MCV 86 12/15/2023 1112       _______________________________________________ Hospitalist was called for admission for   Ground-level fall  AKI (acute kidney injury)    The following Work up has been ordered so far:  Orders Placed  This Encounter  Procedures   DG Shoulder Left   CT HEAD WO CONTRAST   CT CERVICAL SPINE WO CONTRAST   CT T-SPINE NO CHARGE   CT CHEST ABDOMEN PELVIS WO CONTRAST   CT L-SPINE NO CHARGE   Comprehensive metabolic panel   CBC WITH DIFFERENTIAL   Diet NPO time specified   Measure blood pressure   Consult for [facility] Admission   ED Pulse oximetry, continuous   I-Stat Chem 8, ED     OTHER Significant initial  Findings:  labs showing:     DM  labs:  HbA1C: No results for input(s): HGBA1C in the last 8760 hours.     CBG (last 3)  No results for input(s): GLUCAP in the last 72 hours.        Cultures:    Component Value Date/Time   SDES URINE, CLEAN CATCH 06/29/2023 2201   SPECREQUEST  06/29/2023 2201    NONE Performed at [facility], [address] [place], [place] [address]    CULT >=100,000 COLONIES/mL ESCHERICHIA COLI (A) 06/29/2023 2201   REPTSTATUS 07/02/2023 FINAL 06/29/2023 2201     Radiological Exams on Admission: CT L-SPINE NO CHARGE Result Date: 06/16/2024 EXAM: CT OF THE LUMBAR SPINE WITHOUT CONTRAST 06/16/2024 05:42:00 PM TECHNIQUE: CT of the lumbar spine was performed  without the administration of intravenous contrast. Multiplanar reformatted images are provided for review. Automated exposure control, iterative reconstruction, and/or weight based adjustment of the mA/kV was utilized to reduce the radiation dose to as low as reasonably achievable. COMPARISON: 06/10/2019 CLINICAL HISTORY: FINDINGS: BONES AND ALIGNMENT: Normal vertebral body heights. Posterior fusion changes from L3 to S1. Posterior left 10th rib fracture noted. Normal alignment. DEGENERATIVE CHANGES: Spurring in the lower thoracic and upper lumbar spine. SOFT TISSUES: No acute abnormality. IMPRESSION: 1. Posterior left 10th rib fracture. 2. Postoperative and degenerative changes in the lumbar spine. Electronically signed by: Franky Crease MD 06/16/2024 06:14 PM EST RP Workstation: [id number]   CT T-SPINE NO CHARGE Result Date: 06/16/2024 EXAM: CT THORACIC SPINE WITHOUT CONTRAST 06/16/2024 05:42:00 PM TECHNIQUE: CT of the thoracic spine was performed without the administration of intravenous contrast. Multiplanar reformatted images are provided for review. Automated exposure control, iterative reconstruction, and/or weight based adjustment of the mA/kV was utilized to reduce the radiation dose to as low as reasonably achievable. COMPARISON: Comparison to today's CT chest, abdomen, and pelvis. CLINICAL HISTORY: FINDINGS: BONES AND ALIGNMENT: Normal vertebral body heights. No acute fracture or suspicious bone lesion. Normal alignment. DEGENERATIVE CHANGES: Flowing anterior and right lateral osteophytes. SOFT TISSUES: No acute abnormality. IMPRESSION: 1. No acute abnormality of the thoracic spine. Electronically signed by: Franky Crease MD 06/16/2024 06:12 PM EST RP Workstation: [id number]   CT CHEST ABDOMEN PELVIS WO CONTRAST Result Date: 06/16/2024 EXAM: CT CHEST, ABDOMEN AND PELVIS WITHOUT CONTRAST 06/16/2024 05:42:00 PM TECHNIQUE: CT of the chest, abdomen and pelvis was performed without the administration of  intravenous contrast. Multiplanar reformatted images are provided for review. Automated exposure control, iterative reconstruction, and/or weight based adjustment of the mA/kV was utilized to reduce the radiation dose to as low as reasonably achievable. COMPARISON: 07/07/2023 CLINICAL HISTORY: Polytrauma, blunt; fall on to left side, diffuse abdominal pain. FINDINGS: CHEST: MEDIASTINUM AND LYMPH NODES: Pacer in place with leads in the right heart. Coronary artery and aortic atherosclerosis. The central airways are clear. No mediastinal, hilar or axillary lymphadenopathy. LUNGS AND PLEURA: Trace left pleural effusion and left base atelectasis. No focal consolidation or pulmonary edema. No  pneumothorax. ABDOMEN AND PELVIS: LIVER: The liver is unremarkable. GALLBLADDER AND BILE DUCTS: Gallbladder is unremarkable. No biliary ductal dilatation. SPLEEN: No acute abnormality. PANCREAS: No acute abnormality. ADRENAL GLANDS: No acute abnormality. KIDNEYS, URETERS AND BLADDER: No stones in the kidneys or ureters. No hydronephrosis. No perinephric or periureteral stranding. Urinary bladder is unremarkable. GI AND BOWEL: Stomach demonstrates no acute abnormality. Diverticulosis. There is no bowel obstruction. APPENDIX: Normal appendix. REPRODUCTIVE ORGANS: Prostate enlargement. PERITONEUM AND RETROPERITONEUM: No ascites. No free air. VASCULATURE: Aorta is normal in caliber. Aortic atherosclerosis. ABDOMINAL AND PELVIS LYMPH NODES: No lymphadenopathy. BONES AND SOFT TISSUES: Posterior left 9th and 10th rib fractures. Postoperative and degenerative changes in the lumbar spine. No focal soft tissue abnormality. IMPRESSION: 1. Posterior left 9th and 10th rib fractures. 2. Trace left pleural effusion with left basilar atelectasis. 3. No acute findings in the abdomen or pelvis. 4. Scattered colonic diverticulosis. 5. Prostate enlargement. Electronically signed by: Franky Crease MD 06/16/2024 06:10 PM EST RP Workstation: [id number]    CT HEAD WO CONTRAST Result Date: 06/16/2024 CLINICAL DATA:  Moderate head/neck trauma from fall. EXAM: CT HEAD WITHOUT CONTRAST CT CERVICAL SPINE WITHOUT CONTRAST TECHNIQUE: Multidetector CT imaging of the head and cervical spine was performed following the standard protocol without intravenous contrast. Multiplanar CT image reconstructions of the cervical spine were also generated. RADIATION DOSE REDUCTION: This exam was performed according to the departmental dose-optimization program which includes automated exposure control, adjustment of the mA and/or kV according to patient size and/or use of iterative reconstruction technique. COMPARISON:  Head CT 12/28/2023 FINDINGS: CT HEAD FINDINGS Brain: Ventricles, cisterns and other CSF spaces are normal. There is no mass, mass effect, shift of midline structures or acute hemorrhage. Mild chronic ischemic microvascular disease is present. Vascular: No hyperdense vessel or unexpected calcification. Skull: Normal. Negative for fracture or focal lesion. Sinuses/Orbits: No acute finding. Other: None. CT CERVICAL SPINE FINDINGS Alignment: Normal. Skull base and vertebrae: Moderate spondylosis throughout the cervical spine to include uncovertebral joint spurring and facet arthropathy. Vertebral body heights are maintained. Atlantoaxial articulation is unremarkable. No acute fracture. Bilateral neural foraminal narrowing at the C4-5 level right worse than left. Bilateral neural foraminal narrowing at the C5-6 and C6-7 levels. Soft tissues and spinal canal: Minimal canal stenosis at the C6-7 level and to lesser extent at the C5-6 and C2-3 levels. Prevertebral soft tissues are normal. Disc levels: Moderate disc space narrowing at the C5-6 and C6-7 levels and to lesser extent at the C4-5 level. Upper chest: No acute findings. Other: None. IMPRESSION: 1. No acute brain injury. 2. Mild chronic ischemic microvascular disease. 3. No acute cervical spine injury. 4. Moderate  spondylosis throughout the cervical spine with multilevel disc disease, multilevel neural foraminal narrowing and mild canal stenosis as described. Electronically Signed   By: Toribio Agreste M.D.   On: 06/16/2024 16:58   CT CERVICAL SPINE WO CONTRAST Result Date: 06/16/2024 CLINICAL DATA:  Moderate head/neck trauma from fall. EXAM: CT HEAD WITHOUT CONTRAST CT CERVICAL SPINE WITHOUT CONTRAST TECHNIQUE: Multidetector CT imaging of the head and cervical spine was performed following the standard protocol without intravenous contrast. Multiplanar CT image reconstructions of the cervical spine were also generated. RADIATION DOSE REDUCTION: This exam was performed according to the departmental dose-optimization program which includes automated exposure control, adjustment of the mA and/or kV according to patient size and/or use of iterative reconstruction technique. COMPARISON:  Head CT 12/28/2023 FINDINGS: CT HEAD FINDINGS Brain: Ventricles, cisterns and other CSF spaces are normal. There is no  mass, mass effect, shift of midline structures or acute hemorrhage. Mild chronic ischemic microvascular disease is present. Vascular: No hyperdense vessel or unexpected calcification. Skull: Normal. Negative for fracture or focal lesion. Sinuses/Orbits: No acute finding. Other: None. CT CERVICAL SPINE FINDINGS Alignment: Normal. Skull base and vertebrae: Moderate spondylosis throughout the cervical spine to include uncovertebral joint spurring and facet arthropathy. Vertebral body heights are maintained. Atlantoaxial articulation is unremarkable. No acute fracture. Bilateral neural foraminal narrowing at the C4-5 level right worse than left. Bilateral neural foraminal narrowing at the C5-6 and C6-7 levels. Soft tissues and spinal canal: Minimal canal stenosis at the C6-7 level and to lesser extent at the C5-6 and C2-3 levels. Prevertebral soft tissues are normal. Disc levels: Moderate disc space narrowing at the C5-6 and C6-7  levels and to lesser extent at the C4-5 level. Upper chest: No acute findings. Other: None. IMPRESSION: 1. No acute brain injury. 2. Mild chronic ischemic microvascular disease. 3. No acute cervical spine injury. 4. Moderate spondylosis throughout the cervical spine with multilevel disc disease, multilevel neural foraminal narrowing and mild canal stenosis as described. Electronically Signed   By: Toribio Agreste M.D.   On: 06/16/2024 16:58   DG Shoulder Left Result Date: 06/16/2024 CLINICAL DATA:  Left shoulder pain post fall. EXAM: LEFT SHOULDER - 2+ VIEW COMPARISON:  None Available. FINDINGS: Mild degenerate change over the [facility] joint and glenohumeral joints. No evidence of acute fracture or dislocation. IMPRESSION: 1. No acute findings. 2. Mild degenerative changes. Electronically Signed   By: Toribio Agreste M.D.   On: 06/16/2024 16:49   _______________________________________________________________________________________________________ Latest  Blood pressure 105/73, pulse 76, temperature 97.9 F (36.6 C), resp. rate 16, height 6' (1.829 m), weight 102.1 kg, SpO2 100%.   Vitals  labs and radiology finding personally reviewed  Review of Systems:    Pertinent positives include:  chest pain, fall  Constitutional:  No weight loss, night sweats, Fevers, chills, fatigue, weight loss  HEENT:  No headaches, Difficulty swallowing,Tooth/dental problems,Sore throat,  No sneezing, itching, ear ache, nasal congestion, post nasal drip,  Cardio-vascular:  No Orthopnea, PND, anasarca, dizziness, palpitations.no Bilateral lower extremity swelling  GI:  No heartburn, indigestion, abdominal pain, nausea, vomiting, diarrhea, change in bowel habits, loss of appetite, melena, blood in stool, hematemesis Resp:  no shortness of breath at rest. No dyspnea on exertion, No excess mucus, no productive cough, No non-productive cough, No coughing up of blood.No change in color of mucus.No wheezing. Skin:  no rash or  lesions. No jaundice GU:  no dysuria, change in color of urine, no urgency or frequency. No straining to urinate.  No flank pain.  Musculoskeletal:  No joint pain or no joint swelling. No decreased range of motion. No back pain.  Psych:  No change in mood or affect. No depression or anxiety. No memory loss.  Neuro: no localizing neurological complaints, no tingling, no weakness, no double vision, no gait abnormality, no slurred speech, no confusion  All systems reviewed and apart from HOPI all are negative _______________________________________________________________________________________________ Past Medical History:   Past Medical History:  Diagnosis Date   Arthritis    feels like it in my legs (09/20/2014)   CAD (coronary artery disease)    Chronic kidney disease, stage 3a (HCC)    Chronic systolic CHF (congestive heart failure) (HCC)    Cocaine  abuse (HCC)    Diabetes mellitus without complication (HCC) 10/05/2021   type 2   Heart murmur    Hypertension    NICM (  nonischemic cardiomyopathy) (HCC)    NSVT (nonsustained ventricular tachycardia) (HCC) 09/06/2016   pre diabetes    patient denies      Past Surgical History:  Procedure Laterality Date   BIV ICD INSERTION CRT-D N/A 07/17/2022   Procedure: BIV ICD INSERTION CRT-D;  Surgeon: Fernande Elspeth BROCKS, MD;  Location: [facility] INVASIVE CV LAB;  Service: Cardiovascular;  Laterality: N/A;   CARDIAC CATHETERIZATION N/A 08/27/2016   Procedure: Right/Left Heart Cath and Coronary Angiography;  Surgeon: Ezra GORMAN Shuck, MD;  Location: [facility] INVASIVE CV LAB;  Service: Cardiovascular;  Laterality: N/A;   COLONOSCOPY     CYSTECTOMY Right    back of my shoulder   IR RADIOLOGIST EVAL & MGMT  08/27/2023   LEAD EXTRACTION N/A 12/22/2023   Procedure: LEAD EXTRACTION;  Surgeon: Cindie Ole DASEN, MD;  Location: [facility] INVASIVE CV LAB;  Service: Cardiovascular;  Laterality: N/A;   LEAD INSERTION N/A 12/22/2023   Procedure: LEAD INSERTION;  Surgeon:  Cindie Ole DASEN, MD;  Location: [facility] INVASIVE CV LAB;  Service: Cardiovascular;  Laterality: N/A;   RIGHT/LEFT HEART CATH AND CORONARY ANGIOGRAPHY N/A 12/04/2020   Procedure: RIGHT/LEFT HEART CATH AND CORONARY ANGIOGRAPHY;  Surgeon: Shuck Ezra GORMAN, MD;  Location: [facility] INVASIVE CV LAB;  Service: Cardiovascular;  Laterality: N/A;   RIGHT/LEFT HEART CATH AND CORONARY ANGIOGRAPHY N/A 10/10/2021   Procedure: RIGHT/LEFT HEART CATH AND CORONARY ANGIOGRAPHY;  Surgeon: Shuck Ezra GORMAN, MD;  Location: [facility] INVASIVE CV LAB;  Service: Cardiovascular;  Laterality: N/A;   TONSILLECTOMY     TRANSESOPHAGEAL ECHOCARDIOGRAM (CATH LAB) N/A 12/22/2023   Procedure: TRANSESOPHAGEAL ECHOCARDIOGRAM;  Surgeon: Cindie Ole DASEN, MD;  Location: [facility] INVASIVE CV LAB;  Service: Cardiovascular;  Laterality: N/A;    Social History:  Ambulatory   walker      reports that he has been smoking cigarettes. He has a 4.8 pack-year smoking history. He has never used smokeless tobacco. He reports current alcohol use of about 1.0 standard drink of alcohol per week. He reports current drug use. Drug: Marijuana.    Family History:   Family History  Problem Relation Age of Onset   Stroke Mother 51   Heart disease Father 51   Colon cancer Neg Hx    Colon polyps Neg Hx    Esophageal cancer Neg Hx    Stomach cancer Neg Hx    Rectal cancer Neg Hx    ______________________________________________________________________________________________ Allergies: No Known Allergies   Prior to Admission medications   Medication Sig Start Date End Date Taking? Authorizing Provider  acetaminophen  (TYLENOL ) 500 MG tablet Take 1,000 mg by mouth every 6 (six) hours as needed for mild pain (pain score 1-3) or moderate pain (pain score 4-6).   Yes [provider]  albuterol  (VENTOLIN  HFA) 108 (90 Base) MCG/ACT inhaler INHALE 1 TO 2 PUFFS BY MOUTH EVERY 6 HOURS AS NEEDED FOR WHEEZING OR SHORTNESS OF BREATH 10/29/22  Yes Elnora Ip, MD  amiodarone  (PACERONE ) 200 MG tablet Take 1 tablet (200 mg total) by mouth daily. 03/09/24  Yes Colletta Manuelita Garre, PA-C  ASPIRIN  LOW DOSE 81 MG tablet TAKE 1 TABLET (81 MG TOTAL) BY MOUTH DAILY. SWALLOW WHOLE(AM) 01/15/24  Yes [place], [employer], FNP  carvedilol  (COREG ) 3.125 MG tablet Take 1 tablet (3.125 mg total) by mouth 2 (two) times daily. 01/29/22 06/16/24 Yes [place], Harlene HERO, FNP  gabapentin  (NEURONTIN ) 600 MG tablet Take 600 mg by mouth 2 (two) times daily.   Yes [provider]  JARDIANCE  10 MG  TABS tablet Take 10 mg by mouth daily. 04/03/23  Yes [provider]  losartan  (COZAAR ) 25 MG tablet Take 25 mg by mouth daily.   Yes [provider]  mexiletine (MEXITIL ) 150 MG capsule TAKE 1 CAPSULE BY MOUTH 2 (TWO) TIMES DAILY (AM+BEDTIME) 06/09/24  Yes Rolan Ezra RAMAN, MD  nortriptyline  (PAMELOR ) 75 MG capsule Take 75 mg by mouth daily. 06/07/24  Yes [provider]  rosuvastatin  (CRESTOR ) 40 MG tablet Take 1 tablet (40 mg total) by mouth daily. 01/22/24  Yes Rolan Ezra RAMAN, MD  spironolactone  (ALDACTONE ) 25 MG tablet Take 1 tablet (25 mg total) by mouth daily. 03/09/24  Yes Colletta Manuelita Garre, PA-C  STIOLTO RESPIMAT 2.5-2.5 MCG/ACT AERS Inhale 2 puffs into the lungs daily. 06/11/23  Yes [provider]  tamsulosin  (FLOMAX ) 0.4 MG CAPS capsule Take 0.4 mg by mouth at bedtime.   Yes [provider]  torsemide  (DEMADEX ) 20 MG tablet TAKE 1 TABLET (20 MG TOTAL) BY MOUTH DAILY (AM) 06/09/24  Yes Rolan Ezra RAMAN, MD  methylPREDNISolone (MEDROL DOSEPAK) 4 MG TBPK tablet Take by mouth as directed. Patient not taking: Reported on 06/16/2024 05/24/24   [provider]  zolpidem  (AMBIEN ) 10 MG tablet Take 1 tablet (10 mg total) by mouth at bedtime as needed for sleep. 09/16/22 01/03/23      ___________________________________________________________________________________________________ Physical Exam:    06/16/2024    2:20  PM 06/16/2024    2:18 PM 04/08/2024    9:04 AM  Vitals with BMI  Height  6' 0 6' 0  Weight  225 lbs 224 lbs  BMI  30.51 30.37  Systolic 105  113  Diastolic 73  76  Pulse 76  65     1. General:  in No  Acute distress   Chronically ill   -appearing 2. Psychological: Alert and   Oriented 3. Head/ENT: Dry Mucous Membranes                          Head Non traumatic, neck supple                          Poor Dentition 4. SKIN: decreased Skin turgor,  Skin clean Dry and intact no rash    5. Heart: Regular rate and rhythm no  Murmur, no Rub or gallop 6. Lungs:  no wheezes or crackles   7. Abdomen: Soft,  non-tender,   distended  bowel sounds present 8. Lower extremities: no clubbing, cyanosis, no  edema 9. Neurologically Grossly intact, moving all 4 extremities equally   10. MSK: Normal range of motion    Chart has been reviewed  ______________________________________________________________________________________________  Assessment/Plan  68 y.o. male with medical history significant of CKD stage III AA, systolic CHF, polysubstance abuse BPH history of V. 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 , CAD status post stent 2023   Admitted for   Ground-level fall  AKI  on CKD   Present on Admission:  AKI (acute kidney injury)  Chronic systolic heart failure (HCC)  CKD (chronic kidney disease) stage 3, GFR 30-59 ml/min (HCC)  COPD (chronic obstructive pulmonary disease) (HCC)  HTN (hypertension)  Hyperlipemia  Lumbar spinal stenosis  Multiple rib fractures  Type 2 diabetes mellitus with stage 3b chronic kidney disease, without long-term current use of insulin  (HCC)  NSVT (nonsustained ventricular tachycardia) (HCC)  Thrombocytopenia  BPH (benign prostatic hyperplasia)  AKI (acute kidney injury) Will rehydrate and hold ARB Obtain urine electrolytes CT abd/pelvis non acute    Chronic systolic heart failure (HCC) Currently appears  euvolemic.  Stable Gentle rehydration given AKI hold torsemide  History of VT continue amiodarone   CKD (chronic kidney disease) stage 3, GFR 30-59 ml/min (HCC)  -chronic avoid nephrotoxic medications such as NSAIDs, Vanco Zosyn combo,  avoid hypotension, continue to follow renal function   COPD (chronic obstructive pulmonary disease) (HCC) Chronic stable continue home medications  HTN (hypertension) Given AKI will hold spironolactone  and losartan  Continue Coreg  3.125 mg p.o. twice daily  Hyperlipemia Continue Crestor  40 mg daily  Lumbar spinal stenosis Chronic stable  Multiple rib fractures Supportive management And insensitive spirometry  Type 2 diabetes mellitus with stage 3b chronic kidney disease, without long-term current use of insulin  (HCC) Order sliding scale  NSVT (nonsustained ventricular tachycardia) (HCC) Continue amiodarone  200mg  po q day  Thrombocytopenia Chronic stable  BPH (benign prostatic hyperplasia) Continue Flomax  0.4 mg at bedtime   Other plan as per orders.  DVT prophylaxis:  SCD    Code Status:    Code Status: Prior FULL CODE as per patient   I had personally discussed CODE STATUS with patient   ACP   none   Family Communication:   Family not at  Bedside    Diet  Diet Orders (From admission, onward)     Start     Ordered   06/16/24 2207  Diet heart healthy/carb modified Room service appropriate? Yes; Fluid consistency: Thin  Diet effective now       Question Answer Comment  Diet-HS Snack? Nothing   Room service appropriate? Yes   Fluid consistency: Thin      06/16/24 2206            Disposition Plan:    To home once workup is complete and patient is stable   Following barriers for discharge:                                                       Electrolytes corrected                                                    Would benefit from PT/OT eval prior to DC  Ordered                                       Consults  called:    NONE   Admission status:  ED Disposition     ED Disposition  Admit   Condition  --   Comment  Hospital Area: [facility] [[id number]]  Level of Care: Telemetry [5]  Diagnosis: AKI (acute kidney injury) [309830]  Admitting Physician: Elenie Coven [[id number]]  Attending Physician: Krisanne Lich [[id number]]           Obs      Level of care     tele  For 12H       Montague Corella 06/16/2024, 10:06 PM    [facility]     after 2 AM please page floor  coverage   If 7AM-7PM, please contact the day team taking care of the patient using [email or web address]

## 2024-06-16 NOTE — Assessment & Plan Note (Signed)
 Chronic stable

## 2024-06-16 NOTE — Assessment & Plan Note (Signed)
 Chronic stable continue home medications ?

## 2024-06-16 NOTE — Assessment & Plan Note (Signed)
 Supportive management And insensitive spirometry

## 2024-06-16 NOTE — ED Provider Notes (Signed)
 [place] EMERGENCY DEPARTMENT AT [facility] Provider Note   CSN: [id number] Arrival date & time: 06/16/24  1417     History No chief complaint on file.  HPI: Edward Mullen is a 68 y.o. male with history perinent for HFrEF (EF <20% 7/24) with ICD in place, out of hospital arrest 4/24 due to sustained ventricular tachycardia, CKD stage 3a, HTN, T2DM, hyperlipidemia, who presents complaining of fall with left-sided chest wall pain and abdominal pain.. Patient arrived via EMS.  History provided by patient.  No interpreter required during this encounter.  Patient presents to the emergency department status post fall.  Reports that this morning he was caring for his grandchildren when he tripped over one of their toys and fell and hit his left arm and left side on the arm of the couch.  Denies hitting his head, loss of consciousness.  Reports that he was able to get up on his own, continue caring for the children, however he had persistent pain throughout the day therefore he decided to come to the emergency department for further evaluation.  Denies use of anticoagulants, neck pain, headache, endorses left-sided abdominal pain, left-sided chest wall pain.  Prior to Admission medications   Medication Sig Start Date End Date Taking? Authorizing Provider  acetaminophen  (TYLENOL ) 500 MG tablet Take 1,000 mg by mouth every 6 (six) hours as needed for mild pain (pain score 1-3) or moderate pain (pain score 4-6).   Yes [provider]  albuterol  (VENTOLIN  HFA) 108 (90 Base) MCG/ACT inhaler INHALE 1 TO 2 PUFFS BY MOUTH EVERY 6 HOURS AS NEEDED FOR WHEEZING OR SHORTNESS OF BREATH 10/29/22  Yes Elnora Ip, MD  amiodarone  (PACERONE ) 200 MG tablet Take 1 tablet (200 mg total) by mouth daily. 03/09/24  Yes Colletta Manuelita Garre, PA-C  ASPIRIN  LOW DOSE 81 MG tablet TAKE 1 TABLET (81 MG TOTAL) BY MOUTH DAILY. SWALLOW WHOLE(AM) 01/15/24  Yes Milford, Harlene HERO, FNP  carvedilol   (COREG ) 3.125 MG tablet Take 1 tablet (3.125 mg total) by mouth 2 (two) times daily. 01/29/22 06/16/24 Yes Milford, Harlene HERO, FNP  gabapentin  (NEURONTIN ) 600 MG tablet Take 600 mg by mouth 2 (two) times daily.   Yes [provider]  JARDIANCE  10 MG TABS tablet Take 10 mg by mouth daily. 04/03/23  Yes [provider]  losartan  (COZAAR ) 25 MG tablet Take 25 mg by mouth daily.   Yes [provider]  mexiletine (MEXITIL ) 150 MG capsule TAKE 1 CAPSULE BY MOUTH 2 (TWO) TIMES DAILY (AM+BEDTIME) 06/09/24  Yes Rolan Ezra RAMAN, MD  nortriptyline  (PAMELOR ) 75 MG capsule Take 75 mg by mouth daily. 06/07/24  Yes [provider]  rosuvastatin  (CRESTOR ) 40 MG tablet Take 1 tablet (40 mg total) by mouth daily. 01/22/24  Yes Rolan Ezra RAMAN, MD  senna (SENOKOT) 8.6 MG TABS tablet Take 2 tablets (17.2 mg total) by mouth at bedtime. 06/18/24  Yes Amin, Ankit C, MD  spironolactone  (ALDACTONE ) 25 MG tablet Take 1 tablet (25 mg total) by mouth daily. 03/09/24  Yes Colletta Manuelita Garre, PA-C  STIOLTO RESPIMAT 2.5-2.5 MCG/ACT AERS Inhale 2 puffs into the lungs daily. 06/11/23  Yes [provider]  tamsulosin  (FLOMAX ) 0.4 MG CAPS capsule Take 0.4 mg by mouth at bedtime.   Yes [provider]  torsemide  (DEMADEX ) 20 MG tablet TAKE 1 TABLET (20 MG TOTAL) BY MOUTH DAILY (AM) 06/09/24  Yes Rolan Ezra RAMAN, MD  HYDROcodone -acetaminophen  (NORCO/VICODIN) 5-325 MG tablet Take 1-2 tablets by mouth  every 6 (six) hours as needed for severe pain (pain score 7-10). 06/18/24   Amin, Ankit C, MD  lidocaine  (LIDODERM ) 5 % Place 2 patches onto the skin daily. Remove & Discard patch within 12 hours or as directed by MD.  Apply to the left rib area 06/18/24   Amin, Ankit C, MD  polyethylene glycol powder (GLYCOLAX /MIRALAX ) 17 GM/SCOOP powder Take 17 g by mouth 2 (two) times daily as needed for moderate constipation or severe constipation. Dissolve 1 capful (17g) in 4-8 ounces of liquid and take  by mouth daily. 06/18/24   Amin, Ankit C, MD  zolpidem  (AMBIEN ) 10 MG tablet Take 1 tablet (10 mg total) by mouth at bedtime as needed for sleep. 09/16/22 01/03/23       Allergies: Patient has no known allergies.   Review of Systems   ROS as per HPI  Physical Exam Updated Vital Signs BP (!) 138/92 (BP Location: Right Arm)   Pulse 78   Temp 98.3 F (36.8 C)   Resp 18   Ht 6' (1.829 m)   Wt 107.1 kg   SpO2 98%   BMI 32.02 kg/m  Physical Exam Vitals and nursing note reviewed.  Constitutional:      General: He is not in acute distress.    Appearance: He is well-developed.  HENT:     Head: Normocephalic and atraumatic.  Eyes:     Conjunctiva/sclera: Conjunctivae normal.  Cardiovascular:     Rate and Rhythm: Normal rate and regular rhythm.     Heart sounds: No murmur heard. Pulmonary:     Effort: Pulmonary effort is normal. No respiratory distress.     Breath sounds: Normal breath sounds.  Abdominal:     Palpations: Abdomen is soft.     Tenderness: There is abdominal tenderness (Diffusely).  Musculoskeletal:        General: No swelling.     Cervical back: Neck supple. No bony tenderness.     Thoracic back: Bony tenderness present.     Lumbar back: No bony tenderness.     Comments: Chest wall stable, nontender to palpation  Skin:    General: Skin is warm and dry.     Capillary Refill: Capillary refill takes less than 2 seconds.  Neurological:     Mental Status: He is alert.  Psychiatric:        Mood and Affect: Mood normal.     ED Course/ Medical Decision Making/ A&P    Procedures Procedures   Medications Ordered in ED Medications  0.9 %  sodium chloride  infusion (0 mLs Intravenous Stopped 06/18/24 0922)  tiZANidine (ZANAFLEX) tablet 2 mg (2 mg Oral Given 06/16/24 1536)  oxyCODONE  (Oxy IR/ROXICODONE ) immediate release tablet 2.5 mg (2.5 mg Oral Given 06/16/24 1536)  acetaminophen  (TYLENOL ) tablet 1,000 mg (1,000 mg Oral Given 06/16/24 1536)  lactated ringers  bolus  500 mL (0 mLs Intravenous Stopped 06/16/24 2101)  oxyCODONE -acetaminophen  (PERCOCET/ROXICET) 5-325 MG per tablet 1 tablet (1 tablet Oral Given 06/16/24 2205)  lactulose (CHRONULAC) 10 GM/15ML solution 30 g (30 g Oral Given 06/17/24 1530)    Medical Decision Making:   GOBLE FUDALA is a 68 y.o. male who presents for fall as per above.  Physical exam is pertinent for diffuse abdominal tenderness to palpation, tenderness to C-spine, left-sided chest wall tenderness.  The differential includes but is not limited to ICH, TBI, skull fracture, spinal fracture/dislocation, blunt thoracic trauma, hemothorax, pneumothorax, rib fractures, blunt abdominal trauma, hemorrhage, extremity fracture, dislocation.  Independent historian:  None  External data reviewed: Labs: reviewed prior labs for baseline  Labs: Ordered and Independent interpretation CBC: No leukocytosis or anemia, similar thrombocytopenia on comparison to prior CMP: AKI to 2.35 from baseline of 1.5-1.7 with symmetric elevation of BUN.  No emergent electrolyte derangement or emergent LFT abnormality, mild diminishment of bicarb, which patient previously has demonstrated.  Radiology: Ordered, Independent interpretation, and All images reviewed independently.  Agree with radiology report at this time.   CT head: No ICH or displaced skull fracture CT C-spine: No displaced fracture or dislocation CT T/L-spine: No displaced fracture or dislocation CT CAP: No pneumothorax, hemothorax, intra-abdominal free air, significant intra-abdominal free fluid, overt bony displacement XR left shoulder: No displaced fracture or dislocation CT L-SPINE NO CHARGE Result Date: 06/16/2024 EXAM: CT OF THE LUMBAR SPINE WITHOUT CONTRAST 06/16/2024 05:42:00 PM TECHNIQUE: CT of the lumbar spine was performed without the administration of intravenous contrast. Multiplanar reformatted images are provided for review. Automated exposure control, iterative reconstruction,  and/or weight based adjustment of the mA/kV was utilized to reduce the radiation dose to as low as reasonably achievable. COMPARISON: 06/10/2019 CLINICAL HISTORY: FINDINGS: BONES AND ALIGNMENT: Normal vertebral body heights. Posterior fusion changes from L3 to S1. Posterior left 10th rib fracture noted. Normal alignment. DEGENERATIVE CHANGES: Spurring in the lower thoracic and upper lumbar spine. SOFT TISSUES: No acute abnormality. IMPRESSION: 1. Posterior left 10th rib fracture. 2. Postoperative and degenerative changes in the lumbar spine. Electronically signed by: Franky Crease MD 06/16/2024 06:14 PM EST RP Workstation: [id number]   CT T-SPINE NO CHARGE Result Date: 06/16/2024 EXAM: CT THORACIC SPINE WITHOUT CONTRAST 06/16/2024 05:42:00 PM TECHNIQUE: CT of the thoracic spine was performed without the administration of intravenous contrast. Multiplanar reformatted images are provided for review. Automated exposure control, iterative reconstruction, and/or weight based adjustment of the mA/kV was utilized to reduce the radiation dose to as low as reasonably achievable. COMPARISON: Comparison to today's CT chest, abdomen, and pelvis. CLINICAL HISTORY: FINDINGS: BONES AND ALIGNMENT: Normal vertebral body heights. No acute fracture or suspicious bone lesion. Normal alignment. DEGENERATIVE CHANGES: Flowing anterior and right lateral osteophytes. SOFT TISSUES: No acute abnormality. IMPRESSION: 1. No acute abnormality of the thoracic spine. Electronically signed by: Franky Crease MD 06/16/2024 06:12 PM EST RP Workstation: [id number]   CT CHEST ABDOMEN PELVIS WO CONTRAST Result Date: 06/16/2024 EXAM: CT CHEST, ABDOMEN AND PELVIS WITHOUT CONTRAST 06/16/2024 05:42:00 PM TECHNIQUE: CT of the chest, abdomen and pelvis was performed without the administration of intravenous contrast. Multiplanar reformatted images are provided for review. Automated exposure control, iterative reconstruction, and/or weight based adjustment of  the mA/kV was utilized to reduce the radiation dose to as low as reasonably achievable. COMPARISON: 07/07/2023 CLINICAL HISTORY: Polytrauma, blunt; fall on to left side, diffuse abdominal pain. FINDINGS: CHEST: MEDIASTINUM AND LYMPH NODES: Pacer in place with leads in the right heart. Coronary artery and aortic atherosclerosis. The central airways are clear. No mediastinal, hilar or axillary lymphadenopathy. LUNGS AND PLEURA: Trace left pleural effusion and left base atelectasis. No focal consolidation or pulmonary edema. No pneumothorax. ABDOMEN AND PELVIS: LIVER: The liver is unremarkable. GALLBLADDER AND BILE DUCTS: Gallbladder is unremarkable. No biliary ductal dilatation. SPLEEN: No acute abnormality. PANCREAS: No acute abnormality. ADRENAL GLANDS: No acute abnormality. KIDNEYS, URETERS AND BLADDER: No stones in the kidneys or ureters. No hydronephrosis. No perinephric or periureteral stranding. Urinary bladder is unremarkable. GI AND BOWEL: Stomach demonstrates no acute abnormality. Diverticulosis. There is no bowel obstruction. APPENDIX: Normal appendix. REPRODUCTIVE ORGANS: Prostate enlargement. PERITONEUM AND  RETROPERITONEUM: No ascites. No free air. VASCULATURE: Aorta is normal in caliber. Aortic atherosclerosis. ABDOMINAL AND PELVIS LYMPH NODES: No lymphadenopathy. BONES AND SOFT TISSUES: Posterior left 9th and 10th rib fractures. Postoperative and degenerative changes in the lumbar spine. No focal soft tissue abnormality. IMPRESSION: 1. Posterior left 9th and 10th rib fractures. 2. Trace left pleural effusion with left basilar atelectasis. 3. No acute findings in the abdomen or pelvis. 4. Scattered colonic diverticulosis. 5. Prostate enlargement. Electronically signed by: Franky Crease MD 06/16/2024 06:10 PM EST RP Workstation: [id number]   CT HEAD WO CONTRAST Result Date: 06/16/2024 CLINICAL DATA:  Moderate head/neck trauma from fall. EXAM: CT HEAD WITHOUT CONTRAST CT CERVICAL SPINE WITHOUT CONTRAST  TECHNIQUE: Multidetector CT imaging of the head and cervical spine was performed following the standard protocol without intravenous contrast. Multiplanar CT image reconstructions of the cervical spine were also generated. RADIATION DOSE REDUCTION: This exam was performed according to the departmental dose-optimization program which includes automated exposure control, adjustment of the mA and/or kV according to patient size and/or use of iterative reconstruction technique. COMPARISON:  Head CT 12/28/2023 FINDINGS: CT HEAD FINDINGS Brain: Ventricles, cisterns and other CSF spaces are normal. There is no mass, mass effect, shift of midline structures or acute hemorrhage. Mild chronic ischemic microvascular disease is present. Vascular: No hyperdense vessel or unexpected calcification. Skull: Normal. Negative for fracture or focal lesion. Sinuses/Orbits: No acute finding. Other: None. CT CERVICAL SPINE FINDINGS Alignment: Normal. Skull base and vertebrae: Moderate spondylosis throughout the cervical spine to include uncovertebral joint spurring and facet arthropathy. Vertebral body heights are maintained. Atlantoaxial articulation is unremarkable. No acute fracture. Bilateral neural foraminal narrowing at the C4-5 level right worse than left. Bilateral neural foraminal narrowing at the C5-6 and C6-7 levels. Soft tissues and spinal canal: Minimal canal stenosis at the C6-7 level and to lesser extent at the C5-6 and C2-3 levels. Prevertebral soft tissues are normal. Disc levels: Moderate disc space narrowing at the C5-6 and C6-7 levels and to lesser extent at the C4-5 level. Upper chest: No acute findings. Other: None. IMPRESSION: 1. No acute brain injury. 2. Mild chronic ischemic microvascular disease. 3. No acute cervical spine injury. 4. Moderate spondylosis throughout the cervical spine with multilevel disc disease, multilevel neural foraminal narrowing and mild canal stenosis as described. Electronically Signed    By: Toribio Agreste M.D.   On: 06/16/2024 16:58   CT CERVICAL SPINE WO CONTRAST Result Date: 06/16/2024 CLINICAL DATA:  Moderate head/neck trauma from fall. EXAM: CT HEAD WITHOUT CONTRAST CT CERVICAL SPINE WITHOUT CONTRAST TECHNIQUE: Multidetector CT imaging of the head and cervical spine was performed following the standard protocol without intravenous contrast. Multiplanar CT image reconstructions of the cervical spine were also generated. RADIATION DOSE REDUCTION: This exam was performed according to the departmental dose-optimization program which includes automated exposure control, adjustment of the mA and/or kV according to patient size and/or use of iterative reconstruction technique. COMPARISON:  Head CT 12/28/2023 FINDINGS: CT HEAD FINDINGS Brain: Ventricles, cisterns and other CSF spaces are normal. There is no mass, mass effect, shift of midline structures or acute hemorrhage. Mild chronic ischemic microvascular disease is present. Vascular: No hyperdense vessel or unexpected calcification. Skull: Normal. Negative for fracture or focal lesion. Sinuses/Orbits: No acute finding. Other: None. CT CERVICAL SPINE FINDINGS Alignment: Normal. Skull base and vertebrae: Moderate spondylosis throughout the cervical spine to include uncovertebral joint spurring and facet arthropathy. Vertebral body heights are maintained. Atlantoaxial articulation is unremarkable. No acute fracture. Bilateral neural foraminal narrowing  at the C4-5 level right worse than left. Bilateral neural foraminal narrowing at the C5-6 and C6-7 levels. Soft tissues and spinal canal: Minimal canal stenosis at the C6-7 level and to lesser extent at the C5-6 and C2-3 levels. Prevertebral soft tissues are normal. Disc levels: Moderate disc space narrowing at the C5-6 and C6-7 levels and to lesser extent at the C4-5 level. Upper chest: No acute findings. Other: None. IMPRESSION: 1. No acute brain injury. 2. Mild chronic ischemic microvascular  disease. 3. No acute cervical spine injury. 4. Moderate spondylosis throughout the cervical spine with multilevel disc disease, multilevel neural foraminal narrowing and mild canal stenosis as described. Electronically Signed   By: Toribio Agreste M.D.   On: 06/16/2024 16:58   DG Shoulder Left Result Date: 06/16/2024 CLINICAL DATA:  Left shoulder pain post fall. EXAM: LEFT SHOULDER - 2+ VIEW COMPARISON:  None Available. FINDINGS: Mild degenerate change over the [facility] joint and glenohumeral joints. No evidence of acute fracture or dislocation. IMPRESSION: 1. No acute findings. 2. Mild degenerative changes. Electronically Signed   By: Toribio Agreste M.D.   On: 06/16/2024 16:49    EKG/Medicine tests: Not indicated                Interventions: Tylenol , lidocaine  patch, oxycodone , tizanidine, LR bolus  See the EMR for full details regarding lab and imaging results.  Currently, patient is awake, alert, and protecting own airway and is hemodynamically stable.  Patient is overall well-appearing on exam, hemodynamically stable, however does have significant tenderness to palpation diffusely in abdomen, as well as significant left-sided chest wall pain without instability, therefore do feel that CT imaging is warranted to evaluate for blunt intra-abdominal trauma.  Additionally patient is at risk per the [place] CT head and CT C-spine rule, therefore will obtain screening images of these areas.  Will obtain labs including CBC and CMP given contrasted scan would be more accurate for intra-abdominal trauma.  Additionally will treat patient with multimodal pain control.  Patient with improvement in symptoms after multimodal pain therapy.  Unfortunately patient has AKI on CKD, therefore noncontrasted scan obtained, and revealing only of posterior left 9th and 10th rib fractures, consistent with patient's left-sided chest wall pain.  Do feel that patient warrants admission for further evaluation of AKI as well as pain  control.  Medicine consulted for admission, accepted by Dr. Silvester to hospitalist service, no additional acute events while patient was under my care  Presentation is most consistent with acute complicated illness  Discussion of management or test interpretations with external provider(s): Dr. Silvester, hospitalists  Risk Drugs:OTC drugs and Prescription drug management Treatment: Decision regarding hospitalization  Disposition: ADMIT: I believe the patient requires admission for further care and management. The patient was admitted to hospitalists. Please see inpatient provider note for additional treatment plan details.   MDM generated using voice dictation software and may contain dictation errors.  Please contact me for any clarification or with any questions.  Clinical Impression:  1. Ground-level fall   2. AKI (acute kidney injury)   3. Bilateral lower extremity pain   4. First time seizure (HCC)   5. Closed fracture of multiple ribs of left side, initial encounter      Admit   Final Clinical Impression(s) / ED Diagnoses Final diagnoses:  Ground-level fall  AKI (acute kidney injury)  Closed fracture of multiple ribs of left side, initial encounter    Rx / DC Orders ED Discharge Orders  Ordered    HYDROcodone -acetaminophen  (NORCO/VICODIN) 5-325 MG tablet  Every 6 hours PRN        06/18/24 0801    polyethylene glycol powder (GLYCOLAX /MIRALAX ) 17 GM/SCOOP powder  2 times daily PRN        06/18/24 0801    senna (SENOKOT) 8.6 MG TABS tablet  Daily at bedtime        06/18/24 0801    lidocaine  (LIDODERM ) 5 %  Every 24 hours        06/18/24 0804    Ambulatory referral to Physical Therapy       Comments: OP PT for balance   06/17/24 1445             Rogelia Jerilynn RAMAN, MD 06/23/24 0840

## 2024-06-16 NOTE — ED Notes (Signed)
 Pt voided prior to bladder scan and urine sample being ordered, pt denies he does not need to urinate at this time but will try again later. IP RN aware, transport called and IP RN notified.

## 2024-06-16 NOTE — ED Triage Notes (Signed)
 Pt fell 3.5 hours ago, tripped over toys. Pt hit left arm on side of couch. No blood thinners, no LOC, did not hit head. Axox4. C/O left ribcage pain.

## 2024-06-16 NOTE — Assessment & Plan Note (Signed)
 Currently appears euvolemic.  Stable Gentle rehydration given AKI hold torsemide  History of VT continue amiodarone 

## 2024-06-16 NOTE — Assessment & Plan Note (Signed)
-  chronic avoid nephrotoxic medications such as NSAIDs, Vanco Zosyn combo,  avoid hypotension, continue to follow renal function

## 2024-06-16 NOTE — Subjective & Objective (Signed)
 Tripped over his grand kids toy falling on left side CT found 9th and 10th rib fractures Noted to have worsening renal function

## 2024-06-16 NOTE — Assessment & Plan Note (Signed)
Order sliding scale  

## 2024-06-16 NOTE — Assessment & Plan Note (Signed)
 Continue Crestor 40 mg daily

## 2024-06-16 NOTE — Assessment & Plan Note (Signed)
 Given AKI will hold spironolactone  and losartan  Continue Coreg  3.125 mg p.o. twice daily

## 2024-06-16 NOTE — ED Provider Triage Note (Signed)
 Emergency Medicine Provider Triage Evaluation Note  Edward Mullen , a 68 y.o. male  was evaluated in triage.  Pt complains of mechanical fall after tripping on grand kids toys earlier today approximately 3-4 hours ago. Complaining of L shoulder pain, L sided chest pain and L sided abdominal pain  Denies hitting head, LOC, blood thinners. Was able to ambulate after fall.   Denies headaches, neck pain, vision changes, shortness of breath, n/v, numbness, weakness, tingling.   Review of Systems  Positive: N/a Negative: N/a  Physical Exam  BP 105/73 (BP Location: Right Arm)   Pulse 76   Temp 97.9 F (36.6 C)   Resp 16   Ht 6' (1.829 m)   Wt 102.1 kg   SpO2 100%   BMI 30.52 kg/m  Gen:   Awake, no distress   Resp:  Normal effort  MSK:   Moves extremities without difficulty  Other:    Medical Decision Making  Medically screening exam initiated at 3:03 PM.  Appropriate orders placed.  Edward Mullen was informed that the remainder of the evaluation will be completed by another provider, this initial triage assessment does not replace that evaluation, and the importance of remaining in the ED until their evaluation is complete.     Edward Mullen, [place] 06/16/24 8493

## 2024-06-16 NOTE — Assessment & Plan Note (Addendum)
 Will rehydrate and hold ARB Obtain urine electrolytes CT abd/pelvis non acute

## 2024-06-16 NOTE — Assessment & Plan Note (Signed)
Continue Flomax 0.4 mg at bedtime.

## 2024-06-17 DIAGNOSIS — N179 Acute kidney failure, unspecified: Secondary | ICD-10-CM | POA: Diagnosis not present

## 2024-06-17 DIAGNOSIS — S2242XA Multiple fractures of ribs, left side, initial encounter for closed fracture: Secondary | ICD-10-CM | POA: Diagnosis not present

## 2024-06-17 LAB — GLUCOSE, CAPILLARY
Glucose-Capillary: 114 mg/dL — ABNORMAL HIGH (ref 70–99)
Glucose-Capillary: 124 mg/dL — ABNORMAL HIGH (ref 70–99)
Glucose-Capillary: 137 mg/dL — ABNORMAL HIGH (ref 70–99)
Glucose-Capillary: 79 mg/dL (ref 70–99)
Glucose-Capillary: 86 mg/dL (ref 70–99)
Glucose-Capillary: 95 mg/dL (ref 70–99)

## 2024-06-17 LAB — CBC
HCT: 40.1 % (ref 39.0–52.0)
Hemoglobin: 12.8 g/dL — ABNORMAL LOW (ref 13.0–17.0)
MCH: 28.1 pg (ref 26.0–34.0)
MCHC: 31.9 g/dL (ref 30.0–36.0)
MCV: 87.9 fL (ref 80.0–100.0)
Platelets: 81 K/uL — ABNORMAL LOW (ref 150–400)
RBC: 4.56 MIL/uL (ref 4.22–5.81)
RDW: 16.8 % — ABNORMAL HIGH (ref 11.5–15.5)
WBC: 3.9 K/uL — ABNORMAL LOW (ref 4.0–10.5)
nRBC: 0 % (ref 0.0–0.2)

## 2024-06-17 LAB — URINALYSIS, COMPLETE (UACMP) WITH MICROSCOPIC
Bacteria, UA: NONE SEEN
Bilirubin Urine: NEGATIVE
Glucose, UA: >=500 mg/dL — AB
Ketones, ur: NEGATIVE mg/dL
Leukocytes,Ua: NEGATIVE
Nitrite: NEGATIVE
Protein, ur: NEGATIVE mg/dL
Specific Gravity, Urine: 1.009 (ref 1.005–1.030)
pH: 6 (ref 5.0–8.0)

## 2024-06-17 LAB — COMPREHENSIVE METABOLIC PANEL WITH GFR
ALT: 26 U/L (ref 0–44)
AST: 28 U/L (ref 15–41)
Albumin: 3.1 g/dL — ABNORMAL LOW (ref 3.5–5.0)
Alkaline Phosphatase: 117 U/L (ref 38–126)
Anion gap: 8 (ref 5–15)
BUN: 27 mg/dL — ABNORMAL HIGH (ref 8–23)
CO2: 24 mmol/L (ref 22–32)
Calcium: 8.7 mg/dL — ABNORMAL LOW (ref 8.9–10.3)
Chloride: 104 mmol/L (ref 98–111)
Creatinine, Ser: 2.07 mg/dL — ABNORMAL HIGH (ref 0.61–1.24)
GFR, Estimated: 34 mL/min — ABNORMAL LOW (ref >60)
Glucose, Bld: 85 mg/dL (ref 70–99)
Potassium: 4.2 mmol/L (ref 3.5–5.1)
Sodium: 136 mmol/L (ref 135–145)
Total Bilirubin: 0.6 mg/dL (ref 0.0–1.2)
Total Protein: 7.5 g/dL (ref 6.5–8.1)

## 2024-06-17 LAB — RAPID URINE DRUG SCREEN, HOSP PERFORMED
Amphetamines: NOT DETECTED
Barbiturates: NOT DETECTED
Benzodiazepines: NOT DETECTED
Cocaine: POSITIVE — AB
Opiates: NOT DETECTED
Tetrahydrocannabinol: POSITIVE — AB

## 2024-06-17 LAB — PHOSPHORUS: Phosphorus: 2.8 mg/dL (ref 2.5–4.6)

## 2024-06-17 LAB — OSMOLALITY, URINE: Osmolality, Ur: 389 mosm/kg (ref 300–900)

## 2024-06-17 LAB — CREATININE, URINE, RANDOM: Creatinine, Urine: 52 mg/dL

## 2024-06-17 LAB — MAGNESIUM: Magnesium: 2.2 mg/dL (ref 1.7–2.4)

## 2024-06-17 LAB — PREALBUMIN: Prealbumin: 23 mg/dL (ref 18–38)

## 2024-06-17 LAB — SODIUM, URINE, RANDOM: Sodium, Ur: 79 mmol/L

## 2024-06-17 LAB — ETHANOL: Alcohol, Ethyl (B): <15 mg/dL (ref <15)

## 2024-06-17 MED ORDER — HYDROCORTISONE 1 % EX CREA: 1.0000 | TOPICAL_CREAM | Freq: Three times a day (TID) | CUTANEOUS | Status: DC | PRN

## 2024-06-17 MED ORDER — LACTULOSE 10 GM/15ML PO SOLN
30.0000 g | ORAL | Status: AC
  Administered 2024-06-17 (×2): 30 g via ORAL
  Filled 2024-06-17 (×2): qty 45

## 2024-06-17 MED ORDER — ENSURE PLUS HIGH PROTEIN PO LIQD
237.0000 mL | Freq: Two times a day (BID) | ORAL | Status: DC
  Administered 2024-06-17 – 2024-06-18 (×2): 237 mL via ORAL

## 2024-06-17 MED ORDER — ALUM & MAG HYDROXIDE-SIMETH 200-200-20 MG/5ML PO SUSP: 30.0000 mL | ORAL | Status: DC | PRN

## 2024-06-17 MED ORDER — PHENOL 1.4 % MT LIQD: 1.0000 | OROMUCOSAL | Status: DC | PRN

## 2024-06-17 MED ORDER — POLYVINYL ALCOHOL 1.4 % OP SOLN: 1.0000 [drp] | OPHTHALMIC | Status: DC | PRN

## 2024-06-17 MED ORDER — MUSCLE RUB 10-15 % EX CREA: 1.0000 | TOPICAL_CREAM | CUTANEOUS | Status: DC | PRN

## 2024-06-17 MED ORDER — HYDRALAZINE HCL 20 MG/ML IJ SOLN: 10.0000 mg | INTRAMUSCULAR | Status: DC | PRN

## 2024-06-17 MED ORDER — LORATADINE 10 MG PO TABS
10.0000 mg | ORAL_TABLET | Freq: Every day | ORAL | Status: DC | PRN
Start: 2024-06-17 — End: 2024-06-18

## 2024-06-17 MED ORDER — METOPROLOL TARTRATE 5 MG/5ML IV SOLN: 5.0000 mg | INTRAVENOUS | Status: DC | PRN

## 2024-06-17 MED ORDER — POLYETHYLENE GLYCOL 3350 17 G PO PACK
17.0000 g | PACK | Freq: Two times a day (BID) | ORAL | Status: DC
  Administered 2024-06-17 – 2024-06-18 (×2): 17 g via ORAL
  Filled 2024-06-17 (×2): qty 1

## 2024-06-17 MED ORDER — GLUCAGON HCL RDNA (DIAGNOSTIC) 1 MG IJ SOLR: 1.0000 mg | INTRAMUSCULAR | Status: DC | PRN

## 2024-06-17 MED ORDER — HYDROCORTISONE (PERIANAL) 2.5 % EX CREA: 1.0000 | TOPICAL_CREAM | Freq: Four times a day (QID) | CUTANEOUS | Status: DC | PRN

## 2024-06-17 MED ORDER — SALINE SPRAY 0.65 % NA SOLN: 1.0000 | NASAL | Status: DC | PRN

## 2024-06-17 NOTE — Evaluation (Signed)
 Occupational Therapy Evaluation Patient Details Name: Edward Mullen MRN: [id number] DOB: [birth year] Today's Date: 06/17/2024   History of Present Illness   The pt is a 68yo male presenting 11/5 after a fall. Work up revealed 9th and 10th rib fx and AKI. PMH includes: CKD III, CHF, polysubstance abuse, BPH, vtach, arrest requiring ICD placement, CAD s/p stent placement.     Clinical Impressions Patient admitted for the diagnosis above.  PTA he lives at home with his daughter and her two grown children.  At baseline he generally uses his cane for in home mobility, cares for his own self care, needs assist for iADL and meals, and will need occasional assist for sit to stand.  Currently with rib pain, he is close supervision to CGA for mobility, and Min A for lower body ADL.  Would expect a return to baseline as pain subsides.  OT will follow in the acute setting to address deficits.  No post acute OT is anticipated.       If plan is discharge home, recommend the following:   Assist for transportation;Assistance with cooking/housework;A little help with bathing/dressing/bathroom     Functional Status Assessment   Patient has had a recent decline in their functional status and demonstrates the ability to make significant improvements in function in a reasonable and predictable amount of time.     Equipment Recommendations   None recommended by OT     Recommendations for Other Services         Precautions/Restrictions   Precautions Precautions: Fall Recall of Precautions/Restrictions: Intact Restrictions Weight Bearing Restrictions Per Provider Order: No     Mobility Bed Mobility Overal bed mobility: Needs Assistance Bed Mobility: Supine to Sit     Supine to sit: Supervision, HOB elevated          Transfers Overall transfer level: Needs assistance   Transfers: Sit to/from Stand, Bed to chair/wheelchair/BSC Sit to Stand: Contact guard assist     Step  pivot transfers: Supervision, Contact guard assist            Balance Overall balance assessment: Needs assistance Sitting-balance support: Feet supported Sitting balance-Leahy Scale: Good     Standing balance support: Single extremity supported Standing balance-Leahy Scale: Poor                             ADL either performed or assessed with clinical judgement   ADL       Grooming: Supervision/safety;Sitting               Lower Body Dressing: Minimal assistance;Sit to/from stand   Toilet Transfer: Supervision/safety;Contact guard Actor;Ambulation                   Vision Patient Visual Report: No change from baseline       Perception Perception: Not tested       Praxis Praxis: Not tested       Pertinent Vitals/Pain Pain Assessment Pain Assessment: Faces Faces Pain Scale: Hurts little more Pain Location: L ribs Pain Descriptors / Indicators: Grimacing Pain Intervention(s): Monitored during session     Extremity/Trunk Assessment Upper Extremity Assessment Upper Extremity Assessment: Overall WFL for tasks assessed   Lower Extremity Assessment Lower Extremity Assessment: Defer to PT evaluation   Cervical / Trunk Assessment Cervical / Trunk Assessment: Normal   Communication Communication Communication: No apparent difficulties   Cognition Arousal: Alert Behavior During Therapy: [facility] for tasks assessed/performed  Cognition: No apparent impairments                               Following commands: Intact       Cueing  General Comments   Cueing Techniques: Verbal cues      Exercises     Shoulder Instructions      Home Living Family/patient expects to be discharged to:: Private residence Living Arrangements: Children Available Help at Discharge: Family;Available 24 hours/day Type of Home: House Home Access: Stairs to enter [employer] of Steps: 5 Entrance Stairs-Rails:  Right;Left Home Layout: One level     Bathroom Shower/Tub: Engineer, [employer] Accessibility: Yes How Accessible: Accessible via walker Home Equipment: Rolling Walker (2 wheels);Cane - single point;Educational Psychologist (4 wheels)   Additional Comments: lives with daughter      Prior Functioning/Environment Prior Level of Function : Independent/Modified Independent             Mobility Comments: using cane or rolling walker for ambulation.  Occasional assist for sit to stand from lower surfaces. ADLs Comments: dtr assists as needed, family cooks for him    OT Problem List: Decreased activity tolerance;Impaired balance (sitting and/or standing);Pain   OT Treatment/Interventions: Self-care/ADL training;Therapeutic activities;Balance training;DME and/or AE instruction      OT Goals(Current goals can be found in the care plan section)   Acute Rehab OT Goals Patient Stated Goal: Return home OT Goal Formulation: With patient Time For Goal Achievement: 07/01/24 Potential to Achieve Goals: Good ADL Goals Pt Will Perform Grooming: with modified independence;standing Pt Will Perform Lower Body Dressing: with modified independence;sit to/from stand Pt Will Transfer to Toilet: with modified independence;ambulating;regular height toilet   OT Frequency:  Min 2X/week    Co-evaluation              AM-PAC OT 6 Clicks Daily Activity     Outcome Measure Help from another person eating meals?: None Help from another person taking care of personal grooming?: A Little Help from another person toileting, which includes using toliet, bedpan, or urinal?: A Little Help from another person bathing (including washing, rinsing, drying)?: A Little Help from another person to put on and taking off regular upper body clothing?: A Little Help from another person to put on and taking off regular lower body clothing?: A Little 6 Click Score: 19   End of Session Nurse  Communication: Mobility status  Activity Tolerance: Patient tolerated treatment well Patient left: in chair;with call bell/phone within reach;with nursing/sitter in room  OT Visit Diagnosis: Unsteadiness on feet (R26.81);Pain                Time: 9183-9161 OT Time Calculation (min): 22 min Charges:  OT General Charges $OT Visit: 1 Visit OT Evaluation $OT Eval Moderate Complexity: 1 Mod  06/17/2024  RP, OTR/L  Acute Rehabilitation Services  Office:  [phone]   Charlie JONETTA Halsted 06/17/2024, 9:20 AM

## 2024-06-17 NOTE — Care Management Obs Status (Signed)
 MEDICARE OBSERVATION STATUS NOTIFICATION   Patient Details  Name: Edward Mullen MRN: [id number] Date of Birth: 1956-02-05   Medicare Observation Status Notification Given:  Yes  Obs letter signed and copy given.  Jeramine Delis 06/17/2024, 11:37 AM

## 2024-06-17 NOTE — TOC Initial Note (Signed)
 Transition of Care (TOC) - Initial/Assessment Note   Spoke to patient regarding OP PT. Usually twice a week for about a hour. Patient states he has transportation.   Offered choice, no preference. Referral placed . Secure chatted MD to sign referral. Information on AVS  Patient Details  Name: Edward Mullen MRN: [id number] Date of Birth: April 28, 1956  Transition of Care [facility]) CM/SW Contact:    Stephane Powell Jansky, RN Phone Number: 06/17/2024, 2:47 PM  Clinical Narrative:                   Expected Discharge Plan: Home/Self Care Barriers to Discharge: Continued Medical Work up   Patient Goals and CMS Choice Patient states their goals for this hospitalization and ongoing recovery are:: to return to home CMS Medicare.gov Compare Post Acute Care list provided to:: Patient Choice offered to / list presented to : Patient      Expected Discharge Plan and Services   Discharge Planning Services: CM Consult Post Acute Care Choice:  (OP PT) Living arrangements for the past 2 months: Single Family Home                 DME Arranged: N/A DME Agency: NA       HH Arranged: NA HH Agency: NA        Prior Living Arrangements/Services Living arrangements for the past 2 months: Single Family Home Lives with:: Spouse Patient language and need for interpreter reviewed:: Yes Do you feel safe going back to the place where you live?: Yes      Need for Family Participation in Patient Care: Yes (Comment) Care giver support system in place?: Yes (comment) Current home services: DME Criminal Activity/Legal Involvement Pertinent to Current Situation/Hospitalization: No - Comment as needed  Activities of Daily Living   ADL Screening (condition at time of admission) Independently performs ADLs?: Yes (appropriate for developmental age) Is the patient deaf or have difficulty hearing?: No Does the patient have difficulty seeing, even when wearing glasses/contacts?: No Does the patient have  difficulty concentrating, remembering, or making decisions?: No  Permission Sought/Granted   Permission granted to share information with : Yes, Verbal Permission Granted     Permission granted to share info w AGENCY: OP PT        Emotional Assessment Appearance:: Appears stated age Attitude/Demeanor/Rapport: Engaged Affect (typically observed): Appropriate Orientation: : Oriented to Self, Oriented to Place, Oriented to  Time, Oriented to Situation Alcohol / Substance Use: Not Applicable Psych Involvement: No (comment)  Admission diagnosis:  AKI (acute kidney injury) [N17.9] Ground-level fall [W18.30XA] Patient Active Problem List   Diagnosis Date Noted   Multiple rib fractures 06/16/2024   Thrombocytopenia 06/16/2024   BPH (benign prostatic hyperplasia) 06/16/2024   Syncope 12/28/2023   Malfunction of implantable defibrillator ventricular (ICD) lead 12/22/2023   CKD stage 3a, GFR 45-59 ml/min (HCC) 10/02/2023   Uncontrolled type 2 diabetes mellitus with hypoglycemia, without long-term current use of insulin  (HCC) 10/02/2023   Chronic hip pain, right 10/01/2023   Acute hypoxemic respiratory failure due to Multifocal Pneumonia 04/15/2023   Epididymoorchitis 03/12/2023   HFrEF (heart failure with reduced ejection fraction) (HCC) 03/11/2023   Volume depletion 03/11/2023   Hypotension 03/10/2023   Shingles rash 12/05/2022   Herpes zoster dermatitis 12/05/2022   Hyperkalemia 12/05/2022   Chest pain, musculoskeletal 12/05/2022   Increased anion gap metabolic acidosis 12/05/2022   Abnormal echocardiogram 11/24/2022   Malnutrition of moderate degree 11/19/2022   Cardiogenic shock (HCC) 11/18/2022  Cardiac arrest (HCC) 11/17/2022   LBBB (left bundle branch block) 10/21/2022   PVC (premature ventricular contraction) frequent 10/21/2022   COPD (chronic obstructive pulmonary disease) (HCC) 06/07/2022   Hyperlipemia 04/04/2022   First time seizure (HCC) 12/28/2021   Type 2  diabetes mellitus with stage 3b chronic kidney disease, without long-term current use of insulin  (HCC) 10/05/2021   Hemoptysis    Acute exacerbation of CHF (congestive heart failure) (HCC) 10/04/2021   Urinary retention    Scrotal mass 08/01/2020   Nocturia 08/01/2020   Lumbar spinal stenosis 06/08/2019   Healthcare maintenance 05/11/2019   Housing problems 05/11/2019   Difficulty urinating 04/07/2018   Colon cancer screening 04/07/2018   Anemia 12/19/2016   CKD (chronic kidney disease) stage 3, GFR 30-59 ml/min (HCC) 12/13/2016   Near syncope 12/12/2016   Chronic systolic heart failure (HCC) 09/06/2016   NSVT (nonsustained ventricular tachycardia) (HCC) 09/06/2016   Non-ischemic cardiomyopathy (HCC) 08/27/2016   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 [place] [address] Phone: [phone] Fax: [phone]  [facility] [address] [place] [address] Phone: [phone] Fax: [phone]     Social Drivers of Health (SDOH) Social History: SDOH Screenings   Food Insecurity: No Food Insecurity (06/17/2024)  Housing: Low Risk  (06/17/2024)  Transportation Needs: No Transportation Needs (06/17/2024)  Utilities: Not At Risk (06/17/2024)  Alcohol Screen: Low Risk  (06/17/2022)  Depression (PHQ2-9): Low Risk  (12/04/2022)  Financial Resource Strain: Low Risk  (06/17/2022)  Physical Activity: Inactive (06/17/2022)  Social Connections: Socially Isolated (06/17/2024)  Stress: No Stress Concern Present (06/17/2022)  Tobacco Use: High Risk (06/16/2024)   SDOH Interventions:     Readmission Risk  Interventions    03/12/2023   12:00 PM  Readmission Risk Prevention Plan  Transportation Screening Complete  PCP or Specialist Appt within 3-5 Days Complete  HRI or Home Care Consult Complete  Palliative Care Screening Not Applicable  Medication Review (RN Care Manager) Complete

## 2024-06-17 NOTE — Hospital Course (Addendum)
 Brief Narrative:   68 year old with history of CKD stage III AA, systolic CHF 35% June 2025, polysubstance abuse, BPH, V. tach, cardiac arrest with ICD placement on amiodarone , CAD status post PCI 2023 admitted for multiple falls at home after tripping over grandchildren twice.  CT scan showed rib fractures and acute kidney injury.  During hospitalization pain was controlled, received IV fluid for AKI which is now improved.  PT recommended outpatient follow-up.  Assessment & Plan:  Mechanical fall Multiple rib fractures, left 9th and 10th - Pain control, lidocaine  patch.  Bowel regimen.  PT/OT. = outptn - Trauma workup reviewed - CK levels normal  AKI - Baseline creatinine 1.7, admission creatinine 2.5 slowly improving with IV fluids.  Creatinine today 2.06. Follow up outptn, tolerating PO  Congestive heart failure with reduced EF, 30% History of NSVT - Echocardiogram in June 2025 showed EF 30% with grade 1 DD.  Continue home Coreg  - On amiodarone   Polysubstance abuse - Positive for cocaine  and THC  Hyperlipidemia - Statin  Essential hypertension - Coreg , losartan .  IV as needed  History of CAD status post PCI - Continue statin  COPD - Bronchodilators  BPH - Flomax    DVT prophylaxis: SCDs Start: 06/16/24 2221    Code Status: Full Code Family Communication:   Discharge today   PT Follow up Recs: Outpatient Pt (For Balance)06/17/2024 1200  Subjective: Feels well, wishing to go home.    Examination:  General exam: Appears calm and comfortable  Respiratory system: Clear to auscultation. Respiratory effort normal. Cardiovascular system: S1 & S2 heard, RRR. No JVD, murmurs, rubs, gallops or clicks. No pedal edema. Gastrointestinal system: Abdomen is nondistended, soft and nontender. No organomegaly or masses felt. Normal bowel sounds heard. Central nervous system: Alert and oriented. No focal neurological deficits. Extremities: Symmetric 5 x 5 power. Skin: No  rashes, lesions or ulcers Psychiatry: Judgement and insight appear normal. Mood & affect appropriate.

## 2024-06-17 NOTE — Evaluation (Signed)
 Physical Therapy Evaluation Patient Details Name: Edward Mullen MRN: [id number] DOB: [birth year] Today's Date: 06/17/2024  History of Present Illness  The pt is a 68yo male presenting 11/5 after a fall. Work up revealed 9th and 10th rib fx and AKI. PMH includes: CKD III, CHF, polysubstance abuse, BPH, vtach, arrest requiring ICD placement, CAD s/p stent placement.  Clinical Impression  Pt in bed upon arrival of PT, agreeable to evaluation at this time. Prior to admission the pt was ambulating with use of cane, but reports frequent instability and additional fall in last few months. The pt was able to complete sit-stand transfers without physical assistance, but did require minA to steady with gait, especially with head movements or distraction during ambulation. The pt had multiple LOB to the R with use of cane, recommended use of RW for BUE support. Will continue to follow acutely, but also recommend OPPT after d/c to further progress dynamic stability and reduce risk of continued falls, pt in agreement.      If plan is discharge home, recommend the following: A little help with walking and/or transfers;Assist for transportation;Help with stairs or ramp for entrance   Can travel by private vehicle        Equipment Recommendations None recommended by PT  Recommendations for Other Services       Functional Status Assessment Patient has had a recent decline in their functional status and demonstrates the ability to make significant improvements in function in a reasonable and predictable amount of time.     Precautions / Restrictions Precautions Precautions: Fall Recall of Precautions/Restrictions: Intact Restrictions Weight Bearing Restrictions Per Provider Order: No      Mobility  Bed Mobility Overal bed mobility: Needs Assistance             General bed mobility comments: OOB at start and end of session    Transfers Overall transfer level: Needs assistance Equipment  used: Straight cane Transfers: Sit to/from Stand Sit to Stand: Contact guard assist           General transfer comment: CGA for safety, no overt LOB    Ambulation/Gait Ambulation/Gait assistance: Contact guard assist, Min assist Gait Distance (Feet): 150 Feet Assistive device: Straight cane Gait Pattern/deviations: Step-through pattern, Decreased stride length, Staggering right Gait velocity: decreased Gait velocity interpretation: <1.31 ft/sec, indicative of household ambulator   General Gait Details: pt with x5 lateral LOB, especially with distraction and head movement when ambulating. minA to correct, pt encouraged to use RW for BUE support  Stairs Stairs: Yes Stairs assistance: Contact guard assist Stair Management: One rail Left, Step to pattern, Forwards Number of Stairs: 3      Balance Overall balance assessment: Needs assistance, History of Falls Sitting-balance support: Feet supported Sitting balance-Leahy Scale: Good     Standing balance support: Single extremity supported Standing balance-Leahy Scale: Poor Standing balance comment: multiple LOB with single UE support, recommend BUE support, poor tolerance of challenge                             Pertinent Vitals/Pain Pain Assessment Pain Assessment: Faces Faces Pain Scale: Hurts little more Pain Location: L ribs Pain Descriptors / Indicators: Grimacing Pain Intervention(s): Limited activity within patient's tolerance, Monitored during session, Repositioned    Home Living Family/patient expects to be discharged to:: Private residence Living Arrangements: Children Available Help at Discharge: Family;Available 24 hours/day Type of Home: House Home Access: Stairs to enter Entrance  Stairs-Rails: Right;Left Entrance Stairs-Number of Steps: 5   Home Layout: One level Home Equipment: Agricultural Consultant (2 wheels);Cane - single point;Educational Psychologist (4 wheels) Additional Comments: lives with  daughter    Prior Function Prior Level of Function : Independent/Modified Independent             Mobility Comments: using cane or rolling walker for ambulation.  Occasional assist for sit to stand from lower surfaces. ADLs Comments: dtr assists as needed, family cooks for him     Extremity/Trunk Assessment   Upper Extremity Assessment Upper Extremity Assessment: Defer to OT evaluation    Lower Extremity Assessment Lower Extremity Assessment: Overall WFL for tasks assessed    Cervical / Trunk Assessment Cervical / Trunk Assessment: Normal  Communication   Communication Communication: No apparent difficulties    Cognition Arousal: Alert Behavior During Therapy: WFL for tasks assessed/performed   PT - Cognitive impairments: Problem solving, Safety/Judgement                       PT - Cognition Comments: pt with slightly decreased insight to safety, but able to follow all commands in session Following commands: Intact       Cueing Cueing Techniques: Verbal cues     General Comments General comments (skin integrity, edema, etc.): VSS on RA    Exercises     Assessment/Plan    PT Assessment Patient needs continued PT services  PT Problem List Decreased activity tolerance;Decreased balance;Decreased mobility;Decreased safety awareness       PT Treatment Interventions DME instruction;Gait training;Stair training;Functional mobility training;Therapeutic activities;Therapeutic exercise;Balance training;Patient/family education    PT Goals (Current goals can be found in the Care Plan section)  Acute Rehab PT Goals Patient Stated Goal: to reduce falls PT Goal Formulation: With patient Time For Goal Achievement: 07/01/24 Potential to Achieve Goals: Good    Frequency Min 2X/week        AM-PAC PT 6 Clicks Mobility  Outcome Measure Help needed turning from your back to your side while in a flat bed without using bedrails?: None Help needed moving  from lying on your back to sitting on the side of a flat bed without using bedrails?: None Help needed moving to and from a bed to a chair (including a wheelchair)?: A Little Help needed standing up from a chair using your arms (e.g., wheelchair or bedside chair)?: A Little Help needed to walk in hospital room?: A Little Help needed climbing 3-5 steps with a railing? : A Little 6 Click Score: 20    End of Session Equipment Utilized During Treatment: Gait belt Activity Tolerance: Patient tolerated treatment well Patient left: in chair;with call bell/phone within reach;with chair alarm set Nurse Communication: Mobility status PT Visit Diagnosis: Unsteadiness on feet (R26.81);Repeated falls (R29.6)    Time: 8896-8883 PT Time Calculation (min) (ACUTE ONLY): 13 min   Charges:   PT Evaluation $PT Eval Low Complexity: 1 Low   PT General Charges $$ ACUTE PT VISIT: 1 Visit         Izetta Call, PT, DPT   Acute Rehabilitation Department Office [phone] Secure Chat Communication Preferred  Izetta JULIANNA Call 06/17/2024, 12:41 PM

## 2024-06-17 NOTE — Progress Notes (Signed)
 PROGRESS NOTE    Edward Mullen  FMW:[id number] DOB: February 28, 1956 DOA: 06/16/2024 PCP: Leontine Cramp, NP    Brief Narrative:   68 year old with history of CKD stage III AA, systolic CHF 35% June 2025, polysubstance abuse, BPH, V. tach, cardiac arrest with ICD placement on amiodarone , CAD status post PCI 2023 admitted for multiple falls at home after tripping over grandchildren twice.  CT scan showed rib fractures and acute kidney injury  Assessment & Plan:  Mechanical fall Multiple rib fractures, left 9th and 10th - Pain control, lidocaine  patch.  Bowel regimen.  PT/OT. - Trauma workup reviewed - CK levels normal  AKI - Baseline creatinine 1.7, admission creatinine 2.5 slowly improving with IV fluids  Congestive heart failure with reduced EF, 30% History of NSVT - Echocardiogram in June 2025 showed EF 30% with grade 1 DD.  Continue home Coreg  - On amiodarone   Polysubstance abuse - Positive for cocaine  and THC  Hyperlipidemia - Statin  Essential hypertension - Coreg , losartan .  IV as needed  History of CAD status post PCI - Continue statin  COPD - Bronchodilators  BPH - Flomax    DVT prophylaxis: SCDs Start: 06/16/24 2221    Code Status: Full Code Family Communication:   Continue hospital stay   PT Follow up Recs:   Subjective:  Seen at bedside, sitting up in the recliner.  Reporting of left-sided rib pain  Examination:  General exam: Appears calm and comfortable  Respiratory system: Clear to auscultation. Respiratory effort normal. Cardiovascular system: S1 & S2 heard, RRR. No JVD, murmurs, rubs, gallops or clicks. No pedal edema. Gastrointestinal system: Abdomen is nondistended, soft and nontender. No organomegaly or masses felt. Normal bowel sounds heard. Central nervous system: Alert and oriented. No focal neurological deficits. Extremities: Symmetric 5 x 5 power. Skin: No rashes, lesions or ulcers Psychiatry: Judgement and insight appear normal.  Mood & affect appropriate.                Diet Orders (From admission, onward)     Start     Ordered   06/16/24 2221  Diet Heart Room service appropriate? Yes; Fluid consistency: Thin  Diet effective now       Question Answer Comment  Room service appropriate? Yes   Fluid consistency: Thin      06/16/24 2221            Objective: Vitals:   06/17/24 0925 06/17/24 0930 06/17/24 1134 06/17/24 1138  BP: 103/72 100/75 108/72 (!) 101/57  Pulse: 69 71  65  Resp:      Temp: (!) 97.5 F (36.4 C)  (!) 97.4 F (36.3 C)   TempSrc:   Oral   SpO2: 100% 98%    Weight:      Height:        Intake/Output Summary (Last 24 hours) at 06/17/2024 1220 Last data filed at 06/17/2024 0800 Gross per 24 hour  Intake 310.1 ml  Output 580 ml  Net -269.9 ml   Filed Weights   06/16/24 1418 06/16/24 2300  Weight: 102.1 kg 107.1 kg    Scheduled Meds:  amiodarone   200 mg Oral Daily   arformoterol   15 mcg Nebulization BID   And   umeclidinium bromide   1 puff Inhalation Daily   carvedilol   3.125 mg Oral BID   gabapentin   600 mg Oral BID   insulin  aspart  0-9 Units Subcutaneous Q4H   lactulose  30 g Oral Q4H   lidocaine   1 patch Transdermal Q24H  polyethylene glycol  17 g Oral BID   rosuvastatin   40 mg Oral Daily   tamsulosin   0.4 mg Oral QHS   Continuous Infusions:  sodium chloride  50 mL/hr at 06/17/24 0810    Nutritional status     Body mass index is 32.02 kg/m.  Data Reviewed:   CBC: Recent Labs  Lab 06/16/24 1530 06/16/24 1546 06/17/24 0348  WBC 5.1  --  3.9*  NEUTROABS 3.0  --   --   HGB 13.3 14.3 12.8*  HCT 42.1 42.0 40.1  MCV 88.8  --  87.9  PLT 89*  --  81*   Basic Metabolic Panel: Recent Labs  Lab 06/16/24 1530 06/16/24 1546 06/16/24 2220 06/17/24 0348  NA 135 138 137 136  K 4.9 5.0 5.0 4.2  CL 104 105 104 104  CO2 21*  --  23 24  GLUCOSE 81 79 79 85  BUN 32* 34* 29* 27*  CREATININE 2.35* 2.50* 2.16* 2.07*  CALCIUM  8.7*  --  8.9 8.7*   MG  --   --  2.2 2.2  PHOS  --   --  2.6 2.8   GFR: Estimated Creatinine Clearance: 42 mL/min (A) (by C-G formula based on SCr of 2.07 mg/dL (H)). Liver Function Tests: Recent Labs  Lab 06/16/24 1530 06/17/24 0348  AST 30 28  ALT 27 26  ALKPHOS 151* 117  BILITOT 0.5 0.6  PROT 7.7 7.5  ALBUMIN  3.2* 3.1*   No results for input(s): LIPASE, AMYLASE in the last 168 hours. No results for input(s): AMMONIA in the last 168 hours. Coagulation Profile: Recent Labs  Lab 06/16/24 2108  INR 1.0   Cardiac Enzymes: Recent Labs  Lab 06/16/24 2220  CKTOTAL 232   BNP (last 3 results) No results for input(s): PROBNP in the last 8760 hours. HbA1C: Recent Labs    06/16/24 1530  HGBA1C 5.6   CBG: Recent Labs  Lab 06/16/24 2226 06/16/24 2358 06/17/24 0422 06/17/24 1014 06/17/24 1216  GLUCAP 76 95 79 137* 124*   Lipid Profile: No results for input(s): CHOL, HDL, LDLCALC, TRIG, CHOLHDL, LDLDIRECT in the last 72 hours. Thyroid  Function Tests: No results for input(s): TSH, T4TOTAL, FREET4, T3FREE, THYROIDAB in the last 72 hours. Anemia Panel: No results for input(s): VITAMINB12, FOLATE, FERRITIN, TIBC, IRON, RETICCTPCT in the last 72 hours. Sepsis Labs: No results for input(s): PROCALCITON, LATICACIDVEN in the last 168 hours.  No results found for this or any previous visit (from the past 240 hours).       Radiology Studies: CT L-SPINE NO CHARGE Result Date: 06/16/2024 EXAM: CT OF THE LUMBAR SPINE WITHOUT CONTRAST 06/16/2024 05:42:00 PM TECHNIQUE: CT of the lumbar spine was performed without the administration of intravenous contrast. Multiplanar reformatted images are provided for review. Automated exposure control, iterative reconstruction, and/or weight based adjustment of the mA/kV was utilized to reduce the radiation dose to as low as reasonably achievable. COMPARISON: 06/10/2019 CLINICAL HISTORY: FINDINGS: BONES AND  ALIGNMENT: Normal vertebral body heights. Posterior fusion changes from L3 to S1. Posterior left 10th rib fracture noted. Normal alignment. DEGENERATIVE CHANGES: Spurring in the lower thoracic and upper lumbar spine. SOFT TISSUES: No acute abnormality. IMPRESSION: 1. Posterior left 10th rib fracture. 2. Postoperative and degenerative changes in the lumbar spine. Electronically signed by: Franky Crease MD 06/16/2024 06:14 PM EST RP Workstation: [id number]   CT T-SPINE NO CHARGE Result Date: 06/16/2024 EXAM: CT THORACIC SPINE WITHOUT CONTRAST 06/16/2024 05:42:00 PM TECHNIQUE: CT of the thoracic spine was performed  without the administration of intravenous contrast. Multiplanar reformatted images are provided for review. Automated exposure control, iterative reconstruction, and/or weight based adjustment of the mA/kV was utilized to reduce the radiation dose to as low as reasonably achievable. COMPARISON: Comparison to today's CT chest, abdomen, and pelvis. CLINICAL HISTORY: FINDINGS: BONES AND ALIGNMENT: Normal vertebral body heights. No acute fracture or suspicious bone lesion. Normal alignment. DEGENERATIVE CHANGES: Flowing anterior and right lateral osteophytes. SOFT TISSUES: No acute abnormality. IMPRESSION: 1. No acute abnormality of the thoracic spine. Electronically signed by: Franky Crease MD 06/16/2024 06:12 PM EST RP Workstation: [id number]   CT CHEST ABDOMEN PELVIS WO CONTRAST Result Date: 06/16/2024 EXAM: CT CHEST, ABDOMEN AND PELVIS WITHOUT CONTRAST 06/16/2024 05:42:00 PM TECHNIQUE: CT of the chest, abdomen and pelvis was performed without the administration of intravenous contrast. Multiplanar reformatted images are provided for review. Automated exposure control, iterative reconstruction, and/or weight based adjustment of the mA/kV was utilized to reduce the radiation dose to as low as reasonably achievable. COMPARISON: 07/07/2023 CLINICAL HISTORY: Polytrauma, blunt; fall on to left side, diffuse  abdominal pain. FINDINGS: CHEST: MEDIASTINUM AND LYMPH NODES: Pacer in place with leads in the right heart. Coronary artery and aortic atherosclerosis. The central airways are clear. No mediastinal, hilar or axillary lymphadenopathy. LUNGS AND PLEURA: Trace left pleural effusion and left base atelectasis. No focal consolidation or pulmonary edema. No pneumothorax. ABDOMEN AND PELVIS: LIVER: The liver is unremarkable. GALLBLADDER AND BILE DUCTS: Gallbladder is unremarkable. No biliary ductal dilatation. SPLEEN: No acute abnormality. PANCREAS: No acute abnormality. ADRENAL GLANDS: No acute abnormality. KIDNEYS, URETERS AND BLADDER: No stones in the kidneys or ureters. No hydronephrosis. No perinephric or periureteral stranding. Urinary bladder is unremarkable. GI AND BOWEL: Stomach demonstrates no acute abnormality. Diverticulosis. There is no bowel obstruction. APPENDIX: Normal appendix. REPRODUCTIVE ORGANS: Prostate enlargement. PERITONEUM AND RETROPERITONEUM: No ascites. No free air. VASCULATURE: Aorta is normal in caliber. Aortic atherosclerosis. ABDOMINAL AND PELVIS LYMPH NODES: No lymphadenopathy. BONES AND SOFT TISSUES: Posterior left 9th and 10th rib fractures. Postoperative and degenerative changes in the lumbar spine. No focal soft tissue abnormality. IMPRESSION: 1. Posterior left 9th and 10th rib fractures. 2. Trace left pleural effusion with left basilar atelectasis. 3. No acute findings in the abdomen or pelvis. 4. Scattered colonic diverticulosis. 5. Prostate enlargement. Electronically signed by: Franky Crease MD 06/16/2024 06:10 PM EST RP Workstation: [id number]   CT HEAD WO CONTRAST Result Date: 06/16/2024 CLINICAL DATA:  Moderate head/neck trauma from fall. EXAM: CT HEAD WITHOUT CONTRAST CT CERVICAL SPINE WITHOUT CONTRAST TECHNIQUE: Multidetector CT imaging of the head and cervical spine was performed following the standard protocol without intravenous contrast. Multiplanar CT image reconstructions  of the cervical spine were also generated. RADIATION DOSE REDUCTION: This exam was performed according to the departmental dose-optimization program which includes automated exposure control, adjustment of the mA and/or kV according to patient size and/or use of iterative reconstruction technique. COMPARISON:  Head CT 12/28/2023 FINDINGS: CT HEAD FINDINGS Brain: Ventricles, cisterns and other CSF spaces are normal. There is no mass, mass effect, shift of midline structures or acute hemorrhage. Mild chronic ischemic microvascular disease is present. Vascular: No hyperdense vessel or unexpected calcification. Skull: Normal. Negative for fracture or focal lesion. Sinuses/Orbits: No acute finding. Other: None. CT CERVICAL SPINE FINDINGS Alignment: Normal. Skull base and vertebrae: Moderate spondylosis throughout the cervical spine to include uncovertebral joint spurring and facet arthropathy. Vertebral body heights are maintained. Atlantoaxial articulation is unremarkable. No acute fracture. Bilateral neural foraminal narrowing at the C4-5 level  right worse than left. Bilateral neural foraminal narrowing at the C5-6 and C6-7 levels. Soft tissues and spinal canal: Minimal canal stenosis at the C6-7 level and to lesser extent at the C5-6 and C2-3 levels. Prevertebral soft tissues are normal. Disc levels: Moderate disc space narrowing at the C5-6 and C6-7 levels and to lesser extent at the C4-5 level. Upper chest: No acute findings. Other: None. IMPRESSION: 1. No acute brain injury. 2. Mild chronic ischemic microvascular disease. 3. No acute cervical spine injury. 4. Moderate spondylosis throughout the cervical spine with multilevel disc disease, multilevel neural foraminal narrowing and mild canal stenosis as described. Electronically Signed   By: Toribio Agreste M.D.   On: 06/16/2024 16:58   CT CERVICAL SPINE WO CONTRAST Result Date: 06/16/2024 CLINICAL DATA:  Moderate head/neck trauma from fall. EXAM: CT HEAD WITHOUT  CONTRAST CT CERVICAL SPINE WITHOUT CONTRAST TECHNIQUE: Multidetector CT imaging of the head and cervical spine was performed following the standard protocol without intravenous contrast. Multiplanar CT image reconstructions of the cervical spine were also generated. RADIATION DOSE REDUCTION: This exam was performed according to the departmental dose-optimization program which includes automated exposure control, adjustment of the mA and/or kV according to patient size and/or use of iterative reconstruction technique. COMPARISON:  Head CT 12/28/2023 FINDINGS: CT HEAD FINDINGS Brain: Ventricles, cisterns and other CSF spaces are normal. There is no mass, mass effect, shift of midline structures or acute hemorrhage. Mild chronic ischemic microvascular disease is present. Vascular: No hyperdense vessel or unexpected calcification. Skull: Normal. Negative for fracture or focal lesion. Sinuses/Orbits: No acute finding. Other: None. CT CERVICAL SPINE FINDINGS Alignment: Normal. Skull base and vertebrae: Moderate spondylosis throughout the cervical spine to include uncovertebral joint spurring and facet arthropathy. Vertebral body heights are maintained. Atlantoaxial articulation is unremarkable. No acute fracture. Bilateral neural foraminal narrowing at the C4-5 level right worse than left. Bilateral neural foraminal narrowing at the C5-6 and C6-7 levels. Soft tissues and spinal canal: Minimal canal stenosis at the C6-7 level and to lesser extent at the C5-6 and C2-3 levels. Prevertebral soft tissues are normal. Disc levels: Moderate disc space narrowing at the C5-6 and C6-7 levels and to lesser extent at the C4-5 level. Upper chest: No acute findings. Other: None. IMPRESSION: 1. No acute brain injury. 2. Mild chronic ischemic microvascular disease. 3. No acute cervical spine injury. 4. Moderate spondylosis throughout the cervical spine with multilevel disc disease, multilevel neural foraminal narrowing and mild canal  stenosis as described. Electronically Signed   By: Toribio Agreste M.D.   On: 06/16/2024 16:58   DG Shoulder Left Result Date: 06/16/2024 CLINICAL DATA:  Left shoulder pain post fall. EXAM: LEFT SHOULDER - 2+ VIEW COMPARISON:  None Available. FINDINGS: Mild degenerate change over the [facility] joint and glenohumeral joints. No evidence of acute fracture or dislocation. IMPRESSION: 1. No acute findings. 2. Mild degenerative changes. Electronically Signed   By: Toribio Agreste M.D.   On: 06/16/2024 16:49           LOS: 0 days   Time spent= 35 mins    Burgess JAYSON Dare, MD [facility]  If 7PM-7AM, please contact night-coverage  06/17/2024, 12:20 PM

## 2024-06-17 NOTE — Progress Notes (Signed)
 Initial Nutrition Assessment  DOCUMENTATION CODES:   Obesity unspecified  INTERVENTION:  Ensure Plus High Protein PO BID. Each supplement provides 350 Kcals and 20 grams of protein. Continue heart-healthy diet.   NUTRITION DIAGNOSIS:    (Obesity) related to  (excessive energy intake) as evidenced by  (BMI >30).   GOAL:   Patient will meet greater than or equal to 90% of their needs   MONITOR:   PO intake, Supplement acceptance, Weight trends, Labs  REASON FOR ASSESSMENT:   Consult Assessment of nutrition requirement/status  ASSESSMENT:   Patient presented with rib fracture and AKI s/p multiple falls, positive for cocaine  and THC. PMH significant for mod PCM 11/2022, DM2, CKD3, CHF, polysubstance abuse, cardiac arrest with ICD placement, CAD s/p PCI 2023, HTN.  Visited the patient who states that he has a great appetite and is eating well. He denies any intake issues prior to admission. His UBW is 220 lbs and he commented that he has put on a lot of weight and would like to lose some. He would like to have Ensure as he drinks that at home as well. He stays active by taking care of his grandchildren.   Typical day's intake: Breakfast - eggs, sausage, grits. Lunch - typically skips because he is not hungry after the big breakfast. Dinner - meatloaf or a meat with potatoes and greens.   Scheduled Meds:  amiodarone   200 mg Oral Daily   arformoterol   15 mcg Nebulization BID   And   umeclidinium bromide   1 puff Inhalation Daily   carvedilol   3.125 mg Oral BID   gabapentin   600 mg Oral BID   insulin  aspart  0-9 Units Subcutaneous Q4H   lactulose  30 g Oral Q4H   lidocaine   1 patch Transdermal Q24H   polyethylene glycol  17 g Oral BID   rosuvastatin   40 mg Oral Daily   tamsulosin   0.4 mg Oral QHS   Continuous Infusions:  sodium chloride  50 mL/hr at 06/17/24 0810    Diet Order             Diet Heart Room service appropriate? Yes; Fluid consistency: Thin  Diet  effective now                  Meal Intake: 100% per patient.  Labs:     Latest Ref Rng & Units 06/17/2024    3:48 AM 06/16/2024   10:20 PM 06/16/2024    3:46 PM  CMP  Glucose 70 - 99 mg/dL 85  79  79   BUN 8 - 23 mg/dL 27  29  34   Creatinine 0.61 - 1.24 mg/dL 7.92  7.83  7.49   Sodium 135 - 145 mmol/L 136  137  138   Potassium 3.5 - 5.1 mmol/L 4.2  5.0  5.0   Chloride 98 - 111 mmol/L 104  104  105   CO2 22 - 32 mmol/L 24  23    Calcium  8.9 - 10.3 mg/dL 8.7  8.9    Total Protein 6.5 - 8.1 g/dL 7.5     Total Bilirubin 0.0 - 1.2 mg/dL 0.6     Alkaline Phos 38 - 126 U/L 117     AST 15 - 41 U/L 28     ALT 0 - 44 U/L 26       I/O: -300 mL since admit  NUTRITION - FOCUSED PHYSICAL EXAM:  Flowsheet Row Most Recent Value  Orbital Region No depletion  Upper Arm Region No depletion  Thoracic and Lumbar Region No depletion  Buccal Region Moderate depletion  Temple Region Moderate depletion  Clavicle Bone Region Mild depletion  Clavicle and Acromion Bone Region Mild depletion  Scapular Bone Region No depletion  Dorsal Hand No depletion  Patellar Region Mild depletion  Anterior Thigh Region No depletion  Posterior Calf Region No depletion  Edema (RD Assessment) Mild  [non-pitting BLE]  Hair Reviewed  Eyes Reviewed  Mouth Other (Comment)  [edentulous and requires full dentures]  Skin Reviewed  Nails Reviewed    EDUCATION NEEDS:   Education needs have been addressed  Skin:  Skin Assessment: Reviewed RN Assessment  Last BM:  none this admission, constipation noted  Height:   Ht Readings from Last 1 Encounters:  06/16/24 6' (1.829 m)    Weight:    Ideal Body Weight:  81 kg  BMI:  Body mass index is 32.02 kg/m.  Estimated Nutritional Needs:  Kcal:  2000-2400 Protein:  110-120 Fluid:  >2000    Leverne Ruth, MS, RDN, LDN [place]. [facility] See AMION for contact information

## 2024-06-18 ENCOUNTER — Other Ambulatory Visit (HOSPITAL_COMMUNITY): Payer: Self-pay

## 2024-06-18 DIAGNOSIS — S2242XA Multiple fractures of ribs, left side, initial encounter for closed fracture: Secondary | ICD-10-CM | POA: Diagnosis not present

## 2024-06-18 DIAGNOSIS — N179 Acute kidney failure, unspecified: Secondary | ICD-10-CM | POA: Diagnosis not present

## 2024-06-18 LAB — BASIC METABOLIC PANEL WITH GFR
Anion gap: 11 (ref 5–15)
BUN: 20 mg/dL (ref 8–23)
CO2: 22 mmol/L (ref 22–32)
Calcium: 8.8 mg/dL — ABNORMAL LOW (ref 8.9–10.3)
Chloride: 101 mmol/L (ref 98–111)
Creatinine, Ser: 2.06 mg/dL — ABNORMAL HIGH (ref 0.61–1.24)
GFR, Estimated: 34 mL/min — ABNORMAL LOW (ref >60)
Glucose, Bld: 88 mg/dL (ref 70–99)
Potassium: 4.8 mmol/L (ref 3.5–5.1)
Sodium: 134 mmol/L — ABNORMAL LOW (ref 135–145)

## 2024-06-18 LAB — MAGNESIUM: Magnesium: 2 mg/dL (ref 1.7–2.4)

## 2024-06-18 LAB — CBC
HCT: 43.2 % (ref 39.0–52.0)
Hemoglobin: 13.1 g/dL (ref 13.0–17.0)
MCH: 27.5 pg (ref 26.0–34.0)
MCHC: 30.3 g/dL (ref 30.0–36.0)
MCV: 90.8 fL (ref 80.0–100.0)
Platelets: 79 K/uL — ABNORMAL LOW (ref 150–400)
RBC: 4.76 MIL/uL (ref 4.22–5.81)
RDW: 17 % — ABNORMAL HIGH (ref 11.5–15.5)
WBC: 4.4 K/uL (ref 4.0–10.5)
nRBC: 0 % (ref 0.0–0.2)

## 2024-06-18 LAB — GLUCOSE, CAPILLARY
Glucose-Capillary: 114 mg/dL — ABNORMAL HIGH (ref 70–99)
Glucose-Capillary: 128 mg/dL — ABNORMAL HIGH (ref 70–99)
Glucose-Capillary: 92 mg/dL (ref 70–99)

## 2024-06-18 MED ORDER — HYDROCODONE-ACETAMINOPHEN 5-325 MG PO TABS
1.0000 | ORAL_TABLET | Freq: Four times a day (QID) | ORAL | 0 refills | Status: DC | PRN
  Filled 2024-06-18: qty 30, 4d supply, fill #0

## 2024-06-18 MED ORDER — LIDOCAINE 5 % EX PTCH
2.0000 | MEDICATED_PATCH | CUTANEOUS | 0 refills | Status: DC
  Filled 2024-06-18: qty 60, 30d supply, fill #0

## 2024-06-18 MED ORDER — SENNA 8.6 MG PO TABS
2.0000 | ORAL_TABLET | Freq: Every day | ORAL | 0 refills | Status: DC
  Filled 2024-06-18: qty 60, 30d supply, fill #0

## 2024-06-18 MED ORDER — POLYETHYLENE GLYCOL 3350 17 GM/SCOOP PO POWD
17.0000 g | Freq: Two times a day (BID) | ORAL | 0 refills | Status: AC | PRN
  Filled 2024-06-18: qty 238, 7d supply, fill #0

## 2024-06-18 NOTE — Plan of Care (Signed)
  Problem: Coping: Goal: Ability to adjust to condition or change in health will improve Outcome: Progressing   Problem: Health Behavior/Discharge Planning: Goal: Ability to identify and utilize available resources and services will improve Outcome: Progressing   Problem: Nutritional: Goal: Maintenance of adequate nutrition will improve Outcome: Progressing

## 2024-06-18 NOTE — Discharge Summary (Signed)
 Physician Discharge Summary  Edward Mullen:[id number] DOB: Nov 09, 1955 DOA: 06/16/2024  PCP: Leontine Cramp, NP  Admit date: 06/16/2024 Discharge date: 06/18/2024  Admitted From: Home Disposition:  Home  Recommendations for Outpatient Follow-up:  Follow up with PCP in 1-2 weeks Please obtain BMP/CBC in one week your next doctors visit.  Pain meds with bowel regimen prescribed Lidocaine  path Advised to quit Illicit drugs.    Discharge Condition: Stable CODE STATUS: Full Diet recommendation: Cardiac  Brief/Interim Summary: Brief Narrative:   68 year old with history of CKD stage III AA, systolic CHF 35% June 2025, polysubstance abuse, BPH, V. tach, cardiac arrest with ICD placement on amiodarone , CAD status post PCI 2023 admitted for multiple falls at home after tripping over grandchildren twice.  CT scan showed rib fractures and acute kidney injury.  During hospitalization pain was controlled, received IV fluid for AKI which is now improved.  PT recommended outpatient follow-up.  Assessment & Plan:  Mechanical fall Multiple rib fractures, left 9th and 10th - Pain control, lidocaine  patch.  Bowel regimen.  PT/OT. = outptn - Trauma workup reviewed - CK levels normal  AKI - Baseline creatinine 1.7, admission creatinine 2.5 slowly improving with IV fluids.  Creatinine today 2.06. Follow up outptn, tolerating PO  Congestive heart failure with reduced EF, 30% History of NSVT - Echocardiogram in June 2025 showed EF 30% with grade 1 DD.  Continue home Coreg  - On amiodarone   Polysubstance abuse - Positive for cocaine  and THC  Hyperlipidemia - Statin  Essential hypertension - Coreg , losartan .  IV as needed  History of CAD status post PCI - Continue statin  COPD - Bronchodilators  BPH - Flomax    DVT prophylaxis: SCDs Start: 06/16/24 2221    Code Status: Full Code Family Communication:   Discharge today   PT Follow up Recs: Outpatient Pt (For Balance)06/17/2024  1200  Subjective: Feels well, wishing to go home.    Examination:  General exam: Appears calm and comfortable  Respiratory system: Clear to auscultation. Respiratory effort normal. Cardiovascular system: S1 & S2 heard, RRR. No JVD, murmurs, rubs, gallops or clicks. No pedal edema. Gastrointestinal system: Abdomen is nondistended, soft and nontender. No organomegaly or masses felt. Normal bowel sounds heard. Central nervous system: Alert and oriented. No focal neurological deficits. Extremities: Symmetric 5 x 5 power. Skin: No rashes, lesions or ulcers Psychiatry: Judgement and insight appear normal. Mood & affect appropriate.    Discharge Diagnoses:  Principal Problem:   AKI (acute kidney injury) Active Problems:   HTN (hypertension)   Chronic systolic heart failure (HCC)   NSVT (nonsustained ventricular tachycardia) (HCC)   CKD (chronic kidney disease) stage 3, GFR 30-59 ml/min (HCC)   Lumbar spinal stenosis   Type 2 diabetes mellitus with stage 3b chronic kidney disease, without long-term current use of insulin  (HCC)   Hyperlipemia   COPD (chronic obstructive pulmonary disease) (HCC)   Multiple rib fractures   Thrombocytopenia   BPH (benign prostatic hyperplasia)      Discharge Exam: Vitals:   06/18/24 0747 06/18/24 0824  BP: (!) 138/92   Pulse: 78   Resp: 18   Temp: 98.3 F (36.8 C)   SpO2: 97% 98%   Vitals:   06/17/24 2135 06/18/24 0526 06/18/24 0747 06/18/24 0824  BP:  113/82 (!) 138/92   Pulse:  64 78   Resp:  18 18   Temp:  97.6 F (36.4 C) 98.3 F (36.8 C)   TempSrc:      SpO2: 100% 97%  97% 98%  Weight:      Height:          Discharge Instructions  Discharge Instructions     Ambulatory referral to Physical Therapy   Complete by: As directed    OP PT for balance   Iontophoresis - 4 mg/ml of dexamethasone : No   T.E.N.S. Unit Evaluation and Dispense as Indicated: No      Allergies as of 06/18/2024   No Known Allergies       Medication List     STOP taking these medications    methylPREDNISolone 4 MG Tbpk tablet Commonly known as: MEDROL DOSEPAK       TAKE these medications    acetaminophen  500 MG tablet Commonly known as: TYLENOL  Take 1,000 mg by mouth every 6 (six) hours as needed for mild pain (pain score 1-3) or moderate pain (pain score 4-6).   albuterol  108 (90 Base) MCG/ACT inhaler Commonly known as: VENTOLIN  HFA INHALE 1 TO 2 PUFFS BY MOUTH EVERY 6 HOURS AS NEEDED FOR WHEEZING OR SHORTNESS OF BREATH   amiodarone  200 MG tablet Commonly known as: PACERONE  Take 1 tablet (200 mg total) by mouth daily.   Aspirin  Low Dose 81 MG tablet Generic drug: aspirin  EC TAKE 1 TABLET (81 MG TOTAL) BY MOUTH DAILY. SWALLOW WHOLE(AM)   carvedilol  3.125 MG tablet Commonly known as: Coreg  Take 1 tablet (3.125 mg total) by mouth 2 (two) times daily.   gabapentin  600 MG tablet Commonly known as: NEURONTIN  Take 600 mg by mouth 2 (two) times daily.   HYDROcodone -acetaminophen  5-325 MG tablet Commonly known as: NORCO/VICODIN Take 1-2 tablets by mouth every 6 (six) hours as needed for severe pain (pain score 7-10).   Jardiance  10 MG Tabs tablet Generic drug: empagliflozin  Take 10 mg by mouth daily.   lidocaine  5 % Commonly known as: LIDODERM  Place 2 patches onto the skin daily. Remove & Discard patch within 12 hours or as directed by MD.  Apply to the left rib area   losartan  25 MG tablet Commonly known as: COZAAR  Take 25 mg by mouth daily.   mexiletine 150 MG capsule Commonly known as: MEXITIL  TAKE 1 CAPSULE BY MOUTH 2 (TWO) TIMES DAILY (AM+BEDTIME)   nortriptyline  75 MG capsule Commonly known as: PAMELOR  Take 75 mg by mouth daily.   polyethylene glycol powder 17 GM/SCOOP powder Commonly known as: GLYCOLAX /MIRALAX  Take 17 g by mouth 2 (two) times daily as needed for moderate constipation or severe constipation. Dissolve 1 capful (17g) in 4-8 ounces of liquid and take by mouth daily.    rosuvastatin  40 MG tablet Commonly known as: CRESTOR  Take 1 tablet (40 mg total) by mouth daily.   senna 8.6 MG Tabs tablet Commonly known as: SENOKOT Take 2 tablets (17.2 mg total) by mouth at bedtime.   spironolactone  25 MG tablet Commonly known as: ALDACTONE  Take 1 tablet (25 mg total) by mouth daily.   Stiolto Respimat 2.5-2.5 MCG/ACT Aers Generic drug: Tiotropium Bromide-Olodaterol Inhale 2 puffs into the lungs daily.   tamsulosin  0.4 MG Caps capsule Commonly known as: FLOMAX  Take 0.4 mg by mouth at bedtime.   torsemide  20 MG tablet Commonly known as: DEMADEX  TAKE 1 TABLET (20 MG TOTAL) BY MOUTH DAILY (AM)        Follow-up Information     [facility] Follow up.   Specialty: Rehabilitation Contact information: [address] [place]  [address] [phone]        Leontine Cramp, NP Follow  up in 1 week(s).   Specialty: Nurse Practitioner Contact information: [address] [place] [address] [phone]                No Known Allergies  You were cared for by a hospitalist during your hospital stay. If you have any questions about your discharge medications or the care you received while you were in the hospital after you are discharged, you can call the unit and asked to speak with the hospitalist on call if the hospitalist that took care of you is not available. Once you are discharged, your primary care physician will handle any further medical issues. Please note that no refills for any discharge medications will be authorized once you are discharged, as it is imperative that you return to your primary care physician (or establish a relationship with a primary care physician if you do not have one) for your aftercare needs so that they can reassess your need for medications and monitor your lab values.  You were cared for by a hospitalist during your hospital stay. If you have any questions about your  discharge medications or the care you received while you were in the hospital after you are discharged, you can call the unit and asked to speak with the hospitalist on call if the hospitalist that took care of you is not available. Once you are discharged, your primary care physician will handle any further medical issues. Please note that NO REFILLS for any discharge medications will be authorized once you are discharged, as it is imperative that you return to your primary care physician (or establish a relationship with a primary care physician if you do not have one) for your aftercare needs so that they can reassess your need for medications and monitor your lab values.  Please request your Prim.MD to go over all Hospital Tests and Procedure/Radiological results at the follow up, please get all Hospital records sent to your Prim MD by signing hospital release before you go home.  Get CBC, CMP, 2 view Chest X ray checked  by Primary MD during your next visit or SNF MD in 5-7 days ( we routinely change or add medications that can affect your baseline labs and fluid status, therefore we recommend that you get the mentioned basic workup next visit with your PCP, your PCP may decide not to get them or add new tests based on their clinical decision)  On your next visit with your primary care physician please Get Medicines reviewed and adjusted.  If you experience worsening of your admission symptoms, develop shortness of breath, life threatening emergency, suicidal or homicidal thoughts you must seek medical attention immediately by calling 911 or calling your MD immediately  if symptoms less severe.  You Must read complete instructions/literature along with all the possible adverse reactions/side effects for all the Medicines you take and that have been prescribed to you. Take any new Medicines after you have completely understood and accpet all the possible adverse reactions/side effects.   Do not  drive, operate heavy machinery, perform activities at heights, swimming or participation in water activities or provide baby sitting services if your were admitted for syncope or siezures until you have seen by Primary MD or a Neurologist and advised to do so again.  Do not drive when taking Pain medications.   Procedures/Studies: CT L-SPINE NO CHARGE Result Date: 06/16/2024 EXAM: CT OF THE LUMBAR SPINE WITHOUT CONTRAST 06/16/2024 05:42:00 PM TECHNIQUE: CT of the lumbar spine was  performed without the administration of intravenous contrast. Multiplanar reformatted images are provided for review. Automated exposure control, iterative reconstruction, and/or weight based adjustment of the mA/kV was utilized to reduce the radiation dose to as low as reasonably achievable. COMPARISON: 06/10/2019 CLINICAL HISTORY: FINDINGS: BONES AND ALIGNMENT: Normal vertebral body heights. Posterior fusion changes from L3 to S1. Posterior left 10th rib fracture noted. Normal alignment. DEGENERATIVE CHANGES: Spurring in the lower thoracic and upper lumbar spine. SOFT TISSUES: No acute abnormality. IMPRESSION: 1. Posterior left 10th rib fracture. 2. Postoperative and degenerative changes in the lumbar spine. Electronically signed by: Franky Crease MD 06/16/2024 06:14 PM EST RP Workstation: [id number]   CT T-SPINE NO CHARGE Result Date: 06/16/2024 EXAM: CT THORACIC SPINE WITHOUT CONTRAST 06/16/2024 05:42:00 PM TECHNIQUE: CT of the thoracic spine was performed without the administration of intravenous contrast. Multiplanar reformatted images are provided for review. Automated exposure control, iterative reconstruction, and/or weight based adjustment of the mA/kV was utilized to reduce the radiation dose to as low as reasonably achievable. COMPARISON: Comparison to today's CT chest, abdomen, and pelvis. CLINICAL HISTORY: FINDINGS: BONES AND ALIGNMENT: Normal vertebral body heights. No acute fracture or suspicious bone lesion. Normal  alignment. DEGENERATIVE CHANGES: Flowing anterior and right lateral osteophytes. SOFT TISSUES: No acute abnormality. IMPRESSION: 1. No acute abnormality of the thoracic spine. Electronically signed by: Franky Crease MD 06/16/2024 06:12 PM EST RP Workstation: [id number]   CT CHEST ABDOMEN PELVIS WO CONTRAST Result Date: 06/16/2024 EXAM: CT CHEST, ABDOMEN AND PELVIS WITHOUT CONTRAST 06/16/2024 05:42:00 PM TECHNIQUE: CT of the chest, abdomen and pelvis was performed without the administration of intravenous contrast. Multiplanar reformatted images are provided for review. Automated exposure control, iterative reconstruction, and/or weight based adjustment of the mA/kV was utilized to reduce the radiation dose to as low as reasonably achievable. COMPARISON: 07/07/2023 CLINICAL HISTORY: Polytrauma, blunt; fall on to left side, diffuse abdominal pain. FINDINGS: CHEST: MEDIASTINUM AND LYMPH NODES: Pacer in place with leads in the right heart. Coronary artery and aortic atherosclerosis. The central airways are clear. No mediastinal, hilar or axillary lymphadenopathy. LUNGS AND PLEURA: Trace left pleural effusion and left base atelectasis. No focal consolidation or pulmonary edema. No pneumothorax. ABDOMEN AND PELVIS: LIVER: The liver is unremarkable. GALLBLADDER AND BILE DUCTS: Gallbladder is unremarkable. No biliary ductal dilatation. SPLEEN: No acute abnormality. PANCREAS: No acute abnormality. ADRENAL GLANDS: No acute abnormality. KIDNEYS, URETERS AND BLADDER: No stones in the kidneys or ureters. No hydronephrosis. No perinephric or periureteral stranding. Urinary bladder is unremarkable. GI AND BOWEL: Stomach demonstrates no acute abnormality. Diverticulosis. There is no bowel obstruction. APPENDIX: Normal appendix. REPRODUCTIVE ORGANS: Prostate enlargement. PERITONEUM AND RETROPERITONEUM: No ascites. No free air. VASCULATURE: Aorta is normal in caliber. Aortic atherosclerosis. ABDOMINAL AND PELVIS LYMPH NODES: No  lymphadenopathy. BONES AND SOFT TISSUES: Posterior left 9th and 10th rib fractures. Postoperative and degenerative changes in the lumbar spine. No focal soft tissue abnormality. IMPRESSION: 1. Posterior left 9th and 10th rib fractures. 2. Trace left pleural effusion with left basilar atelectasis. 3. No acute findings in the abdomen or pelvis. 4. Scattered colonic diverticulosis. 5. Prostate enlargement. Electronically signed by: Franky Crease MD 06/16/2024 06:10 PM EST RP Workstation: [id number]   CT HEAD WO CONTRAST Result Date: 06/16/2024 CLINICAL DATA:  Moderate head/neck trauma from fall. EXAM: CT HEAD WITHOUT CONTRAST CT CERVICAL SPINE WITHOUT CONTRAST TECHNIQUE: Multidetector CT imaging of the head and cervical spine was performed following the standard protocol without intravenous contrast. Multiplanar CT image reconstructions of the cervical spine were also generated.  RADIATION DOSE REDUCTION: This exam was performed according to the departmental dose-optimization program which includes automated exposure control, adjustment of the mA and/or kV according to patient size and/or use of iterative reconstruction technique. COMPARISON:  Head CT 12/28/2023 FINDINGS: CT HEAD FINDINGS Brain: Ventricles, cisterns and other CSF spaces are normal. There is no mass, mass effect, shift of midline structures or acute hemorrhage. Mild chronic ischemic microvascular disease is present. Vascular: No hyperdense vessel or unexpected calcification. Skull: Normal. Negative for fracture or focal lesion. Sinuses/Orbits: No acute finding. Other: None. CT CERVICAL SPINE FINDINGS Alignment: Normal. Skull base and vertebrae: Moderate spondylosis throughout the cervical spine to include uncovertebral joint spurring and facet arthropathy. Vertebral body heights are maintained. Atlantoaxial articulation is unremarkable. No acute fracture. Bilateral neural foraminal narrowing at the C4-5 level right worse than left. Bilateral neural  foraminal narrowing at the C5-6 and C6-7 levels. Soft tissues and spinal canal: Minimal canal stenosis at the C6-7 level and to lesser extent at the C5-6 and C2-3 levels. Prevertebral soft tissues are normal. Disc levels: Moderate disc space narrowing at the C5-6 and C6-7 levels and to lesser extent at the C4-5 level. Upper chest: No acute findings. Other: None. IMPRESSION: 1. No acute brain injury. 2. Mild chronic ischemic microvascular disease. 3. No acute cervical spine injury. 4. Moderate spondylosis throughout the cervical spine with multilevel disc disease, multilevel neural foraminal narrowing and mild canal stenosis as described. Electronically Signed   By: Toribio Agreste M.D.   On: 06/16/2024 16:58   CT CERVICAL SPINE WO CONTRAST Result Date: 06/16/2024 CLINICAL DATA:  Moderate head/neck trauma from fall. EXAM: CT HEAD WITHOUT CONTRAST CT CERVICAL SPINE WITHOUT CONTRAST TECHNIQUE: Multidetector CT imaging of the head and cervical spine was performed following the standard protocol without intravenous contrast. Multiplanar CT image reconstructions of the cervical spine were also generated. RADIATION DOSE REDUCTION: This exam was performed according to the departmental dose-optimization program which includes automated exposure control, adjustment of the mA and/or kV according to patient size and/or use of iterative reconstruction technique. COMPARISON:  Head CT 12/28/2023 FINDINGS: CT HEAD FINDINGS Brain: Ventricles, cisterns and other CSF spaces are normal. There is no mass, mass effect, shift of midline structures or acute hemorrhage. Mild chronic ischemic microvascular disease is present. Vascular: No hyperdense vessel or unexpected calcification. Skull: Normal. Negative for fracture or focal lesion. Sinuses/Orbits: No acute finding. Other: None. CT CERVICAL SPINE FINDINGS Alignment: Normal. Skull base and vertebrae: Moderate spondylosis throughout the cervical spine to include uncovertebral joint  spurring and facet arthropathy. Vertebral body heights are maintained. Atlantoaxial articulation is unremarkable. No acute fracture. Bilateral neural foraminal narrowing at the C4-5 level right worse than left. Bilateral neural foraminal narrowing at the C5-6 and C6-7 levels. Soft tissues and spinal canal: Minimal canal stenosis at the C6-7 level and to lesser extent at the C5-6 and C2-3 levels. Prevertebral soft tissues are normal. Disc levels: Moderate disc space narrowing at the C5-6 and C6-7 levels and to lesser extent at the C4-5 level. Upper chest: No acute findings. Other: None. IMPRESSION: 1. No acute brain injury. 2. Mild chronic ischemic microvascular disease. 3. No acute cervical spine injury. 4. Moderate spondylosis throughout the cervical spine with multilevel disc disease, multilevel neural foraminal narrowing and mild canal stenosis as described. Electronically Signed   By: Toribio Agreste M.D.   On: 06/16/2024 16:58   DG Shoulder Left Result Date: 06/16/2024 CLINICAL DATA:  Left shoulder pain post fall. EXAM: LEFT SHOULDER - 2+ VIEW COMPARISON:  None Available. FINDINGS: Mild degenerate change over the [facility] joint and glenohumeral joints. No evidence of acute fracture or dislocation. IMPRESSION: 1. No acute findings. 2. Mild degenerative changes. Electronically Signed   By: Toribio Agreste M.D.   On: 06/16/2024 16:49     The results of significant diagnostics from this hospitalization (including imaging, microbiology, ancillary and laboratory) are listed below for reference.     Microbiology: No results found for this or any previous visit (from the past 240 hours).   Labs: BNP (last 3 results) Recent Labs    12/28/23 1733 01/19/24 1603 03/09/24 1050  BNP 104.4* 212.6* 268.9*   Basic Metabolic Panel: Recent Labs  Lab 06/16/24 1530 06/16/24 1546 06/16/24 2220 06/17/24 0348 06/18/24 0247  NA 135 138 137 136 134*  K 4.9 5.0 5.0 4.2 4.8  CL 104 105 104 104 101  CO2 21*  --  23  24 22   GLUCOSE 81 79 79 85 88  BUN 32* 34* 29* 27* 20  CREATININE 2.35* 2.50* 2.16* 2.07* 2.06*  CALCIUM  8.7*  --  8.9 8.7* 8.8*  MG  --   --  2.2 2.2 2.0  PHOS  --   --  2.6 2.8  --    Liver Function Tests: Recent Labs  Lab 06/16/24 1530 06/17/24 0348  AST 30 28  ALT 27 26  ALKPHOS 151* 117  BILITOT 0.5 0.6  PROT 7.7 7.5  ALBUMIN  3.2* 3.1*   No results for input(s): LIPASE, AMYLASE in the last 168 hours. No results for input(s): AMMONIA in the last 168 hours. CBC: Recent Labs  Lab 06/16/24 1530 06/16/24 1546 06/17/24 0348 06/18/24 0247  WBC 5.1  --  3.9* 4.4  NEUTROABS 3.0  --   --   --   HGB 13.3 14.3 12.8* 13.1  HCT 42.1 42.0 40.1 43.2  MCV 88.8  --  87.9 90.8  PLT 89*  --  81* 79*   Cardiac Enzymes: Recent Labs  Lab 06/16/24 2220  CKTOTAL 232   BNP: Invalid input(s): POCBNP CBG: Recent Labs  Lab 06/17/24 1658 06/17/24 1949 06/18/24 0033 06/18/24 0350 06/18/24 0746  GLUCAP 86 114* 114* 128* 92   D-Dimer No results for input(s): DDIMER in the last 72 hours. Hgb A1c Recent Labs    06/16/24 1530  HGBA1C 5.6   Lipid Profile No results for input(s): CHOL, HDL, LDLCALC, TRIG, CHOLHDL, LDLDIRECT in the last 72 hours. Thyroid  function studies No results for input(s): TSH, T4TOTAL, T3FREE, THYROIDAB in the last 72 hours.  Invalid input(s): FREET3 Anemia work up No results for input(s): VITAMINB12, FOLATE, FERRITIN, TIBC, IRON, RETICCTPCT in the last 72 hours. Urinalysis    Component Value Date/Time   COLORURINE YELLOW 06/17/2024 0430   APPEARANCEUR CLEAR 06/17/2024 0430   APPEARANCEUR Clear 12/16/2022 1150   LABSPEC 1.009 06/17/2024 0430   PHURINE 6.0 06/17/2024 0430   GLUCOSEU >=500 (A) 06/17/2024 0430   HGBUR SMALL (A) 06/17/2024 0430   BILIRUBINUR NEGATIVE 06/17/2024 0430   BILIRUBINUR Negative 12/16/2022 1150   KETONESUR NEGATIVE 06/17/2024 0430   PROTEINUR NEGATIVE 06/17/2024 0430    UROBILINOGEN 0.2 12/31/2010 1003   NITRITE NEGATIVE 06/17/2024 0430   LEUKOCYTESUR NEGATIVE 06/17/2024 0430   Sepsis Labs Recent Labs  Lab 06/16/24 1530 06/17/24 0348 06/18/24 0247  WBC 5.1 3.9* 4.4   Microbiology No results found for this or any previous visit (from the past 240 hours).   Time coordinating discharge:  I have spent 35 minutes face to face  with the patient and on the ward discussing the patients care, assessment, plan and disposition with other care givers. >50% of the time was devoted counseling the patient about the risks and benefits of treatment/Discharge disposition and coordinating care.   SIGNED:   Burgess JAYSON Dare, MD  [facility] 06/18/2024, 12:26 PM   If 7PM-7AM, please contact night-coverage

## 2024-06-21 ENCOUNTER — Encounter

## 2024-07-06 ENCOUNTER — Telehealth (HOSPITAL_COMMUNITY): Payer: Self-pay

## 2024-07-06 NOTE — Telephone Encounter (Signed)
 Called to confirm/remind patient of their appointment [facility] on 07/07/24.   Appointment:   [x] Confirmed  [] Left mess   [] No answer/No voice mail  [] VM Full/unable to leave message  [] Phone not in service  Patient reminded to bring all medications and/or complete list.  Confirmed patient has transportation. Gave directions, instructed to utilize valet parking.

## 2024-07-07 ENCOUNTER — Ambulatory Visit (HOSPITAL_COMMUNITY)
Admission: RE | Admit: 2024-07-07 | Discharge: 2024-07-07 | Disposition: A | Discharge status: Home / Self Care | Source: Ambulatory Visit | Attending: Cardiology | Admitting: Cardiology

## 2024-07-07 ENCOUNTER — Encounter (HOSPITAL_COMMUNITY): Payer: Self-pay

## 2024-07-07 ENCOUNTER — Ambulatory Visit (HOSPITAL_COMMUNITY): Payer: Self-pay | Admitting: Cardiology

## 2024-07-07 VITALS — BP 118/70 | HR 56 | Ht 72.0 in | Wt 226.4 lb

## 2024-07-07 DIAGNOSIS — E1122 Type 2 diabetes mellitus with diabetic chronic kidney disease: Secondary | ICD-10-CM | POA: Insufficient documentation

## 2024-07-07 DIAGNOSIS — Z7982 Long term (current) use of aspirin: Secondary | ICD-10-CM | POA: Insufficient documentation

## 2024-07-07 DIAGNOSIS — I5022 Chronic systolic (congestive) heart failure: Secondary | ICD-10-CM | POA: Insufficient documentation

## 2024-07-07 DIAGNOSIS — I2510 Atherosclerotic heart disease of native coronary artery without angina pectoris: Secondary | ICD-10-CM | POA: Diagnosis not present

## 2024-07-07 DIAGNOSIS — I428 Other cardiomyopathies: Secondary | ICD-10-CM | POA: Diagnosis not present

## 2024-07-07 DIAGNOSIS — Z7984 Long term (current) use of oral hypoglycemic drugs: Secondary | ICD-10-CM | POA: Insufficient documentation

## 2024-07-07 DIAGNOSIS — I472 Ventricular tachycardia, unspecified: Secondary | ICD-10-CM | POA: Diagnosis not present

## 2024-07-07 DIAGNOSIS — N183 Chronic kidney disease, stage 3 unspecified: Secondary | ICD-10-CM | POA: Diagnosis not present

## 2024-07-07 DIAGNOSIS — F1911 Other psychoactive substance abuse, in remission: Secondary | ICD-10-CM | POA: Insufficient documentation

## 2024-07-07 DIAGNOSIS — I130 Hypertensive heart and chronic kidney disease with heart failure and stage 1 through stage 4 chronic kidney disease, or unspecified chronic kidney disease: Secondary | ICD-10-CM | POA: Insufficient documentation

## 2024-07-07 DIAGNOSIS — I447 Left bundle-branch block, unspecified: Secondary | ICD-10-CM | POA: Diagnosis not present

## 2024-07-07 DIAGNOSIS — J439 Emphysema, unspecified: Secondary | ICD-10-CM | POA: Diagnosis not present

## 2024-07-07 DIAGNOSIS — Z95810 Presence of automatic (implantable) cardiac defibrillator: Secondary | ICD-10-CM | POA: Insufficient documentation

## 2024-07-07 DIAGNOSIS — Z87891 Personal history of nicotine dependence: Secondary | ICD-10-CM | POA: Insufficient documentation

## 2024-07-07 DIAGNOSIS — F1910 Other psychoactive substance abuse, uncomplicated: Secondary | ICD-10-CM

## 2024-07-07 DIAGNOSIS — Z79899 Other long term (current) drug therapy: Secondary | ICD-10-CM | POA: Insufficient documentation

## 2024-07-07 LAB — BRAIN NATRIURETIC PEPTIDE: B Natriuretic Peptide: 79.3 pg/mL (ref 0.0–100.0)

## 2024-07-07 LAB — COMPREHENSIVE METABOLIC PANEL WITH GFR
ALT: 27 U/L (ref 0–44)
AST: 32 U/L (ref 15–41)
Albumin: 3.6 g/dL (ref 3.5–5.0)
Alkaline Phosphatase: 137 U/L — ABNORMAL HIGH (ref 38–126)
Anion gap: 11 (ref 5–15)
BUN: 26 mg/dL — ABNORMAL HIGH (ref 8–23)
CO2: 27 mmol/L (ref 22–32)
Calcium: 10.4 mg/dL — ABNORMAL HIGH (ref 8.9–10.3)
Chloride: 101 mmol/L (ref 98–111)
Creatinine, Ser: 1.83 mg/dL — ABNORMAL HIGH (ref 0.61–1.24)
GFR, Estimated: 39 mL/min — ABNORMAL LOW (ref >60)
Glucose, Bld: 101 mg/dL — ABNORMAL HIGH (ref 70–99)
Potassium: 4.3 mmol/L (ref 3.5–5.1)
Sodium: 139 mmol/L (ref 135–145)
Total Bilirubin: 0.7 mg/dL (ref 0.0–1.2)
Total Protein: 9.2 g/dL — ABNORMAL HIGH (ref 6.5–8.1)

## 2024-07-07 LAB — TSH: TSH: 1.064 u[IU]/mL (ref 0.350–4.500)

## 2024-07-07 NOTE — Patient Instructions (Addendum)
 Good to see you today!   Labs done today, your results will be available in MyChart, we will contact you for abnormal readings.  Your physician recommends that you schedule a follow-up appointment as scheduled  If you have any questions or concerns before your next appointment please send us  a message through [facility] or call our office at [phone].    TO LEAVE A MESSAGE FOR THE NURSE SELECT OPTION 2, PLEASE LEAVE A MESSAGE INCLUDING: YOUR NAME DATE OF BIRTH CALL BACK NUMBER REASON FOR CALL**this is important as we prioritize the call backs  YOU WILL RECEIVE A CALL BACK THE SAME DAY AS LONG AS YOU CALL BEFORE 4:00 PM At the [facility], you and your health needs are our priority. As part of our continuing mission to provide you with exceptional heart care, we have created designated Provider Care Teams. These Care Teams include your primary Cardiologist (physician) and Advanced Practice Providers (APPs- Physician Assistants and Nurse Practitioners) who all work together to provide you with the care you need, when you need it.   You may see any of the following providers on your designated Care Team at your next follow up: Dr Toribio Fuel Dr Ezra Shuck Dr. Morene Brownie Greig Mosses, NP Caffie Shed, [place] [facility] [place], [place] Beckey Coe, NP Jordan Lee, NP Ellouise Class, NP Tinnie Redman, PharmD Jaun Bash, PharmD   Please be sure to bring in all your medications bottles to every appointment.    Thank you for choosing [facility]

## 2024-07-07 NOTE — Progress Notes (Signed)
 [facility] Note   Primary Care: Leontine Cramp, NP HF Cardiologist: Dr. Rolan   HPI: Edward Mullen is a 68 y.o. male with history of HTN, DM, chronic systolic CHF, and polysubstance abuse. Former heavy smoker. Quit 2024.   Admitted 08/23/16 with acute systolic CHF in setting of substance abuse. Echo was done, showing EF 15-20%.  Diuresed with IV lasix  and meds adjusted as tolerated.  UDS + for cocaine  on admission. Cardiac cath showed coronary disease but not significant enough to explain cardiomyopathy.   He was admitted with syncope in 5/18 after using cocaine .  He was noted to have NSVT on telemetry.  Syncope suspected due to VT, no ICD given active substance abuse but had Lifevest placed. EF 50% on 8/18 echo,, so Lifevest subsequently removed.  He started using cocaine  again and stopped his cardiac medications. In 4/22, he was admitted with acute on chronic systolic CHF.  Frequent NSVT was noted and amiodarone  was started.  He had echo showing EF 20-25% with normal RV.  LHC showed 80% PLV stenosis, managed medically.   Admitted 3/23 with a/c CHF and PNA. Echo with EF 20-25%, RV mildly reduced. Started on milrinone  and IV lasix . RHC showed mildly elevated PCWP and LVEDP, CI 1.9. Milrinone  weaned off and GDMT titrated. Had NSVT/VT, discharged home with LifeVest, weight 202 lbs.  Echo 8/23 showed EF 25-30%, global hypokinesis, moderate LV dilation, mild LVH, mildly decreased RV systolic function. Referred to EP for CRT-D consideration.  S/P St Jude CRT-D 12/23. .  Admitted 4/24 with OOH arrest, found unresponsive in his car. Required 8 mins CPR. Suspected cocaine  overdose.  UDS + for benzos, THC, and cocaine . Developed cardiogenic shock postcardiac arrest. Required intubation and pressors in CCU. + for rhinovirus during admission. Pressors weaned off. Discharged home on coreg , mexilitine, amiodarone , and torsemide . Losartan  and spironolactone  held with hyperkalemia.    Admitted 7/24 with lightheadedness, hypotension and syncope. GDMT initially held but was all restarted at discharge. Suspected dehydration/hypovolemia. Had acute urinary retention and right-sided epididymitis, discharged with foley. Torsemide  was decreased from 20 mg daily to 10 mg 3x/week. Discharge weight 181 lbs.   Admitted 2/25 for VT successfully treated with ATP. UDS + cocaine  and THC. EP consulted, and amiodarone  increased to 200 bid x 10 days then back to 200 mg daily.   RV lead extraction/revision 12/22/23 for RV lead fracture.  Echo 6/25 showed septal dyssynchrony due to LBBB. EF 30-35% with gHK, mild LVH, G1DD, nl RV.  Most recently admitted 11/25 after a ground level fall. CT showed rib fractures. Found to have AKI and was fluid resuscitated.   He returns today for heart failure follow up. Overall feeling good from a cardiac standpoint. No SOB, CP, dizziness, or orthopnea. Has pain at his ribs from where he fell. He had been playing with his grandkids and tripped over a toy. Also has pain on his R shoulder from shingles that he has had since April. Able to perform ADLs, walks with cane. Lives with his daughter and grandchildren. No ETOH/cocaine . Smokin marijuana occasionally. Appetite okay. Weight at home stable at 220 lb. Compliant with all medications, receives bubble packs. Takes an additional torsemide  ~1x/week for edema.  Family History  Problem Relation Age of Onset   Stroke Mother 68   Heart disease Father 1   Colon cancer Neg Hx    Colon polyps Neg Hx    Esophageal cancer Neg Hx    Stomach cancer Neg Hx  Rectal cancer Neg Hx    Past Medical History: 1. HTN  2. Type II diabetes 3. Cocaine  abuse 4. COPD: Prior smoker. PFTs (1/25) with moderate airways obstruction.  5. Cardiomyopathy: Nonischemic. St Jude CRT-D device.  - Echo (1/18) with EF 15-20%, moderate LVH, moderate AI, moderate to severe MR, normal RV size with mildly decreased systolic function, PASP 44  mmHg.  - LHC/RHC (1/18): 80% stenosis in PLV branch.  Mean RA 5, PA 47/20 mean 32, mean PCWP 22, CI 2.01.  - Echo (8/18): EF 50% with moderate LVH, diffuse hypokinesis, normal RV size and systolic function, mild AI, trivial MR.  - Echo (4/22): EF 20-25%, severe LV dilation, normal RV. - Echo (2/23):  EF 20-25%, RV mildly reduced.  - Echo (8/23): EF 25-30%, global hypokinesis, moderate LV dilation, mild LVH, mildly decreased RV systolic function.  - Echo 4/24 EF25-30%, normal RV.  - Echo 7/24 EF <20%, GIDD, RV mildly reduced systolic function, mild MR.  - Echo 06/25: EF 30-35%, septal dyssynchrony, RV okay 6. CAD: LHC (1/18) with 80% stenosis in a branch of the PLV (not enough CAD to explain cardiomyopathy).  - LHC (4/22): PLV 80%, 25% mLAD.  - R/LHC (2/23): 1st RPL 80%, 30% mLAD; mildly elevated PCWP and LVEDP, normal RA pressures, CI 1.9 7. Mitral regurgitation: Moderate to severe on 1/18 echo, probably functional.  Trivial MR on 8/18 echo.  8. CKD: Stage 3.  9. Syncope: 5/18 in setting of cocaine  use, concern for VT.  10. Lower extremity arterial dopplers (7/18): No significant PAD.  11. Sciatica 12. NSVT 13. CKD stage 3  Current Outpatient Medications  Medication Sig Dispense Refill   acetaminophen  (TYLENOL ) 500 MG tablet Take 1,000 mg by mouth every 6 (six) hours as needed for mild pain (pain score 1-3) or moderate pain (pain score 4-6).     albuterol  (VENTOLIN  HFA) 108 (90 Base) MCG/ACT inhaler INHALE 1 TO 2 PUFFS BY MOUTH EVERY 6 HOURS AS NEEDED FOR WHEEZING OR SHORTNESS OF BREATH 18 g 2   amiodarone  (PACERONE ) 200 MG tablet Take 1 tablet (200 mg total) by mouth daily. 90 tablet 3   ASPIRIN  LOW DOSE 81 MG tablet TAKE 1 TABLET (81 MG TOTAL) BY MOUTH DAILY. SWALLOW WHOLE(AM) 30 tablet 11   carvedilol  (COREG ) 3.125 MG tablet Take 1 tablet (3.125 mg total) by mouth 2 (two) times daily. 60 tablet 11   gabapentin  (NEURONTIN ) 600 MG tablet Take 600 mg by mouth 2 (two) times daily.      HYDROcodone -acetaminophen  (NORCO/VICODIN) 5-325 MG tablet Take 1-2 tablets by mouth every 6 (six) hours as needed for severe pain (pain score 7-10). 30 tablet 0   JARDIANCE  10 MG TABS tablet Take 10 mg by mouth daily.     lidocaine  (LIDODERM ) 5 % Place 2 patches onto the skin daily. Remove & Discard patch within 12 hours or as directed by MD.  Apply to the left rib area 60 patch 0   losartan  (COZAAR ) 25 MG tablet Take 25 mg by mouth daily.     mexiletine (MEXITIL ) 150 MG capsule TAKE 1 CAPSULE BY MOUTH 2 (TWO) TIMES DAILY (AM+BEDTIME) 60 capsule 6   nortriptyline  (PAMELOR ) 75 MG capsule Take 75 mg by mouth daily.     polyethylene glycol powder (GLYCOLAX /MIRALAX ) 17 GM/SCOOP powder Take 17 g by mouth 2 (two) times daily as needed for moderate constipation or severe constipation. Dissolve 1 capful (17g) in 4-8 ounces of liquid and take by mouth daily. 238 g 0  rosuvastatin  (CRESTOR ) 40 MG tablet Take 1 tablet (40 mg total) by mouth daily. 30 tablet 6   senna (SENOKOT) 8.6 MG TABS tablet Take 2 tablets (17.2 mg total) by mouth at bedtime. 60 tablet 0   spironolactone  (ALDACTONE ) 25 MG tablet Take 1 tablet (25 mg total) by mouth daily. 90 tablet 3   STIOLTO RESPIMAT 2.5-2.5 MCG/ACT AERS Inhale 2 puffs into the lungs daily.     tamsulosin  (FLOMAX ) 0.4 MG CAPS capsule Take 0.4 mg by mouth at bedtime.     torsemide  (DEMADEX ) 20 MG tablet TAKE 1 TABLET (20 MG TOTAL) BY MOUTH DAILY (AM) 30 tablet 3   No current facility-administered medications for this visit.   No Known Allergies  There were no vitals taken for this visit.  Wt Readings from Last 3 Encounters:  06/16/24 107.1 kg (236 lb 1.8 oz)  04/08/24 101.6 kg (224 lb)  03/09/24 98.2 kg (216 lb 9.6 oz)    PHYSICAL EXAM: General: Elderly appearing. No distress  Cardiac: JVP ~7 cm. No murmurs  Resp: Lung sounds clear and equal B/L Extremities: Warm and dry.  No peripheral edema.  Neuro: A&O x3. Affect pleasant.   St Jude device interrogation  (personally reviewed): increased thoracic impedence at the beginning of the month, 94% BiV pacing, 0 AT/AF, 0 VT/SVT/VF  ECG (personally reviewed from 06/16/24): NSR with LBBB, QRS 208 ms  ASSESSMENT & PLAN: 1. Chronic systolic CHF:  EF 15-20% with moderate to severe MR on echo 1/18.  Nonischemic cardiomyopathy, has St Jude CRT-D device s/p RV lead revision for fracture in 5/25. HTN vs cocaine  use vs viral vs ETOH. HIV negative, SPEP with no M-spike.  Patient has been noted to have CAD but not enough to explain cardiomyopathy (Cath 2/23 admission with stable 80% PLV stenosis).  Echo in 8/18 showed EF up to 50%. However, he stopped his meds and started cocaine  again, echo in 4/22 with EF back down to 20-25% => suspect cocaine  abuse may be the primary etiology of cardiomyopathy. Echo in 2/23 with EF 20-25%, RV mildly reduced. RHC (3/23) with mildly elevated PCWP and LVEDP, CI 1.9. Echo (8/23) showed EF 25-30%, global hypokinesis, moderate LV dilation, mild LVH, mildly decreased RV systolic function. Echo 10/23 EF 20-25% RV moderately reduced.  Echo 7/24 EF < 20%, GIDD, RV mildly reduced.  Echo 6/25: EF 30-35%, septal dyssynchrony d/t LBBB.  - NYHA II. Volume ok on exam. Continue Torsemide  20 mg every other day. Take additional 20 mg PRN. - continue spironolactone  to 25 mg daily - continue losartan  25 mg daily. Previously intolerant to Entresto  d/t orthostasis, prior syncopal episodes.  - continue carvedilol  3.125 mg bid. Would not titrate up with HR in 50s - continue Jardiance  10 mg daily.  - CMET/BNP 2.VT/NSVT: Multiple runs during 3/23 admission, longest was 28 beats. Coronary angiography showed no change in coronaries. Has St Jude CRT-D. ATP on 09/30/23. No episodes on device interrogation. - Continue amiodarone  200 mg daily  TSH and LFTs today.  He will need a regular eye exam.   - Continue mexiletine 150 mg bid 3. CKD III: Baseline creatinine around 1.6. AKI during recent admission. Last Cr 2.06 -  Continue Jardiance  - BMET today 4. CAD: 80% stenosis PLV on cath in 2/23.  No chest pain.  - Continue ASA + statin. LDL ok 6/25 5. Polysubstance abuse: No recent cocaine , ETOH or tobacco use per patient. 6. COPD: PFTs 08/2023 with moderate airways obstruction => emphysema - Continue inhalers. 7.  LBBB: QRS 208 ms on last echo. Remains with LV dyssynchrony on echo 6/25.  - s/p MDT CRT-D in 2023 with RV lead revision in 5/25 - Will reach out to EP to determine if CRT-D is fully optimized.   Follow up in 4 month with APP  Chalonda Schlatter, NP 07/07/2024

## 2024-07-15 ENCOUNTER — Ambulatory Visit

## 2024-07-15 ENCOUNTER — Ambulatory Visit: Payer: Self-pay

## 2024-07-15 DIAGNOSIS — I472 Ventricular tachycardia, unspecified: Secondary | ICD-10-CM

## 2024-07-17 LAB — CUP PACEART REMOTE DEVICE CHECK
Battery Remaining Longevity: 57 mo
Battery Remaining Percentage: 70 %
Battery Voltage: 2.98 V
Brady Statistic AP VP Percent: 5.3 %
Brady Statistic AP VS Percent: 1 %
Brady Statistic AS VP Percent: 89 %
Brady Statistic AS VS Percent: 3.9 %
Brady Statistic RA Percent Paced: 4.3 %
Date Time Interrogation Session: 20251204235308
HighPow Impedance: 80 Ohm
HighPow Impedance: 80 Ohm
Lead Channel Impedance Value: 450 Ohm
Lead Channel Impedance Value: 640 Ohm
Lead Channel Impedance Value: 640 Ohm
Lead Channel Pacing Threshold Amplitude: 0.5 V
Lead Channel Pacing Threshold Amplitude: 1 V
Lead Channel Pacing Threshold Amplitude: 1.5 V
Lead Channel Pacing Threshold Pulse Width: 0.3 ms
Lead Channel Pacing Threshold Pulse Width: 0.3 ms
Lead Channel Pacing Threshold Pulse Width: 0.6 ms
Lead Channel Sensing Intrinsic Amplitude: 12 mV
Lead Channel Sensing Intrinsic Amplitude: 5 mV
Lead Channel Setting Pacing Amplitude: 1.5 V
Lead Channel Setting Pacing Amplitude: 2 V
Lead Channel Setting Pacing Amplitude: 2.5 V
Lead Channel Setting Pacing Pulse Width: 0.3 ms
Lead Channel Setting Pacing Pulse Width: 0.6 ms
Lead Channel Setting Sensing Sensitivity: 0.5 mV
Pulse Gen Serial Number: 5556126
Zone Setting Status: 755011

## 2024-07-20 NOTE — Progress Notes (Signed)
 Remote ICD Transmission

## 2024-07-29 ENCOUNTER — Ambulatory Visit: Payer: Self-pay | Admitting: Cardiology

## 2024-08-02 ENCOUNTER — Encounter: Payer: Self-pay | Admitting: Cardiology

## 2024-08-17 ENCOUNTER — Emergency Department (HOSPITAL_COMMUNITY)

## 2024-08-17 ENCOUNTER — Other Ambulatory Visit: Payer: Self-pay

## 2024-08-17 ENCOUNTER — Encounter (HOSPITAL_COMMUNITY): Payer: Self-pay

## 2024-08-17 ENCOUNTER — Emergency Department (HOSPITAL_COMMUNITY)
Admission: EM | Admit: 2024-08-17 | Discharge: 2024-08-18 | Disposition: A | Discharge status: Home / Self Care | Attending: Emergency Medicine | Admitting: Emergency Medicine

## 2024-08-17 DIAGNOSIS — Z79899 Other long term (current) drug therapy: Secondary | ICD-10-CM | POA: Insufficient documentation

## 2024-08-17 DIAGNOSIS — S22079A Unspecified fracture of T9-T10 vertebra, initial encounter for closed fracture: Secondary | ICD-10-CM

## 2024-08-17 DIAGNOSIS — R109 Unspecified abdominal pain: Secondary | ICD-10-CM | POA: Insufficient documentation

## 2024-08-17 DIAGNOSIS — M545 Low back pain, unspecified: Secondary | ICD-10-CM | POA: Diagnosis not present

## 2024-08-17 DIAGNOSIS — M546 Pain in thoracic spine: Secondary | ICD-10-CM | POA: Diagnosis not present

## 2024-08-17 DIAGNOSIS — W108XXA Fall (on) (from) other stairs and steps, initial encounter: Secondary | ICD-10-CM | POA: Diagnosis not present

## 2024-08-17 DIAGNOSIS — S0001XA Abrasion of scalp, initial encounter: Secondary | ICD-10-CM | POA: Insufficient documentation

## 2024-08-17 DIAGNOSIS — S0990XA Unspecified injury of head, initial encounter: Secondary | ICD-10-CM | POA: Diagnosis present

## 2024-08-17 DIAGNOSIS — W19XXXA Unspecified fall, initial encounter: Secondary | ICD-10-CM

## 2024-08-17 DIAGNOSIS — Z7982 Long term (current) use of aspirin: Secondary | ICD-10-CM | POA: Diagnosis not present

## 2024-08-17 MED ORDER — ACETAMINOPHEN 500 MG PO TABS
1000.0000 mg | ORAL_TABLET | Freq: Once | ORAL | Status: AC
  Administered 2024-08-18: 1000 mg via ORAL
  Filled 2024-08-17: qty 2

## 2024-08-17 NOTE — ED Provider Notes (Incomplete Revision)
 " [place] Provider Note   CSN: [id number] Arrival date & time: 08/17/24  2025     Patient presents with: Fall and Head Injury   Edward Mullen is a 69 y.o. male who presents emergency department with chief complaint of fall.  Patient reports that he lost his footing fell backward and hit his head on the edge of a step.he does not take blood thinners and denies LOC. UTD on his tetanus vaccine      Fall  Head Injury      Prior to Admission medications  Medication Sig Start Date End Date Taking? Authorizing Provider  acetaminophen  (TYLENOL ) 500 MG tablet Take 1,000 mg by mouth every 6 (six) hours as needed for mild pain (pain score 1-3) or moderate pain (pain score 4-6).    [provider]  albuterol  (VENTOLIN  HFA) 108 (90 Base) MCG/ACT inhaler INHALE 1 TO 2 PUFFS BY MOUTH EVERY 6 HOURS AS NEEDED FOR WHEEZING OR SHORTNESS OF BREATH 10/29/22   Elnora Ip, MD  amiodarone  (PACERONE ) 200 MG tablet Take 1 tablet (200 mg total) by mouth daily. 03/09/24   Colletta Manuelita Garre, PA-C  ASPIRIN  LOW DOSE 81 MG tablet TAKE 1 TABLET (81 MG TOTAL) BY MOUTH DAILY. SWALLOW WHOLE(AM) 01/15/24   Milford, Harlene HERO, FNP  carvedilol  (COREG ) 3.125 MG tablet Take 1 tablet (3.125 mg total) by mouth 2 (two) times daily. 01/29/22 07/07/25  Glena Harlene HERO, FNP  gabapentin  (NEURONTIN ) 600 MG tablet Take 600 mg by mouth 2 (two) times daily.    [provider]  HYDROcodone -acetaminophen  (NORCO/VICODIN) 5-325 MG tablet Take 1-2 tablets by mouth every 6 (six) hours as needed for severe pain (pain score 7-10). Patient not taking: Reported on 07/07/2024 06/18/24   Caleen Burgess BROCKS, MD  JARDIANCE  10 MG TABS tablet Take 10 mg by mouth daily. 04/03/23   [provider]  lidocaine  (LIDODERM ) 5 % Place 2 patches onto the skin daily. Remove & Discard patch within 12 hours or as directed by MD.  Apply to the left rib area 06/18/24   Amin, Ankit  C, MD  losartan  (COZAAR ) 25 MG tablet Take 25 mg by mouth daily.    [provider]  mexiletine (MEXITIL ) 150 MG capsule TAKE 1 CAPSULE BY MOUTH 2 (TWO) TIMES DAILY (AM+BEDTIME) 06/09/24   Rolan Ezra RAMAN, MD  nortriptyline  (PAMELOR ) 75 MG capsule Take 75 mg by mouth daily. 06/07/24   [provider]  polyethylene glycol powder (GLYCOLAX /MIRALAX ) 17 GM/SCOOP powder Take 17 g by mouth 2 (two) times daily as needed for moderate constipation or severe constipation. Dissolve 1 capful (17g) in 4-8 ounces of liquid and take by mouth daily. 06/18/24   Amin, Ankit C, MD  rosuvastatin  (CRESTOR ) 40 MG tablet Take 1 tablet (40 mg total) by mouth daily. 01/22/24   Rolan Ezra RAMAN, MD  senna (SENOKOT) 8.6 MG TABS tablet Take 2 tablets (17.2 mg total) by mouth at bedtime. Patient not taking: Reported on 07/07/2024 06/18/24   Caleen Burgess BROCKS, MD  spironolactone  (ALDACTONE ) 25 MG tablet Take 1 tablet (25 mg total) by mouth daily. 03/09/24   Colletta Manuelita Garre, PA-C  STIOLTO RESPIMAT 2.5-2.5 MCG/ACT AERS Inhale 2 puffs into the lungs daily. 06/11/23   [provider]  tamsulosin  (FLOMAX ) 0.4 MG CAPS capsule Take 0.4 mg by mouth at bedtime.    [provider]  torsemide  (DEMADEX ) 20 MG tablet TAKE 1 TABLET (20 MG TOTAL) BY MOUTH DAILY (  AM) 06/09/24   Rolan Ezra RAMAN, MD  zolpidem  (AMBIEN ) 10 MG tablet Take 1 tablet (10 mg total) by mouth at bedtime as needed for sleep. 09/16/22 01/03/23      Allergies: Patient has no known allergies.    Review of Systems  Updated Vital Signs BP 118/82   Temp 98.4 F (36.9 C) (Oral)   Resp (!) 22   SpO2 95%   Physical Exam Vitals and nursing note reviewed.  Constitutional:      General: He is not in acute distress.    Appearance: He is well-developed. He is not diaphoretic.  HENT:     Head: Normocephalic.     Comments: Hematoma and abrasion to the post occiput- no laceration Eyes:     General: No scleral icterus.     Conjunctiva/sclera: Conjunctivae normal.  Cardiovascular:     Rate and Rhythm: Normal rate and regular rhythm.     Heart sounds: Normal heart sounds.  Pulmonary:     Effort: Pulmonary effort is normal. No respiratory distress.     Breath sounds: Normal breath sounds.  Abdominal:     Palpations: Abdomen is soft.     Tenderness: There is no abdominal tenderness.  Musculoskeletal:     Cervical back: Normal range of motion and neck supple.     Comments: Midline spinal tenderness lower thoracic upper lumbar   Skin:    General: Skin is warm and dry.  Neurological:     General: No focal deficit present.     Mental Status: He is alert and oriented to person, place, and time.     Cranial Nerves: No cranial nerve deficit.     Sensory: No sensory deficit.     Motor: No weakness.     Coordination: Coordination normal.  Psychiatric:        Behavior: Behavior normal.     (all labs ordered are listed, but only abnormal results are displayed) Labs Reviewed - No data to display  EKG: None  Radiology: No results found.   Procedures   Medications Ordered in the ED - No data to display                                  Medical Decision Making Amount and/or Complexity of Data Reviewed Radiology: ordered.  Risk OTC drugs.        Final diagnoses:  None    ED Discharge Orders     None          Arloa Chroman, PA-C 08/18/24 0035  "

## 2024-08-17 NOTE — ED Triage Notes (Signed)
 PT arrived from home via [facility] s/p fall at 1900 in which pt tripped over his own feet, fell backwards on to concrete has a noted hematoma and laceration to the back of his head, bleeding controlled at present. Pt denies LOC. Pt denies dizziness. PT also c/o R sided flank pain, per EMS there is noted swelling to area of pain.

## 2024-08-17 NOTE — ED Provider Notes (Addendum)
 " [name] [facility] Provider Note   CSN: [id number] Arrival date & time: 08/17/24  2025     Patient presents with: Fall and Head Injury   Edward Mullen is a 69 y.o. male who presents emergency department with chief complaint of fall.  Patient reports that he lost his footing fell backward and hit his head on the edge of a step.he does not take blood thinners and denies LOC. UTD on his tetanus vaccine      Fall  Head Injury      Prior to Admission medications  Medication Sig Start Date End Date Taking? Authorizing Provider  acetaminophen  (TYLENOL ) 500 MG tablet Take 1,000 mg by mouth every 6 (six) hours as needed for mild pain (pain score 1-3) or moderate pain (pain score 4-6).    [provider]  albuterol  (VENTOLIN  HFA) 108 (90 Base) MCG/ACT inhaler INHALE 1 TO 2 PUFFS BY MOUTH EVERY 6 HOURS AS NEEDED FOR WHEEZING OR SHORTNESS OF BREATH 10/29/22   Elnora Ip, MD  amiodarone  (PACERONE ) 200 MG tablet Take 1 tablet (200 mg total) by mouth daily. 03/09/24   Colletta Manuelita Garre, PA-C  ASPIRIN  LOW DOSE 81 MG tablet TAKE 1 TABLET (81 MG TOTAL) BY MOUTH DAILY. SWALLOW WHOLE(AM) 01/15/24   Milford, Harlene HERO, FNP  carvedilol  (COREG ) 3.125 MG tablet Take 1 tablet (3.125 mg total) by mouth 2 (two) times daily. 01/29/22 07/07/25  Glena Harlene HERO, FNP  gabapentin  (NEURONTIN ) 600 MG tablet Take 600 mg by mouth 2 (two) times daily.    [provider]  HYDROcodone -acetaminophen  (NORCO/VICODIN) 5-325 MG tablet Take 1-2 tablets by mouth every 6 (six) hours as needed for severe pain (pain score 7-10). Patient not taking: Reported on 07/07/2024 06/18/24   Caleen Burgess BROCKS, MD  JARDIANCE  10 MG TABS tablet Take 10 mg by mouth daily. 04/03/23   [provider]  lidocaine  (LIDODERM ) 5 % Place 2 patches onto the skin daily. Remove & Discard patch within 12 hours or as directed by MD.  Apply to the left rib area 06/18/24   Amin, Ankit  C, MD  losartan  (COZAAR ) 25 MG tablet Take 25 mg by mouth daily.    [provider]  mexiletine (MEXITIL ) 150 MG capsule TAKE 1 CAPSULE BY MOUTH 2 (TWO) TIMES DAILY (AM+BEDTIME) 06/09/24   Rolan Ezra RAMAN, MD  nortriptyline  (PAMELOR ) 75 MG capsule Take 75 mg by mouth daily. 06/07/24   [provider]  polyethylene glycol powder (GLYCOLAX /MIRALAX ) 17 GM/SCOOP powder Take 17 g by mouth 2 (two) times daily as needed for moderate constipation or severe constipation. Dissolve 1 capful (17g) in 4-8 ounces of liquid and take by mouth daily. 06/18/24   Amin, Ankit C, MD  rosuvastatin  (CRESTOR ) 40 MG tablet Take 1 tablet (40 mg total) by mouth daily. 01/22/24   Rolan Ezra RAMAN, MD  senna (SENOKOT) 8.6 MG TABS tablet Take 2 tablets (17.2 mg total) by mouth at bedtime. Patient not taking: Reported on 07/07/2024 06/18/24   Caleen Burgess BROCKS, MD  spironolactone  (ALDACTONE ) 25 MG tablet Take 1 tablet (25 mg total) by mouth daily. 03/09/24   Colletta Manuelita Garre, PA-C  STIOLTO RESPIMAT 2.5-2.5 MCG/ACT AERS Inhale 2 puffs into the lungs daily. 06/11/23   [provider]  tamsulosin  (FLOMAX ) 0.4 MG CAPS capsule Take 0.4 mg by mouth at bedtime.    [provider]  torsemide  (DEMADEX ) 20 MG tablet TAKE 1 TABLET (20 MG TOTAL) BY MOUTH DAILY (  AM) 06/09/24   Rolan Ezra RAMAN, MD  zolpidem  (AMBIEN ) 10 MG tablet Take 1 tablet (10 mg total) by mouth at bedtime as needed for sleep. 09/16/22 01/03/23      Allergies: Patient has no known allergies.    Review of Systems  Updated Vital Signs BP 118/82   Temp 98.4 F (36.9 C) (Oral)   Resp (!) 22   SpO2 95%   Physical Exam Vitals and nursing note reviewed.  Constitutional:      General: He is not in acute distress.    Appearance: He is well-developed. He is not diaphoretic.  HENT:     Head: Normocephalic.     Comments: Hematoma and abrasion to the post occiput- no laceration Eyes:     General: No scleral icterus.     Conjunctiva/sclera: Conjunctivae normal.  Cardiovascular:     Rate and Rhythm: Normal rate and regular rhythm.     Heart sounds: Normal heart sounds.  Pulmonary:     Effort: Pulmonary effort is normal. No respiratory distress.     Breath sounds: Normal breath sounds.  Abdominal:     Palpations: Abdomen is soft.     Tenderness: There is no abdominal tenderness.  Musculoskeletal:     Cervical back: Normal range of motion and neck supple.     Comments: Midline spinal tenderness lower thoracic upper lumbar   Skin:    General: Skin is warm and dry.  Neurological:     General: No focal deficit present.     Mental Status: He is alert and oriented to person, place, and time.     Cranial Nerves: No cranial nerve deficit.     Sensory: No sensory deficit.     Motor: No weakness.     Coordination: Coordination normal.  Psychiatric:        Behavior: Behavior normal.     (all labs ordered are listed, but only abnormal results are displayed) Labs Reviewed - No data to display  EKG: None  Radiology: No results found.   Procedures   Medications Ordered in the ED - No data to display                                  Medical Decision Making Amount and/or Complexity of Data Reviewed Labs: ordered. Radiology: ordered.  Risk OTC drugs. Prescription drug management.    Patient with mechanical fall, scalp abrasion and acute on chronic back pain after fall downstairs.  Reviewed labs ordered in triage including CBC which shows a low platelet count 108 which appears to be above patient's baseline.  Patient CMP shows baseline serum creatinine of 2.09 consistent with history of renal insufficiency Upon initial evaluation patient was not complaining of back pain however on reevaluation after CT head and C-spine resolved patient now complaining of pain at the lower thoracic upper lumbar region.  Have ordered plain films of his bar and thoracic spine.  Handoff given to PA up still at  shift change.  Tylenol  ordered for pain control.       Final diagnoses:  None    ED Discharge Orders     None          Arloa Chroman, PA-C 08/18/24 0035    Arloa Chroman, PA-C 08/20/24 1136    Franklyn Sid SAILOR, MD 08/20/24 1538  "

## 2024-08-17 NOTE — ED Provider Notes (Incomplete)
" °  Physical Exam  BP 118/82   Temp 98.4 F (36.9 C) (Oral)   Resp (!) 22   Ht 6' (1.829 m)   Wt 97.5 kg   SpO2 95%   BMI 29.16 kg/m   Physical Exam  Procedures  Procedures  ED Course / MDM    Medical Decision Making Amount and/or Complexity of Data Reviewed Radiology: ordered.  Risk OTC drugs.   Fall - exam showing back pain Pending spinal plain films     "

## 2024-08-18 ENCOUNTER — Emergency Department (HOSPITAL_COMMUNITY)

## 2024-08-18 LAB — CBC WITH DIFFERENTIAL/PLATELET
Abs Immature Granulocytes: 0.03 K/uL (ref 0.00–0.07)
Basophils Absolute: 0 K/uL (ref 0.0–0.1)
Basophils Relative: 0 %
Eosinophils Absolute: 0.2 K/uL (ref 0.0–0.5)
Eosinophils Relative: 2 %
HCT: 47.6 % (ref 39.0–52.0)
Hemoglobin: 15.1 g/dL (ref 13.0–17.0)
Immature Granulocytes: 1 %
Lymphocytes Relative: 12 %
Lymphs Abs: 0.8 K/uL (ref 0.7–4.0)
MCH: 28.4 pg (ref 26.0–34.0)
MCHC: 31.7 g/dL (ref 30.0–36.0)
MCV: 89.6 fL (ref 80.0–100.0)
Monocytes Absolute: 1.5 K/uL — ABNORMAL HIGH (ref 0.1–1.0)
Monocytes Relative: 24 %
Neutro Abs: 3.8 K/uL (ref 1.7–7.7)
Neutrophils Relative %: 61 %
Platelets: 108 K/uL — ABNORMAL LOW (ref 150–400)
RBC: 5.31 MIL/uL (ref 4.22–5.81)
RDW: 14.8 % (ref 11.5–15.5)
WBC: 6.3 K/uL (ref 4.0–10.5)
nRBC: 0 % (ref 0.0–0.2)

## 2024-08-18 LAB — COMPREHENSIVE METABOLIC PANEL WITH GFR
ALT: 23 U/L (ref 0–44)
AST: 40 U/L (ref 15–41)
Albumin: 4.3 g/dL (ref 3.5–5.0)
Alkaline Phosphatase: 168 U/L — ABNORMAL HIGH (ref 38–126)
Anion gap: 13 (ref 5–15)
BUN: 26 mg/dL — ABNORMAL HIGH (ref 8–23)
CO2: 26 mmol/L (ref 22–32)
Calcium: 10 mg/dL (ref 8.9–10.3)
Chloride: 99 mmol/L (ref 98–111)
Creatinine, Ser: 2.09 mg/dL — ABNORMAL HIGH (ref 0.61–1.24)
GFR, Estimated: 33 mL/min — ABNORMAL LOW
Glucose, Bld: 91 mg/dL (ref 70–99)
Potassium: 4.5 mmol/L (ref 3.5–5.1)
Sodium: 137 mmol/L (ref 135–145)
Total Bilirubin: 0.5 mg/dL (ref 0.0–1.2)
Total Protein: 8.6 g/dL — ABNORMAL HIGH (ref 6.5–8.1)

## 2024-08-18 MED ORDER — HYDROCODONE-ACETAMINOPHEN 5-325 MG PO TABS: 1.0000 | ORAL_TABLET | Freq: Four times a day (QID) | ORAL | 0 refills | Status: AC | PRN

## 2024-08-18 MED ORDER — OXYCODONE HCL 5 MG PO TABS
5.0000 mg | ORAL_TABLET | Freq: Once | ORAL | Status: AC
  Administered 2024-08-18: 5 mg via ORAL
  Filled 2024-08-18: qty 1

## 2024-08-18 MED ORDER — FENTANYL CITRATE (PF) 50 MCG/ML IJ SOSY
50.0000 ug | PREFILLED_SYRINGE | Freq: Once | INTRAMUSCULAR | Status: AC
  Administered 2024-08-18: 50 ug via INTRAVENOUS
  Filled 2024-08-18: qty 1

## 2024-08-18 NOTE — Discharge Instructions (Addendum)
 You had a CT scan performed in the emergency department today that showed multiple findings that will need to be followed up closely with your family doctor.  These include emphysema, an aortic aneurysm, enlarged prostate and atherosclerosis.  You will also need to call your neurosurgeon so you can follow-up in the office regarding your back fracture.

## 2024-08-18 NOTE — ED Notes (Signed)
 Patient walked in hallway, patient 02 dropped to 87% while walking, and heart rate raised to 128. Patient was very tired after walking.

## 2024-08-18 NOTE — ED Provider Notes (Signed)
 Care assumed at 0500.  Patient's status post a fall, has an abrasion to the back of his head.  He also complains of right flank pain.  Care assumed pending CT chest and pelvis.  CT demonstrates acute oblique fracture through the anterior aspect of T9 vertebral body with mild distraction.  He also has multiple subacute rib fractures.  Incidental findings of aortic aneurysm, pulmonary emphysema, prostatic megaly and atherosclerosis.  On examination he does have back pain, he is able to fully range bilateral lower extremities and has 5 out of 5 strength in proximal and distal muscle groups.  He has sensation to light touch intact in bilateral lower extremities.  Patient would like to go home.  Discussed with Dr. Gillie with neurosurgery.  Plan to discharge home with outpatient neurosurgical follow-up.  Also discussed with patient multiple incidental findings on imaging that will require PCP follow-up for further evaluation.   Griselda Norris, MD 08/18/24 [phone]

## 2024-08-18 NOTE — Progress Notes (Signed)
 Patient ID: Edward Mullen, male   DOB: 04/16/1956, 69 y.o.   MRN: [id number] BP 112/83   Pulse 77   Temp 99.2 F (37.3 C)   Resp (!) 22   Ht 6' (1.829 m)   Wt 97.5 kg   SpO2 95%   BMI 29.16 kg/m  CT chest reviewed. Has a fracture through the anterior elements of T9. Posterior elements are intact. Ok to discharge, needs followup with us . There is no spinal canal encroachment, or compromise.

## 2024-08-23 ENCOUNTER — Other Ambulatory Visit: Payer: Self-pay

## 2024-08-23 ENCOUNTER — Encounter (HOSPITAL_COMMUNITY): Payer: Self-pay | Admitting: Emergency Medicine

## 2024-08-23 ENCOUNTER — Inpatient Hospital Stay (HOSPITAL_COMMUNITY)
Admission: EM | Admit: 2024-08-23 | Discharge: 2024-08-27 | DRG: 193 | Disposition: A | Discharge status: Home / Self Care | Attending: Internal Medicine | Admitting: Internal Medicine

## 2024-08-23 DIAGNOSIS — J189 Pneumonia, unspecified organism: Principal | ICD-10-CM | POA: Diagnosis present

## 2024-08-23 DIAGNOSIS — Z7982 Long term (current) use of aspirin: Secondary | ICD-10-CM

## 2024-08-23 DIAGNOSIS — J439 Emphysema, unspecified: Secondary | ICD-10-CM | POA: Diagnosis present

## 2024-08-23 DIAGNOSIS — W109XXA Fall (on) (from) unspecified stairs and steps, initial encounter: Secondary | ICD-10-CM | POA: Diagnosis present

## 2024-08-23 DIAGNOSIS — D696 Thrombocytopenia, unspecified: Secondary | ICD-10-CM | POA: Diagnosis present

## 2024-08-23 DIAGNOSIS — Z981 Arthrodesis status: Secondary | ICD-10-CM

## 2024-08-23 DIAGNOSIS — Z95810 Presence of automatic (implantable) cardiac defibrillator: Secondary | ICD-10-CM

## 2024-08-23 DIAGNOSIS — S2249XS Multiple fractures of ribs, unspecified side, sequela: Principal | ICD-10-CM

## 2024-08-23 DIAGNOSIS — Z7984 Long term (current) use of oral hypoglycemic drugs: Secondary | ICD-10-CM

## 2024-08-23 DIAGNOSIS — E1122 Type 2 diabetes mellitus with diabetic chronic kidney disease: Secondary | ICD-10-CM | POA: Diagnosis present

## 2024-08-23 DIAGNOSIS — N1832 Chronic kidney disease, stage 3b: Secondary | ICD-10-CM | POA: Diagnosis present

## 2024-08-23 DIAGNOSIS — E119 Type 2 diabetes mellitus without complications: Secondary | ICD-10-CM

## 2024-08-23 DIAGNOSIS — Z8781 Personal history of (healed) traumatic fracture: Secondary | ICD-10-CM

## 2024-08-23 DIAGNOSIS — J9811 Atelectasis: Secondary | ICD-10-CM | POA: Diagnosis present

## 2024-08-23 DIAGNOSIS — I714 Abdominal aortic aneurysm, without rupture, unspecified: Secondary | ICD-10-CM | POA: Diagnosis present

## 2024-08-23 DIAGNOSIS — Z8249 Family history of ischemic heart disease and other diseases of the circulatory system: Secondary | ICD-10-CM

## 2024-08-23 DIAGNOSIS — J440 Chronic obstructive pulmonary disease with acute lower respiratory infection: Secondary | ICD-10-CM | POA: Diagnosis present

## 2024-08-23 DIAGNOSIS — Z955 Presence of coronary angioplasty implant and graft: Secondary | ICD-10-CM

## 2024-08-23 DIAGNOSIS — I130 Hypertensive heart and chronic kidney disease with heart failure and stage 1 through stage 4 chronic kidney disease, or unspecified chronic kidney disease: Secondary | ICD-10-CM | POA: Diagnosis present

## 2024-08-23 DIAGNOSIS — R296 Repeated falls: Secondary | ICD-10-CM | POA: Diagnosis present

## 2024-08-23 DIAGNOSIS — F1910 Other psychoactive substance abuse, uncomplicated: Secondary | ICD-10-CM | POA: Diagnosis present

## 2024-08-23 DIAGNOSIS — J441 Chronic obstructive pulmonary disease with (acute) exacerbation: Secondary | ICD-10-CM | POA: Diagnosis present

## 2024-08-23 DIAGNOSIS — I5022 Chronic systolic (congestive) heart failure: Secondary | ICD-10-CM | POA: Diagnosis present

## 2024-08-23 DIAGNOSIS — E559 Vitamin D deficiency, unspecified: Secondary | ICD-10-CM | POA: Diagnosis present

## 2024-08-23 DIAGNOSIS — J9601 Acute respiratory failure with hypoxia: Secondary | ICD-10-CM | POA: Diagnosis present

## 2024-08-23 DIAGNOSIS — Z79899 Other long term (current) drug therapy: Secondary | ICD-10-CM

## 2024-08-23 DIAGNOSIS — K5900 Constipation, unspecified: Secondary | ICD-10-CM | POA: Diagnosis present

## 2024-08-23 DIAGNOSIS — I428 Other cardiomyopathies: Secondary | ICD-10-CM | POA: Diagnosis present

## 2024-08-23 DIAGNOSIS — Z823 Family history of stroke: Secondary | ICD-10-CM

## 2024-08-23 DIAGNOSIS — F1210 Cannabis abuse, uncomplicated: Secondary | ICD-10-CM | POA: Diagnosis present

## 2024-08-23 DIAGNOSIS — S2242XA Multiple fractures of ribs, left side, initial encounter for closed fracture: Secondary | ICD-10-CM | POA: Diagnosis present

## 2024-08-23 DIAGNOSIS — S22079A Unspecified fracture of T9-T10 vertebra, initial encounter for closed fracture: Secondary | ICD-10-CM | POA: Diagnosis present

## 2024-08-23 DIAGNOSIS — Z8679 Personal history of other diseases of the circulatory system: Secondary | ICD-10-CM

## 2024-08-23 DIAGNOSIS — N400 Enlarged prostate without lower urinary tract symptoms: Secondary | ICD-10-CM | POA: Diagnosis present

## 2024-08-23 DIAGNOSIS — N183 Chronic kidney disease, stage 3 unspecified: Secondary | ICD-10-CM | POA: Diagnosis present

## 2024-08-23 DIAGNOSIS — I2510 Atherosclerotic heart disease of native coronary artery without angina pectoris: Secondary | ICD-10-CM | POA: Diagnosis present

## 2024-08-23 DIAGNOSIS — F17210 Nicotine dependence, cigarettes, uncomplicated: Secondary | ICD-10-CM | POA: Diagnosis present

## 2024-08-23 MED ORDER — HYDROCODONE-ACETAMINOPHEN 5-325 MG PO TABS
1.0000 | ORAL_TABLET | Freq: Once | ORAL | Status: AC
  Administered 2024-08-23: 1 via ORAL
  Filled 2024-08-23: qty 1

## 2024-08-23 NOTE — ED Triage Notes (Signed)
 Pt bib ems with c/o back pain, seen last week after a fall and was dx with a t9 fx. States that he ran out of his hydrocodone  yesterday, took tylenol  without relief today.   134/90  72HR 92RA 20RR

## 2024-08-23 NOTE — ED Provider Triage Note (Signed)
 Emergency Medicine Provider Triage Evaluation Note  Edward Mullen , a 68 y.o. male  was evaluated in triage.  Pt complains of continued back pain after a fall down the steps. He was seen in the ED on 1/6 and had trauma imaging showing subacute rib fractures but acute T9 vertebral body fracture. He was discharged home with pain medication but is out. Denies TLSO brace. Has follow up with PCP in the AM and Neurosurgery next week. No new falls, numbness, or weakness.   Review of Systems  Positive: Back pain Negative: Numbness/weakness.   Physical Exam  BP 112/83   Pulse 81   Temp (!) 97.5 F (36.4 C)   Resp 18   SpO2 95%  Gen:   Awake, no distress   Resp:  Normal effort  MSK:   Moves extremities without difficulty  Other:  Normal strength and sensation in the bilateral LEs.   Medical Decision Making  Medically screening exam initiated at 8:02 PM.  Appropriate orders placed.  Edward Mullen was informed that the remainder of the evaluation will be completed by another provider, this initial triage assessment does not replace that evaluation, and the importance of remaining in the ED until their evaluation is complete.    Darra Fonda MATSU, MD 08/23/24 2004

## 2024-08-24 ENCOUNTER — Emergency Department (HOSPITAL_COMMUNITY)

## 2024-08-24 DIAGNOSIS — N1832 Chronic kidney disease, stage 3b: Secondary | ICD-10-CM

## 2024-08-24 DIAGNOSIS — J439 Emphysema, unspecified: Secondary | ICD-10-CM | POA: Diagnosis present

## 2024-08-24 DIAGNOSIS — W109XXA Fall (on) (from) unspecified stairs and steps, initial encounter: Secondary | ICD-10-CM | POA: Diagnosis present

## 2024-08-24 DIAGNOSIS — I5022 Chronic systolic (congestive) heart failure: Secondary | ICD-10-CM | POA: Diagnosis present

## 2024-08-24 DIAGNOSIS — K5900 Constipation, unspecified: Secondary | ICD-10-CM | POA: Diagnosis not present

## 2024-08-24 DIAGNOSIS — J9601 Acute respiratory failure with hypoxia: Secondary | ICD-10-CM

## 2024-08-24 DIAGNOSIS — E119 Type 2 diabetes mellitus without complications: Secondary | ICD-10-CM | POA: Diagnosis not present

## 2024-08-24 DIAGNOSIS — J189 Pneumonia, unspecified organism: Secondary | ICD-10-CM | POA: Diagnosis present

## 2024-08-24 DIAGNOSIS — J441 Chronic obstructive pulmonary disease with (acute) exacerbation: Secondary | ICD-10-CM

## 2024-08-24 DIAGNOSIS — Z8781 Personal history of (healed) traumatic fracture: Secondary | ICD-10-CM | POA: Diagnosis not present

## 2024-08-24 DIAGNOSIS — Z7982 Long term (current) use of aspirin: Secondary | ICD-10-CM | POA: Diagnosis not present

## 2024-08-24 DIAGNOSIS — E1122 Type 2 diabetes mellitus with diabetic chronic kidney disease: Secondary | ICD-10-CM | POA: Diagnosis present

## 2024-08-24 DIAGNOSIS — F1910 Other psychoactive substance abuse, uncomplicated: Secondary | ICD-10-CM

## 2024-08-24 DIAGNOSIS — D696 Thrombocytopenia, unspecified: Secondary | ICD-10-CM

## 2024-08-24 DIAGNOSIS — F17210 Nicotine dependence, cigarettes, uncomplicated: Secondary | ICD-10-CM | POA: Diagnosis present

## 2024-08-24 DIAGNOSIS — S2242XA Multiple fractures of ribs, left side, initial encounter for closed fracture: Secondary | ICD-10-CM | POA: Diagnosis present

## 2024-08-24 DIAGNOSIS — S22079A Unspecified fracture of T9-T10 vertebra, initial encounter for closed fracture: Secondary | ICD-10-CM | POA: Diagnosis not present

## 2024-08-24 DIAGNOSIS — Z8249 Family history of ischemic heart disease and other diseases of the circulatory system: Secondary | ICD-10-CM | POA: Diagnosis not present

## 2024-08-24 DIAGNOSIS — J440 Chronic obstructive pulmonary disease with acute lower respiratory infection: Secondary | ICD-10-CM | POA: Diagnosis present

## 2024-08-24 DIAGNOSIS — J9811 Atelectasis: Secondary | ICD-10-CM | POA: Diagnosis present

## 2024-08-24 DIAGNOSIS — Z95810 Presence of automatic (implantable) cardiac defibrillator: Secondary | ICD-10-CM | POA: Diagnosis not present

## 2024-08-24 DIAGNOSIS — R296 Repeated falls: Secondary | ICD-10-CM | POA: Diagnosis present

## 2024-08-24 DIAGNOSIS — S2249XS Multiple fractures of ribs, unspecified side, sequela: Secondary | ICD-10-CM | POA: Diagnosis present

## 2024-08-24 DIAGNOSIS — Z7984 Long term (current) use of oral hypoglycemic drugs: Secondary | ICD-10-CM | POA: Diagnosis not present

## 2024-08-24 DIAGNOSIS — I714 Abdominal aortic aneurysm, without rupture, unspecified: Secondary | ICD-10-CM | POA: Diagnosis present

## 2024-08-24 DIAGNOSIS — Z8679 Personal history of other diseases of the circulatory system: Secondary | ICD-10-CM | POA: Diagnosis not present

## 2024-08-24 DIAGNOSIS — Z79899 Other long term (current) drug therapy: Secondary | ICD-10-CM | POA: Diagnosis not present

## 2024-08-24 DIAGNOSIS — I428 Other cardiomyopathies: Secondary | ICD-10-CM | POA: Diagnosis present

## 2024-08-24 DIAGNOSIS — I130 Hypertensive heart and chronic kidney disease with heart failure and stage 1 through stage 4 chronic kidney disease, or unspecified chronic kidney disease: Secondary | ICD-10-CM | POA: Diagnosis present

## 2024-08-24 DIAGNOSIS — N400 Enlarged prostate without lower urinary tract symptoms: Secondary | ICD-10-CM | POA: Diagnosis present

## 2024-08-24 DIAGNOSIS — F1210 Cannabis abuse, uncomplicated: Secondary | ICD-10-CM | POA: Diagnosis present

## 2024-08-24 DIAGNOSIS — I2510 Atherosclerotic heart disease of native coronary artery without angina pectoris: Secondary | ICD-10-CM | POA: Diagnosis present

## 2024-08-24 LAB — BASIC METABOLIC PANEL WITH GFR
Anion gap: 12 (ref 5–15)
BUN: 28 mg/dL — ABNORMAL HIGH (ref 8–23)
CO2: 23 mmol/L (ref 22–32)
Calcium: 9.5 mg/dL (ref 8.9–10.3)
Chloride: 102 mmol/L (ref 98–111)
Creatinine, Ser: 1.75 mg/dL — ABNORMAL HIGH (ref 0.61–1.24)
GFR, Estimated: 41 mL/min — ABNORMAL LOW
Glucose, Bld: 103 mg/dL — ABNORMAL HIGH (ref 70–99)
Potassium: 4.8 mmol/L (ref 3.5–5.1)
Sodium: 136 mmol/L (ref 135–145)

## 2024-08-24 LAB — CBC
HCT: 47.5 % (ref 39.0–52.0)
Hemoglobin: 15.2 g/dL (ref 13.0–17.0)
MCH: 28.7 pg (ref 26.0–34.0)
MCHC: 32 g/dL (ref 30.0–36.0)
MCV: 89.8 fL (ref 80.0–100.0)
Platelets: 109 K/uL — ABNORMAL LOW (ref 150–400)
RBC: 5.29 MIL/uL (ref 4.22–5.81)
RDW: 14.6 % (ref 11.5–15.5)
WBC: 5.2 K/uL (ref 4.0–10.5)
nRBC: 0 % (ref 0.0–0.2)

## 2024-08-24 LAB — HEPATIC FUNCTION PANEL
ALT: 15 U/L (ref 0–44)
AST: 38 U/L (ref 15–41)
Albumin: 3.9 g/dL (ref 3.5–5.0)
Alkaline Phosphatase: 143 U/L — ABNORMAL HIGH (ref 38–126)
Bilirubin, Direct: 0.3 mg/dL — ABNORMAL HIGH (ref 0.0–0.2)
Indirect Bilirubin: 0.4 mg/dL (ref 0.3–0.9)
Total Bilirubin: 0.7 mg/dL (ref 0.0–1.2)
Total Protein: 8.1 g/dL (ref 6.5–8.1)

## 2024-08-24 LAB — PROCALCITONIN: Procalcitonin: 0.11 ng/mL

## 2024-08-24 MED ORDER — OXYCODONE-ACETAMINOPHEN 5-325 MG PO TABS
1.0000 | ORAL_TABLET | Freq: Four times a day (QID) | ORAL | Status: DC | PRN
  Administered 2024-08-24 – 2024-08-27 (×6): 1 via ORAL
  Filled 2024-08-24 (×7): qty 1

## 2024-08-24 MED ORDER — ACETAMINOPHEN 325 MG PO TABS: 650.0000 mg | ORAL_TABLET | Freq: Four times a day (QID) | ORAL | Status: DC | PRN

## 2024-08-24 MED ORDER — ENOXAPARIN SODIUM 40 MG/0.4ML IJ SOSY
40.0000 mg | PREFILLED_SYRINGE | INTRAMUSCULAR | Status: DC
  Administered 2024-08-24 – 2024-08-27 (×4): 40 mg via SUBCUTANEOUS
  Filled 2024-08-24 (×4): qty 0.4

## 2024-08-24 MED ORDER — SODIUM CHLORIDE 0.9 % IV SOLN
2.0000 g | Freq: Once | INTRAVENOUS | Status: AC
  Administered 2024-08-24: 2 g via INTRAVENOUS
  Filled 2024-08-24: qty 12.5

## 2024-08-24 MED ORDER — SPIRONOLACTONE 25 MG PO TABS
25.0000 mg | ORAL_TABLET | Freq: Every day | ORAL | Status: DC
  Administered 2024-08-24 – 2024-08-25 (×2): 25 mg via ORAL
  Filled 2024-08-24 (×2): qty 1

## 2024-08-24 MED ORDER — TORSEMIDE 20 MG PO TABS
20.0000 mg | ORAL_TABLET | Freq: Every day | ORAL | Status: DC
  Administered 2024-08-24 – 2024-08-25 (×2): 20 mg via ORAL
  Filled 2024-08-24 (×3): qty 1

## 2024-08-24 MED ORDER — HYDROCODONE-ACETAMINOPHEN 5-325 MG PO TABS
1.0000 | ORAL_TABLET | ORAL | Status: DC | PRN
  Administered 2024-08-24: 1 via ORAL
  Filled 2024-08-24: qty 1

## 2024-08-24 MED ORDER — IPRATROPIUM-ALBUTEROL 0.5-2.5 (3) MG/3ML IN SOLN: 3.0000 mL | RESPIRATORY_TRACT | Status: DC | PRN

## 2024-08-24 MED ORDER — MEXILETINE HCL 150 MG PO CAPS
150.0000 mg | ORAL_CAPSULE | Freq: Two times a day (BID) | ORAL | Status: DC
  Administered 2024-08-24 – 2024-08-27 (×6): 150 mg via ORAL
  Filled 2024-08-24 (×8): qty 1

## 2024-08-24 MED ORDER — KETOROLAC TROMETHAMINE 30 MG/ML IJ SOLN
15.0000 mg | Freq: Once | INTRAMUSCULAR | Status: AC
  Administered 2024-08-24: 15 mg via INTRAVENOUS
  Filled 2024-08-24: qty 1

## 2024-08-24 MED ORDER — IPRATROPIUM BROMIDE 0.02 % IN SOLN
0.5000 mg | Freq: Once | RESPIRATORY_TRACT | Status: AC
  Administered 2024-08-24: 0.5 mg via RESPIRATORY_TRACT
  Filled 2024-08-24: qty 2.5

## 2024-08-24 MED ORDER — GABAPENTIN 300 MG PO CAPS
600.0000 mg | ORAL_CAPSULE | Freq: Three times a day (TID) | ORAL | Status: DC
  Administered 2024-08-24 – 2024-08-27 (×8): 600 mg via ORAL
  Filled 2024-08-24 (×8): qty 2

## 2024-08-24 MED ORDER — PREDNISONE 20 MG PO TABS
40.0000 mg | ORAL_TABLET | Freq: Every day | ORAL | Status: DC
  Administered 2024-08-25 – 2024-08-27 (×3): 40 mg via ORAL
  Filled 2024-08-24 (×3): qty 2

## 2024-08-24 MED ORDER — EMPAGLIFLOZIN 10 MG PO TABS
10.0000 mg | ORAL_TABLET | Freq: Every day | ORAL | Status: DC
  Administered 2024-08-24 – 2024-08-27 (×4): 10 mg via ORAL
  Filled 2024-08-24 (×5): qty 1

## 2024-08-24 MED ORDER — SODIUM CHLORIDE 0.9 % IV SOLN: 2.0000 g | Freq: Three times a day (TID) | INTRAVENOUS | Status: DC

## 2024-08-24 MED ORDER — TAMSULOSIN HCL 0.4 MG PO CAPS
0.4000 mg | ORAL_CAPSULE | Freq: Every day | ORAL | Status: DC
  Administered 2024-08-24 – 2024-08-26 (×3): 0.4 mg via ORAL
  Filled 2024-08-24 (×3): qty 1

## 2024-08-24 MED ORDER — MORPHINE SULFATE (PF) 2 MG/ML IV SOLN
2.0000 mg | INTRAVENOUS | Status: DC | PRN
  Administered 2024-08-25: 2 mg via INTRAVENOUS
  Filled 2024-08-24: qty 1

## 2024-08-24 MED ORDER — HYDROCODONE-ACETAMINOPHEN 5-325 MG PO TABS: 1.0000 | ORAL_TABLET | Freq: Four times a day (QID) | ORAL | Status: DC | PRN

## 2024-08-24 MED ORDER — METHYLPREDNISOLONE SODIUM SUCC 125 MG IJ SOLR: 125.0000 mg | INTRAMUSCULAR | Status: DC

## 2024-08-24 MED ORDER — AMIODARONE HCL 200 MG PO TABS
200.0000 mg | ORAL_TABLET | Freq: Every day | ORAL | Status: DC
  Administered 2024-08-24 – 2024-08-27 (×4): 200 mg via ORAL
  Filled 2024-08-24 (×4): qty 1

## 2024-08-24 MED ORDER — UMECLIDINIUM BROMIDE 62.5 MCG/ACT IN AEPB
1.0000 | INHALATION_SPRAY | Freq: Every day | RESPIRATORY_TRACT | Status: DC
  Administered 2024-08-26 – 2024-08-27 (×2): 1 via RESPIRATORY_TRACT
  Filled 2024-08-24: qty 7

## 2024-08-24 MED ORDER — NORTRIPTYLINE HCL 25 MG PO CAPS
75.0000 mg | ORAL_CAPSULE | Freq: Every day | ORAL | Status: DC
  Administered 2024-08-24 – 2024-08-26 (×3): 75 mg via ORAL
  Filled 2024-08-24 (×5): qty 3

## 2024-08-24 MED ORDER — SENNOSIDES-DOCUSATE SODIUM 8.6-50 MG PO TABS
1.0000 | ORAL_TABLET | Freq: Two times a day (BID) | ORAL | Status: DC
  Administered 2024-08-24 – 2024-08-27 (×6): 1 via ORAL
  Filled 2024-08-24 (×6): qty 1

## 2024-08-24 MED ORDER — ASPIRIN 81 MG PO TBEC
81.0000 mg | DELAYED_RELEASE_TABLET | Freq: Every day | ORAL | Status: DC
  Administered 2024-08-24 – 2024-08-27 (×4): 81 mg via ORAL
  Filled 2024-08-24 (×4): qty 1

## 2024-08-24 MED ORDER — GUAIFENESIN ER 600 MG PO TB12
600.0000 mg | ORAL_TABLET | Freq: Two times a day (BID) | ORAL | Status: DC
  Administered 2024-08-24 – 2024-08-27 (×7): 600 mg via ORAL
  Filled 2024-08-24 (×7): qty 1

## 2024-08-24 MED ORDER — ALBUTEROL SULFATE (2.5 MG/3ML) 0.083% IN NEBU
5.0000 mg | INHALATION_SOLUTION | Freq: Once | RESPIRATORY_TRACT | Status: AC
  Administered 2024-08-24: 5 mg via RESPIRATORY_TRACT
  Filled 2024-08-24: qty 6

## 2024-08-24 MED ORDER — VANCOMYCIN HCL IN DEXTROSE 1-5 GM/200ML-% IV SOLN: 1000.0000 mg | Freq: Once | INTRAVENOUS | Status: DC

## 2024-08-24 MED ORDER — IOHEXOL 350 MG/ML SOLN
75.0000 mL | Freq: Once | INTRAVENOUS | Status: AC | PRN
  Administered 2024-08-24: 75 mL via INTRAVENOUS

## 2024-08-24 MED ORDER — ARFORMOTEROL TARTRATE 15 MCG/2ML IN NEBU
15.0000 ug | INHALATION_SOLUTION | Freq: Two times a day (BID) | RESPIRATORY_TRACT | Status: DC
  Administered 2024-08-24 – 2024-08-27 (×6): 15 ug via RESPIRATORY_TRACT
  Filled 2024-08-24 (×6): qty 2

## 2024-08-24 MED ORDER — ROSUVASTATIN CALCIUM 20 MG PO TABS
40.0000 mg | ORAL_TABLET | Freq: Every day | ORAL | Status: DC
  Administered 2024-08-24 – 2024-08-26 (×3): 40 mg via ORAL
  Filled 2024-08-24 (×3): qty 2

## 2024-08-24 MED ORDER — ACETAMINOPHEN 650 MG RE SUPP: 650.0000 mg | Freq: Four times a day (QID) | RECTAL | Status: DC | PRN

## 2024-08-24 MED ORDER — VANCOMYCIN HCL 1500 MG/300ML IV SOLN
1500.0000 mg | Freq: Once | INTRAVENOUS | Status: AC
  Administered 2024-08-24: 1500 mg via INTRAVENOUS
  Filled 2024-08-24 (×2): qty 300

## 2024-08-24 MED ORDER — SODIUM CHLORIDE 0.9 % IV SOLN
2.0000 g | INTRAVENOUS | Status: DC
  Administered 2024-08-24 – 2024-08-27 (×4): 2 g via INTRAVENOUS
  Filled 2024-08-24 (×4): qty 20

## 2024-08-24 MED ORDER — CARVEDILOL 3.125 MG PO TABS
3.1250 mg | ORAL_TABLET | Freq: Two times a day (BID) | ORAL | Status: DC
  Administered 2024-08-24 – 2024-08-27 (×6): 3.125 mg via ORAL
  Filled 2024-08-24 (×7): qty 1

## 2024-08-24 NOTE — Progress Notes (Signed)
 Rt instructed patient on the use of a flutter valve. Patient was able to demonstrate back good technique followed by a congested cough.

## 2024-08-24 NOTE — Progress Notes (Signed)
 Orthopedic Tech Progress Note Patient Details:  Edward Mullen 12/02/1955 [id number] Applied and fitted TLSO to patient and removed. Well tolerated. Ortho Devices Type of Ortho Device: Thoracolumbar corset (TLSO) Ortho Device/Splint Location: TLSO Ortho Device/Splint Interventions: Ordered, Application, Adjustment, Removal   Post Interventions Patient Tolerated: Well Instructions Provided: Adjustment of device  [name] JAYSON December 08/24/2024, 9:19 AM

## 2024-08-24 NOTE — ED Notes (Addendum)
 [employer] called for TLSO brace application.  Dietary contacted for late breakfast tray. Pt provided with water per request.

## 2024-08-24 NOTE — H&P (Signed)
 " History and Physical    Patient: Edward Mullen FMW:[id number] DOB: [birth year] DOA: 08/23/2024 DOS: the patient was seen and examined on 08/24/2024 PCP: Leontine Cramp, NP  Patient coming from: Home  Chief Complaint:  Chief Complaint  Patient presents with   Back Pain   HPI: Edward Mullen is a 69 y.o. male with medical history significant of systolic heart failure last EF noted to be around 35%, V. tach with cardiac arrest s/p ICD, CAD  s/p PCI in 2023, polysubstance abuse presents with back pain and cough following a fall.  The patient experienced a fall last week while carrying groceries, resulting in a cracked bone in his spine. The fall occurred when his legs gave out, causing him to fall backwards and hit his head on the sidewalk and his back on the steps. Since the fall, he has been experiencing back pain.  Since Sunday, he has had severe coughing, which causes burning pain in the right side of his abdomen and back. The pain worsens with coughing. He has been producing a lot of white phlegm, with some yellow phlegm as well. No fevers or chills have been noted. He denies any leg swelling or difficulty swallowing and reports no issues with choking while eating.  He has a history of smoking marijuana and previously used cocaine , which he stopped over six months ago. He quit smoking cigarettes some time ago and does not consume alcohol .  He mentions difficulty with bowel movements, feeling the need to use the bathroom but only passing gas.  In the emergency department patient was noted to be afebrile with pulse 59-81, respirations 16-28, blood pressures maintaining, and O2 saturations as low as 68% on room air with improvement on 3 L nasal cannula oxygen.  CT angiogram of the chest abdomen pelvis was obtained which revealed no signs of a PE, new patchy bronchovascular opacities in the posterior right upper lobe, ground glass opacities in the lower lobes bilaterally, aneurysmal dilation  of the ascending thoracic aorta measuring approximately 4.3 cm, subacute fractures of T9 vertebrae, pulmonary emphysema, healing subacute fractures of the left posterior 9th and 10th rib, status post interbody fusion with bilateral posterior lateral spinal fixation the lumbar spine, extensive artheromatous calcifications involving the aortoiliac arteries, and moderate prostatomegaly.  Patient had been given ketorolac  15 mg IV, hydrocodone  5 mg, DuoNeb, vancomycin , cefepime .  Review of Systems: As mentioned in the history of present illness. All other systems reviewed and are negative. Past Medical History:  Diagnosis Date   Arthritis    feels like it in my legs (09/20/2014)   CAD (coronary artery disease)    Chronic kidney disease, stage 3a (HCC)    Chronic systolic CHF (congestive heart failure) (HCC)    Cocaine  abuse (HCC)    Diabetes mellitus without complication (HCC) 10/05/2021   type 2   Heart murmur    Hypertension    NICM (nonischemic cardiomyopathy) (HCC)    NSVT (nonsustained ventricular tachycardia) (HCC) 09/06/2016   pre diabetes    patient denies   Past Surgical History:  Procedure Laterality Date   BIV ICD INSERTION CRT-D N/A 07/17/2022   Procedure: BIV ICD INSERTION CRT-D;  Surgeon: Fernande Elspeth BROCKS, MD;  Location: [facility] INVASIVE CV LAB;  Service: Cardiovascular;  Laterality: N/A;   CARDIAC CATHETERIZATION N/A 08/27/2016   Procedure: Right/Left Heart Cath and Coronary Angiography;  Surgeon: Ezra GORMAN Shuck, MD;  Location: [facility] INVASIVE CV LAB;  Service: Cardiovascular;  Laterality: N/A;   COLONOSCOPY  CYSTECTOMY Right    back of my shoulder   IR RADIOLOGIST EVAL & MGMT  08/27/2023   LEAD EXTRACTION N/A 12/22/2023   Procedure: LEAD EXTRACTION;  Surgeon: Cindie Ole DASEN, MD;  Location: [facility] INVASIVE CV LAB;  Service: Cardiovascular;  Laterality: N/A;   LEAD INSERTION N/A 12/22/2023   Procedure: LEAD INSERTION;  Surgeon: Cindie Ole DASEN, MD;  Location: [facility] INVASIVE CV LAB;   Service: Cardiovascular;  Laterality: N/A;   RIGHT/LEFT HEART CATH AND CORONARY ANGIOGRAPHY N/A 12/04/2020   Procedure: RIGHT/LEFT HEART CATH AND CORONARY ANGIOGRAPHY;  Surgeon: Rolan Ezra RAMAN, MD;  Location: [facility] INVASIVE CV LAB;  Service: Cardiovascular;  Laterality: N/A;   RIGHT/LEFT HEART CATH AND CORONARY ANGIOGRAPHY N/A 10/10/2021   Procedure: RIGHT/LEFT HEART CATH AND CORONARY ANGIOGRAPHY;  Surgeon: Rolan Ezra RAMAN, MD;  Location: [facility] INVASIVE CV LAB;  Service: Cardiovascular;  Laterality: N/A;   TONSILLECTOMY     TRANSESOPHAGEAL ECHOCARDIOGRAM (CATH LAB) N/A 12/22/2023   Procedure: TRANSESOPHAGEAL ECHOCARDIOGRAM;  Surgeon: Cindie Ole DASEN, MD;  Location: [facility] INVASIVE CV LAB;  Service: Cardiovascular;  Laterality: N/A;   Social History:  reports that he has been smoking cigarettes. He has a 4.8 pack-year smoking history. He has never used smokeless tobacco. He reports current alcohol  use of about 1.0 standard drink of alcohol  per week. He reports current drug use. Drug: Marijuana.  Allergies[1]  Family History  Problem Relation Age of Onset   Stroke Mother 65   Heart disease Father 70   Colon cancer Neg Hx    Colon polyps Neg Hx    Esophageal cancer Neg Hx    Stomach cancer Neg Hx    Rectal cancer Neg Hx     Prior to Admission medications  Medication Sig Start Date End Date Taking? Authorizing Provider  acetaminophen  (TYLENOL ) 500 MG tablet Take 1,000 mg by mouth every 6 (six) hours as needed for mild pain (pain score 1-3) or moderate pain (pain score 4-6).    [provider]  albuterol  (VENTOLIN  HFA) 108 (90 Base) MCG/ACT inhaler INHALE 1 TO 2 PUFFS BY MOUTH EVERY 6 HOURS AS NEEDED FOR WHEEZING OR SHORTNESS OF BREATH 10/29/22   Elnora Ip, MD  amiodarone  (PACERONE ) 200 MG tablet Take 1 tablet (200 mg total) by mouth daily. 03/09/24   Colletta Manuelita Garre, PA-C  ASPIRIN  LOW DOSE 81 MG tablet TAKE 1 TABLET (81 MG TOTAL) BY MOUTH DAILY. SWALLOW WHOLE(AM) 01/15/24    [place], Harlene HERO, FNP  carvedilol  (COREG ) 3.125 MG tablet Take 1 tablet (3.125 mg total) by mouth 2 (two) times daily. 01/29/22 07/07/25  Glena Harlene HERO, FNP  gabapentin  (NEURONTIN ) 600 MG tablet Take 600 mg by mouth 2 (two) times daily.    [provider]  HYDROcodone -acetaminophen  (NORCO/VICODIN) 5-325 MG tablet Take 1 tablet by mouth every 6 (six) hours as needed. 08/18/24   Griselda Norris, MD  JARDIANCE  10 MG TABS tablet Take 10 mg by mouth daily. 04/03/23   [provider]  lidocaine  (LIDODERM ) 5 % Place 2 patches onto the skin daily. Remove & Discard patch within 12 hours or as directed by MD.  Apply to the left rib area 06/18/24   Amin, Ankit C, MD  losartan  (COZAAR ) 25 MG tablet Take 25 mg by mouth daily.    [provider]  mexiletine (MEXITIL ) 150 MG capsule TAKE 1 CAPSULE BY MOUTH 2 (TWO) TIMES DAILY (AM+BEDTIME) 06/09/24   Rolan Ezra RAMAN, MD  nortriptyline  (PAMELOR ) 75 MG capsule Take 75 mg by mouth  daily. 06/07/24   [provider]  polyethylene glycol powder (GLYCOLAX /MIRALAX ) 17 GM/SCOOP powder Take 17 g by mouth 2 (two) times daily as needed for moderate constipation or severe constipation. Dissolve 1 capful (17g) in 4-8 ounces of liquid and take by mouth daily. 06/18/24   Amin, Ankit C, MD  rosuvastatin  (CRESTOR ) 40 MG tablet Take 1 tablet (40 mg total) by mouth daily. 01/22/24   Rolan Ezra RAMAN, MD  senna (SENOKOT) 8.6 MG TABS tablet Take 2 tablets (17.2 mg total) by mouth at bedtime. Patient not taking: Reported on 07/07/2024 06/18/24   Caleen Burgess BROCKS, MD  spironolactone  (ALDACTONE ) 25 MG tablet Take 1 tablet (25 mg total) by mouth daily. 03/09/24   Colletta Manuelita Garre, PA-C  STIOLTO RESPIMAT 2.5-2.5 MCG/ACT AERS Inhale 2 puffs into the lungs daily. 06/11/23   [provider]  tamsulosin  (FLOMAX ) 0.4 MG CAPS capsule Take 0.4 mg by mouth at bedtime.    [provider]  torsemide  (DEMADEX ) 20 MG tablet TAKE 1 TABLET (20 MG  TOTAL) BY MOUTH DAILY (AM) 06/09/24   Rolan Ezra RAMAN, MD  zolpidem  (AMBIEN ) 10 MG tablet Take 1 tablet (10 mg total) by mouth at bedtime as needed for sleep. 09/16/22 01/03/23      Physical Exam: Vitals:   08/24/24 0536 08/24/24 0615 08/24/24 0630 08/24/24 0839  BP:  118/72 133/83 109/86  Pulse:  70 70 72  Resp:  16 (!) 21 15  Temp: 97.7 F (36.5 C)   97.9 F (36.6 C)  TempSrc: Oral   Oral  SpO2:  96% 96% 100%   Constitutional: Elderly male who appears to be in some distress  Eyes: PERRL, lids and conjunctivae normal ENMT: Mucous membranes are moist. Posterior pharynx clear of any exudate or lesions.Normal dentition.  Neck: normal, supple,   Respiratory: Decreased aeration with some expiratory wheezes noted on physical exam.  O2 saturation currently maintaining back nasal cannula oxygen. Cardiovascular: Regular rate and rhythm, no murmurs / rubs / gallops.  Abdomen: no tenderness, no masses palpated.  Bowel sounds positive.  Musculoskeletal: no clubbing / cyanosis.  Tenderness to palpation of the left side of the chest. Skin: no rashes, lesions, ulcers. No induration Neurologic: CN 2-12 grossly intact.  Strength 5/5 in all 4.  Psychiatric: Normal judgment and insight. Alert and oriented x 3. Normal mood.   Data Reviewed:  zReviewed labs, imaging, and pertinent records as documented.  Assessment and Plan:  Acute respiratory failure with hypoxia secondary to community-acquired pneumonia Patient noted to have a productive cough with O2 saturations noted to be as low as 68% on room air with improvement on 3 L nasal cannula oxygen.  No fever documented.  New patchy Bronchlear opacities in the right upper lobe and ground glass glass opacities on lower lobe on CT.   Patient had been started on empiric antibiotics of cefepime  and vancomycin .  Suspect likely multifactorial in the setting of back and rib fractures and possible COPD exacerbation. - Admit to a telemetry bed - Nasal cannula  oxygen as needed to maintain O2 saturation greater than 92% - Incentive spirometry and flutter valve - Check procalcitonin and MRSA screen - Adjust antibiotics to Rocephin   - Mucinex   COPD exacerbation Patient noted to have some mild wheezes on physical exam. - Continue scheduled breathing treatments and as needed - Prednisone   Constipation Patient reports complaints of constipation. - Senokot-S  History of left posterior rib fractures T9 fracture Prior to arrival.  Patient had fallen back in  November and noted to have left-sided rib fractures.  Patient was seen in the ED after having a fall on 08/17/2024 although imaging studies at that time did not note any signs of a fracture.  CT does reveal a T9 fracture. - Continue TLSO brace - Pain management with hydrocodone  - PT to eval and treat   Systolic congestive heart failure Chronic.  On physical exam patient without significant signs of being fluid overloaded at this time.  Last echocardiogram noted EF to be 30 to 35%. - Strict I&O's and daily weights - Continue Coreg , losartan , torsemide , and spironolactone   History of VT S/p AICD - Continue  amiodarone  200 mg daily and mexiletine 150 mg BID  Controlled Diabetes mellitus type 2, without long-term use of insulin  Last hemoglobin A1c noted to be 5.6 when checked on 06/16/2024. - Continue Jardiance   Chronic kidney disease stage IIIb Creatinine noted to be 1.75 with BUN 28.  Creatinine appears similar to prior. - Continue to monitor kidney function during hospitalization  Thrombocytopenia Chronic.  Platelet count noted to be 109 which appears similar to priors. - Continue to monitor  AAA CT scan revealed dilation of the ascending thoracic aorta measuring approximately 4.3 cm - Recommend outpatient surveillance  Polysubstance abuse Patient reports continued use of marijuana, but reports not recently using cocaine  in the last month. - continue to counsel need of cessation of  substance use   DVT prophylaxis: Lovenox , monitor platelets Advance Care Planning:   Code Status: Full Code   Consults: None  Family Communication:   Severity of Illness: The appropriate patient status for this patient is INPATIENT. Inpatient status is judged to be reasonable and necessary in order to provide the required intensity of service to ensure the patient's safety. The patient's presenting symptoms, physical exam findings, and initial radiographic and laboratory data in the context of their chronic comorbidities is felt to place them at high risk for further clinical deterioration. Furthermore, it is not anticipated that the patient will be medically stable for discharge from the hospital within 2 midnights of admission.   * I certify that at the point of admission it is my clinical judgment that the patient will require inpatient hospital care spanning beyond 2 midnights from the point of admission due to high intensity of service, high risk for further deterioration and high frequency of surveillance required.*  Author: Maximino DELENA Sharps, MD 08/24/2024 8:41 AM  For on call review [email or web address].      [1] No Known Allergies  "

## 2024-08-24 NOTE — Progress Notes (Signed)
 Orthopedic Tech Progress Note Patient Details:  Edward Mullen 09/04/1955 [id number]  Patient ID: Edward Mullen [name], male   DOB: 02-20-1956, 69 y.o.   MRN: [id number] I spoke with rn. They will call when ready. Chandra Dorn PARAS 08/24/2024, 6:42 AM

## 2024-08-24 NOTE — ED Notes (Signed)
 Pt educated on pain medications including: name, frequency of use, pain score associated with each. Pt verbalized understanding

## 2024-08-24 NOTE — ED Provider Notes (Signed)
 " [place] [facility] AT [facility] Provider Note   CSN: [id number] Arrival date & time: 08/23/24  1924     Patient presents with: Back Pain   Edward Mullen is a 69 y.o. male.   The history is provided by the patient.  Back Pain Location:  Thoracic spine Radiates to:  Does not radiate Pain severity:  Severe Pain is:  Same all the time Onset quality:  Sudden Timing:  Constant Progression:  Worsening Chronicity:  New Context: falling   Relieved by:  Nothing Worsened by:  Nothing Ineffective treatments:  None tried Associated symptoms: abdominal pain   Associated symptoms: no fever   Associated symptoms comment:  Cough, which hurts  Risk factors: no hx of cancer   Patient with HTN and CKD with T9 fracture presents with pain in the abdomen with coughing and hypoxia.      Past Medical History:  Diagnosis Date   Arthritis    feels like it in my legs (09/20/2014)   CAD (coronary artery disease)    Chronic kidney disease, stage 3a (HCC)    Chronic systolic CHF (congestive heart failure) (HCC)    Cocaine  abuse (HCC)    Diabetes mellitus without complication (HCC) 10/05/2021   type 2   Heart murmur    Hypertension    NICM (nonischemic cardiomyopathy) (HCC)    NSVT (nonsustained ventricular tachycardia) (HCC) 09/06/2016   pre diabetes    patient denies     Prior to Admission medications  Medication Sig Start Date End Date Taking? Authorizing Provider  acetaminophen  (TYLENOL ) 500 MG tablet Take 1,000 mg by mouth every 6 (six) hours as needed for mild pain (pain score 1-3) or moderate pain (pain score 4-6).    [provider]  albuterol  (VENTOLIN  HFA) 108 (90 Base) MCG/ACT inhaler INHALE 1 TO 2 PUFFS BY MOUTH EVERY 6 HOURS AS NEEDED FOR WHEEZING OR SHORTNESS OF BREATH 10/29/22   Elnora Ip, MD  amiodarone  (PACERONE ) 200 MG tablet Take 1 tablet (200 mg total) by mouth daily. 03/09/24   Colletta Manuelita Garre, PA-C  ASPIRIN  LOW  DOSE 81 MG tablet TAKE 1 TABLET (81 MG TOTAL) BY MOUTH DAILY. SWALLOW WHOLE(AM) 01/15/24   [place], Harlene HERO, FNP  carvedilol  (COREG ) 3.125 MG tablet Take 1 tablet (3.125 mg total) by mouth 2 (two) times daily. 01/29/22 07/07/25  Glena Harlene HERO, FNP  gabapentin  (NEURONTIN ) 600 MG tablet Take 600 mg by mouth 2 (two) times daily.    [provider]  HYDROcodone -acetaminophen  (NORCO/VICODIN) 5-325 MG tablet Take 1 tablet by mouth every 6 (six) hours as needed. 08/18/24   Griselda Norris, MD  JARDIANCE  10 MG TABS tablet Take 10 mg by mouth daily. 04/03/23   [provider]  lidocaine  (LIDODERM ) 5 % Place 2 patches onto the skin daily. Remove & Discard patch within 12 hours or as directed by MD.  Apply to the left rib area 06/18/24   Amin, Ankit C, MD  losartan  (COZAAR ) 25 MG tablet Take 25 mg by mouth daily.    [provider]  mexiletine (MEXITIL ) 150 MG capsule TAKE 1 CAPSULE BY MOUTH 2 (TWO) TIMES DAILY (AM+BEDTIME) 06/09/24   Rolan Ezra RAMAN, MD  nortriptyline  (PAMELOR ) 75 MG capsule Take 75 mg by mouth daily. 06/07/24   [provider]  polyethylene glycol powder (GLYCOLAX /MIRALAX ) 17 GM/SCOOP powder Take 17 g by mouth 2 (two) times daily as needed for moderate constipation or severe constipation. Dissolve 1 capful (17g) in 4-8  ounces of liquid and take by mouth daily. 06/18/24   Amin, Ankit C, MD  rosuvastatin  (CRESTOR ) 40 MG tablet Take 1 tablet (40 mg total) by mouth daily. 01/22/24   Rolan Ezra RAMAN, MD  senna (SENOKOT) 8.6 MG TABS tablet Take 2 tablets (17.2 mg total) by mouth at bedtime. Patient not taking: Reported on 07/07/2024 06/18/24   Caleen Burgess BROCKS, MD  spironolactone  (ALDACTONE ) 25 MG tablet Take 1 tablet (25 mg total) by mouth daily. 03/09/24   Colletta Manuelita Garre, PA-C  STIOLTO RESPIMAT 2.5-2.5 MCG/ACT AERS Inhale 2 puffs into the lungs daily. 06/11/23   [provider]  tamsulosin  (FLOMAX ) 0.4 MG CAPS capsule Take 0.4 mg by mouth at bedtime.     [provider]  torsemide  (DEMADEX ) 20 MG tablet TAKE 1 TABLET (20 MG TOTAL) BY MOUTH DAILY (AM) 06/09/24   Rolan Ezra RAMAN, MD  zolpidem  (AMBIEN ) 10 MG tablet Take 1 tablet (10 mg total) by mouth at bedtime as needed for sleep. 09/16/22 01/03/23      Allergies: Patient has no known allergies.    Review of Systems  Constitutional:  Negative for fever.  HENT:  Negative for facial swelling.   Respiratory:  Positive for cough.   Gastrointestinal:  Positive for abdominal pain.  Musculoskeletal:  Positive for back pain.  All other systems reviewed and are negative.   Updated Vital Signs BP 117/82   Pulse 66   Temp 97.7 F (36.5 C) (Oral)   Resp (!) 21   SpO2 100%   Physical Exam Vitals and nursing note reviewed.  Constitutional:      General: He is not in acute distress.    Appearance: Normal appearance. He is well-developed. He is not diaphoretic.  HENT:     Head: Normocephalic and atraumatic.     Nose: Nose normal.  Eyes:     Conjunctiva/sclera: Conjunctivae normal.     Pupils: Pupils are equal, round, and reactive to light.  Cardiovascular:     Rate and Rhythm: Normal rate and regular rhythm.     Pulses: Normal pulses.     Heart sounds: Normal heart sounds.  Pulmonary:     Effort: Pulmonary effort is normal.     Breath sounds: Wheezing and rhonchi present. No rales.  Abdominal:     General: Bowel sounds are normal.     Palpations: Abdomen is soft.     Tenderness: There is no abdominal tenderness. There is no guarding or rebound.  Musculoskeletal:        General: Normal range of motion.     Cervical back: Normal range of motion and neck supple.  Skin:    General: Skin is warm and dry.     Capillary Refill: Capillary refill takes less than 2 seconds.  Neurological:     General: No focal deficit present.     Mental Status: He is alert and oriented to person, place, and time.     Deep Tendon Reflexes: Reflexes normal.  Psychiatric:        Mood and Affect:  Mood normal.     (all labs ordered are listed, but only abnormal results are displayed) Results for orders placed or performed during the hospital encounter of 08/23/24  Basic metabolic panel   Collection Time: 08/24/24 12:30 AM  Result Value Ref Range   Sodium 136 135 - 145 mmol/L   Potassium 4.8 3.5 - 5.1 mmol/L   Chloride 102 98 - 111 mmol/L   CO2 23 22 -  32 mmol/L   Glucose, Bld 103 (H) 70 - 99 mg/dL   BUN 28 (H) 8 - 23 mg/dL   Creatinine, Ser 8.24 (H) 0.61 - 1.24 mg/dL   Calcium  9.5 8.9 - 10.3 mg/dL   GFR, Estimated 41 (L) >60 mL/min   Anion gap 12 5 - 15  CBC   Collection Time: 08/24/24 12:30 AM  Result Value Ref Range   WBC 5.2 4.0 - 10.5 K/uL   RBC 5.29 4.22 - 5.81 MIL/uL   Hemoglobin 15.2 13.0 - 17.0 g/dL   HCT 52.4 60.9 - 47.9 %   MCV 89.8 80.0 - 100.0 fL   MCH 28.7 26.0 - 34.0 pg   MCHC 32.0 30.0 - 36.0 g/dL   RDW 85.3 88.4 - 84.4 %   Platelets 109 (L) 150 - 400 K/uL   nRBC 0.0 0.0 - 0.2 %   CT ABDOMEN PELVIS W CONTRAST Result Date: 08/24/2024 EXAM: CT ABDOMEN AND PELVIS WITH CONTRAST 08/24/2024 05:09:24 AM TECHNIQUE: CT of the abdomen and pelvis was performed with the administration of 75 mL of iohexol  (OMNIPAQUE ) 350 MG/ML injection. Multiplanar reformatted images are provided for review. Automated exposure control, iterative reconstruction, and/or weight-based adjustment of the mA/kV was utilized to reduce the radiation dose to as low as reasonably achievable. COMPARISON: CT of the abdomen and pelvis dated 08/18/2024. CLINICAL HISTORY: Polytrauma, blunt. FINDINGS: LOWER CHEST: Healing subacute fractures of the left posterior 9th and 10th ribs. LIVER: The liver is unremarkable. GALLBLADDER AND BILE DUCTS: Gallbladder is unremarkable. No biliary ductal dilatation. SPLEEN: No acute abnormality. PANCREAS: No acute abnormality. ADRENAL GLANDS: No acute abnormality. KIDNEYS, URETERS AND BLADDER: No stones in the kidneys or ureters. No hydronephrosis. No perinephric or  periureteral stranding. Urinary bladder is unremarkable. GI AND BOWEL: Stomach demonstrates no acute abnormality. There is no bowel obstruction. PERITONEUM AND RETROPERITONEUM: No ascites. No free air. VASCULATURE: Aorta is normal in caliber. There are extensive atheromatous calcifications involving the aortoiliac arteries. LYMPH NODES: No lymphadenopathy. REPRODUCTIVE ORGANS: There is moderate prostatomegaly, as before. BONES AND SOFT TISSUES: A subtle nondisplaced fracture of the T9 vertebral body is again demonstrated. The patient is status post interbody fusion and bilateral posterolateral spinal fixation at L3-L4, L4-L5 and L5-S1. Decompression laminectomy changes are also present. No focal soft tissue abnormality. IMPRESSION: 1. Healing subacute fractures of the left posterior ninth and 10th ribs. 2. Subtle nondisplaced fracture of the T9 vertebral body. 3. Status post interbody fusion and bilateral posterolateral spinal fixation at L3-4, L4-5, and L5-S1 with decompression laminectomy changes. 4. Extensive atheromatous calcifications involving the aortoiliac arteries. 5. Moderate prostatomegaly, as before. Electronically signed by: Evalene Coho MD MD 08/24/2024 05:39 AM EST RP Workstation: [id number]   CT Angio Chest PE W and/or Wo Contrast Result Date: 08/24/2024 EXAM: CTA of the Chest without and with contrast for PE 08/24/2024 05:09:24 AM TECHNIQUE: CTA of the chest was performed without and with the administration of 75 mL of iohexol  (OMNIPAQUE ) 350 MG/ML injection. Multiplanar reformatted images are provided for review. MIP images are provided for review. Automated exposure control, iterative reconstruction, and/or weight based adjustment of the mA/kV was utilized to reduce the radiation dose to as low as reasonably achievable. COMPARISON: CT of the chest dated 08/18/2024. CLINICAL HISTORY: Syncope/presyncope, cerebrovascular cause suspected. FINDINGS: PULMONARY ARTERIES: Pulmonary arteries are  adequately opacified for evaluation. No pulmonary embolism. Main pulmonary artery is normal in caliber. MEDIASTINUM: The heart is mildly enlarged and there is calcific coronary artery disease and a cardiac pacemaker present. There  is mild fusiform aneurysmal dilatation of the ascending thoracic aorta, which again measures approximately 4.3 cm in cross-sectional diameter. LYMPH NODES: No mediastinal, hilar or axillary lymphadenopathy. LUNGS AND PLEURA: Mild paraseptal emphysema. New patchy peribronchovascular opacities present posteriorly within the right upper lobe, likely infectious or inflammatory. Ground-glass opacities present independently within the lower lobes bilaterally. The nondependent lungs are clear. No pleural effusion or pneumothorax. UPPER ABDOMEN: Limited images of the upper abdomen are unremarkable. SOFT TISSUES AND BONES: A subtle acute/subacute fracture of the T9 vertebral body is again demonstrated. No acute soft tissue abnormality. IMPRESSION: 1. No evidence of pulmonary embolus. 2. New patchy bronchovascular opacities in the posterior right upper lobe, likely infectious or inflammatory. 3. Ground-glass opacities in the lower lobes bilaterally. 4. Mild fusiform aneurysmal dilatation of the ascending thoracic aorta, measuring approximately 4.3 cm in cross-sectional diameter. 5. Subtle acute/subacute fracture of the T9 vertebral body. 6. Pulmonary emphysema is an independent risk factor for lung cancer. Recommend consideration for evaluation for a low-dose CT lung cancer screening program. Electronically signed by: Evalene Coho MD 08/24/2024 05:29 AM EST RP Workstation: [id number]   DG Chest 2 View Result Date: 08/24/2024 EXAM: 2 VIEW(S) XRAY OF THE CHEST 08/24/2024 12:41:00 AM COMPARISON: 12/28/2023 CLINICAL HISTORY: sob FINDINGS: LUNGS AND PLEURA: No focal pulmonary opacity. No pleural effusion. No pneumothorax. HEART AND MEDIASTINUM: Aortic atherosclerosis (ICD10-I70.0). Left pacer in  place. Mild cardiomegaly. BONES AND SOFT TISSUES: Thoracic degenerative changes. IMPRESSION: 1. No acute cardiopulmonary abnormality. 2. Mild cardiomegaly. 3. Left pacer in place. Electronically signed by: Dorethia Molt MD MD 08/24/2024 12:46 AM EST RP Workstation: [id number]   CT CHEST ABDOMEN PELVIS WO CONTRAST Result Date: 08/18/2024 EXAM: CT CHEST, ABDOMEN AND PELVIS WITHOUT CONTRAST 08/18/2024 03:33:00 AM TECHNIQUE: CT of the chest, abdomen and pelvis was performed without the administration of intravenous contrast. Multiplanar reformatted images are provided for review. Automated exposure control, iterative reconstruction, and/or weight based adjustment of the mA/kV was utilized to reduce the radiation dose to as low as reasonably achievable. COMPARISON: Chest, abdomen, and pelvis CT without contrast 06/16/2024, portable chest 12/28/2023, AP chest 12/23/2023, and CT abdomen and pelvis with IV contrast 07/07/2023. CLINICAL HISTORY: Recent fall with shortness of breath. FINDINGS: CHEST: MEDIASTINUM AND LYMPH NODES: Cardiac size is normal. There is a left chest dual lead pacing system with aid wiring, stable wire insertions. There are 3-vessel coronary calcifications, greatest in the LAD. No pericardial effusion. The central airways are clear. Aortic atherosclerosis and tortuosity. The mid ascending aorta again measures 4.3 cm (reference series 8, image 116). Annual imaging follow-up recommended. The pulmonary arteries and veins are normal in caliber. No mediastinal, hilar or axillary lymphadenopathy. LUNGS AND PLEURA: Diffuse bronchial thickening. Mild paraseptal and centrilobular emphysematous changes in the upper lobes. Posterior atelectasis of the lower lobes. Linear scarlike opacity in the medial left lower lobe. No focal consolidation or pulmonary edema. No contusion. No pleural effusion. No pneumothorax. ABDOMEN AND PELVIS: LIVER: Unremarkable. GALLBLADDER AND BILE DUCTS: The gallbladder contains  layering sludge or tiny noncalcified stones. There is no wall thickening. No biliary ductal dilatation. SPLEEN: Mildly enlarged spleen measuring 13.7 cm. There is homogeneous noncontrast attenuation without evidence of acute injury or mass. PANCREAS: No acute abnormality. ADRENAL GLANDS: No adrenal mass or hemorrhage. KIDNEYS, URETERS AND BLADDER: No contour deforming mass of the kidneys without contrast. Tiny nonobstructive calyceal stones with one in the lower pole of each kidney. No obstructing stone or hydronephrosis. Mild chronic perinephric stranding. No stones in the ureters. No periureteral stranding.  Severely enlarged prostate protruding into the bladder, with median lobe hypertrophy predominating. Maximal diameter 6.9 cm. The bladder is slightly thickened, probably due to hypertrophy and chronic bladder outlet obstruction. There are no inflammatory changes around the bladder. GI AND BOWEL: Stomach demonstrates no acute abnormality. The appendix is normal. There is colonic diverticulosis without evidence of diverticulitis. There is no bowel obstruction. REPRODUCTIVE ORGANS: Prostate findings are described under KIDNEYS, URETERS AND BLADDER. Other visualized reproductive organs show no acute abnormality. PERITONEUM AND RETROPERITONEUM: No ascites. No free air. No free hemorrhage, localizing collections or inflammatory change. VASCULATURE: Aorta is normal in caliber. Aortic and branch vessel atherosclerosis. No aneurysm. ABDOMINAL AND PELVIS LYMPH NODES: No lymphadenopathy. BONES AND SOFT TISSUES: * **Chest:** Subacute, partially healed fractures of the posterior left 9th and 10th ribs. No acute displaced rib fracture is seen. Osteopenia. Multilevel thoracic spine bridging enthesopathy. Acute oblique fracture through the anterior aspect of the T9 vertebral body with mild distraction, including fracture through the enthesopathic bone extending from anteriorly to the right. The fracture through the T9 body  extends from the anterosuperior corner of the vertebral body through the inferior endplate along the central disc interface. No fracture transmission into the pedicles and posterior elements is seen. Mild thickening of the paraspinal soft tissues at this level but no evidence of spinal canal hematoma. No further evidence of thoracic level fractures, including the sternum and visible shoulder girdles. * **Abdomen and Pelvis:** Bilateral moderate hip arthrosis. L3 through S1 dorsal fusion construct with laminectomies and interbody hardware with solid arthrodesis. No fracture is seen at the levels of the abdomen and pelvis. No focal soft tissue abnormality in the abdomen and pelvis. IMPRESSION: 1. Acute oblique fracture through the anterior aspect of the T9 vertebral body with mild distraction, including fracture through the enthesopathic bone extending from anteriorly to the right. 2. No fracture transmission into the pedicles and posterior elements. Mild thickening of the paraspinal soft tissues at this level without evidence of spinal canal hematoma. 3. Subacute, partially healed fractures of the posterior left 9th and 10th ribs. No acute displaced rib fracture is seen. 4. No pneumothorax, pleural effusion, or pulmonary contusion. 5. Mid ascending aorta measures 4.3 cm. Annual imaging follow-up recommended. . 6. Mild upper lobe emphysema. Pulmonary emphysema is an independent risk factor for lung cancer. Recommend consideration for evaluation for a low-dose CT lung cancer screening program. 7. Severe prostatomegaly. 8. Aortic and coronary artery atherosclerosis. Electronically signed by: Francis Quam MD 08/18/2024 04:21 AM EST RP Workstation: [id number]   DG Lumbar Spine Complete Result Date: 08/18/2024 EXAM: 4 VIEW(S) XRAY OF THE LUMBAR SPINE 08/18/2024 12:18:00 AM COMPARISON: 06/28/2019. CLINICAL HISTORY: TRAUMA. FINDINGS: LUMBAR SPINE: BONES: Vertebral body heights are maintained. Alignment is normal.  Posterior fusion from L3 to S1. No hardware complicating feature. No fracture. DISCS AND DEGENERATIVE CHANGES: Diffuse degenerative facet disease throughout the lumbar spine. SOFT TISSUES: No acute abnormality. IMPRESSION: 1. No acute bony abnormality. Electronically signed by: Franky Crease MD 08/18/2024 12:28 AM EST RP Workstation: [id number]   DG Thoracic Spine 2 View Result Date: 08/18/2024 EXAM: 2 VIEW(S) XRAY OF THE THORACIC SPINE 08/18/2024 12:16:00 AM COMPARISON: None available. CLINICAL HISTORY: TRAUMA FINDINGS: BONES: Vertebral body heights are maintained. Alignment is normal. DISCS AND DEGENERATIVE CHANGES: Flowing anterior osteophytes. SOFT TISSUES: The visualized lungs are clear. IMPRESSION: 1. No evidence of acute traumatic injury. Electronically signed by: Franky Crease MD 08/18/2024 12:27 AM EST RP Workstation: [id number]   CT Cervical Spine Wo Contrast Result Date: 08/17/2024 EXAM:  CT CERVICAL SPINE WITHOUT CONTRAST 08/17/2024 10:15:17 PM TECHNIQUE: CT of the cervical spine was performed without the administration of intravenous contrast. Multiplanar reformatted images are provided for review. Automated exposure control, iterative reconstruction, and/or weight based adjustment of the mA/kV was utilized to reduce the radiation dose to as low as reasonably achievable. COMPARISON: 06/16/2024 CLINICAL HISTORY: Polytrauma, blunt. FINDINGS: BONES AND ALIGNMENT: No acute fracture or traumatic malalignment. DEGENERATIVE CHANGES: Degenerative disc disease most pronounced from C4-C5 through C6-C7. Mild bilateral degenerative facet disease. SOFT TISSUES: No prevertebral soft tissue swelling. IMPRESSION: 1. No acute traumatic injury. 2. Multilevel degenerative changes. Electronically signed by: Franky Crease MD 08/17/2024 10:24 PM EST RP Workstation: [id number]   CT Head Wo Contrast Result Date: 08/17/2024 EXAM: CT HEAD WITHOUT CONTRAST 08/17/2024 10:15:17 PM TECHNIQUE: CT of the head was performed without the  administration of intravenous contrast. Automated exposure control, iterative reconstruction, and/or weight based adjustment of the mA/kV was utilized to reduce the radiation dose to as low as reasonably achievable. COMPARISON: 06/16/2024 CLINICAL HISTORY: Head trauma, moderate-severe FINDINGS: BRAIN AND VENTRICLES: No acute hemorrhage. No evidence of acute infarct. No hydrocephalus. No extra-axial collection. No mass effect or midline shift. Atrophy and chronic small vessel disease throughout the deep white matter. ORBITS: No acute abnormality. SINUSES: No acute abnormality. SOFT TISSUES AND SKULL: Posterior scalp hematoma. No skull fracture. IMPRESSION: 1. No acute intracranial abnormality. 2. Posterior scalp hematoma. Electronically signed by: Franky Crease MD 08/17/2024 10:21 PM EST RP Workstation: [id number]    EKG: None  Radiology: CT ABDOMEN PELVIS W CONTRAST Result Date: 08/24/2024 EXAM: CT ABDOMEN AND PELVIS WITH CONTRAST 08/24/2024 05:09:24 AM TECHNIQUE: CT of the abdomen and pelvis was performed with the administration of 75 mL of iohexol  (OMNIPAQUE ) 350 MG/ML injection. Multiplanar reformatted images are provided for review. Automated exposure control, iterative reconstruction, and/or weight-based adjustment of the mA/kV was utilized to reduce the radiation dose to as low as reasonably achievable. COMPARISON: CT of the abdomen and pelvis dated 08/18/2024. CLINICAL HISTORY: Polytrauma, blunt. FINDINGS: LOWER CHEST: Healing subacute fractures of the left posterior 9th and 10th ribs. LIVER: The liver is unremarkable. GALLBLADDER AND BILE DUCTS: Gallbladder is unremarkable. No biliary ductal dilatation. SPLEEN: No acute abnormality. PANCREAS: No acute abnormality. ADRENAL GLANDS: No acute abnormality. KIDNEYS, URETERS AND BLADDER: No stones in the kidneys or ureters. No hydronephrosis. No perinephric or periureteral stranding. Urinary bladder is unremarkable. GI AND BOWEL: Stomach demonstrates no acute  abnormality. There is no bowel obstruction. PERITONEUM AND RETROPERITONEUM: No ascites. No free air. VASCULATURE: Aorta is normal in caliber. There are extensive atheromatous calcifications involving the aortoiliac arteries. LYMPH NODES: No lymphadenopathy. REPRODUCTIVE ORGANS: There is moderate prostatomegaly, as before. BONES AND SOFT TISSUES: A subtle nondisplaced fracture of the T9 vertebral body is again demonstrated. The patient is status post interbody fusion and bilateral posterolateral spinal fixation at L3-L4, L4-L5 and L5-S1. Decompression laminectomy changes are also present. No focal soft tissue abnormality. IMPRESSION: 1. Healing subacute fractures of the left posterior ninth and 10th ribs. 2. Subtle nondisplaced fracture of the T9 vertebral body. 3. Status post interbody fusion and bilateral posterolateral spinal fixation at L3-4, L4-5, and L5-S1 with decompression laminectomy changes. 4. Extensive atheromatous calcifications involving the aortoiliac arteries. 5. Moderate prostatomegaly, as before. Electronically signed by: Evalene Coho MD MD 08/24/2024 05:39 AM EST RP Workstation: [id number]   CT Angio Chest PE W and/or Wo Contrast Result Date: 08/24/2024 EXAM: CTA of the Chest without and with contrast for PE 08/24/2024 05:09:24 AM TECHNIQUE: CTA of  the chest was performed without and with the administration of 75 mL of iohexol  (OMNIPAQUE ) 350 MG/ML injection. Multiplanar reformatted images are provided for review. MIP images are provided for review. Automated exposure control, iterative reconstruction, and/or weight based adjustment of the mA/kV was utilized to reduce the radiation dose to as low as reasonably achievable. COMPARISON: CT of the chest dated 08/18/2024. CLINICAL HISTORY: Syncope/presyncope, cerebrovascular cause suspected. FINDINGS: PULMONARY ARTERIES: Pulmonary arteries are adequately opacified for evaluation. No pulmonary embolism. Main pulmonary artery is normal in caliber.  MEDIASTINUM: The heart is mildly enlarged and there is calcific coronary artery disease and a cardiac pacemaker present. There is mild fusiform aneurysmal dilatation of the ascending thoracic aorta, which again measures approximately 4.3 cm in cross-sectional diameter. LYMPH NODES: No mediastinal, hilar or axillary lymphadenopathy. LUNGS AND PLEURA: Mild paraseptal emphysema. New patchy peribronchovascular opacities present posteriorly within the right upper lobe, likely infectious or inflammatory. Ground-glass opacities present independently within the lower lobes bilaterally. The nondependent lungs are clear. No pleural effusion or pneumothorax. UPPER ABDOMEN: Limited images of the upper abdomen are unremarkable. SOFT TISSUES AND BONES: A subtle acute/subacute fracture of the T9 vertebral body is again demonstrated. No acute soft tissue abnormality. IMPRESSION: 1. No evidence of pulmonary embolus. 2. New patchy bronchovascular opacities in the posterior right upper lobe, likely infectious or inflammatory. 3. Ground-glass opacities in the lower lobes bilaterally. 4. Mild fusiform aneurysmal dilatation of the ascending thoracic aorta, measuring approximately 4.3 cm in cross-sectional diameter. 5. Subtle acute/subacute fracture of the T9 vertebral body. 6. Pulmonary emphysema is an independent risk factor for lung cancer. Recommend consideration for evaluation for a low-dose CT lung cancer screening program. Electronically signed by: Evalene Coho MD 08/24/2024 05:29 AM EST RP Workstation: [id number]   DG Chest 2 View Result Date: 08/24/2024 EXAM: 2 VIEW(S) XRAY OF THE CHEST 08/24/2024 12:41:00 AM COMPARISON: 12/28/2023 CLINICAL HISTORY: sob FINDINGS: LUNGS AND PLEURA: No focal pulmonary opacity. No pleural effusion. No pneumothorax. HEART AND MEDIASTINUM: Aortic atherosclerosis (ICD10-I70.0). Left pacer in place. Mild cardiomegaly. BONES AND SOFT TISSUES: Thoracic degenerative changes. IMPRESSION: 1. No acute  cardiopulmonary abnormality. 2. Mild cardiomegaly. 3. Left pacer in place. Electronically signed by: Dorethia Molt MD MD 08/24/2024 12:46 AM EST RP Workstation: [id number]     Procedures   Medications Ordered in the ED  albuterol  (PROVENTIL ) (2.5 MG/3ML) 0.083% nebulizer solution 5 mg (has no administration in time range)  ipratropium (ATROVENT ) nebulizer solution 0.5 mg (has no administration in time range)  vancomycin  (VANCOREADY) IVPB 1500 mg/300 mL (has no administration in time range)  HYDROcodone -acetaminophen  (NORCO/VICODIN) 5-325 MG per tablet 1 tablet (1 tablet Oral Given 08/23/24 2022)  iohexol  (OMNIPAQUE ) 350 MG/ML injection 75 mL (75 mLs Intravenous Contrast Given 08/24/24 0505)                                    Medical Decision Making Patient with back pain and cough  Amount and/or Complexity of Data Reviewed External Data Reviewed: radiology and notes.    Details: Previous notes reviewed  Labs: ordered.    Details: Normal sodium 136, normal potassium 4.8, elevated creatinine 1.75 normal white count 5.2, normal hemoglobin 15.2  Radiology: ordered and independent interpretation performed.    Details: No PE by me on CTA  Risk Prescription drug management.     Final diagnoses:  None   The patient appears reasonably stabilized for admission considering the current resources, flow, and  capabilities available in the ED at this time, and I doubt any other [facility] requiring further screening and/or treatment in the ED prior to admission.  ED Discharge Orders     None          Khushi Zupko, MD 08/24/24 [id number]  "

## 2024-08-24 NOTE — ED Triage Notes (Signed)
 Pt now having sob. Placed on 3lpm via [place] in triage spo2 was 68% on RA.

## 2024-08-25 DIAGNOSIS — J9601 Acute respiratory failure with hypoxia: Secondary | ICD-10-CM | POA: Diagnosis not present

## 2024-08-25 LAB — COMPREHENSIVE METABOLIC PANEL WITH GFR
ALT: 13 U/L (ref 0–44)
AST: 35 U/L (ref 15–41)
Albumin: 3.8 g/dL (ref 3.5–5.0)
Alkaline Phosphatase: 145 U/L — ABNORMAL HIGH (ref 38–126)
Anion gap: 11 (ref 5–15)
BUN: 32 mg/dL — ABNORMAL HIGH (ref 8–23)
CO2: 23 mmol/L (ref 22–32)
Calcium: 9.5 mg/dL (ref 8.9–10.3)
Chloride: 101 mmol/L (ref 98–111)
Creatinine, Ser: 1.75 mg/dL — ABNORMAL HIGH (ref 0.61–1.24)
GFR, Estimated: 41 mL/min — ABNORMAL LOW
Glucose, Bld: 88 mg/dL (ref 70–99)
Potassium: 5.7 mmol/L — ABNORMAL HIGH (ref 3.5–5.1)
Sodium: 135 mmol/L (ref 135–145)
Total Bilirubin: 0.5 mg/dL (ref 0.0–1.2)
Total Protein: 8 g/dL (ref 6.5–8.1)

## 2024-08-25 LAB — CBC
HCT: 45 % (ref 39.0–52.0)
Hemoglobin: 14.4 g/dL (ref 13.0–17.0)
MCH: 28.6 pg (ref 26.0–34.0)
MCHC: 32 g/dL (ref 30.0–36.0)
MCV: 89.3 fL (ref 80.0–100.0)
Platelets: 113 K/uL — ABNORMAL LOW (ref 150–400)
RBC: 5.04 MIL/uL (ref 4.22–5.81)
RDW: 14.5 % (ref 11.5–15.5)
WBC: 4 K/uL (ref 4.0–10.5)
nRBC: 0 % (ref 0.0–0.2)

## 2024-08-25 LAB — TSH: TSH: 1.03 u[IU]/mL (ref 0.350–4.500)

## 2024-08-25 LAB — VITAMIN D 25 HYDROXY (VIT D DEFICIENCY, FRACTURES): Vit D, 25-Hydroxy: 25.7 ng/mL — ABNORMAL LOW (ref 30–100)

## 2024-08-25 LAB — GLUCOSE, CAPILLARY
Glucose-Capillary: 105 mg/dL — ABNORMAL HIGH (ref 70–99)
Glucose-Capillary: 124 mg/dL — ABNORMAL HIGH (ref 70–99)

## 2024-08-25 LAB — STREP PNEUMONIAE URINARY ANTIGEN: Strep Pneumo Urinary Antigen: NEGATIVE

## 2024-08-25 LAB — MRSA NEXT GEN BY PCR, NASAL: MRSA by PCR Next Gen: DETECTED — AB

## 2024-08-25 MED ORDER — CHLORHEXIDINE GLUCONATE CLOTH 2 % EX PADS
6.0000 | MEDICATED_PAD | Freq: Every day | CUTANEOUS | Status: DC
  Administered 2024-08-25 – 2024-08-27 (×3): 6 via TOPICAL

## 2024-08-25 MED ORDER — VITAMIN D 25 MCG (1000 UNIT) PO TABS
1000.0000 [IU] | ORAL_TABLET | Freq: Every day | ORAL | Status: DC
  Administered 2024-08-25 – 2024-08-27 (×3): 1000 [IU] via ORAL
  Filled 2024-08-25 (×3): qty 1

## 2024-08-25 MED ORDER — HYDRALAZINE HCL 20 MG/ML IJ SOLN: 10.0000 mg | INTRAMUSCULAR | Status: DC | PRN

## 2024-08-25 MED ORDER — INSULIN ASPART 100 UNIT/ML IJ SOLN
0.0000 [IU] | Freq: Three times a day (TID) | INTRAMUSCULAR | Status: DC
  Filled 2024-08-25: qty 2

## 2024-08-25 MED ORDER — GUAIFENESIN 100 MG/5ML PO LIQD: 5.0000 mL | ORAL | Status: DC | PRN

## 2024-08-25 MED ORDER — POLYETHYLENE GLYCOL 3350 17 G PO PACK
17.0000 g | PACK | Freq: Two times a day (BID) | ORAL | Status: DC
  Administered 2024-08-25 – 2024-08-27 (×4): 17 g via ORAL
  Filled 2024-08-25 (×4): qty 1

## 2024-08-25 MED ORDER — SENNOSIDES-DOCUSATE SODIUM 8.6-50 MG PO TABS: 1.0000 | ORAL_TABLET | Freq: Every evening | ORAL | Status: DC | PRN

## 2024-08-25 MED ORDER — MUPIROCIN 2 % EX OINT
1.0000 | TOPICAL_OINTMENT | Freq: Two times a day (BID) | CUTANEOUS | Status: DC
  Administered 2024-08-25 – 2024-08-27 (×5): 1 via NASAL
  Filled 2024-08-25: qty 22

## 2024-08-25 MED ORDER — ACETAMINOPHEN 500 MG PO TABS
1000.0000 mg | ORAL_TABLET | Freq: Three times a day (TID) | ORAL | Status: DC
  Administered 2024-08-25 – 2024-08-27 (×7): 1000 mg via ORAL
  Filled 2024-08-25 (×7): qty 2

## 2024-08-25 MED ORDER — LIDOCAINE 5 % EX PTCH
2.0000 | MEDICATED_PATCH | CUTANEOUS | Status: DC
  Administered 2024-08-25 – 2024-08-27 (×3): 2 via TRANSDERMAL
  Filled 2024-08-25 (×3): qty 2

## 2024-08-25 MED ORDER — GLUCAGON HCL RDNA (DIAGNOSTIC) 1 MG IJ SOLR: 1.0000 mg | INTRAMUSCULAR | Status: DC | PRN

## 2024-08-25 MED ORDER — METOPROLOL TARTRATE 5 MG/5ML IV SOLN: 5.0000 mg | INTRAVENOUS | Status: DC | PRN

## 2024-08-25 MED ORDER — ONDANSETRON HCL 4 MG/2ML IJ SOLN: 4.0000 mg | Freq: Four times a day (QID) | INTRAMUSCULAR | Status: DC | PRN

## 2024-08-25 MED ORDER — SODIUM ZIRCONIUM CYCLOSILICATE 10 G PO PACK
10.0000 g | PACK | Freq: Two times a day (BID) | ORAL | Status: AC
  Administered 2024-08-25 (×2): 10 g via ORAL
  Filled 2024-08-25 (×2): qty 1

## 2024-08-25 MED ORDER — INSULIN ASPART 100 UNIT/ML IJ SOLN: 0.0000 [IU] | Freq: Every day | INTRAMUSCULAR | Status: DC

## 2024-08-25 MED ORDER — IPRATROPIUM-ALBUTEROL 0.5-2.5 (3) MG/3ML IN SOLN: 3.0000 mL | RESPIRATORY_TRACT | Status: DC | PRN

## 2024-08-25 MED ORDER — TRAZODONE HCL 50 MG PO TABS
50.0000 mg | ORAL_TABLET | Freq: Every evening | ORAL | Status: DC | PRN
  Administered 2024-08-25 – 2024-08-26 (×2): 50 mg via ORAL
  Filled 2024-08-25 (×2): qty 1

## 2024-08-25 NOTE — Progress Notes (Signed)
 " PROGRESS NOTE    Edward Mullen  FMW:[id number] DOB: [birth year] DOA: 08/23/2024 PCP: Leontine Cramp, NP    Brief Narrative:  69 year old with history of CHF EF 35%, V. tach cardiac arrest status post ICD, CAD status post PCI, polysubstance abuse admitted for back pain and cough.  Recently has been feeling very weak and has had occasional falls.  In the ER noted to be hypoxic saturating 68% on room air.  CTA CAP showed bilateral ground glass opacity, pulmonary emphysema, subacute T9 and left-sided rib fractures, BPH.  Admitted to the hospital for pain control and concerns of community-acquired pneumonia.   Assessment & Plan:   Acute respiratory failure with hypoxia secondary to community-acquired pneumonia Acute COPD exacerbation -Hypoxia is multifactorial with combination of community-acquired pneumonia, COPD exacerbation and likely atelectasis from rib fractures. - Continue Rocephin , bronchodilators scheduled and as needed, I-S/flutter valve - Mucinex  twice daily - Prednisone  40 mg X5 days    Constipation Bowel regimen  History of left posterior rib fractures T9 fracture Multiple falls Prior to arrival.  Likely from multiple recent falls.  For now we will proceed with pain control.  He can use TLSO brace especially while ambulating. -PT/OT TSH is normal  Vit D Def -Vit D Supplements.    Chronic congestive heart failure with reduced EF, 35% Echocardiogram in June 2025 shows EF of 35%.  No obvious signs of volume overload. - Continue Coreg , losartan , torsemide , and spironolactone    History of VT S/p AICD - Continue  amiodarone  200 mg daily and mexiletine 150 mg BID   Controlled Diabetes mellitus type 2, without long-term use of insulin  Last hemoglobin A1c noted to be 5.6 when checked on 06/16/2024. - Continue Jardiance  -Sliding scale and Accu-Cheks   Chronic kidney disease stage IIIb Creatinine around baseline 1.8   Thrombocytopenia Chronic.  Platelet count noted to  be 109 which appears similar to priors. - Continue to monitor   AAA CT scan revealed dilation of the ascending thoracic aorta measuring approximately 4.3 cm - Recommend outpatient surveillance   Polysubstance abuse Patient reports continued use of marijuana, but reports not recently using cocaine  in the last month. - continue to counsel need of cessation of substance use     DVT prophylaxis: enoxaparin  (LOVENOX ) injection 40 mg Start: 08/24/24 1000    Code Status: Full Code Family Communication:   Status is: Inpatient Remains inpatient appropriate because: Multiple ongoing issues.  Continue hospital stay.   PT Follow up Recs:   Subjective:  Left sided chest discomfort.   Examination:  General exam: Appears calm and comfortable  Respiratory system: Clear to auscultation. Respiratory effort normal. Cardiovascular system: S1 & S2 heard, RRR. No JVD, murmurs, rubs, gallops or clicks. No pedal edema. Gastrointestinal system: Abdomen is nondistended, soft and nontender. No organomegaly or masses felt. Normal bowel sounds heard. Central nervous system: Alert and oriented. No focal neurological deficits. Extremities: Symmetric 5 x 5 power. Skin: No rashes, lesions or ulcers Psychiatry: Judgement and insight appear normal. Mood & affect appropriate.                Diet Orders (From admission, onward)     Start     Ordered   08/24/24 0845  Diet Heart Room service appropriate? Yes; Fluid consistency: Thin  Diet effective now       Question Answer Comment  Room service appropriate? Yes   Fluid consistency: Thin      08/24/24 0844  Objective: Vitals:   08/25/24 0500 08/25/24 0739 08/25/24 0844 08/25/24 1118  BP:  100/68  95/73  Pulse:  72 73 69  Resp:  18 18 18   Temp:  97.9 F (36.6 C)  (!) 97.4 F (36.3 C)  TempSrc:      SpO2:  96% 98% 98%  Weight: 100.4 kg       Intake/Output Summary (Last 24 hours) at 08/25/2024 1303 Last data filed at  08/25/2024 1053 Gross per 24 hour  Intake 100 ml  Output 1450 ml  Net -1350 ml   Filed Weights   08/25/24 0500  Weight: 100.4 kg    Scheduled Meds:  acetaminophen   1,000 mg Oral TID   amiodarone   200 mg Oral Daily   arformoterol   15 mcg Nebulization BID   And   umeclidinium bromide   1 puff Inhalation Daily   aspirin  EC  81 mg Oral Daily   carvedilol   3.125 mg Oral BID PC   Chlorhexidine  Gluconate Cloth  6 each Topical Daily   cholecalciferol   1,000 Units Oral Daily   empagliflozin   10 mg Oral Daily   enoxaparin  (LOVENOX ) injection  40 mg Subcutaneous Q24H   gabapentin   600 mg Oral TID   guaiFENesin   600 mg Oral BID   insulin  aspart  0-5 Units Subcutaneous QHS   insulin  aspart  0-9 Units Subcutaneous TID WC   lidocaine   2 patch Transdermal Q24H   mexiletine  150 mg Oral Q12H   mupirocin  ointment  1 Application Nasal BID   nortriptyline   75 mg Oral QHS   polyethylene glycol  17 g Oral BID   predniSONE   40 mg Oral Q breakfast   rosuvastatin   40 mg Oral QHS   senna-docusate  1 tablet Oral BID   sodium zirconium cyclosilicate   10 g Oral BID   tamsulosin   0.4 mg Oral QHS   torsemide   20 mg Oral Daily   Continuous Infusions:  cefTRIAXone  (ROCEPHIN )  IV 2 g (08/24/24 2145)    Nutritional status     Body mass index is 30.02 kg/m.  Data Reviewed:   CBC: Recent Labs  Lab 08/24/24 0030 08/25/24 0240  WBC 5.2 4.0  HGB 15.2 14.4  HCT 47.5 45.0  MCV 89.8 89.3  PLT 109* 113*   Basic Metabolic Panel: Recent Labs  Lab 08/24/24 0030 08/25/24 0240  NA 136 135  K 4.8 5.7*  CL 102 101  CO2 23 23  GLUCOSE 103* 88  BUN 28* 32*  CREATININE 1.75* 1.75*  CALCIUM  9.5 9.5   GFR: Estimated Creatinine Clearance: 48.2 mL/min (A) (by C-G formula based on SCr of 1.75 mg/dL (H)). Liver Function Tests: Recent Labs  Lab 08/24/24 0030 08/25/24 0240  AST 38 35  ALT 15 13  ALKPHOS 143* 145*  BILITOT 0.7 0.5  PROT 8.1 8.0  ALBUMIN  3.9 3.8   No results for input(s):  LIPASE, AMYLASE in the last 168 hours. No results for input(s): AMMONIA in the last 168 hours. Coagulation Profile: No results for input(s): INR, PROTIME in the last 168 hours. Cardiac Enzymes: No results for input(s): CKTOTAL, CKMB, CKMBINDEX, TROPONINI in the last 168 hours. BNP (last 3 results) No results for input(s): PROBNP in the last 8760 hours. HbA1C: No results for input(s): HGBA1C in the last 72 hours. CBG: No results for input(s): GLUCAP in the last 168 hours. Lipid Profile: No results for input(s): CHOL, HDL, LDLCALC, TRIG, CHOLHDL, LDLDIRECT in the last 72 hours. Thyroid  Function Tests: Recent  Labs    08/25/24 1013  TSH 1.030   Anemia Panel: No results for input(s): VITAMINB12, FOLATE, FERRITIN, TIBC, IRON, RETICCTPCT in the last 72 hours. Sepsis Labs: Recent Labs  Lab 08/24/24 0030  PROCALCITON 0.11    Recent Results (from the past 240 hours)  Blood culture (routine x 2)     Status: None (Preliminary result)   Collection Time: 08/24/24  6:39 AM   Specimen: BLOOD RIGHT HAND  Result Value Ref Range Status   Specimen Description BLOOD RIGHT HAND  Final   Special Requests   Final    BOTTLES DRAWN AEROBIC AND ANAEROBIC Blood Culture adequate volume   Culture   Final    NO GROWTH 1 DAY Performed at [facility], [address], [place], [place] [address]    Report Status PENDING  Incomplete  Blood culture (routine x 2)     Status: None (Preliminary result)   Collection Time: 08/24/24  6:48 AM   Specimen: BLOOD LEFT HAND  Result Value Ref Range Status   Specimen Description BLOOD LEFT HAND  Final   Special Requests   Final    BOTTLES DRAWN AEROBIC AND ANAEROBIC Blood Culture adequate volume   Culture   Final    NO GROWTH 1 DAY Performed at [facility], [address], [place], [place] [address]    Report Status PENDING  Incomplete  MRSA Next Gen by PCR, Nasal     Status: Abnormal   Collection  Time: 08/24/24  8:02 PM   Specimen: Nasal Mucosa; Nasal Swab  Result Value Ref Range Status   MRSA by PCR Next Gen DETECTED (A) NOT DETECTED Final    Comment: RESULTS CALLED TO, READ BACK BY AND VERIFIED WITH: RN D.TEMBERTON ON 08/25/24 AT 0017 BY NM (NOTE) The GeneXpert MRSA Assay (FDA approved for NASAL specimens only), is one component of a comprehensive MRSA colonization surveillance program. It is not intended to diagnose MRSA infection nor to guide or monitor treatment for MRSA infections. Test performance is not FDA approved in patients less than 33 years old. Performed at [facility], [address] [place], [place] [address]          Radiology Studies: CT ABDOMEN PELVIS W CONTRAST Result Date: 08/24/2024 EXAM: CT ABDOMEN AND PELVIS WITH CONTRAST 08/24/2024 05:09:24 AM TECHNIQUE: CT of the abdomen and pelvis was performed with the administration of 75 mL of iohexol  (OMNIPAQUE ) 350 MG/ML injection. Multiplanar reformatted images are provided for review. Automated exposure control, iterative reconstruction, and/or weight-based adjustment of the mA/kV was utilized to reduce the radiation dose to as low as reasonably achievable. COMPARISON: CT of the abdomen and pelvis dated 08/18/2024. CLINICAL HISTORY: Polytrauma, blunt. FINDINGS: LOWER CHEST: Healing subacute fractures of the left posterior 9th and 10th ribs. LIVER: The liver is unremarkable. GALLBLADDER AND BILE DUCTS: Gallbladder is unremarkable. No biliary ductal dilatation. SPLEEN: No acute abnormality. PANCREAS: No acute abnormality. ADRENAL GLANDS: No acute abnormality. KIDNEYS, URETERS AND BLADDER: No stones in the kidneys or ureters. No hydronephrosis. No perinephric or periureteral stranding. Urinary bladder is unremarkable. GI AND BOWEL: Stomach demonstrates no acute abnormality. There is no bowel obstruction. PERITONEUM AND RETROPERITONEUM: No ascites. No free air. VASCULATURE: Aorta is normal in caliber. There are  extensive atheromatous calcifications involving the aortoiliac arteries. LYMPH NODES: No lymphadenopathy. REPRODUCTIVE ORGANS: There is moderate prostatomegaly, as before. BONES AND SOFT TISSUES: A subtle nondisplaced fracture of the T9 vertebral body is again demonstrated. The patient is  status post interbody fusion and bilateral posterolateral spinal fixation at L3-L4, L4-L5 and L5-S1. Decompression laminectomy changes are also present. No focal soft tissue abnormality. IMPRESSION: 1. Healing subacute fractures of the left posterior ninth and 10th ribs. 2. Subtle nondisplaced fracture of the T9 vertebral body. 3. Status post interbody fusion and bilateral posterolateral spinal fixation at L3-4, L4-5, and L5-S1 with decompression laminectomy changes. 4. Extensive atheromatous calcifications involving the aortoiliac arteries. 5. Moderate prostatomegaly, as before. Electronically signed by: Evalene Coho MD MD 08/24/2024 05:39 AM EST RP Workstation: [id number]   CT Angio Chest PE W and/or Wo Contrast Result Date: 08/24/2024 EXAM: CTA of the Chest without and with contrast for PE 08/24/2024 05:09:24 AM TECHNIQUE: CTA of the chest was performed without and with the administration of 75 mL of iohexol  (OMNIPAQUE ) 350 MG/ML injection. Multiplanar reformatted images are provided for review. MIP images are provided for review. Automated exposure control, iterative reconstruction, and/or weight based adjustment of the mA/kV was utilized to reduce the radiation dose to as low as reasonably achievable. COMPARISON: CT of the chest dated 08/18/2024. CLINICAL HISTORY: Syncope/presyncope, cerebrovascular cause suspected. FINDINGS: PULMONARY ARTERIES: Pulmonary arteries are adequately opacified for evaluation. No pulmonary embolism. Main pulmonary artery is normal in caliber. MEDIASTINUM: The heart is mildly enlarged and there is calcific coronary artery disease and a cardiac pacemaker present. There is mild fusiform  aneurysmal dilatation of the ascending thoracic aorta, which again measures approximately 4.3 cm in cross-sectional diameter. LYMPH NODES: No mediastinal, hilar or axillary lymphadenopathy. LUNGS AND PLEURA: Mild paraseptal emphysema. New patchy peribronchovascular opacities present posteriorly within the right upper lobe, likely infectious or inflammatory. Ground-glass opacities present independently within the lower lobes bilaterally. The nondependent lungs are clear. No pleural effusion or pneumothorax. UPPER ABDOMEN: Limited images of the upper abdomen are unremarkable. SOFT TISSUES AND BONES: A subtle acute/subacute fracture of the T9 vertebral body is again demonstrated. No acute soft tissue abnormality. IMPRESSION: 1. No evidence of pulmonary embolus. 2. New patchy bronchovascular opacities in the posterior right upper lobe, likely infectious or inflammatory. 3. Ground-glass opacities in the lower lobes bilaterally. 4. Mild fusiform aneurysmal dilatation of the ascending thoracic aorta, measuring approximately 4.3 cm in cross-sectional diameter. 5. Subtle acute/subacute fracture of the T9 vertebral body. 6. Pulmonary emphysema is an independent risk factor for lung cancer. Recommend consideration for evaluation for a low-dose CT lung cancer screening program. Electronically signed by: Evalene Coho MD 08/24/2024 05:29 AM EST RP Workstation: [id number]   DG Chest 2 View Result Date: 08/24/2024 EXAM: 2 VIEW(S) XRAY OF THE CHEST 08/24/2024 12:41:00 AM COMPARISON: 12/28/2023 CLINICAL HISTORY: sob FINDINGS: LUNGS AND PLEURA: No focal pulmonary opacity. No pleural effusion. No pneumothorax. HEART AND MEDIASTINUM: Aortic atherosclerosis (ICD10-I70.0). Left pacer in place. Mild cardiomegaly. BONES AND SOFT TISSUES: Thoracic degenerative changes. IMPRESSION: 1. No acute cardiopulmonary abnormality. 2. Mild cardiomegaly. 3. Left pacer in place. Electronically signed by: Dorethia Molt MD MD 08/24/2024 12:46 AM EST  RP Workstation: [id number]           LOS: 1 day   Time spent= 35 mins    Burgess JAYSON Dare, MD [facility]  If 7PM-7AM, please contact night-coverage  08/25/2024, 1:03 PM  "

## 2024-08-25 NOTE — Plan of Care (Signed)

## 2024-08-25 NOTE — Evaluation (Signed)
 Occupational Therapy Evaluation Patient Details Name: Edward Mullen MRN: [id number] DOB: 1955-12-20 Today's Date: 08/25/2024   History of Present Illness   Pt is 69 yo presenting to [facility] on 1/12 due to back pain and cough following a fall. Pt was placed on 3L O2 via [place] in the ER due to 68% O2 sats. CT with findings of T9 vertebrae sub acute fractures and healing subacute fractures of L posterior 9th and 10th ribs. PMH includes: CKD III, CHF, polysubstance abuse, BPH, vtach, arrest requiring ICD placement, CAD s/p stent placement.     Clinical Impressions Pt seen for OT evaluation, agreeable for session. PTA, pt lived with family and was indep with ADLs and mobility with SPC vs RW. Reports needing increased time to get dressed since his fall ~1 week ago. He presents today with intact strength and endorsing 7/10 pain in back (improving with mobility). He was mostly CGA for all mobility via RW and needed mod A for UB/LB ADLs. Discussed back precautions and potential for adaptive equipment (will plan to addressed next visit). VSS; left upright in chair.  Pt is currently functioning below baseline and would benefit from ongoing acute OT services to progress towards safe discharge and to facilitate return to prior level of function. Current recommendation is home with home health OT.     If plan is discharge home, recommend the following:   A little help with bathing/dressing/bathroom;Assistance with cooking/housework;Assist for transportation     Functional Status Assessment   Patient has had a recent decline in their functional status and demonstrates the ability to make significant improvements in function in a reasonable and predictable amount of time.     Equipment Recommendations   None recommended by OT     Recommendations for Other Services         Precautions/Restrictions   Precautions Precautions: Fall;Back Precaution Booklet Issued: No (verbally  reviewed) Recall of Precautions/Restrictions: Intact Required Braces or Orthoses: Spinal Brace Spinal Brace: Thoracolumbosacral orthotic Restrictions Weight Bearing Restrictions Per Provider Order: No     Mobility Bed Mobility Overal bed mobility: Needs Assistance Bed Mobility: Rolling, Sidelying to Sit Rolling: Supervision Sidelying to sit: Contact guard assist       General bed mobility comments: incr time/effort to transition EOB, cued for log roll technique    Transfers Overall transfer level: Needs assistance   Transfers: Sit to/from Stand, Bed to chair/wheelchair/BSC Sit to Stand: Contact guard assist     Step pivot transfers: Contact guard assist     General transfer comment: Incr effort to come to stance, cued for hand placement relative to RW.      Balance Overall balance assessment: Needs assistance Sitting-balance support: No upper extremity supported, Feet supported Sitting balance-Leahy Scale: Good Sitting balance - Comments: seated EOB, no LOB observed   Standing balance support: Bilateral upper extremity supported, During functional activity, Reliant on assistive device for balance Standing balance-Leahy Scale: Fair Standing balance comment: reliant on RW, but able to stand sinkside during ADLs without UE support without LOB                           ADL either performed or assessed with clinical judgement   ADL Overall ADL's : Needs assistance/impaired Eating/Feeding: Set up;Sitting   Grooming: Contact guard assist;Standing;Wash/dry hands Grooming Details (indicate cue type and reason): sinkside         Upper Body Dressing : Moderate assistance;Sitting Upper Body Dressing Details (indicate cue  type and reason): assist for donning TLSO brace EOB Lower Body Dressing: Moderate assistance Lower Body Dressing Details (indicate cue type and reason): pt simulated figure four method             Functional mobility during ADLs:  Contact guard assist;Rolling walker (2 wheels)       Vision Baseline Vision/History: 1 Wears glasses Ability to See in Adequate Light: 0 Adequate Patient Visual Report: No change from baseline       Perception         Praxis         Pertinent Vitals/Pain Pain Assessment Pain Assessment: 0-10 Pain Score: 7  Pain Location: back Pain Descriptors / Indicators: Discomfort Pain Intervention(s): Limited activity within patient's tolerance, Monitored during session     Extremity/Trunk Assessment Upper Extremity Assessment Upper Extremity Assessment: Overall WFL for tasks assessed   Lower Extremity Assessment Lower Extremity Assessment: Defer to PT evaluation   Cervical / Trunk Assessment Cervical / Trunk Assessment: Kyphotic   Communication Communication Communication: No apparent difficulties   Cognition Arousal: Alert Behavior During Therapy: WFL for tasks assessed/performed Cognition: No apparent impairments             OT - Cognition Comments: demo's good understanding of back precautions                 Following commands: Intact       Cueing  General Comments   Cueing Techniques: Verbal cues;Gestural cues  VSS on RA   Exercises     Shoulder Instructions      Home Living Family/patient expects to be discharged to:: Private residence Living Arrangements: Children;Other relatives (grandchildren who are older) Available Help at Discharge: Family;Available 24 hours/day Type of Home: House Home Access: Stairs to enter [employer] of Steps: 5 Entrance Stairs-Rails: Right;Left Home Layout: One level     Bathroom Shower/Tub: Chief Strategy Officer: Handicapped height Bathroom Accessibility: Yes   Home Equipment: Agricultural Consultant (2 wheels);Cane - single point;Educational Psychologist (4 wheels)   Additional Comments: lives with daughter and her 4 children      Prior Functioning/Environment Prior Level of Function :  Independent/Modified Independent             Mobility Comments: uses cane vs RW for community mobility, no AD in the house ADLs Comments: family cooks, cleans, and provides transportation; pt does his own laundry    OT Problem List: Decreased activity tolerance;Impaired balance (sitting and/or standing);Pain   OT Treatment/Interventions: Self-care/ADL training;DME and/or AE instruction;Therapeutic activities;Patient/family education;Balance training      OT Goals(Current goals can be found in the care plan section)   Acute Rehab OT Goals Patient Stated Goal: get better and stop breaking my bones OT Goal Formulation: With patient Time For Goal Achievement: 09/08/24 Potential to Achieve Goals: Good   OT Frequency:  Min 2X/week    Co-evaluation              AM-PAC OT 6 Clicks Daily Activity     Outcome Measure Help from another person eating meals?: None Help from another person taking care of personal grooming?: A Little Help from another person toileting, which includes using toliet, bedpan, or urinal?: A Little Help from another person bathing (including washing, rinsing, drying)?: A Lot Help from another person to put on and taking off regular upper body clothing?: A Lot Help from another person to put on and taking off regular lower body clothing?: A Lot 6 Click Score: 16  End of Session Equipment Utilized During Treatment: Rolling walker (2 wheels);Back brace Nurse Communication: Mobility status  Activity Tolerance: Patient tolerated treatment well Patient left: in chair;with call bell/phone within reach;with chair alarm set  OT Visit Diagnosis: Unsteadiness on feet (R26.81);Other abnormalities of gait and mobility (R26.89);History of falling (Z91.81)                Time: 8385-8362 OT Time Calculation (min): 23 min Charges:  OT General Charges $OT Visit: 1 Visit OT Evaluation $OT Eval Moderate Complexity: 1 Mod  Dominick Zertuche M. [place], OTR/L [facility] Acute  Rehabilitation Services [phone] Secure Chat Preferred  Jisela Merlino 08/25/2024, 4:51 PM

## 2024-08-25 NOTE — Discharge Instructions (Addendum)
 Social Connections: Socially Isolated (08/24/2024)   Social Connection and Isolation Panel    Frequency of Communication with Friends and Family: More than three times a week    Frequency of Social Gatherings with Friends and Family: More than three times a week    Attends Religious Services: Never    Database Administrator or Organizations: No    Attends Banker Meetings: Never    Marital Status: Separated

## 2024-08-25 NOTE — Evaluation (Signed)
 Physical Therapy Evaluation Patient Details Name: Edward Mullen MRN: [id number] DOB: 13-Aug-1955 Today's Date: 08/25/2024  History of Present Illness  Pt is 69 yo presenting to [facility] on 1/12 due to back pain and cough following a fall. Pt was placed on 3L O2 via [place] in the ER due to 68% O2 sats. CT with findings of T9 vertebrae sub acute fractures and healing subacute fractures of L posterior 9th and 10th ribs. PMH includes: CKD III, CHF, polysubstance abuse, BPH, vtach, arrest requiring ICD placement, CAD s/p stent placement.  Clinical Impression  Pt currently presenting at Mod I for bed mobility and sit to stand with RW. Pt is CGA for gait with RW with minimal UE support on RW. Pt has assistance at home 24/7 from family at home. Due to pt current functional status, home set up and available assistance at home recommending skilled physical therapy services 3x/week in order to address strength, balance and functional mobility to decrease risk for falls, injury and re-hospitalization.           If plan is discharge home, recommend the following: A little help with walking and/or transfers;Help with stairs or ramp for entrance;Assistance with cooking/housework;Assist for transportation     Equipment Recommendations None recommended by PT     Functional Status Assessment Patient has had a recent decline in their functional status and demonstrates the ability to make significant improvements in function in a reasonable and predictable amount of time.     Precautions / Restrictions Precautions Precautions: Fall Recall of Precautions/Restrictions: Intact Required Braces or Orthoses: Spinal Brace Spinal Brace: Thoracolumbosacral orthotic Restrictions Weight Bearing Restrictions Per Provider Order: No      Mobility  Bed Mobility Overal bed mobility: Modified Independent     General bed mobility comments: HOB up    Transfers Overall transfer level: Modified independent Equipment used:  Rolling walker (2 wheels)     General transfer comment: Good hand placement.    Ambulation/Gait Ambulation/Gait assistance: Contact guard assist Gait Distance (Feet): 120 Feet Assistive device: Rolling walker (2 wheels) Gait Pattern/deviations: Step-through pattern, Decreased stride length Gait velocity: decreased Gait velocity interpretation: 1.31 - 2.62 ft/sec, indicative of limited community ambulator   General Gait Details: Minimal UE support on RW. Short step length. CGA for safety  Stairs Stairs:  (Pt declined states he only likes to go up steps at home.)         Balance Overall balance assessment: Needs assistance Sitting-balance support: No upper extremity supported, Feet supported Sitting balance-Leahy Scale: Good     Standing balance support: Bilateral upper extremity supported, During functional activity, Single extremity supported Standing balance-Leahy Scale: Fair Standing balance comment: no overt LOB, does need atleast single extremity UE support for balance. Pt able to stand at toilet with hands on walls for urinate and stand at sink to wash hands, leaning on sink for balance.       Pertinent Vitals/Pain Pain Assessment Pain Assessment: Faces Faces Pain Scale: Hurts little more Pain Location: Back Pain Descriptors / Indicators: Discomfort Pain Intervention(s): Monitored during session    Home Living Family/patient expects to be discharged to:: Private residence Living Arrangements: Children;Other relatives (grand children who are older) Available Help at Discharge: Family;Available 24 hours/day Type of Home: House Home Access: Stairs to enter Entrance Stairs-Rails: Doctor, General Practice of Steps: 5   Home Layout: One level Home Equipment: Agricultural Consultant (2 wheels);Cane - single point;Educational Psychologist (4 wheels) Additional Comments: lives with daughter    Prior Function  Prior Level of Function : Independent/Modified Independent              Mobility Comments: using cane or rolling walker for ambulation.  Occasional assist for sit to stand from lower surfaces. ADLs Comments: dtr assists as needed, family cooks for him and provide transportation     Extremity/Trunk Assessment   Upper Extremity Assessment Upper Extremity Assessment: Overall WFL for tasks assessed;Defer to OT evaluation    Lower Extremity Assessment Lower Extremity Assessment: Overall WFL for tasks assessed    Cervical / Trunk Assessment Cervical / Trunk Assessment: Kyphotic  Communication   Communication Communication: No apparent difficulties    Cognition Arousal: Alert Behavior During Therapy: WFL for tasks assessed/performed   PT - Cognitive impairments: No apparent impairments       Following commands: Intact       Cueing Cueing Techniques: Verbal cues     General Comments General comments (skin integrity, edema, etc.): vital signs stable on room air during activity        Assessment/Plan    PT Assessment Patient needs continued PT services  PT Problem List Decreased strength;Decreased activity tolerance;Decreased balance;Decreased mobility;Pain       PT Treatment Interventions DME instruction;Therapeutic exercise;Gait training;Balance training;Stair training;Functional mobility training;Therapeutic activities;Patient/family education    PT Goals (Current goals can be found in the Care Plan section)  Acute Rehab PT Goals Patient Stated Goal: Go home and improve pain PT Goal Formulation: With patient Time For Goal Achievement: 09/08/24 Potential to Achieve Goals: Good    Frequency Min 2X/week        AM-PAC PT 6 Clicks Mobility  Outcome Measure Help needed turning from your back to your side while in a flat bed without using bedrails?: None Help needed moving from lying on your back to sitting on the side of a flat bed without using bedrails?: None Help needed moving to and from a bed to a chair (including  a wheelchair)?: A Little Help needed standing up from a chair using your arms (e.g., wheelchair or bedside chair)?: A Little Help needed to walk in hospital room?: A Little Help needed climbing 3-5 steps with a railing? : A Little 6 Click Score: 20    End of Session Equipment Utilized During Treatment: Gait belt;Back brace Activity Tolerance: Patient tolerated treatment well Patient left: in bed;with call bell/phone within reach;with bed alarm set Nurse Communication: Mobility status PT Visit Diagnosis: Unsteadiness on feet (R26.81);Other abnormalities of gait and mobility (R26.89)    Time: 8747-8693 PT Time Calculation (min) (ACUTE ONLY): 14 min   Charges:   PT Evaluation $PT Eval Low Complexity: 1 Low   PT General Charges $$ ACUTE PT VISIT: 1 Visit         Dorothyann Maier, DPT, CLT  Acute Rehabilitation Services Office: [phone] (Secure chat preferred)   Dorothyann VEAR Maier 08/25/2024, 1:15 PM

## 2024-08-25 NOTE — Hospital Course (Addendum)
 Brief Narrative:  69 year old with history of CHF EF 35%, V. tach cardiac arrest status post ICD, CAD status post PCI, polysubstance abuse admitted for back pain and cough.  Recently has been feeling very weak and has had occasional falls.  In the ER noted to be hypoxic saturating 68% on room air.  CTA CAP showed bilateral ground glass opacity, pulmonary emphysema, subacute T9 and left-sided rib fractures, BPH.  Admitted to the hospital for pain control and concerns of community-acquired pneumonia, COPD exacerbation.  He was also noted to have chronic left-sided rib fracture.  Pain was controlled, started on antibiotics and steroids.  Over the course of 3 days he started improving in the hospital.  Medically stable for discharge.   Assessment & Plan:   Acute respiratory failure with hypoxia secondary to community-acquired pneumonia Acute COPD exacerbation -Hypoxia is multifactorial with combination of community-acquired pneumonia, COPD exacerbation and likely atelectasis from rib fractures. - Continue Rocephin , bronchodilators scheduled and as needed, I-S/flutter valve - Mucinex  twice daily - Prednisone  40 mg X5 days, complete total 5-day course  Chronic kidney disease stage IIIb Creatinine around baseline 1.8-2.0.  Creatinine stable will resume home medications including torsemide  and Aldactone   Constipation Bowel regimen  History of left posterior rib fractures T9 fracture Multiple falls Prior to arrival.  Likely from multiple recent falls.  For now we will proceed with pain control.  He can use TLSO brace especially while ambulating. -PT/OT - HH, f64f completed.  TSH is normal  Vit D Def -Vit D Supplements.    Chronic congestive heart failure with reduced EF, 35% Echocardiogram in June 2025 shows EF of 35%.  No obvious signs of volume overload. - Continue home medications   History of VT S/p AICD - Continue  amiodarone  200 mg daily and mexiletine 150 mg BID   Controlled  Diabetes mellitus type 2, without long-term use of insulin  Last hemoglobin A1c noted to be 5.6 when checked on 06/16/2024. - Continue Jardiance     Thrombocytopenia Chronic.  Platelet count noted to be 109 which appears similar to priors. - Continue to monitor   AAA CT scan revealed dilation of the ascending thoracic aorta measuring approximately 4.3 cm - Recommend outpatient surveillance   Polysubstance abuse Patient reports continued use of marijuana, but reports not recently using cocaine  in the last month. - continue to counsel need of cessation of substance use     DVT prophylaxis: enoxaparin  (LOVENOX ) injection 40 mg Start: 08/24/24 1000    Code Status: Full Code Family Communication:   Status is: Inpatient Remains inpatient appropriate because: Discharge  PT Follow up Recs: Home Health Pt1/14/2026 1314, FTF completed  Subjective: Overall feels better  Examination:  General exam: Appears calm and comfortable  Respiratory system: Clear to auscultation. Respiratory effort normal. Cardiovascular system: S1 & S2 heard, RRR. No JVD, murmurs, rubs, gallops or clicks. No pedal edema. Gastrointestinal system: Abdomen is nondistended, soft and nontender. No organomegaly or masses felt. Normal bowel sounds heard. Central nervous system: Alert and oriented. No focal neurological deficits. Extremities: Symmetric 5 x 5 power. Skin: No rashes, lesions or ulcers Psychiatry: Judgement and insight appear normal. Mood & affect appropriate.

## 2024-08-25 NOTE — Progress Notes (Addendum)
 Transition of Care [facility]) - Inpatient Brief Assessment   Patient Details  Name: Edward Mullen MRN: [id number] Date of Birth: April 01, 1956  [facility] CM/SW Contact:    Rosaline JONELLE Joe, RN Phone Number: 08/25/2024, 4:19 PM   Clinical Narrative: Patient admitted to the hospital with back pain, cough and fall at home resulting in a T9 back fracture.  The patient lives with the daughter and granddaughter at the home.  When patient admitted to the hospital - his oxygen was 68%.  Patient is currently on RA.  Patient is a current smoker with marijuana and smokes it every other day.  Resources provided in the AVS.  The patient states that he quit smoking cigarettes.  Patient denies ETOH and any other drug use.  PCP - [employer].  DME at the home includes RW, [place], Shower seat, Rolator, and nebulizer.    Patient was seen by PT and OT and recommended for home health services.  Patient faxed in the hub and patient chose [facility].  HH orders for PT/OT placed to be co-signed by MD.  Patient's family will likely provide transportation to home when stable.  Patient lives with family - resource provided in the AVS.    Resources for social connections added to AVS.   Transition of Care Asessment: Insurance and Status: (P) Insurance coverage has been reviewed Patient has primary care physician: (P) Yes Home environment has been reviewed: (P) from home with daughter, granddaughter Prior level of function:: (P) self Prior/Current Home Services: (P) No current home services Social Drivers of Health Review: (P) SDOH reviewed interventions complete Readmission risk has been reviewed: (P) Yes Transition of care needs: (P) transition of care needs identified, TOC will continue to follow

## 2024-08-26 DIAGNOSIS — J9601 Acute respiratory failure with hypoxia: Secondary | ICD-10-CM | POA: Diagnosis not present

## 2024-08-26 LAB — BASIC METABOLIC PANEL WITH GFR
Anion gap: 12 (ref 5–15)
BUN: 44 mg/dL — ABNORMAL HIGH (ref 8–23)
CO2: 22 mmol/L (ref 22–32)
Calcium: 9 mg/dL (ref 8.9–10.3)
Chloride: 97 mmol/L — ABNORMAL LOW (ref 98–111)
Creatinine, Ser: 2.04 mg/dL — ABNORMAL HIGH (ref 0.61–1.24)
GFR, Estimated: 34 mL/min — ABNORMAL LOW
Glucose, Bld: 107 mg/dL — ABNORMAL HIGH (ref 70–99)
Potassium: 4.9 mmol/L (ref 3.5–5.1)
Sodium: 131 mmol/L — ABNORMAL LOW (ref 135–145)

## 2024-08-26 LAB — CBC
HCT: 39.7 % (ref 39.0–52.0)
Hemoglobin: 12.9 g/dL — ABNORMAL LOW (ref 13.0–17.0)
MCH: 28.2 pg (ref 26.0–34.0)
MCHC: 32.5 g/dL (ref 30.0–36.0)
MCV: 86.7 fL (ref 80.0–100.0)
Platelets: 118 K/uL — ABNORMAL LOW (ref 150–400)
RBC: 4.58 MIL/uL (ref 4.22–5.81)
RDW: 13.9 % (ref 11.5–15.5)
WBC: 6.3 K/uL (ref 4.0–10.5)
nRBC: 0 % (ref 0.0–0.2)

## 2024-08-26 LAB — MAGNESIUM: Magnesium: 2.4 mg/dL (ref 1.7–2.4)

## 2024-08-26 LAB — GLUCOSE, CAPILLARY
Glucose-Capillary: 116 mg/dL — ABNORMAL HIGH (ref 70–99)
Glucose-Capillary: 112 mg/dL — ABNORMAL HIGH (ref 70–99)
Glucose-Capillary: 114 mg/dL — ABNORMAL HIGH (ref 70–99)
Glucose-Capillary: 124 mg/dL — ABNORMAL HIGH (ref 70–99)

## 2024-08-26 LAB — PHOSPHORUS: Phosphorus: 3.7 mg/dL (ref 2.5–4.6)

## 2024-08-26 MED ORDER — SODIUM CHLORIDE 0.9 % IV SOLN: INTRAVENOUS | Status: AC

## 2024-08-26 NOTE — Progress Notes (Signed)
 " PROGRESS NOTE    Edward Mullen  FMW:[id number] DOB: 24-Dec-1955 DOA: 08/23/2024 PCP: Leontine Cramp, NP    Brief Narrative:  69 year old with history of CHF EF 35%, V. tach cardiac arrest status post ICD, CAD status post PCI, polysubstance abuse admitted for back pain and cough.  Recently has been feeling very weak and has had occasional falls.  In the ER noted to be hypoxic saturating 68% on room air.  CTA CAP showed bilateral ground glass opacity, pulmonary emphysema, subacute T9 and left-sided rib fractures, BPH.  Admitted to the hospital for pain control and concerns of community-acquired pneumonia.   Assessment & Plan:   Acute respiratory failure with hypoxia secondary to community-acquired pneumonia Acute COPD exacerbation -Hypoxia is multifactorial with combination of community-acquired pneumonia, COPD exacerbation and likely atelectasis from rib fractures. - Continue Rocephin , bronchodilators scheduled and as needed, I-S/flutter valve - Mucinex  twice daily - Prednisone  40 mg X5 days  Chronic kidney disease stage IIIb Creatinine around baseline 1.8-2.0. slightly jump in Cr. Will hold Torsemide  and Aldactone  today   Constipation Bowel regimen  History of left posterior rib fractures T9 fracture Multiple falls Prior to arrival.  Likely from multiple recent falls.  For now we will proceed with pain control.  He can use TLSO brace especially while ambulating. -PT/OT - HH, f35f completed.  TSH is normal  Vit D Def -Vit D Supplements.    Chronic congestive heart failure with reduced EF, 35% Echocardiogram in June 2025 shows EF of 35%.  No obvious signs of volume overload. - Continue Coreg .  Holding nephrotoxic drugs while getting gentle diuresis today   History of VT S/p AICD - Continue  amiodarone  200 mg daily and mexiletine 150 mg BID   Controlled Diabetes mellitus type 2, without long-term use of insulin  Last hemoglobin A1c noted to be 5.6 when checked on  06/16/2024. - Continue Jardiance  -Sliding scale and Accu-Cheks      Thrombocytopenia Chronic.  Platelet count noted to be 109 which appears similar to priors. - Continue to monitor   AAA CT scan revealed dilation of the ascending thoracic aorta measuring approximately 4.3 cm - Recommend outpatient surveillance   Polysubstance abuse Patient reports continued use of marijuana, but reports not recently using cocaine  in the last month. - continue to counsel need of cessation of substance use     DVT prophylaxis: enoxaparin  (LOVENOX ) injection 40 mg Start: 08/24/24 1000    Code Status: Full Code Family Communication:   Status is: Inpatient Remains inpatient appropriate because: Discharge tomorrow if renal function is improved   PT Follow up Recs: Home Health Pt1/14/2026 1314, FTF completed  Subjective: Still having chest discomfort from rib fracture  Examination:  General exam: Appears calm and comfortable  Respiratory system: Clear to auscultation. Respiratory effort normal. Cardiovascular system: S1 & S2 heard, RRR. No JVD, murmurs, rubs, gallops or clicks. No pedal edema. Gastrointestinal system: Abdomen is nondistended, soft and nontender. No organomegaly or masses felt. Normal bowel sounds heard. Central nervous system: Alert and oriented. No focal neurological deficits. Extremities: Symmetric 5 x 5 power. Skin: No rashes, lesions or ulcers Psychiatry: Judgement and insight appear normal. Mood & affect appropriate.                Diet Orders (From admission, onward)     Start     Ordered   08/24/24 0845  Diet Heart Room service appropriate? Yes; Fluid consistency: Thin  Diet effective now       Question  Answer Comment  Room service appropriate? Yes   Fluid consistency: Thin      08/24/24 0844            Objective: Vitals:   08/26/24 0439 08/26/24 0500 08/26/24 0744 08/26/24 0759  BP: 133/80   131/71  Pulse: 62  65 62  Resp: 18  16 19   Temp:  97.8 F (36.6 C)   97.8 F (36.6 C)  TempSrc:      SpO2: 99%  98% 98%  Weight:  101.6 kg      Intake/Output Summary (Last 24 hours) at 08/26/2024 1136 Last data filed at 08/26/2024 0926 Gross per 24 hour  Intake 440.11 ml  Output 450 ml  Net -9.89 ml   Filed Weights   08/25/24 0500 08/26/24 0500  Weight: 100.4 kg 101.6 kg    Scheduled Meds:  acetaminophen   1,000 mg Oral TID   amiodarone   200 mg Oral Daily   arformoterol   15 mcg Nebulization BID   And   umeclidinium bromide   1 puff Inhalation Daily   aspirin  EC  81 mg Oral Daily   carvedilol   3.125 mg Oral BID PC   Chlorhexidine  Gluconate Cloth  6 each Topical Daily   cholecalciferol   1,000 Units Oral Daily   empagliflozin   10 mg Oral Daily   enoxaparin  (LOVENOX ) injection  40 mg Subcutaneous Q24H   gabapentin   600 mg Oral TID   guaiFENesin   600 mg Oral BID   insulin  aspart  0-5 Units Subcutaneous QHS   insulin  aspart  0-9 Units Subcutaneous TID WC   lidocaine   2 patch Transdermal Q24H   mexiletine  150 mg Oral Q12H   mupirocin  ointment  1 Application Nasal BID   nortriptyline   75 mg Oral QHS   polyethylene glycol  17 g Oral BID   predniSONE   40 mg Oral Q breakfast   rosuvastatin   40 mg Oral QHS   senna-docusate  1 tablet Oral BID   tamsulosin   0.4 mg Oral QHS   Continuous Infusions:  sodium chloride      cefTRIAXone  (ROCEPHIN )  IV Stopped (08/25/24 2300)    Nutritional status     Body mass index is 30.38 kg/m.  Data Reviewed:   CBC: Recent Labs  Lab 08/24/24 0030 08/25/24 0240 08/26/24 0250  WBC 5.2 4.0 6.3  HGB 15.2 14.4 12.9*  HCT 47.5 45.0 39.7  MCV 89.8 89.3 86.7  PLT 109* 113* 118*   Basic Metabolic Panel: Recent Labs  Lab 08/24/24 0030 08/25/24 0240 08/26/24 0250  NA 136 135 131*  K 4.8 5.7* 4.9  CL 102 101 97*  CO2 23 23 22   GLUCOSE 103* 88 107*  BUN 28* 32* 44*  CREATININE 1.75* 1.75* 2.04*  CALCIUM  9.5 9.5 9.0  MG  --   --  2.4  PHOS  --   --  3.7   GFR: Estimated  Creatinine Clearance: 41.6 mL/min (A) (by C-G formula based on SCr of 2.04 mg/dL (H)). Liver Function Tests: Recent Labs  Lab 08/24/24 0030 08/25/24 0240  AST 38 35  ALT 15 13  ALKPHOS 143* 145*  BILITOT 0.7 0.5  PROT 8.1 8.0  ALBUMIN  3.9 3.8   No results for input(s): LIPASE, AMYLASE in the last 168 hours. No results for input(s): AMMONIA in the last 168 hours. Coagulation Profile: No results for input(s): INR, PROTIME in the last 168 hours. Cardiac Enzymes: No results for input(s): CKTOTAL, CKMB, CKMBINDEX, TROPONINI in the last 168 hours. BNP (last  3 results) No results for input(s): PROBNP in the last 8760 hours. HbA1C: No results for input(s): HGBA1C in the last 72 hours. CBG: Recent Labs  Lab 08/25/24 1649 08/25/24 2214 08/26/24 0812  GLUCAP 105* 124* 116*   Lipid Profile: No results for input(s): CHOL, HDL, LDLCALC, TRIG, CHOLHDL, LDLDIRECT in the last 72 hours. Thyroid  Function Tests: Recent Labs    08/25/24 1013  TSH 1.030   Anemia Panel: No results for input(s): VITAMINB12, FOLATE, FERRITIN, TIBC, IRON, RETICCTPCT in the last 72 hours. Sepsis Labs: Recent Labs  Lab 08/24/24 0030  PROCALCITON 0.11    Recent Results (from the past 240 hours)  Blood culture (routine x 2)     Status: None (Preliminary result)   Collection Time: 08/24/24  6:39 AM   Specimen: BLOOD RIGHT HAND  Result Value Ref Range Status   Specimen Description BLOOD RIGHT HAND  Final   Special Requests   Final    BOTTLES DRAWN AEROBIC AND ANAEROBIC Blood Culture adequate volume   Culture   Final    NO GROWTH 2 DAYS Performed at [facility], [address], [place], [place] [address]    Report Status PENDING  Incomplete  Blood culture (routine x 2)     Status: None (Preliminary result)   Collection Time: 08/24/24  6:48 AM   Specimen: BLOOD LEFT HAND  Result Value Ref Range Status   Specimen Description BLOOD LEFT HAND  Final    Special Requests   Final    BOTTLES DRAWN AEROBIC AND ANAEROBIC Blood Culture adequate volume   Culture   Final    NO GROWTH 2 DAYS Performed at [facility], [address], [place], [place] [address]    Report Status PENDING  Incomplete  MRSA Next Gen by PCR, Nasal     Status: Abnormal   Collection Time: 08/24/24  8:02 PM   Specimen: Nasal Mucosa; Nasal Swab  Result Value Ref Range Status   MRSA by PCR Next Gen DETECTED (A) NOT DETECTED Final    Comment: RESULTS CALLED TO, READ BACK BY AND VERIFIED WITH: RN D.[email or web address] ON 08/25/24 AT 0017 BY NM (NOTE) The GeneXpert MRSA Assay (FDA approved for NASAL specimens only), is one component of a comprehensive MRSA colonization surveillance program. It is not intended to diagnose MRSA infection nor to guide or monitor treatment for MRSA infections. Test performance is not FDA approved in patients less than 18 years old. Performed at [facility], [address], [place], [place] [address]          Radiology Studies: No results found.         LOS: 2 days   Time spent= 35 mins    Burgess JAYSON Dare, MD [facility]  If 7PM-7AM, please contact night-coverage  08/26/2024, 11:36 AM  "

## 2024-08-26 NOTE — Plan of Care (Signed)
  Problem: Activity: Goal: Ability to tolerate increased activity will improve Outcome: Progressing   Problem: Clinical Measurements: Goal: Ability to maintain a body temperature in the normal range will improve Outcome: Progressing   Problem: Respiratory: Goal: Ability to maintain adequate ventilation will improve Outcome: Progressing Goal: Ability to maintain a clear airway will improve Outcome: Progressing   Problem: Education: Goal: Knowledge of General Education information will improve Description: Including pain rating scale, medication(s)/side effects and non-pharmacologic comfort measures Outcome: Progressing   Problem: Health Behavior/Discharge Planning: Goal: Ability to manage health-related needs will improve Outcome: Progressing   Problem: Clinical Measurements: Goal: Ability to maintain clinical measurements within normal limits will improve Outcome: Progressing Goal: Will remain free from infection Outcome: Progressing Goal: Diagnostic test results will improve Outcome: Progressing Goal: Respiratory complications will improve Outcome: Progressing Goal: Cardiovascular complication will be avoided Outcome: Progressing   Problem: Activity: Goal: Risk for activity intolerance will decrease Outcome: Progressing   Problem: Nutrition: Goal: Adequate nutrition will be maintained Outcome: Progressing   Problem: Coping: Goal: Level of anxiety will decrease Outcome: Progressing   Problem: Elimination: Goal: Will not experience complications related to bowel motility Outcome: Progressing Goal: Will not experience complications related to urinary retention Outcome: Progressing   Problem: Pain Managment: Goal: General experience of comfort will improve and/or be controlled Outcome: Progressing   Problem: Safety: Goal: Ability to remain free from injury will improve Outcome: Progressing   Problem: Skin Integrity: Goal: Risk for impaired skin integrity  will decrease Outcome: Progressing   Problem: Education: Goal: Ability to describe self-care measures that may prevent or decrease complications (Diabetes Survival Skills Education) will improve Outcome: Progressing Goal: Individualized Educational Video(s) Outcome: Progressing   Problem: Coping: Goal: Ability to adjust to condition or change in health will improve Outcome: Progressing   Problem: Fluid Volume: Goal: Ability to maintain a balanced intake and output will improve Outcome: Progressing   Problem: Health Behavior/Discharge Planning: Goal: Ability to identify and utilize available resources and services will improve Outcome: Progressing Goal: Ability to manage health-related needs will improve Outcome: Progressing   Problem: Metabolic: Goal: Ability to maintain appropriate glucose levels will improve Outcome: Progressing   Problem: Nutritional: Goal: Maintenance of adequate nutrition will improve Outcome: Progressing Goal: Progress toward achieving an optimal weight will improve Outcome: Progressing   Problem: Skin Integrity: Goal: Risk for impaired skin integrity will decrease Outcome: Progressing   Problem: Tissue Perfusion: Goal: Adequacy of tissue perfusion will improve Outcome: Progressing

## 2024-08-26 NOTE — Plan of Care (Signed)
  Problem: Clinical Measurements: Goal: Ability to maintain clinical measurements within normal limits will improve Outcome: Progressing Goal: Will remain free from infection Outcome: Progressing Goal: Diagnostic test results will improve Outcome: Progressing Goal: Respiratory complications will improve Outcome: Progressing Goal: Cardiovascular complication will be avoided Outcome: Progressing   Problem: Activity: Goal: Risk for activity intolerance will decrease Outcome: Progressing   Problem: Nutrition: Goal: Adequate nutrition will be maintained Outcome: Progressing   Problem: Elimination: Goal: Will not experience complications related to bowel motility Outcome: Progressing Goal: Will not experience complications related to urinary retention Outcome: Progressing

## 2024-08-27 ENCOUNTER — Other Ambulatory Visit (HOSPITAL_COMMUNITY): Payer: Self-pay

## 2024-08-27 LAB — BASIC METABOLIC PANEL WITH GFR
Anion gap: 12 (ref 5–15)
BUN: 43 mg/dL — ABNORMAL HIGH (ref 8–23)
CO2: 22 mmol/L (ref 22–32)
Calcium: 9.1 mg/dL (ref 8.9–10.3)
Chloride: 99 mmol/L (ref 98–111)
Creatinine, Ser: 1.71 mg/dL — ABNORMAL HIGH (ref 0.61–1.24)
GFR, Estimated: 43 mL/min — ABNORMAL LOW
Glucose, Bld: 103 mg/dL — ABNORMAL HIGH (ref 70–99)
Potassium: 4.6 mmol/L (ref 3.5–5.1)
Sodium: 133 mmol/L — ABNORMAL LOW (ref 135–145)

## 2024-08-27 LAB — LEGIONELLA PNEUMOPHILA SEROGP 1 UR AG: L. pneumophila Serogp 1 Ur Ag: NEGATIVE

## 2024-08-27 LAB — GLUCOSE, CAPILLARY
Glucose-Capillary: 97 mg/dL (ref 70–99)
Glucose-Capillary: 117 mg/dL — ABNORMAL HIGH (ref 70–99)

## 2024-08-27 MED ORDER — SENNOSIDES-DOCUSATE SODIUM 8.6-50 MG PO TABS
2.0000 | ORAL_TABLET | Freq: Every evening | ORAL | 0 refills | Status: AC | PRN
  Filled 2024-08-27: qty 60, 30d supply, fill #0

## 2024-08-27 MED ORDER — PREDNISONE 20 MG PO TABS
40.0000 mg | ORAL_TABLET | Freq: Every day | ORAL | 0 refills | Status: AC
  Filled 2024-08-27: qty 6, 3d supply, fill #0

## 2024-08-27 MED ORDER — LIDOCAINE 4 % EX PTCH: 1.0000 | MEDICATED_PATCH | CUTANEOUS | Status: AC

## 2024-08-27 MED ORDER — ACETAMINOPHEN 500 MG PO TABS: 1000.0000 mg | ORAL_TABLET | Freq: Three times a day (TID) | ORAL | Status: AC

## 2024-08-27 NOTE — Progress Notes (Signed)
 Physical Therapy Treatment Patient Details Name: Edward Mullen MRN: [id number] DOB: 24-Dec-1955 Today's Date: 08/27/2024   History of Present Illness Pt is 69 yo presenting [facility] on 1/12 due to back pain and cough following a fall. Pt was placed on 3L O2 via [place] in the ER due to 68% O2 sats. CT with findings of T9 vertebrae sub acute fractures and healing subacute fractures of L posterior 9th and 10th ribs. PMH includes: CKD III, CHF, polysubstance abuse, BPH, vtach, arrest requiring ICD placement, CAD s/p stent placement.    PT Comments  Pt received in the recliner and agreeable to session. Pt able to perform all mobility tasks with CGA for safety and cues to maintain back precautions functionally. Pt reports increased pain with back extension and maintains flexed trunk throughout. Pt able to increase gait distance, but is limited by pain and fatigue. Pt continues to benefit from PT services to progress toward functional mobility goals.    If plan is discharge home, recommend the following: A little help with walking and/or transfers;Help with stairs or ramp for entrance;Assistance with cooking/housework;Assist for transportation   Can travel by private vehicle        Equipment Recommendations  None recommended by PT    Recommendations for Other Services       Precautions / Restrictions Precautions Precautions: Fall;Back Recall of Precautions/Restrictions: Intact Precaution/Restrictions Comments: able to recall 3/3 precautions Required Braces or Orthoses: Spinal Brace Spinal Brace: Thoracolumbosacral orthotic Restrictions Weight Bearing Restrictions Per Provider Order: No     Mobility  Bed Mobility Overal bed mobility: Needs Assistance Bed Mobility: Rolling, Sit to Sidelying Rolling: Supervision       Sit to sidelying: Contact guard assist General bed mobility comments: cues for logroll technique to maintain back precautions.    Transfers Overall transfer level:  Needs assistance Equipment used: Rolling walker (2 wheels) Transfers: Sit to/from Stand Sit to Stand: Contact guard assist           General transfer comment: From recliner with cues for hand placement and increased time to rise    Ambulation/Gait Ambulation/Gait assistance: Contact guard assist Gait Distance (Feet): 150 Feet Assistive device: Rolling walker (2 wheels) Gait Pattern/deviations: Step-through pattern, Decreased stride length, Trunk flexed       General Gait Details: Pt demonstrates slight trunk flexion throughout with increased pain with extension. DOE noted, but pt relates it to pain.   Stairs             Wheelchair Mobility     Tilt Bed    Modified Rankin (Stroke Patients Only)       Balance Overall balance assessment: Needs assistance Sitting-balance support: No upper extremity supported, Feet supported Sitting balance-Leahy Scale: Good     Standing balance support: Bilateral upper extremity supported, During functional activity, Reliant on assistive device for balance Standing balance-Leahy Scale: Poor Standing balance comment: reliant on RW dynamically                            Communication Communication Communication: No apparent difficulties  Cognition Arousal: Alert Behavior During Therapy: WFL for tasks assessed/performed   PT - Cognitive impairments: No apparent impairments                         Following commands: Intact      Cueing Cueing Techniques: Verbal cues, Gestural cues  Exercises      General  Comments General comments (skin integrity, edema, etc.): VSS      Pertinent Vitals/Pain Pain Assessment Pain Assessment: 0-10 Pain Score: 8  Pain Location: back Pain Descriptors / Indicators: Discomfort, Aching Pain Intervention(s): Limited activity within patient's tolerance, Monitored during session, Repositioned     PT Goals (current goals can now be found in the care plan section)  Acute Rehab PT Goals Patient Stated Goal: Go home and improve pain PT Goal Formulation: With patient Time For Goal Achievement: 09/08/24 Progress towards PT goals: Progressing toward goals    Frequency    Min 2X/week       AM-PAC PT 6 Clicks Mobility   Outcome Measure  Help needed turning from your back to your side while in a flat bed without using bedrails?: None Help needed moving from lying on your back to sitting on the side of a flat bed without using bedrails?: A Little Help needed moving to and from a bed to a chair (including a wheelchair)?: A Little Help needed standing up from a chair using your arms (e.g., wheelchair or bedside chair)?: A Little Help needed to walk in hospital room?: A Little Help needed climbing 3-5 steps with a railing? : A Little 6 Click Score: 19    End of Session Equipment Utilized During Treatment: Gait belt;Back brace Activity Tolerance: Patient tolerated treatment well;Patient limited by pain Patient left: in bed;with call bell/phone within reach;with bed alarm set Nurse Communication: Mobility status PT Visit Diagnosis: Unsteadiness on feet (R26.81);Other abnormalities of gait and mobility (R26.89)     Time: 9091-9074 PT Time Calculation (min) (ACUTE ONLY): 17 min  Charges:    $Gait Training: 8-22 mins PT General Charges $$ ACUTE PT VISIT: 1 Visit                    Darryle George, PTA Acute Rehabilitation Services Secure Chat Preferred  [phone]    Darryle George 08/27/2024, 10:39 AM

## 2024-08-27 NOTE — Care Management Important Message (Signed)
 Important Message  Patient Details  Name: Edward Mullen MRN: [id number] Date of Birth: 02-Apr-1956   Important Message Given:  Yes - Medicare IM     Claretta Deed 08/27/2024, 2:44 PM

## 2024-08-27 NOTE — Plan of Care (Signed)
" °  Problem: Activity: Goal: Ability to tolerate increased activity will improve Outcome: Progressing   Problem: Clinical Measurements: Goal: Ability to maintain a body temperature in the normal range will improve Outcome: Progressing   Problem: Respiratory: Goal: Ability to maintain adequate ventilation will improve Outcome: Progressing Goal: Ability to maintain a clear airway will improve Outcome: Progressing   Problem: Education: Goal: Knowledge of General Education information will improve Description: Including pain rating scale, medication(s)/side effects and non-pharmacologic comfort measures Outcome: Progressing   Problem: Health Behavior/Discharge Planning: Goal: Ability to manage health-related needs will improve Outcome: Progressing   Problem: Clinical Measurements: Goal: Ability to maintain clinical measurements within normal limits will improve Outcome: Progressing Goal: Will remain free from infection Outcome: Progressing Goal: Diagnostic test results will improve Outcome: Progressing Goal: Respiratory complications will improve Outcome: Progressing Goal: Cardiovascular complication will be avoided Outcome: Progressing   Problem: Coping: Goal: Level of anxiety will decrease Outcome: Progressing   Problem: Elimination: Goal: Will not experience complications related to bowel motility Outcome: Progressing Goal: Will not experience complications related to urinary retention Outcome: Progressing   Problem: Pain Managment: Goal: General experience of comfort will improve and/or be controlled Outcome: Progressing   Problem: Skin Integrity: Goal: Risk for impaired skin integrity will decrease Outcome: Progressing   Problem: Coping: Goal: Ability to adjust to condition or change in health will improve Outcome: Progressing   Problem: Metabolic: Goal: Ability to maintain appropriate glucose levels will improve Outcome: Progressing   Problem:  Nutritional: Goal: Maintenance of adequate nutrition will improve Outcome: Progressing   Problem: Tissue Perfusion: Goal: Adequacy of tissue perfusion will improve Outcome: Progressing   "

## 2024-08-27 NOTE — Discharge Summary (Addendum)
 Physician Discharge Summary  Edward Mullen FMW:[id number] DOB: 1956-01-20 DOA: 08/23/2024  PCP: Leontine Cramp, NP  Admit date: 08/23/2024 Discharge date: 08/27/2024  Admitted From: Home Disposition: Home with home health  Recommendations for Outpatient Follow-up:  Follow up with PCP in 1-2 weeks Please obtain BMP/CBC in one week your next doctors visit.  Continue home cardiac medications Pain medication with bowel regimen prescribed.  Lidocaine  4% patches advised to be obtained over-the-counter   Discharge Condition: Stable CODE STATUS: Full code Diet recommendation: Heart healthy  Brief/Interim Summary: Brief Narrative:  69 year old with history of CHF EF 35%, V. tach cardiac arrest status post ICD, CAD status post PCI, polysubstance abuse admitted for back pain and cough.  Recently has been feeling very weak and has had occasional falls.  In the ER noted to be hypoxic saturating 68% on room air.  CTA CAP showed bilateral ground glass opacity, pulmonary emphysema, subacute T9 and left-sided rib fractures, BPH.  Admitted to the hospital for pain control and concerns of community-acquired pneumonia, COPD exacerbation.  He was also noted to have chronic left-sided rib fracture.  Pain was controlled, started on antibiotics and steroids.  Over the course of 3 days he started improving in the hospital.  Medically stable for discharge.   Assessment & Plan:   Acute respiratory failure with hypoxia secondary to community-acquired pneumonia Acute COPD exacerbation -Hypoxia is multifactorial with combination of community-acquired pneumonia, COPD exacerbation and likely atelectasis from rib fractures. - Continue Rocephin , bronchodilators scheduled and as needed, I-S/flutter valve - Mucinex  twice daily - Prednisone  40 mg X5 days, complete total 5-day course  Chronic kidney disease stage IIIb Creatinine around baseline 1.8-2.0.  Creatinine stable will resume home medications including  torsemide  and Aldactone   Constipation Bowel regimen  History of left posterior rib fractures T9 fracture Multiple falls Prior to arrival.  Likely from multiple recent falls.  For now we will proceed with pain control.  He can use TLSO brace especially while ambulating. -PT/OT - HH, f50f completed.  TSH is normal  Vit D Def -Vit D Supplements.    Chronic congestive heart failure with reduced EF, 35% Echocardiogram in June 2025 shows EF of 35%.  No obvious signs of volume overload. - Continue home medications   History of VT S/p AICD - Continue  amiodarone  200 mg daily and mexiletine 150 mg BID   Controlled Diabetes mellitus type 2, without long-term use of insulin  Last hemoglobin A1c noted to be 5.6 when checked on 06/16/2024. - Continue Jardiance     Thrombocytopenia Chronic.  Platelet count noted to be 109 which appears similar to priors. - Continue to monitor   AAA CT scan revealed dilation of the ascending thoracic aorta measuring approximately 4.3 cm - Recommend outpatient surveillance   Polysubstance abuse Patient reports continued use of marijuana, but reports not recently using cocaine  in the last month. - continue to counsel need of cessation of substance use     DVT prophylaxis: enoxaparin  (LOVENOX ) injection 40 mg Start: 08/24/24 1000    Code Status: Full Code Family Communication:   Status is: Inpatient Remains inpatient appropriate because: Discharge  PT Follow up Recs: Home Health Pt1/14/2026 1314, FTF completed  Subjective: Overall feels better  Examination:  General exam: Appears calm and comfortable  Respiratory system: Clear to auscultation. Respiratory effort normal. Cardiovascular system: S1 & S2 heard, RRR. No JVD, murmurs, rubs, gallops or clicks. No pedal edema. Gastrointestinal system: Abdomen is nondistended, soft and nontender. No organomegaly or masses felt. Normal bowel  sounds heard. Central nervous system: Alert and oriented. No focal  neurological deficits. Extremities: Symmetric 5 x 5 power. Skin: No rashes, lesions or ulcers Psychiatry: Judgement and insight appear normal. Mood & affect appropriate.    Discharge Diagnoses:  Principal Problem:   Acute hypoxemic respiratory failure due to Multifocal Pneumonia Active Problems:   CAP (community acquired pneumonia)   COPD with acute exacerbation (HCC)   T9 vertebral fracture (HCC)   History of rib fracture   Constipation   Chronic systolic heart failure (HCC)   History of ventricular tachycardia   ICD (implantable cardioverter-defibrillator) in place   Controlled type 2 diabetes mellitus without complication, without long-term current use of insulin  (HCC)   CKD (chronic kidney disease) stage 3, GFR 30-59 ml/min (HCC)   Thrombocytopenia   AAA (abdominal aortic aneurysm)   Polysubstance abuse (HCC)      Discharge Exam: Vitals:   08/27/24 0805 08/27/24 0911  BP: 123/72   Pulse: 68   Resp: 20   Temp: 98.1 F (36.7 C)   SpO2: 94% 100%   Vitals:   08/26/24 1931 08/27/24 0435 08/27/24 0805 08/27/24 0911  BP:  112/67 123/72   Pulse:  69 68   Resp:  18 20   Temp:  97.8 F (36.6 C) 98.1 F (36.7 C)   TempSrc:   Oral   SpO2: 97% 98% 94% 100%  Weight:  101 kg        Discharge Instructions   Allergies as of 08/27/2024   No Known Allergies      Medication List     TAKE these medications    acetaminophen  500 MG tablet Commonly known as: TYLENOL  Take 1,000 mg by mouth every 6 (six) hours as needed for mild pain (pain score 1-3) or moderate pain (pain score 4-6). What changed: Another medication with the same name was added. Make sure you understand how and when to take each.   acetaminophen  500 MG tablet Commonly known as: TYLENOL  Take 2 tablets (1,000 mg total) by mouth 3 (three) times daily for 7 days. What changed: You were already taking a medication with the same name, and this prescription was added. Make sure you understand how and when  to take each.   albuterol  108 (90 Base) MCG/ACT inhaler Commonly known as: VENTOLIN  HFA INHALE 1 TO 2 PUFFS BY MOUTH EVERY 6 HOURS AS NEEDED FOR WHEEZING OR SHORTNESS OF BREATH   amiodarone  200 MG tablet Commonly known as: PACERONE  Take 1 tablet (200 mg total) by mouth daily.   Aspirin  Low Dose 81 MG tablet Generic drug: aspirin  EC TAKE 1 TABLET (81 MG TOTAL) BY MOUTH DAILY. SWALLOW WHOLE(AM)   carvedilol  3.125 MG tablet Commonly known as: Coreg  Take 1 tablet (3.125 mg total) by mouth 2 (two) times daily.   gabapentin  600 MG tablet Commonly known as: NEURONTIN  Take 600 mg by mouth in the morning, at noon, and at bedtime.   HYDROcodone -acetaminophen  5-325 MG tablet Commonly known as: NORCO/VICODIN Take 1 tablet by mouth every 6 (six) hours as needed.   Jardiance  10 MG Tabs tablet Generic drug: empagliflozin  Take 10 mg by mouth daily.   lidocaine  4 % Place 1 patch onto the skin daily for 14 days.   losartan  25 MG tablet Commonly known as: COZAAR  Take 25 mg by mouth daily.   mexiletine 150 MG capsule Commonly known as: MEXITIL  TAKE 1 CAPSULE BY MOUTH 2 (TWO) TIMES DAILY (AM+BEDTIME)   nortriptyline  75 MG capsule Commonly known as: PAMELOR  Take  75 mg by mouth at bedtime.   polyethylene glycol powder 17 GM/SCOOP powder Commonly known as: GLYCOLAX /MIRALAX  Take 17 g by mouth 2 (two) times daily as needed for moderate constipation or severe constipation. Dissolve 1 capful (17g) in 4-8 ounces of liquid and take by mouth daily.   predniSONE  20 MG tablet Commonly known as: DELTASONE  Take 2 tablets (40 mg total) by mouth daily with breakfast for 3 days.   rosuvastatin  40 MG tablet Commonly known as: CRESTOR  Take 1 tablet (40 mg total) by mouth daily. What changed: when to take this   senna-docusate 8.6-50 MG tablet Commonly known as: Senokot-S Take 2 tablets by mouth at bedtime as needed for moderate constipation.   spironolactone  25 MG tablet Commonly known as:  ALDACTONE  Take 1 tablet (25 mg total) by mouth daily.   Stiolto Respimat 2.5-2.5 MCG/ACT Aers Generic drug: Tiotropium Bromide-Olodaterol Inhale 2 puffs into the lungs daily.   tamsulosin  0.4 MG Caps capsule Commonly known as: FLOMAX  Take 0.4 mg by mouth at bedtime.   torsemide  20 MG tablet Commonly known as: DEMADEX  TAKE 1 TABLET (20 MG TOTAL) BY MOUTH DAILY (AM)   Vitamin D3 Super Strength 50 MCG (2000 UT) Tabs Generic drug: Cholecalciferol  Take 2,000 Units by mouth daily.        Contact information for follow-up providers     Leontine Cramp, NP .   Specialty: Nurse Practitioner Contact information: [address] [place] [address] [phone]              Contact information for after-discharge care     Home Medical Care     [facility]) .   Service: Home Health Services Contact information: [address] [place]  [address] [phone]                    Allergies[1]  You were cared for by a hospitalist during your hospital stay. If you have any questions about your discharge medications or the care you received while you were in the hospital after you are discharged, you can call the unit and asked to speak with the hospitalist on call if the hospitalist that took care of you is not available. Once you are discharged, your primary care physician will handle any further medical issues. Please note that no refills for any discharge medications will be authorized once you are discharged, as it is imperative that you return to your primary care physician (or establish a relationship with a primary care physician if you do not have one) for your aftercare needs so that they can reassess your need for medications and monitor your lab values.  You were cared for by a hospitalist during your hospital stay. If you have any questions about your discharge medications or the care you received while you were in  the hospital after you are discharged, you can call the unit and asked to speak with the hospitalist on call if the hospitalist that took care of you is not available. Once you are discharged, your primary care physician will handle any further medical issues. Please note that NO REFILLS for any discharge medications will be authorized once you are discharged, as it is imperative that you return to your primary care physician (or establish a relationship with a primary care physician if you do not have one) for your aftercare needs so that they can reassess your need for medications and monitor your lab values.  Please request  your Prim.MD to go over all Hospital Tests and Procedure/Radiological results at the follow up, please get all Hospital records sent to your Prim MD by signing hospital release before you go home.  Get CBC, CMP, 2 view Chest X ray checked  by Primary MD during your next visit or SNF MD in 5-7 days ( we routinely change or add medications that can affect your baseline labs and fluid status, therefore we recommend that you get the mentioned basic workup next visit with your PCP, your PCP may decide not to get them or add new tests based on their clinical decision)  On your next visit with your primary care physician please Get Medicines reviewed and adjusted.  If you experience worsening of your admission symptoms, develop shortness of breath, life threatening emergency, suicidal or homicidal thoughts you must seek medical attention immediately by calling 911 or calling your MD immediately  if symptoms less severe.  You Must read complete instructions/literature along with all the possible adverse reactions/side effects for all the Medicines you take and that have been prescribed to you. Take any new Medicines after you have completely understood and accpet all the possible adverse reactions/side effects.   Do not drive, operate heavy machinery, perform activities at heights,  swimming or participation in water activities or provide baby sitting services if your were admitted for syncope or siezures until you have seen by Primary MD or a Neurologist and advised to do so again.  Do not drive when taking Pain medications.   Procedures/Studies: CT ABDOMEN PELVIS W CONTRAST Result Date: 08/24/2024 EXAM: CT ABDOMEN AND PELVIS WITH CONTRAST 08/24/2024 05:09:24 AM TECHNIQUE: CT of the abdomen and pelvis was performed with the administration of 75 mL of iohexol  (OMNIPAQUE ) 350 MG/ML injection. Multiplanar reformatted images are provided for review. Automated exposure control, iterative reconstruction, and/or weight-based adjustment of the mA/kV was utilized to reduce the radiation dose to as low as reasonably achievable. COMPARISON: CT of the abdomen and pelvis dated 08/18/2024. CLINICAL HISTORY: Polytrauma, blunt. FINDINGS: LOWER CHEST: Healing subacute fractures of the left posterior 9th and 10th ribs. LIVER: The liver is unremarkable. GALLBLADDER AND BILE DUCTS: Gallbladder is unremarkable. No biliary ductal dilatation. SPLEEN: No acute abnormality. PANCREAS: No acute abnormality. ADRENAL GLANDS: No acute abnormality. KIDNEYS, URETERS AND BLADDER: No stones in the kidneys or ureters. No hydronephrosis. No perinephric or periureteral stranding. Urinary bladder is unremarkable. GI AND BOWEL: Stomach demonstrates no acute abnormality. There is no bowel obstruction. PERITONEUM AND RETROPERITONEUM: No ascites. No free air. VASCULATURE: Aorta is normal in caliber. There are extensive atheromatous calcifications involving the aortoiliac arteries. LYMPH NODES: No lymphadenopathy. REPRODUCTIVE ORGANS: There is moderate prostatomegaly, as before. BONES AND SOFT TISSUES: A subtle nondisplaced fracture of the T9 vertebral body is again demonstrated. The patient is status post interbody fusion and bilateral posterolateral spinal fixation at L3-L4, L4-L5 and L5-S1. Decompression laminectomy changes  are also present. No focal soft tissue abnormality. IMPRESSION: 1. Healing subacute fractures of the left posterior ninth and 10th ribs. 2. Subtle nondisplaced fracture of the T9 vertebral body. 3. Status post interbody fusion and bilateral posterolateral spinal fixation at L3-4, L4-5, and L5-S1 with decompression laminectomy changes. 4. Extensive atheromatous calcifications involving the aortoiliac arteries. 5. Moderate prostatomegaly, as before. Electronically signed by: Evalene Coho MD MD 08/24/2024 05:39 AM EST RP Workstation: [id number]   CT Angio Chest PE W and/or Wo Contrast Result Date: 08/24/2024 EXAM: CTA of the Chest without and with contrast for PE 08/24/2024 05:09:24 AM TECHNIQUE:  CTA of the chest was performed without and with the administration of 75 mL of iohexol  (OMNIPAQUE ) 350 MG/ML injection. Multiplanar reformatted images are provided for review. MIP images are provided for review. Automated exposure control, iterative reconstruction, and/or weight based adjustment of the mA/kV was utilized to reduce the radiation dose to as low as reasonably achievable. COMPARISON: CT of the chest dated 08/18/2024. CLINICAL HISTORY: Syncope/presyncope, cerebrovascular cause suspected. FINDINGS: PULMONARY ARTERIES: Pulmonary arteries are adequately opacified for evaluation. No pulmonary embolism. Main pulmonary artery is normal in caliber. MEDIASTINUM: The heart is mildly enlarged and there is calcific coronary artery disease and a cardiac pacemaker present. There is mild fusiform aneurysmal dilatation of the ascending thoracic aorta, which again measures approximately 4.3 cm in cross-sectional diameter. LYMPH NODES: No mediastinal, hilar or axillary lymphadenopathy. LUNGS AND PLEURA: Mild paraseptal emphysema. New patchy peribronchovascular opacities present posteriorly within the right upper lobe, likely infectious or inflammatory. Ground-glass opacities present independently within the lower lobes  bilaterally. The nondependent lungs are clear. No pleural effusion or pneumothorax. UPPER ABDOMEN: Limited images of the upper abdomen are unremarkable. SOFT TISSUES AND BONES: A subtle acute/subacute fracture of the T9 vertebral body is again demonstrated. No acute soft tissue abnormality. IMPRESSION: 1. No evidence of pulmonary embolus. 2. New patchy bronchovascular opacities in the posterior right upper lobe, likely infectious or inflammatory. 3. Ground-glass opacities in the lower lobes bilaterally. 4. Mild fusiform aneurysmal dilatation of the ascending thoracic aorta, measuring approximately 4.3 cm in cross-sectional diameter. 5. Subtle acute/subacute fracture of the T9 vertebral body. 6. Pulmonary emphysema is an independent risk factor for lung cancer. Recommend consideration for evaluation for a low-dose CT lung cancer screening program. Electronically signed by: Evalene Coho MD 08/24/2024 05:29 AM EST RP Workstation: [id number]   DG Chest 2 View Result Date: 08/24/2024 EXAM: 2 VIEW(S) XRAY OF THE CHEST 08/24/2024 12:41:00 AM COMPARISON: 12/28/2023 CLINICAL HISTORY: sob FINDINGS: LUNGS AND PLEURA: No focal pulmonary opacity. No pleural effusion. No pneumothorax. HEART AND MEDIASTINUM: Aortic atherosclerosis (ICD10-I70.0). Left pacer in place. Mild cardiomegaly. BONES AND SOFT TISSUES: Thoracic degenerative changes. IMPRESSION: 1. No acute cardiopulmonary abnormality. 2. Mild cardiomegaly. 3. Left pacer in place. Electronically signed by: Dorethia Molt MD MD 08/24/2024 12:46 AM EST RP Workstation: [id number]   CT CHEST ABDOMEN PELVIS WO CONTRAST Result Date: 08/18/2024 EXAM: CT CHEST, ABDOMEN AND PELVIS WITHOUT CONTRAST 08/18/2024 03:33:00 AM TECHNIQUE: CT of the chest, abdomen and pelvis was performed without the administration of intravenous contrast. Multiplanar reformatted images are provided for review. Automated exposure control, iterative reconstruction, and/or weight based adjustment of the  mA/kV was utilized to reduce the radiation dose to as low as reasonably achievable. COMPARISON: Chest, abdomen, and pelvis CT without contrast 06/16/2024, portable chest 12/28/2023, AP chest 12/23/2023, and CT abdomen and pelvis with IV contrast 07/07/2023. CLINICAL HISTORY: Recent fall with shortness of breath. FINDINGS: CHEST: MEDIASTINUM AND LYMPH NODES: Cardiac size is normal. There is a left chest dual lead pacing system with aid wiring, stable wire insertions. There are 3-vessel coronary calcifications, greatest in the LAD. No pericardial effusion. The central airways are clear. Aortic atherosclerosis and tortuosity. The mid ascending aorta again measures 4.3 cm (reference series 8, image 116). Annual imaging follow-up recommended. The pulmonary arteries and veins are normal in caliber. No mediastinal, hilar or axillary lymphadenopathy. LUNGS AND PLEURA: Diffuse bronchial thickening. Mild paraseptal and centrilobular emphysematous changes in the upper lobes. Posterior atelectasis of the lower lobes. Linear scarlike opacity in the medial left lower lobe. No focal consolidation  or pulmonary edema. No contusion. No pleural effusion. No pneumothorax. ABDOMEN AND PELVIS: LIVER: Unremarkable. GALLBLADDER AND BILE DUCTS: The gallbladder contains layering sludge or tiny noncalcified stones. There is no wall thickening. No biliary ductal dilatation. SPLEEN: Mildly enlarged spleen measuring 13.7 cm. There is homogeneous noncontrast attenuation without evidence of acute injury or mass. PANCREAS: No acute abnormality. ADRENAL GLANDS: No adrenal mass or hemorrhage. KIDNEYS, URETERS AND BLADDER: No contour deforming mass of the kidneys without contrast. Tiny nonobstructive calyceal stones with one in the lower pole of each kidney. No obstructing stone or hydronephrosis. Mild chronic perinephric stranding. No stones in the ureters. No periureteral stranding. Severely enlarged prostate protruding into the bladder, with median  lobe hypertrophy predominating. Maximal diameter 6.9 cm. The bladder is slightly thickened, probably due to hypertrophy and chronic bladder outlet obstruction. There are no inflammatory changes around the bladder. GI AND BOWEL: Stomach demonstrates no acute abnormality. The appendix is normal. There is colonic diverticulosis without evidence of diverticulitis. There is no bowel obstruction. REPRODUCTIVE ORGANS: Prostate findings are described under KIDNEYS, URETERS AND BLADDER. Other visualized reproductive organs show no acute abnormality. PERITONEUM AND RETROPERITONEUM: No ascites. No free air. No free hemorrhage, localizing collections or inflammatory change. VASCULATURE: Aorta is normal in caliber. Aortic and branch vessel atherosclerosis. No aneurysm. ABDOMINAL AND PELVIS LYMPH NODES: No lymphadenopathy. BONES AND SOFT TISSUES: * **Chest:** Subacute, partially healed fractures of the posterior left 9th and 10th ribs. No acute displaced rib fracture is seen. Osteopenia. Multilevel thoracic spine bridging enthesopathy. Acute oblique fracture through the anterior aspect of the T9 vertebral body with mild distraction, including fracture through the enthesopathic bone extending from anteriorly to the right. The fracture through the T9 body extends from the anterosuperior corner of the vertebral body through the inferior endplate along the central disc interface. No fracture transmission into the pedicles and posterior elements is seen. Mild thickening of the paraspinal soft tissues at this level but no evidence of spinal canal hematoma. No further evidence of thoracic level fractures, including the sternum and visible shoulder girdles. * **Abdomen and Pelvis:** Bilateral moderate hip arthrosis. L3 through S1 dorsal fusion construct with laminectomies and interbody hardware with solid arthrodesis. No fracture is seen at the levels of the abdomen and pelvis. No focal soft tissue abnormality in the abdomen and pelvis.  IMPRESSION: 1. Acute oblique fracture through the anterior aspect of the T9 vertebral body with mild distraction, including fracture through the enthesopathic bone extending from anteriorly to the right. 2. No fracture transmission into the pedicles and posterior elements. Mild thickening of the paraspinal soft tissues at this level without evidence of spinal canal hematoma. 3. Subacute, partially healed fractures of the posterior left 9th and 10th ribs. No acute displaced rib fracture is seen. 4. No pneumothorax, pleural effusion, or pulmonary contusion. 5. Mid ascending aorta measures 4.3 cm. Annual imaging follow-up recommended. . 6. Mild upper lobe emphysema. Pulmonary emphysema is an independent risk factor for lung cancer. Recommend consideration for evaluation for a low-dose CT lung cancer screening program. 7. Severe prostatomegaly. 8. Aortic and coronary artery atherosclerosis. Electronically signed by: Francis Quam MD 08/18/2024 04:21 AM EST RP Workstation: [id number]   DG Lumbar Spine Complete Result Date: 08/18/2024 EXAM: 4 VIEW(S) XRAY OF THE LUMBAR SPINE 08/18/2024 12:18:00 AM COMPARISON: 06/28/2019. CLINICAL HISTORY: TRAUMA. FINDINGS: LUMBAR SPINE: BONES: Vertebral body heights are maintained. Alignment is normal. Posterior fusion from L3 to S1. No hardware complicating feature. No fracture. DISCS AND DEGENERATIVE CHANGES: Diffuse degenerative facet disease throughout  the lumbar spine. SOFT TISSUES: No acute abnormality. IMPRESSION: 1. No acute bony abnormality. Electronically signed by: Franky Crease MD 08/18/2024 12:28 AM EST RP Workstation: [id number]   DG Thoracic Spine 2 View Result Date: 08/18/2024 EXAM: 2 VIEW(S) XRAY OF THE THORACIC SPINE 08/18/2024 12:16:00 AM COMPARISON: None available. CLINICAL HISTORY: TRAUMA FINDINGS: BONES: Vertebral body heights are maintained. Alignment is normal. DISCS AND DEGENERATIVE CHANGES: Flowing anterior osteophytes. SOFT TISSUES: The visualized lungs are  clear. IMPRESSION: 1. No evidence of acute traumatic injury. Electronically signed by: Franky Crease MD 08/18/2024 12:27 AM EST RP Workstation: [id number]   CT Cervical Spine Wo Contrast Result Date: 08/17/2024 EXAM: CT CERVICAL SPINE WITHOUT CONTRAST 08/17/2024 10:15:17 PM TECHNIQUE: CT of the cervical spine was performed without the administration of intravenous contrast. Multiplanar reformatted images are provided for review. Automated exposure control, iterative reconstruction, and/or weight based adjustment of the mA/kV was utilized to reduce the radiation dose to as low as reasonably achievable. COMPARISON: 06/16/2024 CLINICAL HISTORY: Polytrauma, blunt. FINDINGS: BONES AND ALIGNMENT: No acute fracture or traumatic malalignment. DEGENERATIVE CHANGES: Degenerative disc disease most pronounced from C4-C5 through C6-C7. Mild bilateral degenerative facet disease. SOFT TISSUES: No prevertebral soft tissue swelling. IMPRESSION: 1. No acute traumatic injury. 2. Multilevel degenerative changes. Electronically signed by: Franky Crease MD 08/17/2024 10:24 PM EST RP Workstation: [id number]   CT Head Wo Contrast Result Date: 08/17/2024 EXAM: CT HEAD WITHOUT CONTRAST 08/17/2024 10:15:17 PM TECHNIQUE: CT of the head was performed without the administration of intravenous contrast. Automated exposure control, iterative reconstruction, and/or weight based adjustment of the mA/kV was utilized to reduce the radiation dose to as low as reasonably achievable. COMPARISON: 06/16/2024 CLINICAL HISTORY: Head trauma, moderate-severe FINDINGS: BRAIN AND VENTRICLES: No acute hemorrhage. No evidence of acute infarct. No hydrocephalus. No extra-axial collection. No mass effect or midline shift. Atrophy and chronic small vessel disease throughout the deep white matter. ORBITS: No acute abnormality. SINUSES: No acute abnormality. SOFT TISSUES AND SKULL: Posterior scalp hematoma. No skull fracture. IMPRESSION: 1. No acute intracranial  abnormality. 2. Posterior scalp hematoma. Electronically signed by: Franky Crease MD 08/17/2024 10:21 PM EST RP Workstation: [id number]     The results of significant diagnostics from this hospitalization (including imaging, microbiology, ancillary and laboratory) are listed below for reference.     Microbiology: Recent Results (from the past 240 hours)  Blood culture (routine x 2)     Status: None (Preliminary result)   Collection Time: 08/24/24  6:39 AM   Specimen: BLOOD RIGHT HAND  Result Value Ref Range Status   Specimen Description BLOOD RIGHT HAND  Final   Special Requests   Final    BOTTLES DRAWN AEROBIC AND ANAEROBIC Blood Culture adequate volume   Culture   Final    NO GROWTH 3 DAYS Performed at [facility], [address], [place], [place] [address]    Report Status PENDING  Incomplete  Blood culture (routine x 2)     Status: None (Preliminary result)   Collection Time: 08/24/24  6:48 AM   Specimen: BLOOD LEFT HAND  Result Value Ref Range Status   Specimen Description BLOOD LEFT HAND  Final   Special Requests   Final    BOTTLES DRAWN AEROBIC AND ANAEROBIC Blood Culture adequate volume   Culture   Final    NO GROWTH 3 DAYS Performed at [facility], [address], [place], [place] [address]    Report Status PENDING  Incomplete  MRSA Next Gen by PCR, Nasal  Status: Abnormal   Collection Time: 08/24/24  8:02 PM   Specimen: Nasal Mucosa; Nasal Swab  Result Value Ref Range Status   MRSA by PCR Next Gen DETECTED (A) NOT DETECTED Final    Comment: RESULTS CALLED TO, READ BACK BY AND VERIFIED WITH: RN D.TEMBERTON ON 08/25/24 AT 0017 BY NM (NOTE) The GeneXpert MRSA Assay (FDA approved for NASAL specimens only), is one component of a comprehensive MRSA colonization surveillance program. It is not intended to diagnose MRSA infection nor to guide or monitor treatment for MRSA infections. Test performance is not FDA approved in patients less than 9  years old. Performed at [facility], [address] [place], [place] [address]      Labs: BNP (last 3 results) Recent Labs    01/19/24 1603 03/09/24 1050 07/07/24 1036  BNP 212.6* 268.9* 79.3   Basic Metabolic Panel: Recent Labs  Lab 08/24/24 0030 08/25/24 0240 08/26/24 0250 08/27/24 0854  NA 136 135 131* 133*  K 4.8 5.7* 4.9 4.6  CL 102 101 97* 99  CO2 23 23 22 22   GLUCOSE 103* 88 107* 103*  BUN 28* 32* 44* 43*  CREATININE 1.75* 1.75* 2.04* 1.71*  CALCIUM  9.5 9.5 9.0 9.1  MG  --   --  2.4  --   PHOS  --   --  3.7  --    Liver Function Tests: Recent Labs  Lab 08/24/24 0030 08/25/24 0240  AST 38 35  ALT 15 13  ALKPHOS 143* 145*  BILITOT 0.7 0.5  PROT 8.1 8.0  ALBUMIN  3.9 3.8   No results for input(s): LIPASE, AMYLASE in the last 168 hours. No results for input(s): AMMONIA in the last 168 hours. CBC: Recent Labs  Lab 08/24/24 0030 08/25/24 0240 08/26/24 0250  WBC 5.2 4.0 6.3  HGB 15.2 14.4 12.9*  HCT 47.5 45.0 39.7  MCV 89.8 89.3 86.7  PLT 109* 113* 118*   Cardiac Enzymes: No results for input(s): CKTOTAL, CKMB, CKMBINDEX, TROPONINI in the last 168 hours. BNP: Invalid input(s): POCBNP CBG: Recent Labs  Lab 08/26/24 0812 08/26/24 1219 08/26/24 1636 08/26/24 2142 08/27/24 0815  GLUCAP 116* 112* 114* 124* 97   D-Dimer No results for input(s): DDIMER in the last 72 hours. Hgb A1c No results for input(s): HGBA1C in the last 72 hours. Lipid Profile No results for input(s): CHOL, HDL, LDLCALC, TRIG, CHOLHDL, LDLDIRECT in the last 72 hours. Thyroid  function studies Recent Labs    08/25/24 1013  TSH 1.030   Anemia work up No results for input(s): VITAMINB12, FOLATE, FERRITIN, TIBC, IRON, RETICCTPCT in the last 72 hours. Urinalysis    Component Value Date/Time   COLORURINE YELLOW 06/17/2024 0430   APPEARANCEUR CLEAR 06/17/2024 0430   APPEARANCEUR Clear 12/16/2022 1150   LABSPEC 1.009  06/17/2024 0430   PHURINE 6.0 06/17/2024 0430   GLUCOSEU >=500 (A) 06/17/2024 0430   HGBUR SMALL (A) 06/17/2024 0430   BILIRUBINUR NEGATIVE 06/17/2024 0430   BILIRUBINUR Negative 12/16/2022 1150   KETONESUR NEGATIVE 06/17/2024 0430   PROTEINUR NEGATIVE 06/17/2024 0430   UROBILINOGEN 0.2 12/31/2010 1003   NITRITE NEGATIVE 06/17/2024 0430   LEUKOCYTESUR NEGATIVE 06/17/2024 0430   Sepsis Labs Recent Labs  Lab 08/24/24 0030 08/25/24 0240 08/26/24 0250  WBC 5.2 4.0 6.3   Microbiology Recent Results (from the past 240 hours)  Blood culture (routine x 2)     Status: None (Preliminary result)   Collection Time: 08/24/24  6:39 AM   Specimen: BLOOD RIGHT  HAND  Result Value Ref Range Status   Specimen Description BLOOD RIGHT HAND  Final   Special Requests   Final    BOTTLES DRAWN AEROBIC AND ANAEROBIC Blood Culture adequate volume   Culture   Final    NO GROWTH 3 DAYS Performed at [facility], [address], [place], [place] [address]    Report Status PENDING  Incomplete  Blood culture (routine x 2)     Status: None (Preliminary result)   Collection Time: 08/24/24  6:48 AM   Specimen: BLOOD LEFT HAND  Result Value Ref Range Status   Specimen Description BLOOD LEFT HAND  Final   Special Requests   Final    BOTTLES DRAWN AEROBIC AND ANAEROBIC Blood Culture adequate volume   Culture   Final    NO GROWTH 3 DAYS Performed at [facility], [address], [place], [place] [address]    Report Status PENDING  Incomplete  MRSA Next Gen by PCR, Nasal     Status: Abnormal   Collection Time: 08/24/24  8:02 PM   Specimen: Nasal Mucosa; Nasal Swab  Result Value Ref Range Status   MRSA by PCR Next Gen DETECTED (A) NOT DETECTED Final    Comment: RESULTS CALLED TO, READ BACK BY AND VERIFIED WITH: RN D.TEMBERTON ON 08/25/24 AT 0017 BY NM (NOTE) The GeneXpert MRSA Assay (FDA approved for NASAL specimens only), is one component of a comprehensive MRSA colonization  surveillance program. It is not intended to diagnose MRSA infection nor to guide or monitor treatment for MRSA infections. Test performance is not FDA approved in patients less than 67 years old. Performed at [facility], [address] [place], [place] [address]      Time coordinating discharge:  I have spent 35 minutes face to face with the patient and on the ward discussing the patients care, assessment, plan and disposition with other care givers. >50% of the time was devoted counseling the patient about the risks and benefits of treatment/Discharge disposition and coordinating care.   SIGNED:   Burgess JAYSON Dare, MD  [facility] 08/27/2024, 10:52 AM   If 7PM-7AM, please contact night-coverage      [1] No Known Allergies

## 2024-08-27 NOTE — Progress Notes (Signed)
 Occupational Therapy Treatment Patient Details Name: Edward Mullen MRN: [id number] DOB: Apr 24, 1956 Today's Date: 08/27/2024   History of present illness Pt is 69 yo presenting to [facility] on 1/12 due to back pain and cough following a fall. Pt was placed on 3L O2 via [place] in the ER due to 68% O2 sats. CT with findings of T9 vertebrae sub acute fractures and healing subacute fractures of L posterior 9th and 10th ribs. PMH includes: CKD III, CHF, polysubstance abuse, BPH, vtach, arrest requiring ICD placement, CAD s/p stent placement.   OT comments  Patient received in supine and eager to participate with OT and OOB. Patient demonstrating gains with bed mobility and min assist for donning TLSO brace from mod assist.  Patient performed mobility and transfer training with CGA. Patient able to demonstrate figure 4 for LB dressing and declined AE training. Discharge recommendations continue to be appropriate for home with HHOT to follow.       If plan is discharge home, recommend the following:  A little help with bathing/dressing/bathroom;Assistance with cooking/housework;Assist for transportation   Equipment Recommendations  None recommended by OT    Recommendations for Other Services      Precautions / Restrictions Precautions Precautions: Fall;Back Precaution Booklet Issued: No (verbally reviewed) Recall of Precautions/Restrictions: Intact Precaution/Restrictions Comments: able to recall 3/3 precautions Required Braces or Orthoses: Spinal Brace Spinal Brace: Thoracolumbosacral orthotic Restrictions Weight Bearing Restrictions Per Provider Order: No       Mobility Bed Mobility Overal bed mobility: Needs Assistance Bed Mobility: Rolling, Sidelying to Sit Rolling: Supervision Sidelying to sit: Supervision       General bed mobility comments: increased time and effort    Transfers Overall transfer level: Needs assistance Equipment used: Rolling walker (2 wheels) Transfers: Sit  to/from Stand, Bed to chair/wheelchair/BSC Sit to Stand: Contact guard assist           General transfer comment: cues for hand placement and CGA for sit to stand and mobility     Balance Overall balance assessment: Needs assistance Sitting-balance support: No upper extremity supported, Feet supported Sitting balance-Leahy Scale: Good Sitting balance - Comments: no LOB on EOB   Standing balance support: Single extremity supported, Bilateral upper extremity supported, During functional activity Standing balance-Leahy Scale: Fair Standing balance comment: reliant on at least one extemity support when standing                           ADL either performed or assessed with clinical judgement   ADL Overall ADL's : Needs assistance/impaired     Grooming: Supervision/safety;Standing           Upper Body Dressing : Minimal assistance;Sitting Upper Body Dressing Details (indicate cue type and reason): assist for donning TLSO brace EOB Lower Body Dressing: Moderate assistance Lower Body Dressing Details (indicate cue type and reason): simulated figure four, declined AE training Toilet Transfer: Contact guard assist;Rolling walker (2 wheels) Toilet Transfer Details (indicate cue type and reason): simulated                Extremity/Trunk Assessment              Vision       Perception     Praxis     Communication Communication Communication: No apparent difficulties   Cognition Arousal: Alert Behavior During Therapy: WFL for tasks assessed/performed Cognition: No apparent impairments             OT -  Cognition Comments: able to recall 3/3 back precautions                 Following commands: Intact        Cueing   Cueing Techniques: Verbal cues, Gestural cues  Exercises      Shoulder Instructions       General Comments VSS    Pertinent Vitals/ Pain       Pain Assessment Pain Assessment: Faces Faces Pain Scale: Hurts  little more Pain Location: back Pain Descriptors / Indicators: Discomfort, Aching Pain Intervention(s): Limited activity within patient's tolerance, Monitored during session, Repositioned, Patient requesting pain meds-RN notified  Home Living                                          Prior Functioning/Environment              Frequency  Min 2X/week        Progress Toward Goals  OT Goals(current goals can now be found in the care plan section)  Progress towards OT goals: Progressing toward goals  Acute Rehab OT Goals Patient Stated Goal: to get better OT Goal Formulation: With patient Time For Goal Achievement: 09/08/24 Potential to Achieve Goals: Good ADL Goals Pt Will Perform Grooming: with modified independence;standing Pt Will Perform Upper Body Dressing: with modified independence;sitting (TLSO) Pt Will Perform Lower Body Dressing: with modified independence;with adaptive equipment;sitting/lateral leans;sit to/from stand Pt Will Transfer to Toilet: with modified independence;ambulating;regular height toilet;grab bars Pt Will Perform Toileting - Clothing Manipulation and hygiene: with modified independence;sitting/lateral leans;sit to/from stand Additional ADL Goal #1: Pt will recall back precautions 100% of the time during ADLs/functional mobility.  Plan      Co-evaluation                 AM-PAC OT 6 Clicks Daily Activity     Outcome Measure   Help from another person eating meals?: None Help from another person taking care of personal grooming?: A Little Help from another person toileting, which includes using toliet, bedpan, or urinal?: A Little Help from another person bathing (including washing, rinsing, drying)?: A Lot Help from another person to put on and taking off regular upper body clothing?: A Lot Help from another person to put on and taking off regular lower body clothing?: A Lot 6 Click Score: 16    End of Session  Equipment Utilized During Treatment: Rolling walker (2 wheels);Back brace  OT Visit Diagnosis: Unsteadiness on feet (R26.81);Other abnormalities of gait and mobility (R26.89);History of falling (Z91.81)   Activity Tolerance Patient tolerated treatment well   Patient Left in chair;with call bell/phone within reach;with chair alarm set   Nurse Communication Mobility status        Time: 9284-9265 OT Time Calculation (min): 19 min  Charges: OT General Charges $OT Visit: 1 Visit OT Treatments $Self Care/Home Management : 8-22 mins  Dick Laine, OTA Acute Rehabilitation Services  Office [phone]   Jeb LITTIE Laine 08/27/2024, 8:11 AM

## 2024-08-29 LAB — CULTURE, BLOOD (ROUTINE X 2)
Culture: NO GROWTH
Culture: NO GROWTH
Special Requests: ADEQUATE
Special Requests: ADEQUATE

## 2024-09-20 ENCOUNTER — Encounter

## 2024-10-14 ENCOUNTER — Ambulatory Visit: Payer: Self-pay

## 2024-10-14 ENCOUNTER — Encounter

## 2024-11-04 ENCOUNTER — Ambulatory Visit (HOSPITAL_COMMUNITY)

## 2024-12-20 ENCOUNTER — Encounter

## 2025-01-13 ENCOUNTER — Ambulatory Visit: Payer: Self-pay

## 2025-01-13 ENCOUNTER — Encounter

## 2025-03-21 ENCOUNTER — Encounter

## 2025-04-14 ENCOUNTER — Ambulatory Visit: Payer: Self-pay

## 2025-04-14 ENCOUNTER — Encounter

## 2025-06-20 ENCOUNTER — Encounter

## 2025-07-14 ENCOUNTER — Encounter

## 2025-09-19 ENCOUNTER — Encounter

## 2025-10-13 ENCOUNTER — Encounter

## 2025-12-19 ENCOUNTER — Encounter
